# Patient Record
Sex: Male | Born: 1945 | Race: White | Hispanic: No | Smoking: Former smoker
Health system: Southern US, Community
[De-identification: ages and names within clinical notes are randomized; demographics above are authoritative.]

## PROBLEM LIST (undated history)

## (undated) DIAGNOSIS — K649 Unspecified hemorrhoids: Secondary | ICD-10-CM

## (undated) DIAGNOSIS — Z978 Presence of other specified devices: Secondary | ICD-10-CM

## (undated) DIAGNOSIS — E119 Type 2 diabetes mellitus without complications: Secondary | ICD-10-CM

## (undated) DIAGNOSIS — M1990 Unspecified osteoarthritis, unspecified site: Secondary | ICD-10-CM

## (undated) DIAGNOSIS — R059 Cough, unspecified: Secondary | ICD-10-CM

## (undated) DIAGNOSIS — I10 Essential (primary) hypertension: Secondary | ICD-10-CM

## (undated) DIAGNOSIS — M4800 Spinal stenosis, site unspecified: Secondary | ICD-10-CM

## (undated) DIAGNOSIS — N390 Urinary tract infection, site not specified: Secondary | ICD-10-CM

## (undated) DIAGNOSIS — I4891 Unspecified atrial fibrillation: Secondary | ICD-10-CM

## (undated) DIAGNOSIS — R339 Retention of urine, unspecified: Secondary | ICD-10-CM

## (undated) DIAGNOSIS — J449 Chronic obstructive pulmonary disease, unspecified: Secondary | ICD-10-CM

## (undated) DIAGNOSIS — E785 Hyperlipidemia, unspecified: Secondary | ICD-10-CM

## (undated) DIAGNOSIS — J309 Allergic rhinitis, unspecified: Secondary | ICD-10-CM

## (undated) DIAGNOSIS — M19019 Primary osteoarthritis, unspecified shoulder: Secondary | ICD-10-CM

## (undated) DIAGNOSIS — F419 Anxiety disorder, unspecified: Secondary | ICD-10-CM

## (undated) DIAGNOSIS — I480 Paroxysmal atrial fibrillation: Secondary | ICD-10-CM

## (undated) HISTORY — DX: Type 2 diabetes mellitus without complications: E11.9

## (undated) HISTORY — DX: Unspecified osteoarthritis, unspecified site: M19.90

## (undated) HISTORY — DX: Primary osteoarthritis, unspecified shoulder: M19.019

## (undated) HISTORY — PX: BACK SURGERY: SHX140

## (undated) HISTORY — PX: PARTIAL HIP ARTHROPLASTY: SHX733

## (undated) HISTORY — PX: TONSILLECTOMY: SUR1361

## (undated) HISTORY — DX: Cough, unspecified: R05.9

## (undated) HISTORY — DX: Anxiety disorder, unspecified: F41.9

## (undated) HISTORY — PX: HERNIA REPAIR: SHX51

## (undated) HISTORY — DX: Essential (primary) hypertension: I10

## (undated) HISTORY — DX: Hyperlipidemia, unspecified: E78.5

## (undated) HISTORY — DX: Unspecified atrial fibrillation: I48.91

## (undated) HISTORY — DX: Allergic rhinitis, unspecified: J30.9

## (undated) HISTORY — DX: Retention of urine, unspecified: R33.9

## (undated) HISTORY — PX: KNEE SURGERY: SHX244

## (undated) HISTORY — DX: Spinal stenosis, site unspecified: M48.00

## (undated) HISTORY — PX: OTHER SURGICAL HISTORY: SHX169

## (undated) HISTORY — PX: APPENDECTOMY: SHX54

## (undated) HISTORY — DX: Urinary tract infection, site not specified: N39.0

## (undated) HISTORY — DX: Paroxysmal atrial fibrillation: I48.0

## (undated) HISTORY — DX: Presence of other specified devices: Z97.8

## (undated) HISTORY — DX: Unspecified hemorrhoids: K64.9

## (undated) HISTORY — PX: ELBOW SURGERY: SHX618

---

## 2015-04-24 DIAGNOSIS — G8929 Other chronic pain: Secondary | ICD-10-CM | POA: Insufficient documentation

## 2015-04-24 DIAGNOSIS — S66812A Strain of other specified muscles, fascia and tendons at wrist and hand level, left hand, initial encounter: Secondary | ICD-10-CM | POA: Insufficient documentation

## 2015-04-24 DIAGNOSIS — M19032 Primary osteoarthritis, left wrist: Secondary | ICD-10-CM | POA: Insufficient documentation

## 2015-05-31 DIAGNOSIS — G8929 Other chronic pain: Secondary | ICD-10-CM | POA: Insufficient documentation

## 2015-05-31 DIAGNOSIS — I4891 Unspecified atrial fibrillation: Secondary | ICD-10-CM | POA: Insufficient documentation

## 2015-05-31 DIAGNOSIS — J441 Chronic obstructive pulmonary disease with (acute) exacerbation: Secondary | ICD-10-CM | POA: Insufficient documentation

## 2015-05-31 DIAGNOSIS — I10 Essential (primary) hypertension: Secondary | ICD-10-CM | POA: Insufficient documentation

## 2015-05-31 DIAGNOSIS — G894 Chronic pain syndrome: Secondary | ICD-10-CM | POA: Insufficient documentation

## 2015-05-31 DIAGNOSIS — I480 Paroxysmal atrial fibrillation: Secondary | ICD-10-CM | POA: Insufficient documentation

## 2015-05-31 DIAGNOSIS — J449 Chronic obstructive pulmonary disease, unspecified: Secondary | ICD-10-CM | POA: Insufficient documentation

## 2015-06-05 DIAGNOSIS — I340 Nonrheumatic mitral (valve) insufficiency: Secondary | ICD-10-CM | POA: Insufficient documentation

## 2015-06-26 DIAGNOSIS — R269 Unspecified abnormalities of gait and mobility: Secondary | ICD-10-CM | POA: Insufficient documentation

## 2015-07-05 DIAGNOSIS — M7532 Calcific tendinitis of left shoulder: Secondary | ICD-10-CM | POA: Insufficient documentation

## 2015-07-05 DIAGNOSIS — M12812 Other specific arthropathies, not elsewhere classified, left shoulder: Secondary | ICD-10-CM | POA: Insufficient documentation

## 2015-07-05 DIAGNOSIS — M19012 Primary osteoarthritis, left shoulder: Secondary | ICD-10-CM | POA: Insufficient documentation

## 2015-08-03 DIAGNOSIS — G8929 Other chronic pain: Secondary | ICD-10-CM | POA: Insufficient documentation

## 2015-08-03 DIAGNOSIS — M25561 Pain in right knee: Secondary | ICD-10-CM | POA: Insufficient documentation

## 2015-09-28 DIAGNOSIS — M12811 Other specific arthropathies, not elsewhere classified, right shoulder: Secondary | ICD-10-CM | POA: Insufficient documentation

## 2016-01-02 DIAGNOSIS — S66919S Strain of unspecified muscle, fascia and tendon at wrist and hand level, unspecified hand, sequela: Secondary | ICD-10-CM | POA: Insufficient documentation

## 2016-10-25 DIAGNOSIS — Z09 Encounter for follow-up examination after completed treatment for conditions other than malignant neoplasm: Secondary | ICD-10-CM | POA: Insufficient documentation

## 2017-06-19 DIAGNOSIS — M1711 Unilateral primary osteoarthritis, right knee: Secondary | ICD-10-CM | POA: Insufficient documentation

## 2017-08-06 DIAGNOSIS — N401 Enlarged prostate with lower urinary tract symptoms: Secondary | ICD-10-CM | POA: Insufficient documentation

## 2018-07-14 DIAGNOSIS — R339 Retention of urine, unspecified: Secondary | ICD-10-CM | POA: Insufficient documentation

## 2018-10-16 DIAGNOSIS — N3090 Cystitis, unspecified without hematuria: Secondary | ICD-10-CM | POA: Insufficient documentation

## 2019-04-01 ENCOUNTER — Emergency Department
Admission: EM | Admit: 2019-04-01 | Discharge: 2019-04-01 | Disposition: A | Discharge status: Home / Self Care | Payer: No Typology Code available for payment source | Attending: Emergency Medicine | Admitting: Emergency Medicine

## 2019-04-01 ENCOUNTER — Other Ambulatory Visit: Payer: Self-pay

## 2019-04-01 DIAGNOSIS — R339 Retention of urine, unspecified: Secondary | ICD-10-CM | POA: Insufficient documentation

## 2019-04-01 DIAGNOSIS — J449 Chronic obstructive pulmonary disease, unspecified: Secondary | ICD-10-CM | POA: Diagnosis not present

## 2019-04-01 DIAGNOSIS — Z87891 Personal history of nicotine dependence: Secondary | ICD-10-CM | POA: Diagnosis not present

## 2019-04-01 DIAGNOSIS — N3001 Acute cystitis with hematuria: Secondary | ICD-10-CM | POA: Diagnosis not present

## 2019-04-01 HISTORY — DX: Chronic obstructive pulmonary disease, unspecified: J44.9

## 2019-04-01 LAB — URINALYSIS, COMPLETE (UACMP) WITH MICROSCOPIC
Bacteria, UA: NONE SEEN
Bilirubin Urine: NEGATIVE
Glucose, UA: NEGATIVE mg/dL
Ketones, ur: NEGATIVE mg/dL
Nitrite: POSITIVE — AB
Protein, ur: 100 mg/dL — AB
RBC / HPF: >50 RBC/hpf — ABNORMAL HIGH (ref 0–5)
Specific Gravity, Urine: 1.02 (ref 1.005–1.030)
Squamous Epithelial / HPF: NONE SEEN (ref 0–5)
WBC, UA: >50 WBC/hpf — ABNORMAL HIGH (ref 0–5)
pH: 6 (ref 5.0–8.0)

## 2019-04-01 MED ORDER — SULFAMETHOXAZOLE-TRIMETHOPRIM 800-160 MG PO TABS
1.0000 | ORAL_TABLET | Freq: Once | ORAL | Status: AC
  Administered 2019-04-01: 14:00:00 1 via ORAL
  Filled 2019-04-01: qty 1

## 2019-04-01 MED ORDER — SULFAMETHOXAZOLE-TRIMETHOPRIM 800-160 MG PO TABS: 1.0000 | ORAL_TABLET | Freq: Two times a day (BID) | ORAL | 0 refills | Status: DC

## 2019-04-01 NOTE — Discharge Instructions (Signed)
Follow discharge care instruction take medication as directed. °

## 2019-04-01 NOTE — ED Notes (Signed)
Bladder scan with no urine noted in bladder.

## 2019-04-01 NOTE — ED Triage Notes (Signed)
Pt states he has long term foley, urine has been dark lately, seeing some blood in urine. Nurse at [facility] deflated catheter balloon and catheter came out, pt states he is still having some bleeding with urination. Pt is able to urinate.

## 2019-04-01 NOTE — ED Provider Notes (Addendum)
[facility] Emergency Department Provider Note   ____________________________________________   First MD Initiated Contact with Patient 04/01/19 1145     (approximate)  I have reviewed the triage vital signs and the nursing notes.   HISTORY  Chief Complaint Hematuria    HPI Raymond Hays is a 73 y.o. male patient arrived via EMS from [facility] nursing home.  Patient the urine has been dark in the past couple days.  No suspected blood.  Patient was leaking around his Foley and the nurse deflated remove the catheter.  Patient states still having some mild bleeding with urination.  Patient state he was able to void status post catheter removal.  Patient denies pain.      Past Medical History:  Diagnosis Date  . COPD (chronic obstructive pulmonary disease) (HCC)     There are no problems to display for this patient.     Prior to Admission medications   Medication Sig Start Date End Date Taking? Authorizing Provider  sulfamethoxazole-trimethoprim (BACTRIM DS) 800-160 MG tablet Take 1 tablet by mouth 2 (two) times daily. 04/01/19   Joni Reining, PA-C    Allergies Patient has no allergy information on record.  No family history on file.  Social History Social History   Tobacco Use  . Smoking status: Former Smoker  Substance Use Topics  . Alcohol use: Not on file  . Drug use: Not on file    Review of Systems Constitutional: No fever/chills Eyes: No visual changes. ENT: No sore throat. Cardiovascular: Denies chest pain. Respiratory: Denies shortness of breath. Gastrointestinal: No abdominal pain.  No nausea, no vomiting.  No diarrhea.  No constipation. Genitourinary: Hematuria and urinary retention. Musculoskeletal: Negative for back pain. Skin: Negative for rash. Neurological: Negative for headaches, focal weakness or numbness.   ____________________________________________   PHYSICAL EXAM:  VITAL SIGNS: ED Triage Vitals  Enc  Vitals Group     BP 04/01/19 1145 109/68     Pulse Rate 04/01/19 1145 85     Resp 04/01/19 1145 (!) 22     Temp 04/01/19 1145 97.8 F (36.6 C)     Temp Source 04/01/19 1145 Oral     SpO2 04/01/19 1145 100 %     Weight 04/01/19 1146 204 lb (92.5 kg)     Height 04/01/19 1146 5\' 7"  (1.702 m)     Head Circumference --      Peak Flow --      Pain Score 04/01/19 1146 0     Pain Loc --      Pain Edu? --      Excl. in GC? --    Constitutional: Alert and oriented. Well appearing and in no acute distress. Cardiovascular: Normal rate, regular rhythm. Grossly normal heart sounds.  Good peripheral circulation. Respiratory: Normal respiratory effort.  No retractions. Lungs CTAB. Gastrointestinal: Soft and nontender. No distention. No abdominal bruits. No CVA tenderness. Genitourinary: Hematuria. Neurologic:  Normal speech and language. No gross focal neurologic deficits are appreciated. No gait instability. Skin:  Skin is warm, dry and intact. No rash noted. Psychiatric: Mood and affect are normal. Speech and behavior are normal.  ____________________________________________   LABS (all labs ordered are listed, but only abnormal results are displayed)  Labs Reviewed  URINALYSIS, COMPLETE (UACMP) WITH MICROSCOPIC - Abnormal; Notable for the following components:      Result Value   Color, Urine RED (*)    APPearance CLOUDY (*)    Hgb urine dipstick LARGE (*)  Protein, ur 100 (*)    Nitrite POSITIVE (*)    Leukocytes,Ua MODERATE (*)    RBC / HPF >50 (*)    WBC, UA >50 (*)    All other components within normal limits   ____________________________________________  EKG   ____________________________________________  RADIOLOGY  ED MD interpretation:    Official radiology report(s): No results found.  ____________________________________________   PROCEDURES  Procedure(s) performed (including Critical  Care):  Procedures   ____________________________________________   INITIAL IMPRESSION / ASSESSMENT AND PLAN / ED COURSE  As part of my medical decision making, I reviewed the following data within the [facility]      Patient presents with dislodged Foley catheter and hematuria.  Patient normally has catheter changed out every month.  Patient state he believes longer than a month since the last catheter exchange.  Physical exam shows hematuria from the meatus.  Labs are consistent with urinary tract infection.  Patient received new Foley catheter and discharge care instructions.  Take medication as directed.  Advised to follow urology in 5 to 7 days.  Return right ED if condition worsens.    Raymond Hays was evaluated in Emergency Department on 04/01/2019 for the symptoms described in the history of present illness. He was evaluated in the context of the global COVID-19 pandemic, which necessitated consideration that the patient might be at risk for infection with the SARS-CoV-2 virus that causes COVID-19. Institutional protocols and algorithms that pertain to the evaluation of patients at risk for COVID-19 are in a state of rapid change based on information released by regulatory bodies including the CDC and federal and state organizations. These policies and algorithms were followed during the patient's care in the ED.       ____________________________________________   FINAL CLINICAL IMPRESSION(S) / ED DIAGNOSES  Final diagnoses:  Urinary retention  Acute cystitis with hematuria     ED Discharge Orders         Ordered    sulfamethoxazole-trimethoprim (BACTRIM DS) 800-160 MG tablet  2 times daily     04/01/19 1307           Note:  This document was prepared using Dragon voice recognition software and may include unintentional dictation errors.    Sable Feil, PA-C 04/01/19 1314    Sable Feil, PA-C 04/01/19 1315    Duffy Bruce,  MD 04/01/19 2046

## 2019-04-01 NOTE — ED Notes (Signed)
Upon assessment, bright red blood noticed trickling from pt's penis. EDP notified, will wait on foley insertion until further direction from EDP. Pt aware.

## 2019-04-01 NOTE — ED Notes (Signed)
14 french catheter inserted, pt with bleeding from around catheter and blood in urine. Pt states that this has been going on for a while.

## 2019-09-14 ENCOUNTER — Other Ambulatory Visit: Payer: Self-pay

## 2019-09-15 ENCOUNTER — Other Ambulatory Visit: Payer: Self-pay

## 2019-09-15 ENCOUNTER — Ambulatory Visit (INDEPENDENT_AMBULATORY_CARE_PROVIDER_SITE_OTHER): Payer: No Typology Code available for payment source | Admitting: Gastroenterology

## 2019-09-15 ENCOUNTER — Encounter: Payer: Self-pay | Admitting: Gastroenterology

## 2019-09-15 ENCOUNTER — Telehealth: Payer: Self-pay

## 2019-09-15 VITALS — BP 121/82 | HR 102 | Temp 98.1°F | Ht 67.5 in | Wt 225.0 lb

## 2019-09-15 DIAGNOSIS — Z7409 Other reduced mobility: Secondary | ICD-10-CM | POA: Insufficient documentation

## 2019-09-15 DIAGNOSIS — K625 Hemorrhage of anus and rectum: Secondary | ICD-10-CM

## 2019-09-15 DIAGNOSIS — K640 First degree hemorrhoids: Secondary | ICD-10-CM

## 2019-09-15 MED ORDER — HYDROCORTISONE (PERIANAL) 2.5 % EX CREA: 1.0000 "application " | TOPICAL_CREAM | Freq: Two times a day (BID) | CUTANEOUS | 1 refills | Status: DC

## 2019-09-15 NOTE — Telephone Encounter (Signed)
Called patient Facility at [facility] [place] and Gave patient nurse the Prescription for hydrocortisone (ANUSOL-HC) 2.5 % rectal cream  Verbally. Nurse verbalized understanding and repeated the prescription.

## 2019-09-15 NOTE — Progress Notes (Signed)
Arlyss Repress, MD [address]  [address]  [place], [place] [address]  Main: [phone]  Fax: [phone]    Gastroenterology Consultation  Referring Provider:     No ref. provider found Primary Care Physician:  Patient, No Pcp Per Primary Gastroenterologist:  Dr. Arlyss Repress Reason for Consultation:     Rectal bleeding, hemorrhoids        HPI:   Raymond Hays is a 74 y.o. male referred by the [facility], [place] for consultation & management of rectal bleeding, hemorrhoids Patient is a veteran with history of A. fib on Eliquis, metabolic syndrome, COPD, bilateral lower extremity weakness, wheelchair dependent lives at long-term care facility.  Patient reports that he has been experiencing severe burning in his rectum associated with bright red blood per rectum which is scant amount.  Patient also reports that he does not know when he has a bowel movement although he experiences rectal discomfort.  He denies having advised to have a BM.  He is on a stool softener on a regular basis.  He has been using hydrocortisone suppository as needed which provides temporary relief.  He is hoping to find a permanent solution for his hemorrhoid symptoms.  He does have fecal soiling, wears depends.  Due to his sedentary lifestyle, he has developed severe skin breakdown of the bilateral buttocks.  He reports having had a colonoscopy several years ago, report not available  He states he has an appointment to see pulmonologist followed by cardiologist at the [facility]  I do not have any labs available with me  NSAIDs: None  Antiplts/Anticoagulants/Anti thrombotics: Eliquis for history of A. fib  GI Procedures: [facility] at [facility], report not available He reports having had 2 colonoscopies in his life  Past Medical History:  Diagnosis Date  . COPD (chronic obstructive pulmonary disease) (HCC)     History reviewed. No pertinent surgical history.  Current Outpatient  Medications:  .  Albuterol Sulfate (PROAIR RESPICLICK) 108 (90 Base) MCG/ACT AEPB, Inhale into the lungs., Disp: , Rfl:  .  apixaban (ELIQUIS) 5 MG TABS tablet, Take by mouth., Disp: , Rfl:  .  atorvastatin (LIPITOR) 80 MG tablet, Take by mouth., Disp: , Rfl:  .  digoxin (LANOXIN) 0.25 MG tablet, Take by mouth., Disp: , Rfl:  .  finasteride (PROSCAR) 5 MG tablet, Take 5 mg by mouth daily., Disp: , Rfl:  .  Fluticasone-Umeclidin-Vilant (TRELEGY ELLIPTA) 200-62.5-25 MCG/INH AEPB, Inhale into the lungs., Disp: , Rfl:  .  gabapentin (NEURONTIN) 400 MG capsule, Take by mouth., Disp: , Rfl:  .  insulin glargine (LANTUS) 100 UNIT/ML injection, Inject into the skin., Disp: , Rfl:  .  ipratropium (ATROVENT) 0.06 % nasal spray, Place into the nose., Disp: , Rfl:  .  metFORMIN (GLUCOPHAGE-XR) 500 MG 24 hr tablet, Take 500 mg by mouth daily with breakfast., Disp: , Rfl:  .  metoprolol succinate (TOPROL-XL) 100 MG 24 hr tablet, Take by mouth., Disp: , Rfl:  .  simethicone (MYLICON) 80 MG chewable tablet, Chew by mouth., Disp: , Rfl:  .  tamsulosin (FLOMAX) 0.4 MG CAPS capsule, Take 0.4 mg by mouth., Disp: , Rfl:  .  traMADol (ULTRAM) 50 MG tablet, Take by mouth every 6 (six) hours as needed., Disp: , Rfl:  .  traZODone (DESYREL) 100 MG tablet, Take 100 mg by mouth at bedtime., Disp: , Rfl:  .  ascorbic acid (VITAMIN C) 500 MG tablet, Take by mouth. (Patient not taking: Reported  on 09/15/2019), Disp: , Rfl:  .  calcium-vitamin D (OSCAL WITH D) 500-200 MG-UNIT TABS tablet, Take by mouth. (Patient not taking: Reported on 09/15/2019), Disp: , Rfl:  .  cetirizine (ZYRTEC) 10 MG tablet, Take by mouth. (Patient not taking: Reported on 09/15/2019), Disp: , Rfl:  .  diclofenac Sodium (VOLTAREN) 1 % GEL, Place onto the skin. (Patient not taking: Reported on 09/15/2019), Disp: , Rfl:  .  hydrocortisone (ANUSOL-HC) 2.5 % rectal cream, Place 1 application rectally 2 (two) times daily., Disp: 30 g, Rfl: 1 .  hydrocortisone  cream 0.5 %, Apply topically., Disp: , Rfl:  .  methocarbamol (ROBAXIN) 750 MG tablet, Take by mouth., Disp: , Rfl:  .  nystatin (MYCOSTATIN/NYSTOP) powder, , Disp: , Rfl:  .  omeprazole (PRILOSEC) 20 MG capsule, Take by mouth., Disp: , Rfl:  .  pantoprazole (PROTONIX) 40 MG tablet, Take by mouth., Disp: , Rfl:  .  polyethylene glycol powder (GLYCOLAX/MIRALAX) 17 GM/SCOOP powder, Take by mouth., Disp: , Rfl:  .  promethazine (PHENERGAN) 25 MG tablet, Take by mouth., Disp: , Rfl:  .  senna-docusate (SENOKOT-S) 8.6-50 MG tablet, Take by mouth., Disp: , Rfl:  .  sildenafil (VIAGRA) 100 MG tablet, Take by mouth., Disp: , Rfl:  .  sucralfate (CARAFATE) 1 g tablet, Take by mouth., Disp: , Rfl:  .  sulfamethoxazole-trimethoprim (BACTRIM DS) 800-160 MG tablet, Take 1 tablet by mouth 2 (two) times daily., Disp: 20 tablet, Rfl: 0   History reviewed. No pertinent family history.   Social History   Tobacco Use  . Smoking status: Former Research scientist (life sciences)  . Smokeless tobacco: Never Used  Substance Use Topics  . Alcohol use: Not Currently  . Drug use: Never    Allergies as of 09/15/2019  . (No Known Allergies)    Review of Systems:    All systems reviewed and negative except where noted in HPI.   Physical Exam:  BP 121/82 (BP Location: Left Arm, Patient Position: Sitting, Cuff Size: Normal)   Pulse (!) 102   Temp 98.1 F (36.7 C) (Oral)   Ht 5' 7.5" (1.715 m)   Wt 225 lb (102.1 kg) Comment: in a wheel chair the patient states he weighs this  BMI 34.72 kg/m  No LMP for male patient.  General:   Alert,  Well-developed, well-nourished, pleasant and cooperative in NAD Head:  Normocephalic and atraumatic. Eyes:  Sclera clear, no icterus.   Conjunctiva pink. Ears:  Normal auditory acuity. Nose:  No deformity, discharge, or lesions. Mouth:  No deformity or lesions,oropharynx pink & moist. Neck:  Supple; no masses or thyromegaly. Lungs:  Respirations even and unlabored.  Exertional dyspnea,  bilateral crackles Heart: Increased rate and irregular rhythm; no murmurs, clicks, rubs, or gallops. Abdomen:  Normal bowel sounds. Soft, non-tender and non-distended without masses, hepatosplenomegaly or hernias noted.  No guarding or rebound tenderness.   Rectal: Erythematous skin rash on buttocks with breakdown of the skin as well as perianal region. Decreased sphincter tone. Formed stool felt in the anal canal as well as rectal vault, stool mixed with blood Msk: Bilateral upper and lower extremity weakness Pulses: Increased pulse rate Extremities:  No clubbing or edema.  No cyanosis. Neurologic:  Alert and oriented x3;  grossly normal neurologically. Psych:  Alert and cooperative. Normal mood and affect.  Imaging Studies: Not available  Assessment and Plan:   Raymond Hays is a 74 y.o. male veteran with metabolic syndrome, COPD, A. fib on Eliquis is seen in consultation for  chronic history of rectal bleeding, rectal pain/burning, discomfort. His symptoms are likely combination of symptomatic external hemorrhoids and breakdown of the perianal skin  Discussed with him about limited treatment options given his sedentary lifestyle Trial of Anusol cream 2 times daily per rectum Avoid constipation Keep the perianal skin dry Will need to obtain clearance from cardiology for interruption of anticoagulation before and after performing band ligation His anal canal and rectal vault needs to be empty in order to perform hemorrhoid ligation We need to obtain previous colonoscopy report   Follow up in 1 to 2 months   Arlyss Repress, MD

## 2019-09-16 ENCOUNTER — Telehealth: Payer: Self-pay

## 2019-09-16 NOTE — Telephone Encounter (Signed)
[facility] [place] to get [facility] reports [facility] to [place]. Had to leave a message for call back for nurse.

## 2019-09-22 ENCOUNTER — Telehealth: Payer: Self-pay

## 2019-09-22 NOTE — Telephone Encounter (Signed)
Tonya left a voicemail stating that she would like a phone call back to schedule patient's procedure.

## 2019-09-23 ENCOUNTER — Telehealth: Payer: Self-pay

## 2019-09-23 NOTE — Telephone Encounter (Signed)
Per Dr. Allegra Lai she said that we need to get is records from the [place] which we have tried several times. She states that until we get his records we can not do anything. He does have a option if we refer him to a cardiologist in [facility] and then get blood thinner clearance. We can not do banding procedure till we have a blood thinner clearance. Called patient and patient verbalized understanding. He states that he saw his PCP yesterday at the [place] and they referred him to pulmonologist and cardiologist. He states he will see them and then see [place] for his follow up appointment

## 2019-09-23 NOTE — Telephone Encounter (Signed)
[facility] and left a message for call back

## 2019-09-23 NOTE — Telephone Encounter (Signed)
Patient has a follow up appointment with you on 12/15/2019. Patient states he can not wait till then because he has hemorrhoids flare up every 2 weeks where he is unable to get out of bed. He states that he has bleeding a severe rectal pain with it. Patient wants to know if he could do the banding procedure or recommend something for him. He is on Eliquis 5mg  do not know who prescribes this for him can not find it in his chart

## 2019-09-23 NOTE — Telephone Encounter (Signed)
I talked to patient on 09/21/2019 and informed patient that the provider wanted him to just wait till his follow up to do any procedures at this time. Patient verbalized understanding

## 2019-10-13 NOTE — Telephone Encounter (Signed)
[facility] to [facility] to [place]. Left a message for call back

## 2019-11-04 ENCOUNTER — Ambulatory Visit (INDEPENDENT_AMBULATORY_CARE_PROVIDER_SITE_OTHER): Payer: No Typology Code available for payment source | Admitting: Pulmonary Disease

## 2019-11-04 ENCOUNTER — Encounter: Payer: Self-pay | Admitting: Pulmonary Disease

## 2019-11-04 ENCOUNTER — Other Ambulatory Visit: Payer: Self-pay

## 2019-11-04 VITALS — BP 130/76 | HR 101 | Temp 97.7°F | Ht 67.5 in | Wt 234.0 lb

## 2019-11-04 DIAGNOSIS — J449 Chronic obstructive pulmonary disease, unspecified: Secondary | ICD-10-CM | POA: Diagnosis not present

## 2019-11-04 DIAGNOSIS — J984 Other disorders of lung: Secondary | ICD-10-CM

## 2019-11-04 DIAGNOSIS — E669 Obesity, unspecified: Secondary | ICD-10-CM

## 2019-11-04 DIAGNOSIS — R0600 Dyspnea, unspecified: Secondary | ICD-10-CM | POA: Diagnosis not present

## 2019-11-04 DIAGNOSIS — R29898 Other symptoms and signs involving the musculoskeletal system: Secondary | ICD-10-CM

## 2019-11-04 DIAGNOSIS — R0609 Other forms of dyspnea: Secondary | ICD-10-CM

## 2019-11-04 DIAGNOSIS — I340 Nonrheumatic mitral (valve) insufficiency: Secondary | ICD-10-CM

## 2019-11-04 MED ORDER — SPACER/AERO-HOLDING CHAMBERS DEVI: 0 refills | Status: DC

## 2019-11-04 MED ORDER — BREZTRI AEROSPHERE 160-9-4.8 MCG/ACT IN AERO: 2.0000 | INHALATION_SPRAY | Freq: Two times a day (BID) | RESPIRATORY_TRACT | 0 refills | Status: AC

## 2019-11-04 NOTE — Progress Notes (Signed)
 Subjective:    Patient ID: Raymond Hays, male    DOB: 07/15/1945, 73 y.o.   MRN: [id number]  HPI Patient is a 73-year-old remote former smoker who presents for evaluation of dyspnea.  He is referred [facility].  The patient had COVID-19 in April 2020 and since then has noted increasing issues with dyspnea.  He presents as a second opinion with regards to pulmonary issues for dyspnea.  [facility] [place] [facility].  He has noted cough that at times is "violent".  Sputum is thick sometimes hard to produce.  No significant color to it.  No hemoptysis.  Intermittent nonspecific chest pain, none recently.  The patient had pulmonary function testing performed in April 2021 showing combined restrictive and obstructive physiology diffusion capacity was moderately reduced.  It appears restrictive physiology due to obesity as his ERV is 14%.  His inspiratory and expiratory mouth pressures during the test were consistent with muscle weakness.  He is currently not using inhalers, apparently has tried Trelegy without help.  He notes that really "everything" makes his symptoms worse and "nothing" helps.  He appears significantly deconditioned he does have spinal stenosis which adds to his debility.   [facility] that shows moderate to severe mitral regurgitation by the direction of the mitral regurgitant jet it was consistent with posterior leaflet pathology.  He informs me that he is to see cardiology next.  Patient has been using oxygen with exertion however per prior records it does not appear that he has met criteria.  He cannot perform ambulatory oximetry today as he did not bring his walker and he is in a wheelchair.  Review of Systems A 10 point review of systems was performed and it is as noted above otherwise negative.  Past Medical History:  Diagnosis Date  . Allergic rhinitis    . Anxiety   . Arthritis   . Chronic indwelling Foley catheter   . COPD (chronic obstructive pulmonary disease) (HCC)   . Cough   . Diabetes (HCC)   . Hemorrhoid   . Hyperlipidemia   . PAF (paroxysmal atrial fibrillation) (HCC)   . Spinal stenosis   . UTI (urinary tract infection)    Past Surgical History:  Procedure Laterality Date  . APPENDECTOMY    . BACK SURGERY    . ELBOW SURGERY    . HERNIA REPAIR    . KNEE SURGERY    . neck    . TONSILLECTOMY     Family History  Family history unknown: Yes   Social History   Tobacco Use  . Smoking status: Former Smoker    Packs/day: 0.50    Years: 10.00    Pack years: 5.00    Types: Cigarettes    Quit date: 2006    Years since quitting: 15.6  . Smokeless tobacco: Never Used  Substance Use Topics  . Alcohol use: Not Currently   Service-connected veteran  No Known Allergies  Current Meds  Medication Sig  . Albuterol Sulfate (PROAIR RESPICLICK) 108 (90 Base) MCG/ACT AEPB Inhale into the lungs.  . apixaban (ELIQUIS) 5 MG TABS tablet Take by mouth.  . ascorbic acid (VITAMIN C) 500 MG tablet Take by mouth.   . atorvastatin (LIPITOR) 80 MG tablet Take by mouth.  . calcium-vitamin D (OSCAL WITH D) 500-200 MG-UNIT TABS tablet Take by mouth.   . cetirizine (  ZYRTEC) 10 MG tablet Take by mouth.   . diclofenac Sodium (VOLTAREN) 1 % GEL Place onto the skin.   . digoxin (LANOXIN) 0.25 MG tablet Take by mouth.  . finasteride (PROSCAR) 5 MG tablet Take 5 mg by mouth daily.  . gabapentin (NEURONTIN) 400 MG capsule Take by mouth.  . hydrocortisone (ANUSOL-HC) 2.5 % rectal cream Place 1 application rectally 2 (two) times daily.  . hydrocortisone cream 0.5 % Apply topically.  . insulin glargine (LANTUS) 100 UNIT/ML injection Inject into the skin.  . ipratropium (ATROVENT) 0.06 % nasal spray Place into the nose.  . metFORMIN (GLUCOPHAGE-XR) 500 MG 24 hr tablet Take 500 mg by mouth daily with breakfast.  . methocarbamol (ROBAXIN) 750 MG  tablet Take by mouth.  . metoprolol succinate (TOPROL-XL) 100 MG 24 hr tablet Take by mouth.  . nystatin (MYCOSTATIN/NYSTOP) powder   . omeprazole (PRILOSEC) 20 MG capsule Take by mouth.  . pantoprazole (PROTONIX) 40 MG tablet Take by mouth.  . polyethylene glycol powder (GLYCOLAX/MIRALAX) 17 GM/SCOOP powder Take by mouth.  . promethazine (PHENERGAN) 25 MG tablet Take by mouth.  . senna-docusate (SENOKOT-S) 8.6-50 MG tablet Take by mouth.  . sildenafil (VIAGRA) 100 MG tablet Take by mouth.  . simethicone (MYLICON) 80 MG chewable tablet Chew by mouth.  . sucralfate (CARAFATE) 1 g tablet Take by mouth.  . sulfamethoxazole-trimethoprim (BACTRIM DS) 800-160 MG tablet Take 1 tablet by mouth 2 (two) times daily.  . tamsulosin (FLOMAX) 0.4 MG CAPS capsule Take 0.4 mg by mouth.  . traMADol (ULTRAM) 50 MG tablet Take by mouth every 6 (six) hours as needed.  . traZODone (DESYREL) 100 MG tablet Take 100 mg by mouth at bedtime.   Immunization: There is evidence of Moderna mRNA vaccine on 04/06/2019 and 05/04/2019   Other immunizations [facility] records.  Objective:   Physical Exam BP 130/76 (BP Location: Left Arm, Cuff Size: Normal)   Pulse (!) 101   Temp 97.7 F (36.5 C) (Temporal)   Ht 5' 7.5" (1.715 m)   Wt 234 lb (106.1 kg) Comment: per patient  SpO2 97%   BMI 36.11 kg/m  GENERAL: Obese gentleman, presents in wheelchair.  Obviously debilitated.  Cantankerous.  No use of accessories. HEAD: Normocephalic, atraumatic.  EYES: Pupils equal, round, reactive to light.  No scleral icterus.  MOUTH: Nose/mouth/throat not examined due to masking requirements for COVID 19. NECK: Supple. No thyromegaly. Trachea midline. No JVD.  No adenopathy. PULMONARY: Symmetrical air entry, coarse breath sounds with no other adventitious sounds. CARDIOVASCULAR: S1 and S2.  Tachycardic with regular rhythm.  There is a grade 3/6 mitral regurgitation murmur present.  No gallop. ABDOMEN: Protuberant, soft, nondistended. GU:  Chronic Foley in place. MUSCULOSKELETAL: No joint deformity, decreased muscle mass lower extremities.  No clubbing, there is +2 edema of the lower extremities.  NEUROLOGIC: Lower extremity weakness due to spinal stenosis.  No other focal sign.  Fluent speech.  Awake, alert. SKIN: Intact,warm,dry.  Limited exam no rashes. PSYCH: Cantankerous and short tempered, behavior normal.   Available [facility] records reviewed.  In particular recent 2D echo reviewed.    Assessment & Plan:     ICD-10-CM   1. DOE (dyspnea on exertion)  R06.00    Multifactorial Main component of debility and deconditioning Concern with regards to moderate to severe mitral regurgitation Minor element of COPD   2. Mitral valve insufficiency, unspecified etiology  I34.0    Patient needs to have this addressed Loud murmur evident Echocardiogram shows significant   mitral abnormality  3. COPD with chronic bronchitis and emphysema ([place])  J44.9    Element of obstructive lung disease Trial of Breztri 2 inhalations twice a day Restrictive physiology likely due to obesity  4. Restrictive lung disease secondary to obesity  J98.4    E66.9    Restrictive physiology likely due to obesity This issue adds complexity to his management  5. Severe muscle deconditioning  R29.898    Severe debility and muscle deconditioning This adds to his sensation of dyspnea This issue adds complexity to his management   Meds ordered this encounter  Medications  . Budeson-Glycopyrrol-Formoterol (BREZTRI AEROSPHERE) 160-9-4.8 MCG/ACT AERO    Sig: Inhale 2 puffs into the lungs in the morning and at bedtime for 1 day.    Dispense:  4.8 g    Refill:  0    Order Specific Question:   Lot Number?    Answer:   [id number]    Order Specific Question:   Expiration Date?    Answer:   03/01/2021    Order Specific Question:   Manufacturer?    Answer:   [employer] [[id number]]    Order Specific Question:   Quantity    Answer:   2  . Spacer/Aero-Holding [employer] DEVI     Sig: Use as directed    Dispense:  1 each    Refill:  0   Discussion: Patient dyspnea appears to be multifactorial, I suspect the patient may require cardiac catheterization to assess the severity of his mitral regurgitation which I think is the main component at present.  Another large component of his dyspnea is due to debility and deconditioning.  Obesity also adds to the problem and creates a restrictive physiology on his pulmonary function testing.  He does have an element of obstructive lung disease this is hard to assess severity due to the concomitant restrictive component.  We will give him a trial of Breztri 2 inhalations twice a day (he did not feel any improvement with Trelegy).  He was provided a spacer to help with the dosing of Breztri.  He is to let us know how this works for him.  He is currently using oxygen however I do not see the adequate studies to support use of this.  Ambulatory oximetry could not be performed today in part because the patient showed 1 hour late to his appointment and in part because the patient came and wheelchair without his assistive devices to be able to support him during walk.  We will see the patient in 6 to 8 weeks time he is to contact us prior to that time should any new difficulties arise.  Total visit time 65 minutes.   Renold Don, MD [facility]   *This note was dictated using voice recognition software/Dragon.  Despite best efforts to proofread, errors can occur which can change the meaning.  Any change was purely unintentional.

## 2019-11-04 NOTE — Patient Instructions (Signed)
I think your shortness of breath is due to your heart.  You have a very leaky valve which is likely the culprit.  Keep your appointment with a cardiologist.  I suspect they are going to have to do something called a cardiac catheterization to determine what is going on with your heart.  Your lung function shows that you do not take deep breaths this is likely due to muscle weakness and deconditioning.  Your content of oxygen in the blood was okay.  You also tell me that when you are doing physical therapy they never note that there is a problem with your oxygen.  This indicates that the likely issues with your shortness of breath are due to your heart and deconditioning.  Because of the increased congestion you are having we are going to give you a trial of an inhaler called Breztri to see if this helps.  This is 2 puffs twice a day.  We have provided spacer to help you get the medication into your lungs.  Let us know if this medication helps so that we can call it into your pharmacy.   We will see you in follow-up in 6 to 8 weeks time.  All sooner should any new problems arise.

## 2019-11-11 ENCOUNTER — Encounter: Payer: Self-pay | Admitting: Pulmonary Disease

## 2019-11-11 ENCOUNTER — Telehealth: Payer: Self-pay | Admitting: Pulmonary Disease

## 2019-11-11 NOTE — Telephone Encounter (Signed)
Pam with [facility] is requesting last OV note to be faxed to [phone] or [phone]. Note is not completed.   Dr. Jayme Cloud, please let me know once note is completed so it can be faxed. Thanks

## 2019-11-11 NOTE — Telephone Encounter (Signed)
Completed.

## 2019-11-12 NOTE — Telephone Encounter (Signed)
Ov note has been faxed to provided fax number.  Received fax confirmation.  Nothing further is needed.

## 2019-11-14 ENCOUNTER — Other Ambulatory Visit
Admission: RE | Admit: 2019-11-14 | Discharge: 2019-11-14 | Disposition: A | Discharge status: Home / Self Care | Payer: No Typology Code available for payment source | Source: Ambulatory Visit | Attending: Family Medicine | Admitting: Family Medicine

## 2019-11-14 DIAGNOSIS — R0602 Shortness of breath: Secondary | ICD-10-CM | POA: Diagnosis present

## 2019-11-14 LAB — CBC WITH DIFFERENTIAL/PLATELET
Abs Immature Granulocytes: 0.05 10*3/uL (ref 0.00–0.07)
Basophils Absolute: 0.1 10*3/uL (ref 0.0–0.1)
Basophils Relative: 1 %
Eosinophils Absolute: 0.4 10*3/uL (ref 0.0–0.5)
Eosinophils Relative: 5 %
HCT: 42.7 % (ref 39.0–52.0)
Hemoglobin: 14.2 g/dL (ref 13.0–17.0)
Immature Granulocytes: 1 %
Lymphocytes Relative: 21 %
Lymphs Abs: 1.7 10*3/uL (ref 0.7–4.0)
MCH: 29.6 pg (ref 26.0–34.0)
MCHC: 33.3 g/dL (ref 30.0–36.0)
MCV: 89 fL (ref 80.0–100.0)
Monocytes Absolute: 0.5 10*3/uL (ref 0.1–1.0)
Monocytes Relative: 7 %
Neutro Abs: 5.4 10*3/uL (ref 1.7–7.7)
Neutrophils Relative %: 65 %
Platelets: 239 10*3/uL (ref 150–400)
RBC: 4.8 MIL/uL (ref 4.22–5.81)
RDW: 14.8 % (ref 11.5–15.5)
WBC: 8.2 10*3/uL (ref 4.0–10.5)
nRBC: 0 % (ref 0.0–0.2)

## 2019-11-14 LAB — BASIC METABOLIC PANEL
Anion gap: 10 (ref 5–15)
BUN: 27 mg/dL — ABNORMAL HIGH (ref 8–23)
CO2: 27 mmol/L (ref 22–32)
Calcium: 9.2 mg/dL (ref 8.9–10.3)
Chloride: 103 mmol/L (ref 98–111)
Creatinine, Ser: 0.95 mg/dL (ref 0.61–1.24)
GFR calc Af Amer: >60 mL/min (ref >60)
GFR calc non Af Amer: >60 mL/min (ref >60)
Glucose, Bld: 108 mg/dL — ABNORMAL HIGH (ref 70–99)
Potassium: 4.2 mmol/L (ref 3.5–5.1)
Sodium: 140 mmol/L (ref 135–145)

## 2019-11-24 ENCOUNTER — Encounter: Payer: Self-pay | Admitting: Neurology

## 2019-12-02 NOTE — Progress Notes (Signed)
Assessment/Plan:    1.  Parkinsonism  -doesn't meet full MDS/UK brain bank criteria but part of that is because he is disabled from other means and cannot adequately eval gait and even RAMs in the LUE due to prior disability.  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  Pt agrees  -pt to start carbidopa/levodopa 25/100 and work to 1 tablet at 7am/11am/4pm.  R/B/SE were discussed.  The opportunity to ask questions was given and they were answered to the best of my ability.  The patient expressed understanding and willingness to follow the outlined treatment protocols.   Subjective:   Raymond Hays was seen today in the movement disorders clinic for neurologic consultation at the request of Administration, Veterans.  The consultation is for the evaluation of L hand rest tremor.  Pt unaccompanied to the visit.  Lives at [facility] in [place], [place]   Specific Symptoms:  Tremor: Yes.  , started L hand.  None in L leg.  More recently R hand but mostly on the L.  Mostly with rest (watching TV).  He is R hand dominant.   Family hx of similar:  No., mother had MS Voice: no changes Sleep: cough disrupts sleep.  Has post covid cough  Vivid Dreams:  Yes.    Acting out dreams:  Yes.  , some sleep talking Wet Pillows: No. Postural symptoms:  Uses WC 90% of the time and did even before he got covid.  Has had chronic L foot drop since back surgery years ago and then "right leg got weak."  States that WC bound x few years from back  Falls?  Yes.   - is why he is in SNF - was falling at home - in PT now Bradykinesia symptoms: shuffling gait and slow movements Loss of smell:  No. Loss of taste:  No. Urinary Incontinence: indwelling foley x 1+ year Difficulty Swallowing:  No. Handwriting, micrographia: No. Trouble with ADL's:  Yes.  , CNA dresses/bathes patient  Trouble buttoning clothing: Yes.   Memory changes:  Yes.  , some  word finding trouble and trouble with short term memory; he has good long term memory N/V:  No. Lightheaded:  Yes.   when first sits up in the AM Syncope: No. Diplopia:  No. Dyskinesia:  No. Prior exposure to reglan/antipsychotics: No.  Neuroimaging of the brain has not previously been performed that he can recall.    ALLERGIES:  No Known Allergies  CURRENT MEDICATIONS:  Current Outpatient Medications  Medication Instructions  . acetaminophen (TYLENOL) 650 mg, Oral, Every 4 hours PRN  . Albuterol Sulfate (PROAIR RESPICLICK) 108 (90 Base) MCG/ACT AEPB Inhalation  . apixaban (ELIQUIS) 5 mg, Oral, 2 times daily  . ascorbic acid (VITAMIN C) 500 MG tablet Oral  . benzonatate (TESSALON) 100 MG capsule Oral, 2 times daily PRN  . bisacodyl (DULCOLAX) 10 mg, Rectal, As needed  . calcium carbonate (TUMS - DOSED IN MG ELEMENTAL CALCIUM) 500 MG chewable tablet 2 tablets, Oral, Every 6 hours PRN  . carboxymethylcellulose (REFRESH PLUS) 0.5 % SOLN 1 drop, As needed  . cetirizine (ZYRTEC) 10 mg, Oral, Daily  . diphenhydrAMINE-zinc acetate (BENADRYL ITCH STOPPING) cream Topical, Every 4 hours  . Docusate Sodium (STOOL SOFTENER LAXATIVE PO) 1 tablet, Oral, 2 times daily  . Emollient (CETAPHIL) cream 1 application, Topical, As needed  . Emollient (EUCERIN DAILY PROTECTION/SPF30) LOTN Apply externally, As needed  .  escitalopram (LEXAPRO) 5 mg, Oral, Daily  . finasteride (PROSCAR) 5 mg, Oral, Daily  . fluticasone (FLONASE) 50 MCG/ACT nasal spray 1 spray, Each Nare, Daily  . Fluticasone-Umeclidin-Vilant (TRELEGY ELLIPTA) 100-62.5-25 MCG/INH AEPB 1 puff, Inhalation, Daily  . gabapentin (NEURONTIN) 800 mg, Oral, 2 times daily  . guaiFENesin (MUCINEX) 600 mg, Oral, Every 12 hours  . hydrocortisone (ANUSOL-HC) 2.5 % rectal cream 1 application, Rectal, 2 times daily  . hydrocortisone cream 0.5 % Topical  . ibuprofen (ADVIL) 200 mg, Oral, 2 times daily PRN  . ipratropium (ATROVENT) 0.06 % nasal spray  Nasal  . lactase (LACTAID) 3,000 Units, Oral, 3 times daily with meals  . Lidocaine 3 % CREA Apply externally, Daily PRN  . lisinopril (ZESTRIL) 2.5 mg, Oral, Daily  . loperamide (IMODIUM) 4 mg, Oral, As needed  . metFORMIN (GLUCOPHAGE-XR) 500 mg, Oral, Daily  . methocarbamol (ROBAXIN) 1,000 mg, Oral, 2 times daily  . metoprolol succinate (TOPROL-XL) 50 mg, Oral, Daily  . montelukast (SINGULAIR) 10 mg, Oral, Daily at bedtime  . nystatin (MYCOSTATIN/NYSTOP) powder No dose, route, or frequency recorded.  . psyllium (METAMUCIL) 58.6 % powder 1 packet, Oral, Daily  . senna-docusate (SENOKOT-S) 8.6-50 MG tablet Oral  . Skin Protectants, Misc. (MINERIN CREME EX) Apply externally, Apply to feet twice bid   . tamsulosin (FLOMAX) 0.4 mg, Oral  . torsemide (DEMADEX) 10 mg, Oral, Daily  . traMADol (ULTRAM) 100 mg, Oral, Every morning  . traZODone (DESYREL) 100 mg, Oral, Daily at bedtime  . triamcinolone cream (KENALOG) 0.1 % 1 application, Topical, 2 times daily    Objective:   VITALS:   Vitals:   12/07/19 1339  BP: 111/66  Pulse: 91  SpO2: 96%  Height: 5' 7.5" (1.715 m)    GEN:  The patient appears stated age and is in NAD. HEENT:  Normocephalic, atraumatic.  The mucous membranes are moist. The superficial temporal arteries are without ropiness or tenderness. CV:  RRR Lungs:  CTAB.  Excessive DOE.  Coughing during much of hx (states like this since got covid 1.5 years ago) Neck/HEME:  There are no carotid bruits bilaterally.  Neurological examination:  Orientation: The patient is alert and oriented x3.  Cranial nerves: There is good facial symmetry. Extraocular muscles are intact. The visual fields are full to confrontational testing. The speech is fluent and clear. Soft palate rises symmetrically and there is no tongue deviation. Hearing is intact to conversational tone. Sensation: Sensation is intact to light and pinprick throughout (facial, trunk, extremities). Vibration is absent  at the bilateral big toe and ankle. There is no extinction with double simultaneous stimulation. There is no sensory dermatomal level identified. Motor: Pt unable to abduct the shoulders beyond 75 degrees due to pain (rotator cuff issues).  Has 5/5 strength in the UE.  Fingers of the L hand (digits 3-5) chronically flexed and don't open.  LE strength at least 4/5.  In AFO for L foot drop and cannot dorsiflex L ankle. Deep tendon reflexes: Deep tendon reflexes are 0-1/4 at the bilateral biceps, triceps, brachioradialis, patella and achilles. Plantar responses are downgoing bilaterally.  Movement examination: Tone: There is nl tone in the bilateral upper extremities.  The tone in the lower extremities is nl.  Abnormal movements: there is LUE rest tremor Coordination:  There is ? decremation with RAM's, but unable to assess well in the LUE due to wrist fusion and fingers don't open  Gait and Station: The patient requires assist out of the WC.  Given  a walker.  Has L Foot drop (in AFO).  Slow to walk and DOE very much limits it.  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 I have reviewed and interpreted the following labs independently   Chemistry      Component Value Date/Time   NA 140 11/14/2019 1100   K 4.2 11/14/2019 1100   CL 103 11/14/2019 1100   CO2 27 11/14/2019 1100   BUN 27 (H) 11/14/2019 1100   CREATININE 0.95 11/14/2019 1100      Component Value Date/Time   CALCIUM 9.2 11/14/2019 1100      No results found for: TSH Lab Results  Component Value Date   WBC 8.2 11/14/2019   HGB 14.2 11/14/2019   HCT 42.7 11/14/2019   MCV 89.0 11/14/2019   PLT 239 11/14/2019     Total time spent on today's visit was 60 minutes, including both face-to-face time and nonface-to-face time.  Time included that spent on review of records (prior notes available to me/labs/imaging if pertinent), discussing treatment and goals, answering patient's questions and coordinating care.  [facility]

## 2019-12-07 ENCOUNTER — Ambulatory Visit (INDEPENDENT_AMBULATORY_CARE_PROVIDER_SITE_OTHER): Payer: No Typology Code available for payment source | Admitting: Neurology

## 2019-12-07 ENCOUNTER — Encounter: Payer: Self-pay | Admitting: Neurology

## 2019-12-07 ENCOUNTER — Other Ambulatory Visit: Payer: Self-pay

## 2019-12-07 VITALS — BP 111/66 | HR 91 | Ht 67.5 in

## 2019-12-07 DIAGNOSIS — M21372 Foot drop, left foot: Secondary | ICD-10-CM

## 2019-12-07 DIAGNOSIS — G20 Parkinson's disease: Secondary | ICD-10-CM | POA: Diagnosis not present

## 2019-12-07 MED ORDER — CARBIDOPA-LEVODOPA 25-100 MG PO TABS: 1.0000 | ORAL_TABLET | Freq: Three times a day (TID) | ORAL | 1 refills | Status: DC

## 2019-12-07 NOTE — Patient Instructions (Signed)
Start Carbidopa Levodopa as follows: Take 1/2 tablet three times daily, at least 30 minutes before meals (approximately 7am/11am/4pm), for one week Then take 1/2 tablet in the morning, 1/2 tablet in the afternoon, 1 tablet in the evening, at least 30 minutes before meals, for one week Then take 1/2 tablet in the morning, 1 tablet in the afternoon, 1 tablet in the evening, at least 30 minutes before meals, for one week Then take 1 tablet three times daily at 7am/11am/4pm, at least 30 minutes before meals   As a reminder, carbidopa/levodopa can be taken at the same time as a carbohydrate, but we like to have you take your pill either 30 minutes before a protein source or 1 hour after as protein can interfere with carbidopa/levodopa absorption.  

## 2019-12-15 ENCOUNTER — Encounter: Payer: Self-pay | Admitting: Intensive Care

## 2019-12-15 ENCOUNTER — Ambulatory Visit: Payer: No Typology Code available for payment source | Admitting: Gastroenterology

## 2019-12-15 ENCOUNTER — Other Ambulatory Visit: Payer: Self-pay

## 2019-12-15 DIAGNOSIS — Z96643 Presence of artificial hip joint, bilateral: Secondary | ICD-10-CM | POA: Diagnosis not present

## 2019-12-15 DIAGNOSIS — Z79899 Other long term (current) drug therapy: Secondary | ICD-10-CM | POA: Insufficient documentation

## 2019-12-15 DIAGNOSIS — I10 Essential (primary) hypertension: Secondary | ICD-10-CM | POA: Diagnosis not present

## 2019-12-15 DIAGNOSIS — N179 Acute kidney failure, unspecified: Secondary | ICD-10-CM | POA: Diagnosis not present

## 2019-12-15 DIAGNOSIS — Z7951 Long term (current) use of inhaled steroids: Secondary | ICD-10-CM | POA: Diagnosis not present

## 2019-12-15 DIAGNOSIS — Z87891 Personal history of nicotine dependence: Secondary | ICD-10-CM | POA: Diagnosis not present

## 2019-12-15 DIAGNOSIS — Z7984 Long term (current) use of oral hypoglycemic drugs: Secondary | ICD-10-CM | POA: Diagnosis not present

## 2019-12-15 DIAGNOSIS — R944 Abnormal results of kidney function studies: Secondary | ICD-10-CM | POA: Diagnosis present

## 2019-12-15 DIAGNOSIS — Z8616 Personal history of COVID-19: Secondary | ICD-10-CM | POA: Diagnosis not present

## 2019-12-15 DIAGNOSIS — J449 Chronic obstructive pulmonary disease, unspecified: Secondary | ICD-10-CM | POA: Insufficient documentation

## 2019-12-15 DIAGNOSIS — T83511A Infection and inflammatory reaction due to indwelling urethral catheter, initial encounter: Secondary | ICD-10-CM | POA: Diagnosis not present

## 2019-12-15 DIAGNOSIS — Z7901 Long term (current) use of anticoagulants: Secondary | ICD-10-CM | POA: Diagnosis not present

## 2019-12-15 DIAGNOSIS — E119 Type 2 diabetes mellitus without complications: Secondary | ICD-10-CM | POA: Insufficient documentation

## 2019-12-15 LAB — COMPREHENSIVE METABOLIC PANEL
ALT: 7 U/L (ref 0–44)
AST: 17 U/L (ref 15–41)
Albumin: 3.8 g/dL (ref 3.5–5.0)
Alkaline Phosphatase: 94 U/L (ref 38–126)
Anion gap: 9 (ref 5–15)
BUN: 38 mg/dL — ABNORMAL HIGH (ref 8–23)
CO2: 25 mmol/L (ref 22–32)
Calcium: 9.2 mg/dL (ref 8.9–10.3)
Chloride: 101 mmol/L (ref 98–111)
Creatinine, Ser: 1.43 mg/dL — ABNORMAL HIGH (ref 0.61–1.24)
GFR calc Af Amer: 55 mL/min — ABNORMAL LOW (ref >60)
GFR calc non Af Amer: 48 mL/min — ABNORMAL LOW (ref >60)
Glucose, Bld: 133 mg/dL — ABNORMAL HIGH (ref 70–99)
Potassium: 5.3 mmol/L — ABNORMAL HIGH (ref 3.5–5.1)
Sodium: 135 mmol/L (ref 135–145)
Total Bilirubin: 0.7 mg/dL (ref 0.3–1.2)
Total Protein: 7.7 g/dL (ref 6.5–8.1)

## 2019-12-15 LAB — CBC WITH DIFFERENTIAL/PLATELET
Abs Immature Granulocytes: 0.05 10*3/uL (ref 0.00–0.07)
Basophils Absolute: 0.1 10*3/uL (ref 0.0–0.1)
Basophils Relative: 1 %
Eosinophils Absolute: 0.8 10*3/uL — ABNORMAL HIGH (ref 0.0–0.5)
Eosinophils Relative: 7 %
HCT: 41 % (ref 39.0–52.0)
Hemoglobin: 13.4 g/dL (ref 13.0–17.0)
Immature Granulocytes: 0 %
Lymphocytes Relative: 12 %
Lymphs Abs: 1.3 10*3/uL (ref 0.7–4.0)
MCH: 29.4 pg (ref 26.0–34.0)
MCHC: 32.7 g/dL (ref 30.0–36.0)
MCV: 89.9 fL (ref 80.0–100.0)
Monocytes Absolute: 0.9 10*3/uL (ref 0.1–1.0)
Monocytes Relative: 8 %
Neutro Abs: 8 10*3/uL — ABNORMAL HIGH (ref 1.7–7.7)
Neutrophils Relative %: 72 %
Platelets: 257 10*3/uL (ref 150–400)
RBC: 4.56 MIL/uL (ref 4.22–5.81)
RDW: 15.3 % (ref 11.5–15.5)
WBC: 11.1 10*3/uL — ABNORMAL HIGH (ref 4.0–10.5)
nRBC: 0 % (ref 0.0–0.2)

## 2019-12-15 NOTE — ED Triage Notes (Signed)
Patient arrived from [facility] where he resides. Patient reports he is here for abnormal kidney function labs. Reports he was started on antibiotics for UTI yesterday. Has foley catheter in place. C/o pain with hemorrhoids.

## 2019-12-15 NOTE — ED Notes (Signed)
Pt stated "they was suppose to take me to the [facility] hospital. I am 100% disabled and I am not going to pay for none of this. They are not suppose to take me no where but the [place]. They just dropped me off here. You make sure you let them know this."

## 2019-12-15 NOTE — ED Notes (Signed)
[facility]bow sent to the lab.  

## 2019-12-16 ENCOUNTER — Emergency Department
Admission: EM | Admit: 2019-12-16 | Discharge: 2019-12-16 | Disposition: A | Discharge status: Home / Self Care | Payer: No Typology Code available for payment source | Attending: Emergency Medicine | Admitting: Emergency Medicine

## 2019-12-16 ENCOUNTER — Emergency Department: Payer: No Typology Code available for payment source

## 2019-12-16 DIAGNOSIS — T83511A Infection and inflammatory reaction due to indwelling urethral catheter, initial encounter: Secondary | ICD-10-CM

## 2019-12-16 DIAGNOSIS — N179 Acute kidney failure, unspecified: Secondary | ICD-10-CM

## 2019-12-16 LAB — URINALYSIS, COMPLETE (UACMP) WITH MICROSCOPIC
Bilirubin Urine: NEGATIVE
Glucose, UA: NEGATIVE mg/dL
Ketones, ur: NEGATIVE mg/dL
Nitrite: POSITIVE — AB
Protein, ur: NEGATIVE mg/dL
Specific Gravity, Urine: 1.011 (ref 1.005–1.030)
Squamous Epithelial / HPF: NONE SEEN (ref 0–5)
pH: 5 (ref 5.0–8.0)

## 2019-12-16 LAB — LACTIC ACID, PLASMA: Lactic Acid, Venous: 1.7 mmol/L (ref 0.5–1.9)

## 2019-12-16 LAB — TROPONIN I (HIGH SENSITIVITY)
Troponin I (High Sensitivity): 7 ng/L (ref <18)
Troponin I (High Sensitivity): 7 ng/L (ref <18)

## 2019-12-16 MED ORDER — SODIUM CHLORIDE 0.9 % IV BOLUS
500.0000 mL | Freq: Once | INTRAVENOUS | Status: AC
  Administered 2019-12-16: 500 mL via INTRAVENOUS

## 2019-12-16 NOTE — ED Provider Notes (Signed)
[facility] Emergency Department Provider Note   ____________________________________________   First MD Initiated Contact with Patient 12/16/19 0059     (approximate)  I have reviewed the triage vital signs and the nursing notes.   HISTORY  Chief Complaint Abnormal Lab    HPI Raymond Hays is a 74 y.o. male sent from [facility] for abnormal kidney function labs.  Patient has an indwelling Foley catheter.  Had some labs done by his PCP at the [facility] yesterday and was told to come for abnormal kidney function test.  Patient reports he was started on antibiotics for UTI. Only complaint is hemorrhoidal pain which he experiences often.  Denies fever, chest pain, shortness of breath, abdominal pain, nausea, vomiting or diarrhea.  Patient has been vaccinated against COVID-19.  Just had his weekly Covid swab yesterday; results pending.  Has oxygen as needed for both COPD and lung scarring from COVID-19 infection last year.      Past Medical History:  Diagnosis Date  . Allergic rhinitis   . Anxiety   . Arthritis   . Chronic indwelling Foley catheter   . COPD (chronic obstructive pulmonary disease) (HCC)   . Cough   . Diabetes (HCC)   . Essential (primary) hypertension   . Hemorrhoid   . Hyperlipidemia   . PAF (paroxysmal atrial fibrillation) (HCC)   . Primary osteoarthritis, unspecified shoulder   . Retention of urine, unspecified   . Spinal stenosis   . UTI (urinary tract infection)     Patient Active Problem List   Diagnosis Date Noted  . Impaired functional mobility, balance, gait, and endurance 09/15/2019  . Catheter cystitis (HCC) 10/16/2018  . COVID-19 virus detected 07/03/2018  . Benign prostatic hyperplasia with lower urinary tract symptoms 08/06/2017  . Osteoarthritis of right knee 06/19/2017  . S/P orthopedic surgery, follow-up exam 10/25/2016  . Tendon rupture of wrist, sequela 01/02/2016  . Chronic pain of right knee  08/03/2015  . Calcific tendinitis of left shoulder 07/05/2015  . 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/05/2015  . Gait difficulty 06/26/2015  . Severe mitral regurgitation 06/05/2015  . Atrial fibrillation (HCC) 05/31/2015  . Chronic pain 05/31/2015  . COPD (chronic obstructive pulmonary disease) (HCC) 05/31/2015  . Essential hypertension 05/31/2015  . Chronic pain of left wrist 04/24/2015  . Arthritis of left wrist 04/24/2015  . Rupture of extensor tendon of left hand 04/24/2015    Past Surgical History:  Procedure Laterality Date  . APPENDECTOMY    . BACK SURGERY    . ELBOW SURGERY    . HERNIA REPAIR    . KNEE SURGERY    . neck    . PARTIAL HIP ARTHROPLASTY Bilateral   . TONSILLECTOMY      Prior to Admission medications   Medication Sig Start Date End Date Taking? Authorizing Provider  acetaminophen (TYLENOL) 325 MG tablet Take 650 mg by mouth every 4 (four) hours as needed.    [provider]  Albuterol Sulfate (PROAIR RESPICLICK) 108 (90 Base) MCG/ACT AEPB Inhale into the lungs.    [provider]  apixaban (ELIQUIS) 5 MG TABS tablet Take 5 mg by mouth 2 (two) times daily.  02/06/17   [provider]  ascorbic acid (VITAMIN C) 500 MG tablet Take by mouth.     [provider]  benzonatate (TESSALON) 100 MG capsule Take by mouth 2 (two) times daily as needed for cough.    [provider]  bisacodyl (DULCOLAX)  10 MG suppository Place 10 mg rectally as needed for moderate constipation.    [provider]  calcium carbonate (TUMS - DOSED IN MG ELEMENTAL CALCIUM) 500 MG chewable tablet Chew 2 tablets by mouth every 6 (six) hours as needed for indigestion or heartburn.    [provider]  carbidopa-levodopa (SINEMET IR) 25-100 MG tablet Take 1 tablet by mouth 3 (three) times daily. Start Carbidopa Levodopa as follows: Take 1/2 tablet three times daily, at least 30 minutes before meals (approximately 7am/11am/4pm), for one  week Then take 1/2 tablet in the morning, 1/2 tablet in the afternoon, 1 tablet in the evening, at least 30 minutes before meals, for one week Then take 1/2 tablet in the morning, 1 tablet in the afternoon, 1 tablet in the evening, at least 30 minutes before meals, for one week Then take 1 tablet three times daily at 7am/11am/4pm, at least 30 minutes before meals 12/07/19   Tat, Octaviano Batty, DO  carboxymethylcellulose (REFRESH PLUS) 0.5 % SOLN 1 drop as needed.    [provider]  cetirizine (ZYRTEC) 10 MG tablet Take 10 mg by mouth daily.     [provider]  diphenhydrAMINE-zinc acetate (BENADRYL ITCH STOPPING) cream Apply topically every 4 (four) hours.    [provider]  Docusate Sodium (STOOL SOFTENER LAXATIVE PO) Take 1 tablet by mouth in the morning and at bedtime.    [provider]  Emollient (CETAPHIL) cream Apply 1 application topically as needed.    [provider]  Emollient (EUCERIN DAILY PROTECTION/SPF30) LOTN Apply topically as needed.    [provider]  escitalopram (LEXAPRO) 5 MG tablet Take 5 mg by mouth daily.    [provider]  finasteride (PROSCAR) 5 MG tablet Take 5 mg by mouth daily.    [provider]  fluticasone (FLONASE) 50 MCG/ACT nasal spray Place 1 spray into both nostrils daily.    [provider]  Fluticasone-Umeclidin-Vilant (TRELEGY ELLIPTA) 100-62.5-25 MCG/INH AEPB Inhale 1 puff into the lungs daily.    [provider]  gabapentin (NEURONTIN) 800 MG tablet Take 800 mg by mouth 2 (two) times daily.     [provider]  guaiFENesin (MUCINEX) 600 MG 12 hr tablet Take 600 mg by mouth every 12 (twelve) hours.    [provider]  hydrocortisone (ANUSOL-HC) 2.5 % rectal cream Place 1 application rectally 2 (two) times daily. 09/15/19   Toney Reil, MD  hydrocortisone cream 0.5 % Apply topically.    [provider]  ibuprofen (ADVIL) 200 MG tablet  Take 200 mg by mouth 2 (two) times daily as needed (jaw pain).    [provider]  ipratropium (ATROVENT) 0.06 % nasal spray Place into the nose.    [provider]  lactase (LACTAID) 3000 units tablet Take 3,000 Units by mouth 3 (three) times daily with meals.    [provider]  Lidocaine 3 % CREA Apply topically daily as needed.    [provider]  lisinopril (ZESTRIL) 2.5 MG tablet Take 2.5 mg by mouth daily.    [provider]  loperamide (IMODIUM) 2 MG capsule Take 4 mg by mouth as needed for diarrhea or loose stools.    [provider]  metFORMIN (GLUCOPHAGE-XR) 500 MG 24 hr tablet Take 500 mg by mouth daily.     [provider]  methocarbamol (ROBAXIN) 500 MG tablet Take 1,000 mg by mouth in the morning and at bedtime.     [provider]  metoprolol succinate (TOPROL-XL) 50 MG 24 hr tablet Take 50 mg by mouth daily.     [provider]  montelukast (SINGULAIR) 10 MG tablet Take 10 mg by mouth at bedtime.    [provider]  nystatin (MYCOSTATIN/NYSTOP) powder  10/21/18   [provider]  psyllium (METAMUCIL) 58.6 % powder Take 1 packet by mouth daily.    [provider]  senna-docusate (SENOKOT-S) 8.6-50 MG tablet Take by mouth. 10/21/18   [provider]  Skin Protectants, Misc. (MINERIN CREME EX) Apply topically. Apply to feet twice bid    [provider]  tamsulosin (FLOMAX) 0.4 MG CAPS capsule Take 0.4 mg by mouth.    [provider]  torsemide (DEMADEX) 10 MG tablet Take 10 mg by mouth daily.    [provider]  traMADol (ULTRAM) 50 MG tablet Take 100 mg by mouth in the morning.     [provider]  traZODone (DESYREL) 100 MG tablet Take 100 mg by mouth at bedtime.    [provider]  triamcinolone cream (KENALOG) 0.1 % Apply 1 application topically 2 (two) times daily.    [provider]    Allergies Patient has  no known allergies.  Family History  Problem Relation Age of Onset  . Healthy Son   . Healthy Son     Social History Social History   Tobacco Use  . Smoking status: Former Smoker    Packs/day: 0.50    Years: 10.00    Pack years: 5.00    Types: Cigarettes    Quit date: 2006    Years since quitting: 15.7  . Smokeless tobacco: Never Used  Vaping Use  . Vaping Use: Never used  Substance Use Topics  . Alcohol use: Not Currently  . Drug use: Not Currently    Types: "Crack" cocaine    Comment: quit "crack" 15 years ago--11/04/2019    Review of Systems  Constitutional: No fever/chills Eyes: No visual changes. ENT: No sore throat. Cardiovascular: Denies chest pain. Respiratory: Denies shortness of breath. Gastrointestinal: No abdominal pain.  No nausea, no vomiting.  No diarrhea.  No constipation. Genitourinary: Negative for dysuria. Musculoskeletal: Negative for back pain. Skin: Negative for rash. Neurological: Negative for headaches, focal weakness or numbness.   ____________________________________________   PHYSICAL EXAM:  VITAL SIGNS: ED Triage Vitals  Enc Vitals Group     BP 12/15/19 1524 (!) 113/98     Pulse Rate 12/15/19 1524 80     Resp 12/15/19 1524 (!) 24     Temp 12/15/19 1524 99.4 F (37.4 C)     Temp Source 12/15/19 1524 Oral     SpO2 12/15/19 1524 97 %     Weight 12/15/19 1524 235 lb (106.6 kg)     Height 12/15/19 1524 5\' 7"  (1.702 m)     Head Circumference --      Peak Flow --      Pain Score 12/15/19 1551 4     Pain Loc --      Pain Edu? --      Excl. in GC? --     Constitutional: Alert and oriented.  Elderly appearing and in no acute distress. Eyes: Conjunctivae are normal. PERRL. EOMI. Head: Atraumatic. Nose: No congestion/rhinnorhea. Mouth/Throat: Mucous membranes are mildly dry.   Neck: No stridor.   Cardiovascular: Normal rate, irregular rhythm. Grossly normal heart sounds.  Good peripheral circulation. Respiratory: Normal  respiratory effort.  No retractions. Lungs with scattered  rhonchi.  Loose and rattly cough noted. Gastrointestinal: Soft and nontender. No distention. No abdominal bruits. No CVA tenderness. Genitourinary: Indwelling Foley catheter in place. Musculoskeletal: No lower extremity tenderness nor edema.  No joint effusions. Neurologic:  Normal speech and language. No gross focal neurologic deficits are appreciated.  Skin:  Skin is warm, dry and intact. No rash noted. Psychiatric: Mood and affect are normal. Speech and behavior are normal.  ____________________________________________   LABS (all labs ordered are listed, but only abnormal results are displayed)  Labs Reviewed  CBC WITH DIFFERENTIAL/PLATELET - Abnormal; Notable for the following components:      Result Value   WBC 11.1 (*)    Neutro Abs 8.0 (*)    Eosinophils Absolute 0.8 (*)    All other components within normal limits  COMPREHENSIVE METABOLIC PANEL - Abnormal; Notable for the following components:   Potassium 5.3 (*)    Glucose, Bld 133 (*)    BUN 38 (*)    Creatinine, Ser 1.43 (*)    GFR calc non Af Amer 48 (*)    GFR calc Af Amer 55 (*)    All other components within normal limits  URINALYSIS, COMPLETE (UACMP) WITH MICROSCOPIC - Abnormal; Notable for the following components:   Color, Urine YELLOW (*)    APPearance HAZY (*)    Hgb urine dipstick MODERATE (*)    Nitrite POSITIVE (*)    Leukocytes,Ua MODERATE (*)    Bacteria, UA MANY (*)    All other components within normal limits  CULTURE, BLOOD (ROUTINE X 2)  CULTURE, BLOOD (ROUTINE X 2)  URINE CULTURE  LACTIC ACID, PLASMA  TROPONIN I (HIGH SENSITIVITY)  TROPONIN I (HIGH SENSITIVITY)   ____________________________________________  EKG  ED ECG REPORT I, Angalena Cousineau J, the attending physician, personally viewed and interpreted this ECG.   Date: 12/16/2019  EKG Time: 0244  Rate: 91  Rhythm: atrial fibrillation, rate 91  Axis: Normal   Intervals:nonspecific intraventricular conduction delay  ST&T Change: Nonspecific  ____________________________________________  RADIOLOGY  ED MD interpretation: No acute cardiopulmonary process  Official radiology report(s): DG Chest Port 1 View  Result Date: 12/16/2019 CLINICAL DATA:  Cough EXAM: PORTABLE CHEST 1 VIEW COMPARISON:  None. FINDINGS: The heart size and mediastinal contours are within normal limits. Both lungs are clear. The visualized skeletal structures are unremarkable. Aortic atherosclerosis. Partially visualized hardware at the cervical spine. IMPRESSION: No active disease. Electronically Signed   By: Jasmine PangKim  Fujinaga M.D.   On: 12/16/2019 02:31    ____________________________________________   PROCEDURES  Procedure(s) performed (including Critical Care):  .1-3 Lead EKG Interpretation Performed by: Irean HongSung, Tashae Inda J, MD Authorized by: Irean HongSung, Crystle Carelli J, MD     Interpretation: normal     ECG rate:  95   ECG rate assessment: normal     Rhythm: sinus rhythm     Ectopy: none     Conduction: normal   Comments:     Patient placed on cardiac monitor to evaluate for arrhythmias     ____________________________________________   INITIAL IMPRESSION / ASSESSMENT AND PLAN / ED COURSE  As part of my medical decision making, I reviewed the following data within the electronic MEDICAL RECORD NUMBER Nursing notes reviewed and incorporated, Labs reviewed, EKG interpreted, Old chart reviewed, Radiograph reviewed and Notes from prior ED visits     74 year old male with indwelling Foley catheter currently on antibiotics for UTI sent to the ED for abnormal kidney function.  Differential diagnosis includes but is not limited to AKI,  dehydration, infectious, metabolic etiologies, etc.  Laboratory results demonstrate increased creatinine from 1 month ago.  Potassium is 5.3 without EKG changes.  Loose rattly cough noted.  Will obtain chest x-ray.  Check blood cultures, lactic acid, troponin.   Will reassess.   Clinical Course as of Dec 16 639  Thu Dec 16, 2019  [id number] Patient resting in no acute distress.  Updated him of negative lactic acid, troponin and chest x-ray results.  Urine culture is pending.  Patient confirms he is being treated with an antibiotic currently for a UTI.  Will follow up closely with his PCP.  Strict return precautions given.  Patient verbalizes understanding and agrees with plan of care.   [JS]    Clinical Course User Index [JS] Irean Hong, MD     ____________________________________________   FINAL CLINICAL IMPRESSION(S) / ED DIAGNOSES  Final diagnoses:  AKI (acute kidney injury) (HCC)  Urinary tract infection associated with indwelling urethral catheter, initial encounter [facility])     ED Discharge Orders    None      *Please note:  Raymond Hays was evaluated in Emergency Department on 12/16/2019 for the symptoms described in the history of present illness. He was evaluated in the context of the global COVID-19 pandemic, which necessitated consideration that the patient might be at risk for infection with the SARS-CoV-2 virus that causes COVID-19. Institutional protocols and algorithms that pertain to the evaluation of patients at risk for COVID-19 are in a state of rapid change based on information released by regulatory bodies including the CDC and federal and state organizations. These policies and algorithms were followed during the patient's care in the ED.  Some ED evaluations and interventions may be delayed as a result of limited staffing during and the pandemic.*   Note:  This document was prepared using Dragon voice recognition software and may include unintentional dictation errors.   Irean Hong, MD 12/16/19 [phone]

## 2019-12-16 NOTE — ED Notes (Signed)
[facility]  CALLED  FOR  TRANSPORT 

## 2019-12-16 NOTE — ED Notes (Signed)
Report called over to [place] at [facility] about the pt and his pending dc. No questions were posed to this RN and no concerns were mentioned/

## 2019-12-16 NOTE — Discharge Instructions (Addendum)
1.  Continue to take all medications as directed by your doctor. 2.  Follow-up with your doctor closely for recheck of your kidney function. 3.  Return to the ER for worsening symptoms, persistent vomiting, difficulty breathing or other concerns.

## 2019-12-16 NOTE — ED Notes (Signed)
EMS  CNL PER  YESSICA  RN

## 2019-12-18 LAB — URINE CULTURE: Culture: >=100000 — AB

## 2019-12-19 NOTE — Progress Notes (Signed)
ED Antimicrobial Stewardship Positive Culture Follow Up   [name] is an 74 y.o. male who presented to [facility] on 12/16/2019 with a chief complaint of abnormal lab from PCP. Patient has an indwelling catheter and was started on Macrobid for UTI by PCP.   Spoke to nurse at [facility] who provided information on patient's Macrobid treatment. Notified her that the patient's antibiotics will be changed and faxed a copy of the culture report and antibiotic order.  Chief Complaint  Patient presents with  . Abnormal Lab    Recent Results (from the past 720 hour(s))  Culture, blood (routine x 2)     Status: None (Preliminary result)   Collection Time: 12/16/19  3:05 AM   Specimen: BLOOD  Result Value Ref Range Status   Specimen Description BLOOD LEFT AC  Final   Special Requests   Final    BOTTLES DRAWN AEROBIC AND ANAEROBIC Blood Culture results may not be optimal due to an excessive volume of blood received in culture bottles   Culture   Final    NO GROWTH 3 DAYS Performed at [facility], [address], [place], [place] [address]    Report Status PENDING  Incomplete  Culture, blood (routine x 2)     Status: None (Preliminary result)   Collection Time: 12/16/19  3:05 AM   Specimen: BLOOD  Result Value Ref Range Status   Specimen Description BLOOD RIGHT AC  Final   Special Requests   Final    BOTTLES DRAWN AEROBIC AND ANAEROBIC Blood Culture results may not be optimal due to an excessive volume of blood received in culture bottles   Culture   Final    NO GROWTH 3 DAYS Performed at [facility], [address] [place], [place] [address]    Report Status PENDING  Incomplete  Urine culture     Status: Abnormal   Collection Time: 12/16/19  3:05 AM   Specimen: Urine, Random  Result Value Ref Range Status   Specimen Description   Final    URINE, RANDOM Performed at [facility], [address], [place], [place] [address]    Special Requests    Final    NONE Performed at [facility], [address] [place], [place] [address]    Culture >=100,000 COLONIES/mL SERRATIA MARCESCENS (A)  Final   Report Status 12/18/2019 FINAL  Final   Organism ID, Bacteria SERRATIA MARCESCENS (A)  Final      Susceptibility   Serratia marcescens - MIC*    CEFAZOLIN >=64 RESISTANT Resistant     CEFTRIAXONE 0.5 SENSITIVE Sensitive     CIPROFLOXACIN <=0.25 SENSITIVE Sensitive     GENTAMICIN <=1 SENSITIVE Sensitive     NITROFURANTOIN 128 RESISTANT Resistant     TRIMETH/SULFA <=20 SENSITIVE Sensitive     * >=100,000 COLONIES/mL SERRATIA MARCESCENS    [x]  Treated with Macrobid 100mg  BID starting 9/15, organism resistant to prescribed antimicrobial [x]  Patient discharged originally without antimicrobial agent and treatment is now indicated  New antibiotic prescription: Ciprofloxacin 500mg  BID x7 days  ED Provider: , PharmD Pharmacy Resident  12/19/2019 9:38 AM

## 2019-12-21 LAB — CULTURE, BLOOD (ROUTINE X 2)
Culture: NO GROWTH
Culture: NO GROWTH

## 2019-12-27 ENCOUNTER — Other Ambulatory Visit: Payer: Self-pay

## 2019-12-27 ENCOUNTER — Encounter: Payer: Self-pay | Admitting: Primary Care

## 2019-12-27 ENCOUNTER — Telehealth: Payer: Self-pay

## 2019-12-27 ENCOUNTER — Ambulatory Visit (INDEPENDENT_AMBULATORY_CARE_PROVIDER_SITE_OTHER): Payer: No Typology Code available for payment source | Admitting: Primary Care

## 2019-12-27 ENCOUNTER — Other Ambulatory Visit
Admission: RE | Admit: 2019-12-27 | Discharge: 2019-12-27 | Disposition: A | Discharge status: Home / Self Care | Payer: No Typology Code available for payment source | Source: Ambulatory Visit | Attending: Primary Care | Admitting: Primary Care

## 2019-12-27 VITALS — BP 118/72 | HR 95 | Temp 97.3°F | Ht 67.0 in | Wt 235.0 lb

## 2019-12-27 DIAGNOSIS — J449 Chronic obstructive pulmonary disease, unspecified: Secondary | ICD-10-CM

## 2019-12-27 DIAGNOSIS — R0602 Shortness of breath: Secondary | ICD-10-CM | POA: Insufficient documentation

## 2019-12-27 DIAGNOSIS — J42 Unspecified chronic bronchitis: Secondary | ICD-10-CM | POA: Diagnosis not present

## 2019-12-27 DIAGNOSIS — J9611 Chronic respiratory failure with hypoxia: Secondary | ICD-10-CM | POA: Diagnosis not present

## 2019-12-27 LAB — FIBRIN DERIVATIVES D-DIMER (ARMC ONLY): Fibrin derivatives D-dimer (ARMC): 702.35 ng/mL (FEU) — ABNORMAL HIGH (ref 0.00–499.00)

## 2019-12-27 LAB — CBC WITH DIFFERENTIAL/PLATELET
Abs Immature Granulocytes: 0.06 10*3/uL (ref 0.00–0.07)
Basophils Absolute: 0.1 10*3/uL (ref 0.0–0.1)
Basophils Relative: 1 %
Eosinophils Absolute: 0.4 10*3/uL (ref 0.0–0.5)
Eosinophils Relative: 5 %
HCT: 40 % (ref 39.0–52.0)
Hemoglobin: 13 g/dL (ref 13.0–17.0)
Immature Granulocytes: 1 %
Lymphocytes Relative: 17 %
Lymphs Abs: 1.5 10*3/uL (ref 0.7–4.0)
MCH: 29.3 pg (ref 26.0–34.0)
MCHC: 32.5 g/dL (ref 30.0–36.0)
MCV: 90.1 fL (ref 80.0–100.0)
Monocytes Absolute: 0.6 10*3/uL (ref 0.1–1.0)
Monocytes Relative: 7 %
Neutro Abs: 6.2 10*3/uL (ref 1.7–7.7)
Neutrophils Relative %: 69 %
Platelets: 240 10*3/uL (ref 150–400)
RBC: 4.44 MIL/uL (ref 4.22–5.81)
RDW: 15.9 % — ABNORMAL HIGH (ref 11.5–15.5)
WBC: 8.9 10*3/uL (ref 4.0–10.5)
nRBC: 0 % (ref 0.0–0.2)

## 2019-12-27 LAB — BRAIN NATRIURETIC PEPTIDE: B Natriuretic Peptide: 102.6 pg/mL — ABNORMAL HIGH (ref 0.0–100.0)

## 2019-12-27 NOTE — Telephone Encounter (Signed)
Medical release request has been faxed to [facility] for cardiology notes.

## 2019-12-27 NOTE — Patient Instructions (Addendum)
CXR in September showed clear lungs Recommend following up with [facility] cardiology in [place]- may need consideration for cardiac catheterization   Recommendations: - Continue Breztri two puffs twice a day (please add spacer to use with inhaler) - Continue mucinex twice daily, Singulair 10mg  at bedtime and nasal sprays as prescribed  - Continue Incentive spirometer 10x/hour  - Add flutter valve THREE times a day  - Continue oxygen on exertion to maintain O2 >88%   Orders: - Renew physical therapy with facility or refer to pulmonary rehab re: chronic respiratory failure   Follow-up: - 2-3 months with Dr. 

## 2019-12-27 NOTE — Progress Notes (Signed)
_0  ID: Raymond Hays, male    DOB: July 22, 1945, 74 y.o.   MRN: [id number]  Chief Complaint  Patient presents with  . Follow-up    sob with mild exertion, prod cough with clear to tan mucus and wheezing.     Referring provider: [facility]  HPI: 74 year old male, former smoker quit in 2006 (5-pack-year history).  Past medical history significant for COPD, A. fib, hypertension, severe mitral regurgitation, COVID-19 virus, chronic pain.  Patient of Dr. Patsey Berthold, for initial consult on 11/04/2019.  Shortness of breath felt to be related to mitral regurgitation.  Patient was advised to keep appointment with cardiologist.  Given a sample of Breztri 2 puffs twice daily for underlying congestion/smoking hx.  Previous [facility] pulmonary encounter: 11/04/19- Dr. Patsey Berthold, consult Patient is a 74 year old remote former smoker who presents for evaluation of dyspnea.  He is referred by Dr. Marlowe Shores of the [facility].  The patient had COVID-19 in April 2020 and since then has noted increasing issues with dyspnea.  He presents as a second opinion with regards to pulmonary issues for dyspnea.  He has been previously evaluated at the [facility] in [place] by pulmonary.  He has noted cough that at times is "violent".  Sputum is thick sometimes hard to produce.  No significant color to it.  No hemoptysis.  Intermittent nonspecific chest pain, none recently.  The patient had pulmonary function testing performed in April 2021 showing combined restrictive and obstructive physiology diffusion capacity was moderately reduced.  It appears restrictive physiology due to obesity as his ERV is 14%.  His inspiratory and expiratory mouth pressures during the test were consistent with muscle weakness.  He is currently not using inhalers, apparently has tried Trelegy without help.  He notes that really "everything" makes his symptoms worse and "nothing" helps.  He appears significantly deconditioned he does have spinal  stenosis which adds to his debility.   Other studies performed at the [facility] showed an echocardiogram that shows moderate to severe mitral regurgitation by the direction of the mitral regurgitant jet it was consistent with posterior leaflet pathology.  He informs me that he is to see cardiology next.  Patient has been using oxygen with exertion however per prior records it does not appear that he has met criteria.  He cannot perform ambulatory oximetry today as he did not bring his walker and he is in a wheelchair.  12/27/2019-interim history Patient presents today for 6-week follow-up. Patient is currently on Breztri 2 puffs twice daily and prefers this to Trelegy. Patient reports less cough, still has significant chest congested with clear to tan sputum.  He had a negative chest x-ray in the emergency room recently.  He uses oxygen on exertion only. His oxygen level today was 100% on room air, patient is unable to walk more than 50 feet due to fatigue. He was receiving physical therapy at living facility but recently ended and needs renewed.  He is in a wheelchair today.  He is taking Sinemet for hand tremor/weakness.  He does not meet the full criteria for Parkinson's but is following with neurology.Patient was referred to cardiology with [facility], requesting notes.    No Known Allergies   There is no immunization history on file for this patient.  Past Medical History:  Diagnosis Date  . Allergic rhinitis   . Anxiety   . Arthritis   . Chronic indwelling Foley catheter   . COPD (chronic obstructive pulmonary disease) ([place])   .  Cough   . Diabetes ([place])   . Essential (primary) hypertension   . Hemorrhoid   . Hyperlipidemia   . PAF (paroxysmal atrial fibrillation) ([place])   . Primary osteoarthritis, unspecified shoulder   . Retention of urine, unspecified   . Spinal stenosis   . UTI (urinary tract infection)     Tobacco History: Social History   Tobacco Use  Smoking Status  Former Smoker  . Packs/day: 0.50  . Years: 10.00  . Pack years: 5.00  . Types: Cigarettes  . Quit date: 2006  . Years since quitting: 15.7  Smokeless Tobacco Never Used   Counseling given: Not Answered   Outpatient Medications Prior to Visit  Medication Sig Dispense Refill  . acetaminophen (TYLENOL) 325 MG tablet Take 650 mg by mouth every 4 (four) hours as needed.    . Albuterol Sulfate (PROAIR RESPICLICK) 016 (90 Base) MCG/ACT AEPB Inhale into the lungs.    Marland Kitchen apixaban (ELIQUIS) 5 MG TABS tablet Take 5 mg by mouth 2 (two) times daily.     Marland Kitchen ascorbic acid (VITAMIN C) 500 MG tablet Take by mouth.     . benzonatate (TESSALON) 100 MG capsule Take by mouth 2 (two) times daily as needed for cough.    . bisacodyl (DULCOLAX) 10 MG suppository Place 10 mg rectally as needed for moderate constipation.    . Budeson-Glycopyrrol-Formoterol (BREZTRI AEROSPHERE) 160-9-4.8 MCG/ACT AERO Inhale 2 puffs into the lungs in the morning and at bedtime.    . calcium carbonate (TUMS - DOSED IN MG ELEMENTAL CALCIUM) 500 MG chewable tablet Chew 2 tablets by mouth every 6 (six) hours as needed for indigestion or heartburn.    . carbidopa-levodopa (SINEMET IR) 25-100 MG tablet Take 1 tablet by mouth 3 (three) times daily. Start Carbidopa Levodopa as follows: Take 1/2 tablet three times daily, at least 30 minutes before meals (approximately 7am/11am/4pm), for one week Then take 1/2 tablet in the morning, 1/2 tablet in the afternoon, 1 tablet in the evening, at least 30 minutes before meals, for one week Then take 1/2 tablet in the morning, 1 tablet in the afternoon, 1 tablet in the evening, at least 30 minutes before meals, for one week Then take 1 tablet three times daily at 7am/11am/4pm, at least 30 minutes before meals 270 tablet 1  . carboxymethylcellulose (REFRESH PLUS) 0.5 % SOLN 1 drop as needed.    . cetirizine (ZYRTEC) 10 MG tablet Take 10 mg by mouth daily.     . diphenhydrAMINE-zinc acetate (BENADRYL ITCH  STOPPING) cream Apply topically every 4 (four) hours.    Mariane Baumgarten Sodium (STOOL SOFTENER LAXATIVE PO) Take 1 tablet by mouth in the morning and at bedtime.    . Emollient (CETAPHIL) cream Apply 1 application topically as needed.    . Emollient (EUCERIN DAILY PROTECTION/SPF30) LOTN Apply topically as needed.    Marland Kitchen escitalopram (LEXAPRO) 5 MG tablet Take 5 mg by mouth daily.    . finasteride (PROSCAR) 5 MG tablet Take 5 mg by mouth daily.    . fluticasone (FLONASE) 50 MCG/ACT nasal spray Place 1 spray into both nostrils daily.    Marland Kitchen gabapentin (NEURONTIN) 800 MG tablet Take 800 mg by mouth 2 (two) times daily.     Marland Kitchen guaiFENesin (MUCINEX) 600 MG 12 hr tablet Take 600 mg by mouth every 12 (twelve) hours.    . hydrocortisone (ANUSOL-HC) 2.5 % rectal cream Place 1 application rectally 2 (two) times daily. 30 g 1  . hydrocortisone cream 0.5 %  Apply topically.    Marland Kitchen ibuprofen (ADVIL) 200 MG tablet Take 200 mg by mouth 2 (two) times daily as needed (jaw pain).    Marland Kitchen ipratropium (ATROVENT) 0.06 % nasal spray Place into the nose.    . lactase (LACTAID) 3000 units tablet Take 3,000 Units by mouth 3 (three) times daily with meals.    . Lidocaine 3 % CREA Apply topically daily as needed.    Marland Kitchen lisinopril (ZESTRIL) 2.5 MG tablet Take 2.5 mg by mouth daily.    Marland Kitchen loperamide (IMODIUM) 2 MG capsule Take 4 mg by mouth as needed for diarrhea or loose stools.    . metFORMIN (GLUCOPHAGE-XR) 500 MG 24 hr tablet Take 500 mg by mouth daily.     . methocarbamol (ROBAXIN) 500 MG tablet Take 1,000 mg by mouth in the morning and at bedtime.     . metoprolol succinate (TOPROL-XL) 50 MG 24 hr tablet Take 50 mg by mouth daily.     . montelukast (SINGULAIR) 10 MG tablet Take 10 mg by mouth at bedtime.    Marland Kitchen nystatin (MYCOSTATIN/NYSTOP) powder     . psyllium (METAMUCIL) 58.6 % powder Take 1 packet by mouth daily.    Marland Kitchen senna-docusate (SENOKOT-S) 8.6-50 MG tablet Take by mouth.    . Skin Protectants, Misc. (MINERIN CREME EX) Apply  topically. Apply to feet twice bid    . tamsulosin (FLOMAX) 0.4 MG CAPS capsule Take 0.4 mg by mouth.    . torsemide (DEMADEX) 10 MG tablet Take 10 mg by mouth daily.    . traMADol (ULTRAM) 50 MG tablet Take 100 mg by mouth in the morning.     . traZODone (DESYREL) 100 MG tablet Take 100 mg by mouth at bedtime.    . triamcinolone cream (KENALOG) 0.1 % Apply 1 application topically 2 (two) times daily.    . Fluticasone-Umeclidin-Vilant (TRELEGY ELLIPTA) 100-62.5-25 MCG/INH AEPB Inhale 1 puff into the lungs daily.     No facility-administered medications prior to visit.    Review of Systems  Review of Systems  Constitutional: Positive for fatigue.       Exertional fatigue  Respiratory: Positive for cough and shortness of breath.   Cardiovascular: Positive for leg swelling.   Physical Exam  BP 118/72 (BP Location: Left Arm, Cuff Size: Normal)   Pulse 95   Temp (!) 97.3 F (36.3 C) (Temporal)   Ht _0  (1.702 m)   Wt 235 lb (106.6 kg) Comment: weight is per patient  SpO2 100%   BMI 36.81 kg/m  Physical Exam Constitutional:      General: He is not in acute distress.    Appearance: He is obese.     Comments: Chronically ill appearing  HENT:     Mouth/Throat:     Comments: Deferred due to masking Cardiovascular:     Rate and Rhythm: Tachycardia present.     Comments: +3 RLE/ +1-2LLE Pulmonary:     Comments: Diminished right base Musculoskeletal:     Comments: In WC.  Left hand weakness/tremor.  Neurological:     General: No focal deficit present.     Mental Status: He is alert and oriented to person, place, and time. Mental status is at baseline.  Psychiatric:        Mood and Affect: Mood normal.        Behavior: Behavior normal.        Thought Content: Thought content normal.        Judgment: Judgment normal.  Lab Results:  CBC    Component Value Date/Time   WBC 8.9 12/27/2019 1605   RBC 4.44 12/27/2019 1605   HGB 13.0 12/27/2019 1605   HCT 40.0  12/27/2019 1605   PLT 240 12/27/2019 1605   MCV 90.1 12/27/2019 1605   MCH 29.3 12/27/2019 1605   MCHC 32.5 12/27/2019 1605   RDW 15.9 (H) 12/27/2019 1605   LYMPHSABS 1.5 12/27/2019 1605   MONOABS 0.6 12/27/2019 1605   EOSABS 0.4 12/27/2019 1605   BASOSABS 0.1 12/27/2019 1605    BMET    Component Value Date/Time   NA 135 12/15/2019 1525   K 5.3 (H) 12/15/2019 1525   CL 101 12/15/2019 1525   CO2 25 12/15/2019 1525   GLUCOSE 133 (H) 12/15/2019 1525   BUN 38 (H) 12/15/2019 1525   CREATININE 1.43 (H) 12/15/2019 1525   CALCIUM 9.2 12/15/2019 1525   GFRNONAA 48 (L) 12/15/2019 1525   GFRAA 55 (L) 12/15/2019 1525    BNP    Component Value Date/Time   BNP 102.6 (H) 12/27/2019 1605    ProBNP No results found for: PROBNP  Imaging: CT ANGIO CHEST PE W OR WO CONTRAST  Result Date: 12/28/2019 CLINICAL DATA:  Short of breath with exertion. Cough and wheezing. Rule out pulmonary embolism. EXAM: CT ANGIOGRAPHY CHEST WITH CONTRAST TECHNIQUE: Multidetector CT imaging of the chest was performed using the standard protocol during bolus administration of intravenous contrast. Multiplanar CT image reconstructions and MIPs were obtained to evaluate the vascular anatomy. CONTRAST:  67m OMNIPAQUE IOHEXOL 350 MG/ML SOLN COMPARISON:  Chest x-ray 12/16/2019 FINDINGS: Cardiovascular: Negative for pulmonary embolism. Mild atherosclerotic calcification aortic arch. Aortic arch not significantly opacified. Moderate to extensive coronary calcification with three-vessel coronary disease. Heart size mildly enlarged. No pericardial effusion. Mediastinum/Nodes: Negative for mass or adenopathy. Lungs/Pleura: Mild dependent atelectasis in the right lung base. Left lung is clear. No infiltrate effusion or mass. Upper Abdomen: 2 cm right renal hypodensity likely a cyst. Possible gallstone. Musculoskeletal: Multilevel degenerative change throughout the thoracic spine with dextroscoliosis. Abnormal right fifth rib which  appears enlarged with thickening of the cortex diffusely. No cortical irregularity or bone destruction. Similar but less pronounced changes in the left eighth rib and left eleventh rib. Review of the MIP images confirms the above findings. IMPRESSION: 1. Negative for pulmonary embolism 2. Three-vessel coronary artery calcification with mild cardiac enlargement. 3. Lungs are clear with mild right lower lobe atelectasis 4. Possible gallstone 5. Bilateral rib changes with sclerotic cortical thickening. Differential diagnosis includes Paget's disease and sclerotic metastatic disease. Consider bone scan to identify other lesions. Electronically Signed   By: CFranchot GalloM.D.   On: 12/28/2019 15:27   UKoreaVenous Img Lower Bilateral (DVT)  Result Date: 12/29/2019 CLINICAL DATA:  Shortness of breath and elevated D-dimer. Right lower extremity pain and edema. EXAM: BILATERAL LOWER EXTREMITY VENOUS DOPPLER ULTRASOUND TECHNIQUE: Gray-scale sonography with graded compression, as well as color Doppler and duplex ultrasound were performed to evaluate the lower extremity deep venous systems from the level of the common femoral vein and including the common femoral, femoral, profunda femoral, popliteal and calf veins including the posterior tibial, peroneal and gastrocnemius veins when visible. The superficial great saphenous vein was also interrogated. Spectral Doppler was utilized to evaluate flow at rest and with distal augmentation maneuvers in the common femoral, femoral and popliteal veins. COMPARISON:  None. FINDINGS: RIGHT LOWER EXTREMITY Common Femoral Vein: No evidence of thrombus. Normal compressibility, respiratory phasicity and response to augmentation. Saphenofemoral  Junction: No evidence of thrombus. Normal compressibility and flow on color Doppler imaging. Profunda Femoral Vein: No evidence of thrombus. Normal compressibility and flow on color Doppler imaging. Femoral Vein: No evidence of thrombus. Normal  compressibility, respiratory phasicity and response to augmentation. Popliteal Vein: No evidence of thrombus. Normal compressibility, respiratory phasicity and response to augmentation. Calf Veins: No evidence of thrombus. Normal compressibility and flow on color Doppler imaging. Superficial Great Saphenous Vein: No evidence of thrombus. Normal compressibility. Venous Reflux:  None. Other Findings: No evidence of superficial thrombophlebitis or abnormal fluid collection. LEFT LOWER EXTREMITY Common Femoral Vein: No evidence of thrombus. Normal compressibility, respiratory phasicity and response to augmentation. Saphenofemoral Junction: No evidence of thrombus. Normal compressibility and flow on color Doppler imaging. Profunda Femoral Vein: No evidence of thrombus. Normal compressibility and flow on color Doppler imaging. Femoral Vein: No evidence of thrombus. Normal compressibility, respiratory phasicity and response to augmentation. Popliteal Vein: No evidence of thrombus. Normal compressibility, respiratory phasicity and response to augmentation. Calf Veins: No evidence of thrombus. Normal compressibility and flow on color Doppler imaging. Superficial Great Saphenous Vein: No evidence of thrombus. Normal compressibility. Venous Reflux:  None. Other Findings: No evidence of superficial thrombophlebitis or abnormal fluid collection. IMPRESSION: No evidence of deep venous thrombosis in either lower extremity. Electronically Signed   By: Aletta Edouard M.D.   On: 12/29/2019 16:02   DG Chest Port 1 View  Result Date: 12/16/2019 CLINICAL DATA:  Cough EXAM: PORTABLE CHEST 1 VIEW COMPARISON:  None. FINDINGS: The heart size and mediastinal contours are within normal limits. Both lungs are clear. The visualized skeletal structures are unremarkable. Aortic atherosclerosis. Partially visualized hardware at the cervical spine. IMPRESSION: No active disease. Electronically Signed   By: Donavan Foil M.D.   On: 12/16/2019  02:31     Assessment & Plan:   COPD (chronic obstructive pulmonary disease) ([place]) - PFTs in April 2021 showed mixture of restrictive and obstructive physiology. 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 Dyspnea felt to be d.t underlying cardiac conditions. Cough has improved some, still has congestion with clear-tan sputum. CXR in September showed clear lungs. Recommend following-up with [facility] in [place]. He may need consideration for cardiac catheterization.   Plan: - Continue Breztri two puffs twice a day (please add spacer to use with inhaler) - Continue mucinex twice daily, Singulair 84m at bedtime and nasal sprays as prescribed  - Continue Incentive spirometer 10x/hour  - Add flutter valve THREE times a day   Follow-up: - 2-3 months with Dr. GPatsey Berthold  Chronic respiratory [place] with [place] ([place] - Stable; Continue oxygen on exertion to maintain O2 >88%  - Orders: Renew physical therapy with facility or refer to pulmonary rehab re: chronic respiratory failure    EMartyn Ehrich NP 01/10/2020

## 2019-12-28 ENCOUNTER — Ambulatory Visit
Admission: RE | Admit: 2019-12-28 | Discharge: 2019-12-28 | Disposition: A | Discharge status: Home / Self Care | Payer: No Typology Code available for payment source | Source: Ambulatory Visit | Attending: Primary Care | Admitting: Primary Care

## 2019-12-28 ENCOUNTER — Telehealth: Payer: Self-pay | Admitting: Primary Care

## 2019-12-28 ENCOUNTER — Other Ambulatory Visit: Payer: Self-pay

## 2019-12-28 DIAGNOSIS — R7989 Other specified abnormal findings of blood chemistry: Secondary | ICD-10-CM | POA: Insufficient documentation

## 2019-12-28 MED ORDER — IOHEXOL 350 MG/ML SOLN
75.0000 mL | Freq: Once | INTRAVENOUS | Status: AC | PRN
  Administered 2019-12-28: 75 mL via INTRAVENOUS

## 2019-12-28 NOTE — Progress Notes (Signed)
D-dimer was elevated. Needs CTA r/o PE and bilateral lower extremity dopplers.

## 2019-12-28 NOTE — Progress Notes (Signed)
Please let patient know CTA was negative for pulmonary embolism. Lungs were clear except for some atelectasis right lung. Encourage deep breathing with IS if he has one. He did have some thickening changes of his lower ribs, he needs to follow-up with PCP regarding this new finding. May need additional testing.

## 2019-12-28 NOTE — Telephone Encounter (Signed)
Records have been received and faxed to [facility] office for [facility] to review.  Routing to [employer] as an Financial planner.

## 2019-12-28 NOTE — Telephone Encounter (Signed)
Thank you :)

## 2019-12-29 ENCOUNTER — Ambulatory Visit (HOSPITAL_COMMUNITY): Payer: No Typology Code available for payment source

## 2019-12-29 ENCOUNTER — Other Ambulatory Visit: Payer: Self-pay

## 2019-12-29 ENCOUNTER — Telehealth: Payer: Self-pay | Admitting: Primary Care

## 2019-12-29 ENCOUNTER — Ambulatory Visit
Admission: RE | Admit: 2019-12-29 | Discharge: 2019-12-29 | Disposition: A | Discharge status: Home / Self Care | Payer: No Typology Code available for payment source | Source: Ambulatory Visit | Attending: Primary Care | Admitting: Primary Care

## 2019-12-29 DIAGNOSIS — R7989 Other specified abnormal findings of blood chemistry: Secondary | ICD-10-CM

## 2019-12-29 LAB — IGE: IgE (Immunoglobulin E), Serum: 57 IU/mL (ref 6–495)

## 2019-12-29 NOTE — Telephone Encounter (Signed)
Spoke with Lupita Leash with [facility] ultrasound.  Patient had bilateral Venous Lower extremity dopplers, it was negative for DVT.  Nothing further needed.  Beth, just an [place].  IMPRESSION: No evidence of deep venous thrombosis in either lower extremity.

## 2019-12-29 NOTE — Progress Notes (Signed)
Please let patient know dopplers were negative for DVT

## 2019-12-29 NOTE — Telephone Encounter (Signed)
error 

## 2020-01-05 ENCOUNTER — Telehealth: Payer: Self-pay | Admitting: Primary Care

## 2020-01-05 NOTE — Telephone Encounter (Signed)
Medical Records from [facility] have been received via interoffice from [place] and placed in [employer].

## 2020-01-10 ENCOUNTER — Encounter: Payer: Self-pay | Admitting: Primary Care

## 2020-01-10 DIAGNOSIS — J9611 Chronic respiratory failure with hypoxia: Secondary | ICD-10-CM | POA: Insufficient documentation

## 2020-01-10 NOTE — Assessment & Plan Note (Addendum)
-   PFTs in April 2021 showed mixture of restrictive and obstructive physiology. 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 Dyspnea felt to be d.t underlying cardiac conditions. Cough has improved some, still has congestion with clear-tan sputum. CXR in September showed clear lungs. [facility] [place] He may need consideration for cardiac catheterization.   Plan: - Continue Breztri two puffs twice a day (please add spacer to use with inhaler) - Continue mucinex twice daily, Singulair 10mg  at bedtime and nasal sprays as prescribed  - Continue Incentive spirometer 10x/hour  - Add flutter valve THREE times a day   Follow-up: - 2-3 months with Dr. 

## 2020-01-10 NOTE — Assessment & Plan Note (Signed)
-   Stable; Continue oxygen on exertion to maintain O2 >88%  - Orders: Renew physical therapy with facility or refer to pulmonary rehab re: chronic respiratory failure

## 2020-01-14 NOTE — Progress Notes (Signed)
Agree with the details of the visit as noted by Elizabeth Walsh, NP.  C. Laura Taygen Acklin, MD [facility] 

## 2020-02-29 ENCOUNTER — Ambulatory Visit: Payer: No Typology Code available for payment source | Admitting: Pulmonary Disease

## 2020-03-02 ENCOUNTER — Other Ambulatory Visit: Payer: Self-pay

## 2020-03-02 ENCOUNTER — Encounter: Payer: Self-pay | Admitting: Gastroenterology

## 2020-03-02 ENCOUNTER — Telehealth: Payer: Self-pay

## 2020-03-02 ENCOUNTER — Ambulatory Visit (INDEPENDENT_AMBULATORY_CARE_PROVIDER_SITE_OTHER): Payer: No Typology Code available for payment source | Admitting: Gastroenterology

## 2020-03-02 VITALS — BP 148/81 | HR 96 | Temp 98.1°F

## 2020-03-02 DIAGNOSIS — K640 First degree hemorrhoids: Secondary | ICD-10-CM

## 2020-03-02 DIAGNOSIS — K625 Hemorrhage of anus and rectum: Secondary | ICD-10-CM | POA: Diagnosis not present

## 2020-03-02 NOTE — Progress Notes (Signed)
Arlyss Repress, MD [address]  [address]  [place], [place] [address]  Main: [phone]  Fax: [phone]    Gastroenterology Consultation  Referring Provider:     [facility] Care Physician:  [facility] Primary Gastroenterologist:  Dr. Arlyss Repress Reason for Consultation:     Rectal bleeding, hemorrhoids        HPI:   Raymond Hays is a 74 y.o. male referred by the [facility], [place] for consultation & management of rectal bleeding, hemorrhoids Patient is a veteran with history of A. fib on Eliquis, metabolic syndrome, COPD, bilateral lower extremity weakness, wheelchair dependent lives at long-term care facility.  Patient reports that he has been experiencing severe burning in his rectum associated with bright red blood per rectum which is scant amount.  Patient also reports that he does not know when he has a bowel movement although he experiences rectal discomfort.  He denies having advised to have a BM.  He is on a stool softener on a regular basis.  He has been using hydrocortisone suppository as needed which provides temporary relief.  He is hoping to find a permanent solution for his hemorrhoid symptoms.  He does have fecal soiling, wears depends.  Due to his sedentary lifestyle, he has developed severe skin breakdown of the bilateral buttocks.  He reports having had a colonoscopy several years ago, report not available  He states he has an appointment to see pulmonologist followed by cardiologist at the [facility]  I do not have any labs available with me  Follow-up visit 03/02/20 Patient reports that about few weeks ago, he had a flareup of hemorrhoids resulted in severe burning pain as well as rectal bleeding.  Symptoms relieved with suppository and barrier creams.  Today, he denies any hemorrhoidal symptoms.  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  He is undergoing cardiac work-up at the [facility] by his  cardiologist, reports that he has cardiac catheterization planned for January  NSAIDs: None  Antiplts/Anticoagulants/Anti thrombotics: Eliquis for history of A. fib  GI Procedures: Colonoscopy several years ago at intermediate, report not available He reports having had 2 colonoscopies in his life  Past Medical History:  Diagnosis Date  . Allergic rhinitis   . Anxiety   . Arthritis   . Chronic indwelling Foley catheter   . COPD (chronic obstructive pulmonary disease) (HCC)   . Cough   . Diabetes (HCC)   . Essential (primary) hypertension   . Hemorrhoid   . Hyperlipidemia   . PAF (paroxysmal atrial fibrillation) (HCC)   . Primary osteoarthritis, unspecified shoulder   . Retention of urine, unspecified   . Spinal stenosis   . UTI (urinary tract infection)     Past Surgical History:  Procedure Laterality Date  . APPENDECTOMY    . BACK SURGERY    . ELBOW SURGERY    . HERNIA REPAIR    . KNEE SURGERY    . neck    . PARTIAL HIP ARTHROPLASTY Bilateral   . TONSILLECTOMY      Current Outpatient Medications:  .  acetaminophen (TYLENOL) 325 MG tablet, Take 650 mg by mouth every 4 (four) hours as needed., Disp: , Rfl:  .  Albuterol Sulfate (PROAIR RESPICLICK) 108 (90 Base) MCG/ACT AEPB, Inhale into the lungs., Disp: , Rfl:  .  ANTI-DANDRUFF 1 % SHAM, Apply topically., Disp: , Rfl:  .  apixaban (ELIQUIS) 5 MG TABS tablet,  Take 5 mg by mouth 2 (two) times daily. , Disp: , Rfl:  .  ascorbic acid (VITAMIN C) 500 MG tablet, Take by mouth. , Disp: , Rfl:  .  benzonatate (TESSALON) 100 MG capsule, Take by mouth 2 (two) times daily as needed for cough., Disp: , Rfl:  .  bisacodyl (DULCOLAX) 10 MG suppository, Place 10 mg rectally as needed for moderate constipation., Disp: , Rfl:  .  Budeson-Glycopyrrol-Formoterol (BREZTRI AEROSPHERE) 160-9-4.8 MCG/ACT AERO, Inhale 2 puffs into the lungs in the morning and at bedtime., Disp: , Rfl:  .  calcium carbonate (TUMS - DOSED IN MG ELEMENTAL  CALCIUM) 500 MG chewable tablet, Chew 2 tablets by mouth every 6 (six) hours as needed for indigestion or heartburn., Disp: , Rfl:  .  carbidopa-levodopa (SINEMET IR) 25-100 MG tablet, Take 1 tablet by mouth 3 (three) times daily. Start Carbidopa Levodopa as follows: Take 1/2 tablet three times daily, at least 30 minutes before meals (approximately 7am/11am/4pm), for one week Then take 1/2 tablet in the morning, 1/2 tablet in the afternoon, 1 tablet in the evening, at least 30 minutes before meals, for one week Then take 1/2 tablet in the morning, 1 tablet in the afternoon, 1 tablet in the evening, at least 30 minutes before meals, for one week Then take 1 tablet three times daily at 7am/11am/4pm, at least 30 minutes before meals, Disp: 270 tablet, Rfl: 1 .  carboxymethylcellulose (REFRESH PLUS) 0.5 % SOLN, 1 drop as needed., Disp: , Rfl:  .  cetirizine (ZYRTEC) 10 MG tablet, Take 10 mg by mouth daily. , Disp: , Rfl:  .  clobetasol cream (TEMOVATE) 0.05 %, Apply topically., Disp: , Rfl:  .  diphenhydrAMINE-zinc acetate (BENADRYL ITCH STOPPING) cream, Apply topically every 4 (four) hours., Disp: , Rfl:  .  Docusate Sodium (STOOL SOFTENER LAXATIVE PO), Take 1 tablet by mouth in the morning and at bedtime., Disp: , Rfl:  .  Emollient (CETAPHIL) cream, Apply 1 application topically as needed., Disp: , Rfl:  .  Emollient (EUCERIN DAILY PROTECTION/SPF30) LOTN, Apply topically as needed., Disp: , Rfl:  .  escitalopram (LEXAPRO) 5 MG tablet, Take 5 mg by mouth daily., Disp: , Rfl:  .  finasteride (PROSCAR) 5 MG tablet, Take 5 mg by mouth daily., Disp: , Rfl:  .  fluticasone (FLONASE) 50 MCG/ACT nasal spray, Place 1 spray into both nostrils daily., Disp: , Rfl:  .  furosemide (LASIX) 20 MG tablet, Take by mouth., Disp: , Rfl:  .  gabapentin (NEURONTIN) 800 MG tablet, Take 800 mg by mouth 2 (two) times daily. , Disp: , Rfl:  .  guaiFENesin (MUCINEX) 600 MG 12 hr tablet, Take 600 mg by mouth every 12 (twelve)  hours., Disp: , Rfl:  .  hydrocortisone (ANUSOL-HC) 2.5 % rectal cream, Place 1 application rectally 2 (two) times daily., Disp: 30 g, Rfl: 1 .  hydrocortisone cream 0.5 %, Apply topically., Disp: , Rfl:  .  hydrOXYzine (ATARAX/VISTARIL) 25 MG tablet, Take by mouth., Disp: , Rfl:  .  ibuprofen (ADVIL) 200 MG tablet, Take 200 mg by mouth 2 (two) times daily as needed (jaw pain)., Disp: , Rfl:  .  ipratropium (ATROVENT) 0.03 % nasal spray, Place into both nostrils., Disp: , Rfl:  .  ipratropium (ATROVENT) 0.06 % nasal spray, Place into the nose., Disp: , Rfl:  .  lactase (LACTAID) 3000 units tablet, Take 3,000 Units by mouth 3 (three) times daily with meals., Disp: , Rfl:  .  Lidocaine  3 % CREA, Apply topically daily as needed., Disp: , Rfl:  .  lisinopril (ZESTRIL) 2.5 MG tablet, Take 2.5 mg by mouth daily., Disp: , Rfl:  .  loperamide (IMODIUM) 2 MG capsule, Take 4 mg by mouth as needed for diarrhea or loose stools., Disp: , Rfl:  .  metFORMIN (GLUCOPHAGE-XR) 500 MG 24 hr tablet, Take 500 mg by mouth daily. , Disp: , Rfl:  .  methocarbamol (ROBAXIN) 500 MG tablet, Take 1,000 mg by mouth in the morning and at bedtime. , Disp: , Rfl:  .  metoprolol succinate (TOPROL-XL) 50 MG 24 hr tablet, Take 50 mg by mouth daily. , Disp: , Rfl:  .  montelukast (SINGULAIR) 10 MG tablet, Take 10 mg by mouth at bedtime., Disp: , Rfl:  .  nystatin (MYCOSTATIN/NYSTOP) powder, , Disp: , Rfl:  .  psyllium (METAMUCIL) 58.6 % powder, Take 1 packet by mouth daily., Disp: , Rfl:  .  SARNA lotion, Apply topically., Disp: , Rfl:  .  senna-docusate (SENOKOT-S) 8.6-50 MG tablet, Take by mouth., Disp: , Rfl:  .  Skin Protectants, Misc. (MINERIN CREME EX), Apply topically. Apply to feet twice bid, Disp: , Rfl:  .  tamsulosin (FLOMAX) 0.4 MG CAPS capsule, Take 0.4 mg by mouth., Disp: , Rfl:  .  torsemide (DEMADEX) 10 MG tablet, Take 10 mg by mouth daily., Disp: , Rfl:  .  traMADol (ULTRAM) 50 MG tablet, Take 100 mg by mouth in  the morning. , Disp: , Rfl:  .  traZODone (DESYREL) 100 MG tablet, Take 100 mg by mouth at bedtime., Disp: , Rfl:  .  TRELEGY ELLIPTA 100-62.5-25 MCG/INH AEPB, Inhale 1 puff into the lungs daily., Disp: , Rfl:  .  triamcinolone cream (KENALOG) 0.1 %, Apply 1 application topically 2 (two) times daily., Disp: , Rfl:    Family History  Problem Relation Age of Onset  . Healthy Son   . Healthy Son      Social History   Tobacco Use  . Smoking status: Former Smoker    Packs/day: 0.50    Years: 10.00    Pack years: 5.00    Types: Cigarettes    Quit date: 2006    Years since quitting: 15.9  . Smokeless tobacco: Never Used  Vaping Use  . Vaping Use: Never used  Substance Use Topics  . Alcohol use: Not Currently  . Drug use: Not Currently    Types: "Crack" cocaine    Comment: quit "crack" 15 years ago--11/04/2019    Allergies as of 03/02/2020  . (No Known Allergies)    Review of Systems:    All systems reviewed and negative except where noted in HPI.   Physical Exam:  BP (!) 148/81 (BP Location: Left Arm, Patient Position: Sitting, Cuff Size: Normal)   Pulse 96   Temp 98.1 F (36.7 C) (Oral)  No LMP for male patient.  General:   Alert,  Well-developed, well-nourished, pleasant and cooperative in NAD Head:  Normocephalic and atraumatic. Eyes:  Sclera clear, no icterus.   Conjunctiva pink. Ears:  Normal auditory acuity. Nose:  No deformity, discharge, or lesions. Mouth:  No deformity or lesions,oropharynx pink & moist. Neck:  Supple; no masses or thyromegaly. Lungs:  Respirations even and unlabored.  Exertional dyspnea, bilateral crackles Heart: Increased rate and irregular rhythm; no murmurs, clicks, rubs, or gallops. Abdomen:  Normal bowel sounds. Soft, non-tender and non-distended without masses, hepatosplenomegaly or hernias noted.  No guarding or rebound tenderness.   Rectal:  Erythematous skin rash on buttocks with breakdown of the skin as well as perianal region.  Decreased sphincter tone. Formed stool felt in the anal canal as well as rectal vault, stool mixed with blood Msk: Bilateral upper and lower extremity weakness Extremities:  No clubbing or edema.  No cyanosis. Neurologic:  Alert and oriented x3;  grossly normal neurologically. Psych:  Alert and cooperative. Normal mood and affect.  Imaging Studies: Not available  Assessment and Plan:   Clyda GreenerSteven Hays is a 74 y.o. male veteran with metabolic syndrome, COPD, A. fib on Eliquis is seen in consultation for chronic history of rectal bleeding, rectal pain/burning, discomfort. His symptoms are likely combination of symptomatic external hemorrhoids and breakdown of the perianal skin  Discussed with him about limited treatment options given his sedentary lifestyle Trial of Anusol cream or suppository 2 times daily for 1 week per rectum as needed during flareups Avoid constipation Keep the perianal skin dry I do not recommend hemorrhoid ligation nor hemorrhoid surgery other than medical management at this time   Follow up as needed   Arlyss Repressohini R Demone Lyles, MD

## 2020-03-02 NOTE — Telephone Encounter (Signed)
They did not accept the form the first time because I put 2000 to current. I mark through 2000 initial and put today's date like you are supposed to when you are correcting a document. Fixed the records and said from 04/01/2013 to current

## 2020-03-02 NOTE — Telephone Encounter (Signed)
[facility] referral calling back stating the release form they received has today's date on it and not a range, so she can only send records from today (which they don't have). Please resend release form with date range and which records are needed, or they will not send anything.

## 2020-03-02 NOTE — Telephone Encounter (Signed)
Called [facility] medical released department to find out why they have not sent medical records. They state they have not received a medical released form. Informed them that I had faxed it to them back in June and July. Refax it to them and asked again for fax number which is [phone]. Faxed the form to them

## 2020-03-30 ENCOUNTER — Emergency Department
Admission: EM | Admit: 2020-03-30 | Discharge: 2020-03-30 | Disposition: A | Discharge status: Home / Self Care | Payer: No Typology Code available for payment source | Attending: Emergency Medicine | Admitting: Emergency Medicine

## 2020-03-30 ENCOUNTER — Other Ambulatory Visit: Payer: Self-pay

## 2020-03-30 ENCOUNTER — Encounter: Payer: Self-pay | Admitting: Emergency Medicine

## 2020-03-30 DIAGNOSIS — E119 Type 2 diabetes mellitus without complications: Secondary | ICD-10-CM | POA: Insufficient documentation

## 2020-03-30 DIAGNOSIS — Z466 Encounter for fitting and adjustment of urinary device: Secondary | ICD-10-CM | POA: Diagnosis not present

## 2020-03-30 DIAGNOSIS — Z7984 Long term (current) use of oral hypoglycemic drugs: Secondary | ICD-10-CM | POA: Insufficient documentation

## 2020-03-30 DIAGNOSIS — Z8616 Personal history of COVID-19: Secondary | ICD-10-CM | POA: Diagnosis not present

## 2020-03-30 DIAGNOSIS — J449 Chronic obstructive pulmonary disease, unspecified: Secondary | ICD-10-CM | POA: Insufficient documentation

## 2020-03-30 DIAGNOSIS — I4891 Unspecified atrial fibrillation: Secondary | ICD-10-CM | POA: Insufficient documentation

## 2020-03-30 DIAGNOSIS — Z96643 Presence of artificial hip joint, bilateral: Secondary | ICD-10-CM | POA: Insufficient documentation

## 2020-03-30 DIAGNOSIS — Z7951 Long term (current) use of inhaled steroids: Secondary | ICD-10-CM | POA: Insufficient documentation

## 2020-03-30 DIAGNOSIS — Z79899 Other long term (current) drug therapy: Secondary | ICD-10-CM | POA: Insufficient documentation

## 2020-03-30 DIAGNOSIS — Z87891 Personal history of nicotine dependence: Secondary | ICD-10-CM | POA: Diagnosis not present

## 2020-03-30 DIAGNOSIS — N401 Enlarged prostate with lower urinary tract symptoms: Secondary | ICD-10-CM | POA: Diagnosis not present

## 2020-03-30 DIAGNOSIS — I10 Essential (primary) hypertension: Secondary | ICD-10-CM | POA: Diagnosis not present

## 2020-03-30 DIAGNOSIS — Z7901 Long term (current) use of anticoagulants: Secondary | ICD-10-CM | POA: Insufficient documentation

## 2020-03-30 DIAGNOSIS — R39198 Other difficulties with micturition: Secondary | ICD-10-CM | POA: Diagnosis present

## 2020-03-30 NOTE — ED Provider Notes (Signed)
[facility] Emergency Department Provider Note  ____________________________________________  Time seen: Approximately 4:47 PM  I have reviewed the triage vital signs and the nursing notes.   HISTORY  Chief Complaint Foley Replacement    HPI Raymond Hays is a 74 y.o. male who presents the emergency department complaining of inability to urinate.  Patient states that he has to have an indwelling Foley due to BPH.  Patient states that they were attempting to change his Foley today when they could not reinsert a second Foley.  Patient states there was no issues with his original 1 but they were "changing it out."  Patient states that when they went to reinsert they could not advance the catheter.  Patient states that he has suprapubic pressure as he has been unable to urinate since they removed his catheter.  He had had no symptoms prior to the attempt to change out his catheter.  Patient states that they were unable to perform bladder scan at the facility due to their ultrasound being down.         Past Medical History:  Diagnosis Date  . Allergic rhinitis   . Anxiety   . Arthritis   . Chronic indwelling Foley catheter   . COPD (chronic obstructive pulmonary disease) (HCC)   . Cough   . Diabetes (HCC)   . Essential (primary) hypertension   . Hemorrhoid   . Hyperlipidemia   . PAF (paroxysmal atrial fibrillation) (HCC)   . Primary osteoarthritis, unspecified shoulder   . Retention of urine, unspecified   . Spinal stenosis   . UTI (urinary tract infection)     Patient Active Problem List   Diagnosis Date Noted  . Chronic respiratory failure with hypoxia (HCC) 01/10/2020  . Impaired functional mobility, balance, gait, and endurance 09/15/2019  . Catheter cystitis (HCC) 10/16/2018  . COVID-19 virus detected 07/03/2018  . Benign prostatic hyperplasia with lower urinary tract symptoms 08/06/2017  . Osteoarthritis of right knee 06/19/2017  . S/P orthopedic  surgery, follow-up exam 10/25/2016  . Tendon rupture of wrist, sequela 01/02/2016  . Chronic pain of right knee 08/03/2015  . Calcific tendinitis of left shoulder 07/05/2015  . 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/05/2015  . Gait difficulty 06/26/2015  . Severe mitral regurgitation 06/05/2015  . Atrial fibrillation (HCC) 05/31/2015  . Chronic pain 05/31/2015  . COPD (chronic obstructive pulmonary disease) (HCC) 05/31/2015  . Essential hypertension 05/31/2015  . Chronic pain of left wrist 04/24/2015  . Arthritis of left wrist 04/24/2015  . Rupture of extensor tendon of left hand 04/24/2015    Past Surgical History:  Procedure Laterality Date  . APPENDECTOMY    . BACK SURGERY    . ELBOW SURGERY    . HERNIA REPAIR    . KNEE SURGERY    . neck    . PARTIAL HIP ARTHROPLASTY Bilateral   . TONSILLECTOMY      Prior to Admission medications   Medication Sig Start Date End Date Taking? Authorizing Provider  acetaminophen (TYLENOL) 325 MG tablet Take 650 mg by mouth every 4 (four) hours as needed.    [provider]  Albuterol Sulfate (PROAIR RESPICLICK) 108 (90 Base) MCG/ACT AEPB Inhale into the lungs.    [provider]  ANTI-DANDRUFF 1 % SHAM Apply topically. 02/18/20   [provider]  apixaban (ELIQUIS) 5 MG TABS tablet Take 5 mg by mouth 2 (two) times daily.  02/06/17   [provider]  ascorbic acid (VITAMIN C) 500  MG tablet Take by mouth.     [provider]  benzonatate (TESSALON) 100 MG capsule Take by mouth 2 (two) times daily as needed for cough.    [provider]  bisacodyl (DULCOLAX) 10 MG suppository Place 10 mg rectally as needed for moderate constipation.    [provider]  Budeson-Glycopyrrol-Formoterol (BREZTRI AEROSPHERE) 160-9-4.8 MCG/ACT AERO Inhale 2 puffs into the lungs in the morning and at bedtime.    [provider]  calcium carbonate (TUMS - DOSED IN MG ELEMENTAL CALCIUM) 500 MG chewable  tablet Chew 2 tablets by mouth every 6 (six) hours as needed for indigestion or heartburn.    [provider]  carbidopa-levodopa (SINEMET IR) 25-100 MG tablet Take 1 tablet by mouth 3 (three) times daily. Start Carbidopa Levodopa as follows: Take 1/2 tablet three times daily, at least 30 minutes before meals (approximately 7am/11am/4pm), for one week Then take 1/2 tablet in the morning, 1/2 tablet in the afternoon, 1 tablet in the evening, at least 30 minutes before meals, for one week Then take 1/2 tablet in the morning, 1 tablet in the afternoon, 1 tablet in the evening, at least 30 minutes before meals, for one week Then take 1 tablet three times daily at 7am/11am/4pm, at least 30 minutes before meals 12/07/19   Tat, Octaviano Batty, DO  carboxymethylcellulose (REFRESH PLUS) 0.5 % SOLN 1 drop as needed.    [provider]  cetirizine (ZYRTEC) 10 MG tablet Take 10 mg by mouth daily.     [provider]  clobetasol cream (TEMOVATE) 0.05 % Apply topically. 01/12/20   [provider]  diphenhydrAMINE-zinc acetate (BENADRYL ITCH STOPPING) cream Apply topically every 4 (four) hours.    [provider]  Docusate Sodium (STOOL SOFTENER LAXATIVE PO) Take 1 tablet by mouth in the morning and at bedtime.    [provider]  Emollient (CETAPHIL) cream Apply 1 application topically as needed.    [provider]  Emollient (EUCERIN DAILY PROTECTION/SPF30) LOTN Apply topically as needed.    [provider]  escitalopram (LEXAPRO) 5 MG tablet Take 5 mg by mouth daily.    [provider]  finasteride (PROSCAR) 5 MG tablet Take 5 mg by mouth daily.    [provider]  fluticasone (FLONASE) 50 MCG/ACT nasal spray Place 1 spray into both nostrils daily.    [provider]  furosemide (LASIX) 20 MG tablet Take by mouth. 01/27/20   [provider]  gabapentin (NEURONTIN) 800 MG tablet Take 800 mg by mouth 2 (two) times  daily.     [provider]  guaiFENesin (MUCINEX) 600 MG 12 hr tablet Take 600 mg by mouth every 12 (twelve) hours.    [provider]  hydrocortisone (ANUSOL-HC) 2.5 % rectal cream Place 1 application rectally 2 (two) times daily. 09/15/19   Toney Reil, MD  hydrocortisone cream 0.5 % Apply topically.    [provider]  hydrOXYzine (ATARAX/VISTARIL) 25 MG tablet Take by mouth. 02/17/20   [provider]  ibuprofen (ADVIL) 200 MG tablet Take 200 mg by mouth 2 (two) times daily as needed (jaw pain).    [provider]  ipratropium (ATROVENT) 0.03 % nasal spray Place into both nostrils. 02/18/20   [provider]  ipratropium (ATROVENT) 0.06 % nasal spray Place into the nose.    [provider]  lactase (LACTAID) 3000 units tablet Take 3,000 Units by mouth 3 (three) times daily with meals.  [provider]  Lidocaine 3 % CREA Apply topically daily as needed.    [provider]  lisinopril (ZESTRIL) 2.5 MG tablet Take 2.5 mg by mouth daily.    [provider]  loperamide (IMODIUM) 2 MG capsule Take 4 mg by mouth as needed for diarrhea or loose stools.    [provider]  metFORMIN (GLUCOPHAGE-XR) 500 MG 24 hr tablet Take 500 mg by mouth daily.     [provider]  methocarbamol (ROBAXIN) 500 MG tablet Take 1,000 mg by mouth in the morning and at bedtime.     [provider]  metoprolol succinate (TOPROL-XL) 50 MG 24 hr tablet Take 50 mg by mouth daily.     [provider]  montelukast (SINGULAIR) 10 MG tablet Take 10 mg by mouth at bedtime.    [provider]  nystatin (MYCOSTATIN/NYSTOP) powder  10/21/18   [provider]  psyllium (METAMUCIL) 58.6 % powder Take 1 packet by mouth daily.    [provider]  SARNA lotion Apply topically. 02/25/20   [provider]  senna-docusate (SENOKOT-S) 8.6-50 MG tablet Take by mouth. 10/21/18    [provider]  Skin Protectants, Misc. (MINERIN CREME EX) Apply topically. Apply to feet twice bid    [provider]  tamsulosin (FLOMAX) 0.4 MG CAPS capsule Take 0.4 mg by mouth.    [provider]  torsemide (DEMADEX) 10 MG tablet Take 10 mg by mouth daily.    [provider]  traMADol (ULTRAM) 50 MG tablet Take 100 mg by mouth in the morning.     [provider]  traZODone (DESYREL) 100 MG tablet Take 100 mg by mouth at bedtime.    [provider]  TRELEGY ELLIPTA 100-62.5-25 MCG/INH AEPB Inhale 1 puff into the lungs daily. 02/17/20   [provider]  triamcinolone cream (KENALOG) 0.1 % Apply 1 application topically 2 (two) times daily.    [provider]    Allergies Patient has no known allergies.  Family History  Problem Relation Age of Onset  . Healthy Son   . Healthy Son     Social History Social History   Tobacco Use  . Smoking status: Former Smoker    Packs/day: 0.50    Years: 10.00    Pack years: 5.00    Types: Cigarettes    Quit date: 2006    Years since quitting: 16.0  . Smokeless tobacco: Never Used  Vaping Use  . Vaping Use: Never used  Substance Use Topics  . Alcohol use: Not Currently  . Drug use: Not Currently    Types: "Crack" cocaine    Comment: quit "crack" 15 years ago--11/04/2019     Review of Systems  Constitutional: No fever/chills Eyes: No visual changes. No discharge ENT: No upper respiratory complaints. Cardiovascular: no chest pain. Respiratory: no cough. No SOB. Gastrointestinal: No abdominal pain.  No nausea, no vomiting.  No diarrhea.  No constipation. Genitourinary: Negative for dysuria. No hematuria.  Need for Foley replacement Musculoskeletal: Negative for musculoskeletal pain. Skin: Negative for rash, abrasions, lacerations, ecchymosis. Neurological: Negative for headaches, focal weakness or numbness.  10 System ROS otherwise  negative.  ____________________________________________   PHYSICAL EXAM:  VITAL SIGNS: ED Triage Vitals  Enc Vitals Group     BP 03/30/20 1535 111/79     Pulse Rate 03/30/20 1535 92     Resp 03/30/20 1535 18     Temp 03/30/20 1534 99 F (37.2 C)  Temp Source 03/30/20 1534 Oral     SpO2 03/30/20 1535 97 %     Weight 03/30/20 1534 230 lb (104.3 kg)     Height 03/30/20 1534 5\' 7"  (1.702 m)     Head Circumference --      Peak Flow --      Pain Score 03/30/20 1534 0     Pain Loc --      Pain Edu? --      Excl. in GC? --      Constitutional: Alert and oriented. Well appearing and in no acute distress. Eyes: Conjunctivae are normal. PERRL. EOMI. Head: Atraumatic. ENT:      Ears:       Nose: No congestion/rhinnorhea.      Mouth/Throat: Mucous membranes are moist.  Neck: No stridor.    Cardiovascular: Normal rate, regular rhythm. Normal S1 and S2.  Good peripheral circulation. Respiratory: Normal respiratory effort without tachypnea or retractions. Lungs CTAB. Good air entry to the bases with no decreased or absent breath sounds. Gastrointestinal: Bowel sounds 4 quadrants. Soft and nontender to palpation. No guarding or rigidity. No palpable masses. No distention. No CVA tenderness. Musculoskeletal: Full range of motion to all extremities. No gross deformities appreciated. Neurologic:  Normal speech and language. No gross focal neurologic deficits are appreciated.  Skin:  Skin is warm, dry and intact. No rash noted. Psychiatric: Mood and affect are normal. Speech and behavior are normal. Patient exhibits appropriate insight and judgement.   ____________________________________________   LABS (all labs ordered are listed, but only abnormal results are displayed)  Labs Reviewed - No data to display ____________________________________________  EKG   ____________________________________________  RADIOLOGY   No results  found.  ____________________________________________    PROCEDURES  Procedure(s) performed:    Procedures    Medications - No data to display   ____________________________________________   INITIAL IMPRESSION / ASSESSMENT AND PLAN / ED COURSE  Pertinent labs & imaging results that were available during my care of the patient were reviewed by me and considered in my medical decision making (see chart for details).  Review of the [place] CSRS was performed in accordance of the [facility] prior to dispensing any controlled drugs.           Patient's diagnosis is consistent with encounter for Foley catheter placement, BPH with urinary obstruction.  Patient presented to emergency department for Foley catheter placement.  Patient states that he has a permanent indwelling Foley cath, the long-term care facility in which she resides was replacing it today and after they had removed his first he could not reinsert a Foley.  We were able to place a Foley without difficulty and he only had 168 mL of urine prior to reinsertion of Foley.  Patient was asymptomatic other than needing a Foley replaced.  At this time no indication for further work-up.  I suspect the patient had spasm of the prostate after removal of his initial catheter as we were able to easily replace his Foley catheter here in the emergency department.  [facility].. Patient is given ED precautions to return to the ED for any worsening or new symptoms.     ____________________________________________  FINAL CLINICAL IMPRESSION(S) / ED DIAGNOSES  Final diagnoses:  Encounter for Foley catheter replacement  Benign prostatic hyperplasia with urinary obstruction      NEW MEDICATIONS STARTED DURING THIS VISIT:  ED Discharge Orders    None  This chart was dictated using voice recognition software/Dragon. Despite best efforts to proofread, errors can occur which can change the  meaning. Any change was purely unintentional.    Racheal Patches, PA-C 03/30/20 1730    Arnaldo Natal, MD 03/30/20 2322

## 2020-03-30 NOTE — ED Triage Notes (Signed)
Pt arrived to ED from [facility] with c/o needing his foley replaced.  Pt NAD at this time.

## 2020-06-06 ENCOUNTER — Ambulatory Visit: Payer: No Typology Code available for payment source | Admitting: Neurology

## 2020-06-27 DIAGNOSIS — Z9889 Other specified postprocedural states: Secondary | ICD-10-CM | POA: Insufficient documentation

## 2020-10-16 ENCOUNTER — Emergency Department: Payer: No Typology Code available for payment source

## 2020-10-16 ENCOUNTER — Encounter: Payer: Self-pay | Admitting: *Deleted

## 2020-10-16 ENCOUNTER — Other Ambulatory Visit: Payer: Self-pay

## 2020-10-16 ENCOUNTER — Inpatient Hospital Stay
Admission: EM | Admit: 2020-10-16 | Discharge: 2020-11-01 | DRG: 872 | Disposition: A | Discharge status: Skilled Nursing Facility | Payer: No Typology Code available for payment source | Source: Skilled Nursing Facility | Attending: Hospitalist | Admitting: Hospitalist

## 2020-10-16 DIAGNOSIS — Z9359 Other cystostomy status: Secondary | ICD-10-CM

## 2020-10-16 DIAGNOSIS — Z9049 Acquired absence of other specified parts of digestive tract: Secondary | ICD-10-CM

## 2020-10-16 DIAGNOSIS — N401 Enlarged prostate with lower urinary tract symptoms: Secondary | ICD-10-CM | POA: Diagnosis present

## 2020-10-16 DIAGNOSIS — A408 Other streptococcal sepsis: Principal | ICD-10-CM | POA: Diagnosis present

## 2020-10-16 DIAGNOSIS — I130 Hypertensive heart and chronic kidney disease with heart failure and stage 1 through stage 4 chronic kidney disease, or unspecified chronic kidney disease: Secondary | ICD-10-CM | POA: Diagnosis present

## 2020-10-16 DIAGNOSIS — J309 Allergic rhinitis, unspecified: Secondary | ICD-10-CM | POA: Diagnosis present

## 2020-10-16 DIAGNOSIS — G20 Parkinson's disease: Secondary | ICD-10-CM | POA: Diagnosis present

## 2020-10-16 DIAGNOSIS — A419 Sepsis, unspecified organism: Secondary | ICD-10-CM | POA: Diagnosis not present

## 2020-10-16 DIAGNOSIS — M4622 Osteomyelitis of vertebra, cervical region: Secondary | ICD-10-CM | POA: Diagnosis not present

## 2020-10-16 DIAGNOSIS — K567 Ileus, unspecified: Secondary | ICD-10-CM | POA: Diagnosis not present

## 2020-10-16 DIAGNOSIS — M542 Cervicalgia: Secondary | ICD-10-CM | POA: Diagnosis not present

## 2020-10-16 DIAGNOSIS — J22 Unspecified acute lower respiratory infection: Secondary | ICD-10-CM

## 2020-10-16 DIAGNOSIS — G20A1 Parkinson's disease without dyskinesia, without mention of fluctuations: Secondary | ICD-10-CM

## 2020-10-16 DIAGNOSIS — K269 Duodenal ulcer, unspecified as acute or chronic, without hemorrhage or perforation: Secondary | ICD-10-CM | POA: Diagnosis present

## 2020-10-16 DIAGNOSIS — Z20822 Contact with and (suspected) exposure to covid-19: Secondary | ICD-10-CM | POA: Diagnosis present

## 2020-10-16 DIAGNOSIS — N4889 Other specified disorders of penis: Secondary | ICD-10-CM | POA: Diagnosis not present

## 2020-10-16 DIAGNOSIS — M25521 Pain in right elbow: Secondary | ICD-10-CM | POA: Diagnosis present

## 2020-10-16 DIAGNOSIS — D689 Coagulation defect, unspecified: Secondary | ICD-10-CM | POA: Diagnosis present

## 2020-10-16 DIAGNOSIS — R059 Cough, unspecified: Secondary | ICD-10-CM

## 2020-10-16 DIAGNOSIS — M8580 Other specified disorders of bone density and structure, unspecified site: Secondary | ICD-10-CM | POA: Diagnosis present

## 2020-10-16 DIAGNOSIS — N182 Chronic kidney disease, stage 2 (mild): Secondary | ICD-10-CM | POA: Diagnosis present

## 2020-10-16 DIAGNOSIS — E1142 Type 2 diabetes mellitus with diabetic polyneuropathy: Secondary | ICD-10-CM | POA: Diagnosis present

## 2020-10-16 DIAGNOSIS — K8020 Calculus of gallbladder without cholecystitis without obstruction: Secondary | ICD-10-CM | POA: Diagnosis present

## 2020-10-16 DIAGNOSIS — R112 Nausea with vomiting, unspecified: Secondary | ICD-10-CM

## 2020-10-16 DIAGNOSIS — A409 Streptococcal sepsis, unspecified: Secondary | ICD-10-CM | POA: Diagnosis not present

## 2020-10-16 DIAGNOSIS — E1165 Type 2 diabetes mellitus with hyperglycemia: Secondary | ICD-10-CM | POA: Diagnosis present

## 2020-10-16 DIAGNOSIS — K921 Melena: Secondary | ICD-10-CM | POA: Diagnosis not present

## 2020-10-16 DIAGNOSIS — Z79899 Other long term (current) drug therapy: Secondary | ICD-10-CM

## 2020-10-16 DIAGNOSIS — N179 Acute kidney failure, unspecified: Secondary | ICD-10-CM | POA: Diagnosis present

## 2020-10-16 DIAGNOSIS — R531 Weakness: Secondary | ICD-10-CM | POA: Diagnosis present

## 2020-10-16 DIAGNOSIS — R6520 Severe sepsis without septic shock: Secondary | ICD-10-CM | POA: Diagnosis present

## 2020-10-16 DIAGNOSIS — Z6838 Body mass index (BMI) 38.0-38.9, adult: Secondary | ICD-10-CM

## 2020-10-16 DIAGNOSIS — J209 Acute bronchitis, unspecified: Secondary | ICD-10-CM | POA: Diagnosis present

## 2020-10-16 DIAGNOSIS — Z8616 Personal history of COVID-19: Secondary | ICD-10-CM

## 2020-10-16 DIAGNOSIS — M25561 Pain in right knee: Secondary | ICD-10-CM | POA: Diagnosis present

## 2020-10-16 DIAGNOSIS — E669 Obesity, unspecified: Secondary | ICD-10-CM | POA: Diagnosis present

## 2020-10-16 DIAGNOSIS — E1140 Type 2 diabetes mellitus with diabetic neuropathy, unspecified: Secondary | ICD-10-CM

## 2020-10-16 DIAGNOSIS — J449 Chronic obstructive pulmonary disease, unspecified: Secondary | ICD-10-CM | POA: Diagnosis present

## 2020-10-16 DIAGNOSIS — Z7984 Long term (current) use of oral hypoglycemic drugs: Secondary | ICD-10-CM

## 2020-10-16 DIAGNOSIS — I480 Paroxysmal atrial fibrillation: Secondary | ICD-10-CM | POA: Diagnosis present

## 2020-10-16 DIAGNOSIS — I059 Rheumatic mitral valve disease, unspecified: Secondary | ICD-10-CM | POA: Diagnosis not present

## 2020-10-16 DIAGNOSIS — E785 Hyperlipidemia, unspecified: Secondary | ICD-10-CM | POA: Diagnosis present

## 2020-10-16 DIAGNOSIS — I361 Nonrheumatic tricuspid (valve) insufficiency: Secondary | ICD-10-CM | POA: Diagnosis not present

## 2020-10-16 DIAGNOSIS — Z7901 Long term (current) use of anticoagulants: Secondary | ICD-10-CM

## 2020-10-16 DIAGNOSIS — B955 Unspecified streptococcus as the cause of diseases classified elsewhere: Secondary | ICD-10-CM | POA: Diagnosis not present

## 2020-10-16 DIAGNOSIS — G8929 Other chronic pain: Secondary | ICD-10-CM | POA: Diagnosis present

## 2020-10-16 DIAGNOSIS — M4802 Spinal stenosis, cervical region: Secondary | ICD-10-CM | POA: Diagnosis present

## 2020-10-16 DIAGNOSIS — N189 Chronic kidney disease, unspecified: Secondary | ICD-10-CM | POA: Diagnosis not present

## 2020-10-16 DIAGNOSIS — E1143 Type 2 diabetes mellitus with diabetic autonomic (poly)neuropathy: Secondary | ICD-10-CM

## 2020-10-16 DIAGNOSIS — J441 Chronic obstructive pulmonary disease with (acute) exacerbation: Secondary | ICD-10-CM | POA: Diagnosis not present

## 2020-10-16 DIAGNOSIS — I340 Nonrheumatic mitral (valve) insufficiency: Secondary | ICD-10-CM | POA: Diagnosis not present

## 2020-10-16 DIAGNOSIS — Z96643 Presence of artificial hip joint, bilateral: Secondary | ICD-10-CM | POA: Diagnosis present

## 2020-10-16 DIAGNOSIS — R7881 Bacteremia: Secondary | ICD-10-CM | POA: Diagnosis not present

## 2020-10-16 DIAGNOSIS — D649 Anemia, unspecified: Secondary | ICD-10-CM | POA: Diagnosis present

## 2020-10-16 DIAGNOSIS — Z87891 Personal history of nicotine dependence: Secondary | ICD-10-CM

## 2020-10-16 DIAGNOSIS — I509 Heart failure, unspecified: Secondary | ICD-10-CM | POA: Diagnosis present

## 2020-10-16 DIAGNOSIS — E1122 Type 2 diabetes mellitus with diabetic chronic kidney disease: Secondary | ICD-10-CM | POA: Diagnosis present

## 2020-10-16 DIAGNOSIS — T380X5A Adverse effect of glucocorticoids and synthetic analogues, initial encounter: Secondary | ICD-10-CM | POA: Diagnosis not present

## 2020-10-16 DIAGNOSIS — F419 Anxiety disorder, unspecified: Secondary | ICD-10-CM | POA: Diagnosis present

## 2020-10-16 DIAGNOSIS — I10 Essential (primary) hypertension: Secondary | ICD-10-CM | POA: Diagnosis not present

## 2020-10-16 DIAGNOSIS — T83038A Leakage of other indwelling urethral catheter, initial encounter: Secondary | ICD-10-CM | POA: Diagnosis not present

## 2020-10-16 DIAGNOSIS — R269 Unspecified abnormalities of gait and mobility: Secondary | ICD-10-CM | POA: Diagnosis present

## 2020-10-16 LAB — CBC WITH DIFFERENTIAL/PLATELET
Abs Immature Granulocytes: 0.25 10*3/uL — ABNORMAL HIGH (ref 0.00–0.07)
Basophils Absolute: 0.1 10*3/uL (ref 0.0–0.1)
Basophils Relative: 0 %
Eosinophils Absolute: 0.1 10*3/uL (ref 0.0–0.5)
Eosinophils Relative: 0 %
HCT: 35 % — ABNORMAL LOW (ref 39.0–52.0)
Hemoglobin: 11.4 g/dL — ABNORMAL LOW (ref 13.0–17.0)
Immature Granulocytes: 1 %
Lymphocytes Relative: 3 %
Lymphs Abs: 0.6 10*3/uL — ABNORMAL LOW (ref 0.7–4.0)
MCH: 28.3 pg (ref 26.0–34.0)
MCHC: 32.6 g/dL (ref 30.0–36.0)
MCV: 86.8 fL (ref 80.0–100.0)
Monocytes Absolute: 0.8 10*3/uL (ref 0.1–1.0)
Monocytes Relative: 4 %
Neutro Abs: 16.3 10*3/uL — ABNORMAL HIGH (ref 1.7–7.7)
Neutrophils Relative %: 92 %
Platelets: 196 10*3/uL (ref 150–400)
RBC: 4.03 MIL/uL — ABNORMAL LOW (ref 4.22–5.81)
RDW: 15.9 % — ABNORMAL HIGH (ref 11.5–15.5)
WBC: 18 10*3/uL — ABNORMAL HIGH (ref 4.0–10.5)
nRBC: 0 % (ref 0.0–0.2)

## 2020-10-16 LAB — URINALYSIS, COMPLETE (UACMP) WITH MICROSCOPIC
Bacteria, UA: NONE SEEN
Bilirubin Urine: NEGATIVE
Glucose, UA: NEGATIVE mg/dL
Ketones, ur: NEGATIVE mg/dL
Leukocytes,Ua: NEGATIVE
Nitrite: NEGATIVE
Protein, ur: NEGATIVE mg/dL
Specific Gravity, Urine: 1.018 (ref 1.005–1.030)
Squamous Epithelial / HPF: NONE SEEN (ref 0–5)
pH: 5 (ref 5.0–8.0)

## 2020-10-16 LAB — COMPREHENSIVE METABOLIC PANEL
ALT: 6 U/L (ref 0–44)
AST: 14 U/L — ABNORMAL LOW (ref 15–41)
Albumin: 3.2 g/dL — ABNORMAL LOW (ref 3.5–5.0)
Alkaline Phosphatase: 72 U/L (ref 38–126)
Anion gap: 11 (ref 5–15)
BUN: 38 mg/dL — ABNORMAL HIGH (ref 8–23)
CO2: 24 mmol/L (ref 22–32)
Calcium: 8.8 mg/dL — ABNORMAL LOW (ref 8.9–10.3)
Chloride: 102 mmol/L (ref 98–111)
Creatinine, Ser: 1.61 mg/dL — ABNORMAL HIGH (ref 0.61–1.24)
GFR, Estimated: 44 mL/min — ABNORMAL LOW (ref >60)
Glucose, Bld: 153 mg/dL — ABNORMAL HIGH (ref 70–99)
Potassium: 4.7 mmol/L (ref 3.5–5.1)
Sodium: 137 mmol/L (ref 135–145)
Total Bilirubin: 1.2 mg/dL (ref 0.3–1.2)
Total Protein: 7.1 g/dL (ref 6.5–8.1)

## 2020-10-16 LAB — RESP PANEL BY RT-PCR (FLU A&B, COVID) ARPGX2
Influenza A by PCR: NEGATIVE
Influenza B by PCR: NEGATIVE
SARS Coronavirus 2 by RT PCR: NEGATIVE

## 2020-10-16 LAB — PROCALCITONIN: Procalcitonin: 5.78 ng/mL

## 2020-10-16 LAB — MAGNESIUM: Magnesium: 1.9 mg/dL (ref 1.7–2.4)

## 2020-10-16 LAB — LACTIC ACID, PLASMA
Lactic Acid, Venous: 1.4 mmol/L (ref 0.5–1.9)
Lactic Acid, Venous: 1.6 mmol/L (ref 0.5–1.9)

## 2020-10-16 MED ORDER — BISACODYL 10 MG RE SUPP
10.0000 mg | RECTAL | Status: DC | PRN
  Administered 2020-10-24: 10 mg via RECTAL
  Filled 2020-10-16 (×2): qty 1

## 2020-10-16 MED ORDER — SENNOSIDES-DOCUSATE SODIUM 8.6-50 MG PO TABS: 1.0000 | ORAL_TABLET | Freq: Every evening | ORAL | Status: DC | PRN

## 2020-10-16 MED ORDER — ACETAMINOPHEN 325 MG PO TABS
650.0000 mg | ORAL_TABLET | Freq: Four times a day (QID) | ORAL | Status: DC | PRN
  Administered 2020-10-20 – 2020-10-26 (×8): 650 mg via ORAL
  Filled 2020-10-16 (×8): qty 2

## 2020-10-16 MED ORDER — METOPROLOL SUCCINATE ER 50 MG PO TB24
50.0000 mg | ORAL_TABLET | Freq: Every day | ORAL | Status: DC
  Administered 2020-10-17 – 2020-11-01 (×14): 50 mg via ORAL
  Filled 2020-10-16 (×15): qty 1

## 2020-10-16 MED ORDER — ACETAMINOPHEN 500 MG PO TABS
1000.0000 mg | ORAL_TABLET | Freq: Once | ORAL | Status: AC
  Administered 2020-10-16: 1000 mg via ORAL
  Filled 2020-10-16: qty 2

## 2020-10-16 MED ORDER — IPRATROPIUM BROMIDE 0.03 % NA SOLN
1.0000 | Freq: Two times a day (BID) | NASAL | Status: DC
  Administered 2020-10-19 – 2020-11-01 (×22): 1 via NASAL
  Filled 2020-10-16 (×2): qty 30

## 2020-10-16 MED ORDER — CARBIDOPA-LEVODOPA 25-100 MG PO TABS: 1.0000 | ORAL_TABLET | Freq: Three times a day (TID) | ORAL | Status: DC

## 2020-10-16 MED ORDER — ENOXAPARIN SODIUM 40 MG/0.4ML IJ SOSY: 40.0000 mg | PREFILLED_SYRINGE | INTRAMUSCULAR | Status: DC

## 2020-10-16 MED ORDER — GABAPENTIN 400 MG PO CAPS
800.0000 mg | ORAL_CAPSULE | Freq: Two times a day (BID) | ORAL | Status: DC
  Administered 2020-10-16 – 2020-11-01 (×31): 800 mg via ORAL
  Filled 2020-10-16 (×33): qty 2

## 2020-10-16 MED ORDER — METHOCARBAMOL 500 MG PO TABS
1000.0000 mg | ORAL_TABLET | Freq: Three times a day (TID) | ORAL | Status: DC | PRN
  Administered 2020-10-16 – 2020-10-25 (×10): 1000 mg via ORAL
  Filled 2020-10-16 (×12): qty 2

## 2020-10-16 MED ORDER — GABAPENTIN 800 MG PO TABS
800.0000 mg | ORAL_TABLET | Freq: Two times a day (BID) | ORAL | Status: DC
  Filled 2020-10-16: qty 1

## 2020-10-16 MED ORDER — CALCIUM CARBONATE ANTACID 500 MG PO CHEW: 2.0000 | CHEWABLE_TABLET | Freq: Four times a day (QID) | ORAL | Status: DC | PRN

## 2020-10-16 MED ORDER — SODIUM CHLORIDE 0.9 % IV BOLUS
1000.0000 mL | Freq: Once | INTRAVENOUS | Status: AC
  Administered 2020-10-16: 1000 mL via INTRAVENOUS

## 2020-10-16 MED ORDER — SODIUM CHLORIDE 0.9 % IV SOLN
500.0000 mg | INTRAVENOUS | Status: DC
  Filled 2020-10-16: qty 500

## 2020-10-16 MED ORDER — ONDANSETRON HCL 4 MG PO TABS
4.0000 mg | ORAL_TABLET | Freq: Four times a day (QID) | ORAL | Status: DC | PRN
  Administered 2020-10-21: 4 mg via ORAL
  Filled 2020-10-16: qty 1

## 2020-10-16 MED ORDER — ONDANSETRON HCL 4 MG/2ML IJ SOLN
4.0000 mg | Freq: Four times a day (QID) | INTRAMUSCULAR | Status: DC | PRN
  Filled 2020-10-16: qty 2

## 2020-10-16 MED ORDER — MAGNESIUM HYDROXIDE 400 MG/5ML PO SUSP: 30.0000 mL | Freq: Every day | ORAL | Status: DC | PRN

## 2020-10-16 MED ORDER — GUAIFENESIN ER 600 MG PO TB12
600.0000 mg | ORAL_TABLET | Freq: Two times a day (BID) | ORAL | Status: DC
  Administered 2020-10-16 – 2020-11-01 (×31): 600 mg via ORAL
  Filled 2020-10-16 (×31): qty 1

## 2020-10-16 MED ORDER — TRAZODONE HCL 100 MG PO TABS
100.0000 mg | ORAL_TABLET | Freq: Every day | ORAL | Status: DC
  Administered 2020-10-16 – 2020-10-31 (×16): 100 mg via ORAL
  Filled 2020-10-16 (×17): qty 1

## 2020-10-16 MED ORDER — TAMSULOSIN HCL 0.4 MG PO CAPS
0.4000 mg | ORAL_CAPSULE | Freq: Every day | ORAL | Status: DC
  Administered 2020-10-17 – 2020-11-01 (×15): 0.4 mg via ORAL
  Filled 2020-10-16 (×14): qty 1

## 2020-10-16 MED ORDER — CARBIDOPA-LEVODOPA 25-100 MG PO TABS
1.0000 | ORAL_TABLET | Freq: Three times a day (TID) | ORAL | Status: DC
  Administered 2020-10-17 – 2020-11-01 (×43): 1 via ORAL
  Filled 2020-10-16 (×43): qty 1

## 2020-10-16 MED ORDER — FLUTICASONE PROPIONATE 50 MCG/ACT NA SUSP
1.0000 | Freq: Every day | NASAL | Status: DC
  Administered 2020-10-17 – 2020-11-01 (×13): 1 via NASAL
  Filled 2020-10-16: qty 16

## 2020-10-16 MED ORDER — SODIUM CHLORIDE 0.9 % IV SOLN
500.0000 mg | Freq: Once | INTRAVENOUS | Status: AC
  Administered 2020-10-16: 500 mg via INTRAVENOUS
  Filled 2020-10-16: qty 500

## 2020-10-16 MED ORDER — ACETAMINOPHEN 650 MG RE SUPP: 650.0000 mg | Freq: Four times a day (QID) | RECTAL | Status: DC | PRN

## 2020-10-16 MED ORDER — APIXABAN 5 MG PO TABS
5.0000 mg | ORAL_TABLET | Freq: Two times a day (BID) | ORAL | Status: DC
  Administered 2020-10-16 – 2020-10-25 (×18): 5 mg via ORAL
  Filled 2020-10-16 (×19): qty 1

## 2020-10-16 MED ORDER — HYDROXYZINE HCL 25 MG PO TABS
25.0000 mg | ORAL_TABLET | Freq: Three times a day (TID) | ORAL | Status: DC | PRN
  Filled 2020-10-16 (×2): qty 1

## 2020-10-16 MED ORDER — TRAZODONE HCL 50 MG PO TABS: 25.0000 mg | ORAL_TABLET | Freq: Every evening | ORAL | Status: DC | PRN

## 2020-10-16 MED ORDER — ASCORBIC ACID 500 MG PO TABS
500.0000 mg | ORAL_TABLET | Freq: Every day | ORAL | Status: DC
  Administered 2020-10-17 – 2020-11-01 (×15): 500 mg via ORAL
  Filled 2020-10-16 (×15): qty 1

## 2020-10-16 MED ORDER — IPRATROPIUM-ALBUTEROL 0.5-2.5 (3) MG/3ML IN SOLN
3.0000 mL | Freq: Four times a day (QID) | RESPIRATORY_TRACT | Status: DC
  Administered 2020-10-17: 3 mL via RESPIRATORY_TRACT
  Filled 2020-10-16: qty 3

## 2020-10-16 MED ORDER — LACTASE 3000 UNITS PO TABS
3000.0000 [IU] | ORAL_TABLET | Freq: Three times a day (TID) | ORAL | Status: DC
  Administered 2020-10-17 – 2020-11-01 (×44): 3000 [IU] via ORAL
  Filled 2020-10-16 (×48): qty 1

## 2020-10-16 MED ORDER — SODIUM CHLORIDE 0.9 % IV SOLN
2.0000 g | INTRAVENOUS | Status: DC
  Administered 2020-10-17 – 2020-10-24 (×8): 2 g via INTRAVENOUS
  Filled 2020-10-16 (×6): qty 2
  Filled 2020-10-16 (×2): qty 20
  Filled 2020-10-16: qty 2

## 2020-10-16 MED ORDER — MONTELUKAST SODIUM 10 MG PO TABS
10.0000 mg | ORAL_TABLET | Freq: Every day | ORAL | Status: DC
  Administered 2020-10-16 – 2020-10-31 (×16): 10 mg via ORAL
  Filled 2020-10-16 (×17): qty 1

## 2020-10-16 MED ORDER — LORATADINE 10 MG PO TABS
10.0000 mg | ORAL_TABLET | Freq: Every day | ORAL | Status: DC
  Administered 2020-10-17 – 2020-11-01 (×15): 10 mg via ORAL
  Filled 2020-10-16 (×15): qty 1

## 2020-10-16 MED ORDER — SODIUM CHLORIDE 0.9 % IV SOLN: INTRAVENOUS | Status: DC

## 2020-10-16 MED ORDER — MORPHINE SULFATE (PF) 2 MG/ML IV SOLN
2.0000 mg | INTRAVENOUS | Status: DC | PRN
  Administered 2020-10-17 – 2020-10-22 (×2): 2 mg via INTRAVENOUS
  Filled 2020-10-16 (×3): qty 1

## 2020-10-16 MED ORDER — FINASTERIDE 5 MG PO TABS
5.0000 mg | ORAL_TABLET | Freq: Every day | ORAL | Status: DC
  Administered 2020-10-17 – 2020-11-01 (×15): 5 mg via ORAL
  Filled 2020-10-16 (×16): qty 1

## 2020-10-16 MED ORDER — LOPERAMIDE HCL 2 MG PO CAPS
4.0000 mg | ORAL_CAPSULE | ORAL | Status: DC | PRN
  Administered 2020-10-18: 4 mg via ORAL
  Filled 2020-10-16: qty 2

## 2020-10-16 MED ORDER — ESCITALOPRAM OXALATE 10 MG PO TABS
5.0000 mg | ORAL_TABLET | Freq: Every day | ORAL | Status: DC
  Administered 2020-10-17 – 2020-11-01 (×15): 5 mg via ORAL
  Filled 2020-10-16 (×16): qty 0.5

## 2020-10-16 MED ORDER — HYDROCOD POLST-CPM POLST ER 10-8 MG/5ML PO SUER
5.0000 mL | Freq: Two times a day (BID) | ORAL | Status: DC | PRN
  Administered 2020-10-17 – 2020-10-21 (×4): 5 mL via ORAL
  Filled 2020-10-16 (×5): qty 5

## 2020-10-16 MED ORDER — SODIUM CHLORIDE 0.9 % IV SOLN
1.0000 g | Freq: Once | INTRAVENOUS | Status: AC
  Administered 2020-10-16: 1 g via INTRAVENOUS
  Filled 2020-10-16: qty 10

## 2020-10-16 NOTE — ED Notes (Signed)
Pt reports weakness for 4 days from [facility].  Pt has a suprapubic cath that had been in place for a few days.  Pt also has sob with movement.  Vomited x 1 yesterday.  No chest pain.  Pt alert  Iv started, labs sent.

## 2020-10-16 NOTE — H&P (Signed)
[place]   PATIENT NAME: Raymond Hays    MR#:  [id number]  DATE OF BIRTH:  04-08-1945  DATE OF ADMISSION:  10/16/2020  PRIMARY CARE PHYSICIAN: [facility]   Patient is coming from: SNF  REQUESTING/REFERRING PHYSICIAN: Delton Prairie, MD  CHIEF COMPLAINT:   Chief Complaint  Patient presents with  . Weakness    HISTORY OF PRESENT ILLNESS:  Raymond Hays is a 75 y.o. Caucasian male with medical history significant for type 2 diabetes mellitus, dyslipidemia, hypertension, COPD, and anxiety and allergic rhinitis, who presented to the ER with acute onset of generalized weakness over the last 4 days at her SNF.  He has been having diminished appetite and decreased urine output.  He complains of increased cough with sputum production which he has been swallowing.  He was noted to have wheezing.  He admitted to dyspnea on exertion.  No dyspnea at rest or chest pain or palpitations.  No fever or chills.  No nausea or vomiting today but he had vomiting yesterday and denies abdominal pain or diarrhea.  He denied any headache or dizziness or blurred vision.  No dysuria, oliguria or hematuria or flank pain.  He has been having neck pain as well as right knee pain that gives him difficulty sitting up.  ED Course: When he came to the ER his respiratory rate was 21 and later 22 and BP was 93/61.  Later heart rate was in the 40s.  Pulse oximetry was 87% on room air and later 94 to 95%.  Labs revealed a BUN of 38 and creatinine 1.61 up from 1.43 on 12/15/2019 and 0.95 on 11/14/2019.  Lactic acid was 1.4 and procalcitonin 5.78.  CBC showed leukocytosis of 18 with neutrophilia and mild anemia.  Influenza antigens and COVID-19 PCR came back negative.  UA showed no significant abnormalities. EKG as reviewed by me : Showed sinus rhythm with rate of 84 with PACs, short PR interval and nonspecific IVCD with T wave version inferolaterally. Imaging: Chest x-ray showed low lung volumes and mild cardiomegaly  with slight central congestion.  Noncontrast head CT scan showed no acute intracranial normalities as below. Chest CT without contrast as described below showed cholelithiasis and no pneumonia.  The patient was given IV Rocephin and Zithromax, a gram of p.o. Tylenol and 1 L bolus of IV normal saline.  He will be admitted to a medical monitored bed for further evaluation and management. PAST MEDICAL HISTORY:   Past Medical History:  Diagnosis Date  . Allergic rhinitis   . Anxiety   . Arthritis   . Chronic indwelling Foley catheter   . COPD (chronic obstructive pulmonary disease) (HCC)   . Cough   . Diabetes (HCC)   . Essential (primary) hypertension   . Hemorrhoid   . Hyperlipidemia   . PAF (paroxysmal atrial fibrillation) (HCC)   . Primary osteoarthritis, unspecified shoulder   . Retention of urine, unspecified   . Spinal stenosis   . UTI (urinary tract infection)     PAST SURGICAL HISTORY:   Past Surgical History:  Procedure Laterality Date  . APPENDECTOMY    . BACK SURGERY    . ELBOW SURGERY    . HERNIA REPAIR    . KNEE SURGERY    . neck    . PARTIAL HIP ARTHROPLASTY Bilateral   . TONSILLECTOMY      SOCIAL HISTORY:   Social History   Tobacco Use  . Smoking status: Former  Packs/day: 0.50    Years: 10.00    Pack years: 5.00    Types: Cigarettes    Quit date: 2006    Years since quitting: 16.5  . Smokeless tobacco: Never  Substance Use Topics  . Alcohol use: Not Currently    FAMILY HISTORY:   Family History  Problem Relation Age of Onset  . Healthy Son   . Healthy Son     DRUG ALLERGIES:  No Known Allergies  REVIEW OF SYSTEMS:   ROS As per history of present illness. All pertinent systems were reviewed above. Constitutional, HEENT, cardiovascular, respiratory, GI, GU, musculoskeletal, neuro, psychiatric, endocrine, integumentary and hematologic systems were reviewed and are otherwise negative/unremarkable except for positive findings mentioned  above in the HPI.   MEDICATIONS AT HOME:   Prior to Admission medications   Medication Sig Start Date End Date Taking? Authorizing Provider  acetaminophen (TYLENOL) 325 MG tablet Take 650 mg by mouth every 4 (four) hours as needed.    [provider]  Albuterol Sulfate (PROAIR RESPICLICK) 108 (90 Base) MCG/ACT AEPB Inhale into the lungs.    [provider]  ANTI-DANDRUFF 1 % SHAM Apply topically. 02/18/20   [provider]  apixaban (ELIQUIS) 5 MG TABS tablet Take 5 mg by mouth 2 (two) times daily.  02/06/17   [provider]  ascorbic acid (VITAMIN C) 500 MG tablet Take by mouth.     [provider]  benzonatate (TESSALON) 100 MG capsule Take by mouth 2 (two) times daily as needed for cough.    [provider]  bisacodyl (DULCOLAX) 10 MG suppository Place 10 mg rectally as needed for moderate constipation.    [provider]  Budeson-Glycopyrrol-Formoterol (BREZTRI AEROSPHERE) 160-9-4.8 MCG/ACT AERO Inhale 2 puffs into the lungs in the morning and at bedtime.    [provider]  calcium carbonate (TUMS - DOSED IN MG ELEMENTAL CALCIUM) 500 MG chewable tablet Chew 2 tablets by mouth every 6 (six) hours as needed for indigestion or heartburn.    [provider]  carbidopa-levodopa (SINEMET IR) 25-100 MG tablet Take 1 tablet by mouth 3 (three) times daily. Start Carbidopa Levodopa as follows: Take 1/2 tablet three times daily, at least 30 minutes before meals (approximately 7am/11am/4pm), for one week Then take 1/2 tablet in the morning, 1/2 tablet in the afternoon, 1 tablet in the evening, at least 30 minutes before meals, for one week Then take 1/2 tablet in the morning, 1 tablet in the afternoon, 1 tablet in the evening, at least 30 minutes before meals, for one week Then take 1 tablet three times daily at 7am/11am/4pm, at least 30 minutes before meals 12/07/19   Tat, Octaviano Battyebecca S, DO  carboxymethylcellulose (REFRESH  PLUS) 0.5 % SOLN 1 drop as needed.    [provider]  cetirizine (ZYRTEC) 10 MG tablet Take 10 mg by mouth daily.     [provider]  clobetasol cream (TEMOVATE) 0.05 % Apply topically. 01/12/20   [provider]  diphenhydrAMINE-zinc acetate (BENADRYL ITCH STOPPING) cream Apply topically every 4 (four) hours.    [provider]  Docusate Sodium (STOOL SOFTENER LAXATIVE PO) Take 1 tablet by mouth in the morning and at bedtime.    [provider]  Emollient (CETAPHIL) cream Apply 1 application topically as needed.    [provider]  Emollient (EUCERIN DAILY PROTECTION/SPF30) LOTN Apply topically as needed.    [provider]  escitalopram (LEXAPRO) 5 MG tablet Take 5 mg by  mouth daily.    [provider]  finasteride (PROSCAR) 5 MG tablet Take 5 mg by mouth daily.    [provider]  fluticasone (FLONASE) 50 MCG/ACT nasal spray Place 1 spray into both nostrils daily.    [provider]  furosemide (LASIX) 20 MG tablet Take by mouth. 01/27/20   [provider]  gabapentin (NEURONTIN) 800 MG tablet Take 800 mg by mouth 2 (two) times daily.     [provider]  guaiFENesin (MUCINEX) 600 MG 12 hr tablet Take 600 mg by mouth every 12 (twelve) hours.    [provider]  hydrocortisone (ANUSOL-HC) 2.5 % rectal cream Place 1 application rectally 2 (two) times daily. 09/15/19   Toney Reil, MD  hydrocortisone cream 0.5 % Apply topically.    [provider]  hydrOXYzine (ATARAX/VISTARIL) 25 MG tablet Take by mouth. 02/17/20   [provider]  ibuprofen (ADVIL) 200 MG tablet Take 200 mg by mouth 2 (two) times daily as needed (jaw pain).    [provider]  ipratropium (ATROVENT) 0.03 % nasal spray Place into both nostrils. 02/18/20   [provider]  ipratropium (ATROVENT) 0.06 % nasal spray Place into the nose.    [provider]  lactase  (LACTAID) 3000 units tablet Take 3,000 Units by mouth 3 (three) times daily with meals.    [provider]  Lidocaine 3 % CREA Apply topically daily as needed.    [provider]  lisinopril (ZESTRIL) 2.5 MG tablet Take 2.5 mg by mouth daily.    [provider]  loperamide (IMODIUM) 2 MG capsule Take 4 mg by mouth as needed for diarrhea or loose stools.    [provider]  metFORMIN (GLUCOPHAGE-XR) 500 MG 24 hr tablet Take 500 mg by mouth daily.     [provider]  methocarbamol (ROBAXIN) 500 MG tablet Take 1,000 mg by mouth in the morning and at bedtime.     [provider]  metoprolol succinate (TOPROL-XL) 50 MG 24 hr tablet Take 50 mg by mouth daily.     [provider]  montelukast (SINGULAIR) 10 MG tablet Take 10 mg by mouth at bedtime.    [provider]  nystatin (MYCOSTATIN/NYSTOP) powder  10/21/18   [provider]  psyllium (METAMUCIL) 58.6 % powder Take 1 packet by mouth daily.    [provider]  SARNA lotion Apply topically. 02/25/20   [provider]  senna-docusate (SENOKOT-S) 8.6-50 MG tablet Take by mouth. 10/21/18   [provider]  Skin Protectants, Misc. (MINERIN CREME EX) Apply topically. Apply to feet twice bid    [provider]  tamsulosin (FLOMAX) 0.4 MG CAPS capsule Take 0.4 mg by mouth.    [provider]  torsemide (DEMADEX) 10 MG tablet Take 10 mg by mouth daily.    [provider]  traMADol (ULTRAM) 50 MG tablet Take 100 mg by mouth in the morning.     [provider]  traZODone (DESYREL) 100 MG tablet Take 100 mg by mouth at bedtime.    [provider]  TRELEGY ELLIPTA 100-62.5-25 MCG/INH AEPB Inhale 1 puff into the lungs daily. 02/17/20   [provider]  triamcinolone cream (KENALOG) 0.1 % Apply 1 application topically 2 (two) times daily.    [provider]      VITAL SIGNS:  Blood pressure  (!) 105/54, pulse 73, temperature 98 F (36.7 C), temperature source Oral, resp. rate 19, weight  103 kg, SpO2 95 %.  PHYSICAL EXAMINATION:  Physical Exam  GENERAL:  75 y.o.-year-old Caucasian male patient lying in the bed with mild respiratory distress with conversational dyspnea EYES: Pupils equal, round, reactive to light and accommodation. No scleral icterus. Extraocular muscles intact.  HEENT: Head atraumatic, normocephalic. Oropharynx and nasopharynx clear.  NECK:  Supple, no jugular venous distention. No thyroid enlargement, no tenderness.  LUNGS: Diffuse expiratory wheezes with diminished expiratory airflow and heart vesicular breathing with mildly coarse breath sounds. CARDIOVASCULAR: Regular rate and rhythm, S1, S2 normal. No murmurs, rubs, or gallops.  ABDOMEN: Soft, nondistended, nontender. Bowel sounds present. No organomegaly or mass.  EXTREMITIES: 1-2+ pedal and ankle pitting edema with trace leg edema with no cyanosis, or clubbing.  NEUROLOGIC: Cranial nerves II through XII are intact. Muscle strength 5/5 in all extremities. Sensation intact. Gait not checked.  PSYCHIATRIC: The patient is alert and oriented x 3.  Normal affect and good eye contact. SKIN: No obvious rash, lesion, or ulcer.   LABORATORY PANEL:   CBC Recent Labs  Lab 10/16/20 1536  WBC 18.0*  HGB 11.4*  HCT 35.0*  PLT 196   ------------------------------------------------------------------------------------------------------------------  Chemistries  Recent Labs  Lab 10/16/20 1536  NA 137  K 4.7  CL 102  CO2 24  GLUCOSE 153*  BUN 38*  CREATININE 1.61*  CALCIUM 8.8*  MG 1.9  AST 14*  ALT 6  ALKPHOS 72  BILITOT 1.2   ------------------------------------------------------------------------------------------------------------------  Cardiac Enzymes No results for input(s): TROPONINI in the last 168  hours. ------------------------------------------------------------------------------------------------------------------  RADIOLOGY:  CT Head Wo Contrast  Result Date: 10/16/2020 CLINICAL DATA:  Weakness for 4 days. Altered mental status. Disoriented EXAM: CT HEAD WITHOUT CONTRAST TECHNIQUE: Contiguous axial images were obtained from the base of the skull through the vertex without intravenous contrast. COMPARISON:  None. FINDINGS: Brain: Age related atrophy. No intracranial hemorrhage, mass effect, or midline shift. No hydrocephalus. The basilar cisterns are patent. Moderate periventricular and deep white matter hypodensity consistent with chronic small vessel ischemia. No evidence of territorial infarct or acute ischemia. No extra-axial or intracranial fluid collection. Vascular: Atherosclerosis of skullbase vasculature without hyperdense vessel or abnormal calcification. Skull: No fracture or focal lesion. Sinuses/Orbits: Paranasal sinuses and mastoid air cells are clear. The visualized orbits are unremarkable. Other: There is prominent degenerative pannus at C1-C2 which causes mild mass effect and narrowing of the cranial cervical junction, only partially included in the field of view. IMPRESSION: 1. No acute intracranial abnormality. 2. Age related atrophy and chronic small vessel ischemia. 3. Prominent degenerative pannus at C1-C2 which causes mild mass effect and narrowing of the cranial cervical junction, only partially included in the field of view. Electronically Signed   By: Narda Rutherford M.D.   On: 10/16/2020 18:22   CT Chest Wo Contrast  Result Date: 10/16/2020 CLINICAL DATA:  Cough and leukocytosis. EXAM: CT CHEST WITHOUT CONTRAST TECHNIQUE: Multidetector CT imaging of the chest was performed following the standard protocol without IV contrast. COMPARISON:  Radiograph earlier today.  Chest CTA 12/28/2019 FINDINGS: Cardiovascular: Aortic atherosclerosis and tortuosity. No aortic aneurysm.  No periaortic stranding. Mild dilatation of the central pulmonary artery at 3.6 cm. Cardiomegaly with advanced coronary artery calcifications. Presumed planted metallic densities in the left ventricle. No significant pericardial effusion. Small amount of fluid in the superior pericardial recess. Mediastinum/Nodes: No enlarged mediastinal lymph nodes. Limited assessment for hilar adenopathy in this unenhanced exam. Tiny hiatal hernia. No suspicious thyroid nodule. Lungs/Pleura: No consolidation to suggest pneumonia. Dependent atelectasis  in the right greater than left lower lobe. No septal thickening or pulmonary edema. No pleural fluid. No mass or suspicious nodule. Trachea and central bronchi are patent. Upper Abdomen: Low-density lesion in the right lobe of the liver is similar to prior exam, incompletely characterized on this unenhanced exam. Gallstone noted without pericholecystic fat stranding. No acute findings in the upper abdomen. Musculoskeletal: Diffuse thoracic spondylosis and degenerative disc disease. Advanced degenerative change of both shoulders. Similar sclerotic appearance of the posterior right fifth rib, stable in appearance similar but less pronounced appearance of the left eighth and eleventh ribs. No new osseous findings. No chest wall soft tissue abnormality. IMPRESSION: 1. No evidence of pneumonia. Dependent atelectasis in the right greater than left lower lobe. 2. Cardiomegaly with advanced coronary artery calcifications. 3. Mild dilatation of the central pulmonary artery, can be seen with pulmonary arterial hypertension. 4. Cholelithiasis incidentally noted in the upper abdomen. 5. Sclerotic cortical thickening of bilateral ribs is unchanged from September of 2021 exam. Findings may be related to Paget's disease, prior injury, or sclerotic metastasis. Overall stability over 10 months. Aortic Atherosclerosis (ICD10-I70.0). Electronically Signed   By: Narda Rutherford M.D.   On: 10/16/2020  18:29   DG Chest Portable 1 View  Result Date: 10/16/2020 CLINICAL DATA:  Progressive weakness EXAM: PORTABLE CHEST 1 VIEW COMPARISON:  12/16/2019 FINDINGS: Low lung volumes with central bronchovascular crowding. Mild cardiomegaly with probable slight central congestion. Aortic atherosclerosis. No pneumothorax. IMPRESSION: Low lung volumes.  Mild cardiomegaly with slight central congestion Electronically Signed   By: Jasmine Pang M.D.   On: 10/16/2020 16:10      IMPRESSION AND PLAN:  Active Problems:   Sepsis (HCC) 1.  Sepsis like secondary to lower respiratory infection/acute bronchitis and possible clinical pneumonia.  Sepsis is manifested with tachypnea and leukocytosis.  The patient may need severe sepsis criteria given associated hypotension and AKI.  Chest CT showed no evidence of infiltrates though. - The patient was admitted to a medical monitored bed. - We will continue to bite therapy with IV Rocephin and Zithromax. - Will follow blood and sputum culture. - We will place the patient on as needed and scheduled DuoNebs. - We will add mucolytic therapy. -O2 protocol will be followed.  2.  Paroxysmal atrial fibrillation.  The patient is currently in normal sinus rhythm. - We will continue Eliquis.  3.  Mild COPD exacerbation - We will place the patient on scheduled and as needed DuoNebs and continue Singulair. - We will hold off steroid therapy given current leukocytosis.  4.  Acute kidney injury. - The patient will be hydrated with IV normal saline. - We will follow BMPs. - We will hold off diuretics and ACE inhibitor therapy as well as metformin.  5.  Right knee pain. - He will be placed on as needed Tylenol and IV morphine sulfate for pain.  6.  Type 2 diabetes mellitus with peripheral neuropathy. - We will place the patient on supplement coverage with NovoLog. - We will continue Neurontin.  Have not set.  Gait difficulty apparently thought to be related to  parkinsonism. - We will continue his Sinemet.  7.  Essential hypertension. - We will continue Toprol-XL and hold off lisinopril.  DVT prophylaxis: Eliquis will be resumed.  Code Status: full code. Family Communication:  The plan of care was discussed in details with the patient (and family). I answered all questions. The patient agreed to proceed with the above mentioned plan. Further management will depend upon  hospital course. Disposition Plan: Back to previous home environment Consults called: none. All the records are reviewed and case discussed with ED provider.  Status is: Inpatient  Not inpatient appropriate, will call UM team and downgrade to OBS.   Dispo: The patient is from: Home              Anticipated d/c is to: Home              Patient currently is not medically stable to d/c.   Difficult to place patient No  TOTAL TIME TAKING CARE OF THIS PATIENT: 55 minutes.    Hannah Beat M.D on 10/16/2020 at 8:06 PM  [facility]   From 7 PM-7 AM, contact night-coverage [email or web address]  CC: Primary care physician; [facility]

## 2020-10-16 NOTE — ED Notes (Signed)
Pt sleeping. 

## 2020-10-16 NOTE — ED Notes (Signed)
Patient catheter bag and tubing replaced. Preexisting suprapubic catheter remains in place.

## 2020-10-16 NOTE — ED Triage Notes (Signed)
Pt brought in via ems from [facility].  Pt has weakness for 4 days.  Pt also reports exertional dyspnea.  No chest pain.  Hx afib.  Pt on 2 liters oxygen.  Foley in place  pt alert  md at bedside.

## 2020-10-16 NOTE — ED Provider Notes (Signed)
[facility] Emergency Department Provider Note ____________________________________________   Event Date/Time   First MD Initiated Contact with Patient 10/16/20 1532     (approximate)  I have reviewed the triage vital signs and the nursing notes.  HISTORY  Chief Complaint Weakness   HPI Raymond Hays is a 75 y.o. malewho presents to the ED for evaluation of generalized weakness.   Chart review indicates hx pertinent A. 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  Patient presents to the ED today from his SNF via EMS for evaluation of generalized weakness for the past 4 days.  He reports he is feeling weak and tired and generalized fashion.  He reports poor p.o. intake and associated decreased output.  He reports increased sputum/phlegm production with his coughing, but denies feeling short of breath.  Denies chest pain, subjective fevers or chills.  He denies abdominal pain, emesis or diarrhea.   Past Medical History:  Diagnosis Date   Allergic rhinitis    Anxiety    Arthritis    Chronic indwelling Foley catheter    COPD (chronic obstructive pulmonary disease) (HCC)    Cough    Diabetes (HCC)    Essential (primary) hypertension    Hemorrhoid    Hyperlipidemia    PAF (paroxysmal atrial fibrillation) (HCC)    Primary osteoarthritis, unspecified shoulder    Retention of urine, unspecified    Spinal stenosis    UTI (urinary tract infection)     Patient Active Problem List   Diagnosis Date Noted   Chronic respiratory failure with hypoxia (HCC) 01/10/2020   Impaired functional mobility, balance, gait, and endurance 09/15/2019   Catheter cystitis (HCC) 10/16/2018   COVID-19 virus detected 07/03/2018   Benign prostatic hyperplasia with lower urinary tract symptoms 08/06/2017   Osteoarthritis of right knee 06/19/2017    S/P orthopedic surgery, follow-up exam 10/25/2016   Tendon rupture of wrist, sequela 01/02/2016   Chronic pain of right knee 08/03/2015   Calcific tendinitis of left shoulder 07/05/2015   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/05/2015   Gait difficulty 06/26/2015   Severe mitral regurgitation 06/05/2015   Atrial fibrillation (HCC) 05/31/2015   Chronic pain 05/31/2015   COPD (chronic obstructive pulmonary disease) (HCC) 05/31/2015   Essential hypertension 05/31/2015   Chronic pain of left wrist 04/24/2015   Arthritis of left wrist 04/24/2015   Rupture of extensor tendon of left hand 04/24/2015    Past Surgical History:  Procedure Laterality Date   APPENDECTOMY     BACK SURGERY     ELBOW SURGERY     HERNIA REPAIR     KNEE SURGERY     neck     PARTIAL HIP ARTHROPLASTY Bilateral    TONSILLECTOMY      Prior to Admission medications   Medication Sig Start Date End Date Taking? Authorizing Provider  acetaminophen (TYLENOL) 325 MG tablet Take 650 mg by mouth every 4 (four) hours as needed.    [provider]  Albuterol Sulfate (PROAIR RESPICLICK) 108 (90 Base) MCG/ACT AEPB Inhale into the lungs.    [provider]  ANTI-DANDRUFF 1 % SHAM Apply topically. 02/18/20   [provider]  apixaban (ELIQUIS) 5 MG TABS tablet Take 5 mg by mouth 2 (two) times daily.  02/06/17   [provider]  ascorbic acid (VITAMIN C) 500 MG tablet Take by mouth.  [provider]  benzonatate (TESSALON) 100 MG capsule Take by mouth 2 (two) times daily as needed for cough.    [provider]  bisacodyl (DULCOLAX) 10 MG suppository Place 10 mg rectally as needed for moderate constipation.    [provider]  Budeson-Glycopyrrol-Formoterol (BREZTRI AEROSPHERE) 160-9-4.8 MCG/ACT AERO Inhale 2 puffs into the lungs in the morning and at bedtime.    [provider]  calcium carbonate (TUMS - DOSED IN MG ELEMENTAL CALCIUM) 500 MG chewable tablet  Chew 2 tablets by mouth every 6 (six) hours as needed for indigestion or heartburn.    [provider]  carbidopa-levodopa (SINEMET IR) 25-100 MG tablet Take 1 tablet by mouth 3 (three) times daily. Start Carbidopa Levodopa as follows: Take 1/2 tablet three times daily, at least 30 minutes before meals (approximately 7am/11am/4pm), for one week Then take 1/2 tablet in the morning, 1/2 tablet in the afternoon, 1 tablet in the evening, at least 30 minutes before meals, for one week Then take 1/2 tablet in the morning, 1 tablet in the afternoon, 1 tablet in the evening, at least 30 minutes before meals, for one week Then take 1 tablet three times daily at 7am/11am/4pm, at least 30 minutes before meals 12/07/19   Tat, Octaviano Battyebecca S, DO  carboxymethylcellulose (REFRESH PLUS) 0.5 % SOLN 1 drop as needed.    [provider]  cetirizine (ZYRTEC) 10 MG tablet Take 10 mg by mouth daily.     [provider]  clobetasol cream (TEMOVATE) 0.05 % Apply topically. 01/12/20   [provider]  diphenhydrAMINE-zinc acetate (BENADRYL ITCH STOPPING) cream Apply topically every 4 (four) hours.    [provider]  Docusate Sodium (STOOL SOFTENER LAXATIVE PO) Take 1 tablet by mouth in the morning and at bedtime.    [provider]  Emollient (CETAPHIL) cream Apply 1 application topically as needed.    [provider]  Emollient (EUCERIN DAILY PROTECTION/SPF30) LOTN Apply topically as needed.    [provider]  escitalopram (LEXAPRO) 5 MG tablet Take 5 mg by mouth daily.    [provider]  finasteride (PROSCAR) 5 MG tablet Take 5 mg by mouth daily.    [provider]  fluticasone (FLONASE) 50 MCG/ACT nasal spray Place 1 spray into both nostrils daily.    [provider]  furosemide (LASIX) 20 MG tablet Take by mouth. 01/27/20   [provider]  gabapentin (NEURONTIN) 800 MG tablet Take 800 mg by mouth 2 (two) times daily.      [provider]  guaiFENesin (MUCINEX) 600 MG 12 hr tablet Take 600 mg by mouth every 12 (twelve) hours.    [provider]  hydrocortisone (ANUSOL-HC) 2.5 % rectal cream Place 1 application rectally 2 (two) times daily. 09/15/19   Toney ReilVanga, Rohini Reddy, MD  hydrocortisone cream 0.5 % Apply topically.    [provider]  hydrOXYzine (ATARAX/VISTARIL) 25 MG tablet Take by mouth. 02/17/20   [provider]  ibuprofen (ADVIL) 200 MG tablet Take 200 mg by mouth 2 (two) times daily as needed (jaw pain).    [provider]  ipratropium (ATROVENT) 0.03 % nasal spray Place into both nostrils. 02/18/20   [provider]  ipratropium (ATROVENT) 0.06 % nasal spray Place into the nose.    [provider]  lactase (LACTAID) 3000 units tablet Take 3,000 Units by mouth 3 (three) times daily with meals.    [provider]  Lidocaine 3 % CREA Apply  topically daily as needed.    [provider]  lisinopril (ZESTRIL) 2.5 MG tablet Take 2.5 mg by mouth daily.    [provider]  loperamide (IMODIUM) 2 MG capsule Take 4 mg by mouth as needed for diarrhea or loose stools.    [provider]  metFORMIN (GLUCOPHAGE-XR) 500 MG 24 hr tablet Take 500 mg by mouth daily.     [provider]  methocarbamol (ROBAXIN) 500 MG tablet Take 1,000 mg by mouth in the morning and at bedtime.     [provider]  metoprolol succinate (TOPROL-XL) 50 MG 24 hr tablet Take 50 mg by mouth daily.     [provider]  montelukast (SINGULAIR) 10 MG tablet Take 10 mg by mouth at bedtime.    [provider]  nystatin (MYCOSTATIN/NYSTOP) powder  10/21/18   [provider]  psyllium (METAMUCIL) 58.6 % powder Take 1 packet by mouth daily.    [provider]  SARNA lotion Apply topically. 02/25/20   [provider]  senna-docusate (SENOKOT-S) 8.6-50 MG tablet Take by mouth. 10/21/18    [provider]  Skin Protectants, Misc. (MINERIN CREME EX) Apply topically. Apply to feet twice bid    [provider]  tamsulosin (FLOMAX) 0.4 MG CAPS capsule Take 0.4 mg by mouth.    [provider]  torsemide (DEMADEX) 10 MG tablet Take 10 mg by mouth daily.    [provider]  traMADol (ULTRAM) 50 MG tablet Take 100 mg by mouth in the morning.     [provider]  traZODone (DESYREL) 100 MG tablet Take 100 mg by mouth at bedtime.    [provider]  TRELEGY ELLIPTA 100-62.5-25 MCG/INH AEPB Inhale 1 puff into the lungs daily. 02/17/20   [provider]  triamcinolone cream (KENALOG) 0.1 % Apply 1 application topically 2 (two) times daily.    [provider]    Allergies Patient has no known allergies.  Family History  Problem Relation Age of Onset   Healthy Son    Healthy Son     Social History Social History   Tobacco Use   Smoking status: Former    Packs/day: 0.50    Years: 10.00    Pack years: 5.00    Types: Cigarettes    Quit date: 2006    Years since quitting: 16.5   Smokeless tobacco: Never  Vaping Use   Vaping Use: Never used  Substance Use Topics   Alcohol use: Not Currently   Drug use: Not Currently    Types: "Crack" cocaine    Comment: quit "crack" 15 years ago--11/04/2019    Review of Systems  Constitutional: No fever/chills Eyes: No visual changes. ENT: No sore throat. Cardiovascular: Denies chest pain. Respiratory: Denies shortness of breath.  Positive for cough and increased sputum production. Gastrointestinal: No abdominal pain.  No nausea, no vomiting.  No diarrhea.  No constipation. Genitourinary: Negative for dysuria. Musculoskeletal: Negative for back pain. Skin: Negative for rash. Neurological: Negative for headaches, focal weakness or numbness.  ____________________________________________   PHYSICAL EXAM:  VITAL SIGNS: Vitals:   10/16/20 1855 10/16/20 1856  BP:     Pulse:  73  Resp: 18 19  Temp:    SpO2:  95%     Constitutional: Alert and oriented.  Appears uncomfortable but in no acute distress.  Conversational. Eyes: Conjunctivae are normal. PERRL. EOMI. Head: Atraumatic. Nose: No congestion/rhinnorhea. Mouth/Throat: Mucous membranes are moist.  Oropharynx non-erythematous. Neck: No stridor.  No cervical spine tenderness to palpation. Cardiovascular: Normal rate, regular rhythm. Grossly normal heart sounds.  Good peripheral circulation. Respiratory: Some mild tachypnea to the low 20s.  No retractions.  Coarse bibasilar breath sounds without wheezing noted. Gastrointestinal: Soft , nondistended, nontender to palpation. No CVA tenderness.. Indwelling suprapubic catheter with clean insertion site without erythema, purulence, induration or tenderness. Musculoskeletal: No lower extremity tenderness .  No joint effusions. No signs of acute trauma. Neurologic:  Normal speech and language. No gross focal neurologic deficits are appreciated.  Skin:  Skin is warm, dry and intact. No rash noted. Psychiatric: Mood and affect are normal. Speech and behavior are normal.  ____________________________________________   LABS (all labs ordered are listed, but only abnormal results are displayed)  Labs Reviewed  COMPREHENSIVE METABOLIC PANEL - Abnormal; Notable for the following components:      Result Value   Glucose, Bld 153 (*)    BUN 38 (*)    Creatinine, Ser 1.61 (*)    Calcium 8.8 (*)    Albumin 3.2 (*)    AST 14 (*)    GFR, Estimated 44 (*)    All other components within normal limits  CBC WITH DIFFERENTIAL/PLATELET - Abnormal; Notable for the following components:   WBC 18.0 (*)    RBC 4.03 (*)    Hemoglobin 11.4 (*)    HCT 35.0 (*)    RDW 15.9 (*)    Neutro Abs 16.3 (*)    Lymphs Abs 0.6 (*)    Abs Immature Granulocytes 0.25 (*)    All other components within normal limits  URINALYSIS, COMPLETE (UACMP) WITH MICROSCOPIC - Abnormal;  Notable for the following components:   Color, Urine YELLOW (*)    APPearance HAZY (*)    Hgb urine dipstick MODERATE (*)    All other components within normal limits  RESP PANEL BY RT-PCR (FLU A&B, COVID) ARPGX2  CULTURE, BLOOD (ROUTINE X 2)  CULTURE, BLOOD (ROUTINE X 2)  MAGNESIUM  LACTIC ACID, PLASMA  PROCALCITONIN  LACTIC ACID, PLASMA  PROCALCITONIN   ____________________________________________  12 Lead EKG  Atrial fibrillation, rate 84 bpm.  Normal axis and intervals.  No STEMI. ____________________________________________  RADIOLOGY  ED MD interpretation: 1 view CXR with cardiomegaly and poor aeration without obvious infiltration.  Official radiology report(s): CT Head Wo Contrast  Result Date: 10/16/2020 CLINICAL DATA:  Weakness for 4 days. Altered mental status. Disoriented EXAM: CT HEAD WITHOUT CONTRAST TECHNIQUE: Contiguous axial images were obtained from the base of the skull through the vertex without intravenous contrast. COMPARISON:  None. FINDINGS: Brain: Age related atrophy. No intracranial hemorrhage, mass effect, or midline shift. No hydrocephalus. The basilar cisterns are patent. Moderate periventricular and deep white matter hypodensity consistent with chronic small vessel ischemia. No evidence of territorial infarct or acute ischemia. No extra-axial or intracranial fluid collection. Vascular: Atherosclerosis of skullbase vasculature without hyperdense vessel or abnormal calcification. Skull: No fracture or focal lesion. Sinuses/Orbits: Paranasal sinuses and mastoid air cells are clear. The visualized orbits are unremarkable. Other: There is prominent degenerative pannus at C1-C2 which causes mild mass effect and narrowing of the cranial cervical junction, only partially included in the field of view. IMPRESSION: 1. No acute intracranial abnormality. 2. Age related atrophy and chronic small vessel ischemia. 3. Prominent degenerative pannus at C1-C2 which causes mild  mass effect and narrowing of the cranial cervical junction, only partially included in the field of view. Electronically Signed   By: Narda Rutherford M.D.   On: 10/16/2020  18:22   CT Chest Wo Contrast  Result Date: 10/16/2020 CLINICAL DATA:  Cough and leukocytosis. EXAM: CT CHEST WITHOUT CONTRAST TECHNIQUE: Multidetector CT imaging of the chest was performed following the standard protocol without IV contrast. COMPARISON:  Radiograph earlier today.  Chest CTA 12/28/2019 FINDINGS: Cardiovascular: Aortic atherosclerosis and tortuosity. No aortic aneurysm. No periaortic stranding. Mild dilatation of the central pulmonary artery at 3.6 cm. Cardiomegaly with advanced coronary artery calcifications. Presumed planted metallic densities in the left ventricle. No significant pericardial effusion. Small amount of fluid in the superior pericardial recess. Mediastinum/Nodes: No enlarged mediastinal lymph nodes. Limited assessment for hilar adenopathy in this unenhanced exam. Tiny hiatal hernia. No suspicious thyroid nodule. Lungs/Pleura: No consolidation to suggest pneumonia. Dependent atelectasis in the right greater than left lower lobe. No septal thickening or pulmonary edema. No pleural fluid. No mass or suspicious nodule. Trachea and central bronchi are patent. Upper Abdomen: Low-density lesion in the right lobe of the liver is similar to prior exam, incompletely characterized on this unenhanced exam. Gallstone noted without pericholecystic fat stranding. No acute findings in the upper abdomen. Musculoskeletal: Diffuse thoracic spondylosis and degenerative disc disease. Advanced degenerative change of both shoulders. Similar sclerotic appearance of the posterior right fifth rib, stable in appearance similar but less pronounced appearance of the left eighth and eleventh ribs. No new osseous findings. No chest wall soft tissue abnormality. IMPRESSION: 1. No evidence of pneumonia. Dependent atelectasis in the right  greater than left lower lobe. 2. Cardiomegaly with advanced coronary artery calcifications. 3. Mild dilatation of the central pulmonary artery, can be seen with pulmonary arterial hypertension. 4. Cholelithiasis incidentally noted in the upper abdomen. 5. Sclerotic cortical thickening of bilateral ribs is unchanged from September of 2021 exam. Findings may be related to Paget's disease, prior injury, or sclerotic metastasis. Overall stability over 10 months. Aortic Atherosclerosis (ICD10-I70.0). Electronically Signed   By: Narda Rutherford M.D.   On: 10/16/2020 18:29   DG Chest Portable 1 View  Result Date: 10/16/2020 CLINICAL DATA:  Progressive weakness EXAM: PORTABLE CHEST 1 VIEW COMPARISON:  12/16/2019 FINDINGS: Low lung volumes with central bronchovascular crowding. Mild cardiomegaly with probable slight central congestion. Aortic atherosclerosis. No pneumothorax. IMPRESSION: Low lung volumes.  Mild cardiomegaly with slight central congestion Electronically Signed   By: Jasmine Pang M.D.   On: 10/16/2020 16:10    ____________________________________________   PROCEDURES and INTERVENTIONS  Procedure(s) performed (including Critical Care):  .1-3 Lead EKG Interpretation  Date/Time: 10/16/2020 5:04 PM Performed by: Delton Prairie, MD Authorized by: Delton Prairie, MD     Interpretation: normal     ECG rate:  80   ECG rate assessment: normal     Rhythm: sinus rhythm     Ectopy: none     Conduction: normal    Medications  cefTRIAXone (ROCEPHIN) 1 g in sodium chloride 0.9 % 100 mL IVPB (has no administration in time range)  azithromycin (ZITHROMAX) 500 mg in sodium chloride 0.9 % 250 mL IVPB (has no administration in time range)  sodium chloride 0.9 % bolus 1,000 mL (0 mLs Intravenous Stopped 10/16/20 1716)  acetaminophen (TYLENOL) tablet 1,000 mg (1,000 mg Oral Given 10/16/20 1555)    ____________________________________________   MDM / ED COURSE   75 year old male presents to the ED  from his local SNF for evaluation of a few days of generalized weakness and cough, pending sepsis criteria due to possibly bronchitis versus CAP, requiring medical admission.  Tachypneic on arrival, otherwise normal vitals.  Blood work with leukocytosis,  in conjunction with his tachypnea, does meet sepsis criteria.  He has no lactic acidosis or signs of severe sepsis.  CKD around baseline.  Urine without infectious features.  X-ray demonstrates no features, and so follow-up CT obtained and similarly does not show pulmonary infiltration.  His procalcitonin is quite elevated and his overall clinical picture is concerning for acute infectious etiology of his symptoms, due to his respiratory symptoms, we provided Rocephin and azithromycin for CAP coverage and we will discussed the case with hospitalist for admission.   Clinical Course as of 10/16/20 Sanjuana Kava Oct 16, 2020  1720 Reassessed.  Clinically similar [DS]  1928 Reassessed. [DS]  1934 I discuss with Dr. Arville Care, who agrees to admit [DS]    Clinical Course User Index [DS] Delton Prairie, MD    ____________________________________________   FINAL CLINICAL IMPRESSION(S) / ED DIAGNOSES  Final diagnoses:  Generalized weakness     ED Discharge Orders     None        Christalyn Goertz Katrinka Blazing   Note:  This document was prepared using Dragon voice recognition software and may include unintentional dictation errors.    Delton Prairie, MD 10/16/20 [phone]

## 2020-10-17 ENCOUNTER — Inpatient Hospital Stay: Payer: No Typology Code available for payment source

## 2020-10-17 DIAGNOSIS — N179 Acute kidney failure, unspecified: Secondary | ICD-10-CM | POA: Diagnosis not present

## 2020-10-17 DIAGNOSIS — R6520 Severe sepsis without septic shock: Secondary | ICD-10-CM

## 2020-10-17 DIAGNOSIS — A419 Sepsis, unspecified organism: Secondary | ICD-10-CM | POA: Diagnosis not present

## 2020-10-17 LAB — BLOOD CULTURE ID PANEL (REFLEXED) - BCID2

## 2020-10-17 LAB — COMPREHENSIVE METABOLIC PANEL
ALT: 10 U/L (ref 0–44)
AST: 9 U/L — ABNORMAL LOW (ref 15–41)
Albumin: 2.7 g/dL — ABNORMAL LOW (ref 3.5–5.0)
Alkaline Phosphatase: 57 U/L (ref 38–126)
Anion gap: 10 (ref 5–15)
BUN: 36 mg/dL — ABNORMAL HIGH (ref 8–23)
CO2: 23 mmol/L (ref 22–32)
Calcium: 8.2 mg/dL — ABNORMAL LOW (ref 8.9–10.3)
Chloride: 104 mmol/L (ref 98–111)
Creatinine, Ser: 1.23 mg/dL (ref 0.61–1.24)
GFR, Estimated: >60 mL/min (ref >60)
Glucose, Bld: 174 mg/dL — ABNORMAL HIGH (ref 70–99)
Potassium: 4.1 mmol/L (ref 3.5–5.1)
Sodium: 137 mmol/L (ref 135–145)
Total Bilirubin: 0.8 mg/dL (ref 0.3–1.2)
Total Protein: 6.3 g/dL — ABNORMAL LOW (ref 6.5–8.1)

## 2020-10-17 LAB — PROCALCITONIN: Procalcitonin: 4.16 ng/mL

## 2020-10-17 LAB — PROTIME-INR
INR: 2.3 — ABNORMAL HIGH (ref 0.8–1.2)
Prothrombin Time: 25.4 seconds — ABNORMAL HIGH (ref 11.4–15.2)

## 2020-10-17 LAB — CORTISOL-AM, BLOOD: Cortisol - AM: 28.8 ug/dL — ABNORMAL HIGH (ref 6.7–22.6)

## 2020-10-17 MED ORDER — METHYLPREDNISOLONE SODIUM SUCC 40 MG IJ SOLR
20.0000 mg | Freq: Two times a day (BID) | INTRAMUSCULAR | Status: DC
  Administered 2020-10-17 – 2020-10-18 (×3): 20 mg via INTRAVENOUS
  Filled 2020-10-17 (×3): qty 1

## 2020-10-17 MED ORDER — CHLORHEXIDINE GLUCONATE CLOTH 2 % EX PADS
6.0000 | MEDICATED_PAD | Freq: Every day | CUTANEOUS | Status: DC
  Administered 2020-10-17 – 2020-11-01 (×14): 6 via TOPICAL

## 2020-10-17 MED ORDER — KETOROLAC TROMETHAMINE 15 MG/ML IJ SOLN
15.0000 mg | Freq: Four times a day (QID) | INTRAMUSCULAR | Status: DC
  Administered 2020-10-17 – 2020-10-21 (×16): 15 mg via INTRAVENOUS
  Filled 2020-10-17 (×17): qty 1

## 2020-10-17 MED ORDER — TRAMADOL HCL 50 MG PO TABS
100.0000 mg | ORAL_TABLET | Freq: Three times a day (TID) | ORAL | Status: DC | PRN
Start: 2020-10-17 — End: 2020-11-01
  Administered 2020-10-17 – 2020-10-30 (×16): 100 mg via ORAL
  Filled 2020-10-17 (×16): qty 2

## 2020-10-17 MED ORDER — ENSURE ENLIVE PO LIQD
237.0000 mL | Freq: Two times a day (BID) | ORAL | Status: DC
  Administered 2020-10-18 – 2020-10-30 (×20): 237 mL via ORAL

## 2020-10-17 MED ORDER — IPRATROPIUM-ALBUTEROL 0.5-2.5 (3) MG/3ML IN SOLN
3.0000 mL | Freq: Three times a day (TID) | RESPIRATORY_TRACT | Status: DC
  Administered 2020-10-17 – 2020-10-30 (×37): 3 mL via RESPIRATORY_TRACT
  Filled 2020-10-17 (×37): qty 3

## 2020-10-17 NOTE — Progress Notes (Signed)
Initial Nutrition Assessment  DOCUMENTATION CODES:  Obesity unspecified  INTERVENTION:  Liberalize diet to regular for poor intake and advanced age Ensure Enlive po BID, each supplement provides 350 kcal and 20 grams of protein  NUTRITION DIAGNOSIS:  Inadequate oral intake related to poor appetite as evidenced by per patient/family report.  GOAL:  Patient will meet greater than or equal to 90% of their needs  MONITOR:  PO intake, Weight trends, Supplement acceptance  REASON FOR ASSESSMENT:  Consult Assessment of nutrition requirement/status  ASSESSMENT:  Pt presented [facility] ED from [facility] for weakness worsening x 4 days. PMH relevant for A. Fib, COPD, HTN, HLD, urinary retention with chronic suprapubic catheter, and DM.  Pt resting in bed at the time of visit. Lunch tray noted at bedside untouched. Inquired about appetite pt reports it's "not good." States it has been this way for several days. Thinks that weight has been stable, has not noticed a difference in the way his clothes fit him. Unsure of usual weight but states that he was weighed on admission.   Will recommend diet liberalization as pt reports poor intake and lunch tray was visualized poorly consumed. Will add nutrition supplements to augment intake.  Average Meal Intake: 7/18-7/19: 70% intake x 1 recorded meal  Nutritionally Relevant Medications: Scheduled Meds:  ascorbic acid  500 mg Oral Daily   escitalopram  5 mg Oral Daily   lactase  3,000 Units Oral TID WC   methylPREDNISolone (SOLU-MEDROL) injection  20 mg Intravenous Q12H   Continuous Infusions:  sodium chloride 100 mL/hr at 10/17/20 0716   cefTRIAXone (ROCEPHIN)  IV     PRN Meds: bisacodyl, calcium carbonate, loperamide, magnesium hydroxide, ondansetron, senna-docusate  Labs Reviewed: BUN 36 SBG ranges from 153-174 mg/dL over the last 24 hours  NUTRITION - FOCUSED PHYSICAL EXAM: Flowsheet Row Most Recent Value  Orbital Region No depletion   Upper Arm Region No depletion  Thoracic and Lumbar Region No depletion  Buccal Region No depletion  Temple Region No depletion  Clavicle Bone Region No depletion  Clavicle and Acromion Bone Region No depletion  Scapular Bone Region No depletion  Dorsal Hand No depletion  Patellar Region No depletion  Anterior Thigh Region No depletion  Posterior Calf Region No depletion  Edema (RD Assessment) Mild  Hair Reviewed  Eyes Reviewed  Mouth Reviewed  Skin Reviewed  Nails Reviewed   Diet Order:   Diet Order             Diet Heart Room service appropriate? Yes; Fluid consistency: Thin  Diet effective now                   EDUCATION NEEDS:  No education needs have been identified at this time  Skin:  Skin Assessment: Reviewed RN Assessment  Last BM:  7/18 - type 5  Height:  Ht Readings from Last 1 Encounters:  03/30/20 5\' 7"  (1.702 m)    Weight:  Wt Readings from Last 1 Encounters:  10/16/20 103 kg    Ideal Body Weight:  67.3 kg  BMI:  Body mass index is 35.55 kg/m.  Estimated Nutritional Needs:  Kcal:  1800-2000 kcal/d Protein:  90-100g/d Fluid:  1.8-2 L/d  10/18/20, RD, LDN Clinical Dietitian Pager on Amion

## 2020-10-17 NOTE — Evaluation (Signed)
Occupational Therapy Evaluation Patient Details Name: Raymond Hays MRN: [id number] DOB: Apr 13, 1945 Today's Date: 10/17/2020    History of Present Illness 75 y.o. male with medical history significant for type 2 diabetes mellitus, dyslipidemia, hypertension, COPD, and anxiety and allergic rhinitis, who presented to the ER with acute onset of generalized weakness over the last 4 days at her SNF.  He was having diminished appetite and decreased urine output. Pt admitted for sepsis, secondary to community-acquired pneumonia. Pt also c/o of neck pain, of unknown etilogy, as well as right knee pain.   Clinical Impression   Pt seen for OT/PT co-evaluation this date d/t complexity of the patient's impairments and to address functional/ADL transfers. Upon arrival to room, pt was sitting upright in bed, reporting 8/10 pain at neck, however agreeable to evaluation. Pt alert and oriented to self, place, and situation. Prior to admission, pt was residing at [facility], performing feeding and grooming tasks independently and performing transfers and functional mobility of short household distances (no more than 50 ft) with RW and MIN A/SUPERVISION. Pt reports requiring MAX A for toileting, bathing, and dressing at baseline.   Pt currently presents with severe pain (at neck, b/l shoulders, R elbow, and L knee), decreased balance, decreased strength, and decreased activity tolerance, and requires TOTAL A for LB dressing, SET-UP ASSIST for feeding in high folwer's position, and MAX-TOTAL Ax2 for supine<>sit transfers. Pt presents with poor static sitting balance at EOB, requiring bilateral UE support and intermittent MOD-MAX A, and is currently unable to engage in UB ADLs while sitting EOB. Pt would benefit from additional skilled OT services to maximize return to PLOF and minimize risk of future falls, injury, caregiver burden, and readmission. Upon discharge, recommend SNF.      Follow Up Recommendations  SNF     Equipment Recommendations  Other (comment) (defer to next venue of care)       Precautions / Restrictions Precautions Precautions: Fall Restrictions Weight Bearing Restrictions: No      Mobility Bed Mobility Overal bed mobility: Needs Assistance Bed Mobility: Supine to Sit;Sit to Supine     Supine to sit: Max assist;Total assist;+2 for physical assistance;HOB elevated Sit to supine: Max assist;Total assist;+2 for physical assistance   General bed mobility comments: Pt requiring MAX-TOTAL Ax2 to perform all bed mobility d/t pain at neck, R knee, and shoulders    Transfers                 General transfer comment: unable/unsafe to attempt this date    Balance Overall balance assessment: Needs assistance Sitting-balance support: Bilateral upper extremity supported;Feet supported Sitting balance-Leahy Scale: Poor Sitting balance - Comments: Intermittent MOD-MAX A to maintain static sitting balance at EOB                                   ADL either performed or assessed with clinical judgement   ADL Overall ADL's : Needs assistance/impaired Eating/Feeding: Set up;Supervision/ safety;Bed level Eating/Feeding Details (indicate cue type and reason): While in high fowler's position in bed, pt able to bring cup to mouth with increased time/effort, with pt attributing increased difficutly to neck, shoulder, and elbow pain. Pt reporting increased difficulty bringing food to mouth with utensils, recently spilling his food multiple times while feeding self with spoon.                 Lower Body Dressing: Total assistance;Bed level  Lower Body Dressing Details (indicate cue type and reason): to don/doff socks               General ADL Comments: Pt with poor static sitting balance at EOB, requiring bilateral UE support and ultimately unable to engage in UB ADLs while sitting EOB.      Pertinent Vitals/Pain Pain Assessment: 0-10 Pain Score: 8  Pain  Location: neck Pain Descriptors / Indicators: Aching;Discomfort Pain Intervention(s): Limited activity within patient's tolerance;Monitored during session;Premedicated before session;Repositioned        Extremity/Trunk Assessment Upper Extremity Assessment Upper Extremity Assessment: RUE deficits/detail;LUE deficits/detail RUE Deficits / Details: Pt complaining of significant pain during attempts to perform shoulder flexion and elbow flexion. RUE: Unable to fully assess due to pain RUE Sensation: WNL LUE Deficits / Details: Pt complaining of significant pain during attempts to perform shoulder flexion. Pt with baseline limited ROM of wrist d/t prior surgeries. Contraction of digit flexors, with pt stating that he typically only uses LUE as functional assist during ADLs LUE: Unable to fully assess due to pain LUE Coordination: decreased fine motor   Lower Extremity Assessment Lower Extremity Assessment: Generalized weakness;Defer to PT evaluation          Cognition Arousal/Alertness: Awake/alert Behavior During Therapy: WFL for tasks assessed/performed Overall Cognitive Status: No family/caregiver present to determine baseline cognitive functioning                                 General Comments: Alert and oriented to self, place, and situation. Pt agreeable to mobility following encouragement. Pt complaining of significant pain along multiple joints, however when asked about how long he has been experiencing pain, pt referring to surgeries that occurred years ago and this Thereasa Parkin was unable to obtain clear reason for recent increase in pain      Exercises Other Exercises Other Exercises: Education on role of OT, d/c recommendations, and POC, with pt verbalizing understanding        Home Living Family/patient expects to be discharged to:: Skilled nursing facility                                 Additional Comments: Pt resides at [facility]       Prior Functioning/Environment Level of Independence: Needs assistance  Gait / Transfers Assistance Needed: Pt reports at baseline being able to complete transfers to/from bed/WC (occasionally propelling WC himself) and ambulate limited distances (~50 ft) with RW and MIN A/SUPERVISION ADL's / Homemaking Assistance Needed: Pt reports being independent with feeding and grooming tasks. Pt reports requiring MAX A for toileting, bathing, and dressing.            OT Problem List: Decreased strength;Decreased range of motion;Decreased activity tolerance;Impaired balance (sitting and/or standing);Decreased coordination;Impaired UE functional use;Pain      OT Treatment/Interventions: Self-care/ADL training;Therapeutic exercise;Energy conservation;DME and/or AE instruction;Therapeutic activities;Patient/family education;Balance training    OT Goals(Current goals can be found in the care plan section) Acute Rehab OT Goals Patient Stated Goal: to return to PLOF OT Goal Formulation: With patient Time For Goal Achievement: 10/31/20 Potential to Achieve Goals: Fair ADL Goals Pt Will Perform Eating: with modified independence;sitting Pt Will Perform Grooming: with set-up;with supervision;sitting Additional ADL Goal #1: In preparation for bed-level toileting, pt will roll R&L side with MOD AX1  OT Frequency: Min 1X/week    AM-PAC OT "  6 Clicks" Daily Activity     Outcome Measure Help from another person eating meals?: A Little Help from another person taking care of personal grooming?: A Lot Help from another person toileting, which includes using toliet, bedpan, or urinal?: Total Help from another person bathing (including washing, rinsing, drying)?: A Lot Help from another person to put on and taking off regular upper body clothing?: A Lot Help from another person to put on and taking off regular lower body clothing?: Total 6 Click Score: 11   End of Session Equipment Utilized During Treatment:  Oxygen Nurse Communication: Mobility status  Activity Tolerance: Patient limited by pain Patient left: in bed;with call bell/phone within reach;with bed alarm set;Other (comment) (with respiratory therapist present)  OT Visit Diagnosis: Muscle weakness (generalized) (M62.81);Pain Pain - Right/Left: Left (R & L) Pain - part of body: Shoulder;Arm;Knee (neck)                Time: 4097-3532 OT Time Calculation (min): 41 min Charges:  OT General Charges $OT Visit: 1 Visit OT Evaluation $OT Eval Moderate Complexity: 1 Mod OT Treatments $Therapeutic Activity: 8-22 mins  Matthew Folks, OTR/L [facility] [phone]

## 2020-10-17 NOTE — TOC Progression Note (Signed)
Transition of Care [facility]) - Progression Note    Patient Details  Name: Raymond Hays MRN: [id number] Date of Birth: 1945/09/26  [facility] CM/SW Contact  Barrie Dunker, RN Phone Number: 10/17/2020, 8:59 AM  Clinical Narrative:    Went in to speak with the patient, He was leaving the room with Transport to go for test.  He comes form [facility], I called Tiffany at [facility] to confirm if STR or LTC, Awaiting a call back.  Will meet with the patient at a later time to determine needs        Expected Discharge Plan and Services                                                 Social Determinants of Health (SDOH) Interventions    Readmission Risk Interventions No flowsheet data found.

## 2020-10-17 NOTE — Progress Notes (Signed)
PT Cancellation Note  Patient Details Name: Raymond Hays MRN: [id number] DOB: January 08, 1946   Cancelled Treatment:    Reason Eval/Treat Not Completed: Pain limiting ability to participate;Fatigue/lethargy limiting ability to participate (Chart reviewed, RN consulted. Pt in severe neck pain at present, fairly anxious. Assisted with orange juice, then asks to be made flat 2/2 neck pain. RN aware.)  9:53 AM, 10/17/20 Rosamaria Lints, PT, DPT Physical Therapist - [facility]  [phone] (ASCOM)    Magan Winnett C 10/17/2020, 9:53 AM

## 2020-10-17 NOTE — Progress Notes (Signed)
PROGRESS NOTE    Raymond Hays  BVQ:[id number] DOB: Jul 17, 1945 DOA: 10/16/2020 PCP: [facility]   Brief Narrative:  75 y.o. Caucasian male with medical history significant for type 2 diabetes mellitus, dyslipidemia, hypertension, COPD, and anxiety and allergic rhinitis, who presented to the ER with acute onset of generalized weakness over the last 4 days at her SNF.  He has been having diminished appetite and decreased urine output.  He complains of increased cough with sputum production which he has been swallowing.  He was noted to have wheezing.  He admitted to dyspnea on exertion.  No dyspnea at rest or chest pain or palpitations.  No fever or chills.  No nausea or vomiting today but he had vomiting yesterday and denies abdominal pain or diarrhea.  He denied any headache or dizziness or blurred vision.  No dysuria, oliguria or hematuria or flank.  Pain.  He has been having neck pain as well as right knee pain that gives him difficulty sitting up.  Evidence of community-acquired pneumonia.  Elevated procalcitonin.  Started on Treatment with good result.  Hemodynamically stable on room air.  Downtrending procalcitonin.  His main complaint is severe neck pain.   Assessment & Plan:   Active Problems:   Sepsis ([place])  Community-acquired pneumonia/bronchitis Severe sepsis secondary to above Patient with clinical evidence of pneumonia Sepsis criteria met with tachypnea, leukocytosis, acute kidney injury Chest CT without any acute infiltrates Possible early evolving pneumonia Plan: Continue Rocephin and azithromycin Follow blood and urine cultures Bronchodilator Mucolytic's Monitor fever curve and vitals  Severe neck pain Right knee pain Unclear etiology Plain film shows severe osteopenia Suspect this to be chronic No evidence of acute fracture Plan: Multimodal pain control Therapy evaluations as able  Paroxysmal atrial fibrillation Coagulopathy Currently in sinus  rhythm Elevated INR possibly due to Eliquis No evidence of bleeding Plan: Continue Eliquis  Acute exacerbation of COPD Scheduled and as needed bronchodilators No steroids at this time  Acute kidney injury Suspect prerenal azotemia Improved with IV hydration Holding home diuretics and ACE inhibitor  Type 2 diabetes mellitus with hyperglycemia and peripheral neuropathy Continue home Neurontin NovoLog sliding scale  Parkinson's Continue home Sinemet  Essential hypertension PTA Toprol-XL Hold home lisinopril   DVT prophylaxis: Eliquis Code Status: Full Family Communication: None today Disposition Plan: Status is: Inpatient  Remains inpatient appropriate because:Inpatient level of care appropriate due to severity of illness  Dispo: The patient is from: SNF              Anticipated d/c is to: SNF              Patient currently is not medically stable to d/c.   Difficult to place patient No Suspected community-acquired pneumonia.  On IV antibiotics.  Also with severe neck and right right knee pain.  Greater than 48 hours prior to disposition.      Level of care: Med-Surg  Consultants:  None  Procedures:  None  Antimicrobials: Ceftriaxone Azithromycin   Subjective: Patient seen and examined.  Complains of severe neck pain  Objective: Vitals:   10/17/20 0435 10/17/20 0639 10/17/20 0758 10/17/20 1210  BP: 124/60  (!) 143/69 114/76  Pulse: (!) 104  60 99  Resp: 20  18 20   Temp: 98.4 F (36.9 C)  98.3 F (36.8 C) 98.8 F (37.1 C)  TempSrc:  Oral    SpO2: 95%  93% 94%  Weight:        Intake/Output Summary (Last 24 hours)  at 10/17/2020 1214 Last data filed at 10/17/2020 1028 Gross per 24 hour  Intake 2415.79 ml  Output 650 ml  Net 1765.79 ml   Filed Weights   10/16/20 1534  Weight: 103 kg    Examination:  General exam: Very uncomfortable secondary to pain Respiratory system: Bilateral crackles.  Mild end expiratory wheeze.  Normal work of  breathing.  Room air Cardiovascular system: S1-S2, regular rate rhythm, no murmurs, no pedal edema  gastrointestinal system: Obese, nontender, nondistended, normal bowel sounds Central nervous system: Alert and oriented. No focal neurological deficits. Extremities: Decreased power right lower extremity Skin: No rashes, lesions or ulcers Psychiatry: Judgement and insight appear impaired. Mood & affect agitated.     Data Reviewed: I have personally reviewed following labs and imaging studies  CBC: Recent Labs  Lab 10/16/20 1536  WBC 18.0*  NEUTROABS 16.3*  HGB 11.4*  HCT 35.0*  MCV 86.8  PLT 324   Basic Metabolic Panel: Recent Labs  Lab 10/16/20 1536 10/17/20 0612  NA 137 137  K 4.7 4.1  CL 102 104  CO2 24 23  GLUCOSE 153* 174*  BUN 38* 36*  CREATININE 1.61* 1.23  CALCIUM 8.8* 8.2*  MG 1.9  --    GFR: CrCl cannot be calculated (Unknown ideal weight.). Liver Function Tests: Recent Labs  Lab 10/16/20 1536 10/17/20 0612  AST 14* 9*  ALT 6 10  ALKPHOS 72 57  BILITOT 1.2 0.8  PROT 7.1 6.3*  ALBUMIN 3.2* 2.7*   No results for input(s): LIPASE, AMYLASE in the last 168 hours. No results for input(s): AMMONIA in the last 168 hours. Coagulation Profile: Recent Labs  Lab 10/17/20 0612  INR 2.3*   Cardiac Enzymes: No results for input(s): CKTOTAL, CKMB, CKMBINDEX, TROPONINI in the last 168 hours. BNP (last 3 results) No results for input(s): PROBNP in the last 8760 hours. HbA1C: No results for input(s): HGBA1C in the last 72 hours. CBG: No results for input(s): GLUCAP in the last 168 hours. Lipid Profile: No results for input(s): CHOL, HDL, LDLCALC, TRIG, CHOLHDL, LDLDIRECT in the last 72 hours. Thyroid Function Tests: No results for input(s): TSH, T4TOTAL, FREET4, T3FREE, THYROIDAB in the last 72 hours. Anemia Panel: No results for input(s): VITAMINB12, FOLATE, FERRITIN, TIBC, IRON, RETICCTPCT in the last 72 hours. Sepsis Labs: Recent Labs  Lab  10/16/20 1536 10/16/20 1711 10/16/20 1926 10/17/20 0612  PROCALCITON 5.78  --   --  4.16  LATICACIDVEN  --  1.4 1.6  --     Recent Results (from the past 240 hour(s))  Resp Panel by RT-PCR (Flu A&B, Covid) Nasopharyngeal Swab     Status: None   Collection Time: 10/16/20  3:36 PM   Specimen: Nasopharyngeal Swab; Nasopharyngeal(NP) swabs in vial transport medium  Result Value Ref Range Status   SARS Coronavirus 2 by RT PCR NEGATIVE NEGATIVE Final    Comment: (NOTE) SARS-CoV-2 target nucleic acids are NOT DETECTED.  The SARS-CoV-2 RNA is generally detectable in upper respiratory specimens during the acute phase of infection. The lowest concentration of SARS-CoV-2 viral copies this assay can detect is 138 copies/mL. A negative result does not preclude SARS-Cov-2 infection and should not be used as the sole basis for treatment or other patient management decisions. A negative result may occur with  improper specimen collection/handling, submission of specimen other than nasopharyngeal swab, presence of viral mutation(s) within the areas targeted by this assay, and inadequate number of viral copies(<138 copies/mL). A negative result must be combined with  clinical observations, patient history, and epidemiological information. The expected result is Negative.  Fact Sheet for Patients:  EntrepreneurPulse.com.au  Fact Sheet for Healthcare Providers:  IncredibleEmployment.be  This test is no t yet approved or cleared by the [place] FDA and  has been authorized for detection and/or diagnosis of SARS-CoV-2 by FDA under an Emergency Use Authorization (EUA). This EUA will remain  in effect (meaning this test can be used) for the duration of the COVID-19 declaration under Section 564(b)(1) of the Act, 21 U.S.C.section 360bbb-3(b)(1), unless the authorization is terminated  or revoked sooner.       Influenza A by PCR NEGATIVE NEGATIVE Final    Influenza B by PCR NEGATIVE NEGATIVE Final    Comment: (NOTE) The Xpert Xpress SARS-CoV-2/FLU/RSV plus assay is intended as an aid in the diagnosis of influenza from Nasopharyngeal swab specimens and should not be used as a sole basis for treatment. Nasal washings and aspirates are unacceptable for Xpert Xpress SARS-CoV-2/FLU/RSV testing.  Fact Sheet for Patients: EntrepreneurPulse.com.au  Fact Sheet for Healthcare Providers: IncredibleEmployment.be  This test is not yet approved or cleared by the [place] FDA and has been authorized for detection and/or diagnosis of SARS-CoV-2 by FDA under an Emergency Use Authorization (EUA). This EUA will remain in effect (meaning this test can be used) for the duration of the COVID-19 declaration under Section 564(b)(1) of the Act, 21 U.S.C. section 360bbb-3(b)(1), unless the authorization is terminated or revoked.  Performed at [facility], [address] [place], [place] [address]   Blood culture (routine x 2)     Status: None (Preliminary result)   Collection Time: 10/16/20  7:26 PM   Specimen: BLOOD LEFT FOREARM  Result Value Ref Range Status   Specimen Description BLOOD LEFT FOREARM  Final   Special Requests   Final    BOTTLES DRAWN AEROBIC AND ANAEROBIC Blood Culture adequate volume   Culture  Setup Time   Final    GRAM POSITIVE COCCI IN BOTH AEROBIC AND ANAEROBIC BOTTLES Organism ID to follow CRITICAL RESULT CALLED TO, READ BACK BY AND VERIFIED WITH: KRISTEN MERRIL, PHARMD AT 0830 ON 10/17/20 BY GM Performed at [facility], [address] [place], [place] [address]    Culture GRAM POSITIVE COCCI  Final   Report Status PENDING  Incomplete  Blood Culture ID Panel (Reflexed)     Status: Abnormal   Collection Time: 10/16/20  7:26 PM  Result Value Ref Range Status   Enterococcus faecalis NOT DETECTED NOT DETECTED Final   Enterococcus Faecium NOT DETECTED NOT DETECTED  Final   Listeria monocytogenes NOT DETECTED NOT DETECTED Final   Staphylococcus species NOT DETECTED NOT DETECTED Final   Staphylococcus aureus (BCID) NOT DETECTED NOT DETECTED Final   Staphylococcus epidermidis NOT DETECTED NOT DETECTED Final   Staphylococcus lugdunensis NOT DETECTED NOT DETECTED Final   Streptococcus species DETECTED (A) NOT DETECTED Final    Comment: Not Enterococcus species, Streptococcus agalactiae, Streptococcus pyogenes, or Streptococcus pneumoniae. CRITICAL RESULT CALLED TO, READ BACK BY AND VERIFIED WITH: KRISTEN MERRIL, PHARMD AT 0830 ON 10/17/20 BY GM    Streptococcus agalactiae NOT DETECTED NOT DETECTED Final   Streptococcus pneumoniae NOT DETECTED NOT DETECTED Final   Streptococcus pyogenes NOT DETECTED NOT DETECTED Final   A.calcoaceticus-baumannii NOT DETECTED NOT DETECTED Final   Bacteroides fragilis NOT DETECTED NOT DETECTED Final   Enterobacterales NOT DETECTED NOT DETECTED Final   Enterobacter cloacae complex NOT DETECTED NOT DETECTED Final   Escherichia coli NOT DETECTED  NOT DETECTED Final   Klebsiella aerogenes NOT DETECTED NOT DETECTED Final   Klebsiella oxytoca NOT DETECTED NOT DETECTED Final   Klebsiella pneumoniae NOT DETECTED NOT DETECTED Final   Proteus species NOT DETECTED NOT DETECTED Final   Salmonella species NOT DETECTED NOT DETECTED Final   Serratia marcescens NOT DETECTED NOT DETECTED Final   Haemophilus influenzae NOT DETECTED NOT DETECTED Final   Neisseria meningitidis NOT DETECTED NOT DETECTED Final   Pseudomonas aeruginosa NOT DETECTED NOT DETECTED Final   Stenotrophomonas maltophilia NOT DETECTED NOT DETECTED Final   Candida albicans NOT DETECTED NOT DETECTED Final   Candida auris NOT DETECTED NOT DETECTED Final   Candida glabrata NOT DETECTED NOT DETECTED Final   Candida krusei NOT DETECTED NOT DETECTED Final   Candida parapsilosis NOT DETECTED NOT DETECTED Final   Candida tropicalis NOT DETECTED NOT DETECTED Final    Cryptococcus neoformans/gattii NOT DETECTED NOT DETECTED Final    Comment: Performed at [facility], [address] [place], [place] [address]  Blood culture (routine x 2)     Status: None (Preliminary result)   Collection Time: 10/16/20 11:23 PM   Specimen: BLOOD RIGHT FOREARM  Result Value Ref Range Status   Specimen Description BLOOD RIGHT FOREARM  Final   Special Requests   Final    BOTTLES DRAWN AEROBIC AND ANAEROBIC Blood Culture adequate volume   Culture   Final    NO GROWTH < 12 HOURS Performed at [facility], [place], [place] [address]    Report Status PENDING  Incomplete         Radiology Studies: DG Cervical Spine 2 or 3 views  Result Date: 10/17/2020 CLINICAL DATA:  Neck pain. EXAM: CERVICAL SPINE - 2-3 VIEW COMPARISON:  No prior. FINDINGS: Very limited exam due to positioning and motion artifact. Postsurgical changes cervical spine consistent prior anterior posterior fusion and mid cervical spine corpectomy. Hardware intact. Anatomic alignment. Severe osteopenia and degenerative change cervical spine. No acute bony abnormality identified. Probable carotid vascular calcification. IMPRESSION: 1. Very limited exam due to positioning and motion artifact. Postsurgical changes cervical spine. Hardware intact. Severe osteopenia and degenerative change cervical spine. 2.  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 Electronically Signed   By: Marcello Moores  Register   On: 10/17/2020 11:33   CT Head Wo Contrast  Result Date: 10/16/2020 CLINICAL DATA:  Weakness for 4 days. Altered mental status. Disoriented EXAM: CT HEAD WITHOUT CONTRAST TECHNIQUE: Contiguous axial images were obtained from the base of the skull through the vertex without intravenous contrast. COMPARISON:  None. FINDINGS: Brain: Age related atrophy. No intracranial hemorrhage, mass effect, or midline shift. No hydrocephalus. The basilar cisterns are patent. Moderate periventricular and deep white  matter hypodensity consistent with chronic small vessel ischemia. No evidence of territorial infarct or acute ischemia. No extra-axial or intracranial fluid collection. Vascular: Atherosclerosis of skullbase vasculature without hyperdense vessel or abnormal calcification. Skull: No fracture or focal lesion. Sinuses/Orbits: Paranasal sinuses and mastoid air cells are clear. The visualized orbits are unremarkable. Other: There is prominent degenerative pannus at C1-C2 which causes mild mass effect and narrowing of the cranial cervical junction, only partially included in the field of view. IMPRESSION: 1. No acute intracranial abnormality. 2. Age related atrophy and chronic small vessel ischemia. 3. Prominent degenerative pannus at C1-C2 which causes mild mass effect and narrowing of the cranial cervical junction, only partially included in the field of view. Electronically Signed   By: Keith Rake M.D.   On: 10/16/2020 18:22  CT Chest Wo Contrast  Result Date: 10/16/2020 CLINICAL DATA:  Cough and leukocytosis. EXAM: CT CHEST WITHOUT CONTRAST TECHNIQUE: Multidetector CT imaging of the chest was performed following the standard protocol without IV contrast. COMPARISON:  Radiograph earlier today.  Chest CTA 12/28/2019 FINDINGS: Cardiovascular: Aortic atherosclerosis and tortuosity. No aortic aneurysm. No periaortic stranding. Mild dilatation of the central pulmonary artery at 3.6 cm. Cardiomegaly with advanced coronary artery calcifications. Presumed planted metallic densities in the left ventricle. No significant pericardial effusion. Small amount of fluid in the superior pericardial recess. Mediastinum/Nodes: No enlarged mediastinal lymph nodes. Limited assessment for hilar adenopathy in this unenhanced exam. Tiny hiatal hernia. No suspicious thyroid nodule. Lungs/Pleura: No consolidation to suggest pneumonia. Dependent atelectasis in the right greater than left lower lobe. No septal thickening or pulmonary  edema. No pleural fluid. No mass or suspicious nodule. Trachea and central bronchi are patent. Upper Abdomen: Low-density lesion in the right lobe of the liver is similar to prior exam, incompletely characterized on this unenhanced exam. Gallstone noted without pericholecystic fat stranding. No acute findings in the upper abdomen. Musculoskeletal: Diffuse thoracic spondylosis and degenerative disc disease. Advanced degenerative change of both shoulders. Similar sclerotic appearance of the posterior right fifth rib, stable in appearance similar but less pronounced appearance of the left eighth and eleventh ribs. No new osseous findings. No chest wall soft tissue abnormality. IMPRESSION: 1. No evidence of pneumonia. Dependent atelectasis in the right greater than left lower lobe. 2. Cardiomegaly with advanced coronary artery calcifications. 3. Mild dilatation of the central pulmonary artery, can be seen with pulmonary arterial hypertension. 4. Cholelithiasis incidentally noted in the upper abdomen. 5. Sclerotic cortical thickening of bilateral ribs is unchanged from September of 2021 exam. Findings may be related to Paget's disease, prior injury, or sclerotic metastasis. Overall stability over 10 months. Aortic Atherosclerosis (ICD10-I70.0). Electronically Signed   By: Keith Rake M.D.   On: 10/16/2020 18:29   DG Chest Portable 1 View  Result Date: 10/16/2020 CLINICAL DATA:  Progressive weakness EXAM: PORTABLE CHEST 1 VIEW COMPARISON:  12/16/2019 FINDINGS: Low lung volumes with central bronchovascular crowding. Mild cardiomegaly with probable slight central congestion. Aortic atherosclerosis. No pneumothorax. IMPRESSION: Low lung volumes.  Mild cardiomegaly with slight central congestion Electronically Signed   By: Donavan Foil M.D.   On: 10/16/2020 16:10        Scheduled Meds:  apixaban  5 mg Oral BID   ascorbic acid  500 mg Oral Daily   carbidopa-levodopa  1 tablet Oral TID   Chlorhexidine  Gluconate Cloth  6 each Topical Daily   escitalopram  5 mg Oral Daily   finasteride  5 mg Oral Daily   fluticasone  1 spray Each Nare Daily   gabapentin  800 mg Oral BID   guaiFENesin  600 mg Oral Q12H   ipratropium  1 spray Each Nare BID   ipratropium-albuterol  3 mL Nebulization TID   ketorolac  15 mg Intravenous Q6H   lactase  3,000 Units Oral TID WC   loratadine  10 mg Oral Daily   methylPREDNISolone (SOLU-MEDROL) injection  20 mg Intravenous Q12H   metoprolol succinate  50 mg Oral Daily   montelukast  10 mg Oral QHS   tamsulosin  0.4 mg Oral Daily   traZODone  100 mg Oral QHS   Continuous Infusions:  sodium chloride 100 mL/hr at 10/17/20 0716   cefTRIAXone (ROCEPHIN)  IV       LOS: 1 day    Time spent: 47  minutes    Sidney Ace, MD [facility] Pager [phone]  If 7PM-7AM, please contact night-coverage 10/17/2020, 12:14 PM

## 2020-10-17 NOTE — Progress Notes (Signed)
PHARMACY - PHYSICIAN COMMUNICATION CRITICAL VALUE ALERT - BLOOD CULTURE IDENTIFICATION (BCID)  Raymond Hays is an 75 y.o. male who presented to [facility] on 10/16/2020 with a chief complaint of weakness and exertional dyspnea  Assessment:  Blood culture from 7/18 with GPC in 1 of 2 sets (both bottles of the one set), BCID = Streptococcus species (not pneumoniae, GBS or GAS)  Name of physician (or Provider) Contacted: Dr Georgeann Oppenheim  Current antibiotics: Ceftriaxone/azithromycin  Changes to prescribed antibiotics recommended:  Recommendations accepted by provider - continue ceftriaxone, stop azithromycin   Results for orders placed or performed during the hospital encounter of 10/16/20  Blood Culture ID Panel (Reflexed) (Collected: 10/16/2020  7:26 PM)  Result Value Ref Range   Enterococcus faecalis NOT DETECTED NOT DETECTED   Enterococcus Faecium NOT DETECTED NOT DETECTED   Listeria monocytogenes NOT DETECTED NOT DETECTED   Staphylococcus species NOT DETECTED NOT DETECTED   Staphylococcus aureus (BCID) NOT DETECTED NOT DETECTED   Staphylococcus epidermidis NOT DETECTED NOT DETECTED   Staphylococcus lugdunensis NOT DETECTED NOT DETECTED   Streptococcus species DETECTED (A) NOT DETECTED   Streptococcus agalactiae NOT DETECTED NOT DETECTED   Streptococcus pneumoniae NOT DETECTED NOT DETECTED   Streptococcus pyogenes NOT DETECTED NOT DETECTED   A.calcoaceticus-baumannii NOT DETECTED NOT DETECTED   Bacteroides fragilis NOT DETECTED NOT DETECTED   Enterobacterales NOT DETECTED NOT DETECTED   Enterobacter cloacae complex NOT DETECTED NOT DETECTED   Escherichia coli NOT DETECTED NOT DETECTED   Klebsiella aerogenes NOT DETECTED NOT DETECTED   Klebsiella oxytoca NOT DETECTED NOT DETECTED   Klebsiella pneumoniae NOT DETECTED NOT DETECTED   Proteus species NOT DETECTED NOT DETECTED   Salmonella species NOT DETECTED NOT DETECTED   Serratia marcescens NOT DETECTED NOT DETECTED   Haemophilus  influenzae NOT DETECTED NOT DETECTED   Neisseria meningitidis NOT DETECTED NOT DETECTED   Pseudomonas aeruginosa NOT DETECTED NOT DETECTED   Stenotrophomonas maltophilia NOT DETECTED NOT DETECTED   Candida albicans NOT DETECTED NOT DETECTED   Candida auris NOT DETECTED NOT DETECTED   Candida glabrata NOT DETECTED NOT DETECTED   Candida krusei NOT DETECTED NOT DETECTED   Candida parapsilosis NOT DETECTED NOT DETECTED   Candida tropicalis NOT DETECTED NOT DETECTED   Cryptococcus neoformans/gattii NOT DETECTED NOT DETECTED    Juliette Alcide, PharmD, BCPS.   Work Cell: [phone] 10/17/2020 9:08 AM

## 2020-10-17 NOTE — Evaluation (Signed)
Physical Therapy Evaluation Patient Details Name: Raymond Hays MRN: [id number] DOB: 1945/09/08 Today's Date: 10/17/2020   History of Present Illness  Raymond Hays is a 74yoM who comes to [facility] on 7/18 from [facility] with 4 days weakness, DOE. PMH: DM2, HTN, COPD, GAD, PND, PAF. Pt admitted c ARI, sepsis, AKI, dehydration. At baseline pt is able to perform supervised transfers bed to/from [facility], able to perform intermittent limited self propulsion in WC. Pt reportedly could AMB up to 53ft with therapy team using a RW.  Clinical Impression  Pt admitted with above diagnosis. Pt currently with functional limitations due to the deficits listed below (see "PT Problem List"). Upon entry, pt in bed, awake and already working with OT for evaluation. The pt is alert, pleasant, interactive, but clearly still having pain issues, albeit not as severe as earlier this AM, anxiety much better controlled. Pt able to provide some basic info regarding prior level of function, both in tolerance and independence, but regarding his current severe neck pain and leg pain, he is not able to provide any meaningful insight regarding current exacerbation other that to reference a remote neck surgery. Pt requires totalA +2 for bed mobility and despite tolerating EOB somewhat well for ~15 minutes, is never really able to sit >10 seconds without staff supporting his trunk, continuous posterior drift and lean. Pt much too weak in legs today to safely attempt transfer, use of BUE also provocative of severe neck pain. At EOB, breathing sounds pathological, but Thereasa Parkin will defer detailed description to RN/RT who are in the room during much of evaluation. Patient's performance this date reveals decreased ability, independence, and tolerance in performing all basic mobility required for performance of activities of daily living. Pt requires additional DME, close physical assistance, and cues for safe participate in mobility. Pt will benefit from  skilled PT intervention to increase independence and safety with basic mobility in preparation for discharge to the venue listed below.       Follow Up Recommendations Supervision for mobility/OOB;Home health PT;Supervision - Intermittent (Return to facility with any services available. Does not need high frequency services.)    Equipment Recommendations  None recommended by PT    Recommendations for Other Services       Precautions / Restrictions Precautions Precautions: Fall Restrictions Weight Bearing Restrictions: No      Mobility  Bed Mobility Overal bed mobility: Needs Assistance Bed Mobility: Supine to Sit;Sit to Supine     Supine to sit: Max assist;Total assist;+2 for physical assistance;HOB elevated Sit to supine: Max assist;Total assist;+2 for physical assistance   General bed mobility comments: Pt requiring MAX-TOTAL Ax2 to perform all bed mobility d/t pain at neck, R knee, and shoulders    Transfers                 General transfer comment: unable/unsafe to attempt this date  Ambulation/Gait                Stairs            Wheelchair Mobility    Modified Rankin (Stroke Patients Only)       Balance Overall balance assessment: Needs assistance Sitting-balance support: Bilateral upper extremity supported;Feet supported Sitting balance-Leahy Scale: Poor Sitting balance - Comments: Intermittent MOD-MAX A to maintain static sitting balance at EOB  Pertinent Vitals/Pain Pain Assessment: 0-10 Pain Score: 8  Pain Location: neck Pain Descriptors / Indicators: Aching;Discomfort Pain Intervention(s): Limited activity within patient's tolerance;Monitored during session;Premedicated before session;Repositioned    Home Living Family/patient expects to be discharged to:: Skilled nursing facility                 Additional Comments: Pt resides at [facility]    Prior Function  Level of Independence: Needs assistance   Gait / Transfers Assistance Needed: Pt reports at baseline being able to complete transfers to/from bed/WC (occasionally propelling WC himself) and ambulate limited distances (~50 ft) with RW and MIN A/SUPERVISION  ADL's / Homemaking Assistance Needed: Pt reports being independent with feeding and grooming tasks. Pt reports requiring MAX A for toileting, bathing, and dressing.        Hand Dominance        Extremity/Trunk Assessment   Upper Extremity Assessment Upper Extremity Assessment: RUE deficits/detail;LUE deficits/detail RUE Deficits / Details: Pt complaining of significant pain during attempts to perform shoulder flexion and elbow flexion. RUE: Unable to fully assess due to pain RUE Sensation: WNL LUE Deficits / Details: Pt complaining of significant pain during attempts to perform shoulder flexion. Pt with baseline limited ROM of wrist d/t prior surgeries. Contraction of digit flexors, with pt stating that he typically only uses LUE as functional assist during ADLs LUE: Unable to fully assess due to pain LUE Coordination: decreased fine motor    Lower Extremity Assessment Lower Extremity Assessment: Generalized weakness;Defer to PT evaluation       Communication      Cognition Arousal/Alertness: Awake/alert Behavior During Therapy: [facility] for tasks assessed/performed Overall Cognitive Status: No family/caregiver present to determine baseline cognitive functioning                                 General Comments: Alert and oriented to self, place, and situation. Pt agreeable to mobility following encouragement. Pt complaining of significant pain along multiple joints, however when asked about how long he has been experiencing pain, pt referring to surgeries that occurred years ago and this Thereasa Parkin was unable to obtain clear reason for recent increase in pain      General Comments      Exercises Other  Exercises Other Exercises: LAQ 1x10 bilat at EOB (modA of trunk to perform) Other Exercises: Rt ankle DF 1x10x3secH (modA of trunk to perform)   Assessment/Plan    PT Assessment Patient needs continued PT services  PT Problem List Decreased strength;Decreased range of motion;Decreased activity tolerance;Decreased balance;Decreased mobility;Decreased coordination;Decreased safety awareness;Decreased knowledge of use of DME;Decreased knowledge of precautions;Cardiopulmonary status limiting activity       PT Treatment Interventions Balance training;Functional mobility training;Therapeutic activities;Therapeutic exercise;Patient/family education;Neuromuscular re-education    PT Goals (Current goals can be found in the Care Plan section)  Acute Rehab PT Goals Patient Stated Goal: be able to perform WC transfers again, have less pain PT Goal Formulation: With patient Time For Goal Achievement: 10/31/20 Potential to Achieve Goals: Poor    Frequency Min 2X/week   Barriers to discharge        Co-evaluation   Reason for Co-Treatment: Complexity of the patient's impairments (multi-system involvement);For patient/therapist safety PT goals addressed during session: Mobility/safety with mobility;Balance OT goals addressed during session: ADL's and self-care       AM-PAC PT "6 Clicks" Mobility  Outcome Measure Help needed turning from your back to your  side while in a flat bed without using bedrails?: Total Help needed moving from lying on your back to sitting on the side of a flat bed without using bedrails?: Total Help needed moving to and from a bed to a chair (including a wheelchair)?: Total Help needed standing up from a chair using your arms (e.g., wheelchair or bedside chair)?: Total Help needed to walk in hospital room?: Total Help needed climbing 3-5 steps with a railing? : Total 6 Click Score: 6    End of Session Equipment Utilized During Treatment: Gait belt;Oxygen Activity  Tolerance: Patient tolerated treatment well;Patient limited by fatigue;Patient limited by pain Patient left: in bed;with call bell/phone within reach;with nursing/sitter in room Nurse Communication: Mobility status PT Visit Diagnosis: Difficulty in walking, not elsewhere classified (R26.2);Other abnormalities of gait and mobility (R26.89);Muscle weakness (generalized) (M62.81);History of falling (Z91.81)    Time: 8338-2505 PT Time Calculation (min) (ACUTE ONLY): 27 min   Charges:   PT Evaluation $PT Eval High Complexity: 1 High PT Treatments $Therapeutic Exercise: 8-22 mins      4:47 PM, 10/17/20 Rosamaria Lints, PT, DPT Physical Therapist - [facility]  [phone] (ASCOM)    Krystal Delduca C 10/17/2020, 4:40 PM

## 2020-10-17 NOTE — Plan of Care (Signed)

## 2020-10-18 ENCOUNTER — Encounter: Payer: Self-pay | Admitting: Family Medicine

## 2020-10-18 ENCOUNTER — Inpatient Hospital Stay (HOSPITAL_COMMUNITY)
Admit: 2020-10-18 | Discharge: 2020-10-18 | Disposition: A | Discharge status: Home / Self Care | Payer: No Typology Code available for payment source | Attending: Internal Medicine | Admitting: Internal Medicine

## 2020-10-18 ENCOUNTER — Inpatient Hospital Stay: Payer: No Typology Code available for payment source

## 2020-10-18 DIAGNOSIS — A409 Streptococcal sepsis, unspecified: Secondary | ICD-10-CM

## 2020-10-18 DIAGNOSIS — I059 Rheumatic mitral valve disease, unspecified: Secondary | ICD-10-CM

## 2020-10-18 DIAGNOSIS — R7881 Bacteremia: Secondary | ICD-10-CM | POA: Diagnosis not present

## 2020-10-18 DIAGNOSIS — B955 Unspecified streptococcus as the cause of diseases classified elsewhere: Secondary | ICD-10-CM

## 2020-10-18 DIAGNOSIS — M542 Cervicalgia: Secondary | ICD-10-CM | POA: Diagnosis not present

## 2020-10-18 DIAGNOSIS — M25561 Pain in right knee: Secondary | ICD-10-CM

## 2020-10-18 DIAGNOSIS — G20 Parkinson's disease: Secondary | ICD-10-CM | POA: Diagnosis not present

## 2020-10-18 DIAGNOSIS — E1143 Type 2 diabetes mellitus with diabetic autonomic (poly)neuropathy: Secondary | ICD-10-CM

## 2020-10-18 DIAGNOSIS — G20A1 Parkinson's disease without dyskinesia, without mention of fluctuations: Secondary | ICD-10-CM

## 2020-10-18 DIAGNOSIS — E1140 Type 2 diabetes mellitus with diabetic neuropathy, unspecified: Secondary | ICD-10-CM

## 2020-10-18 DIAGNOSIS — N179 Acute kidney failure, unspecified: Secondary | ICD-10-CM

## 2020-10-18 DIAGNOSIS — I10 Essential (primary) hypertension: Secondary | ICD-10-CM

## 2020-10-18 DIAGNOSIS — N189 Chronic kidney disease, unspecified: Secondary | ICD-10-CM

## 2020-10-18 DIAGNOSIS — E1142 Type 2 diabetes mellitus with diabetic polyneuropathy: Secondary | ICD-10-CM

## 2020-10-18 LAB — ECHOCARDIOGRAM COMPLETE
AR max vel: 2.41 cm2
AV Area VTI: 2.76 cm2
AV Area mean vel: 2.48 cm2
AV Mean grad: 2 mmHg
AV Peak grad: 4.1 mmHg
Ao pk vel: 1.01 m/s
Area-P 1/2: 2.88 cm2
MV VTI: 1.35 cm2
S' Lateral: 2.33 cm
Weight: 3632 oz

## 2020-10-18 LAB — GLUCOSE, CAPILLARY: Glucose-Capillary: 199 mg/dL — ABNORMAL HIGH (ref 70–99)

## 2020-10-18 LAB — PROCALCITONIN: Procalcitonin: 2.92 ng/mL

## 2020-10-18 MED ORDER — IOHEXOL 300 MG/ML  SOLN
75.0000 mL | Freq: Once | INTRAMUSCULAR | Status: AC | PRN
  Administered 2020-10-18: 75 mL via INTRAVENOUS

## 2020-10-18 MED ORDER — METHYLPREDNISOLONE SODIUM SUCC 40 MG IJ SOLR
20.0000 mg | Freq: Every day | INTRAMUSCULAR | Status: DC
  Administered 2020-10-19: 20 mg via INTRAVENOUS
  Filled 2020-10-18: qty 1

## 2020-10-18 NOTE — Consult Note (Signed)
ORTHOPAEDIC CONSULTATION  REQUESTING PHYSICIAN: Alford Highland, MD  Chief Complaint:   Right elbow and right knee pain (rule out septic arthritis)  History of Present Illness: Raymond Hays is a 75 y.o. male With a past medical history significant for for type 2 diabetes mellitus, dyslipidemia, hypertension, and COPD who presented to the ER on 10/16/2020 with acute onset of generalized weakness at his [facility].  He was admitted due to concern for sepsis and presumed pneumonia.  He was started on IV antibiotics. He also reported significant right knee pain and right elbow pain at the time.  Blood cultures grew out strep species.  Primary team had concern for a septic joint, prompting the consult.  The patient states that he has had chronic right elbow and right knee pain.  He has a known meniscus tear in the right knee as that pain began approximately 2 months ago.  He states he did have an increase in pain at the time of admission with spasm-like sensations about the knee.  He states that since admission, both the right elbow and right knee pain have improved significantly.  He denies any notable swelling, warmth, or erythema about the knee or elbow.   Past Medical History:  Diagnosis Date   Allergic rhinitis    Anxiety    Arthritis    Chronic indwelling Foley catheter    COPD (chronic obstructive pulmonary disease) (HCC)    Cough    Diabetes (HCC)    Essential (primary) hypertension    Hemorrhoid    Hyperlipidemia    PAF (paroxysmal atrial fibrillation) (HCC)    Primary osteoarthritis, unspecified shoulder    Retention of urine, unspecified    Spinal stenosis    UTI (urinary tract infection)    Past Surgical History:  Procedure Laterality Date   APPENDECTOMY     BACK SURGERY     ELBOW SURGERY     HERNIA REPAIR     KNEE SURGERY     neck     PARTIAL HIP ARTHROPLASTY Bilateral    TONSILLECTOMY     Social History    Socioeconomic History   Marital status: Divorced    Spouse name: Not on file   Number of children: Not on file   Years of education: Not on file   Highest education level: Not on file  Occupational History   Occupation: retired    Comment: retired Hotel manager x 4 years; Scientist, physiological; Arboriculturist as Academic librarian  Tobacco Use   Smoking status: Former    Packs/day: 0.50    Years: 10.00    Pack years: 5.00    Types: Cigarettes    Quit date: 2006    Years since quitting: 16.5   Smokeless tobacco: Never  Vaping Use   Vaping Use: Never used  Substance and Sexual Activity   Alcohol use: Not Currently   Drug use: Not Currently    Types: "Crack" cocaine    Comment: quit "crack" 15 years ago--11/04/2019   Sexual activity: Not on file  Other Topics Concern   Not on file  Social History Narrative   Not on file   Social Determinants of Health   Financial Resource Strain: Not on file  Food Insecurity: Not on file  Transportation Needs: Not on file  Physical Activity: Not on file  Stress: Not on file  Social Connections: Not on file   Family History  Problem Relation Age of Onset   Healthy Son    Healthy Son  No Known Allergies Prior to Admission medications   Medication Sig Start Date End Date Taking? Authorizing Provider  acetaminophen (TYLENOL) 325 MG tablet Take 650 mg by mouth every 4 (four) hours as needed.   Yes [provider]  apixaban (ELIQUIS) 5 MG TABS tablet Take 5 mg by mouth 2 (two) times daily.  02/06/17  Yes [provider]  atorvastatin (LIPITOR) 20 MG tablet Take 20 mg by mouth daily.   Yes [provider]  cetirizine (ZYRTEC) 10 MG tablet Take 10 mg by mouth daily.    Yes [provider]  diclofenac Sodium (VOLTAREN) 1 % GEL Apply 4 g topically 4 (four) times daily.   Yes [provider]  escitalopram (LEXAPRO) 5 MG tablet Take 5 mg by mouth daily.   Yes [provider]  finasteride (PROSCAR) 5 MG tablet Take 5  mg by mouth daily.   Yes [provider]  gabapentin (NEURONTIN) 800 MG tablet Take 800 mg by mouth 2 (two) times daily.    Yes [provider]  guaiFENesin (MUCINEX) 600 MG 12 hr tablet Take 600 mg by mouth every 12 (twelve) hours.   Yes [provider]  ipratropium (ATROVENT) 0.03 % nasal spray Place 2 sprays into both nostrils 2 (two) times daily. 02/18/20  Yes [provider]  lactase (LACTAID) 3000 units tablet Take 3,000 Units by mouth 3 (three) times daily with meals.   Yes [provider]  methocarbamol (ROBAXIN) 500 MG tablet Take 1,000 mg by mouth in the morning and at bedtime.    Yes [provider]  metoprolol succinate (TOPROL-XL) 50 MG 24 hr tablet Take 50 mg by mouth daily.    Yes [provider]  montelukast (SINGULAIR) 10 MG tablet Take 10 mg by mouth at bedtime.   Yes [provider]  nitrofurantoin, macrocrystal-monohydrate, (MACROBID) 100 MG capsule Take 100 mg by mouth at bedtime.   Yes [provider]  polyethylene glycol (MIRALAX / GLYCOLAX) 17 g packet Take 17 g by mouth daily.   Yes [provider]  predniSONE (DELTASONE) 20 MG tablet Take 20 mg by mouth daily with breakfast.   Yes [provider]  simethicone (MYLICON) 125 MG chewable tablet Chew 125 mg by mouth 3 (three) times daily.   Yes [provider]  Skin Protectants, Misc. (MINERIN CREME EX) Apply topically. Apply to feet twice bid   Yes [provider]  tamsulosin (FLOMAX) 0.4 MG CAPS capsule Take 0.4 mg by mouth 2 (two) times daily.   Yes [provider]  torsemide (DEMADEX) 10 MG tablet Take 10 mg by mouth daily.   Yes [provider]  traMADol (ULTRAM) 50 MG tablet Take 100 mg by mouth in the morning.    Yes [provider]  traZODone (DESYREL) 100 MG tablet Take 100 mg by mouth at bedtime.   Yes [provider]  TRELEGY ELLIPTA 100-62.5-25 MCG/INH AEPB Inhale 1  puff into the lungs daily. 02/17/20  Yes [provider]  triamcinolone cream (KENALOG) 0.1 % Apply 1 application topically 2 (two) times daily.   Yes [provider]  Albuterol Sulfate (PROAIR RESPICLICK) 108 (90 Base) MCG/ACT AEPB Inhale 2 puffs into the lungs every 4 (four) hours as needed (COPD).    [provider]  ANTI-DANDRUFF 1 % SHAM Apply topically. 02/18/20   [provider]  ascorbic acid (VITAMIN C) 500 MG tablet Take by mouth.  Patient not taking: No sig reported  [provider]  benzonatate (TESSALON) 100 MG capsule Take by mouth 2 (two) times daily as needed for cough. Patient not taking: No sig reported    [provider]  bisacodyl (DULCOLAX) 10 MG suppository Place 10 mg rectally as needed for moderate constipation.    [provider]  Budeson-Glycopyrrol-Formoterol (BREZTRI AEROSPHERE) 160-9-4.8 MCG/ACT AERO Inhale 2 puffs into the lungs in the morning and at bedtime. Patient not taking: No sig reported    [provider]  calcium carbonate (TUMS - DOSED IN MG ELEMENTAL CALCIUM) 500 MG chewable tablet Chew 2 tablets by mouth every 6 (six) hours as needed for indigestion or heartburn.    [provider]  carbidopa-levodopa (SINEMET IR) 25-100 MG tablet Take 1 tablet by mouth 3 (three) times daily before meals. At 0630, 1130, 1630    [provider]  carboxymethylcellulose (REFRESH PLUS) 0.5 % SOLN 1 drop as needed.    [provider]  clobetasol cream (TEMOVATE) 0.05 % Apply topically. 01/12/20   [provider]  diphenhydrAMINE-zinc acetate (BENADRYL ITCH STOPPING) cream Apply topically every 4 (four) hours.    [provider]  Docusate Sodium (STOOL SOFTENER LAXATIVE PO) Take 1 tablet by mouth in the morning and at bedtime.    [provider]  Emollient (CETAPHIL) cream Apply 1 application topically as needed.    [provider]  Emollient  (EUCERIN DAILY PROTECTION/SPF30) LOTN Apply topically as needed.    [provider]  fluticasone (FLONASE) 50 MCG/ACT nasal spray Place 1 spray into both nostrils daily.    [provider]  furosemide (LASIX) 20 MG tablet Take by mouth. Patient not taking: No sig reported 01/27/20   [provider]  hydrocortisone (ANUSOL-HC) 2.5 % rectal cream Place 1 application rectally 2 (two) times daily. 09/15/19   Toney Reil, MD  hydrocortisone cream 0.5 % Apply topically.    [provider]  hydrOXYzine (ATARAX/VISTARIL) 25 MG tablet Take 25 mg by mouth 2 (two) times daily as needed for itching. 02/17/20   [provider]  ibuprofen (ADVIL) 200 MG tablet Take 200 mg by mouth 2 (two) times daily as needed (jaw pain).    [provider]  ipratropium (ATROVENT) 0.06 % nasal spray Place into the nose. Patient not taking: No sig reported    [provider]  ipratropium-albuterol (DUONEB) 0.5-2.5 (3) MG/3ML SOLN Inhale 3 mLs into the lungs daily. And every 6 hours as needed for shortness of breath or wheezing    [provider]  Lidocaine 3 % CREA Apply topically daily as needed.    [provider]  lisinopril (ZESTRIL) 2.5 MG tablet Take 2.5 mg by mouth daily. Patient not taking: No sig reported    [provider]  loperamide (IMODIUM) 2 MG capsule Take 4 mg by mouth as needed for diarrhea or loose stools.    [provider]  metFORMIN (GLUCOPHAGE-XR) 500 MG 24 hr tablet Take 500 mg by mouth daily.     [provider]  nystatin (MYCOSTATIN/NYSTOP) powder  10/21/18   [provider]  psyllium (METAMUCIL) 58.6 % powder Take 1 packet by mouth daily.    [provider]  SARNA lotion Apply topically. 02/25/20   [provider]  senna-docusate (SENOKOT-S) 8.6-50 MG tablet Take by mouth. 10/21/18   [provider]   Recent Labs    10/16/20 1536 10/17/20 0612  WBC  18.0*  --   HGB 11.4*  --  HCT 35.0*  --   PLT 196  --   K 4.7 4.1  CL 102 104  CO2 24 23  BUN 38* 36*  CREATININE 1.61* 1.23  GLUCOSE 153* 174*  CALCIUM 8.8* 8.2*  INR  --  2.3*   DG Cervical Spine 2 or 3 views  Result Date: 10/17/2020 CLINICAL DATA:  Neck pain. EXAM: CERVICAL SPINE - 2-3 VIEW COMPARISON:  No prior. FINDINGS: Very limited exam due to positioning and motion artifact. Postsurgical changes cervical spine consistent prior anterior posterior fusion and mid cervical spine corpectomy. Hardware intact. Anatomic alignment. Severe osteopenia and degenerative change cervical spine. No acute bony abnormality identified. Probable carotid vascular calcification. IMPRESSION: 1. Very limited exam due to positioning and motion artifact. Postsurgical changes cervical spine. Hardware intact. Severe osteopenia and degenerative change cervical spine. 2.  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 Electronically Signed   By: Maisie Fus  Register   On: 10/17/2020 11:33   CT Head Wo Contrast  Result Date: 10/16/2020 CLINICAL DATA:  Weakness for 4 days. Altered mental status. Disoriented EXAM: CT HEAD WITHOUT CONTRAST TECHNIQUE: Contiguous axial images were obtained from the base of the skull through the vertex without intravenous contrast. COMPARISON:  None. FINDINGS: Brain: Age related atrophy. No intracranial hemorrhage, mass effect, or midline shift. No hydrocephalus. The basilar cisterns are patent. Moderate periventricular and deep white matter hypodensity consistent with chronic small vessel ischemia. No evidence of territorial infarct or acute ischemia. No extra-axial or intracranial fluid collection. Vascular: Atherosclerosis of skullbase vasculature without hyperdense vessel or abnormal calcification. Skull: No fracture or focal lesion. Sinuses/Orbits: Paranasal sinuses and mastoid air cells are clear. The visualized orbits are unremarkable. Other: There is prominent degenerative pannus at C1-C2 which  causes mild mass effect and narrowing of the cranial cervical junction, only partially included in the field of view. IMPRESSION: 1. No acute intracranial abnormality. 2. Age related atrophy and chronic small vessel ischemia. 3. Prominent degenerative pannus at C1-C2 which causes mild mass effect and narrowing of the cranial cervical junction, only partially included in the field of view. Electronically Signed   By: Narda Rutherford M.D.   On: 10/16/2020 18:22   CT Chest Wo Contrast  Result Date: 10/16/2020 CLINICAL DATA:  Cough and leukocytosis. EXAM: CT CHEST WITHOUT CONTRAST TECHNIQUE: Multidetector CT imaging of the chest was performed following the standard protocol without IV contrast. COMPARISON:  Radiograph earlier today.  Chest CTA 12/28/2019 FINDINGS: Cardiovascular: Aortic atherosclerosis and tortuosity. No aortic aneurysm. No periaortic stranding. Mild dilatation of the central pulmonary artery at 3.6 cm. Cardiomegaly with advanced coronary artery calcifications. Presumed planted metallic densities in the left ventricle. No significant pericardial effusion. Small amount of fluid in the superior pericardial recess. Mediastinum/Nodes: No enlarged mediastinal lymph nodes. Limited assessment for hilar adenopathy in this unenhanced exam. Tiny hiatal hernia. No suspicious thyroid nodule. Lungs/Pleura: No consolidation to suggest pneumonia. Dependent atelectasis in the right greater than left lower lobe. No septal thickening or pulmonary edema. No pleural fluid. No mass or suspicious nodule. Trachea and central bronchi are patent. Upper Abdomen: Low-density lesion in the right lobe of the liver is similar to prior exam, incompletely characterized on this unenhanced exam. Gallstone noted without pericholecystic fat stranding. No acute findings in the upper abdomen. Musculoskeletal: Diffuse thoracic spondylosis and degenerative disc disease. Advanced degenerative change of both shoulders. Similar sclerotic  appearance of the posterior right fifth rib, stable in appearance similar but less pronounced appearance of the left eighth and eleventh ribs. No new osseous findings.  No chest wall soft tissue abnormality. IMPRESSION: 1. No evidence of pneumonia. Dependent atelectasis in the right greater than left lower lobe. 2. Cardiomegaly with advanced coronary artery calcifications. 3. Mild dilatation of the central pulmonary artery, can be seen with pulmonary arterial hypertension. 4. Cholelithiasis incidentally noted in the upper abdomen. 5. Sclerotic cortical thickening of bilateral ribs is unchanged from September of 2021 exam. Findings may be related to Paget's disease, prior injury, or sclerotic metastasis. Overall stability over 10 months. Aortic Atherosclerosis (ICD10-I70.0). Electronically Signed   By: Narda RutherfordMelanie  Sanford M.D.   On: 10/16/2020 18:29   ECHOCARDIOGRAM COMPLETE  Result Date: 10/18/2020    ECHOCARDIOGRAM REPORT   Patient Name:   Clyda GreenerSTEVEN Hornbaker Date of Exam: 10/18/2020 Medical Rec #:  [id number]  Height:       67.0 in Accession #:    [id number] Weight:       227.0 lb Date of Birth:  05/20/1945  BSA:          2.134 m Patient Age:    74 years   BP:           111/83 mmHg Patient Gender: M          HR:           81 bpm. Exam Location:  [facility] Procedure: 2D Echo, Cardiac Doppler and Color Doppler Indications:     Bacteremia R78.81  History:         Patient has no prior history of Echocardiogram examinations.                  COPD; Risk Factors:Diabetes.                   Mitral Valve: valve is present in the mitral position.  Sonographer:     Cristela BlueJerry Hege RDCS (AE) Referring Phys:  [id number] Alford HighlandICHARD WIETING Diagnosing Phys: Debbe OdeaBrian Agbor-Etang MD  Sonographer Comments: Suboptimal apical window. IMPRESSIONS  1. Left ventricular ejection fraction, by estimation, is 55%. The left ventricle has low normal function. The left ventricle has no regional wall motion abnormalities. Left ventricular diastolic parameters are consistent  with Grade II diastolic dysfunction (pseudonormalization).  2. Right ventricular systolic function is normal. The right ventricular size is normal.  3. Left atrial size was severely dilated.  4. Right atrial size was mildly dilated.  5. 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 there is no evidence for vegetation.. The mitral valve has been repaired/replaced. Mild mitral valve regurgitation. The mean mitral valve gradient is 4.0 mmHg. There is a present in the mitral position.  Echo findings are consistent with normal structure and function of the mitral valve prosthesis.  6. The aortic valve is grossly normal. Aortic valve regurgitation is not visualized. Conclusion(s)/Recommendation(s): No evidence of valvular vegetations on this transthoracic echocardiogram. Would recommend a transesophageal echocardiogram to exclude infective endocarditis if clinically indicated. FINDINGS  Left Ventricle: Left ventricular ejection fraction, by estimation, is 55%. The left ventricle has low normal function. The left ventricle has no regional wall motion abnormalities. The left ventricular internal cavity size was normal in size. There is no left ventricular hypertrophy. Left ventricular diastolic parameters are consistent with Grade II diastolic dysfunction (pseudonormalization). Right Ventricle: The right ventricular size is normal. No increase in right ventricular wall thickness. Right ventricular systolic function is normal. Left Atrium: Left atrial size was severely dilated. Right Atrium: Right atrial size was mildly dilated. Pericardium: There is no evidence of pericardial effusion. Mitral Valve: Two Mitra-clips present in  the mitral position. there is no evidence for vegetation. The mitral valve has been repaired/replaced. Mild mitral valve regurgitation. There is a present in the mitral position. Echo findings are consistent with normal structure and function of the mitral valve prosthesis. MV peak gradient, 10.1  mmHg. The mean mitral valve gradient is 4.0 mmHg. Tricuspid Valve: The tricuspid valve is normal in structure. Tricuspid valve regurgitation is trivial. Aortic Valve: The aortic valve is grossly normal. Aortic valve regurgitation is not visualized. Aortic valve mean gradient measures 2.0 mmHg. Aortic valve peak gradient measures 4.1 mmHg. Aortic valve area, by VTI measures 2.76 cm. Pulmonic Valve: The pulmonic valve was not well visualized. Pulmonic valve regurgitation is not visualized. Aorta: The aortic root is normal in size and structure. IAS/Shunts: No atrial level shunt detected by color flow Doppler.  LEFT VENTRICLE PLAX 2D LVIDd:         4.42 cm  Diastology LVIDs:         2.33 cm  LV e' medial:    4.79 cm/s LV PW:         1.01 cm  LV E/e' medial:  29.0 LV IVS:        0.89 cm  LV e' lateral:   7.62 cm/s LVOT diam:     2.10 cm  LV E/e' lateral: 18.2 LV SV:         48 LV SV Index:   23 LVOT Area:     3.46 cm  RIGHT VENTRICLE RV Basal diam:  3.80 cm RV S prime:     11.00 cm/s TAPSE (M-mode): 4.0 cm LEFT ATRIUM            Index       RIGHT ATRIUM           Index LA diam:      5.10 cm  2.39 cm/m  RA Area:     26.10 cm LA Vol (A2C): 117.0 ml 54.82 ml/m RA Volume:   79.30 ml  37.16 ml/m LA Vol (A4C): 105.0 ml 49.20 ml/m  AORTIC VALVE                   PULMONIC VALVE AV Area (Vmax):    2.41 cm    PV Vmax:        0.47 m/s AV Area (Vmean):   2.48 cm    PV Peak grad:   0.9 mmHg AV Area (VTI):     2.76 cm    RVOT Peak grad: 2 mmHg AV Vmax:           101.30 cm/s AV Vmean:          63.850 cm/s AV VTI:            0.176 m AV Peak Grad:      4.1 mmHg AV Mean Grad:      2.0 mmHg LVOT Vmax:         70.50 cm/s LVOT Vmean:        45.800 cm/s LVOT VTI:          0.140 m LVOT/AV VTI ratio: 0.80  AORTA Ao Root diam: 3.50 cm MITRAL VALVE                TRICUSPID VALVE MV Area (PHT): 2.88 cm     TR Peak grad:   29.8 mmHg MV Area VTI:   1.35 cm     TR Vmax:        273.00 cm/s MV Peak grad:  10.1 mmHg MV Mean grad:  4.0 mmHg      SHUNTS MV Vmax:       1.59 m/s     Systemic VTI:  0.14 m MV Vmean:      94.6 cm/s    Systemic Diam: 2.10 cm MV Decel Time: 263 msec MV E velocity: 139.00 cm/s MV A velocity: 118.00 cm/s MV E/A ratio:  1.18 Debbe Odea MD Electronically signed by Debbe Odea MD Signature Date/Time: 10/18/2020/2:01:47 PM    Final      Positive ROS: All other systems have been reviewed and were otherwise negative with the exception of those mentioned in the HPI and as above.  Physical Exam: BP 111/83 (BP Location: Right Arm)   Pulse 81   Temp 97.8 F (36.6 C)   Resp 19   Wt 103 kg   SpO2 100%   BMI 35.55 kg/m  General:  Alert, no acute distress Psychiatric:  Patient is competent for consent with normal mood and affect   Cardiovascular:  No pedal edema, regular rate and rhythm Respiratory:  No wheezing, non-labored breathing GI:  Abdomen is soft and non-tender Skin:  No lesions in the area of chief complaint, no erythema Neurologic:  Sensation intact distally, CN grossly intact Lymphatic:  No axillary or cervical lymphadenopathy  Orthopedic Exam:  RLE: 5/5 DF/PF/EHL SILT s/s/t/sp/dp distr Foot wwp RoM Right knee: 0-90 degrees with no significant increase in pain. There is no significant effusion.  There is no significant warmth to the knee.  There is no erythema about the knee. The patient has mild-moderate medial joint line tenderness   RUE: +ain/pin/u motor SILT r/u/m/ax +rad pulse RoM elbow: 0-120 degrees without any significant pain.  Patient able to supinate and pronate without significant pain.  There is no erythema, effusion, or warmth about the elbow.  There is no significant tenderness to palpation about the elbow.   X-rays:  No radiographs of the right knee or right elbow were obtained.  Assessment/Plan: 75 year old male with bacteremia and right elbow and right knee pain.  Patient states that since admission both right elbow and right knee pain have significantly  improved.  On my exam, there were no significant clinical findings suggestive of septic arthritis.  1.  Recommend medical management with NSAIDs and Tylenol if not medically contraindicated.  2.  PT/OT as able.  3.  The patient can follow-up with as an outpatient at [facility] clinic if elbow and knee pain persist.  4.  We will follow peripherally.  Please page with any further questions.    Signa Kell   10/18/2020 4:32 PM

## 2020-10-18 NOTE — Consult Note (Addendum)
NAME: Raymond Hays  DOB: 1945-12-06  MRN: [id number]  Date/Time: 10/18/2020 12:07 PM  REQUESTING PROVIDER: bacteremia Subjective:  REASON FOR CONSULT: Dr.Wieting ? Raymond Hays is a 75 y.o. male with a history of Afib, HTN, Mitral regurgitation for which he had a transcatheter mitral valve repair with implantation of mitral clips on 06/26/20, DM,  COPD, poor mobility Resident of [facility] brought in by EMS on 10/16/20 with weakness 4 days, worsening dyspnea, vomiting once, poor appetite. Pt has cough,  he has underlying COPD, no chest pain, diarrhea, pain abdomen He has a Supra pubic catheter in place He was having neck pain- He has had cervical surgery and has hardware in place Vitals 119/96, temp 98.3, HR 94 Wbc 18, HB 11.4, plt 196 and cr 1.61 Blood cultures sent. CXR no infiltrate. Negative for Covid He was admitted with sepsis due to resp infection and started on IV ceftriaxone and Azithromycin. Blood culture came back positive for streptococcus species and I am asked to see the patient for same  Pt saw his cardiologist on 10/10/20 and Echo  10/10/20 Echo There is mild to moderate mitral valve thickening. There is mild mitral annular calcification. The mean gradient across the mitral valve is 4.0 mmHg. The heart rate for the mean mitral valve gradient is 81 BPM. There is mild to moderate mitral regurgitation. The mitral regurgitant jet is eccentrically directed. The regurgitant jet is anteriorly-directed. Status post mitraClip procedure.  PMH Afib HTN CVA DM COPD BPH  PSH  Appendectomy Laminectomy l4-L5 Laser vaporization of prostate eith thulium 08/21/17 Neck surgery 2016 Partial wrist fusion 05/2015 Elbow surgery Knee surgery Partial hip arthroplasty b/l Hernia repair Tonsillectomy     Social History   Occupational History   Occupation: retired    Comment: retired Hotel manager x 4 years; Scientist, physiological; Arboriculturist as Academic librarian  Tobacco Use   Smoking status: Former     Packs/day: 0.50    Years: 10.00    Pack years: 5.00    Types: Cigarettes    Quit date: 2006    Years since quitting: 16.5   Smokeless tobacco: Never  Vaping Use   Vaping Use: Never used  Substance and Sexual Activity   Alcohol use: Not Currently   Drug use: Not Currently    Types: "Crack" cocaine    Comment: quit "crack" 15 years ago--11/04/2019   Sexual activity: Not on file  Other Topics Concern   Not on file  Social History Narrative   Not on file   FH Father CAD Sister Hip dysplasia    No Known Allergies I? Current Facility-Administered Medications  Medication Dose Route Frequency Provider Last Rate Last Admin   0.9 %  sodium chloride infusion   Intravenous Continuous Mansy, Jan A, MD 100 mL/hr at 10/17/20 0716 New Bag at 10/17/20 0716   acetaminophen (TYLENOL) tablet 650 mg  650 mg Oral Q6H PRN Mansy, Jan A, MD       Or   acetaminophen (TYLENOL) suppository 650 mg  650 mg Rectal Q6H PRN Mansy, Jan A, MD       apixaban Everlene Balls) tablet 5 mg  5 mg Oral BID Mansy, Jan A, MD   5 mg at 10/18/20 0845   ascorbic acid (VITAMIN C) tablet 500 mg  500 mg Oral Daily Mansy, Jan A, MD   500 mg at 10/18/20 0845   bisacodyl (DULCOLAX) suppository 10 mg  10 mg Rectal PRN Mansy, Vernetta Honey, MD       calcium  carbonate (TUMS - dosed in mg elemental calcium) chewable tablet 400 mg of elemental calcium  2 tablet Oral Q6H PRN Mansy, Jan A, MD       carbidopa-levodopa (SINEMET IR) 25-100 MG per tablet immediate release 1 tablet  1 tablet Oral TID Mansy, Jan A, MD   1 tablet at 10/18/20 0559   cefTRIAXone (ROCEPHIN) 2 g in sodium chloride 0.9 % 100 mL IVPB  2 g Intravenous Q24H Mansy, Jan A, MD 200 mL/hr at 10/17/20 2100 2 g at 10/17/20 2100   Chlorhexidine Gluconate Cloth 2 % PADS 6 each  6 each Topical Daily Mansy, Jan A, MD   6 each at 10/17/20 0900   chlorpheniramine-HYDROcodone (TUSSIONEX) 10-8 MG/5ML suspension 5 mL  5 mL Oral Q12H PRN Mansy, Jan A, MD   5 mL at 10/17/20 0953   escitalopram  (LEXAPRO) tablet 5 mg  5 mg Oral Daily Mansy, Jan A, MD   5 mg at 10/18/20 0844   feeding supplement (ENSURE ENLIVE / ENSURE PLUS) liquid 237 mL  237 mL Oral BID BM Sreenath, Sudheer B, MD       finasteride (PROSCAR) tablet 5 mg  5 mg Oral Daily Mansy, Jan A, MD   5 mg at 10/18/20 0845   fluticasone (FLONASE) 50 MCG/ACT nasal spray 1 spray  1 spray Each Nare Daily Mansy, Jan A, MD   1 spray at 10/17/20 1400   gabapentin (NEURONTIN) capsule 800 mg  800 mg Oral BID Mansy, Jan A, MD   800 mg at 10/18/20 0845   guaiFENesin (MUCINEX) 12 hr tablet 600 mg  600 mg Oral Q12H Mansy, Jan A, MD   600 mg at 10/18/20 0846   hydrOXYzine (ATARAX/VISTARIL) tablet 25 mg  25 mg Oral TID PRN Mansy, Jan A, MD       ipratropium (ATROVENT) 0.03 % nasal spray 1 spray  1 spray Each Nare BID Mansy, Jan A, MD       ipratropium-albuterol (DUONEB) 0.5-2.5 (3) MG/3ML nebulizer solution 3 mL  3 mL Nebulization TID Georgeann Oppenheim, Sudheer B, MD   3 mL at 10/18/20 0719   ketorolac (TORADOL) 15 MG/ML injection 15 mg  15 mg Intravenous Q6H Sreenath, Sudheer B, MD   15 mg at 10/18/20 0559   lactase (LACTAID) tablet 3,000 Units  3,000 Units Oral TID WC Mansy, Jan A, MD   3,000 Units at 10/18/20 0846   loperamide (IMODIUM) capsule 4 mg  4 mg Oral PRN Mansy, Jan A, MD   4 mg at 10/18/20 0845   loratadine (CLARITIN) tablet 10 mg  10 mg Oral Daily Mansy, Jan A, MD   10 mg at 10/18/20 0846   magnesium hydroxide (MILK OF MAGNESIA) suspension 30 mL  30 mL Oral Daily PRN Mansy, Jan A, MD       methocarbamol (ROBAXIN) tablet 1,000 mg  1,000 mg Oral Q8H PRN Mansy, Jan A, MD   1,000 mg at 10/18/20 0845   methylPREDNISolone sodium succinate (SOLU-MEDROL) 40 mg/mL injection 20 mg  20 mg Intravenous Q12H Sreenath, Sudheer B, MD   20 mg at 10/18/20 0844   metoprolol succinate (TOPROL-XL) 24 hr tablet 50 mg  50 mg Oral Daily Mansy, Jan A, MD   50 mg at 10/18/20 0845   montelukast (SINGULAIR) tablet 10 mg  10 mg Oral QHS Mansy, Jan A, MD   10 mg at 10/17/20  2200   morphine 2 MG/ML injection 2 mg  2 mg Intravenous Q2H PRN Mansy, Jan A,  MD   2 mg at 10/17/20 0814   ondansetron (ZOFRAN) tablet 4 mg  4 mg Oral Q6H PRN Mansy, Jan A, MD       Or   ondansetron [facility]) injection 4 mg  4 mg Intravenous Q6H PRN Mansy, Jan A, MD       senna-docusate (Senokot-S) tablet 1 tablet  1 tablet Oral QHS PRN Mansy, Jan A, MD       tamsulosin [facility]) capsule 0.4 mg  0.4 mg Oral Daily Mansy, Jan A, MD   0.4 mg at 10/17/20 2440   traMADol (ULTRAM) tablet 100 mg  100 mg Oral TID PRN Lolita Patella B, MD   100 mg at 10/18/20 0846   traZODone (DESYREL) tablet 100 mg  100 mg Oral QHS Mansy, Jan A, MD   100 mg at 10/17/20 2200     Abtx:  Anti-infectives (From admission, onward)    Start     Dose/Rate Route Frequency Ordered Stop   10/17/20 2000  cefTRIAXone (ROCEPHIN) 2 g in sodium chloride 0.9 % 100 mL IVPB        2 g 200 mL/hr over 30 Minutes Intravenous Every 24 hours 10/16/20 2006     10/17/20 2000  azithromycin (ZITHROMAX) 500 mg in sodium chloride 0.9 % 250 mL IVPB  Status:  Discontinued        500 mg 250 mL/hr over 60 Minutes Intravenous Every 24 hours 10/16/20 2006 10/17/20 0908   10/16/20 1930  cefTRIAXone (ROCEPHIN) 1 g in sodium chloride 0.9 % 100 mL IVPB        1 g 200 mL/hr over 30 Minutes Intravenous  Once 10/16/20 1926 10/16/20 2130   10/16/20 1930  azithromycin (ZITHROMAX) 500 mg in sodium chloride 0.9 % 250 mL IVPB        500 mg 250 mL/hr over 60 Minutes Intravenous  Once 10/16/20 1926 10/16/20 2202       REVIEW OF SYSTEMS:  Const: negative fever, negative chills, negative weight loss Eyes: negative diplopia or visual changes, negative eye pain ENT: negative coryza, negative sore throat Resp:  cough, no hemoptysis, ++ dyspnea on minimal exertion Cards: negative for chest pain, palpitations, has lower extremity edema GU: negative for frequency, dysuria and hematuria GI: Negative for abdominal pain, diarrhea, bleeding, constipation Skin:  pruritus, excoriations over hands Heme: no bruising MS: generalized weakness Poor mobility- used to walk with walker but not anymore Neurolo:negative for headaches, dizziness, vertigo, some memory problems  Psych: negative for feelings of anxiety, depression  Endocrine: diabetes Allergy/Immunology- negative for any medication or food allergies ?  Objective:  VITALS:  BP 111/83 (BP Location: Right Arm)   Pulse 81   Temp 97.8 F (36.6 C)   Resp 19   Wt 103 kg   SpO2 100%   BMI 35.55 kg/m  PHYSICAL EXAM:  General: Alert, cooperative, no distress, increased BMI Head: Normocephalic, without obvious abnormality, atraumatic. Eyes: Conjunctivae clear, anicteric sclerae. Pupils are equal ENT Nares normal. No drainage or sinus tenderness. Lips, mucosa, and tongue normal. No Thrush Poor dentition Neck:  symmetrical, no adenopathy, thyroid: non tender no carotid bruit and no JVD. Back: No CVA tenderness. Lungs: b/l air entry Heart: irregular, systolic murmur Abdomen: Soft, non-tender,not distended. Bowel sounds normal. No masses, SPC Extremities: edema feet Skin: excoriations over hands Lymph: Cervical, supraclavicular normal. Neurologic: Grossly non-focal Pertinent Labs Lab Results CBC    Component Value Date/Time   WBC 18.0 (H) 10/16/2020 1536   RBC 4.03 (L) 10/16/2020 1536  HGB 11.4 (L) 10/16/2020 1536   HCT 35.0 (L) 10/16/2020 1536   PLT 196 10/16/2020 1536   MCV 86.8 10/16/2020 1536   MCH 28.3 10/16/2020 1536   MCHC 32.6 10/16/2020 1536   RDW 15.9 (H) 10/16/2020 1536   LYMPHSABS 0.6 (L) 10/16/2020 1536   MONOABS 0.8 10/16/2020 1536   EOSABS 0.1 10/16/2020 1536   BASOSABS 0.1 10/16/2020 1536    CMP Latest Ref Rng & Units 10/17/2020 10/16/2020 12/15/2019  Glucose 70 - 99 mg/dL 161(W174(H) 960(A153(H) 540(J133(H)  BUN 8 - 23 mg/dL 81(X36(H) 91(Y38(H) 78(G38(H)  Creatinine 0.61 - 1.24 mg/dL 9.561.23 2.13(Y1.61(H) 8.65(H1.43(H)  Sodium 135 - 145 mmol/L 137 137 135  Potassium 3.5 - 5.1 mmol/L 4.1 4.7 5.3(H)   Chloride 98 - 111 mmol/L 104 102 101  CO2 22 - 32 mmol/L 23 24 25   Calcium 8.9 - 10.3 mg/dL 8.2(L) 8.8(L) 9.2  Total Protein 6.5 - 8.1 g/dL 6.3(L) 7.1 7.7  Total Bilirubin 0.3 - 1.2 mg/dL 0.8 1.2 0.7  Alkaline Phos 38 - 126 U/L 57 72 94  AST 15 - 41 U/L 9(L) 14(L) 17  ALT 0 - 44 U/L 10 6 7       Microbiology: Recent Results (from the past 240 hour(s))  Resp Panel by RT-PCR (Flu A&B, Covid) Nasopharyngeal Swab     Status: None   Collection Time: 10/16/20  3:36 PM   Specimen: Nasopharyngeal Swab; Nasopharyngeal(NP) swabs in vial transport medium  Result Value Ref Range Status   SARS Coronavirus 2 by RT PCR NEGATIVE NEGATIVE Final    Comment: (NOTE) SARS-CoV-2 target nucleic acids are NOT DETECTED.  The SARS-CoV-2 RNA is generally detectable in upper respiratory specimens during the acute phase of infection. The lowest concentration of SARS-CoV-2 viral copies this assay can detect is 138 copies/mL. A negative result does not preclude SARS-Cov-2 infection and should not be used as the sole basis for treatment or other patient management decisions. A negative result may occur with  improper specimen collection/handling, submission of specimen other than nasopharyngeal swab, presence of viral mutation(s) within the areas targeted by this assay, and inadequate number of viral copies(<138 copies/mL). A negative result must be combined with clinical observations, patient history, and epidemiological information. The expected result is Negative.  Fact Sheet for Patients:  BloggerCourse.comhttps://www.fda.gov/media/152166/download  Fact Sheet for Healthcare Providers:  SeriousBroker.ithttps://www.fda.gov/media/152162/download  This test is no t yet approved or cleared by the [place] FDA and  has been authorized for detection and/or diagnosis of SARS-CoV-2 by FDA under an Emergency Use Authorization (EUA). This EUA will remain  in effect (meaning this test can be used) for the duration of the COVID-19  declaration under Section 564(b)(1) of the Act, 21 U.S.C.section 360bbb-3(b)(1), unless the authorization is terminated  or revoked sooner.       Influenza A by PCR NEGATIVE NEGATIVE Final   Influenza B by PCR NEGATIVE NEGATIVE Final    Comment: (NOTE) The Xpert Xpress SARS-CoV-2/FLU/RSV plus assay is intended as an aid in the diagnosis of influenza from Nasopharyngeal swab specimens and should not be used as a sole basis for treatment. Nasal washings and aspirates are unacceptable for Xpert Xpress SARS-CoV-2/FLU/RSV testing.  Fact Sheet for Patients: BloggerCourse.comhttps://www.fda.gov/media/152166/download  Fact Sheet for Healthcare Providers: SeriousBroker.ithttps://www.fda.gov/media/152162/download  This test is not yet approved or cleared by the [place] FDA and has been authorized for detection and/or diagnosis of SARS-CoV-2 by FDA under an Emergency Use Authorization (EUA). This EUA will remain in effect (meaning this test can be  used) for the duration of the COVID-19 declaration under Section 564(b)(1) of the Act, 21 U.S.C. section 360bbb-3(b)(1), unless the authorization is terminated or revoked.  Performed at [facility], [address] [place], [place] [address]   Blood culture (routine x 2)     Status: None (Preliminary result)   Collection Time: 10/16/20  7:26 PM   Specimen: BLOOD LEFT FOREARM  Result Value Ref Range Status   Specimen Description   Final    BLOOD LEFT FOREARM Performed at [facility], [address] [place], [place] [address]    Special Requests   Final    BOTTLES DRAWN AEROBIC AND ANAEROBIC Blood Culture adequate volume Performed at [facility], [address], [place], [place] [address]    Culture  Setup Time   Final    GRAM POSITIVE COCCI IN BOTH AEROBIC AND ANAEROBIC BOTTLES CRITICAL RESULT CALLED TO, READ BACK BY AND VERIFIED WITH: KRISTEN MERRIL, PHARMD AT 0830 ON 10/17/20 BY GM Performed at [facility], [address] [place], [place] [address]    Culture GRAM POSITIVE COCCI  Final   Report Status PENDING  Incomplete  Blood Culture ID Panel (Reflexed)     Status: Abnormal   Collection Time: 10/16/20  7:26 PM  Result Value Ref Range Status   Enterococcus faecalis NOT DETECTED NOT DETECTED Final   Enterococcus Faecium NOT DETECTED NOT DETECTED Final   Listeria monocytogenes NOT DETECTED NOT DETECTED Final   Staphylococcus species NOT DETECTED NOT DETECTED Final   Staphylococcus aureus (BCID) NOT DETECTED NOT DETECTED Final   Staphylococcus epidermidis NOT DETECTED NOT DETECTED Final   Staphylococcus lugdunensis NOT DETECTED NOT DETECTED Final   Streptococcus species DETECTED (A) NOT DETECTED Final    Comment: Not Enterococcus species, Streptococcus agalactiae, Streptococcus pyogenes, or Streptococcus pneumoniae. CRITICAL RESULT CALLED TO, READ BACK BY AND VERIFIED WITH: KRISTEN MERRIL, PHARMD AT 0830 ON 10/17/20 BY GM    Streptococcus agalactiae NOT DETECTED NOT DETECTED Final   Streptococcus pneumoniae NOT DETECTED NOT DETECTED Final   Streptococcus pyogenes NOT DETECTED NOT DETECTED Final   A.calcoaceticus-baumannii NOT DETECTED NOT DETECTED Final   Bacteroides fragilis NOT DETECTED NOT DETECTED Final   Enterobacterales NOT DETECTED NOT DETECTED Final   Enterobacter cloacae complex NOT DETECTED NOT DETECTED Final   Escherichia coli NOT DETECTED NOT DETECTED Final   Klebsiella aerogenes NOT DETECTED NOT DETECTED Final   Klebsiella oxytoca NOT DETECTED NOT DETECTED Final   Klebsiella pneumoniae NOT DETECTED NOT DETECTED Final   Proteus species NOT DETECTED NOT DETECTED Final   Salmonella species NOT DETECTED NOT DETECTED Final   Serratia marcescens NOT DETECTED NOT DETECTED Final   Haemophilus influenzae NOT DETECTED NOT DETECTED Final   Neisseria meningitidis NOT DETECTED NOT DETECTED Final   Pseudomonas aeruginosa NOT DETECTED NOT DETECTED Final   Stenotrophomonas maltophilia NOT DETECTED NOT  DETECTED Final   Candida albicans NOT DETECTED NOT DETECTED Final   Candida auris NOT DETECTED NOT DETECTED Final   Candida glabrata NOT DETECTED NOT DETECTED Final   Candida krusei NOT DETECTED NOT DETECTED Final   Candida parapsilosis NOT DETECTED NOT DETECTED Final   Candida tropicalis NOT DETECTED NOT DETECTED Final   Cryptococcus neoformans/gattii NOT DETECTED NOT DETECTED Final    Comment: Performed at [facility], [address] [place], [place] [address]  Blood culture (routine x 2)     Status: None (Preliminary result)   Collection Time: 10/16/20 11:23 PM   Specimen: BLOOD  RIGHT FOREARM  Result Value Ref Range Status   Specimen Description BLOOD RIGHT FOREARM  Final   Special Requests   Final    BOTTLES DRAWN AEROBIC AND ANAEROBIC Blood Culture adequate volume   Culture   Final    NO GROWTH 2 DAYS Performed at [facility], [address] [place], [place] [address]    Report Status PENDING  Incomplete    IMAGING RESULTS:  I have personally reviewed the films ?Low lung volumes.  Mild cardiomegaly with slight central congestion  Impression/Recommendation ?75 yr male with multiple comorbidities  ?Streptococcus bacteremia :  pt has mitral valve repair with clips , hence endocarditis will have to be ruled out Also has cervical spine hardware and c/o increasing pain Need to rule out infection- Ct scan done today Continue ceftriaxone He needs TEE Will repeat blood cultures  Rt shoulder pain- has underlying rotator cuff injury- will keep an eye for septic arthritis  Mitral regurgitation/CHF needing mitral valve repair with clips  Afib- on eliquis, metoprolol- controlled  BPH - has SPC - on finasteride and tamsulosin  Decreased mobility due to  DJD/spine surgery and cardiac condition  AKI on ckd- improving  DM   COPD- on inhalers- also on solumedrol-  ? On parkinson meds _ Complicated medical diagnosis with high level  MDM __________________________________________________ Discussed with patient, requesting provider Note:  This document was prepared using Dragon voice recognition software and may include unintentional dictation errors.

## 2020-10-18 NOTE — Progress Notes (Signed)
Patient ID: Raymond Hays Prioleau, male   DOB: 03/23/1946, 75 y.o.   MRN: [id number] [facility] PROGRESS NOTE  Raymond Hays Cavallero NWG:[id number] DOB: 09/22/1945 DOA: 10/16/2020 PCP: [facility]  HPI/Subjective: Patient came in with knee pain and neck pain and right elbow pain.  It seems to be getting better since coming into the hospital.  Also had a lot of itching prior to coming into the hospital which has disappeared.  Blood cultures positive for strep species.  Objective: Vitals:   10/18/20 0535 10/18/20 0800  BP: 120/84 111/83  Pulse: 79 81  Resp: 18 19  Temp: (!) 97.4 F (36.3 C) 97.8 F (36.6 C)  SpO2: 99% 100%    Intake/Output Summary (Last 24 hours) at 10/18/2020 1428 Last data filed at 10/18/2020 0539 Gross per 24 hour  Intake --  Output 500 ml  Net -500 ml   Filed Weights   10/16/20 1534  Weight: 103 kg    ROS: Review of Systems  Respiratory:  Negative for shortness of breath.   Cardiovascular:  Negative for chest pain.  Gastrointestinal:  Negative for abdominal pain, nausea and vomiting.  Musculoskeletal:  Positive for joint pain and neck pain.  Exam: Physical Exam HENT:     Head: Normocephalic.     Mouth/Throat:     Pharynx: No oropharyngeal exudate.  Eyes:     General: Lids are normal.     Conjunctiva/sclera: Conjunctivae normal.     Pupils: Pupils are equal, round, and reactive to light.  Cardiovascular:     Rate and Rhythm: Normal rate and regular rhythm.     Heart sounds: Normal heart sounds, S1 normal and S2 normal.  Pulmonary:     Breath sounds: Examination of the right-lower field reveals decreased breath sounds. Examination of the left-lower field reveals decreased breath sounds. Decreased breath sounds present. No wheezing, rhonchi or rales.  Abdominal:     Palpations: Abdomen is soft.     Tenderness: There is no abdominal tenderness.  Musculoskeletal:     Cervical back: Decreased range of motion.     Right knee: Swelling present. Decreased range  of motion.     Right ankle: Swelling present.     Left ankle: Swelling present.  Skin:    General: Skin is warm.     Comments: Healed scars on where he scratched previously but no active ulcerations.  Neurological:     Mental Status: He is alert and oriented to person, place, and time.     Data Reviewed: Basic Metabolic Panel: Recent Labs  Lab 10/16/20 1536 10/17/20 0612  NA 137 137  K 4.7 4.1  CL 102 104  CO2 24 23  GLUCOSE 153* 174*  BUN 38* 36*  CREATININE 1.61* 1.23  CALCIUM 8.8* 8.2*  MG 1.9  --    Liver Function Tests: Recent Labs  Lab 10/16/20 1536 10/17/20 0612  AST 14* 9*  ALT 6 10  ALKPHOS 72 57  BILITOT 1.2 0.8  PROT 7.1 6.3*  ALBUMIN 3.2* 2.7*   CBC: Recent Labs  Lab 10/16/20 1536  WBC 18.0*  NEUTROABS 16.3*  HGB 11.4*  HCT 35.0*  MCV 86.8  PLT 196    BNP (last 3 results) Recent Labs    12/27/19 1605  BNP 102.6*     Recent Results (from the past 240 hour(s))  Resp Panel by RT-PCR (Flu A&B, Covid) Nasopharyngeal Swab     Status: None   Collection Time: 10/16/20  3:36 PM   Specimen: Nasopharyngeal  Swab; Nasopharyngeal(NP) swabs in vial transport medium  Result Value Ref Range Status   SARS Coronavirus 2 by RT PCR NEGATIVE NEGATIVE Final    Comment: (NOTE) SARS-CoV-2 target nucleic acids are NOT DETECTED.  The SARS-CoV-2 RNA is generally detectable in upper respiratory specimens during the acute phase of infection. The lowest concentration of SARS-CoV-2 viral copies this assay can detect is 138 copies/mL. A negative result does not preclude SARS-Cov-2 infection and should not be used as the sole basis for treatment or other patient management decisions. A negative result may occur with  improper specimen collection/handling, submission of specimen other than nasopharyngeal swab, presence of viral mutation(s) within the areas targeted by this assay, and inadequate number of viral copies(<138 copies/mL). A negative result must be  combined with clinical observations, patient history, and epidemiological information. The expected result is Negative.  Fact Sheet for Patients:  BloggerCourse.com  Fact Sheet for Healthcare Providers:  SeriousBroker.it  This test is no t yet approved or cleared by the [place] FDA and  has been authorized for detection and/or diagnosis of SARS-CoV-2 by FDA under an Emergency Use Authorization (EUA). This EUA will remain  in effect (meaning this test can be used) for the duration of the COVID-19 declaration under Section 564(b)(1) of the Act, 21 U.S.C.section 360bbb-3(b)(1), unless the authorization is terminated  or revoked sooner.       Influenza A by PCR NEGATIVE NEGATIVE Final   Influenza B by PCR NEGATIVE NEGATIVE Final    Comment: (NOTE) The Xpert Xpress SARS-CoV-2/FLU/RSV plus assay is intended as an aid in the diagnosis of influenza from Nasopharyngeal swab specimens and should not be used as a sole basis for treatment. Nasal washings and aspirates are unacceptable for Xpert Xpress SARS-CoV-2/FLU/RSV testing.  Fact Sheet for Patients: BloggerCourse.com  Fact Sheet for Healthcare Providers: SeriousBroker.it  This test is not yet approved or cleared by the [place] FDA and has been authorized for detection and/or diagnosis of SARS-CoV-2 by FDA under an Emergency Use Authorization (EUA). This EUA will remain in effect (meaning this test can be used) for the duration of the COVID-19 declaration under Section 564(b)(1) of the Act, 21 U.S.C. section 360bbb-3(b)(1), unless the authorization is terminated or revoked.  Performed at [facility], [address] [place], [place] [address]   Blood culture (routine x 2)     Status: None (Preliminary result)   Collection Time: 10/16/20  7:26 PM   Specimen: BLOOD LEFT FOREARM  Result Value Ref Range Status    Specimen Description   Final    BLOOD LEFT FOREARM Performed at [facility], [address] [place], [place] [address]    Special Requests   Final    BOTTLES DRAWN AEROBIC AND ANAEROBIC Blood Culture adequate volume Performed at [facility], [address], [place], [place] [address]    Culture  Setup Time   Final    GRAM POSITIVE COCCI IN BOTH AEROBIC AND ANAEROBIC BOTTLES CRITICAL RESULT CALLED TO, READ BACK BY AND VERIFIED WITH: KRISTEN MERRIL, PHARMD AT 0830 ON 10/17/20 BY GM Performed at [facility], [address], [place], [place] [address]    Culture [facility] [address] POSITIVE COCCI  Final   Report Status PENDING  Incomplete  Blood Culture ID Panel (Reflexed)     Status: Abnormal   Collection Time: 10/16/20  7:26 PM  Result Value Ref Range Status   Enterococcus faecalis NOT DETECTED NOT DETECTED Final   Enterococcus Faecium NOT DETECTED  NOT DETECTED Final   Listeria monocytogenes NOT DETECTED NOT DETECTED Final   Staphylococcus species NOT DETECTED NOT DETECTED Final   Staphylococcus aureus (BCID) NOT DETECTED NOT DETECTED Final   Staphylococcus epidermidis NOT DETECTED NOT DETECTED Final   Staphylococcus lugdunensis NOT DETECTED NOT DETECTED Final   Streptococcus species DETECTED (A) NOT DETECTED Final    Comment: Not Enterococcus species, Streptococcus agalactiae, Streptococcus pyogenes, or Streptococcus pneumoniae. CRITICAL RESULT CALLED TO, READ BACK BY AND VERIFIED WITH: KRISTEN MERRIL, PHARMD AT 0830 ON 10/17/20 BY GM    Streptococcus agalactiae NOT DETECTED NOT DETECTED Final   Streptococcus pneumoniae NOT DETECTED NOT DETECTED Final   Streptococcus pyogenes NOT DETECTED NOT DETECTED Final   A.calcoaceticus-baumannii NOT DETECTED NOT DETECTED Final   Bacteroides fragilis NOT DETECTED NOT DETECTED Final   Enterobacterales NOT DETECTED NOT DETECTED Final   Enterobacter cloacae complex NOT DETECTED NOT DETECTED Final   Escherichia coli NOT DETECTED  NOT DETECTED Final   Klebsiella aerogenes NOT DETECTED NOT DETECTED Final   Klebsiella oxytoca NOT DETECTED NOT DETECTED Final   Klebsiella pneumoniae NOT DETECTED NOT DETECTED Final   Proteus species NOT DETECTED NOT DETECTED Final   Salmonella species NOT DETECTED NOT DETECTED Final   Serratia marcescens NOT DETECTED NOT DETECTED Final   Haemophilus influenzae NOT DETECTED NOT DETECTED Final   Neisseria meningitidis NOT DETECTED NOT DETECTED Final   Pseudomonas aeruginosa NOT DETECTED NOT DETECTED Final   Stenotrophomonas maltophilia NOT DETECTED NOT DETECTED Final   Candida albicans NOT DETECTED NOT DETECTED Final   Candida auris NOT DETECTED NOT DETECTED Final   Candida glabrata NOT DETECTED NOT DETECTED Final   Candida krusei NOT DETECTED NOT DETECTED Final   Candida parapsilosis NOT DETECTED NOT DETECTED Final   Candida tropicalis NOT DETECTED NOT DETECTED Final   Cryptococcus neoformans/gattii NOT DETECTED NOT DETECTED Final    Comment: Performed at [facility], [address] [place], [place] [address]  Blood culture (routine x 2)     Status: None (Preliminary result)   Collection Time: 10/16/20 11:23 PM   Specimen: BLOOD RIGHT FOREARM  Result Value Ref Range Status   Specimen Description BLOOD RIGHT FOREARM  Final   Special Requests   Final    BOTTLES DRAWN AEROBIC AND ANAEROBIC Blood Culture adequate volume   Culture   Final    NO GROWTH 2 DAYS Performed at [facility], [address] [place], [place] [address]    Report Status PENDING  Incomplete     Studies: DG Cervical Spine 2 or 3 views  Result Date: 10/17/2020 CLINICAL DATA:  Neck pain. EXAM: CERVICAL SPINE - 2-3 VIEW COMPARISON:  No prior. FINDINGS: Very limited exam due to positioning and motion artifact. Postsurgical changes cervical spine consistent prior anterior posterior fusion and mid cervical spine corpectomy. Hardware intact. Anatomic alignment. Severe osteopenia and degenerative  change cervical spine. No acute bony abnormality identified. Probable carotid vascular calcification. IMPRESSION: 1. Very limited exam due to positioning and motion artifact. Postsurgical changes cervical spine. Hardware intact. Severe osteopenia and degenerative change cervical spine. 2.  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 Electronically Signed   By: Maisie Fus  Register   On: 10/17/2020 11:33   CT Head Wo Contrast  Result Date: 10/16/2020 CLINICAL DATA:  Weakness for 4 days. Altered mental status. Disoriented EXAM: CT HEAD WITHOUT CONTRAST TECHNIQUE: Contiguous axial images were obtained from the base of the skull through the vertex without intravenous contrast. COMPARISON:  None. FINDINGS: Brain: Age related atrophy. No intracranial  hemorrhage, mass effect, or midline shift. No hydrocephalus. The basilar cisterns are patent. Moderate periventricular and deep white matter hypodensity consistent with chronic small vessel ischemia. No evidence of territorial infarct or acute ischemia. No extra-axial or intracranial fluid collection. Vascular: Atherosclerosis of skullbase vasculature without hyperdense vessel or abnormal calcification. Skull: No fracture or focal lesion. Sinuses/Orbits: Paranasal sinuses and mastoid air cells are clear. The visualized orbits are unremarkable. Other: There is prominent degenerative pannus at C1-C2 which causes mild mass effect and narrowing of the cranial cervical junction, only partially included in the field of view. IMPRESSION: 1. No acute intracranial abnormality. 2. Age related atrophy and chronic small vessel ischemia. 3. Prominent degenerative pannus at C1-C2 which causes mild mass effect and narrowing of the cranial cervical junction, only partially included in the field of view. Electronically Signed   By: Narda Rutherford M.D.   On: 10/16/2020 18:22   CT Chest Wo Contrast  Result Date: 10/16/2020 CLINICAL DATA:  Cough and leukocytosis. EXAM: CT CHEST WITHOUT CONTRAST  TECHNIQUE: Multidetector CT imaging of the chest was performed following the standard protocol without IV contrast. COMPARISON:  Radiograph earlier today.  Chest CTA 12/28/2019 FINDINGS: Cardiovascular: Aortic atherosclerosis and tortuosity. No aortic aneurysm. No periaortic stranding. Mild dilatation of the central pulmonary artery at 3.6 cm. Cardiomegaly with advanced coronary artery calcifications. Presumed planted metallic densities in the left ventricle. No significant pericardial effusion. Small amount of fluid in the superior pericardial recess. Mediastinum/Nodes: No enlarged mediastinal lymph nodes. Limited assessment for hilar adenopathy in this unenhanced exam. Tiny hiatal hernia. No suspicious thyroid nodule. Lungs/Pleura: No consolidation to suggest pneumonia. Dependent atelectasis in the right greater than left lower lobe. No septal thickening or pulmonary edema. No pleural fluid. No mass or suspicious nodule. Trachea and central bronchi are patent. Upper Abdomen: Low-density lesion in the right lobe of the liver is similar to prior exam, incompletely characterized on this unenhanced exam. Gallstone noted without pericholecystic fat stranding. No acute findings in the upper abdomen. Musculoskeletal: Diffuse thoracic spondylosis and degenerative disc disease. Advanced degenerative change of both shoulders. Similar sclerotic appearance of the posterior right fifth rib, stable in appearance similar but less pronounced appearance of the left eighth and eleventh ribs. No new osseous findings. No chest wall soft tissue abnormality. IMPRESSION: 1. No evidence of pneumonia. Dependent atelectasis in the right greater than left lower lobe. 2. Cardiomegaly with advanced coronary artery calcifications. 3. Mild dilatation of the central pulmonary artery, can be seen with pulmonary arterial hypertension. 4. Cholelithiasis incidentally noted in the upper abdomen. 5. Sclerotic cortical thickening of bilateral ribs is  unchanged from September of 2021 exam. Findings may be related to Paget's disease, prior injury, or sclerotic metastasis. Overall stability over 10 months. Aortic Atherosclerosis (ICD10-I70.0). Electronically Signed   By: Narda Rutherford M.D.   On: 10/16/2020 18:29   DG Chest Portable 1 View  Result Date: 10/16/2020 CLINICAL DATA:  Progressive weakness EXAM: PORTABLE CHEST 1 VIEW COMPARISON:  12/16/2019 FINDINGS: Low lung volumes with central bronchovascular crowding. Mild cardiomegaly with probable slight central congestion. Aortic atherosclerosis. No pneumothorax. IMPRESSION: Low lung volumes.  Mild cardiomegaly with slight central congestion Electronically Signed   By: Jasmine Pang M.D.   On: 10/16/2020 16:10   ECHOCARDIOGRAM COMPLETE  Result Date: 10/18/2020    ECHOCARDIOGRAM REPORT   Patient Name:   KAIAN FAHS Date of Exam: 10/18/2020 Medical Rec #:  [id number]  Height:       67.0 in Accession #:    [id number]  Weight:       227.0 lb Date of Birth:  [birth year]  BSA:          2.134 m Patient Age:    74 years   BP:           111/83 mmHg Patient Gender: M          HR:           81 bpm. Exam Location:  [facility] Procedure: 2D Echo, Cardiac Doppler and Color Doppler Indications:     Bacteremia R78.81  History:         Patient has no prior history of Echocardiogram examinations.                  COPD; Risk Factors:Diabetes.                   Mitral Valve: valve is present in the mitral position.  Sonographer:     [name] RDCS (AE) Referring Phys:  [id number] Alford Highland Diagnosing Phys: Debbe Odea MD  Sonographer Comments: Suboptimal apical window. IMPRESSIONS  1. Left ventricular ejection fraction, by estimation, is 55%. The left ventricle has low normal function. The left ventricle has no regional wall motion abnormalities. Left ventricular diastolic parameters are consistent with Grade II diastolic dysfunction (pseudonormalization).  2. Right ventricular systolic function is normal. The right  ventricular size is normal.  3. Left atrial size was severely dilated.  4. Right atrial size was mildly dilated.  5. 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 there is no evidence for vegetation.. The mitral valve has been repaired/replaced. Mild mitral valve regurgitation. The mean mitral valve gradient is 4.0 mmHg. There is a present in the mitral position.  Echo findings are consistent with normal structure and function of the mitral valve prosthesis.  6. The aortic valve is grossly normal. Aortic valve regurgitation is not visualized. Conclusion(s)/Recommendation(s): No evidence of valvular vegetations on this transthoracic echocardiogram. Would recommend a transesophageal echocardiogram to exclude infective endocarditis if clinically indicated. FINDINGS  Left Ventricle: Left ventricular ejection fraction, by estimation, is 55%. The left ventricle has low normal function. The left ventricle has no regional wall motion abnormalities. The left ventricular internal cavity size was normal in size. There is no left ventricular hypertrophy. Left ventricular diastolic parameters are consistent with Grade II diastolic dysfunction (pseudonormalization). Right Ventricle: The right ventricular size is normal. No increase in right ventricular wall thickness. Right ventricular systolic function is normal. Left Atrium: Left atrial size was severely dilated. Right Atrium: Right atrial size was mildly dilated. Pericardium: There is no evidence of pericardial effusion. 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 there is no evidence for vegetation. The mitral valve has been repaired/replaced. Mild mitral valve regurgitation. There is a present in the mitral position. Echo findings are consistent with normal structure and function of the mitral valve prosthesis. MV peak gradient, 10.1 mmHg. The mean mitral valve gradient is 4.0 mmHg. Tricuspid Valve: The tricuspid valve is normal in structure. Tricuspid  valve regurgitation is trivial. Aortic Valve: The aortic valve is grossly normal. Aortic valve regurgitation is not visualized. Aortic valve mean gradient measures 2.0 mmHg. Aortic valve peak gradient measures 4.1 mmHg. Aortic valve area, by VTI measures 2.76 cm. Pulmonic Valve: The pulmonic valve was not well visualized. Pulmonic valve regurgitation is not visualized. Aorta: The aortic root is normal in size and structure. IAS/Shunts: No atrial level shunt detected by color flow Doppler.  LEFT VENTRICLE PLAX 2D LVIDd:  4.42 cm  Diastology LVIDs:         2.33 cm  LV e' medial:    4.79 cm/s LV PW:         1.01 cm  LV E/e' medial:  29.0 LV IVS:        0.89 cm  LV e' lateral:   7.62 cm/s LVOT diam:     2.10 cm  LV E/e' lateral: 18.2 LV SV:         48 LV SV Index:   23 LVOT Area:     3.46 cm  RIGHT VENTRICLE RV Basal diam:  3.80 cm RV S prime:     11.00 cm/s TAPSE (M-mode): 4.0 cm LEFT ATRIUM            Index       RIGHT ATRIUM           Index LA diam:      5.10 cm  2.39 cm/m  RA Area:     26.10 cm LA Vol (A2C): 117.0 ml 54.82 ml/m RA Volume:   79.30 ml  37.16 ml/m LA Vol (A4C): 105.0 ml 49.20 ml/m  AORTIC VALVE                   PULMONIC VALVE AV Area (Vmax):    2.41 cm    PV Vmax:        0.47 m/s AV Area (Vmean):   2.48 cm    PV Peak grad:   0.9 mmHg AV Area (VTI):     2.76 cm    RVOT Peak grad: 2 mmHg AV Vmax:           101.30 cm/s AV Vmean:          63.850 cm/s AV VTI:            0.176 m AV Peak Grad:      4.1 mmHg AV Mean Grad:      2.0 mmHg LVOT Vmax:         70.50 cm/s LVOT Vmean:        45.800 cm/s LVOT VTI:          0.140 m LVOT/AV VTI ratio: 0.80  AORTA Ao Root diam: 3.50 cm MITRAL VALVE                TRICUSPID VALVE MV Area (PHT): 2.88 cm     TR Peak grad:   29.8 mmHg MV Area VTI:   1.35 cm     TR Vmax:        273.00 cm/s MV Peak grad:  10.1 mmHg MV Mean grad:  4.0 mmHg     SHUNTS MV Vmax:       1.59 m/s     Systemic VTI:  0.14 m MV Vmean:      94.6 cm/s    Systemic Diam: 2.10 cm MV Decel  Time: 263 msec MV E velocity: 139.00 cm/s MV A velocity: 118.00 cm/s MV E/A ratio:  1.18 Debbe Odea MD Electronically signed by Debbe Odea MD Signature Date/Time: 10/18/2020/2:01:47 PM    Final     Scheduled Meds:  apixaban  5 mg Oral BID   ascorbic acid  500 mg Oral Daily   carbidopa-levodopa  1 tablet Oral TID   Chlorhexidine Gluconate Cloth  6 each Topical Daily   escitalopram  5 mg Oral Daily   feeding supplement  237 mL Oral BID BM   finasteride  5 mg Oral Daily  fluticasone  1 spray Each Nare Daily   gabapentin  800 mg Oral BID   guaiFENesin  600 mg Oral Q12H   ipratropium  1 spray Each Nare BID   ipratropium-albuterol  3 mL Nebulization TID   ketorolac  15 mg Intravenous Q6H   lactase  3,000 Units Oral TID WC   loratadine  10 mg Oral Daily   methylPREDNISolone (SOLU-MEDROL) injection  20 mg Intravenous Q12H   metoprolol succinate  50 mg Oral Daily   montelukast  10 mg Oral QHS   tamsulosin  0.4 mg Oral Daily   traZODone  100 mg Oral QHS   Continuous Infusions:  cefTRIAXone (ROCEPHIN)  IV 2 g (10/17/20 2100)    Assessment/Plan:  Clinical sepsis, present on admission.  Blood cultures growing strep species.  Currently on Rocephin.  Initially thought to have early pneumonia.  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  Unclear if this is septic related or not.  We will get orthopedic evaluation for right knee and right elbow pain.  With prior hardware in his neck unclear if I can get an MRI or not.  We will get a CT of the cervical spine.  Taper steroids that were started. Paroxysmal atrial fibrillation.  Currently in normal sinus rhythm.  On Eliquis for anticoagulation.  Metoprolol for rate control. COPD exacerbation on nebulizer treatments. Acute kidney injury on chronic kidney disease stage II.  Holding diuretics and ACE inhibitor.  Discontinue IV fluids.  Creatinine  1.61 on presentation and down to 1.23. Type 2 diabetes mellitus with peripheral neuropathy.  On Neurontin.  Continue NovoLog sliding scale Parkinson's disease on Sinemet Essential hypertension on Toprol-XL Weakness.  Physical therapy evaluation      Code Status:     Code Status Orders  (From admission, onward)           Start     Ordered   10/16/20 1956  Full code  Continuous        10/16/20 2006           Code Status History     This patient has a current code status but no historical code status.      Family Communication: Tried to reach the patient's sister on the phone Disposition Plan: Status is: Inpatient  Dispo: The patient is from: Rehab              Anticipated d/c is to: Rehab              Patient currently on IV antibiotics for positive blood culture she denies any   Difficult to place patient.  No  Consultants: Infectious disease Orthopedic surgery  Antibiotics: Rocephin  Time spent: 29 minutes  Mry [employer]

## 2020-10-18 NOTE — Progress Notes (Signed)
Occupational Therapy Treatment Patient Details Name: Raymond Hays MRN: [id number] DOB: 05/22/1945 Today's Date: 10/18/2020    History of present illness Raymond Hays is a 74yoM who comes to [facility] on 7/18 from [facility] with 4 days weakness, DOE. PMH: DM2, HTN, COPD, GAD, PND, PAF. Pt admitted c ARI, sepsis, AKI, dehydration. At baseline pt is able to perform supervised transfers bed to/from [facility], able to perform intermittent limited self propulsion in WC. Pt reportedly could AMB up to 56ft with therapy team using a RW.   OT comments  Pt seen for OT treatment on this date. Upon arrival to room, pt awake in bed, verbalizing desire to attempt OOB mobility this date d/t significantly less pain than yesterday. Pt continues to present with pain, decreased balance, decreased strength, and decreased activity tolerance, and requires MAX A+2 for bed mobility, MAX A for seated LB dressing, SET-UP assist to drink from cup while sitting EOB, and MAX A+2 to perform sit<>stand transfers with RW. Pt is steadily progressing towards goals, and was able to progress to sitting EOB unassisted and participating in seated ADLs at EOB this date. Pt continues to benefit from skilled OT services to maximize return to PLOF and minimize risk of future falls, injury, caregiver burden, and readmission. Will continue to follow POC. Discharge recommendation remains appropriate.     Follow Up Recommendations  SNF    Equipment Recommendations  Other (comment) (defer to next venue of care)       Precautions / Restrictions Precautions Precautions: Fall Restrictions Weight Bearing Restrictions: No       Mobility Bed Mobility Overal bed mobility: Needs Assistance Bed Mobility: Supine to Sit;Sit to Supine     Supine to sit: Max assist;HOB elevated Sit to supine: Max assist;+2 for physical assistance        Transfers Overall transfer level: Needs assistance Equipment used: Rolling walker (2 wheeled) Transfers: Sit  to/from Stand Sit to Stand: Max assist;+2 physical assistance         General transfer comment: Pt required verbal/tactile cues for positioning of b/l LE and HHA to place LUE on RW d/t contractures.    Balance Overall balance assessment: Needs assistance Sitting-balance support: Feet supported;Single extremity supported Sitting balance-Leahy Scale: Fair Sitting balance - Comments: Able to maintain static sitting balance at EOB this date, requiring no physical assist   Standing balance support: Bilateral upper extremity supported Standing balance-Leahy Scale: Zero Standing balance comment: MAX A+2 to maintain standing balance with b/l UE support on RW                           ADL either performed or assessed with clinical judgement   ADL Overall ADL's : Needs assistance/impaired Eating/Feeding: Set up;Supervision/ safety;Sitting Eating/Feeding Details (indicate cue type and reason): While sitting EOB, pt able to bring cup to mouth and drink via straw with RUE             Upper Body Dressing : Moderate assistance;Sitting Upper Body Dressing Details (indicate cue type and reason): while sitting EOB Lower Body Dressing: Maximal assistance;Sitting/lateral leans Lower Body Dressing Details (indicate cue type and reason): to don/doff socks                    Cognition Arousal/Alertness: Awake/alert Behavior During Therapy: WFL for tasks assessed/performed Overall Cognitive Status: History of cognitive impairments - at baseline  General Comments: Alert and oriented to self, place, and situation. Pt very motivated to re-attempt mobility this date, reporting significantly less pain this date.              General Comments SpO2>92% throughout    Pertinent Vitals/ Pain       Pain Assessment: Faces Faces Pain Scale: Hurts little more Pain Location: L knee following mobility Pain Descriptors / Indicators:  Aching;Discomfort Pain Intervention(s): Limited activity within patient's tolerance;Monitored during session;Repositioned;Premedicated before session   Frequency  Min 1X/week        Progress Toward Goals  OT Goals(current goals can now be found in the care plan section)  Progress towards OT goals: Progressing toward goals  Acute Rehab OT Goals Patient Stated Goal: be able to perform WC transfers again, have less pain OT Goal Formulation: With patient Time For Goal Achievement: 10/31/20 Potential to Achieve Goals: Fair  Plan Discharge plan remains appropriate;Frequency remains appropriate       AM-PAC OT "6 Clicks" Daily Activity     Outcome Measure   Help from another person eating meals?: A Little Help from another person taking care of personal grooming?: A Lot Help from another person toileting, which includes using toliet, bedpan, or urinal?: Total Help from another person bathing (including washing, rinsing, drying)?: A Lot Help from another person to put on and taking off regular upper body clothing?: A Lot Help from another person to put on and taking off regular lower body clothing?: A Lot 6 Click Score: 12    End of Session Equipment Utilized During Treatment: Oxygen;Gait belt;Rolling walker  OT Visit Diagnosis: Muscle weakness (generalized) (M62.81);Pain Pain - part of body: Shoulder;Arm;Knee (neck)   Activity Tolerance Patient tolerated treatment well   Patient Left in bed;with call bell/phone within reach;with bed alarm set   Nurse Communication Mobility status        Time: 1440-1511 OT Time Calculation (min): 31 min  Charges: OT General Charges $OT Visit: 1 Visit OT Treatments $Self Care/Home Management : 8-22 mins $Therapeutic Activity: 8-22 mins  Matthew Folks, OTR/L [facility] [phone]

## 2020-10-18 NOTE — Progress Notes (Signed)
Pt had small amount of fresh looking red blood noted in his brief after having a med. size soft bowl moment . This nurse notified on call MD. Valente David. No New orders received. Will pass on in report to on coming nurse.

## 2020-10-18 NOTE — Progress Notes (Signed)
*  PRELIMINARY RESULTS* Echocardiogram 2D Echocardiogram has been performed.  Raymond Hays 10/18/2020, 1:45 PM

## 2020-10-18 NOTE — Progress Notes (Signed)
    [facility] has been requested to perform a transesophageal echocardiogram on Raymond Hays for bacteremia.  After careful review of history and examination, the risks and benefits of transesophageal echocardiogram have been explained including risks of esophageal damage, perforation (1:10,000 risk), bleeding, pharyngeal hematoma as well as other potential complications associated with conscious sedation including aspiration, arrhythmia, respiratory failure and death. Alternatives to treatment were discussed, questions were answered. Patient is willing to proceed.  He is scheduled for 7/21 @ 9 AM w/ Dr. Mariah Milling.  Nicolasa Ducking, NP  10/18/2020 4:08 PM

## 2020-10-18 NOTE — TOC Progression Note (Signed)
Transition of Care [facility]) - Progression Note    Patient Details  Name: Raymond Hays MRN: [id number] Date of Birth: 01-10-1946  Transition of Care [facility] [place]) CM/SW Contact  Su Hilt, RN Phone Number: 10/18/2020, 2:06 PM  Clinical Narrative:    Met with the patient in the room, He stated that his plan is to return to [facility] as a long term resident, he is on Oxygen and will remain, I contacted [facility] to alert them that he is in a non [place] facility, [phone]         Expected Discharge Plan and Services                                                 Social Determinants of Health (SDOH) Interventions    Readmission Risk Interventions No flowsheet data found.

## 2020-10-19 ENCOUNTER — Inpatient Hospital Stay: Payer: No Typology Code available for payment source | Admitting: Anesthesiology

## 2020-10-19 ENCOUNTER — Encounter
Admission: EM | Disposition: A | Discharge status: Skilled Nursing Facility | Payer: Self-pay | Source: Skilled Nursing Facility | Attending: Internal Medicine

## 2020-10-19 ENCOUNTER — Encounter: Payer: Self-pay | Admitting: Cardiovascular Disease

## 2020-10-19 ENCOUNTER — Inpatient Hospital Stay (HOSPITAL_COMMUNITY)
Admit: 2020-10-19 | Discharge: 2020-10-19 | Disposition: A | Discharge status: Home / Self Care | Payer: No Typology Code available for payment source | Attending: Nurse Practitioner | Admitting: Nurse Practitioner

## 2020-10-19 DIAGNOSIS — I480 Paroxysmal atrial fibrillation: Secondary | ICD-10-CM

## 2020-10-19 DIAGNOSIS — J441 Chronic obstructive pulmonary disease with (acute) exacerbation: Secondary | ICD-10-CM

## 2020-10-19 DIAGNOSIS — M25561 Pain in right knee: Secondary | ICD-10-CM | POA: Diagnosis not present

## 2020-10-19 DIAGNOSIS — R7881 Bacteremia: Secondary | ICD-10-CM | POA: Diagnosis not present

## 2020-10-19 DIAGNOSIS — A408 Other streptococcal sepsis: Secondary | ICD-10-CM | POA: Diagnosis not present

## 2020-10-19 DIAGNOSIS — I361 Nonrheumatic tricuspid (valve) insufficiency: Secondary | ICD-10-CM | POA: Diagnosis not present

## 2020-10-19 DIAGNOSIS — I340 Nonrheumatic mitral (valve) insufficiency: Secondary | ICD-10-CM

## 2020-10-19 DIAGNOSIS — B955 Unspecified streptococcus as the cause of diseases classified elsewhere: Secondary | ICD-10-CM | POA: Diagnosis not present

## 2020-10-19 DIAGNOSIS — M542 Cervicalgia: Secondary | ICD-10-CM | POA: Diagnosis not present

## 2020-10-19 DIAGNOSIS — E1140 Type 2 diabetes mellitus with diabetic neuropathy, unspecified: Secondary | ICD-10-CM

## 2020-10-19 HISTORY — PX: TEE WITHOUT CARDIOVERSION: SHX5443

## 2020-10-19 LAB — BASIC METABOLIC PANEL
Anion gap: 7 (ref 5–15)
BUN: 54 mg/dL — ABNORMAL HIGH (ref 8–23)
CO2: 24 mmol/L (ref 22–32)
Calcium: 8.7 mg/dL — ABNORMAL LOW (ref 8.9–10.3)
Chloride: 104 mmol/L (ref 98–111)
Creatinine, Ser: 1.19 mg/dL (ref 0.61–1.24)
GFR, Estimated: >60 mL/min (ref >60)
Glucose, Bld: 208 mg/dL — ABNORMAL HIGH (ref 70–99)
Potassium: 5.1 mmol/L (ref 3.5–5.1)
Sodium: 135 mmol/L (ref 135–145)

## 2020-10-19 LAB — CBC
HCT: 30.9 % — ABNORMAL LOW (ref 39.0–52.0)
Hemoglobin: 10 g/dL — ABNORMAL LOW (ref 13.0–17.0)
MCH: 28.6 pg (ref 26.0–34.0)
MCHC: 32.4 g/dL (ref 30.0–36.0)
MCV: 88.3 fL (ref 80.0–100.0)
Platelets: 217 10*3/uL (ref 150–400)
RBC: 3.5 MIL/uL — ABNORMAL LOW (ref 4.22–5.81)
RDW: 15.5 % (ref 11.5–15.5)
WBC: 13 10*3/uL — ABNORMAL HIGH (ref 4.0–10.5)
nRBC: 0 % (ref 0.0–0.2)

## 2020-10-19 LAB — C-REACTIVE PROTEIN: CRP: 21 mg/dL — ABNORMAL HIGH (ref <1.0)

## 2020-10-19 LAB — CULTURE, BLOOD (ROUTINE X 2): Special Requests: ADEQUATE

## 2020-10-19 LAB — SEDIMENTATION RATE: Sed Rate: 65 mm/hr — ABNORMAL HIGH (ref 0–20)

## 2020-10-19 SURGERY — ECHOCARDIOGRAM, TRANSESOPHAGEAL
Anesthesia: General

## 2020-10-19 MED ORDER — MIDAZOLAM HCL 5 MG/5ML IJ SOLN
INTRAMUSCULAR | Status: AC
  Filled 2020-10-19: qty 5

## 2020-10-19 MED ORDER — BUDESONIDE 0.5 MG/2ML IN SUSP
0.5000 mg | Freq: Two times a day (BID) | RESPIRATORY_TRACT | Status: DC
  Administered 2020-10-19 – 2020-10-31 (×22): 0.5 mg via RESPIRATORY_TRACT
  Filled 2020-10-19 (×23): qty 2

## 2020-10-19 MED ORDER — BUTAMBEN-TETRACAINE-BENZOCAINE 2-2-14 % EX AERO
INHALATION_SPRAY | CUTANEOUS | Status: AC
  Filled 2020-10-19: qty 5

## 2020-10-19 MED ORDER — FENTANYL CITRATE (PF) 100 MCG/2ML IJ SOLN
INTRAMUSCULAR | Status: AC
  Filled 2020-10-19: qty 2

## 2020-10-19 MED ORDER — IPRATROPIUM-ALBUTEROL 0.5-2.5 (3) MG/3ML IN SOLN
RESPIRATORY_TRACT | Status: AC
  Filled 2020-10-19: qty 3

## 2020-10-19 MED ORDER — PROPOFOL 10 MG/ML IV BOLUS
INTRAVENOUS | Status: DC | PRN
  Administered 2020-10-19: 30 mg via INTRAVENOUS
  Administered 2020-10-19: 60 mg via INTRAVENOUS
  Administered 2020-10-19 (×2): 30 mg via INTRAVENOUS

## 2020-10-19 MED ORDER — METHYLPREDNISOLONE SODIUM SUCC 40 MG IJ SOLR
40.0000 mg | Freq: Two times a day (BID) | INTRAMUSCULAR | Status: DC
  Administered 2020-10-20: 40 mg via INTRAVENOUS
  Filled 2020-10-19 (×2): qty 1

## 2020-10-19 MED ORDER — SODIUM ZIRCONIUM CYCLOSILICATE 10 G PO PACK
10.0000 g | PACK | Freq: Once | ORAL | Status: AC
  Administered 2020-10-19: 10 g via ORAL
  Filled 2020-10-19: qty 1

## 2020-10-19 MED ORDER — LIDOCAINE VISCOUS HCL 2 % MT SOLN
OROMUCOSAL | Status: AC
  Filled 2020-10-19: qty 15

## 2020-10-19 MED ORDER — SODIUM CHLORIDE 0.9 % IV SOLN: INTRAVENOUS | Status: DC

## 2020-10-19 MED ORDER — FUROSEMIDE 20 MG PO TABS
20.0000 mg | ORAL_TABLET | Freq: Every day | ORAL | Status: DC
  Administered 2020-10-19 – 2020-10-21 (×3): 20 mg via ORAL
  Filled 2020-10-19 (×3): qty 1

## 2020-10-19 NOTE — Anesthesia Procedure Notes (Signed)
Date/Time: 10/19/2020 9:12 AM Performed by: Joanette Gula, Cheyla Duchemin, CRNA Pre-anesthesia Checklist: Emergency Drugs available, Suction available, Patient identified, Patient being monitored and Timeout performed Oxygen Delivery Method: Simple face mask Induction Type: IV induction

## 2020-10-19 NOTE — Transfer of Care (Signed)
Immediate Anesthesia Transfer of Care Note  Patient: Raymond Hays  Procedure(s) Performed: TRANSESOPHAGEAL ECHOCARDIOGRAM (TEE)  Patient Location: Cath Lab  Anesthesia Type:General  Level of Consciousness: awake, alert  and oriented  Airway & Oxygen Therapy: Patient Spontanous Breathing and Patient connected to face mask oxygen  Post-op Assessment: Report given to RN and Post -op Vital signs reviewed and stable  Post vital signs: Reviewed and stable  Last Vitals:  Vitals Value Taken Time  BP 118/69 10/19/20 0933  Temp    Pulse 79 10/19/20 0934  Resp 16 10/19/20 0934  SpO2 100 % 10/19/20 0934    Last Pain:  Vitals:   10/19/20 0747  TempSrc: Oral  PainSc: 0-No pain         Complications: No notable events documented.

## 2020-10-19 NOTE — Anesthesia Postprocedure Evaluation (Signed)
Anesthesia Post Note  Patient: Raymond Hays  Procedure(s) Performed: TRANSESOPHAGEAL ECHOCARDIOGRAM (TEE)  Patient location during evaluation: Phase II Anesthesia Type: General Level of consciousness: awake and alert, awake and oriented Pain management: pain level controlled Vital Signs Assessment: post-procedure vital signs reviewed and stable Respiratory status: spontaneous breathing, nonlabored ventilation and respiratory function stable Cardiovascular status: blood pressure returned to baseline and stable Postop Assessment: no apparent nausea or vomiting Anesthetic complications: no   No notable events documented.   Last Vitals:  Vitals:   10/19/20 1015 10/19/20 1030  BP: 127/73 119/79  Pulse: 85 96  Resp: 20 16  Temp:    SpO2: 97% 97%    Last Pain:  Vitals:   10/19/20 1030  TempSrc:   PainSc: 0-No pain                 Manfred Arch

## 2020-10-19 NOTE — Progress Notes (Signed)
Patient ID: Raymond Hays, male   DOB: June 08, 1945, 75 y.o.   MRN: [id number] [facility] PROGRESS NOTE  Raymond Hays WNU:[id number] DOB: September 23, 1945 DOA: 10/16/2020 PCP: [facility]  HPI/Subjective: Patient having some cough and some wheeze.  Joint pain is less than when he came in.  Itching has disappeared.  Admitted with clinical sepsis.  Objective: Vitals:   10/19/20 1030 10/19/20 1100  BP: 119/79 118/84  Pulse: 96 81  Resp: 16 16  Temp:    SpO2: 97% 99%    Intake/Output Summary (Last 24 hours) at 10/19/2020 1356 Last data filed at 10/19/2020 0932 Gross per 24 hour  Intake 940.17 ml  Output 1200 ml  Net -259.83 ml   Filed Weights   10/16/20 1534  Weight: 103 kg    ROS: Review of Systems  Respiratory:  Positive for cough, shortness of breath and wheezing.   Cardiovascular:  Negative for chest pain.  Gastrointestinal:  Negative for abdominal pain, nausea and vomiting.  Exam: Physical Exam HENT:     Head: Normocephalic.     Mouth/Throat:     Pharynx: No oropharyngeal exudate.  Eyes:     General: Lids are normal.     Conjunctiva/sclera: Conjunctivae normal.  Cardiovascular:     Rate and Rhythm: Normal rate and regular rhythm.     Heart sounds: Normal heart sounds, S1 normal and S2 normal.  Pulmonary:     Breath sounds: Examination of the right-middle field reveals wheezing. Examination of the left-middle field reveals wheezing. Examination of the right-lower field reveals decreased breath sounds and rhonchi. Examination of the left-lower field reveals decreased breath sounds and rhonchi. Decreased breath sounds, wheezing and rhonchi present. No rales.  Abdominal:     Palpations: Abdomen is soft.     Tenderness: There is no abdominal tenderness.  Musculoskeletal:     Right lower leg: No swelling.     Left lower leg: No swelling.  Skin:    General: Skin is warm.     Findings: No rash.  Neurological:     Mental Status: He is alert and oriented to person,  place, and time.     Data Reviewed: Basic Metabolic Panel: Recent Labs  Lab 10/16/20 1536 10/17/20 0612 10/19/20 0112  NA 137 137 135  K 4.7 4.1 5.1  CL 102 104 104  CO2 24 23 24   GLUCOSE 153* 174* 208*  BUN 38* 36* 54*  CREATININE 1.61* 1.23 1.19  CALCIUM 8.8* 8.2* 8.7*  MG 1.9  --   --    Liver Function Tests: Recent Labs  Lab 10/16/20 1536 10/17/20 0612  AST 14* 9*  ALT 6 10  ALKPHOS 72 57  BILITOT 1.2 0.8  PROT 7.1 6.3*  ALBUMIN 3.2* 2.7*   CBC: Recent Labs  Lab 10/16/20 1536 10/19/20 0112  WBC 18.0* 13.0*  NEUTROABS 16.3*  --   HGB 11.4* 10.0*  HCT 35.0* 30.9*  MCV 86.8 88.3  PLT 196 217   BNP (last 3 results) Recent Labs    12/27/19 1605  BNP 102.6*     CBG: Recent Labs  Lab 10/18/20 1738  GLUCAP 199*    Recent Results (from the past 240 hour(s))  Resp Panel by RT-PCR (Flu A&B, Covid) Nasopharyngeal Swab     Status: None   Collection Time: 10/16/20  3:36 PM   Specimen: Nasopharyngeal Swab; Nasopharyngeal(NP) swabs in vial transport medium  Result Value Ref Range Status   SARS Coronavirus 2 by RT PCR NEGATIVE NEGATIVE Final  Comment: (NOTE) SARS-CoV-2 target nucleic acids are NOT DETECTED.  The SARS-CoV-2 RNA is generally detectable in upper respiratory specimens during the acute phase of infection. The lowest concentration of SARS-CoV-2 viral copies this assay can detect is 138 copies/mL. A negative result does not preclude SARS-Cov-2 infection and should not be used as the sole basis for treatment or other patient management decisions. A negative result may occur with  improper specimen collection/handling, submission of specimen other than nasopharyngeal swab, presence of viral mutation(s) within the areas targeted by this assay, and inadequate number of viral copies(<138 copies/mL). A negative result must be combined with clinical observations, patient history, and epidemiological information. The expected result is  Negative.  Fact Sheet for Patients:  BloggerCourse.com  Fact Sheet for Healthcare Providers:  SeriousBroker.it  This test is no t yet approved or cleared by the [place] FDA and  has been authorized for detection and/or diagnosis of SARS-CoV-2 by FDA under an Emergency Use Authorization (EUA). This EUA will remain  in effect (meaning this test can be used) for the duration of the COVID-19 declaration under Section 564(b)(1) of the Act, 21 U.S.C.section 360bbb-3(b)(1), unless the authorization is terminated  or revoked sooner.       Influenza A by PCR NEGATIVE NEGATIVE Final   Influenza B by PCR NEGATIVE NEGATIVE Final    Comment: (NOTE) The Xpert Xpress SARS-CoV-2/FLU/RSV plus assay is intended as an aid in the diagnosis of influenza from Nasopharyngeal swab specimens and should not be used as a sole basis for treatment. Nasal washings and aspirates are unacceptable for Xpert Xpress SARS-CoV-2/FLU/RSV testing.  Fact Sheet for Patients: BloggerCourse.com  Fact Sheet for Healthcare Providers: SeriousBroker.it  This test is not yet approved or cleared by the [place] FDA and has been authorized for detection and/or diagnosis of SARS-CoV-2 by FDA under an Emergency Use Authorization (EUA). This EUA will remain in effect (meaning this test can be used) for the duration of the COVID-19 declaration under Section 564(b)(1) of the Act, 21 U.S.C. section 360bbb-3(b)(1), unless the authorization is terminated or revoked.  Performed at [facility], [address] [place], [place] [address]   Blood culture (routine x 2)     Status: Abnormal   Collection Time: 10/16/20  7:26 PM   Specimen: BLOOD LEFT FOREARM  Result Value Ref Range Status   Specimen Description   Final    BLOOD LEFT FOREARM Performed at [facility], [address] [place], [place]  [address]    Special Requests   Final    BOTTLES DRAWN AEROBIC AND ANAEROBIC Blood Culture adequate volume Performed at [facility], [address], [place], [place] [address]    Culture  Setup Time   Final    GRAM POSITIVE COCCI IN BOTH AEROBIC AND ANAEROBIC BOTTLES CRITICAL RESULT CALLED TO, READ BACK BY AND VERIFIED WITH: KRISTEN MERRIL, PHARMD AT 0830 ON 10/17/20 BY GM Performed at [facility], [address] [place], [place] [address]    Culture STREPTOCOCCUS GROUP C (A)  Final   Report Status 10/19/2020 FINAL  Final   Organism ID, Bacteria STREPTOCOCCUS GROUP C  Final      Susceptibility   Streptococcus group c - MIC*    CLINDAMYCIN <=0.25 SENSITIVE Sensitive     AMPICILLIN <=0.25 SENSITIVE Sensitive     ERYTHROMYCIN <=0.12 SENSITIVE Sensitive     VANCOMYCIN 0.5 SENSITIVE Sensitive     CEFTRIAXONE <=0.12      LEVOFLOXACIN 1 SENSITIVE  Sensitive     PENICILLIN Value in next row Sensitive      SENSITIVE0.06    * STREPTOCOCCUS GROUP C  Blood Culture ID Panel (Reflexed)     Status: Abnormal   Collection Time: 10/16/20  7:26 PM  Result Value Ref Range Status   Enterococcus faecalis NOT DETECTED NOT DETECTED Final   Enterococcus Faecium NOT DETECTED NOT DETECTED Final   Listeria monocytogenes NOT DETECTED NOT DETECTED Final   Staphylococcus species NOT DETECTED NOT DETECTED Final   Staphylococcus aureus (BCID) NOT DETECTED NOT DETECTED Final   Staphylococcus epidermidis NOT DETECTED NOT DETECTED Final   Staphylococcus lugdunensis NOT DETECTED NOT DETECTED Final   Streptococcus species DETECTED (A) NOT DETECTED Final    Comment: Not Enterococcus species, Streptococcus agalactiae, Streptococcus pyogenes, or Streptococcus pneumoniae. CRITICAL RESULT CALLED TO, READ BACK BY AND VERIFIED WITH: KRISTEN MERRIL, PHARMD AT 0830 ON 10/17/20 BY GM    Streptococcus agalactiae NOT DETECTED NOT DETECTED Final   Streptococcus pneumoniae NOT DETECTED NOT DETECTED Final    Streptococcus pyogenes NOT DETECTED NOT DETECTED Final   A.calcoaceticus-baumannii NOT DETECTED NOT DETECTED Final   Bacteroides fragilis NOT DETECTED NOT DETECTED Final   Enterobacterales NOT DETECTED NOT DETECTED Final   Enterobacter cloacae complex NOT DETECTED NOT DETECTED Final   Escherichia coli NOT DETECTED NOT DETECTED Final   Klebsiella aerogenes NOT DETECTED NOT DETECTED Final   Klebsiella oxytoca NOT DETECTED NOT DETECTED Final   Klebsiella pneumoniae NOT DETECTED NOT DETECTED Final   Proteus species NOT DETECTED NOT DETECTED Final   Salmonella species NOT DETECTED NOT DETECTED Final   Serratia marcescens NOT DETECTED NOT DETECTED Final   Haemophilus influenzae NOT DETECTED NOT DETECTED Final   Neisseria meningitidis NOT DETECTED NOT DETECTED Final   Pseudomonas aeruginosa NOT DETECTED NOT DETECTED Final   Stenotrophomonas maltophilia NOT DETECTED NOT DETECTED Final   Candida albicans NOT DETECTED NOT DETECTED Final   Candida auris NOT DETECTED NOT DETECTED Final   Candida glabrata NOT DETECTED NOT DETECTED Final   Candida krusei NOT DETECTED NOT DETECTED Final   Candida parapsilosis NOT DETECTED NOT DETECTED Final   Candida tropicalis NOT DETECTED NOT DETECTED Final   Cryptococcus neoformans/gattii NOT DETECTED NOT DETECTED Final    Comment: Performed at [facility], [address] [place], [place] [address]  Blood culture (routine x 2)     Status: None (Preliminary result)   Collection Time: 10/16/20 11:23 PM   Specimen: BLOOD RIGHT FOREARM  Result Value Ref Range Status   Specimen Description BLOOD RIGHT FOREARM  Final   Special Requests   Final    BOTTLES DRAWN AEROBIC AND ANAEROBIC Blood Culture adequate volume   Culture   Final    NO GROWTH 3 DAYS Performed at [facility], [address] [place], [place] [address]    Report Status PENDING  Incomplete  CULTURE, BLOOD (ROUTINE X 2) w Reflex to ID Panel     Status: None (Preliminary result)    Collection Time: 10/19/20  1:11 AM   Specimen: BLOOD RIGHT FOREARM  Result Value Ref Range Status   Specimen Description BLOOD RIGHT FOREARM  Final   Special Requests   Final    BOTTLES DRAWN AEROBIC AND ANAEROBIC Blood Culture results may not be optimal due to an excessive volume of blood received in culture bottles   Culture   Final    NO GROWTH < 12 HOURS Performed at [facility], [address], [place], [place] [address]  Report Status PENDING  Incomplete  CULTURE, BLOOD (ROUTINE X 2) w Reflex to ID Panel     Status: None (Preliminary result)   Collection Time: 10/19/20  1:17 AM   Specimen: BLOOD RIGHT HAND  Result Value Ref Range Status   Specimen Description BLOOD RIGHT HAND  Final   Special Requests   Final    BOTTLES DRAWN AEROBIC AND ANAEROBIC Blood Culture adequate volume   Culture   Final    NO GROWTH < 12 HOURS Performed at [facility], [address] [place], [place] [address]    Report Status PENDING  Incomplete     Studies: CT CERVICAL SPINE W CONTRAST  Result Date: 10/19/2020 CLINICAL DATA:  Acute neck pain.  Infection suspected. EXAM: CT CERVICAL SPINE WITH CONTRAST TECHNIQUE: Multidetector CT imaging of the cervical spine was performed during intravenous contrast administration. Multiplanar CT image reconstructions were also generated. CONTRAST:  75mL OMNIPAQUE IOHEXOL 300 MG/ML  SOLN COMPARISON:  Cervical spine radiographs 10/17/2020. Report from 08/07/2018 cervical spine CT from [facility]. FINDINGS: Alignment: Cervical spine straightening.  No significant listhesis. Skull base and vertebrae: Extensive postsurgical changes from C4-5 corpectomy, C3-C6 anterior fusion, and C3-C7 posterior fusion with associated laminectomies. There is evidence of solid anterior and posterior osseous fusion at the operative levels. No lucency is seen about the screws to suggest loosening or infection. Within limitations of artifact from the hardware and image  noise, no acute fracture or destructive osseous process is identified. There is asymmetrically severe right lateral and median C1-2 arthropathy with mild basilar impression which was also described in the prior outside CT report. Soft tissues and spinal canal: No evidence of significant prevertebral soft tissue swelling or fluid within limitations of streak artifact. Poor assessment of the spinal canal due to artifact. Disc levels: Calcified pannus at C1-2 result in mild spinal stenosis. There is advanced neural foraminal stenosis on the left at C2-3, on the right at C3-4, and bilaterally at C4-5 and C5-6 due to uncovertebral and facet spurring. At C7-T1, there is moderate disc space narrowing with uncovertebral and facet spurring resulting in moderate bilateral neural foraminal stenosis. At T1-2, there is severe disc space narrowing with endplate and facet spurring resulting in moderate to severe bilateral neural foraminal stenosis. Upper chest: Clear lung apices. Other: Prominent motion artifact through the pharynx, larynx, and trachea. IMPRESSION: 1. No acute osseous abnormality identified in the cervical spine. 2. Extensive postsurgical changes from C3-C6 anterior and C3-C7 posterior fusion. 3. 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 Electronically Signed   By: Sebastian Ache M.D.   On: 10/19/2020 08:28   ECHOCARDIOGRAM COMPLETE  Result Date: 10/18/2020    ECHOCARDIOGRAM REPORT   Patient Name:   Raymond Hays Date of Exam: 10/18/2020 Medical Rec #:  [id number]  Height:       67.0 in Accession #:    [id number] Weight:       227.0 lb Date of Birth:  08-04-1945  BSA:          2.134 m Patient Age:    74 years   BP:           111/83 mmHg Patient Gender: M          HR:           81 bpm. Exam Location:  [facility] Procedure: 2D Echo, Cardiac Doppler and Color Doppler Indications:     Bacteremia R78.81  History:  Patient has no prior  history of Echocardiogram examinations.                  COPD; Risk Factors:Diabetes.                   Mitral Valve: valve is present in the mitral position.  Sonographer:     Cristela BlueJerry Hege RDCS (AE) Referring Phys:  [id number] Alford HighlandICHARD Andie Mortimer Diagnosing Phys: Debbe OdeaBrian Agbor-Etang MD  Sonographer Comments: Suboptimal apical window. IMPRESSIONS  1. Left ventricular ejection fraction, by estimation, is 55%. The left ventricle has low normal function. The left ventricle has no regional wall motion abnormalities. Left ventricular diastolic parameters are consistent with Grade II diastolic dysfunction (pseudonormalization).  2. Right ventricular systolic function is normal. The right ventricular size is normal.  3. Left atrial size was severely dilated.  4. Right atrial size was mildly dilated.  5. 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 there is no evidence for vegetation.. The mitral valve has been repaired/replaced. Mild mitral valve regurgitation. The mean mitral valve gradient is 4.0 mmHg. There is a present in the mitral position.  Echo findings are consistent with normal structure and function of the mitral valve prosthesis.  6. The aortic valve is grossly normal. Aortic valve regurgitation is not visualized. Conclusion(s)/Recommendation(s): No evidence of valvular vegetations on this transthoracic echocardiogram. Would recommend a transesophageal echocardiogram to exclude infective endocarditis if clinically indicated. FINDINGS  Left Ventricle: Left ventricular ejection fraction, by estimation, is 55%. The left ventricle has low normal function. The left ventricle has no regional wall motion abnormalities. The left ventricular internal cavity size was normal in size. There is no left ventricular hypertrophy. Left ventricular diastolic parameters are consistent with Grade II diastolic dysfunction (pseudonormalization). Right Ventricle: The right ventricular size is normal. No increase in right ventricular wall  thickness. Right ventricular systolic function is normal. Left Atrium: Left atrial size was severely dilated. Right Atrium: Right atrial size was mildly dilated. Pericardium: There is no evidence of pericardial effusion. 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 there is no evidence for vegetation. The mitral valve has been repaired/replaced. Mild mitral valve regurgitation. There is a present in the mitral position. Echo findings are consistent with normal structure and function of the mitral valve prosthesis. MV peak gradient, 10.1 mmHg. The mean mitral valve gradient is 4.0 mmHg. Tricuspid Valve: The tricuspid valve is normal in structure. Tricuspid valve regurgitation is trivial. Aortic Valve: The aortic valve is grossly normal. Aortic valve regurgitation is not visualized. Aortic valve mean gradient measures 2.0 mmHg. Aortic valve peak gradient measures 4.1 mmHg. Aortic valve area, by VTI measures 2.76 cm. Pulmonic Valve: The pulmonic valve was not well visualized. Pulmonic valve regurgitation is not visualized. Aorta: The aortic root is normal in size and structure. IAS/Shunts: No atrial level shunt detected by color flow Doppler.  LEFT VENTRICLE PLAX 2D LVIDd:         4.42 cm  Diastology LVIDs:         2.33 cm  LV e' medial:    4.79 cm/s LV PW:         1.01 cm  LV E/e' medial:  29.0 LV IVS:        0.89 cm  LV e' lateral:   7.62 cm/s LVOT diam:     2.10 cm  LV E/e' lateral: 18.2 LV SV:         48 LV SV Index:   23 LVOT Area:  3.46 cm  RIGHT VENTRICLE RV Basal diam:  3.80 cm RV S prime:     11.00 cm/s TAPSE (M-mode): 4.0 cm LEFT ATRIUM            Index       RIGHT ATRIUM           Index LA diam:      5.10 cm  2.39 cm/m  RA Area:     26.10 cm LA Vol (A2C): 117.0 ml 54.82 ml/m RA Volume:   79.30 ml  37.16 ml/m LA Vol (A4C): 105.0 ml 49.20 ml/m  AORTIC VALVE                   PULMONIC VALVE AV Area (Vmax):    2.41 cm    PV Vmax:        0.47 m/s AV Area (Vmean):   2.48 cm    PV Peak  grad:   0.9 mmHg AV Area (VTI):     2.76 cm    RVOT Peak grad: 2 mmHg AV Vmax:           101.30 cm/s AV Vmean:          63.850 cm/s AV VTI:            0.176 m AV Peak Grad:      4.1 mmHg AV Mean Grad:      2.0 mmHg LVOT Vmax:         70.50 cm/s LVOT Vmean:        45.800 cm/s LVOT VTI:          0.140 m LVOT/AV VTI ratio: 0.80  AORTA Ao Root diam: 3.50 cm MITRAL VALVE                TRICUSPID VALVE MV Area (PHT): 2.88 cm     TR Peak grad:   29.8 mmHg MV Area VTI:   1.35 cm     TR Vmax:        273.00 cm/s MV Peak grad:  10.1 mmHg MV Mean grad:  4.0 mmHg     SHUNTS MV Vmax:       1.59 m/s     Systemic VTI:  0.14 m MV Vmean:      94.6 cm/s    Systemic Diam: 2.10 cm MV Decel Time: 263 msec MV E velocity: 139.00 cm/s MV A velocity: 118.00 cm/s MV E/A ratio:  1.18 Debbe Odea MD Electronically signed by Debbe Odea MD Signature Date/Time: 10/18/2020/2:01:47 PM    Final     Scheduled Meds:  apixaban  5 mg Oral BID   ascorbic acid  500 mg Oral Daily   budesonide (PULMICORT) nebulizer solution  0.5 mg Nebulization BID   butamben-tetracaine-benzocaine       carbidopa-levodopa  1 tablet Oral TID   Chlorhexidine Gluconate Cloth  6 each Topical Daily   escitalopram  5 mg Oral Daily   feeding supplement  237 mL Oral BID BM   finasteride  5 mg Oral Daily   fluticasone  1 spray Each Nare Daily   furosemide  20 mg Oral Daily   gabapentin  800 mg Oral BID   guaiFENesin  600 mg Oral Q12H   ipratropium  1 spray Each Nare BID   ipratropium-albuterol  3 mL Nebulization TID   ketorolac  15 mg Intravenous Q6H   lactase  3,000 Units Oral TID WC   lidocaine       loratadine  10 mg  Oral Daily   methylPREDNISolone (SOLU-MEDROL) injection  20 mg Intravenous Daily   metoprolol succinate  50 mg Oral Daily   montelukast  10 mg Oral QHS   tamsulosin  0.4 mg Oral Daily   traZODone  100 mg Oral QHS   Continuous Infusions:  cefTRIAXone (ROCEPHIN)  IV Stopped (10/18/20 2229)    Assessment/Plan:  Clinical  sepsis, present on admission.  Blood cultures growing group C streptococcus.  Currently on Rocephin.  Initially thought to have early pneumonia.  TEE negative for endocarditis.  Orthopedic surgery did not think the right knee and right elbow was septic. COPD exacerbation.  Increase steroids back to 40 mg twice daily.  Add budesonide nebulizers.  Continue DuoNeb nebulizers.  Repeat chest x-ray tomorrow morning Right knee pain, right elbow pain and neck pain.  Orthopedic surgery did not believe that the knee and elbow with septic.  CT scan of the cervical spine negative for osteomyelitis. Paroxysmal atrial fibrillation.  Currently in normal sinus rhythm.  On Eliquis for anticoagulation.  Metoprolol for rate control. Acute kidney injury on chronic kidney disease stage II.  Creatinine 1.61 on presentation and down to 1.19. 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  Continue NovoLog sliding scale Parkinson's disease on Sinemet Essential hypertension on Toprol-XL Weakness.  Physical therapy evaluation        Code Status:     Code Status Orders  (From admission, onward)           Start     Ordered   10/16/20 1956  Full code  Continuous        10/16/20 2006           Code Status History     This patient has a current code status but no historical code status.      Disposition Plan: Status is: Inpatient  Dispo: The patient is from: Rehab              Anticipated d/c is to: Rehab              Patient currently being treated for sepsis with group C streptococcus with IV antibiotics.  Repeat blood cultures.   Difficult to place patient.  No.  Consultants: Infectious disease  Procedures: TEE  Antibiotics: Rocephin  Time spent: 27 minutes, case discussed with cardiology and infectious disease  Heloise Gordan [employer]  [facility]

## 2020-10-19 NOTE — Anesthesia Preprocedure Evaluation (Signed)
Anesthesia Evaluation  Patient identified by MRN, date of birth, ID band Patient awake    Reviewed: Allergy & Precautions, NPO status , Patient's Chart, lab work & pertinent test results, reviewed documented beta blocker date and time   Airway Mallampati: III  TM Distance: >3 FB Neck ROM: Full    Dental  (+) Missing, Loose, Poor Dentition, Dental Advisory Given   Pulmonary COPD, former smoker,    Pulmonary exam normal        Cardiovascular hypertension, Pt. on home beta blockers and Pt. on medications Normal cardiovascular exam+ dysrhythmias Atrial Fibrillation + Valvular Problems/Murmurs MR      Neuro/Psych Anxiety negative neurological ROS  negative psych ROS   GI/Hepatic negative GI ROS, Neg liver ROS,   Endo/Other  negative endocrine ROSdiabetes, Well Controlled  Renal/GU Renal disease  negative genitourinary   Musculoskeletal  (+) Arthritis , Osteoarthritis,    Abdominal   Peds negative pediatric ROS (+)  Hematology negative hematology ROS (+)   Anesthesia Other Findings Allergic rhinitis    Anxiety    Arthritis    Chronic indwelling Foley catheter COPD (chronic obstructive pulmonary disease) (HCC)    Cough    Diabetes (HCC)    Essential (primary) hypertension  Hemorrhoid    Hyperlipidemia    PAF (paroxysmal atrial fibrillation) (HCC) Primary osteoarthritis, unspecified shoulder    Retention of urine, unspecified  Spinal stenosis    UTI (urinary tract infection)  1. Left ventricular ejection fraction, by estimation, is 55%. The left  ventricle has low normal function. The left ventricle has no regional wall  motion abnormalities. Left ventricular diastolic parameters are consistent  with Grade II diastolic  dysfunction (pseudonormalization).  2. Right ventricular systolic function is normal. The right ventricular  size is normal.  3. Left atrial size was severely dilated.  4. Right atrial size  was mildly dilated.  5. 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 there is no evidence  for vegetation.. The mitral valve has been repaired/replaced. Mild mitral  valve regurgitation. The mean mitral valve gradient is 4.0 mmHg. There is  a present in the mitral position.  Echo findings are consistent with normal structure and function of the  mitral valve prosthesis.  6. The aortic valve is grossly normal. Aortic valve regurgitation is not  visualized.       Reproductive/Obstetrics negative OB ROS                           Anesthesia Physical Anesthesia Plan  ASA: 3  Anesthesia Plan: General   Post-op Pain Management:    Induction: Intravenous  PONV Risk Score and Plan: 2 and Propofol infusion and TIVA  Airway Management Planned: Simple Face Mask, Natural Airway and Nasal Cannula  Additional Equipment:   Intra-op Plan:   Post-operative Plan:   Informed Consent: I have reviewed the patients History and Physical, chart, labs and discussed the procedure including the risks, benefits and alternatives for the proposed anesthesia with the patient or authorized representative who has indicated his/her understanding and acceptance.       Plan Discussed with: CRNA, Anesthesiologist and Surgeon  Anesthesia Plan Comments:         Anesthesia Quick Evaluation

## 2020-10-19 NOTE — Progress Notes (Signed)
PT Cancellation Note  Patient Details Name: Raymond Hays MRN: [id number] DOB: [birth year]   Cancelled Treatment:    Reason Eval/Treat Not Completed: Medical issues which prohibited therapy;Patient not medically ready (Pt underwent general anesthesia this date for TEE. WIll need new PT order or continuation prior to continuation of services. WIll defer services at this time, resume once medically cleared.)  1:56 PM, 10/19/20 Rosamaria Lints, PT, DPT Physical Therapist - [facility]  [phone] (ASCOM)    [name] C 10/19/2020, 1:56 PM

## 2020-10-19 NOTE — Progress Notes (Signed)
OT Cancellation Note  Patient Details Name: Raymond Hays MRN: [id number] DOB: 10-Nov-1945   Cancelled Treatment:    Reason Eval/Treat Not Completed: Medical issues which prohibited therapy. Pt underwent TEE this AM, under general anesthesia. WIll need new OT order prior to continuation of services. Will re-evaluation and resume rehab services after new order received and as pt is medically ready.  Latina Craver, PhD, MS, OTR/L 10/19/20, 2:22 PM

## 2020-10-19 NOTE — Progress Notes (Signed)
ID Patient says neck pain is better Right elbow and right knee pain is resolved No fever Has some cough and some wheezing. No itching since he is hospitalized. Had TEE negative for endocarditis  On examination  stable with no discomfort BP (!) 132/92 (BP Location: Right Arm)   Pulse 74   Temp 98 F (36.7 C) (Oral)   Resp 18   Wt 103 kg   SpO2 99%   BMI 35.55 kg/m   Chest bilateral air entry Few rhonchi. Crepitations in the bases Heart sound S1-S2 Systolic murmur Abdominal examination benign Mild ankle edema CNS nonfocal Skin excoriation over his arms and body.  Labs  CBC Latest Ref Rng & Units 10/19/2020 10/16/2020 12/27/2019  WBC 4.0 - 10.5 K/uL 13.0(H) 18.0(H) 8.9  Hemoglobin 13.0 - 17.0 g/dL 10.0(L) 11.4(L) 13.0  Hematocrit 39.0 - 52.0 % 30.9(L) 35.0(L) 40.0  Platelets 150 - 400 K/uL 217 196 240     CMP Latest Ref Rng & Units 10/19/2020 10/17/2020 10/16/2020  Glucose 70 - 99 mg/dL 341(P) 379(K) 240(X)  BUN 8 - 23 mg/dL 73(Z) 32(D) 92(E)  Creatinine 0.61 - 1.24 mg/dL 2.68 3.41 9.62(I)  Sodium 135 - 145 mmol/L 135 137 137  Potassium 3.5 - 5.1 mmol/L 5.1 4.1 4.7  Chloride 98 - 111 mmol/L 104 104 102  CO2 22 - 32 mmol/L 24 23 24   Calcium 8.9 - 10.3 mg/dL ) 2.9(N) 9.8(X)  Total Protein 6.5 - 8.1 g/dL - 6.3(L) 7.1  Total Bilirubin 0.3 - 1.2 mg/dL - 0.8 1.2  Alkaline Phos 38 - 126 U/L - 57 72  AST 15 - 41 U/L - 9(L) 14(L)  ALT 0 - 44 U/L - 10 6     Micro 10/16/2020 blood culture Streptococcus group C in 1 of 2 sets 10/19/2020 blood cultures repeated   Impression/recommendation  75 year old male with multiple comorbidities  Streptococcus group C bacteremia.  Since patient had mitral valve repair with clips endocarditis needed to be ruled out and TEE was done today which showed no vegetations.  Patient also has cervical spine hardware and because he was complaining of increasing pain underwent a CT of the cervical spine with no evidence of infection. Patient  is currently on ceftriaxone.  Because of his CHF status and needing Lasix we will not change to penicillin or ampicillin because of the volume overload from those antibiotics.  I am erring towards the side of treating him with a 4-week course of antibiotics because of multiple risk factors for deep infection  New right elbow and right knee pain is much improved Seen by Dr. 73 orthopedic surgeon and septic arthritis not suspected currently  Mitral regurgitation/CHF.  Needing mitral valve repair with clips Also on diuretic  Paroxysmal A. fib on Eliquis metoprolol well-controlled  BPH has not suprapubic catheter.  On finasteride and tamsulosin.  Decreased mobility due to degenerative joint disease, spine surgery and cardiac condition  AKI on CKD resolved  Diabetes mellitus  COPD on inhalers and Solu-Medrol which was increased to 40 mg every 12 today.  Discussed the management with the care team.

## 2020-10-19 NOTE — Progress Notes (Signed)
*  PRELIMINARY RESULTS* Echocardiogram Echocardiogram Transesophageal has been performed.  [name] 10/19/2020, 10:00 AM

## 2020-10-20 ENCOUNTER — Inpatient Hospital Stay: Payer: No Typology Code available for payment source

## 2020-10-20 DIAGNOSIS — I480 Paroxysmal atrial fibrillation: Secondary | ICD-10-CM | POA: Diagnosis not present

## 2020-10-20 DIAGNOSIS — N179 Acute kidney failure, unspecified: Secondary | ICD-10-CM | POA: Diagnosis not present

## 2020-10-20 DIAGNOSIS — B955 Unspecified streptococcus as the cause of diseases classified elsewhere: Secondary | ICD-10-CM | POA: Diagnosis not present

## 2020-10-20 DIAGNOSIS — J441 Chronic obstructive pulmonary disease with (acute) exacerbation: Secondary | ICD-10-CM | POA: Diagnosis not present

## 2020-10-20 DIAGNOSIS — A408 Other streptococcal sepsis: Secondary | ICD-10-CM | POA: Diagnosis not present

## 2020-10-20 DIAGNOSIS — R7881 Bacteremia: Secondary | ICD-10-CM | POA: Diagnosis not present

## 2020-10-20 LAB — GLUCOSE, CAPILLARY
Glucose-Capillary: 158 mg/dL — ABNORMAL HIGH (ref 70–99)
Glucose-Capillary: 150 mg/dL — ABNORMAL HIGH (ref 70–99)

## 2020-10-20 MED ORDER — BENZONATATE 100 MG PO CAPS
100.0000 mg | ORAL_CAPSULE | Freq: Three times a day (TID) | ORAL | Status: DC
  Administered 2020-10-20 – 2020-11-01 (×35): 100 mg via ORAL
  Filled 2020-10-20 (×35): qty 1

## 2020-10-20 MED ORDER — INSULIN ASPART 100 UNIT/ML IJ SOLN
0.0000 [IU] | Freq: Every day | INTRAMUSCULAR | Status: DC
  Administered 2020-10-22: 2 [IU] via SUBCUTANEOUS
  Administered 2020-10-22: 3 [IU] via SUBCUTANEOUS
  Administered 2020-10-23: 2 [IU] via SUBCUTANEOUS
  Administered 2020-10-28 – 2020-10-30 (×3): 3 [IU] via SUBCUTANEOUS
  Filled 2020-10-20 (×5): qty 1

## 2020-10-20 MED ORDER — INSULIN ASPART 100 UNIT/ML IJ SOLN
0.0000 [IU] | Freq: Three times a day (TID) | INTRAMUSCULAR | Status: DC
  Administered 2020-10-20 – 2020-10-21 (×3): 2 [IU] via SUBCUTANEOUS
  Administered 2020-10-21: 3 [IU] via SUBCUTANEOUS
  Administered 2020-10-22: 5 [IU] via SUBCUTANEOUS
  Administered 2020-10-22: 2 [IU] via SUBCUTANEOUS
  Administered 2020-10-22: 5 [IU] via SUBCUTANEOUS
  Administered 2020-10-23: 3 [IU] via SUBCUTANEOUS
  Administered 2020-10-23: 7 [IU] via SUBCUTANEOUS
  Administered 2020-10-24: 2 [IU] via SUBCUTANEOUS
  Administered 2020-10-24: 3 [IU] via SUBCUTANEOUS
  Administered 2020-10-25: 1 [IU] via SUBCUTANEOUS
  Administered 2020-10-25 – 2020-10-26 (×5): 2 [IU] via SUBCUTANEOUS
  Administered 2020-10-27: 3 [IU] via SUBCUTANEOUS
  Administered 2020-10-28: 2 [IU] via SUBCUTANEOUS
  Administered 2020-10-28: 3 [IU] via SUBCUTANEOUS
  Filled 2020-10-20 (×20): qty 1

## 2020-10-20 NOTE — NC FL2 (Signed)
[place] MEDICAID FL2 LEVEL OF CARE SCREENING TOOL     IDENTIFICATION  Patient Name: Raymond Hays Birthdate: 08/26/1945 Sex: male Admission Date (Current Location): 10/16/2020  [place] and [place] Number:  Chiropodist and Address:  [facility], [address] [place], [place] [address]      Provider Number: [id number]  Attending Physician Name and Address:  Alford Highland, MD  Relative Name and Phone Number:  [name], [phone]    Current Level of Care: Hospital Recommended Level of Care: Skilled Nursing Facility Prior Approval Number:    Date Approved/Denied:   PASRR Number: [id number]  Discharge Plan: SNF    Current Diagnoses: Patient Active Problem List   Diagnosis Date Noted   Neck pain    Acute kidney injury superimposed on CKD [facility])    Type 2 diabetes mellitus with diabetic neuropathy, without long-term current use of insulin (HCC)    Parkinson disease (HCC)    Sepsis (HCC) 10/16/2020   Chronic respiratory failure with hypoxia (HCC) 01/10/2020   Impaired functional mobility, balance, gait, and endurance 09/15/2019   Catheter cystitis (HCC) 10/16/2018   COVID-19 virus detected 07/03/2018   Benign prostatic hyperplasia with lower urinary tract symptoms 08/06/2017   Osteoarthritis of right knee 06/19/2017   S/P orthopedic surgery, follow-up exam 10/25/2016   Tendon rupture of wrist, sequela 01/02/2016   Acute pain of right knee 08/03/2015   Calcific tendinitis of left shoulder 07/05/2015   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/05/2015   Gait difficulty 06/26/2015   Severe mitral regurgitation 06/05/2015   AF (paroxysmal atrial fibrillation) (HCC) 05/31/2015   Chronic pain 05/31/2015   COPD with acute exacerbation (HCC) 05/31/2015   Essential hypertension 05/31/2015   Chronic pain of left wrist 04/24/2015   Arthritis of left wrist 04/24/2015   Rupture of extensor tendon of left hand 04/24/2015     Orientation RESPIRATION BLADDER Height & Weight     Self, Time, Situation, Place  Normal Continent Weight: 103 kg Height:     BEHAVIORAL SYMPTOMS/MOOD NEUROLOGICAL BOWEL NUTRITION STATUS      Continent Diet (regular)  AMBULATORY STATUS COMMUNICATION OF NEEDS Skin   Extensive Assist Verbally Normal                       Personal Care Assistance Level of Assistance  Bathing, Dressing Bathing Assistance: Limited assistance   Dressing Assistance: Limited assistance     Functional Limitations Info             SPECIAL CARE FACTORS FREQUENCY  PT (By licensed PT)     PT Frequency: 5 times per week              Contractures Contractures Info: Not present    Additional Factors Info  Code Status, Allergies Code Status Info: Full code Allergies Info: NKDA           Current Medications (10/20/2020):  This is the current hospital active medication list Current Facility-Administered Medications  Medication Dose Route Frequency Provider Last Rate Last Admin   acetaminophen (TYLENOL) tablet 650 mg  650 mg Oral Q6H PRN Mansy, Jan A, MD       Or   acetaminophen (TYLENOL) suppository 650 mg  650 mg Rectal Q6H PRN Mansy, Jan A, MD       apixaban Everlene Balls) tablet 5 mg  5 mg Oral BID Mansy, Jan A, MD   5 mg at 10/19/20 2055   ascorbic acid (VITAMIN C)  tablet 500 mg  500 mg Oral Daily Mansy, Jan A, MD   500 mg at 10/19/20 1227   bisacodyl (DULCOLAX) suppository 10 mg  10 mg Rectal PRN Mansy, Jan A, MD       budesonide (PULMICORT) nebulizer solution 0.5 mg  0.5 mg Nebulization BID Alford Highland, MD   0.5 mg at 10/20/20 1610   calcium carbonate (TUMS - dosed in mg elemental calcium) chewable tablet 400 mg of elemental calcium  2 tablet Oral Q6H PRN Mansy, Jan A, MD       carbidopa-levodopa (SINEMET IR) 25-100 MG per tablet immediate release 1 tablet  1 tablet Oral TID Mansy, Jan A, MD   1 tablet at 10/20/20 0600   cefTRIAXone (ROCEPHIN) 2 g in sodium chloride 0.9 % 100 mL  IVPB  2 g Intravenous Q24H Mansy, Jan A, MD 200 mL/hr at 10/19/20 2106 2 g at 10/19/20 2106   Chlorhexidine Gluconate Cloth 2 % PADS 6 each  6 each Topical Daily Mansy, Jan A, MD   6 each at 10/19/20 1208   chlorpheniramine-HYDROcodone (TUSSIONEX) 10-8 MG/5ML suspension 5 mL  5 mL Oral Q12H PRN Mansy, Jan A, MD   5 mL at 10/20/20 0600   escitalopram (LEXAPRO) tablet 5 mg  5 mg Oral Daily Mansy, Jan A, MD   5 mg at 10/19/20 1226   feeding supplement (ENSURE ENLIVE / ENSURE PLUS) liquid 237 mL  237 mL Oral BID BM Sreenath, Sudheer B, MD   237 mL at 10/19/20 1228   finasteride (PROSCAR) tablet 5 mg  5 mg Oral Daily Mansy, Jan A, MD   5 mg at 10/19/20 1227   fluticasone (FLONASE) 50 MCG/ACT nasal spray 1 spray  1 spray Each Nare Daily Mansy, Jan A, MD   1 spray at 10/19/20 1224   furosemide (LASIX) tablet 20 mg  20 mg Oral Daily Wieting, Richard, MD   20 mg at 10/19/20 1227   gabapentin (NEURONTIN) capsule 800 mg  800 mg Oral BID Mansy, Jan A, MD   800 mg at 10/19/20 2055   guaiFENesin (MUCINEX) 12 hr tablet 600 mg  600 mg Oral Q12H Mansy, Jan A, MD   600 mg at 10/19/20 2055   hydrOXYzine (ATARAX/VISTARIL) tablet 25 mg  25 mg Oral TID PRN Mansy, Jan A, MD       ipratropium (ATROVENT) 0.03 % nasal spray 1 spray  1 spray Each Nare BID Mansy, Jan A, MD   1 spray at 10/19/20 2107   ipratropium-albuterol (DUONEB) 0.5-2.5 (3) MG/3ML nebulizer solution 3 mL  3 mL Nebulization TID Lolita Patella B, MD   3 mL at 10/20/20 0733   ketorolac (TORADOL) 15 MG/ML injection 15 mg  15 mg Intravenous Q6H Sreenath, Sudheer B, MD   15 mg at 10/20/20 0600   lactase (LACTAID) tablet 3,000 Units  3,000 Units Oral TID WC Mansy, Jan A, MD   3,000 Units at 10/19/20 1713   loperamide (IMODIUM) capsule 4 mg  4 mg Oral PRN Mansy, Jan A, MD   4 mg at 10/18/20 0845   loratadine (CLARITIN) tablet 10 mg  10 mg Oral Daily Mansy, Jan A, MD   10 mg at 10/19/20 1226   magnesium hydroxide (MILK OF MAGNESIA) suspension 30 mL  30 mL Oral  Daily PRN Mansy, Jan A, MD       methocarbamol (ROBAXIN) tablet 1,000 mg  1,000 mg Oral Q8H PRN Mansy, Jan A, MD   1,000 mg at 10/18/20 [phone]  methylPREDNISolone sodium succinate (SOLU-MEDROL) 40 mg/mL injection 40 mg  40 mg Intravenous Q12H Wieting, Richard, MD       metoprolol succinate (TOPROL-XL) 24 hr tablet 50 mg  50 mg Oral Daily Mansy, Jan A, MD   50 mg at 10/19/20 1226   montelukast (SINGULAIR) tablet 10 mg  10 mg Oral QHS Mansy, Jan A, MD   10 mg at 10/19/20 2055   morphine 2 MG/ML injection 2 mg  2 mg Intravenous Q2H PRN Mansy, Jan A, MD   2 mg at 10/17/20 0814   ondansetron (ZOFRAN) tablet 4 mg  4 mg Oral Q6H PRN Mansy, Jan A, MD       Or   ondansetron [facility]) injection 4 mg  4 mg Intravenous Q6H PRN Mansy, Jan A, MD       senna-docusate (Senokot-S) tablet 1 tablet  1 tablet Oral QHS PRN Mansy, Jan A, MD       tamsulosin [facility]) capsule 0.4 mg  0.4 mg Oral Daily Mansy, Jan A, MD   0.4 mg at 10/19/20 1227   traMADol (ULTRAM) tablet 100 mg  100 mg Oral TID PRN Lolita Patella B, MD   100 mg at 10/18/20 0846   traZODone (DESYREL) tablet 100 mg  100 mg Oral QHS Mansy, Jan A, MD   100 mg at 10/19/20 2055     Discharge Medications: Please see discharge summary for a list of discharge medications.  Relevant Imaging Results:  Relevant Lab Results:   Additional Information [id number] SS#  Barrie Dunker, RN

## 2020-10-20 NOTE — TOC Progression Note (Signed)
Transition of Care [facility]) - Progression Note    Patient Details  Name: Raymond Hays MRN: [id number] Date of Birth: 25-Jul-1945  Transition of Care [facility]) CM/SW Contact  Barrie Dunker, RN Phone Number: 10/20/2020, 2:54 PM  Clinical Narrative:    The patient's son requested a call, I went to the patient to ask permission to call his son. He asked for what., I explained his son was wanting to look at other options for him to go to a different facility other than [facility],  The patient stated that he lives at [facility] and that is where he is going back to and that his son needs to mind his own business.  He stated that he would handle his son and that I did not need to call him.  He plans to return to [facility] He asked that I let his nurse know that he needs something for a cough and he feels that  he is urinating around the Foley Cath everytime he coughs.  I notified his Nurse of this.        Expected Discharge Plan and Services                                                 Social Determinants of Health (SDOH) Interventions    Readmission Risk Interventions Readmission Risk Prevention Plan 10/20/2020  Transportation Screening Complete  PCP or Specialist Appt within 3-5 Days Complete  HRI or Home Care Consult Complete  Palliative Care Screening Not Applicable  Medication Review (RN Care Manager) Referral to Pharmacy

## 2020-10-20 NOTE — Progress Notes (Signed)
Date of Admission:  10/16/2020    ID: Raymond Hays is a 75 y.o. male wActive Problems:   AF (paroxysmal atrial fibrillation) (HCC)   Acute pain of right knee   COPD with acute exacerbation (HCC)   Sepsis (HCC)   Neck pain   Acute kidney injury superimposed on CKD (HCC)   Type 2 diabetes mellitus with diabetic neuropathy, without long-term current use of insulin (HCC)   Parkinson disease (HCC)    Subjective: Patient says neck pain is better but still present No fever Right elbow and right knee pain much better Still has some cough   Medications:   apixaban  5 mg Oral BID   ascorbic acid  500 mg Oral Daily   benzonatate  100 mg Oral TID   budesonide (PULMICORT) nebulizer solution  0.5 mg Nebulization BID   carbidopa-levodopa  1 tablet Oral TID   Chlorhexidine Gluconate Cloth  6 each Topical Daily   escitalopram  5 mg Oral Daily   feeding supplement  237 mL Oral BID BM   finasteride  5 mg Oral Daily   fluticasone  1 spray Each Nare Daily   furosemide  20 mg Oral Daily   gabapentin  800 mg Oral BID   guaiFENesin  600 mg Oral Q12H   insulin aspart  0-5 Units Subcutaneous QHS   insulin aspart  0-9 Units Subcutaneous TID WC   ipratropium  1 spray Each Nare BID   ipratropium-albuterol  3 mL Nebulization TID   ketorolac  15 mg Intravenous Q6H   lactase  3,000 Units Oral TID WC   loratadine  10 mg Oral Daily   methylPREDNISolone (SOLU-MEDROL) injection  40 mg Intravenous Q12H   metoprolol succinate  50 mg Oral Daily   montelukast  10 mg Oral QHS   tamsulosin  0.4 mg Oral Daily   traZODone  100 mg Oral QHS    Objective: Vital signs in last 24 hours: Temp:  [97.6 F (36.4 C)-98.6 F (37 C)] 98.6 F (37 C) (07/22 1558) Pulse Rate:  [65-78] 65 (07/22 1558) Resp:  [16-18] 16 (07/22 1558) BP: (126-160)/(63-92) 126/80 (07/22 1558) SpO2:  [93 %-99 %] 93 % (07/22 1558)  PHYSICAL EXAM:  General: Alert, cooperative, no distress, appears stated age.  Head: Normocephalic,  without obvious abnormality, atraumatic. Eyes: Conjunctivae clear, anicteric sclerae. Pupils are equal ENT Nares normal. No drainage or sinus tenderness. Lips, mucosa, and tongue normal. No Thrush Neck: Supple, symmetrical, no adenopathy, thyroid: non tender no carotid bruit and no JVD. Back: No CVA tenderness. Lungs: Bilateral air entry. Rhonchi present Heart: Irregular.  Rate controlled Abdomen: Soft, suprapubic catheter  Extremities: Edema arms and legs Skin: Areas of excoriations over the hands Lymph: Cervical, supraclavicular normal. Neurologic: Grossly non-focal  Lab Results Recent Labs    10/19/20 0112  WBC 13.0*  HGB 10.0*  HCT 30.9*  NA 135  K 5.1  CL 104  CO2 24  BUN 54*  CREATININE 1.19   Liver Panel No results for input(s): PROT, ALBUMIN, AST, ALT, ALKPHOS, BILITOT, BILIDIR, IBILI in the last 72 hours. Sedimentation Rate Recent Labs    10/19/20 0112  ESRSEDRATE 65*   C-Reactive Protein Recent Labs    10/19/20 0112  CRP 21.0*    Microbiology:  Studies/Results: DG Chest Port 1 View  Result Date: 10/20/2020 CLINICAL DATA:  Cough. EXAM: PORTABLE CHEST 1 VIEW COMPARISON:  October 16, 2020. FINDINGS: The heart size and mediastinal contours are within normal limits. Both lungs are  clear. The visualized skeletal structures are unremarkable. IMPRESSION: No active disease. Electronically Signed   By: Lupita Raider M.D.   On: 10/20/2020 09:56   ECHO TEE  Result Date: 10/19/2020    TRANSESOPHOGEAL ECHO REPORT   Patient Name:   Raymond Hays Date of Exam: 10/19/2020 Medical Rec #:  [id number]  Height:       67.0 in Accession #:    [id number] Weight:       227.0 lb Date of Birth:  [birth year]  BSA:          2.134 m Patient Age:    74 years   BP:           105/73 mmHg Patient Gender: M          HR:           73 bpm. Exam Location:  [facility] Procedure: Transesophageal Echo, Cardiac Doppler and Color Doppler Indications:     Bacteremia R78.81  History:         Patient has prior  history of Echocardiogram examinations, most                  recent 10/18/2020. COPD; Risk Factors:Diabetes and Hypertension.                   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  Sonographer:     [name] RDCS (AE) Referring Phys:  [id number] CHRISTOPHER RONALD BERGE Diagnosing Phys: Julien Nordmann MD PROCEDURE: The transesophogeal probe was passed without difficulty through the esophogus of the patient. Imaged were obtained with the patient in a left lateral decubitus position. Local oropharyngeal anesthetic was provided with Benzocaine spray and Cetacaine. Sedation performed by different physician. The patient's vital signs; including heart rate, blood pressure, and oxygen saturation; remained stable throughout the procedure. The patient developed no complications during the procedure. IMPRESSIONS  1. No valve vegetation noted.  2. Left ventricular ejection fraction, by estimation, is 60 to 65%. The left ventricle has normal function. The left ventricle has no regional wall motion abnormalities.  3. Right ventricular systolic function is normal. The right ventricular size is moderately enlarged.  4. Left atrial size was severely dilated. No left atrial/left atrial appendage thrombus was detected.  5. The mitral valve is normal in structure. Mild to moderate mitral valve regurgitation. No evidence of mitral stenosis. 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 Echo findings are consistent with normal structure and function of the mitral valve prosthesis.  6. Tricuspid valve regurgitation is moderate.  7. Evidence of atrial level shunting detected by color flow Doppler. There is a small to moderately sized atrial septal defect. Conclusion(s)/Recommendation(s): Normal biventricular function without evidence of hemodynamically significant valvular heart disease. FINDINGS  Left Ventricle: Left ventricular ejection fraction, by estimation, is 60 to 65%. The left  ventricle has normal function. The left ventricle has no regional wall motion abnormalities. The left ventricular internal cavity size was normal in size. There is  no left ventricular hypertrophy. Right Ventricle: The right ventricular size is moderately enlarged. No increase in right ventricular wall thickness. Right ventricular systolic function is normal. Left Atrium: Left atrial size was severely dilated. No left atrial/left atrial appendage thrombus was detected. Right Atrium: Right atrial size was normal in size. Pericardium: There is no evidence of pericardial effusion. Mitral Valve: The mitral valve is normal  in structure. Mild to moderate mitral valve regurgitation. 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 Echo findings are consistent with normal structure and function of the mitral valve prosthesis. No evidence of mitral valve stenosis. Tricuspid Valve: The tricuspid valve is normal in structure. Tricuspid valve regurgitation is moderate . No evidence of tricuspid stenosis. Aortic Valve: The aortic valve is normal in structure. Aortic valve regurgitation is not visualized. Mild aortic valve sclerosis is present, with no evidence of aortic valve stenosis. Pulmonic Valve: The pulmonic valve was normal in structure. Pulmonic valve regurgitation is not visualized. No evidence of pulmonic stenosis. Aorta: The aortic root is normal in size and structure. Venous: The inferior vena cava is normal in size with greater than 50% respiratory variability, suggesting right atrial pressure of 3 mmHg. IAS/Shunts: The interatrial septum is aneurysmal. Evidence of atrial level shunting detected by color flow Doppler. There is a moderately sized atrial septal defect. Julien Nordmann MD Electronically signed by Julien Nordmann MD Signature Date/Time: 10/19/2020/3:02:49 PM    Final (Updated)      Assessment/Plan:  Streptococcus group C bacteremia.  Endocarditis ruled out by TEE. Patient also has cervical spine  hardware and is complaining of pain.  He underwent a CT of the cervical spine with no evidence of infection.  Patient is currently on ceftriaxone.  I am erring towards the side of treating him with 4 weeks course of antibiotics because of multiple risk factors for deep infection including cervical spine and mitral valve clips.  New right elbow and right knee pain much better.  Seen by orthopedic surgeon and no evidence of septic arthritis  Mitral regurgitation/CHF.  He needed mitral valve repair with clips.  Also on diuretic.  Paroxysmal A. fib on Eliquis. Metoprolol  BPH has a suprapubic catheter.  On finasteride and tamsulosin  Decreased mobility due to degenerative joint disease, spine surgery and cardiac condition  AKI on CKD resolved  Diabetes mellitus  COPD on inhalers and Solu-Medrol  ID will follow him peripherally this weekend call if needed.  OPAT note entered.

## 2020-10-20 NOTE — Progress Notes (Signed)
Occupational Therapy Treatment Patient Details Name: Raymond Hays MRN: [id number] DOB: 15-Sep-1945 Today's Date: 10/20/2020    History of present illness Raymond Hays is a 74yoM who comes to [facility] on 7/18 from [facility] with 4 days weakness, DOE. PMH: DM2, HTN, COPD, GAD, PND, PAF. Pt admitted c ARI, sepsis, AKI, dehydration. At baseline pt is able to perform supervised transfers bed to/from [facility], able to perform intermittent limited self propulsion in WC. Pt reportedly could AMB up to 46ft with therapy team using a RW.   OT comments  Raymond Hays received transesophageal echocardiogram with general anesthesia yesterday; however, pt's condition remains essentially unchanged from prior to procedure, and previously established goals of care remain appropriate. Pt states this afternoon that he is having no pain at present -- a rare occasion for him. He therefore declines OOB mobility, fearing it could cause a pain flair-up. He is able to participate minimally in rolling in bed, repositioning for bedding change. Requires additional repositioning + SUPV for self-feeding. Pt reports he is overall quite satisfied with the level and quality of care he receives at [facility]. Recommend DC to this facility.    Follow Up Recommendations  SNF    Equipment Recommendations       Recommendations for Other Services      Precautions / Restrictions Precautions Precautions: Fall Restrictions Weight Bearing Restrictions: No       Mobility Bed Mobility Overal bed mobility: Needs Assistance Bed Mobility: Rolling Rolling: Max assist         General bed mobility comments: Pt declines supine<sit and OOB activity. Able to reposition in bed with Max A    Transfers Overall transfer level: Needs assistance               General transfer comment: Pt declined    Balance Overall balance assessment: Needs assistance     Sitting balance - Comments: not tested       Standing balance comment: not  tested                           ADL either performed or assessed with clinical judgement   ADL Overall ADL's : Needs assistance/impaired Eating/Feeding: Supervision/ safety;Set up;Bed level                                           Vision Patient Visual Report: No change from baseline     Perception     Praxis      Cognition Arousal/Alertness: Awake/alert Behavior During Therapy: WFL for tasks assessed/performed Overall Cognitive Status: History of cognitive impairments - at baseline                                 General Comments: Alert, oriented to self, place, situation.        Exercises Total Joint Exercises Ankle Circles/Pumps: AROM;5 reps;Both;Supine Heel Slides: AROM;Both;5 reps;Supine   Shoulder Instructions       General Comments      Pertinent Vitals/ Pain       Pain Score: 0-No pain  Home Living  Prior Functioning/Environment              Frequency  Min 1X/week        Progress Toward Goals  OT Goals(current goals can now be found in the care plan section)  Progress towards OT goals: Progressing toward goals  Acute Rehab OT Goals Patient Stated Goal: be able to perform WC transfers again, have less pain OT Goal Formulation: With patient Time For Goal Achievement: 10/31/20 Potential to Achieve Goals: Good  Plan Discharge plan remains appropriate;Frequency remains appropriate    Co-evaluation                 AM-PAC OT "6 Clicks" Daily Activity     Outcome Measure   Help from another person eating meals?: A Little Help from another person taking care of personal grooming?: A Lot Help from another person toileting, which includes using toliet, bedpan, or urinal?: Total Help from another person bathing (including washing, rinsing, drying)?: A Lot Help from another person to put on and taking off regular upper body clothing?:  A Lot Help from another person to put on and taking off regular lower body clothing?: A Lot 6 Click Score: 12    End of Session    OT Visit Diagnosis: Muscle weakness (generalized) (M62.81);Pain;Unsteadiness on feet (R26.81)   Activity Tolerance Patient tolerated treatment well   Patient Left in bed;with call bell/phone within reach;with bed alarm set;with nursing/sitter in room   Nurse Communication Other (comment) (leaking catheter, bedding change)        Time: 3614-4315 OT Time Calculation (min): 13 min  Charges: OT General Charges $OT Visit: 1 Visit OT Treatments $Self Care/Home Management : 8-22 mins  Latina Craver, PhD, MS, OTR/L 10/20/20, 4:19 PM

## 2020-10-20 NOTE — Progress Notes (Signed)
Physical Therapy Treatment Patient Details Name: Raymond Hays MRN: [id number] DOB: [birth year] Today's Date: 10/20/2020    History of Present Illness Raymond Hays is a 74yoM who comes to [facility] on 7/18 from [facility] with 4 days weakness, DOE. PMH: DM2, HTN, COPD, GAD, PND, PAF. Pt admitted c ARI, sepsis, AKI, dehydration. At baseline pt is able to perform supervised transfers bed to/from [facility], able to perform intermittent limited self propulsion in WC. Pt reportedly could AMB up to 33ft with therapy team using a RW.    PT Comments    Patient eager for participation with session, but globally limited by SOB with minimal functional activities.  Sats >90% on RA, but does require frequent rest periods and consistent cuing for pursed lip breathing throughout session. Tolerating unsupported sitting with less physical assist this date, but requires return to supine for respiratory recovery after 7-8 minutes of seated, upright activities.  Extensive physical assist required for all components of bed mobility; unable to tolerate attempts at standing or OOB this date.  Of note, per chart, patient status post TEE previous date; continued orders received.  Plan of care and goals established at initial evaluation remain appropriate for patient moving forward.    Follow Up Recommendations  Supervision for mobility/OOB;Home health PT;Supervision - Intermittent     Equipment Recommendations  None recommended by PT    Recommendations for Other Services       Precautions / Restrictions Precautions Precautions: Fall Restrictions Weight Bearing Restrictions: No    Mobility  Bed Mobility Overal bed mobility: Needs Assistance Bed Mobility: Rolling Rolling: Max assist;+2 for physical assistance   Supine to sit: Max assist;+2 for physical assistance Sit to supine: Max assist;+2 for physical assistance   General bed mobility comments: generally stiff throughout trunk/extremities; very limited active  ROM/movement bilat shoulders resulting in significant difficulty reaching/initiating truncal rotation    Transfers Overall transfer level: Needs assistance               General transfer comment: unable to tolerate due to R knee pain, fatigue/SOB with activity  Ambulation/Gait             General Gait Details: unable to tolerate due to R knee pain, fatigue/SOB with activity   Stairs             Wheelchair Mobility    Modified Rankin (Stroke Patients Only)       Balance Overall balance assessment: Needs assistance Sitting-balance support: No upper extremity supported;Feet supported Sitting balance-Leahy Scale: Fair Sitting balance - Comments: close sup; limited ability to recover balance with movement outside immediate BOS       Standing balance comment: not tested                            Cognition Arousal/Alertness: Awake/alert Behavior During Therapy: WFL for tasks assessed/performed Overall Cognitive Status: Within Functional Limits for tasks assessed                                 General Comments: Alert, oriented to self, place, situation.      Exercises Total Joint Exercises Ankle Circles/Pumps: AROM;5 reps;Both;Supine Heel Slides: AROM;Both;5 reps;Supine Other Exercises Other Exercises: Unsupported sitting edge of bed, close sup/min assist x7-8 minutes to participate with grooming and upper body bathing activities.  Dep assist to reach top/back of head (due to shoulder ROM limitations); max  assist to complete upper body bathing routine (mod/max assist for chest/abdomen/UEs, dep assist for back).  Significant SOB with minimal exertional activity requiring intermittent rest periods, cuing for pursed lip breathing throughout Other Exercises: Lower body bathing and peri-care completed in supine, dep assist from therapist.  Rolling bilat, max assist.    General Comments        Pertinent Vitals/Pain Pain Assessment:  Faces Pain Score: 0-No pain Faces Pain Scale: Hurts little more Pain Location: R knee, neck Pain Descriptors / Indicators: Aching;Discomfort;Grimacing Pain Intervention(s): Limited activity within patient's tolerance;Monitored during session;Repositioned    Home Living                      Prior Function            PT Goals (current goals can now be found in the care plan section) Acute Rehab PT Goals Patient Stated Goal: be able to perform WC transfers again, have less pain PT Goal Formulation: With patient Time For Goal Achievement: 10/31/20 Potential to Achieve Goals: Fair Progress towards PT goals: Progressing toward goals    Frequency    Min 2X/week      PT Plan Current plan remains appropriate    Co-evaluation              AM-PAC PT "6 Clicks" Mobility   Outcome Measure  Help needed turning from your back to your side while in a flat bed without using bedrails?: A Lot Help needed moving from lying on your back to sitting on the side of a flat bed without using bedrails?: A Lot Help needed moving to and from a bed to a chair (including a wheelchair)?: Total Help needed standing up from a chair using your arms (e.g., wheelchair or bedside chair)?: Total Help needed to walk in hospital room?: Total Help needed climbing 3-5 steps with a railing? : Total 6 Click Score: 8    End of Session   Activity Tolerance: Patient limited by fatigue Patient left: in bed;with call bell/phone within reach;with bed alarm set Nurse Communication: Mobility status PT Visit Diagnosis: Difficulty in walking, not elsewhere classified (R26.2);Other abnormalities of gait and mobility (R26.89);Muscle weakness (generalized) (M62.81);History of falling (Z91.81)     Time: 2956-2130 PT Time Calculation (min) (ACUTE ONLY): 27 min  Charges:  $Therapeutic Activity: 23-37 mins                    Raymond Hays H. Manson Passey, PT, DPT, NCS 10/20/20, 7:42 PM [phone]

## 2020-10-20 NOTE — TOC Progression Note (Signed)
Transition of Care [facility]) - Progression Note    Patient Details  Name: Yafet Cline MRN: [id number] Date of Birth: 1945-06-06  [facility] CM/SW Contact  Barrie Dunker, RN Phone Number: 10/20/2020, 8:45 AM  Clinical Narrative:   DC plan remains for the patient to return to [facility] room [address] whee he is a long term resident, TOC will continue to monitor if he will be getting IV ABX for the next 4 weeks and will alert the facility once it is established         Expected Discharge Plan and Services                                                 Social Determinants of Health (SDOH) Interventions    Readmission Risk Interventions No flowsheet data found.

## 2020-10-20 NOTE — Progress Notes (Signed)
Patient ID: Raymond Hays, male   DOB: [birth year], 75 y.o.   MRN: [id number] [facility] PROGRESS NOTE  Kiara Keep MVE:[id number] DOB: Aug 05, 1945 DOA: 10/16/2020 PCP: [facility]  HPI/Subjective: Patient feeling better than when he came in.  His knee pain and elbow pain is less.  Still having some coughing.  States he has no problem swallowing.  Admitted with group C Streptococcus sepsis.  Objective: Vitals:   10/20/20 0749 10/20/20 1133  BP: (!) 149/88 127/63  Pulse: 71 78  Resp: 18 18  Temp: 97.7 F (36.5 C) 97.6 F (36.4 C)  SpO2: 98% 99%    Intake/Output Summary (Last 24 hours) at 10/20/2020 1444 Last data filed at 10/20/2020 1407 Gross per 24 hour  Intake 720 ml  Output 1150 ml  Net -430 ml   Filed Weights   10/16/20 1534  Weight: 103 kg    ROS: Review of Systems  Respiratory:  Positive for cough and shortness of breath.   Cardiovascular:  Negative for chest pain.  Gastrointestinal:  Negative for abdominal pain, nausea and vomiting.  Exam: Physical Exam HENT:     Head: Normocephalic.     Mouth/Throat:     Pharynx: No oropharyngeal exudate.  Eyes:     General: Lids are normal.     Conjunctiva/sclera: Conjunctivae normal.     Pupils: Pupils are equal, round, and reactive to light.  Cardiovascular:     Rate and Rhythm: Normal rate and regular rhythm.     Heart sounds: Normal heart sounds, S1 normal and S2 normal.  Pulmonary:     Breath sounds: Transmitted upper airway sounds present. Examination of the right-lower field reveals decreased breath sounds. Examination of the left-lower field reveals decreased breath sounds. Decreased breath sounds present. No wheezing, rhonchi or rales.  Abdominal:     Palpations: Abdomen is soft.     Tenderness: There is no abdominal tenderness.  Musculoskeletal:     Right lower leg: Swelling present.     Left lower leg: Swelling present.  Skin:    General: Skin is warm.     Comments: No areas of rash but does have  areas of secondary excoriations.  Neurological:     Mental Status: He is alert and oriented to person, place, and time.     Data Reviewed: Basic Metabolic Panel: Recent Labs  Lab 10/16/20 1536 10/17/20 0612 10/19/20 0112  NA 137 137 135  K 4.7 4.1 5.1  CL 102 104 104  CO2 24 23 24   GLUCOSE 153* 174* 208*  BUN 38* 36* 54*  CREATININE 1.61* 1.23 1.19  CALCIUM 8.8* 8.2* 8.7*  MG 1.9  --   --    Liver Function Tests: Recent Labs  Lab 10/16/20 1536 10/17/20 0612  AST 14* 9*  ALT 6 10  ALKPHOS 72 57  BILITOT 1.2 0.8  PROT 7.1 6.3*  ALBUMIN 3.2* 2.7*   CBC: Recent Labs  Lab 10/16/20 1536 10/19/20 0112  WBC 18.0* 13.0*  NEUTROABS 16.3*  --   HGB 11.4* 10.0*  HCT 35.0* 30.9*  MCV 86.8 88.3  PLT 196 217   BNP (last 3 results) Recent Labs    12/27/19 1605  BNP 102.6*     CBG: Recent Labs  Lab 10/18/20 1738  GLUCAP 199*    Recent Results (from the past 240 hour(s))  Resp Panel by RT-PCR (Flu A&B, Covid) Nasopharyngeal Swab     Status: None   Collection Time: 10/16/20  3:36 PM   Specimen:  Nasopharyngeal Swab; Nasopharyngeal(NP) swabs in vial transport medium  Result Value Ref Range Status   SARS Coronavirus 2 by RT PCR NEGATIVE NEGATIVE Final    Comment: (NOTE) SARS-CoV-2 target nucleic acids are NOT DETECTED.  The SARS-CoV-2 RNA is generally detectable in upper respiratory specimens during the acute phase of infection. The lowest concentration of SARS-CoV-2 viral copies this assay can detect is 138 copies/mL. A negative result does not preclude SARS-Cov-2 infection and should not be used as the sole basis for treatment or other patient management decisions. A negative result may occur with  improper specimen collection/handling, submission of specimen other than nasopharyngeal swab, presence of viral mutation(s) within the areas targeted by this assay, and inadequate number of viral copies(<138 copies/mL). A negative result must be combined  with clinical observations, patient history, and epidemiological information. The expected result is Negative.  Fact Sheet for Patients:  BloggerCourse.comhttps://www.fda.gov/media/152166/download  Fact Sheet for Healthcare Providers:  SeriousBroker.ithttps://www.fda.gov/media/152162/download  This test is no t yet approved or cleared by the [place] FDA and  has been authorized for detection and/or diagnosis of SARS-CoV-2 by FDA under an Emergency Use Authorization (EUA). This EUA will remain  in effect (meaning this test can be used) for the duration of the COVID-19 declaration under Section 564(b)(1) of the Act, 21 U.S.C.section 360bbb-3(b)(1), unless the authorization is terminated  or revoked sooner.       Influenza A by PCR NEGATIVE NEGATIVE Final   Influenza B by PCR NEGATIVE NEGATIVE Final    Comment: (NOTE) The Xpert Xpress SARS-CoV-2/FLU/RSV plus assay is intended as an aid in the diagnosis of influenza from Nasopharyngeal swab specimens and should not be used as a sole basis for treatment. Nasal washings and aspirates are unacceptable for Xpert Xpress SARS-CoV-2/FLU/RSV testing.  Fact Sheet for Patients: BloggerCourse.comhttps://www.fda.gov/media/152166/download  Fact Sheet for Healthcare Providers: SeriousBroker.ithttps://www.fda.gov/media/152162/download  This test is not yet approved or cleared by the [place] FDA and has been authorized for detection and/or diagnosis of SARS-CoV-2 by FDA under an Emergency Use Authorization (EUA). This EUA will remain in effect (meaning this test can be used) for the duration of the COVID-19 declaration under Section 564(b)(1) of the Act, 21 U.S.C. section 360bbb-3(b)(1), unless the authorization is terminated or revoked.  Performed at [facility], [address] [place], [place] [address]   Blood culture (routine x 2)     Status: Abnormal   Collection Time: 10/16/20  7:26 PM   Specimen: BLOOD LEFT FOREARM  Result Value Ref Range Status   Specimen Description    Final    BLOOD LEFT FOREARM Performed at [facility], [address] [place], [place] [address]    Special Requests   Final    BOTTLES DRAWN AEROBIC AND ANAEROBIC Blood Culture adequate volume Performed at [facility], [address] [place], [place] [address]    Culture  Setup Time   Final    GRAM POSITIVE COCCI IN BOTH AEROBIC AND ANAEROBIC BOTTLES CRITICAL RESULT CALLED TO, READ BACK BY AND VERIFIED WITH: KRISTEN MERRIL, PHARMD AT 0830 ON 10/17/20 BY GM Performed at [facility], [address] [place], [place] [address]    Culture STREPTOCOCCUS GROUP C (A)  Final   Report Status 10/19/2020 FINAL  Final   Organism ID, Bacteria STREPTOCOCCUS GROUP C  Final      Susceptibility   Streptococcus group c - MIC*    CLINDAMYCIN <=0.25 SENSITIVE Sensitive     AMPICILLIN <=0.25 SENSITIVE Sensitive  ERYTHROMYCIN <=0.12 SENSITIVE Sensitive     VANCOMYCIN 0.5 SENSITIVE Sensitive     CEFTRIAXONE <=0.12      LEVOFLOXACIN 1 SENSITIVE Sensitive     PENICILLIN Value in next row Sensitive      SENSITIVE0.06    * STREPTOCOCCUS GROUP C  Blood Culture ID Panel (Reflexed)     Status: Abnormal   Collection Time: 10/16/20  7:26 PM  Result Value Ref Range Status   Enterococcus faecalis NOT DETECTED NOT DETECTED Final   Enterococcus Faecium NOT DETECTED NOT DETECTED Final   Listeria monocytogenes NOT DETECTED NOT DETECTED Final   Staphylococcus species NOT DETECTED NOT DETECTED Final   Staphylococcus aureus (BCID) NOT DETECTED NOT DETECTED Final   Staphylococcus epidermidis NOT DETECTED NOT DETECTED Final   Staphylococcus lugdunensis NOT DETECTED NOT DETECTED Final   Streptococcus species DETECTED (A) NOT DETECTED Final    Comment: Not Enterococcus species, Streptococcus agalactiae, Streptococcus pyogenes, or Streptococcus pneumoniae. CRITICAL RESULT CALLED TO, READ BACK BY AND VERIFIED WITH: KRISTEN MERRIL, PHARMD AT 0830 ON 10/17/20 BY GM    Streptococcus  agalactiae NOT DETECTED NOT DETECTED Final   Streptococcus pneumoniae NOT DETECTED NOT DETECTED Final   Streptococcus pyogenes NOT DETECTED NOT DETECTED Final   A.calcoaceticus-baumannii NOT DETECTED NOT DETECTED Final   Bacteroides fragilis NOT DETECTED NOT DETECTED Final   Enterobacterales NOT DETECTED NOT DETECTED Final   Enterobacter cloacae complex NOT DETECTED NOT DETECTED Final   Escherichia coli NOT DETECTED NOT DETECTED Final   Klebsiella aerogenes NOT DETECTED NOT DETECTED Final   Klebsiella oxytoca NOT DETECTED NOT DETECTED Final   Klebsiella pneumoniae NOT DETECTED NOT DETECTED Final   Proteus species NOT DETECTED NOT DETECTED Final   Salmonella species NOT DETECTED NOT DETECTED Final   Serratia marcescens NOT DETECTED NOT DETECTED Final   Haemophilus influenzae NOT DETECTED NOT DETECTED Final   Neisseria meningitidis NOT DETECTED NOT DETECTED Final   Pseudomonas aeruginosa NOT DETECTED NOT DETECTED Final   Stenotrophomonas maltophilia NOT DETECTED NOT DETECTED Final   Candida albicans NOT DETECTED NOT DETECTED Final   Candida auris NOT DETECTED NOT DETECTED Final   Candida glabrata NOT DETECTED NOT DETECTED Final   Candida krusei NOT DETECTED NOT DETECTED Final   Candida parapsilosis NOT DETECTED NOT DETECTED Final   Candida tropicalis NOT DETECTED NOT DETECTED Final   Cryptococcus neoformans/gattii NOT DETECTED NOT DETECTED Final    Comment: Performed at [facility], [address] [place], [place] [address]  Blood culture (routine x 2)     Status: None (Preliminary result)   Collection Time: 10/16/20 11:23 PM   Specimen: BLOOD RIGHT FOREARM  Result Value Ref Range Status   Specimen Description BLOOD RIGHT FOREARM  Final   Special Requests   Final    BOTTLES DRAWN AEROBIC AND ANAEROBIC Blood Culture adequate volume   Culture   Final    NO GROWTH 4 DAYS Performed at [facility], [address] [place], [place] [address]    Report Status  PENDING  Incomplete  CULTURE, BLOOD (ROUTINE X 2) w Reflex to ID Panel     Status: None (Preliminary result)   Collection Time: 10/19/20  1:11 AM   Specimen: BLOOD RIGHT FOREARM  Result Value Ref Range Status   Specimen Description BLOOD RIGHT FOREARM  Final   Special Requests   Final    BOTTLES DRAWN AEROBIC AND ANAEROBIC Blood Culture results may not be optimal due to an excessive volume of blood received in culture bottles  Culture   Final    NO GROWTH 1 DAY Performed at [facility], [address] [place], [place] [address]    Report Status PENDING  Incomplete  CULTURE, BLOOD (ROUTINE X 2) w Reflex to ID Panel     Status: None (Preliminary result)   Collection Time: 10/19/20  1:17 AM   Specimen: BLOOD RIGHT HAND  Result Value Ref Range Status   Specimen Description BLOOD RIGHT HAND  Final   Special Requests   Final    BOTTLES DRAWN AEROBIC AND ANAEROBIC Blood Culture adequate volume   Culture   Final    NO GROWTH 1 DAY Performed at [facility], [address] [place], [place] [address]    Report Status PENDING  Incomplete     Studies: CT CERVICAL SPINE W CONTRAST  Result Date: 10/19/2020 CLINICAL DATA:  Acute neck pain.  Infection suspected. EXAM: CT CERVICAL SPINE WITH CONTRAST TECHNIQUE: Multidetector CT imaging of the cervical spine was performed during intravenous contrast administration. Multiplanar CT image reconstructions were also generated. CONTRAST:  60mL OMNIPAQUE IOHEXOL 300 MG/ML  SOLN COMPARISON:  Cervical spine radiographs 10/17/2020. Report from 08/07/2018 cervical spine CT from [facility] FINDINGS: Alignment: Cervical spine straightening.  No significant listhesis. Skull base and vertebrae: Extensive postsurgical changes from C4-5 corpectomy, C3-C6 anterior fusion, and C3-C7 posterior fusion with associated laminectomies. There is evidence of solid anterior and posterior osseous fusion at the operative levels. No lucency is seen about the  screws to suggest loosening or infection. Within limitations of artifact from the hardware and image noise, no acute fracture or destructive osseous process is identified. There is asymmetrically severe right lateral and median C1-2 arthropathy with mild basilar impression which was also described in the prior outside CT report. Soft tissues and spinal canal: No evidence of significant prevertebral soft tissue swelling or fluid within limitations of streak artifact. Poor assessment of the spinal canal due to artifact. Disc levels: Calcified pannus at C1-2 result in mild spinal stenosis. There is advanced neural foraminal stenosis on the left at C2-3, on the right at C3-4, and bilaterally at C4-5 and C5-6 due to uncovertebral and facet spurring. At C7-T1, there is moderate disc space narrowing with uncovertebral and facet spurring resulting in moderate bilateral neural foraminal stenosis. At T1-2, there is severe disc space narrowing with endplate and facet spurring resulting in moderate to severe bilateral neural foraminal stenosis. Upper chest: Clear lung apices. Other: Prominent motion artifact through the pharynx, larynx, and trachea. IMPRESSION: 1. No acute osseous abnormality identified in the cervical spine. 2. Extensive postsurgical changes from C3-C6 anterior and C3-C7 posterior fusion. 3. 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 Electronically Signed   By: Sebastian Ache M.D.   On: 10/19/2020 08:28   DG Chest Port 1 View  Result Date: 10/20/2020 CLINICAL DATA:  Cough. EXAM: PORTABLE CHEST 1 VIEW COMPARISON:  October 16, 2020. FINDINGS: The heart size and mediastinal contours are within normal limits. Both lungs are clear. The visualized skeletal structures are unremarkable. IMPRESSION: No active disease. Electronically Signed   By: Lupita Raider M.D.   On: 10/20/2020 09:56   ECHO TEE  Result Date: 10/19/2020     TRANSESOPHOGEAL ECHO REPORT   Patient Name:   CLEDIS SOHN Date of Exam: 10/19/2020 Medical Rec #:  [id number]  Height:       67.0 in Accession #:    [id number] Weight:  227.0 lb Date of Birth:  06-Oct-1945  BSA:          2.134 m Patient Age:    74 years   BP:           105/73 mmHg Patient Gender: M          HR:           73 bpm. Exam Location:  [facility] Procedure: Transesophageal Echo, Cardiac Doppler and Color Doppler Indications:     Bacteremia R78.81  History:         Patient has prior history of Echocardiogram examinations, most                  recent 10/18/2020. COPD; Risk Factors:Diabetes and Hypertension.                   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  Sonographer:     [name] RDCS (AE) Referring Phys:  [id number] CHRISTOPHER RONALD BERGE Diagnosing Phys: Julien Nordmann MD PROCEDURE: The transesophogeal probe was passed without difficulty through the esophogus of the patient. Imaged were obtained with the patient in a left lateral decubitus position. Local oropharyngeal anesthetic was provided with Benzocaine spray and Cetacaine. Sedation performed by different physician. The patient's vital signs; including heart rate, blood pressure, and oxygen saturation; remained stable throughout the procedure. The patient developed no complications during the procedure. IMPRESSIONS  1. No valve vegetation noted.  2. Left ventricular ejection fraction, by estimation, is 60 to 65%. The left ventricle has normal function. The left ventricle has no regional wall motion abnormalities.  3. Right ventricular systolic function is normal. The right ventricular size is moderately enlarged.  4. Left atrial size was severely dilated. No left atrial/left atrial appendage thrombus was detected.  5. The mitral valve is normal in structure. Mild to moderate mitral valve regurgitation. No evidence of mitral stenosis. 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 Echo findings are  consistent with normal structure and function of the mitral valve prosthesis.  6. Tricuspid valve regurgitation is moderate.  7. Evidence of atrial level shunting detected by color flow Doppler. There is a small to moderately sized atrial septal defect. Conclusion(s)/Recommendation(s): Normal biventricular function without evidence of hemodynamically significant valvular heart disease. FINDINGS  Left Ventricle: Left ventricular ejection fraction, by estimation, is 60 to 65%. The left ventricle has normal function. The left ventricle has no regional wall motion abnormalities. The left ventricular internal cavity size was normal in size. There is  no left ventricular hypertrophy. Right Ventricle: The right ventricular size is moderately enlarged. No increase in right ventricular wall thickness. Right ventricular systolic function is normal. Left Atrium: Left atrial size was severely dilated. No left atrial/left atrial appendage thrombus was detected. Right Atrium: Right atrial size was normal in size. Pericardium: There is no evidence of pericardial effusion. Mitral Valve: The mitral valve is normal in structure. Mild to moderate mitral valve regurgitation. 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 Echo findings are consistent with normal structure and function of the mitral valve prosthesis. No evidence of mitral valve stenosis. Tricuspid Valve: The tricuspid valve is normal in structure. Tricuspid valve regurgitation is moderate . No evidence of tricuspid stenosis. Aortic Valve: The aortic valve is normal in structure. Aortic valve regurgitation is not visualized. Mild aortic valve sclerosis is present, with no evidence  of aortic valve stenosis. Pulmonic Valve: The pulmonic valve was normal in structure. Pulmonic valve regurgitation is not visualized. No evidence of pulmonic stenosis. Aorta: The aortic root is normal in size and structure. Venous: The inferior vena cava is normal in size with greater  than 50% respiratory variability, suggesting right atrial pressure of 3 mmHg. IAS/Shunts: The interatrial septum is aneurysmal. Evidence of atrial level shunting detected by color flow Doppler. There is a moderately sized atrial septal defect. Julien Nordmann MD Electronically signed by Julien Nordmann MD Signature Date/Time: 10/19/2020/3:02:49 PM    Final (Updated)     Scheduled Meds:  apixaban  5 mg Oral BID   ascorbic acid  500 mg Oral Daily   budesonide (PULMICORT) nebulizer solution  0.5 mg Nebulization BID   carbidopa-levodopa  1 tablet Oral TID   Chlorhexidine Gluconate Cloth  6 each Topical Daily   escitalopram  5 mg Oral Daily   feeding supplement  237 mL Oral BID BM   finasteride  5 mg Oral Daily   fluticasone  1 spray Each Nare Daily   furosemide  20 mg Oral Daily   gabapentin  800 mg Oral BID   guaiFENesin  600 mg Oral Q12H   insulin aspart  0-5 Units Subcutaneous QHS   insulin aspart  0-9 Units Subcutaneous TID WC   ipratropium  1 spray Each Nare BID   ipratropium-albuterol  3 mL Nebulization TID   ketorolac  15 mg Intravenous Q6H   lactase  3,000 Units Oral TID WC   loratadine  10 mg Oral Daily   methylPREDNISolone (SOLU-MEDROL) injection  40 mg Intravenous Q12H   metoprolol succinate  50 mg Oral Daily   montelukast  10 mg Oral QHS   tamsulosin  0.4 mg Oral Daily   traZODone  100 mg Oral QHS   Continuous Infusions:  cefTRIAXone (ROCEPHIN)  IV 2 g (10/19/20 2106)    Assessment/Plan:  Clinical sepsis, present on admission.  Group C streptococcus growing out of blood cultures.  Repeat blood cultures negative.  Will need 4 weeks of IV antibiotics.  Will need PICC line closer to the time of discharge.  Currently on Rocephin.  Orthopedics did not think the right knee and elbow was septic.  TEE negative for endocarditis. COPD exacerbation.  Continue IV Solu-Medrol and budesonide nebulizers and DuoNeb nebulizers.  Repeat chest x-ray negative.  Upper airway congestion. Right  knee pain, right elbow pain and neck pain.  Joint pains have improved with IV antibiotics.  CT scan of the cervical spine negative for osteomyelitis. Paroxysmal atrial fibrillation.  Currently in normal sinus rhythm.  On Eliquis for anticoagulation and metoprolol for rate control. Acute kidney injury on chronic kidney disease stage II.  Creatinine 1.61 on presentation down to 1.19 yesterday.  Repeat labs tomorrow. 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  Sliding scale insulin restarted secondary to sugars being elevated with increase in steroids. Parkinson's disease on Sinemet Essential hypertension on Toprol-XL Weakness.        Code Status:     Code Status Orders  (From admission, onward)           Start     Ordered   10/16/20 1956  Full code  Continuous        10/16/20 2006           Code Status History     This patient has a current code status but no historical code status.      Family  Communication: Patient's son called and requested a phone call. Disposition Plan: Status is: Inpatient  Dispo: The patient is from: Rehab              Anticipated d/c is to: Rehab              Patient currently still having some upper airway congestion but better than yesterday.  On antibiotics for group C Streptococcus sepsis   Difficult to place patient.  No.  Consultants: Infectious disease  Antibiotics: Rocephin  Time spent: 27 minutes  [employer]

## 2020-10-20 NOTE — Treatment Plan (Addendum)
Diagnosis: Group C streptococcus bacteremia Baseline Creatinine 0.87    No Known Allergies  OPAT Orders Discharge antibiotics: ceftriaxone 2 grams IV q 12 hours until 12/03/20  [facility] Care Per Protocol:including placement of biopatch  Labs weekly on Monday while on IV antibiotics:  CBC with differential  CMP  ESR CRP    _X_ Please pull PIC at completion of IV antibiotics _  Fax weekly labs to [phone]  Clinic Follow Up Appt: 8/25//22 at 11am with Dr.Velvet Moomaw   Call [phone] with any questions

## 2020-10-21 ENCOUNTER — Inpatient Hospital Stay: Payer: No Typology Code available for payment source

## 2020-10-21 DIAGNOSIS — J441 Chronic obstructive pulmonary disease with (acute) exacerbation: Secondary | ICD-10-CM | POA: Diagnosis not present

## 2020-10-21 DIAGNOSIS — K567 Ileus, unspecified: Secondary | ICD-10-CM | POA: Diagnosis not present

## 2020-10-21 DIAGNOSIS — M542 Cervicalgia: Secondary | ICD-10-CM | POA: Diagnosis not present

## 2020-10-21 DIAGNOSIS — A408 Other streptococcal sepsis: Secondary | ICD-10-CM | POA: Diagnosis not present

## 2020-10-21 LAB — BASIC METABOLIC PANEL
Anion gap: 7 (ref 5–15)
BUN: 46 mg/dL — ABNORMAL HIGH (ref 8–23)
CO2: 23 mmol/L (ref 22–32)
Calcium: 8.6 mg/dL — ABNORMAL LOW (ref 8.9–10.3)
Chloride: 106 mmol/L (ref 98–111)
Creatinine, Ser: 0.96 mg/dL (ref 0.61–1.24)
GFR, Estimated: >60 mL/min (ref >60)
Glucose, Bld: 228 mg/dL — ABNORMAL HIGH (ref 70–99)
Potassium: 4.9 mmol/L (ref 3.5–5.1)
Sodium: 136 mmol/L (ref 135–145)

## 2020-10-21 LAB — CULTURE, BLOOD (ROUTINE X 2)
Culture: NO GROWTH
Special Requests: ADEQUATE

## 2020-10-21 LAB — CBC
HCT: 35.2 % — ABNORMAL LOW (ref 39.0–52.0)
Hemoglobin: 11.4 g/dL — ABNORMAL LOW (ref 13.0–17.0)
MCH: 27.8 pg (ref 26.0–34.0)
MCHC: 32.4 g/dL (ref 30.0–36.0)
MCV: 85.9 fL (ref 80.0–100.0)
Platelets: 311 10*3/uL (ref 150–400)
RBC: 4.1 MIL/uL — ABNORMAL LOW (ref 4.22–5.81)
RDW: 15.3 % (ref 11.5–15.5)
WBC: 9.9 10*3/uL (ref 4.0–10.5)
nRBC: 0 % (ref 0.0–0.2)

## 2020-10-21 LAB — GLUCOSE, CAPILLARY
Glucose-Capillary: 203 mg/dL — ABNORMAL HIGH (ref 70–99)
Glucose-Capillary: 159 mg/dL — ABNORMAL HIGH (ref 70–99)
Glucose-Capillary: 155 mg/dL — ABNORMAL HIGH (ref 70–99)
Glucose-Capillary: 107 mg/dL — ABNORMAL HIGH (ref 70–99)

## 2020-10-21 MED ORDER — SODIUM CHLORIDE 0.9 % IV SOLN: INTRAVENOUS | Status: DC

## 2020-10-21 MED ORDER — SODIUM CHLORIDE 0.9 % IV SOLN
6.2500 mg | Freq: Once | INTRAVENOUS | Status: AC | PRN
  Administered 2020-10-21: 6.25 mg via INTRAVENOUS
  Filled 2020-10-21: qty 0.25

## 2020-10-21 MED ORDER — METOCLOPRAMIDE HCL 5 MG/ML IJ SOLN
10.0000 mg | Freq: Once | INTRAMUSCULAR | Status: AC
  Administered 2020-10-21: 10 mg via INTRAVENOUS
  Filled 2020-10-21: qty 2

## 2020-10-21 MED ORDER — METHYLPREDNISOLONE SODIUM SUCC 40 MG IJ SOLR
40.0000 mg | Freq: Every day | INTRAMUSCULAR | Status: DC
  Administered 2020-10-22: 40 mg via INTRAVENOUS
  Filled 2020-10-21: qty 1

## 2020-10-21 NOTE — Progress Notes (Signed)
Patient ID: Raymond Hays, male   DOB: January 16, 1946, 75 y.o.   MRN: [id number] [facility] PROGRESS NOTE  [place] Whedbee SWN:[id number] DOB: 18-Mar-1946 DOA: 10/16/2020 PCP: [facility]  HPI/Subjective: Patient had nausea and vomiting this morning and found to have an ileus on abdominal x-ray.  No abdominal pain.  No bowel movement in a couple days.  States his breathing is a little bit better still coughing and leaking some urine when vomiting.  Objective: Vitals:   10/21/20 0431 10/21/20 0723  BP: (!) 162/91 (!) 155/87  Pulse: 73 83  Resp: 19 17  Temp: 99 F (37.2 C) 97.8 F (36.6 C)  SpO2: 96% 96%    Intake/Output Summary (Last 24 hours) at 10/21/2020 1610 Last data filed at 10/21/2020 1353 Gross per 24 hour  Intake 150 ml  Output 1600 ml  Net -1450 ml   Filed Weights   10/16/20 1534  Weight: 103 kg    ROS: Review of Systems  Respiratory:  Positive for cough and shortness of breath.   Cardiovascular:  Negative for chest pain.  Gastrointestinal:  Positive for constipation, nausea and vomiting. Negative for abdominal pain.  Exam: Physical Exam HENT:     Head: Normocephalic.     Mouth/Throat:     Pharynx: No oropharyngeal exudate.  Eyes:     General: Lids are normal.     Conjunctiva/sclera: Conjunctivae normal.  Cardiovascular:     Rate and Rhythm: Normal rate and regular rhythm.     Heart sounds: Normal heart sounds, S1 normal and S2 normal.  Pulmonary:     Breath sounds: Transmitted upper airway sounds present. Examination of the right-lower field reveals decreased breath sounds. Examination of the left-lower field reveals decreased breath sounds. Decreased breath sounds present. No wheezing, rhonchi or rales.  Abdominal:     General: Bowel sounds are decreased. There is distension.     Palpations: Abdomen is soft.     Tenderness: There is no abdominal tenderness.  Musculoskeletal:     Right lower leg: Swelling present.     Left lower leg: Swelling present.   Skin:    General: Skin is warm.     Findings: No rash.  Neurological:     Mental Status: He is alert and oriented to person, place, and time.     Data Reviewed: Basic Metabolic Panel: Recent Labs  Lab 10/16/20 1536 10/17/20 0612 10/19/20 0112 10/21/20 0545  NA 137 137 135 136  K 4.7 4.1 5.1 4.9  CL 102 104 104 106  CO2 24 23 24 23   GLUCOSE 153* 174* 208* 228*  BUN 38* 36* 54* 46*  CREATININE 1.61* 1.23 1.19 0.96  CALCIUM 8.8* 8.2* 8.7* 8.6*  MG 1.9  --   --   --    Liver Function Tests: Recent Labs  Lab 10/16/20 1536 10/17/20 0612  AST 14* 9*  ALT 6 10  ALKPHOS 72 57  BILITOT 1.2 0.8  PROT 7.1 6.3*  ALBUMIN 3.2* 2.7*   CBC: Recent Labs  Lab 10/16/20 1536 10/19/20 0112 10/21/20 0545  WBC 18.0* 13.0* 9.9  NEUTROABS 16.3*  --   --   HGB 11.4* 10.0* 11.4*  HCT 35.0* 30.9* 35.2*  MCV 86.8 88.3 85.9  PLT 196 217 311    BNP (last 3 results) Recent Labs    12/27/19 1605  BNP 102.6*    CBG: Recent Labs  Lab 10/18/20 1738 10/20/20 1724 10/20/20 2119 10/21/20 0725 10/21/20 1136  GLUCAP 199* 158* 150* 203*  159*    Recent Results (from the past 240 hour(s))  Resp Panel by RT-PCR (Flu A&B, Covid) Nasopharyngeal Swab     Status: None   Collection Time: 10/16/20  3:36 PM   Specimen: Nasopharyngeal Swab; Nasopharyngeal(NP) swabs in vial transport medium  Result Value Ref Range Status   SARS Coronavirus 2 by RT PCR NEGATIVE NEGATIVE Final    Comment: (NOTE) SARS-CoV-2 target nucleic acids are NOT DETECTED.  The SARS-CoV-2 RNA is generally detectable in upper respiratory specimens during the acute phase of infection. The lowest concentration of SARS-CoV-2 viral copies this assay can detect is 138 copies/mL. A negative result does not preclude SARS-Cov-2 infection and should not be used as the sole basis for treatment or other patient management decisions. A negative result may occur with  improper specimen collection/handling, submission of specimen  other than nasopharyngeal swab, presence of viral mutation(s) within the areas targeted by this assay, and inadequate number of viral copies(<138 copies/mL). A negative result must be combined with clinical observations, patient history, and epidemiological information. The expected result is Negative.  Fact Sheet for Patients:  BloggerCourse.com  Fact Sheet for Healthcare Providers:  SeriousBroker.it  This test is no t yet approved or cleared by the [place] FDA and  has been authorized for detection and/or diagnosis of SARS-CoV-2 by FDA under an Emergency Use Authorization (EUA). This EUA will remain  in effect (meaning this test can be used) for the duration of the COVID-19 declaration under Section 564(b)(1) of the Act, 21 U.S.C.section 360bbb-3(b)(1), unless the authorization is terminated  or revoked sooner.       Influenza A by PCR NEGATIVE NEGATIVE Final   Influenza B by PCR NEGATIVE NEGATIVE Final    Comment: (NOTE) The Xpert Xpress SARS-CoV-2/FLU/RSV plus assay is intended as an aid in the diagnosis of influenza from Nasopharyngeal swab specimens and should not be used as a sole basis for treatment. Nasal washings and aspirates are unacceptable for Xpert Xpress SARS-CoV-2/FLU/RSV testing.  Fact Sheet for Patients: BloggerCourse.com  Fact Sheet for Healthcare Providers: SeriousBroker.it  This test is not yet approved or cleared by the [place] FDA and has been authorized for detection and/or diagnosis of SARS-CoV-2 by FDA under an Emergency Use Authorization (EUA). This EUA will remain in effect (meaning this test can be used) for the duration of the COVID-19 declaration under Section 564(b)(1) of the Act, 21 U.S.C. section 360bbb-3(b)(1), unless the authorization is terminated or revoked.  Performed at [facility], [address]  [place], [place] [address]   Blood culture (routine x 2)     Status: Abnormal   Collection Time: 10/16/20  7:26 PM   Specimen: BLOOD LEFT FOREARM  Result Value Ref Range Status   Specimen Description   Final    BLOOD LEFT FOREARM Performed at [facility], [address], [place], [place] [address]    Special Requests   Final    BOTTLES DRAWN AEROBIC AND ANAEROBIC Blood Culture adequate volume Performed at [facility], [address], [place], [place] [address]    Culture  Setup Time   Final    GRAM POSITIVE COCCI IN BOTH AEROBIC AND ANAEROBIC BOTTLES CRITICAL RESULT CALLED TO, READ BACK BY AND VERIFIED WITH: KRISTEN MERRIL, PHARMD AT 0830 ON 10/17/20 BY GM Performed at [facility], [address], [place], [place] [address]    Culture STREPTOCOCCUS GROUP C (A)  Final   Report Status 10/19/2020 FINAL  Final  Organism ID, Bacteria STREPTOCOCCUS GROUP C  Final      Susceptibility   Streptococcus group c - MIC*    CLINDAMYCIN <=0.25 SENSITIVE Sensitive     AMPICILLIN <=0.25 SENSITIVE Sensitive     ERYTHROMYCIN <=0.12 SENSITIVE Sensitive     VANCOMYCIN 0.5 SENSITIVE Sensitive     CEFTRIAXONE <=0.12      LEVOFLOXACIN 1 SENSITIVE Sensitive     PENICILLIN Value in next row Sensitive      SENSITIVE0.06    * STREPTOCOCCUS GROUP C  Blood Culture ID Panel (Reflexed)     Status: Abnormal   Collection Time: 10/16/20  7:26 PM  Result Value Ref Range Status   Enterococcus faecalis NOT DETECTED NOT DETECTED Final   Enterococcus Faecium NOT DETECTED NOT DETECTED Final   Listeria monocytogenes NOT DETECTED NOT DETECTED Final   Staphylococcus species NOT DETECTED NOT DETECTED Final   Staphylococcus aureus (BCID) NOT DETECTED NOT DETECTED Final   Staphylococcus epidermidis NOT DETECTED NOT DETECTED Final   Staphylococcus lugdunensis NOT DETECTED NOT DETECTED Final   Streptococcus species DETECTED (A) NOT DETECTED Final    Comment: Not Enterococcus species,  Streptococcus agalactiae, Streptococcus pyogenes, or Streptococcus pneumoniae. CRITICAL RESULT CALLED TO, READ BACK BY AND VERIFIED WITH: KRISTEN MERRIL, PHARMD AT 0830 ON 10/17/20 BY GM    Streptococcus agalactiae NOT DETECTED NOT DETECTED Final   Streptococcus pneumoniae NOT DETECTED NOT DETECTED Final   Streptococcus pyogenes NOT DETECTED NOT DETECTED Final   A.calcoaceticus-baumannii NOT DETECTED NOT DETECTED Final   Bacteroides fragilis NOT DETECTED NOT DETECTED Final   Enterobacterales NOT DETECTED NOT DETECTED Final   Enterobacter cloacae complex NOT DETECTED NOT DETECTED Final   Escherichia coli NOT DETECTED NOT DETECTED Final   Klebsiella aerogenes NOT DETECTED NOT DETECTED Final   Klebsiella oxytoca NOT DETECTED NOT DETECTED Final   Klebsiella pneumoniae NOT DETECTED NOT DETECTED Final   Proteus species NOT DETECTED NOT DETECTED Final   Salmonella species NOT DETECTED NOT DETECTED Final   Serratia marcescens NOT DETECTED NOT DETECTED Final   Haemophilus influenzae NOT DETECTED NOT DETECTED Final   Neisseria meningitidis NOT DETECTED NOT DETECTED Final   Pseudomonas aeruginosa NOT DETECTED NOT DETECTED Final   Stenotrophomonas maltophilia NOT DETECTED NOT DETECTED Final   Candida albicans NOT DETECTED NOT DETECTED Final   Candida auris NOT DETECTED NOT DETECTED Final   Candida glabrata NOT DETECTED NOT DETECTED Final   Candida krusei NOT DETECTED NOT DETECTED Final   Candida parapsilosis NOT DETECTED NOT DETECTED Final   Candida tropicalis NOT DETECTED NOT DETECTED Final   Cryptococcus neoformans/gattii NOT DETECTED NOT DETECTED Final    Comment: Performed at [facility], [address] [place], [place] [address]  Blood culture (routine x 2)     Status: None   Collection Time: 10/16/20 11:23 PM   Specimen: BLOOD RIGHT FOREARM  Result Value Ref Range Status   Specimen Description BLOOD RIGHT FOREARM  Final   Special Requests   Final    BOTTLES DRAWN AEROBIC AND  ANAEROBIC Blood Culture adequate volume   Culture   Final    NO GROWTH 5 DAYS Performed at [facility], [address] [place], [place] [address]    Report Status 10/21/2020 FINAL  Final  CULTURE, BLOOD (ROUTINE X 2) w Reflex to ID Panel     Status: None (Preliminary result)   Collection Time: 10/19/20  1:11 AM   Specimen: BLOOD RIGHT FOREARM  Result Value Ref Range Status   Specimen  Description BLOOD RIGHT FOREARM  Final   Special Requests   Final    BOTTLES DRAWN AEROBIC AND ANAEROBIC Blood Culture results may not be optimal due to an excessive volume of blood received in culture bottles   Culture   Final    NO GROWTH 2 DAYS Performed at [facility], [address] [place], [place] [address]    Report Status PENDING  Incomplete  CULTURE, BLOOD (ROUTINE X 2) w Reflex to ID Panel     Status: None (Preliminary result)   Collection Time: 10/19/20  1:17 AM   Specimen: BLOOD RIGHT HAND  Result Value Ref Range Status   Specimen Description BLOOD RIGHT HAND  Final   Special Requests   Final    BOTTLES DRAWN AEROBIC AND ANAEROBIC Blood Culture adequate volume   Culture   Final    NO GROWTH 2 DAYS Performed at [facility], [address] [place], [place] [address]    Report Status PENDING  Incomplete     Studies: DG Abd 1 View  Result Date: 10/21/2020 CLINICAL DATA:  Intractable vomiting with nausea. EXAM: ABDOMEN - 1 VIEW COMPARISON:  None. FINDINGS: Supine KUB shows fairly marked gaseous distention of the stomach. There is diffuse gaseous distension of small bowel with air and stool scattered along the length of the colon. Bones are diffusely demineralized with degenerative changes in the lumbar spine. Patient is status post bilateral hip replacement. IMPRESSION: Marked gaseous distention of the stomach with diffuse gaseous distention of small bowel. Imaging features are compatible with ileus. While bowel gas pattern does not appear frankly  obstructive, follow-up imaging may be warranted. Electronically Signed   By: Kennith Center M.D.   On: 10/21/2020 05:47   DG Chest Port 1 View  Result Date: 10/20/2020 CLINICAL DATA:  Cough. EXAM: PORTABLE CHEST 1 VIEW COMPARISON:  October 16, 2020. FINDINGS: The heart size and mediastinal contours are within normal limits. Both lungs are clear. The visualized skeletal structures are unremarkable. IMPRESSION: No active disease. Electronically Signed   By: Lupita Raider M.D.   On: 10/20/2020 09:56    Scheduled Meds:  apixaban  5 mg Oral BID   ascorbic acid  500 mg Oral Daily   benzonatate  100 mg Oral TID   budesonide (PULMICORT) nebulizer solution  0.5 mg Nebulization BID   carbidopa-levodopa  1 tablet Oral TID   Chlorhexidine Gluconate Cloth  6 each Topical Daily   escitalopram  5 mg Oral Daily   feeding supplement  237 mL Oral BID BM   finasteride  5 mg Oral Daily   fluticasone  1 spray Each Nare Daily   furosemide  20 mg Oral Daily   gabapentin  800 mg Oral BID   guaiFENesin  600 mg Oral Q12H   insulin aspart  0-5 Units Subcutaneous QHS   insulin aspart  0-9 Units Subcutaneous TID WC   ipratropium  1 spray Each Nare BID   ipratropium-albuterol  3 mL Nebulization TID   ketorolac  15 mg Intravenous Q6H   lactase  3,000 Units Oral TID WC   loratadine  10 mg Oral Daily   [START ON 10/22/2020] methylPREDNISolone (SOLU-MEDROL) injection  40 mg Intravenous Daily   metoprolol succinate  50 mg Oral Daily   montelukast  10 mg Oral QHS   tamsulosin  0.4 mg Oral Daily   traZODone  100 mg Oral QHS   Continuous Infusions:  cefTRIAXone (ROCEPHIN)  IV 2 g (10/20/20 2200)  Assessment/Plan:  Ileus.  Repeat abdominal x-ray tomorrow morning.  Change diet to clear liquids.  Patient declined NG tube.   Clinical sepsis, present on admission.  Group C Streptococcus growing out of blood cultures.  Will need PICC line questions at the time of discharge will need 4 weeks of IV antibiotics.  Currently  on Rocephin. COPD exacerbation.  Change Solu-Medrol to daily dosing repeat chest x-ray negative.  Patient has upper airway congestion. Right knee pain and right elbow pain and neck pain.  Improving with antibiotics during the hospital course.  No signs of septic joints. Paroxysmal atrial fibrillation in normal sinus rhythm on Eliquis for anticoagulation metoprolol for rate control. Acute kidney injury on presentation with creatinine of 1.66 and now creatinine improved at 0.96. 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  Sliding scale insulin Parkinson disease on Sinemet Essential hypertension on Toprol-XL Weakness.  Physical therapy evaluation   Code Status:     Code Status Orders  (From admission, onward)           Start     Ordered   10/16/20 1956  Full code  Continuous        10/16/20 2006           Code Status History     This patient has a current code status but no historical code status.      Family Communication: Tried to call patient's son but his mailbox was full Disposition Plan: Status is: Inpatient  Dispo: The patient is from: Home              Anticipated d/c is to: Rehab once ileus improved              Patient currently had nausea vomiting this morning   Difficult to place patient.  No.  Consultants: Infectious disease  Antibiotics: Rocephin  Time spent: 27 minutes  Alej[employer]

## 2020-10-21 NOTE — Progress Notes (Signed)
The patient is still having brown emesis after Phenergan IV infusion Notified Dr. Para March and awaiting a new order

## 2020-10-21 NOTE — TOC Progression Note (Signed)
Transition of Care [facility]) - Progression Note    Patient Details  Name: Raymond Hays MRN: [id number] Date of Birth: February 07, 1946  [facility] of [facility]) CM/SW Contact  Bing Quarry, RN Phone Number: 10/21/2020, 1:46 PM  Clinical Narrative:  Diagnostics today to rule out ileus/bowel obstruction per provider notes. RN reported suprapubic catheter, while collecting drainage in bag, is still having backflow/leakage around stoma. PICC line remains pending. DC likely now Monday per progression. Gabriel Cirri RN CM          Expected Discharge Plan and Services                                                 Social Determinants of Health (SDOH) Interventions    Readmission Risk Interventions Readmission Risk Prevention Plan 10/20/2020  Transportation Screening Complete  PCP or Specialist Appt within 3-5 Days Complete  HRI or Home Care Consult Complete  Palliative Care Screening Not Applicable  Medication Review (RN Care Manager) Referral to Pharmacy

## 2020-10-21 NOTE — Progress Notes (Signed)
The abd. Imaging is available and notified Dr. Para March about it.

## 2020-10-21 NOTE — Progress Notes (Signed)
New order for Reglan and abd. X-ray received. Will implement orders and continue to monitor.

## 2020-10-21 NOTE — Progress Notes (Signed)
PHARMACY CONSULT NOTE FOR:  OUTPATIENT  PARENTERAL ANTIBIOTIC THERAPY (OPAT)  Indication: Group C streptococcus bacteremia Regimen: Ceftriaxone 2gm IV q24h End date: 11/13/2020  IV antibiotic discharge orders are pended. To discharging provider:  please sign these orders via discharge navigator,  Select New Orders & click on the button choice - Manage This Unsigned Work.     Thank you for allowing pharmacy to be a part of this patient's care.  Juliette Alcide, PharmD, BCPS.   Work Cell: [phone] 10/21/2020 3:48 PM

## 2020-10-22 ENCOUNTER — Inpatient Hospital Stay: Payer: No Typology Code available for payment source

## 2020-10-22 DIAGNOSIS — K567 Ileus, unspecified: Secondary | ICD-10-CM | POA: Diagnosis not present

## 2020-10-22 DIAGNOSIS — J441 Chronic obstructive pulmonary disease with (acute) exacerbation: Secondary | ICD-10-CM | POA: Diagnosis not present

## 2020-10-22 DIAGNOSIS — A408 Other streptococcal sepsis: Secondary | ICD-10-CM | POA: Diagnosis not present

## 2020-10-22 DIAGNOSIS — N179 Acute kidney failure, unspecified: Secondary | ICD-10-CM | POA: Diagnosis not present

## 2020-10-22 LAB — BASIC METABOLIC PANEL
Anion gap: 8 (ref 5–15)
BUN: 35 mg/dL — ABNORMAL HIGH (ref 8–23)
CO2: 25 mmol/L (ref 22–32)
Calcium: 8.4 mg/dL — ABNORMAL LOW (ref 8.9–10.3)
Chloride: 105 mmol/L (ref 98–111)
Creatinine, Ser: 0.85 mg/dL (ref 0.61–1.24)
GFR, Estimated: >60 mL/min (ref >60)
Glucose, Bld: 131 mg/dL — ABNORMAL HIGH (ref 70–99)
Potassium: 4.4 mmol/L (ref 3.5–5.1)
Sodium: 138 mmol/L (ref 135–145)

## 2020-10-22 LAB — GLUCOSE, CAPILLARY
Glucose-Capillary: 155 mg/dL — ABNORMAL HIGH (ref 70–99)
Glucose-Capillary: 275 mg/dL — ABNORMAL HIGH (ref 70–99)
Glucose-Capillary: 290 mg/dL — ABNORMAL HIGH (ref 70–99)
Glucose-Capillary: 229 mg/dL — ABNORMAL HIGH (ref 70–99)

## 2020-10-22 MED ORDER — POLYETHYLENE GLYCOL 3350 17 G PO PACK
17.0000 g | PACK | Freq: Two times a day (BID) | ORAL | Status: DC
  Administered 2020-10-22 – 2020-10-24 (×5): 17 g via ORAL
  Filled 2020-10-22 (×6): qty 1

## 2020-10-22 MED ORDER — PREDNISONE 20 MG PO TABS
30.0000 mg | ORAL_TABLET | Freq: Every day | ORAL | Status: DC
  Administered 2020-10-23: 30 mg via ORAL
  Filled 2020-10-22: qty 1

## 2020-10-22 MED ORDER — SENNOSIDES-DOCUSATE SODIUM 8.6-50 MG PO TABS
2.0000 | ORAL_TABLET | Freq: Every day | ORAL | Status: DC
  Administered 2020-10-22 – 2020-10-24 (×3): 2 via ORAL
  Filled 2020-10-22 (×3): qty 2

## 2020-10-22 MED ORDER — FUROSEMIDE 20 MG PO TABS
20.0000 mg | ORAL_TABLET | Freq: Every day | ORAL | Status: DC
  Administered 2020-10-22 – 2020-10-28 (×6): 20 mg via ORAL
  Filled 2020-10-22 (×6): qty 1

## 2020-10-22 NOTE — Progress Notes (Signed)
Patient ID: Raymond Hays, male   DOB: 04/01/1946, 75 y.o.   MRN: [id number] [facility] PROGRESS NOTE  Raymond Hays WUJ:[id number] DOB: 08/25/1945 DOA: 10/16/2020 PCP: [facility]  HPI/Subjective: Patient seen this morning.  Had not had a bowel movement yet.  Still having some leaking of urine through his penis and urostomy tube when coughing or vomiting.  No further vomiting.  No abdominal pain.  States his cough is better.  Admitted and found to have group C streptococcus sepsis.  Objective: Vitals:   10/22/20 0812 10/22/20 1609  BP: 139/75 102/81  Pulse: 84 73  Resp: 17 19  Temp: 98.1 F (36.7 C) 98.9 F (37.2 C)  SpO2: 97% 97%    Intake/Output Summary (Last 24 hours) at 10/22/2020 1733 Last data filed at 10/22/2020 1401 Gross per 24 hour  Intake 1254.8 ml  Output 1100 ml  Net 154.8 ml   Filed Weights   10/16/20 1534  Weight: 103 kg    ROS: Review of Systems  Respiratory:  Positive for cough. Negative for shortness of breath.   Cardiovascular:  Negative for chest pain.  Gastrointestinal:  Positive for constipation. Negative for abdominal pain, nausea and vomiting.  Exam: Physical Exam HENT:     Head: Normocephalic.     Mouth/Throat:     Pharynx: No oropharyngeal exudate.  Eyes:     General: Lids are normal.     Conjunctiva/sclera: Conjunctivae normal.  Cardiovascular:     Rate and Rhythm: Normal rate and regular rhythm.     Heart sounds: Normal heart sounds, S1 normal and S2 normal.  Pulmonary:     Breath sounds: No decreased breath sounds, wheezing, rhonchi or rales.  Abdominal:     Palpations: Abdomen is soft.     Tenderness: There is no abdominal tenderness.  Musculoskeletal:     Right lower leg: Swelling present.     Left lower leg: Swelling present.  Skin:    General: Skin is warm.     Findings: No rash.  Neurological:     Mental Status: He is alert and oriented to person, place, and time.     Data Reviewed: Basic Metabolic Panel: Recent  Labs  Lab 10/16/20 1536 10/17/20 0612 10/19/20 0112 10/21/20 0545 10/22/20 0527  NA 137 137 135 136 138  K 4.7 4.1 5.1 4.9 4.4  CL 102 104 104 106 105  CO2 GLUCOSE 153* 174* 208* 228* 131*  BUN 38* 36* 54* 46* 35*  CREATININE 1.61* 1.23 1.19 0.96 0.85  CALCIUM 8.8* 8.2* 8.7* 8.6* 8.4*  MG 1.9  --   --   --   --    Liver Function Tests: Recent Labs  Lab 10/16/20 1536 10/17/20 0612  AST 14* 9*  ALT 6 10  ALKPHOS 72 57  BILITOT 1.2 0.8  PROT 7.1 6.3*  ALBUMIN 3.2* 2.7*   CBC: Recent Labs  Lab 10/16/20 1536 10/19/20 0112 10/21/20 0545  WBC 18.0* 13.0* 9.9  NEUTROABS 16.3*  --   --   HGB 11.4* 10.0* 11.4*  HCT 35.0* 30.9* 35.2*  MCV 86.8 88.3 85.9  PLT 196 217 311    BNP (last 3 results) Recent Labs    12/27/19 1605  BNP 102.6*     CBG: Recent Labs  Lab 10/21/20 1627 10/21/20 2118 10/22/20 0813 10/22/20 1202 10/22/20 1730  GLUCAP 155* 107* 155* 275* 290*    Recent Results (from the past 240 hour(s))  Resp Panel by  RT-PCR (Flu A&B, Covid) Nasopharyngeal Swab     Status: None   Collection Time: 10/16/20  3:36 PM   Specimen: Nasopharyngeal Swab; Nasopharyngeal(NP) swabs in vial transport medium  Result Value Ref Range Status   SARS Coronavirus 2 by RT PCR NEGATIVE NEGATIVE Final    Comment: (NOTE) SARS-CoV-2 target nucleic acids are NOT DETECTED.  The SARS-CoV-2 RNA is generally detectable in upper respiratory specimens during the acute phase of infection. The lowest concentration of SARS-CoV-2 viral copies this assay can detect is 138 copies/mL. A negative result does not preclude SARS-Cov-2 infection and should not be used as the sole basis for treatment or other patient management decisions. A negative result may occur with  improper specimen collection/handling, submission of specimen other than nasopharyngeal swab, presence of viral mutation(s) within the areas targeted by this assay, and inadequate number of  viral copies(<138 copies/mL). A negative result must be combined with clinical observations, patient history, and epidemiological information. The expected result is Negative.  Fact Sheet for Patients:  BloggerCourse.com  Fact Sheet for Healthcare Providers:  SeriousBroker.it  This test is no t yet approved or cleared by the [place] FDA and  has been authorized for detection and/or diagnosis of SARS-CoV-2 by FDA under an Emergency Use Authorization (EUA). This EUA will remain  in effect (meaning this test can be used) for the duration of the COVID-19 declaration under Section 564(b)(1) of the Act, 21 U.S.C.section 360bbb-3(b)(1), unless the authorization is terminated  or revoked sooner.       Influenza A by PCR NEGATIVE NEGATIVE Final   Influenza B by PCR NEGATIVE NEGATIVE Final    Comment: (NOTE) The Xpert Xpress SARS-CoV-2/FLU/RSV plus assay is intended as an aid in the diagnosis of influenza from Nasopharyngeal swab specimens and should not be used as a sole basis for treatment. Nasal washings and aspirates are unacceptable for Xpert Xpress SARS-CoV-2/FLU/RSV testing.  Fact Sheet for Patients: BloggerCourse.com  Fact Sheet for Healthcare Providers: SeriousBroker.it  This test is not yet approved or cleared by the [place] FDA and has been authorized for detection and/or diagnosis of SARS-CoV-2 by FDA under an Emergency Use Authorization (EUA). This EUA will remain in effect (meaning this test can be used) for the duration of the COVID-19 declaration under Section 564(b)(1) of the Act, 21 U.S.C. section 360bbb-3(b)(1), unless the authorization is terminated or revoked.  Performed at [facility], [address] [place], [place] [address]   Blood culture (routine x 2)     Status: Abnormal   Collection Time: 10/16/20  7:26 PM   Specimen: BLOOD LEFT  FOREARM  Result Value Ref Range Status   Specimen Description   Final    BLOOD LEFT FOREARM Performed at [facility], [address], [place], [place] [address]    Special Requests   Final    BOTTLES DRAWN AEROBIC AND ANAEROBIC Blood Culture adequate volume Performed at [facility], [address], [place], [place] [address]    Culture  Setup Time   Final    GRAM POSITIVE COCCI IN BOTH AEROBIC AND ANAEROBIC BOTTLES CRITICAL RESULT CALLED TO, READ BACK BY AND VERIFIED WITH: KRISTEN MERRIL, PHARMD AT 0830 ON 10/17/20 BY GM Performed at [facility], [address] [place], [place] [address]    Culture STREPTOCOCCUS GROUP C (A)  Final   Report Status 10/19/2020 FINAL  Final   Organism ID, Bacteria STREPTOCOCCUS GROUP C  Final      Susceptibility  Streptococcus group c - MIC*    CLINDAMYCIN <=0.25 SENSITIVE Sensitive     AMPICILLIN <=0.25 SENSITIVE Sensitive     ERYTHROMYCIN <=0.12 SENSITIVE Sensitive     VANCOMYCIN 0.5 SENSITIVE Sensitive     CEFTRIAXONE <=0.12      LEVOFLOXACIN 1 SENSITIVE Sensitive     PENICILLIN Value in next row Sensitive      SENSITIVE0.06    * STREPTOCOCCUS GROUP C  Blood Culture ID Panel (Reflexed)     Status: Abnormal   Collection Time: 10/16/20  7:26 PM  Result Value Ref Range Status   Enterococcus faecalis NOT DETECTED NOT DETECTED Final   Enterococcus Faecium NOT DETECTED NOT DETECTED Final   Listeria monocytogenes NOT DETECTED NOT DETECTED Final   Staphylococcus species NOT DETECTED NOT DETECTED Final   Staphylococcus aureus (BCID) NOT DETECTED NOT DETECTED Final   Staphylococcus epidermidis NOT DETECTED NOT DETECTED Final   Staphylococcus lugdunensis NOT DETECTED NOT DETECTED Final   Streptococcus species DETECTED (A) NOT DETECTED Final    Comment: Not Enterococcus species, Streptococcus agalactiae, Streptococcus pyogenes, or Streptococcus pneumoniae. CRITICAL RESULT CALLED TO, READ BACK BY AND VERIFIED WITH: KRISTEN  MERRIL, PHARMD AT 0830 ON 10/17/20 BY GM    Streptococcus agalactiae NOT DETECTED NOT DETECTED Final   Streptococcus pneumoniae NOT DETECTED NOT DETECTED Final   Streptococcus pyogenes NOT DETECTED NOT DETECTED Final   A.calcoaceticus-baumannii NOT DETECTED NOT DETECTED Final   Bacteroides fragilis NOT DETECTED NOT DETECTED Final   Enterobacterales NOT DETECTED NOT DETECTED Final   Enterobacter cloacae complex NOT DETECTED NOT DETECTED Final   Escherichia coli NOT DETECTED NOT DETECTED Final   Klebsiella aerogenes NOT DETECTED NOT DETECTED Final   Klebsiella oxytoca NOT DETECTED NOT DETECTED Final   Klebsiella pneumoniae NOT DETECTED NOT DETECTED Final   Proteus species NOT DETECTED NOT DETECTED Final   Salmonella species NOT DETECTED NOT DETECTED Final   Serratia marcescens NOT DETECTED NOT DETECTED Final   Haemophilus influenzae NOT DETECTED NOT DETECTED Final   Neisseria meningitidis NOT DETECTED NOT DETECTED Final   Pseudomonas aeruginosa NOT DETECTED NOT DETECTED Final   Stenotrophomonas maltophilia NOT DETECTED NOT DETECTED Final   Candida albicans NOT DETECTED NOT DETECTED Final   Candida auris NOT DETECTED NOT DETECTED Final   Candida glabrata NOT DETECTED NOT DETECTED Final   Candida krusei NOT DETECTED NOT DETECTED Final   Candida parapsilosis NOT DETECTED NOT DETECTED Final   Candida tropicalis NOT DETECTED NOT DETECTED Final   Cryptococcus neoformans/gattii NOT DETECTED NOT DETECTED Final    Comment: Performed at [facility], [address] [place], [place] [address]  Blood culture (routine x 2)     Status: None   Collection Time: 10/16/20 11:23 PM   Specimen: BLOOD RIGHT FOREARM  Result Value Ref Range Status   Specimen Description BLOOD RIGHT FOREARM  Final   Special Requests   Final    BOTTLES DRAWN AEROBIC AND ANAEROBIC Blood Culture adequate volume   Culture   Final    NO GROWTH 5 DAYS Performed at [facility], [address]  [place], [place] [address]    Report Status 10/21/2020 FINAL  Final  CULTURE, BLOOD (ROUTINE X 2) w Reflex to ID Panel     Status: None (Preliminary result)   Collection Time: 10/19/20  1:11 AM   Specimen: BLOOD RIGHT FOREARM  Result Value Ref Range Status   Specimen Description BLOOD RIGHT FOREARM  Final   Special Requests   Final  BOTTLES DRAWN AEROBIC AND ANAEROBIC Blood Culture results may not be optimal due to an excessive volume of blood received in culture bottles   Culture   Final    NO GROWTH 2 DAYS Performed at [facility], [address] [place], [place] [address]    Report Status PENDING  Incomplete  CULTURE, BLOOD (ROUTINE X 2) w Reflex to ID Panel     Status: None (Preliminary result)   Collection Time: 10/19/20  1:17 AM   Specimen: BLOOD RIGHT HAND  Result Value Ref Range Status   Specimen Description BLOOD RIGHT HAND  Final   Special Requests   Final    BOTTLES DRAWN AEROBIC AND ANAEROBIC Blood Culture adequate volume   Culture   Final    NO GROWTH 2 DAYS Performed at [facility], [address], [place], [place] [address]    Report Status PENDING  Incomplete     Studies: DG Abd 1 View  Result Date: 10/21/2020 CLINICAL DATA:  Intractable vomiting with nausea. EXAM: ABDOMEN - 1 VIEW COMPARISON:  None. FINDINGS: Supine KUB shows fairly marked gaseous distention of the stomach. There is diffuse gaseous distension of small bowel with air and stool scattered along the length of the colon. Bones are diffusely demineralized with degenerative changes in the lumbar spine. Patient is status post bilateral hip replacement. IMPRESSION: Marked gaseous distention of the stomach with diffuse gaseous distention of small bowel. Imaging features are compatible with ileus. While bowel gas pattern does not appear frankly obstructive, follow-up imaging may be warranted. Electronically Signed   By: Kennith Center M.D.   On: 10/21/2020 05:47   DG Abd 2 Views  Result  Date: 10/22/2020 CLINICAL DATA:  Ileus, abdominal pain and distention, inpatient EXAM: ABDOMEN - 2 VIEW COMPARISON:  10/21/2020 abdominal radiograph FINDINGS: A few mildly dilated small bowel loops in the central abdomen, mildly improved. Moderate diffuse colonic stool and gas. No evidence of pneumatosis or pneumoperitoneum. No radiopaque nephrolithiasis. Marked lumbar spondylosis. Clear lung bases. Partially visualized bilateral total hip arthroplasty. IMPRESSION: A few mildly dilated small bowel loops in the central abdomen, mildly improved. Moderate diffuse colonic stool and gas, similar. Findings are compatible with improving mild adynamic ileus as reported. Electronically Signed   By: Delbert Phenix M.D.   On: 10/22/2020 09:29    Scheduled Meds:  apixaban  5 mg Oral BID   ascorbic acid  500 mg Oral Daily   benzonatate  100 mg Oral TID   budesonide (PULMICORT) nebulizer solution  0.5 mg Nebulization BID   carbidopa-levodopa  1 tablet Oral TID   Chlorhexidine Gluconate Cloth  6 each Topical Daily   escitalopram  5 mg Oral Daily   feeding supplement  237 mL Oral BID BM   finasteride  5 mg Oral Daily   fluticasone  1 spray Each Nare Daily   furosemide  20 mg Oral Daily   gabapentin  800 mg Oral BID   guaiFENesin  600 mg Oral Q12H   insulin aspart  0-5 Units Subcutaneous QHS   insulin aspart  0-9 Units Subcutaneous TID WC   ipratropium  1 spray Each Nare BID   ipratropium-albuterol  3 mL Nebulization TID   lactase  3,000 Units Oral TID WC   loratadine  10 mg Oral Daily   methylPREDNISolone (SOLU-MEDROL) injection  40 mg Intravenous Daily   metoprolol succinate  50 mg Oral Daily   montelukast  10 mg Oral QHS   polyethylene glycol  17 g  Oral BID   senna-docusate  2 tablet Oral QHS   tamsulosin  0.4 mg Oral Daily   traZODone  100 mg Oral QHS   Continuous Infusions:  cefTRIAXone (ROCEPHIN)  IV Stopped (10/21/20 2233)    Assessment/Plan:  Ileus.  Advance diet to full liquid diet with  patient not having any further vomiting.  He declined NG tube yesterday.  Patient has not had a bowel movement yet.  Start MiraLAX twice daily and senna at night. Clinical sepsis, present on admission.  Group C streptococcus growing out of blood cultures.  Will need PICC line prior to discharge and 4 weeks of IV antibiotics.  Currently on Rocephin. COPD exacerbation.  Change Solu-Medrol over to prednisone for tomorrow.  Patient does have some upper airway congestion that seems better with nebulizer treatments. Leakage of urine with suprapubic catheter and penis with coughing or vomiting.  Nursing staff unable to find somebody that can change out the suprapubic catheter.  Messaged urology Dr. Lonna Cobb to change out suprapubic catheter. Right knee pain, right elbow pain and neck pain.  No signs of septic joints. Paroxysmal atrial fibrillation on Eliquis.  I coagulation metoprolol for rate control. Acute kidney injury on presentation with creatinine of 1.66 on presentation and improved down to 0.85. 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  Start tapering steroids.       Code Status:     Code Status Orders  (From admission, onward)           Start     Ordered   10/16/20 1956  Full code  Continuous        10/16/20 2006           Code Status History     This patient has a current code status but no historical code status.      Family Communication: Spoke with sister on the phone Disposition Plan: Status is: Inpatient  Dispo: The patient is from: Rehab              Anticipated d/c is to: Rehab              Patient currently still with ileus.   Difficult to place patient.  No.  Consultants: Infectious disease Urology  Antibiotics: -Rocephin  Time spent: 26 minutes  [employer]

## 2020-10-23 ENCOUNTER — Inpatient Hospital Stay: Payer: No Typology Code available for payment source

## 2020-10-23 DIAGNOSIS — T83038A Leakage of other indwelling urethral catheter, initial encounter: Secondary | ICD-10-CM

## 2020-10-23 DIAGNOSIS — R7881 Bacteremia: Secondary | ICD-10-CM | POA: Diagnosis not present

## 2020-10-23 DIAGNOSIS — K567 Ileus, unspecified: Secondary | ICD-10-CM | POA: Diagnosis not present

## 2020-10-23 DIAGNOSIS — M542 Cervicalgia: Secondary | ICD-10-CM | POA: Diagnosis not present

## 2020-10-23 DIAGNOSIS — B955 Unspecified streptococcus as the cause of diseases classified elsewhere: Secondary | ICD-10-CM | POA: Diagnosis not present

## 2020-10-23 DIAGNOSIS — N4889 Other specified disorders of penis: Secondary | ICD-10-CM

## 2020-10-23 DIAGNOSIS — A408 Other streptococcal sepsis: Secondary | ICD-10-CM | POA: Diagnosis not present

## 2020-10-23 DIAGNOSIS — M25561 Pain in right knee: Secondary | ICD-10-CM | POA: Diagnosis not present

## 2020-10-23 LAB — BASIC METABOLIC PANEL
Anion gap: 6 (ref 5–15)
BUN: 26 mg/dL — ABNORMAL HIGH (ref 8–23)
CO2: 28 mmol/L (ref 22–32)
Calcium: 8.5 mg/dL — ABNORMAL LOW (ref 8.9–10.3)
Chloride: 103 mmol/L (ref 98–111)
Creatinine, Ser: 0.75 mg/dL (ref 0.61–1.24)
GFR, Estimated: >60 mL/min (ref >60)
Glucose, Bld: 140 mg/dL — ABNORMAL HIGH (ref 70–99)
Potassium: 4.5 mmol/L (ref 3.5–5.1)
Sodium: 137 mmol/L (ref 135–145)

## 2020-10-23 LAB — GLUCOSE, CAPILLARY
Glucose-Capillary: 114 mg/dL — ABNORMAL HIGH (ref 70–99)
Glucose-Capillary: 233 mg/dL — ABNORMAL HIGH (ref 70–99)
Glucose-Capillary: 318 mg/dL — ABNORMAL HIGH (ref 70–99)
Glucose-Capillary: 232 mg/dL — ABNORMAL HIGH (ref 70–99)

## 2020-10-23 LAB — PROTEIN ELECTROPHORESIS, SERUM
A/G Ratio: 0.8 (ref 0.7–1.7)
Albumin ELP: 2.4 g/dL — ABNORMAL LOW (ref 2.9–4.4)
Alpha-1-Globulin: 0.4 g/dL (ref 0.0–0.4)
Alpha-2-Globulin: 0.8 g/dL (ref 0.4–1.0)
Beta Globulin: 0.8 g/dL (ref 0.7–1.3)
Gamma Globulin: 1.1 g/dL (ref 0.4–1.8)
Globulin, Total: 3.2 g/dL (ref 2.2–3.9)
Total Protein ELP: 5.6 g/dL — ABNORMAL LOW (ref 6.0–8.5)

## 2020-10-23 LAB — HEMOGLOBIN A1C
Hgb A1c MFr Bld: 6.6 % — ABNORMAL HIGH (ref 4.8–5.6)
Mean Plasma Glucose: 143 mg/dL

## 2020-10-23 MED ORDER — IOHEXOL 9 MG/ML PO SOLN
500.0000 mL | ORAL | Status: AC
Start: 2020-10-23 — End: 2020-10-23

## 2020-10-23 NOTE — Progress Notes (Signed)
Date of Admission:  10/16/2020     ID: Raymond Hays is a 75 y.o. male  Active Problems:   AF (paroxysmal atrial fibrillation) (HCC)   Acute pain of right knee   COPD with acute exacerbation (HCC)   Sepsis (HCC)   Neck pain   AKI (acute kidney injury) (HCC)   Type 2 diabetes mellitus with diabetic neuropathy, without long-term current use of insulin (HCC)   Parkinson disease (HCC)   Ileus (HCC)    Subjective: Pt is constipated C/o distended abdomen Has cough Has nausea No pain abdomen Went for CT abdomen this evening   Medications:   apixaban  5 mg Oral BID   ascorbic acid  500 mg Oral Daily   benzonatate  100 mg Oral TID   budesonide (PULMICORT) nebulizer solution  0.5 mg Nebulization BID   carbidopa-levodopa  1 tablet Oral TID   Chlorhexidine Gluconate Cloth  6 each Topical Daily   escitalopram  5 mg Oral Daily   feeding supplement  237 mL Oral BID BM   finasteride  5 mg Oral Daily   fluticasone  1 spray Each Nare Daily   furosemide  20 mg Oral Daily   gabapentin  800 mg Oral BID   guaiFENesin  600 mg Oral Q12H   insulin aspart  0-5 Units Subcutaneous QHS   insulin aspart  0-9 Units Subcutaneous TID WC   ipratropium  1 spray Each Nare BID   ipratropium-albuterol  3 mL Nebulization TID   lactase  3,000 Units Oral TID WC   loratadine  10 mg Oral Daily   metoprolol succinate  50 mg Oral Daily   montelukast  10 mg Oral QHS   polyethylene glycol  17 g Oral BID   senna-docusate  2 tablet Oral QHS   tamsulosin  0.4 mg Oral Daily   traZODone  100 mg Oral QHS    Objective: Vital signs in last 24 hours: Temp:  [97.6 F (36.4 C)-98.9 F (37.2 C)] 98.1 F (36.7 C) (07/25 1625) Pulse Rate:  [72-89] 72 (07/25 1625) Resp:  [14-18] 15 (07/25 1625) BP: (130-156)/(79-100) 130/81 (07/25 1625) SpO2:  [94 %-99 %] 97 % (07/25 1625)  PHYSICAL EXAM:  General: Alert, cooperative, some distress at rest,  Head: Normocephalic, without obvious abnormality, atraumatic. Eyes:  Conjunctivae clear, anicteric sclerae. Pupils are equal ENT Nares normal. No drainage or sinus tenderness. Lips, mucosa, and tongue normal. No Thrush Neck: Supple, symmetrical, no adenopathy, thyroid: non tender no carotid bruit and no JVD. Back: No CVA tenderness. Lungs: b/l air entry. Heart: s1s2p. Abdomen:distended. Supra pubic cath  Extremities: edema legs Skin: No rashes or lesions. Or bruising Lymph: Cervical, supraclavicular normal. Neurologic: Grossly non-focal  Lab Results Recent Labs    10/21/20 0545 10/22/20 0527 10/23/20 0642  WBC 9.9  --   --   HGB 11.4*  --   --   HCT 35.2*  --   --   NA 136 138 137  K 4.9 4.4 4.5  CL 106 105 103  CO2 23 25 28   BUN 46* 35* 26*  CREATININE 0.96 0.85 0.75    Microbiology: 10/16/20 BC streptococcus group C 10/19/20 BC- NG  Studies/Results: DG Abd 2 Views  Result Date: 10/23/2020 CLINICAL DATA:  Ileus. EXAM: ABDOMEN - 2 VIEW COMPARISON:  October 22, 2020. FINDINGS: No colonic dilatation is noted. Moderate amount of stool seen throughout the colon. Stable mildly dilated small bowel loops are noted which most consistent with ileus. There is  no evidence of free air. No radio-opaque calculi or other significant radiographic abnormality is seen. IMPRESSION: Stable mildly dilated small bowel loops are noted most consistent with ileus. Moderate stool burden. Electronically Signed   By: Lupita Raider M.D.   On: 10/23/2020 08:06   DG Abd 2 Views  Result Date: 10/22/2020 CLINICAL DATA:  Ileus, abdominal pain and distention, inpatient EXAM: ABDOMEN - 2 VIEW COMPARISON:  10/21/2020 abdominal radiograph FINDINGS: A few mildly dilated small bowel loops in the central abdomen, mildly improved. Moderate diffuse colonic stool and gas. No evidence of pneumatosis or pneumoperitoneum. No radiopaque nephrolithiasis. Marked lumbar spondylosis. Clear lung bases. Partially visualized bilateral total hip arthroplasty. IMPRESSION: A few mildly dilated small  bowel loops in the central abdomen, mildly improved. Moderate diffuse colonic stool and gas, similar. Findings are compatible with improving mild adynamic ileus as reported. Electronically Signed   By: Delbert Phenix M.D.   On: 10/22/2020 09:29     Assessment/Plan:   Streptococcus group c bacteremia- TEE neg Cervical spine hardware and has more than baseline neck pain- CT did not show any lossening or infection Will give 4 weeks of ceftriaxone until 11/13/20 because of increased risk for deep infection because of hardware and also mitral clips  Constipation with small bowel ileus with severe distension CT scan done and result pending  MR /CHF  Paroxysmal Afib on eliquis  BPH- ahs suprapubic cath  AKI on CKD- reoslved  Dm  COPD -off steroids   Discussed the management with patient and his nurse

## 2020-10-23 NOTE — Progress Notes (Addendum)
Physical Therapy Treatment Patient Details Name: Raymond Hays MRN: [id number] DOB: March 05, 1946 Today's Date: 10/23/2020    History of Present Illness Raymond Hays is a 74yoM who comes to [facility] on 7/18 from [facility] with 4 days weakness, DOE. PMH: DM2, HTN, COPD, GAD, PND, PAF. Pt admitted c ARI, sepsis, AKI, dehydration. At baseline pt is able to perform supervised transfers bed to/from [facility], able to perform intermittent limited self propulsion in WC. Pt reportedly could AMB up to 92ft with therapy team using a RW.    PT Comments    Pt ready for session.  To EOB with mod a x 2 and puts in good effort to assist.  Steady in static sitting.  He is able to stand x 3 with extended rest periods each attempt.  Elevated surface and mod/max a x 2 to stand.  On one attempt he is abe to take 2 small shuffle steps towards [facility] but it is very labor intensive for him.  C/o significant neck and right knee pain which does affect ability.    Pt would be appropriate for hoyer or sit to stand lift for transfers if OOB with nursing is needed.  He is unable to safely step to allow for transfer to chair at this time.  Pt does voice frustration in general weakness and difficulty with mobility along with pain.   Follow Up Recommendations  Supervision for mobility/OOB;Home health PT;Supervision - Intermittent     Equipment Recommendations  None recommended by PT    Recommendations for Other Services       Precautions / Restrictions Precautions Precautions: Fall Restrictions Weight Bearing Restrictions: No    Mobility  Bed Mobility Overal bed mobility: Needs Assistance Bed Mobility: Supine to Sit;Sit to Supine Rolling: +2 for physical assistance;Mod assist   Supine to sit: Max assist;+2 for physical assistance          Transfers Overall transfer level: Needs assistance Equipment used: Rolling walker (2 wheeled) Transfers: Sit to/from Stand Sit to Stand: Mod assist;Max assist;+2 physical  assistance;From elevated surface         General transfer comment: stood x 3 with significant assist  Ambulation/Gait Ambulation/Gait assistance: Mod assist;Max assist;+2 physical assistance Gait Distance (Feet): 2 Feet Assistive device: Rolling walker (2 wheeled) Gait Pattern/deviations: Step-to pattern Gait velocity: decreased   General Gait Details: able to shuffle feet 2 steps to right with difficulty   Stairs             Wheelchair Mobility    Modified Rankin (Stroke Patients Only)       Balance Overall balance assessment: Needs assistance Sitting-balance support: No upper extremity supported;Feet supported Sitting balance-Leahy Scale: Fair     Standing balance support: Bilateral upper extremity supported Standing balance-Leahy Scale: Poor Standing balance comment: significant assist to remain upright but does try the best he can                            Cognition Arousal/Alertness: Awake/alert Behavior During Therapy: WFL for tasks assessed/performed Overall Cognitive Status: Within Functional Limits for tasks assessed                                        Exercises      General Comments        Pertinent Vitals/Pain Pain Assessment: 0-10 Pain Score: 10-Worst pain ever  Pain Location: R knee, neck Pain Descriptors / Indicators: Aching;Discomfort;Grimacing Pain Intervention(s): Limited activity within patient's tolerance;Monitored during session;Repositioned    Home Living                      Prior Function            PT Goals (current goals can now be found in the care plan section) Progress towards PT goals: Progressing toward goals    Frequency    Min 2X/week      PT Plan Current plan remains appropriate    Co-evaluation              AM-PAC PT "6 Clicks" Mobility   Outcome Measure  Help needed turning from your back to your side while in a flat bed without using bedrails?: A  Lot Help needed moving from lying on your back to sitting on the side of a flat bed without using bedrails?: A Lot Help needed moving to and from a bed to a chair (including a wheelchair)?: Total Help needed standing up from a chair using your arms (e.g., wheelchair or bedside chair)?: A Lot Help needed to walk in hospital room?: Total Help needed climbing 3-5 steps with a railing? : Total 6 Click Score: 9    End of Session   Activity Tolerance: Patient limited by fatigue Patient left: in bed;with call bell/phone within reach;with bed alarm set Nurse Communication: Mobility status PT Visit Diagnosis: Difficulty in walking, not elsewhere classified (R26.2);Other abnormalities of gait and mobility (R26.89);Muscle weakness (generalized) (M62.81);History of falling (Z91.81)     Time: 7062-3762 PT Time Calculation (min) (ACUTE ONLY): 24 min  Charges:  $Therapeutic Activity: 23-37 mins                    Danielle Dess, PTA 10/23/20, 11:37 AM , 11:34 AM

## 2020-10-23 NOTE — Consult Note (Signed)
Urology Consult  I have been asked to see the patient by Dr. Renae Gloss, for evaluation and management of leaking SPT.  Chief Complaint: Urine leaking from penis, around SPT  History of Present Illness: Raymond Hays is a 75 y.o. 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  Urology has been consulted to assess his suprapubic catheter, as patient reports urinary leakage from his penis and from around his SP tube.  Today, patient reports new onset of urinary leakage since his hospitalization.  He states this is not typical for him.  He is a patient of the [place] in [place], where he typically undergoes monthly SP tube exchanges.  He is unsure when his SP tube was originally placed, but thinks it may have been in May of this year.  He thinks his most recent SP tube change was about 2 weeks ago, but he is unsure of this.  He states his catheter has never been occluded, but also asks if his SP tube needs to be irrigated.  Past Medical History:  Diagnosis Date   Allergic rhinitis    Anxiety    Arthritis    Chronic indwelling Foley catheter    COPD (chronic obstructive pulmonary disease) (HCC)    Cough    Diabetes (HCC)    Essential (primary) hypertension    Hemorrhoid    Hyperlipidemia    PAF (paroxysmal atrial fibrillation) (HCC)    Primary osteoarthritis, unspecified shoulder    Retention of urine, unspecified    Spinal stenosis    UTI (urinary tract infection)     Past Surgical History:  Procedure Laterality Date   APPENDECTOMY     BACK SURGERY     ELBOW SURGERY     HERNIA REPAIR     KNEE SURGERY     neck     PARTIAL HIP ARTHROPLASTY Bilateral    TEE WITHOUT CARDIOVERSION N/A 10/19/2020   Procedure: TRANSESOPHAGEAL ECHOCARDIOGRAM (TEE);  Surgeon: Antonieta Iba, MD;  Location: [facility] ORS;  Service: Cardiovascular;  Laterality: N/A;   TONSILLECTOMY       Home Medications:  Current Meds  Medication Sig   acetaminophen (TYLENOL) 325 MG tablet Take 650 mg by mouth every 4 (four) hours as needed.   ANTI-DANDRUFF 1 % SHAM Apply topically.   apixaban (ELIQUIS) 5 MG TABS tablet Take 5 mg by mouth 2 (two) times daily.    atorvastatin (LIPITOR) 20 MG tablet Take 20 mg by mouth daily.   cetirizine (ZYRTEC) 10 MG tablet Take 10 mg by mouth daily.    diclofenac Sodium (VOLTAREN) 1 % GEL Apply 4 g topically 4 (four) times daily.   escitalopram (LEXAPRO) 5 MG tablet Take 5 mg by mouth daily.   finasteride (PROSCAR) 5 MG tablet Take 5 mg by mouth daily.   gabapentin (NEURONTIN) 800 MG tablet Take 800 mg by mouth 2 (two) times daily.    guaiFENesin (MUCINEX) 600 MG 12 hr tablet Take 600 mg by mouth every 12 (twelve) hours.   ipratropium (ATROVENT) 0.03 % nasal spray Place 2 sprays into both nostrils 2 (two) times daily.   lactase (LACTAID) 3000 units tablet Take 3,000 Units by mouth 3 (three) times daily with meals.   methocarbamol (ROBAXIN) 500 MG tablet Take 1,000 mg by mouth in the morning and at bedtime.    metoprolol succinate (TOPROL-XL) 50 MG 24 hr tablet Take 50 mg by mouth daily.  montelukast (SINGULAIR) 10 MG tablet Take 10 mg by mouth at bedtime.   nitrofurantoin, macrocrystal-monohydrate, (MACROBID) 100 MG capsule Take 100 mg by mouth at bedtime.   polyethylene glycol (MIRALAX / GLYCOLAX) 17 g packet Take 17 g by mouth daily.   predniSONE (DELTASONE) 20 MG tablet Take 20 mg by mouth daily with breakfast.   simethicone (MYLICON) 125 MG chewable tablet Chew 125 mg by mouth 3 (three) times daily.   Skin Protectants, Misc. (MINERIN CREME EX) Apply topically. Apply to feet twice bid   tamsulosin (FLOMAX) 0.4 MG CAPS capsule Take 0.4 mg by mouth 2 (two) times daily.   torsemide (DEMADEX) 10 MG tablet Take 10 mg by mouth daily.   traMADol (ULTRAM) 50 MG tablet Take 100 mg by mouth in the morning.    traZODone (DESYREL) 100 MG tablet Take 100  mg by mouth at bedtime.   TRELEGY ELLIPTA 100-62.5-25 MCG/INH AEPB Inhale 1 puff into the lungs daily.   triamcinolone cream (KENALOG) 0.1 % Apply 1 application topically 2 (two) times daily.   [DISCONTINUED] carbidopa-levodopa (SINEMET IR) 25-100 MG tablet Take 1 tablet by mouth 3 (three) times daily. Start Carbidopa Levodopa as follows: Take 1/2 tablet three times daily, at least 30 minutes before meals (approximately 7am/11am/4pm), for one week Then take 1/2 tablet in the morning, 1/2 tablet in the afternoon, 1 tablet in the evening, at least 30 minutes before meals, for one week Then take 1/2 tablet in the morning, 1 tablet in the afternoon, 1 tablet in the evening, at least 30 minutes before meals, for one week Then take 1 tablet three times daily at 7am/11am/4pm, at least 30 minutes before meals (Patient taking differently: Take 1 tablet by mouth 3 (three) times daily before meals.)    Allergies: No Known Allergies  Family History  Problem Relation Age of Onset   Healthy Son    Healthy Son     Social History:  reports that he quit smoking about 16 years ago. His smoking use included cigarettes. He has a 5.00 pack-year smoking history. He has never used smokeless tobacco. He reports previous alcohol use. He reports previous drug use. Drug: "Crack" cocaine.  ROS: A complete review of systems was performed.  All systems are negative except for pertinent findings as noted.  Physical Exam:  Vital signs in last 24 hours: Temp:  [97.6 F (36.4 C)-98.9 F (37.2 C)] 98.3 F (36.8 C) (07/25 0805) Pulse Rate:  [73-89] 79 (07/25 0805) Resp:  [14-19] 14 (07/25 0805) BP: (102-156)/(79-100) 137/79 (07/25 0805) SpO2:  [94 %-99 %] 94 % (07/25 0805) Constitutional:  Alert and oriented, no acute distress HEENT:  AT, moist mucus membranes Cardiovascular: No clubbing, cyanosis, or edema Respiratory: Normal respiratory effort GU: 18Fr latex suprapubic catheter in place draining clear, yellow  urine.  There is a damp towel overlying his SP tube site where urine has leaked from around the tube.  Tract is not erythematous and no purulent drainage noted. Skin: No rashes, bruises or suspicious lesions Lymph: No cervical or inguinal adenopathy Neurologic: Grossly intact, no focal deficits, moving all 4 extremities Psychiatric: Normal mood and affect  Laboratory Data:  Recent Labs    10/21/20 0545  WBC 9.9  HGB 11.4*  HCT 35.2*   Recent Labs    10/21/20 0545 10/22/20 0527 10/23/20 0642  NA 136 138 137  K 4.9 4.4 4.5  CL 106 105 103  CO2 GLUCOSE 228* 131* 140*  BUN 46* 35*  26*  CREATININE 0.96 0.85 0.75  CALCIUM 8.6* 8.4* 8.5*   Urinalysis    Component Value Date/Time   COLORURINE YELLOW (A) 10/16/2020 1536   APPEARANCEUR HAZY (A) 10/16/2020 1536   LABSPEC 1.018 10/16/2020 1536   PHURINE 5.0 10/16/2020 1536   GLUCOSEU NEGATIVE 10/16/2020 1536   HGBUR MODERATE (A) 10/16/2020 1536   BILIRUBINUR NEGATIVE 10/16/2020 1536   KETONESUR NEGATIVE 10/16/2020 1536   PROTEINUR NEGATIVE 10/16/2020 1536   NITRITE NEGATIVE 10/16/2020 1536   LEUKOCYTESUR NEGATIVE 10/16/2020 1536   Results for orders placed or performed during the hospital encounter of 10/16/20  Resp Panel by RT-PCR (Flu A&B, Covid) Nasopharyngeal Swab     Status: None   Collection Time: 10/16/20  3:36 PM   Specimen: Nasopharyngeal Swab; Nasopharyngeal(NP) swabs in vial transport medium  Result Value Ref Range Status   SARS Coronavirus 2 by RT PCR NEGATIVE NEGATIVE Final    Comment: (NOTE) SARS-CoV-2 target nucleic acids are NOT DETECTED.  The SARS-CoV-2 RNA is generally detectable in upper respiratory specimens during the acute phase of infection. The lowest concentration of SARS-CoV-2 viral copies this assay can detect is 138 copies/mL. A negative result does not preclude SARS-Cov-2 infection and should not be used as the sole basis for treatment or other patient management decisions. A  negative result may occur with  improper specimen collection/handling, submission of specimen other than nasopharyngeal swab, presence of viral mutation(s) within the areas targeted by this assay, and inadequate number of viral copies(<138 copies/mL). A negative result must be combined with clinical observations, patient history, and epidemiological information. The expected result is Negative.  Fact Sheet for Patients:  BloggerCourse.com  Fact Sheet for Healthcare Providers:  SeriousBroker.it  This test is no t yet approved or cleared by the [place] FDA and  has been authorized for detection and/or diagnosis of SARS-CoV-2 by FDA under an Emergency Use Authorization (EUA). This EUA will remain  in effect (meaning this test can be used) for the duration of the COVID-19 declaration under Section 564(b)(1) of the Act, 21 U.S.C.section 360bbb-3(b)(1), unless the authorization is terminated  or revoked sooner.       Influenza A by PCR NEGATIVE NEGATIVE Final   Influenza B by PCR NEGATIVE NEGATIVE Final    Comment: (NOTE) The Xpert Xpress SARS-CoV-2/FLU/RSV plus assay is intended as an aid in the diagnosis of influenza from Nasopharyngeal swab specimens and should not be used as a sole basis for treatment. Nasal washings and aspirates are unacceptable for Xpert Xpress SARS-CoV-2/FLU/RSV testing.  Fact Sheet for Patients: BloggerCourse.com  Fact Sheet for Healthcare Providers: SeriousBroker.it  This test is not yet approved or cleared by the [place] FDA and has been authorized for detection and/or diagnosis of SARS-CoV-2 by FDA under an Emergency Use Authorization (EUA). This EUA will remain in effect (meaning this test can be used) for the duration of the COVID-19 declaration under Section 564(b)(1) of the Act, 21 U.S.C. section 360bbb-3(b)(1), unless the authorization  is terminated or revoked.  Performed at [facility], [address] [place], [place] [address]   Blood culture (routine x 2)     Status: Abnormal   Collection Time: 10/16/20  7:26 PM   Specimen: BLOOD LEFT FOREARM  Result Value Ref Range Status   Specimen Description   Final    BLOOD LEFT FOREARM Performed at [facility], [address] [place], [place] [address]    Special Requests   Final    BOTTLES DRAWN AEROBIC  AND ANAEROBIC Blood Culture adequate volume Performed at [facility], [address] [place], [place] [address]    Culture  Setup Time   Final    GRAM POSITIVE COCCI IN BOTH AEROBIC AND ANAEROBIC BOTTLES CRITICAL RESULT CALLED TO, READ BACK BY AND VERIFIED WITH: KRISTEN MERRIL, PHARMD AT 0830 ON 10/17/20 BY GM Performed at [facility], [address], [place], [place] [address]    Culture STREPTOCOCCUS GROUP C (A)  Final   Report Status 10/19/2020 FINAL  Final   Organism ID, Bacteria STREPTOCOCCUS GROUP C  Final      Susceptibility   Streptococcus group c - MIC*    CLINDAMYCIN <=0.25 SENSITIVE Sensitive     AMPICILLIN <=0.25 SENSITIVE Sensitive     ERYTHROMYCIN <=0.12 SENSITIVE Sensitive     VANCOMYCIN 0.5 SENSITIVE Sensitive     CEFTRIAXONE <=0.12      LEVOFLOXACIN 1 SENSITIVE Sensitive     PENICILLIN Value in next row Sensitive      SENSITIVE0.06    * STREPTOCOCCUS GROUP C  Blood Culture ID Panel (Reflexed)     Status: Abnormal   Collection Time: 10/16/20  7:26 PM  Result Value Ref Range Status   Enterococcus faecalis NOT DETECTED NOT DETECTED Final   Enterococcus Faecium NOT DETECTED NOT DETECTED Final   Listeria monocytogenes NOT DETECTED NOT DETECTED Final   Staphylococcus species NOT DETECTED NOT DETECTED Final   Staphylococcus aureus (BCID) NOT DETECTED NOT DETECTED Final   Staphylococcus epidermidis NOT DETECTED NOT DETECTED Final   Staphylococcus lugdunensis NOT DETECTED NOT DETECTED Final   Streptococcus  species DETECTED (A) NOT DETECTED Final    Comment: Not Enterococcus species, Streptococcus agalactiae, Streptococcus pyogenes, or Streptococcus pneumoniae. CRITICAL RESULT CALLED TO, READ BACK BY AND VERIFIED WITH: KRISTEN MERRIL, PHARMD AT 0830 ON 10/17/20 BY GM    Streptococcus agalactiae NOT DETECTED NOT DETECTED Final   Streptococcus pneumoniae NOT DETECTED NOT DETECTED Final   Streptococcus pyogenes NOT DETECTED NOT DETECTED Final   A.calcoaceticus-baumannii NOT DETECTED NOT DETECTED Final   Bacteroides fragilis NOT DETECTED NOT DETECTED Final   Enterobacterales NOT DETECTED NOT DETECTED Final   Enterobacter cloacae complex NOT DETECTED NOT DETECTED Final   Escherichia coli NOT DETECTED NOT DETECTED Final   Klebsiella aerogenes NOT DETECTED NOT DETECTED Final   Klebsiella oxytoca NOT DETECTED NOT DETECTED Final   Klebsiella pneumoniae NOT DETECTED NOT DETECTED Final   Proteus species NOT DETECTED NOT DETECTED Final   Salmonella species NOT DETECTED NOT DETECTED Final   Serratia marcescens NOT DETECTED NOT DETECTED Final   Haemophilus influenzae NOT DETECTED NOT DETECTED Final   Neisseria meningitidis NOT DETECTED NOT DETECTED Final   Pseudomonas aeruginosa NOT DETECTED NOT DETECTED Final   Stenotrophomonas maltophilia NOT DETECTED NOT DETECTED Final   Candida albicans NOT DETECTED NOT DETECTED Final   Candida auris NOT DETECTED NOT DETECTED Final   Candida glabrata NOT DETECTED NOT DETECTED Final   Candida krusei NOT DETECTED NOT DETECTED Final   Candida parapsilosis NOT DETECTED NOT DETECTED Final   Candida tropicalis NOT DETECTED NOT DETECTED Final   Cryptococcus neoformans/gattii NOT DETECTED NOT DETECTED Final    Comment: Performed at [facility], [address] [place], [place] [address]  Blood culture (routine x 2)     Status: None   Collection Time: 10/16/20 11:23 PM   Specimen: BLOOD RIGHT FOREARM  Result Value Ref Range Status   Specimen Description BLOOD  RIGHT FOREARM  Final   Special  Requests   Final    BOTTLES DRAWN AEROBIC AND ANAEROBIC Blood Culture adequate volume   Culture   Final    NO GROWTH 5 DAYS Performed at [facility], [address] [place], [place] [address]    Report Status 10/21/2020 FINAL  Final  CULTURE, BLOOD (ROUTINE X 2) w Reflex to ID Panel     Status: None (Preliminary result)   Collection Time: 10/19/20  1:11 AM   Specimen: BLOOD RIGHT FOREARM  Result Value Ref Range Status   Specimen Description BLOOD RIGHT FOREARM  Final   Special Requests   Final    BOTTLES DRAWN AEROBIC AND ANAEROBIC Blood Culture results may not be optimal due to an excessive volume of blood received in culture bottles   Culture   Final    NO GROWTH 2 DAYS Performed at [facility], [address] [place], [place] [address]    Report Status PENDING  Incomplete  CULTURE, BLOOD (ROUTINE X 2) w Reflex to ID Panel     Status: None (Preliminary result)   Collection Time: 10/19/20  1:17 AM   Specimen: BLOOD RIGHT HAND  Result Value Ref Range Status   Specimen Description BLOOD RIGHT HAND  Final   Special Requests   Final    BOTTLES DRAWN AEROBIC AND ANAEROBIC Blood Culture adequate volume   Culture   Final    NO GROWTH 2 DAYS Performed at [facility], [address] [place], [place] [address]    Report Status PENDING  Incomplete   Assessment & Plan:  75 year old comorbid male with chronic suprapubic catheter and new urinary leakage from the penis and around his suprapubic catheter over the past week.  Patient is not a reliable historian, however based on his questioning, I suspect he may have had a problem with catheter occlusion in the past.  Based on physical exam, I elected to forego irrigating his catheter and proceed with SP tube exchange at the bedside today.  Patient was in agreement with this plan.  See procedure note below; notably, there was rapid efflux of urine from the suprapubic tract upon  removal of his old catheter consistent with catheter occlusion.  New SP tube is draining well today, suspect his urinary leakage will resolve at this time.  [facility].  Urology to sign off at this time.  Suprapubic Cath Change  Patient is present today for a suprapubic catheter change due to urinary retention.  9ml of water was drained from the balloon, a 18FR foley cath was removed from the tract without difficulty.  Site was cleaned and prepped in a sterile fashion with betadine.  A 18FR foley cath was replaced into the tract no complications were noted. Urine return was noted, 10 ml of sterile water was inflated into the balloon and a night bag was attached for drainage.  Patient tolerated well.  Performed by: Carman ChingSamantha Geovanna Simko, PA-C   Thank you for involving me in this patient's care, please page with any further questions or concerns.  Carman ChingSamantha Shandie Bertz, PA-C 10/23/2020 9:07 AM

## 2020-10-23 NOTE — Progress Notes (Signed)
Patient ID: Raymond Hays, male   DOB: 03/02/1946, 75 y.o.   MRN: [id number] [facility] PROGRESS NOTE  Raymond Hays XLK:[id number] DOB: 06/11/1945 DOA: 10/16/2020 PCP: [facility]  HPI/Subjective: Patient not have any abdominal pain or vomiting.  Did have some nausea.  Had smears for bowel movements.  Developed ileus here in the hospital.  Admitted for sepsis and found to have Streptococcus C sepsis.  Patient still coughing.  Breathing better.  Suprapubic catheter changed today.  Patient still with with some neck pain.  Objective: Vitals:   10/23/20 1359 10/23/20 1625  BP:  130/81  Pulse:  72  Resp:  15  Temp:  98.1 F (36.7 C)  SpO2: 94% 97%    Intake/Output Summary (Last 24 hours) at 10/23/2020 1708 Last data filed at 10/22/2020 2104 Gross per 24 hour  Intake --  Output 1300 ml  Net -1300 ml   Filed Weights   10/16/20 1534  Weight: 103 kg    ROS: Review of Systems  Respiratory:  Positive for cough. Negative for shortness of breath.   Cardiovascular:  Negative for chest pain.  Gastrointestinal:  Positive for nausea. Negative for abdominal pain and vomiting.  Exam: Physical Exam HENT:     Head: Normocephalic.     Mouth/Throat:     Pharynx: No oropharyngeal exudate.  Eyes:     General: Lids are normal.     Conjunctiva/sclera: Conjunctivae normal.  Cardiovascular:     Rate and Rhythm: Normal rate and regular rhythm.     Heart sounds: Normal heart sounds, S1 normal and S2 normal.  Pulmonary:     Breath sounds: Examination of the right-lower field reveals decreased breath sounds. Examination of the left-lower field reveals decreased breath sounds. Decreased breath sounds present. No wheezing, rhonchi or rales.  Abdominal:     General: Bowel sounds are decreased. There is distension.     Palpations: Abdomen is soft.  Musculoskeletal:     Right lower leg: Swelling present.     Left lower leg: Swelling present.  Skin:    General: Skin is warm.     Findings: No  rash.  Neurological:     Mental Status: He is alert and oriented to person, place, and time.     Data Reviewed: Basic Metabolic Panel: Recent Labs  Lab 10/17/20 0612 10/19/20 0112 10/21/20 0545 10/22/20 0527 10/23/20 0642  NA 137 135 136 138 137  K 4.1 5.1 4.9 4.4 4.5  CL 104 104 106 105 103  CO2 23 24 23 25 28   GLUCOSE 174* 208* 228* 131* 140*  BUN 36* 54* 46* 35* 26*  CREATININE 1.23 1.19 0.96 0.85 0.75  CALCIUM 8.2* 8.7* 8.6* 8.4* 8.5*   Liver Function Tests: Recent Labs  Lab 10/17/20 0612  AST 9*  ALT 10  ALKPHOS 57  BILITOT 0.8  PROT 6.3*  ALBUMIN 2.7*    CBC: Recent Labs  Lab 10/19/20 0112 10/21/20 0545  WBC 13.0* 9.9  HGB 10.0* 11.4*  HCT 30.9* 35.2*  MCV 88.3 85.9  PLT 217 311    BNP (last 3 results) Recent Labs    12/27/19 1605  BNP 102.6*     CBG: Recent Labs  Lab 10/22/20 1730 10/22/20 2101 10/23/20 0743 10/23/20 1104 10/23/20 1656  GLUCAP 290* 229* 114* 233* 318*    Recent Results (from the past 240 hour(s))  Resp Panel by RT-PCR (Flu A&B, Covid) Nasopharyngeal Swab     Status: None   Collection Time: 10/16/20  3:36 PM   Specimen: Nasopharyngeal Swab; Nasopharyngeal(NP) swabs in vial transport medium  Result Value Ref Range Status   SARS Coronavirus 2 by RT PCR NEGATIVE NEGATIVE Final    Comment: (NOTE) SARS-CoV-2 target nucleic acids are NOT DETECTED.  The SARS-CoV-2 RNA is generally detectable in upper respiratory specimens during the acute phase of infection. The lowest concentration of SARS-CoV-2 viral copies this assay can detect is 138 copies/mL. A negative result does not preclude SARS-Cov-2 infection and should not be used as the sole basis for treatment or other patient management decisions. A negative result may occur with  improper specimen collection/handling, submission of specimen other than nasopharyngeal swab, presence of viral mutation(s) within the areas targeted by this assay, and inadequate number of  viral copies(<138 copies/mL). A negative result must be combined with clinical observations, patient history, and epidemiological information. The expected result is Negative.  Fact Sheet for Patients:  BloggerCourse.com  Fact Sheet for Healthcare Providers:  SeriousBroker.it  This test is no t yet approved or cleared by the [place] FDA and  has been authorized for detection and/or diagnosis of SARS-CoV-2 by FDA under an Emergency Use Authorization (EUA). This EUA will remain  in effect (meaning this test can be used) for the duration of the COVID-19 declaration under Section 564(b)(1) of the Act, 21 U.S.C.section 360bbb-3(b)(1), unless the authorization is terminated  or revoked sooner.       Influenza A by PCR NEGATIVE NEGATIVE Final   Influenza B by PCR NEGATIVE NEGATIVE Final    Comment: (NOTE) The Xpert Xpress SARS-CoV-2/FLU/RSV plus assay is intended as an aid in the diagnosis of influenza from Nasopharyngeal swab specimens and should not be used as a sole basis for treatment. Nasal washings and aspirates are unacceptable for Xpert Xpress SARS-CoV-2/FLU/RSV testing.  Fact Sheet for Patients: BloggerCourse.com  Fact Sheet for Healthcare Providers: SeriousBroker.it  This test is not yet approved or cleared by the [place] FDA and has been authorized for detection and/or diagnosis of SARS-CoV-2 by FDA under an Emergency Use Authorization (EUA). This EUA will remain in effect (meaning this test can be used) for the duration of the COVID-19 declaration under Section 564(b)(1) of the Act, 21 U.S.C. section 360bbb-3(b)(1), unless the authorization is terminated or revoked.  Performed at [facility], [address] [place], [place] [address]   Blood culture (routine x 2)     Status: Abnormal   Collection Time: 10/16/20  7:26 PM   Specimen: BLOOD LEFT  FOREARM  Result Value Ref Range Status   Specimen Description   Final    BLOOD LEFT FOREARM Performed at [facility], [address] [place], [place] [address]    Special Requests   Final    BOTTLES DRAWN AEROBIC AND ANAEROBIC Blood Culture adequate volume Performed at [facility], [address] [place], [place] [address]    Culture  Setup Time   Final    GRAM POSITIVE COCCI IN BOTH AEROBIC AND ANAEROBIC BOTTLES CRITICAL RESULT CALLED TO, READ BACK BY AND VERIFIED WITH: KRISTEN MERRIL, PHARMD AT 0830 ON 10/17/20 BY GM Performed at [facility], [address] [place], [place] [address]    Culture STREPTOCOCCUS GROUP C (A)  Final   Report Status 10/19/2020 FINAL  Final   Organism ID, Bacteria STREPTOCOCCUS GROUP C  Final      Susceptibility   Streptococcus group c - MIC*    CLINDAMYCIN <=0.25 SENSITIVE Sensitive     AMPICILLIN <=  0.25 SENSITIVE Sensitive     ERYTHROMYCIN <=0.12 SENSITIVE Sensitive     VANCOMYCIN 0.5 SENSITIVE Sensitive     CEFTRIAXONE <=0.12      LEVOFLOXACIN 1 SENSITIVE Sensitive     PENICILLIN Value in next row Sensitive      SENSITIVE0.06    * STREPTOCOCCUS GROUP C  Blood Culture ID Panel (Reflexed)     Status: Abnormal   Collection Time: 10/16/20  7:26 PM  Result Value Ref Range Status   Enterococcus faecalis NOT DETECTED NOT DETECTED Final   Enterococcus Faecium NOT DETECTED NOT DETECTED Final   Listeria monocytogenes NOT DETECTED NOT DETECTED Final   Staphylococcus species NOT DETECTED NOT DETECTED Final   Staphylococcus aureus (BCID) NOT DETECTED NOT DETECTED Final   Staphylococcus epidermidis NOT DETECTED NOT DETECTED Final   Staphylococcus lugdunensis NOT DETECTED NOT DETECTED Final   Streptococcus species DETECTED (A) NOT DETECTED Final    Comment: Not Enterococcus species, Streptococcus agalactiae, Streptococcus pyogenes, or Streptococcus pneumoniae. CRITICAL RESULT CALLED TO, READ BACK BY AND VERIFIED WITH: KRISTEN  MERRIL, PHARMD AT 0830 ON 10/17/20 BY GM    Streptococcus agalactiae NOT DETECTED NOT DETECTED Final   Streptococcus pneumoniae NOT DETECTED NOT DETECTED Final   Streptococcus pyogenes NOT DETECTED NOT DETECTED Final   A.calcoaceticus-baumannii NOT DETECTED NOT DETECTED Final   Bacteroides fragilis NOT DETECTED NOT DETECTED Final   Enterobacterales NOT DETECTED NOT DETECTED Final   Enterobacter cloacae complex NOT DETECTED NOT DETECTED Final   Escherichia coli NOT DETECTED NOT DETECTED Final   Klebsiella aerogenes NOT DETECTED NOT DETECTED Final   Klebsiella oxytoca NOT DETECTED NOT DETECTED Final   Klebsiella pneumoniae NOT DETECTED NOT DETECTED Final   Proteus species NOT DETECTED NOT DETECTED Final   Salmonella species NOT DETECTED NOT DETECTED Final   Serratia marcescens NOT DETECTED NOT DETECTED Final   Haemophilus influenzae NOT DETECTED NOT DETECTED Final   Neisseria meningitidis NOT DETECTED NOT DETECTED Final   Pseudomonas aeruginosa NOT DETECTED NOT DETECTED Final   Stenotrophomonas maltophilia NOT DETECTED NOT DETECTED Final   Candida albicans NOT DETECTED NOT DETECTED Final   Candida auris NOT DETECTED NOT DETECTED Final   Candida glabrata NOT DETECTED NOT DETECTED Final   Candida krusei NOT DETECTED NOT DETECTED Final   Candida parapsilosis NOT DETECTED NOT DETECTED Final   Candida tropicalis NOT DETECTED NOT DETECTED Final   Cryptococcus neoformans/gattii NOT DETECTED NOT DETECTED Final    Comment: Performed at [facility], [address] [place], [place] [address]  Blood culture (routine x 2)     Status: None   Collection Time: 10/16/20 11:23 PM   Specimen: BLOOD RIGHT FOREARM  Result Value Ref Range Status   Specimen Description BLOOD RIGHT FOREARM  Final   Special Requests   Final    BOTTLES DRAWN AEROBIC AND ANAEROBIC Blood Culture adequate volume   Culture   Final    NO GROWTH 5 DAYS Performed at [facility], [address]  [place], [place] [address]    Report Status 10/21/2020 FINAL  Final  CULTURE, BLOOD (ROUTINE X 2) w Reflex to ID Panel     Status: None (Preliminary result)   Collection Time: 10/19/20  1:11 AM   Specimen: BLOOD RIGHT FOREARM  Result Value Ref Range Status   Specimen Description BLOOD RIGHT FOREARM  Final   Special Requests   Final    BOTTLES DRAWN AEROBIC AND ANAEROBIC Blood Culture results may not be optimal due to an excessive volume  of blood received in culture bottles   Culture   Final    NO GROWTH 4 DAYS Performed at [facility], [address] [place], [place] [address]    Report Status PENDING  Incomplete  CULTURE, BLOOD (ROUTINE X 2) w Reflex to ID Panel     Status: None (Preliminary result)   Collection Time: 10/19/20  1:17 AM   Specimen: BLOOD RIGHT HAND  Result Value Ref Range Status   Specimen Description BLOOD RIGHT HAND  Final   Special Requests   Final    BOTTLES DRAWN AEROBIC AND ANAEROBIC Blood Culture adequate volume   Culture   Final    NO GROWTH 4 DAYS Performed at [facility], [address] [place], [place] [address]    Report Status PENDING  Incomplete     Studies: DG Abd 2 Views  Result Date: 10/23/2020 CLINICAL DATA:  Ileus. EXAM: ABDOMEN - 2 VIEW COMPARISON:  October 22, 2020. FINDINGS: No colonic dilatation is noted. Moderate amount of stool seen throughout the colon. Stable mildly dilated small bowel loops are noted which most consistent with ileus. There is no evidence of free air. No radio-opaque calculi or other significant radiographic abnormality is seen. IMPRESSION: Stable mildly dilated small bowel loops are noted most consistent with ileus. Moderate stool burden. Electronically Signed   By: Lupita Raider M.D.   On: 10/23/2020 08:06   DG Abd 2 Views  Result Date: 10/22/2020 CLINICAL DATA:  Ileus, abdominal pain and distention, inpatient EXAM: ABDOMEN - 2 VIEW COMPARISON:  10/21/2020 abdominal radiograph FINDINGS: A few mildly  dilated small bowel loops in the central abdomen, mildly improved. Moderate diffuse colonic stool and gas. No evidence of pneumatosis or pneumoperitoneum. No radiopaque nephrolithiasis. Marked lumbar spondylosis. Clear lung bases. Partially visualized bilateral total hip arthroplasty. IMPRESSION: A few mildly dilated small bowel loops in the central abdomen, mildly improved. Moderate diffuse colonic stool and gas, similar. Findings are compatible with improving mild adynamic ileus as reported. Electronically Signed   By: Delbert Phenix M.D.   On: 10/22/2020 09:29    Scheduled Meds:  apixaban  5 mg Oral BID   ascorbic acid  500 mg Oral Daily   benzonatate  100 mg Oral TID   budesonide (PULMICORT) nebulizer solution  0.5 mg Nebulization BID   carbidopa-levodopa  1 tablet Oral TID   Chlorhexidine Gluconate Cloth  6 each Topical Daily   escitalopram  5 mg Oral Daily   feeding supplement  237 mL Oral BID BM   finasteride  5 mg Oral Daily   fluticasone  1 spray Each Nare Daily   furosemide  20 mg Oral Daily   gabapentin  800 mg Oral BID   guaiFENesin  600 mg Oral Q12H   insulin aspart  0-5 Units Subcutaneous QHS   insulin aspart  0-9 Units Subcutaneous TID WC   iohexol  500 mL Oral Q1H   ipratropium  1 spray Each Nare BID   ipratropium-albuterol  3 mL Nebulization TID   lactase  3,000 Units Oral TID WC   loratadine  10 mg Oral Daily   metoprolol succinate  50 mg Oral Daily   montelukast  10 mg Oral QHS   polyethylene glycol  17 g Oral BID   senna-docusate  2 tablet Oral QHS   tamsulosin  0.4 mg Oral Daily   traZODone  100 mg Oral QHS   Continuous Infusions:  cefTRIAXone (ROCEPHIN)  IV 2 g (10/22/20 2138)  Assessment/Plan:  Ileus.  Patient still has not had bowel movements yet.  Only small smears.  We will get a CT scan of the abdomen pelvis to rule out obstruction.  Continue MiraLAX. Clinical sepsis present on admission with group C streptococcus growing out of blood cultures.  Prior  to discharge will need PICC line and 4 weeks of IV antibiotics.  Currently on Rocephin. COPD exacerbation.  Since respiratory status better will get rid of steroids and continue nebulizer treatments. Leakage of urine with suprapubic catheter and penis with coughing or vomiting.  Appreciate urology changing out suprapubic catheter. Neck pain.  Previous CT scan did not show any signs of infection. Right knee pain and right elbow pain.  No signs of septic joints Paroxysmal atrial fibrillation on Eliquis.  Continue metoprolol for rate control. Acute kidney injury on presentation with a creatinine of 1.66.  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  We will discontinue steroids tomorrow.  On sliding scale insulin.  Hemoglobin A1c pending Weakness.  Appreciate physical therapy evaluate        Code Status:     Code Status Orders  (From admission, onward)           Start     Ordered   10/16/20 1956  Full code  Continuous        10/16/20 2006           Code Status History     This patient has a current code status but no historical code status.      Family Communication: Spoke with sister on the phone Disposition Plan: Status is: Inpatient  Dispo: The patient is from: Rehab              Anticipated d/c is to: Rehab              Patient currently still with ileus and not had a bowel movement.   Difficult to place patient.  No.  Consultants: Infectious disease Urology for suprapubic change  Antibiotics: Rocephin  Time spent: 27 minutes  Leandro Ber[employer]

## 2020-10-24 ENCOUNTER — Inpatient Hospital Stay: Payer: Self-pay

## 2020-10-24 DIAGNOSIS — J441 Chronic obstructive pulmonary disease with (acute) exacerbation: Secondary | ICD-10-CM | POA: Diagnosis not present

## 2020-10-24 DIAGNOSIS — A408 Other streptococcal sepsis: Secondary | ICD-10-CM | POA: Diagnosis not present

## 2020-10-24 DIAGNOSIS — K567 Ileus, unspecified: Secondary | ICD-10-CM | POA: Diagnosis not present

## 2020-10-24 DIAGNOSIS — M542 Cervicalgia: Secondary | ICD-10-CM | POA: Diagnosis not present

## 2020-10-24 LAB — GLUCOSE, CAPILLARY
Glucose-Capillary: 115 mg/dL — ABNORMAL HIGH (ref 70–99)
Glucose-Capillary: 195 mg/dL — ABNORMAL HIGH (ref 70–99)
Glucose-Capillary: 236 mg/dL — ABNORMAL HIGH (ref 70–99)
Glucose-Capillary: 153 mg/dL — ABNORMAL HIGH (ref 70–99)

## 2020-10-24 LAB — CULTURE, BLOOD (ROUTINE X 2)
Culture: NO GROWTH
Culture: NO GROWTH
Special Requests: ADEQUATE

## 2020-10-24 MED ORDER — SODIUM CHLORIDE 0.9% FLUSH
10.0000 mL | Freq: Two times a day (BID) | INTRAVENOUS | Status: DC
Start: 2020-10-24 — End: 2020-11-01
  Administered 2020-10-24 – 2020-10-31 (×12): 10 mL

## 2020-10-24 MED ORDER — DICLOFENAC SODIUM 1 % EX GEL
4.0000 g | Freq: Four times a day (QID) | CUTANEOUS | Status: DC
  Administered 2020-10-24 – 2020-11-01 (×28): 4 g via TOPICAL
  Filled 2020-10-24: qty 100

## 2020-10-24 MED ORDER — SODIUM CHLORIDE 0.9% FLUSH: 10.0000 mL | INTRAVENOUS | Status: DC | PRN

## 2020-10-24 MED ORDER — LACTULOSE 10 GM/15ML PO SOLN
30.0000 g | Freq: Every day | ORAL | Status: DC
  Filled 2020-10-24 (×3): qty 60

## 2020-10-24 NOTE — Progress Notes (Signed)
Nutrition Follow-up  DOCUMENTATION CODES:  Obesity unspecified  INTERVENTION:  Liberalize to carb modified to allow for more choices Ensure Enlive po BID, each supplement provides 350 kcal and 20 grams of protein Request new measured weight  NUTRITION DIAGNOSIS:  Inadequate oral intake related to poor appetite as evidenced by per patient/family report.  GOAL:  Patient will meet greater than or equal to 90% of their needs  MONITOR:  PO intake, Weight trends, Supplement acceptance  REASON FOR ASSESSMENT:  Consult Assessment of nutrition requirement/status  ASSESSMENT:  Pt presented to ED from [facility] for weakness worsening x 4 days. PMH relevant for A. Fib, COPD, HTN, HLD, urinary retention with chronic suprapubic catheter, and DM.  Pt complaining of right knee and elbow pain. Orthopedics evaluated on 7/20 and determined presentation was not consistent with septic joint and TEE negative for endocarditis.   Since last assessment, urology exchanged suprapubic catheter due to leaking. Was on a liquid diet for several days due to nausea and vomiting, MD advanced back to solid foods this AM.  Poor intake noted over the last week, ok to liberalize diet to carb modified per MD to allow for more choices.  CM working on placement,. Will return to [facility] at discharge where he is a long term resident.    Average Meal Intake: 7/18-7/19: 70% intake x 1 recorded meal 7/20-7/26: 31% intake x 8 recorded meals (0-100%)  Nutritionally Relevant Medications: Scheduled Meds:  ascorbic acid  500 mg Oral Daily   feeding supplement  237 mL Oral BID BM   furosemide  20 mg Oral Daily   insulin aspart  0-5 Units Subcutaneous QHS   insulin aspart  0-9 Units Subcutaneous TID WC   lactase  3,000 Units Oral TID WC   polyethylene glycol  17 g Oral BID   senna-docusate  2 tablet Oral QHS   Continuous Infusions:  cefTRIAXone (ROCEPHIN)  IV 2 g (10/23/20 2030)   PRN Meds: bisacodyl,  calcium carbonate, magnesium hydroxide, ondansetron   Labs Reviewed: BUN 26 (7/25) SBG ranges from 114-318 mg/dL over the last 24 hours  NUTRITION - FOCUSED PHYSICAL EXAM: Flowsheet Row Most Recent Value  Orbital Region No depletion  Upper Arm Region No depletion  Thoracic and Lumbar Region No depletion  Buccal Region No depletion  Temple Region No depletion  Clavicle Bone Region No depletion  Clavicle and Acromion Bone Region No depletion  Scapular Bone Region No depletion  Dorsal Hand No depletion  Patellar Region No depletion  Anterior Thigh Region No depletion  Posterior Calf Region No depletion  Edema (RD Assessment) Mild  Hair Reviewed  Eyes Reviewed  Mouth Reviewed  Skin Reviewed  Nails Reviewed   Diet Order:   Diet Order             Diet Carb Modified Fluid consistency: Thin; Room service appropriate? Yes  Diet effective now                   EDUCATION NEEDS:  No education needs have been identified at this time  Skin:  Skin Assessment: Reviewed RN Assessment  Last BM:  7/25 - type 6  Height:  Ht Readings from Last 1 Encounters:  03/30/20 5\' 7"  (1.702 m)    Weight:  Wt Readings from Last 1 Encounters:  10/16/20 103 kg    Ideal Body Weight:  67.3 kg  BMI:  Body mass index is 35.55 kg/m.  Estimated Nutritional Needs:  Kcal:  1800-2000  kcal/d Protein:  90-100g/d Fluid:  1.8-2 L/d  Greig Castilla, RD, LDN Clinical Dietitian Pager on Amion

## 2020-10-24 NOTE — Progress Notes (Signed)
Peripherally Inserted Central Catheter Placement  The IV Nurse has discussed with the patient and/or persons authorized to consent for the patient, the purpose of this procedure and the potential benefits and risks involved with this procedure.  The benefits include less needle sticks, lab draws from the catheter, and the patient may be discharged home with the catheter. Risks include, but not limited to, infection, bleeding, blood clot (thrombus formation), and puncture of an artery; nerve damage and irregular heartbeat and possibility to perform a PICC exchange if needed/ordered by physician.  Alternatives to this procedure were also discussed.  Bard Power PICC patient education guide, fact sheet on infection prevention and patient information card has been provided to patient /or left at bedside.    PICC Placement Documentation  PICC Single Lumen 10/24/20 Right Basilic 39 cm 0 cm (Active)  Indication for Insertion or Continuance of Line Prolonged intravenous therapies 10/24/20 1220  Exposed Catheter (cm) 0 cm 10/24/20 1220  Site Assessment Clean;Dry;Intact 10/24/20 1220  Line Status Flushed;Blood return noted;Saline locked 10/24/20 1220  Dressing Type Transparent 10/24/20 1220  Dressing Status Clean;Dry;Intact 10/24/20 1220  Antimicrobial disc in place? Yes 10/24/20 1220  Dressing Intervention New dressing;Other (Comment) 10/24/20 1220  Dressing Change Due 10/31/20 10/24/20 1220       Reginia Forts Albarece 10/24/2020, 12:21 PM

## 2020-10-24 NOTE — Progress Notes (Signed)
Patient ID: Clyda GreenerSteven Micek, male   DOB: 10/12/1945, 75 y.o.   MRN: [id number] [facility] PROGRESS NOTE  Clyda GreenerSteven Howdeshell YQM:[id number] DOB: 03/21/1946 DOA: 10/16/2020 PCP: [facility]  HPI/Subjective: Patient complains of some right knee and neck pain.  He states that he has been on Naprosyn gel before.  Initially admitted with sepsis.  Patient developed ileus during the hospital course.  Finally had a bowel movement this morning.   Objective: Vitals:   10/24/20 1404 10/24/20 1634  BP:  112/65  Pulse:  80  Resp:  16  Temp:  98.2 F (36.8 C)  SpO2: 93% 94%    Intake/Output Summary (Last 24 hours) at 10/24/2020 1700 Last data filed at 10/24/2020 0500 Gross per 24 hour  Intake --  Output 800 ml  Net -800 ml   Filed Weights   10/16/20 1534  Weight: 103 kg    ROS: Review of Systems  Respiratory:  Negative for shortness of breath.   Cardiovascular:  Negative for chest pain.  Gastrointestinal:  Negative for abdominal pain, nausea and vomiting.  Musculoskeletal:  Positive for joint pain and neck pain.  Exam: Physical Exam HENT:     Head: Normocephalic.     Mouth/Throat:     Pharynx: No oropharyngeal exudate.  Eyes:     General: Lids are normal.     Conjunctiva/sclera: Conjunctivae normal.  Cardiovascular:     Rate and Rhythm: Normal rate and regular rhythm.     Heart sounds: Normal heart sounds, S1 normal and S2 normal.  Pulmonary:     Breath sounds: Examination of the right-lower field reveals decreased breath sounds. Examination of the left-lower field reveals decreased breath sounds. Decreased breath sounds present. No wheezing, rhonchi or rales.  Abdominal:     General: There is distension.     Palpations: Abdomen is soft.     Tenderness: There is no abdominal tenderness.  Musculoskeletal:     Right lower leg: Swelling present.     Left lower leg: Swelling present.  Skin:    General: Skin is warm.     Findings: No rash.  Neurological:     Mental Status: He is  alert and oriented to person, place, and time.     Data Reviewed: Basic Metabolic Panel: Recent Labs  Lab 10/19/20 0112 10/21/20 0545 10/22/20 0527 10/23/20 0642  NA 135 136 138 137  K 5.1 4.9 4.4 4.5  CL 104 106 105 103  CO2 24 23 25 28   GLUCOSE 208* 228* 131* 140*  BUN 54* 46* 35* 26*  CREATININE 1.19 0.96 0.85 0.75  CALCIUM 8.7* 8.6* 8.4* 8.5*    CBC: Recent Labs  Lab 10/19/20 0112 10/21/20 0545  WBC 13.0* 9.9  HGB 10.0* 11.4*  HCT 30.9* 35.2*  MCV 88.3 85.9  PLT 217 311   BNP (last 3 results) Recent Labs    12/27/19 1605  BNP 102.6*     CBG: Recent Labs  Lab 10/23/20 1656 10/23/20 2141 10/24/20 0740 10/24/20 1242 10/24/20 1642  GLUCAP 318* 232* 115* 195* 236*    Recent Results (from the past 240 hour(s))  Resp Panel by RT-PCR (Flu A&B, Covid) Nasopharyngeal Swab     Status: None   Collection Time: 10/16/20  3:36 PM   Specimen: Nasopharyngeal Swab; Nasopharyngeal(NP) swabs in vial transport medium  Result Value Ref Range Status   SARS Coronavirus 2 by RT PCR NEGATIVE NEGATIVE Final    Comment: (NOTE) SARS-CoV-2 target nucleic acids are NOT DETECTED.  The  SARS-CoV-2 RNA is generally detectable in upper respiratory specimens during the acute phase of infection. The lowest concentration of SARS-CoV-2 viral copies this assay can detect is 138 copies/mL. A negative result does not preclude SARS-Cov-2 infection and should not be used as the sole basis for treatment or other patient management decisions. A negative result may occur with  improper specimen collection/handling, submission of specimen other than nasopharyngeal swab, presence of viral mutation(s) within the areas targeted by this assay, and inadequate number of viral copies(<138 copies/mL). A negative result must be combined with clinical observations, patient history, and epidemiological information. The expected result is Negative.  Fact Sheet for Patients:   BloggerCourse.com  Fact Sheet for Healthcare Providers:  SeriousBroker.it  This test is no t yet approved or cleared by the [place] FDA and  has been authorized for detection and/or diagnosis of SARS-CoV-2 by FDA under an Emergency Use Authorization (EUA). This EUA will remain  in effect (meaning this test can be used) for the duration of the COVID-19 declaration under Section 564(b)(1) of the Act, 21 U.S.C.section 360bbb-3(b)(1), unless the authorization is terminated  or revoked sooner.       Influenza A by PCR NEGATIVE NEGATIVE Final   Influenza B by PCR NEGATIVE NEGATIVE Final    Comment: (NOTE) The Xpert Xpress SARS-CoV-2/FLU/RSV plus assay is intended as an aid in the diagnosis of influenza from Nasopharyngeal swab specimens and should not be used as a sole basis for treatment. Nasal washings and aspirates are unacceptable for Xpert Xpress SARS-CoV-2/FLU/RSV testing.  Fact Sheet for Patients: BloggerCourse.com  Fact Sheet for Healthcare Providers: SeriousBroker.it  This test is not yet approved or cleared by the [place] FDA and has been authorized for detection and/or diagnosis of SARS-CoV-2 by FDA under an Emergency Use Authorization (EUA). This EUA will remain in effect (meaning this test can be used) for the duration of the COVID-19 declaration under Section 564(b)(1) of the Act, 21 U.S.C. section 360bbb-3(b)(1), unless the authorization is terminated or revoked.  Performed at [facility], [address] [place], [place] [address]   Blood culture (routine x 2)     Status: Abnormal   Collection Time: 10/16/20  7:26 PM   Specimen: BLOOD LEFT FOREARM  Result Value Ref Range Status   Specimen Description   Final    BLOOD LEFT FOREARM Performed at [facility], [address], [place], [place] [address]    Special Requests   Final     BOTTLES DRAWN AEROBIC AND ANAEROBIC Blood Culture adequate volume Performed at [facility], [address], [place], [place] [address]    Culture  Setup Time   Final    GRAM POSITIVE COCCI IN BOTH AEROBIC AND ANAEROBIC BOTTLES CRITICAL RESULT CALLED TO, READ BACK BY AND VERIFIED WITH: KRISTEN MERRIL, PHARMD AT 0830 ON 10/17/20 BY GM Performed at [facility], [address], [place], [place] [address]    Culture STREPTOCOCCUS GROUP C (A)  Final   Report Status 10/19/2020 FINAL  Final   Organism ID, Bacteria STREPTOCOCCUS GROUP C  Final      Susceptibility   Streptococcus group c - MIC*    CLINDAMYCIN <=0.25 SENSITIVE Sensitive     AMPICILLIN <=0.25 SENSITIVE Sensitive     ERYTHROMYCIN <=0.12 SENSITIVE Sensitive     VANCOMYCIN 0.5 SENSITIVE Sensitive     CEFTRIAXONE <=0.12      LEVOFLOXACIN 1 SENSITIVE Sensitive     PENICILLIN Value in next row Sensitive  SENSITIVE0.06    * STREPTOCOCCUS GROUP C  Blood Culture ID Panel (Reflexed)     Status: Abnormal   Collection Time: 10/16/20  7:26 PM  Result Value Ref Range Status   Enterococcus faecalis NOT DETECTED NOT DETECTED Final   Enterococcus Faecium NOT DETECTED NOT DETECTED Final   Listeria monocytogenes NOT DETECTED NOT DETECTED Final   Staphylococcus species NOT DETECTED NOT DETECTED Final   Staphylococcus aureus (BCID) NOT DETECTED NOT DETECTED Final   Staphylococcus epidermidis NOT DETECTED NOT DETECTED Final   Staphylococcus lugdunensis NOT DETECTED NOT DETECTED Final   Streptococcus species DETECTED (A) NOT DETECTED Final    Comment: Not Enterococcus species, Streptococcus agalactiae, Streptococcus pyogenes, or Streptococcus pneumoniae. CRITICAL RESULT CALLED TO, READ BACK BY AND VERIFIED WITH: KRISTEN MERRIL, PHARMD AT 0830 ON 10/17/20 BY GM    Streptococcus agalactiae NOT DETECTED NOT DETECTED Final   Streptococcus pneumoniae NOT DETECTED NOT DETECTED Final   Streptococcus pyogenes NOT DETECTED NOT  DETECTED Final   A.calcoaceticus-baumannii NOT DETECTED NOT DETECTED Final   Bacteroides fragilis NOT DETECTED NOT DETECTED Final   Enterobacterales NOT DETECTED NOT DETECTED Final   Enterobacter cloacae complex NOT DETECTED NOT DETECTED Final   Escherichia coli NOT DETECTED NOT DETECTED Final   Klebsiella aerogenes NOT DETECTED NOT DETECTED Final   Klebsiella oxytoca NOT DETECTED NOT DETECTED Final   Klebsiella pneumoniae NOT DETECTED NOT DETECTED Final   Proteus species NOT DETECTED NOT DETECTED Final   Salmonella species NOT DETECTED NOT DETECTED Final   Serratia marcescens NOT DETECTED NOT DETECTED Final   Haemophilus influenzae NOT DETECTED NOT DETECTED Final   Neisseria meningitidis NOT DETECTED NOT DETECTED Final   Pseudomonas aeruginosa NOT DETECTED NOT DETECTED Final   Stenotrophomonas maltophilia NOT DETECTED NOT DETECTED Final   Candida albicans NOT DETECTED NOT DETECTED Final   Candida auris NOT DETECTED NOT DETECTED Final   Candida glabrata NOT DETECTED NOT DETECTED Final   Candida krusei NOT DETECTED NOT DETECTED Final   Candida parapsilosis NOT DETECTED NOT DETECTED Final   Candida tropicalis NOT DETECTED NOT DETECTED Final   Cryptococcus neoformans/gattii NOT DETECTED NOT DETECTED Final    Comment: Performed at [facility], [address] [place], [place] [address]  Blood culture (routine x 2)     Status: None   Collection Time: 10/16/20 11:23 PM   Specimen: BLOOD RIGHT FOREARM  Result Value Ref Range Status   Specimen Description BLOOD RIGHT FOREARM  Final   Special Requests   Final    BOTTLES DRAWN AEROBIC AND ANAEROBIC Blood Culture adequate volume   Culture   Final    NO GROWTH 5 DAYS Performed at [facility], [address] [place], [place] [address]    Report Status 10/21/2020 FINAL  Final  CULTURE, BLOOD (ROUTINE X 2) w Reflex to ID Panel     Status: None   Collection Time: 10/19/20  1:11 AM   Specimen: BLOOD RIGHT FOREARM  Result  Value Ref Range Status   Specimen Description BLOOD RIGHT FOREARM  Final   Special Requests   Final    BOTTLES DRAWN AEROBIC AND ANAEROBIC Blood Culture results may not be optimal due to an excessive volume of blood received in culture bottles   Culture   Final    NO GROWTH 5 DAYS Performed at [facility], [address] [place], [place] [address]    Report Status 10/24/2020 FINAL  Final  CULTURE, BLOOD (ROUTINE X 2) w Reflex to ID Panel  Status: None   Collection Time: 10/19/20  1:17 AM   Specimen: BLOOD RIGHT HAND  Result Value Ref Range Status   Specimen Description BLOOD RIGHT HAND  Final   Special Requests   Final    BOTTLES DRAWN AEROBIC AND ANAEROBIC Blood Culture adequate volume   Culture   Final    NO GROWTH 5 DAYS Performed at [facility], [address] [place], [place] [address]    Report Status 10/24/2020 FINAL  Final     Studies: CT ABDOMEN PELVIS WO CONTRAST  Result Date: 10/23/2020 CLINICAL DATA:  75 year old male with concern for bowel obstruction. EXAM: CT ABDOMEN AND PELVIS WITHOUT CONTRAST TECHNIQUE: Multidetector CT imaging of the abdomen and pelvis was performed following the standard protocol without IV contrast. COMPARISON:  Abdominal radiograph dated 10/23/2020. FINDINGS: Evaluation of this exam is limited in the absence of intravenous contrast. Lower chest: Trace bilateral pleural effusions with minimal bibasilar dependent atelectasis. There is coronary vascular calcification. No intra-abdominal free air or free fluid. Hepatobiliary: There is a 2 cm right liver hypodense lesion which is suboptimally characterized on this CT but demonstrates fluid attenuation most consistent with a cyst. No intrahepatic biliary ductal dilatation. Small gallstone. No pericholecystic fluid or evidence of acute cholecystitis by CT. Pancreas: Unremarkable. No pancreatic ductal dilatation or surrounding inflammatory changes. Spleen: Normal in size without focal  abnormality. Adrenals/Urinary Tract: The adrenal glands unremarkable. Mild bilateral renal parenchyma atrophy. There is a punctate nonobstructing left renal inferior pole calculus. No hydronephrosis. There is no hydronephrosis or nephrolithiasis on the right. A 16 mm exophytic hypodense lesion from the posterior interpolar left kidney demonstrates fluid attenuation most consistent with cysts. Several additional smaller hypodense lesions are not characterized on this noncontrast CT. The visualized ureters appear unremarkable. The urinary bladder is decompressed around a suprapubic catheter. Small amount of air within the bladder likely introduced via the catheter. Stomach/Bowel: There is sigmoid diverticulosis without active inflammatory changes. There is moderate amount of stool throughout the colon. There is no bowel obstruction or active inflammation. Appendectomy. Vascular/Lymphatic: Mild aortoiliac atherosclerotic disease. The IVC is unremarkable. No portal venous gas. There is no adenopathy. Reproductive: The prostate gland is poorly visualized but grossly unremarkable. Other: Mild diffuse subcutaneous edema of the pelvis. No fluid collection. Musculoskeletal: Total bilateral hip arthroplasties. There is no acute fracture or dislocation. There is advanced osteopenia. Extensive degenerative changes of the spine. IMPRESSION: 1. No acute intra-abdominal or pelvic pathology. No bowel obstruction. 2. Sigmoid diverticulosis. 3. Cholelithiasis. 4. Aortic Atherosclerosis (ICD10-I70.0). Electronically Signed   By: Elgie Collard M.D.   On: 10/23/2020 21:52   DG Abd 2 Views  Result Date: 10/23/2020 CLINICAL DATA:  Ileus. EXAM: ABDOMEN - 2 VIEW COMPARISON:  October 22, 2020. FINDINGS: No colonic dilatation is noted. Moderate amount of stool seen throughout the colon. Stable mildly dilated small bowel loops are noted which most consistent with ileus. There is no evidence of free air. No radio-opaque calculi or other  significant radiographic abnormality is seen. IMPRESSION: Stable mildly dilated small bowel loops are noted most consistent with ileus. Moderate stool burden. Electronically Signed   By: Lupita Raider M.D.   On: 10/23/2020 08:06   [place] EKG SITE RITE  Result Date: 10/24/2020 If Site Rite image not attached, placement could not be confirmed due to current cardiac rhythm.   Scheduled Meds:  apixaban  5 mg Oral BID   ascorbic acid  500 mg Oral Daily   benzonatate  100 mg Oral TID  budesonide (PULMICORT) nebulizer solution  0.5 mg Nebulization BID   carbidopa-levodopa  1 tablet Oral TID   Chlorhexidine Gluconate Cloth  6 each Topical Daily   diclofenac Sodium  4 g Topical QID   escitalopram  5 mg Oral Daily   feeding supplement  237 mL Oral BID BM   finasteride  5 mg Oral Daily   fluticasone  1 spray Each Nare Daily   furosemide  20 mg Oral Daily   gabapentin  800 mg Oral BID   guaiFENesin  600 mg Oral Q12H   insulin aspart  0-5 Units Subcutaneous QHS   insulin aspart  0-9 Units Subcutaneous TID WC   ipratropium  1 spray Each Nare BID   ipratropium-albuterol  3 mL Nebulization TID   lactase  3,000 Units Oral TID WC   lactulose  30 g Oral Daily   loratadine  10 mg Oral Daily   metoprolol succinate  50 mg Oral Daily   montelukast  10 mg Oral QHS   polyethylene glycol  17 g Oral BID   senna-docusate  2 tablet Oral QHS   sodium chloride flush  10-40 mL Intracatheter Q12H   tamsulosin  0.4 mg Oral Daily   traZODone  100 mg Oral QHS   Continuous Infusions:  cefTRIAXone (ROCEPHIN)  IV 2 g (10/23/20 2030)   Brief history.  Patient admitted 10/16/2020 with weakness and sepsis.  Blood cultures growing group C Streptococcus.  Infectious disease specialist has on IV Rocephin.  Patient had neck, right knee and right elbow pain.  Orthopedic surgery did not believe the right knee or elbow was septic.  Neck imaging did not show any osteomyelitis but does have old hardware there.  The patient  developed ileus during the hospital course.  Patient finally had a bowel movement on 10/24/2020 and diet advanced to solid food.  PICC line placed.  Will need 4 weeks of IV antibiotics total.  Assessment/Plan:  Ileus.  Patient finally had a bowel movement today.  Continue MiraLAX twice daily and senna Colace at night.  CT scan negative for obstruction.  Advance to solid food today.  If doing well with regards to tolerating food may be able to get out of the hospital tomorrow to rehab. Clinical sepsis, present on admission with group C streptococcus growing out of the blood cultures.  PICC line placed today.  Will need 4 weeks total of IV antibiotics.  Currently on Rocephin.  Seen by infectious disease. COPD exacerbation.  Since respiratory status improved, I got rid of steroids.  Continue nebulizer treatments. Leakage of the urine with suprapubic catheter through the penis.  I had urology changed out his suprapubic catheter on 10/23/2020.  Continue changing monthly. Neck pain.  CT scan did not show any signs of infection.  Diclofenac gel ordered. Right knee pain and right elbow pain.  No signs of septic joints as per orthopedic surgery.  Diclofenac gel ordered for right knee. Paroxysmal atrial fibrillation on Eliquis for anticoagulation.  Continue metoprolol for rate control. Acute kidney injury on presentation with a creatinine of 1.66.  Creatinine 0.75 as of yesterday. 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  Hemoglobin A1c 6.6.  Also diabetic neuropathy on gabapentin. Weakness.  Appreciate physical therapy evaluation.  Will need physical therapy back at his facility.     Code Status:     Code Status Orders  (From admission, onward)  Start     Ordered   10/16/20 1956  Full code  Continuous        10/16/20 2006           Code Status History     This patient has a current code  status but no historical code status.      Family Communication: Spoke with sister Eunice Blase on the phone Disposition Plan: Status is: Inpatient  Dispo: The patient is from: Rehab              Anticipated d/c is to: Rehab potentially tomorrow if tolerates solid food today              Patient currently finally had a bowel movement so I think ileus is resolving.  Advance to solid food today.   Difficult to place patient.  No.  Consultants: Infectious disease  Procedures: Suprapubic catheter changed out by urology on 10/23/2020  Antibiotics: IV Rocephin through 11/13/2020 as per ID  Time spent: 27 minutes  [employer]

## 2020-10-24 NOTE — Plan of Care (Signed)
Resting quietly with eyes closed. Bed in lowest position. Wheels locked. CT completed. No BM on this shift. Suppository administered. Patient with urinary leakage from his penis during the night.    Problem: Education: Goal: Knowledge of General Education information will improve Description: Including pain rating scale, medication(s)/side effects and non-pharmacologic comfort measures Outcome: Progressing   Problem: Health Behavior/Discharge Planning: Goal: Ability to manage health-related needs will improve Outcome: Progressing   Problem: Clinical Measurements: Goal: Ability to maintain clinical measurements within normal limits will improve Outcome: Progressing Goal: Will remain free from infection Outcome: Progressing Goal: Diagnostic test results will improve Outcome: Progressing Goal: Respiratory complications will improve Outcome: Progressing Goal: Cardiovascular complication will be avoided Outcome: Progressing   Problem: Activity: Goal: Risk for activity intolerance will decrease Outcome: Progressing   Problem: Nutrition: Goal: Adequate nutrition will be maintained Outcome: Progressing   Problem: Coping: Goal: Level of anxiety will decrease Outcome: Progressing   Problem: Elimination: Goal: Will not experience complications related to bowel motility Outcome: Progressing Goal: Will not experience complications related to urinary retention Outcome: Progressing   Problem: Pain Managment: Goal: General experience of comfort will improve Outcome: Progressing   Problem: Safety: Goal: Ability to remain free from injury will improve Outcome: Progressing   Problem: Skin Integrity: Goal: Risk for impaired skin integrity will decrease Outcome: Progressing

## 2020-10-25 DIAGNOSIS — B955 Unspecified streptococcus as the cause of diseases classified elsewhere: Secondary | ICD-10-CM | POA: Diagnosis not present

## 2020-10-25 DIAGNOSIS — R7881 Bacteremia: Secondary | ICD-10-CM | POA: Diagnosis not present

## 2020-10-25 LAB — GLUCOSE, CAPILLARY
Glucose-Capillary: 148 mg/dL — ABNORMAL HIGH (ref 70–99)
Glucose-Capillary: 185 mg/dL — ABNORMAL HIGH (ref 70–99)
Glucose-Capillary: 184 mg/dL — ABNORMAL HIGH (ref 70–99)
Glucose-Capillary: 174 mg/dL — ABNORMAL HIGH (ref 70–99)

## 2020-10-25 LAB — CBC
HCT: 30.5 % — ABNORMAL LOW (ref 39.0–52.0)
Hemoglobin: 9.9 g/dL — ABNORMAL LOW (ref 13.0–17.0)
MCH: 28.3 pg (ref 26.0–34.0)
MCHC: 32.5 g/dL (ref 30.0–36.0)
MCV: 87.1 fL (ref 80.0–100.0)
Platelets: 393 10*3/uL (ref 150–400)
RBC: 3.5 MIL/uL — ABNORMAL LOW (ref 4.22–5.81)
RDW: 15.9 % — ABNORMAL HIGH (ref 11.5–15.5)
WBC: 12.3 10*3/uL — ABNORMAL HIGH (ref 4.0–10.5)
nRBC: 0 % (ref 0.0–0.2)

## 2020-10-25 LAB — SARS CORONAVIRUS 2 (TAT 6-24 HRS): SARS Coronavirus 2: NEGATIVE

## 2020-10-25 LAB — OCCULT BLOOD X 1 CARD TO LAB, STOOL: Fecal Occult Bld: POSITIVE — AB

## 2020-10-25 MED ORDER — METHOCARBAMOL 500 MG PO TABS
500.0000 mg | ORAL_TABLET | Freq: Three times a day (TID) | ORAL | Status: DC
  Administered 2020-10-25 – 2020-11-01 (×20): 500 mg via ORAL
  Filled 2020-10-25 (×20): qty 1

## 2020-10-25 MED ORDER — LIDOCAINE 5 % EX PTCH
1.0000 | MEDICATED_PATCH | CUTANEOUS | Status: DC
  Administered 2020-10-25 – 2020-11-01 (×9): 1 via TRANSDERMAL
  Filled 2020-10-25 (×8): qty 1

## 2020-10-25 MED ORDER — CEFAZOLIN SODIUM-DEXTROSE 2-4 GM/100ML-% IV SOLN
2.0000 g | Freq: Three times a day (TID) | INTRAVENOUS | Status: DC
  Administered 2020-10-25 – 2020-10-30 (×14): 2 g via INTRAVENOUS
  Filled 2020-10-25 (×14): qty 100

## 2020-10-25 NOTE — Progress Notes (Signed)
PT Cancellation Note  Patient Details Name: Raymond Hays MRN: [id number] DOB: 08-16-1945   Cancelled Treatment:    Reason Eval/Treat Not Completed: Fatigue/lethargy limiting ability to participate   Pt offered session this am.  Stated he was just finished with OT session and would like to rest.  "Maybe later."  Will try to accommodate requests with another therapist later in the day.  Danielle Dess 10/25/2020, 11:47 AM

## 2020-10-25 NOTE — Progress Notes (Signed)
Occupational Therapy Treatment Patient Details Name: Raymond Hays MRN: [id number] DOB: 01/03/1946 Today's Date: 10/25/2020    History of present illness Raymond Hays is a 74yoM who comes to [facility] on 7/18 from [facility] with 4 days weakness, DOE. PMH: DM2, HTN, COPD, GAD, PND, PAF. Pt admitted c ARI, sepsis, AKI, dehydration. At baseline pt is able to perform supervised transfers bed to/from [facility], able to perform intermittent limited self propulsion in WC. Pt reportedly could AMB up to 36ft with therapy team using a RW.   OT comments  Pt seen for OT treatment on this date. Upon arrival to room, pt supine in bed reporting 5/10 pain at rest and 10/10 pain with bed mobility. Pt declining bed mobility/OOB mobility this date d/t pain following recent bed mobility for bathing with NT, however agreeable to bed-level ADLs and therapy exercises. Pt was able to perform oral care with MIN A and wash/dry face with SET-UP assist. Pt engaged in bed-level UE/LE exercises consisting of shoulder abduction, elbow flexion/extension, ankle pumps, heel raises, hip abduction/adduction, and straight leg raises. Pt continues to benefit from skilled OT services to maximize return to PLOF and minimize risk of future falls, injury, caregiver burden, and readmission. Will continue to follow POC. Discharge recommendation remains appropriate.     Follow Up Recommendations  SNF    Equipment Recommendations  Other (comment) (defer to next venue of care)       Precautions / Restrictions Precautions Precautions: Fall Restrictions Weight Bearing Restrictions: No       Mobility Bed Mobility               General bed mobility comments: Pt declining all mobility d/t pain    Transfers                 General transfer comment: Pt declining all mobility d/t pain    Balance                                           ADL either performed or assessed with clinical judgement   ADL Overall  ADL's : Needs assistance/impaired     Grooming: Oral care;Minimal assistance;Wash/dry face;Set up;Bed level Grooming Details (indicate cue type and reason): While in semi-fowler's position (pt unable to tolerate HOB elevated further d/t neck pain), pt required MIN A to apply toothpaste to toothbrush. Pt able to bring toothbrush to mouth to brush and bring washcloth to dry face                                     Cognition Arousal/Alertness: Awake/alert Behavior During Therapy: [facility] for tasks assessed/performed Overall Cognitive Status: Within Functional Limits for tasks assessed                                          Exercises General Exercises - Upper Extremity Shoulder ABduction: AROM;Both;10 reps;Supine Elbow Flexion: AROM;Strengthening;10 reps;Supine General Exercises - Lower Extremity Ankle Circles/Pumps: AROM;Both;10 reps;Supine Hip ABduction/ADduction: AAROM;Both;10 reps;Supine Straight Leg Raises: Strengthening;Both;5 reps Heel Raises: AAROM;5 reps;Supine   Shoulder Instructions       General Comments      Pertinent Vitals/ Pain       Pain Assessment: 0-10  Pain Score: 10-Worst pain ever Pain Location: R knee, neck Pain Descriptors / Indicators: Aching;Discomfort;Grimacing Pain Intervention(s): Limited activity within patient's tolerance;Monitored during session;Repositioned   Frequency  Min 1X/week        Progress Toward Goals  OT Goals(current goals can now be found in the care plan section)  Progress towards OT goals: Progressing toward goals  Acute Rehab OT Goals Patient Stated Goal: be able to perform WC transfers again, have less pain OT Goal Formulation: With patient Time For Goal Achievement: 10/31/20 Potential to Achieve Goals: Good  Plan Discharge plan remains appropriate;Frequency remains appropriate       AM-PAC OT "6 Clicks" Daily Activity     Outcome Measure   Help from another person eating meals?: A  Little Help from another person taking care of personal grooming?: A Little Help from another person toileting, which includes using toliet, bedpan, or urinal?: Total Help from another person bathing (including washing, rinsing, drying)?: A Lot Help from another person to put on and taking off regular upper body clothing?: A Lot Help from another person to put on and taking off regular lower body clothing?: A Lot 6 Click Score: 13    End of Session Equipment Utilized During Treatment: Oxygen;Gait belt;Rolling walker  OT Visit Diagnosis: Muscle weakness (generalized) (M62.81);Pain;Unsteadiness on feet (R26.81) Pain - Right/Left: Left Pain - part of body: Shoulder;Arm;Knee (neck)   Activity Tolerance Patient limited by pain   Patient Left in bed;with call bell/phone within reach;with bed alarm set;with nursing/sitter in room   Nurse Communication Mobility status        Time: 9242-6834 OT Time Calculation (min): 23 min  Charges: OT General Charges $OT Visit: 1 Visit OT Treatments $Self Care/Home Management : 8-22 mins $Therapeutic Activity: 8-22 mins  Matthew Folks, OTR/L [facility] [phone]

## 2020-10-25 NOTE — Progress Notes (Signed)
Date of Admission:  10/16/2020     ID: Raymond Hays is a 75 y.o. male  Active Problems:   AF (paroxysmal atrial fibrillation) (HCC)   Acute pain of right knee   COPD with acute exacerbation (HCC)   Sepsis (HCC)   Neck pain   AKI (acute kidney injury) (HCC)   Type 2 diabetes mellitus with diabetic neuropathy, without long-term current use of insulin (HCC)   Parkinson disease (HCC)   Ileus (HCC)    Subjective: C/o neck pain and rt knee pain Says he cannot move the neck well He had Ct cervical spine on 7/20  Had a good bowel movt today semi formed No nausea or vomiting  Medications:   apixaban  5 mg Oral BID   ascorbic acid  500 mg Oral Daily   benzonatate  100 mg Oral TID   budesonide (PULMICORT) nebulizer solution  0.5 mg Nebulization BID   carbidopa-levodopa  1 tablet Oral TID   Chlorhexidine Gluconate Cloth  6 each Topical Daily   diclofenac Sodium  4 g Topical QID   escitalopram  5 mg Oral Daily   feeding supplement  237 mL Oral BID BM   finasteride  5 mg Oral Daily   fluticasone  1 spray Each Nare Daily   furosemide  20 mg Oral Daily   gabapentin  800 mg Oral BID   guaiFENesin  600 mg Oral Q12H   insulin aspart  0-5 Units Subcutaneous QHS   insulin aspart  0-9 Units Subcutaneous TID WC   ipratropium  1 spray Each Nare BID   ipratropium-albuterol  3 mL Nebulization TID   lactase  3,000 Units Oral TID WC   lactulose  30 g Oral Daily   loratadine  10 mg Oral Daily   metoprolol succinate  50 mg Oral Daily   montelukast  10 mg Oral QHS   polyethylene glycol  17 g Oral BID   senna-docusate  2 tablet Oral QHS   sodium chloride flush  10-40 mL Intracatheter Q12H   tamsulosin  0.4 mg Oral Daily   traZODone  100 mg Oral QHS    Objective: Vital signs in last 24 hours: Temp:  [97.8 F (36.6 C)-98.2 F (36.8 C)] 97.9 F (36.6 C) (07/27 0847) Pulse Rate:  [54-99] 99 (07/27 0847) Resp:  [16-20] 16 (07/27 0847) BP: (104-140)/(55-92) 104/74 (07/27 0847) SpO2:  [93  %-98 %] 93 % (07/27 0847)  PHYSICAL EXAM:  General: Alert, cooperative, some discomfort,  Lungs: b/l air entry- few rhonchi Heart: irregular ,rate controlled Abdomen: Soft, some distension Supra pubic catheter Extremities: edema arms Skin: bruises arms Lymph: Cervical, supraclavicular normal. Neurologic: Grossly non-focal  Lab Results Recent Labs    10/23/20 0642  NA 137  K 4.5  CL 103  CO2 28  BUN 26*  CREATININE 0.75   Liver Panel No results for input(s): PROT, ALBUMIN, AST, ALT, ALKPHOS, BILITOT, BILIDIR, IBILI in the last 72 hours. Sedimentation Rate No results for input(s): ESRSEDRATE in the last 72 hours. C-Reactive Protein No results for input(s): CRP in the last 72 hours.  Microbiology:  Studies/Results: CT ABDOMEN PELVIS WO CONTRAST  Result Date: 10/23/2020 CLINICAL DATA:  75 year old male with concern for bowel obstruction. EXAM: CT ABDOMEN AND PELVIS WITHOUT CONTRAST TECHNIQUE: Multidetector CT imaging of the abdomen and pelvis was performed following the standard protocol without IV contrast. COMPARISON:  Abdominal radiograph dated 10/23/2020. FINDINGS: Evaluation of this exam is limited in the absence of intravenous contrast. Lower chest:  Trace bilateral pleural effusions with minimal bibasilar dependent atelectasis. There is coronary vascular calcification. No intra-abdominal free air or free fluid. Hepatobiliary: There is a 2 cm right liver hypodense lesion which is suboptimally characterized on this CT but demonstrates fluid attenuation most consistent with a cyst. No intrahepatic biliary ductal dilatation. Small gallstone. No pericholecystic fluid or evidence of acute cholecystitis by CT. Pancreas: Unremarkable. No pancreatic ductal dilatation or surrounding inflammatory changes. Spleen: Normal in size without focal abnormality. Adrenals/Urinary Tract: The adrenal glands unremarkable. Mild bilateral renal parenchyma atrophy. There is a punctate nonobstructing left  renal inferior pole calculus. No hydronephrosis. There is no hydronephrosis or nephrolithiasis on the right. A 16 mm exophytic hypodense lesion from the posterior interpolar left kidney demonstrates fluid attenuation most consistent with cysts. Several additional smaller hypodense lesions are not characterized on this noncontrast CT. The visualized ureters appear unremarkable. The urinary bladder is decompressed around a suprapubic catheter. Small amount of air within the bladder likely introduced via the catheter. Stomach/Bowel: There is sigmoid diverticulosis without active inflammatory changes. There is moderate amount of stool throughout the colon. There is no bowel obstruction or active inflammation. Appendectomy. Vascular/Lymphatic: Mild aortoiliac atherosclerotic disease. The IVC is unremarkable. No portal venous gas. There is no adenopathy. Reproductive: The prostate gland is poorly visualized but grossly unremarkable. Other: Mild diffuse subcutaneous edema of the pelvis. No fluid collection. Musculoskeletal: Total bilateral hip arthroplasties. There is no acute fracture or dislocation. There is advanced osteopenia. Extensive degenerative changes of the spine. IMPRESSION: 1. No acute intra-abdominal or pelvic pathology. No bowel obstruction. 2. Sigmoid diverticulosis. 3. Cholelithiasis. 4. Aortic Atherosclerosis (ICD10-I70.0). Electronically Signed   By: Elgie Collard M.D.   On: 10/23/2020 21:52   [place] EKG SITE RITE  Result Date: 10/24/2020 If Site Rite image not attached, placement could not be confirmed due to current cardiac rhythm.  CT neck  No acute osseous abnormality identified in the cervical spine.2. Extensive postsurgical changes from C3-C6 anterior and C3-C7 posterior fusion. 3. Advanced disc and facet degeneration in the cervical and upper thoracic spine with advanced multilevel neural foraminal stenosis  Assessment/Plan: Streptococcus Group C bacteremia Unclear source TEE  neg Cervical hardware- First CT done on 7/20 no obvious infection He continues to have pain- May have to repeat imaging On ceftriaxone- can change to cefazolin when in hospital and on discharge switch back to ceftriaxone for once a day convenience until 11/13/20. 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

## 2020-10-25 NOTE — Progress Notes (Signed)
PROGRESS NOTE    Raymond Hays  JGG:[id number] DOB: [birth year] DOA: 10/16/2020 PCP: [facility]   Brief Narrative: Taken from prior notes. Raymond Hays is a 75 y.o. Caucasian male with medical history significant for type 2 diabetes mellitus, dyslipidemia, hypertension, COPD, and anxiety and allergic rhinitis, who presented to the ER with acute onset of generalized weakness over the last 4 days at her SNF. Admitted with sepsis secondary to group C streptococcus bacteremia.  Started on Rocephin and will need 4 weeks of antibiotics till 11/13/2020.  TTE is without any concern of endocarditis.  Repeat blood cultures on 05/22/2020 remains negative. Patient had neck, right knee and right elbow pain.  Orthopedic surgery did not believe the right knee or elbow was septic.  CT cervical spine did not show any osteomyelitis but does have old hardware there.  The patient developed ileus during the hospital course.  Patient finally had a bowel movement on 10/24/2020 and diet advanced to solid food.  PICC line placed.    Later some nursing concern about hematochezia-FOBT positive.  Subjective: Patient continued to complain about right knee and neck pain.  Having multiple bowel movements.  No abdominal pain. Later had some nursing concern about hematochezia.  Assessment & Plan:   Active Problems:   AF (paroxysmal atrial fibrillation) (HCC)   Acute pain of right knee   COPD with acute exacerbation (HCC)   Sepsis (HCC)   Neck pain   AKI (acute kidney injury) (HCC)   Type 2 diabetes mellitus with diabetic neuropathy, without long-term current use of insulin (HCC)   Parkinson disease (HCC)   Ileus (HCC)  Sepsis secondary to group C streptococcal bacteremia.  Remained afebrile.  Continue to have neck pain.  Initial CT cervical spine done on 10/19/2020 was negative for any infection.  Patient do have hardware.  Repeat blood cultures remain negative. -ID was consulted-appreciate their recommendations. -Will  need IV antibiotics till 11/13/2020.  Neck pain.  Initial cervical spine imaging was negative for any osteomyelitis.  Patient do have hardware. -Schedule Robaxin -Try lidocaine patch -If pain persist or get worse we will reimage his neck.  Ileus.  Resolved having multiple bowel movements. -Back of from bowel regimen. -Continue to monitor  Concern of hematochezia.  FOBT positive. -Monitor hemoglobin-if dropping we will consult GI.  COPD exacerbation.  Resolved.  Completed a course of steroid. -Continue with breathing treatment and supportive care  Chronic suprapubic catheter.  Changed by urology on 10/23/2020.  Concern of urinary leakage through penis. -Continue changing monthly. -Outpatient urology follow-up  Right knee pain and right elbow pain.  No signs of septic joints as per orthopedic surgery.  Diclofenac gel ordered for right knee.  AKI.  Resolved  Parkinson's disease. -Continue Sinemet.  Paroxysmal atrial fibrillation. -Continue with Eliquis and metoprolol  Type 2 diabetes mellitus with neuropathy.  CBG improved after stopping steroid.  A1c of 6.6. -Continue with SSI -Continue with gabapentin  Essential hypertension.  Blood pressure within goal. -Continue with metoprolol  Generalized weakness. -PT is recommending SNF placement-should be able to go tomorrow. -COVID test ordered  Objective: Vitals:   10/25/20 0022 10/25/20 0351 10/25/20 0847 10/25/20 1541  BP: (!) 114/55 118/67 104/74 (!) 130/54  Pulse: 71 (!) 54 99 86  Resp: 20 20 16 20   Temp: 97.8 F (36.6 C) 98.2 F (36.8 C) 97.9 F (36.6 C) 98.4 F (36.9 C)  TempSrc:      SpO2: 94% 97% 93% (!) 82%  Weight:  Intake/Output Summary (Last 24 hours) at 10/25/2020 1638 Last data filed at 10/25/2020 1158 Gross per 24 hour  Intake 300 ml  Output 2450 ml  Net -2150 ml   Filed Weights   10/16/20 1534  Weight: 103 kg    Examination:  General exam: Appears calm and comfortable  Respiratory  system: Clear to auscultation. Respiratory effort normal. Cardiovascular system: S1 & S2 heard, RRR.  Gastrointestinal system: Soft, nontender, nondistended, bowel sounds positive. Central nervous system: Alert and oriented. No focal neurological deficits. Extremities: 1+ LE edema, no cyanosis, pulses intact and symmetrical. Psychiatry: Judgement and insight appear normal.   DVT prophylaxis: Eliquis Code Status: Full Family Communication: Discussed with patient. Disposition Plan:  Status is: Inpatient  Remains inpatient appropriate because:Inpatient level of care appropriate due to severity of illness  Dispo: The patient is from: SNF              Anticipated d/c is to: SNF              Patient currently is medically stable to d/c.   Difficult to place patient No               Level of care: Med-Surg  All the records are reviewed and case discussed with Care Management/Social Worker. Management plans discussed with the patient, nursing and they are in agreement.  Consultants:  Infectious disease  Procedures:  Antimicrobials:  Ceftriaxone  Data Reviewed: I have personally reviewed following labs and imaging studies  CBC: Recent Labs  Lab 10/19/20 0112 10/21/20 0545  WBC 13.0* 9.9  HGB 10.0* 11.4*  HCT 30.9* 35.2*  MCV 88.3 85.9  PLT 217 311   Basic Metabolic Panel: Recent Labs  Lab 10/19/20 0112 10/21/20 0545 10/22/20 0527 10/23/20 0642  NA 135 136 138 137  K 5.1 4.9 4.4 4.5  CL 104 106 105 103  CO2 GLUCOSE 208* 228* 131* 140*  BUN 54* 46* 35* 26*  CREATININE 1.19 0.96 0.85 0.75  CALCIUM 8.7* 8.6* 8.4* 8.5*   GFR: CrCl cannot be calculated (Unknown ideal weight.). Liver Function Tests: No results for input(s): AST, ALT, ALKPHOS, BILITOT, PROT, ALBUMIN in the last 168 hours. No results for input(s): LIPASE, AMYLASE in the last 168 hours. No results for input(s): AMMONIA in the last 168 hours. Coagulation Profile: No results for input(s):  INR, PROTIME in the last 168 hours. Cardiac Enzymes: No results for input(s): CKTOTAL, CKMB, CKMBINDEX, TROPONINI in the last 168 hours. BNP (last 3 results) No results for input(s): PROBNP in the last 8760 hours. HbA1C: Recent Labs    10/23/20 0642  HGBA1C 6.6*   CBG: Recent Labs  Lab 10/24/20 1242 10/24/20 1642 10/24/20 2113 10/25/20 0840 10/25/20 1214  GLUCAP 195* 236* 153* 148* 185*   Lipid Profile: No results for input(s): CHOL, HDL, LDLCALC, TRIG, CHOLHDL, LDLDIRECT in the last 72 hours. Thyroid Function Tests: No results for input(s): TSH, T4TOTAL, FREET4, T3FREE, THYROIDAB in the last 72 hours. Anemia Panel: No results for input(s): VITAMINB12, FOLATE, FERRITIN, TIBC, IRON, RETICCTPCT in the last 72 hours. Sepsis Labs: No results for input(s): PROCALCITON, LATICACIDVEN in the last 168 hours.  Recent Results (from the past 240 hour(s))  Resp Panel by RT-PCR (Flu A&B, Covid) Nasopharyngeal Swab     Status: None   Collection Time: 10/16/20  3:36 PM   Specimen: Nasopharyngeal Swab; Nasopharyngeal(NP) swabs in vial transport medium  Result Value Ref Range Status   SARS Coronavirus 2 by RT  PCR NEGATIVE NEGATIVE Final    Comment: (NOTE) SARS-CoV-2 target nucleic acids are NOT DETECTED.  The SARS-CoV-2 RNA is generally detectable in upper respiratory specimens during the acute phase of infection. The lowest concentration of SARS-CoV-2 viral copies this assay can detect is 138 copies/mL. A negative result does not preclude SARS-Cov-2 infection and should not be used as the sole basis for treatment or other patient management decisions. A negative result may occur with  improper specimen collection/handling, submission of specimen other than nasopharyngeal swab, presence of viral mutation(s) within the areas targeted by this assay, and inadequate number of viral copies(<138 copies/mL). A negative result must be combined with clinical observations, patient history, and  epidemiological information. The expected result is Negative.  Fact Sheet for Patients:  BloggerCourse.com  Fact Sheet for Healthcare Providers:  SeriousBroker.it  This test is no t yet approved or cleared by the [place] FDA and  has been authorized for detection and/or diagnosis of SARS-CoV-2 by FDA under an Emergency Use Authorization (EUA). This EUA will remain  in effect (meaning this test can be used) for the duration of the COVID-19 declaration under Section 564(b)(1) of the Act, 21 U.S.C.section 360bbb-3(b)(1), unless the authorization is terminated  or revoked sooner.       Influenza A by PCR NEGATIVE NEGATIVE Final   Influenza B by PCR NEGATIVE NEGATIVE Final    Comment: (NOTE) The Xpert Xpress SARS-CoV-2/FLU/RSV plus assay is intended as an aid in the diagnosis of influenza from Nasopharyngeal swab specimens and should not be used as a sole basis for treatment. Nasal washings and aspirates are unacceptable for Xpert Xpress SARS-CoV-2/FLU/RSV testing.  Fact Sheet for Patients: BloggerCourse.com  Fact Sheet for Healthcare Providers: SeriousBroker.it  This test is not yet approved or cleared by the [place] FDA and has been authorized for detection and/or diagnosis of SARS-CoV-2 by FDA under an Emergency Use Authorization (EUA). This EUA will remain in effect (meaning this test can be used) for the duration of the COVID-19 declaration under Section 564(b)(1) of the Act, 21 U.S.C. section 360bbb-3(b)(1), unless the authorization is terminated or revoked.  Performed at [facility], [address] [place], [place] [address]   Blood culture (routine x 2)     Status: Abnormal   Collection Time: 10/16/20  7:26 PM   Specimen: BLOOD LEFT FOREARM  Result Value Ref Range Status   Specimen Description   Final    BLOOD LEFT FOREARM Performed at  [facility], [address] [place], [place] [address]    Special Requests   Final    BOTTLES DRAWN AEROBIC AND ANAEROBIC Blood Culture adequate volume Performed at [facility], [address], [place], [place] [address]    Culture  Setup Time   Final    GRAM POSITIVE COCCI IN BOTH AEROBIC AND ANAEROBIC BOTTLES CRITICAL RESULT CALLED TO, READ BACK BY AND VERIFIED WITH: KRISTEN MERRIL, PHARMD AT 0830 ON 10/17/20 BY GM Performed at [facility], [address] [place], [place] [address]    Culture STREPTOCOCCUS GROUP C (A)  Final   Report Status 10/19/2020 FINAL  Final   Organism ID, Bacteria STREPTOCOCCUS GROUP C  Final      Susceptibility   Streptococcus group c - MIC*    CLINDAMYCIN <=0.25 SENSITIVE Sensitive     AMPICILLIN <=0.25 SENSITIVE Sensitive     ERYTHROMYCIN <=0.12 SENSITIVE Sensitive     VANCOMYCIN 0.5 SENSITIVE Sensitive     CEFTRIAXONE <=0.12  LEVOFLOXACIN 1 SENSITIVE Sensitive     PENICILLIN Value in next row Sensitive      SENSITIVE0.06    * STREPTOCOCCUS GROUP C  Blood Culture ID Panel (Reflexed)     Status: Abnormal   Collection Time: 10/16/20  7:26 PM  Result Value Ref Range Status   Enterococcus faecalis NOT DETECTED NOT DETECTED Final   Enterococcus Faecium NOT DETECTED NOT DETECTED Final   Listeria monocytogenes NOT DETECTED NOT DETECTED Final   Staphylococcus species NOT DETECTED NOT DETECTED Final   Staphylococcus aureus (BCID) NOT DETECTED NOT DETECTED Final   Staphylococcus epidermidis NOT DETECTED NOT DETECTED Final   Staphylococcus lugdunensis NOT DETECTED NOT DETECTED Final   Streptococcus species DETECTED (A) NOT DETECTED Final    Comment: Not Enterococcus species, Streptococcus agalactiae, Streptococcus pyogenes, or Streptococcus pneumoniae. CRITICAL RESULT CALLED TO, READ BACK BY AND VERIFIED WITH: KRISTEN MERRIL, PHARMD AT 0830 ON 10/17/20 BY GM    Streptococcus agalactiae NOT DETECTED NOT DETECTED Final    Streptococcus pneumoniae NOT DETECTED NOT DETECTED Final   Streptococcus pyogenes NOT DETECTED NOT DETECTED Final   A.calcoaceticus-baumannii NOT DETECTED NOT DETECTED Final   Bacteroides fragilis NOT DETECTED NOT DETECTED Final   Enterobacterales NOT DETECTED NOT DETECTED Final   Enterobacter cloacae complex NOT DETECTED NOT DETECTED Final   Escherichia coli NOT DETECTED NOT DETECTED Final   Klebsiella aerogenes NOT DETECTED NOT DETECTED Final   Klebsiella oxytoca NOT DETECTED NOT DETECTED Final   Klebsiella pneumoniae NOT DETECTED NOT DETECTED Final   Proteus species NOT DETECTED NOT DETECTED Final   Salmonella species NOT DETECTED NOT DETECTED Final   Serratia marcescens NOT DETECTED NOT DETECTED Final   Haemophilus influenzae NOT DETECTED NOT DETECTED Final   Neisseria meningitidis NOT DETECTED NOT DETECTED Final   Pseudomonas aeruginosa NOT DETECTED NOT DETECTED Final   Stenotrophomonas maltophilia NOT DETECTED NOT DETECTED Final   Candida albicans NOT DETECTED NOT DETECTED Final   Candida auris NOT DETECTED NOT DETECTED Final   Candida glabrata NOT DETECTED NOT DETECTED Final   Candida krusei NOT DETECTED NOT DETECTED Final   Candida parapsilosis NOT DETECTED NOT DETECTED Final   Candida tropicalis NOT DETECTED NOT DETECTED Final   Cryptococcus neoformans/gattii NOT DETECTED NOT DETECTED Final    Comment: Performed at [facility], [address] [place], [place] [address]  Blood culture (routine x 2)     Status: None   Collection Time: 10/16/20 11:23 PM   Specimen: BLOOD RIGHT FOREARM  Result Value Ref Range Status   Specimen Description BLOOD RIGHT FOREARM  Final   Special Requests   Final    BOTTLES DRAWN AEROBIC AND ANAEROBIC Blood Culture adequate volume   Culture   Final    NO GROWTH 5 DAYS Performed at [facility], [address] [place], [place] [address]    Report Status 10/21/2020 FINAL  Final  CULTURE, BLOOD (ROUTINE X 2) w Reflex to ID  Panel     Status: None   Collection Time: 10/19/20  1:11 AM   Specimen: BLOOD RIGHT FOREARM  Result Value Ref Range Status   Specimen Description BLOOD RIGHT FOREARM  Final   Special Requests   Final    BOTTLES DRAWN AEROBIC AND ANAEROBIC Blood Culture results may not be optimal due to an excessive volume of blood received in culture bottles   Culture   Final    NO GROWTH 5 DAYS Performed at [facility], [address] [place], [place] [address]  Report Status 10/24/2020 FINAL  Final  CULTURE, BLOOD (ROUTINE X 2) w Reflex to ID Panel     Status: None   Collection Time: 10/19/20  1:17 AM   Specimen: BLOOD RIGHT HAND  Result Value Ref Range Status   Specimen Description BLOOD RIGHT HAND  Final   Special Requests   Final    BOTTLES DRAWN AEROBIC AND ANAEROBIC Blood Culture adequate volume   Culture   Final    NO GROWTH 5 DAYS Performed at [facility], [address] [place], [place] [address]    Report Status 10/24/2020 FINAL  Final     Radiology Studies: CT ABDOMEN PELVIS WO CONTRAST  Result Date: 10/23/2020 CLINICAL DATA:  75 year old male with concern for bowel obstruction. EXAM: CT ABDOMEN AND PELVIS WITHOUT CONTRAST TECHNIQUE: Multidetector CT imaging of the abdomen and pelvis was performed following the standard protocol without IV contrast. COMPARISON:  Abdominal radiograph dated 10/23/2020. FINDINGS: Evaluation of this exam is limited in the absence of intravenous contrast. Lower chest: Trace bilateral pleural effusions with minimal bibasilar dependent atelectasis. There is coronary vascular calcification. No intra-abdominal free air or free fluid. Hepatobiliary: There is a 2 cm right liver hypodense lesion which is suboptimally characterized on this CT but demonstrates fluid attenuation most consistent with a cyst. No intrahepatic biliary ductal dilatation. Small gallstone. No pericholecystic fluid or evidence of acute cholecystitis by CT. Pancreas:  Unremarkable. No pancreatic ductal dilatation or surrounding inflammatory changes. Spleen: Normal in size without focal abnormality. Adrenals/Urinary Tract: The adrenal glands unremarkable. Mild bilateral renal parenchyma atrophy. There is a punctate nonobstructing left renal inferior pole calculus. No hydronephrosis. There is no hydronephrosis or nephrolithiasis on the right. A 16 mm exophytic hypodense lesion from the posterior interpolar left kidney demonstrates fluid attenuation most consistent with cysts. Several additional smaller hypodense lesions are not characterized on this noncontrast CT. The visualized ureters appear unremarkable. The urinary bladder is decompressed around a suprapubic catheter. Small amount of air within the bladder likely introduced via the catheter. Stomach/Bowel: There is sigmoid diverticulosis without active inflammatory changes. There is moderate amount of stool throughout the colon. There is no bowel obstruction or active inflammation. Appendectomy. Vascular/Lymphatic: Mild aortoiliac atherosclerotic disease. The IVC is unremarkable. No portal venous gas. There is no adenopathy. Reproductive: The prostate gland is poorly visualized but grossly unremarkable. Other: Mild diffuse subcutaneous edema of the pelvis. No fluid collection. Musculoskeletal: Total bilateral hip arthroplasties. There is no acute fracture or dislocation. There is advanced osteopenia. Extensive degenerative changes of the spine. IMPRESSION: 1. No acute intra-abdominal or pelvic pathology. No bowel obstruction. 2. Sigmoid diverticulosis. 3. Cholelithiasis. 4. Aortic Atherosclerosis (ICD10-I70.0). Electronically Signed   By: Elgie Collard M.D.   On: 10/23/2020 21:52   [place] EKG SITE RITE  Result Date: 10/24/2020 If Site Rite image not attached, placement could not be confirmed due to current cardiac rhythm.   Scheduled Meds:  apixaban  5 mg Oral BID   ascorbic acid  500 mg Oral Daily   benzonatate  100  mg Oral TID   budesonide (PULMICORT) nebulizer solution  0.5 mg Nebulization BID   carbidopa-levodopa  1 tablet Oral TID   Chlorhexidine Gluconate Cloth  6 each Topical Daily   diclofenac Sodium  4 g Topical QID   escitalopram  5 mg Oral Daily   feeding supplement  237 mL Oral BID BM   finasteride  5 mg Oral Daily   fluticasone  1 spray Each Nare Daily   furosemide  20 mg Oral Daily   gabapentin  800 mg Oral BID   guaiFENesin  600 mg Oral Q12H   insulin aspart  0-5 Units Subcutaneous QHS   insulin aspart  0-9 Units Subcutaneous TID WC   ipratropium  1 spray Each Nare BID   ipratropium-albuterol  3 mL Nebulization TID   lactase  3,000 Units Oral TID WC   lactulose  30 g Oral Daily   lidocaine  1 patch Transdermal Q24H   loratadine  10 mg Oral Daily   methocarbamol  500 mg Oral TID   metoprolol succinate  50 mg Oral Daily   montelukast  10 mg Oral QHS   sodium chloride flush  10-40 mL Intracatheter Q12H   tamsulosin  0.4 mg Oral Daily   traZODone  100 mg Oral QHS   Continuous Infusions:   ceFAZolin (ANCEF) IV       LOS: 9 days   Time spent: 40 minutes. More than 50% of the time was spent in counseling/coordination of care  Arnetha Courser, MD [facility]  If 7PM-7AM, please contact night-coverage [email or web address]  10/25/2020, 4:38 PM   This record has been created using Conservation officer, historic buildings. Errors have been sought and corrected,but may not always be located. Such creation errors do not reflect on the standard of care.

## 2020-10-26 ENCOUNTER — Encounter: Payer: Self-pay | Admitting: Family Medicine

## 2020-10-26 ENCOUNTER — Inpatient Hospital Stay: Payer: No Typology Code available for payment source

## 2020-10-26 DIAGNOSIS — B955 Unspecified streptococcus as the cause of diseases classified elsewhere: Secondary | ICD-10-CM | POA: Diagnosis not present

## 2020-10-26 DIAGNOSIS — K921 Melena: Secondary | ICD-10-CM

## 2020-10-26 DIAGNOSIS — R7881 Bacteremia: Secondary | ICD-10-CM | POA: Diagnosis not present

## 2020-10-26 LAB — BASIC METABOLIC PANEL
Anion gap: 7 (ref 5–15)
BUN: 24 mg/dL — ABNORMAL HIGH (ref 8–23)
CO2: 30 mmol/L (ref 22–32)
Calcium: 8.3 mg/dL — ABNORMAL LOW (ref 8.9–10.3)
Chloride: 97 mmol/L — ABNORMAL LOW (ref 98–111)
Creatinine, Ser: 0.86 mg/dL (ref 0.61–1.24)
GFR, Estimated: >60 mL/min (ref >60)
Glucose, Bld: 146 mg/dL — ABNORMAL HIGH (ref 70–99)
Potassium: 4.4 mmol/L (ref 3.5–5.1)
Sodium: 134 mmol/L — ABNORMAL LOW (ref 135–145)

## 2020-10-26 LAB — CBC
HCT: 27.6 % — ABNORMAL LOW (ref 39.0–52.0)
Hemoglobin: 9.2 g/dL — ABNORMAL LOW (ref 13.0–17.0)
MCH: 29.1 pg (ref 26.0–34.0)
MCHC: 33.3 g/dL (ref 30.0–36.0)
MCV: 87.3 fL (ref 80.0–100.0)
Platelets: 367 10*3/uL (ref 150–400)
RBC: 3.16 MIL/uL — ABNORMAL LOW (ref 4.22–5.81)
RDW: 15.8 % — ABNORMAL HIGH (ref 11.5–15.5)
WBC: 13 10*3/uL — ABNORMAL HIGH (ref 4.0–10.5)
nRBC: 0 % (ref 0.0–0.2)

## 2020-10-26 LAB — GLUCOSE, CAPILLARY
Glucose-Capillary: 192 mg/dL — ABNORMAL HIGH (ref 70–99)
Glucose-Capillary: 184 mg/dL — ABNORMAL HIGH (ref 70–99)
Glucose-Capillary: 152 mg/dL — ABNORMAL HIGH (ref 70–99)
Glucose-Capillary: 132 mg/dL — ABNORMAL HIGH (ref 70–99)

## 2020-10-26 LAB — PROTIME-INR
INR: 1.4 — ABNORMAL HIGH (ref 0.8–1.2)
Prothrombin Time: 17.4 seconds — ABNORMAL HIGH (ref 11.4–15.2)

## 2020-10-26 MED ORDER — GADOBUTROL 1 MMOL/ML IV SOLN
10.0000 mL | Freq: Once | INTRAVENOUS | Status: AC | PRN
  Administered 2020-10-26: 10 mL via INTRAVENOUS

## 2020-10-26 MED ORDER — IOHEXOL 350 MG/ML SOLN
75.0000 mL | Freq: Once | INTRAVENOUS | Status: AC | PRN
  Administered 2020-10-26: 75 mL via INTRAVENOUS

## 2020-10-26 MED ORDER — LACTATED RINGERS IV SOLN: INTRAVENOUS | Status: AC

## 2020-10-26 MED ORDER — SODIUM CHLORIDE 0.9 % IV SOLN: INTRAVENOUS | Status: DC

## 2020-10-26 MED ORDER — PEG 3350-KCL-NA BICARB-NACL 420 G PO SOLR
4000.0000 mL | Freq: Once | ORAL | Status: AC
  Administered 2020-10-26: 4000 mL via ORAL
  Filled 2020-10-26: qty 4000

## 2020-10-26 NOTE — Consult Note (Signed)
Raymond Hays , MD [address], [address], [place], [place], [address] [address], [place], [place], [address] Phone: [phone]  Fax: [phone]  Consultation  Referring Provider:     Dr Nelson Chimes Primary Care Physician:  [facility] Primary Gastroenterologist:  Dr. Allegra Lai         Reason for Consultation:     melena   Date of Admission:  10/16/2020 Date of Consultation:  10/26/2020         HPI:   Raymond Hays is a 75 y.o. male Was admitted on 10/16/2020 with weakness and was found to have group the Streptococcus bacteremia on antibiotics.  History of COPD, diabetes, hyperlipidemia.  He has a history of paroxysmal AF and on Eliquis.  Last dose taken was last night.  I have been called to see him as he had a red bowel movement in the middle of the night.  Hemoglobin was around 10 g previously but now has dropped between 1 to 2 g.  10/23/2020: CT abdomen and pelvis without contrast shows no acute abnormalities.He was seen by Dr. Allegra Lai in December 2021 for rectal bleeding.  Was thought to have hemorrhoids at that time.  It was felt that he had rectal bleeding from external hemorrhoids.  Patient does not have a good eye sight and is not sure of the color of his bowel movements , the nurse mentioned that he had a fewlarge bowel movements yesterday and the last one was maroon in color. Denies any abdominal pain . No bowel movement today  He is recovering from an ileus.    Past Medical History:  Diagnosis Date  . Allergic rhinitis   . Anxiety   . Arthritis   . Chronic indwelling Foley catheter   . COPD (chronic obstructive pulmonary disease) (HCC)   . Cough   . Diabetes (HCC)   . Essential (primary) hypertension   . Hemorrhoid   . Hyperlipidemia   . PAF (paroxysmal atrial fibrillation) (HCC)   . Primary osteoarthritis, unspecified shoulder   . Retention of urine, unspecified   . Spinal stenosis   . UTI (urinary tract infection)     Past Surgical History:   Procedure Laterality Date  . APPENDECTOMY    . BACK SURGERY    . ELBOW SURGERY    . HERNIA REPAIR    . KNEE SURGERY    . neck    . PARTIAL HIP ARTHROPLASTY Bilateral   . TEE WITHOUT CARDIOVERSION N/A 10/19/2020   Procedure: TRANSESOPHAGEAL ECHOCARDIOGRAM (TEE);  Surgeon: Antonieta Iba, MD;  Location: [facility] ORS;  Service: Cardiovascular;  Laterality: N/A;  . TONSILLECTOMY      Prior to Admission medications   Medication Sig Start Date End Date Taking? Authorizing Provider  acetaminophen (TYLENOL) 325 MG tablet Take 650 mg by mouth every 4 (four) hours as needed.   Yes [provider]  ANTI-DANDRUFF 1 % SHAM Apply topically. 02/18/20  Yes [provider]  apixaban (ELIQUIS) 5 MG TABS tablet Take 5 mg by mouth 2 (two) times daily.  02/06/17  Yes [provider]  atorvastatin (LIPITOR) 20 MG tablet Take 20 mg by mouth daily.   Yes [provider]  cetirizine (ZYRTEC) 10 MG tablet Take 10 mg by mouth daily.    Yes [provider]  diclofenac Sodium (VOLTAREN) 1 % GEL Apply 4 g topically 4 (four) times daily.   Yes [provider]  escitalopram (LEXAPRO) 5 MG tablet Take 5 mg  by mouth daily.   Yes [provider]  finasteride (PROSCAR) 5 MG tablet Take 5 mg by mouth daily.   Yes [provider]  gabapentin (NEURONTIN) 800 MG tablet Take 800 mg by mouth 2 (two) times daily.    Yes [provider]  guaiFENesin (MUCINEX) 600 MG 12 hr tablet Take 600 mg by mouth every 12 (twelve) hours.   Yes [provider]  ipratropium (ATROVENT) 0.03 % nasal spray Place 2 sprays into both nostrils 2 (two) times daily. 02/18/20  Yes [provider]  lactase (LACTAID) 3000 units tablet Take 3,000 Units by mouth 3 (three) times daily with meals.   Yes [provider]  methocarbamol (ROBAXIN) 500 MG tablet Take 1,000 mg by mouth in the morning and at bedtime.    Yes [provider]  metoprolol  succinate (TOPROL-XL) 50 MG 24 hr tablet Take 50 mg by mouth daily.    Yes [provider]  montelukast (SINGULAIR) 10 MG tablet Take 10 mg by mouth at bedtime.   Yes [provider]  nitrofurantoin, macrocrystal-monohydrate, (MACROBID) 100 MG capsule Take 100 mg by mouth at bedtime.   Yes [provider]  polyethylene glycol (MIRALAX / GLYCOLAX) 17 g packet Take 17 g by mouth daily.   Yes [provider]  predniSONE (DELTASONE) 20 MG tablet Take 20 mg by mouth daily with breakfast.   Yes [provider]  simethicone (MYLICON) 125 MG chewable tablet Chew 125 mg by mouth 3 (three) times daily.   Yes [provider]  Skin Protectants, Misc. (MINERIN CREME EX) Apply topically. Apply to feet twice bid   Yes [provider]  tamsulosin (FLOMAX) 0.4 MG CAPS capsule Take 0.4 mg by mouth 2 (two) times daily.   Yes [provider]  torsemide (DEMADEX) 10 MG tablet Take 10 mg by mouth daily.   Yes [provider]  traMADol (ULTRAM) 50 MG tablet Take 100 mg by mouth in the morning.    Yes [provider]  traZODone (DESYREL) 100 MG tablet Take 100 mg by mouth at bedtime.   Yes [provider]  TRELEGY ELLIPTA 100-62.5-25 MCG/INH AEPB Inhale 1 puff into the lungs daily. 02/17/20  Yes [provider]  triamcinolone cream (KENALOG) 0.1 % Apply 1 application topically 2 (two) times daily.   Yes [provider]  Albuterol Sulfate (PROAIR RESPICLICK) 108 (90 Base) MCG/ACT AEPB Inhale 2 puffs into the lungs every 4 (four) hours as needed (COPD).    [provider]  ascorbic acid (VITAMIN C) 500 MG tablet Take by mouth.  Patient not taking: No sig reported    [provider]  benzonatate (TESSALON) 100 MG capsule Take by mouth 2 (two) times daily as needed for cough. Patient not taking: No sig reported    [provider]  bisacodyl (DULCOLAX) 10 MG suppository Place 10 mg  rectally as needed for moderate constipation.    [provider]  Budeson-Glycopyrrol-Formoterol (BREZTRI AEROSPHERE) 160-9-4.8 MCG/ACT AERO Inhale 2 puffs into the lungs in the morning and at bedtime. Patient not taking: No sig reported    [provider]  calcium carbonate (TUMS - DOSED IN MG ELEMENTAL CALCIUM) 500 MG chewable tablet Chew 2 tablets by mouth every 6 (six) hours as needed for indigestion or heartburn.    [provider]  carbidopa-levodopa (SINEMET IR) 25-100 MG tablet Take 1 tablet by mouth 3 (three) times daily before meals. At 0630, 1130, 1630  [provider]  carboxymethylcellulose (REFRESH PLUS) 0.5 % SOLN 1 drop as needed.    [provider]  clobetasol cream (TEMOVATE) 0.05 % Apply topically. 01/12/20   [provider]  diphenhydrAMINE-zinc acetate (BENADRYL ITCH STOPPING) cream Apply topically every 4 (four) hours.    [provider]  Docusate Sodium (STOOL SOFTENER LAXATIVE PO) Take 1 tablet by mouth in the morning and at bedtime.    [provider]  Emollient (CETAPHIL) cream Apply 1 application topically as needed.    [provider]  Emollient (EUCERIN DAILY PROTECTION/SPF30) LOTN Apply topically as needed.    [provider]  fluticasone (FLONASE) 50 MCG/ACT nasal spray Place 1 spray into both nostrils daily.    [provider]  furosemide (LASIX) 20 MG tablet Take by mouth. Patient not taking: No sig reported 01/27/20   [provider]  hydrocortisone (ANUSOL-HC) 2.5 % rectal cream Place 1 application rectally 2 (two) times daily. 09/15/19   Toney Reil, MD  hydrocortisone cream 0.5 % Apply topically.    [provider]  hydrOXYzine (ATARAX/VISTARIL) 25 MG tablet Take 25 mg by mouth 2 (two) times daily as needed for itching. 02/17/20   [provider]  ibuprofen (ADVIL) 200 MG tablet Take 200 mg by mouth 2 (two) times daily as needed  (jaw pain).    [provider]  ipratropium (ATROVENT) 0.06 % nasal spray Place into the nose. Patient not taking: No sig reported    [provider]  ipratropium-albuterol (DUONEB) 0.5-2.5 (3) MG/3ML SOLN Inhale 3 mLs into the lungs daily. And every 6 hours as needed for shortness of breath or wheezing    [provider]  Lidocaine 3 % CREA Apply topically daily as needed.    [provider]  lisinopril (ZESTRIL) 2.5 MG tablet Take 2.5 mg by mouth daily. Patient not taking: No sig reported    [provider]  loperamide (IMODIUM) 2 MG capsule Take 4 mg by mouth as needed for diarrhea or loose stools.    [provider]  metFORMIN (GLUCOPHAGE-XR) 500 MG 24 hr tablet Take 500 mg by mouth daily.     [provider]  nystatin (MYCOSTATIN/NYSTOP) powder  10/21/18   [provider]  psyllium (METAMUCIL) 58.6 % powder Take 1 packet by mouth daily.    [provider]  SARNA lotion Apply topically. 02/25/20   [provider]  senna-docusate (SENOKOT-S) 8.6-50 MG tablet Take by mouth. 10/21/18   [provider]    Family History  Problem Relation Age of Onset  . Healthy Son   . Healthy Son      Social History   Tobacco Use  . Smoking status: Former    Packs/day: 0.50    Years: 10.00    Pack years: 5.00    Types: Cigarettes    Quit date: 2006    Years since quitting: 16.5  . Smokeless tobacco: Never  Vaping Use  . Vaping Use: Never used  Substance Use Topics  . Alcohol use: Not Currently  . Drug use: Not Currently    Types: "Crack" cocaine    Comment: quit "crack" 15 years ago--11/04/2019    Allergies as of 10/16/2020  . (No Known Allergies)    Review of Systems:    All systems reviewed and negative except where noted in HPI.   Physical Exam:  Vital signs in last 24 hours: Temp:  [98.3 F (36.8 C)-98.5 F (36.9 C)] 98.3 F (36.8  C) (07/28 0859) Pulse Rate:  [86-108] 108 (07/28  0859) Resp:  [16-20] 18 (07/28 0859) BP: (114-130)/(54-76) 114/70 (07/28 0859) SpO2:  [82 %-96 %] 95 % (07/28 0859) Last BM Date: 10/25/20 General:   Pleasant, cooperative in NAD Head:  Normocephalic and atraumatic. Eyes:   No icterus.   Conjunctiva pink. PERRLA. Ears:  Normal auditory acuity. Neck:  Supple; no masses or thyroidomegaly Lungs: Respirations even and unlabored. Lungs clear to auscultation bilaterally.   No wheezes, crackles, or rhonchi.  Heart:  Regular rate and rhythm;  Without murmur, clicks, rubs or gallops Abdomen:  Soft, mild distension , non tender. Normal bowel sounds. No appreciable masses or hepatomegaly.  No rebound or guarding.  Neurologic:  Alert and oriented x3;  grossly normal neurologically. Psych:  Alert and cooperative. Normal affect.  LAB RESULTS: Recent Labs    10/25/20 1703 10/26/20 0450  WBC 12.3* 13.0*  HGB 9.9* 9.2*  HCT 30.5* 27.6*  PLT 393 367   BMET Recent Labs    10/26/20 0450  NA 134*  K 4.4  CL 97*  CO2 30  GLUCOSE 146*  BUN 24*  CREATININE 0.86  CALCIUM 8.3*   LFT No results for input(s): PROT, ALBUMIN, AST, ALT, ALKPHOS, BILITOT, BILIDIR, IBILI in the last 72 hours. PT/INR Recent Labs    10/26/20 0900  LABPROT 17.4*  INR 1.4*    STUDIES: No results found.    Impression / Plan:   Raymond Hays is a 75 y.o. y/o male who has been admitted with sepsis, bacteremia on antibiotics.  I have been asked to comment with him today because he had a bloody bowel movement earlier this morning.  1 to 2 g drop in hemoglobin.  He had a history of possible external hemorrhoids.No recent colonoscopy. Recovering from an ileus  Plan 1.  Eliquis has been held, monitor CBC and transfuse if needed 2.  We will plan for EGD and flexible sigmoidoscopy tomorrow 3.  If has active bleeding in the interim consider tagged RBC scan 4. If procedures tomorrow are normal , will need a colonoscopy when abdominal distension has completeley resolved (  recovering from ileus)   I have discussed alternative options, risks & benefits,  which include, but are not limited to, bleeding, infection, perforation,respiratory complication & drug reaction.  The patient agrees with this plan & written consent will be obtained.    Thank you for involving me in the care of this patient.      LOS: 10 days   Raymond MoodKiran Luman Holway, MD  10/26/2020, 11:23 AM

## 2020-10-26 NOTE — TOC Progression Note (Signed)
Transition of Care [facility]) - Progression Note    Patient Details  Name: Raymond Hays MRN: [id number] Date of Birth: December 26, 1945  [facility]) CM/SW Contact  Barrie Dunker, RN Phone Number: 10/26/2020, 9:27 AM  Clinical Narrative:    Patient is to go to [facility] at DC, [facility] DM will continue to monitor for additional needs        Expected Discharge Plan and Services                                                 Social Determinants of Health (SDOH) Interventions    Readmission Risk Interventions Readmission Risk Prevention Plan 10/20/2020  Transportation Screening Complete  PCP or Specialist Appt within 3-5 Days Complete  HRI or Home Care Consult Complete  Palliative Care Screening Not Applicable  Medication Review (RN Care Manager) Referral to Pharmacy

## 2020-10-26 NOTE — Progress Notes (Addendum)
Had medium loose maroon color stool. MD aware of  Heme positive stool on 7/27. PM dose of eliquis was not given. Will monitor. CBC ths am.

## 2020-10-26 NOTE — Progress Notes (Signed)
Physical Therapy Treatment Patient Details Name: Raymond Hays MRN: [id number] DOB: 1946-03-14 Today's Date: 10/26/2020    History of Present Illness Raymond Hays is a 74yoM who comes to [facility] on 7/18 from [facility] with 4 days weakness, DOE. PMH: DM2, HTN, COPD, GAD, PND, PAF. Pt admitted c ARI, sepsis, AKI, dehydration. At baseline pt is able to perform supervised transfers bed to/from [facility], able to perform intermittent limited self propulsion in WC. Pt reportedly could AMB up to 82ft with therapy team using a RW.    PT Comments    Pt was supine in bed with HOB elevated 22 degrees upon arriving. RN in room issuing medications. Pt endorses severe pain 9/10 in neck/R knee. Max encouragement to participate and sit up EOB so RN could apply pain patch. Max assist to roll R to short sit with +2 assistance for safety. Pt yells out in pain throughout session with any movements. Sat EOB and performed several exercises prior to insisting he return to bed. Poor tolerance throughout session. Will need extensive PT to return to PLOF. Recommend pt return to his LTC facility with PT.    Follow Up Recommendations  SNF;Other (comment) (PT at his LTC facility)     Equipment Recommendations  None recommended by PT       Precautions / Restrictions Precautions Precautions: Fall Restrictions Weight Bearing Restrictions: No    Mobility  Bed Mobility Overal bed mobility: Needs Assistance Bed Mobility: Supine to Sit;Sit to Supine Rolling: +2 for physical assistance;+2 for safety/equipment;Max assist;Total assist   Supine to sit: Max assist;+2 for physical assistance Sit to supine: Max assist;+2 for physical assistance   General bed mobility comments: Max assist +2 to roll R to short sit. extremely pain limited. Was able to tolerate sitting EOB x ~ 10 minutes while taking medication and performing ther ex EOB.    Transfers    General transfer comment: pt was unwilling to attempt transfer 2/2 to pain.    Balance Overall balance assessment: Needs assistance Sitting-balance support: Bilateral upper extremity supported;Feet supported Sitting balance-Leahy Scale: Poor Sitting balance - Comments: posterior push throughout sitting       Standing balance comment: pt unwilling      Cognition Arousal/Alertness: Awake/alert Behavior During Therapy: WFL for tasks assessed/performed Overall Cognitive Status: Within Functional Limits for tasks assessed    General Comments: Pt A and O x 4, gruff, pain limited      Exercises General Exercises - Lower Extremity Ankle Circles/Pumps: AROM;Both;10 reps;Supine Quad Sets: AROM;5 reps;Both Long Arc Quad: AROM;15 reps;Both Hip Flexion/Marching: Seated;10 reps;AAROM        Pertinent Vitals/Pain Pain Assessment: 0-10 Pain Score: 9  Faces Pain Scale: Hurts whole lot Pain Location: R knee, neck Pain Descriptors / Indicators: Aching;Discomfort;Grimacing Pain Intervention(s): Limited activity within patient's tolerance;Monitored during session;Premedicated before session;Repositioned     PT Goals (current goals can now be found in the care plan section) Acute Rehab PT Goals Patient Stated Goal: decrease pain Progress towards PT goals: Not progressing toward goals - comment (pain limiting)    Frequency    Min 2X/week      PT Plan Discharge plan needs to be updated;Other (comment) (Recommend DC back to [facility] for LTC)    Co-evaluation     PT goals addressed during session: Mobility/safety with mobility;Balance;Proper use of DME;Strengthening/ROM        AM-PAC PT "6 Clicks" Mobility   Outcome Measure  Help needed turning from your back to your side  while in a flat bed without using bedrails?: A Lot Help needed moving from lying on your back to sitting on the side of a flat bed without using bedrails?: A Lot Help needed moving to and from a bed to a chair (including a wheelchair)?: Total Help needed standing up from a chair  using your arms (e.g., wheelchair or bedside chair)?: Total Help needed to walk in hospital room?: Total Help needed climbing 3-5 steps with a railing? : Total 6 Click Score: 8    End of Session   Activity Tolerance: Patient limited by pain Patient left: in bed;with call bell/phone within reach;with bed alarm set Nurse Communication: Mobility status PT Visit Diagnosis: Difficulty in walking, not elsewhere classified (R26.2);Other abnormalities of gait and mobility (R26.89);Muscle weakness (generalized) (M62.81);History of falling (Z91.81)     Time: 3846-6599 PT Time Calculation (min) (ACUTE ONLY): 10 min  Charges:  $Therapeutic Activity: 8-22 mins                     Jetta Lout PTA 10/26/20, 9:28 AM

## 2020-10-26 NOTE — Progress Notes (Addendum)
ID Pt c/o severe neck pain and inability to move the neck Also has rt kne epain Hardware in neck and 2 CT scans , one done today does not show any infection or movt of hardware- there is stenosis with Degeneration Also had maroon stools following constipation/ileus and laxatives Seen by GI- eliquis on hold Plan for scope if needed Patient Vitals for the past 24 hrs:  BP Temp Pulse Resp SpO2  10/26/20 1605 (!) 121/105 98 F (36.7 C) 94 20 98 %  10/26/20 1226 118/69 98.5 F (36.9 C) (!) 101 20 97 %  10/26/20 0859 114/70 98.3 F (36.8 C) (!) 108 18 95 %  10/26/20 0546 (!) 129/56 98.5 F (36.9 C) 97 16 96 %  10/25/20 2016 114/76 98.4 F (36.9 C) 86 18 95 %  10/25/20 1939 -- -- -- -- 94 %    In distress Awake and alert Chest b/l air entry Hs irregular  will controlled Rt knee no warmth No obvious swelling Abd soft  Labs CBC Latest Ref Rng & Units 10/26/2020 10/25/2020 10/21/2020  WBC 4.0 - 10.5 K/uL 13.0(H) 12.3(H) 9.9  Hemoglobin 13.0 - 17.0 g/dL 0.6(C) 3.7(S) 11.4(L)  Hematocrit 39.0 - 52.0 % 27.6(L) 30.5(L) 35.2(L)  Platelets 150 - 400 K/uL 367 393 311    CMP Latest Ref Rng & Units 10/26/2020 10/23/2020 10/22/2020  Glucose 70 - 99 mg/dL 283(T) 517(O) 160(V)  BUN 8 - 23 mg/dL 37(T) 06(Y) 69(S)  Creatinine 0.61 - 1.24 mg/dL 8.54 6.27 0.35  Sodium 135 - 145 mmol/L 134(L) 137 138  Potassium 3.5 - 5.1 mmol/L 4.4 4.5 4.4  Chloride 98 - 111 mmol/L 97(L) 103 105  CO2 22 - 32 mmol/L 30 28 25   Calcium 8.9 - 10.3 mg/dL 8.3(L) 8.5(L) 8.4(L)  Total Protein 6.5 - 8.1 g/dL - - -  Total Bilirubin 0.3 - 1.2 mg/dL - - -  Alkaline Phos 38 - 126 U/L - - -  AST 15 - 41 U/L - - -  ALT 0 - 44 U/L - - -    Micro Group c streptococcus bacteremia Anchor speech focus  Impression/recommendation Group C streptococcus bacteremia. TEE negative Has cervical hardware 2 CTs done shows no obvious infection He continues to have neck pain On Currently Cephalad intent to treat for 4 weeks.  May need  more OPAT note already entered  Constipation with ileus cleared up but had some maroon stools GI bleed question Eliquis on hold Anemia: Hemoglobin drop.  GI on board.  Eliquis on hold May get flex sig   Paroxysmal A. fib  Mitral regurgitation/congestive heart failure Mitral valve clip  Diabetes mellitus management as per primary team  AKI on CKD  Much improved  COPD Discussed with hospitalist  may get neurosurgery involved as he has hardware and persistent neck pain  I will be away 729 until 10/30/2020.  [facility] covering by phone.  Call if needed.

## 2020-10-26 NOTE — Progress Notes (Signed)
PROGRESS NOTE    Raymond Hays  ZOX:[id number] DOB: 06/03/1945 DOA: 10/16/2020 PCP: [facility]   Brief Narrative: Taken from prior notes. Raymond Hays is a 75 y.o. Caucasian male with medical history significant for type 2 diabetes mellitus, dyslipidemia, hypertension, COPD, and anxiety and allergic rhinitis, who presented to the ER with acute onset of generalized weakness over the last 4 days at her SNF. Admitted with sepsis secondary to group C streptococcus bacteremia.  Started on Rocephin and will need 4 weeks of antibiotics till 11/13/2020.  TTE is without any concern of endocarditis.  Repeat blood cultures on 05/22/2020 remains negative. Patient had neck, right knee and right elbow pain.  Orthopedic surgery did not believe the right knee or elbow was septic.  CT cervical spine did not show any osteomyelitis but does have old hardware there.  The patient developed ileus during the hospital course.  Patient finally had a bowel movement on 10/24/2020 and diet advanced to solid food.  PICC line placed.    Patient had large maroon-colored bowel movement overnight, drop in hemoglobin about 2 g. GI was consulted and Eliquis was stopped-going for EGD and flexible sigmoidoscopy tomorrow.  Repeat cervical spine imaging due to worsening neck pain was without any significant changes or signs of infection.  Right knee CT was also without any significant abnormality.  Subjective: Patient continued to complain about worsening neck and right knee pain.  Stating that now it hurts with swallowing also.  Had a large maroon-colored bowel movement overnight.  No bowel movement since morning.  Denies any shortness of breath.  Assessment & Plan:   Active Problems:   AF (paroxysmal atrial fibrillation) (HCC)   Acute pain of right knee   COPD with acute exacerbation (HCC)   Sepsis (HCC)   Neck pain   AKI (acute kidney injury) (HCC)   Type 2 diabetes mellitus with diabetic neuropathy, without long-term  current use of insulin (HCC)   Parkinson disease (HCC)   Ileus (HCC)  Sepsis secondary to group C streptococcal bacteremia.  Remained afebrile.  Continue to have neck pain.  Initial CT cervical spine done on 10/19/2020 was negative for any infection.  Patient do have hardware.  Repeat blood cultures remain negative. -ID was consulted-appreciate their recommendations. -Will need IV antibiotics till 11/13/2020, ID switched him to cefazolin and plan is to restart ceftriaxone on discharge for convenient dosing.  Neck pain.  Initial cervical spine imaging was negative for any osteomyelitis.  Patient do have hardware.  Repeat CT cervical spine and right knee was again without any significant new abnormality or signs of infection. -Schedule Robaxin -Continue lidocaine patch  Ileus.  Resolved having multiple bowel movements. -Back of from bowel regimen. -Continue to monitor  Concern of melena.  FOBT positive.  Hemoglobin dropped to 9.2, it was 11.4 few days ago. -GI was consulted-will be going for EGD and flexible sigmoidoscopy tomorrow. -Holding Eliquis -Monitor hemoglobin -Transfuse if below 7  COPD exacerbation.  Resolved.  Completed a course of steroid. -Continue with breathing treatment and supportive care  Chronic suprapubic catheter.  Changed by urology on 10/23/2020.  Concern of urinary leakage through penis. -Continue changing monthly. -Outpatient urology follow-up  Right knee pain and right elbow pain.  No signs of septic joints as per orthopedic surgery.  -Right knee CT scan was without any significant new abnormality or infection. - Diclofenac gel ordered for right knee.  AKI.  Resolved  Parkinson's disease. -Continue Sinemet.  Paroxysmal atrial fibrillation. -Continue with metoprolol -  Holding Eliquis for concern of GI bleed  Type 2 diabetes mellitus with neuropathy.  CBG improved after stopping steroid.  A1c of 6.6. -Continue with SSI -Continue with  gabapentin  Essential hypertension.  Blood pressure within goal. -Continue with metoprolol  Generalized weakness. -PT is recommending SNF placement-can go tomorrow if no significant abnormality on EGD. -COVID test ordered-negative  Objective: Vitals:   10/26/20 0546 10/26/20 0859 10/26/20 1226 10/26/20 1605  BP: (!) 129/56 114/70 118/69 (!) 121/105  Pulse: 97 (!) 108 (!) 101 94  Resp: 16 18 20 20   Temp: 98.5 F (36.9 C) 98.3 F (36.8 C) 98.5 F (36.9 C) 98 F (36.7 C)  TempSrc:      SpO2: 96% 95% 97% 98%  Weight:        Intake/Output Summary (Last 24 hours) at 10/26/2020 1634 Last data filed at 10/26/2020 0655 Gross per 24 hour  Intake 100 ml  Output 1500 ml  Net -1400 ml    Filed Weights   10/16/20 1534  Weight: 103 kg    Examination:  General.  Chronically ill-appearing gentleman, in no acute distress. Pulmonary.  Lungs clear bilaterally, normal respiratory effort. CV.  Regular rate and rhythm, no JVD, rub or murmur. Abdomen.  Soft, nontender, nondistended, BS positive. CNS.  Alert and oriented .  No focal neurologic deficit. Extremities.  1+ LE edema, no cyanosis, pulses intact and symmetrical. Psychiatry.  Judgment and insight appears normal.   DVT prophylaxis: SCDs, holding Eliquis due to GI bleed Code Status: Full Family Communication: Discussed with patient. Disposition Plan:  Status is: Inpatient  Remains inpatient appropriate because:Inpatient level of care appropriate due to severity of illness  Dispo: The patient is from: SNF              Anticipated d/c is to: SNF              Patient currently is medically stable to d/c.   Difficult to place patient No               Level of care: Med-Surg  All the records are reviewed and case discussed with Care Management/Social Worker. Management plans discussed with the patient, nursing and they are in agreement.  Consultants:  Infectious disease  Procedures:  Antimicrobials:  Cefazolin  Data  Reviewed: I have personally reviewed following labs and imaging studies  CBC: Recent Labs  Lab 10/21/20 0545 10/25/20 1703 10/26/20 0450  WBC 9.9 12.3* 13.0*  HGB 11.4* 9.9* 9.2*  HCT 35.2* 30.5* 27.6*  MCV 85.9 87.1 87.3  PLT 311 393 367    Basic Metabolic Panel: Recent Labs  Lab 10/21/20 0545 10/22/20 0527 10/23/20 0642 10/26/20 0450  NA 136 138 137 134*  K 4.9 4.4 4.5 4.4  CL 106 105 103 97*  CO2 23 25 28 30   GLUCOSE 228* 131* 140* 146*  BUN 46* 35* 26* 24*  CREATININE 0.96 0.85 0.75 0.86  CALCIUM 8.6* 8.4* 8.5* 8.3*    GFR: CrCl cannot be calculated (Unknown ideal weight.). Liver Function Tests: No results for input(s): AST, ALT, ALKPHOS, BILITOT, PROT, ALBUMIN in the last 168 hours. No results for input(s): LIPASE, AMYLASE in the last 168 hours. No results for input(s): AMMONIA in the last 168 hours. Coagulation Profile: Recent Labs  Lab 10/26/20 0900  INR 1.4*   Cardiac Enzymes: No results for input(s): CKTOTAL, CKMB, CKMBINDEX, TROPONINI in the last 168 hours. BNP (last 3 results) No results for input(s): PROBNP in the last 8760 hours.  HbA1C: No results for input(s): HGBA1C in the last 72 hours.  CBG: Recent Labs  Lab 10/25/20 1214 10/25/20 1702 10/25/20 2201 10/26/20 0901 10/26/20 1247  GLUCAP 185* 184* 174* 192* 184*    Lipid Profile: No results for input(s): CHOL, HDL, LDLCALC, TRIG, CHOLHDL, LDLDIRECT in the last 72 hours. Thyroid Function Tests: No results for input(s): TSH, T4TOTAL, FREET4, T3FREE, THYROIDAB in the last 72 hours. Anemia Panel: No results for input(s): VITAMINB12, FOLATE, FERRITIN, TIBC, IRON, RETICCTPCT in the last 72 hours. Sepsis Labs: No results for input(s): PROCALCITON, LATICACIDVEN in the last 168 hours.  Recent Results (from the past 240 hour(s))  Blood culture (routine x 2)     Status: Abnormal   Collection Time: 10/16/20  7:26 PM   Specimen: BLOOD LEFT FOREARM  Result Value Ref Range Status   Specimen  Description   Final    BLOOD LEFT FOREARM Performed at [facility], [address] [place], [place] [address]    Special Requests   Final    BOTTLES DRAWN AEROBIC AND ANAEROBIC Blood Culture adequate volume Performed at [facility], [address] [place], [place] [address]    Culture  Setup Time   Final    GRAM POSITIVE COCCI IN BOTH AEROBIC AND ANAEROBIC BOTTLES CRITICAL RESULT CALLED TO, READ BACK BY AND VERIFIED WITH: KRISTEN MERRIL, PHARMD AT 0830 ON 10/17/20 BY GM Performed at [facility], [address], [place], [place] [address]    Culture STREPTOCOCCUS GROUP C (A)  Final   Report Status 10/19/2020 FINAL  Final   Organism ID, Bacteria STREPTOCOCCUS GROUP C  Final      Susceptibility   Streptococcus group c - MIC*    CLINDAMYCIN <=0.25 SENSITIVE Sensitive     AMPICILLIN <=0.25 SENSITIVE Sensitive     ERYTHROMYCIN <=0.12 SENSITIVE Sensitive     VANCOMYCIN 0.5 SENSITIVE Sensitive     CEFTRIAXONE <=0.12      LEVOFLOXACIN 1 SENSITIVE Sensitive     PENICILLIN Value in next row Sensitive      SENSITIVE0.06    * STREPTOCOCCUS GROUP C  Blood Culture ID Panel (Reflexed)     Status: Abnormal   Collection Time: 10/16/20  7:26 PM  Result Value Ref Range Status   Enterococcus faecalis NOT DETECTED NOT DETECTED Final   Enterococcus Faecium NOT DETECTED NOT DETECTED Final   Listeria monocytogenes NOT DETECTED NOT DETECTED Final   Staphylococcus species NOT DETECTED NOT DETECTED Final   Staphylococcus aureus (BCID) NOT DETECTED NOT DETECTED Final   Staphylococcus epidermidis NOT DETECTED NOT DETECTED Final   Staphylococcus lugdunensis NOT DETECTED NOT DETECTED Final   Streptococcus species DETECTED (A) NOT DETECTED Final    Comment: Not Enterococcus species, Streptococcus agalactiae, Streptococcus pyogenes, or Streptococcus pneumoniae. CRITICAL RESULT CALLED TO, READ BACK BY AND VERIFIED WITH: KRISTEN MERRIL, PHARMD AT 0830 ON 10/17/20 BY GM     Streptococcus agalactiae NOT DETECTED NOT DETECTED Final   Streptococcus pneumoniae NOT DETECTED NOT DETECTED Final   Streptococcus pyogenes NOT DETECTED NOT DETECTED Final   A.calcoaceticus-baumannii NOT DETECTED NOT DETECTED Final   Bacteroides fragilis NOT DETECTED NOT DETECTED Final   Enterobacterales NOT DETECTED NOT DETECTED Final   Enterobacter cloacae complex NOT DETECTED NOT DETECTED Final   Escherichia coli NOT DETECTED NOT DETECTED Final   Klebsiella aerogenes NOT DETECTED NOT DETECTED Final   Klebsiella oxytoca NOT DETECTED NOT DETECTED Final   Klebsiella pneumoniae NOT DETECTED NOT DETECTED Final   Proteus species NOT  DETECTED NOT DETECTED Final   Salmonella species NOT DETECTED NOT DETECTED Final   Serratia marcescens NOT DETECTED NOT DETECTED Final   Haemophilus influenzae NOT DETECTED NOT DETECTED Final   Neisseria meningitidis NOT DETECTED NOT DETECTED Final   Pseudomonas aeruginosa NOT DETECTED NOT DETECTED Final   Stenotrophomonas maltophilia NOT DETECTED NOT DETECTED Final   Candida albicans NOT DETECTED NOT DETECTED Final   Candida auris NOT DETECTED NOT DETECTED Final   Candida glabrata NOT DETECTED NOT DETECTED Final   Candida krusei NOT DETECTED NOT DETECTED Final   Candida parapsilosis NOT DETECTED NOT DETECTED Final   Candida tropicalis NOT DETECTED NOT DETECTED Final   Cryptococcus neoformans/gattii NOT DETECTED NOT DETECTED Final    Comment: Performed at [facility], [address] [place], [place] [address]  Blood culture (routine x 2)     Status: None   Collection Time: 10/16/20 11:23 PM   Specimen: BLOOD RIGHT FOREARM  Result Value Ref Range Status   Specimen Description BLOOD RIGHT FOREARM  Final   Special Requests   Final    BOTTLES DRAWN AEROBIC AND ANAEROBIC Blood Culture adequate volume   Culture   Final    NO GROWTH 5 DAYS Performed at [facility], [address] [place], [place] [address]    Report Status 10/21/2020  FINAL  Final  CULTURE, BLOOD (ROUTINE X 2) w Reflex to ID Panel     Status: None   Collection Time: 10/19/20  1:11 AM   Specimen: BLOOD RIGHT FOREARM  Result Value Ref Range Status   Specimen Description BLOOD RIGHT FOREARM  Final   Special Requests   Final    BOTTLES DRAWN AEROBIC AND ANAEROBIC Blood Culture results may not be optimal due to an excessive volume of blood received in culture bottles   Culture   Final    NO GROWTH 5 DAYS Performed at [facility], [address] [place], [place] [address]    Report Status 10/24/2020 FINAL  Final  CULTURE, BLOOD (ROUTINE X 2) w Reflex to ID Panel     Status: None   Collection Time: 10/19/20  1:17 AM   Specimen: BLOOD RIGHT HAND  Result Value Ref Range Status   Specimen Description BLOOD RIGHT HAND  Final   Special Requests   Final    BOTTLES DRAWN AEROBIC AND ANAEROBIC Blood Culture adequate volume   Culture   Final    NO GROWTH 5 DAYS Performed at [facility], [address] [place], [place] [address]    Report Status 10/24/2020 FINAL  Final  SARS CORONAVIRUS 2 (TAT 6-24 HRS) Nasopharyngeal Nasopharyngeal Swab     Status: None   Collection Time: 10/25/20 11:00 AM   Specimen: Nasopharyngeal Swab  Result Value Ref Range Status   SARS Coronavirus 2 NEGATIVE NEGATIVE Final    Comment: (NOTE) SARS-CoV-2 target nucleic acids are NOT DETECTED.  The SARS-CoV-2 RNA is generally detectable in upper and lower respiratory specimens during the acute phase of infection. Negative results do not preclude SARS-CoV-2 infection, do not rule out co-infections with other pathogens, and should not be used as the sole basis for treatment or other patient management decisions. Negative results must be combined with clinical observations, patient history, and epidemiological information. The expected result is Negative.  Fact Sheet for Patients: HairSlick.no  Fact Sheet for Healthcare  Providers: quierodirigir.com  This test is not yet approved or cleared by the [place] FDA and  has been authorized for detection  and/or diagnosis of SARS-CoV-2 by FDA under an Emergency Use Authorization (EUA). This EUA will remain  in effect (meaning this test can be used) for the duration of the COVID-19 declaration under Se ction 564(b)(1) of the Act, 21 U.S.C. section 360bbb-3(b)(1), unless the authorization is terminated or revoked sooner.  Performed at [facility], [address] [place], [place] [address]       Radiology Studies: CT CERVICAL SPINE W CONTRAST  Result Date: 10/26/2020 CLINICAL DATA:  Neck pain, acute, infection suspected. Patient with Gp C strep bactremia, neck hardware with worsening pain. EXAM: CT CERVICAL SPINE WITH CONTRAST TECHNIQUE: Multidetector CT imaging of the cervical spine was performed during intravenous contrast administration. Multiplanar CT image reconstructions were also generated. CONTRAST:  75mL OMNIPAQUE IOHEXOL 350 MG/ML SOLN COMPARISON:  10/18/2020 FINDINGS: Alignment: Unchanged cervical spine straightening. No significant listhesis. Skull base and vertebrae: Postsurgical changes from C4-5 corpectomy, C3-C6 anterior fusion, and C3-C7 posterior fusion and laminectomies as previously detailed. Remote, solid interbody fusion at C6-7. Advanced C1-2 arthropathy with unchanged mild basilar impression. No interval osseous destruction to suggest osteomyelitis. No acute fracture. Soft tissues and spinal canal: Limited assessment of the spinal canal due to streak artifact. No gross prevertebral fluid or significant prevertebral soft tissue swelling. Disc levels: Unchanged appearance of postoperative and degenerative changes in the cervical and included upper thoracic spine with multilevel neural foraminal stenosis as detailed on the recent prior CT. Upper chest: Clear lung apices. Other: Extensive motion artifact through the  pharynx and larynx. IMPRESSION: No acute osseous abnormality identified in the cervical spine. Limited assessment of the spinal canal due to artifact. MRI would provide much better assessment for epidural abscess or early changes of discitis-osteomyelitis. Electronically Signed   By: Sebastian AcheAllen  Grady M.D.   On: 10/26/2020 14:06   CT KNEE RIGHT W CONTRAST  Result Date: 10/26/2020 CLINICAL DATA:  Right knee pain and spasms. Diabetic patient with streptococcal bacteremia and sepsis. EXAM: CT OF THE RIGHT KNEE WITH CONTRAST TECHNIQUE: Multidetector CT imaging was performed following the standard protocol during bolus administration of intravenous contrast. CONTRAST:  75 mL OMNIPAQUE IOHEXOL 350 MG/ML SOLN COMPARISON:  None. FINDINGS: Bones/Joint/Cartilage The exam is degraded by patient motion despite repeat imaging. No fracture or focal bone lesion is seen. No joint effusion is identified. Chondrocalcinosis of the menisci is noted. Ligaments Suboptimally assessed by CT. Muscles and Tendons Grossly intact. Soft tissues There is some subcutaneous edema about the knee and visualized lower leg, worse on the lower leg. No focal fluid collection. No soft tissue gas. No radiopaque foreign body. IMPRESSION: Motion degraded exam. No acute bony or joint abnormality is identified. Subcutaneous edema about the knee and lower leg could be due to dependent change or cellulitis. No focal fluid collection is seen. Electronically Signed   By: Drusilla Kannerhomas  Dalessio M.D.   On: 10/26/2020 15:37    Scheduled Meds:  ascorbic acid  500 mg Oral Daily   benzonatate  100 mg Oral TID   budesonide (PULMICORT) nebulizer solution  0.5 mg Nebulization BID   carbidopa-levodopa  1 tablet Oral TID   Chlorhexidine Gluconate Cloth  6 each Topical Daily   diclofenac Sodium  4 g Topical QID   escitalopram  5 mg Oral Daily   feeding supplement  237 mL Oral BID BM   finasteride  5 mg Oral Daily   fluticasone  1 spray Each Nare Daily   furosemide  20  mg Oral Daily   gabapentin  800 mg  Oral BID   guaiFENesin  600 mg Oral Q12H   insulin aspart  0-5 Units Subcutaneous QHS   insulin aspart  0-9 Units Subcutaneous TID WC   ipratropium  1 spray Each Nare BID   ipratropium-albuterol  3 mL Nebulization TID   lactase  3,000 Units Oral TID WC   lactulose  30 g Oral Daily   lidocaine  1 patch Transdermal Q24H   loratadine  10 mg Oral Daily   methocarbamol  500 mg Oral TID   metoprolol succinate  50 mg Oral Daily   montelukast  10 mg Oral QHS   sodium chloride flush  10-40 mL Intracatheter Q12H   tamsulosin  0.4 mg Oral Daily   traZODone  100 mg Oral QHS   Continuous Infusions:  sodium chloride      ceFAZolin (ANCEF) IV 2 g (10/26/20 1515)   lactated ringers 100 mL/hr at 10/26/20 1019     LOS: 10 days   Time spent: 35 minutes. More than 50% of the time was spent in counseling/coordination of care  Arnetha Courser, MD [facility]  If 7PM-7AM, please contact night-coverage [email or web address]  10/26/2020, 4:34 PM   This record has been created using Conservation officer, historic buildings. Errors have been sought and corrected,but may not always be located. Such creation errors do not reflect on the standard of care.

## 2020-10-26 NOTE — Consult Note (Signed)
Referring Physician:  No referring provider defined for this encounter.  Primary Physician:  [facility]  Chief Complaint:  neck pain  History of Present Illness: 10/26/2020 Tranquilino Fischler is a 75 y.o. male who presents with the chief complaint of neck pain.  He was admitted for sepsis and has been on antibiotics.  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    He has significant neck pain in the mornings.  This pain is somewhat worse than he has been dealing with.  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  Review of Systems:  A 10 point review of systems is negative, except for the pertinent positives and negatives detailed in the HPI.  Past Medical History: Past Medical History:  Diagnosis Date   Allergic rhinitis    Anxiety    Arthritis    Chronic indwelling Foley catheter    COPD (chronic obstructive pulmonary disease) (HCC)    Cough    Diabetes (HCC)    Essential (primary) hypertension    Hemorrhoid    Hyperlipidemia    PAF (paroxysmal atrial fibrillation) (HCC)    Primary osteoarthritis, unspecified shoulder    Retention of urine, unspecified    Spinal stenosis    UTI (urinary tract infection)     Past Surgical History: Past Surgical History:  Procedure Laterality Date   APPENDECTOMY     BACK SURGERY     ELBOW SURGERY     HERNIA REPAIR     KNEE SURGERY     neck     PARTIAL HIP ARTHROPLASTY Bilateral    TEE WITHOUT CARDIOVERSION N/A 10/19/2020   Procedure: TRANSESOPHAGEAL ECHOCARDIOGRAM (TEE);  Surgeon: Antonieta Iba, MD;  Location: [facility] ORS;  Service: Cardiovascular;  Laterality: N/A;   TONSILLECTOMY      Allergies: Allergies as of 10/16/2020   (No Known Allergies)    Medications:  Current Facility-Administered Medications:    0.9 %  sodium chloride infusion, , Intravenous, Continuous, Wyline Mood, MD   acetaminophen (TYLENOL) tablet 650 mg, 650 mg, Oral, Q6H PRN,  650 mg at 10/25/20 0854 **OR** acetaminophen (TYLENOL) suppository 650 mg, 650 mg, Rectal, Q6H PRN, Mansy, Jan A, MD   ascorbic acid (VITAMIN C) tablet 500 mg, 500 mg, Oral, Daily, Mansy, Jan A, MD, 500 mg at 10/26/20 9417   benzonatate (TESSALON) capsule 100 mg, 100 mg, Oral, TID, Wieting, Richard, MD, 100 mg at 10/26/20 4081   bisacodyl (DULCOLAX) suppository 10 mg, 10 mg, Rectal, PRN, Mansy, Jan A, MD, 10 mg at 10/24/20 4481   budesonide (PULMICORT) nebulizer solution 0.5 mg, 0.5 mg, Nebulization, BID, Wieting, Richard, MD, 0.5 mg at 10/26/20 8563   calcium carbonate (TUMS - dosed in mg elemental calcium) chewable tablet 400 mg of elemental calcium, 2 tablet, Oral, Q6H PRN, Mansy, Jan A, MD   carbidopa-levodopa (SINEMET IR) 25-100 MG per tablet immediate release 1 tablet, 1 tablet, Oral, TID, Mansy, Jan A, MD, 1 tablet at 10/26/20 1246   ceFAZolin (ANCEF) IVPB 2g/100 mL premix, 2 g, Intravenous, Q8H, Ravishankar, Jayashree, MD, Last Rate: 200 mL/hr at 10/26/20 1515, 2 g at 10/26/20 1515   Chlorhexidine Gluconate Cloth 2 % PADS 6 each, 6 each, Topical, Daily, Mansy, Jan A, MD, 6 each at 10/26/20 1145   chlorpheniramine-HYDROcodone (TUSSIONEX) 10-8 MG/5ML suspension 5 mL, 5 mL, Oral, Q12H PRN, Mansy, Jan A, MD, 5 mL at 10/21/20 0058   diclofenac Sodium (VOLTAREN) 1 % topical gel 4  g, 4 g, Topical, QID, Wieting, Richard, MD, 4 g at 10/26/20 1400   escitalopram (LEXAPRO) tablet 5 mg, 5 mg, Oral, Daily, Mansy, Jan A, MD, 5 mg at 10/26/20 0910   feeding supplement (ENSURE ENLIVE / ENSURE PLUS) liquid 237 mL, 237 mL, Oral, BID BM, Sreenath, Sudheer B, MD, 237 mL at 10/26/20 1400   finasteride (PROSCAR) tablet 5 mg, 5 mg, Oral, Daily, Mansy, Jan A, MD, 5 mg at 10/26/20 0909   fluticasone (FLONASE) 50 MCG/ACT nasal spray 1 spray, 1 spray, Each Nare, Daily, Mansy, Jan A, MD, 1 spray at 10/26/20 0910   furosemide (LASIX) tablet 20 mg, 20 mg, Oral, Daily, Wieting, Richard, MD, 20 mg at 10/26/20 5621    gabapentin (NEURONTIN) capsule 800 mg, 800 mg, Oral, BID, Mansy, Jan A, MD, 800 mg at 10/26/20 3086   guaiFENesin (MUCINEX) 12 hr tablet 600 mg, 600 mg, Oral, Q12H, Mansy, Jan A, MD, 600 mg at 10/26/20 5784   hydrOXYzine (ATARAX/VISTARIL) tablet 25 mg, 25 mg, Oral, TID PRN, Mansy, Jan A, MD   insulin aspart (novoLOG) injection 0-5 Units, 0-5 Units, Subcutaneous, QHS, Wieting, Richard, MD, 2 Units at 10/23/20 2221   insulin aspart (novoLOG) injection 0-9 Units, 0-9 Units, Subcutaneous, TID WC, Wieting, Richard, MD, 2 Units at 10/26/20 1259   ipratropium (ATROVENT) 0.03 % nasal spray 1 spray, 1 spray, Each Nare, BID, Mansy, Jan A, MD, 1 spray at 10/26/20 0910   ipratropium-albuterol (DUONEB) 0.5-2.5 (3) MG/3ML nebulizer solution 3 mL, 3 mL, Nebulization, TID, Sreenath, Sudheer B, MD, 3 mL at 10/26/20 1337   lactase (LACTAID) tablet 3,000 Units, 3,000 Units, Oral, TID WC, Mansy, Jan A, MD, 3,000 Units at 10/26/20 1259   lactated ringers infusion, , Intravenous, Continuous, Amin, Tilman Neat, MD, Last Rate: 100 mL/hr at 10/26/20 1019, New Bag at 10/26/20 1019   lactulose (CHRONULAC) 10 GM/15ML solution 30 g, 30 g, Oral, Daily, Wieting, Richard, MD   lidocaine (LIDODERM) 5 % 1 patch, 1 patch, Transdermal, Q24H, Arnetha Courser, MD, 1 patch at 10/26/20 0913   loratadine (CLARITIN) tablet 10 mg, 10 mg, Oral, Daily, Mansy, Jan A, MD, 10 mg at 10/26/20 6962   magnesium hydroxide (MILK OF MAGNESIA) suspension 30 mL, 30 mL, Oral, Daily PRN, Mansy, Jan A, MD   methocarbamol (ROBAXIN) tablet 500 mg, 500 mg, Oral, TID, Arnetha Courser, MD, 500 mg at 10/26/20 0909   metoprolol succinate (TOPROL-XL) 24 hr tablet 50 mg, 50 mg, Oral, Daily, Mansy, Jan A, MD, 50 mg at 10/26/20 0908   montelukast (SINGULAIR) tablet 10 mg, 10 mg, Oral, QHS, Mansy, Jan A, MD, 10 mg at 10/25/20 2123   ondansetron (ZOFRAN) tablet 4 mg, 4 mg, Oral, Q6H PRN, 4 mg at 10/21/20 0058 **OR** ondansetron (ZOFRAN) injection 4 mg, 4 mg, Intravenous, Q6H PRN,  Mansy, Jan A, MD   sodium chloride flush (NS) 0.9 % injection 10-40 mL, 10-40 mL, Intracatheter, Q12H, Wieting, Richard, MD, 10 mL at 10/26/20 0915   sodium chloride flush (NS) 0.9 % injection 10-40 mL, 10-40 mL, Intracatheter, PRN, Renae Gloss, Richard, MD   tamsulosin [facility]) capsule 0.4 mg, 0.4 mg, Oral, Daily, Mansy, Jan A, MD, 0.4 mg at 10/26/20 0908   traMADol (ULTRAM) tablet 100 mg, 100 mg, Oral, TID PRN, Georgeann Oppenheim, Sudheer B, MD, 100 mg at 10/26/20 1052   traZODone (DESYREL) tablet 100 mg, 100 mg, Oral, QHS, Mansy, Jan A, MD, 100 mg at 10/25/20 2123   Social History: Social History   Tobacco Use   Smoking status:  Former    Packs/day: 0.50    Years: 10.00    Pack years: 5.00    Types: Cigarettes    Quit date: 2006    Years since quitting: 16.5   Smokeless tobacco: Never  Vaping Use   Vaping Use: Never used  Substance Use Topics   Alcohol use: Not Currently   Drug use: Not Currently    Types: "Crack" cocaine    Comment: quit "crack" 15 years ago--11/04/2019    Family Medical History: Family History  Problem Relation Age of Onset   Healthy Son    Healthy Son     Physical Examination: Vitals:   10/26/20 1226 10/26/20 1605  BP: 118/69 (!) 121/105  Pulse: (!) 101 94  Resp: 20 20  Temp: 98.5 F (36.9 C) 98 F (36.7 C)  SpO2: 97% 98%     General: Patient is well developed, well nourished, calm, collected, and in no apparent distress.  Psychiatric: Patient is non-anxious.  Head:  Pupils equal, round, and reactive to light.  ENT:  Oral mucosa appears well hydrated.  Neck:   Supple.  Limited range of motion.  Respiratory: Patient is breathing without any difficulty but cannot say more than 1 sentence without breathing.  Extremities: No edema.  Vascular: Palpable pulses in dorsal pedal vessels.  Skin:   On exposed skin, there are no abnormal skin lesions.  NEUROLOGICAL:  General: In no acute distress.   Awake, alert, oriented to person, place, and time.   Pupils equal round and reactive to light.  Facial tone is symmetric.  Tongue protrusion is midline.     Strength: Side Biceps Triceps Deltoid Interossei Grip Wrist Ext. Wrist Flex.  R 4+ 4+ 4 4- 4 4+ 5  L 5 5 4+ 2 4- - -   Side Iliopsoas Quads Hamstring PF DF EHL  R 3 5 5 5 5 5   L 3 5 5 5 5 5     Bilateral upper and lower extremity sensation is intact to light touch. Reflexes are 3+ and symmetric at the biceps, triceps, brachioradialis, patella and achilles. Hoffman's is absent.  Clonus is present (2 beats)  Gait is untested.  Imaging: CT Cerv Spine 10/26/2020 IMPRESSION: No acute osseous abnormality identified in the cervical spine. Limited assessment of the spinal canal due to artifact. MRI would provide much better assessment for epidural abscess or early changes of discitis-osteomyelitis.     Electronically Signed   By: M.D.   On: 10/26/2020 14:06  I have personally reviewed the images and agree with the above interpretation.  Labs: CBC Latest Ref Rng & Units 10/26/2020 10/25/2020 10/21/2020  WBC 4.0 - 10.5 K/uL 13.0(H) 12.3(H) 9.9  Hemoglobin 13.0 - 17.0 g/dL 10/27/2020) 10/23/2020) 11.4(L)  Hematocrit 39.0 - 52.0 % 27.6(L) 30.5(L) 35.2(L)  Platelets 150 - 400 K/uL 367 393 311       Assessment and Plan: Mr. Mcclafferty is a pleasant 75 y.o. male with severe neck pain and history of cervical myelopathy.  Though his CT is reassuring, I would recommend MRI C spine with and without contrast.    He may need some adjustments to his medications.   I would consider NSAIDs if appropriate with his other medical conditions, and consider diazepam for PRN muscle relaxation.  We will follow after his MRI scan.  Crystalynn Mcinerney K. Caryn Section MD, MPHS Dept. of Neurosurgery

## 2020-10-27 ENCOUNTER — Encounter
Admission: EM | Disposition: A | Discharge status: Skilled Nursing Facility | Payer: Self-pay | Source: Skilled Nursing Facility | Attending: Internal Medicine

## 2020-10-27 ENCOUNTER — Inpatient Hospital Stay: Payer: No Typology Code available for payment source | Admitting: Anesthesiology

## 2020-10-27 ENCOUNTER — Encounter: Payer: Self-pay | Admitting: Family Medicine

## 2020-10-27 DIAGNOSIS — R7881 Bacteremia: Secondary | ICD-10-CM | POA: Diagnosis not present

## 2020-10-27 DIAGNOSIS — B955 Unspecified streptococcus as the cause of diseases classified elsewhere: Secondary | ICD-10-CM | POA: Diagnosis not present

## 2020-10-27 HISTORY — PX: ESOPHAGOGASTRODUODENOSCOPY (EGD) WITH PROPOFOL: SHX5813

## 2020-10-27 HISTORY — PX: COLONOSCOPY: SHX5424

## 2020-10-27 LAB — BASIC METABOLIC PANEL
Anion gap: 4 — ABNORMAL LOW (ref 5–15)
BUN: 18 mg/dL (ref 8–23)
CO2: 32 mmol/L (ref 22–32)
Calcium: 8.2 mg/dL — ABNORMAL LOW (ref 8.9–10.3)
Chloride: 99 mmol/L (ref 98–111)
Creatinine, Ser: 0.71 mg/dL (ref 0.61–1.24)
GFR, Estimated: >60 mL/min (ref >60)
Glucose, Bld: 128 mg/dL — ABNORMAL HIGH (ref 70–99)
Potassium: 4.1 mmol/L (ref 3.5–5.1)
Sodium: 135 mmol/L (ref 135–145)

## 2020-10-27 LAB — RETICULOCYTES
Immature Retic Fract: 27.4 % — ABNORMAL HIGH (ref 2.3–15.9)
RBC.: 3.22 MIL/uL — ABNORMAL LOW (ref 4.22–5.81)
Retic Count, Absolute: 128.8 10*3/uL (ref 19.0–186.0)
Retic Ct Pct: 4 % — ABNORMAL HIGH (ref 0.4–3.1)

## 2020-10-27 LAB — KOH PREP: Special Requests: NORMAL

## 2020-10-27 LAB — CBC
HCT: 25.6 % — ABNORMAL LOW (ref 39.0–52.0)
Hemoglobin: 8.5 g/dL — ABNORMAL LOW (ref 13.0–17.0)
MCH: 29.1 pg (ref 26.0–34.0)
MCHC: 33.2 g/dL (ref 30.0–36.0)
MCV: 87.7 fL (ref 80.0–100.0)
Platelets: 336 10*3/uL (ref 150–400)
RBC: 2.92 MIL/uL — ABNORMAL LOW (ref 4.22–5.81)
RDW: 15.7 % — ABNORMAL HIGH (ref 11.5–15.5)
WBC: 10.3 10*3/uL (ref 4.0–10.5)
nRBC: 0 % (ref 0.0–0.2)

## 2020-10-27 LAB — GLUCOSE, CAPILLARY
Glucose-Capillary: 115 mg/dL — ABNORMAL HIGH (ref 70–99)
Glucose-Capillary: 109 mg/dL — ABNORMAL HIGH (ref 70–99)
Glucose-Capillary: 201 mg/dL — ABNORMAL HIGH (ref 70–99)
Glucose-Capillary: 134 mg/dL — ABNORMAL HIGH (ref 70–99)

## 2020-10-27 LAB — IRON AND TIBC
Iron: 28 ug/dL — ABNORMAL LOW (ref 45–182)
Saturation Ratios: 12 % — ABNORMAL LOW (ref 17.9–39.5)
TIBC: 244 ug/dL — ABNORMAL LOW (ref 250–450)
UIBC: 216 ug/dL

## 2020-10-27 LAB — VITAMIN B12: Vitamin B-12: 355 pg/mL (ref 180–914)

## 2020-10-27 LAB — FERRITIN: Ferritin: 227 ng/mL (ref 24–336)

## 2020-10-27 LAB — FOLATE: Folate: 8.4 ng/mL (ref >5.9)

## 2020-10-27 SURGERY — ESOPHAGOGASTRODUODENOSCOPY (EGD) WITH PROPOFOL
Anesthesia: Monitor Anesthesia Care

## 2020-10-27 MED ORDER — PANTOPRAZOLE SODIUM 40 MG PO TBEC
40.0000 mg | DELAYED_RELEASE_TABLET | Freq: Two times a day (BID) | ORAL | Status: DC
  Administered 2020-10-27 – 2020-11-01 (×10): 40 mg via ORAL
  Filled 2020-10-27 (×10): qty 1

## 2020-10-27 MED ORDER — PROPOFOL 10 MG/ML IV BOLUS
INTRAVENOUS | Status: DC | PRN
  Administered 2020-10-27 (×3): 40 mg via INTRAVENOUS
  Administered 2020-10-27: 100 mg via INTRAVENOUS

## 2020-10-27 MED ORDER — HYDROMORPHONE HCL 1 MG/ML IJ SOLN
1.0000 mg | Freq: Four times a day (QID) | INTRAMUSCULAR | Status: DC | PRN
  Administered 2020-10-27 – 2020-10-28 (×3): 1 mg via INTRAVENOUS
  Filled 2020-10-27 (×3): qty 1

## 2020-10-27 MED ORDER — FERROUS SULFATE 325 (65 FE) MG PO TABS
325.0000 mg | ORAL_TABLET | Freq: Two times a day (BID) | ORAL | Status: DC
  Administered 2020-10-27 – 2020-10-30 (×6): 325 mg via ORAL
  Filled 2020-10-27 (×6): qty 1

## 2020-10-27 MED ORDER — LIDOCAINE HCL (CARDIAC) PF 100 MG/5ML IV SOSY
PREFILLED_SYRINGE | INTRAVENOUS | Status: DC | PRN
  Administered 2020-10-27: 100 mg via INTRAVENOUS

## 2020-10-27 NOTE — Anesthesia Postprocedure Evaluation (Signed)
Anesthesia Post Note  Patient: Raymond Hays  Procedure(s) Performed: ESOPHAGOGASTRODUODENOSCOPY (EGD) WITH PROPOFOL COLONOSCOPY  Anesthesia Type: MAC Level of consciousness: awake and alert Pain management: pain level controlled Vital Signs Assessment: post-procedure vital signs reviewed and stable Respiratory status: spontaneous breathing Cardiovascular status: blood pressure returned to baseline Postop Assessment: no headache Anesthetic complications: yes   No notable events documented.   Last Vitals:  Vitals:   10/27/20 0732 10/27/20 1235  BP: 127/76 124/75  Pulse: 74   Resp: 17   Temp: 36.7 C 36.7 C  SpO2: (!) 81%     Last Pain:  Vitals:   10/27/20 1255  TempSrc:   PainSc: 10-Worst pain ever                 Milinda Pointer

## 2020-10-27 NOTE — Op Note (Signed)
[facility] Gastroenterology Patient Name: Raymond Hays Procedure Date: 10/27/2020 12:06 PM MRN: [id number] Account #: [id number] Date of Birth: 1945-07-06 Admit Type: Inpatient Age: 75 Room: [facility] ENDO ROOM 4 Gender: Male Note Status: Finalized Procedure:             Upper GI endoscopy Indications:           Melena Providers:             Wyline Mood MD, MD Referring MD:          [facility], MD (Referring MD) Medicines:             Monitored Anesthesia Care Complications:         No immediate complications. Procedure:             Pre-Anesthesia Assessment:                        - Prior to the procedure, a History and Physical was                         performed, and patient medications, allergies and                         sensitivities were reviewed. The patient's tolerance                         of previous anesthesia was reviewed.                        - The risks and benefits of the procedure and the                         sedation options and risks were discussed with the                         patient. All questions were answered and informed                         consent was obtained.                        - ASA Grade Assessment: III - A patient with severe                         systemic disease.                        After obtaining informed consent, the endoscope was                         passed under direct vision. Throughout the procedure,                         the patient's blood pressure, pulse, and oxygen                         saturations were monitored continuously. The                         Endosonoscope was introduced through the mouth, and  advanced to the third part of duodenum. The upper GI                         endoscopy was accomplished with ease. The patient                         tolerated the procedure well. Findings:      Patchy, white plaques were found in the entire esophagus. Brushings for        KOH prep were obtained in the entire esophagus.      The stomach was normal.      Three non-bleeding superficial duodenal ulcers with a clean ulcer base       (Forrest Class III) were found in the second portion of the duodenum.       The largest lesion was 10 mm in largest dimension.      The cardia and gastric fundus were normal on retroflexion. Impression:            - Esophageal plaques were found, suspicious for                         candidiasis. Brushings performed.                        - Normal stomach.                        - Non-bleeding duodenal ulcers with a clean ulcer base                         (Forrest Class III). Recommendation:        - Perform a colonoscopy today. Procedure Code(s):     --- Professional ---                        [phone], Esophagogastroduodenoscopy, flexible,                         transoral; diagnostic, including collection of                         specimen(s) by brushing or washing, when performed                         (separate procedure) Diagnosis Code(s):     --- Professional ---                        K22.9, Disease of esophagus, unspecified                        K26.9, Duodenal ulcer, unspecified as acute or                         chronic, without hemorrhage or perforation                        K92.1, Melena (includes Hematochezia) CPT copyright 2019 [employer]. All rights reserved. The codes documented in this report are preliminary and upon coder review may  be revised to meet current compliance requirements. Wyline Mood, MD Wyline Mood MD, MD 10/27/2020 12:17:16 PM  This report has been signed electronically. Number of Addenda: 0 Note Initiated On: 10/27/2020 12:06 PM Estimated Blood Loss:  Estimated blood loss: none.      [facility]

## 2020-10-27 NOTE — Progress Notes (Signed)
PROGRESS NOTE    Raymond GreenerSteven Hays  ZOX:[id number] DOB: 12/13/1945 DOA: 10/16/2020 PCP: [facility]   Brief Narrative: Taken from prior notes. Raymond Hays is a 75 y.o. Caucasian male with medical history significant for type 2 diabetes mellitus, dyslipidemia, hypertension, COPD, and anxiety and allergic rhinitis, who presented to the ER with acute onset of generalized weakness over the last 4 days at her SNF. Admitted with sepsis secondary to group C streptococcus bacteremia.  Started on Rocephin and will need 4 weeks of antibiotics till 11/13/2020.  TTE is without any concern of endocarditis.  Repeat blood cultures on 05/22/2020 remains negative. Patient had neck, right knee and right elbow pain.  Orthopedic surgery did not believe the right knee or elbow was septic.  CT cervical spine did not show any osteomyelitis but does have old hardware there.  The patient developed ileus during the hospital course.  Patient finally had a bowel movement on 10/24/2020 and diet advanced to solid food.  PICC line placed.    Patient had large maroon-colored bowel movement overnight, drop in hemoglobin about 2 g. GI was consulted and Eliquis was stopped-going for EGD and flexible sigmoidoscopy tomorrow.  Repeat cervical spine imaging due to worsening neck pain was without any significant changes or signs of infection.  Right knee CT was also without any significant abnormality.  Neurosurgery was consulted due to worsening neck pain and an MRI was done with and without contrast which shows some concern of edema/possible early infection at the base of skull and involving C1-C3, no intervention needed per neurosurgery at this time, might need little prolonged duration of antibiotics-we will defer that decision to infectious disease.  Subjective: Patient was seen after EGD and sigmoidoscopy.  Continues to complain about worsening neck pain. Tramadol is not helping he wants something stronger.  Assessment & Plan:    Active Problems:   AF (paroxysmal atrial fibrillation) (HCC)   Acute pain of right knee   COPD with acute exacerbation (HCC)   Sepsis (HCC)   Neck pain   AKI (acute kidney injury) (HCC)   Type 2 diabetes mellitus with diabetic neuropathy, without long-term current use of insulin (HCC)   Parkinson disease (HCC)   Ileus (HCC)  Sepsis secondary to group C streptococcal bacteremia.  Remained afebrile.  Continue to have neck pain.  Initial CT cervical spine done on 10/19/2020 was negative for any infection.  Patient do have hardware.  Repeat blood cultures remain negative. -ID was consulted-appreciate their recommendations. -Will need IV antibiotics till 11/13/2020, ID switched him to cefazolin and plan is to restart ceftriaxone on discharge for convenient dosing.  Might need little more prolonged outpatient antibiotics, will appreciate ID input regarding MRI findings.  No surgical intervention needed per neurosurgery.  Neck pain.  Initial cervical spine imaging was negative for any osteomyelitis.  Patient do have hardware.  Repeat CT cervical spine and right knee was again without any significant new abnormality or signs of infection.  Neurosurgery was also consulted and MRI was done with concern of some edema and possible signs of early infection involving C1-C3 and the base of skull. No need for any surgical intervention at this time. -We will add Dilaudid along with tramadol to see if that will help with his pain. -Continue Robaxin -Continue lidocaine patch  Ileus.  Resolved having multiple bowel movements. -Back of from bowel regimen. -Continue to monitor  Concern of melena.  FOBT positive.  Hemoglobin dropped to 8.5 this morning,  -GI was consulted-patient underwent  EGD and sigmoidoscopy today, found to have a nonbleeding duodenal ulcer, GI is recommending holding Eliquis for another 5 days and starting him on twice daily PPI for 8 weeks. -Holding Eliquis-for another 5 days as recommended  by GI.  Will be resumed on 11/02/2020 -Protonix twice daily -Monitor hemoglobin -Transfuse if below 7  COPD exacerbation.  Resolved.  Completed a course of steroid. -Continue with breathing treatment and supportive care  Chronic suprapubic catheter.  Changed by urology on 10/23/2020.  Concern of urinary leakage through penis. -Continue changing monthly. -Outpatient urology follow-up  Right knee pain and right elbow pain.  No signs of septic joints as per orthopedic surgery.  -Right knee CT scan was without any significant new abnormality or infection. - Diclofenac gel ordered for right knee.  AKI.  Resolved  Parkinson's disease. -Continue Sinemet.  Paroxysmal atrial fibrillation. -Continue with metoprolol -Holding Eliquis for concern of GI bleed  Type 2 diabetes mellitus with neuropathy.  CBG improved after stopping steroid.  A1c of 6.6. -Continue with SSI -Continue with gabapentin  Essential hypertension.  Blood pressure within goal. -Continue with metoprolol  Generalized weakness. -PT is recommending SNF placement-can go tomorrow if no significant abnormality on EGD. -COVID test ordered-negative  Objective: Vitals:   10/27/20 0047 10/27/20 0457 10/27/20 0718 10/27/20 0732  BP: (!) 120/55 (!) 144/63  127/76  Pulse: 73 79  74  Resp:  19  17  Temp:  98 F (36.7 C)  98.1 F (36.7 C)  TempSrc:      SpO2:  97% 95% (!) 81%  Weight:        Intake/Output Summary (Last 24 hours) at 10/27/2020 0820 Last data filed at 10/26/2020 1902 Gross per 24 hour  Intake 487.15 ml  Output 750 ml  Net -262.85 ml    Filed Weights   10/16/20 1534  Weight: 103 kg    Examination:  General.  Lethargic appearing gentleman, in no acute distress. Pulmonary.  Lungs clear bilaterally, normal respiratory effort. CV.  Regular rate and rhythm, no JVD, rub or murmur. Abdomen.  Soft, nontender, nondistended, BS positive. CNS.  Alert and oriented .  No focal neurologic deficit. Extremities.   No edema, no cyanosis, pulses intact and symmetrical. Psychiatry.  Judgment and insight appears normal.   DVT prophylaxis: SCDs, holding Eliquis due to GI bleed Code Status: Full Family Communication:  Son was updated on phone. Disposition Plan:  Status is: Inpatient  Remains inpatient appropriate because:Inpatient level of care appropriate due to severity of illness  Dispo: The patient is from: SNF              Anticipated d/c is to: SNF              Patient currently is medically stable to d/c.   Difficult to place patient No               Level of care: Med-Surg  All the records are reviewed and case discussed with Care Management/Social Worker. Management plans discussed with the patient, nursing and they are in agreement.  Consultants:  Infectious disease Neurosurgery  Procedures:  Antimicrobials:  Cefazolin  Data Reviewed: I have personally reviewed following labs and imaging studies  CBC: Recent Labs  Lab 10/21/20 0545 10/25/20 1703 10/26/20 0450 10/27/20 0604  WBC 9.9 12.3* 13.0* 10.3  HGB 11.4* 9.9* 9.2* 8.5*  HCT 35.2* 30.5* 27.6* 25.6*  MCV 85.9 87.1 87.3 87.7  PLT 311 393 367 336    Basic Metabolic Panel:  Recent Labs  Lab 10/21/20 0545 10/22/20 0527 10/23/20 0642 10/26/20 0450 10/27/20 0604  NA 136 138 137 134* 135  K 4.9 4.4 4.5 4.4 4.1  CL 106 105 103 97* 99  CO2 23 25 28 30  32  GLUCOSE 228* 131* 140* 146* 128*  BUN 46* 35* 26* 24* 18  CREATININE 0.96 0.85 0.75 0.86 0.71  CALCIUM 8.6* 8.4* 8.5* 8.3* 8.2*    GFR: CrCl cannot be calculated (Unknown ideal weight.). Liver Function Tests: No results for input(s): AST, ALT, ALKPHOS, BILITOT, PROT, ALBUMIN in the last 168 hours. No results for input(s): LIPASE, AMYLASE in the last 168 hours. No results for input(s): AMMONIA in the last 168 hours. Coagulation Profile: Recent Labs  Lab 10/26/20 0900  INR 1.4*    Cardiac Enzymes: No results for input(s): CKTOTAL, CKMB, CKMBINDEX,  TROPONINI in the last 168 hours. BNP (last 3 results) No results for input(s): PROBNP in the last 8760 hours. HbA1C: No results for input(s): HGBA1C in the last 72 hours.  CBG: Recent Labs  Lab 10/26/20 0901 10/26/20 1247 10/26/20 1831 10/26/20 2320 10/27/20 0729  GLUCAP 192* 184* 152* 132* 115*    Lipid Profile: No results for input(s): CHOL, HDL, LDLCALC, TRIG, CHOLHDL, LDLDIRECT in the last 72 hours. Thyroid Function Tests: No results for input(s): TSH, T4TOTAL, FREET4, T3FREE, THYROIDAB in the last 72 hours. Anemia Panel: No results for input(s): VITAMINB12, FOLATE, FERRITIN, TIBC, IRON, RETICCTPCT in the last 72 hours. Sepsis Labs: No results for input(s): PROCALCITON, LATICACIDVEN in the last 168 hours.  Recent Results (from the past 240 hour(s))  CULTURE, BLOOD (ROUTINE X 2) w Reflex to ID Panel     Status: None   Collection Time: 10/19/20  1:11 AM   Specimen: BLOOD RIGHT FOREARM  Result Value Ref Range Status   Specimen Description BLOOD RIGHT FOREARM  Final   Special Requests   Final    BOTTLES DRAWN AEROBIC AND ANAEROBIC Blood Culture results may not be optimal due to an excessive volume of blood received in culture bottles   Culture   Final    NO GROWTH 5 DAYS Performed at [facility], [address] [place], [place]    Report Status 10/24/2020 FINAL  Final  CULTURE, BLOOD (ROUTINE X 2) w Reflex to ID Panel     Status: None   Collection Time: 10/19/20  1:17 AM   Specimen: BLOOD RIGHT HAND  Result Value Ref Range Status   Specimen Description BLOOD RIGHT HAND  Final   Special Requests   Final    BOTTLES DRAWN AEROBIC AND ANAEROBIC Blood Culture adequate volume   Culture   Final    NO GROWTH 5 DAYS Performed at [facility], [address], [place], [place]    Report Status 10/24/2020 FINAL  Final  SARS CORONAVIRUS 2 (TAT 6-24 HRS) Nasopharyngeal Nasopharyngeal Swab     Status: None   Collection Time: 10/25/20  11:00 AM   Specimen: Nasopharyngeal Swab  Result Value Ref Range Status   SARS Coronavirus 2 NEGATIVE NEGATIVE Final    Comment: (NOTE) SARS-CoV-2 target nucleic acids are NOT DETECTED.  The SARS-CoV-2 RNA is generally detectable in upper and lower respiratory specimens during the acute phase of infection. Negative results do not preclude SARS-CoV-2 infection, do not rule out co-infections with other pathogens, and should not be used as the sole basis for treatment or other patient management decisions. Negative results must be combined with clinical observations, patient history,  and epidemiological information. The expected result is Negative.  Fact Sheet for Patients: HairSlick.no  Fact Sheet for Healthcare Providers: quierodirigir.com  This test is not yet approved or cleared by the [place] FDA and  has been authorized for detection and/or diagnosis of SARS-CoV-2 by FDA under an Emergency Use Authorization (EUA). This EUA will remain  in effect (meaning this test can be used) for the duration of the COVID-19 declaration under Se ction 564(b)(1) of the Act, 21 U.S.C. section 360bbb-3(b)(1), unless the authorization is terminated or revoked sooner.  Performed at [facility], [address] [place], [place] [address]       Radiology Studies: CT CERVICAL SPINE W CONTRAST  Result Date: 10/26/2020 CLINICAL DATA:  Neck pain, acute, infection suspected. Patient with Gp C strep bactremia, neck hardware with worsening pain. EXAM: CT CERVICAL SPINE WITH CONTRAST TECHNIQUE: Multidetector CT imaging of the cervical spine was performed during intravenous contrast administration. Multiplanar CT image reconstructions were also generated. CONTRAST:  54mL OMNIPAQUE IOHEXOL 350 MG/ML SOLN COMPARISON:  10/18/2020 FINDINGS: Alignment: Unchanged cervical spine straightening. No significant listhesis. Skull base and vertebrae:  Postsurgical changes from C4-5 corpectomy, C3-C6 anterior fusion, and C3-C7 posterior fusion and laminectomies as previously detailed. Remote, solid interbody fusion at C6-7. Advanced C1-2 arthropathy with unchanged mild basilar impression. No interval osseous destruction to suggest osteomyelitis. No acute fracture. Soft tissues and spinal canal: Limited assessment of the spinal canal due to streak artifact. No gross prevertebral fluid or significant prevertebral soft tissue swelling. Disc levels: Unchanged appearance of postoperative and degenerative changes in the cervical and included upper thoracic spine with multilevel neural foraminal stenosis as detailed on the recent prior CT. Upper chest: Clear lung apices. Other: Extensive motion artifact through the pharynx and larynx. IMPRESSION: No acute osseous abnormality identified in the cervical spine. Limited assessment of the spinal canal due to artifact. MRI would provide much better assessment for epidural abscess or early changes of discitis-osteomyelitis. Electronically Signed   By: Sebastian Ache M.D.   On: 10/26/2020 14:06   CT KNEE RIGHT W CONTRAST  Result Date: 10/26/2020 CLINICAL DATA:  Right knee pain and spasms. Diabetic patient with streptococcal bacteremia and sepsis. EXAM: CT OF THE RIGHT KNEE WITH CONTRAST TECHNIQUE: Multidetector CT imaging was performed following the standard protocol during bolus administration of intravenous contrast. CONTRAST:  75 mL OMNIPAQUE IOHEXOL 350 MG/ML SOLN COMPARISON:  None. FINDINGS: Bones/Joint/Cartilage The exam is degraded by patient motion despite repeat imaging. No fracture or focal bone lesion is seen. No joint effusion is identified. Chondrocalcinosis of the menisci is noted. Ligaments Suboptimally assessed by CT. Muscles and Tendons Grossly intact. Soft tissues There is some subcutaneous edema about the knee and visualized lower leg, worse on the lower leg. No focal fluid collection. No soft tissue gas. No  radiopaque foreign body. IMPRESSION: Motion degraded exam. No acute bony or joint abnormality is identified. Subcutaneous edema about the knee and lower leg could be due to dependent change or cellulitis. No focal fluid collection is seen. Electronically Signed   By: Drusilla Kanner M.D.   On: 10/26/2020 15:37   MR CERVICAL SPINE W WO CONTRAST  Result Date: 10/27/2020 CLINICAL DATA:  Initial evaluation for neck pain in the setting of sepsis and bacteremia, myelopathy. History of prior fusion. EXAM: MRI CERVICAL SPINE WITHOUT AND WITH CONTRAST TECHNIQUE: Multiplanar and multiecho pulse sequences of the cervical spine, to include the craniocervical junction and cervicothoracic junction, were obtained without and with intravenous contrast. CONTRAST:  10mL GADAVIST GADOBUTROL 1 MMOL/ML IV SOLN COMPARISON:  Prior CT from earlier the same day. FINDINGS: Alignment: Straightening of the normal cervical lordosis, stable. Trace chronic anterolisthesis of C7 on T1. Vertebrae: Susceptibility artifact from prior C4-5 corpectomy with C3-C6 anterior fusion, and C3-C7 posterior fusion. Hardware better evaluated on prior CT. Vertebral body height maintained without acute or chronic fracture. Underlying bone marrow signal intensity within normal limits. No worrisome osseous lesions. 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 Associated mild reactive marrow edema involving the right lateral masses of C1 and C2 as well as the dens and anterior arch of C1. There is prominent soft tissue thickening with prevertebral edema and enhancement extending from the inferior aspect of the clivus to the C2-3 level (series 16, image 9) finding is nonspecific, but raises the possibility for skull base infection given the history of sepsis and bacteremia. Possible tiny 5 mm collection at this level (series 16, image 7). No other evidence for osteomyelitis discitis or septic arthritis within  the cervical spine. Cord: Normal signal and morphology. No epidural abscess or other collection. Posterior Fossa, vertebral arteries, paraspinal tissues: Visualized brain and posterior fossa within normal limits. Prominent degenerative thickening at the dens/tectorial membrane with associated moderate stenosis at the cervicomedullary junction. Prevertebral edema and enhancement extending from the clivus through the C2-3 level as above. Additional mild edema and enhancement involving the posterior suboccipital soft tissues at the level of the posterior arch of C1 as well. There is a superimposed curvilinear rim enhancing collection within this region, closely approximating the posterior arch of C1 (series 16, images 5-17). This is also seen on prior CT (series 5, image 18 of that exam). Collection measures up to approximately 2.8 cm in size on prior CT, difficult to measure on this exam given location and morphology. Finding is indeterminate, but could reflect abscess. Remainder of the visualized soft tissues demonstrate no other acute finding. Normal flow voids seen within the vertebral arteries bilaterally. Disc levels: C1-2: Advanced osteoarthritic changes about the C1-2 articulation with degenerative thickening about the dens and tectorial membrane. Superimposed ligamentum flavum hypertrophy. Resultant moderate spinal stenosis without frank cord impingement. C2-C3: Mild disc bulge with uncovertebral hypertrophy. Moderate left with mild right facet degeneration. Resultant moderate left with mild right C3 foraminal stenosis. C3-C4: Prior fusion with posterior decompression. No residual spinal stenosis. Residual mild to moderate left greater than right foraminal narrowing related to uncovertebral disease. C4-C5: Prior fusion with posterior decompression. No residual spinal stenosis. Residual mild to moderate bilateral foraminal narrowing related to residual uncovertebral and facet disease. C5-C6: Prior fusion with  posterior decompression. No residual spinal stenosis. Residual moderate bilateral foraminal narrowing related to uncovertebral hypertrophy. C6-C7: Prior fusion with posterior decompression. Left paracentral endplate osseous ridging indents the left ventral thecal sac. No significant spinal stenosis. Residual moderate bilateral foraminal narrowing. C7-T1: Trace anterolisthesis. Disc bulge with uncovertebral hypertrophy with advanced facet degeneration. No significant spinal stenosis. Moderate right worse than left C8 foraminal stenosis. Visualized upper thoracic spine demonstrates mild noncompressive disc bulging without significant stenosis. IMPRESSION: 1. Prominent soft tissue thickening with edema and enhancement involving the prevertebral soft tissues, extending from the clivus through the C2-3 level. Finding is nonspecific, but raises the possibility for acute skull base infection given the history of sepsis and bacteremia. Question tiny superimposed 5 mm collection as above. Correlation with symptomatology and laboratory values recommended. 2. Mild edema and enhancement involving the posterior suboccipital soft tissues with superimposed collection tracking along  the posterior arch of C1, nonspecific, but could also reflect acute infection. This collection is more easily seen on prior CT (series 5, image 18 of that examination). 3. No other evidence for acute infection within the cervical spine. 4. 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 5. Prior C4-5 corpectomy with C3-C6 anterior fusion, with C3-C7 posterior fusion and decompression. No residual spinal stenosis at these levels. Electronically Signed   By: Rise Mu M.D.   On: 10/27/2020 03:49    Scheduled Meds:  ascorbic acid  500 mg Oral Daily   benzonatate  100 mg Oral TID   budesonide (PULMICORT) nebulizer solution  0.5 mg Nebulization BID    carbidopa-levodopa  1 tablet Oral TID   Chlorhexidine Gluconate Cloth  6 each Topical Daily   diclofenac Sodium  4 g Topical QID   escitalopram  5 mg Oral Daily   feeding supplement  237 mL Oral BID BM   finasteride  5 mg Oral Daily   fluticasone  1 spray Each Nare Daily   furosemide  20 mg Oral Daily   gabapentin  800 mg Oral BID   guaiFENesin  600 mg Oral Q12H   insulin aspart  0-5 Units Subcutaneous QHS   insulin aspart  0-9 Units Subcutaneous TID WC   ipratropium  1 spray Each Nare BID   ipratropium-albuterol  3 mL Nebulization TID   lactase  3,000 Units Oral TID WC   lactulose  30 g Oral Daily   lidocaine  1 patch Transdermal Q24H   loratadine  10 mg Oral Daily   methocarbamol  500 mg Oral TID   metoprolol succinate  50 mg Oral Daily   montelukast  10 mg Oral QHS   sodium chloride flush  10-40 mL Intracatheter Q12H   tamsulosin  0.4 mg Oral Daily   traZODone  100 mg Oral QHS   Continuous Infusions:  sodium chloride      ceFAZolin (ANCEF) IV 2 g (10/27/20 0655)     LOS: 11 days   Time spent: 35 minutes. More than 50% of the time was spent in counseling/coordination of care  Arnetha Courser, MD [facility]  If 7PM-7AM, please contact night-coverage [email or web address]  10/27/2020, 8:20 AM   This record has been created using Conservation officer, historic buildings. Errors have been sought and corrected,but may not always be located. Such creation errors do not reflect on the standard of care.

## 2020-10-27 NOTE — H&P (Signed)
Wyline Mood, MD [address], [place], [place], [address] [address], [place], [place], [address] Phone: [phone]  Fax: [phone]  Primary Care Physician:  [facility]   Pre-Procedure History & Physical: HPI:  Raymond Hays is a 75 y.o. male is here for an endoscopy and colonoscopy    Past Medical History:  Diagnosis Date   Allergic rhinitis    Anxiety    Arthritis    Chronic indwelling Foley catheter    COPD (chronic obstructive pulmonary disease) (HCC)    Cough    Diabetes (HCC)    Essential (primary) hypertension    Hemorrhoid    Hyperlipidemia    PAF (paroxysmal atrial fibrillation) (HCC)    Primary osteoarthritis, unspecified shoulder    Retention of urine, unspecified    Spinal stenosis    UTI (urinary tract infection)     Past Surgical History:  Procedure Laterality Date   APPENDECTOMY     BACK SURGERY     ELBOW SURGERY     HERNIA REPAIR     KNEE SURGERY     neck     PARTIAL HIP ARTHROPLASTY Bilateral    TEE WITHOUT CARDIOVERSION N/A 10/19/2020   Procedure: TRANSESOPHAGEAL ECHOCARDIOGRAM (TEE);  Surgeon: Antonieta Iba, MD;  Location: [facility] ORS;  Service: Cardiovascular;  Laterality: N/A;   TONSILLECTOMY      Prior to Admission medications   Medication Sig Start Date End Date Taking? Authorizing Provider  acetaminophen (TYLENOL) 325 MG tablet Take 650 mg by mouth every 4 (four) hours as needed.   Yes [provider]  ANTI-DANDRUFF 1 % SHAM Apply topically. 02/18/20  Yes [provider]  apixaban (ELIQUIS) 5 MG TABS tablet Take 5 mg by mouth 2 (two) times daily.  02/06/17  Yes [provider]  atorvastatin (LIPITOR) 20 MG tablet Take 20 mg by mouth daily.   Yes [provider]  cetirizine (ZYRTEC) 10 MG tablet Take 10 mg by mouth daily.    Yes [provider]  diclofenac Sodium (VOLTAREN) 1 % GEL Apply 4 g topically 4 (four) times daily.   Yes [provider]   escitalopram (LEXAPRO) 5 MG tablet Take 5 mg by mouth daily.   Yes [provider]  finasteride (PROSCAR) 5 MG tablet Take 5 mg by mouth daily.   Yes [provider]  gabapentin (NEURONTIN) 800 MG tablet Take 800 mg by mouth 2 (two) times daily.    Yes [provider]  guaiFENesin (MUCINEX) 600 MG 12 hr tablet Take 600 mg by mouth every 12 (twelve) hours.   Yes [provider]  ipratropium (ATROVENT) 0.03 % nasal spray Place 2 sprays into both nostrils 2 (two) times daily. 02/18/20  Yes [provider]  lactase (LACTAID) 3000 units tablet Take 3,000 Units by mouth 3 (three) times daily with meals.   Yes [provider]  methocarbamol (ROBAXIN) 500 MG tablet Take 1,000 mg by mouth in the morning and at bedtime.    Yes [provider]  metoprolol succinate (TOPROL-XL) 50 MG 24 hr tablet Take 50 mg by mouth daily.    Yes [provider]  montelukast (SINGULAIR) 10 MG tablet Take 10 mg by mouth at bedtime.   Yes [provider]  nitrofurantoin, macrocrystal-monohydrate, (MACROBID) 100 MG capsule Take 100 mg by mouth at bedtime.   Yes [provider]  polyethylene glycol (MIRALAX / GLYCOLAX) 17 g packet Take 17 g by mouth  daily.   Yes [provider]  predniSONE (DELTASONE) 20 MG tablet Take 20 mg by mouth daily with breakfast.   Yes [provider]  simethicone (MYLICON) 125 MG chewable tablet Chew 125 mg by mouth 3 (three) times daily.   Yes [provider]  Skin Protectants, Misc. (MINERIN CREME EX) Apply topically. Apply to feet twice bid   Yes [provider]  tamsulosin (FLOMAX) 0.4 MG CAPS capsule Take 0.4 mg by mouth 2 (two) times daily.   Yes [provider]  torsemide (DEMADEX) 10 MG tablet Take 10 mg by mouth daily.   Yes [provider]  traMADol (ULTRAM) 50 MG tablet Take 100 mg by mouth in the morning.    Yes [provider]  traZODone  (DESYREL) 100 MG tablet Take 100 mg by mouth at bedtime.   Yes [provider]  TRELEGY ELLIPTA 100-62.5-25 MCG/INH AEPB Inhale 1 puff into the lungs daily. 02/17/20  Yes [provider]  triamcinolone cream (KENALOG) 0.1 % Apply 1 application topically 2 (two) times daily.   Yes [provider]  Albuterol Sulfate (PROAIR RESPICLICK) 108 (90 Base) MCG/ACT AEPB Inhale 2 puffs into the lungs every 4 (four) hours as needed (COPD).    [provider]  ascorbic acid (VITAMIN C) 500 MG tablet Take by mouth.  Patient not taking: No sig reported    [provider]  benzonatate (TESSALON) 100 MG capsule Take by mouth 2 (two) times daily as needed for cough. Patient not taking: No sig reported    [provider]  bisacodyl (DULCOLAX) 10 MG suppository Place 10 mg rectally as needed for moderate constipation.    [provider]  Budeson-Glycopyrrol-Formoterol (BREZTRI AEROSPHERE) 160-9-4.8 MCG/ACT AERO Inhale 2 puffs into the lungs in the morning and at bedtime. Patient not taking: No sig reported    [provider]  calcium carbonate (TUMS - DOSED IN MG ELEMENTAL CALCIUM) 500 MG chewable tablet Chew 2 tablets by mouth every 6 (six) hours as needed for indigestion or heartburn.    [provider]  carbidopa-levodopa (SINEMET IR) 25-100 MG tablet Take 1 tablet by mouth 3 (three) times daily before meals. At 0630, 1130, 1630    [provider]  carboxymethylcellulose (REFRESH PLUS) 0.5 % SOLN 1 drop as needed.    [provider]  clobetasol cream (TEMOVATE) 0.05 % Apply topically. 01/12/20   [provider]  diphenhydrAMINE-zinc acetate (BENADRYL ITCH STOPPING) cream Apply topically every 4 (four) hours.    [provider]  Docusate Sodium (STOOL SOFTENER LAXATIVE PO) Take 1 tablet by mouth in the morning and at bedtime.    [provider]  Emollient (CETAPHIL) cream Apply 1 application  topically as needed.    [provider]  Emollient (EUCERIN DAILY PROTECTION/SPF30) LOTN Apply topically as needed.    [provider]  fluticasone (FLONASE) 50 MCG/ACT nasal spray Place 1 spray into both nostrils daily.    [provider]  furosemide (LASIX) 20 MG tablet Take by mouth. Patient not taking: No sig reported 01/27/20   [provider]  hydrocortisone (ANUSOL-HC) 2.5 % rectal cream Place 1 application rectally 2 (two) times daily. 09/15/19   Toney ReilVanga, Rohini Reddy, MD  hydrocortisone cream 0.5 % Apply topically.    [provider]  hydrOXYzine (ATARAX/VISTARIL) 25 MG tablet Take 25 mg by mouth 2 (two) times daily as needed for itching. 02/17/20   [provider]  ibuprofen (ADVIL) 200 MG tablet  Take 200 mg by mouth 2 (two) times daily as needed (jaw pain).    [provider]  ipratropium (ATROVENT) 0.06 % nasal spray Place into the nose. Patient not taking: No sig reported    [provider]  ipratropium-albuterol (DUONEB) 0.5-2.5 (3) MG/3ML SOLN Inhale 3 mLs into the lungs daily. And every 6 hours as needed for shortness of breath or wheezing    [provider]  Lidocaine 3 % CREA Apply topically daily as needed.    [provider]  lisinopril (ZESTRIL) 2.5 MG tablet Take 2.5 mg by mouth daily. Patient not taking: No sig reported    [provider]  loperamide (IMODIUM) 2 MG capsule Take 4 mg by mouth as needed for diarrhea or loose stools.    [provider]  metFORMIN (GLUCOPHAGE-XR) 500 MG 24 hr tablet Take 500 mg by mouth daily.     [provider]  nystatin (MYCOSTATIN/NYSTOP) powder  10/21/18   [provider]  psyllium (METAMUCIL) 58.6 % powder Take 1 packet by mouth daily.    [provider]  SARNA lotion Apply topically. 02/25/20   [provider]  senna-docusate (SENOKOT-S) 8.6-50 MG tablet Take by mouth. 10/21/18   [provider]    Allergies as of 10/16/2020   (No Known Allergies)    Family History  Problem Relation Age of Onset   Healthy Son    Healthy Son     Social History   Socioeconomic History   Marital status: Divorced    Spouse name: Not on file   Number of children: Not on file   Years of education: Not on file   Highest education level: Not on file  Occupational History   Occupation: retired    Comment: retired Hotel manager x 4 years; Scientist, physiological; Arboriculturist as Academic librarian  Tobacco Use   Smoking status: Former    Packs/day: 0.50    Years: 10.00    Pack years: 5.00    Types: Cigarettes    Quit date: 2006    Years since quitting: 16.5   Smokeless tobacco: Never  Vaping Use   Vaping Use: Never used  Substance and Sexual Activity   Alcohol use: Not Currently   Drug use: Not Currently    Types: "Crack" cocaine    Comment: quit "crack" 15 years ago--11/04/2019   Sexual activity: Not on file  Other Topics Concern   Not on file  Social History Narrative   Not on file   Social Determinants of Health   Financial Resource Strain: Not on file  Food Insecurity: Not on file  Transportation Needs: Not on file  Physical Activity: Not on file  Stress: Not on file  Social Connections: Not on file  Intimate Partner Violence: Not on file    Review of Systems: See HPI, otherwise negative ROS  Physical Exam: BP 127/76 (BP Location: Left Arm)   Pulse 74   Temp 98.1 F (36.7 C)   Resp 17   Wt 103 kg   SpO2 (!) 81%   BMI 35.55 kg/m  General:   Alert,  pleasant and cooperative in NAD Head:  Normocephalic and atraumatic. Neck:  Supple; no masses or thyromegaly. Lungs:  Clear throughout to auscultation, normal respiratory effort.    Heart:  +S1, +S2, Regular rate and rhythm, No edema. Abdomen:  Soft, nontender and nondistended. Normal bowel sounds, without guarding, and without rebound.   Neurologic:  Alert and  oriented x4;  grossly normal  neurologically.  Impression/Plan: Raymond Hays  is here for an endoscopy and colonoscopy  to be performed for  evaluation of GI bleed. Initial plan was for a sigmoidoscopy but since he received a completely bowel prep will proceed with colonoscopy    Risks, benefits, limitations, and alternatives regarding endoscopy have been reviewed with the patient.  Questions have been answered.  All parties agreeable.   Wyline Mood, MD  10/27/2020, 11:57 AM

## 2020-10-27 NOTE — Anesthesia Preprocedure Evaluation (Addendum)
Anesthesia Evaluation  Patient identified by MRN, date of birth, ID band Patient awake    Airway Mallampati: II  TM Distance: >3 FB     Dental no notable dental hx.    Pulmonary COPD, former smoker,    Pulmonary exam normal - rhonchi (-) wheezing      Cardiovascular hypertension, Normal cardiovascular exam Rhythm:Regular Rate:Normal - Systolic murmurs and - Diastolic murmurs 0/93/81 TTE: IMPRESSIONS    1. No valve vegetation noted.  2. Left ventricular ejection fraction, by estimation, is 60 to 65%. The  left ventricle has normal function. The left ventricle has no regional  wall motion abnormalities.  3. Right ventricular systolic function is normal. The right ventricular  size is moderately enlarged.  4. Left atrial size was severely dilated. No left atrial/left atrial  appendage thrombus was detected.  5. The mitral valve is normal in structure. Mild to moderate mitral valve  regurgitation. No evidence of mitral stenosis. 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 Echo findings are consistent with normal  structure and function of the mitral  valve prosthesis.  6. Tricuspid valve regurgitation is moderate.  7. Evidence of atrial level shunting detected by color flow Doppler.  There is a small to moderately sized atrial septal defect.    Neuro/Psych Anxiety 10/26/20 Cervical MRI:  IMPRESSION: 1. Prominent soft tissue thickening with edema and enhancementinvolving the prevertebral soft tissues, extending from the clivusthrough the C2-3 level. Finding is nonspecific, but raises thepossibility for acute skull base infection given the history ofsepsis and bacteremia. Question tiny superimposed 5 mm collection as above. Correlatin with symptomatology and laboratory valuesrecommended. 2. Mild edema and enhancement involving the posterior suboccipitalsoft tissues with superimposed collection tracking along  theposterior arch of C1, nonspecific, but could also reflect acuteinfection. This collection is more easily seen on prior CT (series5, image 18 of that examination). 3. No other evidence for acute infection within the cervical spine. 4. Extensive osteoarthritic changes about the C1-2 articulation withassociated moderate spinal stenosis at the cervicomedullaryjunction. 5. Prior C4-5 corpectomy with C3-C6 anterior fusion, with C3-C7posterior fusion and decompression. No residual spinal stenosis atthese levels.    GI/Hepatic 10/23/20 Ct Abd/Pelvis: IMPRESSION: 1. No acute intra-abdominal or pelvic pathology. No bowel obstruction. 2. Sigmoid diverticulosis. 3. Cholelithiasis. 4. Aortic Atherosclerosis (ICD10-I70.0).   Endo/Other  diabetes  Renal/GU Renal disease     Musculoskeletal   Abdominal   Peds  Hematology + blood cultures this admission   Anesthesia Other Findings   Reproductive/Obstetrics                            Anesthesia Physical Anesthesia Plan  ASA: 4  Anesthesia Plan: MAC   Post-op Pain Management:    Induction: Intravenous  PONV Risk Score and Plan: 0 and TIVA and Propofol infusion  Airway Management Planned: Mask  Additional Equipment:   Intra-op Plan:   Post-operative Plan:   Informed Consent: I have reviewed the patients History and Physical, chart, labs and discussed the procedure including the risks, benefits and alternatives for the proposed anesthesia with the patient or authorized representative who has indicated his/her understanding and acceptance.     Dental advisory given  Plan Discussed with: CRNA and Anesthesiologist  Anesthesia Plan Comments:         Anesthesia Quick Evaluation

## 2020-10-27 NOTE — Transfer of Care (Signed)
Immediate Anesthesia Transfer of Care Note  Patient: Raymond Hays  Procedure(s) Performed: ESOPHAGOGASTRODUODENOSCOPY (EGD) WITH PROPOFOL COLONOSCOPY  Patient Location: Endoscopy Unit  Anesthesia Type:MAC  Level of Consciousness: drowsy  Airway & Oxygen Therapy: Patient Spontanous Breathing and Patient connected to nasal cannula oxygen  Post-op Assessment: Report given to RN and Post -op Vital signs reviewed and stable  Post vital signs: Reviewed and stable  Last Vitals:  Vitals Value Taken Time  BP 124/75 10/27/20 1235  Temp 36.7 C 10/27/20 1235  Pulse 103 10/27/20 1236  Resp 8 10/27/20 1236  SpO2 98 % 10/27/20 1236  Vitals shown include unvalidated device data.  Last Pain:  Vitals:   10/27/20 1235  TempSrc: Temporal  PainSc: Asleep      Patients Stated Pain Goal: 0 (91/63/84 6659)  Complications: No notable events documented.

## 2020-10-27 NOTE — Op Note (Signed)
[facility] Patient Name: Raymond Hays Procedure Date: 10/27/2020 12:05 PM MRN: [id number] Account #: [id number] Date of Birth: Nov 11, 1945 Admit Type: Inpatient Age: 75 Room: [facility] ENDO ROOM 4 Gender: Male Note Status: Finalized Procedure:             Colonoscopy Indications:           Melena Providers:             Wyline Mood MD, MD Referring MD:          [facility], MD (Referring MD) Medicines:             Monitored Anesthesia Care Complications:         No immediate complications. Procedure:             Pre-Anesthesia Assessment:                        - Prior to the procedure, a History and Physical was                         performed, and patient medications, allergies and                         sensitivities were reviewed. The patient's tolerance                         of previous anesthesia was reviewed.                        - The risks and benefits of the procedure and the                         sedation options and risks were discussed with the                         patient. All questions were answered and informed                         consent was obtained.                        - ASA Grade Assessment: III - A patient with severe                         systemic disease.                        After obtaining informed consent, the colonoscope was                         passed under direct vision. Throughout the procedure,                         the patient's blood pressure, pulse, and oxygen                         saturations were monitored continuously. The                         Colonoscope was introduced through the anus and  advanced to the the cecum, identified by the                         appendiceal orifice. The colonoscopy was performed                         without difficulty. The patient tolerated the                         procedure well. The quality of the bowel preparation                          was inadequate. Findings:      The perianal and digital rectal examinations were normal.      extensive amounts of semi-liquid stool was found in the entire colon Was       brown in color , no blood seen Impression:            - Preparation of the colon was inadequate.                        - Stool in the entire examined colon.                        - No specimens collected. Recommendation:        - Return patient to hospital ward for ongoing care.                        - Advance diet as tolerated.                        - Continue present medications.                        - 1. H pylori stool antigen                        2. PPI prilosec 40 mg BID for duodenal ulcer for 8                         weeks                        3. Colonoscopy showed no blood in the colon , brown                         stool, prep inadequate to detect polyps but shows no                         active bleeding                        GI will sign out Procedure Code(s):     --- Professional ---                        [phone], Colonoscopy, flexible; diagnostic, including                         collection of specimen(s) by brushing or washing, when  performed (separate procedure) Diagnosis Code(s):     --- Professional ---                        K92.1, Melena (includes Hematochezia) CPT copyright 2019 [employer]. All rights reserved. The codes documented in this report are preliminary and upon coder review may  be revised to meet current compliance requirements. Wyline Mood, MD Wyline Mood MD, MD 10/27/2020 12:34:05 PM This report has been signed electronically. Number of Addenda: 0 Note Initiated On: 10/27/2020 12:05 PM Scope Withdrawal Time: 0 hours 5 minutes 7 seconds  Total Procedure Duration: 0 hours 12 minutes 26 seconds  Estimated Blood Loss:  Estimated blood loss: none.      [facility]

## 2020-10-28 DIAGNOSIS — R7881 Bacteremia: Secondary | ICD-10-CM | POA: Diagnosis not present

## 2020-10-28 DIAGNOSIS — B955 Unspecified streptococcus as the cause of diseases classified elsewhere: Secondary | ICD-10-CM | POA: Diagnosis not present

## 2020-10-28 LAB — CBC
HCT: 25.6 % — ABNORMAL LOW (ref 39.0–52.0)
Hemoglobin: 8.3 g/dL — ABNORMAL LOW (ref 13.0–17.0)
MCH: 28.7 pg (ref 26.0–34.0)
MCHC: 32.4 g/dL (ref 30.0–36.0)
MCV: 88.6 fL (ref 80.0–100.0)
Platelets: 335 10*3/uL (ref 150–400)
RBC: 2.89 MIL/uL — ABNORMAL LOW (ref 4.22–5.81)
RDW: 15.6 % — ABNORMAL HIGH (ref 11.5–15.5)
WBC: 8.8 10*3/uL (ref 4.0–10.5)
nRBC: 0 % (ref 0.0–0.2)

## 2020-10-28 LAB — GLUCOSE, CAPILLARY
Glucose-Capillary: 111 mg/dL — ABNORMAL HIGH (ref 70–99)
Glucose-Capillary: 183 mg/dL — ABNORMAL HIGH (ref 70–99)
Glucose-Capillary: 228 mg/dL — ABNORMAL HIGH (ref 70–99)
Glucose-Capillary: 279 mg/dL — ABNORMAL HIGH (ref 70–99)

## 2020-10-28 LAB — MRSA NEXT GEN BY PCR, NASAL: MRSA by PCR Next Gen: DETECTED — AB

## 2020-10-28 MED ORDER — FUROSEMIDE 40 MG PO TABS
40.0000 mg | ORAL_TABLET | Freq: Every day | ORAL | Status: DC
  Administered 2020-10-29 – 2020-11-01 (×4): 40 mg via ORAL
  Filled 2020-10-28 (×4): qty 1

## 2020-10-28 MED ORDER — MUPIROCIN 2 % EX OINT
1.0000 "application " | TOPICAL_OINTMENT | Freq: Two times a day (BID) | CUTANEOUS | Status: DC
  Administered 2020-10-29 – 2020-11-01 (×7): 1 via NASAL
  Filled 2020-10-28: qty 22

## 2020-10-28 MED ORDER — HYDROMORPHONE HCL 1 MG/ML IJ SOLN
1.0000 mg | INTRAMUSCULAR | Status: DC | PRN
Start: 2020-10-28 — End: 2020-11-01
  Administered 2020-10-28 – 2020-11-01 (×10): 1 mg via INTRAVENOUS
  Filled 2020-10-28 (×10): qty 1

## 2020-10-28 MED ORDER — PREDNISONE 50 MG PO TABS
50.0000 mg | ORAL_TABLET | Freq: Every day | ORAL | Status: DC
  Administered 2020-10-29 – 2020-11-01 (×4): 50 mg via ORAL
  Filled 2020-10-28 (×4): qty 1

## 2020-10-28 MED ORDER — METHYLPREDNISOLONE SODIUM SUCC 125 MG IJ SOLR
60.0000 mg | Freq: Once | INTRAMUSCULAR | Status: AC
  Administered 2020-10-28: 60 mg via INTRAVENOUS
  Filled 2020-10-28: qty 2

## 2020-10-28 MED ORDER — CHLORHEXIDINE GLUCONATE CLOTH 2 % EX PADS: 6.0000 | MEDICATED_PAD | Freq: Every morning | CUTANEOUS | Status: DC

## 2020-10-28 NOTE — Progress Notes (Signed)
PROGRESS NOTE    Raymond Hays  MWN:[id number] DOB: 1946/02/05 DOA: 10/16/2020 PCP: [facility]   Brief Narrative: Taken from prior notes. Raymond Hays is a 75 y.o. Caucasian male with medical history significant for type 2 diabetes mellitus, dyslipidemia, hypertension, COPD, and anxiety and allergic rhinitis, who presented to the ER with acute onset of generalized weakness over the last 4 days at her SNF. Admitted with sepsis secondary to group C streptococcus bacteremia.  Started on Rocephin and will need 4 weeks of antibiotics till 11/13/2020.  TTE is without any concern of endocarditis.  Repeat blood cultures on 05/22/2020 remains negative. Patient had neck, right knee and right elbow pain.  Orthopedic surgery did not believe the right knee or elbow was septic.  CT cervical spine did not show any osteomyelitis but does have old hardware there.  The patient developed ileus during the hospital course.  Patient finally had a bowel movement on 10/24/2020 and diet advanced to solid food.  PICC line placed.    Patient had large maroon-colored bowel movement overnight, drop in hemoglobin about 2 g. GI was consulted and Eliquis was stopped-going for EGD and flexible sigmoidoscopy tomorrow.  Repeat cervical spine imaging due to worsening neck pain was without any significant changes or signs of infection.  Right knee CT was also without any significant abnormality.  Neurosurgery was consulted due to worsening neck pain and an MRI was done with and without contrast which shows some concern of edema/possible early infection at the base of skull and involving C1-C3, no intervention needed per neurosurgery at this time, might need little prolonged duration of antibiotics-we will defer that decision to infectious disease.  Subjective: Patient continues to experience neck pain, stating that Dilaudid does help for couple of hours.  Discussed about starting some steroid for few days and see if that will help  with his swelling at the base of the skull and improves the pain.  Patient agrees.  Assessment & Plan:   Active Problems:   AF (paroxysmal atrial fibrillation) (HCC)   Acute pain of right knee   COPD with acute exacerbation (HCC)   Sepsis (HCC)   Neck pain   AKI (acute kidney injury) (HCC)   Type 2 diabetes mellitus with diabetic neuropathy, without long-term current use of insulin (HCC)   Parkinson disease (HCC)   Ileus (HCC)  Sepsis secondary to group C streptococcal bacteremia.  Remained afebrile.  Continue to have neck pain.  Initial CT cervical spine done on 10/19/2020 was negative for any infection.  Patient do have hardware.  Repeat blood cultures remain negative. -ID was consulted-appreciate their recommendations. -Will need IV antibiotics till 11/13/2020, ID switched him to cefazolin and plan is to restart ceftriaxone on discharge for convenient dosing.  Might need little more prolonged outpatient antibiotics, will appreciate ID input regarding MRI findings.  No surgical intervention needed per neurosurgery.  Neck pain.  Initial cervical spine imaging was negative for any osteomyelitis.  Patient do have hardware.  Repeat CT cervical spine and right knee was again without any significant new abnormality or signs of infection.  Neurosurgery was also consulted and MRI was done with concern of some edema and possible signs of early infection involving C1-C3 and the base of skull. No need for any surgical intervention at this time. -Continue Dilaudid along with tramadol to see if that will help with his pain. -Add steroid for few days to see if that will help. -Continue Robaxin -Continue lidocaine patch  Ileus.  Resolved having multiple bowel movements. -Back of from bowel regimen. -Continue to monitor  Concern of melena.  FOBT positive.  Hemoglobin dropped to 8.3 this morning,  -GI was consulted-patient underwent EGD and sigmoidoscopy today, found to have a nonbleeding duodenal  ulcer, GI is recommending holding Eliquis for another 5 days and starting him on twice daily PPI for 8 weeks. -Holding Eliquis-for another 5 days as recommended by GI.  Will be resumed on 11/02/2020 -Protonix twice daily -Monitor hemoglobin -Transfuse if below 7  COPD exacerbation.  Resolved.  Completed a course of steroid. -Continue with breathing treatment and supportive care  Chronic suprapubic catheter.  Changed by urology on 10/23/2020.  Concern of urinary leakage through penis. -Continue changing monthly. -Outpatient urology follow-up  Right knee pain and right elbow pain.  No signs of septic joints as per orthopedic surgery.  -Right knee CT scan was without any significant new abnormality or infection. - Diclofenac gel ordered for right knee.  AKI.  Resolved  Parkinson's disease. -Continue Sinemet.  Paroxysmal atrial fibrillation. -Continue with metoprolol -Holding Eliquis for concern of GI bleed  Type 2 diabetes mellitus with neuropathy.  CBG improved after stopping steroid.  A1c of 6.6. -Continue with SSI -Continue with gabapentin  Essential hypertension.  Blood pressure within goal. -Continue with metoprolol  Generalized weakness. -PT is recommending SNF placement-pending bed availability and insurance authorization. -COVID test ordered-negative  Objective: Vitals:   10/28/20 0612 10/28/20 0707 10/28/20 0920 10/28/20 1234  BP: (!) 146/64  (!) 109/101 98/81  Pulse: 99  (!) 109 100  Resp: 19  18   Temp: 99.5 F (37.5 C)  98.1 F (36.7 C)   TempSrc:      SpO2: 96% 92% 100% 98%  Weight:        Intake/Output Summary (Last 24 hours) at 10/28/2020 1327 Last data filed at 10/28/2020 1026 Gross per 24 hour  Intake 240 ml  Output 2050 ml  Net -1810 ml    Filed Weights   10/16/20 1534  Weight: 103 kg    Examination:  General.  Chronically ill-appearing, obese gentleman, in no acute distress. Pulmonary.  Lungs clear bilaterally, normal respiratory  effort. CV.  Regular rate and rhythm, no JVD, rub or murmur. Abdomen.  Soft, nontender, nondistended, BS positive. CNS.  Alert and oriented x3.  No focal neurologic deficit. Extremities.  2+ LE edema, no cyanosis, pulses intact and symmetrical. Psychiatry.  Judgment and insight appears normal.  DVT prophylaxis: SCDs, holding Eliquis due to GI bleed. Code Status: Full Family Communication:  Son was updated on phone. Disposition Plan:  Status is: Inpatient  Remains inpatient appropriate because:Inpatient level of care appropriate due to severity of illness  Dispo: The patient is from: SNF              Anticipated d/c is to: SNF              Patient currently is medically stable to d/c.   Difficult to place patient No               Level of care: Med-Surg  All the records are reviewed and case discussed with Care Management/Social Worker. Management plans discussed with the patient, nursing and they are in agreement.  Consultants:  Infectious disease Neurosurgery  Procedures:  Antimicrobials:  Cefazolin  Data Reviewed: I have personally reviewed following labs and imaging studies  CBC: Recent Labs  Lab 10/25/20 1703 10/26/20 0450 10/27/20 0604 10/28/20 0500  WBC 12.3* 13.0* 10.3 8.8  HGB 9.9* 9.2* 8.5* 8.3*  HCT 30.5* 27.6* 25.6* 25.6*  MCV 87.1 87.3 87.7 88.6  PLT 393 367 336 335    Basic Metabolic Panel: Recent Labs  Lab 10/22/20 0527 10/23/20 0642 10/26/20 0450 10/27/20 0604  NA 138 137 134* 135  K 4.4 4.5 4.4 4.1  CL 105 103 97* 99  CO2 25 28 30  32  GLUCOSE 131* 140* 146* 128*  BUN 35* 26* 24* 18  CREATININE 0.85 0.75 0.86 0.71  CALCIUM 8.4* 8.5* 8.3* 8.2*    GFR: CrCl cannot be calculated (Unknown ideal weight.). Liver Function Tests: No results for input(s): AST, ALT, ALKPHOS, BILITOT, PROT, ALBUMIN in the last 168 hours. No results for input(s): LIPASE, AMYLASE in the last 168 hours. No results for input(s): AMMONIA in the last 168  hours. Coagulation Profile: Recent Labs  Lab 10/26/20 0900  INR 1.4*    Cardiac Enzymes: No results for input(s): CKTOTAL, CKMB, CKMBINDEX, TROPONINI in the last 168 hours. BNP (last 3 results) No results for input(s): PROBNP in the last 8760 hours. HbA1C: No results for input(s): HGBA1C in the last 72 hours.  CBG: Recent Labs  Lab 10/27/20 1138 10/27/20 1718 10/27/20 2128 10/28/20 0822 10/28/20 1131  GLUCAP 109* 201* 134* 111* 183*    Lipid Profile: No results for input(s): CHOL, HDL, LDLCALC, TRIG, CHOLHDL, LDLDIRECT in the last 72 hours. Thyroid Function Tests: No results for input(s): TSH, T4TOTAL, FREET4, T3FREE, THYROIDAB in the last 72 hours. Anemia Panel: Recent Labs    10/27/20 0908  VITAMINB12 355  FOLATE 8.4  FERRITIN 227  TIBC 244*  IRON 28*  RETICCTPCT 4.0*   Sepsis Labs: No results for input(s): PROCALCITON, LATICACIDVEN in the last 168 hours.  Recent Results (from the past 240 hour(s))  CULTURE, BLOOD (ROUTINE X 2) w Reflex to ID Panel     Status: None   Collection Time: 10/19/20  1:11 AM   Specimen: BLOOD RIGHT FOREARM  Result Value Ref Range Status   Specimen Description BLOOD RIGHT FOREARM  Final   Special Requests   Final    BOTTLES DRAWN AEROBIC AND ANAEROBIC Blood Culture results may not be optimal due to an excessive volume of blood received in culture bottles   Culture   Final    NO GROWTH 5 DAYS Performed at [facility], [address] [place], [place]    Report Status 10/24/2020 FINAL  Final  CULTURE, BLOOD (ROUTINE X 2) w Reflex to ID Panel     Status: None   Collection Time: 10/19/20  1:17 AM   Specimen: BLOOD RIGHT HAND  Result Value Ref Range Status   Specimen Description BLOOD RIGHT HAND  Final   Special Requests   Final    BOTTLES DRAWN AEROBIC AND ANAEROBIC Blood Culture adequate volume   Culture   Final    NO GROWTH 5 DAYS Performed at [facility], [address] [place], [place]  [place]    Report Status 10/24/2020 FINAL  Final  SARS CORONAVIRUS 2 (TAT 6-24 HRS) Nasopharyngeal Nasopharyngeal Swab     Status: None   Collection Time: 10/25/20 11:00 AM   Specimen: Nasopharyngeal Swab  Result Value Ref Range Status   SARS Coronavirus 2 NEGATIVE NEGATIVE Final    Comment: (NOTE) SARS-CoV-2 target nucleic acids are NOT DETECTED.  The SARS-CoV-2 RNA is generally detectable in upper and lower respiratory specimens during the acute phase of infection. Negative results do not preclude SARS-CoV-2 infection, do not rule  out co-infections with other pathogens, and should not be used as the sole basis for treatment or other patient management decisions. Negative results must be combined with clinical observations, patient history, and epidemiological information. The expected result is Negative.  Fact Sheet for Patients: HairSlick.no  Fact Sheet for Healthcare Providers: quierodirigir.com  This test is not yet approved or cleared by the [place] FDA and  has been authorized for detection and/or diagnosis of SARS-CoV-2 by FDA under an Emergency Use Authorization (EUA). This EUA will remain  in effect (meaning this test can be used) for the duration of the COVID-19 declaration under Se ction 564(b)(1) of the Act, 21 U.S.C. section 360bbb-3(b)(1), unless the authorization is terminated or revoked sooner.  Performed at [facility], [address], [place], [place] [address]   KOH prep     Status: None   Collection Time: 10/27/20 12:12 PM   Specimen: Esophagus  Result Value Ref Range Status   Specimen Description ESOPHAGUS  Final   Special Requests Normal  Final   KOH Prep   Final    YEAST WITH PSEUDOHYPHAE Performed at [facility], [address], [place], [place] [address]    Report Status 10/27/2020 FINAL  Final      Radiology Studies: MR CERVICAL SPINE W WO CONTRAST  Result Date:  10/27/2020 CLINICAL DATA:  Initial evaluation for neck pain in the setting of sepsis and bacteremia, myelopathy. History of prior fusion. EXAM: MRI CERVICAL SPINE WITHOUT AND WITH CONTRAST TECHNIQUE: Multiplanar and multiecho pulse sequences of the cervical spine, to include the craniocervical junction and cervicothoracic junction, were obtained without and with intravenous contrast. CONTRAST:  10mL GADAVIST GADOBUTROL 1 MMOL/ML IV SOLN COMPARISON:  Prior CT from earlier the same day. FINDINGS: Alignment: Straightening of the normal cervical lordosis, stable. Trace chronic anterolisthesis of C7 on T1. Vertebrae: Susceptibility artifact from prior C4-5 corpectomy with C3-C6 anterior fusion, and C3-C7 posterior fusion. Hardware better evaluated on prior CT. Vertebral body height maintained without acute or chronic fracture. Underlying bone marrow signal intensity within normal limits. No worrisome osseous lesions. 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 Associated mild reactive marrow edema involving the right lateral masses of C1 and C2 as well as the dens and anterior arch of C1. There is prominent soft tissue thickening with prevertebral edema and enhancement extending from the inferior aspect of the clivus to the C2-3 level (series 16, image 9) finding is nonspecific, but raises the possibility for skull base infection given the history of sepsis and bacteremia. Possible tiny 5 mm collection at this level (series 16, image 7). No other evidence for osteomyelitis discitis or septic arthritis within the cervical spine. Cord: Normal signal and morphology. No epidural abscess or other collection. Posterior Fossa, vertebral arteries, paraspinal tissues: Visualized brain and posterior fossa within normal limits. Prominent degenerative thickening at the dens/tectorial membrane with associated moderate stenosis at the cervicomedullary junction. Prevertebral edema  and enhancement extending from the clivus through the C2-3 level as above. Additional mild edema and enhancement involving the posterior suboccipital soft tissues at the level of the posterior arch of C1 as well. There is a superimposed curvilinear rim enhancing collection within this region, closely approximating the posterior arch of C1 (series 16, images 5-17). This is also seen on prior CT (series 5, image 18 of that exam). Collection measures up to approximately 2.8 cm in size on prior CT, difficult to measure on this exam given location  and morphology. Finding is indeterminate, but could reflect abscess. Remainder of the visualized soft tissues demonstrate no other acute finding. Normal flow voids seen within the vertebral arteries bilaterally. Disc levels: C1-2: Advanced osteoarthritic changes about the C1-2 articulation with degenerative thickening about the dens and tectorial membrane. Superimposed ligamentum flavum hypertrophy. Resultant moderate spinal stenosis without frank cord impingement. C2-C3: Mild disc bulge with uncovertebral hypertrophy. Moderate left with mild right facet degeneration. Resultant moderate left with mild right C3 foraminal stenosis. C3-C4: Prior fusion with posterior decompression. No residual spinal stenosis. Residual mild to moderate left greater than right foraminal narrowing related to uncovertebral disease. C4-C5: Prior fusion with posterior decompression. No residual spinal stenosis. Residual mild to moderate bilateral foraminal narrowing related to residual uncovertebral and facet disease. C5-C6: Prior fusion with posterior decompression. No residual spinal stenosis. Residual moderate bilateral foraminal narrowing related to uncovertebral hypertrophy. C6-C7: Prior fusion with posterior decompression. Left paracentral endplate osseous ridging indents the left ventral thecal sac. No significant spinal stenosis. Residual moderate bilateral foraminal narrowing. C7-T1: Trace  anterolisthesis. Disc bulge with uncovertebral hypertrophy with advanced facet degeneration. No significant spinal stenosis. Moderate right worse than left C8 foraminal stenosis. Visualized upper thoracic spine demonstrates mild noncompressive disc bulging without significant stenosis. IMPRESSION: 1. Prominent soft tissue thickening with edema and enhancement involving the prevertebral soft tissues, extending from the clivus through the C2-3 level. Finding is nonspecific, but raises the possibility for acute skull base infection given the history of sepsis and bacteremia. Question tiny superimposed 5 mm collection as above. Correlation with symptomatology and laboratory values recommended. 2. Mild edema and enhancement involving the posterior suboccipital soft tissues with superimposed collection tracking along the posterior arch of C1, nonspecific, but could also reflect acute infection. This collection is more easily seen on prior CT (series 5, image 18 of that examination). 3. No other evidence for acute infection within the cervical spine. 4. 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 5. Prior C4-5 corpectomy with C3-C6 anterior fusion, with C3-C7 posterior fusion and decompression. No residual spinal stenosis at these levels. Electronically Signed   By: Rise MuBenjamin  McClintock M.D.   On: 10/27/2020 03:49    Scheduled Meds:  ascorbic acid  500 mg Oral Daily   benzonatate  100 mg Oral TID   budesonide (PULMICORT) nebulizer solution  0.5 mg Nebulization BID   carbidopa-levodopa  1 tablet Oral TID   Chlorhexidine Gluconate Cloth  6 each Topical Daily   diclofenac Sodium  4 g Topical QID   escitalopram  5 mg Oral Daily   feeding supplement  237 mL Oral BID BM   ferrous sulfate  325 mg Oral BID WC   finasteride  5 mg Oral Daily   fluticasone  1 spray Each Nare Daily   furosemide  20 mg Oral Daily   gabapentin  800  mg Oral BID   guaiFENesin  600 mg Oral Q12H   insulin aspart  0-5 Units Subcutaneous QHS   insulin aspart  0-9 Units Subcutaneous TID WC   ipratropium  1 spray Each Nare BID   ipratropium-albuterol  3 mL Nebulization TID   lactase  3,000 Units Oral TID WC   lactulose  30 g Oral Daily   lidocaine  1 patch Transdermal Q24H   loratadine  10 mg Oral Daily   methocarbamol  500 mg Oral TID   metoprolol succinate  50 mg Oral Daily   montelukast  10 mg Oral QHS  pantoprazole  40 mg Oral BID AC   [START ON 10/29/2020] predniSONE  50 mg Oral Q breakfast   sodium chloride flush  10-40 mL Intracatheter Q12H   tamsulosin  0.4 mg Oral Daily   traZODone  100 mg Oral QHS   Continuous Infusions:  sodium chloride 10 mL/hr at 10/27/20 1531    ceFAZolin (ANCEF) IV 2 g (10/28/20 1022)     LOS: 12 days   Time spent: 35 minutes. More than 50% of the time was spent in counseling/coordination of care  Arnetha Courser, MD [facility]  If 7PM-7AM, please contact night-coverage [email or web address]  10/28/2020, 1:27 PM   This record has been created using Conservation officer, historic buildings. Errors have been sought and corrected,but may not always be located. Such creation errors do not reflect on the standard of care.

## 2020-10-29 DIAGNOSIS — B955 Unspecified streptococcus as the cause of diseases classified elsewhere: Secondary | ICD-10-CM | POA: Diagnosis not present

## 2020-10-29 DIAGNOSIS — R7881 Bacteremia: Secondary | ICD-10-CM | POA: Diagnosis not present

## 2020-10-29 LAB — BASIC METABOLIC PANEL
Anion gap: 5 (ref 5–15)
BUN: 18 mg/dL (ref 8–23)
CO2: 30 mmol/L (ref 22–32)
Calcium: 8.7 mg/dL — ABNORMAL LOW (ref 8.9–10.3)
Chloride: 100 mmol/L (ref 98–111)
Creatinine, Ser: 0.82 mg/dL (ref 0.61–1.24)
GFR, Estimated: >60 mL/min (ref >60)
Glucose, Bld: 192 mg/dL — ABNORMAL HIGH (ref 70–99)
Potassium: 4.4 mmol/L (ref 3.5–5.1)
Sodium: 135 mmol/L (ref 135–145)

## 2020-10-29 LAB — GLUCOSE, CAPILLARY
Glucose-Capillary: 189 mg/dL — ABNORMAL HIGH (ref 70–99)
Glucose-Capillary: 196 mg/dL — ABNORMAL HIGH (ref 70–99)
Glucose-Capillary: 280 mg/dL — ABNORMAL HIGH (ref 70–99)
Glucose-Capillary: 298 mg/dL — ABNORMAL HIGH (ref 70–99)

## 2020-10-29 MED ORDER — INSULIN ASPART 100 UNIT/ML IJ SOLN
0.0000 [IU] | Freq: Three times a day (TID) | INTRAMUSCULAR | Status: DC
  Administered 2020-10-29 (×2): 4 [IU] via SUBCUTANEOUS
  Administered 2020-10-29 – 2020-10-30 (×2): 11 [IU] via SUBCUTANEOUS
  Administered 2020-10-30: 3 [IU] via SUBCUTANEOUS
  Administered 2020-10-30 – 2020-10-31 (×2): 11 [IU] via SUBCUTANEOUS
  Administered 2020-10-31: 15 [IU] via SUBCUTANEOUS
  Administered 2020-11-01: 11 [IU] via SUBCUTANEOUS
  Filled 2020-10-29 (×9): qty 1

## 2020-10-29 NOTE — Progress Notes (Addendum)
Spoke with Dr. Nelson Chimes at bedside and made MD aware that patient is urinating from his penis but that suprapubic catheter is also draining urine. Patient voiced concerns to MD as well. No new orders given at this time except to place ted hose to BLE. Secure chat sent to Dr. Lonna Cobb and Dr. Nelson Chimes added to chat as well.

## 2020-10-29 NOTE — Progress Notes (Signed)
PROGRESS NOTE    Raymond Hays  TMA:[id number] DOB: 09/13/1945 DOA: 10/16/2020 PCP: [facility]   Brief Narrative: Taken from prior notes. Raymond Hays is a 75 y.o. Caucasian male with medical history significant for type 2 diabetes mellitus, dyslipidemia, hypertension, COPD, and anxiety and allergic rhinitis, who presented to the ER with acute onset of generalized weakness over the last 4 days at her SNF. Admitted with sepsis secondary to group C streptococcus bacteremia.  Started on Rocephin and will need 4 weeks of antibiotics till 11/13/2020.  TTE is without any concern of endocarditis.  Repeat blood cultures on 05/22/2020 remains negative. Patient had neck, right knee and right elbow pain.  Orthopedic surgery did not believe the right knee or elbow was septic.  CT cervical spine did not show any osteomyelitis but does have old hardware there.  The patient developed ileus during the hospital course.  Patient finally had a bowel movement on 10/24/2020 and diet advanced to solid food.  PICC line placed.    Patient had large maroon-colored bowel movement overnight, drop in hemoglobin about 2 g. GI was consulted and Eliquis was stopped-going for EGD and flexible sigmoidoscopy tomorrow.  Repeat cervical spine imaging due to worsening neck pain was without any significant changes or signs of infection.  Right knee CT was also without any significant abnormality.  Neurosurgery was consulted due to worsening neck pain and an MRI was done with and without contrast which shows some concern of edema/possible early infection at the base of skull and involving C1-C3, no intervention needed per neurosurgery at this time, might need little prolonged duration of antibiotics-we will defer that decision to infectious disease.  Subjective: Neck pain seems improving with current pain management and steroid.  Patient seems little happier as he was able to move neck from side to side.  Continue to have pain with  more movement. He was concerned about urine leakage from penis.  Suprapubic catheter with no leakage and draining well.  Assessment & Plan:   Active Problems:   AF (paroxysmal atrial fibrillation) (HCC)   Acute pain of right knee   COPD with acute exacerbation (HCC)   Sepsis (HCC)   Neck pain   AKI (acute kidney injury) (HCC)   Type 2 diabetes mellitus with diabetic neuropathy, without long-term current use of insulin (HCC)   Parkinson disease (HCC)   Ileus (HCC)  Sepsis secondary to group C streptococcal bacteremia.  Remained afebrile.  Continue to have neck pain.  Initial CT cervical spine done on 10/19/2020 was negative for any infection.  Patient do have hardware.  Repeat blood cultures remain negative. -ID was consulted-appreciate their recommendations. -Will need IV antibiotics till 11/13/2020, ID switched him to cefazolin and plan is to restart ceftriaxone on discharge for convenient dosing.  Might need little more prolonged outpatient antibiotics, will appreciate ID input regarding MRI findings.  No surgical intervention needed per neurosurgery.  Neck pain.  Initial cervical spine imaging was negative for any osteomyelitis.  Patient do have hardware.  Repeat CT cervical spine and right knee was again without any significant new abnormality or signs of infection.  Neurosurgery was also consulted and MRI was done with concern of some edema and possible signs of early infection involving C1-C3 and the base of skull. No need for any surgical intervention at this time. -Continue Dilaudid along with tramadol. -Continue steroid for few more days. -Continue Robaxin -Continue lidocaine patch -Continue with PT  Ileus.  Resolved having multiple bowel movements. -Back  of from bowel regimen. -Continue to monitor  Concern of melena.  FOBT positive.  -GI was consulted-patient underwent EGD and sigmoidoscopy , found to have a nonbleeding duodenal ulcer, GI is recommending holding Eliquis for  another 5 days and starting him on twice daily PPI for 8 weeks. -Holding Eliquis-for another 5 days as recommended by GI.  Will be resumed on 11/02/2020 -Protonix twice daily -Monitor hemoglobin -Transfuse if below 7  COPD exacerbation.  Resolved.  Completed a course of steroid. -Continue with breathing treatment and supportive care  Chronic suprapubic catheter.  Changed by urology on 10/23/2020.  Concern of urinary leakage through penis. -Another message sent to urology. -Continue changing monthly. -Outpatient urology follow-up  Right knee pain and right elbow pain.  No signs of septic joints as per orthopedic surgery.  -Right knee CT scan was without any significant new abnormality or infection. - Diclofenac gel ordered for right knee.  AKI.  Resolved  Parkinson's disease. -Continue Sinemet.  Paroxysmal atrial fibrillation. -Continue with metoprolol -Holding Eliquis for concern of GI bleed  Type 2 diabetes mellitus with neuropathy.  A1c of 6.6.  CBG trending up as patient was started on steroid. -Switch him to resistance/steroid SSI. -Continue with gabapentin  Essential hypertension.  Blood pressure within goal. -Continue with metoprolol  Generalized weakness. -PT is recommending SNF placement-pending bed availability and insurance authorization.  Objective: Vitals:   10/28/20 1418 10/28/20 1627 10/28/20 2032 10/29/20 0521  BP:  107/69 130/74 135/88  Pulse:  89 84 89  Resp:  16 17 19   Temp:  97.9 F (36.6 C) 98.2 F (36.8 C) 97.9 F (36.6 C)  TempSrc:  Oral    SpO2: 94% 96% 98% 96%  Weight:        Intake/Output Summary (Last 24 hours) at 10/29/2020 0741 Last data filed at 10/29/2020 0540 Gross per 24 hour  Intake 1419.04 ml  Output 2400 ml  Net -980.96 ml    Filed Weights   10/16/20 1534  Weight: 103 kg    Examination:  General.  Well-developed gentleman, in no acute distress. Pulmonary.  Lungs clear bilaterally, normal respiratory effort. CV.   Regular rate and rhythm, no JVD, rub or murmur. Abdomen.  Soft, nontender, nondistended, BS positive. CNS.  Alert and oriented x3.  No focal neurologic deficit. Extremities.  No edema, no cyanosis, pulses intact and symmetrical. Psychiatry.  Judgment and insight appears normal.   DVT prophylaxis: SCDs, holding Eliquis due to GI bleed. Code Status: Full Family Communication: Discussed with patient. Disposition Plan:  Status is: Inpatient  Remains inpatient appropriate because:Inpatient level of care appropriate due to severity of illness  Dispo: The patient is from: SNF              Anticipated d/c is to: SNF              Patient currently is medically stable to d/c.   Difficult to place patient No               Level of care: Med-Surg  All the records are reviewed and case discussed with Care Management/Social Worker. Management plans discussed with the patient, nursing and they are in agreement.  Consultants:  Infectious disease Neurosurgery  Procedures:  Antimicrobials:  Cefazolin  Data Reviewed: I have personally reviewed following labs and imaging studies  CBC: Recent Labs  Lab 10/25/20 1703 10/26/20 0450 10/27/20 0604 10/28/20 0500  WBC 12.3* 13.0* 10.3 8.8  HGB 9.9* 9.2* 8.5* 8.3*  HCT 30.5* 27.6*  25.6* 25.6*  MCV 87.1 87.3 87.7 88.6  PLT 393 367 336 335    Basic Metabolic Panel: Recent Labs  Lab 10/23/20 0642 10/26/20 0450 10/27/20 0604 10/29/20 0557  NA 137 134* 135 135  K 4.5 4.4 4.1 4.4  CL 103 97* 99 100  CO2 28 30 32 30  GLUCOSE 140* 146* 128* 192*  BUN 26* 24* 18 18  CREATININE 0.75 0.86 0.71 0.82  CALCIUM 8.5* 8.3* 8.2* 8.7*    GFR: CrCl cannot be calculated (Unknown ideal weight.). Liver Function Tests: No results for input(s): AST, ALT, ALKPHOS, BILITOT, PROT, ALBUMIN in the last 168 hours. No results for input(s): LIPASE, AMYLASE in the last 168 hours. No results for input(s): AMMONIA in the last 168 hours. Coagulation  Profile: Recent Labs  Lab 10/26/20 0900  INR 1.4*    Cardiac Enzymes: No results for input(s): CKTOTAL, CKMB, CKMBINDEX, TROPONINI in the last 168 hours. BNP (last 3 results) No results for input(s): PROBNP in the last 8760 hours. HbA1C: No results for input(s): HGBA1C in the last 72 hours.  CBG: Recent Labs  Lab 10/27/20 2128 10/28/20 0822 10/28/20 1131 10/28/20 1627 10/28/20 2057  GLUCAP 134* 111* 183* 228* 279*    Lipid Profile: No results for input(s): CHOL, HDL, LDLCALC, TRIG, CHOLHDL, LDLDIRECT in the last 72 hours. Thyroid Function Tests: No results for input(s): TSH, T4TOTAL, FREET4, T3FREE, THYROIDAB in the last 72 hours. Anemia Panel: Recent Labs    10/27/20 0908  VITAMINB12 355  FOLATE 8.4  FERRITIN 227  TIBC 244*  IRON 28*  RETICCTPCT 4.0*    Sepsis Labs: No results for input(s): PROCALCITON, LATICACIDVEN in the last 168 hours.  Recent Results (from the past 240 hour(s))  SARS CORONAVIRUS 2 (TAT 6-24 HRS) Nasopharyngeal Nasopharyngeal Swab     Status: None   Collection Time: 10/25/20 11:00 AM   Specimen: Nasopharyngeal Swab  Result Value Ref Range Status   SARS Coronavirus 2 NEGATIVE NEGATIVE Final    Comment: (NOTE) SARS-CoV-2 target nucleic acids are NOT DETECTED.  The SARS-CoV-2 RNA is generally detectable in upper and lower respiratory specimens during the acute phase of infection. Negative results do not preclude SARS-CoV-2 infection, do not rule out co-infections with other pathogens, and should not be used as the sole basis for treatment or other patient management decisions. Negative results must be combined with clinical observations, patient history, and epidemiological information. The expected result is Negative.  Fact Sheet for Patients: HairSlick.nohttps://www.fda.gov/media/138098/download  Fact Sheet for Healthcare Providers: quierodirigir.comhttps://www.fda.gov/media/138095/download  This test is not yet approved or cleared by the [place] FDA  and  has been authorized for detection and/or diagnosis of SARS-CoV-2 by FDA under an Emergency Use Authorization (EUA). This EUA will remain  in effect (meaning this test can be used) for the duration of the COVID-19 declaration under Se ction 564(b)(1) of the Act, 21 U.S.C. section 360bbb-3(b)(1), unless the authorization is terminated or revoked sooner.  Performed at [facility], [address] [place], [place] [address]   KOH prep     Status: None   Collection Time: 10/27/20 12:12 PM   Specimen: Esophagus  Result Value Ref Range Status   Specimen Description ESOPHAGUS  Final   Special Requests Normal  Final   KOH Prep   Final    YEAST WITH PSEUDOHYPHAE Performed at [facility], [address] [place], [place] [address]    Report Status 10/27/2020 FINAL  Final  MRSA Next Gen by PCR, Nasal  Status: Abnormal   Collection Time: 10/28/20  8:46 PM   Specimen: Nasal Mucosa; Nasal Swab  Result Value Ref Range Status   MRSA by PCR Next Gen DETECTED (A) NOT DETECTED Final    Comment: RESULT CALLED TO, READ BACK BY AND VERIFIED WITH: MARCELL TURNER @2215  10/28/2020 LFD (NOTE) The GeneXpert MRSA Assay (FDA approved for NASAL specimens only), is one component of a comprehensive MRSA colonization surveillance program. It is not intended to diagnose MRSA infection nor to guide or monitor treatment for MRSA infections. Test performance is not FDA approved in patients less than 85 years old. Performed at [facility], [address], [place], [place]       Radiology Studies: No results found.  Scheduled Meds:  ascorbic acid  500 mg Oral Daily   benzonatate  100 mg Oral TID   budesonide (PULMICORT) nebulizer solution  0.5 mg Nebulization BID   carbidopa-levodopa  1 tablet Oral TID   Chlorhexidine Gluconate Cloth  6 each Topical Daily   diclofenac Sodium  4 g Topical QID   escitalopram  5 mg Oral Daily   feeding supplement  237 mL Oral  BID BM   ferrous sulfate  325 mg Oral BID WC   finasteride  5 mg Oral Daily   fluticasone  1 spray Each Nare Daily   furosemide  40 mg Oral Daily   gabapentin  800 mg Oral BID   guaiFENesin  600 mg Oral Q12H   insulin aspart  0-20 Units Subcutaneous TID WC   insulin aspart  0-5 Units Subcutaneous QHS   ipratropium  1 spray Each Nare BID   ipratropium-albuterol  3 mL Nebulization TID   lactase  3,000 Units Oral TID WC   lactulose  30 g Oral Daily   lidocaine  1 patch Transdermal Q24H   loratadine  10 mg Oral Daily   methocarbamol  500 mg Oral TID   metoprolol succinate  50 mg Oral Daily   montelukast  10 mg Oral QHS   mupirocin ointment  1 application Nasal BID   pantoprazole  40 mg Oral BID AC   predniSONE  50 mg Oral Q breakfast   sodium chloride flush  10-40 mL Intracatheter Q12H   tamsulosin  0.4 mg Oral Daily   traZODone  100 mg Oral QHS   Continuous Infusions:  sodium chloride 10 mL/hr at 10/28/20 1715    ceFAZolin (ANCEF) IV 2 g (10/29/20 0600)     LOS: 13 days   Time spent: 35 minutes. More than 50% of the time was spent in counseling/coordination of care  10/31/20, MD [facility]  If 7PM-7AM, please contact night-coverage [email or web address]  10/29/2020, 7:41 AM   This record has been created using 10/31/2020. Errors have been sought and corrected,but may not always be located. Such creation errors do not reflect on the standard of care.

## 2020-10-30 DIAGNOSIS — R7881 Bacteremia: Secondary | ICD-10-CM | POA: Diagnosis not present

## 2020-10-30 DIAGNOSIS — B955 Unspecified streptococcus as the cause of diseases classified elsewhere: Secondary | ICD-10-CM | POA: Diagnosis not present

## 2020-10-30 LAB — CBC
HCT: 24.9 % — ABNORMAL LOW (ref 39.0–52.0)
Hemoglobin: 8 g/dL — ABNORMAL LOW (ref 13.0–17.0)
MCH: 28.7 pg (ref 26.0–34.0)
MCHC: 32.1 g/dL (ref 30.0–36.0)
MCV: 89.2 fL (ref 80.0–100.0)
Platelets: 403 10*3/uL — ABNORMAL HIGH (ref 150–400)
RBC: 2.79 MIL/uL — ABNORMAL LOW (ref 4.22–5.81)
RDW: 15.1 % (ref 11.5–15.5)
WBC: 10.4 10*3/uL (ref 4.0–10.5)
nRBC: 0 % (ref 0.0–0.2)

## 2020-10-30 LAB — BASIC METABOLIC PANEL
Anion gap: 6 (ref 5–15)
BUN: 25 mg/dL — ABNORMAL HIGH (ref 8–23)
CO2: 29 mmol/L (ref 22–32)
Calcium: 8.6 mg/dL — ABNORMAL LOW (ref 8.9–10.3)
Chloride: 100 mmol/L (ref 98–111)
Creatinine, Ser: 0.86 mg/dL (ref 0.61–1.24)
GFR, Estimated: >60 mL/min (ref >60)
Glucose, Bld: 166 mg/dL — ABNORMAL HIGH (ref 70–99)
Potassium: 4.2 mmol/L (ref 3.5–5.1)
Sodium: 135 mmol/L (ref 135–145)

## 2020-10-30 LAB — GLUCOSE, CAPILLARY
Glucose-Capillary: 130 mg/dL — ABNORMAL HIGH (ref 70–99)
Glucose-Capillary: 280 mg/dL — ABNORMAL HIGH (ref 70–99)
Glucose-Capillary: 290 mg/dL — ABNORMAL HIGH (ref 70–99)
Glucose-Capillary: 261 mg/dL — ABNORMAL HIGH (ref 70–99)

## 2020-10-30 MED ORDER — SODIUM CHLORIDE 0.9 % IV SOLN
2.0000 g | Freq: Two times a day (BID) | INTRAVENOUS | Status: DC
  Administered 2020-10-30 – 2020-11-01 (×5): 2 g via INTRAVENOUS
  Filled 2020-10-30 (×4): qty 20
  Filled 2020-10-30 (×2): qty 2

## 2020-10-30 MED ORDER — IPRATROPIUM-ALBUTEROL 0.5-2.5 (3) MG/3ML IN SOLN
3.0000 mL | Freq: Two times a day (BID) | RESPIRATORY_TRACT | Status: DC
  Administered 2020-10-30 – 2020-10-31 (×2): 3 mL via RESPIRATORY_TRACT
  Filled 2020-10-30 (×2): qty 3

## 2020-10-30 MED ORDER — GLUCERNA SHAKE PO LIQD
237.0000 mL | Freq: Two times a day (BID) | ORAL | Status: DC
  Administered 2020-10-31 – 2020-11-01 (×4): 237 mL via ORAL

## 2020-10-30 MED ORDER — INSULIN ASPART 100 UNIT/ML IJ SOLN
4.0000 [IU] | Freq: Three times a day (TID) | INTRAMUSCULAR | Status: DC
  Administered 2020-10-31 – 2020-11-01 (×4): 4 [IU] via SUBCUTANEOUS
  Filled 2020-10-30 (×4): qty 1

## 2020-10-30 MED ORDER — FERROUS SULFATE 325 (65 FE) MG PO TABS
325.0000 mg | ORAL_TABLET | Freq: Three times a day (TID) | ORAL | Status: DC
  Administered 2020-10-30 – 2020-11-01 (×7): 325 mg via ORAL
  Filled 2020-10-30 (×7): qty 1

## 2020-10-30 NOTE — Evaluation (Addendum)
Physical Therapy Evaluation Patient Details Name: Raymond Hays MRN: [id number] DOB: 1945/12/21 Today's Date: 10/30/2020   History of Present Illness  Raymond Hays is a 74yoM who comes [facility] on 7/18 from [facility] with 4 days weakness, DOE. PMH: DM2, HTN, COPD, GAD, PND, PAF. Pt admitted c ARI, sepsis, AKI, dehydration. At baseline pt is able to perform supervised transfers bed to/from [facility], able to perform intermittent limited self propulsion in WC. Pt reportedly could AMB up to 60ft with therapy team using a RW.  Clinical Impression  Pt in bed upon entry, agreeable to session, reports fatigue after working with OT. Pt reports his neck pain to be better controlled than last session. Pt assisted to EOB, Max+2 Assist, improved ability to put forth effort. Pt immediately able to sit EOB without any delay in balance. Pt stays at EOB >15 minutes participating in intermittent exercise. Pt unable to rise to standing despite multiple attempts with heavy assist.remains generally weak and rigid in BLE. Pt will continue to benefit from skilled intervention to maximize safety, tolerance, and independence in basic mobility for ADL.     Follow Up Recommendations SNF;Supervision for mobility/OOB    Equipment Recommendations  None recommended by PT    Recommendations for Other Services       Precautions / Restrictions Precautions Precautions: Fall Restrictions Weight Bearing Restrictions: No      Mobility  Bed Mobility Overal bed mobility: Needs Assistance Bed Mobility: Supine to Sit;Sit to Supine     Supine to sit: +2 for safety/equipment;+2 for physical assistance;Max assist Sit to supine: Max assist;+2 for physical assistance;+2 for safety/equipment        Transfers Overall transfer level: Needs assistance Equipment used: None Transfers: Sit to/from Stand Sit to Stand: Total assist;+2 physical assistance         General transfer comment: multiple attempts from elevated  surface  Ambulation/Gait Ambulation/Gait assistance:  (unable)              Stairs            Wheelchair Mobility    Modified Rankin (Stroke Patients Only)       Balance                                             Pertinent Vitals/Pain Pain Assessment: Faces Faces Pain Scale: Hurts a little bit Pain Location: Neck Pain Intervention(s): Limited activity within patient's tolerance;Monitored during session;Premedicated before session;Repositioned    Home Living                        Prior Function                 Hand Dominance        Extremity/Trunk Assessment                Communication      Cognition Arousal/Alertness: Awake/alert Behavior During Therapy: WFL for tasks assessed/performed Overall Cognitive Status: Within Functional Limits for tasks assessed                                 General Comments: Pt A and O x 4, gruff, pain limited      General Comments      Exercises Other Exercises Other Exercises: Sitting EOB  x15 minutes Other Exercises: Seated marching, reciprocal, 2x20 (modA for RLE) Other Exercises: Attempted STS transfers x3   Assessment/Plan    PT Assessment Patient needs continued PT services  PT Problem List Decreased strength;Decreased range of motion;Decreased activity tolerance;Decreased balance;Decreased mobility;Decreased coordination;Decreased safety awareness;Decreased knowledge of use of DME;Decreased knowledge of precautions;Cardiopulmonary status limiting activity       PT Treatment Interventions Balance training;Functional mobility training;Therapeutic activities;Therapeutic exercise;Patient/family education;Neuromuscular re-education    PT Goals (Current goals can be found in the Care Plan section)  Acute Rehab PT Goals Patient Stated Goal: to have less pain, less penile leakage PT Goal Formulation: With patient Time For Goal Achievement:  10/31/20 Potential to Achieve Goals: Fair    Frequency Min 2X/week   Barriers to discharge        Co-evaluation               AM-PAC PT "6 Clicks" Mobility  Outcome Measure Help needed turning from your back to your side while in a flat bed without using bedrails?: A Lot Help needed moving from lying on your back to sitting on the side of a flat bed without using bedrails?: Total Help needed moving to and from a bed to a chair (including a wheelchair)?: Total Help needed standing up from a chair using your arms (e.g., wheelchair or bedside chair)?: Total Help needed to walk in hospital room?: Total Help needed climbing 3-5 steps with a railing? : Total 6 Click Score: 7    End of Session Equipment Utilized During Treatment: Gait belt Activity Tolerance: Patient tolerated treatment well;Patient limited by fatigue Patient left: in bed;with call bell/phone within reach;with bed alarm set;with SCD's reapplied Nurse Communication: Mobility status PT Visit Diagnosis: Difficulty in walking, not elsewhere classified (R26.2);Other abnormalities of gait and mobility (R26.89);Muscle weakness (generalized) (M62.81);History of falling (Z91.81)    Time: 5956-3875 PT Time Calculation (min) (ACUTE ONLY): 30 min   Charges:   PT Evaluation $PT Re-evaluation: 1 Re-eval PT Treatments $Therapeutic Exercise: 23-37 mins       5:48 PM, 10/30/20 Rosamaria Lints, PT, DPT Physical Therapist - [facility]  [phone] (ASCOM)    Briza Bark C 10/30/2020, 5:43 PM

## 2020-10-30 NOTE — Progress Notes (Signed)
ID PROGRESS NOTE  Patient being treated for strep G bacteremia but now found to have neck pain with imaging showing signal concern for soft tissue edema and enhancement involving prevertebral area from clivus to c2-3? Concern for early skull base infection  - recommendation to change iv abtx to ceftriaxone 2gm IV Q12hr. - Dr Rivka Safer to see patient tomorrow  Aram Beecham B. Drue Second MD MPH [facility] [phone]

## 2020-10-30 NOTE — Evaluation (Signed)
Occupational Therapy Re-evaluation Patient Details Name: Raymond Hays MRN: [id number] DOB: 10/12/1945 Today's Date: 10/30/2020    History of Present Illness Raymond Hays is a 21yoM who comes to [facility] on 7/18 from [facility] with 4 days weakness, DOE. PMH: DM2, HTN, COPD, GAD, PND, PAF. Pt admitted c ARI, sepsis, AKI, dehydration. At baseline pt is able to perform supervised transfers bed to/from [facility], able to perform intermittent limited self propulsion in WC. Pt reportedly could AMB up to 62f with therapy team using a RW.   Clinical Impression   Pt seen for OT re-evaluation on this date. Upon arrival to room, pt asleep however easily awoken and agreeable to session. Pt reporting decreased pain compared to last week and motivated to participate in ADLs while seated EOB. Pt currently requires MIN-MOD A for bed mobility, SET-UP/MIN GUARD for seated grooming tasks at EOB, MIN A for seated UB dressing, and MAX A for seated UB bathing. Pt is making good progress toward goals; 1/3 has been met and pt is making good progress towards remaining 2 goals however progress continues to be impacted by pain. Pt continues to benefit from skilled OT services to maximize return to PLOF and minimize risk of future falls, injury, caregiver burden, and readmission. Will continue to follow POC. Discharge recommendation remains appropriate.      Follow Up Recommendations  SNF    Equipment Recommendations  Other (comment) (TBD)       Precautions / Restrictions Precautions Precautions: Fall Restrictions Weight Bearing Restrictions: No      Mobility Bed Mobility Overal bed mobility: Needs Assistance Bed Mobility: Supine to Sit;Sit to Supine     Supine to sit: HOB elevated;Min assist Sit to supine: Mod assist   General bed mobility comments: MIN A for trunk elevation during supine>sit. MOD A for LE management during sit>supine    Transfers                      Balance Overall balance assessment:  Needs assistance Sitting-balance support: No upper extremity supported;Feet supported Sitting balance-Leahy Scale: Poor Sitting balance - Comments: Intermittent MIN A d/t posterior lean during seated ADLs at EOB                                   ADL either performed or assessed with clinical judgement   ADL Overall ADL's : Needs assistance/impaired     Grooming: Wash/dry face;Oral care;Set up;Sitting;Min guard Grooming Details (indicate cue type and reason): Able to complete while sitting EOB following set-up assist. MIN GUARD for dynamic sitting balance Upper Body Bathing: Maximal assistance;Sitting Upper Body Bathing Details (indicate cue type and reason): Pt with difficulty performing horizontal adduction, requiring MAX A to wash UB     Upper Body Dressing : Minimal assistance;Sitting Upper Body Dressing Details (indicate cue type and reason): MIN A to don hospital gown over shoulders; pt able doff overhead with supervision while sitting EOB                         Pertinent Vitals/Pain Pain Assessment: Faces Faces Pain Scale: Hurts little more Pain Location: Neck Pain Descriptors / Indicators: Aching;Discomfort;Grimacing Pain Intervention(s): Limited activity within patient's tolerance;Monitored during session;Premedicated before session;Repositioned;RN gave pain meds during session              Cognition Arousal/Alertness: Awake/alert Behavior During Therapy: [facility]  Overall Cognitive Status: Within Functional Limits for tasks assessed                                            OT Problem List: Decreased strength;Decreased range of motion;Decreased activity tolerance;Impaired balance (sitting and/or standing);Decreased coordination;Impaired UE functional use;Pain      OT Treatment/Interventions: Self-care/ADL training;Therapeutic exercise;Energy conservation;DME and/or AE instruction;Therapeutic  activities;Patient/family education;Balance training    OT Goals(Current goals can be found in the care plan section) Acute Rehab OT Goals Patient Stated Goal: to have less pain OT Goal Formulation: With patient Time For Goal Achievement: 11/13/20 Potential to Achieve Goals: Fair ADL Goals Additional ADL Goal #1: In preparation for seated UB ADLs, pt will complete bed mobility MIN Ax1  OT Frequency: Min 1X/week    AM-PAC OT "6 Clicks" Daily Activity     Outcome Measure Help from another person eating meals?: A Little Help from another person taking care of personal grooming?: A Little Help from another person toileting, which includes using toliet, bedpan, or urinal?: Total Help from another person bathing (including washing, rinsing, drying)?: A Lot Help from another person to put on and taking off regular upper body clothing?: A Lot Help from another person to put on and taking off regular lower body clothing?: A Lot 6 Click Score: 13   End of Session Nurse Communication: Mobility status  Activity Tolerance: Patient tolerated treatment well Patient left: in bed;with call bell/phone within reach;with bed alarm set;with nursing/sitter in room  OT Visit Diagnosis: Muscle weakness (generalized) (M62.81);Pain;Unsteadiness on feet (R26.81) Pain - Right/Left: Left Pain - part of body: Shoulder;Arm;Knee (neck)                Time: 8097-0449 OT Time Calculation (min): 35 min Charges:  OT General Charges $OT Visit: 1 Visit OT Evaluation $OT Re-eval: 1 Re-eval OT Treatments $Self Care/Home Management : 23-37 mins  Fredirick Maudlin, OTR/L [place]

## 2020-10-30 NOTE — Progress Notes (Signed)
PROGRESS NOTE    Cardarius Hays  ZWC:[id number] DOB: 01/04/1946 DOA: 10/16/2020 PCP: [facility]   Brief Narrative: Taken from prior notes. Raymond Hays is a 75 y.o. Caucasian male with medical history significant for type 2 diabetes mellitus, dyslipidemia, hypertension, COPD, and anxiety and allergic rhinitis, who presented to the ER with acute onset of generalized weakness over the last 4 days at her SNF. Admitted with sepsis secondary to group C streptococcus bacteremia.  Started on Rocephin and will need 4 weeks of antibiotics till 11/13/2020.  TTE is without any concern of endocarditis.  Repeat blood cultures on 05/22/2020 remains negative. Patient had neck, right knee and right elbow pain.  Orthopedic surgery did not believe the right knee or elbow was septic.  CT cervical spine did not show any osteomyelitis but does have old hardware there.  The patient developed ileus during the hospital course.  Patient finally had a bowel movement on 10/24/2020 and diet advanced to solid food.  PICC line placed.    Patient had large maroon-colored bowel movement overnight, drop in hemoglobin about 2 g. GI was consulted and Eliquis was stopped-going for EGD and flexible sigmoidoscopy tomorrow.  Repeat cervical spine imaging due to worsening neck pain was without any significant changes or signs of infection.  Right knee CT was also without any significant abnormality.  Neurosurgery was consulted due to worsening neck pain and an MRI was done with and without contrast which shows some concern of edema/possible early infection at the base of skull and involving C1-C3, no intervention needed per neurosurgery at this time, might need little prolonged duration of antibiotics-we will defer that decision to infectious disease.  Subjective: Having some neck stiffness but pain since improving.  He was more concerned about his urinary leakage and wants to see his urologist.  Assessment & Plan:   Active  Problems:   AF (paroxysmal atrial fibrillation) (HCC)   Acute pain of right knee   COPD with acute exacerbation (HCC)   Sepsis (HCC)   Neck pain   AKI (acute kidney injury) (HCC)   Type 2 diabetes mellitus with diabetic neuropathy, without long-term current use of insulin (HCC)   Parkinson disease (HCC)   Ileus (HCC)  Sepsis secondary to group C streptococcal bacteremia.  Remained afebrile.  Continue to have neck pain.  Initial CT cervical spine done on 10/19/2020 was negative for any infection.  Patient do have hardware.  Repeat blood cultures remain negative. -ID was consulted-appreciate their recommendations. -Will need IV antibiotics till 11/13/2020, ID switched him back to ceftriaxone . Might need little more prolonged outpatient antibiotics, will appreciate ID input regarding MRI findings.  No surgical intervention needed per neurosurgery.  Neck pain.  Improving.  Initial cervical spine imaging was negative for any osteomyelitis.  Patient do have hardware.  Repeat CT cervical spine and right knee was again without any significant new abnormality or signs of infection.  Neurosurgery was also consulted and MRI was done with concern of some edema and possible signs of early infection involving C1-C3 and the base of skull. No need for any surgical intervention at this time. -Continue Dilaudid along with tramadol. -Continue steroid for 2-3 more days. -Continue Robaxin -Continue lidocaine patch -Continue with PT  Ileus.  Resolved having multiple bowel movements. -Back of from bowel regimen. -Continue to monitor  Concern of melena.  FOBT positive.  -GI was consulted-patient underwent EGD and sigmoidoscopy , found to have a nonbleeding duodenal ulcer, GI is recommending holding Eliquis for  another 5 days and starting him on twice daily PPI for 8 weeks. -Holding Eliquis-for another 5 days as recommended by GI.  Will be resumed on 11/02/2020 -Protonix twice daily -Monitor  hemoglobin -Transfuse if below 7  COPD exacerbation.  Resolved.  Completed a course of steroid. -Continue with breathing treatment and supportive care  Chronic suprapubic catheter.  Changed by urology on 10/23/2020.  Concern of urinary leakage through penis. -Another message sent to urology. -Continue changing monthly. -Outpatient urology follow-up  Right knee pain and right elbow pain.  No signs of septic joints as per orthopedic surgery.  -Right knee CT scan was without any significant new abnormality or infection. - Diclofenac gel ordered for right knee.  AKI.  Resolved  Parkinson's disease. -Continue Sinemet.  Paroxysmal atrial fibrillation. -Continue with metoprolol -Holding Eliquis for concern of GI bleed  Type 2 diabetes mellitus with neuropathy.  A1c of 6.6.  CBG trending up as patient was started on steroid. -Switch him to resistance/steroid SSI. -Continue with gabapentin  Essential hypertension.  Blood pressure within goal. -Continue with metoprolol  Generalized weakness. -PT is recommending SNF placement-pending bed availability and insurance authorization.  Objective: Vitals:   10/30/20 0817 10/30/20 1343 10/30/20 1352 10/30/20 1544  BP:    112/70  Pulse:    78  Resp:      Temp:    97.6 F (36.4 C)  TempSrc:      SpO2: 99%  97% 97%  Weight:  112.1 kg    Height:  5\' 7"  (1.702 m)      Intake/Output Summary (Last 24 hours) at 10/30/2020 1643 Last data filed at 10/30/2020 1023 Gross per 24 hour  Intake 894.29 ml  Output 700 ml  Net 194.29 ml    Filed Weights   10/16/20 1534 10/30/20 1343  Weight: 103 kg 112.1 kg    Examination:  General.  Well-developed elderly man, in no acute distress. Pulmonary.  Lungs clear bilaterally, normal respiratory effort. CV.  Regular rate and rhythm, no JVD, rub or murmur. Abdomen.  Soft, nontender, nondistended, BS positive. CNS.  Alert and oriented x3.  No focal neurologic deficit. Extremities.  1+ LE edema, no  cyanosis, pulses intact and symmetrical. Psychiatry.  Judgment and insight appears normal.   DVT prophylaxis: SCDs, holding Eliquis due to GI bleed. Code Status: Full Family Communication: Discussed with patient. Disposition Plan:  Status is: Inpatient  Remains inpatient appropriate because:Inpatient level of care appropriate due to severity of illness  Dispo: The patient is from: SNF              Anticipated d/c is to: SNF              Patient currently is medically stable to d/c.   Difficult to place patient No               Level of care: Med-Surg  All the records are reviewed and case discussed with Care Management/Social Worker. Management plans discussed with the patient, nursing and they are in agreement.  Consultants:  Infectious disease Neurosurgery Urology  Procedures:  Antimicrobials:  Ceftriaxone  Data Reviewed: I have personally reviewed following labs and imaging studies  CBC: Recent Labs  Lab 10/25/20 1703 10/26/20 0450 10/27/20 0604 10/28/20 0500 10/30/20 0449  WBC 12.3* 13.0* 10.3 8.8 10.4  HGB 9.9* 9.2* 8.5* 8.3* 8.0*  HCT 30.5* 27.6* 25.6* 25.6* 24.9*  MCV 87.1 87.3 87.7 88.6 89.2  PLT 393 367 336 335 403*    Basic  Metabolic Panel: Recent Labs  Lab 10/26/20 0450 10/27/20 0604 10/29/20 0557 10/30/20 0449  NA 134* 135 135 135  K 4.4 4.1 4.4 4.2  CL 97* 99 100 100  CO2 30 32 30 29  GLUCOSE 146* 128* 192* 166*  BUN 24* 18 18 25*  CREATININE 0.86 0.71 0.82 0.86  CALCIUM 8.3* 8.2* 8.7* 8.6*    GFR: Estimated Creatinine Clearance: 87.3 mL/min (by C-G formula based on SCr of 0.86 mg/dL). Liver Function Tests: No results for input(s): AST, ALT, ALKPHOS, BILITOT, PROT, ALBUMIN in the last 168 hours. No results for input(s): LIPASE, AMYLASE in the last 168 hours. No results for input(s): AMMONIA in the last 168 hours. Coagulation Profile: Recent Labs  Lab 10/26/20 0900  INR 1.4*    Cardiac Enzymes: No results for input(s): CKTOTAL,  CKMB, CKMBINDEX, TROPONINI in the last 168 hours. BNP (last 3 results) No results for input(s): PROBNP in the last 8760 hours. HbA1C: No results for input(s): HGBA1C in the last 72 hours.  CBG: Recent Labs  Lab 10/29/20 1628 10/29/20 2115 10/30/20 0734 10/30/20 1126 10/30/20 1616  GLUCAP 280* 298* 130* 280* 290*    Lipid Profile: No results for input(s): CHOL, HDL, LDLCALC, TRIG, CHOLHDL, LDLDIRECT in the last 72 hours. Thyroid Function Tests: No results for input(s): TSH, T4TOTAL, FREET4, T3FREE, THYROIDAB in the last 72 hours. Anemia Panel: No results for input(s): VITAMINB12, FOLATE, FERRITIN, TIBC, IRON, RETICCTPCT in the last 72 hours.  Sepsis Labs: No results for input(s): PROCALCITON, LATICACIDVEN in the last 168 hours.  Recent Results (from the past 240 hour(s))  SARS CORONAVIRUS 2 (TAT 6-24 HRS) Nasopharyngeal Nasopharyngeal Swab     Status: None   Collection Time: 10/25/20 11:00 AM   Specimen: Nasopharyngeal Swab  Result Value Ref Range Status   SARS Coronavirus 2 NEGATIVE NEGATIVE Final    Comment: (NOTE) SARS-CoV-2 target nucleic acids are NOT DETECTED.  The SARS-CoV-2 RNA is generally detectable in upper and lower respiratory specimens during the acute phase of infection. Negative results do not preclude SARS-CoV-2 infection, do not rule out co-infections with other pathogens, and should not be used as the sole basis for treatment or other patient management decisions. Negative results must be combined with clinical observations, patient history, and epidemiological information. The expected result is Negative.  Fact Sheet for Patients: HairSlick.nohttps://www.fda.gov/media/138098/download  Fact Sheet for Healthcare Providers: quierodirigir.comhttps://www.fda.gov/media/138095/download  This test is not yet approved or cleared by the [place] FDA and  has been authorized for detection and/or diagnosis of SARS-CoV-2 by FDA under an Emergency Use Authorization (EUA). This EUA  will remain  in effect (meaning this test can be used) for the duration of the COVID-19 declaration under Se ction 564(b)(1) of the Act, 21 U.S.C. section 360bbb-3(b)(1), unless the authorization is terminated or revoked sooner.  Performed at [facility], [address] [place], [place] [address]   KOH prep     Status: None   Collection Time: 10/27/20 12:12 PM   Specimen: Esophagus  Result Value Ref Range Status   Specimen Description ESOPHAGUS  Final   Special Requests Normal  Final   KOH Prep   Final    YEAST WITH PSEUDOHYPHAE Performed at [facility], [address] [place], [place] [address]    Report Status 10/27/2020 FINAL  Final  MRSA Next Gen by PCR, Nasal     Status: Abnormal   Collection Time: 10/28/20  8:46 PM   Specimen: Nasal Mucosa; Nasal Swab  Result  Value Ref Range Status   MRSA by PCR Next Gen DETECTED (A) NOT DETECTED Final    Comment: RESULT CALLED TO, READ BACK BY AND VERIFIED WITH: MARCELL TURNER @2215  10/28/2020 LFD (NOTE) The GeneXpert MRSA Assay (FDA approved for NASAL specimens only), is one component of a comprehensive MRSA colonization surveillance program. It is not intended to diagnose MRSA infection nor to guide or monitor treatment for MRSA infections. Test performance is not FDA approved in patients less than 8 years old. Performed at [facility], [address], [place], [place]       Radiology Studies: No results found.  Scheduled Meds:  ascorbic acid  500 mg Oral Daily   benzonatate  100 mg Oral TID   budesonide (PULMICORT) nebulizer solution  0.5 mg Nebulization BID   carbidopa-levodopa  1 tablet Oral TID   Chlorhexidine Gluconate Cloth  6 each Topical Daily   diclofenac Sodium  4 g Topical QID   escitalopram  5 mg Oral Daily   feeding supplement (GLUCERNA SHAKE)  237 mL Oral BID BM   ferrous sulfate  325 mg Oral TID WC   finasteride  5 mg Oral Daily   fluticasone  1 spray Each Nare  Daily   furosemide  40 mg Oral Daily   gabapentin  800 mg Oral BID   guaiFENesin  600 mg Oral Q12H   insulin aspart  0-20 Units Subcutaneous TID WC   insulin aspart  0-5 Units Subcutaneous QHS   [START ON 10/31/2020] insulin aspart  4 Units Subcutaneous TID WC   ipratropium  1 spray Each Nare BID   ipratropium-albuterol  3 mL Nebulization BID   lactase  3,000 Units Oral TID WC   lactulose  30 g Oral Daily   lidocaine  1 patch Transdermal Q24H   loratadine  10 mg Oral Daily   methocarbamol  500 mg Oral TID   metoprolol succinate  50 mg Oral Daily   montelukast  10 mg Oral QHS   mupirocin ointment  1 application Nasal BID   pantoprazole  40 mg Oral BID AC   predniSONE  50 mg Oral Q breakfast   sodium chloride flush  10-40 mL Intracatheter Q12H   tamsulosin  0.4 mg Oral Daily   traZODone  100 mg Oral QHS   Continuous Infusions:  sodium chloride 10 mL/hr at 10/28/20 1715   cefTRIAXone (ROCEPHIN)  IV 2 g (10/30/20 1220)     LOS: 14 days   Time spent: 35 minutes. More than 50% of the time was spent in counseling/coordination of care  12/30/20, MD [facility]  If 7PM-7AM, please contact night-coverage [email or web address]  10/30/2020, 4:43 PM   This record has been created using 12/30/2020. Errors have been sought and corrected,but may not always be located. Such creation errors do not reflect on the standard of care.

## 2020-10-30 NOTE — Progress Notes (Signed)
Inpatient Diabetes Program Recommendations  AACE/ADA: New Consensus Statement on Inpatient Glycemic Control (2015)  Target Ranges:  Prepandial:   less than 140 mg/dL      Peak postprandial:   less than 180 mg/dL (1-2 hours)      Critically ill patients:  140 - 180 mg/dL   Results for DAMAREA, MERKEL (MRN [id number]) as of 10/30/2020 12:28  Ref. Range 10/29/2020 08:13 10/29/2020 11:04 10/29/2020 16:28 10/29/2020 21:15  Glucose-Capillary Latest Ref Range: 70 - 99 mg/dL 753 (H) 005 (H) 110 (H) 298 (H)  Results for PINCHAS, REITHER (MRN [id number]) as of 10/30/2020 12:28  Ref. Range 10/30/2020 07:34 10/30/2020 11:26  Glucose-Capillary Latest Ref Range: 70 - 99 mg/dL 014 (H) 103 (H)     Home DM Meds: Metformin 500 mg Daily  Current Orders: Novolog 0-20 units TID AC + HS     MD- Note patient getting Prednisone 50 mg Daily  Afternoon CBGs elevated and pt eating 100% of meals per documentation  Please consider starting Novolog Meal Coverage:   Novolog 4 units TID with meals to start  Hold if pt eats <50% of meal, Hold if pt NPO    --Will follow patient during hospitalization--  Ambrose Finland RN, MSN, CDE Diabetes Coordinator Inpatient Glycemic Control Team Team Pager: [phone] (8a-5p)

## 2020-10-30 NOTE — Progress Notes (Signed)
Nutrition Follow-up  DOCUMENTATION CODES:  Obesity unspecified  INTERVENTION:  Continue current diet as ordered, encourage PO intake Glucerna Shake po BID, each supplement provides 220 kcal and 10 grams of protein  NUTRITION DIAGNOSIS:  Inadequate oral intake related to poor appetite as evidenced by per patient/family report.  GOAL:  Patient will meet greater than or equal to 90% of their needs  MONITOR:  PO intake, Weight trends, Supplement acceptance  REASON FOR ASSESSMENT:  Consult Assessment of nutrition requirement/status  ASSESSMENT:  Pt presented to ED from [facility] for weakness worsening x 4 days. PMH relevant for A. Fib, COPD, HTN, HLD, urinary retention with chronic suprapubic catheter, and DM.  Will return to [facility] at discharge where he is a long term resident.   Pt complained of right knee and elbow pain in ED. Orthopedics evaluated on 7/20 and determined presentation was not consistent with septic joint and TEE negative for endocarditis. Neurosurgery ordered MRI 7/28 with concern of some edema and possible signs of early infection involving C1-C3 and the base of skull but did not recommend surgical intervention at this time.   Pt continues to have complaints that catheter is leaking. Urology aware  Pt had a maroon colored stool 7/28 after having heme positive stool 7/27. GI consulted and pt taken for EGD/colonoscopy 7/29. Colonoscopy had no findings (poor prep) but EGD showed possible candidiasis and several non-bleeding ulcers in the duodenum   Pt resting in bed at the time of visit. States that he is feeling much better today and appetite has improved from last week. Reports he didn't feel well several days last week and was in pain and wasn't able to eat much. Very complimentary of his breakfast. Glucose noted to be high over the last 24 hours, will adjust supplements to glucerna as pt does like them and appetite is improving.    Average Meal  Intake: 7/18-7/19: 70% intake x 1 recorded meal 7/20-7/26: 31% intake x 8 recorded meals (0-100%) 7/27-8/1: 57% intake x 7 recorded meals (0-100%)  Nutritionally Relevant Medications: Scheduled Meds:  ascorbic acid  500 mg Oral Daily   feeding supplement  237 mL Oral BID BM   ferrous sulfate  325 mg Oral TID WC   furosemide  40 mg Oral Daily   insulin aspart  0-20 Units Subcutaneous TID WC   insulin aspart  0-5 Units Subcutaneous QHS   lactase  3,000 Units Oral TID WC   lactulose  30 g Oral Daily   pantoprazole  40 mg Oral BID AC   predniSONE  50 mg Oral Q breakfast   Continuous Infusions:   ceFAZolin (ANCEF) IV 2 g (10/30/20 0504)   PRN Meds: bisacodyl, calcium carbonate, magnesium hydroxide, ondansetron  Labs Reviewed: BUN 25 SBG ranges from 130-298 mg/dL over the last 24 hours  NUTRITION - FOCUSED PHYSICAL EXAM: Flowsheet Row Most Recent Value  Orbital Region No depletion  Upper Arm Region No depletion  Thoracic and Lumbar Region No depletion  Buccal Region No depletion  Temple Region No depletion  Clavicle Bone Region No depletion  Clavicle and Acromion Bone Region No depletion  Scapular Bone Region No depletion  Dorsal Hand No depletion  Patellar Region No depletion  Anterior Thigh Region No depletion  Posterior Calf Region No depletion  Edema (RD Assessment) Mild  Hair Reviewed  Eyes Reviewed  Mouth Reviewed  Skin Reviewed  Nails Reviewed   Diet Order:   Diet Order  Diet heart healthy/carb modified Room service appropriate? Yes; Fluid consistency: Thin  Diet effective now                   EDUCATION NEEDS:  No education needs have been identified at this time  Skin:  Skin Assessment: Reviewed RN Assessment  Last BM:  7/31 - type 6  Height:  Ht Readings from Last 1 Encounters:  10/30/20 5\' 7"  (1.702 m)    Weight:  Wt Readings from Last 1 Encounters:  10/30/20 112.1 kg    Ideal Body Weight:  67.3 kg  BMI:  Body mass index  is 38.71 kg/m.  Estimated Nutritional Needs:  Kcal:  1800-2000 kcal/d Protein:  90-100g/d Fluid:  1.8-2 L/d  12/30/20, RD, LDN Clinical Dietitian Pager on Amion

## 2020-10-31 DIAGNOSIS — R7881 Bacteremia: Secondary | ICD-10-CM | POA: Diagnosis not present

## 2020-10-31 DIAGNOSIS — B955 Unspecified streptococcus as the cause of diseases classified elsewhere: Secondary | ICD-10-CM | POA: Diagnosis not present

## 2020-10-31 DIAGNOSIS — M4622 Osteomyelitis of vertebra, cervical region: Secondary | ICD-10-CM | POA: Diagnosis not present

## 2020-10-31 LAB — BASIC METABOLIC PANEL
Anion gap: 7 (ref 5–15)
BUN: 26 mg/dL — ABNORMAL HIGH (ref 8–23)
CO2: 30 mmol/L (ref 22–32)
Calcium: 8.8 mg/dL — ABNORMAL LOW (ref 8.9–10.3)
Chloride: 99 mmol/L (ref 98–111)
Creatinine, Ser: 0.87 mg/dL (ref 0.61–1.24)
GFR, Estimated: >60 mL/min (ref >60)
Glucose, Bld: 114 mg/dL — ABNORMAL HIGH (ref 70–99)
Potassium: 4 mmol/L (ref 3.5–5.1)
Sodium: 136 mmol/L (ref 135–145)

## 2020-10-31 LAB — CBC
HCT: 25.3 % — ABNORMAL LOW (ref 39.0–52.0)
Hemoglobin: 8.1 g/dL — ABNORMAL LOW (ref 13.0–17.0)
MCH: 28.3 pg (ref 26.0–34.0)
MCHC: 32 g/dL (ref 30.0–36.0)
MCV: 88.5 fL (ref 80.0–100.0)
Platelets: 388 10*3/uL (ref 150–400)
RBC: 2.86 MIL/uL — ABNORMAL LOW (ref 4.22–5.81)
RDW: 15.3 % (ref 11.5–15.5)
WBC: 8.5 10*3/uL (ref 4.0–10.5)
nRBC: 0 % (ref 0.0–0.2)

## 2020-10-31 LAB — GLUCOSE, CAPILLARY
Glucose-Capillary: 107 mg/dL — ABNORMAL HIGH (ref 70–99)
Glucose-Capillary: 316 mg/dL — ABNORMAL HIGH (ref 70–99)
Glucose-Capillary: 299 mg/dL — ABNORMAL HIGH (ref 70–99)
Glucose-Capillary: 178 mg/dL — ABNORMAL HIGH (ref 70–99)

## 2020-10-31 MED ORDER — FLUTICASONE FUROATE-VILANTEROL 100-25 MCG/INH IN AEPB
1.0000 | INHALATION_SPRAY | Freq: Every day | RESPIRATORY_TRACT | Status: DC
  Administered 2020-10-31 – 2020-11-01 (×2): 1 via RESPIRATORY_TRACT
  Filled 2020-10-31: qty 28

## 2020-10-31 MED ORDER — UMECLIDINIUM BROMIDE 62.5 MCG/INH IN AEPB
1.0000 | INHALATION_SPRAY | Freq: Every day | RESPIRATORY_TRACT | Status: DC
  Administered 2020-10-31 – 2020-11-01 (×2): 1 via RESPIRATORY_TRACT
  Filled 2020-10-31: qty 7

## 2020-10-31 MED ORDER — IPRATROPIUM-ALBUTEROL 0.5-2.5 (3) MG/3ML IN SOLN: 3.0000 mL | RESPIRATORY_TRACT | Status: DC | PRN

## 2020-10-31 NOTE — Progress Notes (Addendum)
Date of Admission:  10/16/2020     ID: Raymond Hays is a 75 y.o. male  Active Problems:   AF (paroxysmal atrial fibrillation) (HCC)   Acute pain of right knee   COPD with acute exacerbation (HCC)   Sepsis ([place])   Neck pain   AKI (acute kidney injury) ([place])   Type 2 diabetes mellitus with diabetic neuropathy, without long-term current use of insulin ([place])   Parkinson disease ([place])   Ileus ([place])    Subjective: Has pain in his neck but slightly better than before. No abdominal pain  Medications:   ascorbic acid  500 mg Oral Daily   benzonatate  100 mg Oral TID   carbidopa-levodopa  1 tablet Oral TID   Chlorhexidine Gluconate Cloth  6 each Topical Daily   diclofenac Sodium  4 g Topical QID   escitalopram  5 mg Oral Daily   feeding supplement (GLUCERNA SHAKE)  237 mL Oral BID BM   ferrous sulfate  325 mg Oral TID WC   finasteride  5 mg Oral Daily   fluticasone  1 spray Each Nare Daily   fluticasone furoate-vilanterol  1 puff Inhalation Daily   furosemide  40 mg Oral Daily   gabapentin  800 mg Oral BID   guaiFENesin  600 mg Oral Q12H   insulin aspart  0-20 Units Subcutaneous TID WC   insulin aspart  0-5 Units Subcutaneous QHS   insulin aspart  4 Units Subcutaneous TID WC   ipratropium  1 spray Each Nare BID   lactase  3,000 Units Oral TID WC   lactulose  30 g Oral Daily   lidocaine  1 patch Transdermal Q24H   loratadine  10 mg Oral Daily   methocarbamol  500 mg Oral TID   metoprolol succinate  50 mg Oral Daily   montelukast  10 mg Oral QHS   mupirocin ointment  1 application Nasal BID   pantoprazole  40 mg Oral BID AC   predniSONE  50 mg Oral Q breakfast   sodium chloride flush  10-40 mL Intracatheter Q12H   tamsulosin  0.4 mg Oral Daily   traZODone  100 mg Oral QHS   umeclidinium bromide  1 puff Inhalation Daily    Objective: Vital signs in last 24 hours: Temp:  [97.7 F (36.5 C)-98.1 F (36.7 C)] 98 F (36.7 C) (08/02 1540) Pulse Rate:  [66-76] 74 (08/02  1540) Resp:  [14-19] 17 (08/02 1540) BP: (119-133)/(67-76) 125/67 (08/02 1540) SpO2:  [95 %-99 %] 98 % (08/02 1540)  PHYSICAL EXAM:  General: Alert, cooperative, some distress, appears stated age.  Head: Normocephalic, without obvious abnormality, atraumatic. Eyes: Conjunctivae clear, anicteric sclerae. Pupils are equal ENT Nares normal. No drainage or sinus tenderness. Lips, mucosa, and tongue normal. No Thrush Neck: Tenderness cervical spine no carotid bruit and no JVD. Valve Lungs: Bilateral air entry  Heart sounds irregular but controlled Abdomen: Soft, non-tender,not distended. Bowel sounds normal. No masses Extremities: atraumatic, no cyanosis. No edema. No clubbing Skin: No rashes or lesions. Or bruising Lymph: Cervical, supraclavicular normal. Neurologic: Grossly non-focal  Lab Results Recent Labs    10/30/20 0449 10/31/20 0600  WBC 10.4 8.5  HGB 8.0* 8.1*  HCT 24.9* 25.3*  NA 135 136  K 4.2 4.0  CL 100 99  CO2 29 30  BUN 25* 26*  CREATININE 0.86 0.87     Microbiology: Group C Streptococcus bacteremia Studies/Results: MRI Prominent soft tissue thickening with edema and enhancement involving the prevertebral  soft tissues, extending from the clivus through the C2-3 level. Finding is nonspecific, but raises the possibility for acute skull base infection given the history of sepsis and bacteremia. Question tiny superimposed 5 mm collection as above.   Assessment/Plan: Group C streptococcus bacteremia TEE negative Cervical hardware.  Prominent soft tissue thickening with edema and enhancement involving the prevertebral soft tissue extending from the clivus through the C2-3 level.  This finding raises the suspicion for a skull base infection given the history of sepsis and bacteremia.  Hence we will treat him with 6 weeks of IV ceftriaxone 2 g every 12.  Mitral valve regurgitation and congestive heart failure.  Has mitral valve clips  Paroxysmal A.  fib  Diabetes mellitus management as per primary team  AKI on CKD resolved  COPD  Constipation with ileus has resolved.  Discussed the management with the patient and the care team. OPAT orders Diagnosis: Group C streptococcus bacteremia Baseline Creatinine 0.87       No Known Allergies   OPAT Orders Discharge antibiotics: ceftriaxone 2 grams IV q 12 hours until 12/03/20   [facility] Care Per Protocol:including placement of biopatch   Labs weekly on Monday while on IV antibiotics:  CBC with differential   CMP   ESR CRP       _X_ Please pull PIC at completion of IV antibiotics _   Fax weekly labs to [phone]   Clinic Follow Up Appt: 8/25//22 at 11am with Dr.Sharaya Boruff     Call [phone] with any questions

## 2020-10-31 NOTE — Progress Notes (Signed)
PROGRESS NOTE    Raymond Hays  UTM:[id number] DOB: 11-Aug-1945 DOA: 10/16/2020 PCP: [facility]   Brief Narrative: Taken from prior notes. Raymond Hays is a 75 y.o. Caucasian male with medical history significant for type 2 diabetes mellitus, dyslipidemia, hypertension, COPD, and anxiety and allergic rhinitis, who presented to the ER with acute onset of generalized weakness over the last 4 days at her SNF. Admitted with sepsis secondary to group C streptococcus bacteremia.  Started on Rocephin and will need 4 weeks of antibiotics till 11/13/2020.  TTE is without any concern of endocarditis.  Repeat blood cultures on 05/22/2020 remains negative. Patient had neck, right knee and right elbow pain.  Orthopedic surgery did not believe the right knee or elbow was septic.  CT cervical spine did not show any osteomyelitis but does have old hardware there.  The patient developed ileus during the hospital course.  Patient finally had a bowel movement on 10/24/2020 and diet advanced to solid food.  PICC line placed.    Patient had large maroon-colored bowel movement overnight, drop in hemoglobin about 2 g. GI was consulted and Eliquis was stopped-going for EGD and flexible sigmoidoscopy tomorrow.  Repeat cervical spine imaging due to worsening neck pain was without any significant changes or signs of infection.  Right knee CT was also without any significant abnormality.  Neurosurgery was consulted due to worsening neck pain and an MRI was done with and without contrast which shows some concern of edema/possible early infection at the base of skull and involving C1-C3, no intervention needed per neurosurgery at this time, might need little prolonged duration of antibiotics-we will defer that decision to infectious disease.  Neck pain improving but patient is refusing to go back to his facility before fixing his urinary leakage, stating that I did not agree to that procedure for suprapubic catheter just to know  that I will continue to leak from penis and swelling my clothes.  Talked with urology again and requested to revisit him as they are thinking that he just need medical management and it might be due to bladder spasms.  Subjective: Having some neck stiffness but continue to improve.  He was more frustrated about his urinary leakage and refusing to go back to his facility without fixing it.  Assessment & Plan:   Active Problems:   AF (paroxysmal atrial fibrillation) (HCC)   Acute pain of right knee   COPD with acute exacerbation (HCC)   Sepsis (HCC)   Neck pain   AKI (acute kidney injury) (HCC)   Type 2 diabetes mellitus with diabetic neuropathy, without long-term current use of insulin (HCC)   Parkinson disease (HCC)   Ileus (HCC)  Sepsis secondary to group C streptococcal bacteremia.  Remained afebrile.  Continue to have neck pain.  Initial CT cervical spine done on 10/19/2020 was negative for any infection.  Patient do have hardware.  Repeat blood cultures remain negative. -ID was consulted-appreciate their recommendations. -Will need IV antibiotics till 11/13/2020, ID switched him back to ceftriaxone . Might need little more prolonged outpatient antibiotics, will appreciate ID input regarding MRI findings.  No surgical intervention needed per neurosurgery. -Dr. Rudene Anda to see patient today as she was on leave and will put final recommendations.  Neck pain.  Improving.  Initial cervical spine imaging was negative for any osteomyelitis.  Patient do have hardware.  Repeat CT cervical spine and right knee was again without any significant new abnormality or signs of infection.  Neurosurgery was also  consulted and MRI was done with concern of some edema and possible signs of early infection involving C1-C3 and the base of skull. No need for any surgical intervention at this time. -Continue Dilaudid along with tramadol. -Continue steroid for 1-2 developed melena and and Eliquis is on hold  until more days. -Continue Robaxin -Continue lidocaine patch -Continue with PT  Ileus.  Resolved having multiple bowel movements. -Back of from bowel regimen. -Continue to monitor  Concern of melena.  FOBT positive.  -GI was consulted-patient underwent EGD and sigmoidoscopy , found to have a nonbleeding duodenal ulcer, GI is recommending holding Eliquis for another 5 days and starting him on twice daily PPI for 8 weeks. -Holding Eliquis-for another 5 days as recommended by GI.  Will be resumed on 11/02/2020 -Protonix twice daily -Monitor hemoglobin -Transfuse if below 7  COPD exacerbation.  Resolved.  Completed a course of steroid. -Continue with breathing treatment and supportive care  Chronic suprapubic catheter.  Changed by urology on 10/23/2020.  Concern of urinary leakage through penis.  Talked with urology again and they will see him later today.  They rarely think that he is having some bladder spasms which can be managed medically. -Continue changing monthly. -Outpatient urology follow-up  Right knee pain and right elbow pain.  No signs of septic joints as per orthopedic surgery.  -Right knee CT scan was without any significant new abnormality or infection. - Diclofenac gel ordered for right knee.  AKI.  Resolved  Parkinson's disease. -Continue Sinemet.  Paroxysmal atrial fibrillation. -Continue with metoprolol -Holding Eliquis for concern of GI bleed-can be resumed on 11/02/2020.  Type 2 diabetes mellitus with neuropathy.  A1c of 6.6.  CBG trending up as patient was started on steroid. -Switch him to resistance/steroid SSI. -Continue with gabapentin  Essential hypertension.  Blood pressure within goal. -Continue with metoprolol  Generalized weakness. -PT is recommending SNF placement-patient will go back to his facility.  Objective: Vitals:   10/31/20 0547 10/31/20 0744 10/31/20 0821 10/31/20 1154  BP: 133/76 122/70  120/76  Pulse: 74 66 76 75  Resp: 19 14 16 17    Temp: 98.1 F (36.7 C) 97.8 F (36.6 C)  98.1 F (36.7 C)  TempSrc:    Oral  SpO2: 97% 97% 96% 95%  Weight:      Height:        Intake/Output Summary (Last 24 hours) at 10/31/2020 1437 Last data filed at 10/31/2020 1423 Gross per 24 hour  Intake 614.2 ml  Output 1950 ml  Net -1335.8 ml    Filed Weights   10/16/20 1534 10/30/20 1343  Weight: 103 kg 112.1 kg    Examination:  General.  Well-developed, little agitated gentleman,in no acute distress. Pulmonary.  Lungs clear bilaterally, normal respiratory effort. CV.  Regular rate and rhythm, no JVD, rub or murmur. Abdomen.  Soft, nontender, nondistended, BS positive. CNS.  Alert and oriented x3.  No focal neurologic deficit. Extremities.  No edema, no cyanosis, pulses intact and symmetrical. Psychiatry.  Judgment and insight appears normal.   DVT prophylaxis: SCDs, holding Eliquis due to GI bleed. Code Status: Full Family Communication: Discussed with patient. Disposition Plan:  Status is: Inpatient  Remains inpatient appropriate because:Inpatient level of care appropriate due to severity of illness  Dispo: The patient is from: SNF              Anticipated d/c is to: SNF              Patient currently is  medically stable to d/c.   Difficult to place patient No               Level of care: Med-Surg  All the records are reviewed and case discussed with Care Management/Social Worker. Management plans discussed with the patient, nursing and they are in agreement.  Consultants:  Infectious disease Neurosurgery Urology  Procedures:  Antimicrobials:  Ceftriaxone  Data Reviewed: I have personally reviewed following labs and imaging studies  CBC: Recent Labs  Lab 10/26/20 0450 10/27/20 0604 10/28/20 0500 10/30/20 0449 10/31/20 0600  WBC 13.0* 10.3 8.8 10.4 8.5  HGB 9.2* 8.5* 8.3* 8.0* 8.1*  HCT 27.6* 25.6* 25.6* 24.9* 25.3*  MCV 87.3 87.7 88.6 89.2 88.5  PLT 367 336 335 403* 388    Basic Metabolic  Panel: Recent Labs  Lab 10/26/20 0450 10/27/20 0604 10/29/20 0557 10/30/20 0449 10/31/20 0600  NA 134* 135 135 135 136  K 4.4 4.1 4.4 4.2 4.0  CL 97* 99 100 100 99  CO2 30 32 30 29 30   GLUCOSE 146* 128* 192* 166* 114*  BUN 24* 18 18 25* 26*  CREATININE 0.86 0.71 0.82 0.86 0.87  CALCIUM 8.3* 8.2* 8.7* 8.6* 8.8*    GFR: Estimated Creatinine Clearance: 86.3 mL/min (by C-G formula based on SCr of 0.87 mg/dL). Liver Function Tests: No results for input(s): AST, ALT, ALKPHOS, BILITOT, PROT, ALBUMIN in the last 168 hours. No results for input(s): LIPASE, AMYLASE in the last 168 hours. No results for input(s): AMMONIA in the last 168 hours. Coagulation Profile: Recent Labs  Lab 10/26/20 0900  INR 1.4*    Cardiac Enzymes: No results for input(s): CKTOTAL, CKMB, CKMBINDEX, TROPONINI in the last 168 hours. BNP (last 3 results) No results for input(s): PROBNP in the last 8760 hours. HbA1C: No results for input(s): HGBA1C in the last 72 hours.  CBG: Recent Labs  Lab 10/30/20 1126 10/30/20 1616 10/30/20 2043 10/31/20 0754 10/31/20 1138  GLUCAP 280* 290* 261* 107* 316*    Lipid Profile: No results for input(s): CHOL, HDL, LDLCALC, TRIG, CHOLHDL, LDLDIRECT in the last 72 hours. Thyroid Function Tests: No results for input(s): TSH, T4TOTAL, FREET4, T3FREE, THYROIDAB in the last 72 hours. Anemia Panel: No results for input(s): VITAMINB12, FOLATE, FERRITIN, TIBC, IRON, RETICCTPCT in the last 72 hours.  Sepsis Labs: No results for input(s): PROCALCITON, LATICACIDVEN in the last 168 hours.  Recent Results (from the past 240 hour(s))  SARS CORONAVIRUS 2 (TAT 6-24 HRS) Nasopharyngeal Nasopharyngeal Swab     Status: None   Collection Time: 10/25/20 11:00 AM   Specimen: Nasopharyngeal Swab  Result Value Ref Range Status   SARS Coronavirus 2 NEGATIVE NEGATIVE Final    Comment: (NOTE) SARS-CoV-2 target nucleic acids are NOT DETECTED.  The SARS-CoV-2 RNA is generally  detectable in upper and lower respiratory specimens during the acute phase of infection. Negative results do not preclude SARS-CoV-2 infection, do not rule out co-infections with other pathogens, and should not be used as the sole basis for treatment or other patient management decisions. Negative results must be combined with clinical observations, patient history, and epidemiological information. The expected result is Negative.  Fact Sheet for Patients: HairSlick.nohttps://www.fda.gov/media/138098/download  Fact Sheet for Healthcare Providers: quierodirigir.comhttps://www.fda.gov/media/138095/download  This test is not yet approved or cleared by the [place] FDA and  has been authorized for detection and/or diagnosis of SARS-CoV-2 by FDA under an Emergency Use Authorization (EUA). This EUA will remain  in effect (meaning this test can be used) for  the duration of the COVID-19 declaration under Se ction 564(b)(1) of the Act, 21 U.S.C. section 360bbb-3(b)(1), unless the authorization is terminated or revoked sooner.  Performed at [facility], [address], [place], [place] [address]   KOH prep     Status: None   Collection Time: 10/27/20 12:12 PM   Specimen: Esophagus  Result Value Ref Range Status   Specimen Description ESOPHAGUS  Final   Special Requests Normal  Final   KOH Prep   Final    YEAST WITH PSEUDOHYPHAE Performed at [facility], [address], [place], [place] [address]    Report Status 10/27/2020 FINAL  Final  MRSA Next Gen by PCR, Nasal     Status: Abnormal   Collection Time: 10/28/20  8:46 PM   Specimen: Nasal Mucosa; Nasal Swab  Result Value Ref Range Status   MRSA by PCR Next Gen DETECTED (A) NOT DETECTED Final    Comment: RESULT CALLED TO, READ BACK BY AND VERIFIED WITH: MARCELL TURNER @2215  10/28/2020 LFD (NOTE) The GeneXpert MRSA Assay (FDA approved for NASAL specimens only), is one component of a comprehensive MRSA colonization surveillance program.  It is not intended to diagnose MRSA infection nor to guide or monitor treatment for MRSA infections. Test performance is not FDA approved in patients less than 9 years old. Performed at [facility], [address] [place], [place]       Radiology Studies: No results found.  Scheduled Meds:  ascorbic acid  500 mg Oral Daily   benzonatate  100 mg Oral TID   carbidopa-levodopa  1 tablet Oral TID   Chlorhexidine Gluconate Cloth  6 each Topical Daily   diclofenac Sodium  4 g Topical QID   escitalopram  5 mg Oral Daily   feeding supplement (GLUCERNA SHAKE)  237 mL Oral BID BM   ferrous sulfate  325 mg Oral TID WC   finasteride  5 mg Oral Daily   fluticasone  1 spray Each Nare Daily   fluticasone furoate-vilanterol  1 puff Inhalation Daily   furosemide  40 mg Oral Daily   gabapentin  800 mg Oral BID   guaiFENesin  600 mg Oral Q12H   insulin aspart  0-20 Units Subcutaneous TID WC   insulin aspart  0-5 Units Subcutaneous QHS   insulin aspart  4 Units Subcutaneous TID WC   ipratropium  1 spray Each Nare BID   lactase  3,000 Units Oral TID WC   lactulose  30 g Oral Daily   lidocaine  1 patch Transdermal Q24H   loratadine  10 mg Oral Daily   methocarbamol  500 mg Oral TID   metoprolol succinate  50 mg Oral Daily   montelukast  10 mg Oral QHS   mupirocin ointment  1 application Nasal BID   pantoprazole  40 mg Oral BID AC   predniSONE  50 mg Oral Q breakfast   sodium chloride flush  10-40 mL Intracatheter Q12H   tamsulosin  0.4 mg Oral Daily   traZODone  100 mg Oral QHS   umeclidinium bromide  1 puff Inhalation Daily   Continuous Infusions:  sodium chloride 10 mL/hr at 10/28/20 1715   cefTRIAXone (ROCEPHIN)  IV 2 g (10/31/20 0926)     LOS: 15 days   Time spent: 33 minutes. More than 50% of the time was spent in counseling/coordination of care  12/31/20, MD [facility]  If 7PM-7AM, please contact night-coverage [email or web address]  10/31/2020,  2:37 PM   This record has been created using Conservation officer, historic buildings. Errors have been sought and corrected,but may not always be located. Such creation errors do not reflect on the standard of care.

## 2020-10-31 NOTE — Progress Notes (Signed)
Urology was asked to reassess the patient given reports of ongoing urinary leakage from his penis.  The leakage is intermittent, worse at night, and is not associated with decreased drainage from his suprapubic catheter.  Nursing was able to irrigate the suprapubic catheter at the bedside today without difficulty and did not notice urine leaking from the penis or around the suprapubic tube during this procedure.  On rounding today, his catheter was noted to be draining clear, yellow urine without difficulty  Findings above are consistent with bladder spasms, and management options are limited to pharmacotherapy.  I recommend starting him on oxybutynin XL 10 mg daily moving forward to manage his bladder spasms.  Patient may follow-up outpatient at the [facility] as previously noted.  I shared our findings and recommendations with the patient at the bedside today.  All questions answered.  Patient was in agreement with this plan.  Carman Ching, PA-C 10/31/20 6:08 PM

## 2020-11-01 DIAGNOSIS — A408 Other streptococcal sepsis: Secondary | ICD-10-CM | POA: Diagnosis not present

## 2020-11-01 DIAGNOSIS — R6520 Severe sepsis without septic shock: Secondary | ICD-10-CM | POA: Diagnosis not present

## 2020-11-01 DIAGNOSIS — N179 Acute kidney failure, unspecified: Secondary | ICD-10-CM | POA: Diagnosis not present

## 2020-11-01 LAB — RESP PANEL BY RT-PCR (FLU A&B, COVID) ARPGX2
Influenza A by PCR: NEGATIVE
Influenza B by PCR: NEGATIVE
SARS Coronavirus 2 by RT PCR: NEGATIVE

## 2020-11-01 LAB — CBC
HCT: 26.9 % — ABNORMAL LOW (ref 39.0–52.0)
Hemoglobin: 8.5 g/dL — ABNORMAL LOW (ref 13.0–17.0)
MCH: 27.4 pg (ref 26.0–34.0)
MCHC: 31.6 g/dL (ref 30.0–36.0)
MCV: 86.8 fL (ref 80.0–100.0)
Platelets: 388 10*3/uL (ref 150–400)
RBC: 3.1 MIL/uL — ABNORMAL LOW (ref 4.22–5.81)
RDW: 15.2 % (ref 11.5–15.5)
WBC: 8.5 10*3/uL (ref 4.0–10.5)
nRBC: 0 % (ref 0.0–0.2)

## 2020-11-01 LAB — BASIC METABOLIC PANEL
Anion gap: 9 (ref 5–15)
BUN: 30 mg/dL — ABNORMAL HIGH (ref 8–23)
CO2: 30 mmol/L (ref 22–32)
Calcium: 8.8 mg/dL — ABNORMAL LOW (ref 8.9–10.3)
Chloride: 96 mmol/L — ABNORMAL LOW (ref 98–111)
Creatinine, Ser: 0.97 mg/dL (ref 0.61–1.24)
GFR, Estimated: >60 mL/min (ref >60)
Glucose, Bld: 120 mg/dL — ABNORMAL HIGH (ref 70–99)
Potassium: 4 mmol/L (ref 3.5–5.1)
Sodium: 135 mmol/L (ref 135–145)

## 2020-11-01 LAB — GLUCOSE, CAPILLARY
Glucose-Capillary: 113 mg/dL — ABNORMAL HIGH (ref 70–99)
Glucose-Capillary: 287 mg/dL — ABNORMAL HIGH (ref 70–99)

## 2020-11-01 MED ORDER — FLUCONAZOLE 200 MG PO TABS: 400.0000 mg | ORAL_TABLET | Freq: Every day | ORAL | 0 refills | Status: AC

## 2020-11-01 MED ORDER — PANTOPRAZOLE SODIUM 40 MG PO TBEC: 40.0000 mg | DELAYED_RELEASE_TABLET | Freq: Two times a day (BID) | ORAL | 2 refills | Status: DC

## 2020-11-01 MED ORDER — FERROUS SULFATE 325 (65 FE) MG PO TABS: 325.0000 mg | ORAL_TABLET | Freq: Three times a day (TID) | ORAL | 3 refills | Status: DC

## 2020-11-01 MED ORDER — FLUCONAZOLE 100 MG PO TABS
400.0000 mg | ORAL_TABLET | Freq: Every day | ORAL | Status: DC
  Filled 2020-11-01: qty 4

## 2020-11-01 MED ORDER — TRAMADOL HCL 50 MG PO TABS: 100.0000 mg | ORAL_TABLET | Freq: Three times a day (TID) | ORAL | 0 refills | Status: AC | PRN

## 2020-11-01 MED ORDER — OXYBUTYNIN CHLORIDE ER 5 MG PO TB24: 10.0000 mg | ORAL_TABLET | Freq: Every day | ORAL | Status: DC

## 2020-11-01 MED ORDER — GLUCERNA SHAKE PO LIQD: 237.0000 mL | Freq: Two times a day (BID) | ORAL | 0 refills | Status: DC

## 2020-11-01 MED ORDER — CEFTRIAXONE IV (FOR PTA / DISCHARGE USE ONLY): 2.0000 g | Freq: Two times a day (BID) | INTRAVENOUS | 0 refills | Status: AC

## 2020-11-01 MED ORDER — OXYBUTYNIN CHLORIDE ER 10 MG PO TB24: 10.0000 mg | ORAL_TABLET | Freq: Every day | ORAL | Status: DC

## 2020-11-01 NOTE — Progress Notes (Signed)
PHARMACY CONSULT NOTE FOR:  OUTPATIENT  PARENTERAL ANTIBIOTIC THERAPY (OPAT)  Indication: Group C streptococcus bacteremia with possible skull base infection (Skull base infection new finding on MRI 7/29 thus dose and duration changed for antibiotic) Regimen: Ceftriaxone 2gm IV q12h End date: 12/03/2020  IV antibiotic discharge orders are pended. To discharging provider:  please sign these orders via discharge navigator,  Select New Orders & click on the button choice - Manage This Unsigned Work.     Thank you for allowing pharmacy to be a part of this patient's care.  Juliette Alcide, PharmD, BCPS.   Work Cell: [phone] 11/01/2020 9:51 AM

## 2020-11-01 NOTE — TOC Progression Note (Signed)
Transition of Care [facility]) - Progression Note    Patient Details  Name: Raymond Hays MRN: [id number] Date of Birth: 09/27/1945  Transition of [facility]) CM/SW Contact  Barrie Dunker, RN Phone Number: 11/01/2020, 1:14 PM  Clinical Narrative:     Rhina Brackett DC summary to [facility] [place] to transport, The patient resides at [facility], He is alert and oriented and will notify family        Expected Discharge Plan and Services           Expected Discharge Date: 11/01/20                                     Social Determinants of Health (SDOH) Interventions    Readmission Risk Interventions Readmission Risk Prevention Plan 10/20/2020  Transportation Screening Complete  PCP or Specialist Appt within 3-5 Days Complete  HRI or Home Care Consult Complete  Palliative Care Screening Not Applicable  Medication Review (RN Care Manager) Referral to Pharmacy

## 2020-11-01 NOTE — Discharge Summary (Addendum)
Physician Discharge Summary   Raymond Hays  male DOB: 08-31-1945  IRC:[id number]  PCP: [facility]  Admit date: 10/16/2020 Discharge date: 11/01/2020  Admitted From: SNF Disposition:  SNF CODE STATUS: Full code  Discharge Instructions     Discharge instructions   Complete by: As directed    Discharge antibiotics: ceftriaxone 2 grams IV q 12 hours until 12/03/20   [facility] Care Per Protocol:including placement of biopatch   Labs weekly on Monday while on IV antibiotics: CBC with differential CMP ESR CRP  Fax weekly labs to [phone]  Please pull PIC at completion of IV antibiotics  Clinic Follow Up Appt: 8/25//22 at 11am with Dr.Ravishankar   Call [phone] with any questions - -   Home infusion instructions   Complete by: As directed    Instructions: Flushing of vascular access device: 0.9% NaCl pre/post medication administration and prn patency; Heparin 100 u/ml, 38m for implanted ports and Heparin 10u/ml, 561mfor all other central venous catheters.        Hospital Course:  For full details, please see H&P, progress notes, consult notes and ancillary notes.  Briefly,  Raymond Mickelsons a [age].o. Caucasian male with medical history significant for type 2 diabetes mellitus, hypertension, COPD, and anxiety, who presented [facility] ER [facility] with sepsis secondary to group C streptococcus bacteremia.  Started on Rocephin.  TTE is without any concern of endocarditis.  Repeat blood cultures on 10/19/2020 negative. Patient had neck, right knee and right elbow pain.  Orthopedic surgery did not believe the right knee or elbow was septic.  CT cervical spine did not show any osteomyelitis but does have old hardware there.  The patient developed ileus during the hospital course.  Patient finally had a bowel movement on 10/24/2020 and diet advanced to solid food.  PICC line placed.     Neurosurgery was  consulted due to worsening neck pain and an MRI was done with and without contrast which shows some concern of edema/possible early infection at the base of skull and involving C1-C3, no intervention needed per neurosurgery at this time, need prolonged duration of antibiotics.  Sepsis secondary to group C streptococcal bacteremia.   Per ID, will need ceftriaxone 2 grams IV q 12 hours until 12/03/20.   Neck pain.  Improving. Neurosurgery was also consulted and MRI was done with concern of some edema and possible signs of early infection involving C1-C3 and the base of skull. No need for any surgical intervention at this time.   --Will treat with prolong course of abx as above.   --Pt received increased dose of prednisone 50 mg daily, and is discharged on home prednisone. --tramadol 100 mg q8h PRN for pain. --Continue home Robaxin --cont diclofenac gel  Ileus.  Resolved having multiple bowel movements. --cont home bowel regimen.  D/c home Imodium.   Concern of melena.   -GI was consulted-patient underwent EGD and sigmoidoscopy, found to have a nonbleeding duodenal ulcer. --Eliquis held for 5 days, and is resumed at discharge. --twice daily PPI for 8 weeks.  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  Started on fluconazole.  Will finish 7-day course, per ID rec.    COPD exacerbation.  Resolved.  Completed a course of steroid. --Continue with home daily bronchodilators.   Chronic suprapubic catheter.   Changed by urology on 10/23/2020.  Concern of urinary leakage through penis.  Urology consulted and believed pt  is having some bladder spasms which can be managed medically.  Oxybutynin XL 10 mg daily started.   -Continue cath changing monthly. -Outpatient urology follow-up   Right knee pain and right elbow pain.  No signs of septic joints as per orthopedic surgery. -Right knee CT scan was without any significant new abnormality or infection. - Diclofenac gel ordered for right knee.    AKI.  Resolved Cr 1.61 on presentation, 0.97 prior to discharge.  Parkinson's disease. -Continue Sinemet.  Paroxysmal atrial fibrillation. -Continue with metoprolol --Eliquis held for 5 days, can be resumed on 11/02/2020.  Type 2 diabetes mellitus with neuropathy.   A1c of 6.6.  CBG elevated as patient received steroids. --discharged back on home metformin.  --Continue with gabapentin   Essential hypertension.   Blood pressure within goal. -Continue with metoprolol  Generalized weakness. -PT is recommending SNF placement-patient will go back to his facility.   Discharge Diagnoses:  Active Problems:   AF (paroxysmal atrial fibrillation) (HCC)   Acute pain of right knee   COPD with acute exacerbation (HCC)   Sepsis (HCC)   Neck pain   AKI (acute kidney injury) ([place])   Type 2 diabetes mellitus with diabetic neuropathy, without long-term current use of insulin ([place])   Parkinson disease ([place])   Ileus ([place])   30 Day Unplanned Readmission Risk Score    Flowsheet Row ED to Hosp-Admission (Current) from 10/16/2020 in [place] (1A)  30 Day Unplanned Readmission Risk Score (%) 28.63 Filed at 11/01/2020 0801       This score is the patient's risk of an unplanned readmission within 30 days of being discharged (0 -100%). The score is based on dignosis, age, lab data, medications, orders, and past utilization.   Low:  0-14.9   Medium: 15-21.9   High: 22-29.9   Extreme: 30 and above         Discharge Instructions:  Allergies as of 11/01/2020   No Known Allergies      Medication List     STOP taking these medications    ascorbic acid 500 MG tablet Commonly known as: VITAMIN C   benzonatate 100 MG capsule Commonly known as: TESSALON   Breztri Aerosphere 160-9-4.8 MCG/ACT Aero Generic drug: Budeson-Glycopyrrol-Formoterol   furosemide 20 MG tablet Commonly known as: LASIX   guaiFENesin 600 MG 12 hr tablet Commonly known as:  MUCINEX   hydrocortisone 2.5 % rectal cream Commonly known as: ANUSOL-HC   hydrocortisone cream 0.5 %   ibuprofen 200 MG tablet Commonly known as: ADVIL   lisinopril 2.5 MG tablet Commonly known as: ZESTRIL   loperamide 2 MG capsule Commonly known as: IMODIUM   nitrofurantoin (macrocrystal-monohydrate) 100 MG capsule Commonly known as: MACROBID       TAKE these medications    acetaminophen 325 MG tablet Commonly known as: TYLENOL Take 650 mg by mouth every 4 (four) hours as needed.   Albuterol Sulfate 108 (90 Base) MCG/ACT Aepb Commonly known as: PROAIR RESPICLICK Inhale 2 puffs into the lungs every 4 (four) hours as needed (COPD).   Anti-Dandruff 1 % Lotn Generic drug: selenium sulfide Apply topically.   apixaban 5 MG Tabs tablet Commonly known as: ELIQUIS Take 5 mg by mouth 2 (two) times daily.   atorvastatin 20 MG tablet Commonly known as: LIPITOR Take 20 mg by mouth daily.   Benadryl Itch Stopping cream Generic drug: diphenhydrAMINE-zinc acetate Apply topically every 4 (four) hours.   bisacodyl 10 MG suppository Commonly known as: DULCOLAX  Place 10 mg rectally as needed for moderate constipation.   calcium carbonate 500 MG chewable tablet Commonly known as: TUMS - dosed in mg elemental calcium Chew 2 tablets by mouth every 6 (six) hours as needed for indigestion or heartburn.   carbidopa-levodopa 25-100 MG tablet Commonly known as: SINEMET IR Take 1 tablet by mouth 3 (three) times daily before meals. At 0630, 1130, 1630   carboxymethylcellulose 0.5 % Soln Commonly known as: REFRESH PLUS 1 drop as needed.   cefTRIAXone  IVPB Commonly known as: ROCEPHIN Inject 2 g into the vein every 12 (twelve) hours. Indication:  Group C streptococcus bacteremia with possible skull base infection Last Day of Therapy:  12/03/2020 Labs - Once weekly:  CBC/D, CMP, ESR and CRP Removed PICC upon completion of antibiotics Fax weekly labs to Dr Delaine Lame [phone] Method of administration may be changed at the discretion of home infusion pharmacist based upon assessment of the patient and/or caregiver's ability to self-administer the medication ordered.   cetaphil cream Apply 1 application topically as needed.   Eucerin Daily Protection/SPF30 Lotn Apply topically as needed.   cetirizine 10 MG tablet Commonly known as: ZYRTEC Take 10 mg by mouth daily.   clobetasol cream 0.05 % Commonly known as: TEMOVATE Apply topically.   diclofenac Sodium 1 % Gel Commonly known as: VOLTAREN Apply 4 g topically 4 (four) times daily.   escitalopram 5 MG tablet Commonly known as: LEXAPRO Take 5 mg by mouth daily.   feeding supplement (GLUCERNA SHAKE) Liqd Take 237 mLs by mouth 2 (two) times daily between meals.   ferrous sulfate 325 (65 FE) MG tablet Take 1 tablet (325 mg total) by mouth 3 (three) times daily with meals.   finasteride 5 MG tablet Commonly known as: PROSCAR Take 5 mg by mouth daily.   fluconazole 200 MG tablet Commonly known as: DIFLUCAN Take 2 tablets (400 mg total) by mouth daily for 6 days. Start taking on: November 02, 2020   fluticasone 50 MCG/ACT nasal spray Commonly known as: FLONASE Place 1 spray into both nostrils daily.   gabapentin 800 MG tablet Commonly known as: NEURONTIN Take 800 mg by mouth 2 (two) times daily.   hydrOXYzine 25 MG tablet Commonly known as: ATARAX/VISTARIL Take 25 mg by mouth 2 (two) times daily as needed for itching.   ipratropium 0.03 % nasal spray Commonly known as: ATROVENT Place 2 sprays into both nostrils 2 (two) times daily. What changed: Another medication with the same name was removed. Continue taking this medication, and follow the directions you see here.   ipratropium-albuterol 0.5-2.5 (3) MG/3ML Soln Commonly known as: DUONEB Inhale 3 mLs into the lungs daily. And every 6 hours as needed for shortness of breath or wheezing   lactase 3000 units tablet Commonly known  as: LACTAID Take 3,000 Units by mouth 3 (three) times daily with meals.   Lidocaine 3 % Crea Apply topically daily as needed.   metFORMIN 500 MG 24 hr tablet Commonly known as: GLUCOPHAGE-XR Take 500 mg by mouth daily.   methocarbamol 500 MG tablet Commonly known as: ROBAXIN Take 1,000 mg by mouth in the morning and at bedtime.   metoprolol succinate 50 MG 24 hr tablet Commonly known as: TOPROL-XL Take 50 mg by mouth daily.   MINERIN CREME EX Apply topically. Apply to feet twice bid   montelukast 10 MG tablet Commonly known as: SINGULAIR Take 10 mg by mouth at bedtime.   nystatin powder Commonly known as: MYCOSTATIN/NYSTOP  oxybutynin 10 MG 24 hr tablet Commonly known as: DITROPAN-XL Take 1 tablet (10 mg total) by mouth daily.   pantoprazole 40 MG tablet Commonly known as: PROTONIX Take 1 tablet (40 mg total) by mouth 2 (two) times daily before a meal. For 8 weeks.   polyethylene glycol 17 g packet Commonly known as: MIRALAX / GLYCOLAX Take 17 g by mouth daily.   predniSONE 20 MG tablet Commonly known as: DELTASONE Take 20 mg by mouth daily with breakfast.   psyllium 58.6 % powder Commonly known as: METAMUCIL Take 1 packet by mouth daily.   Sarna lotion Generic drug: camphor-menthol Apply topically.   senna-docusate 8.6-50 MG tablet Commonly known as: Senokot-S Take by mouth.   simethicone 125 MG chewable tablet Commonly known as: MYLICON Chew 952 mg by mouth 3 (three) times daily.   STOOL SOFTENER LAXATIVE PO Take 1 tablet by mouth in the morning and at bedtime.   tamsulosin 0.4 MG Caps capsule Commonly known as: FLOMAX Take 0.4 mg by mouth 2 (two) times daily.   torsemide 10 MG tablet Commonly known as: DEMADEX Take 10 mg by mouth daily.   traMADol 50 MG tablet Commonly known as: ULTRAM Take 2 tablets (100 mg total) by mouth every 8 (eight) hours as needed for up to 5 days. What changed:  when to take this reasons to take this    traZODone 100 MG tablet Commonly known as: DESYREL Take 100 mg by mouth at bedtime.   Trelegy Ellipta 100-62.5-25 MCG/INH Aepb Generic drug: Fluticasone-Umeclidin-Vilant Inhale 1 puff into the lungs daily.   triamcinolone cream 0.1 % Commonly known as: KENALOG Apply 1 application topically 2 (two) times daily.               Home Infusion Instuctions  (From admission, onward)           Start     Ordered   11/01/20 0000  Home infusion instructions       Question:  Instructions  Answer:  Flushing of vascular access device: 0.9% NaCl pre/post medication administration and prn patency; Heparin 100 u/ml, 4m for implanted ports and Heparin 10u/ml, 576mfor all other central venous catheters.   11/01/20 1119             Contact information for follow-up providers     RaTsosie BillingMD Follow up on 11/23/2020.   Specialty: Infectious Diseases Why: 11 am. Contact information: [address] [phone]            Contact information for after-discharge care     [facility]   Service: Skilled Nursing Contact information: [phone]                   No Known Allergies   The results of significant diagnostics from this hospitalization (including imaging, microbiology, ancillary and laboratory) are listed below for reference.   Consultations:   Procedures/Studies: CT ABDOMEN PELVIS WO CONTRAST  Result Date: 10/23/2020 CLINICAL DATA:  [age]ear old male with concern for bowel obstruction. EXAM: CT ABDOMEN AND PELVIS WITHOUT CONTRAST TECHNIQUE: Multidetector CT imaging of the abdomen and pelvis was performed following the standard protocol without IV contrast. COMPARISON:  Abdominal radiograph dated 10/23/2020. FINDINGS: Evaluation of this exam is limited in the absence of intravenous contrast. Lower  chest: Trace bilateral pleural effusions with minimal bibasilar dependent atelectasis. There  is coronary vascular calcification. No intra-abdominal free air or free fluid. Hepatobiliary: There is a 2 cm right liver hypodense lesion which is suboptimally characterized on this CT but demonstrates fluid attenuation most consistent with a cyst. No intrahepatic biliary ductal dilatation. Small gallstone. No pericholecystic fluid or evidence of acute cholecystitis by CT. Pancreas: Unremarkable. No pancreatic ductal dilatation or surrounding inflammatory changes. Spleen: Normal in size without focal abnormality. Adrenals/Urinary Tract: The adrenal glands unremarkable. Mild bilateral renal parenchyma atrophy. There is a punctate nonobstructing left renal inferior pole calculus. No hydronephrosis. There is no hydronephrosis or nephrolithiasis on the right. A 16 mm exophytic hypodense lesion from the posterior interpolar left kidney demonstrates fluid attenuation most consistent with cysts. Several additional smaller hypodense lesions are not characterized on this noncontrast CT. The visualized ureters appear unremarkable. The urinary bladder is decompressed around a suprapubic catheter. Small amount of air within the bladder likely introduced via the catheter. Stomach/Bowel: There is sigmoid diverticulosis without active inflammatory changes. There is moderate amount of stool throughout the colon. There is no bowel obstruction or active inflammation. Appendectomy. Vascular/Lymphatic: Mild aortoiliac atherosclerotic disease. The IVC is unremarkable. No portal venous gas. There is no adenopathy. Reproductive: The prostate gland is poorly visualized but grossly unremarkable. Other: Mild diffuse subcutaneous edema of the pelvis. No fluid collection. Musculoskeletal: Total bilateral hip arthroplasties. There is no acute fracture or dislocation. There is advanced osteopenia. Extensive degenerative changes of the spine. IMPRESSION:  1. No acute intra-abdominal or pelvic pathology. No bowel obstruction. 2. Sigmoid diverticulosis. 3. Cholelithiasis. 4. Aortic Atherosclerosis (ICD10-I70.0). Electronically Signed   By: Anner Crete M.D.   On: 10/23/2020 21:52   DG Cervical Spine 2 or 3 views  Result Date: 10/17/2020 CLINICAL DATA:  Neck pain. EXAM: CERVICAL SPINE - 2-3 VIEW COMPARISON:  No prior. FINDINGS: Very limited exam due to positioning and motion artifact. Postsurgical changes cervical spine consistent prior anterior posterior fusion and mid cervical spine corpectomy. Hardware intact. Anatomic alignment. Severe osteopenia and degenerative change cervical spine. No acute bony abnormality identified. Probable carotid vascular calcification. IMPRESSION: 1. Very limited exam due to positioning and motion artifact. Postsurgical changes cervical spine. Hardware intact. Severe osteopenia and degenerative change cervical spine. 2.  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 Electronically Signed   By: Marcello Moores  Register   On: 10/17/2020 11:33   DG Abd 1 View  Result Date: 10/21/2020 CLINICAL DATA:  Intractable vomiting with nausea. EXAM: ABDOMEN - 1 VIEW COMPARISON:  None. FINDINGS: Supine KUB shows fairly marked gaseous distention of the stomach. There is diffuse gaseous distension of small bowel with air and stool scattered along the length of the colon. Bones are diffusely demineralized with degenerative changes in the lumbar spine. Patient is status post bilateral hip replacement. IMPRESSION: Marked gaseous distention of the stomach with diffuse gaseous distention of small bowel. Imaging features are compatible with ileus. While bowel gas pattern does not appear frankly obstructive, follow-up imaging may be warranted. Electronically Signed   By: Misty Stanley M.D.   On: 10/21/2020 05:47   CT Head Wo Contrast  Result Date: 10/16/2020 CLINICAL DATA:  Weakness for 4 days. Altered mental status. Disoriented EXAM: CT HEAD WITHOUT CONTRAST  TECHNIQUE: Contiguous axial images were obtained from the base of the skull through the vertex without intravenous contrast. COMPARISON:  None. FINDINGS: Brain: Age related atrophy. No intracranial hemorrhage, mass effect, or midline shift. No hydrocephalus. The basilar cisterns are patent. Moderate periventricular and deep white matter hypodensity consistent with chronic small vessel ischemia. No  evidence of territorial infarct or acute ischemia. No extra-axial or intracranial fluid collection. Vascular: Atherosclerosis of skullbase vasculature without hyperdense vessel or abnormal calcification. Skull: No fracture or focal lesion. Sinuses/Orbits: Paranasal sinuses and mastoid air cells are clear. The visualized orbits are unremarkable. Other: There is prominent degenerative pannus at C1-C2 which causes mild mass effect and narrowing of the cranial cervical junction, only partially included in the field of view. IMPRESSION: 1. No acute intracranial abnormality. 2. Age related atrophy and chronic small vessel ischemia. 3. Prominent degenerative pannus at C1-C2 which causes mild mass effect and narrowing of the cranial cervical junction, only partially included in the field of view. Electronically Signed   By: Keith Rake M.D.   On: 10/16/2020 18:22   CT Chest Wo Contrast  Result Date: 10/16/2020 CLINICAL DATA:  Cough and leukocytosis. EXAM: CT CHEST WITHOUT CONTRAST TECHNIQUE: Multidetector CT imaging of the chest was performed following the standard protocol without IV contrast. COMPARISON:  Radiograph earlier today.  Chest CTA 12/28/2019 FINDINGS: Cardiovascular: Aortic atherosclerosis and tortuosity. No aortic aneurysm. No periaortic stranding. Mild dilatation of the central pulmonary artery at 3.6 cm. Cardiomegaly with advanced coronary artery calcifications. Presumed planted metallic densities in the left ventricle. No significant pericardial effusion. Small amount of fluid in the superior pericardial  recess. Mediastinum/Nodes: No enlarged mediastinal lymph nodes. Limited assessment for hilar adenopathy in this unenhanced exam. Tiny hiatal hernia. No suspicious thyroid nodule. Lungs/Pleura: No consolidation to suggest pneumonia. Dependent atelectasis in the right greater than left lower lobe. No septal thickening or pulmonary edema. No pleural fluid. No mass or suspicious nodule. Trachea and central bronchi are patent. Upper Abdomen: Low-density lesion in the right lobe of the liver is similar to prior exam, incompletely characterized on this unenhanced exam. Gallstone noted without pericholecystic fat stranding. No acute findings in the upper abdomen. Musculoskeletal: Diffuse thoracic spondylosis and degenerative disc disease. Advanced degenerative change of both shoulders. Similar sclerotic appearance of the posterior right fifth rib, stable in appearance similar but less pronounced appearance of the left eighth and eleventh ribs. No new osseous findings. No chest wall soft tissue abnormality. IMPRESSION: 1. No evidence of pneumonia. Dependent atelectasis in the right greater than left lower lobe. 2. Cardiomegaly with advanced coronary artery calcifications. 3. Mild dilatation of the central pulmonary artery, can be seen with pulmonary arterial hypertension. 4. Cholelithiasis incidentally noted in the upper abdomen. 5. Sclerotic cortical thickening of bilateral ribs is unchanged from September of 2021 exam. Findings may be related to Paget's disease, prior injury, or sclerotic metastasis. Overall stability over 10 months. Aortic Atherosclerosis (ICD10-I70.0). Electronically Signed   By: Keith Rake M.D.   On: 10/16/2020 18:29   CT CERVICAL SPINE W CONTRAST  Result Date: 10/26/2020 CLINICAL DATA:  Neck pain, acute, infection suspected. Patient with Gp C strep bactremia, neck hardware with worsening pain. EXAM: CT CERVICAL SPINE WITH CONTRAST TECHNIQUE: Multidetector CT imaging of the cervical spine was  performed during intravenous contrast administration. Multiplanar CT image reconstructions were also generated. CONTRAST:  63m OMNIPAQUE IOHEXOL 350 MG/ML SOLN COMPARISON:  10/18/2020 FINDINGS: Alignment: Unchanged cervical spine straightening. No significant listhesis. Skull base and vertebrae: Postsurgical changes from C4-5 corpectomy, C3-C6 anterior fusion, and C3-C7 posterior fusion and laminectomies as previously detailed. Remote, solid interbody fusion at C6-7. Advanced C1-2 arthropathy with unchanged mild basilar impression. No interval osseous destruction to suggest osteomyelitis. No acute fracture. Soft tissues and spinal canal: Limited assessment of the spinal canal due to streak artifact. No gross prevertebral fluid  or significant prevertebral soft tissue swelling. Disc levels: Unchanged appearance of postoperative and degenerative changes in the cervical and included upper thoracic spine with multilevel neural foraminal stenosis as detailed on the recent prior CT. Upper chest: Clear lung apices. Other: Extensive motion artifact through the pharynx and larynx. IMPRESSION: No acute osseous abnormality identified in the cervical spine. Limited assessment of the spinal canal due to artifact. MRI would provide much better assessment for epidural abscess or early changes of discitis-osteomyelitis. Electronically Signed   By: Logan Bores M.D.   On: 10/26/2020 14:06   CT CERVICAL SPINE W CONTRAST  Result Date: 10/19/2020 CLINICAL DATA:  Acute neck pain.  Infection suspected. EXAM: CT CERVICAL SPINE WITH CONTRAST TECHNIQUE: Multidetector CT imaging of the cervical spine was performed during intravenous contrast administration. Multiplanar CT image reconstructions were also generated. CONTRAST:  87m OMNIPAQUE IOHEXOL 300 MG/ML  SOLN COMPARISON:  Cervical spine radiographs 10/17/2020. [facility] FINDINGS: Alignment: Cervical spine straightening.  No significant  listhesis. Skull base and vertebrae: Extensive postsurgical changes from C4-5 corpectomy, C3-C6 anterior fusion, and C3-C7 posterior fusion with associated laminectomies. There is evidence of solid anterior and posterior osseous fusion at the operative levels. No lucency is seen about the screws to suggest loosening or infection. Within limitations of artifact from the hardware and image noise, no acute fracture or destructive osseous process is identified. There is asymmetrically severe right lateral and median C1-2 arthropathy with mild basilar impression which was also described in the prior outside CT report. Soft tissues and spinal canal: No evidence of significant prevertebral soft tissue swelling or fluid within limitations of streak artifact. Poor assessment of the spinal canal due to artifact. Disc levels: Calcified pannus at C1-2 result in mild spinal stenosis. There is advanced neural foraminal stenosis on the left at C2-3, on the right at C3-4, and bilaterally at C4-5 and C5-6 due to uncovertebral and facet spurring. At C7-T1, there is moderate disc space narrowing with uncovertebral and facet spurring resulting in moderate bilateral neural foraminal stenosis. At T1-2, there is severe disc space narrowing with endplate and facet spurring resulting in moderate to severe bilateral neural foraminal stenosis. Upper chest: Clear lung apices. Other: Prominent motion artifact through the pharynx, larynx, and trachea. IMPRESSION: 1. No acute osseous abnormality identified in the cervical spine. 2. Extensive postsurgical changes from C3-C6 anterior and C3-C7 posterior fusion. 3. 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 Electronically Signed   By: ALogan BoresM.D.   On: 10/19/2020 08:28   CT KNEE RIGHT W CONTRAST  Result Date: 10/26/2020 CLINICAL DATA:  Right knee pain and spasms. Diabetic patient with streptococcal  bacteremia and sepsis. EXAM: CT OF THE RIGHT KNEE WITH CONTRAST TECHNIQUE: Multidetector CT imaging was performed following the standard protocol during bolus administration of intravenous contrast. CONTRAST:  75 mL OMNIPAQUE IOHEXOL 350 MG/ML SOLN COMPARISON:  None. FINDINGS: Bones/Joint/Cartilage The exam is degraded by patient motion despite repeat imaging. No fracture or focal bone lesion is seen. No joint effusion is identified. Chondrocalcinosis of the menisci is noted. Ligaments Suboptimally assessed by CT. Muscles and Tendons Grossly intact. Soft tissues There is some subcutaneous edema about the knee and visualized lower leg, worse on the lower leg. No focal fluid collection. No soft tissue gas. No radiopaque foreign body. IMPRESSION: Motion degraded exam. No acute bony or joint abnormality is identified. Subcutaneous edema about the knee and lower  leg could be due to dependent change or cellulitis. No focal fluid collection is seen. Electronically Signed   By: Inge Rise M.D.   On: 10/26/2020 15:37   MR CERVICAL SPINE W WO CONTRAST  Result Date: 10/27/2020 CLINICAL DATA:  Initial evaluation for neck pain in the setting of sepsis and bacteremia, myelopathy. History of prior fusion. EXAM: MRI CERVICAL SPINE WITHOUT AND WITH CONTRAST TECHNIQUE: Multiplanar and multiecho pulse sequences of the cervical spine, to include the craniocervical junction and cervicothoracic junction, were obtained without and with intravenous contrast. CONTRAST:  81m GADAVIST GADOBUTROL 1 MMOL/ML IV SOLN COMPARISON:  Prior CT from earlier the same day. FINDINGS: Alignment: Straightening of the normal cervical lordosis, stable. Trace chronic anterolisthesis of C7 on T1. Vertebrae: Susceptibility artifact from prior C4-5 corpectomy with C3-C6 anterior fusion, and C3-C7 posterior fusion. Hardware better evaluated on prior CT. Vertebral body height maintained without acute or chronic fracture. Underlying bone marrow signal  intensity within normal limits. No worrisome osseous lesions. 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 Associated mild reactive marrow edema involving the right lateral masses of C1 and C2 as well as the dens and anterior arch of C1. There is prominent soft tissue thickening with prevertebral edema and enhancement extending from the inferior aspect of the clivus to the C2-3 level (series 16, image 9) finding is nonspecific, but raises the possibility for skull base infection given the history of sepsis and bacteremia. Possible tiny 5 mm collection at this level (series 16, image 7). No other evidence for osteomyelitis discitis or septic arthritis within the cervical spine. Cord: Normal signal and morphology. No epidural abscess or other collection. Posterior Fossa, vertebral arteries, paraspinal tissues: Visualized brain and posterior fossa within normal limits. Prominent degenerative thickening at the dens/tectorial membrane with associated moderate stenosis at the cervicomedullary junction. Prevertebral edema and enhancement extending from the clivus through the C2-3 level as above. Additional mild edema and enhancement involving the posterior suboccipital soft tissues at the level of the posterior arch of C1 as well. There is a superimposed curvilinear rim enhancing collection within this region, closely approximating the posterior arch of C1 (series 16, images 5-17). This is also seen on prior CT (series 5, image 18 of that exam). Collection measures up to approximately 2.8 cm in size on prior CT, difficult to measure on this exam given location and morphology. Finding is indeterminate, but could reflect abscess. Remainder of the visualized soft tissues demonstrate no other acute finding. Normal flow voids seen within the vertebral arteries bilaterally. Disc levels: C1-2: Advanced osteoarthritic changes about the C1-2 articulation with degenerative  thickening about the dens and tectorial membrane. Superimposed ligamentum flavum hypertrophy. Resultant moderate spinal stenosis without frank cord impingement. C2-C3: Mild disc bulge with uncovertebral hypertrophy. Moderate left with mild right facet degeneration. Resultant moderate left with mild right C3 foraminal stenosis. C3-C4: Prior fusion with posterior decompression. No residual spinal stenosis. Residual mild to moderate left greater than right foraminal narrowing related to uncovertebral disease. C4-C5: Prior fusion with posterior decompression. No residual spinal stenosis. Residual mild to moderate bilateral foraminal narrowing related to residual uncovertebral and facet disease. C5-C6: Prior fusion with posterior decompression. No residual spinal stenosis. Residual moderate bilateral foraminal narrowing related to uncovertebral hypertrophy. C6-C7: Prior fusion with posterior decompression. Left paracentral endplate osseous ridging indents the left ventral thecal sac. No significant spinal stenosis. Residual moderate bilateral foraminal narrowing. C7-T1: Trace anterolisthesis. Disc bulge with uncovertebral hypertrophy with advanced facet degeneration. No  significant spinal stenosis. Moderate right worse than left C8 foraminal stenosis. Visualized upper thoracic spine demonstrates mild noncompressive disc bulging without significant stenosis. IMPRESSION: 1. Prominent soft tissue thickening with edema and enhancement involving the prevertebral soft tissues, extending from the clivus through the C2-3 level. Finding is nonspecific, but raises the possibility for acute skull base infection given the history of sepsis and bacteremia. Question tiny superimposed 5 mm collection as above. Correlation with symptomatology and laboratory values recommended. 2. Mild edema and enhancement involving the posterior suboccipital soft tissues with superimposed collection tracking along the posterior arch of C1, nonspecific,  but could also reflect acute infection. This collection is more easily seen on prior CT (series 5, image 18 of that examination). 3. No other evidence for acute infection within the cervical spine. 4. 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 5. Prior C4-5 corpectomy with C3-C6 anterior fusion, with C3-C7 posterior fusion and decompression. No residual spinal stenosis at these levels. Electronically Signed   By: Jeannine Boga M.D.   On: 10/27/2020 03:49   DG Chest Port 1 View  Result Date: 10/20/2020 CLINICAL DATA:  Cough. EXAM: PORTABLE CHEST 1 VIEW COMPARISON:  October 16, 2020. FINDINGS: The heart size and mediastinal contours are within normal limits. Both lungs are clear. The visualized skeletal structures are unremarkable. IMPRESSION: No active disease. Electronically Signed   By: Marijo Conception M.D.   On: 10/20/2020 09:56   DG Chest Portable 1 View  Result Date: 10/16/2020 CLINICAL DATA:  Progressive weakness EXAM: PORTABLE CHEST 1 VIEW COMPARISON:  12/16/2019 FINDINGS: Low lung volumes with central bronchovascular crowding. Mild cardiomegaly with probable slight central congestion. Aortic atherosclerosis. No pneumothorax. IMPRESSION: Low lung volumes.  Mild cardiomegaly with slight central congestion Electronically Signed   By: Donavan Foil M.D.   On: 10/16/2020 16:10   DG Abd 2 Views  Result Date: 10/23/2020 CLINICAL DATA:  Ileus. EXAM: ABDOMEN - 2 VIEW COMPARISON:  October 22, 2020. FINDINGS: No colonic dilatation is noted. Moderate amount of stool seen throughout the colon. Stable mildly dilated small bowel loops are noted which most consistent with ileus. There is no evidence of free air. No radio-opaque calculi or other significant radiographic abnormality is seen. IMPRESSION: Stable mildly dilated small bowel loops are noted most consistent with ileus. Moderate stool burden. Electronically  Signed   By: Marijo Conception M.D.   On: 10/23/2020 08:06   DG Abd 2 Views  Result Date: 10/22/2020 CLINICAL DATA:  Ileus, abdominal pain and distention, inpatient EXAM: ABDOMEN - 2 VIEW COMPARISON:  10/21/2020 abdominal radiograph FINDINGS: A few mildly dilated small bowel loops in the central abdomen, mildly improved. Moderate diffuse colonic stool and gas. No evidence of pneumatosis or pneumoperitoneum. No radiopaque nephrolithiasis. Marked lumbar spondylosis. Clear lung bases. Partially visualized bilateral total hip arthroplasty. IMPRESSION: A few mildly dilated small bowel loops in the central abdomen, mildly improved. Moderate diffuse colonic stool and gas, similar. Findings are compatible with improving mild adynamic ileus as reported. Electronically Signed   By: Ilona Sorrel M.D.   On: 10/22/2020 09:29   ECHOCARDIOGRAM COMPLETE  Result Date: 10/18/2020    ECHOCARDIOGRAM REPORT   Patient Name:   URIE LOUGHNER Date of Exam: 10/18/2020 Medical Rec #:  [id number]  Height:       67.0 in Accession #:    [id number] Weight:       227.0 lb Date of Birth:  [birth year]  BSA:  2.134 m Patient Age:    87 years   BP:           111/83 mmHg Patient Gender: M          HR:           81 bpm. Exam Location:  [facility] Procedure: 2D Echo, Cardiac Doppler and Color Doppler Indications:     Bacteremia R78.81  History:         Patient has no prior history of Echocardiogram examinations.                  COPD; Risk Factors:Diabetes.                   Mitral Valve: valve is present in the mitral position.  Sonographer:     Sherrie Sport RDCS (AE) Referring Phys:  [id number] Loletha Grayer Diagnosing Phys: Kate Sable MD  Sonographer Comments: Suboptimal apical window. IMPRESSIONS  1. Left ventricular ejection fraction, by estimation, is 55%. The left ventricle has low normal function. The left ventricle has no regional wall motion abnormalities. Left ventricular diastolic parameters are consistent with Grade II diastolic  dysfunction (pseudonormalization).  2. Right ventricular systolic function is normal. The right ventricular size is normal.  3. Left atrial size was severely dilated.  4. Right atrial size was mildly dilated.  5. 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 there is no evidence for vegetation.. The mitral valve has been repaired/replaced. Mild mitral valve regurgitation. The mean mitral valve gradient is 4.0 mmHg. There is a present in the mitral position.  Echo findings are consistent with normal structure and function of the mitral valve prosthesis.  6. The aortic valve is grossly normal. Aortic valve regurgitation is not visualized. Conclusion(s)/Recommendation(s): No evidence of valvular vegetations on this transthoracic echocardiogram. Would recommend a transesophageal echocardiogram to exclude infective endocarditis if clinically indicated. FINDINGS  Left Ventricle: Left ventricular ejection fraction, by estimation, is 55%. The left ventricle has low normal function. The left ventricle has no regional wall motion abnormalities. The left ventricular internal cavity size was normal in size. There is no left ventricular hypertrophy. Left ventricular diastolic parameters are consistent with Grade II diastolic dysfunction (pseudonormalization). Right Ventricle: The right ventricular size is normal. No increase in right ventricular wall thickness. Right ventricular systolic function is normal. Left Atrium: Left atrial size was severely dilated. Right Atrium: Right atrial size was mildly dilated. Pericardium: There is no evidence of pericardial effusion. 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 there is no evidence for vegetation. The mitral valve has been repaired/replaced. Mild mitral valve regurgitation. There is a present in the mitral position. Echo findings are consistent with normal structure and function of the mitral valve prosthesis. MV peak gradient, 10.1 mmHg. The mean mitral valve  gradient is 4.0 mmHg. Tricuspid Valve: The tricuspid valve is normal in structure. Tricuspid valve regurgitation is trivial. Aortic Valve: The aortic valve is grossly normal. Aortic valve regurgitation is not visualized. Aortic valve mean gradient measures 2.0 mmHg. Aortic valve peak gradient measures 4.1 mmHg. Aortic valve area, by VTI measures 2.76 cm. Pulmonic Valve: The pulmonic valve was not well visualized. Pulmonic valve regurgitation is not visualized. Aorta: The aortic root is normal in size and structure. IAS/Shunts: No atrial level shunt detected by color flow Doppler.  LEFT VENTRICLE PLAX 2D LVIDd:         4.42 cm  Diastology LVIDs:         2.33 cm  LV  e' medial:    4.79 cm/s LV PW:         1.01 cm  LV E/e' medial:  29.0 LV IVS:        0.89 cm  LV e' lateral:   7.62 cm/s LVOT diam:     2.10 cm  LV E/e' lateral: 18.2 LV SV:         48 LV SV Index:   23 LVOT Area:     3.46 cm  RIGHT VENTRICLE RV Basal diam:  3.80 cm RV S prime:     11.00 cm/s TAPSE (M-mode): 4.0 cm LEFT ATRIUM            Index       RIGHT ATRIUM           Index LA diam:      5.10 cm  2.39 cm/m  RA Area:     26.10 cm LA Vol (A2C): 117.0 ml 54.82 ml/m RA Volume:   79.30 ml  37.16 ml/m LA Vol (A4C): 105.0 ml 49.20 ml/m  AORTIC VALVE                   PULMONIC VALVE AV Area (Vmax):    2.41 cm    PV Vmax:        0.47 m/s AV Area (Vmean):   2.48 cm    PV Peak grad:   0.9 mmHg AV Area (VTI):     2.76 cm    RVOT Peak grad: 2 mmHg AV Vmax:           101.30 cm/s AV Vmean:          63.850 cm/s AV VTI:            0.176 m AV Peak Grad:      4.1 mmHg AV Mean Grad:      2.0 mmHg LVOT Vmax:         70.50 cm/s LVOT Vmean:        45.800 cm/s LVOT VTI:          0.140 m LVOT/AV VTI ratio: 0.80  AORTA Ao Root diam: 3.50 cm MITRAL VALVE                TRICUSPID VALVE MV Area (PHT): 2.88 cm     TR Peak grad:   29.8 mmHg MV Area VTI:   1.35 cm     TR Vmax:        273.00 cm/s MV Peak grad:  10.1 mmHg MV Mean grad:  4.0 mmHg     SHUNTS MV Vmax:        1.59 m/s     Systemic VTI:  0.14 m MV Vmean:      94.6 cm/s    Systemic Diam: 2.10 cm MV Decel Time: 263 msec MV E velocity: 139.00 cm/s MV A velocity: 118.00 cm/s MV E/A ratio:  1.18 Kate Sable MD Electronically signed by Kate Sable MD Signature Date/Time: 10/18/2020/2:01:47 PM    Final    ECHO TEE  Result Date: 10/19/2020    TRANSESOPHOGEAL ECHO REPORT   Patient Name:   Idolina Primer Date of Exam: 10/19/2020 Medical Rec #:  [id number]  Height:       67.0 in Accession #:    [id number] Weight:       227.0 lb Date of Birth:  Jun 22, 1945  BSA:          2.134 m Patient Age:    6 years  BP:           105/73 mmHg Patient Gender: M          HR:           73 bpm. Exam Location:  [facility] Procedure: Transesophageal Echo, Cardiac Doppler and Color Doppler Indications:     Bacteremia R78.81  History:         Patient has prior history of Echocardiogram examinations, most                  recent 10/18/2020. COPD; Risk Factors:Diabetes and Hypertension.                   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  Sonographer:     Sherrie Sport RDCS (AE) Referring Phys:  [id number] [place] Diagnosing Phys: Ida Rogue MD PROCEDURE: The transesophogeal probe was passed without difficulty through the esophogus of the patient. Imaged were obtained with the patient in a left lateral decubitus position. Local oropharyngeal anesthetic was provided with Benzocaine spray and Cetacaine. Sedation performed by different physician. The patient's vital signs; including heart rate, blood pressure, and oxygen saturation; remained stable throughout the procedure. The patient developed no complications during the procedure. IMPRESSIONS  1. No valve vegetation noted.  2. Left ventricular ejection fraction, by estimation, is 60 to 65%. The left ventricle has normal function. The left ventricle has no regional wall motion abnormalities.  3. Right ventricular systolic function is normal. The  right ventricular size is moderately enlarged.  4. Left atrial size was severely dilated. No left atrial/left atrial appendage thrombus was detected.  5. The mitral valve is normal in structure. Mild to moderate mitral valve regurgitation. No evidence of mitral stenosis. 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 Echo findings are consistent with normal structure and function of the mitral valve prosthesis.  6. Tricuspid valve regurgitation is moderate.  7. Evidence of atrial level shunting detected by color flow Doppler. There is a small to moderately sized atrial septal defect. Conclusion(s)/Recommendation(s): Normal biventricular function without evidence of hemodynamically significant valvular heart disease. FINDINGS  Left Ventricle: Left ventricular ejection fraction, by estimation, is 60 to 65%. The left ventricle has normal function. The left ventricle has no regional wall motion abnormalities. The left ventricular internal cavity size was normal in size. There is  no left ventricular hypertrophy. Right Ventricle: The right ventricular size is moderately enlarged. No increase in right ventricular wall thickness. Right ventricular systolic function is normal. Left Atrium: Left atrial size was severely dilated. No left atrial/left atrial appendage thrombus was detected. Right Atrium: Right atrial size was normal in size. Pericardium: There is no evidence of pericardial effusion. Mitral Valve: The mitral valve is normal in structure. Mild to moderate mitral valve regurgitation. 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 Echo findings are consistent with normal structure and function of the mitral valve prosthesis. No evidence of mitral valve stenosis. Tricuspid Valve: The tricuspid valve is normal in structure. Tricuspid valve regurgitation is moderate . No evidence of tricuspid stenosis. Aortic Valve: The aortic valve is normal in structure. Aortic valve regurgitation is not visualized.  Mild aortic valve sclerosis is present, with no evidence of aortic valve stenosis. Pulmonic Valve: The pulmonic valve was normal in structure. Pulmonic valve regurgitation is not visualized. No evidence of pulmonic stenosis. Aorta: The aortic root  is normal in size and structure. Venous: The inferior vena cava is normal in size with greater than 50% respiratory variability, suggesting right atrial pressure of 3 mmHg. IAS/Shunts: The interatrial septum is aneurysmal. Evidence of atrial level shunting detected by color flow Doppler. There is a moderately sized atrial septal defect. Ida Rogue MD Electronically signed by Ida Rogue MD Signature Date/Time: 10/19/2020/3:02:49 PM    Final (Updated)    [place] EKG SITE RITE  Result Date: 10/24/2020 If Site Rite image not attached, placement could not be confirmed due to current cardiac rhythm.     Labs: BNP (last 3 results) Recent Labs    12/27/19 1605  BNP 094.7*   Basic Metabolic Panel: Recent Labs  Lab 10/27/20 0604 10/29/20 0557 10/30/20 0449 10/31/20 0600 11/01/20 0545  NA 135 135 135 136 135  K 4.1 4.4 4.2 4.0 4.0  CL 99 100 100 99 96*  CO2 32 _0 GLUCOSE 128* 192* 166* 114* 120*  BUN 18 18 25* 26* 30*  CREATININE 0.71 0.82 0.86 0.87 0.97  CALCIUM 8.2* 8.7* 8.6* 8.8* 8.8*   Liver Function Tests: No results for input(s): AST, ALT, ALKPHOS, BILITOT, PROT, ALBUMIN in the last 168 hours. No results for input(s): LIPASE, AMYLASE in the last 168 hours. No results for input(s): AMMONIA in the last 168 hours. CBC: Recent Labs  Lab 10/27/20 0604 10/28/20 0500 10/30/20 0449 10/31/20 0600 11/01/20 0545  WBC 10.3 8.8 10.4 8.5 8.5  HGB 8.5* 8.3* 8.0* 8.1* 8.5*  HCT 25.6* 25.6* 24.9* 25.3* 26.9*  MCV 87.7 88.6 89.2 88.5 86.8  PLT 336 335 403* 388 388   Cardiac Enzymes: No results for input(s): CKTOTAL, CKMB, CKMBINDEX, TROPONINI in the last 168 hours. BNP: Invalid input(s): POCBNP CBG: Recent Labs  Lab  10/31/20 1138 10/31/20 1538 10/31/20 1947 11/01/20 0738 11/01/20 1147  GLUCAP 316* 299* 178* 113* 287*   D-Dimer No results for input(s): DDIMER in the last 72 hours. Hgb A1c No results for input(s): HGBA1C in the last 72 hours. Lipid Profile No results for input(s): CHOL, HDL, LDLCALC, TRIG, CHOLHDL, LDLDIRECT in the last 72 hours. Thyroid function studies No results for input(s): TSH, T4TOTAL, T3FREE, THYROIDAB in the last 72 hours.  Invalid input(s): FREET3 Anemia work up No results for input(s): VITAMINB12, FOLATE, FERRITIN, TIBC, IRON, RETICCTPCT in the last 72 hours. Urinalysis    Component Value Date/Time   COLORURINE YELLOW (A) 10/16/2020 1536   APPEARANCEUR HAZY (A) 10/16/2020 1536   LABSPEC 1.018 10/16/2020 1536   PHURINE 5.0 10/16/2020 1536   GLUCOSEU NEGATIVE 10/16/2020 1536   HGBUR MODERATE (A) 10/16/2020 1536   BILIRUBINUR NEGATIVE 10/16/2020 1536   KETONESUR NEGATIVE 10/16/2020 1536   PROTEINUR NEGATIVE 10/16/2020 1536   NITRITE NEGATIVE 10/16/2020 1536   LEUKOCYTESUR NEGATIVE 10/16/2020 1536   Sepsis Labs Invalid input(s): PROCALCITONIN,  WBC,  LACTICIDVEN Microbiology Recent Results (from the past 240 hour(s))  SARS CORONAVIRUS 2 (TAT 6-24 HRS) Nasopharyngeal Nasopharyngeal Swab     Status: None   Collection Time: 10/25/20 11:00 AM   Specimen: Nasopharyngeal Swab  Result Value Ref Range Status   SARS Coronavirus 2 NEGATIVE NEGATIVE Final    Comment: (NOTE) SARS-CoV-2 target nucleic acids are NOT DETECTED.  The SARS-CoV-2 RNA is generally detectable in upper and lower respiratory specimens during the acute phase of infection. Negative results do not preclude SARS-CoV-2 infection, do not rule out co-infections with other pathogens, and should not be used as the sole basis for treatment  or other patient management decisions. Negative results must be combined with clinical observations, patient history, and epidemiological information. The  expected result is Negative.  Fact Sheet for Patients: SugarRoll.be  Fact Sheet for Healthcare Providers: https://www.woods-mathews.com/  This test is not yet approved or cleared by the [place] FDA and  has been authorized for detection and/or diagnosis of SARS-CoV-2 by FDA under an Emergency Use Authorization (EUA). This EUA will remain  in effect (meaning this test can be used) for the duration of the COVID-19 declaration under Se ction 564(b)(1) of the Act, 21 U.S.C. section 360bbb-3(b)(1), unless the authorization is terminated or revoked sooner.  Performed at [facility], [place] [address] [place], [place] [address]   KOH prep     Status: None   Collection Time: 10/27/20 12:12 PM   Specimen: Esophagus  Result Value Ref Range Status   Specimen Description ESOPHAGUS  Final   Special Requests Normal  Final   KOH Prep   Final    YEAST WITH PSEUDOHYPHAE Performed at [facility], [address] [place], [place] [address]    Report Status 10/27/2020 FINAL  Final  MRSA Next Gen by PCR, Nasal     Status: Abnormal   Collection Time: 10/28/20  8:46 PM   Specimen: Nasal Mucosa; Nasal Swab  Result Value Ref Range Status   MRSA by PCR Next Gen DETECTED (A) NOT DETECTED Final    Comment: RESULT CALLED TO, READ BACK BY AND VERIFIED WITH: MARCELL TURNER _0  10/28/2020 LFD (NOTE) The GeneXpert MRSA Assay (FDA approved for NASAL specimens only), is one component of a comprehensive MRSA colonization surveillance program. It is not intended to diagnose MRSA infection nor to guide or monitor treatment for MRSA infections. Test performance is not FDA approved in patients less than 39 years old. Performed at [facility], [address] [place], [place] [address]   Resp Panel by RT-PCR (Flu A&B, Covid) Nasopharyngeal Swab     Status: None   Collection Time: 11/01/20 10:44 AM   Specimen: Nasopharyngeal Swab;  Nasopharyngeal(NP) swabs in vial transport medium  Result Value Ref Range Status   SARS Coronavirus 2 by RT PCR NEGATIVE NEGATIVE Final    Comment: (NOTE) SARS-CoV-2 target nucleic acids are NOT DETECTED.  The SARS-CoV-2 RNA is generally detectable in upper respiratory specimens during the acute phase of infection. The lowest concentration of SARS-CoV-2 viral copies this assay can detect is 138 copies/mL. A negative result does not preclude SARS-Cov-2 infection and should not be used as the sole basis for treatment or other patient management decisions. A negative result may occur with  improper specimen collection/handling, submission of specimen other than nasopharyngeal swab, presence of viral mutation(s) within the areas targeted by this assay, and inadequate number of viral copies(<138 copies/mL). A negative result must be combined with clinical observations, patient history, and epidemiological information. The expected result is Negative.  Fact Sheet for Patients:  EntrepreneurPulse.com.au  Fact Sheet for Healthcare Providers:  IncredibleEmployment.be  This test is no t yet approved or cleared by the [place] FDA and  has been authorized for detection and/or diagnosis of SARS-CoV-2 by FDA under an Emergency Use Authorization (EUA). This EUA will remain  in effect (meaning this test can be used) for the duration of the COVID-19 declaration under Section 564(b)(1) of the Act, 21 U.S.C.section 360bbb-3(b)(1), unless the authorization is terminated  or revoked sooner.       Influenza A by PCR NEGATIVE NEGATIVE Final  Influenza B by PCR NEGATIVE NEGATIVE Final    Comment: (NOTE) The Xpert Xpress SARS-CoV-2/FLU/RSV plus assay is intended as an aid in the diagnosis of influenza from Nasopharyngeal swab specimens and should not be used as a sole basis for treatment. Nasal washings and aspirates are unacceptable for Xpert Xpress  SARS-CoV-2/FLU/RSV testing.  Fact Sheet for Patients: EntrepreneurPulse.com.au  Fact Sheet for Healthcare Providers: IncredibleEmployment.be  This test is not yet approved or cleared by the [place] FDA and has been authorized for detection and/or diagnosis of SARS-CoV-2 by FDA under an Emergency Use Authorization (EUA). This EUA will remain in effect (meaning this test can be used) for the duration of the COVID-19 declaration under Section 564(b)(1) of the Act, 21 U.S.C. section 360bbb-3(b)(1), unless the authorization is terminated or revoked.  Performed at [facility], [address] [place], [place] [address]      Total time spend on discharging this patient, including the last patient exam, discussing the hospital stay, instructions for ongoing care as it relates to all pertinent caregivers, as well as preparing the medical discharge records, prescriptions, and/or referrals as applicable, is 45 minutes.    Enzo Bi, MD  [facility] 11/01/2020, 2:33 PM

## 2020-11-09 ENCOUNTER — Inpatient Hospital Stay: Payer: No Typology Code available for payment source | Admitting: Infectious Diseases

## 2020-11-23 ENCOUNTER — Ambulatory Visit: Payer: No Typology Code available for payment source | Attending: Infectious Diseases | Admitting: Infectious Diseases

## 2020-11-23 DIAGNOSIS — B955 Unspecified streptococcus as the cause of diseases classified elsewhere: Secondary | ICD-10-CM | POA: Diagnosis not present

## 2020-11-23 DIAGNOSIS — M4620 Osteomyelitis of vertebra, site unspecified: Secondary | ICD-10-CM

## 2020-11-23 DIAGNOSIS — R7881 Bacteremia: Secondary | ICD-10-CM | POA: Diagnosis not present

## 2020-11-23 NOTE — Progress Notes (Signed)
The purpose of this virtual visit is to provide medical care while limiting exposure to the novel coronavirus (COVID19) for both patient and office staff.   Consent was obtained for phone visit:  Yes.   Answered questions that patient had about telehealth interaction:  Yes.   I discussed the limitations, risks, security and privacy concerns of performing an evaluation and management service by telephone. I also discussed with the patient that there may be a patient responsible charge related to this service. The patient expressed understanding and agreed to proceed.   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 Finding was nonspecific, but raises the possibility for acute skull base infection given the history of sepsis and bacteremia. Question tiny superimposed 5 mm collection as above. Correlation with symptomatology and laboratory values recommended. 2. 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 On Iv ceftriaxone 2  grams Q 12 for 6 weeks- will need pO antibiotic after that Will get lab results from yesterday F/u in person in a week Total time spent 15 min

## 2020-11-30 ENCOUNTER — Other Ambulatory Visit: Payer: Self-pay

## 2020-11-30 ENCOUNTER — Ambulatory Visit: Payer: No Typology Code available for payment source | Attending: Infectious Diseases | Admitting: Infectious Diseases

## 2020-11-30 VITALS — BP 103/81 | HR 116 | Temp 97.9°F | Resp 16 | Ht 67.0 in | Wt 231.0 lb

## 2020-11-30 DIAGNOSIS — R7881 Bacteremia: Secondary | ICD-10-CM | POA: Insufficient documentation

## 2020-11-30 DIAGNOSIS — Z7901 Long term (current) use of anticoagulants: Secondary | ICD-10-CM | POA: Insufficient documentation

## 2020-11-30 DIAGNOSIS — Z7984 Long term (current) use of oral hypoglycemic drugs: Secondary | ICD-10-CM | POA: Insufficient documentation

## 2020-11-30 DIAGNOSIS — N319 Neuromuscular dysfunction of bladder, unspecified: Secondary | ICD-10-CM | POA: Insufficient documentation

## 2020-11-30 DIAGNOSIS — M4622 Osteomyelitis of vertebra, cervical region: Secondary | ICD-10-CM | POA: Insufficient documentation

## 2020-11-30 DIAGNOSIS — I480 Paroxysmal atrial fibrillation: Secondary | ICD-10-CM | POA: Insufficient documentation

## 2020-11-30 DIAGNOSIS — Z7951 Long term (current) use of inhaled steroids: Secondary | ICD-10-CM | POA: Insufficient documentation

## 2020-11-30 DIAGNOSIS — Z7952 Long term (current) use of systemic steroids: Secondary | ICD-10-CM | POA: Insufficient documentation

## 2020-11-30 DIAGNOSIS — I10 Essential (primary) hypertension: Secondary | ICD-10-CM | POA: Insufficient documentation

## 2020-11-30 DIAGNOSIS — E785 Hyperlipidemia, unspecified: Secondary | ICD-10-CM | POA: Insufficient documentation

## 2020-11-30 DIAGNOSIS — E119 Type 2 diabetes mellitus without complications: Secondary | ICD-10-CM | POA: Insufficient documentation

## 2020-11-30 DIAGNOSIS — Z79899 Other long term (current) drug therapy: Secondary | ICD-10-CM | POA: Insufficient documentation

## 2020-11-30 DIAGNOSIS — Z981 Arthrodesis status: Secondary | ICD-10-CM | POA: Insufficient documentation

## 2020-11-30 DIAGNOSIS — B954 Other streptococcus as the cause of diseases classified elsewhere: Secondary | ICD-10-CM | POA: Insufficient documentation

## 2020-11-30 DIAGNOSIS — J449 Chronic obstructive pulmonary disease, unspecified: Secondary | ICD-10-CM | POA: Insufficient documentation

## 2020-11-30 NOTE — Patient Instructions (Signed)
You are here to follow up on the cervical spine vertebral osteomyelitis and bacteremia- you are finishing Iv ceftriaxone on 12/03/20- the PICC line can be removed on 12/04/20- Ypu should start taking amoxicillin 1 gram  every 8 hours for 6 weeks. You need to get weekly labs while you take antibiotic- CBC/CMP/ESR on Mondays. And fax the results to [facility] [phone]. If you have any questions please call [phone]

## 2020-11-30 NOTE — Progress Notes (Signed)
NAME: Raymond Hays  DOB: 06-21-1945  MRN: [id number]  Date/Time: 11/30/2020 11:53 AM  Subjective:   Raymond Hays is a 75 y.o. male with h/o Afib, HTN, Mitral regurgitation for which he had a transcatheter mitral valve repair with implantation of mitral clips on 06/26/20, DM,  COPD, cervical spine fusion surgery,  poor mobility, Supra pubic catheter,Res[facility] of [facility] is here for follow up after recent hospitalization at [facility] between 10/16/20-11/01/20 for sepsis with Group C streptococcus bacteremia . He was started on ceftriaxone, hospital stay complicated by severe constipation, ileus which resolved with medical treatment.  He had TTE with no vegetation- He then had pain neck which was worse than his baseline with restricted mobility- he already had hardware in his cervical vertebrae.  Neurosurgery was consulted and MRI cervical spine was done which showed Prominent soft tissue thickening with edema and enhancement involving the prevertebral soft tissues, extending from the clivus through the C2-3 level. Finding was nonspecific, but raises the possibility for acute skull base infection given the history of sepsis and bacteremia. 2. Mild edema and enhancement involving the posterior suboccipital soft tissues with superimposed collection tracking along the posterior arch of C1, nonspecific, but could also reflect acute infection. This collection is more easily seen on prior CT (series 5, image 18 of that examination). 3. No other evidence for acute infection within the cervical spine. 4. 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 5. Prior C4-5 corpectomy with C3-C6 anterior fusion, with C3-C7 posterior fusion and decompression. No residual spinal stenosis at these levels.  The ceftriaxone dose was increased from grams Qd to Q12 to treat for skull based osteo- No surgical intervention was deemed necessary. He  was also c/o rt shoulder and rt knee pain which were assessed by ortho and thought not to be septic arthritis. [facility] 6 weeks of IV antibiotic on 12/03/20 He says he is feeling better than before, but still has pain and restricted neck movt He has long standing fecal incontinence He has neurogenic bladder and has SPC He has no fever  He has just started with PT as they had to get approval from [place]   Past Medical History:  Diagnosis Date   Allergic rhinitis    Anxiety    Arthritis    Chronic indwelling Foley catheter    COPD (chronic obstructive pulmonary disease) ([place])    Cough    Diabetes ([place])    Essential (primary) hypertension    Hemorrhoid    Hyperlipidemia    PAF (paroxysmal atrial fibrillation) ([facility])    Primary osteoarthritis, unspecified shoulder    Retention of urine, unspecified    Spinal stenosis    UTI (urinary tract infection)     Past Surgical History:  Procedure Laterality Date   APPENDECTOMY     BACK SURGERY     COLONOSCOPY N/A 10/27/2020   Procedure: COLONOSCOPY;  Surgeon: Jonathon Bellows, MD;  Location[facility] ENDOSCOPY;  Service: Gastroenterology;  Laterality: N/A;   ELBOW SURGERY     ESOPHAGOGASTRODUODENOSCOPY (EGD) WITH PROPOFOL N/A 10/27/2020   Procedure: ESOPHAGOGASTRODUODENOSCOPY (EGD) WITH PROPOFOL;  Surgeon: Jonathon Bellows, MD;  Location: [facility] ENDOSCOPY;  Service: Gastroenterology;  Laterality: N/A;   HERNIA REPAIR     KNEE SURGERY     neck     PARTIAL HIP ARTHROPLASTY Bilateral    TEE WITHOUT CARDIOVERSION N/A 10/19/2020   Procedure: TRANSESOPHAGEAL ECHOCARDIOGRAM (TEE);  Surgeon: Minna Merritts,  MD;  Location: [facility] ORS;  Service: Cardiovascular;  Laterality: N/A;   TONSILLECTOMY      Social History   Socioeconomic History   Marital status: Divorced    Spouse name: Not on file   Number of children: Not on file   Years of education: Not on file   Highest education level: Not on file  Occupational History    Occupation: retired    Comment: retired Nature conservation officer x 4 years; Education officer, museum; Energy manager as Archivist  Tobacco Use   Smoking status: Former    Packs/day: 0.50    Years: 10.00    Pack years: 5.00    Types: Cigarettes    Quit date: 2006    Years since quitting: 16.6   Smokeless tobacco: Never  Vaping Use   Vaping Use: Never used  Substance and Sexual Activity   Alcohol use: Not Currently   Drug use: Not Currently    Types: "Crack" cocaine    Comment: quit "crack" 15 years ago--11/04/2019   Sexual activity: Not on file  Other Topics Concern   Not on file  Social History Narrative   Not on file   Social Determinants of Health   Financial Resource Strain: Not on file  Food Insecurity: Not on file  Transportation Needs: Not on file  Physical Activity: Not on file  Stress: Not on file  Social Connections: Not on file  Intimate Partner Violence: Not on file    Family History  Problem Relation Age of Onset   Healthy Son    Healthy Son    No Known Allergies I? Current Outpatient Medications  Medication Sig Dispense Refill   acetaminophen (TYLENOL) 325 MG tablet Take 650 mg by mouth every 4 (four) hours as needed.     Albuterol Sulfate (PROAIR RESPICLICK) 509 (90 Base) MCG/ACT AEPB Inhale 2 puffs into the lungs every 4 (four) hours as needed (COPD).     ANTI-DANDRUFF 1 % SHAM Apply topically.     apixaban (ELIQUIS) 5 MG TABS tablet Take 5 mg by mouth 2 (two) times daily.      atorvastatin (LIPITOR) 20 MG tablet Take 20 mg by mouth daily.     BIOFREEZE 4 % GEL Apply topically.     bisacodyl (DULCOLAX) 10 MG suppository Place 10 mg rectally as needed for moderate constipation.     calcium carbonate (TUMS - DOSED IN MG ELEMENTAL CALCIUM) 500 MG chewable tablet Chew 2 tablets by mouth every 6 (six) hours as needed for indigestion or heartburn.     carbidopa-levodopa (SINEMET IR) 25-100 MG tablet Take 1 tablet by mouth 3 (three) times daily before meals. At 0630, 1130, 1630      carboxymethylcellulose (REFRESH PLUS) 0.5 % SOLN 1 drop as needed.     cefTRIAXone (ROCEPHIN) IVPB Inject 2 g into the vein every 12 (twelve) hours. Indication:  Group C streptococcus bacteremia with possible skull base infection Last Day of Therapy:  12/03/2020 Labs - Once weekly:  CBC/D, CMP, ESR and CRP Removed PICC upon completion of antibiotics Fax weekly labs to Dr Delaine Lame [phone] Method of administration may be changed at the discretion of home infusion pharmacist based upon assessment of the patient and/or caregiver's ability to self-administer the medication ordered. 64 Units 0   cetirizine (ZYRTEC) 10 MG tablet Take 10 mg by mouth daily.      clobetasol cream (TEMOVATE) 0.05 % Apply topically.     cyclobenzaprine (FLEXERIL) 5 MG tablet Take by mouth.  diclofenac Sodium (VOLTAREN) 1 % GEL Apply 4 g topically 4 (four) times daily.     diphenhydrAMINE-zinc acetate (BENADRYL ITCH STOPPING) cream Apply topically every 4 (four) hours.     Docusate Sodium (STOOL SOFTENER LAXATIVE PO) Take 1 tablet by mouth in the morning and at bedtime.     Emollient (CETAPHIL) cream Apply 1 application topically as needed.     Emollient (EUCERIN DAILY PROTECTION/SPF30) LOTN Apply topically as needed.     escitalopram (LEXAPRO) 5 MG tablet Take 5 mg by mouth daily.     feeding supplement, GLUCERNA SHAKE, (GLUCERNA SHAKE) LIQD Take 237 mLs by mouth 2 (two) times daily between meals.  0   ferrous sulfate 325 (65 FE) MG tablet Take 1 tablet (325 mg total) by mouth 3 (three) times daily with meals.  3   finasteride (PROSCAR) 5 MG tablet Take 5 mg by mouth daily.     fluticasone (FLONASE) 50 MCG/ACT nasal spray Place 1 spray into both nostrils daily.     gabapentin (NEURONTIN) 800 MG tablet Take 800 mg by mouth 2 (two) times daily.      heparin flush 10 UNIT/ML SOLN injection Inject into the vein.     hydrOXYzine (ATARAX/VISTARIL) 25 MG tablet Take 25 mg by mouth 2 (two) times daily as needed for  itching.     ipratropium (ATROVENT) 0.03 % nasal spray Place 2 sprays into both nostrils 2 (two) times daily.     ipratropium-albuterol (DUONEB) 0.5-2.5 (3) MG/3ML SOLN Inhale 3 mLs into the lungs daily. And every 6 hours as needed for shortness of breath or wheezing     lactase (LACTAID) 3000 units tablet Take 3,000 Units by mouth 3 (three) times daily with meals.     Lidocaine 3 % CREA Apply topically daily as needed.     metFORMIN (GLUCOPHAGE-XR) 500 MG 24 hr tablet Take 500 mg by mouth daily.      methocarbamol (ROBAXIN) 500 MG tablet Take 1,000 mg by mouth in the morning and at bedtime.      metoprolol succinate (TOPROL-XL) 50 MG 24 hr tablet Take 50 mg by mouth daily.      montelukast (SINGULAIR) 10 MG tablet Take 10 mg by mouth at bedtime.     nystatin (MYCOSTATIN/NYSTOP) powder      oxybutynin (DITROPAN-XL) 10 MG 24 hr tablet Take 1 tablet (10 mg total) by mouth daily.     pantoprazole (PROTONIX) 40 MG tablet Take 1 tablet (40 mg total) by mouth 2 (two) times daily before a meal. For 8 weeks.  2   polyethylene glycol (MIRALAX / GLYCOLAX) 17 g packet Take 17 g by mouth daily.     predniSONE (DELTASONE) 20 MG tablet Take 20 mg by mouth daily with breakfast.     psyllium (METAMUCIL) 58.6 % powder Take 1 packet by mouth daily.     SARNA lotion Apply topically.     senna-docusate (SENOKOT-S) 8.6-50 MG tablet Take by mouth.     simethicone (MYLICON) 628 MG chewable tablet Chew 125 mg by mouth 3 (three) times daily.     Skin Protectants, Misc. (MINERIN CREME EX) Apply topically. Apply to feet twice bid     tamsulosin (FLOMAX) 0.4 MG CAPS capsule Take 0.4 mg by mouth 2 (two) times daily.     torsemide (DEMADEX) 10 MG tablet Take 10 mg by mouth daily.     traMADol (ULTRAM) 50 MG tablet Take 50 mg by mouth every 4 (four) hours as needed.  traZODone (DESYREL) 100 MG tablet Take 100 mg by mouth at bedtime.     TRELEGY ELLIPTA 100-62.5-25 MCG/INH AEPB Inhale 1 puff into the lungs daily.      triamcinolone cream (KENALOG) 0.1 % Apply 1 application topically 2 (two) times daily.     No current facility-administered medications for this visit.     Abtx:  Anti-infectives (From admission, onward)    None       REVIEW OF SYSTEMS:  Const: negative fever, negative chills, negative weight loss Eyes: negative diplopia or visual changes, negative eye pain ENT: negative coryza, negative sore throat Resp:has  baseline dry cough Has oxygen but use sit only during PT Cards: negative for chest pain, palpitations, lower extremity edema GU: as above GI: constipation cleared Skin: negative for rash and pruritus Heme: negative for easy bruising and gum/nose bleeding MS: neck pain, restricted shoulder movt- unable to lift arms above head Wheel chair bound Neurolo:weakness arms/ legs Psych: negative for feelings of anxiety, depression  Endocrine: negative for thyroid, diabetes Allergy/Immunology- negative for any medication or food allergies ? Objective:  VITALS:  BP 103/81   Pulse (!) 116   Temp 97.9 F (36.6 C) (Oral)   Resp 16   Ht _0  (1.702 m)   Wt 231 lb (104.8 kg)   SpO2 94%   BMI 36.18 kg/m  PHYSICAL EXAM:  General: Alert, cooperative, no distress,cheerful- in wheel chair Head: Normocephalic, without obvious abnormality, atraumatic. Eyes: Conjunctivae clear, anicteric sclerae. Pupils are equal ENT not examined because of mask Neck: movt to the sides restricted. Lungs: b/l air entry Heart: irregular well controlled. Abdomen: Soft, SPC Extremities: Rt PICC  Skin: No rashes or lesions. Or bruising Lymph: Cervical, supraclavicular normal. Neurologic: weakness all extremities- could be from pain- unable to assess Pertinent Labs Lab Results 11/27/20 Cr 0.93, WBC 6.8, HB 8.9, PLT 215 CRP 0.57 ESR 17 ( was 65 on 10/19/20)   ? 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

## 2020-12-04 ENCOUNTER — Emergency Department
Admission: EM | Admit: 2020-12-04 | Discharge: 2020-12-04 | Disposition: A | Discharge status: Home / Self Care | Payer: No Typology Code available for payment source | Attending: Emergency Medicine | Admitting: Emergency Medicine

## 2020-12-04 ENCOUNTER — Other Ambulatory Visit: Payer: Self-pay

## 2020-12-04 ENCOUNTER — Encounter: Payer: Self-pay | Admitting: Emergency Medicine

## 2020-12-04 ENCOUNTER — Emergency Department: Payer: No Typology Code available for payment source

## 2020-12-04 DIAGNOSIS — Z79899 Other long term (current) drug therapy: Secondary | ICD-10-CM | POA: Diagnosis not present

## 2020-12-04 DIAGNOSIS — Z7901 Long term (current) use of anticoagulants: Secondary | ICD-10-CM | POA: Insufficient documentation

## 2020-12-04 DIAGNOSIS — E1140 Type 2 diabetes mellitus with diabetic neuropathy, unspecified: Secondary | ICD-10-CM | POA: Insufficient documentation

## 2020-12-04 DIAGNOSIS — Z8616 Personal history of COVID-19: Secondary | ICD-10-CM | POA: Insufficient documentation

## 2020-12-04 DIAGNOSIS — Z96643 Presence of artificial hip joint, bilateral: Secondary | ICD-10-CM | POA: Insufficient documentation

## 2020-12-04 DIAGNOSIS — Z87891 Personal history of nicotine dependence: Secondary | ICD-10-CM | POA: Diagnosis not present

## 2020-12-04 DIAGNOSIS — I480 Paroxysmal atrial fibrillation: Secondary | ICD-10-CM | POA: Insufficient documentation

## 2020-12-04 DIAGNOSIS — Z7984 Long term (current) use of oral hypoglycemic drugs: Secondary | ICD-10-CM | POA: Insufficient documentation

## 2020-12-04 DIAGNOSIS — Z7951 Long term (current) use of inhaled steroids: Secondary | ICD-10-CM | POA: Insufficient documentation

## 2020-12-04 DIAGNOSIS — M7989 Other specified soft tissue disorders: Secondary | ICD-10-CM | POA: Diagnosis not present

## 2020-12-04 DIAGNOSIS — I10 Essential (primary) hypertension: Secondary | ICD-10-CM | POA: Diagnosis not present

## 2020-12-04 DIAGNOSIS — J441 Chronic obstructive pulmonary disease with (acute) exacerbation: Secondary | ICD-10-CM | POA: Diagnosis not present

## 2020-12-04 LAB — CBC WITH DIFFERENTIAL/PLATELET
Abs Immature Granulocytes: 0.12 10*3/uL — ABNORMAL HIGH (ref 0.00–0.07)
Basophils Absolute: 0.1 10*3/uL (ref 0.0–0.1)
Basophils Relative: 1 %
Eosinophils Absolute: 0.1 10*3/uL (ref 0.0–0.5)
Eosinophils Relative: 2 %
HCT: 37.6 % — ABNORMAL LOW (ref 39.0–52.0)
Hemoglobin: 12.6 g/dL — ABNORMAL LOW (ref 13.0–17.0)
Immature Granulocytes: 2 %
Lymphocytes Relative: 18 %
Lymphs Abs: 1.4 10*3/uL (ref 0.7–4.0)
MCH: 30.1 pg (ref 26.0–34.0)
MCHC: 33.5 g/dL (ref 30.0–36.0)
MCV: 89.7 fL (ref 80.0–100.0)
Monocytes Absolute: 0.6 10*3/uL (ref 0.1–1.0)
Monocytes Relative: 8 %
Neutro Abs: 5.6 10*3/uL (ref 1.7–7.7)
Neutrophils Relative %: 69 %
Platelets: 159 10*3/uL (ref 150–400)
RBC: 4.19 MIL/uL — ABNORMAL LOW (ref 4.22–5.81)
RDW: 18.5 % — ABNORMAL HIGH (ref 11.5–15.5)
WBC: 7.9 10*3/uL (ref 4.0–10.5)
nRBC: 0 % (ref 0.0–0.2)

## 2020-12-04 LAB — COMPREHENSIVE METABOLIC PANEL
ALT: 10 U/L (ref 0–44)
AST: 14 U/L — ABNORMAL LOW (ref 15–41)
Albumin: 3.2 g/dL — ABNORMAL LOW (ref 3.5–5.0)
Alkaline Phosphatase: 76 U/L (ref 38–126)
Anion gap: 7 (ref 5–15)
BUN: 26 mg/dL — ABNORMAL HIGH (ref 8–23)
CO2: 29 mmol/L (ref 22–32)
Calcium: 8.9 mg/dL (ref 8.9–10.3)
Chloride: 104 mmol/L (ref 98–111)
Creatinine, Ser: 0.95 mg/dL (ref 0.61–1.24)
GFR, Estimated: >60 mL/min (ref >60)
Glucose, Bld: 129 mg/dL — ABNORMAL HIGH (ref 70–99)
Potassium: 3.4 mmol/L — ABNORMAL LOW (ref 3.5–5.1)
Sodium: 140 mmol/L (ref 135–145)
Total Bilirubin: 0.7 mg/dL (ref 0.3–1.2)
Total Protein: 6 g/dL — ABNORMAL LOW (ref 6.5–8.1)

## 2020-12-04 LAB — PROTIME-INR
INR: 1.3 — ABNORMAL HIGH (ref 0.8–1.2)
Prothrombin Time: 16 seconds — ABNORMAL HIGH (ref 11.4–15.2)

## 2020-12-04 LAB — APTT: aPTT: 31 seconds (ref 24–36)

## 2020-12-04 MED ORDER — POTASSIUM CHLORIDE 20 MEQ PO PACK
40.0000 meq | PACK | Freq: Two times a day (BID) | ORAL | Status: DC
  Administered 2020-12-04: 40 meq via ORAL
  Filled 2020-12-04: qty 2

## 2020-12-04 MED ORDER — FUROSEMIDE 10 MG/ML IJ SOLN
40.0000 mg | Freq: Once | INTRAMUSCULAR | Status: AC
  Administered 2020-12-04: 40 mg via INTRAVENOUS
  Filled 2020-12-04: qty 4

## 2020-12-04 NOTE — ED Notes (Signed)
This RN attempted to give report, unsuccessful at this time.

## 2020-12-04 NOTE — ED Notes (Signed)
Ultrasound at bedside

## 2020-12-04 NOTE — ED Provider Notes (Signed)
[facility]  ____________________________________________   Event Date/Time   First MD Initiated Contact with Patient 12/04/20 [phone]     (approximate)  I have reviewed the triage vital signs and the nursing notes.   HISTORY  Chief Complaint Leg Swelling    HPI Raymond Hays is a 75 y.o. male with past medical history of hypertension, hyperlipidemia, paroxysmal atrial fibrillation on Eliquis who presents with right lower extremity swelling.  Patient notes that this is been going on for several weeks.  He denies any associated prior injury or any pain in the leg.  He denies any new shortness of breath or chest pain.  Says that he often has dyspnea which is normal for him.  He is on diuretics heart failure.  He denies any abdominal pain, nausea vomiting or chest pain.  No fevers or chills.  Patient pointed out the leg swelling to the physician at his facility today who advised that he come to the ER.         Past Medical History:  Diagnosis Date   Allergic rhinitis    Anxiety    Arthritis    Chronic indwelling Foley catheter    COPD (chronic obstructive pulmonary disease) (HCC)    Cough    Diabetes (HCC)    Essential (primary) hypertension    Hemorrhoid    Hyperlipidemia    PAF (paroxysmal atrial fibrillation) (HCC)    Primary osteoarthritis, unspecified shoulder    Retention of urine, unspecified    Spinal stenosis    UTI (urinary tract infection)     Patient Active Problem List   Diagnosis Date Noted   Ileus (HCC)    Neck pain    AKI (acute kidney injury) (HCC)    Type 2 diabetes mellitus with diabetic neuropathy, without long-term current use of insulin (HCC)    Parkinson disease (HCC)    Sepsis (HCC) 10/16/2020   Chronic respiratory failure with hypoxia (HCC) 01/10/2020   Impaired functional mobility, balance, gait, and endurance 09/15/2019   Catheter cystitis (HCC) 10/16/2018   COVID-19 virus detected 07/03/2018   Benign prostatic  hyperplasia with lower urinary tract symptoms 08/06/2017   Osteoarthritis of right knee 06/19/2017   S/P orthopedic surgery, follow-up exam 10/25/2016   Tendon rupture of wrist, sequela 01/02/2016   Acute pain of right knee 08/03/2015   Calcific tendinitis of left shoulder 07/05/2015   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/05/2015   Gait difficulty 06/26/2015   Severe mitral regurgitation 06/05/2015   AF (paroxysmal atrial fibrillation) (HCC) 05/31/2015   Chronic pain 05/31/2015   COPD with acute exacerbation (HCC) 05/31/2015   Essential hypertension 05/31/2015   Chronic pain of left wrist 04/24/2015   Arthritis of left wrist 04/24/2015   Rupture of extensor tendon of left hand 04/24/2015    Past Surgical History:  Procedure Laterality Date   APPENDECTOMY     BACK SURGERY     COLONOSCOPY N/A 10/27/2020   Procedure: COLONOSCOPY;  Surgeon: Wyline Mood, MD;  Location: [facility] ENDOSCOPY;  Service: Gastroenterology;  Laterality: N/A;   ELBOW SURGERY     ESOPHAGOGASTRODUODENOSCOPY (EGD) WITH PROPOFOL N/A 10/27/2020   Procedure: ESOPHAGOGASTRODUODENOSCOPY (EGD) WITH PROPOFOL;  Surgeon: Wyline Mood, MD;  Location: [facility] ENDOSCOPY;  Service: Gastroenterology;  Laterality: N/A;   HERNIA REPAIR     KNEE SURGERY     neck     PARTIAL HIP ARTHROPLASTY Bilateral    TEE WITHOUT CARDIOVERSION N/A 10/19/2020   Procedure: TRANSESOPHAGEAL ECHOCARDIOGRAM (TEE);  Surgeon: Mariah Milling,  Tollie Pizza, MD;  Location: [facility] ORS;  Service: Cardiovascular;  Laterality: N/A;   TONSILLECTOMY      Prior to Admission medications   Medication Sig Start Date End Date Taking? Authorizing Provider  acetaminophen (TYLENOL) 325 MG tablet Take 650 mg by mouth every 4 (four) hours as needed.    [provider]  Albuterol Sulfate (PROAIR RESPICLICK) 108 (90 Base) MCG/ACT AEPB Inhale 2 puffs into the lungs every 4 (four) hours as needed (COPD).    [provider]  ANTI-DANDRUFF 1 % SHAM Apply topically. 02/18/20    [provider]  apixaban (ELIQUIS) 5 MG TABS tablet Take 5 mg by mouth 2 (two) times daily.  02/06/17   [provider]  atorvastatin (LIPITOR) 20 MG tablet Take 20 mg by mouth daily.    [provider]  BIOFREEZE 4 % GEL Apply topically. 11/17/20   [provider]  bisacodyl (DULCOLAX) 10 MG suppository Place 10 mg rectally as needed for moderate constipation.    [provider]  calcium carbonate (TUMS - DOSED IN MG ELEMENTAL CALCIUM) 500 MG chewable tablet Chew 2 tablets by mouth every 6 (six) hours as needed for indigestion or heartburn.    [provider]  carbidopa-levodopa (SINEMET IR) 25-100 MG tablet Take 1 tablet by mouth 3 (three) times daily before meals. At 0630, 1130, 1630    [provider]  carboxymethylcellulose (REFRESH PLUS) 0.5 % SOLN 1 drop as needed.    [provider]  cetirizine (ZYRTEC) 10 MG tablet Take 10 mg by mouth daily.     [provider]  clobetasol cream (TEMOVATE) 0.05 % Apply topically. 01/12/20   [provider]  cyclobenzaprine (FLEXERIL) 5 MG tablet Take by mouth. 11/06/20   [provider]  diclofenac Sodium (VOLTAREN) 1 % GEL Apply 4 g topically 4 (four) times daily.    [provider]  diphenhydrAMINE-zinc acetate (BENADRYL ITCH STOPPING) cream Apply topically every 4 (four) hours.    [provider]  Docusate Sodium (STOOL SOFTENER LAXATIVE PO) Take 1 tablet by mouth in the morning and at bedtime.    [provider]  Emollient (CETAPHIL) cream Apply 1 application topically as needed.    [provider]  Emollient (EUCERIN DAILY PROTECTION/SPF30) LOTN Apply topically as needed.    [provider]  escitalopram (LEXAPRO) 5 MG tablet Take 5 mg by mouth daily.    [provider]  feeding supplement, GLUCERNA SHAKE, (GLUCERNA SHAKE) LIQD Take 237 mLs by mouth 2 (two) times daily between meals. 11/01/20   [name],  MD  ferrous sulfate 325 (65 FE) MG tablet Take 1 tablet (325 mg total) by mouth 3 (three) times daily with meals. 11/01/20   [name], MD  finasteride (PROSCAR) 5 MG tablet Take 5 mg by mouth daily.    [provider]  fluticasone (FLONASE) 50 MCG/ACT nasal spray Place 1 spray into both nostrils daily.    [provider]  gabapentin (NEURONTIN) 800 MG tablet Take 800 mg by mouth 2 (two) times daily.     [provider]  heparin flush 10 UNIT/ML SOLN injection Inject into the vein. 11/14/20   [provider]  hydrOXYzine (ATARAX/VISTARIL) 25 MG tablet Take 25 mg by mouth 2 (two) times daily as needed for itching. 02/17/20   [provider]  ipratropium (ATROVENT) 0.03 % nasal spray Place 2 sprays into both nostrils 2 (two) times daily. 02/18/20   [provider]  ipratropium-albuterol (  DUONEB) 0.5-2.5 (3) MG/3ML SOLN Inhale 3 mLs into the lungs daily. And every 6 hours as needed for shortness of breath or wheezing    [provider]  lactase (LACTAID) 3000 units tablet Take 3,000 Units by mouth 3 (three) times daily with meals.    [provider]  Lidocaine 3 % CREA Apply topically daily as needed.    [provider]  metFORMIN (GLUCOPHAGE-XR) 500 MG 24 hr tablet Take 500 mg by mouth daily.     [provider]  methocarbamol (ROBAXIN) 500 MG tablet Take 1,000 mg by mouth in the morning and at bedtime.     [provider]  metoprolol succinate (TOPROL-XL) 50 MG 24 hr tablet Take 50 mg by mouth daily.     [provider]  montelukast (SINGULAIR) 10 MG tablet Take 10 mg by mouth at bedtime.    [provider]  nystatin (MYCOSTATIN/NYSTOP) powder  10/21/18   [provider]  oxybutynin (DITROPAN-XL) 10 MG 24 hr tablet Take 1 tablet (10 mg total) by mouth daily. 11/01/20   [name], MD  pantoprazole (PROTONIX) 40 MG tablet Take 1 tablet (40 mg total) by mouth 2 (two) times daily before  a meal. For 8 weeks. 11/01/20 01/03/21  [name], MD  polyethylene glycol (MIRALAX / GLYCOLAX) 17 g packet Take 17 g by mouth daily.    [provider]  predniSONE (DELTASONE) 20 MG tablet Take 20 mg by mouth daily with breakfast.    [provider]  psyllium (METAMUCIL) 58.6 % powder Take 1 packet by mouth daily.    [provider]  SARNA lotion Apply topically. 02/25/20   [provider]  senna-docusate (SENOKOT-S) 8.6-50 MG tablet Take by mouth. 10/21/18   [provider]  simethicone (MYLICON) 125 MG chewable tablet Chew 125 mg by mouth 3 (three) times daily.    [provider]  Skin Protectants, Misc. (MINERIN CREME EX) Apply topically. Apply to feet twice bid    [provider]  tamsulosin (FLOMAX) 0.4 MG CAPS capsule Take 0.4 mg by mouth 2 (two) times daily.    [provider]  torsemide (DEMADEX) 10 MG tablet Take 10 mg by mouth daily.    [provider]  traMADol (ULTRAM) 50 MG tablet Take 50 mg by mouth every 4 (four) hours as needed. 11/22/20   [provider]  traZODone (DESYREL) 100 MG tablet Take 100 mg by mouth at bedtime.    [provider]  TRELEGY ELLIPTA 100-62.5-25 MCG/INH AEPB Inhale 1 puff into the lungs daily. 02/17/20   [provider]  triamcinolone cream (KENALOG) 0.1 % Apply 1 application topically 2 (two) times daily.    [provider]    Allergies Patient has no known allergies.  Family History  Problem Relation Age of Onset   Healthy Son    Healthy Son     Social History Social History   Tobacco Use   Smoking status: Former    Packs/day: 0.50    Years: 10.00    Pack years: 5.00    Types: Cigarettes    Quit date: 2006    Years since quitting: 16.6   Smokeless tobacco: Never  Vaping Use   Vaping Use: Never used  Substance Use Topics   Alcohol use: Not Currently   Drug use: Not Currently    Types: "Crack" cocaine    Comment: quit  "crack" 15 years ago--11/04/2019    Review of Systems  Review of Systems  Constitutional:  Negative for chills and fever.  Respiratory:  Negative for shortness of breath.   Cardiovascular:  Positive for leg swelling. Negative for chest pain.  Gastrointestinal:  Negative for abdominal pain, nausea and vomiting.  All other systems reviewed and are negative.  Physical Exam Updated Vital Signs BP 117/73 (BP Location: Left Arm)   Pulse 98   Temp 97.8 F (36.6 C) (Oral)   Resp 20   Ht 5\' 7"  (1.702 m)   Wt 104.3 kg   SpO2 100%   BMI 36.02 kg/m   Physical Exam Vitals and nursing note reviewed.  Constitutional:      General: He is not in acute distress.    Appearance: Normal appearance.  HENT:     Head: Normocephalic and atraumatic.     Nose: Nose normal.     Mouth/Throat:     Mouth: Mucous membranes are moist.  Eyes:     General: No scleral icterus.    Conjunctiva/sclera: Conjunctivae normal.  Cardiovascular:     Rate and Rhythm: Normal rate and regular rhythm.  Pulmonary:     Effort: Pulmonary effort is normal. No respiratory distress.     Breath sounds: Normal breath sounds. No wheezing.  Abdominal:     General: There is no distension.     Palpations: Abdomen is soft.     Tenderness: There is no abdominal tenderness. There is no guarding.  Musculoskeletal:        General: No deformity or signs of injury.     Cervical back: Normal range of motion.     Comments: Right lower extremity with 2+ pitting edema from the foot to the thigh Normal range of motion of the knee dopplerable DP pulse, leg is warm and perfused  Left lower extremity with 1+ pitting edema, atrophied compared to right  Skin:    Coloration: Skin is not jaundiced or pale.  Neurological:     General: No focal deficit present.     Mental Status: He is alert and oriented to person, place, and time. Mental status is at baseline.  Psychiatric:        Mood and Affect: Mood normal.        Behavior: Behavior  normal.     LABS (all labs ordered are listed, but only abnormal results are displayed)  Labs Reviewed  COMPREHENSIVE METABOLIC PANEL - Abnormal; Notable for the following components:      Result Value   Potassium 3.4 (*)    Glucose, Bld 129 (*)    BUN 26 (*)    Total Protein 6.0 (*)    Albumin 3.2 (*)    AST 14 (*)    All other components within normal limits  CBC WITH DIFFERENTIAL/PLATELET - Abnormal; Notable for the following components:   RBC 4.19 (*)    Hemoglobin 12.6 (*)    HCT 37.6 (*)    RDW 18.5 (*)    Abs Immature Granulocytes 0.12 (*)    All other components within normal limits  PROTIME-INR - Abnormal; Notable for the following components:   Prothrombin Time 16.0 (*)    INR 1.3 (*)    All other components within normal limits  APTT   ____________________________________________  EKG  Atrial fibrillation, normal axis, normal rate, T wave flattening throughout ____________________________________________  RADIOLOGY Ky BarbanI, Jazman Reuter Rose Mcugh, personally viewed and evaluated these images (plain radiographs) as part of my medical decision making, as well as reviewing the written report by the radiologist.  ED MD interpretation: I reviewed the chest x-ray which shows some vascular congestion and poorly aerated lungs    ____________________________________________   PROCEDURES  Procedure(s) performed (including Critical Care):  Procedures   ____________________________________________   INITIAL IMPRESSION / ASSESSMENT AND PLAN / ED COURSE    Patient is a 75 year old male who presents with right lower extremity swelling from his facility to rule out DVT.  No associated traumatic injury and no infectious symptoms.  Patient is already anticoagulated on Eliquis.  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  He does have some pitting edema in the left leg as well.  Vital signs within normal limits he is satting 100% on room air and is without any significant dyspnea from his baseline.  DVT study was performed which is negative for DVT.  Chest x-ray shows some mild pulmonary vascular congestion but is poorly aerated.  His labs are at baseline, no AKI.  We will give a dose of IV Lasix.  Otherwise he is stable to be discharged back to the facility with compression stockings and elevation of the leg.  Clinical Course as of 12/04/20 1134  Mon Dec 04, 2020  1025 IMPRESSION: No evidence of significant deep venous thrombosis in either lower extremity.   [KM]  1109 IMPRESSION: No evidence of significant deep venous thrombosis in either lower extremity.     [KM]  1109 IMPRESSION: 1. Low volume chest with atelectasis and/or vascular congestion. 2. Right-sided central line with tip at the subclavian level.   [KM]    Clinical Course User Index [KM] Georga Hacking, MD     ____________________________________________   FINAL CLINICAL IMPRESSION(S) / ED DIAGNOSES  Final diagnoses:  Leg swelling     ED Discharge Orders     None        Note:  This document was prepared using Dragon voice recognition software and may include unintentional dictation errors.    Georga Hacking, MD 12/04/20 1134

## 2020-12-04 NOTE — ED Triage Notes (Addendum)
Pt via EMS from [facility]. Pt c/o swelling in the RLE that started couple weeks ago. Pt c/o intermittent R knee pain that started a couple weeks ago. Denies SOB and pain at this time. Pedal pulse present in RLE. Pt states he takes Eliquis Pt is A&Ox4 and NAD  Dr. Sidney Ace at bedside.

## 2020-12-04 NOTE — ED Notes (Signed)
Attempted to pull back on PICC line, unsuccessful

## 2020-12-04 NOTE — ED Notes (Signed)
[facility]  CALLED  FOR  TRANSPORT  TO  [facility] 

## 2020-12-04 NOTE — ED Notes (Signed)
Pt presents to the ED via EMS, from [facility]. Pt was sent over for an [place], pt has had some RLE edema for the past couple weeks. Pt states he does not have a lot of feelings in his legs but it does occasionally spasms. Pt's leg is warm to the touch and pedal pulse presents. Pt does take Eliquis.  Pt was recently in the hospital and is at home IV antibiotics. VSS. Pt is A&OX4 and NAD.

## 2020-12-04 NOTE — Discharge Instructions (Addendum)
An ultrasound of the right leg was performed which was negative for a DVT.  Labs were reassuring.  Patient was given a dose of IV Lasix to help with diuresis.  I recommend compression stockings and elevating the leg.

## 2020-12-28 ENCOUNTER — Ambulatory Visit: Payer: No Typology Code available for payment source | Admitting: Infectious Diseases

## 2021-01-09 ENCOUNTER — Inpatient Hospital Stay
Admission: EM | Admit: 2021-01-09 | Discharge: 2021-01-22 | DRG: 190 | Disposition: A | Discharge status: Skilled Nursing Facility | Payer: No Typology Code available for payment source | Attending: Internal Medicine | Admitting: Internal Medicine

## 2021-01-09 ENCOUNTER — Ambulatory Visit: Payer: No Typology Code available for payment source | Admitting: Infectious Diseases

## 2021-01-09 ENCOUNTER — Emergency Department: Payer: No Typology Code available for payment source

## 2021-01-09 ENCOUNTER — Other Ambulatory Visit: Payer: Self-pay

## 2021-01-09 ENCOUNTER — Encounter: Payer: Self-pay | Admitting: Emergency Medicine

## 2021-01-09 DIAGNOSIS — E872 Acidosis, unspecified: Secondary | ICD-10-CM | POA: Diagnosis present

## 2021-01-09 DIAGNOSIS — I248 Other forms of acute ischemic heart disease: Secondary | ICD-10-CM | POA: Diagnosis present

## 2021-01-09 DIAGNOSIS — Z7901 Long term (current) use of anticoagulants: Secondary | ICD-10-CM

## 2021-01-09 DIAGNOSIS — G20 Parkinson's disease: Secondary | ICD-10-CM | POA: Diagnosis present

## 2021-01-09 DIAGNOSIS — E1142 Type 2 diabetes mellitus with diabetic polyneuropathy: Secondary | ICD-10-CM | POA: Diagnosis present

## 2021-01-09 DIAGNOSIS — I447 Left bundle-branch block, unspecified: Secondary | ICD-10-CM | POA: Diagnosis present

## 2021-01-09 DIAGNOSIS — I5033 Acute on chronic diastolic (congestive) heart failure: Secondary | ICD-10-CM | POA: Diagnosis present

## 2021-01-09 DIAGNOSIS — I071 Rheumatic tricuspid insufficiency: Secondary | ICD-10-CM | POA: Diagnosis present

## 2021-01-09 DIAGNOSIS — Y738 Miscellaneous gastroenterology and urology devices associated with adverse incidents, not elsewhere classified: Secondary | ICD-10-CM | POA: Diagnosis not present

## 2021-01-09 DIAGNOSIS — N400 Enlarged prostate without lower urinary tract symptoms: Secondary | ICD-10-CM | POA: Diagnosis present

## 2021-01-09 DIAGNOSIS — K567 Ileus, unspecified: Secondary | ICD-10-CM | POA: Diagnosis present

## 2021-01-09 DIAGNOSIS — I700 Atherosclerosis of aorta: Secondary | ICD-10-CM | POA: Diagnosis present

## 2021-01-09 DIAGNOSIS — I110 Hypertensive heart disease with heart failure: Secondary | ICD-10-CM | POA: Diagnosis present

## 2021-01-09 DIAGNOSIS — I480 Paroxysmal atrial fibrillation: Secondary | ICD-10-CM | POA: Diagnosis present

## 2021-01-09 DIAGNOSIS — I4891 Unspecified atrial fibrillation: Secondary | ICD-10-CM

## 2021-01-09 DIAGNOSIS — E876 Hypokalemia: Secondary | ICD-10-CM | POA: Diagnosis present

## 2021-01-09 DIAGNOSIS — Q211 Atrial septal defect, unspecified: Secondary | ICD-10-CM | POA: Diagnosis not present

## 2021-01-09 DIAGNOSIS — J9690 Respiratory failure, unspecified, unspecified whether with hypoxia or hypercapnia: Secondary | ICD-10-CM | POA: Diagnosis present

## 2021-01-09 DIAGNOSIS — J441 Chronic obstructive pulmonary disease with (acute) exacerbation: Secondary | ICD-10-CM | POA: Diagnosis present

## 2021-01-09 DIAGNOSIS — Z20822 Contact with and (suspected) exposure to covid-19: Secondary | ICD-10-CM | POA: Diagnosis present

## 2021-01-09 DIAGNOSIS — Z6831 Body mass index (BMI) 31.0-31.9, adult: Secondary | ICD-10-CM

## 2021-01-09 DIAGNOSIS — I160 Hypertensive urgency: Secondary | ICD-10-CM | POA: Diagnosis present

## 2021-01-09 DIAGNOSIS — R109 Unspecified abdominal pain: Secondary | ICD-10-CM

## 2021-01-09 DIAGNOSIS — F419 Anxiety disorder, unspecified: Secondary | ICD-10-CM | POA: Diagnosis present

## 2021-01-09 DIAGNOSIS — N200 Calculus of kidney: Secondary | ICD-10-CM | POA: Diagnosis present

## 2021-01-09 DIAGNOSIS — I5031 Acute diastolic (congestive) heart failure: Secondary | ICD-10-CM | POA: Diagnosis not present

## 2021-01-09 DIAGNOSIS — D649 Anemia, unspecified: Secondary | ICD-10-CM | POA: Diagnosis present

## 2021-01-09 DIAGNOSIS — N179 Acute kidney failure, unspecified: Secondary | ICD-10-CM | POA: Diagnosis present

## 2021-01-09 DIAGNOSIS — Z96643 Presence of artificial hip joint, bilateral: Secondary | ICD-10-CM | POA: Diagnosis present

## 2021-01-09 DIAGNOSIS — J9600 Acute respiratory failure, unspecified whether with hypoxia or hypercapnia: Secondary | ICD-10-CM | POA: Diagnosis present

## 2021-01-09 DIAGNOSIS — Z7984 Long term (current) use of oral hypoglycemic drugs: Secondary | ICD-10-CM

## 2021-01-09 DIAGNOSIS — E785 Hyperlipidemia, unspecified: Secondary | ICD-10-CM | POA: Diagnosis present

## 2021-01-09 DIAGNOSIS — R059 Cough, unspecified: Secondary | ICD-10-CM

## 2021-01-09 DIAGNOSIS — Z79899 Other long term (current) drug therapy: Secondary | ICD-10-CM

## 2021-01-09 DIAGNOSIS — R609 Edema, unspecified: Secondary | ICD-10-CM

## 2021-01-09 DIAGNOSIS — E669 Obesity, unspecified: Secondary | ICD-10-CM | POA: Diagnosis present

## 2021-01-09 DIAGNOSIS — K219 Gastro-esophageal reflux disease without esophagitis: Secondary | ICD-10-CM | POA: Diagnosis present

## 2021-01-09 DIAGNOSIS — Z23 Encounter for immunization: Secondary | ICD-10-CM | POA: Diagnosis not present

## 2021-01-09 DIAGNOSIS — R21 Rash and other nonspecific skin eruption: Secondary | ICD-10-CM | POA: Diagnosis present

## 2021-01-09 DIAGNOSIS — J9601 Acute respiratory failure with hypoxia: Secondary | ICD-10-CM | POA: Diagnosis present

## 2021-01-09 DIAGNOSIS — T83030A Leakage of cystostomy catheter, initial encounter: Secondary | ICD-10-CM | POA: Diagnosis not present

## 2021-01-09 DIAGNOSIS — Z87891 Personal history of nicotine dependence: Secondary | ICD-10-CM

## 2021-01-09 DIAGNOSIS — Z87440 Personal history of urinary (tract) infections: Secondary | ICD-10-CM

## 2021-01-09 LAB — D-DIMER, QUANTITATIVE: D-Dimer, Quant: 3.65 ug/mL-FEU — ABNORMAL HIGH (ref 0.00–0.50)

## 2021-01-09 LAB — COMPREHENSIVE METABOLIC PANEL
ALT: 8 U/L (ref 0–44)
AST: 12 U/L — ABNORMAL LOW (ref 15–41)
Albumin: 3.2 g/dL — ABNORMAL LOW (ref 3.5–5.0)
Alkaline Phosphatase: 67 U/L (ref 38–126)
Anion gap: 10 (ref 5–15)
BUN: 28 mg/dL — ABNORMAL HIGH (ref 8–23)
CO2: 30 mmol/L (ref 22–32)
Calcium: 8.8 mg/dL — ABNORMAL LOW (ref 8.9–10.3)
Chloride: 97 mmol/L — ABNORMAL LOW (ref 98–111)
Creatinine, Ser: 1.35 mg/dL — ABNORMAL HIGH (ref 0.61–1.24)
GFR, Estimated: 54 mL/min — ABNORMAL LOW (ref >60)
Glucose, Bld: 157 mg/dL — ABNORMAL HIGH (ref 70–99)
Potassium: 3.9 mmol/L (ref 3.5–5.1)
Sodium: 137 mmol/L (ref 135–145)
Total Bilirubin: 1 mg/dL (ref 0.3–1.2)
Total Protein: 6.6 g/dL (ref 6.5–8.1)

## 2021-01-09 LAB — CBC WITH DIFFERENTIAL/PLATELET
Abs Immature Granulocytes: 0.08 10*3/uL — ABNORMAL HIGH (ref 0.00–0.07)
Basophils Absolute: 0.1 10*3/uL (ref 0.0–0.1)
Basophils Relative: 1 %
Eosinophils Absolute: 0.1 10*3/uL (ref 0.0–0.5)
Eosinophils Relative: 1 %
HCT: 36.6 % — ABNORMAL LOW (ref 39.0–52.0)
Hemoglobin: 12.4 g/dL — ABNORMAL LOW (ref 13.0–17.0)
Immature Granulocytes: 1 %
Lymphocytes Relative: 16 %
Lymphs Abs: 1.3 10*3/uL (ref 0.7–4.0)
MCH: 31.5 pg (ref 26.0–34.0)
MCHC: 33.9 g/dL (ref 30.0–36.0)
MCV: 92.9 fL (ref 80.0–100.0)
Monocytes Absolute: 0.7 10*3/uL (ref 0.1–1.0)
Monocytes Relative: 9 %
Neutro Abs: 6 10*3/uL (ref 1.7–7.7)
Neutrophils Relative %: 72 %
Platelets: 235 10*3/uL (ref 150–400)
RBC: 3.94 MIL/uL — ABNORMAL LOW (ref 4.22–5.81)
RDW: 19.7 % — ABNORMAL HIGH (ref 11.5–15.5)
WBC: 8.2 10*3/uL (ref 4.0–10.5)
nRBC: 0.4 % — ABNORMAL HIGH (ref 0.0–0.2)

## 2021-01-09 LAB — LACTIC ACID, PLASMA
Lactic Acid, Venous: 2.7 mmol/L (ref 0.5–1.9)
Lactic Acid, Venous: 2.4 mmol/L (ref 0.5–1.9)

## 2021-01-09 LAB — PROCALCITONIN: Procalcitonin: 0.22 ng/mL

## 2021-01-09 LAB — TROPONIN I (HIGH SENSITIVITY)
Troponin I (High Sensitivity): 22 ng/L — ABNORMAL HIGH (ref <18)
Troponin I (High Sensitivity): 18 ng/L — ABNORMAL HIGH (ref <18)

## 2021-01-09 LAB — BRAIN NATRIURETIC PEPTIDE: B Natriuretic Peptide: 263.7 pg/mL — ABNORMAL HIGH (ref 0.0–100.0)

## 2021-01-09 LAB — MAGNESIUM: Magnesium: 1.9 mg/dL (ref 1.7–2.4)

## 2021-01-09 MED ORDER — GABAPENTIN 800 MG PO TABS
800.0000 mg | ORAL_TABLET | Freq: Two times a day (BID) | ORAL | Status: DC
  Filled 2021-01-09: qty 1

## 2021-01-09 MED ORDER — MAGNESIUM HYDROXIDE 400 MG/5ML PO SUSP: 30.0000 mL | Freq: Every day | ORAL | Status: DC | PRN

## 2021-01-09 MED ORDER — SENNOSIDES-DOCUSATE SODIUM 8.6-50 MG PO TABS
1.0000 | ORAL_TABLET | Freq: Every day | ORAL | Status: DC
  Administered 2021-01-15 – 2021-01-21 (×7): 1 via ORAL
  Filled 2021-01-09 (×10): qty 1

## 2021-01-09 MED ORDER — IPRATROPIUM-ALBUTEROL 0.5-2.5 (3) MG/3ML IN SOLN
3.0000 mL | Freq: Once | RESPIRATORY_TRACT | Status: AC
  Administered 2021-01-09: 3 mL via RESPIRATORY_TRACT
  Filled 2021-01-09: qty 3

## 2021-01-09 MED ORDER — METHYLPREDNISOLONE SODIUM SUCC 125 MG IJ SOLR
80.0000 mg | INTRAMUSCULAR | Status: AC
  Administered 2021-01-10: 80 mg via INTRAVENOUS
  Filled 2021-01-09: qty 2

## 2021-01-09 MED ORDER — ONDANSETRON HCL 4 MG PO TABS: 4.0000 mg | ORAL_TABLET | Freq: Four times a day (QID) | ORAL | Status: DC | PRN

## 2021-01-09 MED ORDER — GUAIFENESIN ER 600 MG PO TB12
600.0000 mg | ORAL_TABLET | Freq: Two times a day (BID) | ORAL | Status: DC
  Administered 2021-01-11 – 2021-01-22 (×23): 600 mg via ORAL
  Filled 2021-01-09 (×24): qty 1

## 2021-01-09 MED ORDER — MUSCLE RUB 10-15 % EX CREA
1.0000 "application " | TOPICAL_CREAM | Freq: Three times a day (TID) | CUTANEOUS | Status: DC
  Administered 2021-01-14: 1 via TOPICAL
  Filled 2021-01-09 (×3): qty 85

## 2021-01-09 MED ORDER — SODIUM CHLORIDE 0.9 % IV BOLUS
500.0000 mL | Freq: Once | INTRAVENOUS | Status: AC
  Administered 2021-01-09: 500 mL via INTRAVENOUS

## 2021-01-09 MED ORDER — TORSEMIDE 20 MG PO TABS
10.0000 mg | ORAL_TABLET | Freq: Every day | ORAL | Status: DC
  Filled 2021-01-09: qty 1

## 2021-01-09 MED ORDER — ONDANSETRON HCL 4 MG/2ML IJ SOLN
4.0000 mg | Freq: Four times a day (QID) | INTRAMUSCULAR | Status: DC | PRN
  Administered 2021-01-10 – 2021-01-14 (×5): 4 mg via INTRAVENOUS
  Filled 2021-01-09 (×5): qty 2

## 2021-01-09 MED ORDER — APIXABAN 5 MG PO TABS
5.0000 mg | ORAL_TABLET | Freq: Two times a day (BID) | ORAL | Status: DC
  Administered 2021-01-11 – 2021-01-22 (×23): 5 mg via ORAL
  Filled 2021-01-09 (×24): qty 1

## 2021-01-09 MED ORDER — ACETAMINOPHEN 325 MG PO TABS: 650.0000 mg | ORAL_TABLET | Freq: Four times a day (QID) | ORAL | Status: DC | PRN

## 2021-01-09 MED ORDER — ACETAMINOPHEN 650 MG RE SUPP
650.0000 mg | Freq: Four times a day (QID) | RECTAL | Status: DC | PRN
  Filled 2021-01-09: qty 1

## 2021-01-09 MED ORDER — SODIUM CHLORIDE 0.9 % IV SOLN
2.0000 g | Freq: Once | INTRAVENOUS | Status: AC
  Administered 2021-01-09: 2 g via INTRAVENOUS
  Filled 2021-01-09: qty 20

## 2021-01-09 MED ORDER — PREDNISONE 20 MG PO TABS: 20.0000 mg | ORAL_TABLET | Freq: Every day | ORAL | Status: DC

## 2021-01-09 MED ORDER — FERROUS SULFATE 325 (65 FE) MG PO TABS
325.0000 mg | ORAL_TABLET | Freq: Three times a day (TID) | ORAL | Status: DC
  Administered 2021-01-10 – 2021-01-22 (×34): 325 mg via ORAL
  Filled 2021-01-09 (×34): qty 1

## 2021-01-09 MED ORDER — ESCITALOPRAM OXALATE 10 MG PO TABS
5.0000 mg | ORAL_TABLET | Freq: Every day | ORAL | Status: DC
  Administered 2021-01-11 – 2021-01-22 (×12): 5 mg via ORAL
  Filled 2021-01-09: qty 1
  Filled 2021-01-09 (×5): qty 0.5
  Filled 2021-01-09: qty 1
  Filled 2021-01-09 (×7): qty 0.5

## 2021-01-09 MED ORDER — CEFTRIAXONE SODIUM 1 G IJ SOLR: 1.0000 g | INTRAMUSCULAR | Status: DC

## 2021-01-09 MED ORDER — IPRATROPIUM BROMIDE 0.03 % NA SOLN
2.0000 | Freq: Two times a day (BID) | NASAL | Status: DC
  Administered 2021-01-11 – 2021-01-18 (×13): 2 via NASAL
  Filled 2021-01-09 (×3): qty 30

## 2021-01-09 MED ORDER — BISACODYL 10 MG RE SUPP
10.0000 mg | RECTAL | Status: DC | PRN
  Filled 2021-01-09: qty 1

## 2021-01-09 MED ORDER — ATORVASTATIN CALCIUM 20 MG PO TABS
20.0000 mg | ORAL_TABLET | Freq: Every day | ORAL | Status: DC
  Administered 2021-01-11 – 2021-01-22 (×12): 20 mg via ORAL
  Filled 2021-01-09 (×13): qty 1

## 2021-01-09 MED ORDER — METOPROLOL SUCCINATE ER 50 MG PO TB24
50.0000 mg | ORAL_TABLET | Freq: Every day | ORAL | Status: DC
  Administered 2021-01-11: 50 mg via ORAL
  Filled 2021-01-09 (×2): qty 1

## 2021-01-09 MED ORDER — PREDNISONE 20 MG PO TABS
40.0000 mg | ORAL_TABLET | Freq: Every day | ORAL | Status: AC
Start: 2021-01-11 — End: 2021-01-15
  Administered 2021-01-12 – 2021-01-14 (×3): 40 mg via ORAL
  Filled 2021-01-09 (×4): qty 2

## 2021-01-09 MED ORDER — PANTOPRAZOLE SODIUM 40 MG PO TBEC
40.0000 mg | DELAYED_RELEASE_TABLET | Freq: Two times a day (BID) | ORAL | Status: DC
  Administered 2021-01-10 – 2021-01-22 (×24): 40 mg via ORAL
  Filled 2021-01-09 (×24): qty 1

## 2021-01-09 MED ORDER — METHYLPREDNISOLONE SODIUM SUCC 125 MG IJ SOLR
125.0000 mg | Freq: Once | INTRAMUSCULAR | Status: AC
  Administered 2021-01-09: 125 mg via INTRAVENOUS
  Filled 2021-01-09: qty 2

## 2021-01-09 MED ORDER — GABAPENTIN 400 MG PO CAPS
800.0000 mg | ORAL_CAPSULE | Freq: Two times a day (BID) | ORAL | Status: DC
  Administered 2021-01-11 – 2021-01-22 (×23): 800 mg via ORAL
  Filled 2021-01-09 (×24): qty 2

## 2021-01-09 MED ORDER — METHOCARBAMOL 500 MG PO TABS
1000.0000 mg | ORAL_TABLET | Freq: Two times a day (BID) | ORAL | Status: DC
  Administered 2021-01-11 – 2021-01-22 (×23): 1000 mg via ORAL
  Filled 2021-01-09 (×27): qty 2

## 2021-01-09 MED ORDER — HYDROXYZINE HCL 25 MG PO TABS: 25.0000 mg | ORAL_TABLET | Freq: Two times a day (BID) | ORAL | Status: DC | PRN

## 2021-01-09 MED ORDER — OXYBUTYNIN CHLORIDE ER 10 MG PO TB24
10.0000 mg | ORAL_TABLET | Freq: Every day | ORAL | Status: DC
  Administered 2021-01-11 – 2021-01-22 (×11): 10 mg via ORAL
  Filled 2021-01-09 (×13): qty 1

## 2021-01-09 MED ORDER — CALCIUM CARBONATE ANTACID 500 MG PO CHEW: 2.0000 | CHEWABLE_TABLET | Freq: Four times a day (QID) | ORAL | Status: DC | PRN

## 2021-01-09 MED ORDER — FINASTERIDE 5 MG PO TABS
5.0000 mg | ORAL_TABLET | Freq: Every day | ORAL | Status: DC
  Administered 2021-01-11 – 2021-01-22 (×12): 5 mg via ORAL
  Filled 2021-01-09 (×13): qty 1

## 2021-01-09 MED ORDER — CARBIDOPA-LEVODOPA 25-100 MG PO TABS
1.0000 | ORAL_TABLET | Freq: Three times a day (TID) | ORAL | Status: DC
  Administered 2021-01-10 – 2021-01-22 (×34): 1 via ORAL
  Filled 2021-01-09 (×39): qty 1

## 2021-01-09 MED ORDER — FLUTICASONE PROPIONATE 50 MCG/ACT NA SUSP
1.0000 | Freq: Every day | NASAL | Status: DC
  Administered 2021-01-11 – 2021-01-18 (×8): 1 via NASAL
  Filled 2021-01-09 (×2): qty 16

## 2021-01-09 MED ORDER — DILTIAZEM HCL 25 MG/5ML IV SOLN
10.0000 mg | Freq: Once | INTRAVENOUS | Status: AC
  Administered 2021-01-09: 10 mg via INTRAVENOUS
  Filled 2021-01-09: qty 5

## 2021-01-09 MED ORDER — SODIUM CHLORIDE 0.9 % IV SOLN
500.0000 mg | Freq: Once | INTRAVENOUS | Status: AC
  Administered 2021-01-09: 500 mg via INTRAVENOUS
  Filled 2021-01-09: qty 500

## 2021-01-09 MED ORDER — POLYETHYLENE GLYCOL 3350 17 G PO PACK
17.0000 g | PACK | Freq: Every day | ORAL | Status: DC
  Administered 2021-01-11 – 2021-01-22 (×9): 17 g via ORAL
  Filled 2021-01-09 (×12): qty 1

## 2021-01-09 MED ORDER — SIMETHICONE 80 MG PO CHEW
80.0000 mg | CHEWABLE_TABLET | Freq: Four times a day (QID) | ORAL | Status: DC | PRN
  Administered 2021-01-14: 80 mg via ORAL
  Filled 2021-01-09 (×3): qty 1

## 2021-01-09 MED ORDER — FLUTICASONE-UMECLIDIN-VILANT 100-62.5-25 MCG/INH IN AEPB: 1.0000 | INHALATION_SPRAY | Freq: Every day | RESPIRATORY_TRACT | Status: DC

## 2021-01-09 MED ORDER — LACTASE 3000 UNITS PO TABS
3000.0000 [IU] | ORAL_TABLET | Freq: Three times a day (TID) | ORAL | Status: DC
  Administered 2021-01-10 – 2021-01-22 (×31): 3000 [IU] via ORAL
  Filled 2021-01-09 (×40): qty 1

## 2021-01-09 MED ORDER — FUROSEMIDE 10 MG/ML IJ SOLN
20.0000 mg | Freq: Two times a day (BID) | INTRAMUSCULAR | Status: DC
Start: 2021-01-09 — End: 2021-01-11
  Administered 2021-01-09 – 2021-01-10 (×3): 20 mg via INTRAVENOUS
  Filled 2021-01-09 (×3): qty 4

## 2021-01-09 MED ORDER — TRAZODONE HCL 100 MG PO TABS
100.0000 mg | ORAL_TABLET | Freq: Every day | ORAL | Status: DC
  Administered 2021-01-11 – 2021-01-21 (×11): 100 mg via ORAL
  Filled 2021-01-09 (×12): qty 1

## 2021-01-09 MED ORDER — IPRATROPIUM-ALBUTEROL 0.5-2.5 (3) MG/3ML IN SOLN
3.0000 mL | RESPIRATORY_TRACT | Status: DC | PRN
  Administered 2021-01-10 – 2021-01-12 (×3): 3 mL via RESPIRATORY_TRACT
  Filled 2021-01-09 (×3): qty 3

## 2021-01-09 MED ORDER — TRAMADOL HCL 50 MG PO TABS: 100.0000 mg | ORAL_TABLET | Freq: Three times a day (TID) | ORAL | Status: DC | PRN

## 2021-01-09 MED ORDER — GLUCERNA SHAKE PO LIQD
237.0000 mL | Freq: Two times a day (BID) | ORAL | Status: DC
  Administered 2021-01-11 – 2021-01-21 (×12): 237 mL via ORAL

## 2021-01-09 MED ORDER — HYDROCOD POLST-CPM POLST ER 10-8 MG/5ML PO SUER
5.0000 mL | Freq: Two times a day (BID) | ORAL | Status: DC | PRN
  Administered 2021-01-11 – 2021-01-21 (×7): 5 mL via ORAL
  Filled 2021-01-09 (×9): qty 5

## 2021-01-09 MED ORDER — ENOXAPARIN SODIUM 40 MG/0.4ML IJ SOSY: 40.0000 mg | PREFILLED_SYRINGE | INTRAMUSCULAR | Status: DC

## 2021-01-09 MED ORDER — TRAZODONE HCL 50 MG PO TABS: 25.0000 mg | ORAL_TABLET | Freq: Every evening | ORAL | Status: DC | PRN

## 2021-01-09 MED ORDER — IOHEXOL 350 MG/ML SOLN
75.0000 mL | Freq: Once | INTRAVENOUS | Status: AC | PRN
  Administered 2021-01-09: 75 mL via INTRAVENOUS

## 2021-01-09 MED ORDER — PSYLLIUM 95 % PO PACK
1.0000 | PACK | Freq: Every day | ORAL | Status: DC
  Administered 2021-01-11 – 2021-01-22 (×7): 1 via ORAL
  Filled 2021-01-09 (×13): qty 1

## 2021-01-09 NOTE — ED Provider Notes (Signed)
Of COPD and possible CHF.  Chest x-ray is clear.  Patient has elevated troponin, BNP, and does appear hypervolemic concerning for possible CHF although I reviewed his echo from fairly recently which shows normal ejection fraction.  Suspect possible diastolic component.  Otherwise, EKG is nonischemic with no signs of  [facility] Emergency Department Provider Note  ____________________________________________   Event Date/Time   First MD Initiated Contact with Patient 01/09/21 1544     (approximate)  I have reviewed the triage vital signs and the nursing notes.   HISTORY  Chief Complaint Respiratory Distress    HPI Raymond Hays is a 75 y.o. male with past medical history of COPD, paroxysmal A. fib, diastolic heart failure, here with shortness of breath.  The patient states that for the last several days, he said progressive worsening shortness of breath and wheezing.  He has felt like he has had chest congestion but has been unable to produce any sputum.  He has had associated dyspnea, now present at rest.  On EMS arrival to his nursing facility, he was satting in the mid to upper 80s on room air.  He is in obvious respiratory distress.  Placed on BiPAP which did seem to decrease his blood pressure.  He has been in RVR.  He reports that he feels weak and shortness of breath.  Denies any chest pain.  He does not feel any palpitations, but often does not feel them when he is in atrial fibrillation.'s been compliant with his medications.  Denies any known fevers.  No hemoptysis.    Past Medical History:  Diagnosis Date   Allergic rhinitis    Anxiety    Arthritis    Chronic indwelling Foley catheter    COPD (chronic obstructive pulmonary disease) (HCC)    Cough    Diabetes (HCC)    Essential (primary) hypertension    Hemorrhoid    Hyperlipidemia    PAF (paroxysmal atrial fibrillation) (HCC)    Primary osteoarthritis, unspecified shoulder    Retention of urine,  unspecified    Spinal stenosis    UTI (urinary tract infection)     Patient Active Problem List   Diagnosis Date Noted   Acute respiratory failure (HCC) 01/09/2021   Ileus (HCC)    Neck pain    AKI (acute kidney injury) (HCC)    Type 2 diabetes mellitus with diabetic neuropathy, without long-term current use of insulin (HCC)    Parkinson disease (HCC)    Sepsis (HCC) 10/16/2020   Chronic respiratory failure with hypoxia (HCC) 01/10/2020   Impaired functional mobility, balance, gait, and endurance 09/15/2019   Catheter cystitis (HCC) 10/16/2018   COVID-19 virus detected 07/03/2018   Benign prostatic hyperplasia with lower urinary tract symptoms 08/06/2017   Osteoarthritis of right knee 06/19/2017   S/P orthopedic surgery, follow-up exam 10/25/2016   Tendon rupture of wrist, sequela 01/02/2016   Acute pain of right knee 08/03/2015   Calcific tendinitis of left shoulder 07/05/2015   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/05/2015   Gait difficulty 06/26/2015   Severe mitral regurgitation 06/05/2015   AF (paroxysmal atrial fibrillation) (HCC) 05/31/2015   Chronic pain 05/31/2015   COPD with acute exacerbation (HCC) 05/31/2015   Essential hypertension 05/31/2015   Chronic pain of left wrist 04/24/2015   Arthritis of left wrist 04/24/2015   Rupture of extensor tendon of left hand 04/24/2015    Past Surgical History:  Procedure Laterality Date   APPENDECTOMY     BACK SURGERY  COLONOSCOPY N/A 10/27/2020   Procedure: COLONOSCOPY;  Surgeon: Wyline Mood, MD;  Location: [facility] ENDOSCOPY;  Service: Gastroenterology;  Laterality: N/A;   ELBOW SURGERY     ESOPHAGOGASTRODUODENOSCOPY (EGD) WITH PROPOFOL N/A 10/27/2020   Procedure: ESOPHAGOGASTRODUODENOSCOPY (EGD) WITH PROPOFOL;  Surgeon: Wyline Mood, MD;  Location: [facility] ENDOSCOPY;  Service: Gastroenterology;  Laterality: N/A;   HERNIA REPAIR     KNEE SURGERY     neck     PARTIAL HIP ARTHROPLASTY Bilateral    TEE WITHOUT CARDIOVERSION N/A  10/19/2020   Procedure: TRANSESOPHAGEAL ECHOCARDIOGRAM (TEE);  Surgeon: Antonieta Iba, MD;  Location: [facility] ORS;  Service: Cardiovascular;  Laterality: N/A;   TONSILLECTOMY      Prior to Admission medications   Medication Sig Start Date End Date Taking? Authorizing Provider  apixaban (ELIQUIS) 5 MG TABS tablet Take 5 mg by mouth 2 (two) times daily.  02/06/17  Yes [provider]  atorvastatin (LIPITOR) 20 MG tablet Take 20 mg by mouth daily.   Yes [provider]  BIOFREEZE 4 % GEL Apply 1 application topically 3 (three) times daily. 11/17/20  Yes [provider]  carbidopa-levodopa (SINEMET IR) 25-100 MG tablet Take 1 tablet by mouth 3 (three) times daily before meals. At 0630, 1130, 1630   Yes [provider]  Docusate Sodium (STOOL SOFTENER LAXATIVE PO) Take 2 tablets by mouth at bedtime.   Yes [provider]  escitalopram (LEXAPRO) 5 MG tablet Take 5 mg by mouth daily.   Yes [provider]  ferrous sulfate 325 (65 FE) MG tablet Take 1 tablet (325 mg total) by mouth 3 (three) times daily with meals. 11/01/20  Yes [name], MD  finasteride (PROSCAR) 5 MG tablet Take 5 mg by mouth daily.   Yes [provider]  gabapentin (NEURONTIN) 800 MG tablet Take 800 mg by mouth 2 (two) times daily.    Yes [provider]  ipratropium (ATROVENT) 0.03 % nasal spray Place 2 sprays into both nostrils 2 (two) times daily. 02/18/20  Yes [provider]  ipratropium-albuterol (DUONEB) 0.5-2.5 (3) MG/3ML SOLN Inhale 3 mLs into the lungs daily. And every 6 hours as needed for shortness of breath or wheezing   Yes [provider]  lactase (LACTAID) 3000 units tablet Take 3,000 Units by mouth 3 (three) times daily with meals.   Yes [provider]  methocarbamol (ROBAXIN) 500 MG tablet Take 1,000 mg by mouth in the morning and at bedtime.    Yes [provider]  metoprolol succinate (TOPROL-XL) 50 MG 24 hr  tablet Take 50 mg by mouth daily.    Yes [provider]  oxybutynin (DITROPAN-XL) 10 MG 24 hr tablet Take 1 tablet (10 mg total) by mouth daily. 11/01/20  Yes [name], MD  polyethylene glycol (MIRALAX / GLYCOLAX) 17 g packet Take 17 g by mouth daily.   Yes [provider]  psyllium (METAMUCIL) 58.6 % powder Take 1 packet by mouth daily.   Yes [provider]  simethicone (MYLICON) 125 MG chewable tablet Chew 125 mg by mouth 3 (three) times daily.   Yes [provider]  Skin Protectants, Misc. (MINERIN CREME EX) Apply topically. Apply to feet twice bid   Yes [provider]  torsemide (DEMADEX) 10 MG tablet Take 10 mg by mouth daily.   Yes [provider]  traMADol (ULTRAM) 50 MG tablet Take 100 mg by mouth every 8 (eight) hours as needed for moderate pain. 11/22/20  Yes [provider]  traZODone (DESYREL) 100 MG tablet Take 100 mg by mouth at bedtime.   Yes [provider]  TRELEGY ELLIPTA 100-62.5-25 MCG/INH AEPB Inhale 1 puff into the lungs daily. 02/17/20  Yes [provider]  triamcinolone cream (KENALOG) 0.1 % Apply 1 application topically 2 (two) times daily.   Yes [provider]  acetaminophen (TYLENOL) 325 MG tablet Take 650 mg by mouth every 4 (four) hours as needed.    [provider]  Albuterol Sulfate (PROAIR RESPICLICK) 108 (90 Base) MCG/ACT AEPB Inhale 2 puffs into the lungs every 4 (four) hours as needed (COPD).    [provider]  ANTI-DANDRUFF 1 % SHAM Apply topically. 02/18/20   [provider]  bisacodyl (DULCOLAX) 10 MG suppository Place 10 mg rectally as needed for moderate constipation.    [provider]  calcium carbonate (TUMS - DOSED IN MG ELEMENTAL CALCIUM) 500 MG chewable tablet Chew 2 tablets by mouth every 6 (six) hours as needed for indigestion or heartburn.    [provider]  carboxymethylcellulose (REFRESH PLUS) 0.5 % SOLN 1 drop as  needed. Patient not taking: Reported on 01/09/2021    [provider]  cetirizine (ZYRTEC) 10 MG tablet Take 10 mg by mouth daily.  Patient not taking: Reported on 01/09/2021    [provider]  clobetasol cream (TEMOVATE) 0.05 % Apply topically. Patient not taking: Reported on 01/09/2021 01/12/20   [provider]  cyclobenzaprine (FLEXERIL) 5 MG tablet Take by mouth. Patient not taking: No sig reported 11/06/20   [provider]  diclofenac Sodium (VOLTAREN) 1 % GEL Apply 4 g topically 4 (four) times daily. Patient not taking: Reported on 01/09/2021    [provider]  diphenhydrAMINE-zinc acetate (BENADRYL ITCH STOPPING) cream Apply topically every 4 (four) hours. Patient not taking: Reported on 01/09/2021    [provider]  Emollient (CETAPHIL) cream Apply 1 application topically as needed.    [provider]  Emollient (EUCERIN DAILY PROTECTION/SPF30) LOTN Apply topically as needed. Patient not taking: Reported on 01/09/2021    [provider]  feeding supplement, GLUCERNA SHAKE, (GLUCERNA SHAKE) LIQD Take 237 mLs by mouth 2 (two) times daily between meals. 11/01/20   [name], MD  fluticasone (FLONASE) 50 MCG/ACT nasal spray Place 1 spray into both nostrils daily.    [provider]  heparin flush 10 UNIT/ML SOLN injection Inject into the vein. Patient not taking: No sig reported 11/14/20   [provider]  hydrOXYzine (ATARAX/VISTARIL) 25 MG tablet Take 25 mg by mouth 2 (two) times daily as needed for itching. 02/17/20   [provider]  Lidocaine 3 % CREA Apply topically daily as needed. Patient not taking: Reported on 01/09/2021    [provider]  metFORMIN (GLUCOPHAGE-XR) 500 MG 24 hr tablet Take 500 mg by mouth daily.     [provider]  montelukast (SINGULAIR) 10 MG tablet Take 10 mg by mouth at bedtime. Patient not taking: Reported on 01/09/2021    [provider]  nystatin (MYCOSTATIN/NYSTOP) powder  10/21/18   [provider]  pantoprazole (PROTONIX) 40 MG tablet Take 1 tablet (40 mg total) by mouth 2 (two) times daily before a meal. For 8 weeks. 11/01/20 01/03/21  [name], MD  predniSONE (DELTASONE) 20 MG tablet Take 20 mg by mouth daily with breakfast. Patient not taking: Reported on 01/09/2021    [provider]  SARNA lotion Apply topically. 02/25/20   [provider]  senna-docusate (SENOKOT-S) 8.6-50 MG tablet Take by mouth. Patient not taking: Reported on 01/09/2021 10/21/18   [provider]  sulfamethoxazole-trimethoprim (BACTRIM DS) 800-160 MG tablet Take 1 tablet by mouth 2 (two) times daily. Patient not taking: No sig reported 01/08/21   [provider]  tamsulosin (FLOMAX) 0.4 MG CAPS capsule Take 0.4 mg by mouth 2 (two) times daily. Patient not taking: Reported on 01/09/2021    [provider]    Allergies Patient has no known allergies.  Family History  Problem Relation Age of Onset   Healthy Son    Healthy Son     Social History Social History   Tobacco Use   Smoking status: Former    Packs/day: 0.50    Years: 10.00    Pack years: 5.00    Types: Cigarettes    Quit date: 2006    Years since quitting: 16.7   Smokeless tobacco: Never  Vaping Use   Vaping Use: Never used  Substance Use Topics   Alcohol use: Not Currently   Drug use: Not Currently    Types: "Crack" cocaine    Comment: quit "crack" 15 years ago--11/04/2019    Review of Systems  Review of Systems  Constitutional:  Positive for fatigue. Negative for chills and fever.  HENT:  Negative for sore throat.   Respiratory:  Positive for cough and shortness of breath.   Cardiovascular:  Positive for leg swelling. Negative for chest pain.  Gastrointestinal:  Negative for abdominal pain.  Genitourinary:  Negative for flank pain.  Musculoskeletal:  Negative for neck pain.  Skin:  Negative for  rash and wound.  Allergic/Immunologic: Negative for immunocompromised state.  Neurological:  Positive for weakness. Negative for numbness.  Hematological:  Does not bruise/bleed easily.  All other systems reviewed and are negative.   ____________________________________________  PHYSICAL EXAM:      VITAL SIGNS: ED Triage Vitals  Enc Vitals Group     BP 01/09/21 1553 92/68     Pulse Rate 01/09/21 1553 (!) 132     Resp 01/09/21 1553 (!) 23     Temp --      Temp src --      SpO2 01/09/21 1551 98 %     Weight 01/09/21 1554 233 lb 3.2 oz (105.8 kg)     Height 01/09/21 1554 6' (1.829 m)     Head Circumference --      Peak Flow --      Pain Score 01/09/21 1554 0     Pain Loc --      Pain Edu? --      Excl. in GC? --      Physical Exam Vitals and nursing note reviewed.  Constitutional:      General: He is not in acute distress.    Appearance: He is well-developed.  HENT:     Head: Normocephalic and atraumatic.  Eyes:     Conjunctiva/sclera: Conjunctivae normal.  Cardiovascular:     Rate and Rhythm: Tachycardia present. Rhythm irregular.     Heart sounds: Normal heart sounds. No murmur heard.   No friction rub.  Pulmonary:     Effort: Tachypnea and accessory muscle usage present. No respiratory distress.     Breath sounds: Decreased air movement present. Examination of the right-middle field reveals wheezing. Examination of the left-middle field reveals wheezing. Examination of the right-lower field reveals wheezing, rhonchi and rales. Examination of the left-lower field reveals wheezing, rhonchi and rales. Wheezing, rhonchi and  rales present.  Abdominal:     General: There is no distension.     Palpations: Abdomen is soft.     Tenderness: There is no abdominal tenderness.  Musculoskeletal:     Cervical back: Neck supple.     Right lower leg: Edema (3+) present.     Left lower leg: Edema (2+) present.  Skin:    General: Skin is warm.     Capillary Refill: Capillary  refill takes less than 2 seconds.  Neurological:     Mental Status: He is alert and oriented to person, place, and time.     Motor: No abnormal muscle tone.      ____________________________________________   LABS (all labs ordered are listed, but only abnormal results are displayed)  Labs Reviewed  BLOOD GAS, VENOUS - Abnormal; Notable for the following components:      Result Value   Bicarbonate 30.2 (*)    Acid-Base Excess 4.0 (*)    All other components within normal limits  CBC WITH DIFFERENTIAL/PLATELET - Abnormal; Notable for the following components:   RBC 3.94 (*)    Hemoglobin 12.4 (*)    HCT 36.6 (*)    RDW 19.7 (*)    nRBC 0.4 (*)    Abs Immature Granulocytes 0.08 (*)    All other components within normal limits  COMPREHENSIVE METABOLIC PANEL - Abnormal; Notable for the following components:   Chloride 97 (*)    Glucose, Bld 157 (*)    BUN 28 (*)    Creatinine, Ser 1.35 (*)    Calcium 8.8 (*)    Albumin 3.2 (*)    AST 12 (*)    GFR, Estimated 54 (*)    All other components within normal limits  BRAIN NATRIURETIC PEPTIDE - Abnormal; Notable for the following components:   B Natriuretic Peptide 263.7 (*)    All other components within normal limits  D-DIMER, QUANTITATIVE - Abnormal; Notable for the following components:   D-Dimer, Quant 3.65 (*)    All other components within normal limits  LACTIC ACID, PLASMA - Abnormal; Notable for the following components:   Lactic Acid, Venous 2.7 (*)    All other components within normal limits  LACTIC ACID, PLASMA - Abnormal; Notable for the following components:   Lactic Acid, Venous 2.4 (*)    All other components within normal limits  TROPONIN I (HIGH SENSITIVITY) - Abnormal; Notable for the following components:   Troponin I (High Sensitivity) 22 (*)    All other components within normal limits  TROPONIN I (HIGH SENSITIVITY) - Abnormal; Notable for the following components:   Troponin I (High Sensitivity) 18 (*)     All other components within normal limits  CULTURE, BLOOD (SINGLE)  CULTURE, BLOOD (SINGLE)  MAGNESIUM  PROCALCITONIN  BASIC METABOLIC PANEL  CBC    ____________________________________________  EKG: Atrial fibrillation, rapid ventricular response.  Ventricular rate 134.  QRS 110, QTc 495.  No acute ST elevations or depressions.  Nonspecific ST changes, likely rate related. ________________________________________  RADIOLOGY All imaging, including plain films, CT scans, and ultrasounds, independently reviewed by me, and interpretations confirmed via formal radiology reads.  ED MD interpretation:   Chest x-ray: Low lung volumes, otherwise no acute cardiopulmonary process  Official radiology report(s): CT Angio Chest PE W and/or Wo Contrast  Result Date: 01/09/2021 CLINICAL DATA:  Wheezing and congestion. Atrial fibrillation. Hypoxia. Hypotension. COPD. EXAM: CT ANGIOGRAPHY CHEST WITH CONTRAST TECHNIQUE: Multidetector CT imaging of the chest was performed using the  standard protocol during bolus administration of intravenous contrast. Multiplanar CT image reconstructions and MIPs were obtained to evaluate the vascular anatomy. CONTRAST:  61mL OMNIPAQUE IOHEXOL 350 MG/ML SOLN COMPARISON:  Multiple exams, including CT chest 10/16/2020 FINDINGS: Cardiovascular: Marginal irregularities simulating chronic pulmonary embolus in the left upper lobe pulmonary artery on images 74 through 100 of series 6 correlate with substantial motion artifact and accordingly are likely artifactual in nature. No definite filling defect characteristic for acute pulmonary embolus is identified in the pulmonary arterial tree. Moderate cardiomegaly. Coronary, aortic arch, and branch vessel atherosclerotic vascular disease. Mitral valve clips noted. Mediastinum/Nodes: Prominence of adipose tissues in the upper mediastinum. Suspected fluid in the superior pericardial recess. 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 Dependent atelectasis in both lower lobes. Mild dependent atelectasis in the lingula. Trace left pleural effusion. Upper Abdomen: Stable hypodense 2.0 by 1.6 cm lesion in the right hepatic lobe on image 71 series 5. Dependent densities in the gallbladder suggesting cholelithiasis. Musculoskeletal: Prominent bilateral rotator cuff muscular atrophy. Substantial bilateral degenerative glenohumeral arthropathy with calcifications in the joint favoring chondrocalcinosis. Substantial bilateral sternoclavicular joint arthropathy. Scattered mild intramedullary sclerosis along some ribs, probably incidental. Old deformity of the left medial eleventh rib from an old healed fracture. Thoracic spondylosis. Lower cervical plate and screw fixator. Prominent multilevel thoracic degenerative disc disease multilevel impingement due to spurring. Review of the MIP images confirms the above findings. IMPRESSION: 1. No filling defect is identified in the pulmonary arterial tree to suggest pulmonary embolus. Reduced sensitivity T2 the degree of motion artifact. 2. 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 3. Mild atelectasis in both lower lobes and in the lingula. 4. Trace left pleural effusion. 5. Cardiomegaly with coronary and aortic atherosclerosis. Mitral valve clips noted. Aortic Atherosclerosis (ICD10-I70.0). 6. Substantial bilateral glenohumeral arthropathy. Prominent bilateral rotator cuff muscular atrophy. 7. Multilevel thoracic spine impingement due to spurring. Electronically Signed   By: Gaylyn Rong M.D.   On: 01/09/2021 18:38   DG Chest Portable 1 View  Result Date: 01/09/2021 CLINICAL DATA:  Shortness of breath, wheezing, congestion EXAM: PORTABLE CHEST 1 VIEW COMPARISON:  Chest radiograph 12/04/2020 FINDINGS: The heart appears mildly enlarged, though this is likely exaggerated by AP technique and low  lung volumes. The mediastinal contours are stable. There is calcified atherosclerotic plaque of the aortic arch. Lung volumes are low, with unchanged asymmetric elevation of the right hemidiaphragm. Linear opacities in the lateral left base likely reflect atelectasis or scar. There is no other focal consolidation. There is no pulmonary edema. There is no definite pneumothorax, though the apices are suboptimally evaluated. There is no acute osseous abnormality. IMPRESSION: Low lung volumes. Otherwise, no radiographic evidence of acute cardiopulmonary process. Electronically Signed   By: Lesia Hausen M.D.   On: 01/09/2021 16:24    ____________________________________________  PROCEDURES   Procedure(s) performed (including Critical Care):  .Critical Care Performed by: Shaune Pollack, MD Authorized by: Shaune Pollack, MD   Critical care provider statement:    Critical care time (minutes):  55   Critical care was necessary to treat or prevent imminent or life-threatening deterioration of the following conditions:  Cardiac failure, circulatory failure and respiratory failure   Critical care was time spent personally by me on the following activities:  Development of treatment plan with patient or surrogate, blood draw for specimens, evaluation of patient's response to treatment, examination of patient, interpretation of cardiac output measurements, ordering and performing treatments and interventions, ordering and  review of laboratory studies, ordering and review of radiographic studies, pulse oximetry, re-evaluation of patient's condition and review of old charts   I assumed direction of critical care for this patient from another provider in my specialty: no     Care discussed with: admitting provider   .1-3 Lead EKG Interpretation Performed by: Shaune Pollack, MD Authorized by: Shaune Pollack, MD     Interpretation: abnormal     ECG rate:  100-150   ECG rate assessment: tachycardic      Rhythm: atrial fibrillation     Ectopy: PVCs     Conduction: normal   Comments:     Indication: AFib RVR  ____________________________________________  INITIAL IMPRESSION / MDM / ASSESSMENT AND PLAN / ED COURSE  As part of my medical decision making, I reviewed the following data within the electronic MEDICAL RECORD NUMBER Nursing notes reviewed and incorporated, Old chart reviewed, Notes from prior ED visits, and [place] Controlled Substance Database       *Kaydence Baba was evaluated in Emergency Department on 01/09/2021 for the symptoms described in the history of present illness. He was evaluated in the context of the global COVID-19 pandemic, which necessitated consideration that the patient might be at risk for infection with the SARS-CoV-2 virus that causes COVID-19. Institutional protocols and algorithms that pertain to the evaluation of patients at risk for COVID-19 are in a state of rapid change based on information released by regulatory bodies including the CDC and federal and state organizations. These policies and algorithms were followed during the patient's care in the ED.  Some ED evaluations and interventions may be delayed as a result of limited staffing during the pandemic.*     Medical Decision Making: 75 year old male here with acute hypoxic respiratory distress.  I suspect multifactorial hypoxic respiratory failure due to ACS and troponin is likely related to demand.  Otherwise, given his unilateral leg swelling, CT angio obtained and reviewed, and shows no evidence of PE.  No evidence of significant pneumonia.  Pro-Cal is less than 0.25.  Lab work shows possible mild dehydration and this would fit with his mild hypotension for which she was given very cautious fluids.  He was started on empiric steroids as well as antibiotics for his COPD.  He was given a dose of diltiazem with improvement in his heart rate.  This will need to be cautiously given in the setting of his borderline  hypotension.  His QT is prolonged so will hold on amiodarone.  Admit to stepdown.  ____________________________________________  FINAL CLINICAL IMPRESSION(S) / ED DIAGNOSES  Final diagnoses:  COPD exacerbation (HCC)  Acute respiratory failure with hypoxia (HCC)  Atrial fibrillation with rapid ventricular response (HCC)     MEDICATIONS GIVEN DURING THIS VISIT:  Medications  sodium chloride 0.9 % bolus 500 mL (has no administration in time range)  traMADol (ULTRAM) tablet 100 mg (has no administration in time range)  atorvastatin (LIPITOR) tablet 20 mg (has no administration in time range)  metoprolol succinate (TOPROL-XL) 24 hr tablet 50 mg (has no administration in time range)  torsemide (DEMADEX) tablet 10 mg (has no administration in time range)  escitalopram (LEXAPRO) tablet 5 mg (has no administration in time range)  hydrOXYzine (ATARAX/VISTARIL) tablet 25 mg (has no administration in time range)  traZODone (DESYREL) tablet 100 mg (has no administration in time range)  bisacodyl (DULCOLAX) suppository 10 mg (has no administration in time range)  calcium carbonate (TUMS - dosed in mg elemental calcium) chewable tablet 400 mg  of elemental calcium (has no administration in time range)  senna-docusate (Senokot-S) tablet 1 tablet (has no administration in time range)  lactase (LACTAID) tablet 3,000 Units (has no administration in time range)  pantoprazole (PROTONIX) EC tablet 40 mg (has no administration in time range)  polyethylene glycol (MIRALAX / GLYCOLAX) packet 17 g (has no administration in time range)  psyllium (HYDROCIL/METAMUCIL) 1 packet (has no administration in time range)  simethicone (MYLICON) chewable tablet 80 mg (has no administration in time range)  finasteride (PROSCAR) tablet 5 mg (has no administration in time range)  oxybutynin (DITROPAN-XL) 24 hr tablet 10 mg (has no administration in time range)  apixaban (ELIQUIS) tablet 5 mg (has no administration in time  range)  ferrous sulfate tablet 325 mg (has no administration in time range)  carbidopa-levodopa (SINEMET IR) 25-100 MG per tablet immediate release 1 tablet (has no administration in time range)  gabapentin (NEURONTIN) tablet 800 mg (has no administration in time range)  methocarbamol (ROBAXIN) tablet 1,000 mg (has no administration in time range)  feeding supplement (GLUCERNA SHAKE) (GLUCERNA SHAKE) liquid 237 mL (has no administration in time range)  fluticasone (FLONASE) 50 MCG/ACT nasal spray 1 spray (has no administration in time range)  ipratropium (ATROVENT) 0.03 % nasal spray 2 spray (has no administration in time range)  Fluticasone-Umeclidin-Vilant 100-62.5-25 MCG/INH AEPB 1 puff (has no administration in time range)  Menthol (Topical Analgesic) 4 % GEL 1 application (has no administration in time range)  cefTRIAXone (ROCEPHIN) 1 g in sodium chloride 0.9 % 100 mL IVPB (has no administration in time range)  methylPREDNISolone sodium succinate (SOLU-MEDROL) 125 mg/2 mL injection 80 mg (has no administration in time range)    Followed by  predniSONE (DELTASONE) tablet 40 mg (has no administration in time range)  furosemide (LASIX) injection 20 mg (has no administration in time range)  acetaminophen (TYLENOL) tablet 650 mg (has no administration in time range)    Or  acetaminophen (TYLENOL) suppository 650 mg (has no administration in time range)  magnesium hydroxide (MILK OF MAGNESIA) suspension 30 mL (has no administration in time range)  ondansetron (ZOFRAN) tablet 4 mg (has no administration in time range)    Or  ondansetron (ZOFRAN) injection 4 mg (has no administration in time range)  ipratropium-albuterol (DUONEB) 0.5-2.5 (3) MG/3ML nebulizer solution 3 mL (has no administration in time range)  guaiFENesin (MUCINEX) 12 hr tablet 600 mg (has no administration in time range)  chlorpheniramine-HYDROcodone (TUSSIONEX) 10-8 MG/5ML suspension 5 mL (has no administration in time range)   ipratropium-albuterol (DUONEB) 0.5-2.5 (3) MG/3ML nebulizer solution 3 mL (3 mLs Nebulization Given 01/09/21 1602)  ipratropium-albuterol (DUONEB) 0.5-2.5 (3) MG/3ML nebulizer solution 3 mL (3 mLs Nebulization Given 01/09/21 1602)  methylPREDNISolone sodium succinate (SOLU-MEDROL) 125 mg/2 mL injection 125 mg (125 mg Intravenous Given 01/09/21 1603)  sodium chloride 0.9 % bolus 500 mL (0 mLs Intravenous Stopped 01/09/21 1710)  diltiazem (CARDIZEM) injection 10 mg (10 mg Intravenous Given 01/09/21 1636)  iohexol (OMNIPAQUE) 350 MG/ML injection 75 mL (75 mLs Intravenous Contrast Given 01/09/21 1742)  sodium chloride 0.9 % bolus 500 mL (0 mLs Intravenous Stopped 01/09/21 2001)  cefTRIAXone (ROCEPHIN) 2 g in sodium chloride 0.9 % 100 mL IVPB (0 g Intravenous Stopped 01/09/21 1804)  azithromycin (ZITHROMAX) 500 mg in sodium chloride 0.9 % 250 mL IVPB (0 mg Intravenous Stopped 01/09/21 2002)     ED Discharge Orders     None        Note:  This document was prepared  using Conservation officer, historic buildings and may include unintentional dictation errors.   Shaune Pollack, MD 01/09/21 2024

## 2021-01-09 NOTE — ED Notes (Signed)
Report received from Heather, RN.

## 2021-01-09 NOTE — ED Notes (Signed)
Lab called critical lactic of 2.7. EDP notified.

## 2021-01-09 NOTE — ED Triage Notes (Signed)
Pt arrives via [facility] from [facility].  Pt has audible wheezing and congestion with breathing, pt reports unproductive cough.  Hx Afib and COPD. EMS reports O2 88 on RA, HR 130, Bp dropping.

## 2021-01-09 NOTE — ED Notes (Signed)
RT at bedside administering neb TX

## 2021-01-09 NOTE — ED Notes (Signed)
Pt taken to CT at this time by Respiratory Therapist Herbert Seta and RN Diannia Ruder.

## 2021-01-09 NOTE — H&P (Addendum)
[place]   PATIENT NAME: Raymond Hays    MR#:  [id number]  DATE OF BIRTH:  15-Jan-1946  DATE OF ADMISSION:  01/09/2021  PRIMARY CARE PHYSICIAN: [facility]   Patient is coming from: Home  REQUESTING/REFERRING PHYSICIAN: Shaune Pollack, MD  CHIEF COMPLAINT:   Chief Complaint  Patient presents with  . Respiratory Distress    HISTORY OF PRESENT ILLNESS:  Raymond Hays is a 75 y.o. Caucasian male with medical history significant for multiple medical problems that are mentioned below, including COPD, type 2 diabetes mellitus, paroxysmal atrial fibrillation, dyslipidemia, osteoarthritis and hypertension, who presented to the emergency room with acute onset of respiratory distress.  The patient has been having worsening dyspnea over the last several days with associated wheezing, dry cough and chest congestion with inability to expectorate sputum.  He admits to dyspnea on exertion and currently at rest.  His pulse oximetry was in the mid 80s upon EMS arrival and he was in significant respiratory distress he was placed on BiPAP.  He was noted to be in atrial fibrillation with RVR.  No chest pain or palpitations.  He denied any fever or chills.  He had nausea and vomiting once yesterday.  No diarrhea.  No bilious vomitus or hematemesis.  He has been having significant lower extremity edema more on the right than the left.  No dysuria, oliguria, urinary frequency or urgency or flank pain.  No fever or chills.  ED Course: When the patient came to the ER, blood pressure was 92/68 with a heart rate of 132 and respiratory rate of 23 and pulse oximetry was 98-100% on BiPAP. After IV Cardizem heart rate has been down to 106 and later 117 then 109.  Labs revealed VBG with pH 7.38 and HCO3 of 30.2.  CMP revealed a blood glucose of 157, BUN of 28 and creatinine 1.35 magnesium 1.9 potassium 3.9, albumin of 3.2 total bili of 6.6.  BNP was 263.7 and high-sensitivity troponin I 22 and later 18.   Lactic acid was 2.7 and later 2.4 procalcitonin was 0.22 and CBC showed anemia close to baseline.  Blood cultures were drawn.  D-dimer was 3.65 EKG as reviewed by me : EKG showed atrial fibrillation with rapid ventricular sponsor 134 with incomplete left bundle branch block and borderline prolonged QT interval with QTC of 495 MS. Imaging: Portable chest ray showed low lung volumes with no acute cardiopulmonary disease.  Chest CTA showed the following: 1. No filling defect is identified in the pulmonary arterial tree to suggest pulmonary embolus. Reduced sensitivity T2 the degree of motion artifact. 2. 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 3. Mild atelectasis in both lower lobes and in the lingula. 4. Trace left pleural effusion. 5. Cardiomegaly with coronary and aortic atherosclerosis. Mitral valve clips noted. Aortic Atherosclerosis (ICD10-I70.0). 6. Substantial bilateral glenohumeral arthropathy. Prominent bilateral rotator cuff muscular atrophy. 7. Multilevel thoracic spine impingement due to spurring.  The patient was given IV Rocephin and Zithromax, 10 g of IV diltiazem, 2 DuoNeb's, 125 mg IV Solu-Medrol and IV normal saline bolus 500 mL twice.  He was admitted to a stepdown unit bed for further evaluation and management.  PAST MEDICAL HISTORY:   Past Medical History:  Diagnosis Date  . Allergic rhinitis   . Anxiety   . Arthritis   . Chronic indwelling Foley catheter   . COPD (chronic obstructive pulmonary disease) (HCC)   . Cough   . Diabetes (HCC)   .  Essential (primary) hypertension   . Hemorrhoid   . Hyperlipidemia   . PAF (paroxysmal atrial fibrillation) (HCC)   . Primary osteoarthritis, unspecified shoulder   . Retention of urine, unspecified   . Spinal stenosis   . UTI (urinary tract infection)     PAST SURGICAL HISTORY:   Past Surgical History:  Procedure Laterality Date  . APPENDECTOMY    . BACK SURGERY    .  COLONOSCOPY N/A 10/27/2020   Procedure: COLONOSCOPY;  Surgeon: Wyline Mood, MD;  Location: [facility] ENDOSCOPY;  Service: Gastroenterology;  Laterality: N/A;  . ELBOW SURGERY    . ESOPHAGOGASTRODUODENOSCOPY (EGD) WITH PROPOFOL N/A 10/27/2020   Procedure: ESOPHAGOGASTRODUODENOSCOPY (EGD) WITH PROPOFOL;  Surgeon: Wyline Mood, MD;  Location: [facility] ENDOSCOPY;  Service: Gastroenterology;  Laterality: N/A;  . HERNIA REPAIR    . KNEE SURGERY    . neck    . PARTIAL HIP ARTHROPLASTY Bilateral   . TEE WITHOUT CARDIOVERSION N/A 10/19/2020   Procedure: TRANSESOPHAGEAL ECHOCARDIOGRAM (TEE);  Surgeon: Antonieta Iba, MD;  Location: [facility] ORS;  Service: Cardiovascular;  Laterality: N/A;  . TONSILLECTOMY      SOCIAL HISTORY:   Social History   Tobacco Use  . Smoking status: Former    Packs/day: 0.50    Years: 10.00    Pack years: 5.00    Types: Cigarettes    Quit date: 2006    Years since quitting: 16.7  . Smokeless tobacco: Never  Substance Use Topics  . Alcohol use: Not Currently    FAMILY HISTORY:   Family History  Problem Relation Age of Onset  . Healthy Son   . Healthy Son     DRUG ALLERGIES:  No Known Allergies  REVIEW OF SYSTEMS:   ROS As per history of present illness. All pertinent systems were reviewed above. Constitutional, HEENT, cardiovascular, respiratory, GI, GU, musculoskeletal, neuro, psychiatric, endocrine, integumentary and hematologic systems were reviewed and are otherwise negative/unremarkable except for positive findings mentioned above in the HPI.   MEDICATIONS AT HOME:   Prior to Admission medications   Medication Sig Start Date End Date Taking? Authorizing Provider  apixaban (ELIQUIS) 5 MG TABS tablet Take 5 mg by mouth 2 (two) times daily.  02/06/17  Yes [provider]  atorvastatin (LIPITOR) 20 MG tablet Take 20 mg by mouth daily.   Yes [provider]  BIOFREEZE 4 % GEL Apply 1 application topically 3 (three) times daily. 11/17/20  Yes  [provider]  carbidopa-levodopa (SINEMET IR) 25-100 MG tablet Take 1 tablet by mouth 3 (three) times daily before meals. At 0630, 1130, 1630   Yes [provider]  Docusate Sodium (STOOL SOFTENER LAXATIVE PO) Take 2 tablets by mouth at bedtime.   Yes [provider]  escitalopram (LEXAPRO) 5 MG tablet Take 5 mg by mouth daily.   Yes [provider]  ferrous sulfate 325 (65 FE) MG tablet Take 1 tablet (325 mg total) by mouth 3 (three) times daily with meals. 11/01/20  Yes [name], MD  finasteride (PROSCAR) 5 MG tablet Take 5 mg by mouth daily.   Yes [provider]  gabapentin (NEURONTIN) 800 MG tablet Take 800 mg by mouth 2 (two) times daily.    Yes [provider]  ipratropium (ATROVENT) 0.03 % nasal spray Place 2 sprays into both nostrils 2 (two) times daily. 02/18/20  Yes [provider]  ipratropium-albuterol (DUONEB) 0.5-2.5 (3) MG/3ML SOLN Inhale 3 mLs into the lungs daily. And every 6 hours as needed  for shortness of breath or wheezing   Yes [provider]  lactase (LACTAID) 3000 units tablet Take 3,000 Units by mouth 3 (three) times daily with meals.   Yes [provider]  methocarbamol (ROBAXIN) 500 MG tablet Take 1,000 mg by mouth in the morning and at bedtime.    Yes [provider]  metoprolol succinate (TOPROL-XL) 50 MG 24 hr tablet Take 50 mg by mouth daily.    Yes [provider]  oxybutynin (DITROPAN-XL) 10 MG 24 hr tablet Take 1 tablet (10 mg total) by mouth daily. 11/01/20  Yes [name], MD  polyethylene glycol (MIRALAX / GLYCOLAX) 17 g packet Take 17 g by mouth daily.   Yes [provider]  psyllium (METAMUCIL) 58.6 % powder Take 1 packet by mouth daily.   Yes [provider]  simethicone (MYLICON) 125 MG chewable tablet Chew 125 mg by mouth 3 (three) times daily.   Yes [provider]  Skin Protectants, Misc. (MINERIN CREME EX) Apply topically. Apply to  feet twice bid   Yes [provider]  torsemide (DEMADEX) 10 MG tablet Take 10 mg by mouth daily.   Yes [provider]  traMADol (ULTRAM) 50 MG tablet Take 100 mg by mouth every 8 (eight) hours as needed for moderate pain. 11/22/20  Yes [provider]  traZODone (DESYREL) 100 MG tablet Take 100 mg by mouth at bedtime.   Yes [provider]  TRELEGY ELLIPTA 100-62.5-25 MCG/INH AEPB Inhale 1 puff into the lungs daily. 02/17/20  Yes [provider]  triamcinolone cream (KENALOG) 0.1 % Apply 1 application topically 2 (two) times daily.   Yes [provider]  acetaminophen (TYLENOL) 325 MG tablet Take 650 mg by mouth every 4 (four) hours as needed.    [provider]  Albuterol Sulfate (PROAIR RESPICLICK) 108 (90 Base) MCG/ACT AEPB Inhale 2 puffs into the lungs every 4 (four) hours as needed (COPD).    [provider]  ANTI-DANDRUFF 1 % SHAM Apply topically. 02/18/20   [provider]  bisacodyl (DULCOLAX) 10 MG suppository Place 10 mg rectally as needed for moderate constipation.    [provider]  calcium carbonate (TUMS - DOSED IN MG ELEMENTAL CALCIUM) 500 MG chewable tablet Chew 2 tablets by mouth every 6 (six) hours as needed for indigestion or heartburn.    [provider]  carboxymethylcellulose (REFRESH PLUS) 0.5 % SOLN 1 drop as needed. Patient not taking: Reported on 01/09/2021    [provider]  cetirizine (ZYRTEC) 10 MG tablet Take 10 mg by mouth daily.  Patient not taking: Reported on 01/09/2021    [provider]  clobetasol cream (TEMOVATE) 0.05 % Apply topically. Patient not taking: Reported on 01/09/2021 01/12/20   [provider]  cyclobenzaprine (FLEXERIL) 5 MG tablet Take by mouth. Patient not taking: No sig reported 11/06/20   [provider]  diclofenac Sodium (VOLTAREN) 1 % GEL Apply 4 g topically 4 (four) times daily. Patient not taking:  Reported on 01/09/2021    [provider]  diphenhydrAMINE-zinc acetate (BENADRYL ITCH STOPPING) cream Apply topically every 4 (four) hours. Patient not taking: Reported on 01/09/2021    [provider]  Emollient (CETAPHIL) cream Apply 1 application topically as needed.    [provider]  Emollient (EUCERIN DAILY PROTECTION/SPF30) LOTN Apply topically as needed. Patient not taking: Reported on 01/09/2021    [provider]  feeding supplement, GLUCERNA SHAKE, (GLUCERNA SHAKE) LIQD Take 237 mLs  by mouth 2 (two) times daily between meals. 11/01/20   [name], MD  fluticasone (FLONASE) 50 MCG/ACT nasal spray Place 1 spray into both nostrils daily.    [provider]  heparin flush 10 UNIT/ML SOLN injection Inject into the vein. Patient not taking: No sig reported 11/14/20   [provider]  hydrOXYzine (ATARAX/VISTARIL) 25 MG tablet Take 25 mg by mouth 2 (two) times daily as needed for itching. 02/17/20   [provider]  Lidocaine 3 % CREA Apply topically daily as needed. Patient not taking: Reported on 01/09/2021    [provider]  metFORMIN (GLUCOPHAGE-XR) 500 MG 24 hr tablet Take 500 mg by mouth daily.     [provider]  montelukast (SINGULAIR) 10 MG tablet Take 10 mg by mouth at bedtime. Patient not taking: Reported on 01/09/2021    [provider]  nystatin (MYCOSTATIN/NYSTOP) powder  10/21/18   [provider]  pantoprazole (PROTONIX) 40 MG tablet Take 1 tablet (40 mg total) by mouth 2 (two) times daily before a meal. For 8 weeks. 11/01/20 01/03/21  [name], MD  predniSONE (DELTASONE) 20 MG tablet Take 20 mg by mouth daily with breakfast. Patient not taking: Reported on 01/09/2021    [provider]  SARNA lotion Apply topically. 02/25/20   [provider]  senna-docusate (SENOKOT-S) 8.6-50 MG tablet Take by mouth. Patient not taking: Reported on 01/09/2021 10/21/18    [provider]  sulfamethoxazole-trimethoprim (BACTRIM DS) 800-160 MG tablet Take 1 tablet by mouth 2 (two) times daily. Patient not taking: No sig reported 01/08/21   [provider]  tamsulosin (FLOMAX) 0.4 MG CAPS capsule Take 0.4 mg by mouth 2 (two) times daily. Patient not taking: Reported on 01/09/2021    [provider]      VITAL SIGNS:  Blood pressure 109/78, pulse (!) 110, temperature 97.6 F (36.4 C), temperature source Axillary, resp. rate 16, height 6' (1.829 m), weight 105.8 kg, SpO2 100 %.  PHYSICAL EXAMINATION:  Physical Exam  GENERAL: Acutely ill 75 y.o.-year-old Caucasian male patient lying in the bed in respiratory distress on BiPAP. EYES: Pupils equal, round, reactive to light and accommodation. No scleral icterus. Extraocular muscles intact.  HEENT: Head atraumatic, normocephalic. Oropharynx and nasopharynx clear.  NECK:  Supple, no jugular venous distention. No thyroid enlargement, no tenderness.  LUNGS: Diminished bibasilar breath sounds with diffuse expiratory wheezes and rhonchi and diminished expiratory airflow as well as bibasal rales. CARDIOVASCULAR: Regular rate and rhythm, S1, S2 normal. No murmurs, rubs, or gallops.  ABDOMEN: Soft, nondistended, nontender. Bowel sounds present. No organomegaly or mass.  EXTREMITIES: 3-4+ soft pitting bilateral lower extremity edema more on the right than the left with no cyanosis, or clubbing.  NEUROLOGIC: Cranial nerves II through XII are intact. Muscle strength 5/5 in all extremities. Sensation intact. Gait not checked.  PSYCHIATRIC: The patient is alert and oriented x 3.  Normal affect and good eye contact. SKIN: No obvious rash, lesion, or ulcer.   LABORATORY PANEL:   CBC Recent Labs  Lab 01/09/21 1547  WBC 8.2  HGB 12.4*  HCT 36.6*  PLT 235   ------------------------------------------------------------------------------------------------------------------  Chemistries  Recent  Labs  Lab 01/09/21 1547  NA 137  K 3.9  CL 97*  CO2 30  GLUCOSE 157*  BUN 28*  CREATININE 1.35*  CALCIUM 8.8*  MG 1.9  AST 12*  ALT 8  ALKPHOS 67  BILITOT 1.0   ------------------------------------------------------------------------------------------------------------------  Cardiac Enzymes No results for input(s):  TROPONINI in the last 168 hours. ------------------------------------------------------------------------------------------------------------------  RADIOLOGY:  CT Angio Chest PE W and/or Wo Contrast  Result Date: 01/09/2021 CLINICAL DATA:  Wheezing and congestion. Atrial fibrillation. Hypoxia. Hypotension. COPD. EXAM: CT ANGIOGRAPHY CHEST WITH CONTRAST TECHNIQUE: Multidetector CT imaging of the chest was performed using the standard protocol during bolus administration of intravenous contrast. Multiplanar CT image reconstructions and MIPs were obtained to evaluate the vascular anatomy. CONTRAST:  30mL OMNIPAQUE IOHEXOL 350 MG/ML SOLN COMPARISON:  Multiple exams, including CT chest 10/16/2020 FINDINGS: Cardiovascular: Marginal irregularities simulating chronic pulmonary embolus in the left upper lobe pulmonary artery on images 74 through 100 of series 6 correlate with substantial motion artifact and accordingly are likely artifactual in nature. No definite filling defect characteristic for acute pulmonary embolus is identified in the pulmonary arterial tree. Moderate cardiomegaly. Coronary, aortic arch, and branch vessel atherosclerotic vascular disease. Mitral valve clips noted. Mediastinum/Nodes: Prominence of adipose tissues in the upper mediastinum. Suspected fluid in the superior pericardial recess. 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 Dependent atelectasis in both lower lobes. Mild dependent atelectasis in the lingula. Trace left pleural effusion. Upper Abdomen: Stable hypodense 2.0 by 1.6 cm lesion  in the right hepatic lobe on image 71 series 5. Dependent densities in the gallbladder suggesting cholelithiasis. Musculoskeletal: Prominent bilateral rotator cuff muscular atrophy. Substantial bilateral degenerative glenohumeral arthropathy with calcifications in the joint favoring chondrocalcinosis. Substantial bilateral sternoclavicular joint arthropathy. Scattered mild intramedullary sclerosis along some ribs, probably incidental. Old deformity of the left medial eleventh rib from an old healed fracture. Thoracic spondylosis. Lower cervical plate and screw fixator. Prominent multilevel thoracic degenerative disc disease multilevel impingement due to spurring. Review of the MIP images confirms the above findings. IMPRESSION: 1. No filling defect is identified in the pulmonary arterial tree to suggest pulmonary embolus. Reduced sensitivity T2 the degree of motion artifact. 2. 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 3. Mild atelectasis in both lower lobes and in the lingula. 4. Trace left pleural effusion. 5. Cardiomegaly with coronary and aortic atherosclerosis. Mitral valve clips noted. Aortic Atherosclerosis (ICD10-I70.0). 6. Substantial bilateral glenohumeral arthropathy. Prominent bilateral rotator cuff muscular atrophy. 7. Multilevel thoracic spine impingement due to spurring. Electronically Signed   By: Gaylyn Rong M.D.   On: 01/09/2021 18:38   DG Chest Portable 1 View  Result Date: 01/09/2021 CLINICAL DATA:  Shortness of breath, wheezing, congestion EXAM: PORTABLE CHEST 1 VIEW COMPARISON:  Chest radiograph 12/04/2020 FINDINGS: The heart appears mildly enlarged, though this is likely exaggerated by AP technique and low lung volumes. The mediastinal contours are stable. There is calcified atherosclerotic plaque of the aortic arch. Lung volumes are low, with unchanged asymmetric elevation of the right hemidiaphragm. Linear opacities in the lateral left  base likely reflect atelectasis or scar. There is no other focal consolidation. There is no pulmonary edema. There is no definite pneumothorax, though the apices are suboptimally evaluated. There is no acute osseous abnormality. IMPRESSION: Low lung volumes. Otherwise, no radiographic evidence of acute cardiopulmonary process. Electronically Signed   By: Lesia Hausen M.D.   On: 01/09/2021 16:24      IMPRESSION AND PLAN:  Active Problems:   Acute respiratory failure (HCC)  1.  Acute hypoxic respiratory failure requiring BiPAP, due to COPD acute exacerbation and possibly acute likely diastolic CHF. - The patient will be admitted to a stepdown unit bed. - We will continue steroid therapy with IV Solu-Medrol. - We will continue bronchodilator therapy with DuoNebs 4  times daily and every 4 hours as needed. - Mucolytic therapy will be provided. - We will gently diurese with IV Lasix given borderline hypotension. - We will place him on IV Rocephin and Zithromax specially given elevated lactic acid level. - We will follow blood cultures. - We will hold off Trelegy for now. - I do not believe that the patient is septic with no leukocytosis or fever.  His initial tachycardia and tachypnea are likely secondary to his COPD exacerbation and may be new onset CHF given elevated BNP. - The patient's last 2D echo was on 10/19/2020 revealing an EF of 60 to 65% with moderately enlarged right ventricular size, moderate tricuspid regurgitation and with a small to moderately sized atrial septal defect and severe left atrial dilatation.  2.  Atrial fibrillation with rapid ventricular response. - The patient's heart rate has been fairly controlled after IV Cardizem bolus and being placed on BiPAP. - We will continue Eliquis.  3.  Mild acute kidney injury. - This likely prerenal secondary to acute CHF. - We will follow BMP with diuresis.  4.  Parkinson's disease. - We will continue Sinemet IR.  5.   Dyslipidemia. - Continue statin therapy.  6.  Paroxysmal atrial fibrillation. - We will continue Eliquis.  7.  Type 2 diabetes mellitus with peripheral neuropathy. - We will place the patient on supplemental coverage with NovoLog. - We will hold off metformin. - We will continue Neurontin.  8.  GERD. - Continue PPI therapy.  9.  BPH. - We will continue Flomax.  10.  Bilateral lower extremity edema more on the right than the left with elevated D-dimer. - We will obtain bilateral lower extremity venous Doppler.  DVT prophylaxis: Eliquis. Code Status: full code.  Family Communication:  The plan of care was discussed in details with the patient (and family). I answered all questions. The patient agreed to proceed with the above mentioned plan. Further management will depend upon hospital course. Disposition Plan: Back to previous home environment Consults called: Cardiology. All the records are reviewed and case discussed with ED provider.  Status is: Inpatient  Remains inpatient appropriate because:Hemodynamically unstable, Ongoing diagnostic testing needed not appropriate for outpatient work up, Unsafe d/c plan, IV treatments appropriate due to intensity of illness or inability to take PO, and Inpatient level of care appropriate due to severity of illness  Dispo: The patient is from: Home              Anticipated d/c is to: Home              Patient currently is not medically stable to d/c.   Difficult to place patient No   Authorized and performed by: Valente David, MD Total critical care time: Approximately  60     minutes. Due to a high probability of clinically significant, life-threatening deterioration, the patient required my highest level of preparedness to intervene emergently and I personally spent this critical care time directly and personally managing the patient.  This critical care time included obtaining a history, examining the patient, pulse oximetry, ordering and  review of studies, arranging urgent treatment with development of management plan, evaluation of patient's response to treatment, frequent reassessment, and discussions with other providers. This critical care time was performed to assess and manage the high probability of imminent, life-threatening deterioration that could result in multiorgan failure.  It was exclusive of separately billable procedures and treating other patients and teaching time.  Please see MDM section and  the rest of the note for further information on patient assessment and treatment.   Hannah Beat M.D on 01/09/2021 at 8:04 PM  [facility]   From 7 PM-7 AM, contact night-coverage [email or web address]  CC: Primary care physician; [facility]

## 2021-01-09 NOTE — ED Notes (Signed)
PO medications held due to ASPIRATION RISK

## 2021-01-09 NOTE — ED Notes (Signed)
Bipap mask removed for a few minutes to provide pt with a few sips of water. Sats maintained at 97-100% during this time. Wiped face with a warm washcloth and applied mouth moisturizer. Pt has no needs at this time. VS are stable.

## 2021-01-10 ENCOUNTER — Encounter: Payer: Self-pay | Admitting: Family Medicine

## 2021-01-10 ENCOUNTER — Inpatient Hospital Stay: Payer: No Typology Code available for payment source

## 2021-01-10 DIAGNOSIS — J9601 Acute respiratory failure with hypoxia: Secondary | ICD-10-CM | POA: Diagnosis not present

## 2021-01-10 LAB — CBC WITH DIFFERENTIAL/PLATELET
Abs Immature Granulocytes: 0.09 10*3/uL — ABNORMAL HIGH (ref 0.00–0.07)
Basophils Absolute: 0 10*3/uL (ref 0.0–0.1)
Basophils Relative: 0 %
Eosinophils Absolute: 0 10*3/uL (ref 0.0–0.5)
Eosinophils Relative: 0 %
HCT: 37.3 % — ABNORMAL LOW (ref 39.0–52.0)
Hemoglobin: 12.7 g/dL — ABNORMAL LOW (ref 13.0–17.0)
Immature Granulocytes: 1 %
Lymphocytes Relative: 5 %
Lymphs Abs: 0.4 10*3/uL — ABNORMAL LOW (ref 0.7–4.0)
MCH: 31.7 pg (ref 26.0–34.0)
MCHC: 34 g/dL (ref 30.0–36.0)
MCV: 93 fL (ref 80.0–100.0)
Monocytes Absolute: 0.3 10*3/uL (ref 0.1–1.0)
Monocytes Relative: 3 %
Neutro Abs: 8.1 10*3/uL — ABNORMAL HIGH (ref 1.7–7.7)
Neutrophils Relative %: 91 %
Platelets: 273 10*3/uL (ref 150–400)
RBC: 4.01 MIL/uL — ABNORMAL LOW (ref 4.22–5.81)
RDW: 19.9 % — ABNORMAL HIGH (ref 11.5–15.5)
WBC: 8.9 10*3/uL (ref 4.0–10.5)
nRBC: 0.4 % — ABNORMAL HIGH (ref 0.0–0.2)

## 2021-01-10 LAB — BASIC METABOLIC PANEL
Anion gap: 9 (ref 5–15)
BUN: 26 mg/dL — ABNORMAL HIGH (ref 8–23)
CO2: 29 mmol/L (ref 22–32)
Calcium: 8.7 mg/dL — ABNORMAL LOW (ref 8.9–10.3)
Chloride: 99 mmol/L (ref 98–111)
Creatinine, Ser: 1.26 mg/dL — ABNORMAL HIGH (ref 0.61–1.24)
GFR, Estimated: 59 mL/min — ABNORMAL LOW (ref >60)
Glucose, Bld: 181 mg/dL — ABNORMAL HIGH (ref 70–99)
Potassium: 4.4 mmol/L (ref 3.5–5.1)
Sodium: 137 mmol/L (ref 135–145)

## 2021-01-10 LAB — COMPREHENSIVE METABOLIC PANEL
ALT: 14 U/L (ref 0–44)
AST: 18 U/L (ref 15–41)
Albumin: 3 g/dL — ABNORMAL LOW (ref 3.5–5.0)
Alkaline Phosphatase: 95 U/L (ref 38–126)
Anion gap: 13 (ref 5–15)
BUN: 29 mg/dL — ABNORMAL HIGH (ref 8–23)
CO2: 29 mmol/L (ref 22–32)
Calcium: 8.8 mg/dL — ABNORMAL LOW (ref 8.9–10.3)
Chloride: 94 mmol/L — ABNORMAL LOW (ref 98–111)
Creatinine, Ser: 1.31 mg/dL — ABNORMAL HIGH (ref 0.61–1.24)
GFR, Estimated: 56 mL/min — ABNORMAL LOW (ref >60)
Glucose, Bld: 183 mg/dL — ABNORMAL HIGH (ref 70–99)
Potassium: 3.8 mmol/L (ref 3.5–5.1)
Sodium: 136 mmol/L (ref 135–145)
Total Bilirubin: 0.7 mg/dL (ref 0.3–1.2)
Total Protein: 6.4 g/dL — ABNORMAL LOW (ref 6.5–8.1)

## 2021-01-10 LAB — RESP PANEL BY RT-PCR (FLU A&B, COVID) ARPGX2
Influenza A by PCR: NEGATIVE
Influenza B by PCR: NEGATIVE
SARS Coronavirus 2 by RT PCR: NEGATIVE

## 2021-01-10 LAB — CBC
HCT: 36.9 % — ABNORMAL LOW (ref 39.0–52.0)
Hemoglobin: 12.1 g/dL — ABNORMAL LOW (ref 13.0–17.0)
MCH: 30.7 pg (ref 26.0–34.0)
MCHC: 32.8 g/dL (ref 30.0–36.0)
MCV: 93.7 fL (ref 80.0–100.0)
Platelets: 223 10*3/uL (ref 150–400)
RBC: 3.94 MIL/uL — ABNORMAL LOW (ref 4.22–5.81)
RDW: 19.8 % — ABNORMAL HIGH (ref 11.5–15.5)
WBC: 5.6 10*3/uL (ref 4.0–10.5)
nRBC: 0.4 % — ABNORMAL HIGH (ref 0.0–0.2)

## 2021-01-10 LAB — CBG MONITORING, ED
Glucose-Capillary: 188 mg/dL — ABNORMAL HIGH (ref 70–99)
Glucose-Capillary: 158 mg/dL — ABNORMAL HIGH (ref 70–99)
Glucose-Capillary: 182 mg/dL — ABNORMAL HIGH (ref 70–99)
Glucose-Capillary: 198 mg/dL — ABNORMAL HIGH (ref 70–99)
Glucose-Capillary: 185 mg/dL — ABNORMAL HIGH (ref 70–99)

## 2021-01-10 LAB — HEMOGLOBIN A1C
Hgb A1c MFr Bld: 6.7 % — ABNORMAL HIGH (ref 4.8–5.6)
Mean Plasma Glucose: 146 mg/dL

## 2021-01-10 LAB — LACTIC ACID, PLASMA: Lactic Acid, Venous: 3 mmol/L (ref 0.5–1.9)

## 2021-01-10 MED ORDER — DILTIAZEM HCL 25 MG/5ML IV SOLN: 15.0000 mg | Freq: Once | INTRAVENOUS | Status: AC

## 2021-01-10 MED ORDER — INSULIN ASPART 100 UNIT/ML IJ SOLN
0.0000 [IU] | Freq: Three times a day (TID) | INTRAMUSCULAR | Status: DC
  Administered 2021-01-10 (×5): 3 [IU] via SUBCUTANEOUS
  Administered 2021-01-11: 2 [IU] via SUBCUTANEOUS
  Administered 2021-01-11 – 2021-01-12 (×6): 3 [IU] via SUBCUTANEOUS
  Administered 2021-01-12: 2 [IU] via SUBCUTANEOUS
  Administered 2021-01-13: 3 [IU] via SUBCUTANEOUS
  Administered 2021-01-13 – 2021-01-14 (×4): 2 [IU] via SUBCUTANEOUS
  Administered 2021-01-14 (×2): 5 [IU] via SUBCUTANEOUS
  Administered 2021-01-15: 3 [IU] via SUBCUTANEOUS
  Administered 2021-01-15: 5 [IU] via SUBCUTANEOUS
  Administered 2021-01-15 (×2): 3 [IU] via SUBCUTANEOUS
  Administered 2021-01-16: 5 [IU] via SUBCUTANEOUS
  Administered 2021-01-16 (×2): 8 [IU] via SUBCUTANEOUS
  Administered 2021-01-16: 3 [IU] via SUBCUTANEOUS
  Administered 2021-01-17: 8 [IU] via SUBCUTANEOUS
  Administered 2021-01-17 (×2): 3 [IU] via SUBCUTANEOUS
  Administered 2021-01-17: 8 [IU] via SUBCUTANEOUS
  Administered 2021-01-18: 5 [IU] via SUBCUTANEOUS
  Administered 2021-01-18: 3 [IU] via SUBCUTANEOUS
  Administered 2021-01-18: 8 [IU] via SUBCUTANEOUS
  Administered 2021-01-18 – 2021-01-19 (×3): 5 [IU] via SUBCUTANEOUS
  Administered 2021-01-19: 8 [IU] via SUBCUTANEOUS
  Administered 2021-01-19 – 2021-01-20 (×2): 5 [IU] via SUBCUTANEOUS
  Administered 2021-01-20: 11 [IU] via SUBCUTANEOUS
  Administered 2021-01-20: 5 [IU] via SUBCUTANEOUS
  Administered 2021-01-20 – 2021-01-21 (×2): 8 [IU] via SUBCUTANEOUS
  Administered 2021-01-21: 3 [IU] via SUBCUTANEOUS
  Administered 2021-01-21 (×2): 8 [IU] via SUBCUTANEOUS
  Administered 2021-01-22: 2 [IU] via SUBCUTANEOUS
  Administered 2021-01-22: 3 [IU] via SUBCUTANEOUS
  Filled 2021-01-10 (×49): qty 1

## 2021-01-10 MED ORDER — SODIUM CHLORIDE 0.9 % IV SOLN
2.0000 g | INTRAVENOUS | Status: AC
  Administered 2021-01-10 – 2021-01-13 (×4): 2 g via INTRAVENOUS
  Filled 2021-01-10: qty 20
  Filled 2021-01-10: qty 2
  Filled 2021-01-10 (×2): qty 20

## 2021-01-10 MED ORDER — AMIODARONE HCL IN DEXTROSE 360-4.14 MG/200ML-% IV SOLN
60.0000 mg/h | INTRAVENOUS | Status: AC
  Administered 2021-01-10 (×2): 60 mg/h via INTRAVENOUS
  Filled 2021-01-10 (×2): qty 200

## 2021-01-10 MED ORDER — DILTIAZEM HCL 25 MG/5ML IV SOLN
INTRAVENOUS | Status: AC
  Administered 2021-01-10: 15 mg via INTRAVENOUS
  Filled 2021-01-10: qty 5

## 2021-01-10 MED ORDER — SODIUM CHLORIDE 0.9 % IV SOLN
500.0000 mg | INTRAVENOUS | Status: DC
  Administered 2021-01-10 – 2021-01-11 (×2): 500 mg via INTRAVENOUS
  Filled 2021-01-10 (×3): qty 500

## 2021-01-10 MED ORDER — UMECLIDINIUM BROMIDE 62.5 MCG/INH IN AEPB
1.0000 | INHALATION_SPRAY | Freq: Every day | RESPIRATORY_TRACT | Status: DC
  Administered 2021-01-11 – 2021-01-17 (×7): 1 via RESPIRATORY_TRACT
  Filled 2021-01-10 (×2): qty 7

## 2021-01-10 MED ORDER — LORAZEPAM 2 MG/ML IJ SOLN
1.0000 mg | Freq: Once | INTRAMUSCULAR | Status: AC
  Administered 2021-01-10: 1 mg via INTRAVENOUS
  Filled 2021-01-10: qty 1

## 2021-01-10 MED ORDER — SODIUM CHLORIDE 0.9 % IV SOLN
12.5000 mg | Freq: Four times a day (QID) | INTRAVENOUS | Status: DC | PRN
  Administered 2021-01-10 – 2021-01-13 (×2): 12.5 mg via INTRAVENOUS
  Filled 2021-01-10 (×2): qty 0.5

## 2021-01-10 MED ORDER — AMIODARONE HCL IN DEXTROSE 360-4.14 MG/200ML-% IV SOLN
30.0000 mg/h | INTRAVENOUS | Status: DC
  Administered 2021-01-10 – 2021-01-13 (×6): 30 mg/h via INTRAVENOUS
  Filled 2021-01-10 (×5): qty 200

## 2021-01-10 MED ORDER — BISACODYL 10 MG RE SUPP
10.0000 mg | Freq: Once | RECTAL | Status: AC
  Administered 2021-01-11: 10 mg via RECTAL
  Filled 2021-01-10: qty 1

## 2021-01-10 MED ORDER — DILTIAZEM HCL-DEXTROSE 125-5 MG/125ML-% IV SOLN (PREMIX)
5.0000 mg/h | INTRAVENOUS | Status: DC
Start: 2021-01-10 — End: 2021-01-10
  Administered 2021-01-10: 5 mg/h via INTRAVENOUS
  Filled 2021-01-10: qty 125

## 2021-01-10 MED ORDER — DIPHENHYDRAMINE HCL 50 MG/ML IJ SOLN
25.0000 mg | Freq: Four times a day (QID) | INTRAMUSCULAR | Status: DC | PRN
  Administered 2021-01-10: 25 mg via INTRAVENOUS
  Filled 2021-01-10: qty 1

## 2021-01-10 MED ORDER — FLUTICASONE FUROATE-VILANTEROL 100-25 MCG/INH IN AEPB
1.0000 | INHALATION_SPRAY | Freq: Every day | RESPIRATORY_TRACT | Status: DC
  Administered 2021-01-11 – 2021-01-17 (×7): 1 via RESPIRATORY_TRACT
  Filled 2021-01-10 (×2): qty 28

## 2021-01-10 MED ORDER — AMIODARONE LOAD VIA INFUSION
150.0000 mg | Freq: Once | INTRAVENOUS | Status: AC
  Administered 2021-01-10: 150 mg via INTRAVENOUS
  Filled 2021-01-10: qty 83.34

## 2021-01-10 NOTE — Consult Note (Signed)
Raymond Hays is a 75 y.o. male  [id number]  Primary Cardiologist: Adrian Blackwater, MD Reason for Consultation: uncontrolled atrial fibrillation  HPI: Raymond Hays is a 75 y.o. Caucasian male with medical history significant for multiple medical problems that are mentioned below, including COPD, type 2 diabetes mellitus, paroxysmal atrial fibrillation, dyslipidemia, osteoarthritis and hypertension, who presented to the emergency room with acute onset of respiratory distress.  The patient has been having worsening dyspnea over the last several days with associated wheezing, dry cough and chest congestion with inability to expectorate sputum.  He admits to dyspnea on exertion and currently at rest.  His pulse oximetry was in the mid 80s upon EMS arrival and he was in significant respiratory distress he was placed on BiPAP.  He was noted to be in atrial fibrillation with RVR.  No chest pain or palpitations.  He denied any fever or chills.  He had nausea and vomiting once yesterday.  No diarrhea.  No bilious vomitus or hematemesis.  He has been having significant lower extremity edema more on the right than the left.  No dysuria, oliguria, urinary frequency or urgency or flank pain.  No fever or chills.   Review of Systems: Patient denies current chest pain. Shortness of breath unchanged.    Past Medical History:  Diagnosis Date   Allergic rhinitis    Anxiety    Arthritis    Chronic indwelling Foley catheter    COPD (chronic obstructive pulmonary disease) (HCC)    Cough    Diabetes (HCC)    Essential (primary) hypertension    Hemorrhoid    Hyperlipidemia    PAF (paroxysmal atrial fibrillation) (HCC)    Primary osteoarthritis, unspecified shoulder    Retention of urine, unspecified    Spinal stenosis    UTI (urinary tract infection)     (Not in a hospital admission)     apixaban  5 mg Oral BID   atorvastatin  20 mg Oral Daily   carbidopa-levodopa  1 tablet Oral TID AC   escitalopram  5  mg Oral Daily   feeding supplement (GLUCERNA SHAKE)  237 mL Oral BID BM   ferrous sulfate  325 mg Oral TID WC   finasteride  5 mg Oral Daily   fluticasone  1 spray Each Nare Daily   fluticasone furoate-vilanterol  1 puff Inhalation Daily   And   umeclidinium bromide  1 puff Inhalation Daily   furosemide  20 mg Intravenous Q12H   gabapentin  800 mg Oral BID   guaiFENesin  600 mg Oral BID   insulin aspart  0-15 Units Subcutaneous TID AC & HS   ipratropium  2 spray Each Nare BID   lactase  3,000 Units Oral TID WC   methocarbamol  1,000 mg Oral BID   metoprolol succinate  50 mg Oral Daily   Muscle Rub  1 application Topical TID   oxybutynin  10 mg Oral Daily   pantoprazole  40 mg Oral BID AC   polyethylene glycol  17 g Oral Daily   [START ON 01/11/2021] predniSONE  40 mg Oral Q breakfast   psyllium  1 packet Oral Daily   senna-docusate  1 tablet Oral QHS   torsemide  10 mg Oral Daily   traZODone  100 mg Oral QHS    Infusions:  amiodarone 60 mg/hr (01/10/21 1247)   Followed by   amiodarone     azithromycin     cefTRIAXone (ROCEPHIN)  IV  promethazine (PHENERGAN) injection (IM or IVPB) Stopped (01/10/21 1154)    No Known Allergies  Social History   Socioeconomic History   Marital status: Divorced    Spouse name: Not on file   Number of children: Not on file   Years of education: Not on file   Highest education level: Not on file  Occupational History   Occupation: retired    Comment: retired Hotel manager x 4 years; Scientist, physiological; Arboriculturist as Academic librarian  Tobacco Use   Smoking status: Former    Packs/day: 0.50    Years: 10.00    Pack years: 5.00    Types: Cigarettes    Quit date: 2006    Years since quitting: 16.7   Smokeless tobacco: Never  Vaping Use   Vaping Use: Never used  Substance and Sexual Activity   Alcohol use: Not Currently   Drug use: Not Currently    Types: "Crack" cocaine    Comment: quit "crack" 15 years ago--11/04/2019   Sexual activity: Not on  file  Other Topics Concern   Not on file  Social History Narrative   Not on file   Social Determinants of Health   Financial Resource Strain: Not on file  Food Insecurity: Not on file  Transportation Needs: Not on file  Physical Activity: Not on file  Stress: Not on file  Social Connections: Not on file  Intimate Partner Violence: Not on file    Family History  Problem Relation Age of Onset   Healthy Son    Healthy Son     PHYSICAL EXAM: Vitals:   01/10/21 1230 01/10/21 1245  BP: (!) 123/110   Pulse: (!) 147 (!) 146  Resp: 18 18  Temp:    SpO2: 94% 96%     Intake/Output Summary (Last 24 hours) at 01/10/2021 1312 Last data filed at 01/10/2021 1242 Gross per 24 hour  Intake 1810.51 ml  Output 1675 ml  Net 135.51 ml    General:  No acute respiratory distress, on 2L O2 HEENT: normal Neck: supple. no JVD. Carotids 2+ bilat; no bruits. No lymphadenopathy or thryomegaly appreciated. Cor: PMI nondisplaced. irregular rate & rhythm. No rubs, gallops or murmurs. Lungs: diminished breath sounds, expiratory wheezing, rhonchi, rales. Abdomen: soft, nontender, nondistended. No hepatosplenomegaly. No bruits or masses. Good bowel sounds. Extremities: no cyanosis, clubbing, rash, edema Neuro: alert & oriented x 3, cranial nerves grossly intact. moves all 4 extremities w/o difficulty. Affect pleasant.  ECG: atrial fibrillation, HR 134 bpm, incomplete LBBB  Results for orders placed or performed during the hospital encounter of 01/09/21 (from the past 24 hour(s))  Blood gas, venous     Status: Abnormal (Preliminary result)   Collection Time: 01/09/21  3:47 PM  Result Value Ref Range   pH, Ven 7.38 7.250 - 7.430   pCO2, Ven 51 44.0 - 60.0 mmHg   pO2, Ven PENDING 32.0 - 45.0 mmHg   Bicarbonate 30.2 (H) 20.0 - 28.0 mmol/L   Acid-Base Excess 4.0 (H) 0.0 - 2.0 mmol/L   O2 Saturation 55.8 %   Patient temperature 37.0    Collection site VEIN    Sample type VEIN   CBC with  Differential     Status: Abnormal   Collection Time: 01/09/21  3:47 PM  Result Value Ref Range   WBC 8.2 4.0 - 10.5 K/uL   RBC 3.94 (L) 4.22 - 5.81 MIL/uL   Hemoglobin 12.4 (L) 13.0 - 17.0 g/dL   HCT 84.6 (L) 96.2 - 95.2 %  MCV 92.9 80.0 - 100.0 fL   MCH 31.5 26.0 - 34.0 pg   MCHC 33.9 30.0 - 36.0 g/dL   RDW 47.8 (H) 29.5 - 62.1 %   Platelets 235 150 - 400 K/uL   nRBC 0.4 (H) 0.0 - 0.2 %   Neutrophils Relative % 72 %   Neutro Abs 6.0 1.7 - 7.7 K/uL   Lymphocytes Relative 16 %   Lymphs Abs 1.3 0.7 - 4.0 K/uL   Monocytes Relative 9 %   Monocytes Absolute 0.7 0.1 - 1.0 K/uL   Eosinophils Relative 1 %   Eosinophils Absolute 0.1 0.0 - 0.5 K/uL   Basophils Relative 1 %   Basophils Absolute 0.1 0.0 - 0.1 K/uL   Immature Granulocytes 1 %   Abs Immature Granulocytes 0.08 (H) 0.00 - 0.07 K/uL  Comprehensive metabolic panel     Status: Abnormal   Collection Time: 01/09/21  3:47 PM  Result Value Ref Range   Sodium 137 135 - 145 mmol/L   Potassium 3.9 3.5 - 5.1 mmol/L   Chloride 97 (L) 98 - 111 mmol/L   CO2 30 22 - 32 mmol/L   Glucose, Bld 157 (H) 70 - 99 mg/dL   BUN 28 (H) 8 - 23 mg/dL   Creatinine, Ser 3.08 (H) 0.61 - 1.24 mg/dL   Calcium 8.8 (L) 8.9 - 10.3 mg/dL   Total Protein 6.6 6.5 - 8.1 g/dL   Albumin 3.2 (L) 3.5 - 5.0 g/dL   AST 12 (L) 15 - 41 U/L   ALT 8 0 - 44 U/L   Alkaline Phosphatase 67 38 - 126 U/L   Total Bilirubin 1.0 0.3 - 1.2 mg/dL   GFR, Estimated 54 (L) >60 mL/min   Anion gap 10 5 - 15  Brain natriuretic peptide     Status: Abnormal   Collection Time: 01/09/21  3:47 PM  Result Value Ref Range   B Natriuretic Peptide 263.7 (H) 0.0 - 100.0 pg/mL  Troponin I (High Sensitivity)     Status: Abnormal   Collection Time: 01/09/21  3:47 PM  Result Value Ref Range   Troponin I (High Sensitivity) 22 (H) <18 ng/L  Magnesium     Status: None   Collection Time: 01/09/21  3:47 PM  Result Value Ref Range   Magnesium 1.9 1.7 - 2.4 mg/dL  Procalcitonin - Baseline      Status: None   Collection Time: 01/09/21  3:47 PM  Result Value Ref Range   Procalcitonin 0.22 ng/mL  Blood culture (single)     Status: None (Preliminary result)   Collection Time: 01/09/21  3:47 PM   Specimen: BLOOD  Result Value Ref Range   Specimen Description BLOOD LEFT UPPER ARM    Special Requests      BOTTLES DRAWN AEROBIC AND ANAEROBIC Blood Culture adequate volume   Culture      NO GROWTH < 24 HOURS Performed at [facility], [address] [place], [place] [address]    Report Status PENDING   D-dimer, quantitative     Status: Abnormal   Collection Time: 01/09/21  3:47 PM  Result Value Ref Range   D-Dimer, Quant 3.65 (H) 0.00 - 0.50 ug/mL-FEU  Lactic acid, plasma     Status: Abnormal   Collection Time: 01/09/21  3:48 PM  Result Value Ref Range   Lactic Acid, Venous 2.7 (HH) 0.5 - 1.9 mmol/L  Blood culture (single)     Status: None (Preliminary result)   Collection Time:  01/09/21  3:48 PM   Specimen: BLOOD  Result Value Ref Range   Specimen Description BLOOD RIGHT ANTECUBITAL    Special Requests      BOTTLES DRAWN AEROBIC AND ANAEROBIC Blood Culture adequate volume   Culture      NO GROWTH < 24 HOURS Performed at [facility], [address] [place], [place] [address]    Report Status PENDING   Lactic acid, plasma     Status: Abnormal   Collection Time: 01/09/21  5:38 PM  Result Value Ref Range   Lactic Acid, Venous 2.4 (HH) 0.5 - 1.9 mmol/L  Troponin I (High Sensitivity)     Status: Abnormal   Collection Time: 01/09/21  5:38 PM  Result Value Ref Range   Troponin I (High Sensitivity) 18 (H) <18 ng/L  Resp Panel by RT-PCR (Flu A&B, Covid) Nasopharyngeal Swab     Status: None   Collection Time: 01/10/21  2:06 AM   Specimen: Nasopharyngeal Swab; Nasopharyngeal(NP) swabs in vial transport medium  Result Value Ref Range   SARS Coronavirus 2 by RT PCR NEGATIVE NEGATIVE   Influenza A by PCR NEGATIVE NEGATIVE   Influenza B by PCR NEGATIVE  NEGATIVE  CBG monitoring, ED     Status: Abnormal   Collection Time: 01/10/21  2:07 AM  Result Value Ref Range   Glucose-Capillary 188 (H) 70 - 99 mg/dL  Basic metabolic panel     Status: Abnormal   Collection Time: 01/10/21  6:40 AM  Result Value Ref Range   Sodium 137 135 - 145 mmol/L   Potassium 4.4 3.5 - 5.1 mmol/L   Chloride 99 98 - 111 mmol/L   CO2 29 22 - 32 mmol/L   Glucose, Bld 181 (H) 70 - 99 mg/dL   BUN 26 (H) 8 - 23 mg/dL   Creatinine, Ser 6.71 (H) 0.61 - 1.24 mg/dL   Calcium 8.7 (L) 8.9 - 10.3 mg/dL   GFR, Estimated 59 (L) >60 mL/min   Anion gap 9 5 - 15  CBC     Status: Abnormal   Collection Time: 01/10/21  6:40 AM  Result Value Ref Range   WBC 5.6 4.0 - 10.5 K/uL   RBC 3.94 (L) 4.22 - 5.81 MIL/uL   Hemoglobin 12.1 (L) 13.0 - 17.0 g/dL   HCT 24.5 (L) 80.9 - 98.3 %   MCV 93.7 80.0 - 100.0 fL   MCH 30.7 26.0 - 34.0 pg   MCHC 32.8 30.0 - 36.0 g/dL   RDW 38.2 (H) 50.5 - 39.7 %   Platelets 223 150 - 400 K/uL   nRBC 0.4 (H) 0.0 - 0.2 %  CBG monitoring, ED     Status: Abnormal   Collection Time: 01/10/21  8:01 AM  Result Value Ref Range   Glucose-Capillary 158 (H) 70 - 99 mg/dL  CBG monitoring, ED     Status: Abnormal   Collection Time: 01/10/21 12:21 PM  Result Value Ref Range   Glucose-Capillary 182 (H) 70 - 99 mg/dL   DG Abdomen 1 View  Result Date: 01/10/2021 CLINICAL DATA:  NG tube placement EXAM: ABDOMEN - 1 VIEW COMPARISON:  KUB obtained earlier the same day FINDINGS: The enteric catheter tip projects at the level of the GE junction. The side hole projects over the distal esophagus. Gaseous distention of the stomach and bowel in the imaged upper abdomen is not significantly changed. IMPRESSION: The enteric catheter tip projects at the level of the GE junction, and the side hole projects  over the distal esophagus. Recommend advancement by approximately 7 cm. These results will be called to the ordering clinician or representative by the Radiologist Assistant,  and communication documented in the PACS or [employer]. Electronically Signed   By: Lesia Hausen M.D.   On: 01/10/2021 12:23   DG Abd 1 View  Result Date: 01/10/2021 CLINICAL DATA:  Abdominal pain EXAM: ABDOMEN - 1 VIEW COMPARISON:  None. FINDINGS: There is marked gaseous distension of the stomach. There is milder distension of the small bowel and colon. No acute osseous abnormality. Bilateral hip arthroplasties. IMPRESSION: Marked gaseous distension of the stomach and milder distension of the small bowel and colon. Patient may benefit from a nasogastric tube. Electronically Signed   By: Caprice Renshaw M.D.   On: 01/10/2021 10:32   CT Angio Chest PE W and/or Wo Contrast  Result Date: 01/09/2021 CLINICAL DATA:  Wheezing and congestion. Atrial fibrillation. Hypoxia. Hypotension. COPD. EXAM: CT ANGIOGRAPHY CHEST WITH CONTRAST TECHNIQUE: Multidetector CT imaging of the chest was performed using the standard protocol during bolus administration of intravenous contrast. Multiplanar CT image reconstructions and MIPs were obtained to evaluate the vascular anatomy. CONTRAST:  74mL OMNIPAQUE IOHEXOL 350 MG/ML SOLN COMPARISON:  Multiple exams, including CT chest 10/16/2020 FINDINGS: Cardiovascular: Marginal irregularities simulating chronic pulmonary embolus in the left upper lobe pulmonary artery on images 74 through 100 of series 6 correlate with substantial motion artifact and accordingly are likely artifactual in nature. No definite filling defect characteristic for acute pulmonary embolus is identified in the pulmonary arterial tree. Moderate cardiomegaly. Coronary, aortic arch, and branch vessel atherosclerotic vascular disease. Mitral valve clips noted. Mediastinum/Nodes: Prominence of adipose tissues in the upper mediastinum. Suspected fluid in the superior pericardial recess. 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 Dependent  atelectasis in both lower lobes. Mild dependent atelectasis in the lingula. Trace left pleural effusion. Upper Abdomen: Stable hypodense 2.0 by 1.6 cm lesion in the right hepatic lobe on image 71 series 5. Dependent densities in the gallbladder suggesting cholelithiasis. Musculoskeletal: Prominent bilateral rotator cuff muscular atrophy. Substantial bilateral degenerative glenohumeral arthropathy with calcifications in the joint favoring chondrocalcinosis. Substantial bilateral sternoclavicular joint arthropathy. Scattered mild intramedullary sclerosis along some ribs, probably incidental. Old deformity of the left medial eleventh rib from an old healed fracture. Thoracic spondylosis. Lower cervical plate and screw fixator. Prominent multilevel thoracic degenerative disc disease multilevel impingement due to spurring. Review of the MIP images confirms the above findings. IMPRESSION: 1. No filling defect is identified in the pulmonary arterial tree to suggest pulmonary embolus. Reduced sensitivity T2 the degree of motion artifact. 2. 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 3. Mild atelectasis in both lower lobes and in the lingula. 4. Trace left pleural effusion. 5. Cardiomegaly with coronary and aortic atherosclerosis. Mitral valve clips noted. Aortic Atherosclerosis (ICD10-I70.0). 6. Substantial bilateral glenohumeral arthropathy. Prominent bilateral rotator cuff muscular atrophy. 7. Multilevel thoracic spine impingement due to spurring. Electronically Signed   By: Gaylyn Rong M.D.   On: 01/09/2021 18:38   US Venous Img Lower Bilateral (DVT)  Result Date: 01/10/2021 CLINICAL DATA:  Lower extremity edema EXAM: BILATERAL LOWER EXTREMITY VENOUS DOPPLER ULTRASOUND TECHNIQUE: Gray-scale sonography with compression, as well as color and duplex ultrasound, were performed to evaluate the deep venous system(s) from the level of the common femoral vein through the  popliteal and proximal calf veins. COMPARISON:  None. FINDINGS: VENOUS Normal compressibility of the common femoral, superficial femoral, and popliteal veins, as well  as the visualized calf veins. Visualized portions of profunda femoral vein and great saphenous vein unremarkable. No filling defects to suggest DVT on grayscale or color Doppler imaging. Doppler waveforms show normal direction of venous flow, normal respiratory plasticity and response to augmentation. Limited views of the contralateral common femoral vein are unremarkable. OTHER None. Limitations: none IMPRESSION: Negative. Electronically Signed   By: Deatra Robinson M.D.   On: 01/10/2021 02:12   DG Chest Portable 1 View  Result Date: 01/09/2021 CLINICAL DATA:  Shortness of breath, wheezing, congestion EXAM: PORTABLE CHEST 1 VIEW COMPARISON:  Chest radiograph 12/04/2020 FINDINGS: The heart appears mildly enlarged, though this is likely exaggerated by AP technique and low lung volumes. The mediastinal contours are stable. There is calcified atherosclerotic plaque of the aortic arch. Lung volumes are low, with unchanged asymmetric elevation of the right hemidiaphragm. Linear opacities in the lateral left base likely reflect atelectasis or scar. There is no other focal consolidation. There is no pulmonary edema. There is no definite pneumothorax, though the apices are suboptimally evaluated. There is no acute osseous abnormality. IMPRESSION: Low lung volumes. Otherwise, no radiographic evidence of acute cardiopulmonary process. Electronically Signed   By: Lesia Hausen M.D.   On: 01/09/2021 16:24     ASSESSMENT AND PLAN: Patient denies chest pain. Shortness of breath unchanged. Will start patient on IV amiodarone per protocol. Will continue to follow.   Museum/gallery conservator FNP-C

## 2021-01-10 NOTE — ED Notes (Signed)
Hospitalist notified of HR 110-120, pruritic rash on R inner arm, and downgrade from bipap to nasal cannula 2L.

## 2021-01-10 NOTE — ED Notes (Signed)
Spoke with Dr Myriam Forehand on phone, discussed pt vomiting and HR. See new orders. HR now 132. Attempting to take BP, not reading well. Per Dr Myriam Forehand, still give IV zofran now.

## 2021-01-10 NOTE — ED Notes (Signed)
Pt to xray now for abdominal xray.

## 2021-01-10 NOTE — ED Notes (Signed)
Dr Myriam Forehand evaluated pt again at bedside and explained plan of care.

## 2021-01-10 NOTE — ED Notes (Signed)
Changed BP cuff to large size and attempting BP reading on R arm now.

## 2021-01-10 NOTE — ED Notes (Signed)
Pt was taken off bipap bt RT and placed on 2L oxygen per [place]. Pt is satting 98% on 2L. Attending provider notified.

## 2021-01-10 NOTE — Consult Note (Signed)
SURGICAL CONSULTATION NOTE   HISTORY OF PRESENT ILLNESS (HPI):  75 y.o. male presented to [facility] ED for evaluation of respiratory distress. Patient reports not feeling good.  He denies any abdominal pain.  He does endorses having progressive shortness of breath for the last few days.  He was admitted with COPD exacerbation.  Today the patient started having recurrent nausea and vomiting.  NGT was placed.  Despite having NGT in place patient continued having nausea and vomiting episodes.  He has been getting treatment for his COPD exacerbation.  And aminal x-ray taken for NGT placement that shows gastric distention.  CT scan negative for any GI abnormality.  Patient evaluated the images.  Surgery is consulted by Dr. Myriam Forehand in this context for evaluation and management of intractable nausea and vomiting.  PAST MEDICAL HISTORY (PMH):  Past Medical History:  Diagnosis Date   Allergic rhinitis    Anxiety    Arthritis    Chronic indwelling Foley catheter    COPD (chronic obstructive pulmonary disease) (HCC)    Cough    Diabetes (HCC)    Essential (primary) hypertension    Hemorrhoid    Hyperlipidemia    PAF (paroxysmal atrial fibrillation) (HCC)    Primary osteoarthritis, unspecified shoulder    Retention of urine, unspecified    Spinal stenosis    UTI (urinary tract infection)      PAST SURGICAL HISTORY (PSH):  Past Surgical History:  Procedure Laterality Date   APPENDECTOMY     BACK SURGERY     COLONOSCOPY N/A 10/27/2020   Procedure: COLONOSCOPY;  Surgeon: Wyline Mood, MD;  Location: [facility] ENDOSCOPY;  Service: Gastroenterology;  Laterality: N/A;   ELBOW SURGERY     ESOPHAGOGASTRODUODENOSCOPY (EGD) WITH PROPOFOL N/A 10/27/2020   Procedure: ESOPHAGOGASTRODUODENOSCOPY (EGD) WITH PROPOFOL;  Surgeon: Wyline Mood, MD;  Location: [facility] ENDOSCOPY;  Service: Gastroenterology;  Laterality: N/A;   HERNIA REPAIR     KNEE SURGERY     neck     PARTIAL HIP ARTHROPLASTY Bilateral    TEE WITHOUT  CARDIOVERSION N/A 10/19/2020   Procedure: TRANSESOPHAGEAL ECHOCARDIOGRAM (TEE);  Surgeon: Antonieta Iba, MD;  Location: [facility] ORS;  Service: Cardiovascular;  Laterality: N/A;   TONSILLECTOMY       MEDICATIONS:  Prior to Admission medications   Medication Sig Start Date End Date Taking? Authorizing Provider  apixaban (ELIQUIS) 5 MG TABS tablet Take 5 mg by mouth 2 (two) times daily.  02/06/17  Yes [provider]  atorvastatin (LIPITOR) 20 MG tablet Take 20 mg by mouth daily.   Yes [provider]  BIOFREEZE 4 % GEL Apply 1 application topically 3 (three) times daily. 11/17/20  Yes [provider]  carbidopa-levodopa (SINEMET IR) 25-100 MG tablet Take 1 tablet by mouth 3 (three) times daily before meals. At 0630, 1130, 1630   Yes [provider]  Docusate Sodium (STOOL SOFTENER LAXATIVE PO) Take 2 tablets by mouth at bedtime.   Yes [provider]  escitalopram (LEXAPRO) 5 MG tablet Take 5 mg by mouth daily.   Yes [provider]  ferrous sulfate 325 (65 FE) MG tablet Take 1 tablet (325 mg total) by mouth 3 (three) times daily with meals. 11/01/20  Yes [name], MD  finasteride (PROSCAR) 5 MG tablet Take 5 mg by mouth daily.   Yes [provider]  gabapentin (NEURONTIN) 800 MG tablet Take 800 mg by mouth 2 (two) times daily.    Yes [provider]  ipratropium (ATROVENT) 0.03 %  nasal spray Place 2 sprays into both nostrils 2 (two) times daily. 02/18/20  Yes [provider]  ipratropium-albuterol (DUONEB) 0.5-2.5 (3) MG/3ML SOLN Inhale 3 mLs into the lungs daily. And every 6 hours as needed for shortness of breath or wheezing   Yes [provider]  lactase (LACTAID) 3000 units tablet Take 3,000 Units by mouth 3 (three) times daily with meals.   Yes [provider]  methocarbamol (ROBAXIN) 500 MG tablet Take 1,000 mg by mouth in the morning and at bedtime.    Yes [provider]  metoprolol  succinate (TOPROL-XL) 50 MG 24 hr tablet Take 50 mg by mouth daily.    Yes [provider]  oxybutynin (DITROPAN-XL) 10 MG 24 hr tablet Take 1 tablet (10 mg total) by mouth daily. 11/01/20  Yes [name], MD  polyethylene glycol (MIRALAX / GLYCOLAX) 17 g packet Take 17 g by mouth daily.   Yes [provider]  psyllium (METAMUCIL) 58.6 % powder Take 1 packet by mouth daily.   Yes [provider]  simethicone (MYLICON) 125 MG chewable tablet Chew 125 mg by mouth 3 (three) times daily.   Yes [provider]  Skin Protectants, Misc. (MINERIN CREME EX) Apply topically. Apply to feet twice bid   Yes [provider]  torsemide (DEMADEX) 10 MG tablet Take 10 mg by mouth daily.   Yes [provider]  traMADol (ULTRAM) 50 MG tablet Take 100 mg by mouth every 8 (eight) hours as needed for moderate pain. 11/22/20  Yes [provider]  traZODone (DESYREL) 100 MG tablet Take 100 mg by mouth at bedtime.   Yes [provider]  TRELEGY ELLIPTA 100-62.5-25 MCG/INH AEPB Inhale 1 puff into the lungs daily. 02/17/20  Yes [provider]  triamcinolone cream (KENALOG) 0.1 % Apply 1 application topically 2 (two) times daily.   Yes [provider]  acetaminophen (TYLENOL) 325 MG tablet Take 650 mg by mouth every 4 (four) hours as needed.    [provider]  Albuterol Sulfate (PROAIR RESPICLICK) 108 (90 Base) MCG/ACT AEPB Inhale 2 puffs into the lungs every 4 (four) hours as needed (COPD).    [provider]  ANTI-DANDRUFF 1 % SHAM Apply topically. 02/18/20   [provider]  bisacodyl (DULCOLAX) 10 MG suppository Place 10 mg rectally as needed for moderate constipation.    [provider]  calcium carbonate (TUMS - DOSED IN MG ELEMENTAL CALCIUM) 500 MG chewable tablet Chew 2 tablets by mouth every 6 (six) hours as needed for indigestion or heartburn.    [provider]  carboxymethylcellulose  (REFRESH PLUS) 0.5 % SOLN 1 drop as needed. Patient not taking: Reported on 01/09/2021    [provider]  cetirizine (ZYRTEC) 10 MG tablet Take 10 mg by mouth daily.  Patient not taking: Reported on 01/09/2021    [provider]  clobetasol cream (TEMOVATE) 0.05 % Apply topically. Patient not taking: Reported on 01/09/2021 01/12/20   [provider]  cyclobenzaprine (FLEXERIL) 5 MG tablet Take by mouth. Patient not taking: No sig reported 11/06/20   [provider]  diclofenac Sodium (VOLTAREN) 1 % GEL Apply 4 g topically 4 (four) times daily. Patient not taking: Reported on 01/09/2021    [provider]  diphenhydrAMINE-zinc acetate (BENADRYL ITCH STOPPING) cream Apply topically every 4 (four) hours. Patient not taking: Reported on 01/09/2021    [provider]  Emollient (CETAPHIL) cream Apply 1 application topically as needed.  [provider]  Emollient (EUCERIN DAILY PROTECTION/SPF30) LOTN Apply topically as needed. Patient not taking: Reported on 01/09/2021    [provider]  feeding supplement, GLUCERNA SHAKE, (GLUCERNA SHAKE) LIQD Take 237 mLs by mouth 2 (two) times daily between meals. 11/01/20   [name], MD  fluticasone (FLONASE) 50 MCG/ACT nasal spray Place 1 spray into both nostrils daily.    [provider]  heparin flush 10 UNIT/ML SOLN injection Inject into the vein. Patient not taking: No sig reported 11/14/20   [provider]  hydrOXYzine (ATARAX/VISTARIL) 25 MG tablet Take 25 mg by mouth 2 (two) times daily as needed for itching. 02/17/20   [provider]  Lidocaine 3 % CREA Apply topically daily as needed. Patient not taking: Reported on 01/09/2021    [provider]  metFORMIN (GLUCOPHAGE-XR) 500 MG 24 hr tablet Take 500 mg by mouth daily.     [provider]  montelukast (SINGULAIR) 10 MG tablet Take 10 mg by mouth at bedtime. Patient not taking:  Reported on 01/09/2021    [provider]  nystatin (MYCOSTATIN/NYSTOP) powder  10/21/18   [provider]  pantoprazole (PROTONIX) 40 MG tablet Take 1 tablet (40 mg total) by mouth 2 (two) times daily before a meal. For 8 weeks. 11/01/20 01/03/21  [name], MD  predniSONE (DELTASONE) 20 MG tablet Take 20 mg by mouth daily with breakfast. Patient not taking: Reported on 01/09/2021    [provider]  SARNA lotion Apply topically. 02/25/20   [provider]  senna-docusate (SENOKOT-S) 8.6-50 MG tablet Take by mouth. Patient not taking: Reported on 01/09/2021 10/21/18   [provider]  sulfamethoxazole-trimethoprim (BACTRIM DS) 800-160 MG tablet Take 1 tablet by mouth 2 (two) times daily. Patient not taking: No sig reported 01/08/21   [provider]  tamsulosin (FLOMAX) 0.4 MG CAPS capsule Take 0.4 mg by mouth 2 (two) times daily. Patient not taking: Reported on 01/09/2021    [provider]     ALLERGIES:  No Known Allergies   SOCIAL HISTORY:  Social History   Socioeconomic History   Marital status: Divorced    Spouse name: Not on file   Number of children: Not on file   Years of education: Not on file   Highest education level: Not on file  Occupational History   Occupation: retired    Comment: retired Hotel manager x 4 years; Scientist, physiological; Arboriculturist as Academic librarian  Tobacco Use   Smoking status: Former    Packs/day: 0.50    Years: 10.00    Pack years: 5.00    Types: Cigarettes    Quit date: 2006    Years since quitting: 16.7   Smokeless tobacco: Never  Vaping Use   Vaping Use: Never used  Substance and Sexual Activity   Alcohol use: Not Currently   Drug use: Not Currently    Types: "Crack" cocaine    Comment: quit "crack" 15 years ago--11/04/2019   Sexual activity: Not on file  Other Topics Concern   Not on file  Social History Narrative   Not on file   Social Determinants of Health   Financial Resource Strain:  Not on file  Food Insecurity: Not on file  Transportation Needs: Not on file  Physical Activity: Not on file  Stress: Not on file  Social Connections: Not on file  Intimate Partner Violence: Not on file      FAMILY HISTORY:  Family History  Problem Relation Age of  Onset   Healthy Son    Healthy Son      REVIEW OF SYSTEMS:  Constitutional: denies weight loss, fever, chills, or sweats  Eyes: denies any other vision changes, history of eye injury  ENT: denies sore throat, hearing problems  Respiratory: Positive shortness of breath, wheezing  Cardiovascular: denies chest pain, palpitations  Gastrointestinal: Denies abdominal pain, positive nausea and vomiting Genitourinary: denies burning with urination or urinary frequency Musculoskeletal: denies any other joint pains or cramps  Skin: denies any other rashes or skin discolorations  Neurological: denies any other headache, dizziness, weakness  Psychiatric: denies any other depression, anxiety   All other review of systems were negative   VITAL SIGNS:  Temp:  [97.6 F (36.4 C)] 97.6 F (36.4 C) (10/11 1649) Pulse Rate:  [47-177] 114 (10/12 1515) Resp:  [13-28] 25 (10/12 1515) BP: (80-158)/(60-141) 149/125 (10/12 1430) SpO2:  [86 %-100 %] 97 % (10/12 1515)     Height: 6' (182.9 cm) Weight: 105.8 kg BMI (Calculated): 31.62   INTAKE/OUTPUT:  This shift: Total I/O In: 60.2 [I.V.:10.2; IV Piggyback:50] Out: 875 [Emesis/NG output:875]  Last 2 shifts: @IOLAST2SHIFTS @   PHYSICAL EXAM:  Constitutional:  -- Normal body habitus  -- Awake, alert, and oriented x3  Gastrointestinal:  -- Abdomen soft, nontender, mild distended.  No guarding or rebound.   Labs:  CBC Latest Ref Rng & Units 01/10/2021 01/10/2021 01/09/2021  WBC 4.0 - 10.5 K/uL 8.9 5.6 8.2  Hemoglobin 13.0 - 17.0 g/dL 12.7(L) 12.1(L) 12.4(L)  Hematocrit 39.0 - 52.0 % 37.3(L) 36.9(L) 36.6(L)  Platelets 150 - 400 K/uL 273 223 235   CMP Latest Ref Rng & Units  01/10/2021 01/10/2021 01/09/2021  Glucose 70 - 99 mg/dL 623(J) 628(B) 151(V)  BUN 8 - 23 mg/dL 61(Y) 07(P) 71(G)  Creatinine 0.61 - 1.24 mg/dL 6.26(R) 4.85(I) 6.27(O)  Sodium 135 - 145 mmol/L 136 137 137  Potassium 3.5 - 5.1 mmol/L 3.8 4.4 3.9  Chloride 98 - 111 mmol/L 94(L) 99 97(L)  CO2 22 - 32 mmol/L 29 29 30   Calcium 8.9 - 10.3 mg/dL 3.5(K) 0.9(F) 8.1(W)  Total Protein 6.5 - 8.1 g/dL 6.4(L) - 6.6  Total Bilirubin 0.3 - 1.2 mg/dL 0.7 - 1.0  Alkaline Phos 38 - 126 U/L 95 - 67  AST 15 - 41 U/L 18 - 12(L)  ALT 0 - 44 U/L 14 - 8    Imaging studies:  EXAM: CT ABDOMEN AND PELVIS WITHOUT CONTRAST   TECHNIQUE: Multidetector CT imaging of the abdomen and pelvis was performed following the standard protocol without IV contrast.   COMPARISON:  CT abdomen/pelvis 10/23/2020   FINDINGS: Lower chest: There is a small left pleural effusion. There is dependent subsegmental atelectasis in both lung bases. Mitral annular calcifications, aortic valve calcifications, and coronary artery calcifications are seen. There is no pericardial effusion.   Hepatobiliary: A hypodense lesion in the right hepatic lobe near the dome is unchanged, likely a cyst. There are no other focal lesions, within the confines of noncontrast technique. There is cholelithiasis without evidence of acute cholecystitis. There is no biliary ductal dilatation.   Pancreas: Unremarkable.   Spleen: Unremarkable.   Adrenals/Urinary Tract: The adrenals are unremarkable.   Bilateral renal parenchymal atrophy is again seen. A 2.2 cm hypodense lesion adjacent to the posterior left renal cortex is unchanged, likely an exophytic cyst. Additional subcentimeter hypodense lesions in the kidneys are too small to characterize but likely reflect additional small cysts. There is a punctate nonobstructing left lower  pole stone, unchanged. There are no other calculi. There is no hydronephrosis or hydroureter. The bladder  is decompressed by a suprapubic catheter and not well assessed.   Stomach/Bowel: There is an enteric catheter in place with the tip in the stomach. The stomach is otherwise unremarkable. There is no evidence of bowel obstruction. There is a moderate stool burden in the rectum, similar to the prior study. There is no finding to suggest stercoral colitis. There is colonic diverticulosis without evidence of acute diverticulitis.   Vascular/Lymphatic: There is scattered calcified atherosclerotic plaque in the nonaneurysmal abdominal aorta. There is no portal venous gas. There is no abdominal or pelvic lymphadenopathy.   Reproductive: The prostate and seminal vesicles are not well assessed.   Other: There is no ascites or free air.   Musculoskeletal: Bilateral hip arthroplasty hardware is noted. The hardware is grossly within expected limits to the level imaged. There is advanced multilevel degenerative change of the lumbar spine with fusion of the L4 and L5 vertebral bodies, vacuum disc phenomenon at L2-L3, L3-L4, and L5-S1, and flowing anterior osteophytes in the lower thoracic spine. There are postsurgical changes reflecting decompressive laminectomies at L3 through L5. The findings are overall unchanged. There is no acute osseous abnormality or aggressive osseous lesion.   IMPRESSION: 1. No acute findings in the abdomen or pelvis. 2. Cholelithiasis without evidence of acute cholecystitis. 3. Diverticulosis without evidence of acute diverticulitis. 4. Moderate stool burden in the rectum without evidence of stercoral colitis. 5. Unchanged punctate nonobstructing left lower pole renal stone.   Aortic Atherosclerosis (ICD10-I70.0).     Electronically Signed   By: Lesia Hausen M.D.   On: 01/10/2021 16:09  Assessment/Plan:  75 y.o. male with nausea and vomiting, complicated by pertinent comorbidities including acute exacerbation of COPD, type 2 diabetes, atrial fibrillation,  dyslipidemia, hypertension.  Patient with acute nausea and vomiting.  Physical exam without acute abdomen.  CT scan abdominal pelvis shows no gastrointestinal pathology.  Nausea and vomiting most likely medical from the COPD exacerbation.  Other possible causes could have been increased intra-abdominal pressure during BiPAP.  I agree to continue medical management of his COPD exacerbation.  Keep NGT in place until he will feel more comfortable.  While he does not feel nauseous NGT can be removed and give p.o. trial.  Continue keeping electrolyte within normal limits.  Consider enema for large stool burden in the rectum. I will continue to follow.   Gae Gallop, MD

## 2021-01-10 NOTE — ED Notes (Signed)
Surgeon at bedside.  

## 2021-01-10 NOTE — ED Notes (Signed)
Ultrasound at the bedside

## 2021-01-10 NOTE — ED Notes (Signed)
Pt still vomiting. HR reached 180. HR now 171. Diltiazem was recently titrated to 10 mg/hr. Hospitalist paged.

## 2021-01-10 NOTE — ED Notes (Signed)
Pt now dry heaving constantly again. NG canister emptied of bile. HR now 129 but was in 170s while vomiting a few minutes ago. Attending provider informed pt still vomiting and dry heaving.  Humidification added to oxygen delivery system. Turned back to 5L, reading 89%. Will monitor.

## 2021-01-10 NOTE — ED Notes (Signed)
Pharmacy was called and said they were sending the ordered promethazine.

## 2021-01-10 NOTE — ED Notes (Signed)
Spoke with Dr Myriam Forehand. See new orders.

## 2021-01-10 NOTE — ED Notes (Signed)
RT was at bedside evaluating pt. Cardizem was given while hospitalist in room. HR now 110, a fib rhythm on monitor. Pt appears less dyspneic now.

## 2021-01-10 NOTE — ED Notes (Signed)
Pt actively vomiting. HR now in 160s-170s. ED secretary is paging hospitalist.

## 2021-01-10 NOTE — ED Notes (Signed)
Spoke with Dr Myriam Forehand and informed of lactic acid of 3.0.

## 2021-01-10 NOTE — ED Notes (Signed)
Spoke with pt sister on phone and gave update.  Pt now sleeping.   Attempting to get BP reading.

## 2021-01-10 NOTE — ED Notes (Signed)
Spoke with Dr Myriam Forehand on phone. Informed pt has working NG tube with almost in suction container  but pt still actively vomiting and HR currently in 150s and did reach almost 180 while vomiting. See new orders.

## 2021-01-10 NOTE — Progress Notes (Addendum)
Progress Note    Raymond Hays  ZOX:[id number] DOB: 1945-09-01  DOA: 01/09/2021 PCP: [facility]      Brief Narrative:    Medical records reviewed and are as summarized below:  Raymond Hays is a 75 y.o. male with past medical history significant for COPD, type II DM, paroxysmal atrial fibrillation, dyslipidemia, osteoarthritis, hypertension, obesity, who presented to the hospital because of worsening shortness of breath.  His oxygen saturation was in the mid 80s when EMS picked him up.  He was placed on BiPAP and was transported to the emergency room for further evaluation.  He was admitted to the hospital for acute hypoxemic respiratory failure and COPD exacerbation.    Assessment/Plan:   Active Problems:   AF (paroxysmal atrial fibrillation) (HCC)   COPD with acute exacerbation (HCC)   Acute respiratory failure (HCC)    Body mass index is 31.63 kg/m.  (Obesity)    COPD exacerbation: Continue IV steroids, bronchodilators and empiric IV antibiotics  Acute hypoxemic respiratory failure: He is requiring 5 L/min oxygen via nasal cannula.  Taper off oxygen as able.  Probable acute diastolic CHF: Treat with IV Lasix.  2D echo in July 2022 showed normal EF, severely dilated left atrium, grade 2 diastolic dysfunction.  Lactic acidosis: This is probably from hypoxia.  Atrial fibrillation with RVR: Heart rate was elevated into the 170s.  Patient was given IV Cardizem bolus followed by IV Cardizem infusion.  However, heart rate remained significantly elevated.  IV Cardizem infusion was switched to IV amiodarone bolus followed by maintenance amiodarone infusion.  Continue metoprolol XL consulted cardiologist for further evaluation.    Mildly elevated troponins: This is likely from demand ischemia.  Hypertensive urgency: Continue metoprolol  Persistent nausea and vomiting, suspect ileus:Abdominal x-ray done on 01/10/2021 showed marked gaseous distention of the stomach and  moderate distention of the small bowel and colon.  Placement of NG tube produced over 500 cc of bilious fluid.  CT abdomen pelvis was ordered for further evaluation because of dry heaves, recurrent vomiting.  General surgeon has also been consulted to assist with management.  AKI: Hold IV Lasix because of vomiting.  Monitor BMP.  Parkinson's disease: Continue Sinemet  Bilateral lower extremity edema (R>L): Probably due to CHF. No evidence of DVT on venous duplex.  Rash on the face and right armpit: He said the facial rash started about 2 days prior to admission and he noticed a rash in the right armpit in the ED today.  Benadryl as needed for itching.  Other comorbidities include type II DM, BPH, dyslipidemia    CRITICAL CARE Performed by: Raymond Hays   Total critical care time: 45 minutes  Critical care time was exclusive of separately billable procedures and treating other patients.  Critical care was necessary to treat or prevent imminent or life-threatening deterioration.  Critical care was time spent personally by me on the following activities: development of treatment plan with patient and/or surrogate as well as nursing, discussions with consultants, evaluation of patient's response to treatment, examination of patient, obtaining history from patient or surrogate, ordering and performing treatments and interventions, ordering and review of laboratory studies, ordering and review of radiographic studies, pulse oximetry and re-evaluation of patient's condition.   Diet Order             Diet Carb Modified Fluid consistency: Thin; Room service appropriate? Yes  Diet effective now  Consultants: General surgeon Cardiologist  Procedures: None    Medications:    apixaban  5 mg Oral BID   atorvastatin  20 mg Oral Daily   carbidopa-levodopa  1 tablet Oral TID AC   escitalopram  5 mg Oral Daily   feeding supplement (GLUCERNA SHAKE)  237 mL  Oral BID BM   ferrous sulfate  325 mg Oral TID WC   finasteride  5 mg Oral Daily   fluticasone  1 spray Each Nare Daily   fluticasone furoate-vilanterol  1 puff Inhalation Daily   And   umeclidinium bromide  1 puff Inhalation Daily   furosemide  20 mg Intravenous Q12H   gabapentin  800 mg Oral BID   guaiFENesin  600 mg Oral BID   insulin aspart  0-15 Units Subcutaneous TID AC & HS   ipratropium  2 spray Each Nare BID   lactase  3,000 Units Oral TID WC   methocarbamol  1,000 mg Oral BID   metoprolol succinate  50 mg Oral Daily   Muscle Rub  1 application Topical TID   oxybutynin  10 mg Oral Daily   pantoprazole  40 mg Oral BID AC   polyethylene glycol  17 g Oral Daily   [START ON 01/11/2021] predniSONE  40 mg Oral Q breakfast   psyllium  1 packet Oral Daily   senna-docusate  1 tablet Oral QHS   traZODone  100 mg Oral QHS   Continuous Infusions:  amiodarone 60 mg/hr (01/10/21 1611)   Followed by   amiodarone     azithromycin     cefTRIAXone (ROCEPHIN)  IV     promethazine (PHENERGAN) injection (IM or IVPB) Stopped (01/10/21 1154)     Anti-infectives (From admission, onward)    Start     Dose/Rate Route Frequency Ordered Stop   01/10/21 1800  azithromycin (ZITHROMAX) 500 mg in sodium chloride 0.9 % 250 mL IVPB        500 mg 250 mL/hr over 60 Minutes Intravenous Every 24 hours 01/10/21 0024     01/10/21 1700  cefTRIAXone (ROCEPHIN) 1 g in sodium chloride 0.9 % 100 mL IVPB  Status:  Discontinued        1 g 200 mL/hr over 30 Minutes Intravenous Every 24 hours 01/09/21 2003 01/10/21 0025   01/10/21 1700  cefTRIAXone (ROCEPHIN) 2 g in sodium chloride 0.9 % 100 mL IVPB        2 g 200 mL/hr over 30 Minutes Intravenous Every 24 hours 01/10/21 0025 01/14/21 1659   01/09/21 1730  cefTRIAXone (ROCEPHIN) 2 g in sodium chloride 0.9 % 100 mL IVPB        2 g 200 mL/hr over 30 Minutes Intravenous  Once 01/09/21 1720 01/09/21 1804   01/09/21 1730  azithromycin (ZITHROMAX) 500 mg in  sodium chloride 0.9 % 250 mL IVPB        500 mg 250 mL/hr over 60 Minutes Intravenous  Once 01/09/21 1720 01/09/21 2002              Family Communication/Anticipated D/C date and plan/Code Status   DVT prophylaxis:  apixaban (ELIQUIS) tablet 5 mg     Code Status: Full Code  Family Communication: None Disposition Plan:    Status is: Inpatient  Remains inpatient appropriate because:IV treatments appropriate due to intensity of illness or inability to take PO and Inpatient level of care appropriate due to severity of illness  Dispo: The patient is from: Home  Anticipated d/c is to: Home              Patient currently is not medically stable to d/c.   Difficult to place patient No           Subjective:   C/o nausea, vomiting, constipation and shortness of breath.  No chest pain, dizziness or palpitations.  Objective:    Vitals:   01/10/21 1445 01/10/21 1500 01/10/21 1515 01/10/21 1600  BP:      Pulse: (!) 124 (!) 125 (!) 114 (!) 127  Resp: (!) 21 (!) 22 (!) 25 (!) 25  Temp:      TempSrc:      SpO2: 96% 100% 97% 90%  Weight:      Height:       No data found.   Intake/Output Summary (Last 24 hours) at 01/10/2021 1614 Last data filed at 01/10/2021 1609 Gross per 24 hour  Intake 2010.51 ml  Output 2175 ml  Net -164.49 ml   Filed Weights   01/09/21 1554  Weight: 105.8 kg    Exam:  GEN: NAD SKIN: Rash on face, right armpit EYES: EOMI ENT: MMM CV: Irregular rate and rhythm, tachycardic PULM: Decreased air entry bilaterally.  Bilateral expiratory wheezing.  No rales heard. ABD: soft, obese, NT, +BS CNS: AAO x 3, non focal EXT: Significant right lower extremity swelling from the thigh to the foot, left foot swelling, cold to touch        Data Reviewed:   I have personally reviewed following labs and imaging studies:  Labs: Labs show the following:   Basic Metabolic Panel: Recent Labs  Lab 01/09/21 1547  01/10/21 0640 01/10/21 1416  NA 137 137 136  K 3.9 4.4 3.8  CL 97* 99 94*  CO2 30 29 29   GLUCOSE 157* 181* 183*  BUN 28* 26* 29*  CREATININE 1.35* 1.26* 1.31*  CALCIUM 8.8* 8.7* 8.8*  MG 1.9  --   --    GFR Estimated Creatinine Clearance: 60.3 mL/min (A) (by C-G formula based on SCr of 1.31 mg/dL (H)). Liver Function Tests: Recent Labs  Lab 01/09/21 1547 01/10/21 1416  AST 12* 18  ALT 8 14  ALKPHOS 67 95  BILITOT 1.0 0.7  PROT 6.6 6.4*  ALBUMIN 3.2* 3.0*   No results for input(s): LIPASE, AMYLASE in the last 168 hours. No results for input(s): AMMONIA in the last 168 hours. Coagulation profile No results for input(s): INR, PROTIME in the last 168 hours.  CBC: Recent Labs  Lab 01/09/21 1547 01/10/21 0640 01/10/21 1416  WBC 8.2 5.6 8.9  NEUTROABS 6.0  --  8.1*  HGB 12.4* 12.1* 12.7*  HCT 36.6* 36.9* 37.3*  MCV 92.9 93.7 93.0  PLT 235 223 273   Cardiac Enzymes: No results for input(s): CKTOTAL, CKMB, CKMBINDEX, TROPONINI in the last 168 hours. BNP (last 3 results) No results for input(s): PROBNP in the last 8760 hours. CBG: Recent Labs  Lab 01/10/21 0207 01/10/21 0801 01/10/21 1221  GLUCAP 188* 158* 182*   D-Dimer: Recent Labs    01/09/21 1547  DDIMER 3.65*   Hgb A1c: No results for input(s): HGBA1C in the last 72 hours. Lipid Profile: No results for input(s): CHOL, HDL, LDLCALC, TRIG, CHOLHDL, LDLDIRECT in the last 72 hours. Thyroid function studies: No results for input(s): TSH, T4TOTAL, T3FREE, THYROIDAB in the last 72 hours.  Invalid input(s): FREET3 Anemia work up: No results for input(s): VITAMINB12, FOLATE, FERRITIN, TIBC, IRON, RETICCTPCT in the last 72  hours. Sepsis Labs: Recent Labs  Lab 01/09/21 1547 01/09/21 1548 01/09/21 1738 01/10/21 0640 01/10/21 1416 01/10/21 1417  PROCALCITON 0.22  --   --   --   --   --   WBC 8.2  --   --  5.6 8.9  --   LATICACIDVEN  --  2.7* 2.4*  --   --  3.0*    Microbiology Recent Results (from  the past 240 hour(s))  Blood culture (single)     Status: None (Preliminary result)   Collection Time: 01/09/21  3:47 PM   Specimen: BLOOD  Result Value Ref Range Status   Specimen Description BLOOD LEFT UPPER ARM  Final   Special Requests   Final    BOTTLES DRAWN AEROBIC AND ANAEROBIC Blood Culture adequate volume   Culture   Final    NO GROWTH < 24 HOURS Performed at [facility], [address] [place], [place] [address]    Report Status PENDING  Incomplete  Blood culture (single)     Status: None (Preliminary result)   Collection Time: 01/09/21  3:48 PM   Specimen: BLOOD  Result Value Ref Range Status   Specimen Description BLOOD RIGHT ANTECUBITAL  Final   Special Requests   Final    BOTTLES DRAWN AEROBIC AND ANAEROBIC Blood Culture adequate volume   Culture   Final    NO GROWTH < 24 HOURS Performed at [facility], [address], [place], [place] [address]    Report Status PENDING  Incomplete  Resp Panel by RT-PCR (Flu A&B, Covid) Nasopharyngeal Swab     Status: None   Collection Time: 01/10/21  2:06 AM   Specimen: Nasopharyngeal Swab; Nasopharyngeal(NP) swabs in vial transport medium  Result Value Ref Range Status   SARS Coronavirus 2 by RT PCR NEGATIVE NEGATIVE Final    Comment: (NOTE) SARS-CoV-2 target nucleic acids are NOT DETECTED.  The SARS-CoV-2 RNA is generally detectable in upper respiratory specimens during the acute phase of infection. The lowest concentration of SARS-CoV-2 viral copies this assay can detect is 138 copies/mL. A negative result does not preclude SARS-Cov-2 infection and should not be used as the sole basis for treatment or other patient management decisions. A negative result may occur with  improper specimen collection/handling, submission of specimen other than nasopharyngeal swab, presence of viral mutation(s) within the areas targeted by this assay, and inadequate number of viral copies(<138 copies/mL). A negative  result must be combined with clinical observations, patient history, and epidemiological information. The expected result is Negative.  Fact Sheet for Patients:  BloggerCourse.com  Fact Sheet for Healthcare Providers:  SeriousBroker.it  This test is no t yet approved or cleared by the [place] FDA and  has been authorized for detection and/or diagnosis of SARS-CoV-2 by FDA under an Emergency Use Authorization (EUA). This EUA will remain  in effect (meaning this test can be used) for the duration of the COVID-19 declaration under Section 564(b)(1) of the Act, 21 U.S.C.section 360bbb-3(b)(1), unless the authorization is terminated  or revoked sooner.       Influenza A by PCR NEGATIVE NEGATIVE Final   Influenza B by PCR NEGATIVE NEGATIVE Final    Comment: (NOTE) The Xpert Xpress SARS-CoV-2/FLU/RSV plus assay is intended as an aid in the diagnosis of influenza from Nasopharyngeal swab specimens and should not be used as a sole basis for treatment. Nasal washings and aspirates are unacceptable for Xpert Xpress SARS-CoV-2/FLU/RSV testing.  Fact Sheet for Patients: BloggerCourse.com  Fact  Sheet for Healthcare Providers: SeriousBroker.it  This test is not yet approved or cleared by the [place] and has been authorized for detection and/or diagnosis of SARS-CoV-2 by FDA under an Emergency Use Authorization (EUA). This EUA will remain in effect (meaning this test can be used) for the duration of the COVID-19 declaration under Section 564(b)(1) of the Act, 21 U.S.C. section 360bbb-3(b)(1), unless the authorization is terminated or revoked.  Performed at [facility], [address] [place], [place] [address]     Procedures and diagnostic studies:  CT ABDOMEN PELVIS WO CONTRAST  Result Date: 01/10/2021 CLINICAL DATA:  Nausea, vomiting, upper abdominal  pain EXAM: CT ABDOMEN AND PELVIS WITHOUT CONTRAST TECHNIQUE: Multidetector CT imaging of the abdomen and pelvis was performed following the standard protocol without IV contrast. COMPARISON:  CT abdomen/pelvis 10/23/2020 FINDINGS: Lower chest: There is a small left pleural effusion. There is dependent subsegmental atelectasis in both lung bases. Mitral annular calcifications, aortic valve calcifications, and coronary artery calcifications are seen. There is no pericardial effusion. Hepatobiliary: A hypodense lesion in the right hepatic lobe near the dome is unchanged, likely a cyst. There are no other focal lesions, within the confines of noncontrast technique. There is cholelithiasis without evidence of acute cholecystitis. There is no biliary ductal dilatation. Pancreas: Unremarkable. Spleen: Unremarkable. Adrenals/Urinary Tract: The adrenals are unremarkable. Bilateral renal parenchymal atrophy is again seen. A 2.2 cm hypodense lesion adjacent to the posterior left renal cortex is unchanged, likely an exophytic cyst. Additional subcentimeter hypodense lesions in the kidneys are too small to characterize but likely reflect additional small cysts. There is a punctate nonobstructing left lower pole stone, unchanged. There are no other calculi. There is no hydronephrosis or hydroureter. The bladder is decompressed by a suprapubic catheter and not well assessed. Stomach/Bowel: There is an enteric catheter in place with the tip in the stomach. The stomach is otherwise unremarkable. There is no evidence of bowel obstruction. There is a moderate stool burden in the rectum, similar to the prior study. There is no finding to suggest stercoral colitis. There is colonic diverticulosis without evidence of acute diverticulitis. Vascular/Lymphatic: There is scattered calcified atherosclerotic plaque in the nonaneurysmal abdominal aorta. There is no portal venous gas. There is no abdominal or pelvic lymphadenopathy.  Reproductive: The prostate and seminal vesicles are not well assessed. Other: There is no ascites or free air. Musculoskeletal: Bilateral hip arthroplasty hardware is noted. The hardware is grossly within expected limits to the level imaged. There is advanced multilevel degenerative change of the lumbar spine with fusion of the L4 and L5 vertebral bodies, vacuum disc phenomenon at L2-L3, L3-L4, and L5-S1, and flowing anterior osteophytes in the lower thoracic spine. There are postsurgical changes reflecting decompressive laminectomies at L3 through L5. The findings are overall unchanged. There is no acute osseous abnormality or aggressive osseous lesion. IMPRESSION: 1. No acute findings in the abdomen or pelvis. 2. Cholelithiasis without evidence of acute cholecystitis. 3. Diverticulosis without evidence of acute diverticulitis. 4. Moderate stool burden in the rectum without evidence of stercoral colitis. 5. Unchanged punctate nonobstructing left lower pole renal stone. Aortic Atherosclerosis (ICD10-I70.0). Electronically Signed   By: Raymond Hays M.D.   On: 01/10/2021 16:09   DG Abdomen 1 View  Result Date: 01/10/2021 CLINICAL DATA:  NG tube placement EXAM: ABDOMEN - 1 VIEW COMPARISON:  KUB obtained earlier the same day FINDINGS: The enteric catheter tip projects at the level of the GE junction. The side hole projects over the distal  esophagus. Gaseous distention of the stomach and bowel in the imaged upper abdomen is not significantly changed. IMPRESSION: The enteric catheter tip projects at the level of the GE junction, and the side hole projects over the distal esophagus. Recommend advancement by approximately 7 cm. These results will be called to the ordering clinician or representative by the Radiologist Assistant, and communication documented in the PACS or [employer]. Electronically Signed   By: Raymond Hays M.D.   On: 01/10/2021 12:23   DG Abd 1 View  Result Date: 01/10/2021 CLINICAL DATA:   Abdominal pain EXAM: ABDOMEN - 1 VIEW COMPARISON:  None. FINDINGS: There is marked gaseous distension of the stomach. There is milder distension of the small bowel and colon. No acute osseous abnormality. Bilateral hip arthroplasties. IMPRESSION: Marked gaseous distension of the stomach and milder distension of the small bowel and colon. Patient may benefit from a nasogastric tube. Electronically Signed   By: Raymond Hays M.D.   On: 01/10/2021 10:32   CT Angio Chest PE W and/or Wo Contrast  Result Date: 01/09/2021 CLINICAL DATA:  Wheezing and congestion. Atrial fibrillation. Hypoxia. Hypotension. COPD. EXAM: CT ANGIOGRAPHY CHEST WITH CONTRAST TECHNIQUE: Multidetector CT imaging of the chest was performed using the standard protocol during bolus administration of intravenous contrast. Multiplanar CT image reconstructions and MIPs were obtained to evaluate the vascular anatomy. CONTRAST:  69mL OMNIPAQUE IOHEXOL 350 MG/ML SOLN COMPARISON:  Multiple exams, including CT chest 10/16/2020 FINDINGS: Cardiovascular: Marginal irregularities simulating chronic pulmonary embolus in the left upper lobe pulmonary artery on images 74 through 100 of series 6 correlate with substantial motion artifact and accordingly are likely artifactual in nature. No definite filling defect characteristic for acute pulmonary embolus is identified in the pulmonary arterial tree. Moderate cardiomegaly. Coronary, aortic arch, and branch vessel atherosclerotic vascular disease. Mitral valve clips noted. Mediastinum/Nodes: Prominence of adipose tissues in the upper mediastinum. Suspected fluid in the superior pericardial recess. 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 Dependent atelectasis in both lower lobes. Mild dependent atelectasis in the lingula. Trace left pleural effusion. Upper Abdomen: Stable hypodense 2.0 by 1.6 cm lesion in the right hepatic lobe on image 71 series  5. Dependent densities in the gallbladder suggesting cholelithiasis. Musculoskeletal: Prominent bilateral rotator cuff muscular atrophy. Substantial bilateral degenerative glenohumeral arthropathy with calcifications in the joint favoring chondrocalcinosis. Substantial bilateral sternoclavicular joint arthropathy. Scattered mild intramedullary sclerosis along some ribs, probably incidental. Old deformity of the left medial eleventh rib from an old healed fracture. Thoracic spondylosis. Lower cervical plate and screw fixator. Prominent multilevel thoracic degenerative disc disease multilevel impingement due to spurring. Review of the MIP images confirms the above findings. IMPRESSION: 1. No filling defect is identified in the pulmonary arterial tree to suggest pulmonary embolus. Reduced sensitivity T2 the degree of motion artifact. 2. 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 3. Mild atelectasis in both lower lobes and in the lingula. 4. Trace left pleural effusion. 5. Cardiomegaly with coronary and aortic atherosclerosis. Mitral valve clips noted. Aortic Atherosclerosis (ICD10-I70.0). 6. Substantial bilateral glenohumeral arthropathy. Prominent bilateral rotator cuff muscular atrophy. 7. Multilevel thoracic spine impingement due to spurring. Electronically Signed   By: Raymond Hays M.D.   On: 01/09/2021 18:38   US Venous Img Lower Bilateral (DVT)  Result Date: 01/10/2021 CLINICAL DATA:  Lower extremity edema EXAM: BILATERAL LOWER EXTREMITY VENOUS DOPPLER ULTRASOUND TECHNIQUE: Gray-scale sonography with compression, as well as color and duplex ultrasound, were performed to evaluate the deep venous  system(s) from the level of the common femoral vein through the popliteal and proximal calf veins. COMPARISON:  None. FINDINGS: VENOUS Normal compressibility of the common femoral, superficial femoral, and popliteal veins, as well as the visualized calf veins. Visualized  portions of profunda femoral vein and great saphenous vein unremarkable. No filling defects to suggest DVT on grayscale or color Doppler imaging. Doppler waveforms show normal direction of venous flow, normal respiratory plasticity and response to augmentation. Limited views of the contralateral common femoral vein are unremarkable. OTHER None. Limitations: none IMPRESSION: Negative. Electronically Signed   By: Raymond Hays M.D.   On: 01/10/2021 02:12   DG Chest Portable 1 View  Result Date: 01/09/2021 CLINICAL DATA:  Shortness of breath, wheezing, congestion EXAM: PORTABLE CHEST 1 VIEW COMPARISON:  Chest radiograph 12/04/2020 FINDINGS: The heart appears mildly enlarged, though this is likely exaggerated by AP technique and low lung volumes. The mediastinal contours are stable. There is calcified atherosclerotic plaque of the aortic arch. Lung volumes are low, with unchanged asymmetric elevation of the right hemidiaphragm. Linear opacities in the lateral left base likely reflect atelectasis or scar. There is no other focal consolidation. There is no pulmonary edema. There is no definite pneumothorax, though the apices are suboptimally evaluated. There is no acute osseous abnormality. IMPRESSION: Low lung volumes. Otherwise, no radiographic evidence of acute cardiopulmonary process. Electronically Signed   By: Raymond Hays M.D.   On: 01/09/2021 16:24               LOS: 1 day   Lochlin Eppinger  [facility]   Pager on [email or web address]. If 7PM-7AM, please contact night-coverage at [email or web address]     01/10/2021, 4:14 PM

## 2021-01-10 NOTE — ED Notes (Signed)
Pt was taken to CT. Not in room at this time.

## 2021-01-10 NOTE — ED Notes (Signed)
CBG- 182 

## 2021-01-10 NOTE — ED Notes (Signed)
Pt now complaining of abdominal pain in upper abdomen. Pt states LBM was yesterday.

## 2021-01-10 NOTE — ED Notes (Signed)
Tried to call sister Lanora Manis at [phone] to give update. No answer. Provided phone number to pt to call sister on his phone, which he tried. No answer.  Pt appears much more at ease at this time than before. Pt is not vomiting at this time. Pt resting in bed.

## 2021-01-10 NOTE — ED Notes (Signed)
Pt coughing. HR is 165. Dr Myriam Forehand is on way to see pt.   Pt placed on bedpan.

## 2021-01-10 NOTE — ED Notes (Signed)
This nurse was not called by lab re: lactic of 3.0. Seeing now and having ED secretary page attending provider to inform of this result.

## 2021-01-10 NOTE — ED Notes (Signed)
Pt coughing and dry heaving. HR raised to 150-170, ST on monitor. Denies chest pain. Paged Dr Myriam Forehand who is coming to see pt at bedside. SPO2 remains 99% on 2L.

## 2021-01-10 NOTE — ED Notes (Signed)
Pt coughing and vomiting. HR 156.

## 2021-01-10 NOTE — ED Notes (Signed)
Dr Ayiku at bedside 

## 2021-01-10 NOTE — ED Notes (Signed)
Amiodarone infusion and bolus restarted on second pump channel after first channel failed.   Per radiologist, advance NG tube by 7cm.   NG tube adanced to 56cm marking at R nare.

## 2021-01-10 NOTE — ED Notes (Signed)
ED tech will obtain overdue 1700 CBG. Ceftriaxone is still infusing then will give azithromycin. Pt sleeping.

## 2021-01-10 NOTE — ED Notes (Signed)
HR is in 120s again. Was 131 a few minutes ago, currently 126. Messaged hospitalist to inform HR is high again.

## 2021-01-11 ENCOUNTER — Ambulatory Visit: Payer: No Typology Code available for payment source | Admitting: Infectious Diseases

## 2021-01-11 DIAGNOSIS — I480 Paroxysmal atrial fibrillation: Secondary | ICD-10-CM | POA: Diagnosis not present

## 2021-01-11 DIAGNOSIS — J441 Chronic obstructive pulmonary disease with (acute) exacerbation: Principal | ICD-10-CM

## 2021-01-11 DIAGNOSIS — J9601 Acute respiratory failure with hypoxia: Secondary | ICD-10-CM | POA: Diagnosis not present

## 2021-01-11 LAB — LACTIC ACID, PLASMA: Lactic Acid, Venous: 2.2 mmol/L (ref 0.5–1.9)

## 2021-01-11 LAB — CBC WITH DIFFERENTIAL/PLATELET
Abs Immature Granulocytes: 0.05 10*3/uL (ref 0.00–0.07)
Basophils Absolute: 0 10*3/uL (ref 0.0–0.1)
Basophils Relative: 0 %
Eosinophils Absolute: 0 10*3/uL (ref 0.0–0.5)
Eosinophils Relative: 0 %
HCT: 35.5 % — ABNORMAL LOW (ref 39.0–52.0)
Hemoglobin: 11.9 g/dL — ABNORMAL LOW (ref 13.0–17.0)
Immature Granulocytes: 1 %
Lymphocytes Relative: 9 %
Lymphs Abs: 0.7 10*3/uL (ref 0.7–4.0)
MCH: 31.4 pg (ref 26.0–34.0)
MCHC: 33.5 g/dL (ref 30.0–36.0)
MCV: 93.7 fL (ref 80.0–100.0)
Monocytes Absolute: 0.6 10*3/uL (ref 0.1–1.0)
Monocytes Relative: 7 %
Neutro Abs: 7 10*3/uL (ref 1.7–7.7)
Neutrophils Relative %: 83 %
Platelets: 273 10*3/uL (ref 150–400)
RBC: 3.79 MIL/uL — ABNORMAL LOW (ref 4.22–5.81)
RDW: 20.1 % — ABNORMAL HIGH (ref 11.5–15.5)
WBC: 8.4 10*3/uL (ref 4.0–10.5)
nRBC: 0.5 % — ABNORMAL HIGH (ref 0.0–0.2)

## 2021-01-11 LAB — CBG MONITORING, ED
Glucose-Capillary: 169 mg/dL — ABNORMAL HIGH (ref 70–99)
Glucose-Capillary: 173 mg/dL — ABNORMAL HIGH (ref 70–99)
Glucose-Capillary: 177 mg/dL — ABNORMAL HIGH (ref 70–99)

## 2021-01-11 LAB — BASIC METABOLIC PANEL
Anion gap: 11 (ref 5–15)
BUN: 27 mg/dL — ABNORMAL HIGH (ref 8–23)
CO2: 30 mmol/L (ref 22–32)
Calcium: 8.9 mg/dL (ref 8.9–10.3)
Chloride: 100 mmol/L (ref 98–111)
Creatinine, Ser: 1.28 mg/dL — ABNORMAL HIGH (ref 0.61–1.24)
GFR, Estimated: 58 mL/min — ABNORMAL LOW (ref >60)
Glucose, Bld: 172 mg/dL — ABNORMAL HIGH (ref 70–99)
Potassium: 4 mmol/L (ref 3.5–5.1)
Sodium: 141 mmol/L (ref 135–145)

## 2021-01-11 LAB — GLUCOSE, CAPILLARY: Glucose-Capillary: 148 mg/dL — ABNORMAL HIGH (ref 70–99)

## 2021-01-11 LAB — TSH: TSH: 1.627 u[IU]/mL (ref 0.350–4.500)

## 2021-01-11 MED ORDER — METOPROLOL TARTRATE 50 MG PO TABS
100.0000 mg | ORAL_TABLET | Freq: Two times a day (BID) | ORAL | Status: DC
  Administered 2021-01-11 – 2021-01-22 (×23): 100 mg via ORAL
  Filled 2021-01-11 (×23): qty 2

## 2021-01-11 MED ORDER — FUROSEMIDE 10 MG/ML IJ SOLN
20.0000 mg | Freq: Every day | INTRAMUSCULAR | Status: DC
  Administered 2021-01-11 – 2021-01-17 (×7): 20 mg via INTRAVENOUS
  Filled 2021-01-11 (×2): qty 4
  Filled 2021-01-11: qty 2
  Filled 2021-01-11: qty 4
  Filled 2021-01-11: qty 2
  Filled 2021-01-11: qty 4
  Filled 2021-01-11: qty 2

## 2021-01-11 NOTE — Progress Notes (Signed)
Progress Note    Zac Torti  ZOX:[id number] DOB: 1946/03/13  DOA: 01/09/2021 PCP: [facility]      Brief Narrative:    Medical records reviewed and are as summarized below:  Nickey Kloepfer is a 75 y.o. male with past medical history significant for COPD, type II DM, paroxysmal atrial fibrillation, dyslipidemia, osteoarthritis, hypertension, obesity, who presented to the hospital because of worsening shortness of breath.  His oxygen saturation was in the mid 80s when EMS picked him up.  He was placed on BiPAP and was transported to the emergency room for further evaluation.  He was admitted to the hospital for acute hypoxemic respiratory failure and COPD exacerbation.    Assessment/Plan:   Active Problems:   AF (paroxysmal atrial fibrillation) (HCC)   COPD with acute exacerbation (HCC)   Acute respiratory failure (HCC)    Body mass index is 31.63 kg/m.  (Obesity)    COPD exacerbation: Continue IV steroids, bronchodilators and IV antibiotics.  Acute hypoxemic respiratory failure: He is still on 5 L/min oxygen via nasal cannula.  Taper off oxygen as able.  Probable acute diastolic CHF: Restart IV Lasix.  2D echo in July 2022 showed normal EF, severely dilated left atrium, grade 2 diastolic dysfunction.  Lactic acidosis: Improved.  This was probably from hypoxia.  Atrial fibrillation with RVR: Heart rate is still uncontrolled but overall heart rate is better than yesterday.  Continue IV amiodarone infusion and oral metoprolol.  Follow-up with cardiologist.  Mildly elevated troponins: This is likely from demand ischemia.  Hypertension: Continue metoprolol  Persistent nausea and vomiting, ileus: Improved.  NG tube was removed by the surgeon this morning.  Start full liquid diet.  Appreciate input from general surgeon.  AKI: Creatinine stable.  Monitor creatinine closely.  Parkinson's disease: Continue Sinemet  Bilateral lower extremity edema (R>L): Probably  due to CHF. No evidence of DVT on venous duplex.  Rash on the face and right armpit: He said the facial rash started about 2 days prior to admission and he noticed a rash in the right armpit in the ED today.  Benadryl as needed for itching.  Other comorbidities include type II DM, BPH, dyslipidemia     Diet Order             Diet full liquid Room service appropriate? Yes; Fluid consistency: Thin  Diet effective now                      Consultants: General surgeon Cardiologist  Procedures: None    Medications:    apixaban  5 mg Oral BID   atorvastatin  20 mg Oral Daily   carbidopa-levodopa  1 tablet Oral TID AC   escitalopram  5 mg Oral Daily   feeding supplement (GLUCERNA SHAKE)  237 mL Oral BID BM   ferrous sulfate  325 mg Oral TID WC   finasteride  5 mg Oral Daily   fluticasone  1 spray Each Nare Daily   fluticasone furoate-vilanterol  1 puff Inhalation Daily   And   umeclidinium bromide  1 puff Inhalation Daily   gabapentin  800 mg Oral BID   guaiFENesin  600 mg Oral BID   insulin aspart  0-15 Units Subcutaneous TID AC & HS   ipratropium  2 spray Each Nare BID   lactase  3,000 Units Oral TID WC   methocarbamol  1,000 mg Oral BID   metoprolol succinate  50 mg Oral Daily  Muscle Rub  1 application Topical TID   oxybutynin  10 mg Oral Daily   pantoprazole  40 mg Oral BID AC   polyethylene glycol  17 g Oral Daily   predniSONE  40 mg Oral Q breakfast   psyllium  1 packet Oral Daily   senna-docusate  1 tablet Oral QHS   traZODone  100 mg Oral QHS   Continuous Infusions:  amiodarone 30 mg/hr (01/11/21 0302)   azithromycin Stopped (01/10/21 1922)   cefTRIAXone (ROCEPHIN)  IV Stopped (01/10/21 1823)   promethazine (PHENERGAN) injection (IM or IVPB) Stopped (01/10/21 1154)     Anti-infectives (From admission, onward)    Start     Dose/Rate Route Frequency Ordered Stop   01/10/21 1800  azithromycin (ZITHROMAX) 500 mg in sodium chloride 0.9 % 250 mL  IVPB        500 mg 250 mL/hr over 60 Minutes Intravenous Every 24 hours 01/10/21 0024     01/10/21 1700  cefTRIAXone (ROCEPHIN) 1 g in sodium chloride 0.9 % 100 mL IVPB  Status:  Discontinued        1 g 200 mL/hr over 30 Minutes Intravenous Every 24 hours 01/09/21 2003 01/10/21 0025   01/10/21 1700  cefTRIAXone (ROCEPHIN) 2 g in sodium chloride 0.9 % 100 mL IVPB        2 g 200 mL/hr over 30 Minutes Intravenous Every 24 hours 01/10/21 0025 01/14/21 1659   01/09/21 1730  cefTRIAXone (ROCEPHIN) 2 g in sodium chloride 0.9 % 100 mL IVPB        2 g 200 mL/hr over 30 Minutes Intravenous  Once 01/09/21 1720 01/09/21 1804   01/09/21 1730  azithromycin (ZITHROMAX) 500 mg in sodium chloride 0.9 % 250 mL IVPB        500 mg 250 mL/hr over 60 Minutes Intravenous  Once 01/09/21 1720 01/09/21 2002              Family Communication/Anticipated D/C date and plan/Code Status   DVT prophylaxis:  apixaban (ELIQUIS) tablet 5 mg     Code Status: Full Code  Family Communication: None Disposition Plan:    Status is: Inpatient  Remains inpatient appropriate because:IV treatments appropriate due to intensity of illness or inability to take PO and Inpatient level of care appropriate due to severity of illness  Dispo: The patient is from: Home              Anticipated d/c is to: Home              Patient currently is not medically stable to d/c.   Difficult to place patient No           Subjective:   Interval events noted.  No chest pain, abdominal pain, nausea or vomiting.  Objective:    Vitals:   01/11/21 0913 01/11/21 0930 01/11/21 1030 01/11/21 1130  BP: (!) 128/93 (!) 128/103 122/86 (!) 136/94  Pulse: (!) 132 (!) 125 (!) 136 (!) 124  Resp:  15 (!) 23 15  Temp:      TempSrc:      SpO2:  100%    Weight:      Height:       No data found.   Intake/Output Summary (Last 24 hours) at 01/11/2021 1140 Last data filed at 01/11/2021 0624 Gross per 24 hour  Intake 335.8 ml   Output 1875 ml  Net -1539.2 ml   Filed Weights   01/09/21 1554  Weight: 105.8 kg  Exam:  GEN: NAD SKIN: Warm and dry.  Rash on the face and right axilla EYES: No pallor or icterus ENT: MMM CV: Irregular rate and rhythm, tachycardic PULM: Occasional wheezing.  No rales heard. ABD: soft, ND, NT, +BS CNS: AAO x 3, non focal EXT: Significant swelling of right lower extremity and left foot swelling.  (Patient said he has had swelling for several months)       Data Reviewed:   I have personally reviewed following labs and imaging studies:  Labs: Labs show the following:   Basic Metabolic Panel: Recent Labs  Lab 01/09/21 1547 01/10/21 0640 01/10/21 1416 01/11/21 0707  NA 137 137 136 141  K 3.9 4.4 3.8 4.0  CL 97* 99 94* 100  CO2 30 29 29 30   GLUCOSE 157* 181* 183* 172*  BUN 28* 26* 29* 27*  CREATININE 1.35* 1.26* 1.31* 1.28*  CALCIUM 8.8* 8.7* 8.8* 8.9  MG 1.9  --   --   --    GFR Estimated Creatinine Clearance: 61.7 mL/min (A) (by C-G formula based on SCr of 1.28 mg/dL (H)). Liver Function Tests: Recent Labs  Lab 01/09/21 1547 01/10/21 1416  AST 12* 18  ALT 8 14  ALKPHOS 67 95  BILITOT 1.0 0.7  PROT 6.6 6.4*  ALBUMIN 3.2* 3.0*   No results for input(s): LIPASE, AMYLASE in the last 168 hours. No results for input(s): AMMONIA in the last 168 hours. Coagulation profile No results for input(s): INR, PROTIME in the last 168 hours.  CBC: Recent Labs  Lab 01/09/21 1547 01/10/21 0640 01/10/21 1416 01/11/21 0707  WBC 8.2 5.6 8.9 8.4  NEUTROABS 6.0  --  8.1* 7.0  HGB 12.4* 12.1* 12.7* 11.9*  HCT 36.6* 36.9* 37.3* 35.5*  MCV 92.9 93.7 93.0 93.7  PLT 235 223 273 273   Cardiac Enzymes: No results for input(s): CKTOTAL, CKMB, CKMBINDEX, TROPONINI in the last 168 hours. BNP (last 3 results) No results for input(s): PROBNP in the last 8760 hours. CBG: Recent Labs  Lab 01/10/21 1221 01/10/21 1810 01/10/21 2059 01/11/21 0743 01/11/21 1128   GLUCAP 182* 198* 185* 169* 173*   D-Dimer: Recent Labs    01/09/21 1547  DDIMER 3.65*   Hgb A1c: Recent Labs    01/10/21 0640  HGBA1C 6.7*   Lipid Profile: No results for input(s): CHOL, HDL, LDLCALC, TRIG, CHOLHDL, LDLDIRECT in the last 72 hours. Thyroid function studies: Recent Labs    01/11/21 0707  TSH 1.627   Anemia work up: No results for input(s): VITAMINB12, FOLATE, FERRITIN, TIBC, IRON, RETICCTPCT in the last 72 hours. Sepsis Labs: Recent Labs  Lab 01/09/21 1547 01/09/21 1548 01/09/21 1738 01/10/21 0640 01/10/21 1416 01/10/21 1417 01/11/21 0707  PROCALCITON 0.22  --   --   --   --   --   --   WBC 8.2  --   --  5.6 8.9  --  8.4  LATICACIDVEN  --  2.7* 2.4*  --   --  3.0* 2.2*    Microbiology Recent Results (from the past 240 hour(s))  Blood culture (single)     Status: None (Preliminary result)   Collection Time: 01/09/21  3:47 PM   Specimen: BLOOD  Result Value Ref Range Status   Specimen Description BLOOD LEFT UPPER ARM  Final   Special Requests   Final    BOTTLES DRAWN AEROBIC AND ANAEROBIC Blood Culture adequate volume   Culture   Final    NO GROWTH 2 DAYS Performed  at [facility], [address] [place], [place] [address]    Report Status PENDING  Incomplete  Blood culture (single)     Status: None (Preliminary result)   Collection Time: 01/09/21  3:48 PM   Specimen: BLOOD  Result Value Ref Range Status   Specimen Description BLOOD RIGHT ANTECUBITAL  Final   Special Requests   Final    BOTTLES DRAWN AEROBIC AND ANAEROBIC Blood Culture adequate volume   Culture   Final    NO GROWTH 2 DAYS Performed at [facility], [address] [place], [place] [address]    Report Status PENDING  Incomplete  Resp Panel by RT-PCR (Flu A&B, Covid) Nasopharyngeal Swab     Status: None   Collection Time: 01/10/21  2:06 AM   Specimen: Nasopharyngeal Swab; Nasopharyngeal(NP) swabs in vial transport medium  Result Value Ref Range  Status   SARS Coronavirus 2 by RT PCR NEGATIVE NEGATIVE Final    Comment: (NOTE) SARS-CoV-2 target nucleic acids are NOT DETECTED.  The SARS-CoV-2 RNA is generally detectable in upper respiratory specimens during the acute phase of infection. The lowest concentration of SARS-CoV-2 viral copies this assay can detect is 138 copies/mL. A negative result does not preclude SARS-Cov-2 infection and should not be used as the sole basis for treatment or other patient management decisions. A negative result may occur with  improper specimen collection/handling, submission of specimen other than nasopharyngeal swab, presence of viral mutation(s) within the areas targeted by this assay, and inadequate number of viral copies(<138 copies/mL). A negative result must be combined with clinical observations, patient history, and epidemiological information. The expected result is Negative.  Fact Sheet for Patients:  BloggerCourse.com  Fact Sheet for Healthcare Providers:  SeriousBroker.it  This test is no t yet approved or cleared by the [place] FDA and  has been authorized for detection and/or diagnosis of SARS-CoV-2 by FDA under an Emergency Use Authorization (EUA). This EUA will remain  in effect (meaning this test can be used) for the duration of the COVID-19 declaration under Section 564(b)(1) of the Act, 21 U.S.C.section 360bbb-3(b)(1), unless the authorization is terminated  or revoked sooner.       Influenza A by PCR NEGATIVE NEGATIVE Final   Influenza B by PCR NEGATIVE NEGATIVE Final    Comment: (NOTE) The Xpert Xpress SARS-CoV-2/FLU/RSV plus assay is intended as an aid in the diagnosis of influenza from Nasopharyngeal swab specimens and should not be used as a sole basis for treatment. Nasal washings and aspirates are unacceptable for Xpert Xpress SARS-CoV-2/FLU/RSV testing.  Fact Sheet for  Patients: BloggerCourse.com  Fact Sheet for Healthcare Providers: SeriousBroker.it  This test is not yet approved or cleared by the [place] FDA and has been authorized for detection and/or diagnosis of SARS-CoV-2 by FDA under an Emergency Use Authorization (EUA). This EUA will remain in effect (meaning this test can be used) for the duration of the COVID-19 declaration under Section 564(b)(1) of the Act, 21 U.S.C. section 360bbb-3(b)(1), unless the authorization is terminated or revoked.  Performed at [facility], [address] [place], [place] [address]     Procedures and diagnostic studies:  CT ABDOMEN PELVIS WO CONTRAST  Result Date: 01/10/2021 CLINICAL DATA:  Nausea, vomiting, upper abdominal pain EXAM: CT ABDOMEN AND PELVIS WITHOUT CONTRAST TECHNIQUE: Multidetector CT imaging of the abdomen and pelvis was performed following the standard protocol without IV contrast. COMPARISON:  CT abdomen/pelvis 10/23/2020 FINDINGS: Lower chest: There is a small left pleural  effusion. There is dependent subsegmental atelectasis in both lung bases. Mitral annular calcifications, aortic valve calcifications, and coronary artery calcifications are seen. There is no pericardial effusion. Hepatobiliary: A hypodense lesion in the right hepatic lobe near the dome is unchanged, likely a cyst. There are no other focal lesions, within the confines of noncontrast technique. There is cholelithiasis without evidence of acute cholecystitis. There is no biliary ductal dilatation. Pancreas: Unremarkable. Spleen: Unremarkable. Adrenals/Urinary Tract: The adrenals are unremarkable. Bilateral renal parenchymal atrophy is again seen. A 2.2 cm hypodense lesion adjacent to the posterior left renal cortex is unchanged, likely an exophytic cyst. Additional subcentimeter hypodense lesions in the kidneys are too small to characterize but likely reflect  additional small cysts. There is a punctate nonobstructing left lower pole stone, unchanged. There are no other calculi. There is no hydronephrosis or hydroureter. The bladder is decompressed by a suprapubic catheter and not well assessed. Stomach/Bowel: There is an enteric catheter in place with the tip in the stomach. The stomach is otherwise unremarkable. There is no evidence of bowel obstruction. There is a moderate stool burden in the rectum, similar to the prior study. There is no finding to suggest stercoral colitis. There is colonic diverticulosis without evidence of acute diverticulitis. Vascular/Lymphatic: There is scattered calcified atherosclerotic plaque in the nonaneurysmal abdominal aorta. There is no portal venous gas. There is no abdominal or pelvic lymphadenopathy. Reproductive: The prostate and seminal vesicles are not well assessed. Other: There is no ascites or free air. Musculoskeletal: Bilateral hip arthroplasty hardware is noted. The hardware is grossly within expected limits to the level imaged. There is advanced multilevel degenerative change of the lumbar spine with fusion of the L4 and L5 vertebral bodies, vacuum disc phenomenon at L2-L3, L3-L4, and L5-S1, and flowing anterior osteophytes in the lower thoracic spine. There are postsurgical changes reflecting decompressive laminectomies at L3 through L5. The findings are overall unchanged. There is no acute osseous abnormality or aggressive osseous lesion. IMPRESSION: 1. No acute findings in the abdomen or pelvis. 2. Cholelithiasis without evidence of acute cholecystitis. 3. Diverticulosis without evidence of acute diverticulitis. 4. Moderate stool burden in the rectum without evidence of stercoral colitis. 5. Unchanged punctate nonobstructing left lower pole renal stone. Aortic Atherosclerosis (ICD10-I70.0). Electronically Signed   By: Lesia Hausen M.D.   On: 01/10/2021 16:09   DG Abdomen 1 View  Result Date: 01/10/2021 CLINICAL  DATA:  NG tube placement EXAM: ABDOMEN - 1 VIEW COMPARISON:  KUB obtained earlier the same day FINDINGS: The enteric catheter tip projects at the level of the GE junction. The side hole projects over the distal esophagus. Gaseous distention of the stomach and bowel in the imaged upper abdomen is not significantly changed. IMPRESSION: The enteric catheter tip projects at the level of the GE junction, and the side hole projects over the distal esophagus. Recommend advancement by approximately 7 cm. These results will be called to the ordering clinician or representative by the Radiologist Assistant, and communication documented in the PACS or [employer]. Electronically Signed   By: Lesia Hausen M.D.   On: 01/10/2021 12:23   DG Abd 1 View  Result Date: 01/10/2021 CLINICAL DATA:  Abdominal pain EXAM: ABDOMEN - 1 VIEW COMPARISON:  None. FINDINGS: There is marked gaseous distension of the stomach. There is milder distension of the small bowel and colon. No acute osseous abnormality. Bilateral hip arthroplasties. IMPRESSION: Marked gaseous distension of the stomach and milder distension of the small bowel and colon. Patient may benefit  from a nasogastric tube. Electronically Signed   By: Caprice Renshaw M.D.   On: 01/10/2021 10:32   CT Angio Chest PE W and/or Wo Contrast  Result Date: 01/09/2021 CLINICAL DATA:  Wheezing and congestion. Atrial fibrillation. Hypoxia. Hypotension. COPD. EXAM: CT ANGIOGRAPHY CHEST WITH CONTRAST TECHNIQUE: Multidetector CT imaging of the chest was performed using the standard protocol during bolus administration of intravenous contrast. Multiplanar CT image reconstructions and MIPs were obtained to evaluate the vascular anatomy. CONTRAST:  34mL OMNIPAQUE IOHEXOL 350 MG/ML SOLN COMPARISON:  Multiple exams, including CT chest 10/16/2020 FINDINGS: Cardiovascular: Marginal irregularities simulating chronic pulmonary embolus in the left upper lobe pulmonary artery on images 74 through  100 of series 6 correlate with substantial motion artifact and accordingly are likely artifactual in nature. No definite filling defect characteristic for acute pulmonary embolus is identified in the pulmonary arterial tree. Moderate cardiomegaly. Coronary, aortic arch, and branch vessel atherosclerotic vascular disease. Mitral valve clips noted. Mediastinum/Nodes: Prominence of adipose tissues in the upper mediastinum. Suspected fluid in the superior pericardial recess. 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 Dependent atelectasis in both lower lobes. Mild dependent atelectasis in the lingula. Trace left pleural effusion. Upper Abdomen: Stable hypodense 2.0 by 1.6 cm lesion in the right hepatic lobe on image 71 series 5. Dependent densities in the gallbladder suggesting cholelithiasis. Musculoskeletal: Prominent bilateral rotator cuff muscular atrophy. Substantial bilateral degenerative glenohumeral arthropathy with calcifications in the joint favoring chondrocalcinosis. Substantial bilateral sternoclavicular joint arthropathy. Scattered mild intramedullary sclerosis along some ribs, probably incidental. Old deformity of the left medial eleventh rib from an old healed fracture. Thoracic spondylosis. Lower cervical plate and screw fixator. Prominent multilevel thoracic degenerative disc disease multilevel impingement due to spurring. Review of the MIP images confirms the above findings. IMPRESSION: 1. No filling defect is identified in the pulmonary arterial tree to suggest pulmonary embolus. Reduced sensitivity T2 the degree of motion artifact. 2. 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 3. Mild atelectasis in both lower lobes and in the lingula. 4. Trace left pleural effusion. 5. Cardiomegaly with coronary and aortic atherosclerosis. Mitral valve clips noted. Aortic Atherosclerosis (ICD10-I70.0). 6.  Substantial bilateral glenohumeral arthropathy. Prominent bilateral rotator cuff muscular atrophy. 7. Multilevel thoracic spine impingement due to spurring. Electronically Signed   By: Gaylyn Rong M.D.   On: 01/09/2021 18:38   US Venous Img Lower Bilateral (DVT)  Result Date: 01/10/2021 CLINICAL DATA:  Lower extremity edema EXAM: BILATERAL LOWER EXTREMITY VENOUS DOPPLER ULTRASOUND TECHNIQUE: Gray-scale sonography with compression, as well as color and duplex ultrasound, were performed to evaluate the deep venous system(s) from the level of the common femoral vein through the popliteal and proximal calf veins. COMPARISON:  None. FINDINGS: VENOUS Normal compressibility of the common femoral, superficial femoral, and popliteal veins, as well as the visualized calf veins. Visualized portions of profunda femoral vein and great saphenous vein unremarkable. No filling defects to suggest DVT on grayscale or color Doppler imaging. Doppler waveforms show normal direction of venous flow, normal respiratory plasticity and response to augmentation. Limited views of the contralateral common femoral vein are unremarkable. OTHER None. Limitations: none IMPRESSION: Negative. Electronically Signed   By: Deatra Robinson M.D.   On: 01/10/2021 02:12   DG Chest Portable 1 View  Result Date: 01/09/2021 CLINICAL DATA:  Shortness of breath, wheezing, congestion EXAM: PORTABLE CHEST 1 VIEW COMPARISON:  Chest radiograph 12/04/2020 FINDINGS: The heart appears mildly enlarged, though this is likely exaggerated by AP technique and  low lung volumes. The mediastinal contours are stable. There is calcified atherosclerotic plaque of the aortic arch. Lung volumes are low, with unchanged asymmetric elevation of the right hemidiaphragm. Linear opacities in the lateral left base likely reflect atelectasis or scar. There is no other focal consolidation. There is no pulmonary edema. There is no definite pneumothorax, though the apices are  suboptimally evaluated. There is no acute osseous abnormality. IMPRESSION: Low lung volumes. Otherwise, no radiographic evidence of acute cardiopulmonary process. Electronically Signed   By: Lesia Hausen M.D.   On: 01/09/2021 16:24               LOS: 2 days   Shenia Alan  [facility]   Pager on [email or web address]. If 7PM-7AM, please contact night-coverage at [email or web address]     01/11/2021, 11:40 AM

## 2021-01-11 NOTE — ED Notes (Signed)
Patients glasses cleaned

## 2021-01-11 NOTE — Progress Notes (Signed)
SUBJECTIVE: Raymond Hays is a 75 y.o. Caucasian male with medical history significant for multiple medical problems that are mentioned below, including COPD, type 2 diabetes mellitus, paroxysmal atrial fibrillation, dyslipidemia, osteoarthritis and hypertension, who presented to the emergency room with acute onset of respiratory distress.   Patient laying in comfortably in bed. Denies chest pain, dizziness. Shortness of breath improved.    Vitals:   01/11/21 0400 01/11/21 0500 01/11/21 0600 01/11/21 0630  BP: 95/82 110/81 124/90 116/80  Pulse: (!) 126 (!) 109 (!) 131 (!) 121  Resp: 18 19 15 14   Temp:      TempSrc:      SpO2: 100% 100% 97% 94%  Weight:      Height:        Intake/Output Summary (Last 24 hours) at 01/11/2021 0840 Last data filed at 01/11/2021 01/13/2021 Gross per 24 hour  Intake 335.8 ml  Output 1875 ml  Net -1539.2 ml    LABS: Basic Metabolic Panel: Recent Labs    01/09/21 1547 01/10/21 0640 01/10/21 1416 01/11/21 0707  NA 137   < > 136 141  K 3.9   < > 3.8 4.0  CL 97*   < > 94* 100  CO2 30   < > 29 30  GLUCOSE 157*   < > 183* 172*  BUN 28*   < > 29* 27*  CREATININE 1.35*   < > 1.31* 1.28*  CALCIUM 8.8*   < > 8.8* 8.9  MG 1.9  --   --   --    < > = values in this interval not displayed.   Liver Function Tests: Recent Labs    01/09/21 1547 01/10/21 1416  AST 12* 18  ALT 8 14  ALKPHOS 67 95  BILITOT 1.0 0.7  PROT 6.6 6.4*  ALBUMIN 3.2* 3.0*   No results for input(s): LIPASE, AMYLASE in the last 72 hours. CBC: Recent Labs    01/10/21 1416 01/11/21 0707  WBC 8.9 8.4  NEUTROABS 8.1* 7.0  HGB 12.7* 11.9*  HCT 37.3* 35.5*  MCV 93.0 93.7  PLT 273 273   Cardiac Enzymes: No results for input(s): CKTOTAL, CKMB, CKMBINDEX, TROPONINI in the last 72 hours. BNP: Invalid input(s): POCBNP D-Dimer: Recent Labs    01/09/21 1547  DDIMER 3.65*   Hemoglobin A1C: Recent Labs    01/10/21 0640  HGBA1C 6.7*   Fasting Lipid Panel: No results for  input(s): CHOL, HDL, LDLCALC, TRIG, CHOLHDL, LDLDIRECT in the last 72 hours. Thyroid Function Tests: Recent Labs    01/11/21 0707  TSH 1.627   Anemia Panel: No results for input(s): VITAMINB12, FOLATE, FERRITIN, TIBC, IRON, RETICCTPCT in the last 72 hours.   PHYSICAL EXAM General: Well developed, well nourished, in no acute distress HEENT:  Normocephalic and atramatic Neck:  No JVD.  Lungs: Clear bilaterally to auscultation and percussion. Heart: HRRR . Normal S1 and S2 without gallops or murmurs.  Abdomen: Bowel sounds are positive, abdomen soft and non-tender  Msk:  Back normal, normal gait. Normal strength and tone for age. Extremities: No clubbing, cyanosis or edema.   Neuro: Alert and oriented X 3. Psych:  Good affect, responds appropriately  TELEMETRY: atrial fibrillation, HR 114 bpm  ASSESSMENT AND PLAN: Patient feeling better overall. HR remains elevated. Change metoprolol to tartrate 100 mg twice daily. Will continue to observe.  Active Problems:   AF (paroxysmal atrial fibrillation) (HCC)   COPD with acute exacerbation (HCC)   Acute respiratory failure (HCC)  Fallynn Gravett, FNP-C 01/11/2021 8:40 AM

## 2021-01-11 NOTE — Progress Notes (Signed)
Patient ID: Raymond Hays, male   DOB: 1945/06/14, 75 y.o.   MRN: [id number]     SURGICAL PROGRESS NOTE   Hospital Day(s): 2.   Interval History: Patient seen and examined, no acute events or new complaints overnight. Patient reports feeling a little bit better regarding nausea.  He also feel a little bit better regarding his shortness of breath  Vital signs in last 24 hours: [min-max] current  Temp:  [98.4 F (36.9 C)] 98.4 F (36.9 C) (10/12 1833) Pulse Rate:  [105-177] 132 (10/13 0913) Resp:  [14-28] 14 (10/13 0630) BP: (92-149)/(60-125) 128/93 (10/13 0913) SpO2:  [86 %-100 %] 94 % (10/13 0630)     Height: 6' (182.9 cm) Weight: 105.8 kg BMI (Calculated): 31.62   Physical Exam:  Constitutional: alert, cooperative and no distress  Respiratory: breathing non-labored at rest  Cardiovascular: regular rate and sinus rhythm  Gastrointestinal: soft, non-tender, and non-distended  Labs:  CBC Latest Ref Rng & Units 01/11/2021 01/10/2021 01/10/2021  WBC 4.0 - 10.5 K/uL 8.4 8.9 5.6  Hemoglobin 13.0 - 17.0 g/dL 11.9(L) 12.7(L) 12.1(L)  Hematocrit 39.0 - 52.0 % 35.5(L) 37.3(L) 36.9(L)  Platelets 150 - 400 K/uL 273 273 223   CMP Latest Ref Rng & Units 01/11/2021 01/10/2021 01/10/2021  Glucose 70 - 99 mg/dL 751(Z) 001(V) 494(W)  BUN 8 - 23 mg/dL 96(P) 59(F) 63(W)  Creatinine 0.61 - 1.24 mg/dL 4.66(Z) 9.93(T) 7.01(X)  Sodium 135 - 145 mmol/L 141 136 137  Potassium 3.5 - 5.1 mmol/L 4.0 3.8 4.4  Chloride 98 - 111 mmol/L 100 94(L) 99  CO2 22 - 32 mmol/L 30 29 29   Calcium 8.9 - 10.3 mg/dL 8.9 ) 7.9(T)  Total Protein 6.5 - 8.1 g/dL - 6.4(L) -  Total Bilirubin 0.3 - 1.2 mg/dL - 0.7 -  Alkaline Phos 38 - 126 U/L - 95 -  AST 15 - 41 U/L - 18 -  ALT 0 - 44 U/L - 14 -    Imaging studies: No new pertinent imaging studies   Assessment/Plan:  75 y.o. male with nausea and vomiting, complicated by pertinent comorbidities including acute exacerbation of COPD, type 2 diabetes, atrial fibrillation,  dyslipidemia, hypertension.  The nausea and vomiting has improved.  Patient had nausea this morning.  He is complaining of throat pain.  I remove the NGT.  If there is no medical contraindication to start diet I will suggest to start full liquid diet and assess for toleration.  May advance as tolerated.  No surgical diagnosis identified at this time.  73, MD

## 2021-01-11 NOTE — ED Notes (Signed)
Patient pulled up and situated in bed 

## 2021-01-12 ENCOUNTER — Inpatient Hospital Stay: Payer: No Typology Code available for payment source

## 2021-01-12 DIAGNOSIS — J441 Chronic obstructive pulmonary disease with (acute) exacerbation: Secondary | ICD-10-CM | POA: Diagnosis not present

## 2021-01-12 DIAGNOSIS — J9601 Acute respiratory failure with hypoxia: Secondary | ICD-10-CM | POA: Diagnosis not present

## 2021-01-12 DIAGNOSIS — I480 Paroxysmal atrial fibrillation: Secondary | ICD-10-CM | POA: Diagnosis not present

## 2021-01-12 LAB — GLUCOSE, CAPILLARY
Glucose-Capillary: 134 mg/dL — ABNORMAL HIGH (ref 70–99)
Glucose-Capillary: 155 mg/dL — ABNORMAL HIGH (ref 70–99)
Glucose-Capillary: 188 mg/dL — ABNORMAL HIGH (ref 70–99)
Glucose-Capillary: 182 mg/dL — ABNORMAL HIGH (ref 70–99)

## 2021-01-12 LAB — BASIC METABOLIC PANEL
Anion gap: 7 (ref 5–15)
BUN: 25 mg/dL — ABNORMAL HIGH (ref 8–23)
CO2: 30 mmol/L (ref 22–32)
Calcium: 8.4 mg/dL — ABNORMAL LOW (ref 8.9–10.3)
Chloride: 101 mmol/L (ref 98–111)
Creatinine, Ser: 1.16 mg/dL (ref 0.61–1.24)
GFR, Estimated: >60 mL/min (ref >60)
Glucose, Bld: 107 mg/dL — ABNORMAL HIGH (ref 70–99)
Potassium: 3.1 mmol/L — ABNORMAL LOW (ref 3.5–5.1)
Sodium: 138 mmol/L (ref 135–145)

## 2021-01-12 LAB — MRSA NEXT GEN BY PCR, NASAL: MRSA by PCR Next Gen: DETECTED — AB

## 2021-01-12 LAB — MAGNESIUM: Magnesium: 2 mg/dL (ref 1.7–2.4)

## 2021-01-12 MED ORDER — POTASSIUM CHLORIDE CRYS ER 20 MEQ PO TBCR
40.0000 meq | EXTENDED_RELEASE_TABLET | Freq: Every day | ORAL | Status: AC
  Administered 2021-01-12 – 2021-01-14 (×3): 40 meq via ORAL
  Filled 2021-01-12: qty 2
  Filled 2021-01-12: qty 4
  Filled 2021-01-12: qty 2

## 2021-01-12 MED ORDER — ALBUTEROL SULFATE (2.5 MG/3ML) 0.083% IN NEBU: 2.5000 mg | INHALATION_SOLUTION | RESPIRATORY_TRACT | Status: DC | PRN

## 2021-01-12 MED ORDER — ACETYLCYSTEINE 20 % IN SOLN
4.0000 mL | Freq: Two times a day (BID) | RESPIRATORY_TRACT | Status: DC
  Administered 2021-01-12 (×2): 4 mL via ORAL
  Filled 2021-01-12 (×7): qty 4

## 2021-01-12 MED ORDER — IPRATROPIUM-ALBUTEROL 0.5-2.5 (3) MG/3ML IN SOLN
3.0000 mL | RESPIRATORY_TRACT | Status: DC
  Administered 2021-01-12 – 2021-01-15 (×8): 3 mL via RESPIRATORY_TRACT
  Filled 2021-01-12 (×9): qty 3

## 2021-01-12 MED ORDER — AZITHROMYCIN 250 MG PO TABS
500.0000 mg | ORAL_TABLET | Freq: Every day | ORAL | Status: AC
  Administered 2021-01-12 – 2021-01-13 (×2): 500 mg via ORAL
  Filled 2021-01-12 (×2): qty 2

## 2021-01-12 NOTE — TOC Initial Note (Addendum)
Transition of Care [facility]) - Initial/Assessment Note    Patient Details  Name: Raymond Hays MRN: [id number] Date of Birth: [birth year]  [facility] CM/SW Contact:    Liliana Cline, LCSW Phone Number: 01/12/2021, 1:09 PM  Clinical Narrative:         Spoke to patient who confirms he is a long term care resident at [facility]. Plan to return there at discharge. Patient states he uses wheelchair and oxygen as needed at baseline.           Expected Discharge Plan: Skilled Nursing Facility Barriers to Discharge: Continued Medical Work up   Patient Goals and CMS Choice Patient states their goals for this hospitalization and ongoing recovery are:: return to [email or web address] Compare Post Acute Care list provided to:: Patient Choice offered to / list presented to : Patient  Expected Discharge Plan and Services Expected Discharge Plan: Skilled Nursing Facility                                              Prior Living Arrangements/Services   Lives with:: Facility Resident Patient language and need for interpreter reviewed:: Yes Do you feel safe going back to the place where you live?: Yes      Need for Family Participation in Patient Care: Yes (Comment) Care giver support system in place?: Yes (comment) Current home services: DME Criminal Activity/Legal Involvement Pertinent to Current Situation/Hospitalization: No - Comment as needed  Activities of Daily Living Home Assistive Devices/Equipment: Other (Comment) (from [facility]) ADL Screening (condition at time of admission) Patient's cognitive ability adequate to safely complete daily activities?: Yes Is the patient deaf or have difficulty hearing?: No Does the patient have difficulty seeing, even when wearing glasses/contacts?: No Does the patient have difficulty concentrating, remembering, or making decisions?: No Patient able to express need for assistance with ADLs?: Yes Does the  patient have difficulty dressing or bathing?: Yes Independently performs ADLs?: No Communication: Independent Dressing (OT): Needs assistance Is this a change from baseline?: Pre-admission baseline Grooming: Needs assistance Is this a change from baseline?: Pre-admission baseline Feeding: Needs assistance Is this a change from baseline?: Pre-admission baseline Bathing: Dependent Is this a change from baseline?: Pre-admission baseline Toileting: Dependent Is this a change from baseline?: Pre-admission baseline In/Out Bed: Needs assistance Is this a change from baseline?: Pre-admission baseline Walks in Home: Dependent Is this a change from baseline?: Pre-admission baseline Does the patient have difficulty walking or climbing stairs?: Yes Weakness of Legs: Both Weakness of Arms/Hands: Both  Permission Sought/Granted Permission sought to share information with : Oceanographer granted to share information with : Yes, Verbal Permission Granted     Permission granted to share info w AGENCY: [facility]        Emotional Assessment       Orientation: : Oriented to Self, Oriented to Place, Oriented to  Time, Oriented to Situation Alcohol / Substance Use: Not Applicable Psych Involvement: No (comment)  Admission diagnosis:  Acute respiratory failure (HCC) [J96.00] Edema [R60.9] COPD exacerbation (HCC) [J44.1] Atrial fibrillation with rapid ventricular response (HCC) [I48.91] Acute respiratory failure with hypoxia (HCC) [J96.01] Abdominal pain [R10.9] Patient Active Problem List   Diagnosis Date Noted   Acute respiratory failure (HCC) 01/09/2021   Ileus (HCC)    Neck pain    AKI (  acute kidney injury) (HCC)    Type 2 diabetes mellitus with diabetic neuropathy, without long-term current use of insulin (HCC)    Parkinson disease (HCC)    Sepsis (HCC) 10/16/2020   Chronic respiratory failure with hypoxia (HCC) 01/10/2020   Impaired functional  mobility, balance, gait, and endurance 09/15/2019   Catheter cystitis (HCC) 10/16/2018   COVID-19 virus detected 07/03/2018   Benign prostatic hyperplasia with lower urinary tract symptoms 08/06/2017   Osteoarthritis of right knee 06/19/2017   S/P orthopedic surgery, follow-up exam 10/25/2016   Tendon rupture of wrist, sequela 01/02/2016   Acute pain of right knee 08/03/2015   Calcific tendinitis of left shoulder 07/05/2015   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/05/2015   Gait difficulty 06/26/2015   Severe mitral regurgitation 06/05/2015   AF (paroxysmal atrial fibrillation) (HCC) 05/31/2015   Chronic pain 05/31/2015   COPD with acute exacerbation (HCC) 05/31/2015   Essential hypertension 05/31/2015   Chronic pain of left wrist 04/24/2015   Arthritis of left wrist 04/24/2015   Rupture of extensor tendon of left hand 04/24/2015   PCP:  [facility]:   [facility] - [place], [place] - [address] [place] [address] Phone: [phone] Fax: [phone]     Social Determinants of Health (SDOH) Interventions    Readmission Risk Interventions Readmission Risk Prevention Plan 01/12/2021 10/20/2020  Transportation Screening Complete Complete  PCP or Specialist Appt within 3-5 Days - Complete  HRI or Home Care Consult - Complete  Palliative Care Screening - Not Applicable  Medication Review (RN Care Manager) Complete Referral to Pharmacy  PCP or Specialist appointment within 3-5 days of discharge Complete -  HRI or Home Care Consult Complete -  SW Recovery Care/Counseling Consult Complete -  Palliative Care Screening Not Applicable -  Skilled Nursing Facility Complete -

## 2021-01-12 NOTE — Progress Notes (Addendum)
SUBJECTIVE: Raymond Hays is a 75 y.o. Caucasian male with medical history significant for multiple medical problems that are mentioned below, including COPD, type 2 diabetes mellitus, paroxysmal atrial fibrillation, dyslipidemia, osteoarthritis and hypertension, who presented to the emergency room with acute onset of respiratory distress.   Patient laying in bed. Denies chest pain. Shortness of breath unchanged. Patient complaining of ongoing cough, started prior to hospitalization.   Vitals:   01/11/21 2025 01/11/21 2318 01/12/21 0527 01/12/21 0754  BP: 103/80 (!) 97/58 99/68 93/70   Pulse: 79 91 65 82  Resp: 18 16 16 18   Temp: 98.6 F (37 C) 98.1 F (36.7 C) 98.5 F (36.9 C) 97.8 F (36.6 C)  TempSrc:  Oral Oral   SpO2: 97% 100% 100% 100%  Weight:   105.2 kg   Height:        Intake/Output Summary (Last 24 hours) at 01/12/2021 0831 Last data filed at 01/11/2021 1627 Gross per 24 hour  Intake --  Output 900 ml  Net -900 ml    LABS: Basic Metabolic Panel: Recent Labs    01/09/21 1547 01/10/21 0640 01/11/21 0707 01/12/21 0440  NA 137   < > 141 138  K 3.9   < > 4.0 3.1*  CL 97*   < > 100 101  CO2 30   < > 30 30  GLUCOSE 157*   < > 172* 107*  BUN 28*   < > 27* 25*  CREATININE 1.35*   < > 1.28* 1.16  CALCIUM 8.8*   < > 8.9 8.4*  MG 1.9  --   --  2.0   < > = values in this interval not displayed.   Liver Function Tests: Recent Labs    01/09/21 1547 01/10/21 1416  AST 12* 18  ALT 8 14  ALKPHOS 67 95  BILITOT 1.0 0.7  PROT 6.6 6.4*  ALBUMIN 3.2* 3.0*   No results for input(s): LIPASE, AMYLASE in the last 72 hours. CBC: Recent Labs    01/10/21 1416 01/11/21 0707  WBC 8.9 8.4  NEUTROABS 8.1* 7.0  HGB 12.7* 11.9*  HCT 37.3* 35.5*  MCV 93.0 93.7  PLT 273 273   Cardiac Enzymes: No results for input(s): CKTOTAL, CKMB, CKMBINDEX, TROPONINI in the last 72 hours. BNP: Invalid input(s): POCBNP D-Dimer: Recent Labs    01/09/21 1547  DDIMER 3.65*    Hemoglobin A1C: Recent Labs    01/10/21 0640  HGBA1C 6.7*   Fasting Lipid Panel: No results for input(s): CHOL, HDL, LDLCALC, TRIG, CHOLHDL, LDLDIRECT in the last 72 hours. Thyroid Function Tests: Recent Labs    01/11/21 0707  TSH 1.627   Anemia Panel: No results for input(s): VITAMINB12, FOLATE, FERRITIN, TIBC, IRON, RETICCTPCT in the last 72 hours.   PHYSICAL EXAM General: Well developed, well nourished, in no acute distress HEENT:  Normocephalic and atramatic Neck:  No JVD.  Lungs: Clear bilaterally to auscultation and percussion. Heart: HRRR . Normal S1 and S2 without gallops or murmurs.  Abdomen: Bowel sounds are positive, abdomen soft and non-tender  Msk:  Back normal, normal gait. Normal strength and tone for age. Extremities: No clubbing, cyanosis or edema.   Neuro: Alert and oriented X 3. Psych:  Good affect, responds appropriately  TELEMETRY: atrial fibrillation, HR 86 bpm  ASSESSMENT AND PLAN: Patient heart rate improved, still in atrial fibrillation. Continue amiodarone and metoprolol at current dose. Will continue to observe. Patient heart rate stable. May be be discharged from a cardiac standpoint. Follow up  in office Tuesday 01/16/21 at 9:30 am.  Active Problems:   AF (paroxysmal atrial fibrillation) (HCC)   COPD with acute exacerbation (HCC)   Acute respiratory failure (HCC)    Hazeline Charnley, FNP-C 01/12/2021 8:31 AM

## 2021-01-12 NOTE — Progress Notes (Signed)
Patient ID: Raymond Hays, male   DOB: Nov 25, 1945, 75 y.o.   MRN: [id number]     SURGICAL PROGRESS NOTE   Hospital Day(s): 3.   Interval History: Patient seen and examined, no acute events or new complaints overnight. Patient reports no nausea or vomiting.  No abdominal pain.  Complaining about coughing.  Vital signs in last 24 hours: [min-max] current  Temp:  [97.8 F (36.6 C)-98.8 F (37.1 C)] 97.8 F (36.6 C) (10/14 0754) Pulse Rate:  [65-145] 83 (10/14 0855) Resp:  [10-23] 20 (10/14 0910) BP: (93-151)/(58-102) 93/70 (10/14 0754) SpO2:  [90 %-100 %] 99 % (10/14 0855) Weight:  [105.2 kg] 105.2 kg (10/14 0527)     Height: 6' (182.9 cm) Weight: 105.2 kg BMI (Calculated): 31.44   Physical Exam:  Constitutional: alert, cooperative and no distress  Respiratory: breathing non-labored at rest  Cardiovascular: regular rate and sinus rhythm  Gastrointestinal: soft, non-tender, and non-distended  Labs:  CBC Latest Ref Rng & Units 01/11/2021 01/10/2021 01/10/2021  WBC 4.0 - 10.5 K/uL 8.4 8.9 5.6  Hemoglobin 13.0 - 17.0 g/dL 11.9(L) 12.7(L) 12.1(L)  Hematocrit 39.0 - 52.0 % 35.5(L) 37.3(L) 36.9(L)  Platelets 150 - 400 K/uL 273 273 223   CMP Latest Ref Rng & Units 01/12/2021 01/11/2021 01/10/2021  Glucose 70 - 99 mg/dL 294(T) 654(Y) 503(T)  BUN 8 - 23 mg/dL 46(F) 68(L) 27(N)  Creatinine 0.61 - 1.24 mg/dL 1.70 0.17(C) 9.44(H)  Sodium 135 - 145 mmol/L 138 141 136  Potassium 3.5 - 5.1 mmol/L 3.1(L) 4.0 3.8  Chloride 98 - 111 mmol/L 101 100 94(L)  CO2 22 - 32 mmol/L 30 30 29   Calcium 8.9 - 10.3 mg/dL ) 8.9 6.7(R)  Total Protein 6.5 - 8.1 g/dL - - 6.4(L)  Total Bilirubin 0.3 - 1.2 mg/dL - - 0.7  Alkaline Phos 38 - 126 U/L - - 95  AST 15 - 41 U/L - - 18  ALT 0 - 44 U/L - - 14    Imaging studies: No new pertinent imaging studies   Assessment/Plan:  75 y.o. male with nausea and vomiting, complicated by pertinent comorbidities including acute exacerbation of COPD, type 2 diabetes,  atrial fibrillation, dyslipidemia, hypertension.  Patient tolerated liquid diet yesterday.  He was advanced to solid diet.  I agree with advancing diet.  At this point patient without any nausea or vomiting.  No surgical diagnosis or any indication for any procedures or further work-up.  Contact surgery if there is any change in his clinical status.  Continue medical management as per primary team.  73, MD

## 2021-01-12 NOTE — [place] FL2 (Signed)
[place] MEDICAID FL2 LEVEL OF CARE SCREENING TOOL     IDENTIFICATION  Patient Name: Raymond Hays Birthdate: Jul 08, 1945 Sex: male Admission Date (Current Location): 01/09/2021  [facility] and [place] Number:  Chiropodist and Address:  [facility], [address] [place], [place] [address]      Provider Number: [id number]  Attending Physician Name and Address:  Lurene Shadow, MD  Relative Name and Phone Number:  Osborn Coho (Sister)   [phone] [facility])    Current Level of Care: Hospital Recommended Level of Care: Skilled Nursing Facility Prior Approval Number:    Date Approved/Denied:   PASRR Number:    Discharge Plan:      Current Diagnoses: Patient Active Problem List   Diagnosis Date Noted   Acute respiratory failure (HCC) 01/09/2021   Ileus (HCC)    Neck pain    AKI (acute kidney injury) (HCC)    Type 2 diabetes mellitus with diabetic neuropathy, without long-term current use of insulin (HCC)    Parkinson disease (HCC)    Sepsis (HCC) 10/16/2020   Chronic respiratory failure with hypoxia (HCC) 01/10/2020   Impaired functional mobility, balance, gait, and endurance 09/15/2019   Catheter cystitis (HCC) 10/16/2018   COVID-19 virus detected 07/03/2018   Benign prostatic hyperplasia with lower urinary tract symptoms 08/06/2017   Osteoarthritis of right knee 06/19/2017   S/P orthopedic surgery, follow-up exam 10/25/2016   Tendon rupture of wrist, sequela 01/02/2016   Acute pain of right knee 08/03/2015   Calcific tendinitis of left shoulder 07/05/2015   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/05/2015   Gait difficulty 06/26/2015   Severe mitral regurgitation 06/05/2015   AF (paroxysmal atrial fibrillation) (HCC) 05/31/2015   Chronic pain 05/31/2015   COPD with acute exacerbation (HCC) 05/31/2015   Essential hypertension 05/31/2015   Chronic pain of left wrist 04/24/2015   Arthritis of left wrist 04/24/2015   Rupture of  extensor tendon of left hand 04/24/2015    Orientation RESPIRATION BLADDER Height & Weight     Self, Situation, Place, Time  O2 Incontinent, Indwelling catheter Weight: 231 lb 14.4 oz (105.2 kg) Height:  6' (182.9 cm)  BEHAVIORAL SYMPTOMS/MOOD NEUROLOGICAL BOWEL NUTRITION STATUS        Diet (carb modified diet)  AMBULATORY STATUS COMMUNICATION OF NEEDS Skin   Extensive Assist Verbally Other (Comment) (excoriated, dry)                       Personal Care Assistance Level of Assistance  Bathing, Feeding, Dressing Bathing Assistance: Maximum assistance Feeding assistance: Limited assistance Dressing Assistance: Maximum assistance     Functional Limitations Info             SPECIAL CARE FACTORS FREQUENCY  PT (By licensed PT), OT (By licensed OT)     PT Frequency: 5 times per week OT Frequency: 5 times per week            Contractures      Additional Factors Info  Code Status, Allergies Code Status Info: full Allergies Info: nka           Current Medications (01/12/2021):  This is the current hospital active medication list Current Facility-Administered Medications  Medication Dose Route Frequency Provider Last Rate Last Admin   acetaminophen (TYLENOL) tablet 650 mg  650 mg Oral Q6H PRN Lurene Shadow, MD       Or   acetaminophen (TYLENOL) suppository 650 mg  650 mg Rectal Q6H PRN Lurene Shadow,  MD       acetylcysteine (MUCOMYST) 20 % nebulizer / oral solution 4 mL  4 mL Oral BID Lurene Shadow, MD   4 mL at 01/12/21 1127   albuterol (PROVENTIL) (2.5 MG/3ML) 0.083% nebulizer solution 2.5 mg  2.5 mg Nebulization Q4H PRN Lurene Shadow, MD       amiodarone (NEXTERONE PREMIX) 360-4.14 MG/200ML-% (1.8 mg/mL) IV infusion  30 mg/hr Intravenous Continuous Lurene Shadow, MD 16.67 mL/hr at 01/12/21 1602 30 mg/hr at 01/12/21 1602   apixaban (ELIQUIS) tablet 5 mg  5 mg Oral BID Lurene Shadow, MD   5 mg at 01/12/21 6203   atorvastatin (LIPITOR) tablet 20 mg  20 mg  Oral Daily Lurene Shadow, MD   20 mg at 01/12/21 0826   azithromycin (ZITHROMAX) tablet 500 mg  500 mg Oral Daily Ronnald Ramp, RPH   500 mg at 01/12/21 1653   calcium carbonate (TUMS - dosed in mg elemental calcium) chewable tablet 400 mg of elemental calcium  2 tablet Oral Q6H PRN Lurene Shadow, MD       carbidopa-levodopa (SINEMET IR) 25-100 MG per tablet immediate release 1 tablet  1 tablet Oral TID Lorenza Chick, MD   1 tablet at 01/12/21 1554   cefTRIAXone (ROCEPHIN) 2 g in sodium chloride 0.9 % 100 mL IVPB  2 g Intravenous Q24H Lurene Shadow, MD 200 mL/hr at 01/12/21 1600 2 g at 01/12/21 1600   chlorpheniramine-HYDROcodone (TUSSIONEX) 10-8 MG/5ML suspension 5 mL  5 mL Oral Q12H PRN Lurene Shadow, MD   5 mL at 01/12/21 0813   diphenhydrAMINE (BENADRYL) injection 25 mg  25 mg Intravenous Q6H PRN Lurene Shadow, MD   25 mg at 01/10/21 0932   escitalopram (LEXAPRO) tablet 5 mg  5 mg Oral Daily Lurene Shadow, MD   5 mg at 01/12/21 0830   feeding supplement (GLUCERNA SHAKE) (GLUCERNA SHAKE) liquid 237 mL  237 mL Oral BID BM Lurene Shadow, MD   237 mL at 01/12/21 1554   ferrous sulfate tablet 325 mg  325 mg Oral TID WC Lurene Shadow, MD   325 mg at 01/12/21 1554   finasteride (PROSCAR) tablet 5 mg  5 mg Oral Daily Lurene Shadow, MD   5 mg at 01/12/21 0826   fluticasone (FLONASE) 50 MCG/ACT nasal spray 1 spray  1 spray Each Nare Daily Lurene Shadow, MD   1 spray at 01/12/21 0828   fluticasone furoate-vilanterol (BREO ELLIPTA) 100-25 MCG/INH 1 puff  1 puff Inhalation Daily Lurene Shadow, MD   1 puff at 01/12/21 0838   And   umeclidinium bromide (INCRUSE ELLIPTA) 62.5 MCG/INH 1 puff  1 puff Inhalation Daily Lurene Shadow, MD   1 puff at 01/12/21 0827   furosemide (LASIX) injection 20 mg  20 mg Intravenous Daily Lurene Shadow, MD   20 mg at 01/12/21 5597   gabapentin (NEURONTIN) capsule 800 mg  800 mg Oral BID Lurene Shadow, MD   800 mg at 01/12/21 0826   guaiFENesin (MUCINEX) 12 hr  tablet 600 mg  600 mg Oral BID Lurene Shadow, MD   600 mg at 01/12/21 0827   hydrOXYzine (ATARAX/VISTARIL) tablet 25 mg  25 mg Oral BID PRN Lurene Shadow, MD       insulin aspart (novoLOG) injection 0-15 Units  0-15 Units Subcutaneous TID AC & HS Lurene Shadow, MD   3 Units at 01/12/21 1653   ipratropium (ATROVENT) 0.03 % nasal spray 2 spray  2 spray Each Nare BID Lurene Shadow, MD  2 spray at 01/12/21 0829   ipratropium-albuterol (DUONEB) 0.5-2.5 (3) MG/3ML nebulizer solution 3 mL  3 mL Nebulization Q4H Lurene Shadow, MD   3 mL at 01/12/21 1555   lactase (LACTAID) tablet 3,000 Units  3,000 Units Oral TID WC Lurene Shadow, MD   3,000 Units at 01/12/21 1553   magnesium hydroxide (MILK OF MAGNESIA) suspension 30 mL  30 mL Oral Daily PRN Lurene Shadow, MD       methocarbamol (ROBAXIN) tablet 1,000 mg  1,000 mg Oral BID Lurene Shadow, MD   1,000 mg at 01/12/21 0830   metoprolol tartrate (LOPRESSOR) tablet 100 mg  100 mg Oral BID Scoggins, Amber, NP   100 mg at 01/12/21 9417   Muscle Rub CREA 1 application  1 application Topical TID Lurene Shadow, MD   1 application at 01/12/21 0839   ondansetron (ZOFRAN) tablet 4 mg  4 mg Oral Q6H PRN Lurene Shadow, MD       Or   ondansetron [facility]) injection 4 mg  4 mg Intravenous Q6H PRN Lurene Shadow, MD   4 mg at 01/11/21 0226   oxybutynin (DITROPAN-XL) 24 hr tablet 10 mg  10 mg Oral Daily Lurene Shadow, MD   10 mg at 01/12/21 0830   pantoprazole (PROTONIX) EC tablet 40 mg  40 mg Oral BID AC Lurene Shadow, MD   40 mg at 01/12/21 1554   polyethylene glycol (MIRALAX / GLYCOLAX) packet 17 g  17 g Oral Daily Lurene Shadow, MD   17 g at 01/12/21 0828   potassium chloride SA (KLOR-CON) CR tablet 40 mEq  40 mEq Oral Daily Lurene Shadow, MD   40 mEq at 01/12/21 0826   predniSONE (DELTASONE) tablet 40 mg  40 mg Oral Q breakfast Lurene Shadow, MD   40 mg at 01/12/21 4081   promethazine (PHENERGAN) 12.5 mg in sodium chloride 0.9 % 50 mL IVPB  12.5 mg  Intravenous Q6H PRN Lurene Shadow, MD   Stopped at 01/10/21 1154   psyllium (HYDROCIL/METAMUCIL) 1 packet  1 packet Oral Daily Lurene Shadow, MD   1 packet at 01/12/21 0828   senna-docusate (Senokot-S) tablet 1 tablet  1 tablet Oral QHS Lurene Shadow, MD       simethicone (MYLICON) chewable tablet 80 mg  80 mg Oral QID PRN Lurene Shadow, MD       traMADol Janean Sark) tablet 100 mg  100 mg Oral Q8H PRN Lurene Shadow, MD       traZODone (DESYREL) tablet 100 mg  100 mg Oral QHS Lurene Shadow, MD   100 mg at 01/11/21 2125     Discharge Medications: Please see discharge summary for a list of discharge medications.  Relevant Imaging Results:  Relevant Lab Results:   Additional Information SS #: [id number]  Azelia Reiger E Rodney Wigger, LCSW

## 2021-01-12 NOTE — Evaluation (Signed)
Physical Therapy Evaluation Patient Details Name: Raymond Hays MRN: [id number] DOB: [birth year] Today's Date: 01/12/2021  History of Present Illness  Raymond Hays is a 75 y.o. male with past medical history significant for COPD, type II DM, paroxysmal atrial fibrillation, dyslipidemia, osteoarthritis, hypertension, obesity, who presented to the hospital because of worsening shortness of breath.  His oxygen saturation was in the mid 80s when EMS picked him up.  He was placed on BiPAP and was transported to the emergency room for further evaluation.     He was admitted to the hospital for acute hypoxemic respiratory failure and COPD exacerbation.  Clinical Impression  The pt presents in good spirits. He reports that prior to admission he was independent with bed mobility and UE ADLs only. At this time the pt presents with need for Mod A for bed mobility. He is educated on the importance of upright positioning and also demonstrated improved oxygen saturation in the upright sitting position. At this time PT will follow for improving independence with bed mobility. Would recommend transfers OOB to chair via hoyer as this is his baseline.        Recommendations for follow up therapy are one component of a multi-disciplinary discharge planning process, led by the attending physician.  Recommendations may be updated based on patient status, additional functional criteria and insurance authorization.  Follow Up Recommendations SNF    Equipment Recommendations       Recommendations for Other Services       Precautions / Restrictions Precautions Precautions: Fall Restrictions Weight Bearing Restrictions: No      Mobility  Bed Mobility Overal bed mobility: Needs Assistance Bed Mobility: Supine to Sit     Supine to sit: Mod assist;HOB elevated     General bed mobility comments: Use of bed features and draw sheet in order to mobilize to EOB.    Transfers                 General  transfer comment: Dependent with transfers at baseline, not attempted. Trialed lateral scooting, dependent.  Ambulation/Gait             General Gait Details: w/c mobility at baseline  Stairs            Wheelchair Mobility    Modified Rankin (Stroke Patients Only)       Balance Overall balance assessment: Needs assistance Sitting-balance support: No upper extremity supported Sitting balance-Leahy Scale: Fair   Postural control: Posterior lean     Standing balance comment: Not attempted as pt is dependent at baseline.                             Pertinent Vitals/Pain Pain Assessment: No/denies pain    Home Living Family/patient expects to be discharged to:: Skilled nursing facility                 Additional Comments: Pt resides at [facility]    Prior Function Level of Independence: Needs assistance   Gait / Transfers Assistance Needed: Pt reported requiring assistance with transfer with motorized sit<>stand machine or hoyer lift. He states that he can propel w/c.  ADL's / Homemaking Assistance Needed: Pt reports being independent with feeding, UE dressing, UE grooming. Max A for all LE ADLs.  Comments: Pt reports Mod I with bed mobility prior to admission with use of a hospital bed.     Hand Dominance   Dominant Hand: Right  Extremity/Trunk Assessment   Upper Extremity Assessment Upper Extremity Assessment: Generalized weakness    Lower Extremity Assessment Lower Extremity Assessment: Generalized weakness;RLE deficits/detail;LLE deficits/detail RLE Deficits / Details: Reports foot impairment LLE Deficits / Details: Reports muscles wasting (observed) d/t nerve impairment       Communication   Communication: No difficulties  Cognition Arousal/Alertness: Awake/alert Behavior During Therapy: WFL for tasks assessed/performed Overall Cognitive Status: Within Functional Limits for tasks assessed                                         General Comments      Exercises Other Exercises Other Exercises: Static sitting at EOB x8 min. Pt educated and completed incentice spirometer at the EOB with bilateral foot flat contact.   Assessment/Plan    PT Assessment Patient needs continued PT services  PT Problem List Decreased mobility;Decreased coordination;Decreased strength;Decreased balance       PT Treatment Interventions      PT Goals (Current goals can be found in the Care Plan section)  Acute Rehab PT Goals Patient Stated Goal: improved independence with bed mobility. PT Goal Formulation: With patient Time For Goal Achievement: 01/26/21 Potential to Achieve Goals: Good    Frequency Min 2X/week   Barriers to discharge        Co-evaluation               AM-PAC PT "6 Clicks" Mobility  Outcome Measure Help needed turning from your back to your side while in a flat bed without using bedrails?: A Lot Help needed moving from lying on your back to sitting on the side of a flat bed without using bedrails?: A Lot Help needed moving to and from a bed to a chair (including a wheelchair)?: Total Help needed standing up from a chair using your arms (e.g., wheelchair or bedside chair)?: Total Help needed to walk in hospital room?: Total Help needed climbing 3-5 steps with a railing? : Total 6 Click Score: 8    End of Session Equipment Utilized During Treatment: Oxygen Activity Tolerance: Patient tolerated treatment well Patient left: in bed;with call bell/phone within reach Nurse Communication: Mobility status PT Visit Diagnosis: Muscle weakness (generalized) (M62.81)    Time: 1223-1300 PT Time Calculation (min) (ACUTE ONLY): 37 min   Charges:   PT Evaluation $PT Eval Moderate Complexity: 1 Mod PT Treatments $Therapeutic Activity: 23-37 mins        1:51 PM, 01/12/21 Alanzo Lamb A. Mordecai Maes PT, DPT Physical Therapist - [facility]   Daisey Caloca A Kayden Amend 01/12/2021, 1:48 PM

## 2021-01-12 NOTE — Progress Notes (Addendum)
Progress Note    Raymond Hays  HER:[id number] DOB: Jun 07, 1945  DOA: 01/09/2021 PCP: [facility]      Brief Narrative:    Medical records reviewed and are as summarized below:  Raymond Hays is a 75 y.o. male with past medical history significant for COPD, type II DM, paroxysmal atrial fibrillation, dyslipidemia, osteoarthritis, hypertension, obesity, who presented to the hospital because of worsening shortness of breath.  His oxygen saturation was in the mid 80s when EMS picked him up.  He was placed on BiPAP and was transported to the emergency room for further evaluation.  He was admitted to the hospital for acute hypoxemic respiratory failure and COPD exacerbation.    Assessment/Plan:   Active Problems:   AF (paroxysmal atrial fibrillation) (HCC)   COPD with acute exacerbation (HCC)   Acute respiratory failure (HCC)    Body mass index is 31.45 kg/m.  (Obesity)    COPD exacerbation, severe cough: Continue prednisone, bronchodilators and empiric IV antibiotics.  Repeat chest x-ray today.  Continue antitussives and use of incentive spirometer.  Start chest physiotherapy  Acute hypoxemic respiratory failure: He is down from 5 L/min to 2 L/min oxygen via nasal cannula.  Taper off oxygen as able.  Probable acute diastolic CHF: Continue IV Lasix as able.  2D echo in July 2022 showed normal EF, severely dilated left atrium, grade 2 diastolic dysfunction.  Lactic acidosis: Improved.  This was probably from hypoxia.  Atrial fibrillation with RVR: Heart rate has improved.  He remains on IV amiodarone drip.  Continue oral metoprolol.  Follow-up with cardiologist.  Mildly elevated troponins: This is likely from demand ischemia.  Hypertension: Continue metoprolol  Persistent nausea and vomiting, ileus: Improved.  Advance diet from full liquid to diabetic diet.  AKI: Creatinine is trending down. Monitor BMP.  Parkinson's disease: Continue Sinemet  Bilateral lower  extremity edema (R>L): Probably due to CHF. No evidence of DVT on venous duplex.  Rash on the face and right armpit: Benadryl as needed for itching.  Other comorbidities include type II DM, BPH, dyslipidemia, remote use of crack cocaine and tobacco.     Diet Order             Diet Carb Modified Fluid consistency: Thin; Room service appropriate? Yes  Diet effective now                      Consultants: General surgeon Cardiologist  Procedures: None    Medications:    acetylcysteine  4 mL Oral BID   apixaban  5 mg Oral BID   atorvastatin  20 mg Oral Daily   azithromycin  500 mg Oral Daily   carbidopa-levodopa  1 tablet Oral TID AC   escitalopram  5 mg Oral Daily   feeding supplement (GLUCERNA SHAKE)  237 mL Oral BID BM   ferrous sulfate  325 mg Oral TID WC   finasteride  5 mg Oral Daily   fluticasone  1 spray Each Nare Daily   fluticasone furoate-vilanterol  1 puff Inhalation Daily   And   umeclidinium bromide  1 puff Inhalation Daily   furosemide  20 mg Intravenous Daily   gabapentin  800 mg Oral BID   guaiFENesin  600 mg Oral BID   insulin aspart  0-15 Units Subcutaneous TID AC & HS   ipratropium  2 spray Each Nare BID   ipratropium-albuterol  3 mL Nebulization Q4H   lactase  3,000 Units Oral TID  WC   methocarbamol  1,000 mg Oral BID   metoprolol tartrate  100 mg Oral BID   Muscle Rub  1 application Topical TID   oxybutynin  10 mg Oral Daily   pantoprazole  40 mg Oral BID AC   polyethylene glycol  17 g Oral Daily   potassium chloride  40 mEq Oral Daily   predniSONE  40 mg Oral Q breakfast   psyllium  1 packet Oral Daily   senna-docusate  1 tablet Oral QHS   traZODone  100 mg Oral QHS   Continuous Infusions:  amiodarone Stopped (01/12/21 1004)   cefTRIAXone (ROCEPHIN)  IV Stopped (01/11/21 1840)   promethazine (PHENERGAN) injection (IM or IVPB) Stopped (01/10/21 1154)     Anti-infectives (From admission, onward)    Start     Dose/Rate Route  Frequency Ordered Stop   01/12/21 1800  azithromycin (ZITHROMAX) tablet 500 mg        500 mg Oral Daily 01/12/21 1005 01/14/21 1759   01/10/21 1800  azithromycin (ZITHROMAX) 500 mg in sodium chloride 0.9 % 250 mL IVPB  Status:  Discontinued        500 mg 250 mL/hr over 60 Minutes Intravenous Every 24 hours 01/10/21 0024 01/12/21 1004   01/10/21 1700  cefTRIAXone (ROCEPHIN) 1 g in sodium chloride 0.9 % 100 mL IVPB  Status:  Discontinued        1 g 200 mL/hr over 30 Minutes Intravenous Every 24 hours 01/09/21 2003 01/10/21 0025   01/10/21 1700  cefTRIAXone (ROCEPHIN) 2 g in sodium chloride 0.9 % 100 mL IVPB        2 g 200 mL/hr over 30 Minutes Intravenous Every 24 hours 01/10/21 0025 01/14/21 1659   01/09/21 1730  cefTRIAXone (ROCEPHIN) 2 g in sodium chloride 0.9 % 100 mL IVPB        2 g 200 mL/hr over 30 Minutes Intravenous  Once 01/09/21 1720 01/09/21 1804   01/09/21 1730  azithromycin (ZITHROMAX) 500 mg in sodium chloride 0.9 % 250 mL IVPB        500 mg 250 mL/hr over 60 Minutes Intravenous  Once 01/09/21 1720 01/09/21 2002              Family Communication/Anticipated D/C date and plan/Code Status   DVT prophylaxis:  apixaban (ELIQUIS) tablet 5 mg     Code Status: Full Code  Family Communication: None Disposition Plan:    Status is: Inpatient  Remains inpatient appropriate because:IV treatments appropriate due to intensity of illness or inability to take PO and Inpatient level of care appropriate due to severity of illness  Dispo: The patient is from: Home              Anticipated d/c is to: Home              Patient currently is not medically stable to d/c.   Difficult to place patient No           Subjective:   Interval events noted.  He complains of frequent cough but is unable to produce much sputum.  Breathing is better.  No nausea, vomiting or abdominal pain.  Objective:    Vitals:   01/12/21 0855 01/12/21 0910 01/12/21 1128 01/12/21 1141   BP:    93/82  Pulse: 83  73 66  Resp: (!) 22 20 20 17   Temp:    (!) 97.4 F (36.3 C)  TempSrc:      SpO2: 99%  98%  98%  Weight:      Height:       No data found.   Intake/Output Summary (Last 24 hours) at 01/12/2021 1346 Last data filed at 01/12/2021 1010 Gross per 24 hour  Intake 648.62 ml  Output 1175 ml  Net -526.38 ml   Filed Weights   01/09/21 1554 01/12/21 0527  Weight: 105.8 kg 105.2 kg    Exam:  GEN: NAD SKIN: Warm and dry EYES: No pallor or icterus ENT: MMM CV no pallor or icterus: RRR PULM: Rhonchorous breath sounds, bilateral wheezing, chest sounds congested ABD: soft, ND, NT, +BS CNS: AAO x 3, non focal EXT: Right lower extremity edema, left foot edema.  No erythema or tenderness        Data Reviewed:   I have personally reviewed following labs and imaging studies:  Labs: Labs show the following:   Basic Metabolic Panel: Recent Labs  Lab 01/09/21 1547 01/10/21 0640 01/10/21 1416 01/11/21 0707 01/12/21 0440  NA 137 137 136 141 138  K 3.9 4.4 3.8 4.0 3.1*  CL 97* 99 94* 100 101  CO2 30 29 29 30 30   GLUCOSE 157* 181* 183* 172* 107*  BUN 28* 26* 29* 27* 25*  CREATININE 1.35* 1.26* 1.31* 1.28* 1.16  CALCIUM 8.8* 8.7* 8.8* 8.9 8.4*  MG 1.9  --   --   --  2.0   GFR Estimated Creatinine Clearance: 67.9 mL/min (by C-G formula based on SCr of 1.16 mg/dL). Liver Function Tests: Recent Labs  Lab 01/09/21 1547 01/10/21 1416  AST 12* 18  ALT 8 14  ALKPHOS 67 95  BILITOT 1.0 0.7  PROT 6.6 6.4*  ALBUMIN 3.2* 3.0*   No results for input(s): LIPASE, AMYLASE in the last 168 hours. No results for input(s): AMMONIA in the last 168 hours. Coagulation profile No results for input(s): INR, PROTIME in the last 168 hours.  CBC: Recent Labs  Lab 01/09/21 1547 01/10/21 0640 01/10/21 1416 01/11/21 0707  WBC 8.2 5.6 8.9 8.4  NEUTROABS 6.0  --  8.1* 7.0  HGB 12.4* 12.1* 12.7* 11.9*  HCT 36.6* 36.9* 37.3* 35.5*  MCV 92.9 93.7 93.0 93.7   PLT 235 223 273 273   Cardiac Enzymes: No results for input(s): CKTOTAL, CKMB, CKMBINDEX, TROPONINI in the last 168 hours. BNP (last 3 results) No results for input(s): PROBNP in the last 8760 hours. CBG: Recent Labs  Lab 01/11/21 1128 01/11/21 1630 01/11/21 2058 01/12/21 0758 01/12/21 1146  GLUCAP 173* 177* 148* 134* 155*   D-Dimer: Recent Labs    01/09/21 1547  DDIMER 3.65*   Hgb A1c: Recent Labs    01/10/21 0640  HGBA1C 6.7*   Lipid Profile: No results for input(s): CHOL, HDL, LDLCALC, TRIG, CHOLHDL, LDLDIRECT in the last 72 hours. Thyroid function studies: Recent Labs    01/11/21 0707  TSH 1.627   Anemia work up: No results for input(s): VITAMINB12, FOLATE, FERRITIN, TIBC, IRON, RETICCTPCT in the last 72 hours. Sepsis Labs: Recent Labs  Lab 01/09/21 1547 01/09/21 1548 01/09/21 1738 01/10/21 0640 01/10/21 1416 01/10/21 1417 01/11/21 0707  PROCALCITON 0.22  --   --   --   --   --   --   WBC 8.2  --   --  5.6 8.9  --  8.4  LATICACIDVEN  --  2.7* 2.4*  --   --  3.0* 2.2*    Microbiology Recent Results (from the past 240 hour(s))  Blood culture (single)  Status: None (Preliminary result)   Collection Time: 01/09/21  3:47 PM   Specimen: BLOOD  Result Value Ref Range Status   Specimen Description BLOOD LEFT UPPER ARM  Final   Special Requests   Final    BOTTLES DRAWN AEROBIC AND ANAEROBIC Blood Culture adequate volume   Culture   Final    NO GROWTH 3 DAYS Performed at [facility], [address], [place], [place] [address]    Report Status PENDING  Incomplete  Blood culture (single)     Status: None (Preliminary result)   Collection Time: 01/09/21  3:48 PM   Specimen: BLOOD  Result Value Ref Range Status   Specimen Description BLOOD RIGHT ANTECUBITAL  Final   Special Requests   Final    BOTTLES DRAWN AEROBIC AND ANAEROBIC Blood Culture adequate volume   Culture   Final    NO GROWTH 3 DAYS Performed at [facility], [address], [place], [place] [address]    Report Status PENDING  Incomplete  Resp Panel by RT-PCR (Flu A&B, Covid) Nasopharyngeal Swab     Status: None   Collection Time: 01/10/21  2:06 AM   Specimen: Nasopharyngeal Swab; Nasopharyngeal(NP) swabs in vial transport medium  Result Value Ref Range Status   SARS Coronavirus 2 by RT PCR NEGATIVE NEGATIVE Final    Comment: (NOTE) SARS-CoV-2 target nucleic acids are NOT DETECTED.  The SARS-CoV-2 RNA is generally detectable in upper respiratory specimens during the acute phase of infection. The lowest concentration of SARS-CoV-2 viral copies this assay can detect is 138 copies/mL. A negative result does not preclude SARS-Cov-2 infection and should not be used as the sole basis for treatment or other patient management decisions. A negative result may occur with  improper specimen collection/handling, submission of specimen other than nasopharyngeal swab, presence of viral mutation(s) within the areas targeted by this assay, and inadequate number of viral copies(<138 copies/mL). A negative result must be combined with clinical observations, patient history, and epidemiological information. The expected result is Negative.  Fact Sheet for Patients:  BloggerCourse.com  Fact Sheet for Healthcare Providers:  SeriousBroker.it  This test is no t yet approved or cleared by the [place] FDA and  has been authorized for detection and/or diagnosis of SARS-CoV-2 by FDA under an Emergency Use Authorization (EUA). This EUA will remain  in effect (meaning this test can be used) for the duration of the COVID-19 declaration under Section 564(b)(1) of the Act, 21 U.S.C.section 360bbb-3(b)(1), unless the authorization is terminated  or revoked sooner.       Influenza A by PCR NEGATIVE NEGATIVE Final   Influenza B by PCR NEGATIVE NEGATIVE Final    Comment: (NOTE) The Xpert Xpress SARS-CoV-2/FLU/RSV  plus assay is intended as an aid in the diagnosis of influenza from Nasopharyngeal swab specimens and should not be used as a sole basis for treatment. Nasal washings and aspirates are unacceptable for Xpert Xpress SARS-CoV-2/FLU/RSV testing.  Fact Sheet for Patients: BloggerCourse.com  Fact Sheet for Healthcare Providers: SeriousBroker.it  This test is not yet approved or cleared by the [place] FDA and has been authorized for detection and/or diagnosis of SARS-CoV-2 by FDA under an Emergency Use Authorization (EUA). This EUA will remain in effect (meaning this test can be used) for the duration of the COVID-19 declaration under Section 564(b)(1) of the Act, 21 U.S.C. section 360bbb-3(b)(1), unless the authorization is terminated or revoked.  Performed at [facility], [address] [place], [place] [address]  MRSA Next Gen by PCR, Nasal     Status: Abnormal   Collection Time: 01/12/21 12:00 PM   Specimen: Nasal Mucosa; Nasal Swab  Result Value Ref Range Status   MRSA by PCR Next Gen DETECTED (A) NOT DETECTED Final    Comment: RESULT CALLED TO, READ BACK BY AND VERIFIED WITH: JAMIE EANES 01/12/21 1326 SLM (NOTE) The GeneXpert MRSA Assay (FDA approved for NASAL specimens only), is one component of a comprehensive MRSA colonization surveillance program. It is not intended to diagnose MRSA infection nor to guide or monitor treatment for MRSA infections. Test performance is not FDA approved in patients less than 13 years old. Performed at [facility], [address] [place], [place] [address]     Procedures and diagnostic studies:  CT ABDOMEN PELVIS WO CONTRAST  Result Date: 01/10/2021 CLINICAL DATA:  Nausea, vomiting, upper abdominal pain EXAM: CT ABDOMEN AND PELVIS WITHOUT CONTRAST TECHNIQUE: Multidetector CT imaging of the abdomen and pelvis was performed following the standard protocol  without IV contrast. COMPARISON:  CT abdomen/pelvis 10/23/2020 FINDINGS: Lower chest: There is a small left pleural effusion. There is dependent subsegmental atelectasis in both lung bases. Mitral annular calcifications, aortic valve calcifications, and coronary artery calcifications are seen. There is no pericardial effusion. Hepatobiliary: A hypodense lesion in the right hepatic lobe near the dome is unchanged, likely a cyst. There are no other focal lesions, within the confines of noncontrast technique. There is cholelithiasis without evidence of acute cholecystitis. There is no biliary ductal dilatation. Pancreas: Unremarkable. Spleen: Unremarkable. Adrenals/Urinary Tract: The adrenals are unremarkable. Bilateral renal parenchymal atrophy is again seen. A 2.2 cm hypodense lesion adjacent to the posterior left renal cortex is unchanged, likely an exophytic cyst. Additional subcentimeter hypodense lesions in the kidneys are too small to characterize but likely reflect additional small cysts. There is a punctate nonobstructing left lower pole stone, unchanged. There are no other calculi. There is no hydronephrosis or hydroureter. The bladder is decompressed by a suprapubic catheter and not well assessed. Stomach/Bowel: There is an enteric catheter in place with the tip in the stomach. The stomach is otherwise unremarkable. There is no evidence of bowel obstruction. There is a moderate stool burden in the rectum, similar to the prior study. There is no finding to suggest stercoral colitis. There is colonic diverticulosis without evidence of acute diverticulitis. Vascular/Lymphatic: There is scattered calcified atherosclerotic plaque in the nonaneurysmal abdominal aorta. There is no portal venous gas. There is no abdominal or pelvic lymphadenopathy. Reproductive: The prostate and seminal vesicles are not well assessed. Other: There is no ascites or free air. Musculoskeletal: Bilateral hip arthroplasty hardware is  noted. The hardware is grossly within expected limits to the level imaged. There is advanced multilevel degenerative change of the lumbar spine with fusion of the L4 and L5 vertebral bodies, vacuum disc phenomenon at L2-L3, L3-L4, and L5-S1, and flowing anterior osteophytes in the lower thoracic spine. There are postsurgical changes reflecting decompressive laminectomies at L3 through L5. The findings are overall unchanged. There is no acute osseous abnormality or aggressive osseous lesion. IMPRESSION: 1. No acute findings in the abdomen or pelvis. 2. Cholelithiasis without evidence of acute cholecystitis. 3. Diverticulosis without evidence of acute diverticulitis. 4. Moderate stool burden in the rectum without evidence of stercoral colitis. 5. Unchanged punctate nonobstructing left lower pole renal stone. Aortic Atherosclerosis (ICD10-I70.0). Electronically Signed   By: Lesia Hausen M.D.   On: 01/10/2021 16:09  LOS: 3 days   Ayad Nieman  [facility]   Pager on [email or web address]. If 7PM-7AM, please contact night-coverage at [email or web address]     01/12/2021, 1:46 PM

## 2021-01-13 DIAGNOSIS — J441 Chronic obstructive pulmonary disease with (acute) exacerbation: Secondary | ICD-10-CM | POA: Diagnosis not present

## 2021-01-13 DIAGNOSIS — I480 Paroxysmal atrial fibrillation: Secondary | ICD-10-CM | POA: Diagnosis not present

## 2021-01-13 DIAGNOSIS — J9601 Acute respiratory failure with hypoxia: Secondary | ICD-10-CM | POA: Diagnosis not present

## 2021-01-13 LAB — GLUCOSE, CAPILLARY
Glucose-Capillary: 149 mg/dL — ABNORMAL HIGH (ref 70–99)
Glucose-Capillary: 143 mg/dL — ABNORMAL HIGH (ref 70–99)
Glucose-Capillary: 163 mg/dL — ABNORMAL HIGH (ref 70–99)
Glucose-Capillary: 145 mg/dL — ABNORMAL HIGH (ref 70–99)

## 2021-01-13 LAB — BASIC METABOLIC PANEL
Anion gap: 10 (ref 5–15)
BUN: 23 mg/dL (ref 8–23)
CO2: 31 mmol/L (ref 22–32)
Calcium: 8.8 mg/dL — ABNORMAL LOW (ref 8.9–10.3)
Chloride: 97 mmol/L — ABNORMAL LOW (ref 98–111)
Creatinine, Ser: 1.2 mg/dL (ref 0.61–1.24)
GFR, Estimated: >60 mL/min (ref >60)
Glucose, Bld: 143 mg/dL — ABNORMAL HIGH (ref 70–99)
Potassium: 3.4 mmol/L — ABNORMAL LOW (ref 3.5–5.1)
Sodium: 138 mmol/L (ref 135–145)

## 2021-01-13 LAB — MAGNESIUM: Magnesium: 2.1 mg/dL (ref 1.7–2.4)

## 2021-01-13 MED ORDER — AMIODARONE HCL 200 MG PO TABS
400.0000 mg | ORAL_TABLET | Freq: Every day | ORAL | Status: DC
  Administered 2021-01-13 – 2021-01-22 (×10): 400 mg via ORAL
  Filled 2021-01-13 (×10): qty 2

## 2021-01-13 NOTE — Progress Notes (Addendum)
Progress Note    Raymond Hays  VOZ:[id number] DOB: June 09, 1945  DOA: 01/09/2021 PCP: [facility]      Brief Narrative:    Medical records reviewed and are as summarized below:  Raymond Hays is a 75 y.o. male with past medical history significant for COPD, type II DM, paroxysmal atrial fibrillation, dyslipidemia, osteoarthritis, hypertension, obesity, who presented to the hospital because of worsening shortness of breath.  His oxygen saturation was in the mid 80s when EMS picked him up.  He was placed on BiPAP and was transported to the emergency room for further evaluation.  He was admitted to the hospital for acute hypoxemic respiratory failure and COPD exacerbation.    Assessment/Plan:   Active Problems:   AF (paroxysmal atrial fibrillation) (HCC)   COPD with acute exacerbation (HCC)   Acute respiratory failure (HCC)    Body mass index is 31.17 kg/m.  (Obesity)    COPD exacerbation, severe cough: Repeat chest x-ray on 01/12/2021 did not show any acute abnormality.  Continue prednisone, bronchodilators and empiric IV antibiotics.   Continue antitussives, incentive spirometer and chest physiotherapy.    Acute hypoxemic respiratory failure: He is on 2 L/min oxygen by nasal collar.  Taper off oxygen as able.  Probable acute diastolic CHF: Continue IV Lasix.  2D echo in July 2022 showed normal EF, severely dilated left atrium, grade 2 diastolic dysfunction.  Lactic acidosis: Improved.  This was probably from hypoxia.  Atrial fibrillation with RVR: Heart rate has improved.  Case discussed with Dr. Welton Flakes, cardiologist.  He recommended switching IV amiodarone infusion to oral amiodarone 400 mg daily.  Continue metoprolol.  Hypokalemia: Replace potassium and monitor levels  Mildly elevated troponins: This is likely from demand ischemia.  Hypertension: Continue metoprolol  Persistent nausea and vomiting, ileus: Nausea and vomiting recurred.  Patient made n.p.o. for  now.  AKI: Creatinine is stable  Parkinson's disease: Continue Sinemet  Bilateral lower extremity edema (R>L): Probably due to CHF. No evidence of DVT on venous duplex.  Rash on the face and right armpit: Bilateral as needed for itching.  Other comorbidities include type II DM, BPH, dyslipidemia, remote use of crack cocaine and tobacco.     Diet Order             Diet NPO time specified Except for: Sips with Meds  Diet effective now                      Consultants: General surgeon Cardiologist  Procedures: None    Medications:    acetylcysteine  4 mL Oral BID   apixaban  5 mg Oral BID   atorvastatin  20 mg Oral Daily   carbidopa-levodopa  1 tablet Oral TID AC   escitalopram  5 mg Oral Daily   feeding supplement (GLUCERNA SHAKE)  237 mL Oral BID BM   ferrous sulfate  325 mg Oral TID WC   finasteride  5 mg Oral Daily   fluticasone  1 spray Each Nare Daily   fluticasone furoate-vilanterol  1 puff Inhalation Daily   And   umeclidinium bromide  1 puff Inhalation Daily   furosemide  20 mg Intravenous Daily   gabapentin  800 mg Oral BID   guaiFENesin  600 mg Oral BID   insulin aspart  0-15 Units Subcutaneous TID AC & HS   ipratropium  2 spray Each Nare BID   ipratropium-albuterol  3 mL Nebulization Q4H   lactase  3,000  Units Oral TID WC   methocarbamol  1,000 mg Oral BID   metoprolol tartrate  100 mg Oral BID   Muscle Rub  1 application Topical TID   oxybutynin  10 mg Oral Daily   pantoprazole  40 mg Oral BID AC   polyethylene glycol  17 g Oral Daily   potassium chloride  40 mEq Oral Daily   predniSONE  40 mg Oral Q breakfast   psyllium  1 packet Oral Daily   senna-docusate  1 tablet Oral QHS   traZODone  100 mg Oral QHS   Continuous Infusions:  amiodarone 30 mg/hr (01/13/21 1606)   cefTRIAXone (ROCEPHIN)  IV 2 g (01/13/21 1609)   promethazine (PHENERGAN) injection (IM or IVPB) 200 mL/hr at 01/13/21 0600     Anti-infectives (From admission,  onward)    Start     Dose/Rate Route Frequency Ordered Stop   01/12/21 1800  azithromycin (ZITHROMAX) tablet 500 mg        500 mg Oral Daily 01/12/21 1005 01/13/21 1607   01/10/21 1800  azithromycin (ZITHROMAX) 500 mg in sodium chloride 0.9 % 250 mL IVPB  Status:  Discontinued        500 mg 250 mL/hr over 60 Minutes Intravenous Every 24 hours 01/10/21 0024 01/12/21 1004   01/10/21 1700  cefTRIAXone (ROCEPHIN) 1 g in sodium chloride 0.9 % 100 mL IVPB  Status:  Discontinued        1 g 200 mL/hr over 30 Minutes Intravenous Every 24 hours 01/09/21 2003 01/10/21 0025   01/10/21 1700  cefTRIAXone (ROCEPHIN) 2 g in sodium chloride 0.9 % 100 mL IVPB        2 g 200 mL/hr over 30 Minutes Intravenous Every 24 hours 01/10/21 0025 01/14/21 1659   01/09/21 1730  cefTRIAXone (ROCEPHIN) 2 g in sodium chloride 0.9 % 100 mL IVPB        2 g 200 mL/hr over 30 Minutes Intravenous  Once 01/09/21 1720 01/09/21 1804   01/09/21 1730  azithromycin (ZITHROMAX) 500 mg in sodium chloride 0.9 % 250 mL IVPB        500 mg 250 mL/hr over 60 Minutes Intravenous  Once 01/09/21 1720 01/09/21 2002              Family Communication/Anticipated D/C date and plan/Code Status   DVT prophylaxis:  apixaban (ELIQUIS) tablet 5 mg     Code Status: Full Code  Family Communication: None Disposition Plan:    Status is: Inpatient  Remains inpatient appropriate because:IV treatments appropriate due to intensity of illness or inability to take PO and Inpatient level of care appropriate due to severity of illness  Dispo: The patient is from: Home              Anticipated d/c is to: Home              Patient currently is not medically stable to d/c.   Difficult to place patient No           Subjective:   Interval events noted.  He complains of nausea and vomiting.  He said he vomited about 3 times last night and twice this morning.  Objective:    Vitals:   01/13/21 0500 01/13/21 0600 01/13/21 0734  01/13/21 1112  BP:   (!) 139/96 129/88  Pulse:   94 97  Resp: (!) 21 20 18 18   Temp:   98 F (36.7 C) 98 F (36.7 C)  TempSrc:  SpO2:   99% 98%  Weight:      Height:       No data found.   Intake/Output Summary (Last 24 hours) at 01/13/2021 1610 Last data filed at 01/13/2021 1500 Gross per 24 hour  Intake 774.17 ml  Output 400 ml  Net 374.17 ml   Filed Weights   01/09/21 1554 01/12/21 0527 01/13/21 0440  Weight: 105.8 kg 105.2 kg 104.2 kg    Exam:  GEN: NAD SKIN: Warm and dry EYES: No pallor or icterus ENT: MMM CV: RRR PULM: Right basilar rales.  No wheezing heard. ABD: soft, distended/obese, NT, +BS CNS: AAO x 3, non focal EXT: Right lower extremity edema, left foot edema.  No tenderness or redness noted            Data Reviewed:   I have personally reviewed following labs and imaging studies:  Labs: Labs show the following:   Basic Metabolic Panel: Recent Labs  Lab 01/09/21 1547 01/10/21 0640 01/10/21 1416 01/11/21 0707 01/12/21 0440 01/13/21 0858  NA 137 137 136 141 138 138  K 3.9 4.4 3.8 4.0 3.1* 3.4*  CL 97* 99 94* 100 101 97*  CO2 30 29 29 30 30 31   GLUCOSE 157* 181* 183* 172* 107* 143*  BUN 28* 26* 29* 27* 25* 23  CREATININE 1.35* 1.26* 1.31* 1.28* 1.16 1.20  CALCIUM 8.8* 8.7* 8.8* 8.9 8.4* 8.8*  MG 1.9  --   --   --  2.0 2.1   GFR Estimated Creatinine Clearance: 65.3 mL/min (by C-G formula based on SCr of 1.2 mg/dL). Liver Function Tests: Recent Labs  Lab 01/09/21 1547 01/10/21 1416  AST 12* 18  ALT 8 14  ALKPHOS 67 95  BILITOT 1.0 0.7  PROT 6.6 6.4*  ALBUMIN 3.2* 3.0*   No results for input(s): LIPASE, AMYLASE in the last 168 hours. No results for input(s): AMMONIA in the last 168 hours. Coagulation profile No results for input(s): INR, PROTIME in the last 168 hours.  CBC: Recent Labs  Lab 01/09/21 1547 01/10/21 0640 01/10/21 1416 01/11/21 0707  WBC 8.2 5.6 8.9 8.4  NEUTROABS 6.0  --  8.1* 7.0  HGB  12.4* 12.1* 12.7* 11.9*  HCT 36.6* 36.9* 37.3* 35.5*  MCV 92.9 93.7 93.0 93.7  PLT 235 223 273 273   Cardiac Enzymes: No results for input(s): CKTOTAL, CKMB, CKMBINDEX, TROPONINI in the last 168 hours. BNP (last 3 results) No results for input(s): PROBNP in the last 8760 hours. CBG: Recent Labs  Lab 01/12/21 1146 01/12/21 1620 01/12/21 2030 01/13/21 0732 01/13/21 1111  GLUCAP 155* 188* 182* 149* 143*   D-Dimer: No results for input(s): DDIMER in the last 72 hours.  Hgb A1c: No results for input(s): HGBA1C in the last 72 hours.  Lipid Profile: No results for input(s): CHOL, HDL, LDLCALC, TRIG, CHOLHDL, LDLDIRECT in the last 72 hours. Thyroid function studies: Recent Labs    01/11/21 0707  TSH 1.627   Anemia work up: No results for input(s): VITAMINB12, FOLATE, FERRITIN, TIBC, IRON, RETICCTPCT in the last 72 hours. Sepsis Labs: Recent Labs  Lab 01/09/21 1547 01/09/21 1548 01/09/21 1738 01/10/21 0640 01/10/21 1416 01/10/21 1417 01/11/21 0707  PROCALCITON 0.22  --   --   --   --   --   --   WBC 8.2  --   --  5.6 8.9  --  8.4  LATICACIDVEN  --  2.7* 2.4*  --   --  3.0* 2.2*  Microbiology Recent Results (from the past 240 hour(s))  Blood culture (single)     Status: None (Preliminary result)   Collection Time: 01/09/21  3:47 PM   Specimen: BLOOD  Result Value Ref Range Status   Specimen Description BLOOD LEFT UPPER ARM  Final   Special Requests   Final    BOTTLES DRAWN AEROBIC AND ANAEROBIC Blood Culture adequate volume   Culture   Final    NO GROWTH 4 DAYS Performed at [facility], [address], [place], [place] [address]    Report Status PENDING  Incomplete  Blood culture (single)     Status: None (Preliminary result)   Collection Time: 01/09/21  3:48 PM   Specimen: BLOOD  Result Value Ref Range Status   Specimen Description BLOOD RIGHT ANTECUBITAL  Final   Special Requests   Final    BOTTLES DRAWN AEROBIC AND ANAEROBIC Blood Culture  adequate volume   Culture   Final    NO GROWTH 4 DAYS Performed at [facility], [address], [place], [place] [address]    Report Status PENDING  Incomplete  Resp Panel by RT-PCR (Flu A&B, Covid) Nasopharyngeal Swab     Status: None   Collection Time: 01/10/21  2:06 AM   Specimen: Nasopharyngeal Swab; Nasopharyngeal(NP) swabs in vial transport medium  Result Value Ref Range Status   SARS Coronavirus 2 by RT PCR NEGATIVE NEGATIVE Final    Comment: (NOTE) SARS-CoV-2 target nucleic acids are NOT DETECTED.  The SARS-CoV-2 RNA is generally detectable in upper respiratory specimens during the acute phase of infection. The lowest concentration of SARS-CoV-2 viral copies this assay can detect is 138 copies/mL. A negative result does not preclude SARS-Cov-2 infection and should not be used as the sole basis for treatment or other patient management decisions. A negative result may occur with  improper specimen collection/handling, submission of specimen other than nasopharyngeal swab, presence of viral mutation(s) within the areas targeted by this assay, and inadequate number of viral copies(<138 copies/mL). A negative result must be combined with clinical observations, patient history, and epidemiological information. The expected result is Negative.  Fact Sheet for Patients:  BloggerCourse.com  Fact Sheet for Healthcare Providers:  SeriousBroker.it  This test is no t yet approved or cleared by the [place] FDA and  has been authorized for detection and/or diagnosis of SARS-CoV-2 by FDA under an Emergency Use Authorization (EUA). This EUA will remain  in effect (meaning this test can be used) for the duration of the COVID-19 declaration under Section 564(b)(1) of the Act, 21 U.S.C.section 360bbb-3(b)(1), unless the authorization is terminated  or revoked sooner.       Influenza A by PCR NEGATIVE NEGATIVE Final    Influenza B by PCR NEGATIVE NEGATIVE Final    Comment: (NOTE) The Xpert Xpress SARS-CoV-2/FLU/RSV plus assay is intended as an aid in the diagnosis of influenza from Nasopharyngeal swab specimens and should not be used as a sole basis for treatment. Nasal washings and aspirates are unacceptable for Xpert Xpress SARS-CoV-2/FLU/RSV testing.  Fact Sheet for Patients: BloggerCourse.com  Fact Sheet for Healthcare Providers: SeriousBroker.it  This test is not yet approved or cleared by the [place] FDA and has been authorized for detection and/or diagnosis of SARS-CoV-2 by FDA under an Emergency Use Authorization (EUA). This EUA will remain in effect (meaning this test can be used) for the duration of the COVID-19 declaration under Section 564(b)(1) of the Act, 21 U.S.C. section 360bbb-3(b)(1), unless the authorization is  terminated or revoked.  Performed at [facility], [address] [place], [place] [address]   MRSA Next Gen by PCR, Nasal     Status: Abnormal   Collection Time: 01/12/21 12:00 PM   Specimen: Nasal Mucosa; Nasal Swab  Result Value Ref Range Status   MRSA by PCR Next Gen DETECTED (A) NOT DETECTED Final    Comment: RESULT CALLED TO, READ BACK BY AND VERIFIED WITH: JAMIE EANES 01/12/21 1326 SLM (NOTE) The GeneXpert MRSA Assay (FDA approved for NASAL specimens only), is one component of a comprehensive MRSA colonization surveillance program. It is not intended to diagnose MRSA infection nor to guide or monitor treatment for MRSA infections. Test performance is not FDA approved in patients less than 57 years old. Performed at [facility], [address] [place], [place] [address]     Procedures and diagnostic studies:  DG Chest [facility] 1 View  Result Date: 01/12/2021 CLINICAL DATA:  Cough.  COPD.  Diabetes. EXAM: PORTABLE CHEST 1 VIEW COMPARISON:  01/09/2021 FINDINGS: Cervical spine  fixation. Apical lordotic positioning. Patient rotated left. Cardiomegaly accentuated by AP portable technique. Atherosclerosis in the transverse aorta. No pleural effusion or pneumothorax. Low lung volumes with resultant pulmonary interstitial prominence. No overt congestive failure. Numerous leads and wires project over the chest. No lobar consolidation. IMPRESSION: Cardiomegaly and low lung volumes.  No acute findings. Aortic Atherosclerosis (ICD10-I70.0). Electronically Signed   By: Jeronimo Greaves M.D.   On: 01/12/2021 16:31               LOS: 4 days   Chrystian Cupples  [facility]   Pager on [email or web address]. If 7PM-7AM, please contact night-coverage at [email or web address]     01/13/2021, 4:10 PM

## 2021-01-14 DIAGNOSIS — J9601 Acute respiratory failure with hypoxia: Secondary | ICD-10-CM | POA: Diagnosis not present

## 2021-01-14 DIAGNOSIS — I480 Paroxysmal atrial fibrillation: Secondary | ICD-10-CM | POA: Diagnosis not present

## 2021-01-14 DIAGNOSIS — J441 Chronic obstructive pulmonary disease with (acute) exacerbation: Secondary | ICD-10-CM | POA: Diagnosis not present

## 2021-01-14 LAB — MAGNESIUM: Magnesium: 2.1 mg/dL (ref 1.7–2.4)

## 2021-01-14 LAB — GLUCOSE, CAPILLARY
Glucose-Capillary: 107 mg/dL — ABNORMAL HIGH (ref 70–99)
Glucose-Capillary: 127 mg/dL — ABNORMAL HIGH (ref 70–99)
Glucose-Capillary: 201 mg/dL — ABNORMAL HIGH (ref 70–99)
Glucose-Capillary: 223 mg/dL — ABNORMAL HIGH (ref 70–99)

## 2021-01-14 LAB — CULTURE, BLOOD (SINGLE)
Culture: NO GROWTH
Culture: NO GROWTH
Special Requests: ADEQUATE
Special Requests: ADEQUATE

## 2021-01-14 LAB — BASIC METABOLIC PANEL
Anion gap: 8 (ref 5–15)
BUN: 24 mg/dL — ABNORMAL HIGH (ref 8–23)
CO2: 33 mmol/L — ABNORMAL HIGH (ref 22–32)
Calcium: 8.9 mg/dL (ref 8.9–10.3)
Chloride: 97 mmol/L — ABNORMAL LOW (ref 98–111)
Creatinine, Ser: 1.03 mg/dL (ref 0.61–1.24)
GFR, Estimated: >60 mL/min (ref >60)
Glucose, Bld: 127 mg/dL — ABNORMAL HIGH (ref 70–99)
Potassium: 4.7 mmol/L (ref 3.5–5.1)
Sodium: 138 mmol/L (ref 135–145)

## 2021-01-14 MED ORDER — BISACODYL 10 MG RE SUPP: 10.0000 mg | Freq: Once | RECTAL | Status: DC

## 2021-01-14 NOTE — Progress Notes (Signed)
Progress Note    Raymond Hays  HKV:[id number] DOB: [birth year]  DOA: 01/09/2021 PCP: [facility]      Brief Narrative:    Medical records reviewed and are as summarized below:  Raymond Hays is a 75 y.o. male with past medical history significant for COPD, type II DM, paroxysmal atrial fibrillation, dyslipidemia, osteoarthritis, hypertension, obesity, who presented to the hospital because of worsening shortness of breath.  His oxygen saturation was in the mid 80s when EMS picked him up.  He was placed on BiPAP and was transported to the emergency room for further evaluation.  He was admitted to the hospital for acute hypoxemic respiratory failure and COPD exacerbation.    Assessment/Plan:   Active Problems:   AF (paroxysmal atrial fibrillation) (HCC)   COPD with acute exacerbation (HCC)   Acute respiratory failure (HCC)    Body mass index is 31.64 kg/m.  (Obesity)    COPD exacerbation, severe cough: Repeat chest x-ray on 01/12/2021 did not show any acute abnormality.  Continue bronchodilators.  Completed prednisone and antibiotics.   Continue antitussives, incentive spirometer and chest physiotherapy.    Acute hypoxemic respiratory failure: He is on 2 L/min oxygen via nasal cannula.  Taper off oxygen as able.  Probable acute diastolic CHF: Continue IV Lasix.  2D echo in July 2022 showed normal EF, severely dilated left atrium, grade 2 diastolic dysfunction.  Lactic acidosis: Improved.  This was probably from hypoxia.  Atrial fibrillation with RVR: Heart rate has improved.  Continue oral amiodarone and metoprolol  Hypokalemia: Improved  Mildly elevated troponins: This is likely from demand ischemia.  Hypertension: Continue metoprolol  Persistent nausea and vomiting, ileus: Improved.  Start diabetic diet.    AKI: Creatinine is stable  Parkinson's disease: Continue Sinemet  Bilateral lower extremity edema (R>L): Probably due to CHF. No evidence of DVT on  venous duplex.  Rash on the face and right armpit: Bilateral as needed for itching.  Other comorbidities include type II DM, BPH, dyslipidemia, remote use of crack cocaine and tobacco.     Diet Order             Diet Carb Modified Fluid consistency: Thin; Room service appropriate? Yes  Diet effective now                      Consultants: General surgeon Cardiologist  Procedures: None    Medications:    acetylcysteine  4 mL Oral BID   amiodarone  400 mg Oral Daily   apixaban  5 mg Oral BID   atorvastatin  20 mg Oral Daily   carbidopa-levodopa  1 tablet Oral TID AC   escitalopram  5 mg Oral Daily   feeding supplement (GLUCERNA SHAKE)  237 mL Oral BID BM   ferrous sulfate  325 mg Oral TID WC   finasteride  5 mg Oral Daily   fluticasone  1 spray Each Nare Daily   fluticasone furoate-vilanterol  1 puff Inhalation Daily   And   umeclidinium bromide  1 puff Inhalation Daily   furosemide  20 mg Intravenous Daily   gabapentin  800 mg Oral BID   guaiFENesin  600 mg Oral BID   insulin aspart  0-15 Units Subcutaneous TID AC & HS   ipratropium  2 spray Each Nare BID   ipratropium-albuterol  3 mL Nebulization Q4H   lactase  3,000 Units Oral TID WC   methocarbamol  1,000 mg Oral BID   metoprolol  tartrate  100 mg Oral BID   Muscle Rub  1 application Topical TID   oxybutynin  10 mg Oral Daily   pantoprazole  40 mg Oral BID AC   polyethylene glycol  17 g Oral Daily   predniSONE  40 mg Oral Q breakfast   psyllium  1 packet Oral Daily   senna-docusate  1 tablet Oral QHS   traZODone  100 mg Oral QHS   Continuous Infusions:  promethazine (PHENERGAN) injection (IM or IVPB) 200 mL/hr at 01/13/21 0600     Anti-infectives (From admission, onward)    Start     Dose/Rate Route Frequency Ordered Stop   01/12/21 1800  azithromycin (ZITHROMAX) tablet 500 mg        500 mg Oral Daily 01/12/21 1005 01/13/21 1607   01/10/21 1800  azithromycin (ZITHROMAX) 500 mg in sodium  chloride 0.9 % 250 mL IVPB  Status:  Discontinued        500 mg 250 mL/hr over 60 Minutes Intravenous Every 24 hours 01/10/21 0024 01/12/21 1004   01/10/21 1700  cefTRIAXone (ROCEPHIN) 1 g in sodium chloride 0.9 % 100 mL IVPB  Status:  Discontinued        1 g 200 mL/hr over 30 Minutes Intravenous Every 24 hours 01/09/21 2003 01/10/21 0025   01/10/21 1700  cefTRIAXone (ROCEPHIN) 2 g in sodium chloride 0.9 % 100 mL IVPB        2 g 200 mL/hr over 30 Minutes Intravenous Every 24 hours 01/10/21 0025 01/13/21 1639   01/09/21 1730  cefTRIAXone (ROCEPHIN) 2 g in sodium chloride 0.9 % 100 mL IVPB        2 g 200 mL/hr over 30 Minutes Intravenous  Once 01/09/21 1720 01/09/21 1804   01/09/21 1730  azithromycin (ZITHROMAX) 500 mg in sodium chloride 0.9 % 250 mL IVPB        500 mg 250 mL/hr over 60 Minutes Intravenous  Once 01/09/21 1720 01/09/21 2002              Family Communication/Anticipated D/C date and plan/Code Status   DVT prophylaxis:  apixaban (ELIQUIS) tablet 5 mg     Code Status: Full Code  Family Communication: None Disposition Plan:    Status is: Inpatient  Remains inpatient appropriate because:IV treatments appropriate due to intensity of illness or inability to take PO and Inpatient level of care appropriate due to severity of illness  Dispo: The patient is from: Home              Anticipated d/c is to: Home              Patient currently is not medically stable to d/c.   Difficult to place patient No           Subjective:   Interval events noted.  No abdominal pain, nausea or vomiting.  No shortness of breath or chest pain.  Cough is better.  He insists on having solid food.  He has normal Bowel with the last 2 to 3 days but is refusing to have any laxatives.  Objective:    Vitals:   01/13/21 2348 01/14/21 0327 01/14/21 0731 01/14/21 1139  BP: 100/78 110/75 112/71 103/65  Pulse: 74 72 75 66  Resp: 17 19 19 18   Temp: 97.6 F (36.4 C) 98.6 F (37  C) 97.6 F (36.4 C) 97.7 F (36.5 C)  TempSrc:      SpO2: 100% 100% 100% 100%  Weight:  105.8 kg  Height:       No data found.   Intake/Output Summary (Last 24 hours) at 01/14/2021 1206 Last data filed at 01/14/2021 0549 Gross per 24 hour  Intake 176.2 ml  Output 1825 ml  Net -1648.8 ml   Filed Weights   01/12/21 0527 01/13/21 0440 01/14/21 0327  Weight: 105.2 kg 104.2 kg 105.8 kg    Exam:   GEN: NAD SKIN: Warm and dry.  Rash on the face is probably chronic. EYES: EOMI ENT: MMM CV: RRR PULM: CTA B ABD: soft, distended/obese, NT, +BS CNS: AAO x 3, non focal EXT: Right lower extremity edema, left foot edema.  No tenderness or erythema             Data Reviewed:   I have personally reviewed following labs and imaging studies:  Labs: Labs show the following:   Basic Metabolic Panel: Recent Labs  Lab 01/09/21 1547 01/10/21 0640 01/10/21 1416 01/11/21 0707 01/12/21 0440 01/13/21 0858 01/14/21 0547  NA 137   < > 136 141 138 138 138  K 3.9   < > 3.8 4.0 3.1* 3.4* 4.7  CL 97*   < > 94* 100 101 97* 97*  CO2 30   < > 29 30 30 31  33*  GLUCOSE 157*   < > 183* 172* 107* 143* 127*  BUN 28*   < > 29* 27* 25* 23 24*  CREATININE 1.35*   < > 1.31* 1.28* 1.16 1.20 1.03  CALCIUM 8.8*   < > 8.8* 8.9 8.4* 8.8* 8.9  MG 1.9  --   --   --  2.0 2.1 2.1   < > = values in this interval not displayed.   GFR Estimated Creatinine Clearance: 76.7 mL/min (by C-G formula based on SCr of 1.03 mg/dL). Liver Function Tests: Recent Labs  Lab 01/09/21 1547 01/10/21 1416  AST 12* 18  ALT 8 14  ALKPHOS 67 95  BILITOT 1.0 0.7  PROT 6.6 6.4*  ALBUMIN 3.2* 3.0*   No results for input(s): LIPASE, AMYLASE in the last 168 hours. No results for input(s): AMMONIA in the last 168 hours. Coagulation profile No results for input(s): INR, PROTIME in the last 168 hours.  CBC: Recent Labs  Lab 01/09/21 1547 01/10/21 0640 01/10/21 1416 01/11/21 0707  WBC 8.2 5.6 8.9 8.4   NEUTROABS 6.0  --  8.1* 7.0  HGB 12.4* 12.1* 12.7* 11.9*  HCT 36.6* 36.9* 37.3* 35.5*  MCV 92.9 93.7 93.0 93.7  PLT 235 223 273 273   Cardiac Enzymes: No results for input(s): CKTOTAL, CKMB, CKMBINDEX, TROPONINI in the last 168 hours. BNP (last 3 results) No results for input(s): PROBNP in the last 8760 hours. CBG: Recent Labs  Lab 01/13/21 1111 01/13/21 1616 01/13/21 1958 01/14/21 0731 01/14/21 1135  GLUCAP 143* 163* 145* 107* 127*   D-Dimer: No results for input(s): DDIMER in the last 72 hours.  Hgb A1c: No results for input(s): HGBA1C in the last 72 hours.  Lipid Profile: No results for input(s): CHOL, HDL, LDLCALC, TRIG, CHOLHDL, LDLDIRECT in the last 72 hours. Thyroid function studies: No results for input(s): TSH, T4TOTAL, T3FREE, THYROIDAB in the last 72 hours.  Invalid input(s): FREET3  Anemia work up: No results for input(s): VITAMINB12, FOLATE, FERRITIN, TIBC, IRON, RETICCTPCT in the last 72 hours. Sepsis Labs: Recent Labs  Lab 01/09/21 1547 01/09/21 1548 01/09/21 1738 01/10/21 0640 01/10/21 1416 01/10/21 1417 01/11/21 0707  PROCALCITON 0.22  --   --   --   --   --   --  WBC 8.2  --   --  5.6 8.9  --  8.4  LATICACIDVEN  --  2.7* 2.4*  --   --  3.0* 2.2*    Microbiology Recent Results (from the past 240 hour(s))  Blood culture (single)     Status: None   Collection Time: 01/09/21  3:47 PM   Specimen: BLOOD  Result Value Ref Range Status   Specimen Description BLOOD LEFT UPPER ARM  Final   Special Requests   Final    BOTTLES DRAWN AEROBIC AND ANAEROBIC Blood Culture adequate volume   Culture   Final    NO GROWTH 5 DAYS Performed at [facility], [address], [place], [place] [address]    Report Status 01/14/2021 FINAL  Final  Blood culture (single)     Status: None   Collection Time: 01/09/21  3:48 PM   Specimen: BLOOD  Result Value Ref Range Status   Specimen Description BLOOD RIGHT ANTECUBITAL  Final   Special Requests    Final    BOTTLES DRAWN AEROBIC AND ANAEROBIC Blood Culture adequate volume   Culture   Final    NO GROWTH 5 DAYS Performed at [facility], [address] [place], [place] [address]    Report Status 01/14/2021 FINAL  Final  Resp Panel by RT-PCR (Flu A&B, Covid) Nasopharyngeal Swab     Status: None   Collection Time: 01/10/21  2:06 AM   Specimen: Nasopharyngeal Swab; Nasopharyngeal(NP) swabs in vial transport medium  Result Value Ref Range Status   SARS Coronavirus 2 by RT PCR NEGATIVE NEGATIVE Final    Comment: (NOTE) SARS-CoV-2 target nucleic acids are NOT DETECTED.  The SARS-CoV-2 RNA is generally detectable in upper respiratory specimens during the acute phase of infection. The lowest concentration of SARS-CoV-2 viral copies this assay can detect is 138 copies/mL. A negative result does not preclude SARS-Cov-2 infection and should not be used as the sole basis for treatment or other patient management decisions. A negative result may occur with  improper specimen collection/handling, submission of specimen other than nasopharyngeal swab, presence of viral mutation(s) within the areas targeted by this assay, and inadequate number of viral copies(<138 copies/mL). A negative result must be combined with clinical observations, patient history, and epidemiological information. The expected result is Negative.  Fact Sheet for Patients:  BloggerCourse.com  Fact Sheet for Healthcare Providers:  SeriousBroker.it  This test is no t yet approved or cleared by the [place] FDA and  has been authorized for detection and/or diagnosis of SARS-CoV-2 by FDA under an Emergency Use Authorization (EUA). This EUA will remain  in effect (meaning this test can be used) for the duration of the COVID-19 declaration under Section 564(b)(1) of the Act, 21 U.S.C.section 360bbb-3(b)(1), unless the authorization is terminated  or revoked  sooner.       Influenza A by PCR NEGATIVE NEGATIVE Final   Influenza B by PCR NEGATIVE NEGATIVE Final    Comment: (NOTE) The Xpert Xpress SARS-CoV-2/FLU/RSV plus assay is intended as an aid in the diagnosis of influenza from Nasopharyngeal swab specimens and should not be used as a sole basis for treatment. Nasal washings and aspirates are unacceptable for Xpert Xpress SARS-CoV-2/FLU/RSV testing.  Fact Sheet for Patients: BloggerCourse.com  Fact Sheet for Healthcare Providers: SeriousBroker.it  This test is not yet approved or cleared by the [place] FDA and has been authorized for detection and/or diagnosis of SARS-CoV-2 by FDA under an Emergency Use Authorization (EUA). This EUA will  remain in effect (meaning this test can be used) for the duration of the COVID-19 declaration under Section 564(b)(1) of the Act, 21 U.S.C. section 360bbb-3(b)(1), unless the authorization is terminated or revoked.  Performed at [facility], [address] [place], [place] [address]   MRSA Next Gen by PCR, Nasal     Status: Abnormal   Collection Time: 01/12/21 12:00 PM   Specimen: Nasal Mucosa; Nasal Swab  Result Value Ref Range Status   MRSA by PCR Next Gen DETECTED (A) NOT DETECTED Final    Comment: RESULT CALLED TO, READ BACK BY AND VERIFIED WITH: JAMIE EANES 01/12/21 1326 SLM (NOTE) The GeneXpert MRSA Assay (FDA approved for NASAL specimens only), is one component of a comprehensive MRSA colonization surveillance program. It is not intended to diagnose MRSA infection nor to guide or monitor treatment for MRSA infections. Test performance is not FDA approved in patients less than 6 years old. Performed at [facility], [address], [place], [place] [address]     Procedures and diagnostic studies:  DG Chest [facility] 1 View  Result Date: 01/12/2021 CLINICAL DATA:  Cough.  COPD.  Diabetes. EXAM: PORTABLE  CHEST 1 VIEW COMPARISON:  01/09/2021 FINDINGS: Cervical spine fixation. Apical lordotic positioning. Patient rotated left. Cardiomegaly accentuated by AP portable technique. Atherosclerosis in the transverse aorta. No pleural effusion or pneumothorax. Low lung volumes with resultant pulmonary interstitial prominence. No overt congestive failure. Numerous leads and wires project over the chest. No lobar consolidation. IMPRESSION: Cardiomegaly and low lung volumes.  No acute findings. Aortic Atherosclerosis (ICD10-I70.0). Electronically Signed   By: Jeronimo Greaves M.D.   On: 01/12/2021 16:31               LOS: 5 days   Shay Bartoli  [facility]   Pager on [email or web address]. If 7PM-7AM, please contact night-coverage at [email or web address]     01/14/2021, 12:06 PM

## 2021-01-14 NOTE — Plan of Care (Signed)

## 2021-01-15 DIAGNOSIS — J9601 Acute respiratory failure with hypoxia: Secondary | ICD-10-CM | POA: Diagnosis not present

## 2021-01-15 DIAGNOSIS — J441 Chronic obstructive pulmonary disease with (acute) exacerbation: Secondary | ICD-10-CM | POA: Diagnosis not present

## 2021-01-15 DIAGNOSIS — I480 Paroxysmal atrial fibrillation: Secondary | ICD-10-CM | POA: Diagnosis not present

## 2021-01-15 LAB — LACTIC ACID, PLASMA: Lactic Acid, Venous: 2.6 mmol/L (ref 0.5–1.9)

## 2021-01-15 LAB — BASIC METABOLIC PANEL
Anion gap: 5 (ref 5–15)
BUN: 25 mg/dL — ABNORMAL HIGH (ref 8–23)
CO2: 32 mmol/L (ref 22–32)
Calcium: 8.9 mg/dL (ref 8.9–10.3)
Chloride: 100 mmol/L (ref 98–111)
Creatinine, Ser: 1.02 mg/dL (ref 0.61–1.24)
GFR, Estimated: >60 mL/min (ref >60)
Glucose, Bld: 186 mg/dL — ABNORMAL HIGH (ref 70–99)
Potassium: 4.6 mmol/L (ref 3.5–5.1)
Sodium: 137 mmol/L (ref 135–145)

## 2021-01-15 LAB — GLUCOSE, CAPILLARY
Glucose-Capillary: 179 mg/dL — ABNORMAL HIGH (ref 70–99)
Glucose-Capillary: 185 mg/dL — ABNORMAL HIGH (ref 70–99)
Glucose-Capillary: 151 mg/dL — ABNORMAL HIGH (ref 70–99)
Glucose-Capillary: 238 mg/dL — ABNORMAL HIGH (ref 70–99)

## 2021-01-15 LAB — CBC
HCT: 37.6 % — ABNORMAL LOW (ref 39.0–52.0)
Hemoglobin: 12.2 g/dL — ABNORMAL LOW (ref 13.0–17.0)
MCH: 30.4 pg (ref 26.0–34.0)
MCHC: 32.4 g/dL (ref 30.0–36.0)
MCV: 93.8 fL (ref 80.0–100.0)
Platelets: 328 10*3/uL (ref 150–400)
RBC: 4.01 MIL/uL — ABNORMAL LOW (ref 4.22–5.81)
RDW: 19.1 % — ABNORMAL HIGH (ref 11.5–15.5)
WBC: 8.9 10*3/uL (ref 4.0–10.5)
nRBC: 0.6 % — ABNORMAL HIGH (ref 0.0–0.2)

## 2021-01-15 LAB — MAGNESIUM: Magnesium: 2.2 mg/dL (ref 1.7–2.4)

## 2021-01-15 MED ORDER — PREDNISONE 20 MG PO TABS
40.0000 mg | ORAL_TABLET | Freq: Every day | ORAL | Status: AC
  Administered 2021-01-15 – 2021-01-16 (×2): 40 mg via ORAL
  Filled 2021-01-15 (×2): qty 2

## 2021-01-15 MED ORDER — IPRATROPIUM-ALBUTEROL 0.5-2.5 (3) MG/3ML IN SOLN
3.0000 mL | Freq: Three times a day (TID) | RESPIRATORY_TRACT | Status: DC
  Administered 2021-01-15 – 2021-01-17 (×6): 3 mL via RESPIRATORY_TRACT
  Filled 2021-01-15 (×6): qty 3

## 2021-01-15 NOTE — Progress Notes (Signed)
SUBJECTIVE: Raymond Hays is a 75 y.o. Caucasian male with medical history significant for multiple medical problems that are mentioned below, including COPD, type 2 diabetes mellitus, paroxysmal atrial fibrillation, dyslipidemia, osteoarthritis and hypertension, who presented to the emergency room with acute onset of respiratory distress.    Patient sitting in bed eating breakfast. Denies chest pain. Shortness of breath unchanged. Patient complaining of ongoing cough and wheezing, started prior to hospitalization.   Vitals:   01/14/21 1958 01/14/21 2019 01/14/21 2315 01/15/21 0442  BP: (!) 150/118  109/75 108/70  Pulse: 89  90 71  Resp: 18  14 16   Temp: 99.5 F (37.5 C)  98.3 F (36.8 C) 98.1 F (36.7 C)  TempSrc: Oral  Oral Oral  SpO2: 94% 96% 95% 96%  Weight:      Height:        Intake/Output Summary (Last 24 hours) at 01/15/2021 0852 Last data filed at 01/14/2021 2217 Gross per 24 hour  Intake 120 ml  Output 1550 ml  Net -1430 ml    LABS: Basic Metabolic Panel: Recent Labs    01/14/21 0547 01/15/21 0418  NA 138 137  K 4.7 4.6  CL 97* 100  CO2 33* 32  GLUCOSE 127* 186*  BUN 24* 25*  CREATININE 1.03 1.02  CALCIUM 8.9 8.9  MG 2.1 2.2   Liver Function Tests: No results for input(s): AST, ALT, ALKPHOS, BILITOT, PROT, ALBUMIN in the last 72 hours. No results for input(s): LIPASE, AMYLASE in the last 72 hours. CBC: Recent Labs    01/15/21 0418  WBC 8.9  HGB 12.2*  HCT 37.6*  MCV 93.8  PLT 328   Cardiac Enzymes: No results for input(s): CKTOTAL, CKMB, CKMBINDEX, TROPONINI in the last 72 hours. BNP: Invalid input(s): POCBNP D-Dimer: No results for input(s): DDIMER in the last 72 hours. Hemoglobin A1C: No results for input(s): HGBA1C in the last 72 hours. Fasting Lipid Panel: No results for input(s): CHOL, HDL, LDLCALC, TRIG, CHOLHDL, LDLDIRECT in the last 72 hours. Thyroid Function Tests: No results for input(s): TSH, T4TOTAL, T3FREE, THYROIDAB in the last  72 hours.  Invalid input(s): FREET3 Anemia Panel: No results for input(s): VITAMINB12, FOLATE, FERRITIN, TIBC, IRON, RETICCTPCT in the last 72 hours.   PHYSICAL EXAM General: Well developed, well nourished, in no acute distress HEENT:  Normocephalic and atramatic Neck:  No JVD.  Lungs: Clear bilaterally to auscultation and percussion. Heart: HRRR . Normal S1 and S2 without gallops or murmurs.  Abdomen: Bowel sounds are positive, abdomen soft and non-tender  Msk:  Back normal, normal gait. Normal strength and tone for age. Extremities: No clubbing, cyanosis or edema.   Neuro: Alert and oriented X 3. Psych:  Good affect, responds appropriately  TELEMETRY: atrial fibrillation, HR 71 bpm  ASSESSMENT AND PLAN: Patient heart rate improved, still in atrial fibrillation. Continue amiodarone and metoprolol at current dose. Will continue to observe. Patient heart rate stable. May be be discharged from a cardiac standpoint. Follow up in office Thursday 01/18/21 at 9:30 am.  Active Problems:   AF (paroxysmal atrial fibrillation) (HCC)   COPD with acute exacerbation (HCC)   Acute respiratory failure (HCC)    Kedric Bumgarner, FNP-C 01/15/2021 8:52 AM

## 2021-01-15 NOTE — Progress Notes (Signed)
Critical lab value 2.6 lactic acid. Sent message to hospitalist.

## 2021-01-15 NOTE — Progress Notes (Signed)
Patient ID: Raymond Hays, male   DOB: January 09, 1946, 75 y.o.   MRN: [id number]  CTSP for leakage around the SP Tube which is managed at the [place].   His dressing is dry and the urine in the tube is yellow clear.  He reports the leakage has abated.    AP[facility]

## 2021-01-15 NOTE — Progress Notes (Signed)
Progress Note    Raymond Hays  JQG:[id number] DOB: 1945/08/01  DOA: 01/09/2021 PCP: [facility]      Brief Narrative:    Medical records reviewed and are as summarized below:  Raymond Hays is a 75 y.o. male with past medical history significant for COPD, type II DM, paroxysmal atrial fibrillation, dyslipidemia, osteoarthritis, hypertension, obesity, who presented to the hospital because of worsening shortness of breath.  His oxygen saturation was in the mid 80s when EMS picked him up.  He was placed on BiPAP and was transported to the emergency room for further evaluation.  He was admitted to the hospital for acute hypoxemic respiratory failure and COPD exacerbation.    Assessment/Plan:   Active Problems:   AF (paroxysmal atrial fibrillation) (HCC)   COPD with acute exacerbation (HCC)   Acute respiratory failure (HCC)    Body mass index is 31.64 kg/m.  (Obesity)    COPD exacerbation, severe cough: Repeat chest x-ray on 01/12/2021 did not show any acute abnormality. Completed antibiotics.  Restart prednisone.  Continue bronchodilators. Continue antitussives, incentive spirometer and chest physiotherapy.    Acute hypoxemic respiratory failure: Resolved.  He is tolerating room air.  Probable acute diastolic CHF: Continue IV Lasix.  2D echo in July 2022 showed normal EF, severely dilated left atrium, grade 2 diastolic dysfunction.  Lactic acidosis: Improved.  This was probably from hypoxia.  Atrial fibrillation with RVR: Heart rate has improved.  Continue oral amiodarone and metoprolol  Hypokalemia: Improved  Mildly elevated troponins: This is likely from demand ischemia.  Hypertension: Continue metoprolol  Persistent nausea and vomiting, ileus: Improved.  He is tolerating his diet.  AKI: Creatinine is stable  Parkinson's disease: Continue Sinemet  Bilateral lower extremity edema (R>L): Probably due to CHF. No evidence of DVT on venous  duplex.  Suprapubic catheter leaking: He requested to see the urologist.  Dr. Annabell Howells has been consulted.  Rash on the face and right armpit: Bilateral as needed for itching.  Other comorbidities include type II DM, BPH, dyslipidemia, remote use of crack cocaine and tobacco.     Diet Order             Diet Carb Modified Fluid consistency: Thin; Room service appropriate? Yes  Diet effective now                      Consultants: General surgeon Cardiologist  Procedures: None    Medications:    amiodarone  400 mg Oral Daily   apixaban  5 mg Oral BID   atorvastatin  20 mg Oral Daily   carbidopa-levodopa  1 tablet Oral TID AC   escitalopram  5 mg Oral Daily   feeding supplement (GLUCERNA SHAKE)  237 mL Oral BID BM   ferrous sulfate  325 mg Oral TID WC   finasteride  5 mg Oral Daily   fluticasone  1 spray Each Nare Daily   fluticasone furoate-vilanterol  1 puff Inhalation Daily   And   umeclidinium bromide  1 puff Inhalation Daily   furosemide  20 mg Intravenous Daily   gabapentin  800 mg Oral BID   guaiFENesin  600 mg Oral BID   insulin aspart  0-15 Units Subcutaneous TID AC & HS   ipratropium  2 spray Each Nare BID   ipratropium-albuterol  3 mL Nebulization TID   lactase  3,000 Units Oral TID WC   methocarbamol  1,000 mg Oral BID   metoprolol tartrate  100 mg Oral BID   Muscle Rub  1 application Topical TID   oxybutynin  10 mg Oral Daily   pantoprazole  40 mg Oral BID AC   polyethylene glycol  17 g Oral Daily   psyllium  1 packet Oral Daily   senna-docusate  1 tablet Oral QHS   traZODone  100 mg Oral QHS   Continuous Infusions:  promethazine (PHENERGAN) injection (IM or IVPB) 200 mL/hr at 01/13/21 0600     Anti-infectives (From admission, onward)    Start     Dose/Rate Route Frequency Ordered Stop   01/12/21 1800  azithromycin (ZITHROMAX) tablet 500 mg        500 mg Oral Daily 01/12/21 1005 01/13/21 1607   01/10/21 1800  azithromycin (ZITHROMAX)  500 mg in sodium chloride 0.9 % 250 mL IVPB  Status:  Discontinued        500 mg 250 mL/hr over 60 Minutes Intravenous Every 24 hours 01/10/21 0024 01/12/21 1004   01/10/21 1700  cefTRIAXone (ROCEPHIN) 1 g in sodium chloride 0.9 % 100 mL IVPB  Status:  Discontinued        1 g 200 mL/hr over 30 Minutes Intravenous Every 24 hours 01/09/21 2003 01/10/21 0025   01/10/21 1700  cefTRIAXone (ROCEPHIN) 2 g in sodium chloride 0.9 % 100 mL IVPB        2 g 200 mL/hr over 30 Minutes Intravenous Every 24 hours 01/10/21 0025 01/13/21 1639   01/09/21 1730  cefTRIAXone (ROCEPHIN) 2 g in sodium chloride 0.9 % 100 mL IVPB        2 g 200 mL/hr over 30 Minutes Intravenous  Once 01/09/21 1720 01/09/21 1804   01/09/21 1730  azithromycin (ZITHROMAX) 500 mg in sodium chloride 0.9 % 250 mL IVPB        500 mg 250 mL/hr over 60 Minutes Intravenous  Once 01/09/21 1720 01/09/21 2002              Family Communication/Anticipated D/C date and plan/Code Status   DVT prophylaxis:  apixaban (ELIQUIS) tablet 5 mg     Code Status: Full Code  Family Communication: None Disposition Plan:    Status is: Inpatient  Remains inpatient appropriate because:IV treatments appropriate due to intensity of illness or inability to take PO and Inpatient level of care appropriate due to severity of illness  Dispo: The patient is from: Home              Anticipated d/c is to: Home              Patient currently is not medically stable to d/c.   Difficult to place patient No           Subjective:   He complains of cough, congestion and wheezing.  He also said his suprapubic catheter is leaking and requested to see a urologist.  No abdominal pain, nausea or vomiting.  Objective:    Vitals:   01/14/21 2019 01/14/21 2315 01/15/21 0442 01/15/21 0914  BP:  109/75 108/70 (!) 120/91  Pulse:  90 71 79  Resp:  14 16   Temp:  98.3 F (36.8 C) 98.1 F (36.7 C) 98.2 F (36.8 C)  TempSrc:  Oral Oral   SpO2: 96%  95% 96%   Weight:      Height:       No data found.   Intake/Output Summary (Last 24 hours) at 01/15/2021 1539 Last data filed at 01/15/2021 1306 Gross per 24 hour  Intake --  Output 2850 ml  Net -2850 ml   Filed Weights   01/12/21 0527 01/13/21 0440 01/14/21 0327  Weight: 105.2 kg 104.2 kg 105.8 kg    Exam:  GEN: NAD SKIN: Warm and dry EYES: No pallor or icterus ENT: MMM CV: RRR PULM: CTA B ABD: soft, ND, NT, +BS CNS: AAO x 3, non focal EXT: Right lower extremity edema, left foot edema. GU: Suprapubic catheter draining amber urine              Data Reviewed:   I have personally reviewed following labs and imaging studies:  Labs: Labs show the following:   Basic Metabolic Panel: Recent Labs  Lab 01/09/21 1547 01/10/21 0640 01/11/21 0707 01/12/21 0440 01/13/21 0858 01/14/21 0547 01/15/21 0418  NA 137   < > 141 138 138 138 137  K 3.9   < > 4.0 3.1* 3.4* 4.7 4.6  CL 97*   < > 100 101 97* 97* 100  CO2 30   < > 30 30 31  33* 32  GLUCOSE 157*   < > 172* 107* 143* 127* 186*  BUN 28*   < > 27* 25* 23 24* 25*  CREATININE 1.35*   < > 1.28* 1.16 1.20 1.03 1.02  CALCIUM 8.8*   < > 8.9 8.4* 8.8* 8.9 8.9  MG 1.9  --   --  2.0 2.1 2.1 2.2   < > = values in this interval not displayed.   GFR Estimated Creatinine Clearance: 77.5 mL/min (by C-G formula based on SCr of 1.02 mg/dL). Liver Function Tests: Recent Labs  Lab 01/09/21 1547 01/10/21 1416  AST 12* 18  ALT 8 14  ALKPHOS 67 95  BILITOT 1.0 0.7  PROT 6.6 6.4*  ALBUMIN 3.2* 3.0*   No results for input(s): LIPASE, AMYLASE in the last 168 hours. No results for input(s): AMMONIA in the last 168 hours. Coagulation profile No results for input(s): INR, PROTIME in the last 168 hours.  CBC: Recent Labs  Lab 01/09/21 1547 01/10/21 0640 01/10/21 1416 01/11/21 0707 01/15/21 0418  WBC 8.2 5.6 8.9 8.4 8.9  NEUTROABS 6.0  --  8.1* 7.0  --   HGB 12.4* 12.1* 12.7* 11.9* 12.2*  HCT 36.6* 36.9*  37.3* 35.5* 37.6*  MCV 92.9 93.7 93.0 93.7 93.8  PLT 235 223 273 273 328   Cardiac Enzymes: No results for input(s): CKTOTAL, CKMB, CKMBINDEX, TROPONINI in the last 168 hours. BNP (last 3 results) No results for input(s): PROBNP in the last 8760 hours. CBG: Recent Labs  Lab 01/14/21 1135 01/14/21 1618 01/14/21 2029 01/15/21 0906 01/15/21 1159  GLUCAP 127* 201* 223* 179* 185*   D-Dimer: No results for input(s): DDIMER in the last 72 hours.  Hgb A1c: No results for input(s): HGBA1C in the last 72 hours.  Lipid Profile: No results for input(s): CHOL, HDL, LDLCALC, TRIG, CHOLHDL, LDLDIRECT in the last 72 hours. Thyroid function studies: No results for input(s): TSH, T4TOTAL, T3FREE, THYROIDAB in the last 72 hours.  Invalid input(s): FREET3  Anemia work up: No results for input(s): VITAMINB12, FOLATE, FERRITIN, TIBC, IRON, RETICCTPCT in the last 72 hours. Sepsis Labs: Recent Labs  Lab 01/09/21 1547 01/09/21 1548 01/09/21 1738 01/10/21 0640 01/10/21 1416 01/10/21 1417 01/11/21 0707 01/15/21 0418  PROCALCITON 0.22  --   --   --   --   --   --   --   WBC 8.2  --   --  5.6 8.9  --  8.4 8.9  LATICACIDVEN  --    < > 2.4*  --   --  3.0* 2.2* 2.6*   < > = values in this interval not displayed.    Microbiology Recent Results (from the past 240 hour(s))  Blood culture (single)     Status: None   Collection Time: 01/09/21  3:47 PM   Specimen: BLOOD  Result Value Ref Range Status   Specimen Description BLOOD LEFT UPPER ARM  Final   Special Requests   Final    BOTTLES DRAWN AEROBIC AND ANAEROBIC Blood Culture adequate volume   Culture   Final    NO GROWTH 5 DAYS Performed at [facility], [address], [place], [place] [address]    Report Status 01/14/2021 FINAL  Final  Blood culture (single)     Status: None   Collection Time: 01/09/21  3:48 PM   Specimen: BLOOD  Result Value Ref Range Status   Specimen Description BLOOD RIGHT ANTECUBITAL  Final    Special Requests   Final    BOTTLES DRAWN AEROBIC AND ANAEROBIC Blood Culture adequate volume   Culture   Final    NO GROWTH 5 DAYS Performed at [facility], [address] [place], [place] [address]    Report Status 01/14/2021 FINAL  Final  Resp Panel by RT-PCR (Flu A&B, Covid) Nasopharyngeal Swab     Status: None   Collection Time: 01/10/21  2:06 AM   Specimen: Nasopharyngeal Swab; Nasopharyngeal(NP) swabs in vial transport medium  Result Value Ref Range Status   SARS Coronavirus 2 by RT PCR NEGATIVE NEGATIVE Final    Comment: (NOTE) SARS-CoV-2 target nucleic acids are NOT DETECTED.  The SARS-CoV-2 RNA is generally detectable in upper respiratory specimens during the acute phase of infection. The lowest concentration of SARS-CoV-2 viral copies this assay can detect is 138 copies/mL. A negative result does not preclude SARS-Cov-2 infection and should not be used as the sole basis for treatment or other patient management decisions. A negative result may occur with  improper specimen collection/handling, submission of specimen other than nasopharyngeal swab, presence of viral mutation(s) within the areas targeted by this assay, and inadequate number of viral copies(<138 copies/mL). A negative result must be combined with clinical observations, patient history, and epidemiological information. The expected result is Negative.  Fact Sheet for Patients:  BloggerCourse.com  Fact Sheet for Healthcare Providers:  SeriousBroker.it  This test is no t yet approved or cleared by the [place] FDA and  has been authorized for detection and/or diagnosis of SARS-CoV-2 by FDA under an Emergency Use Authorization (EUA). This EUA will remain  in effect (meaning this test can be used) for the duration of the COVID-19 declaration under Section 564(b)(1) of the Act, 21 U.S.C.section 360bbb-3(b)(1), unless the authorization is  terminated  or revoked sooner.       Influenza A by PCR NEGATIVE NEGATIVE Final   Influenza B by PCR NEGATIVE NEGATIVE Final    Comment: (NOTE) The Xpert Xpress SARS-CoV-2/FLU/RSV plus assay is intended as an aid in the diagnosis of influenza from Nasopharyngeal swab specimens and should not be used as a sole basis for treatment. Nasal washings and aspirates are unacceptable for Xpert Xpress SARS-CoV-2/FLU/RSV testing.  Fact Sheet for Patients: BloggerCourse.com  Fact Sheet for Healthcare Providers: SeriousBroker.it  This test is not yet approved or cleared by the [place] FDA and has been authorized for detection and/or diagnosis of SARS-CoV-2 by FDA under an Emergency Use Authorization (EUA).  This EUA will remain in effect (meaning this test can be used) for the duration of the COVID-19 declaration under Section 564(b)(1) of the Act, 21 U.S.C. section 360bbb-3(b)(1), unless the authorization is terminated or revoked.  Performed at [facility], [address] [place], [place] [address]   MRSA Next Gen by PCR, Nasal     Status: Abnormal   Collection Time: 01/12/21 12:00 PM   Specimen: Nasal Mucosa; Nasal Swab  Result Value Ref Range Status   MRSA by PCR Next Gen DETECTED (A) NOT DETECTED Final    Comment: RESULT CALLED TO, READ BACK BY AND VERIFIED WITH: JAMIE EANES 01/12/21 1326 SLM (NOTE) The GeneXpert MRSA Assay (FDA approved for NASAL specimens only), is one component of a comprehensive MRSA colonization surveillance program. It is not intended to diagnose MRSA infection nor to guide or monitor treatment for MRSA infections. Test performance is not FDA approved in patients less than 62 years old. Performed at [facility], [address] [place], [place] [address]     Procedures and diagnostic studies:  No results found.             LOS: 6 days   Porchea Charrier  [facility]   Pager on [email or web address]. If 7PM-7AM, please contact night-coverage at [email or web address]     01/15/2021, 3:39 PM

## 2021-01-15 NOTE — Progress Notes (Signed)
Physical Therapy Treatment Patient Details Name: Raymond Hays MRN: [id number] DOB: September 21, 1945 Today's Date: 01/15/2021   History of Present Illness Raymond Hays is a 75 y.o. male with past medical history significant for COPD, type II DM, paroxysmal atrial fibrillation, dyslipidemia, osteoarthritis, hypertension, obesity, who presented to the hospital because of worsening shortness of breath.  His oxygen saturation was in the mid 80s when EMS picked him up.  He was placed on BiPAP and was transported to the emergency room for further evaluation.     He was admitted to the hospital for acute hypoxemic respiratory failure and COPD exacerbation.    PT Comments    Pt received supine with HOB elevated in bed. Agreeable to PT services. Pt on RA with SPO2 > 95%. With Hob elevated pt still relying on modA for RLE management and torso to reach EOB. Requires mod-MaxA on chuck pad to scoot to EoB for LE's to reach floor to maintain static sitting balance. Initially modA at shoulders to maintain static sitting balance  due to posterolateral lean to L. Once feet supported and BUE support, pt able to maintain static sitting balance with supervision. Seated therex performed for improved core activation and LE strength, MaxA for LE's to return back to bed post there.ex. With bed in trendelenburg and BUE support, pt still relied on minA to scoot up in bed. Pt placed in upright position in bed for pulmonary hygiene with fall precautions in place and all needs in reach. Pt will benefit from STR in order to return to mod-I with bed mobility/static sitting at EOB as pt relies on heavy external support at this moment in time.   Recommendations for follow up therapy are one component of a multi-disciplinary discharge planning process, led by the attending physician.  Recommendations may be updated based on patient status, additional functional criteria and insurance authorization.  Follow Up Recommendations  SNF      Equipment Recommendations       Recommendations for Other Services       Precautions / Restrictions Precautions Precautions: Fall Restrictions Weight Bearing Restrictions: No     Mobility  Bed Mobility Overal bed mobility: Needs Assistance Bed Mobility: Supine to Sit;Sit to Supine     Supine to sit: Mod assist;HOB elevated     General bed mobility comments: Use of bed features and draw sheet in order to mobilize to EOB. Patient Response: Cooperative  Transfers Overall transfer level: Needs assistance               General transfer comment: Dependent with transfers at baseline, not attempted. Trialed lateral scooting to L with ability to perform however minimally moving a few inches to [facility].  Ambulation/Gait             General Gait Details: w/c mobility at baseline   Stairs             Wheelchair Mobility    Modified Rankin (Stroke Patients Only)       Balance Overall balance assessment: Needs assistance Sitting-balance support: Bilateral upper extremity supported;Feet supported Sitting balance-Leahy Scale: Fair Sitting balance - Comments: Initially upon sitting EOb, pt required modA to maintain static sitting with L posterolateral leaning with inability to correct. AFter ~1 min and feet support and BUE support, pt able to maintain static sitting. Postural control: Posterior lean;Left lateral lean     Standing balance comment: Not attempted as pt is dependent at baseline.  Cognition Arousal/Alertness: Awake/alert Behavior During Therapy: WFL for tasks assessed/performed Overall Cognitive Status: Within Functional Limits for tasks assessed                                        Exercises General Exercises - Lower Extremity Ankle Circles/Pumps: AROM;Seated;Both;10 reps Long Arc Quad: Strengthening;Seated;Both;5 reps;AROM Hip Flexion/Marching: AROM;AAROM;Right;Left;5 reps (AAROM for  RLE) Other Exercises Other Exercises: Outside BOS reaching across body during static sitting for core activation (x2/UE); bed mobility.    General Comments General comments (skin integrity, edema, etc.): RLE markedly more swollen compared to LLE.      Pertinent Vitals/Pain Pain Assessment: No/denies pain    Home Living                      Prior Function            PT Goals (current goals can now be found in the care plan section) Acute Rehab PT Goals Patient Stated Goal: improved independence with bed mobility. PT Goal Formulation: With patient Time For Goal Achievement: 01/26/21 Potential to Achieve Goals: Good Progress towards PT goals: Not progressing toward goals - comment (still relying on modA to get to sitting EOB. Relies on UE support to maintain static sitting.)    Frequency    Min 2X/week      PT Plan Current plan remains appropriate    Co-evaluation              AM-PAC PT "6 Clicks" Mobility   Outcome Measure  Help needed turning from your back to your side while in a flat bed without using bedrails?: A Lot Help needed moving from lying on your back to sitting on the side of a flat bed without using bedrails?: A Lot Help needed moving to and from a bed to a chair (including a wheelchair)?: Total Help needed standing up from a chair using your arms (e.g., wheelchair or bedside chair)?: Total Help needed to walk in hospital room?: Total Help needed climbing 3-5 steps with a railing? : Total 6 Click Score: 8    End of Session   Activity Tolerance: Patient tolerated treatment well Patient left: in bed;with call bell/phone within reach Nurse Communication: Mobility status PT Visit Diagnosis: Muscle weakness (generalized) (M62.81)     Time: 1010-1036 PT Time Calculation (min) (ACUTE ONLY): 26 min  Charges:  $Therapeutic Activity: 23-37 mins                     Oktober Glazer M. Fairly IV, PT, DPT Physical Therapist- [place]  [facility]  01/15/2021, 11:50 AM

## 2021-01-16 DIAGNOSIS — J9601 Acute respiratory failure with hypoxia: Secondary | ICD-10-CM | POA: Diagnosis not present

## 2021-01-16 DIAGNOSIS — I480 Paroxysmal atrial fibrillation: Secondary | ICD-10-CM | POA: Diagnosis not present

## 2021-01-16 DIAGNOSIS — J441 Chronic obstructive pulmonary disease with (acute) exacerbation: Secondary | ICD-10-CM | POA: Diagnosis not present

## 2021-01-16 LAB — GLUCOSE, CAPILLARY
Glucose-Capillary: 238 mg/dL — ABNORMAL HIGH (ref 70–99)
Glucose-Capillary: 198 mg/dL — ABNORMAL HIGH (ref 70–99)
Glucose-Capillary: 285 mg/dL — ABNORMAL HIGH (ref 70–99)
Glucose-Capillary: 283 mg/dL — ABNORMAL HIGH (ref 70–99)

## 2021-01-16 NOTE — TOC Progression Note (Signed)
Transition of Care [facility]) - Progression Note    Patient Details  Name: Raymond Hays MRN: [id number] Date of Birth: 1945/09/30  Transition [facility] CM/SW Contact  Margarito Liner, LCSW Phone Number: 01/16/2021, 3:09 PM  Clinical Narrative:  Notified [facility] admissions coordinator of rehab recommendation. Patient has [facility] insurance. She said TOC will not need to start anything at the hospital for that. The facility will have to send notes and do evaluation there for therapy approval.   Expected Discharge Plan: Skilled Nursing Facility Barriers to Discharge: Continued Medical Work up  Expected Discharge Plan and Services Expected Discharge Plan: Skilled Nursing Facility                                               Social Determinants of Health (SDOH) Interventions    Readmission Risk Interventions Readmission Risk Prevention Plan 01/12/2021 10/20/2020  Transportation Screening Complete Complete  PCP or Specialist Appt within 3-5 Days - Complete  HRI or Home Care Consult - Complete  Palliative Care Screening - Not Applicable  Medication Review (RN Care Manager) Complete Referral to Pharmacy  PCP or Specialist appointment within 3-5 days of discharge Complete -  HRI or Home Care Consult Complete -  SW Recovery Care/Counseling Consult Complete -  Palliative Care Screening Not Applicable -  Skilled Nursing Facility Complete -

## 2021-01-16 NOTE — Progress Notes (Signed)
Physical Therapy Treatment Patient Details Name: Raymond Hays MRN: [id number] DOB: Jan 30, 1946 Today's Date: 01/16/2021   History of Present Illness Caden Fukushima is a 75 y.o. male with past medical history significant for COPD, type II DM, paroxysmal atrial fibrillation, dyslipidemia, osteoarthritis, hypertension, obesity, who presented to the hospital because of worsening shortness of breath.  His oxygen saturation was in the mid 80s when EMS picked him up.  He was placed on BiPAP and was transported to the emergency room for further evaluation.     He was admitted to the hospital for acute hypoxemic respiratory failure and COPD exacerbation.    PT Comments    Pt received supine in bed agreeable to PT services. Markedly improved ability to begin mobilizing towards EOB with RLE without external support. Also improving ability to rotate at trunk to bed rail to get to EoB. Pt still however requiring modA at trunk/shoulders to scoot pt to EOB at chuck pad and maintain upright. Once feet flat on floor, pt able to maintain static sitting balance only requiring supervision. Pt tolerated LE/core/ and lateral scoot therex exercises today to improve core activation and UE/LE strength to assist in returning pt to independence with bed mobility. Pt does remain need for maxA for LE's to return to bed and with bed in trendelenburg due to poor shoulder mobility with inability to grab bed rails this session due to pt being too low in bed. Otherwise pt would be able to assist. Pt still requiring assistance with bed mobility and will benefit from STR to return to independent with bed mobility.    Recommendations for follow up therapy are one component of a multi-disciplinary discharge planning process, led by the attending physician.  Recommendations may be updated based on patient status, additional functional criteria and insurance authorization.  Follow Up Recommendations  SNF     Equipment Recommendations        Recommendations for Other Services       Precautions / Restrictions Precautions Precautions: Fall Restrictions Weight Bearing Restrictions: No     Mobility  Bed Mobility Overal bed mobility: Needs Assistance Bed Mobility: Supine to Sit;Sit to Supine     Supine to sit: Mod assist;HOB elevated Sit to supine: Max assist   General bed mobility comments: Use of bed features and draw sheet in order to mobilize to EOB. MaxA for LE's to return to bed and scoot up with bed in trendelenburg. Patient Response: Cooperative  Transfers                 General transfer comment: Dependent with transfers at baseline, not attempted. Trialed lateral scooting to L and R with ability to perform however minimally moving a few inches to [facility] and foot of bed.  Ambulation/Gait             General Gait Details: w/c mobility at baseline   Stairs             Wheelchair Mobility    Modified Rankin (Stroke Patients Only)       Balance Overall balance assessment: Needs assistance Sitting-balance support: Bilateral upper extremity supported;Feet supported Sitting balance-Leahy Scale: Fair Sitting balance - Comments: Initially upon sitting EOb, pt required modA to maintain static sitting with L posterolateral leaning with inability to correct. AFter ~1 min and feet support and BUE support, pt able to maintain static sitting. Postural control: Posterior lean;Left lateral lean     Standing balance comment: Not attempted as pt is dependent at baseline.  Cognition Arousal/Alertness: Awake/alert Behavior During Therapy: WFL for tasks assessed/performed Overall Cognitive Status: Within Functional Limits for tasks assessed                                        Exercises General Exercises - Lower Extremity Long Arc Quad: Strengthening;Seated;Both;AROM;10 reps Hip Flexion/Marching: AROM;AAROM;Right;Left;10 reps (AAROM on RLE  d/t weakness) Toe Raises: AROM;Strengthening;Right;Left;10 reps;Seated (Noted B supination of feet as compensation for weak Ant tib against gravity) Other Exercises Other Exercises: Truncal Rotation R/L for oblique and abdominal acitvation to assist in bed mobility (x10/direction)    General Comments General comments (skin integrity, edema, etc.): Improvement in swelling in RLE      Pertinent Vitals/Pain Pain Assessment: No/denies pain    Home Living                      Prior Function            PT Goals (current goals can now be found in the care plan section) Acute Rehab PT Goals Patient Stated Goal: improved independence with bed mobility. PT Goal Formulation: With patient Time For Goal Achievement: 01/26/21 Potential to Achieve Goals: Good Progress towards PT goals: Progressing toward goals    Frequency    Min 2X/week      PT Plan Current plan remains appropriate    Co-evaluation              AM-PAC PT "6 Clicks" Mobility   Outcome Measure  Help needed turning from your back to your side while in a flat bed without using bedrails?: A Lot Help needed moving from lying on your back to sitting on the side of a flat bed without using bedrails?: A Lot   Help needed standing up from a chair using your arms (e.g., wheelchair or bedside chair)?: Total Help needed to walk in hospital room?: Total Help needed climbing 3-5 steps with a railing? : Total 6 Click Score: 7    End of Session   Activity Tolerance: Patient tolerated treatment well Patient left: in bed;with call bell/phone within reach;with bed alarm set Nurse Communication: Mobility status PT Visit Diagnosis: Muscle weakness (generalized) (M62.81)     Time: 1194-1740 PT Time Calculation (min) (ACUTE ONLY): 22 min  Charges:  $Therapeutic Exercise: 8-22 mins                    Delphia Grates. Fairly IV, PT, DPT Physical Therapist- [place]  [facility]  01/16/2021,  11:09 AM

## 2021-01-16 NOTE — Plan of Care (Signed)
  Problem: Education: Goal: Knowledge of General Education information will improve Description: Including pain rating scale, medication(s)/side effects and non-pharmacologic comfort measures Outcome: Progressing   Problem: Clinical Measurements: Goal: Ability to maintain clinical measurements within normal limits will improve Outcome: Progressing   Problem: Clinical Measurements: Goal: Diagnostic test results will improve Outcome: Progressing   Problem: Clinical Measurements: Goal: Respiratory complications will improve Outcome: Progressing   Problem: Clinical Measurements: Goal: Cardiovascular complication will be avoided Outcome: Progressing   Problem: Nutrition: Goal: Adequate nutrition will be maintained Outcome: Progressing   Problem: Coping: Goal: Level of anxiety will decrease Outcome: Progressing   Problem: Elimination: Goal: Will not experience complications related to bowel motility Outcome: Progressing   Problem: Pain Managment: Goal: General experience of comfort will improve Outcome: Progressing   Problem: Safety: Goal: Ability to remain free from injury will improve Outcome: Progressing   Problem: Skin Integrity: Goal: Risk for impaired skin integrity will decrease Outcome: Progressing

## 2021-01-16 NOTE — Progress Notes (Signed)
Progress Note    Raymond Hays  ZWC:[id number] DOB: Sep 27, 1945  DOA: 01/09/2021 PCP: [facility]      Brief Narrative:    Medical records reviewed and are as summarized below:  Raymond Hays is a 75 y.o. male with past medical history significant for COPD, type II DM, paroxysmal atrial fibrillation, dyslipidemia, osteoarthritis, hypertension, obesity, who presented to the hospital because of worsening shortness of breath.  His oxygen saturation was in the mid 80s when EMS picked him up.  He was placed on BiPAP and was transported to the emergency room for further evaluation.  He was admitted to the hospital for acute hypoxemic respiratory failure and COPD exacerbation.  He was also treated with IV Lasix for suspected acute diastolic CHF.  He developed atrial fibrillation with RVR rapid ventricular response requiring treatment with IV Cardizem infusion followed by IV amiodarone.    Assessment/Plan:   Active Problems:   AF (paroxysmal atrial fibrillation) (HCC)   COPD with acute exacerbation (HCC)   Acute respiratory failure (HCC)    Body mass index is 31.64 kg/m.  (Obesity)    COPD exacerbation, severe cough: Repeat chest x-ray on 01/12/2021 did not show any acute abnormality.  Ordered chest vest every 8 hours to help with cough and congestion Completed antibiotics.  Continue prednisone and bronchodilators. Continue antitussives, incentive spirometer.   Probable acute diastolic CHF: Continue IV Lasix.  2D echo in July 2022 showed normal EF, severely dilated left atrium, grade 2 diastolic dysfunction.  Lactic acidosis: Improved.  This was probably from hypoxia.  Atrial fibrillation with RVR: Heart rate has improved.  Continue oral amiodarone and metoprolol  Mildly elevated troponins: This is likely from demand ischemia.  Hypertension: Continue metoprolol  Persistent nausea and vomiting, ileus: Improved.  He is tolerating his diet.  AKI: Creatinine is  stable  Hypokalemia, acute hypoxemic respiratory failure: Resolved  Bilateral lower extremity edema (R>L): Probably due to CHF. No evidence of DVT on venous duplex.  Suprapubic catheter leaking: It appears suprapubic catheter is no longer leaking.  Outpatient follow-up with his urologist at the [facility] was recommended.  Rash on the face and right armpit: Bilateral as needed for itching.  Other comorbidities include Parkinson's disease, type II DM, BPH, dyslipidemia, remote use of crack cocaine and tobacco.  Possible discharge to SNF in 1 to 2 days   Diet Order             Diet Carb Modified Fluid consistency: Thin; Room service appropriate? Yes  Diet effective now                      Consultants: General surgeon Cardiologist  Procedures: None    Medications:    amiodarone  400 mg Oral Daily   apixaban  5 mg Oral BID   atorvastatin  20 mg Oral Daily   carbidopa-levodopa  1 tablet Oral TID AC   escitalopram  5 mg Oral Daily   feeding supplement (GLUCERNA SHAKE)  237 mL Oral BID BM   ferrous sulfate  325 mg Oral TID WC   finasteride  5 mg Oral Daily   fluticasone  1 spray Each Nare Daily   fluticasone furoate-vilanterol  1 puff Inhalation Daily   And   umeclidinium bromide  1 puff Inhalation Daily   furosemide  20 mg Intravenous Daily   gabapentin  800 mg Oral BID   guaiFENesin  600 mg Oral BID   insulin aspart  0-15  Units Subcutaneous TID AC & HS   ipratropium  2 spray Each Nare BID   ipratropium-albuterol  3 mL Nebulization TID   lactase  3,000 Units Oral TID WC   methocarbamol  1,000 mg Oral BID   metoprolol tartrate  100 mg Oral BID   Muscle Rub  1 application Topical TID   oxybutynin  10 mg Oral Daily   pantoprazole  40 mg Oral BID AC   polyethylene glycol  17 g Oral Daily   psyllium  1 packet Oral Daily   senna-docusate  1 tablet Oral QHS   traZODone  100 mg Oral QHS   Continuous Infusions:  promethazine (PHENERGAN) injection (IM or IVPB) 200  mL/hr at 01/13/21 0600     Anti-infectives (From admission, onward)    Start     Dose/Rate Route Frequency Ordered Stop   01/12/21 1800  azithromycin (ZITHROMAX) tablet 500 mg        500 mg Oral Daily 01/12/21 1005 01/13/21 1607   01/10/21 1800  azithromycin (ZITHROMAX) 500 mg in sodium chloride 0.9 % 250 mL IVPB  Status:  Discontinued        500 mg 250 mL/hr over 60 Minutes Intravenous Every 24 hours 01/10/21 0024 01/12/21 1004   01/10/21 1700  cefTRIAXone (ROCEPHIN) 1 g in sodium chloride 0.9 % 100 mL IVPB  Status:  Discontinued        1 g 200 mL/hr over 30 Minutes Intravenous Every 24 hours 01/09/21 2003 01/10/21 0025   01/10/21 1700  cefTRIAXone (ROCEPHIN) 2 g in sodium chloride 0.9 % 100 mL IVPB        2 g 200 mL/hr over 30 Minutes Intravenous Every 24 hours 01/10/21 0025 01/13/21 1639   01/09/21 1730  cefTRIAXone (ROCEPHIN) 2 g in sodium chloride 0.9 % 100 mL IVPB        2 g 200 mL/hr over 30 Minutes Intravenous  Once 01/09/21 1720 01/09/21 1804   01/09/21 1730  azithromycin (ZITHROMAX) 500 mg in sodium chloride 0.9 % 250 mL IVPB        500 mg 250 mL/hr over 60 Minutes Intravenous  Once 01/09/21 1720 01/09/21 2002              Family Communication/Anticipated D/C date and plan/Code Status   DVT prophylaxis:  apixaban (ELIQUIS) tablet 5 mg     Code Status: Full Code  Family Communication: None Disposition Plan:    Status is: Inpatient  Remains inpatient appropriate because:IV treatments appropriate due to intensity of illness or inability to take PO and Inpatient level of care appropriate due to severity of illness  Dispo: The patient is from: Home              Anticipated d/c is to: Home              Patient currently is not medically stable to d/c.   Difficult to place patient No           Subjective:   Interval events noted.  He still complains of cough, congestion and wheezing.  Objective:    Vitals:   01/15/21 2225 01/16/21 0757  01/16/21 0846 01/16/21 1301  BP: 124/82 121/74    Pulse: 60 77    Resp:  16    Temp:  (!) 97.4 F (36.3 C)    TempSrc:  Oral    SpO2: 94% 95% 94% 95%  Weight:      Height:       No  data found.   Intake/Output Summary (Last 24 hours) at 01/16/2021 1452 Last data filed at 01/16/2021 1100 Gross per 24 hour  Intake --  Output 1100 ml  Net -1100 ml   Filed Weights   01/12/21 0527 01/13/21 0440 01/14/21 0327  Weight: 105.2 kg 104.2 kg 105.8 kg    Exam:  GEN: NAD SKIN: Warm and dry EYES: No pallor or icterus ENT: MMM CV: RRR PULM: CTA B ABD: soft, ND, NT, +BS CNS: AAO x 3, non focal EXT: Bilateral leg and pedal edema.  No erythema or tenderness GU: Suprapubic pubic catheter draining amber urine              Data Reviewed:   I have personally reviewed following labs and imaging studies:  Labs: Labs show the following:   Basic Metabolic Panel: Recent Labs  Lab 01/09/21 1547 01/10/21 0640 01/11/21 0707 01/12/21 0440 01/13/21 0858 01/14/21 0547 01/15/21 0418  NA 137   < > 141 138 138 138 137  K 3.9   < > 4.0 3.1* 3.4* 4.7 4.6  CL 97*   < > 100 101 97* 97* 100  CO2 30   < > 30 30 31  33* 32  GLUCOSE 157*   < > 172* 107* 143* 127* 186*  BUN 28*   < > 27* 25* 23 24* 25*  CREATININE 1.35*   < > 1.28* 1.16 1.20 1.03 1.02  CALCIUM 8.8*   < > 8.9 8.4* 8.8* 8.9 8.9  MG 1.9  --   --  2.0 2.1 2.1 2.2   < > = values in this interval not displayed.   GFR Estimated Creatinine Clearance: 77.5 mL/min (by C-G formula based on SCr of 1.02 mg/dL). Liver Function Tests: Recent Labs  Lab 01/09/21 1547 01/10/21 1416  AST 12* 18  ALT 8 14  ALKPHOS 67 95  BILITOT 1.0 0.7  PROT 6.6 6.4*  ALBUMIN 3.2* 3.0*   No results for input(s): LIPASE, AMYLASE in the last 168 hours. No results for input(s): AMMONIA in the last 168 hours. Coagulation profile No results for input(s): INR, PROTIME in the last 168 hours.  CBC: Recent Labs  Lab 01/09/21 1547  01/10/21 0640 01/10/21 1416 01/11/21 0707 01/15/21 0418  WBC 8.2 5.6 8.9 8.4 8.9  NEUTROABS 6.0  --  8.1* 7.0  --   HGB 12.4* 12.1* 12.7* 11.9* 12.2*  HCT 36.6* 36.9* 37.3* 35.5* 37.6*  MCV 92.9 93.7 93.0 93.7 93.8  PLT 235 223 273 273 328   Cardiac Enzymes: No results for input(s): CKTOTAL, CKMB, CKMBINDEX, TROPONINI in the last 168 hours. BNP (last 3 results) No results for input(s): PROBNP in the last 8760 hours. CBG: Recent Labs  Lab 01/15/21 1159 01/15/21 1617 01/15/21 2051 01/16/21 0755 01/16/21 1112  GLUCAP 185* 151* 238* 238* 198*   D-Dimer: No results for input(s): DDIMER in the last 72 hours.  Hgb A1c: No results for input(s): HGBA1C in the last 72 hours.  Lipid Profile: No results for input(s): CHOL, HDL, LDLCALC, TRIG, CHOLHDL, LDLDIRECT in the last 72 hours. Thyroid function studies: No results for input(s): TSH, T4TOTAL, T3FREE, THYROIDAB in the last 72 hours.  Invalid input(s): FREET3  Anemia work up: No results for input(s): VITAMINB12, FOLATE, FERRITIN, TIBC, IRON, RETICCTPCT in the last 72 hours. Sepsis Labs: Recent Labs  Lab 01/09/21 1547 01/09/21 1548 01/09/21 1738 01/10/21 0640 01/10/21 1416 01/10/21 1417 01/11/21 0707 01/15/21 0418  PROCALCITON 0.22  --   --   --   --   --   --   --  WBC 8.2  --   --  5.6 8.9  --  8.4 8.9  LATICACIDVEN  --    < > 2.4*  --   --  3.0* 2.2* 2.6*   < > = values in this interval not displayed.    Microbiology Recent Results (from the past 240 hour(s))  Blood culture (single)     Status: None   Collection Time: 01/09/21  3:47 PM   Specimen: BLOOD  Result Value Ref Range Status   Specimen Description BLOOD LEFT UPPER ARM  Final   Special Requests   Final    BOTTLES DRAWN AEROBIC AND ANAEROBIC Blood Culture adequate volume   Culture   Final    NO GROWTH 5 DAYS Performed at [facility], [address] [place], [place] [address]    Report Status 01/14/2021 FINAL  Final  Blood culture  (single)     Status: None   Collection Time: 01/09/21  3:48 PM   Specimen: BLOOD  Result Value Ref Range Status   Specimen Description BLOOD RIGHT ANTECUBITAL  Final   Special Requests   Final    BOTTLES DRAWN AEROBIC AND ANAEROBIC Blood Culture adequate volume   Culture   Final    NO GROWTH 5 DAYS Performed at [facility], [address] [place], [place] [address]    Report Status 01/14/2021 FINAL  Final  Resp Panel by RT-PCR (Flu A&B, Covid) Nasopharyngeal Swab     Status: None   Collection Time: 01/10/21  2:06 AM   Specimen: Nasopharyngeal Swab; Nasopharyngeal(NP) swabs in vial transport medium  Result Value Ref Range Status   SARS Coronavirus 2 by RT PCR NEGATIVE NEGATIVE Final    Comment: (NOTE) SARS-CoV-2 target nucleic acids are NOT DETECTED.  The SARS-CoV-2 RNA is generally detectable in upper respiratory specimens during the acute phase of infection. The lowest concentration of SARS-CoV-2 viral copies this assay can detect is 138 copies/mL. A negative result does not preclude SARS-Cov-2 infection and should not be used as the sole basis for treatment or other patient management decisions. A negative result may occur with  improper specimen collection/handling, submission of specimen other than nasopharyngeal swab, presence of viral mutation(s) within the areas targeted by this assay, and inadequate number of viral copies(<138 copies/mL). A negative result must be combined with clinical observations, patient history, and epidemiological information. The expected result is Negative.  Fact Sheet for Patients:  BloggerCourse.com  Fact Sheet for Healthcare Providers:  SeriousBroker.it  This test is no t yet approved or cleared by the [place] FDA and  has been authorized for detection and/or diagnosis of SARS-CoV-2 by FDA under an Emergency Use Authorization (EUA). This EUA will remain  in effect (meaning  this test can be used) for the duration of the COVID-19 declaration under Section 564(b)(1) of the Act, 21 U.S.C.section 360bbb-3(b)(1), unless the authorization is terminated  or revoked sooner.       Influenza A by PCR NEGATIVE NEGATIVE Final   Influenza B by PCR NEGATIVE NEGATIVE Final    Comment: (NOTE) The Xpert Xpress SARS-CoV-2/FLU/RSV plus assay is intended as an aid in the diagnosis of influenza from Nasopharyngeal swab specimens and should not be used as a sole basis for treatment. Nasal washings and aspirates are unacceptable for Xpert Xpress SARS-CoV-2/FLU/RSV testing.  Fact Sheet for Patients: BloggerCourse.com  Fact Sheet for Healthcare Providers: SeriousBroker.it  This test is not yet approved or cleared by the [place] and has been authorized for  detection and/or diagnosis of SARS-CoV-2 by FDA under an Emergency Use Authorization (EUA). This EUA will remain in effect (meaning this test can be used) for the duration of the COVID-19 declaration under Section 564(b)(1) of the Act, 21 U.S.C. section 360bbb-3(b)(1), unless the authorization is terminated or revoked.  Performed at [facility], [address] [place], [place] [address]   MRSA Next Gen by PCR, Nasal     Status: Abnormal   Collection Time: 01/12/21 12:00 PM   Specimen: Nasal Mucosa; Nasal Swab  Result Value Ref Range Status   MRSA by PCR Next Gen DETECTED (A) NOT DETECTED Final    Comment: RESULT CALLED TO, READ BACK BY AND VERIFIED WITH: JAMIE EANES 01/12/21 1326 SLM (NOTE) The GeneXpert MRSA Assay (FDA approved for NASAL specimens only), is one component of a comprehensive MRSA colonization surveillance program. It is not intended to diagnose MRSA infection nor to guide or monitor treatment for MRSA infections. Test performance is not FDA approved in patients less than 31 years old. Performed at [facility], [address] [place], [place] [address]     Procedures and diagnostic studies:  No results found.             LOS: 7 days   Florentino Laabs  [facility]   Pager on [email or web address]. If 7PM-7AM, please contact night-coverage at [email or web address]     01/16/2021, 2:52 PM

## 2021-01-17 DIAGNOSIS — J9601 Acute respiratory failure with hypoxia: Secondary | ICD-10-CM | POA: Diagnosis not present

## 2021-01-17 DIAGNOSIS — I480 Paroxysmal atrial fibrillation: Secondary | ICD-10-CM | POA: Diagnosis not present

## 2021-01-17 DIAGNOSIS — J441 Chronic obstructive pulmonary disease with (acute) exacerbation: Secondary | ICD-10-CM | POA: Diagnosis not present

## 2021-01-17 LAB — GLUCOSE, CAPILLARY
Glucose-Capillary: 195 mg/dL — ABNORMAL HIGH (ref 70–99)
Glucose-Capillary: 187 mg/dL — ABNORMAL HIGH (ref 70–99)
Glucose-Capillary: 281 mg/dL — ABNORMAL HIGH (ref 70–99)
Glucose-Capillary: 252 mg/dL — ABNORMAL HIGH (ref 70–99)

## 2021-01-17 LAB — BASIC METABOLIC PANEL
Anion gap: 7 (ref 5–15)
BUN: 28 mg/dL — ABNORMAL HIGH (ref 8–23)
CO2: 27 mmol/L (ref 22–32)
Calcium: 8.7 mg/dL — ABNORMAL LOW (ref 8.9–10.3)
Chloride: 103 mmol/L (ref 98–111)
Creatinine, Ser: 1.03 mg/dL (ref 0.61–1.24)
GFR, Estimated: >60 mL/min (ref >60)
Glucose, Bld: 255 mg/dL — ABNORMAL HIGH (ref 70–99)
Potassium: 4.1 mmol/L (ref 3.5–5.1)
Sodium: 137 mmol/L (ref 135–145)

## 2021-01-17 MED ORDER — INSULIN GLARGINE-YFGN 100 UNIT/ML ~~LOC~~ SOLN
10.0000 [IU] | Freq: Every day | SUBCUTANEOUS | Status: DC
  Administered 2021-01-17 – 2021-01-18 (×2): 10 [IU] via SUBCUTANEOUS
  Filled 2021-01-17 (×3): qty 0.1

## 2021-01-17 MED ORDER — IPRATROPIUM-ALBUTEROL 0.5-2.5 (3) MG/3ML IN SOLN
3.0000 mL | RESPIRATORY_TRACT | Status: DC
  Administered 2021-01-17 – 2021-01-19 (×10): 3 mL via RESPIRATORY_TRACT
  Filled 2021-01-17 (×9): qty 3

## 2021-01-17 MED ORDER — METHYLPREDNISOLONE SODIUM SUCC 40 MG IJ SOLR
40.0000 mg | Freq: Two times a day (BID) | INTRAMUSCULAR | Status: DC
  Administered 2021-01-17 – 2021-01-18 (×3): 40 mg via INTRAVENOUS
  Filled 2021-01-17 (×3): qty 1

## 2021-01-17 MED ORDER — ARFORMOTEROL TARTRATE 15 MCG/2ML IN NEBU
15.0000 ug | INHALATION_SOLUTION | Freq: Two times a day (BID) | RESPIRATORY_TRACT | Status: DC
  Administered 2021-01-17 – 2021-01-22 (×10): 15 ug via RESPIRATORY_TRACT
  Filled 2021-01-17 (×13): qty 2

## 2021-01-17 MED ORDER — BUDESONIDE 0.25 MG/2ML IN SUSP
0.2500 mg | Freq: Two times a day (BID) | RESPIRATORY_TRACT | Status: DC
  Administered 2021-01-17 – 2021-01-22 (×11): 0.25 mg via RESPIRATORY_TRACT
  Filled 2021-01-17 (×10): qty 2

## 2021-01-17 MED ORDER — TORSEMIDE 10 MG PO TABS
10.0000 mg | ORAL_TABLET | Freq: Every day | ORAL | Status: DC
  Administered 2021-01-18 – 2021-01-22 (×5): 10 mg via ORAL
  Filled 2021-01-17 (×6): qty 1

## 2021-01-17 MED ORDER — INSULIN ASPART 100 UNIT/ML IJ SOLN
3.0000 [IU] | Freq: Three times a day (TID) | INTRAMUSCULAR | Status: DC
  Administered 2021-01-17 – 2021-01-22 (×16): 3 [IU] via SUBCUTANEOUS
  Filled 2021-01-17 (×16): qty 1

## 2021-01-17 NOTE — Progress Notes (Signed)
Inpatient Diabetes Program Recommendations  AACE/ADA: New Consensus Statement on Inpatient Glycemic Control (2015)  Target Ranges:  Prepandial:   less than 140 mg/dL      Peak postprandial:   less than 180 mg/dL (1-2 hours)      Critically ill patients:  140 - 180 mg/dL   Lab Results  Component Value Date   GLUCAP 195 (H) 01/17/2021   HGBA1C 6.7 (H) 01/10/2021    Review of Glycemic Control Results for Raymond Hays, Raymond Hays (MRN [id number]) as of 01/17/2021 10:54  Ref. Range 01/16/2021 07:55 01/16/2021 11:12 01/16/2021 16:12 01/16/2021 21:49 01/17/2021 07:40  Glucose-Capillary Latest Ref Range: 70 - 99 mg/dL 536 (H) 144 (H) 315 (H) 283 (H) 195 (H)   Diabetes history: DM 2 Outpatient Diabetes medications: Metformin 500 mg Daily Current orders for Inpatient glycemic control:  Novolog 0-15 units 4x/day  Inpatient Diabetes Program Recommendations:    Note: Starting Solumedrol 40 mg Q12 this am, in anticipation of glucose trends increasing....  -  Consider adding Semglee 10 units -  Add Novolog 3 units tid meal coverage tid if eating >50% of meals  Thanks,  Christena Deem RN, MSN, BC-ADM Inpatient Diabetes Coordinator Team Pager [phone] (8a-5p)

## 2021-01-17 NOTE — Progress Notes (Signed)
PROGRESS NOTE    Raymond Hays  QPR:[id number] DOB: [birth year] DOA: 01/09/2021 PCP: [facility]    Brief Narrative:  75 y.o. male with past medical history significant for COPD, type II DM, paroxysmal atrial fibrillation, dyslipidemia, osteoarthritis, hypertension, obesity, who presented to the hospital because of worsening shortness of breath.  His oxygen saturation was in the mid 80s when EMS picked him up.  He was placed on BiPAP and was transported to the emergency room for further evaluation.   He was admitted to the hospital for acute hypoxemic respiratory failure and COPD exacerbation.  He was also treated with IV Lasix for suspected acute diastolic CHF.  He developed atrial fibrillation with RVR rapid ventricular response requiring treatment with IV Cardizem infusion followed by IV amiodarone.  Heart rate is improved.  Patient remains on metoprolol and p.o. Cardizem.  Main barrier to discharge is respiratory issues.  Patient with diffusely rhonchorous breath sounds and shortness of breath.   Assessment & Plan:   Active Problems:   AF (paroxysmal atrial fibrillation) (HCC)   COPD with acute exacerbation (HCC)   Acute respiratory failure (HCC)  Acute exacerbation COPD No evidence of infiltrate on x-ray Improving with chest PT and nebulizer Plan: DuoNebs every 4 hours scheduled Twice daily Brovana Twice daily Pulmicort Chest PT every 8 hours Empiric IV Solu-Medrol Antitussives I-S use  Acute on chronic diastolic congestive heart failure Mild exacerbation Plan: DC IV Lasix after today's dose Restart p.o. torsemide 10 mg daily from tomorrow Outpatient cardiology follow-up  Atrial fibrillation with rapid ventricular response Heart rate improved Continue oral amiodarone and metoprolol Continue Eliquis for VT prophylaxis Outpatient cardiology follow-up  Elevated troponin ACS ruled out  Hypertension Continue metoprolol  Intractable nausea and  vomiting Resolved  AKI Resolved  Leaking supra pubic catheter Seen by urology Leaking stopped Outpatient urology follow-up    DVT prophylaxis: Apixaban Code Status: Full Family Communication: None today Disposition Plan: Status is: Inpatient  Remains inpatient appropriate because: Not yet at discharge goal.  Still with rhonchorous breath sounds consistent with COPD exacerbation.  Tentative plan to discharge in 24 hours.       Level of care: Med-Surg  Consultants:  None  Procedures:  None  Antimicrobials: None   Subjective: Seen and examined.  Reports improvement in shortness of breath.  Not yet at baseline.  Objective: Vitals:   01/17/21 0928 01/17/21 0934 01/17/21 1000 01/17/21 1124  BP:   121/77   Pulse:      Resp:      Temp:      TempSrc:      SpO2: 95% 95%  95%  Weight:      Height:        Intake/Output Summary (Last 24 hours) at 01/17/2021 1331 Last data filed at 01/17/2021 1056 Gross per 24 hour  Intake 750 ml  Output 1400 ml  Net -650 ml   Filed Weights   01/12/21 0527 01/13/21 0440 01/14/21 0327  Weight: 105.2 kg 104.2 kg 105.8 kg    Examination:  General exam: No acute distress Respiratory system: Diffusely rhonchorous.  End expiratory wheeze.  Normal work of breathing.  Room air Cardiovascular system: S1-S2, regular rate, irregular rhythm, no murmurs, no pedal edema Gastrointestinal system: Soft, NT/ND, normal bowel sounds Central nervous system: Alert and oriented. No focal neurological deficits. Extremities: Symmetric 5 x 5 power. Skin: No rashes, lesions or ulcers Psychiatry: Judgement and insight appear normal. Mood & affect appropriate.     Data Reviewed: I  have personally reviewed following labs and imaging studies  CBC: Recent Labs  Lab 01/10/21 1416 01/11/21 0707 01/15/21 0418  WBC 8.9 8.4 8.9  NEUTROABS 8.1* 7.0  --   HGB 12.7* 11.9* 12.2*  HCT 37.3* 35.5* 37.6*  MCV 93.0 93.7 93.8  PLT 273 273 328   Basic  Metabolic Panel: Recent Labs  Lab 01/12/21 0440 01/13/21 0858 01/14/21 0547 01/15/21 0418 01/17/21 0311  NA 138 138 138 137 137  K 3.1* 3.4* 4.7 4.6 4.1  CL 101 97* 97* 100 103  CO2 30 31 33* 32 27  GLUCOSE 107* 143* 127* 186* 255*  BUN 25* 23 24* 25* 28*  CREATININE 1.16 1.20 1.03 1.02 1.03  CALCIUM 8.4* 8.8* 8.9 8.9 8.7*  MG 2.0 2.1 2.1 2.2  --    GFR: Estimated Creatinine Clearance: 76.7 mL/min (by C-G formula based on SCr of 1.03 mg/dL). Liver Function Tests: Recent Labs  Lab 01/10/21 1416  AST 18  ALT 14  ALKPHOS 95  BILITOT 0.7  PROT 6.4*  ALBUMIN 3.0*   No results for input(s): LIPASE, AMYLASE in the last 168 hours. No results for input(s): AMMONIA in the last 168 hours. Coagulation Profile: No results for input(s): INR, PROTIME in the last 168 hours. Cardiac Enzymes: No results for input(s): CKTOTAL, CKMB, CKMBINDEX, TROPONINI in the last 168 hours. BNP (last 3 results) No results for input(s): PROBNP in the last 8760 hours. HbA1C: No results for input(s): HGBA1C in the last 72 hours. CBG: Recent Labs  Lab 01/16/21 1112 01/16/21 1612 01/16/21 2149 01/17/21 0740 01/17/21 1154  GLUCAP 198* 285* 283* 195* 187*   Lipid Profile: No results for input(s): CHOL, HDL, LDLCALC, TRIG, CHOLHDL, LDLDIRECT in the last 72 hours. Thyroid Function Tests: No results for input(s): TSH, T4TOTAL, FREET4, T3FREE, THYROIDAB in the last 72 hours. Anemia Panel: No results for input(s): VITAMINB12, FOLATE, FERRITIN, TIBC, IRON, RETICCTPCT in the last 72 hours. Sepsis Labs: Recent Labs  Lab 01/10/21 1417 01/11/21 0707 01/15/21 0418  LATICACIDVEN 3.0* 2.2* 2.6*    Recent Results (from the past 240 hour(s))  Blood culture (single)     Status: None   Collection Time: 01/09/21  3:47 PM   Specimen: BLOOD  Result Value Ref Range Status   Specimen Description BLOOD LEFT UPPER ARM  Final   Special Requests   Final    BOTTLES DRAWN AEROBIC AND ANAEROBIC Blood Culture  adequate volume   Culture   Final    NO GROWTH 5 DAYS Performed at [facility], [address], [place], [place] [address]    Report Status 01/14/2021 FINAL  Final  Blood culture (single)     Status: None   Collection Time: 01/09/21  3:48 PM   Specimen: BLOOD  Result Value Ref Range Status   Specimen Description BLOOD RIGHT ANTECUBITAL  Final   Special Requests   Final    BOTTLES DRAWN AEROBIC AND ANAEROBIC Blood Culture adequate volume   Culture   Final    NO GROWTH 5 DAYS Performed at [facility], [address] [place], [place] [address]    Report Status 01/14/2021 FINAL  Final  Resp Panel by RT-PCR (Flu A&B, Covid) Nasopharyngeal Swab     Status: None   Collection Time: 01/10/21  2:06 AM   Specimen: Nasopharyngeal Swab; Nasopharyngeal(NP) swabs in vial transport medium  Result Value Ref Range Status   SARS Coronavirus 2 by RT PCR NEGATIVE NEGATIVE Final    Comment: (NOTE) SARS-CoV-2 target  nucleic acids are NOT DETECTED.  The SARS-CoV-2 RNA is generally detectable in upper respiratory specimens during the acute phase of infection. The lowest concentration of SARS-CoV-2 viral copies this assay can detect is 138 copies/mL. A negative result does not preclude SARS-Cov-2 infection and should not be used as the sole basis for treatment or other patient management decisions. A negative result may occur with  improper specimen collection/handling, submission of specimen other than nasopharyngeal swab, presence of viral mutation(s) within the areas targeted by this assay, and inadequate number of viral copies(<138 copies/mL). A negative result must be combined with clinical observations, patient history, and epidemiological information. The expected result is Negative.  Fact Sheet for Patients:  BloggerCourse.com  Fact Sheet for Healthcare Providers:  SeriousBroker.it  This test is no t yet approved or  cleared by the [place] FDA and  has been authorized for detection and/or diagnosis of SARS-CoV-2 by FDA under an Emergency Use Authorization (EUA). This EUA will remain  in effect (meaning this test can be used) for the duration of the COVID-19 declaration under Section 564(b)(1) of the Act, 21 U.S.C.section 360bbb-3(b)(1), unless the authorization is terminated  or revoked sooner.       Influenza A by PCR NEGATIVE NEGATIVE Final   Influenza B by PCR NEGATIVE NEGATIVE Final    Comment: (NOTE) The Xpert Xpress SARS-CoV-2/FLU/RSV plus assay is intended as an aid in the diagnosis of influenza from Nasopharyngeal swab specimens and should not be used as a sole basis for treatment. Nasal washings and aspirates are unacceptable for Xpert Xpress SARS-CoV-2/FLU/RSV testing.  Fact Sheet for Patients: BloggerCourse.com  Fact Sheet for Healthcare Providers: SeriousBroker.it  This test is not yet approved or cleared by the [place] FDA and has been authorized for detection and/or diagnosis of SARS-CoV-2 by FDA under an Emergency Use Authorization (EUA). This EUA will remain in effect (meaning this test can be used) for the duration of the COVID-19 declaration under Section 564(b)(1) of the Act, 21 U.S.C. section 360bbb-3(b)(1), unless the authorization is terminated or revoked.  Performed at [facility], [address] [place], [place] [address]   MRSA Next Gen by PCR, Nasal     Status: Abnormal   Collection Time: 01/12/21 12:00 PM   Specimen: Nasal Mucosa; Nasal Swab  Result Value Ref Range Status   MRSA by PCR Next Gen DETECTED (A) NOT DETECTED Final    Comment: RESULT CALLED TO, READ BACK BY AND VERIFIED WITH: JAMIE EANES 01/12/21 1326 SLM (NOTE) The GeneXpert MRSA Assay (FDA approved for NASAL specimens only), is one component of a comprehensive MRSA colonization surveillance program. It is not intended to  diagnose MRSA infection nor to guide or monitor treatment for MRSA infections. Test performance is not FDA approved in patients less than 66 years old. Performed at [facility], [address] [place], [place] [address]          Radiology Studies: No results found.      Scheduled Meds:  amiodarone  400 mg Oral Daily   apixaban  5 mg Oral BID   arformoterol  15 mcg Nebulization BID   atorvastatin  20 mg Oral Daily   budesonide (PULMICORT) nebulizer solution  0.25 mg Nebulization BID   carbidopa-levodopa  1 tablet Oral TID AC   escitalopram  5 mg Oral Daily   feeding supplement (GLUCERNA SHAKE)  237 mL Oral BID BM   ferrous sulfate  325 mg Oral TID WC   finasteride  5  mg Oral Daily   fluticasone  1 spray Each Nare Daily   furosemide  20 mg Intravenous Daily   gabapentin  800 mg Oral BID   guaiFENesin  600 mg Oral BID   insulin aspart  0-15 Units Subcutaneous TID AC & HS   insulin aspart  3 Units Subcutaneous TID WC   insulin glargine-yfgn  10 Units Subcutaneous Daily   ipratropium  2 spray Each Nare BID   ipratropium-albuterol  3 mL Nebulization Q4H   lactase  3,000 Units Oral TID WC   methocarbamol  1,000 mg Oral BID   methylPREDNISolone (SOLU-MEDROL) injection  40 mg Intravenous Q12H   metoprolol tartrate  100 mg Oral BID   Muscle Rub  1 application Topical TID   oxybutynin  10 mg Oral Daily   pantoprazole  40 mg Oral BID AC   polyethylene glycol  17 g Oral Daily   psyllium  1 packet Oral Daily   senna-docusate  1 tablet Oral QHS   traZODone  100 mg Oral QHS   Continuous Infusions:  promethazine (PHENERGAN) injection (IM or IVPB) 200 mL/hr at 01/13/21 0600     LOS: 8 days    Time spent: 35 minutes    Tresa Moore, MD [facility]   If 7PM-7AM, please contact night-coverage  01/17/2021, 1:31 PM

## 2021-01-17 NOTE — Progress Notes (Signed)
SUBJECTIVE: Raymond Hays is a 75 y.o. Caucasian male with medical history significant for multiple medical problems that are mentioned below, including COPD, type 2 diabetes mellitus, paroxysmal atrial fibrillation, dyslipidemia, osteoarthritis and hypertension, who presented to the emergency room with acute onset of respiratory distress.    Patient sitting in bed eating breakfast. Denies chest pain. Shortness of breath unchanged. Patient complaining of ongoing cough and wheezing, improved.   Vitals:   01/16/21 2009 01/16/21 2130 01/17/21 0500 01/17/21 0741  BP: 123/87  109/82 (!) 151/126  Pulse: 86  94 86  Resp: (!) 24  18 19   Temp: 97.8 F (36.6 C)  98.5 F (36.9 C) 97.6 F (36.4 C)  TempSrc: Oral  Oral Oral  SpO2: 96% 97% 94% 97%  Weight:      Height:        Intake/Output Summary (Last 24 hours) at 01/17/2021 0825 Last data filed at 01/17/2021 0559 Gross per 24 hour  Intake 270 ml  Output 1400 ml  Net -1130 ml    LABS: Basic Metabolic Panel: Recent Labs    01/15/21 0418 01/17/21 0311  NA 137 137  K 4.6 4.1  CL 100 103  CO2 32 27  GLUCOSE 186* 255*  BUN 25* 28*  CREATININE 1.02 1.03  CALCIUM 8.9 8.7*  MG 2.2  --    Liver Function Tests: No results for input(s): AST, ALT, ALKPHOS, BILITOT, PROT, ALBUMIN in the last 72 hours. No results for input(s): LIPASE, AMYLASE in the last 72 hours. CBC: Recent Labs    01/15/21 0418  WBC 8.9  HGB 12.2*  HCT 37.6*  MCV 93.8  PLT 328   Cardiac Enzymes: No results for input(s): CKTOTAL, CKMB, CKMBINDEX, TROPONINI in the last 72 hours. BNP: Invalid input(s): POCBNP D-Dimer: No results for input(s): DDIMER in the last 72 hours. Hemoglobin A1C: No results for input(s): HGBA1C in the last 72 hours. Fasting Lipid Panel: No results for input(s): CHOL, HDL, LDLCALC, TRIG, CHOLHDL, LDLDIRECT in the last 72 hours. Thyroid Function Tests: No results for input(s): TSH, T4TOTAL, T3FREE, THYROIDAB in the last 72  hours.  Invalid input(s): FREET3 Anemia Panel: No results for input(s): VITAMINB12, FOLATE, FERRITIN, TIBC, IRON, RETICCTPCT in the last 72 hours.   PHYSICAL EXAM General: Well developed, well nourished, in no acute distress HEENT:  Normocephalic and atramatic Neck:  No JVD.  Lungs: Clear bilaterally to auscultation and percussion. Heart: HRRR . Normal S1 and S2 without gallops or murmurs.  Abdomen: Bowel sounds are positive, abdomen soft and non-tender  Msk:  Back normal, normal gait. Normal strength and tone for age. Extremities: No clubbing, cyanosis or edema.   Neuro: Alert and oriented X 3. Psych:  Good affect, responds appropriately  TELEMETRY: atrial fibrillation, HR 88 bpm  ASSESSMENT AND PLAN: Patient heart rate improved, still in atrial fibrillation. Continue amiodarone and metoprolol at current dose. Will continue to observe. Patient heart rate stable. May be be discharged from a cardiac standpoint. Follow up in office Tuesday 01/23/21 at 9:30 am.  Active Problems:   AF (paroxysmal atrial fibrillation) (HCC)   COPD with acute exacerbation (HCC)   Acute respiratory failure (HCC)    Dejan Angert, FNP-C 01/17/2021 8:25 AM

## 2021-01-17 NOTE — Progress Notes (Signed)
Patient refused CPT via Chest Vest therapy at this time. Patient stated "he was still to full from breakfast".

## 2021-01-18 DIAGNOSIS — J441 Chronic obstructive pulmonary disease with (acute) exacerbation: Secondary | ICD-10-CM | POA: Diagnosis not present

## 2021-01-18 LAB — GLUCOSE, CAPILLARY
Glucose-Capillary: 196 mg/dL — ABNORMAL HIGH (ref 70–99)
Glucose-Capillary: 225 mg/dL — ABNORMAL HIGH (ref 70–99)
Glucose-Capillary: 245 mg/dL — ABNORMAL HIGH (ref 70–99)
Glucose-Capillary: 295 mg/dL — ABNORMAL HIGH (ref 70–99)

## 2021-01-18 LAB — RESP PANEL BY RT-PCR (FLU A&B, COVID) ARPGX2
Influenza A by PCR: NEGATIVE
Influenza B by PCR: NEGATIVE
SARS Coronavirus 2 by RT PCR: NEGATIVE

## 2021-01-18 MED ORDER — METHYLPREDNISOLONE SODIUM SUCC 40 MG IJ SOLR
40.0000 mg | Freq: Three times a day (TID) | INTRAMUSCULAR | Status: DC
  Administered 2021-01-18 – 2021-01-19 (×3): 40 mg via INTRAVENOUS
  Filled 2021-01-18 (×3): qty 1

## 2021-01-18 NOTE — Plan of Care (Signed)

## 2021-01-18 NOTE — Progress Notes (Signed)
PROGRESS NOTE    Raymond Hays  ZLD:[id number] DOB: Aug 24, 1945 DOA: 01/09/2021 PCP: [facility]    Brief Narrative:  75 y.o. male with past medical history significant for COPD, type II DM, paroxysmal atrial fibrillation, dyslipidemia, osteoarthritis, hypertension, obesity, who presented to the hospital because of worsening shortness of breath.  His oxygen saturation was in the mid 80s when EMS picked him up.  He was placed on BiPAP and was transported to the emergency room for further evaluation.   He was admitted to the hospital for acute hypoxemic respiratory failure and COPD exacerbation.  He was also treated with IV Lasix for suspected acute diastolic CHF.  He developed atrial fibrillation with RVR rapid ventricular response requiring treatment with IV Cardizem infusion followed by IV amiodarone.  Heart rate is improved.  Patient remains on metoprolol and p.o. Cardizem.  Main barrier to discharge is respiratory issues.  Patient with diffusely rhonchorous breath sounds and shortness of breath.   Assessment & Plan:   Active Problems:   AF (paroxysmal atrial fibrillation) (HCC)   COPD with acute exacerbation (HCC)   Acute respiratory failure (HCC)  Acute exacerbation COPD No evidence of infiltrate on x-ray Improving with chest PT and nebulizer Plan: Continue Qvar bronchodilators Twice daily Brovana/Pulmicort Chest PT, percussion therapy every 8 hours Continue empiric IV steroids As needed antitussives  Acute on chronic diastolic congestive heart failure Mild exacerbation Plan: Volume status stable Continue p.o. torsemide 10 mg daily Outpatient cardiology follow-up  Atrial fibrillation with rapid ventricular response Heart rate improved Continue oral amiodarone and metoprolol Continue Eliquis for VTE prophylaxis Outpatient cardiology follow-up  Elevated troponin ACS ruled out  Hypertension Continue metoprolol  Intractable nausea and  vomiting Resolved  AKI Resolved  Leaking supra pubic catheter Seen by urology Leaking stopped Outpatient urology follow-up    DVT prophylaxis: Apixaban Code Status: Full Family Communication: None today Disposition Plan: Status is: Inpatient  Remains inpatient appropriate because: Still clinical evidence of COPD exacerbation.  Rhonchorous breath sounds, end expiratory wheeze.  Tentative plan discharge 24 hours       Level of care: Med-Surg  Consultants:  None  Procedures:  None  Antimicrobials: None   Subjective: Patient seen and examined.  Reports improvement in shortness of breath.  No pain complaints  Objective: Vitals:   01/17/21 1524 01/17/21 1955 01/18/21 0351 01/18/21 0807  BP:  103/70 115/65 (!) 114/92  Pulse:  88 83 85  Resp:  16 16 20   Temp:  98.1 F (36.7 C) 98.7 F (37.1 C) 97.8 F (36.6 C)  TempSrc:  Oral Oral Oral  SpO2: 95% 98% 98% 98%  Weight:      Height:        Intake/Output Summary (Last 24 hours) at 01/18/2021 1421 Last data filed at 01/18/2021 1130 Gross per 24 hour  Intake 360 ml  Output 1500 ml  Net -1140 ml   Filed Weights   01/12/21 0527 01/13/21 0440 01/14/21 0327  Weight: 105.2 kg 104.2 kg 105.8 kg    Examination:  General exam: No apparent distress Respiratory system: Scattered rhonchi.  Mild expiratory wheeze.  Normal work of breathing.  Room air Cardiovascular system: S1-S2, regular rate, irregular rhythm, no murmurs, no pedal edema Gastrointestinal system: Soft, NT/ND, normal bowel sounds Central nervous system: Alert and oriented. No focal neurological deficits. Extremities: Symmetric 5 x 5 power. Skin: No rashes, lesions or ulcers Psychiatry: Judgement and insight appear normal. Mood & affect appropriate.     Data Reviewed: I have  personally reviewed following labs and imaging studies  CBC: Recent Labs  Lab 01/15/21 0418  WBC 8.9  HGB 12.2*  HCT 37.6*  MCV 93.8  PLT 328   Basic Metabolic  Panel: Recent Labs  Lab 01/12/21 0440 01/13/21 0858 01/14/21 0547 01/15/21 0418 01/17/21 0311  NA 138 138 138 137 137  K 3.1* 3.4* 4.7 4.6 4.1  CL 101 97* 97* 100 103  CO2 30 31 33* 32 27  GLUCOSE 107* 143* 127* 186* 255*  BUN 25* 23 24* 25* 28*  CREATININE 1.16 1.20 1.03 1.02 1.03  CALCIUM 8.4* 8.8* 8.9 8.9 8.7*  MG 2.0 2.1 2.1 2.2  --    GFR: Estimated Creatinine Clearance: 76.7 mL/min (by C-G formula based on SCr of 1.03 mg/dL). Liver Function Tests: No results for input(s): AST, ALT, ALKPHOS, BILITOT, PROT, ALBUMIN in the last 168 hours.  No results for input(s): LIPASE, AMYLASE in the last 168 hours. No results for input(s): AMMONIA in the last 168 hours. Coagulation Profile: No results for input(s): INR, PROTIME in the last 168 hours. Cardiac Enzymes: No results for input(s): CKTOTAL, CKMB, CKMBINDEX, TROPONINI in the last 168 hours. BNP (last 3 results) No results for input(s): PROBNP in the last 8760 hours. HbA1C: No results for input(s): HGBA1C in the last 72 hours. CBG: Recent Labs  Lab 01/17/21 1154 01/17/21 1642 01/17/21 2205 01/18/21 0808 01/18/21 1157  GLUCAP 187* 281* 252* 196* 225*   Lipid Profile: No results for input(s): CHOL, HDL, LDLCALC, TRIG, CHOLHDL, LDLDIRECT in the last 72 hours. Thyroid Function Tests: No results for input(s): TSH, T4TOTAL, FREET4, T3FREE, THYROIDAB in the last 72 hours. Anemia Panel: No results for input(s): VITAMINB12, FOLATE, FERRITIN, TIBC, IRON, RETICCTPCT in the last 72 hours. Sepsis Labs: Recent Labs  Lab 01/15/21 0418  LATICACIDVEN 2.6*    Recent Results (from the past 240 hour(s))  Blood culture (single)     Status: None   Collection Time: 01/09/21  3:47 PM   Specimen: BLOOD  Result Value Ref Range Status   Specimen Description BLOOD LEFT UPPER ARM  Final   Special Requests   Final    BOTTLES DRAWN AEROBIC AND ANAEROBIC Blood Culture adequate volume   Culture   Final    NO GROWTH 5 DAYS Performed at  [facility], [address], [place], [place] [address]    Report Status 01/14/2021 FINAL  Final  Blood culture (single)     Status: None   Collection Time: 01/09/21  3:48 PM   Specimen: BLOOD  Result Value Ref Range Status   Specimen Description BLOOD RIGHT ANTECUBITAL  Final   Special Requests   Final    BOTTLES DRAWN AEROBIC AND ANAEROBIC Blood Culture adequate volume   Culture   Final    NO GROWTH 5 DAYS Performed at [facility], [address] [place], [place] [address]    Report Status 01/14/2021 FINAL  Final  Resp Panel by RT-PCR (Flu A&B, Covid) Nasopharyngeal Swab     Status: None   Collection Time: 01/10/21  2:06 AM   Specimen: Nasopharyngeal Swab; Nasopharyngeal(NP) swabs in vial transport medium  Result Value Ref Range Status   SARS Coronavirus 2 by RT PCR NEGATIVE NEGATIVE Final    Comment: (NOTE) SARS-CoV-2 target nucleic acids are NOT DETECTED.  The SARS-CoV-2 RNA is generally detectable in upper respiratory specimens during the acute phase of infection. The lowest concentration of SARS-CoV-2 viral copies this assay can detect is 138 copies/mL. A negative  result does not preclude SARS-Cov-2 infection and should not be used as the sole basis for treatment or other patient management decisions. A negative result may occur with  improper specimen collection/handling, submission of specimen other than nasopharyngeal swab, presence of viral mutation(s) within the areas targeted by this assay, and inadequate number of viral copies(<138 copies/mL). A negative result must be combined with clinical observations, patient history, and epidemiological information. The expected result is Negative.  Fact Sheet for Patients:  BloggerCourse.com  Fact Sheet for Healthcare Providers:  SeriousBroker.it  This test is no t yet approved or cleared by the [place] FDA and  has been authorized for detection  and/or diagnosis of SARS-CoV-2 by FDA under an Emergency Use Authorization (EUA). This EUA will remain  in effect (meaning this test can be used) for the duration of the COVID-19 declaration under Section 564(b)(1) of the Act, 21 U.S.C.section 360bbb-3(b)(1), unless the authorization is terminated  or revoked sooner.       Influenza A by PCR NEGATIVE NEGATIVE Final   Influenza B by PCR NEGATIVE NEGATIVE Final    Comment: (NOTE) The Xpert Xpress SARS-CoV-2/FLU/RSV plus assay is intended as an aid in the diagnosis of influenza from Nasopharyngeal swab specimens and should not be used as a sole basis for treatment. Nasal washings and aspirates are unacceptable for Xpert Xpress SARS-CoV-2/FLU/RSV testing.  Fact Sheet for Patients: BloggerCourse.com  Fact Sheet for Healthcare Providers: SeriousBroker.it  This test is not yet approved or cleared by the [place] FDA and has been authorized for detection and/or diagnosis of SARS-CoV-2 by FDA under an Emergency Use Authorization (EUA). This EUA will remain in effect (meaning this test can be used) for the duration of the COVID-19 declaration under Section 564(b)(1) of the Act, 21 U.S.C. section 360bbb-3(b)(1), unless the authorization is terminated or revoked.  Performed at [facility], [address] [place], [place] [address]   MRSA Next Gen by PCR, Nasal     Status: Abnormal   Collection Time: 01/12/21 12:00 PM   Specimen: Nasal Mucosa; Nasal Swab  Result Value Ref Range Status   MRSA by PCR Next Gen DETECTED (A) NOT DETECTED Final    Comment: RESULT CALLED TO, READ BACK BY AND VERIFIED WITH: JAMIE EANES 01/12/21 1326 SLM (NOTE) The GeneXpert MRSA Assay (FDA approved for NASAL specimens only), is one component of a comprehensive MRSA colonization surveillance program. It is not intended to diagnose MRSA infection nor to guide or monitor treatment for MRSA  infections. Test performance is not FDA approved in patients less than 72 years old. Performed at [facility], [address], [place], [place] [address]          Radiology Studies: No results found.      Scheduled Meds:  amiodarone  400 mg Oral Daily   apixaban  5 mg Oral BID   arformoterol  15 mcg Nebulization BID   atorvastatin  20 mg Oral Daily   budesonide (PULMICORT) nebulizer solution  0.25 mg Nebulization BID   carbidopa-levodopa  1 tablet Oral TID AC   escitalopram  5 mg Oral Daily   feeding supplement (GLUCERNA SHAKE)  237 mL Oral BID BM   ferrous sulfate  325 mg Oral TID WC   finasteride  5 mg Oral Daily   fluticasone  1 spray Each Nare Daily   gabapentin  800 mg Oral BID   guaiFENesin  600 mg Oral BID   insulin aspart  0-15 Units Subcutaneous TID AC &  HS   insulin aspart  3 Units Subcutaneous TID WC   insulin glargine-yfgn  10 Units Subcutaneous Daily   ipratropium-albuterol  3 mL Nebulization Q4H   lactase  3,000 Units Oral TID WC   methocarbamol  1,000 mg Oral BID   methylPREDNISolone (SOLU-MEDROL) injection  40 mg Intravenous Q8H   metoprolol tartrate  100 mg Oral BID   Muscle Rub  1 application Topical TID   oxybutynin  10 mg Oral Daily   pantoprazole  40 mg Oral BID AC   polyethylene glycol  17 g Oral Daily   psyllium  1 packet Oral Daily   senna-docusate  1 tablet Oral QHS   torsemide  10 mg Oral Daily   traZODone  100 mg Oral QHS   Continuous Infusions:  promethazine (PHENERGAN) injection (IM or IVPB) 200 mL/hr at 01/13/21 0600     LOS: 9 days    Time spent: 25 minutes    Tresa Moore, MD [facility]   If 7PM-7AM, please contact night-coverage  01/18/2021, 2:21 PM

## 2021-01-19 DIAGNOSIS — J441 Chronic obstructive pulmonary disease with (acute) exacerbation: Secondary | ICD-10-CM | POA: Diagnosis not present

## 2021-01-19 LAB — GLUCOSE, CAPILLARY
Glucose-Capillary: 257 mg/dL — ABNORMAL HIGH (ref 70–99)
Glucose-Capillary: 244 mg/dL — ABNORMAL HIGH (ref 70–99)
Glucose-Capillary: 245 mg/dL — ABNORMAL HIGH (ref 70–99)
Glucose-Capillary: 237 mg/dL — ABNORMAL HIGH (ref 70–99)

## 2021-01-19 MED ORDER — ACETYLCYSTEINE 20 % IN SOLN
4.0000 mL | Freq: Two times a day (BID) | RESPIRATORY_TRACT | Status: DC
  Administered 2021-01-19 – 2021-01-22 (×7): 4 mL via RESPIRATORY_TRACT
  Filled 2021-01-19 (×9): qty 4

## 2021-01-19 MED ORDER — INSULIN GLARGINE-YFGN 100 UNIT/ML ~~LOC~~ SOLN
14.0000 [IU] | Freq: Every day | SUBCUTANEOUS | Status: DC
  Administered 2021-01-19: 14 [IU] via SUBCUTANEOUS
  Filled 2021-01-19 (×2): qty 0.14

## 2021-01-19 MED ORDER — METHYLPREDNISOLONE SODIUM SUCC 125 MG IJ SOLR
80.0000 mg | Freq: Every day | INTRAMUSCULAR | Status: DC
  Administered 2021-01-19: 80 mg via INTRAVENOUS
  Filled 2021-01-19: qty 2

## 2021-01-19 MED ORDER — IPRATROPIUM-ALBUTEROL 0.5-2.5 (3) MG/3ML IN SOLN
3.0000 mL | Freq: Four times a day (QID) | RESPIRATORY_TRACT | Status: DC
  Administered 2021-01-20 – 2021-01-22 (×7): 3 mL via RESPIRATORY_TRACT
  Filled 2021-01-19 (×7): qty 3

## 2021-01-19 NOTE — Progress Notes (Signed)
PROGRESS NOTE    Raymond Hays  LTJ:[id number] DOB: 20-Jul-1945 DOA: 01/09/2021 PCP: [facility]    Brief Narrative:   75 y.o. male with past medical history significant for COPD, type II DM, paroxysmal atrial fibrillation, dyslipidemia, osteoarthritis, hypertension, obesity, who presented to the hospital because of worsening shortness of breath.  His oxygen saturation was in the mid 80s when EMS picked him up.  He was placed on BiPAP and was transported to the emergency room for further evaluation.  He was admitted to the hospital for acute hypoxemic respiratory failure and COPD exacerbation.  He was also treated with IV Lasix for suspected acute diastolic CHF.  He developed atrial fibrillation with RVR rapid ventricular response requiring treatment with IV Cardizem infusion followed by IV amiodarone.  Heart rate is improved.  Patient remains on metoprolol and p.o. Cardizem.  Main barrier to discharge is respiratory issues.  Patient with diffusely rhonchorous breath sounds and shortness of breath.   Assessment & Plan:   Active Problems:   AF (paroxysmal atrial fibrillation) (HCC)   COPD with acute exacerbation (HCC)   Acute respiratory failure (HCC)  Acute exacerbation COPD No evidence of infiltrate on x-ray Improving with chest PT and nebulizer Unable to clear phlegm Plan: Continue 4 times daily bronchodilator Twice daily Brovana/Pulmicort Add twice daily and acetylcysteine nebulizer Chest PT, percussion therapy every 8 hours Continue empiric IV steroids, decrease dose to 40 mg every 12 As needed antitussives Encourage incentive spirometry and flutter valve use  Acute on chronic diastolic congestive heart failure Mild exacerbation 5 status optimized with use of IV diuretic Plan: Continue PTA torsemide 10 mg daily Outpatient cardiology follow-up  Atrial fibrillation with rapid ventricular response Heart rate improved Continue oral amiodarone and metoprolol Continue  Eliquis for VTE prophylaxis Outpatient cardiology follow-up  Elevated troponin ACS ruled out  Hypertension Continue metoprolol  Intractable nausea and vomiting Resolved  AKI Resolved  Leaking supra pubic catheter Seen by urology Leaking stopped Outpatient urology follow-up    DVT prophylaxis: Apixaban Code Status: Full Family Communication: None today Disposition Plan: Status is: Inpatient  Remains inpatient appropriate because: Still clinical evidence of COPD exacerbation.  Rhonchorous breath sounds, end expiratory wheeze.  Added nebulized acetylcysteine.  Hopefully discharge 24 hours       Level of care: Med-Surg  Consultants:  None  Procedures:  None  Antimicrobials: None   Subjective: Patient seen and examined.  Still with cough and expiratory wheeze and rhonchorous breath sounds.  Objective: Vitals:   01/18/21 2002 01/18/21 2125 01/19/21 0635 01/19/21 0803  BP:  112/77 131/89 125/86  Pulse:  93 81 81  Resp:  20 20 16   Temp:  97.7 F (36.5 C) 98.4 F (36.9 C) (!) 97.5 F (36.4 C)  TempSrc:  Oral Oral Oral  SpO2: 97% 98% 96% 96%  Weight:      Height:        Intake/Output Summary (Last 24 hours) at 01/19/2021 1042 Last data filed at 01/19/2021 1031 Gross per 24 hour  Intake 600 ml  Output 1450 ml  Net -850 ml   Filed Weights   01/12/21 0527 01/13/21 0440 01/14/21 0327  Weight: 105.2 kg 104.2 kg 105.8 kg    Examination:  General exam: No apparent distress Respiratory system: Bibasilar rhonchi.  No end expiratory wheeze.  Normal work of breathing.  Room air Cardiovascular system: S1-S2, regular rate, irregular rhythm, no murmurs Gastrointestinal system: Soft, NT/ND, normal bowel sounds Central nervous system: Alert and oriented. No focal  neurological deficits. Extremities: Symmetric 5 x 5 power. Skin: No rashes, lesions or ulcers Psychiatry: Judgement and insight appear normal. Mood & affect appropriate.     Data Reviewed: I  have personally reviewed following labs and imaging studies  CBC: Recent Labs  Lab 01/15/21 0418  WBC 8.9  HGB 12.2*  HCT 37.6*  MCV 93.8  PLT 328   Basic Metabolic Panel: Recent Labs  Lab 01/13/21 0858 01/14/21 0547 01/15/21 0418 01/17/21 0311  NA 138 138 137 137  K 3.4* 4.7 4.6 4.1  CL 97* 97* 100 103  CO2 31 33* 32 27  GLUCOSE 143* 127* 186* 255*  BUN 23 24* 25* 28*  CREATININE 1.20 1.03 1.02 1.03  CALCIUM 8.8* 8.9 8.9 8.7*  MG 2.1 2.1 2.2  --    GFR: Estimated Creatinine Clearance: 76.7 mL/min (by C-G formula based on SCr of 1.03 mg/dL). Liver Function Tests: No results for input(s): AST, ALT, ALKPHOS, BILITOT, PROT, ALBUMIN in the last 168 hours.  No results for input(s): LIPASE, AMYLASE in the last 168 hours. No results for input(s): AMMONIA in the last 168 hours. Coagulation Profile: No results for input(s): INR, PROTIME in the last 168 hours. Cardiac Enzymes: No results for input(s): CKTOTAL, CKMB, CKMBINDEX, TROPONINI in the last 168 hours. BNP (last 3 results) No results for input(s): PROBNP in the last 8760 hours. HbA1C: No results for input(s): HGBA1C in the last 72 hours. CBG: Recent Labs  Lab 01/18/21 0808 01/18/21 1157 01/18/21 1650 01/18/21 2116 01/19/21 0800  GLUCAP 196* 225* 245* 295* 257*   Lipid Profile: No results for input(s): CHOL, HDL, LDLCALC, TRIG, CHOLHDL, LDLDIRECT in the last 72 hours. Thyroid Function Tests: No results for input(s): TSH, T4TOTAL, FREET4, T3FREE, THYROIDAB in the last 72 hours. Anemia Panel: No results for input(s): VITAMINB12, FOLATE, FERRITIN, TIBC, IRON, RETICCTPCT in the last 72 hours. Sepsis Labs: Recent Labs  Lab 01/15/21 0418  LATICACIDVEN 2.6*    Recent Results (from the past 240 hour(s))  Blood culture (single)     Status: None   Collection Time: 01/09/21  3:47 PM   Specimen: BLOOD  Result Value Ref Range Status   Specimen Description BLOOD LEFT UPPER ARM  Final   Special Requests   Final     BOTTLES DRAWN AEROBIC AND ANAEROBIC Blood Culture adequate volume   Culture   Final    NO GROWTH 5 DAYS Performed at [facility], [address], [place], [place] [address]    Report Status 01/14/2021 FINAL  Final  Blood culture (single)     Status: None   Collection Time: 01/09/21  3:48 PM   Specimen: BLOOD  Result Value Ref Range Status   Specimen Description BLOOD RIGHT ANTECUBITAL  Final   Special Requests   Final    BOTTLES DRAWN AEROBIC AND ANAEROBIC Blood Culture adequate volume   Culture   Final    NO GROWTH 5 DAYS Performed at [facility], [address] [place], [place] [address]    Report Status 01/14/2021 FINAL  Final  Resp Panel by RT-PCR (Flu A&B, Covid) Nasopharyngeal Swab     Status: None   Collection Time: 01/10/21  2:06 AM   Specimen: Nasopharyngeal Swab; Nasopharyngeal(NP) swabs in vial transport medium  Result Value Ref Range Status   SARS Coronavirus 2 by RT PCR NEGATIVE NEGATIVE Final    Comment: (NOTE) SARS-CoV-2 target nucleic acids are NOT DETECTED.  The SARS-CoV-2 RNA is generally detectable in upper respiratory specimens during  the acute phase of infection. The lowest concentration of SARS-CoV-2 viral copies this assay can detect is 138 copies/mL. A negative result does not preclude SARS-Cov-2 infection and should not be used as the sole basis for treatment or other patient management decisions. A negative result may occur with  improper specimen collection/handling, submission of specimen other than nasopharyngeal swab, presence of viral mutation(s) within the areas targeted by this assay, and inadequate number of viral copies(<138 copies/mL). A negative result must be combined with clinical observations, patient history, and epidemiological information. The expected result is Negative.  Fact Sheet for Patients:  BloggerCourse.com  Fact Sheet for Healthcare Providers:   SeriousBroker.it  This test is no t yet approved or cleared by the [place] FDA and  has been authorized for detection and/or diagnosis of SARS-CoV-2 by FDA under an Emergency Use Authorization (EUA). This EUA will remain  in effect (meaning this test can be used) for the duration of the COVID-19 declaration under Section 564(b)(1) of the Act, 21 U.S.C.section 360bbb-3(b)(1), unless the authorization is terminated  or revoked sooner.       Influenza A by PCR NEGATIVE NEGATIVE Final   Influenza B by PCR NEGATIVE NEGATIVE Final    Comment: (NOTE) The Xpert Xpress SARS-CoV-2/FLU/RSV plus assay is intended as an aid in the diagnosis of influenza from Nasopharyngeal swab specimens and should not be used as a sole basis for treatment. Nasal washings and aspirates are unacceptable for Xpert Xpress SARS-CoV-2/FLU/RSV testing.  Fact Sheet for Patients: BloggerCourse.com  Fact Sheet for Healthcare Providers: SeriousBroker.it  This test is not yet approved or cleared by the [place] FDA and has been authorized for detection and/or diagnosis of SARS-CoV-2 by FDA under an Emergency Use Authorization (EUA). This EUA will remain in effect (meaning this test can be used) for the duration of the COVID-19 declaration under Section 564(b)(1) of the Act, 21 U.S.C. section 360bbb-3(b)(1), unless the authorization is terminated or revoked.  Performed at [facility], [address] [place], [place] [address]   MRSA Next Gen by PCR, Nasal     Status: Abnormal   Collection Time: 01/12/21 12:00 PM   Specimen: Nasal Mucosa; Nasal Swab  Result Value Ref Range Status   MRSA by PCR Next Gen DETECTED (A) NOT DETECTED Final    Comment: RESULT CALLED TO, READ BACK BY AND VERIFIED WITH: JAMIE EANES 01/12/21 1326 SLM (NOTE) The GeneXpert MRSA Assay (FDA approved for NASAL specimens only), is one component of  a comprehensive MRSA colonization surveillance program. It is not intended to diagnose MRSA infection nor to guide or monitor treatment for MRSA infections. Test performance is not FDA approved in patients less than 25 years old. Performed at [facility], [address] [place], [place] [address]   Resp Panel by RT-PCR (Flu A&B, Covid) Nasopharyngeal Swab     Status: None   Collection Time: 01/18/21  3:26 PM   Specimen: Nasopharyngeal Swab; Nasopharyngeal(NP) swabs in vial transport medium  Result Value Ref Range Status   SARS Coronavirus 2 by RT PCR NEGATIVE NEGATIVE Final    Comment: (NOTE) SARS-CoV-2 target nucleic acids are NOT DETECTED.  The SARS-CoV-2 RNA is generally detectable in upper respiratory specimens during the acute phase of infection. The lowest concentration of SARS-CoV-2 viral copies this assay can detect is 138 copies/mL. A negative result does not preclude SARS-Cov-2 infection and should not be used as the sole basis for treatment or other patient management decisions. A negative result may  occur with  improper specimen collection/handling, submission of specimen other than nasopharyngeal swab, presence of viral mutation(s) within the areas targeted by this assay, and inadequate number of viral copies(<138 copies/mL). A negative result must be combined with clinical observations, patient history, and epidemiological information. The expected result is Negative.  Fact Sheet for Patients:  BloggerCourse.com  Fact Sheet for Healthcare Providers:  SeriousBroker.it  This test is no t yet approved or cleared by the [place] FDA and  has been authorized for detection and/or diagnosis of SARS-CoV-2 by FDA under an Emergency Use Authorization (EUA). This EUA will remain  in effect (meaning this test can be used) for the duration of the COVID-19 declaration under Section 564(b)(1) of the Act,  21 U.S.C.section 360bbb-3(b)(1), unless the authorization is terminated  or revoked sooner.       Influenza A by PCR NEGATIVE NEGATIVE Final   Influenza B by PCR NEGATIVE NEGATIVE Final    Comment: (NOTE) The Xpert Xpress SARS-CoV-2/FLU/RSV plus assay is intended as an aid in the diagnosis of influenza from Nasopharyngeal swab specimens and should not be used as a sole basis for treatment. Nasal washings and aspirates are unacceptable for Xpert Xpress SARS-CoV-2/FLU/RSV testing.  Fact Sheet for Patients: BloggerCourse.com  Fact Sheet for Healthcare Providers: SeriousBroker.it  This test is not yet approved or cleared by the [place] FDA and has been authorized for detection and/or diagnosis of SARS-CoV-2 by FDA under an Emergency Use Authorization (EUA). This EUA will remain in effect (meaning this test can be used) for the duration of the COVID-19 declaration under Section 564(b)(1) of the Act, 21 U.S.C. section 360bbb-3(b)(1), unless the authorization is terminated or revoked.  Performed at [facility], [address] [place], [place] [address]          Radiology Studies: No results found.      Scheduled Meds:  acetylcysteine  4 mL Nebulization BID   amiodarone  400 mg Oral Daily   apixaban  5 mg Oral BID   arformoterol  15 mcg Nebulization BID   atorvastatin  20 mg Oral Daily   budesonide (PULMICORT) nebulizer solution  0.25 mg Nebulization BID   carbidopa-levodopa  1 tablet Oral TID AC   escitalopram  5 mg Oral Daily   feeding supplement (GLUCERNA SHAKE)  237 mL Oral BID BM   ferrous sulfate  325 mg Oral TID WC   finasteride  5 mg Oral Daily   fluticasone  1 spray Each Nare Daily   gabapentin  800 mg Oral BID   guaiFENesin  600 mg Oral BID   insulin aspart  0-15 Units Subcutaneous TID AC & HS   insulin aspart  3 Units Subcutaneous TID WC   insulin glargine-yfgn  14 Units Subcutaneous  Daily   ipratropium-albuterol  3 mL Nebulization Q4H   lactase  3,000 Units Oral TID WC   methocarbamol  1,000 mg Oral BID   methylPREDNISolone (SOLU-MEDROL) injection  40 mg Intravenous Q8H   metoprolol tartrate  100 mg Oral BID   Muscle Rub  1 application Topical TID   oxybutynin  10 mg Oral Daily   pantoprazole  40 mg Oral BID AC   polyethylene glycol  17 g Oral Daily   psyllium  1 packet Oral Daily   senna-docusate  1 tablet Oral QHS   torsemide  10 mg Oral Daily   traZODone  100 mg Oral QHS   Continuous Infusions:  promethazine (PHENERGAN) injection (IM or IVPB) 200 mL/hr  at 01/13/21 0600     LOS: 10 days    Time spent: 25 minutes    Tresa Moore, MD [facility]   If 7PM-7AM, please contact night-coverage  01/19/2021, 10:42 AM

## 2021-01-19 NOTE — TOC Progression Note (Signed)
Transition of Care [facility]) - Progression Note    Patient Details  Name: Raymond Hays MRN: [id number] Date of Birth: 09/17/1945  [facility] CM/SW Contact  Liliana Cline, LCSW Phone Number: 01/19/2021, 12:01 PM  Clinical Narrative:   Patient not medically ready for DC today. CSW left VM for Gavin Pound at [facility] to see if they can take patient over the weekend if patient is medically ready.    Expected Discharge Plan: Skilled Nursing Facility Barriers to Discharge: Continued Medical Work up  Expected Discharge Plan and Services Expected Discharge Plan: Skilled Nursing Facility                                               Social Determinants of Health (SDOH) Interventions    Readmission Risk Interventions Readmission Risk Prevention Plan 01/12/2021 10/20/2020  Transportation Screening Complete Complete  PCP or Specialist Appt within 3-5 Days - Complete  HRI or Home Care Consult - Complete  Palliative Care Screening - Not Applicable  Medication Review (RN Care Manager) Complete Referral to Pharmacy  PCP or Specialist appointment within 3-5 days of discharge Complete -  HRI or Home Care Consult Complete -  SW Recovery Care/Counseling Consult Complete -  Palliative Care Screening Not Applicable -  Skilled Nursing Facility Complete -

## 2021-01-20 DIAGNOSIS — J441 Chronic obstructive pulmonary disease with (acute) exacerbation: Secondary | ICD-10-CM | POA: Diagnosis not present

## 2021-01-20 LAB — GLUCOSE, CAPILLARY
Glucose-Capillary: 205 mg/dL — ABNORMAL HIGH (ref 70–99)
Glucose-Capillary: 227 mg/dL — ABNORMAL HIGH (ref 70–99)
Glucose-Capillary: 305 mg/dL — ABNORMAL HIGH (ref 70–99)
Glucose-Capillary: 272 mg/dL — ABNORMAL HIGH (ref 70–99)

## 2021-01-20 MED ORDER — INSULIN GLARGINE-YFGN 100 UNIT/ML ~~LOC~~ SOLN
12.0000 [IU] | Freq: Every day | SUBCUTANEOUS | Status: DC
  Administered 2021-01-20 – 2021-01-22 (×3): 12 [IU] via SUBCUTANEOUS
  Filled 2021-01-20 (×4): qty 0.12

## 2021-01-20 MED ORDER — PREDNISONE 20 MG PO TABS
40.0000 mg | ORAL_TABLET | Freq: Every day | ORAL | Status: DC
  Administered 2021-01-20 – 2021-01-22 (×3): 40 mg via ORAL
  Filled 2021-01-20 (×3): qty 2

## 2021-01-20 NOTE — Progress Notes (Signed)
PROGRESS NOTE    Render Raymond Hays  WUJ:[id number] DOB: 1945-11-13 DOA: 01/09/2021 PCP: [facility]    Brief Narrative:   75 y.o. male with past medical history significant for COPD, type II DM, paroxysmal atrial fibrillation, dyslipidemia, osteoarthritis, hypertension, obesity, who presented to the hospital because of worsening shortness of breath.  His oxygen saturation was in the mid 80s when EMS picked him up.  He was placed on BiPAP and was transported to the emergency room for further evaluation.  He was admitted to the hospital for acute hypoxemic respiratory failure and COPD exacerbation.  He was also treated with IV Lasix for suspected acute diastolic CHF.  He developed atrial fibrillation with RVR rapid ventricular response requiring treatment with IV Cardizem infusion followed by IV amiodarone.  Heart rate is improved.  Patient remains on metoprolol and p.o. Cardizem.  Main barrier to discharge is respiratory issues.  Patient with diffusely rhonchorous breath sounds and shortness of breath.   Assessment & Plan:   Active Problems:   AF (paroxysmal atrial fibrillation) (HCC)   COPD with acute exacerbation (HCC)   Acute respiratory failure (HCC)  Acute exacerbation COPD No evidence of infiltrate on x-ray Improving with chest PT and nebulizer Unable to clear phlegm Mucomyst inhaler added 10/21, good result Plan: Continue 4 times daily DuoNebs Continue twice daily Brovana and Pulmicort Continue twice daily Mucomyst nebulizer Continue every 8 hour chest PT progress in therapy DC IV steroids, start p.o. prednisone 40 mg daily As needed antitussives I-S and flutter use volume  Acute on chronic diastolic congestive heart failure Mild exacerbation Volume patient seen and examined.  Cough and shortness of breath are improving but not yet at baseline.  Status optimized with use of IV diuretic Plan: Continue PTA torsemide 10 mg daily Outpatient cardiology follow-up  Atrial  fibrillation with rapid ventricular response Heart rate improved Continue oral amiodarone and metoprolol Continue Eliquis for VTE prophylaxis Outpatient cardiology follow-up  Elevated troponin ACS ruled out  Hypertension Continue metoprolol  Intractable nausea and vomiting Resolved  AKI Resolved  Leaking supra pubic catheter Seen by urology Leaking stopped Outpatient urology follow-up    DVT prophylaxis: Apixaban Code Status: Full Family Communication: None today Disposition Plan: Status is: Inpatient  Remains inpatient appropriate because: Still clinical evidence of COPD exacerbation.  Rhonchorous breath sounds, end expiratory wheeze.  Added nebulized acetylcysteine.  Hopefully discharge 24 hours       Level of care: Med-Surg  Consultants:  None  Procedures:  None  Antimicrobials: None   Subjective: Patient seen and examined.  Still with cough and expiratory wheeze and rhonchorous breath sounds.  Objective: Vitals:   01/19/21 2159 01/20/21 0534 01/20/21 0744 01/20/21 0814  BP: 123/90 (!) 142/95  118/73  Pulse: 89 79  74  Resp: 18 16  19   Temp: 97.7 F (36.5 C) 97.7 F (36.5 C)  98.7 F (37.1 C)  TempSrc: Oral Oral    SpO2: 97% 99% 96% 100%  Weight:  79.1 kg    Height:        Intake/Output Summary (Last 24 hours) at 01/20/2021 1053 Last data filed at 01/20/2021 0547 Gross per 24 hour  Intake 240 ml  Output 1900 ml  Net -1660 ml   Filed Weights   01/13/21 0440 01/14/21 0327 01/20/21 0534  Weight: 104.2 kg 105.8 kg 79.1 kg    Examination:  General exam: No acute distress Respiratory system: Scattered rhonchi bilaterally.  End expiratory wheeze.  Normal breathing.  Room air Cardiovascular  system: S1-S2, regular rate, irregular rhythm, no murmurs Gastrointestinal system: Soft, NT/ND, normal bowel sounds Central nervous system: Alert and oriented. No focal neurological deficits. Extremities: Symmetric 5 x 5 power. Skin: No rashes,  lesions or ulcers Psychiatry: Judgement and insight appear normal. Mood & affect appropriate.     Data Reviewed: I have personally reviewed following labs and imaging studies  CBC: Recent Labs  Lab 01/15/21 0418  WBC 8.9  HGB 12.2*  HCT 37.6*  MCV 93.8  PLT 328   Basic Metabolic Panel: Recent Labs  Lab 01/14/21 0547 01/15/21 0418 01/17/21 0311  NA 138 137 137  K 4.7 4.6 4.1  CL 97* 100 103  CO2 33* 32 27  GLUCOSE 127* 186* 255*  BUN 24* 25* 28*  CREATININE 1.03 1.02 1.03  CALCIUM 8.9 8.9 8.7*  MG 2.1 2.2  --    GFR: Estimated Creatinine Clearance: 67 mL/min (by C-G formula based on SCr of 1.03 mg/dL). Liver Function Tests: No results for input(s): AST, ALT, ALKPHOS, BILITOT, PROT, ALBUMIN in the last 168 hours.  No results for input(s): LIPASE, AMYLASE in the last 168 hours. No results for input(s): AMMONIA in the last 168 hours. Coagulation Profile: No results for input(s): INR, PROTIME in the last 168 hours. Cardiac Enzymes: No results for input(s): CKTOTAL, CKMB, CKMBINDEX, TROPONINI in the last 168 hours. BNP (last 3 results) No results for input(s): PROBNP in the last 8760 hours. HbA1C: No results for input(s): HGBA1C in the last 72 hours. CBG: Recent Labs  Lab 01/19/21 0800 01/19/21 1143 01/19/21 1620 01/19/21 2201 01/20/21 0817  GLUCAP 257* 244* 245* 237* 205*   Lipid Profile: No results for input(s): CHOL, HDL, LDLCALC, TRIG, CHOLHDL, LDLDIRECT in the last 72 hours. Thyroid Function Tests: No results for input(s): TSH, T4TOTAL, FREET4, T3FREE, THYROIDAB in the last 72 hours. Anemia Panel: No results for input(s): VITAMINB12, FOLATE, FERRITIN, TIBC, IRON, RETICCTPCT in the last 72 hours. Sepsis Labs: Recent Labs  Lab 01/15/21 0418  LATICACIDVEN 2.6*    Recent Results (from the past 240 hour(s))  MRSA Next Gen by PCR, Nasal     Status: Abnormal   Collection Time: 01/12/21 12:00 PM   Specimen: Nasal Mucosa; Nasal Swab  Result Value Ref  Range Status   MRSA by PCR Next Gen DETECTED (A) NOT DETECTED Final    Comment: RESULT CALLED TO, READ BACK BY AND VERIFIED WITH: JAMIE EANES 01/12/21 1326 SLM (NOTE) The GeneXpert MRSA Assay (FDA approved for NASAL specimens only), is one component of a comprehensive MRSA colonization surveillance program. It is not intended to diagnose MRSA infection nor to guide or monitor treatment for MRSA infections. Test performance is not FDA approved in patients less than 20 years old. Performed at [facility], [address] [place], [place] [address]   Resp Panel by RT-PCR (Flu A&B, Covid) Nasopharyngeal Swab     Status: None   Collection Time: 01/18/21  3:26 PM   Specimen: Nasopharyngeal Swab; Nasopharyngeal(NP) swabs in vial transport medium  Result Value Ref Range Status   SARS Coronavirus 2 by RT PCR NEGATIVE NEGATIVE Final    Comment: (NOTE) SARS-CoV-2 target nucleic acids are NOT DETECTED.  The SARS-CoV-2 RNA is generally detectable in upper respiratory specimens during the acute phase of infection. The lowest concentration of SARS-CoV-2 viral copies this assay can detect is 138 copies/mL. A negative result does not preclude SARS-Cov-2 infection and should not be used as the sole basis for treatment or other patient management  decisions. A negative result may occur with  improper specimen collection/handling, submission of specimen other than nasopharyngeal swab, presence of viral mutation(s) within the areas targeted by this assay, and inadequate number of viral copies(<138 copies/mL). A negative result must be combined with clinical observations, patient history, and epidemiological information. The expected result is Negative.  Fact Sheet for Patients:  BloggerCourse.com  Fact Sheet for Healthcare Providers:  SeriousBroker.it  This test is no t yet approved or cleared by the [place] FDA and  has been  authorized for detection and/or diagnosis of SARS-CoV-2 by FDA under an Emergency Use Authorization (EUA). This EUA will remain  in effect (meaning this test can be used) for the duration of the COVID-19 declaration under Section 564(b)(1) of the Act, 21 U.S.C.section 360bbb-3(b)(1), unless the authorization is terminated  or revoked sooner.       Influenza A by PCR NEGATIVE NEGATIVE Final   Influenza B by PCR NEGATIVE NEGATIVE Final    Comment: (NOTE) The Xpert Xpress SARS-CoV-2/FLU/RSV plus assay is intended as an aid in the diagnosis of influenza from Nasopharyngeal swab specimens and should not be used as a sole basis for treatment. Nasal washings and aspirates are unacceptable for Xpert Xpress SARS-CoV-2/FLU/RSV testing.  Fact Sheet for Patients: BloggerCourse.com  Fact Sheet for Healthcare Providers: SeriousBroker.it  This test is not yet approved or cleared by the [place] FDA and has been authorized for detection and/or diagnosis of SARS-CoV-2 by FDA under an Emergency Use Authorization (EUA). This EUA will remain in effect (meaning this test can be used) for the duration of the COVID-19 declaration under Section 564(b)(1) of the Act, 21 U.S.C. section 360bbb-3(b)(1), unless the authorization is terminated or revoked.  Performed at [facility], [address] [place], [place] [address]          Radiology Studies: No results found.      Scheduled Meds:  acetylcysteine  4 mL Nebulization BID   amiodarone  400 mg Oral Daily   apixaban  5 mg Oral BID   arformoterol  15 mcg Nebulization BID   atorvastatin  20 mg Oral Daily   budesonide (PULMICORT) nebulizer solution  0.25 mg Nebulization BID   carbidopa-levodopa  1 tablet Oral TID AC   escitalopram  5 mg Oral Daily   feeding supplement (GLUCERNA SHAKE)  237 mL Oral BID BM   ferrous sulfate  325 mg Oral TID WC   finasteride  5 mg Oral Daily    fluticasone  1 spray Each Nare Daily   gabapentin  800 mg Oral BID   guaiFENesin  600 mg Oral BID   insulin aspart  0-15 Units Subcutaneous TID AC & HS   insulin aspart  3 Units Subcutaneous TID WC   insulin glargine-yfgn  12 Units Subcutaneous Daily   ipratropium-albuterol  3 mL Nebulization Q6H   lactase  3,000 Units Oral TID WC   methocarbamol  1,000 mg Oral BID   metoprolol tartrate  100 mg Oral BID   Muscle Rub  1 application Topical TID   oxybutynin  10 mg Oral Daily   pantoprazole  40 mg Oral BID AC   polyethylene glycol  17 g Oral Daily   predniSONE  40 mg Oral Q breakfast   psyllium  1 packet Oral Daily   senna-docusate  1 tablet Oral QHS   torsemide  10 mg Oral Daily   traZODone  100 mg Oral QHS   Continuous Infusions:  promethazine (PHENERGAN) injection (IM  or IVPB) 200 mL/hr at 01/13/21 0600     LOS: 11 days    Time spent: 25 minutes    Tresa Moore, MD [facility]   If 7PM-7AM, please contact night-coverage  01/20/2021, 10:53 AM

## 2021-01-20 NOTE — TOC Progression Note (Signed)
Transition of Care [facility]) - Progression Note    Patient Details  Name: Raymond Hays MRN: [id number] Date of Birth: 12-26-1945  Transition of Care [facility]) CM/SW Contact  Liliana Cline, LCSW Phone Number: 01/20/2021, 2:03 PM  Clinical Narrative:   Gavin Pound with [facility] cannot take patient back until Monday (if medically cleared).    Expected Discharge Plan: Skilled Nursing Facility Barriers to Discharge: Continued Medical Work up  Expected Discharge Plan and Services Expected Discharge Plan: Skilled Nursing Facility                                               Social Determinants of Health (SDOH) Interventions    Readmission Risk Interventions Readmission Risk Prevention Plan 01/12/2021 10/20/2020  Transportation Screening Complete Complete  PCP or Specialist Appt within 3-5 Days - Complete  HRI or Home Care Consult - Complete  Palliative Care Screening - Not Applicable  Medication Review (RN Care Manager) Complete Referral to Pharmacy  PCP or Specialist appointment within 3-5 days of discharge Complete -  HRI or Home Care Consult Complete -  SW Recovery Care/Counseling Consult Complete -  Palliative Care Screening Not Applicable -  Skilled Nursing Facility Complete -

## 2021-01-21 DIAGNOSIS — J441 Chronic obstructive pulmonary disease with (acute) exacerbation: Secondary | ICD-10-CM | POA: Diagnosis not present

## 2021-01-21 LAB — GLUCOSE, CAPILLARY
Glucose-Capillary: 251 mg/dL — ABNORMAL HIGH (ref 70–99)
Glucose-Capillary: 176 mg/dL — ABNORMAL HIGH (ref 70–99)
Glucose-Capillary: 275 mg/dL — ABNORMAL HIGH (ref 70–99)
Glucose-Capillary: 330 mg/dL — ABNORMAL HIGH (ref 70–99)

## 2021-01-21 NOTE — TOC Progression Note (Signed)
Transition of Care [facility]) - Progression Note    Patient Details  Name: Raymond Hays MRN: [id number] Date of Birth: [birth year]  Transition of Care [facility]) CM/SW Contact  Liliana Cline, LCSW Phone Number: 01/21/2021, 12:17 PM  Clinical Narrative:   Notified Gavin Pound with [facility] that patient should be medically ready for DC back there tomorrow.     Expected Discharge Plan: Skilled Nursing Facility Barriers to Discharge: Continued Medical Work up  Expected Discharge Plan and Services Expected Discharge Plan: Skilled Nursing Facility                                               Social Determinants of Health (SDOH) Interventions    Readmission Risk Interventions Readmission Risk Prevention Plan 01/12/2021 10/20/2020  Transportation Screening Complete Complete  PCP or Specialist Appt within 3-5 Days - Complete  HRI or Home Care Consult - Complete  Palliative Care Screening - Not Applicable  Medication Review (RN Care Manager) Complete Referral to Pharmacy  PCP or Specialist appointment within 3-5 days of discharge Complete -  HRI or Home Care Consult Complete -  SW Recovery Care/Counseling Consult Complete -  Palliative Care Screening Not Applicable -  Skilled Nursing Facility Complete -

## 2021-01-21 NOTE — Progress Notes (Signed)
PROGRESS NOTE    Raymond Hays  ZOX:[id number] DOB: 1945/08/09 DOA: 01/09/2021 PCP: [facility]    Brief Narrative:   75 y.o. male with past medical history significant for COPD, type II DM, paroxysmal atrial fibrillation, dyslipidemia, osteoarthritis, hypertension, obesity, who presented to the hospital because of worsening shortness of breath.  His oxygen saturation was in the mid 80s when EMS picked him up.  He was placed on BiPAP and was transported to the emergency room for further evaluation.  He was admitted to the hospital for acute hypoxemic respiratory failure and COPD exacerbation.  He was also treated with IV Lasix for suspected acute diastolic CHF.  He developed atrial fibrillation with RVR rapid ventricular response requiring treatment with IV Cardizem infusion followed by IV amiodarone.  Heart rate is improved.  Patient remains on metoprolol and p.o. Cardizem.  Main barrier to discharge is respiratory issues.  Patient with diffusely rhonchorous breath sounds and shortness of breath.  Improving with aggressive steroid and neb regimen   Assessment & Plan:   Active Problems:   AF (paroxysmal atrial fibrillation) (HCC)   COPD with acute exacerbation (HCC)   Acute respiratory failure (HCC)  Acute exacerbation COPD No evidence of infiltrate on x-ray Improving with chest PT and nebulizer Unable to clear phlegm Mucomyst inhaler added 10/21, good result Plan: Continue 4 times daily DuoNebs Continue twice daily Brovana and Pulmicort Continue twice daily Mucomyst nebulizer Continue every 8 hour chest PT progress in therapy Continue PO prednisone, 5 day course As needed antitussives I-S and flutter use volume  Acute on chronic diastolic congestive heart failure Mild exacerbation Volume patient seen and examined.  Cough and shortness of breath are improving but not yet at baseline.  Status optimized with use of IV diuretic Plan: Continue PTA torsemide 10 mg  daily Outpatient cardiology follow-up  Atrial fibrillation with rapid ventricular response Heart rate improved Continue oral amiodarone and metoprolol Continue Eliquis for VTE prophylaxis Outpatient cardiology follow-up Will need oral medication prescriptions on dc  Elevated troponin ACS ruled out  Hypertension Continue metoprolol  Intractable nausea and vomiting Resolved  AKI Resolved  Leaking supra pubic catheter Seen by urology Leaking stopped Outpatient urology follow-up    DVT prophylaxis: Apixaban Code Status: Full Family Communication: None today Disposition Plan: Status is: Inpatient  Remains inpatient appropriate because: Still clinical evidence of COPD exacerbation.  Rhonchorous breath sounds, end expiratory wheeze.  Clinically improving.  Anticipate medical readiness for dc 10/24      Level of care: Med-Surg  Consultants:  None  Procedures:  None  Antimicrobials: None   Subjective: Patient seen and examined.  Improved respiratory status  Objective: Vitals:   01/21/21 0549 01/21/21 0557 01/21/21 0704 01/21/21 0922  BP:  (!) 149/94  117/77  Pulse:  79  83  Resp:  16  20  Temp:  98.5 F (36.9 C)  98.8 F (37.1 C)  TempSrc:  Oral    SpO2:  100% 99% 97%  Weight: 80.8 kg     Height:        Intake/Output Summary (Last 24 hours) at 01/21/2021 1005 Last data filed at 01/21/2021 0550 Gross per 24 hour  Intake 600 ml  Output 2500 ml  Net -1900 ml   Filed Weights   01/14/21 0327 01/20/21 0534 01/21/21 0549  Weight: 105.8 kg 79.1 kg 80.8 kg    Examination:  General exam: No apparent distress Respiratory system: Bibasilar rhonchi.  Normal WOB.  Room air Cardiovascular system: S1-S2, regular  rate, irregular rhythm, no murmurs Gastrointestinal system: Soft, NT/ND, normal bowel sounds Central nervous system: Alert and oriented. No focal neurological deficits. Extremities: Symmetric 5 x 5 power. Skin: No rashes, lesions or  ulcers Psychiatry: Judgement and insight appear normal. Mood & affect appropriate.     Data Reviewed: I have personally reviewed following labs and imaging studies  CBC: Recent Labs  Lab 01/15/21 0418  WBC 8.9  HGB 12.2*  HCT 37.6*  MCV 93.8  PLT 328   Basic Metabolic Panel: Recent Labs  Lab 01/15/21 0418 01/17/21 0311  NA 137 137  K 4.6 4.1  CL 100 103  CO2 32 27  GLUCOSE 186* 255*  BUN 25* 28*  CREATININE 1.02 1.03  CALCIUM 8.9 8.7*  MG 2.2  --    GFR: Estimated Creatinine Clearance: 67 mL/min (by C-G formula based on SCr of 1.03 mg/dL). Liver Function Tests: No results for input(s): AST, ALT, ALKPHOS, BILITOT, PROT, ALBUMIN in the last 168 hours.  No results for input(s): LIPASE, AMYLASE in the last 168 hours. No results for input(s): AMMONIA in the last 168 hours. Coagulation Profile: No results for input(s): INR, PROTIME in the last 168 hours. Cardiac Enzymes: No results for input(s): CKTOTAL, CKMB, CKMBINDEX, TROPONINI in the last 168 hours. BNP (last 3 results) No results for input(s): PROBNP in the last 8760 hours. HbA1C: No results for input(s): HGBA1C in the last 72 hours. CBG: Recent Labs  Lab 01/20/21 0817 01/20/21 1135 01/20/21 1715 01/20/21 2147 01/21/21 0846  GLUCAP 205* 227* 305* 272* 251*   Lipid Profile: No results for input(s): CHOL, HDL, LDLCALC, TRIG, CHOLHDL, LDLDIRECT in the last 72 hours. Thyroid Function Tests: No results for input(s): TSH, T4TOTAL, FREET4, T3FREE, THYROIDAB in the last 72 hours. Anemia Panel: No results for input(s): VITAMINB12, FOLATE, FERRITIN, TIBC, IRON, RETICCTPCT in the last 72 hours. Sepsis Labs: Recent Labs  Lab 01/15/21 0418  LATICACIDVEN 2.6*    Recent Results (from the past 240 hour(s))  MRSA Next Gen by PCR, Nasal     Status: Abnormal   Collection Time: 01/12/21 12:00 PM   Specimen: Nasal Mucosa; Nasal Swab  Result Value Ref Range Status   MRSA by PCR Next Gen DETECTED (A) NOT DETECTED  Final    Comment: RESULT CALLED TO, READ BACK BY AND VERIFIED WITH: JAMIE EANES 01/12/21 1326 SLM (NOTE) The GeneXpert MRSA Assay (FDA approved for NASAL specimens only), is one component of a comprehensive MRSA colonization surveillance program. It is not intended to diagnose MRSA infection nor to guide or monitor treatment for MRSA infections. Test performance is not FDA approved in patients less than 7 years old. Performed at [facility], [address] [place], [place] [address]   Resp Panel by RT-PCR (Flu A&B, Covid) Nasopharyngeal Swab     Status: None   Collection Time: 01/18/21  3:26 PM   Specimen: Nasopharyngeal Swab; Nasopharyngeal(NP) swabs in vial transport medium  Result Value Ref Range Status   SARS Coronavirus 2 by RT PCR NEGATIVE NEGATIVE Final    Comment: (NOTE) SARS-CoV-2 target nucleic acids are NOT DETECTED.  The SARS-CoV-2 RNA is generally detectable in upper respiratory specimens during the acute phase of infection. The lowest concentration of SARS-CoV-2 viral copies this assay can detect is 138 copies/mL. A negative result does not preclude SARS-Cov-2 infection and should not be used as the sole basis for treatment or other patient management decisions. A negative result may occur with  improper specimen collection/handling, submission of specimen  other than nasopharyngeal swab, presence of viral mutation(s) within the areas targeted by this assay, and inadequate number of viral copies(<138 copies/mL). A negative result must be combined with clinical observations, patient history, and epidemiological information. The expected result is Negative.  Fact Sheet for Patients:  BloggerCourse.com  Fact Sheet for Healthcare Providers:  SeriousBroker.it  This test is no t yet approved or cleared by the [place] FDA and  has been authorized for detection and/or diagnosis of SARS-CoV-2 by FDA under  an Emergency Use Authorization (EUA). This EUA will remain  in effect (meaning this test can be used) for the duration of the COVID-19 declaration under Section 564(b)(1) of the Act, 21 U.S.C.section 360bbb-3(b)(1), unless the authorization is terminated  or revoked sooner.       Influenza A by PCR NEGATIVE NEGATIVE Final   Influenza B by PCR NEGATIVE NEGATIVE Final    Comment: (NOTE) The Xpert Xpress SARS-CoV-2/FLU/RSV plus assay is intended as an aid in the diagnosis of influenza from Nasopharyngeal swab specimens and should not be used as a sole basis for treatment. Nasal washings and aspirates are unacceptable for Xpert Xpress SARS-CoV-2/FLU/RSV testing.  Fact Sheet for Patients: BloggerCourse.com  Fact Sheet for Healthcare Providers: SeriousBroker.it  This test is not yet approved or cleared by the [place] FDA and has been authorized for detection and/or diagnosis of SARS-CoV-2 by FDA under an Emergency Use Authorization (EUA). This EUA will remain in effect (meaning this test can be used) for the duration of the COVID-19 declaration under Section 564(b)(1) of the Act, 21 U.S.C. section 360bbb-3(b)(1), unless the authorization is terminated or revoked.  Performed at [facility], [address], [place], [place] [address]          Radiology Studies: No results found.      Scheduled Meds:  acetylcysteine  4 mL Nebulization BID   amiodarone  400 mg Oral Daily   apixaban  5 mg Oral BID   arformoterol  15 mcg Nebulization BID   atorvastatin  20 mg Oral Daily   budesonide (PULMICORT) nebulizer solution  0.25 mg Nebulization BID   carbidopa-levodopa  1 tablet Oral TID AC   escitalopram  5 mg Oral Daily   feeding supplement (GLUCERNA SHAKE)  237 mL Oral BID BM   ferrous sulfate  325 mg Oral TID WC   finasteride  5 mg Oral Daily   fluticasone  1 spray Each Nare Daily   gabapentin  800 mg Oral BID    guaiFENesin  600 mg Oral BID   insulin aspart  0-15 Units Subcutaneous TID AC & HS   insulin aspart  3 Units Subcutaneous TID WC   insulin glargine-yfgn  12 Units Subcutaneous Daily   ipratropium-albuterol  3 mL Nebulization Q6H   lactase  3,000 Units Oral TID WC   methocarbamol  1,000 mg Oral BID   metoprolol tartrate  100 mg Oral BID   Muscle Rub  1 application Topical TID   oxybutynin  10 mg Oral Daily   pantoprazole  40 mg Oral BID AC   polyethylene glycol  17 g Oral Daily   predniSONE  40 mg Oral Q breakfast   psyllium  1 packet Oral Daily   senna-docusate  1 tablet Oral QHS   torsemide  10 mg Oral Daily   traZODone  100 mg Oral QHS   Continuous Infusions:  promethazine (PHENERGAN) injection (IM or IVPB) 200 mL/hr at 01/13/21 0600     LOS: 12 days  Time spent: 25 minutes    Tresa Moore, MD [facility]   If 7PM-7AM, please contact night-coverage  01/21/2021, 10:05 AM

## 2021-01-22 DIAGNOSIS — I480 Paroxysmal atrial fibrillation: Secondary | ICD-10-CM | POA: Diagnosis not present

## 2021-01-22 DIAGNOSIS — J441 Chronic obstructive pulmonary disease with (acute) exacerbation: Secondary | ICD-10-CM | POA: Diagnosis not present

## 2021-01-22 LAB — GLUCOSE, CAPILLARY
Glucose-Capillary: 125 mg/dL — ABNORMAL HIGH (ref 70–99)
Glucose-Capillary: 172 mg/dL — ABNORMAL HIGH (ref 70–99)
Glucose-Capillary: 226 mg/dL — ABNORMAL HIGH (ref 70–99)

## 2021-01-22 LAB — CBC
HCT: 41.6 % (ref 39.0–52.0)
Hemoglobin: 13.7 g/dL (ref 13.0–17.0)
MCH: 30.8 pg (ref 26.0–34.0)
MCHC: 32.9 g/dL (ref 30.0–36.0)
MCV: 93.5 fL (ref 80.0–100.0)
Platelets: 272 10*3/uL (ref 150–400)
RBC: 4.45 MIL/uL (ref 4.22–5.81)
RDW: 18.7 % — ABNORMAL HIGH (ref 11.5–15.5)
WBC: 13.5 10*3/uL — ABNORMAL HIGH (ref 4.0–10.5)
nRBC: 0.7 % — ABNORMAL HIGH (ref 0.0–0.2)

## 2021-01-22 LAB — RESP PANEL BY RT-PCR (FLU A&B, COVID) ARPGX2
Influenza A by PCR: NEGATIVE
Influenza B by PCR: NEGATIVE
SARS Coronavirus 2 by RT PCR: NEGATIVE

## 2021-01-22 MED ORDER — COVID-19MRNA BIVAL VACC PFIZER 30 MCG/0.3ML IM SUSP
0.3000 mL | Freq: Once | INTRAMUSCULAR | Status: AC
  Administered 2021-01-22: 0.3 mL via INTRAMUSCULAR
  Filled 2021-01-22: qty 0.3

## 2021-01-22 MED ORDER — METOPROLOL TARTRATE 100 MG PO TABS: 100.0000 mg | ORAL_TABLET | Freq: Two times a day (BID) | ORAL | Status: DC

## 2021-01-22 MED ORDER — AMIODARONE HCL 400 MG PO TABS: 400.0000 mg | ORAL_TABLET | Freq: Every day | ORAL | Status: DC

## 2021-01-22 MED ORDER — TRAMADOL HCL 50 MG PO TABS: 100.0000 mg | ORAL_TABLET | Freq: Three times a day (TID) | ORAL | 0 refills | Status: AC | PRN

## 2021-01-22 MED ORDER — INFLUENZA VAC A&B SA ADJ QUAD 0.5 ML IM PRSY
0.5000 mL | PREFILLED_SYRINGE | INTRAMUSCULAR | Status: AC
  Administered 2021-01-22: 0.5 mL via INTRAMUSCULAR
  Filled 2021-01-22: qty 0.5

## 2021-01-22 MED ORDER — PREDNISONE 20 MG PO TABS: 40.0000 mg | ORAL_TABLET | Freq: Every day | ORAL | 0 refills | Status: AC

## 2021-01-22 MED ORDER — GUAIFENESIN ER 600 MG PO TB12: 600.0000 mg | ORAL_TABLET | Freq: Two times a day (BID) | ORAL | Status: DC

## 2021-01-22 NOTE — Discharge Summary (Signed)
Physician Discharge Summary  Raymond Hays NWG:[id number] DOB: 11-Dec-1945 DOA: 01/09/2021  PCP: [facility]  Admit date: 01/09/2021 Discharge date: 01/22/2021  Admitted From: ALF Disposition:  SNF  Recommendations for Outpatient Follow-up:  Follow up with PCP in 1-2 weeks   Home Health: No Equipment/Devices:None  Discharge Condition:Stable CODE STATUS:Full Diet recommendation: Carb modified  Brief/Interim Summary: 75 y.o. male with past medical history significant for COPD, type II DM, paroxysmal atrial fibrillation, dyslipidemia, osteoarthritis, hypertension, obesity, who presented to the hospital because of worsening shortness of breath.  His oxygen saturation was in the mid 80s when EMS picked him up.  He was placed on BiPAP and was transported to the emergency room for further evaluation.   He was admitted to the hospital for acute hypoxemic respiratory failure and COPD exacerbation.  He was also treated with IV Lasix for suspected acute diastolic CHF.  He developed atrial fibrillation with RVR rapid ventricular response requiring treatment with IV Cardizem infusion followed by IV amiodarone.   Heart rate is improved.  Patient remains on metoprolol and p.o. Cardizem.  Respiratory status optimized as much as possible.  Patient on RA.  Stable for discharge to SNF    Discharge Diagnoses:  Active Problems:   AF (paroxysmal atrial fibrillation) (HCC)   COPD with acute exacerbation (HCC)   Acute respiratory failure (HCC)  Acute exacerbation COPD No evidence of infiltrate on x-ray Improving with chest PT and nebulizer Unable to clear phlegm Mucomyst inhaler added 10/21, good result Plan: Resume PTA inhaler regimen.  5 day course prednisone prescribed.     Acute on chronic diastolic congestive heart failure Mild exacerbation Volume patient seen and examined.  Cough and shortness of breath are improving but not yet at baseline.  Status optimized with use of IV  diuretic Plan: Continue PTA torsemide 10 mg daily Outpatient cardiology follow-up   Atrial fibrillation with rapid ventricular response Heart rate improved Continue oral amiodarone and metoprolol Continue Eliquis for VTE prophylaxis Outpatient cardiology follow-up Oral medications prescribed on DC   Elevated troponin ACS ruled out   Hypertension Continue metoprolol   Intractable nausea and vomiting Resolved   AKI Resolved   Leaking supra pubic catheter Seen by urology Leaking stopped Outpatient urology follow-up [facility] with [facility] for SP tube change in 1 week  Discharge Instructions  Discharge Instructions     Diet - low sodium heart healthy   Complete by: As directed    Increase activity slowly   Complete by: As directed       Allergies as of 01/22/2021   No Known Allergies      Medication List     STOP taking these medications    Benadryl Itch Stopping cream Generic drug: diphenhydrAMINE-zinc acetate   carboxymethylcellulose 0.5 % Soln Commonly known as: REFRESH PLUS   cetirizine 10 MG tablet Commonly known as: ZYRTEC   clobetasol cream 0.05 % Commonly known as: TEMOVATE   cyclobenzaprine 5 MG tablet Commonly known as: FLEXERIL   diclofenac Sodium 1 % Gel Commonly known as: VOLTAREN   heparin flush 10 UNIT/ML Soln injection   Lidocaine 3 % Crea   metoprolol succinate 50 MG 24 hr tablet Commonly known as: TOPROL-XL   montelukast 10 MG tablet Commonly known as: SINGULAIR   nystatin powder Commonly known as: MYCOSTATIN/NYSTOP   senna-docusate 8.6-50 MG tablet Commonly known as: Senokot-S   sulfamethoxazole-trimethoprim 800-160 MG tablet Commonly known as: BACTRIM DS   tamsulosin 0.4 MG Caps capsule  Commonly known as: FLOMAX       TAKE these medications    acetaminophen 325 MG tablet Commonly known as: TYLENOL Take 650 mg by mouth every 4 (four) hours as needed.   Albuterol Sulfate 108 (90  Base) MCG/ACT Aepb Commonly known as: PROAIR RESPICLICK Inhale 2 puffs into the lungs every 4 (four) hours as needed (COPD).   amiodarone 400 MG tablet Commonly known as: PACERONE Take 1 tablet (400 mg total) by mouth daily. Start taking on: January 23, 2021   Anti-Dandruff 1 % Lotn Generic drug: selenium sulfide Apply topically.   apixaban 5 MG Tabs tablet Commonly known as: ELIQUIS Take 5 mg by mouth 2 (two) times daily.   atorvastatin 20 MG tablet Commonly known as: LIPITOR Take 20 mg by mouth daily.   Biofreeze 4 % Gel Generic drug: Menthol (Topical Analgesic) Apply 1 application topically 3 (three) times daily.   bisacodyl 10 MG suppository Commonly known as: DULCOLAX Place 10 mg rectally as needed for moderate constipation.   calcium carbonate 500 MG chewable tablet Commonly known as: TUMS - dosed in mg elemental calcium Chew 2 tablets by mouth every 6 (six) hours as needed for indigestion or heartburn.   carbidopa-levodopa 25-100 MG tablet Commonly known as: SINEMET IR Take 1 tablet by mouth 3 (three) times daily before meals. At 0630, 1130, 1630   cetaphil cream Apply 1 application topically as needed. What changed: Another medication with the same name was removed. Continue taking this medication, and follow the directions you see here.   escitalopram 5 MG tablet Commonly known as: LEXAPRO Take 5 mg by mouth daily.   feeding supplement (GLUCERNA SHAKE) Liqd Take 237 mLs by mouth 2 (two) times daily between meals.   ferrous sulfate 325 (65 FE) MG tablet Take 1 tablet (325 mg total) by mouth 3 (three) times daily with meals.   finasteride 5 MG tablet Commonly known as: PROSCAR Take 5 mg by mouth daily.   fluticasone 50 MCG/ACT nasal spray Commonly known as: FLONASE Place 1 spray into both nostrils daily.   gabapentin 800 MG tablet Commonly known as: NEURONTIN Take 800 mg by mouth 2 (two) times daily.   guaiFENesin 600 MG 12 hr tablet Commonly  known as: MUCINEX Take 1 tablet (600 mg total) by mouth 2 (two) times daily.   hydrOXYzine 25 MG tablet Commonly known as: ATARAX/VISTARIL Take 25 mg by mouth 2 (two) times daily as needed for itching.   ipratropium 0.03 % nasal spray Commonly known as: ATROVENT Place 2 sprays into both nostrils 2 (two) times daily.   ipratropium-albuterol 0.5-2.5 (3) MG/3ML Soln Commonly known as: DUONEB Inhale 3 mLs into the lungs daily. And every 6 hours as needed for shortness of breath or wheezing   lactase 3000 units tablet Commonly known as: LACTAID Take 3,000 Units by mouth 3 (three) times daily with meals.   metFORMIN 500 MG 24 hr tablet Commonly known as: GLUCOPHAGE-XR Take 500 mg by mouth daily.   methocarbamol 500 MG tablet Commonly known as: ROBAXIN Take 1,000 mg by mouth in the morning and at bedtime.   metoprolol tartrate 100 MG tablet Commonly known as: LOPRESSOR Take 1 tablet (100 mg total) by mouth 2 (two) times daily.   MINERIN CREME EX Apply topically. Apply to feet twice bid   oxybutynin 10 MG 24 hr tablet Commonly known as: DITROPAN-XL Take 1 tablet (10 mg total) by mouth daily.   pantoprazole 40 MG tablet Commonly known as: PROTONIX Take  1 tablet (40 mg total) by mouth 2 (two) times daily before a meal. For 8 weeks.   polyethylene glycol 17 g packet Commonly known as: MIRALAX / GLYCOLAX Take 17 g by mouth daily.   predniSONE 20 MG tablet Commonly known as: DELTASONE Take 2 tablets (40 mg total) by mouth daily with breakfast for 5 days. Start taking on: January 23, 2021 What changed: how much to take   psyllium 58.6 % powder Commonly known as: METAMUCIL Take 1 packet by mouth daily.   Sarna lotion Generic drug: camphor-menthol Apply topically.   simethicone 125 MG chewable tablet Commonly known as: MYLICON Chew 125 mg by mouth 3 (three) times daily.   STOOL SOFTENER LAXATIVE PO Take 2 tablets by mouth at bedtime.   torsemide 10 MG  tablet Commonly known as: DEMADEX Take 10 mg by mouth daily.   traMADol 50 MG tablet Commonly known as: ULTRAM Take 2 tablets (100 mg total) by mouth every 8 (eight) hours as needed for up to 3 days for moderate pain. For SNF use only.  Refills to be considered by SNF provider What changed: additional instructions   traZODone 100 MG tablet Commonly known as: DESYREL Take 100 mg by mouth at bedtime.   Trelegy Ellipta 100-62.5-25 MCG/ACT Aepb Generic drug: Fluticasone-Umeclidin-Vilant Inhale 1 puff into the lungs daily.   triamcinolone cream 0.1 % Commonly known as: KENALOG Apply 1 application topically 2 (two) times daily.        No Known Allergies  Consultations: Urology Cardiology   Procedures/Studies: CT ABDOMEN PELVIS WO CONTRAST  Result Date: 01/10/2021 CLINICAL DATA:  Nausea, vomiting, upper abdominal pain EXAM: CT ABDOMEN AND PELVIS WITHOUT CONTRAST TECHNIQUE: Multidetector CT imaging of the abdomen and pelvis was performed following the standard protocol without IV contrast. COMPARISON:  CT abdomen/pelvis 10/23/2020 FINDINGS: Lower chest: There is a small left pleural effusion. There is dependent subsegmental atelectasis in both lung bases. Mitral annular calcifications, aortic valve calcifications, and coronary artery calcifications are seen. There is no pericardial effusion. Hepatobiliary: A hypodense lesion in the right hepatic lobe near the dome is unchanged, likely a cyst. There are no other focal lesions, within the confines of noncontrast technique. There is cholelithiasis without evidence of acute cholecystitis. There is no biliary ductal dilatation. Pancreas: Unremarkable. Spleen: Unremarkable. Adrenals/Urinary Tract: The adrenals are unremarkable. Bilateral renal parenchymal atrophy is again seen. A 2.2 cm hypodense lesion adjacent to the posterior left renal cortex is unchanged, likely an exophytic cyst. Additional subcentimeter hypodense lesions in the kidneys  are too small to characterize but likely reflect additional small cysts. There is a punctate nonobstructing left lower pole stone, unchanged. There are no other calculi. There is no hydronephrosis or hydroureter. The bladder is decompressed by a suprapubic catheter and not well assessed. Stomach/Bowel: There is an enteric catheter in place with the tip in the stomach. The stomach is otherwise unremarkable. There is no evidence of bowel obstruction. There is a moderate stool burden in the rectum, similar to the prior study. There is no finding to suggest stercoral colitis. There is colonic diverticulosis without evidence of acute diverticulitis. Vascular/Lymphatic: There is scattered calcified atherosclerotic plaque in the nonaneurysmal abdominal aorta. There is no portal venous gas. There is no abdominal or pelvic lymphadenopathy. Reproductive: The prostate and seminal vesicles are not well assessed. Other: There is no ascites or free air. Musculoskeletal: Bilateral hip arthroplasty hardware is noted. The hardware is grossly within expected limits to the level imaged. There is advanced multilevel degenerative  change of the lumbar spine with fusion of the L4 and L5 vertebral bodies, vacuum disc phenomenon at L2-L3, L3-L4, and L5-S1, and flowing anterior osteophytes in the lower thoracic spine. There are postsurgical changes reflecting decompressive laminectomies at L3 through L5. The findings are overall unchanged. There is no acute osseous abnormality or aggressive osseous lesion. IMPRESSION: 1. No acute findings in the abdomen or pelvis. 2. Cholelithiasis without evidence of acute cholecystitis. 3. Diverticulosis without evidence of acute diverticulitis. 4. Moderate stool burden in the rectum without evidence of stercoral colitis. 5. Unchanged punctate nonobstructing left lower pole renal stone. Aortic Atherosclerosis (ICD10-I70.0). Electronically Signed   By: Lesia Hausen M.D.   On: 01/10/2021 16:09   DG Abdomen  1 View  Result Date: 01/10/2021 CLINICAL DATA:  NG tube placement EXAM: ABDOMEN - 1 VIEW COMPARISON:  KUB obtained earlier the same day FINDINGS: The enteric catheter tip projects at the level of the GE junction. The side hole projects over the distal esophagus. Gaseous distention of the stomach and bowel in the imaged upper abdomen is not significantly changed. IMPRESSION: The enteric catheter tip projects at the level of the GE junction, and the side hole projects over the distal esophagus. Recommend advancement by approximately 7 cm. These results will be called to the ordering clinician or representative by the Radiologist Assistant, and communication documented in the PACS or [employer]. Electronically Signed   By: Lesia Hausen M.D.   On: 01/10/2021 12:23   DG Abd 1 View  Result Date: 01/10/2021 CLINICAL DATA:  Abdominal pain EXAM: ABDOMEN - 1 VIEW COMPARISON:  None. FINDINGS: There is marked gaseous distension of the stomach. There is milder distension of the small bowel and colon. No acute osseous abnormality. Bilateral hip arthroplasties. IMPRESSION: Marked gaseous distension of the stomach and milder distension of the small bowel and colon. Patient may benefit from a nasogastric tube. Electronically Signed   By: Caprice Renshaw M.D.   On: 01/10/2021 10:32   CT Angio Chest PE W and/or Wo Contrast  Result Date: 01/09/2021 CLINICAL DATA:  Wheezing and congestion. Atrial fibrillation. Hypoxia. Hypotension. COPD. EXAM: CT ANGIOGRAPHY CHEST WITH CONTRAST TECHNIQUE: Multidetector CT imaging of the chest was performed using the standard protocol during bolus administration of intravenous contrast. Multiplanar CT image reconstructions and MIPs were obtained to evaluate the vascular anatomy. CONTRAST:  75mL OMNIPAQUE IOHEXOL 350 MG/ML SOLN COMPARISON:  Multiple exams, including CT chest 10/16/2020 FINDINGS: Cardiovascular: Marginal irregularities simulating chronic pulmonary embolus in the left upper  lobe pulmonary artery on images 74 through 100 of series 6 correlate with substantial motion artifact and accordingly are likely artifactual in nature. No definite filling defect characteristic for acute pulmonary embolus is identified in the pulmonary arterial tree. Moderate cardiomegaly. Coronary, aortic arch, and branch vessel atherosclerotic vascular disease. Mitral valve clips noted. Mediastinum/Nodes: Prominence of adipose tissues in the upper mediastinum. Suspected fluid in the superior pericardial recess. 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 Dependent atelectasis in both lower lobes. Mild dependent atelectasis in the lingula. Trace left pleural effusion. Upper Abdomen: Stable hypodense 2.0 by 1.6 cm lesion in the right hepatic lobe on image 71 series 5. Dependent densities in the gallbladder suggesting cholelithiasis. Musculoskeletal: Prominent bilateral rotator cuff muscular atrophy. Substantial bilateral degenerative glenohumeral arthropathy with calcifications in the joint favoring chondrocalcinosis. Substantial bilateral sternoclavicular joint arthropathy. Scattered mild intramedullary sclerosis along some ribs, probably incidental. Old deformity of the left medial eleventh rib from an old healed fracture. Thoracic  spondylosis. Lower cervical plate and screw fixator. Prominent multilevel thoracic degenerative disc disease multilevel impingement due to spurring. Review of the MIP images confirms the above findings. IMPRESSION: 1. No filling defect is identified in the pulmonary arterial tree to suggest pulmonary embolus. Reduced sensitivity T2 the degree of motion artifact. 2. 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 3. Mild atelectasis in both lower lobes and in the lingula. 4. Trace left pleural effusion. 5. Cardiomegaly with coronary and aortic atherosclerosis. Mitral valve clips noted.  Aortic Atherosclerosis (ICD10-I70.0). 6. Substantial bilateral glenohumeral arthropathy. Prominent bilateral rotator cuff muscular atrophy. 7. Multilevel thoracic spine impingement due to spurring. Electronically Signed   By: Gaylyn Rong M.D.   On: 01/09/2021 18:38   US Venous Img Lower Bilateral (DVT)  Result Date: 01/10/2021 CLINICAL DATA:  Lower extremity edema EXAM: BILATERAL LOWER EXTREMITY VENOUS DOPPLER ULTRASOUND TECHNIQUE: Gray-scale sonography with compression, as well as color and duplex ultrasound, were performed to evaluate the deep venous system(s) from the level of the common femoral vein through the popliteal and proximal calf veins. COMPARISON:  None. FINDINGS: VENOUS Normal compressibility of the common femoral, superficial femoral, and popliteal veins, as well as the visualized calf veins. Visualized portions of profunda femoral vein and great saphenous vein unremarkable. No filling defects to suggest DVT on grayscale or color Doppler imaging. Doppler waveforms show normal direction of venous flow, normal respiratory plasticity and response to augmentation. Limited views of the contralateral common femoral vein are unremarkable. OTHER None. Limitations: none IMPRESSION: Negative. Electronically Signed   By: Deatra Robinson M.D.   On: 01/10/2021 02:12   DG Chest Port 1 View  Result Date: 01/12/2021 CLINICAL DATA:  Cough.  COPD.  Diabetes. EXAM: PORTABLE CHEST 1 VIEW COMPARISON:  01/09/2021 FINDINGS: Cervical spine fixation. Apical lordotic positioning. Patient rotated left. Cardiomegaly accentuated by AP portable technique. Atherosclerosis in the transverse aorta. No pleural effusion or pneumothorax. Low lung volumes with resultant pulmonary interstitial prominence. No overt congestive failure. Numerous leads and wires project over the chest. No lobar consolidation. IMPRESSION: Cardiomegaly and low lung volumes.  No acute findings. Aortic Atherosclerosis (ICD10-I70.0). Electronically  Signed   By: Jeronimo Greaves M.D.   On: 01/12/2021 16:31   DG Chest Portable 1 View  Result Date: 01/09/2021 CLINICAL DATA:  Shortness of breath, wheezing, congestion EXAM: PORTABLE CHEST 1 VIEW COMPARISON:  Chest radiograph 12/04/2020 FINDINGS: The heart appears mildly enlarged, though this is likely exaggerated by AP technique and low lung volumes. The mediastinal contours are stable. There is calcified atherosclerotic plaque of the aortic arch. Lung volumes are low, with unchanged asymmetric elevation of the right hemidiaphragm. Linear opacities in the lateral left base likely reflect atelectasis or scar. There is no other focal consolidation. There is no pulmonary edema. There is no definite pneumothorax, though the apices are suboptimally evaluated. There is no acute osseous abnormality. IMPRESSION: Low lung volumes. Otherwise, no radiographic evidence of acute cardiopulmonary process. Electronically Signed   By: Lesia Hausen M.D.   On: 01/09/2021 16:24      Subjective: Seen and examined on the day of dc.  Stable, in no distress  Discharge Exam: Vitals:   01/22/21 0551 01/22/21 0759  BP: (!) 142/94 (!) 152/139  Pulse: 72 75  Resp: 18 16  Temp: 98.1 F (36.7 C) 98.3 F (36.8 C)  SpO2: 99% 100%   Vitals:   01/21/21 2148 01/22/21 0500 01/22/21 0551 01/22/21 0759  BP:   (!) 142/94 (!) 152/139  Pulse:  72 75  Resp:   18 16  Temp:   98.1 F (36.7 C) 98.3 F (36.8 C)  TempSrc:   Oral   SpO2: 95%  99% 100%  Weight:  104.5 kg    Height:        General: Pt is alert, awake, not in acute distress Cardiovascular: RRR, S1/S2 +, no rubs, no gallops Respiratory: BL rhonchi, normal WOB, room air Abdominal: Soft, NT, ND, bowel sounds + Extremities: no edema, no cyanosis    The results of significant diagnostics from this hospitalization (including imaging, microbiology, ancillary and laboratory) are listed below for reference.     Microbiology: Recent Results (from the past 240  hour(s))  MRSA Next Gen by PCR, Nasal     Status: Abnormal   Collection Time: 01/12/21 12:00 PM   Specimen: Nasal Mucosa; Nasal Swab  Result Value Ref Range Status   MRSA by PCR Next Gen DETECTED (A) NOT DETECTED Final    Comment: RESULT CALLED TO, READ BACK BY AND VERIFIED WITH: JAMIE EANES 01/12/21 1326 SLM (NOTE) The GeneXpert MRSA Assay (FDA approved for NASAL specimens only), is one component of a comprehensive MRSA colonization surveillance program. It is not intended to diagnose MRSA infection nor to guide or monitor treatment for MRSA infections. Test performance is not FDA approved in patients less than 76 years old. Performed at [facility], [address] [place], [place] [address]   Resp Panel by RT-PCR (Flu A&B, Covid) Nasopharyngeal Swab     Status: None   Collection Time: 01/18/21  3:26 PM   Specimen: Nasopharyngeal Swab; Nasopharyngeal(NP) swabs in vial transport medium  Result Value Ref Range Status   SARS Coronavirus 2 by RT PCR NEGATIVE NEGATIVE Final    Comment: (NOTE) SARS-CoV-2 target nucleic acids are NOT DETECTED.  The SARS-CoV-2 RNA is generally detectable in upper respiratory specimens during the acute phase of infection. The lowest concentration of SARS-CoV-2 viral copies this assay can detect is 138 copies/mL. A negative result does not preclude SARS-Cov-2 infection and should not be used as the sole basis for treatment or other patient management decisions. A negative result may occur with  improper specimen collection/handling, submission of specimen other than nasopharyngeal swab, presence of viral mutation(s) within the areas targeted by this assay, and inadequate number of viral copies(<138 copies/mL). A negative result must be combined with clinical observations, patient history, and epidemiological information. The expected result is Negative.  Fact Sheet for Patients:  BloggerCourse.com  Fact Sheet for  Healthcare Providers:  SeriousBroker.it  This test is no t yet approved or cleared by the [place] FDA and  has been authorized for detection and/or diagnosis of SARS-CoV-2 by FDA under an Emergency Use Authorization (EUA). This EUA will remain  in effect (meaning this test can be used) for the duration of the COVID-19 declaration under Section 564(b)(1) of the Act, 21 U.S.C.section 360bbb-3(b)(1), unless the authorization is terminated  or revoked sooner.       Influenza A by PCR NEGATIVE NEGATIVE Final   Influenza B by PCR NEGATIVE NEGATIVE Final    Comment: (NOTE) The Xpert Xpress SARS-CoV-2/FLU/RSV plus assay is intended as an aid in the diagnosis of influenza from Nasopharyngeal swab specimens and should not be used as a sole basis for treatment. Nasal washings and aspirates are unacceptable for Xpert Xpress SARS-CoV-2/FLU/RSV testing.  Fact Sheet for Patients: BloggerCourse.com  Fact Sheet for Healthcare Providers: SeriousBroker.it  This test is not yet approved or cleared by the  [place] [employer] and has been authorized for detection and/or diagnosis of SARS-CoV-2 by FDA under an TEFL teacher (EUA). This EUA will remain in effect (meaning this test can be used) for the duration of the COVID-19 declaration under Section 564(b)(1) of the Act, 21 U.S.C. section 360bbb-3(b)(1), unless the authorization is terminated or revoked.  Performed at [facility], [address] [place], [place] [address]      Labs: BNP (last 3 results) Recent Labs    01/09/21 1547  BNP 263.7*   Basic Metabolic Panel: Recent Labs  Lab 01/17/21 0311  NA 137  K 4.1  CL 103  CO2 27  GLUCOSE 255*  BUN 28*  CREATININE 1.03  CALCIUM 8.7*   Liver Function Tests: No results for input(s): AST, ALT, ALKPHOS, BILITOT, PROT, ALBUMIN in the last 168 hours. No results for input(s):  LIPASE, AMYLASE in the last 168 hours. No results for input(s): AMMONIA in the last 168 hours. CBC: Recent Labs  Lab 01/22/21 0626  WBC 13.5*  HGB 13.7  HCT 41.6  MCV 93.5  PLT 272   Cardiac Enzymes: No results for input(s): CKTOTAL, CKMB, CKMBINDEX, TROPONINI in the last 168 hours. BNP: Invalid input(s): POCBNP CBG: Recent Labs  Lab 01/21/21 0846 01/21/21 1248 01/21/21 1652 01/21/21 2132 01/22/21 0801  GLUCAP 251* 176* 275* 330* 125*   D-Dimer No results for input(s): DDIMER in the last 72 hours. Hgb A1c No results for input(s): HGBA1C in the last 72 hours. Lipid Profile No results for input(s): CHOL, HDL, LDLCALC, TRIG, CHOLHDL, LDLDIRECT in the last 72 hours. Thyroid function studies No results for input(s): TSH, T4TOTAL, T3FREE, THYROIDAB in the last 72 hours.  Invalid input(s): FREET3 Anemia work up No results for input(s): VITAMINB12, FOLATE, FERRITIN, TIBC, IRON, RETICCTPCT in the last 72 hours. Urinalysis    Component Value Date/Time   COLORURINE YELLOW (A) 10/16/2020 1536   APPEARANCEUR HAZY (A) 10/16/2020 1536   LABSPEC 1.018 10/16/2020 1536   PHURINE 5.0 10/16/2020 1536   GLUCOSEU NEGATIVE 10/16/2020 1536   HGBUR MODERATE (A) 10/16/2020 1536   BILIRUBINUR NEGATIVE 10/16/2020 1536   KETONESUR NEGATIVE 10/16/2020 1536   PROTEINUR NEGATIVE 10/16/2020 1536   NITRITE NEGATIVE 10/16/2020 1536   LEUKOCYTESUR NEGATIVE 10/16/2020 1536   Sepsis Labs Invalid input(s): PROCALCITONIN,  WBC,  LACTICIDVEN Microbiology Recent Results (from the past 240 hour(s))  MRSA Next Gen by PCR, Nasal     Status: Abnormal   Collection Time: 01/12/21 12:00 PM   Specimen: Nasal Mucosa; Nasal Swab  Result Value Ref Range Status   MRSA by PCR Next Gen DETECTED (A) NOT DETECTED Final    Comment: RESULT CALLED TO, READ BACK BY AND VERIFIED WITH: JAMIE EANES 01/12/21 1326 SLM (NOTE) The GeneXpert MRSA Assay (FDA approved for NASAL specimens only), is one component of a  comprehensive MRSA colonization surveillance program. It is not intended to diagnose MRSA infection nor to guide or monitor treatment for MRSA infections. Test performance is not FDA approved in patients less than 77 years old. Performed at [facility], [address] [place], [place] [address]   Resp Panel by RT-PCR (Flu A&B, Covid) Nasopharyngeal Swab     Status: None   Collection Time: 01/18/21  3:26 PM   Specimen: Nasopharyngeal Swab; Nasopharyngeal(NP) swabs in vial transport medium  Result Value Ref Range Status   SARS Coronavirus 2 by RT PCR NEGATIVE NEGATIVE Final    Comment: (NOTE) SARS-CoV-2 target nucleic acids are NOT DETECTED.  The SARS-CoV-2 RNA is generally detectable in upper respiratory specimens during the acute phase of infection. The lowest concentration of SARS-CoV-2 viral copies this assay can detect is 138 copies/mL. A negative result does not preclude SARS-Cov-2 infection and should not be used as the sole basis for treatment or other patient management decisions. A negative result may occur with  improper specimen collection/handling, submission of specimen other than nasopharyngeal swab, presence of viral mutation(s) within the areas targeted by this assay, and inadequate number of viral copies(<138 copies/mL). A negative result must be combined with clinical observations, patient history, and epidemiological information. The expected result is Negative.  Fact Sheet for Patients:  BloggerCourse.com  Fact Sheet for Healthcare Providers:  SeriousBroker.it  This test is no t yet approved or cleared by the [place] FDA and  has been authorized for detection and/or diagnosis of SARS-CoV-2 by FDA under an Emergency Use Authorization (EUA). This EUA will remain  in effect (meaning this test can be used) for the duration of the COVID-19 declaration under Section 564(b)(1) of the Act,  21 U.S.C.section 360bbb-3(b)(1), unless the authorization is terminated  or revoked sooner.       Influenza A by PCR NEGATIVE NEGATIVE Final   Influenza B by PCR NEGATIVE NEGATIVE Final    Comment: (NOTE) The Xpert Xpress SARS-CoV-2/FLU/RSV plus assay is intended as an aid in the diagnosis of influenza from Nasopharyngeal swab specimens and should not be used as a sole basis for treatment. Nasal washings and aspirates are unacceptable for Xpert Xpress SARS-CoV-2/FLU/RSV testing.  Fact Sheet for Patients: BloggerCourse.com  Fact Sheet for Healthcare Providers: SeriousBroker.it  This test is not yet approved or cleared by the [place] FDA and has been authorized for detection and/or diagnosis of SARS-CoV-2 by FDA under an Emergency Use Authorization (EUA). This EUA will remain in effect (meaning this test can be used) for the duration of the COVID-19 declaration under Section 564(b)(1) of the Act, 21 U.S.C. section 360bbb-3(b)(1), unless the authorization is terminated or revoked.  Performed at [facility], [address], [place], [place] [address]      Time coordinating discharge: Over 30 minutes  SIGNED:   Tresa Moore, MD  [facility] 01/22/2021, 8:37 AM Pager   If 7PM-7AM, please contact night-coverage

## 2021-01-22 NOTE — TOC Transition Note (Signed)
Transition of Care [facility]) - CM/SW Discharge Note   Patient Details  Name: Raymond Hays MRN: [id number] Date of Birth: 1946-03-24  [facility] CM/SW Contact:  Chapman Fitch, RN Phone Number: 01/22/2021, 1:38 PM   Clinical Narrative:     Patient will DC to:[facility] Anticipated DC date:01/22/21  Family notified:Patient to notify sister Transport by:EMS  Per MD patient ready for DC to . RN, patient, patient's family, and facility notified of DC. Discharge Summary sent to facility. RN given number for report. DC packet on chart. Ambulance transport requested for patient.  TOC signing off.  Bevelyn Ngo [facility] [phone]   Final next level of care: Skilled Nursing Facility Barriers to Discharge: Barriers Resolved   Patient Goals and CMS Choice Patient states their goals for this hospitalization and ongoing recovery are:: return to [facility].gov Compare Post Acute Care list provided to:: Patient Choice offered to / list presented to : Patient  Discharge Placement              Patient chooses bed at: [facility] Patient to be transferred to facility by: EMS      Discharge Plan and Services                                     Social Determinants of Health (SDOH) Interventions     Readmission Risk Interventions Readmission Risk Prevention Plan 01/12/2021 10/20/2020  Transportation Screening Complete Complete  PCP or Specialist Appt within 3-5 Days - Complete  HRI or Home Care Consult - Complete  Palliative Care Screening - Not Applicable  Medication Review (RN Care Manager) Complete Referral to Pharmacy  PCP or Specialist appointment within 3-5 days of discharge Complete -  HRI or Home Care Consult Complete -  SW Recovery Care/Counseling Consult Complete -  Palliative Care Screening Not Applicable -  Skilled Nursing Facility Complete -

## 2021-01-29 ENCOUNTER — Emergency Department: Payer: No Typology Code available for payment source

## 2021-01-29 ENCOUNTER — Other Ambulatory Visit: Payer: Self-pay

## 2021-01-29 ENCOUNTER — Encounter: Payer: Self-pay | Admitting: Emergency Medicine

## 2021-01-29 DIAGNOSIS — I480 Paroxysmal atrial fibrillation: Secondary | ICD-10-CM | POA: Diagnosis present

## 2021-01-29 DIAGNOSIS — R600 Localized edema: Secondary | ICD-10-CM | POA: Diagnosis present

## 2021-01-29 DIAGNOSIS — Z7901 Long term (current) use of anticoagulants: Secondary | ICD-10-CM

## 2021-01-29 DIAGNOSIS — F419 Anxiety disorder, unspecified: Secondary | ICD-10-CM | POA: Diagnosis present

## 2021-01-29 DIAGNOSIS — R140 Abdominal distension (gaseous): Secondary | ICD-10-CM | POA: Diagnosis not present

## 2021-01-29 DIAGNOSIS — Z87891 Personal history of nicotine dependence: Secondary | ICD-10-CM

## 2021-01-29 DIAGNOSIS — Z7984 Long term (current) use of oral hypoglycemic drugs: Secondary | ICD-10-CM

## 2021-01-29 DIAGNOSIS — L89322 Pressure ulcer of left buttock, stage 2: Secondary | ICD-10-CM | POA: Diagnosis present

## 2021-01-29 DIAGNOSIS — G20 Parkinson's disease: Secondary | ICD-10-CM | POA: Diagnosis present

## 2021-01-29 DIAGNOSIS — Z20822 Contact with and (suspected) exposure to covid-19: Secondary | ICD-10-CM | POA: Diagnosis present

## 2021-01-29 DIAGNOSIS — K5641 Fecal impaction: Secondary | ICD-10-CM | POA: Diagnosis not present

## 2021-01-29 DIAGNOSIS — Z79899 Other long term (current) drug therapy: Secondary | ICD-10-CM

## 2021-01-29 DIAGNOSIS — D509 Iron deficiency anemia, unspecified: Secondary | ICD-10-CM | POA: Diagnosis present

## 2021-01-29 DIAGNOSIS — Z96643 Presence of artificial hip joint, bilateral: Secondary | ICD-10-CM | POA: Diagnosis present

## 2021-01-29 DIAGNOSIS — I10 Essential (primary) hypertension: Secondary | ICD-10-CM | POA: Diagnosis present

## 2021-01-29 DIAGNOSIS — Z7951 Long term (current) use of inhaled steroids: Secondary | ICD-10-CM

## 2021-01-29 DIAGNOSIS — J441 Chronic obstructive pulmonary disease with (acute) exacerbation: Secondary | ICD-10-CM | POA: Diagnosis present

## 2021-01-29 DIAGNOSIS — E1140 Type 2 diabetes mellitus with diabetic neuropathy, unspecified: Secondary | ICD-10-CM | POA: Diagnosis present

## 2021-01-29 DIAGNOSIS — J9601 Acute respiratory failure with hypoxia: Secondary | ICD-10-CM | POA: Diagnosis present

## 2021-01-29 DIAGNOSIS — N179 Acute kidney failure, unspecified: Secondary | ICD-10-CM | POA: Diagnosis present

## 2021-01-29 DIAGNOSIS — E785 Hyperlipidemia, unspecified: Secondary | ICD-10-CM | POA: Diagnosis present

## 2021-01-29 DIAGNOSIS — D696 Thrombocytopenia, unspecified: Secondary | ICD-10-CM | POA: Diagnosis present

## 2021-01-29 DIAGNOSIS — G8929 Other chronic pain: Secondary | ICD-10-CM | POA: Diagnosis present

## 2021-01-29 DIAGNOSIS — L89312 Pressure ulcer of right buttock, stage 2: Secondary | ICD-10-CM | POA: Diagnosis present

## 2021-01-29 LAB — CBC WITH DIFFERENTIAL/PLATELET
Abs Immature Granulocytes: 0.38 10*3/uL — ABNORMAL HIGH (ref 0.00–0.07)
Basophils Absolute: 0 10*3/uL (ref 0.0–0.1)
Basophils Relative: 0 %
Eosinophils Absolute: 0 10*3/uL (ref 0.0–0.5)
Eosinophils Relative: 0 %
HCT: 47 % (ref 39.0–52.0)
Hemoglobin: 16.6 g/dL (ref 13.0–17.0)
Immature Granulocytes: 2 %
Lymphocytes Relative: 8 %
Lymphs Abs: 1.3 10*3/uL (ref 0.7–4.0)
MCH: 32.7 pg (ref 26.0–34.0)
MCHC: 35.3 g/dL (ref 30.0–36.0)
MCV: 92.7 fL (ref 80.0–100.0)
Monocytes Absolute: 1 10*3/uL (ref 0.1–1.0)
Monocytes Relative: 6 %
Neutro Abs: 13.1 10*3/uL — ABNORMAL HIGH (ref 1.7–7.7)
Neutrophils Relative %: 84 %
Platelets: 287 10*3/uL (ref 150–400)
RBC: 5.07 MIL/uL (ref 4.22–5.81)
RDW: 18.6 % — ABNORMAL HIGH (ref 11.5–15.5)
WBC: 15.8 10*3/uL — ABNORMAL HIGH (ref 4.0–10.5)
nRBC: 0.3 % — ABNORMAL HIGH (ref 0.0–0.2)

## 2021-01-29 LAB — COMPREHENSIVE METABOLIC PANEL
ALT: 9 U/L (ref 0–44)
AST: 13 U/L — ABNORMAL LOW (ref 15–41)
Albumin: 4 g/dL (ref 3.5–5.0)
Alkaline Phosphatase: 73 U/L (ref 38–126)
Anion gap: 9 (ref 5–15)
BUN: 47 mg/dL — ABNORMAL HIGH (ref 8–23)
CO2: 26 mmol/L (ref 22–32)
Calcium: 9.2 mg/dL (ref 8.9–10.3)
Chloride: 101 mmol/L (ref 98–111)
Creatinine, Ser: 1.45 mg/dL — ABNORMAL HIGH (ref 0.61–1.24)
GFR, Estimated: 50 mL/min — ABNORMAL LOW (ref >60)
Glucose, Bld: 191 mg/dL — ABNORMAL HIGH (ref 70–99)
Potassium: 4 mmol/L (ref 3.5–5.1)
Sodium: 136 mmol/L (ref 135–145)
Total Bilirubin: 1.4 mg/dL — ABNORMAL HIGH (ref 0.3–1.2)
Total Protein: 7.1 g/dL (ref 6.5–8.1)

## 2021-01-29 LAB — TROPONIN I (HIGH SENSITIVITY): Troponin I (High Sensitivity): 15 ng/L (ref <18)

## 2021-01-29 LAB — BRAIN NATRIURETIC PEPTIDE: B Natriuretic Peptide: 232.9 pg/mL — ABNORMAL HIGH (ref 0.0–100.0)

## 2021-01-29 LAB — LIPASE, BLOOD: Lipase: 30 U/L (ref 11–51)

## 2021-01-29 MED ORDER — ONDANSETRON HCL 4 MG/2ML IJ SOLN
4.0000 mg | Freq: Once | INTRAMUSCULAR | Status: AC
  Administered 2021-01-29: 4 mg via INTRAVENOUS
  Filled 2021-01-29: qty 2

## 2021-01-29 MED ORDER — IPRATROPIUM-ALBUTEROL 0.5-2.5 (3) MG/3ML IN SOLN
3.0000 mL | Freq: Once | RESPIRATORY_TRACT | Status: AC
  Administered 2021-01-30: 3 mL via RESPIRATORY_TRACT
  Filled 2021-01-29: qty 3

## 2021-01-29 MED ORDER — IOHEXOL 300 MG/ML  SOLN
100.0000 mL | Freq: Once | INTRAMUSCULAR | Status: AC | PRN
  Administered 2021-01-29: 100 mL via INTRAVENOUS

## 2021-01-29 NOTE — ED Triage Notes (Signed)
Transferred patient to stretcher for comfort and improved positioning.

## 2021-01-29 NOTE — ED Triage Notes (Addendum)
BIB [facility].  Arrives from [facility] with abdominal pain and distention.  Last BM 3-4 days ago.  VS wnl.  Abdominal distention noted.  WOB increased due to distention

## 2021-01-29 NOTE — ED Provider Notes (Signed)
Emergency Medicine Provider Triage Evaluation Note  Raymond Hays , a 75 y.o. male  was evaluated in triage.  Pt complains of worsening abdominal pain and distension along with vomiting. Additionally reports increasing difficulty breathing. Describes symptoms as similar to recent admission for COPD and CHF.  Review of Systems  Positive: Abdominal pain, nausea and vomiting, shortness of breath. Negative: Chest pain, fever, diarrhea.  Physical Exam  There were no vitals taken for this visit. Gen:   Awake, discomfort secondary to abdominal pain. Resp:  Normal effort, expiratory wheezing noted. MSK:   Moves extremities without difficulty. Other:  Distended abdomen with diffuse tenderness to palpation noted.  Medical Decision Making  Medically screening exam initiated at 5:38 PM.  Appropriate orders placed.  Bianca Vester was informed that the remainder of the evaluation will be completed by another provider, this initial triage assessment does not replace that evaluation, and the importance of remaining in the ED until their evaluation is complete.    Chesley Noon, MD 01/29/21 [phone]

## 2021-01-30 ENCOUNTER — Other Ambulatory Visit: Payer: Self-pay

## 2021-01-30 ENCOUNTER — Encounter: Payer: Self-pay | Admitting: Internal Medicine

## 2021-01-30 ENCOUNTER — Inpatient Hospital Stay
Admission: EM | Admit: 2021-01-30 | Discharge: 2021-02-05 | DRG: 388 | Disposition: A | Discharge status: Skilled Nursing Facility | Payer: No Typology Code available for payment source | Source: Skilled Nursing Facility | Attending: Family Medicine | Admitting: Family Medicine

## 2021-01-30 ENCOUNTER — Observation Stay: Payer: No Typology Code available for payment source

## 2021-01-30 DIAGNOSIS — J441 Chronic obstructive pulmonary disease with (acute) exacerbation: Secondary | ICD-10-CM | POA: Diagnosis present

## 2021-01-30 DIAGNOSIS — I480 Paroxysmal atrial fibrillation: Secondary | ICD-10-CM | POA: Diagnosis present

## 2021-01-30 DIAGNOSIS — I10 Essential (primary) hypertension: Secondary | ICD-10-CM | POA: Diagnosis present

## 2021-01-30 DIAGNOSIS — R600 Localized edema: Secondary | ICD-10-CM | POA: Diagnosis present

## 2021-01-30 DIAGNOSIS — K5900 Constipation, unspecified: Secondary | ICD-10-CM | POA: Diagnosis present

## 2021-01-30 DIAGNOSIS — R140 Abdominal distension (gaseous): Secondary | ICD-10-CM

## 2021-01-30 DIAGNOSIS — E1140 Type 2 diabetes mellitus with diabetic neuropathy, unspecified: Secondary | ICD-10-CM | POA: Diagnosis present

## 2021-01-30 DIAGNOSIS — G8929 Other chronic pain: Secondary | ICD-10-CM | POA: Diagnosis present

## 2021-01-30 DIAGNOSIS — G20A1 Parkinson's disease without dyskinesia, without mention of fluctuations: Secondary | ICD-10-CM | POA: Diagnosis present

## 2021-01-30 DIAGNOSIS — R0602 Shortness of breath: Secondary | ICD-10-CM

## 2021-01-30 DIAGNOSIS — G894 Chronic pain syndrome: Secondary | ICD-10-CM | POA: Diagnosis present

## 2021-01-30 DIAGNOSIS — R609 Edema, unspecified: Secondary | ICD-10-CM

## 2021-01-30 DIAGNOSIS — F419 Anxiety disorder, unspecified: Secondary | ICD-10-CM | POA: Diagnosis present

## 2021-01-30 DIAGNOSIS — K567 Ileus, unspecified: Secondary | ICD-10-CM

## 2021-01-30 DIAGNOSIS — E1142 Type 2 diabetes mellitus with diabetic polyneuropathy: Secondary | ICD-10-CM | POA: Diagnosis present

## 2021-01-30 DIAGNOSIS — J9690 Respiratory failure, unspecified, unspecified whether with hypoxia or hypercapnia: Secondary | ICD-10-CM | POA: Diagnosis present

## 2021-01-30 DIAGNOSIS — D509 Iron deficiency anemia, unspecified: Secondary | ICD-10-CM | POA: Diagnosis present

## 2021-01-30 DIAGNOSIS — N179 Acute kidney failure, unspecified: Secondary | ICD-10-CM | POA: Diagnosis present

## 2021-01-30 DIAGNOSIS — G20 Parkinson's disease: Secondary | ICD-10-CM | POA: Diagnosis present

## 2021-01-30 LAB — CREATININE, SERUM
Creatinine, Ser: 1.24 mg/dL (ref 0.61–1.24)
GFR, Estimated: >60 mL/min (ref >60)

## 2021-01-30 LAB — RESP PANEL BY RT-PCR (FLU A&B, COVID) ARPGX2
Influenza A by PCR: NEGATIVE
Influenza B by PCR: NEGATIVE
SARS Coronavirus 2 by RT PCR: NEGATIVE

## 2021-01-30 LAB — URINALYSIS, COMPLETE (UACMP) WITH MICROSCOPIC
Bacteria, UA: NONE SEEN
Bilirubin Urine: NEGATIVE
Glucose, UA: NEGATIVE mg/dL
Ketones, ur: NEGATIVE mg/dL
Nitrite: NEGATIVE
Protein, ur: NEGATIVE mg/dL
Specific Gravity, Urine: 1.033 — ABNORMAL HIGH (ref 1.005–1.030)
Squamous Epithelial / HPF: NONE SEEN (ref 0–5)
pH: 5 (ref 5.0–8.0)

## 2021-01-30 LAB — CBC
HCT: 43.3 % (ref 39.0–52.0)
Hemoglobin: 14.7 g/dL (ref 13.0–17.0)
MCH: 32 pg (ref 26.0–34.0)
MCHC: 33.9 g/dL (ref 30.0–36.0)
MCV: 94.1 fL (ref 80.0–100.0)
Platelets: 198 10*3/uL (ref 150–400)
RBC: 4.6 MIL/uL (ref 4.22–5.81)
RDW: 18.3 % — ABNORMAL HIGH (ref 11.5–15.5)
WBC: 12.3 10*3/uL — ABNORMAL HIGH (ref 4.0–10.5)
nRBC: 0.3 % — ABNORMAL HIGH (ref 0.0–0.2)

## 2021-01-30 LAB — GLUCOSE, CAPILLARY
Glucose-Capillary: 158 mg/dL — ABNORMAL HIGH (ref 70–99)
Glucose-Capillary: 141 mg/dL — ABNORMAL HIGH (ref 70–99)

## 2021-01-30 LAB — TROPONIN I (HIGH SENSITIVITY): Troponin I (High Sensitivity): 19 ng/L — ABNORMAL HIGH (ref <18)

## 2021-01-30 LAB — LACTIC ACID, PLASMA
Lactic Acid, Venous: 1.7 mmol/L (ref 0.5–1.9)
Lactic Acid, Venous: 1.4 mmol/L (ref 0.5–1.9)

## 2021-01-30 MED ORDER — BISACODYL 10 MG RE SUPP
10.0000 mg | RECTAL | Status: DC | PRN
  Filled 2021-01-30: qty 1

## 2021-01-30 MED ORDER — IPRATROPIUM-ALBUTEROL 0.5-2.5 (3) MG/3ML IN SOLN
3.0000 mL | Freq: Every day | RESPIRATORY_TRACT | Status: DC
  Administered 2021-01-30: 3 mL via RESPIRATORY_TRACT
  Filled 2021-01-30: qty 3

## 2021-01-30 MED ORDER — AMIODARONE HCL 200 MG PO TABS
400.0000 mg | ORAL_TABLET | Freq: Every day | ORAL | Status: DC
  Administered 2021-01-31 – 2021-02-05 (×6): 400 mg via ORAL
  Filled 2021-01-30 (×7): qty 2

## 2021-01-30 MED ORDER — SODIUM CHLORIDE 0.9 % IV BOLUS
1000.0000 mL | Freq: Once | INTRAVENOUS | Status: AC
  Administered 2021-01-30: 1000 mL via INTRAVENOUS

## 2021-01-30 MED ORDER — CAMPHOR-MENTHOL 0.5-0.5 % EX LOTN
TOPICAL_LOTION | CUTANEOUS | Status: DC | PRN
  Filled 2021-01-30: qty 222

## 2021-01-30 MED ORDER — BISACODYL 10 MG RE SUPP
10.0000 mg | Freq: Every day | RECTAL | Status: DC
  Administered 2021-01-31 – 2021-02-04 (×3): 10 mg via RECTAL
  Filled 2021-01-30 (×5): qty 1

## 2021-01-30 MED ORDER — TRAZODONE HCL 100 MG PO TABS
100.0000 mg | ORAL_TABLET | Freq: Every day | ORAL | Status: DC
  Administered 2021-01-30 – 2021-02-04 (×6): 100 mg via ORAL
  Filled 2021-01-30 (×6): qty 1

## 2021-01-30 MED ORDER — SENNA 8.6 MG PO TABS
2.0000 | ORAL_TABLET | Freq: Every day | ORAL | Status: DC
Start: 2021-01-30 — End: 2021-02-05
  Administered 2021-01-30 – 2021-02-04 (×6): 17.2 mg via ORAL
  Filled 2021-01-30 (×6): qty 2

## 2021-01-30 MED ORDER — PSYLLIUM 95 % PO PACK
1.0000 | PACK | Freq: Every day | ORAL | Status: DC
  Administered 2021-01-31 – 2021-02-04 (×4): 1 via ORAL
  Filled 2021-01-30 (×7): qty 1

## 2021-01-30 MED ORDER — FINASTERIDE 5 MG PO TABS
5.0000 mg | ORAL_TABLET | Freq: Every day | ORAL | Status: DC
  Administered 2021-01-31 – 2021-02-05 (×6): 5 mg via ORAL
  Filled 2021-01-30 (×7): qty 1

## 2021-01-30 MED ORDER — FLUTICASONE-UMECLIDIN-VILANT 100-62.5-25 MCG/ACT IN AEPB: 1.0000 | INHALATION_SPRAY | Freq: Every day | RESPIRATORY_TRACT | Status: DC

## 2021-01-30 MED ORDER — CARBIDOPA-LEVODOPA 25-100 MG PO TABS
1.0000 | ORAL_TABLET | Freq: Three times a day (TID) | ORAL | Status: DC
  Administered 2021-01-31 – 2021-02-05 (×17): 1 via ORAL
  Filled 2021-01-30 (×19): qty 1

## 2021-01-30 MED ORDER — TRIAMCINOLONE ACETONIDE 0.1 % EX CREA
1.0000 "application " | TOPICAL_CREAM | Freq: Two times a day (BID) | CUTANEOUS | Status: DC
  Administered 2021-01-31 – 2021-02-04 (×4): 1 via TOPICAL
  Filled 2021-01-30 (×2): qty 15

## 2021-01-30 MED ORDER — IOHEXOL 300 MG/ML  SOLN
100.0000 mL | Freq: Once | INTRAMUSCULAR | Status: AC | PRN
  Administered 2021-01-30: 100 mL via INTRAVENOUS
  Filled 2021-01-30: qty 100

## 2021-01-30 MED ORDER — FERROUS SULFATE 325 (65 FE) MG PO TABS
325.0000 mg | ORAL_TABLET | Freq: Three times a day (TID) | ORAL | Status: DC
  Administered 2021-01-30 – 2021-01-31 (×2): 325 mg via ORAL
  Filled 2021-01-30 (×2): qty 1

## 2021-01-30 MED ORDER — ESCITALOPRAM OXALATE 10 MG PO TABS
5.0000 mg | ORAL_TABLET | Freq: Every day | ORAL | Status: DC
  Administered 2021-01-30 – 2021-02-05 (×7): 5 mg via ORAL
  Filled 2021-01-30 (×3): qty 0.5
  Filled 2021-01-30: qty 1
  Filled 2021-01-30 (×3): qty 0.5

## 2021-01-30 MED ORDER — ACETAMINOPHEN 650 MG RE SUPP
650.0000 mg | Freq: Four times a day (QID) | RECTAL | Status: DC | PRN
  Filled 2021-01-30: qty 1

## 2021-01-30 MED ORDER — DEXTROSE IN LACTATED RINGERS 5 % IV SOLN: INTRAVENOUS | Status: DC

## 2021-01-30 MED ORDER — HYDROCODONE-ACETAMINOPHEN 5-325 MG PO TABS
1.0000 | ORAL_TABLET | Freq: Once | ORAL | Status: AC
  Administered 2021-01-30: 1 via ORAL
  Filled 2021-01-30: qty 1

## 2021-01-30 MED ORDER — ALBUTEROL SULFATE (2.5 MG/3ML) 0.083% IN NEBU
2.5000 mg | INHALATION_SOLUTION | RESPIRATORY_TRACT | Status: DC | PRN
  Administered 2021-01-31: 2.5 mg via RESPIRATORY_TRACT
  Filled 2021-01-30: qty 3

## 2021-01-30 MED ORDER — METHOCARBAMOL 500 MG PO TABS
1000.0000 mg | ORAL_TABLET | Freq: Three times a day (TID) | ORAL | Status: DC | PRN
  Filled 2021-01-30: qty 2

## 2021-01-30 MED ORDER — POLYETHYLENE GLYCOL 3350 17 G PO PACK
17.0000 g | PACK | Freq: Every day | ORAL | Status: DC
  Administered 2021-01-31: 17 g via ORAL
  Filled 2021-01-30: qty 1

## 2021-01-30 MED ORDER — ALBUTEROL SULFATE 108 (90 BASE) MCG/ACT IN AEPB: 2.0000 | INHALATION_SPRAY | RESPIRATORY_TRACT | Status: DC | PRN

## 2021-01-30 MED ORDER — DOCUSATE SODIUM 100 MG PO CAPS
200.0000 mg | ORAL_CAPSULE | Freq: Once | ORAL | Status: AC
  Administered 2021-01-30: 200 mg via ORAL
  Filled 2021-01-30: qty 2

## 2021-01-30 MED ORDER — SIMETHICONE 80 MG PO CHEW
80.0000 mg | CHEWABLE_TABLET | Freq: Four times a day (QID) | ORAL | Status: DC | PRN
  Filled 2021-01-30: qty 1

## 2021-01-30 MED ORDER — UMECLIDINIUM BROMIDE 62.5 MCG/ACT IN AEPB
1.0000 | INHALATION_SPRAY | Freq: Every day | RESPIRATORY_TRACT | Status: DC
  Administered 2021-01-31 – 2021-02-04 (×5): 1 via RESPIRATORY_TRACT
  Filled 2021-01-30: qty 7

## 2021-01-30 MED ORDER — HEPARIN SODIUM (PORCINE) 5000 UNIT/ML IJ SOLN: 5000.0000 [IU] | Freq: Three times a day (TID) | INTRAMUSCULAR | Status: DC

## 2021-01-30 MED ORDER — BISACODYL 10 MG RE SUPP
10.0000 mg | Freq: Once | RECTAL | Status: DC
  Filled 2021-01-30: qty 1

## 2021-01-30 MED ORDER — TRAMADOL HCL 50 MG PO TABS: 50.0000 mg | ORAL_TABLET | Freq: Two times a day (BID) | ORAL | Status: DC | PRN

## 2021-01-30 MED ORDER — FLUTICASONE FUROATE-VILANTEROL 100-25 MCG/ACT IN AEPB
1.0000 | INHALATION_SPRAY | Freq: Every day | RESPIRATORY_TRACT | Status: DC
  Administered 2021-01-30 – 2021-02-04 (×6): 1 via RESPIRATORY_TRACT
  Filled 2021-01-30: qty 28

## 2021-01-30 MED ORDER — HYDROXYZINE HCL 25 MG PO TABS: 25.0000 mg | ORAL_TABLET | Freq: Two times a day (BID) | ORAL | Status: DC | PRN

## 2021-01-30 MED ORDER — HYDROCERIN EX CREA
1.0000 "application " | TOPICAL_CREAM | Freq: Two times a day (BID) | CUTANEOUS | Status: DC
  Administered 2021-01-31 – 2021-02-04 (×5): 1 via TOPICAL
  Filled 2021-01-30: qty 113

## 2021-01-30 MED ORDER — GABAPENTIN 400 MG PO CAPS
800.0000 mg | ORAL_CAPSULE | Freq: Two times a day (BID) | ORAL | Status: DC
  Administered 2021-01-30 – 2021-02-05 (×12): 800 mg via ORAL
  Filled 2021-01-30 (×12): qty 2

## 2021-01-30 MED ORDER — PANTOPRAZOLE SODIUM 40 MG PO TBEC
40.0000 mg | DELAYED_RELEASE_TABLET | Freq: Two times a day (BID) | ORAL | Status: DC
  Administered 2021-01-30 – 2021-02-05 (×12): 40 mg via ORAL
  Filled 2021-01-30 (×12): qty 1

## 2021-01-30 MED ORDER — ONDANSETRON HCL 4 MG/2ML IJ SOLN
4.0000 mg | Freq: Once | INTRAMUSCULAR | Status: AC
  Administered 2021-01-30: 4 mg via INTRAVENOUS
  Filled 2021-01-30: qty 2

## 2021-01-30 MED ORDER — NIVEA EX CREA
1.0000 "application " | TOPICAL_CREAM | CUTANEOUS | Status: DC | PRN
  Filled 2021-01-30: qty 120

## 2021-01-30 MED ORDER — ONDANSETRON HCL 4 MG/2ML IJ SOLN: 4.0000 mg | Freq: Four times a day (QID) | INTRAMUSCULAR | Status: DC | PRN

## 2021-01-30 MED ORDER — MORPHINE SULFATE (PF) 4 MG/ML IV SOLN
4.0000 mg | Freq: Once | INTRAVENOUS | Status: AC
  Administered 2021-01-30: 4 mg via INTRAVENOUS
  Filled 2021-01-30: qty 1

## 2021-01-30 MED ORDER — GABAPENTIN 800 MG PO TABS
800.0000 mg | ORAL_TABLET | Freq: Two times a day (BID) | ORAL | Status: DC
  Filled 2021-01-30 (×2): qty 1

## 2021-01-30 MED ORDER — APIXABAN 5 MG PO TABS
5.0000 mg | ORAL_TABLET | Freq: Two times a day (BID) | ORAL | Status: DC
  Administered 2021-01-30 – 2021-02-05 (×13): 5 mg via ORAL
  Filled 2021-01-30 (×13): qty 1

## 2021-01-30 MED ORDER — INSULIN ASPART 100 UNIT/ML IJ SOLN
0.0000 [IU] | INTRAMUSCULAR | Status: DC
  Administered 2021-01-30 – 2021-01-31 (×2): 4 [IU] via SUBCUTANEOUS
  Administered 2021-01-31 (×3): 3 [IU] via SUBCUTANEOUS
  Administered 2021-02-01 (×2): 4 [IU] via SUBCUTANEOUS
  Administered 2021-02-01: 3 [IU] via SUBCUTANEOUS
  Administered 2021-02-01 – 2021-02-03 (×5): 4 [IU] via SUBCUTANEOUS
  Administered 2021-02-03 (×2): 3 [IU] via SUBCUTANEOUS
  Administered 2021-02-04: 4 [IU] via SUBCUTANEOUS
  Administered 2021-02-04 – 2021-02-05 (×4): 3 [IU] via SUBCUTANEOUS
  Filled 2021-01-30 (×19): qty 1

## 2021-01-30 MED ORDER — IPRATROPIUM BROMIDE 0.03 % NA SOLN
2.0000 | Freq: Two times a day (BID) | NASAL | Status: DC
  Administered 2021-01-30 – 2021-02-04 (×10): 2 via NASAL
  Filled 2021-01-30: qty 30

## 2021-01-30 MED ORDER — ONDANSETRON HCL 4 MG PO TABS: 4.0000 mg | ORAL_TABLET | Freq: Four times a day (QID) | ORAL | Status: DC | PRN

## 2021-01-30 MED ORDER — ACETAMINOPHEN 325 MG PO TABS
650.0000 mg | ORAL_TABLET | Freq: Four times a day (QID) | ORAL | Status: DC | PRN
  Filled 2021-01-30: qty 2

## 2021-01-30 MED ORDER — LACTASE 3000 UNITS PO TABS
3000.0000 [IU] | ORAL_TABLET | Freq: Three times a day (TID) | ORAL | Status: DC
  Administered 2021-01-31 – 2021-02-05 (×15): 3000 [IU] via ORAL
  Filled 2021-01-30 (×19): qty 1

## 2021-01-30 MED ORDER — GLUCERNA SHAKE PO LIQD
237.0000 mL | Freq: Two times a day (BID) | ORAL | Status: DC
  Administered 2021-01-31 (×2): 237 mL via ORAL

## 2021-01-30 NOTE — Assessment & Plan Note (Signed)
Upon presentation the patient's baseline creatinine of 1.2 had increased to 1.75. With hydration this has come down to 1.24. Monitor creatinine

## 2021-01-30 NOTE — Assessment & Plan Note (Signed)
Noted. COPD also compicated by the loss of vital capacity due to abdominal distension. Continue Albuterol prn , Trelegy daily, and duonebs scheduled as at home.

## 2021-01-30 NOTE — H&P (Signed)
Raymond Hays is an 75 y.o. male.   Chief Complaint: Abdominal pain, distention, and nausea and vomiting. Also shortness of breath. HPI: The patient is a 75 yr old man who states that his problems began on 01/28/2021. His last BM was 4-5 days ago. He has been unable to keep anything down and feels that he very short of breath.  He has a past medical history significant for Anxiety, COPD, chronic constipation, iron deficiency anemia, DM II, Parkinsons Disease, PAF, HTN, Spinal stenosis, and chronic right lower extremity edema.   In the ED the patient was found to be hypoxic with SaO2 of 88%. His initial lactic acid was 1.7, that has now declined to 1.4. His creatinine has decreased from 1.75 to 1.24. He is saturating 98% on 2L currently.   He has received stool softeners and an enema without improvement.   CT abdomen and pelvis have demonstrated gas and stool throughout the colonic confines. There is also cholelithiasis without signs of cholecystitis.  He presented on 01/29/2021. [facility] were consulted for admission for further evaluation and treatment on 01/30/2021.   Past Medical History:  Diagnosis Date   Allergic rhinitis    Anxiety    Arthritis    Chronic indwelling Foley catheter    COPD (chronic obstructive pulmonary disease) (HCC)    Cough    Diabetes (HCC)    Essential (primary) hypertension    Hemorrhoid    Hyperlipidemia    PAF (paroxysmal atrial fibrillation) (HCC)    Primary osteoarthritis, unspecified shoulder    Retention of urine, unspecified    Spinal stenosis    UTI (urinary tract infection)     Past Surgical History:  Procedure Laterality Date   APPENDECTOMY     BACK SURGERY     COLONOSCOPY N/A 10/27/2020   Procedure: COLONOSCOPY;  Surgeon: Wyline Mood, MD;  Location: [facility] ENDOSCOPY;  Service: Gastroenterology;  Laterality: N/A;   ELBOW SURGERY     ESOPHAGOGASTRODUODENOSCOPY (EGD) WITH PROPOFOL N/A 10/27/2020   Procedure: ESOPHAGOGASTRODUODENOSCOPY  (EGD) WITH PROPOFOL;  Surgeon: Wyline Mood, MD;  Location: [facility] ENDOSCOPY;  Service: Gastroenterology;  Laterality: N/A;   HERNIA REPAIR     KNEE SURGERY     neck     PARTIAL HIP ARTHROPLASTY Bilateral    TEE WITHOUT CARDIOVERSION N/A 10/19/2020   Procedure: TRANSESOPHAGEAL ECHOCARDIOGRAM (TEE);  Surgeon: Antonieta Iba, MD;  Location: [facility] ORS;  Service: Cardiovascular;  Laterality: N/A;   TONSILLECTOMY      Family History  Problem Relation Age of Onset   Healthy Son    Healthy Son    Social History:  reports that he quit smoking about 16 years ago. His smoking use included cigarettes. He has a 5.00 pack-year smoking history. He has never used smokeless tobacco. He reports that he does not currently use alcohol. He reports that he does not currently use drugs after having used the following drugs: "Crack" cocaine. (Not in a hospital admission)   Allergies: No Known Allergies  ROS: 12 systems were reviewed with the patient. All were negative except for those elements included in the HPI above.   General appearance: alert, cooperative, and moderate distress Head: Normocephalic, without obvious abnormality, atraumatic Eyes: conjunctivae/corneas clear. PERRL, EOM's intact. Fundi benign. Throat: lips, mucosa, and tongue normal; teeth and gums normal Neck: no adenopathy, no carotid bruit, no JVD, supple, symmetrical, trachea midline, and thyroid not enlarged, symmetric, no tenderness/mass/nodules Resp:  Diminished breath sounds bilaterally. Increased work of breathing with tachypnea and accessory muscle  use. 2-3 work conversational dyspnea. No wheezes, rales, or rhonchi are auscultated.  Chest wall: no tenderness Cardio: regular rate and rhythm, S1, S2 normal, no murmur, click, rub or gallop GI:  The abdomen is taut, hard, tympanic. Tender to palpation. Hypoactive bowel sounds. Unable to evaluate for organomegaly , hernia, or masses due to distension. Extremities: edema 4+ pitting edema  of the right lower extremity. None in the left lower extremity.  Pulses: 2+ and symmetric Skin: Skin color, texture, turgor normal. No rashes or lesions Lymph nodes: Cervical, supraclavicular, and axillary nodes normal. Neurologic: Alert and oriented X 3, normal strength and tone. Normal symmetric reflexes. Normal coordination and gait Incision/Wound: None noted.  Results for orders placed or performed during the hospital encounter of 01/30/21 (from the past 48 hour(s))  CBC with Differential     Status: Abnormal   Collection Time: 01/29/21  5:58 PM  Result Value Ref Range   WBC 15.8 (H) 4.0 - 10.5 K/uL   RBC 5.07 4.22 - 5.81 MIL/uL   Hemoglobin 16.6 13.0 - 17.0 g/dL   HCT 14.9 70.2 - 63.7 %   MCV 92.7 80.0 - 100.0 fL   MCH 32.7 26.0 - 34.0 pg   MCHC 35.3 30.0 - 36.0 g/dL   RDW 85.8 (H) 85.0 - 27.7 %   Platelets 287 150 - 400 K/uL   nRBC 0.3 (H) 0.0 - 0.2 %   Neutrophils Relative % 84 %   Neutro Abs 13.1 (H) 1.7 - 7.7 K/uL   Lymphocytes Relative 8 %   Lymphs Abs 1.3 0.7 - 4.0 K/uL   Monocytes Relative 6 %   Monocytes Absolute 1.0 0.1 - 1.0 K/uL   Eosinophils Relative 0 %   Eosinophils Absolute 0.0 0.0 - 0.5 K/uL   Basophils Relative 0 %   Basophils Absolute 0.0 0.0 - 0.1 K/uL   Immature Granulocytes 2 %   Abs Immature Granulocytes 0.38 (H) 0.00 - 0.07 K/uL    Comment: Performed at [facility], [address] [place], [place] [address]  Comprehensive metabolic panel     Status: Abnormal   Collection Time: 01/29/21  5:58 PM  Result Value Ref Range   Sodium 136 135 - 145 mmol/L   Potassium 4.0 3.5 - 5.1 mmol/L   Chloride 101 98 - 111 mmol/L   CO2 26 22 - 32 mmol/L   Glucose, Bld 191 (H) 70 - 99 mg/dL    Comment: Glucose reference range applies only to samples taken after fasting for at least 8 hours.   BUN 47 (H) 8 - 23 mg/dL   Creatinine, Ser 8.67 (H) 0.61 - 1.24 mg/dL   Calcium 9.2 8.9 - 67.2 mg/dL   Total Protein 7.1 6.5 - 8.1 g/dL   Albumin 4.0 3.5 - 5.0  g/dL   AST 13 (L) 15 - 41 U/L   ALT 9 0 - 44 U/L   Alkaline Phosphatase 73 38 - 126 U/L   Total Bilirubin 1.4 (H) 0.3 - 1.2 mg/dL   GFR, Estimated 50 (L) >60 mL/min    Comment: (NOTE) Calculated using the CKD-EPI Creatinine Equation (2021)    Anion gap 9 5 - 15    Comment: Performed at [facility], [address] [place], [place] [address]  Lipase, blood     Status: None   Collection Time: 01/29/21  5:58 PM  Result Value Ref Range   Lipase 30 11 - 51 U/L    Comment: Performed at [facility],  [address], [place], [place] [address]  Troponin I (High Sensitivity)     Status: None   Collection Time: 01/29/21  5:58 PM  Result Value Ref Range   Troponin I (High Sensitivity) 15 <18 ng/L    Comment: (NOTE) Elevated high sensitivity troponin I (hsTnI) values and significant  changes across serial measurements may suggest ACS but many other  chronic and acute conditions are known to elevate hsTnI results.  Refer to the "Links" section for chest pain algorithms and additional  guidance. Performed at [facility], [address] [place], [place] [address]   Brain natriuretic peptide     Status: Abnormal   Collection Time: 01/29/21  5:58 PM  Result Value Ref Range   B Natriuretic Peptide 232.9 (H) 0.0 - 100.0 pg/mL    Comment: Performed at [facility], [address] [place], [place] [address]  Lactic acid, plasma     Status: None   Collection Time: 01/29/21 11:25 PM  Result Value Ref Range   Lactic Acid, Venous 1.7 0.5 - 1.9 mmol/L    Comment: Performed at [facility], [address] [place], [place] [address]  Troponin I (High Sensitivity)     Status: Abnormal   Collection Time: 01/29/21 11:25 PM  Result Value Ref Range   Troponin I (High Sensitivity) 19 (H) <18 ng/L    Comment: (NOTE) Elevated high sensitivity troponin I (hsTnI) values and significant  changes across serial measurements may suggest ACS but many other   chronic and acute conditions are known to elevate hsTnI results.  Refer to the "Links" section for chest pain algorithms and additional  guidance. Performed at [facility], [address] [place], [place] [address]   Resp Panel by RT-PCR (Flu A&B, Covid) Nasopharyngeal Swab     Status: None   Collection Time: 01/30/21 12:21 AM   Specimen: Nasopharyngeal Swab; Nasopharyngeal(NP) swabs in vial transport medium  Result Value Ref Range   SARS Coronavirus 2 by RT PCR NEGATIVE NEGATIVE    Comment: (NOTE) SARS-CoV-2 target nucleic acids are NOT DETECTED.  The SARS-CoV-2 RNA is generally detectable in upper respiratory specimens during the acute phase of infection. The lowest concentration of SARS-CoV-2 viral copies this assay can detect is 138 copies/mL. A negative result does not preclude SARS-Cov-2 infection and should not be used as the sole basis for treatment or other patient management decisions. A negative result may occur with  improper specimen collection/handling, submission of specimen other than nasopharyngeal swab, presence of viral mutation(s) within the areas targeted by this assay, and inadequate number of viral copies(<138 copies/mL). A negative result must be combined with clinical observations, patient history, and epidemiological information. The expected result is Negative.  Fact Sheet for Patients:  BloggerCourse.com  Fact Sheet for Healthcare Providers:  SeriousBroker.it  This test is no t yet approved or cleared by the [place] FDA and  has been authorized for detection and/or diagnosis of SARS-CoV-2 by FDA under an Emergency Use Authorization (EUA). This EUA will remain  in effect (meaning this test can be used) for the duration of the COVID-19 declaration under Section 564(b)(1) of the Act, 21 U.S.C.section 360bbb-3(b)(1), unless the authorization is terminated  or revoked sooner.        Influenza A by PCR NEGATIVE NEGATIVE   Influenza B by PCR NEGATIVE NEGATIVE    Comment: (NOTE) The Xpert Xpress SARS-CoV-2/FLU/RSV plus assay is intended as an aid in the diagnosis of influenza from Nasopharyngeal swab  specimens and should not be used as a sole basis for treatment. Nasal washings and aspirates are unacceptable for Xpert Xpress SARS-CoV-2/FLU/RSV testing.  Fact Sheet for Patients: BloggerCourse.com  Fact Sheet for Healthcare Providers: SeriousBroker.it  This test is not yet approved or cleared by the [place] FDA and has been authorized for detection and/or diagnosis of SARS-CoV-2 by FDA under an Emergency Use Authorization (EUA). This EUA will remain in effect (meaning this test can be used) for the duration of the COVID-19 declaration under Section 564(b)(1) of the Act, 21 U.S.C. section 360bbb-3(b)(1), unless the authorization is terminated or revoked.  Performed at [facility], [address] [place], [place] [address]   Urinalysis, Complete w Microscopic     Status: Abnormal   Collection Time: 01/30/21 11:40 AM  Result Value Ref Range   Color, Urine YELLOW (A) YELLOW   APPearance HAZY (A) CLEAR   Specific Gravity, Urine 1.033 (H) 1.005 - 1.030   pH 5.0 5.0 - 8.0   Glucose, UA NEGATIVE NEGATIVE mg/dL   Hgb urine dipstick MODERATE (A) NEGATIVE   Bilirubin Urine NEGATIVE NEGATIVE   Ketones, ur NEGATIVE NEGATIVE mg/dL   Protein, ur NEGATIVE NEGATIVE mg/dL   Nitrite NEGATIVE NEGATIVE   Leukocytes,Ua SMALL (A) NEGATIVE   RBC / HPF 21-50 0 - 5 RBC/hpf   WBC, UA 6-10 0 - 5 WBC/hpf   Bacteria, UA NONE SEEN NONE SEEN   Squamous Epithelial / LPF NONE SEEN 0 - 5   Mucus PRESENT     Comment: Performed at [facility], [address] [place], [place] [address]  CBC     Status: Abnormal   Collection Time: 01/30/21  2:50 PM  Result Value Ref Range   WBC 12.3 (H) 4.0 - 10.5 K/uL   RBC  4.60 4.22 - 5.81 MIL/uL   Hemoglobin 14.7 13.0 - 17.0 g/dL   HCT 41.9 62.2 - 29.7 %   MCV 94.1 80.0 - 100.0 fL   MCH 32.0 26.0 - 34.0 pg   MCHC 33.9 30.0 - 36.0 g/dL   RDW 98.9 (H) 21.1 - 94.1 %   Platelets 198 150 - 400 K/uL   nRBC 0.3 (H) 0.0 - 0.2 %    Comment: Performed at [facility], [address] [place], [place] [address]  Creatinine, serum     Status: None   Collection Time: 01/30/21  2:50 PM  Result Value Ref Range   Creatinine, Ser 1.24 0.61 - 1.24 mg/dL   GFR, Estimated >44 >81 mL/min    Comment: (NOTE) Calculated using the CKD-EPI Creatinine Equation (2021) Performed at [facility], [address] [place], [place] [address]   Lactic acid, plasma     Status: None   Collection Time: 01/30/21  3:32 PM  Result Value Ref Range   Lactic Acid, Venous 1.4 0.5 - 1.9 mmol/L    Comment: Performed at [facility], [address] [place], [place] [address]   [email or web address] @  Blood pressure 103/78, pulse (!) 101, temperature 97.7 F (36.5 C), temperature source Oral, resp. rate 20, height 6' (1.829 m), weight 104.5 kg, SpO2 95 %.    Assessment/Plan Respiratory failure (HCC) Acute and due to abdominal distention. The patient presented with SaO2 of 88% on room air. He is now saturating 98% on 2L by nasal cannula.  Obstipation The abdomen is taut and hard. The patient is unable to take any fluids. CT abdomen and pelvis yesterday demonstrated only stool and gas throughout  the colonic confines. There were also stones in the gallbladder without evidence of cholecystectomy. The patient has been given stool softeners in the ED and has also had an enema. I have ordered a repeat CT abdomen and pelvis. I have also discussed the patient with Dr. Tonna Boehringer who stated that he would take a look at the patient. The patient may require NGT to decompress the abdomen. Constipation is a chronic problem for the patient. At home he takes Bisacodyl supp on an as needed  basis, Miralax 17 g daily, Sennakot 2 Po daily, Psyllium 1 packet daily, and Docusate Na 200 mg daily ( he had stopped taking this).   AF (paroxysmal atrial fibrillation) (HCC) The patient presents with atrial fibrillation with HR of 99. Stable. It is controlled at home on Metoprolol 100 mg bid and amiodarone. He is on eliquis for stroke prophylaxis. Continue amiodarone and metoprolol as possible. Monitor on telemetry.  AKI (acute kidney injury) (HCC) Upon presentation the patient's baseline creatinine of 1.2 had increased to 1.75. With hydration this has come down to 1.24. Monitor creatinine   Anxiety Noted. Continue Hydroxyzine as at home.  Chronic pain Continue tramadol 100 mg bid as at home.   Parkinson disease (HCC) Continue Sinemet as at home.   COPD with acute exacerbation (HCC) Noted. COPD also compicated by the loss of vital capacity due to abdominal distension. Continue Albuterol prn , Trelegy daily, and duonebs scheduled as at home.  Type 2 diabetes mellitus with diabetic neuropathy, without long-term current use of insulin (HCC) Controlled at home with metformin XR 500 mg daily. Last HbA1c was 6.7 on 01/10/2021. Monitor glucoses with FSBS and SSI.   Iron deficiency anemia The patient takes FeSO4 325 mg tid at home. This probably contributes a great deal to his chronic constipation.  Essential hypertension Blood pressures have trended down throughout the day. They are now low normal. At home the patient takes, Metoprolol 100 mg bid for both rate control for atrial fibrillation and HTN. This has been continued. Parameters have been placed on this medication.  Chronic edema of the right lower extremity: 4+ pitting edema. Chronic per patient.   I have seen and examined this patient myself. I have spent 80 minutes in his evaluation and care.  Audie Stayer 01/30/2021, 8:08 PM

## 2021-01-30 NOTE — Assessment & Plan Note (Signed)
Acute and due to abdominal distention. The patient presented with SaO2 of 88% on room air. He is now saturating 98% on 2L by nasal cannula.

## 2021-01-30 NOTE — Assessment & Plan Note (Signed)
Blood pressures have trended down throughout the day. They are now low normal. At home the patient takes, Metoprolol 100 mg bid for both rate control for atrial fibrillation and HTN. This has been continued. Parameters have been placed on this medication.

## 2021-01-30 NOTE — Assessment & Plan Note (Signed)
Continue Sinemet as at home.

## 2021-01-30 NOTE — ED Provider Notes (Signed)
[facility] Emergency Department Provider Note  Time seen: 9:19 AM  I have reviewed the triage vital signs and the nursing notes.   HISTORY  Chief Complaint Abdominal Pain   HPI Raymond Hays is a 75 y.o. male with a past medical history of anxiety, arthritis, COPD, diabetes, hyperlipidemia, paroxysmal atrial fibrillation, left lower extremity paralysis, largely bedbound, presents to the emergency department for abdominal pain distention nausea vomiting.  According to the patient over the past 2 days he has had significant abdominal distention discomfort as well as nausea and vomiting unable to keep down any fluids per patient.  Patient denies any fever.  Describes abdominal pain is more periumbilical, moderate in severity.  No known fever.  No cough or congestion.  Past Medical History:  Diagnosis Date   Allergic rhinitis    Anxiety    Arthritis    Chronic indwelling Foley catheter    COPD (chronic obstructive pulmonary disease) (HCC)    Cough    Diabetes ([place])    Essential (primary) hypertension    Hemorrhoid    Hyperlipidemia    PAF (paroxysmal atrial fibrillation) (HCC)    Primary osteoarthritis, unspecified shoulder    Retention of urine, unspecified    Spinal stenosis    UTI (urinary tract infection)     Patient Active Problem List   Diagnosis Date Noted   Acute respiratory failure ([place]) 01/09/2021   Ileus (HCC)    Neck pain    AKI (acute kidney injury) ([place])    Type 2 diabetes mellitus with diabetic neuropathy, without long-term current use of insulin (HCC)    Parkinson disease ([place])    Sepsis ([place]) 10/16/2020   Chronic respiratory failure with hypoxia ([place]) 01/10/2020   Impaired functional mobility, balance, gait, and endurance 09/15/2019   Catheter cystitis ([place]) 10/16/2018   COVID-19 virus detected 07/03/2018   Benign prostatic hyperplasia with lower urinary tract symptoms 08/06/2017   Osteoarthritis of right knee 06/19/2017   S/P  orthopedic surgery, follow-up exam 10/25/2016   Tendon rupture of wrist, sequela 01/02/2016   Acute pain of right knee 08/03/2015   Calcific tendinitis of left shoulder 07/05/2015   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/05/2015   Gait difficulty 06/26/2015   Severe mitral regurgitation 06/05/2015   AF (paroxysmal atrial fibrillation) ([place]) 05/31/2015   Chronic pain 05/31/2015   COPD with acute exacerbation ([place]) 05/31/2015   Essential hypertension 05/31/2015   Chronic pain of left wrist 04/24/2015   Arthritis of left wrist 04/24/2015   Rupture of extensor tendon of left hand 04/24/2015    Past Surgical History:  Procedure Laterality Date   APPENDECTOMY     BACK SURGERY     COLONOSCOPY N/A 10/27/2020   Procedure: COLONOSCOPY;  Surgeon: Jonathon Bellows, MD;  Location: [facility] ENDOSCOPY;  Service: Gastroenterology;  Laterality: N/A;   ELBOW SURGERY     ESOPHAGOGASTRODUODENOSCOPY (EGD) WITH PROPOFOL N/A 10/27/2020   Procedure: ESOPHAGOGASTRODUODENOSCOPY (EGD) WITH PROPOFOL;  Surgeon: Jonathon Bellows, MD;  Location: [facility] ENDOSCOPY;  Service: Gastroenterology;  Laterality: N/A;   HERNIA REPAIR     KNEE SURGERY     neck     PARTIAL HIP ARTHROPLASTY Bilateral    TEE WITHOUT CARDIOVERSION N/A 10/19/2020   Procedure: TRANSESOPHAGEAL ECHOCARDIOGRAM (TEE);  Surgeon: Minna Merritts, MD;  Location: [facility] ORS;  Service: Cardiovascular;  Laterality: N/A;   TONSILLECTOMY      Prior to Admission medications   Medication Sig Start Date End Date Taking? Authorizing Provider  acetaminophen (TYLENOL) 325  MG tablet Take 650 mg by mouth every 4 (four) hours as needed.    [provider]  Albuterol Sulfate (PROAIR RESPICLICK) 108 (90 Base) MCG/ACT AEPB Inhale 2 puffs into the lungs every 4 (four) hours as needed (COPD).    [provider]  amiodarone (PACERONE) 400 MG tablet Take 1 tablet (400 mg total) by mouth daily. 01/23/21   Tresa Moore, MD  ANTI-DANDRUFF 1 % SHAM Apply topically.  02/18/20   [provider]  apixaban (ELIQUIS) 5 MG TABS tablet Take 5 mg by mouth 2 (two) times daily.  02/06/17   [provider]  atorvastatin (LIPITOR) 20 MG tablet Take 20 mg by mouth daily.    [provider]  BIOFREEZE 4 % GEL Apply 1 application topically 3 (three) times daily. 11/17/20   [provider]  bisacodyl (DULCOLAX) 10 MG suppository Place 10 mg rectally as needed for moderate constipation.    [provider]  calcium carbonate (TUMS - DOSED IN MG ELEMENTAL CALCIUM) 500 MG chewable tablet Chew 2 tablets by mouth every 6 (six) hours as needed for indigestion or heartburn.    [provider]  carbidopa-levodopa (SINEMET IR) 25-100 MG tablet Take 1 tablet by mouth 3 (three) times daily before meals. At 0630, 1130, 1630    [provider]  Docusate Sodium (STOOL SOFTENER LAXATIVE PO) Take 2 tablets by mouth at bedtime.    [provider]  Emollient (CETAPHIL) cream Apply 1 application topically as needed.    [provider]  escitalopram (LEXAPRO) 5 MG tablet Take 5 mg by mouth daily.    [provider]  feeding supplement, GLUCERNA SHAKE, (GLUCERNA SHAKE) LIQD Take 237 mLs by mouth 2 (two) times daily between meals. 11/01/20   [name], MD  ferrous sulfate 325 (65 FE) MG tablet Take 1 tablet (325 mg total) by mouth 3 (three) times daily with meals. 11/01/20   [name], MD  finasteride (PROSCAR) 5 MG tablet Take 5 mg by mouth daily.    [provider]  fluticasone (FLONASE) 50 MCG/ACT nasal spray Place 1 spray into both nostrils daily.    [provider]  gabapentin (NEURONTIN) 800 MG tablet Take 800 mg by mouth 2 (two) times daily.     [provider]  guaiFENesin (MUCINEX) 600 MG 12 hr tablet Take 1 tablet (600 mg total) by mouth 2 (two) times daily. 01/22/21   Tresa Moore, MD  hydrOXYzine (ATARAX/VISTARIL) 25 MG tablet Take 25 mg by mouth 2 (two) times daily as  needed for itching. 02/17/20   [provider]  ipratropium (ATROVENT) 0.03 % nasal spray Place 2 sprays into both nostrils 2 (two) times daily. 02/18/20   [provider]  ipratropium-albuterol (DUONEB) 0.5-2.5 (3) MG/3ML SOLN Inhale 3 mLs into the lungs daily. And every 6 hours as needed for shortness of breath or wheezing    [provider]  lactase (LACTAID) 3000 units tablet Take 3,000 Units by mouth 3 (three) times daily with meals.    [provider]  metFORMIN (GLUCOPHAGE-XR) 500 MG 24 hr tablet Take 500 mg by mouth daily.     [provider]  methocarbamol (ROBAXIN) 500 MG tablet Take 1,000 mg by mouth in the morning and at bedtime.     [provider]  metoprolol tartrate (LOPRESSOR) 100 MG tablet Take 1 tablet (100 mg total) by mouth 2 (two) times daily. 01/22/21   Tresa Moore, MD  oxybutynin (DITROPAN-XL)  10 MG 24 hr tablet Take 1 tablet (10 mg total) by mouth daily. 11/01/20   Enzo Bi, MD  pantoprazole (PROTONIX) 40 MG tablet Take 1 tablet (40 mg total) by mouth 2 (two) times daily before a meal. For 8 weeks. 11/01/20 01/03/21  Enzo Bi, MD  polyethylene glycol (MIRALAX / GLYCOLAX) 17 g packet Take 17 g by mouth daily.    [provider]  psyllium (METAMUCIL) 58.6 % powder Take 1 packet by mouth daily.    [provider]  SARNA lotion Apply topically. 02/25/20   [provider]  simethicone (MYLICON) 0000000 MG chewable tablet Chew 125 mg by mouth 3 (three) times daily.    [provider]  Skin Protectants, Misc. (MINERIN CREME EX) Apply topically. Apply to feet twice bid    [provider]  torsemide (DEMADEX) 10 MG tablet Take 10 mg by mouth daily.    [provider]  traZODone (DESYREL) 100 MG tablet Take 100 mg by mouth at bedtime.    [provider]  TRELEGY ELLIPTA 100-62.5-25 MCG/INH AEPB Inhale 1 puff into the lungs daily. 02/17/20   [provider]   triamcinolone cream (KENALOG) 0.1 % Apply 1 application topically 2 (two) times daily.    [provider]    No Known Allergies  Family History  Problem Relation Age of Onset   Healthy Son    Healthy Son     Social History Social History   Tobacco Use   Smoking status: Former    Packs/day: 0.50    Years: 10.00    Pack years: 5.00    Types: Cigarettes    Quit date: 2006    Years since quitting: 16.8   Smokeless tobacco: Never  Vaping Use   Vaping Use: Never used  Substance Use Topics   Alcohol use: Not Currently   Drug use: Not Currently    Types: "Crack" cocaine    Comment: quit "crack" 15 years ago--11/04/2019    Review of Systems Constitutional: Negative for fever. Cardiovascular: Negative for chest pain. Respiratory: Negative for shortness of breath. Gastrointestinal: Moderate abdominal pain, distention.  Nausea vomiting.  Constipation times several days. Genitourinary: Suprapubic catheter. Musculoskeletal: Negative for musculoskeletal complaint Neurological: Negative for headache All other ROS negative  ____________________________________________   PHYSICAL EXAM:  VITAL SIGNS: ED Triage Vitals  Enc Vitals Group     BP 01/29/21 1829 128/74     Pulse Rate 01/29/21 1829 (!) 105     Resp 01/29/21 1829 20     Temp 01/30/21 0125 98.3 F (36.8 C)     Temp Source 01/30/21 0125 Oral     SpO2 01/29/21 1829 91 %     Weight 01/29/21 1802 230 lb 6.1 oz (104.5 kg)     Height 01/29/21 1802 6' (1.829 m)     Head Circumference --      Peak Flow --      Pain Score 01/29/21 1802 3     Pain Loc --      Pain Edu? --      Excl. in [place]? --    Constitutional: Alert and oriented.  No acute distress. Eyes: Normal exam ENT      Head: Normocephalic and atraumatic.      Mouth/Throat: Mucous membranes are moist. Cardiovascular: Normal rate, regular rhythm around 100 bpm. Respiratory: Normal respiratory effort without tachypnea nor retractions. Breath sounds are  clear Gastrointestinal: Patient has a fairly distended abdomen with tympanic percussion.  Mild diffuse  tenderness more so in the periumbilical area.  Suprapubic catheter present.  Patient does have vomitus on his clothing. Musculoskeletal: Nontender with normal range of motion in all extremities. Neurologic:  Normal speech and language. No gross focal neurologic deficits  Skin:  Skin is warm, dry and intact.  Psychiatric: Mood and affect are normal.  ____________________________________________     RADIOLOGY  Chest x-ray shows cardiomegaly with hazy left-sided density either edema versus pneumonia. CT scan shows cholelithiasis without cholecystitis, constipation without colitis.  ____________________________________________   INITIAL IMPRESSION / ASSESSMENT AND PLAN / ED COURSE  Pertinent labs & imaging results that were available during my care of the patient were reviewed by me and considered in my medical decision making (see chart for details).   Patient presents emergency department for abdominal distention abdominal pain nausea vomiting.  Patient has fairly significant distention on exam.  Lab work shows elevated white blood cell count of 15,000.  CT scan does not appear to show any bowel obstruction, no significant findings on CT.  Chest x-ray is questionable for mild edema versus pneumonia.  No fever, patient has right lower extremity edema that is chronic, abdominal distention highly suspect chest x-ray findings are more indicative of mild edema.  We will treat pain, nausea, IV hydrate and continue to closely monitor.  COVID/flu negative  Given the patient's abdominal distention and CT findings showing small bowel dilation with gas with lots of stool in the large intestine we will dose a soapsuds enema.  No relief after soapsuds enema little to no bowel movement.  Continues to have abdominal pain and nausea despite multiple rounds of nausea medication and a round of pain  medication.  Given the patient's distended abdomen elevated white blood cell count and symptoms more suggestive of an ileus we will admit to the hospital service for further work-up and treatment.  Patient agreeable to plan of care.  Raymond Hays was evaluated in Emergency Department on 01/30/2021 for the symptoms described in the history of present illness. He was evaluated in the context of the global COVID-19 pandemic, which necessitated consideration that the patient might be at risk for infection with the SARS-CoV-2 virus that causes COVID-19. Institutional protocols and algorithms that pertain to the evaluation of patients at risk for COVID-19 are in a state of rapid change based on information released by regulatory bodies including the CDC and federal and state organizations. These policies and algorithms were followed during the patient's care in the ED.  ____________________________________________   FINAL CLINICAL IMPRESSION(S) / ED DIAGNOSES  Abdominal pain Ileus   Harvest Dark, MD 01/30/21 1312

## 2021-01-30 NOTE — Assessment & Plan Note (Addendum)
The abdomen is taut and hard. The patient is unable to take any fluids. CT abdomen and pelvis yesterday demonstrated only stool and gas throughout the colonic confines. There were also stones in the gallbladder without evidence of cholecystectomy. The patient has been given stool softeners in the ED and has also had an enema. I have ordered a repeat CT abdomen and pelvis. I have also discussed the patient with Dr. Tonna Boehringer who stated that he would take a look at the patient. The patient may require NGT to decompress the abdomen. Constipation is a chronic problem for the patient. At home he takes Bisacodyl supp on an as needed basis, Miralax 17 g daily, Sennakot 2 Po daily, Psyllium 1 packet daily, and Docusate Na 200 mg daily ( he had stopped taking this).

## 2021-01-30 NOTE — Assessment & Plan Note (Addendum)
Controlled at home with metformin XR 500 mg daily. Last HbA1c was 6.7 on 01/10/2021. Monitor glucoses with FSBS and SSI.

## 2021-01-30 NOTE — Assessment & Plan Note (Signed)
The patient presents with atrial fibrillation with HR of 99. Stable. It is controlled at home on Metoprolol 100 mg bid and amiodarone. He is on eliquis for stroke prophylaxis. Continue amiodarone and metoprolol as possible. Monitor on telemetry.

## 2021-01-30 NOTE — Consult Note (Addendum)
Subjective:   CC: Obstipation  HPI:  Raymond Hays is a 75 y.o. male who was consulted by [facility] for issue above.  Symptoms were first noted several days ago.  Patient reports initially presented for generalized abdominal pain which has now resolved.  Patient states he has had issues having bowel movements in the past due to his lack of sensation and awareness when he has a bowel movement.  He cannot say whether or not his abdomen is distended currently.  He denies any nausea vomiting at this time.    Past Medical History:  has a past medical history of Allergic rhinitis, Anxiety, Arthritis, Chronic indwelling Foley catheter, COPD (chronic obstructive pulmonary disease) (HCC), Cough, Diabetes (HCC), Essential (primary) hypertension, Hemorrhoid, Hyperlipidemia, PAF (paroxysmal atrial fibrillation) (HCC), Primary osteoarthritis, unspecified shoulder, Retention of urine, unspecified, Spinal stenosis, and UTI (urinary tract infection).  Past Surgical History:  Past Surgical History:  Procedure Laterality Date   APPENDECTOMY     BACK SURGERY     COLONOSCOPY N/A 10/27/2020   Procedure: COLONOSCOPY;  Surgeon: Wyline Mood, MD;  Location: [facility] ENDOSCOPY;  Service: Gastroenterology;  Laterality: N/A;   ELBOW SURGERY     ESOPHAGOGASTRODUODENOSCOPY (EGD) WITH PROPOFOL N/A 10/27/2020   Procedure: ESOPHAGOGASTRODUODENOSCOPY (EGD) WITH PROPOFOL;  Surgeon: Wyline Mood, MD;  Location: [facility] ENDOSCOPY;  Service: Gastroenterology;  Laterality: N/A;   HERNIA REPAIR     KNEE SURGERY     neck     PARTIAL HIP ARTHROPLASTY Bilateral    TEE WITHOUT CARDIOVERSION N/A 10/19/2020   Procedure: TRANSESOPHAGEAL ECHOCARDIOGRAM (TEE);  Surgeon: Antonieta Iba, MD;  Location: [facility] ORS;  Service: Cardiovascular;  Laterality: N/A;   TONSILLECTOMY      Family History: family history includes Healthy in his son and son.  Social History:  reports that he quit smoking about 16 years ago. His smoking use included cigarettes. He  has a 5.00 pack-year smoking history. He has never used smokeless tobacco. He reports that he does not currently use alcohol. He reports that he does not currently use drugs after having used the following drugs: "Crack" cocaine.  Current Medications:  Prior to Admission medications   Medication Sig Start Date End Date Taking? Authorizing Provider  amiodarone (PACERONE) 400 MG tablet Take 1 tablet (400 mg total) by mouth daily. 01/23/21  Yes Sreenath, Sudheer B, MD  apixaban (ELIQUIS) 5 MG TABS tablet Take 5 mg by mouth 2 (two) times daily.  02/06/17  Yes [provider]  BIOFREEZE 4 % GEL Apply 1 application topically 3 (three) times daily. 11/17/20  Yes [provider]  carbidopa-levodopa (SINEMET IR) 25-100 MG tablet Take 1 tablet by mouth 3 (three) times daily before meals. At 0630, 1130, 1630   Yes [provider]  escitalopram (LEXAPRO) 5 MG tablet Take 5 mg by mouth daily.   Yes [provider]  ferrous sulfate 325 (65 FE) MG tablet Take 1 tablet (325 mg total) by mouth 3 (three) times daily with meals. 11/01/20  Yes [name], MD  finasteride (PROSCAR) 5 MG tablet Take 5 mg by mouth daily.   Yes [provider]  gabapentin (NEURONTIN) 800 MG tablet Take 800 mg by mouth 2 (two) times daily.    Yes [provider]  ipratropium (ATROVENT) 0.03 % nasal spray Place 2 sprays into both nostrils 2 (two) times daily. 02/18/20  Yes [provider]  ipratropium-albuterol (DUONEB) 0.5-2.5 (3) MG/3ML SOLN Inhale 3 mLs into the lungs daily. And every 6 hours as needed  for shortness of breath or wheezing   Yes [provider]  lactase (LACTAID) 3000 units tablet Take 3,000 Units by mouth 3 (three) times daily with meals.   Yes [provider]  methocarbamol (ROBAXIN) 500 MG tablet Take 1,000 mg by mouth in the morning and at bedtime.    Yes [provider]  metoprolol tartrate (LOPRESSOR) 100 MG tablet Take 1 tablet (100  mg total) by mouth 2 (two) times daily. 01/22/21  Yes Sreenath, Sudheer B, MD  oxybutynin (DITROPAN-XL) 10 MG 24 hr tablet Take 1 tablet (10 mg total) by mouth daily. 11/01/20  Yes Enzo Bi, MD  pantoprazole (PROTONIX) 40 MG tablet Take 1 tablet (40 mg total) by mouth 2 (two) times daily before a meal. For 8 weeks. 11/01/20 01/30/21 Yes Enzo Bi, MD  polyethylene glycol (MIRALAX / GLYCOLAX) 17 g packet Take 17 g by mouth daily.   Yes [provider]  psyllium (METAMUCIL) 58.6 % powder Take 1 packet by mouth daily.   Yes [provider]  senna (SENOKOT) 8.6 MG TABS tablet Take 2 tablets by mouth at bedtime.   Yes [provider]  simethicone (MYLICON) 0000000 MG chewable tablet Chew 125 mg by mouth 3 (three) times daily.   Yes [provider]  Skin Protectants, Misc. (MINERIN CREME EX) Apply topically. Apply to feet twice bid   Yes [provider]  torsemide (DEMADEX) 10 MG tablet Take 10 mg by mouth daily.   Yes [provider]  TRELEGY ELLIPTA 100-62.5-25 MCG/INH AEPB Inhale 1 puff into the lungs daily. 02/17/20  Yes [provider]  triamcinolone cream (KENALOG) 0.1 % Apply 1 application topically 2 (two) times daily.   Yes [provider]  acetaminophen (TYLENOL) 325 MG tablet Take 650 mg by mouth every 4 (four) hours as needed.    [provider]  Albuterol Sulfate (PROAIR RESPICLICK) 123XX123 (90 Base) MCG/ACT AEPB Inhale 2 puffs into the lungs every 4 (four) hours as needed (COPD).    [provider]  ANTI-DANDRUFF 1 % SHAM Apply topically. 02/18/20   [provider]  atorvastatin (LIPITOR) 20 MG tablet Take 20 mg by mouth daily.    [provider]  bisacodyl (DULCOLAX) 10 MG suppository Place 10 mg rectally as needed for moderate constipation.    [provider]  calcium carbonate (TUMS - DOSED IN MG ELEMENTAL CALCIUM) 500 MG chewable tablet Chew 2 tablets by mouth every 6 (six) hours as  needed for indigestion or heartburn.    [provider]  Docusate Sodium (STOOL SOFTENER LAXATIVE PO) Take 2 tablets by mouth at bedtime. Patient not taking: Reported on 01/30/2021    [provider]  Emollient (CETAPHIL) cream Apply 1 application topically as needed.    [provider]  feeding supplement, GLUCERNA SHAKE, (GLUCERNA SHAKE) LIQD Take 237 mLs by mouth 2 (two) times daily between meals. 11/01/20   Enzo Bi, MD  fluticasone (FLONASE) 50 MCG/ACT nasal spray Place 1 spray into both nostrils daily.    [provider]  guaiFENesin (MUCINEX) 600 MG 12 hr tablet Take 1 tablet (600 mg total) by mouth 2 (two) times daily. 01/22/21   Sidney Ace, MD  hydrOXYzine (ATARAX/VISTARIL) 25 MG tablet Take 25 mg by mouth 2 (two) times daily as needed for itching. 02/17/20   [provider]  metFORMIN (GLUCOPHAGE-XR) 500 MG 24 hr tablet Take 500 mg by mouth daily.     [provider]  SARNA lotion  Apply topically. 02/25/20   [provider]  traZODone (DESYREL) 100 MG tablet Take 100 mg by mouth at bedtime.    [provider]    Allergies:  Allergies as of 01/29/2021   (No Known Allergies)    ROS:  General: Denies weight loss, weight gain, fatigue, fevers, chills, and night sweats. Eyes: Denies blurry vision, double vision, eye pain, itchy eyes, and tearing. Ears: Denies hearing loss, earache, and ringing in ears. Nose: Denies sinus pain, congestion, infections, runny nose, and nosebleeds. Mouth/throat: Denies hoarseness, sore throat, bleeding gums, and difficulty swallowing. Heart: Denies chest pain, palpitations, racing heart, irregular heartbeat, leg pain or swelling, and decreased activity tolerance. Respiratory: Denies breathing difficulty, shortness of breath, wheezing, cough, and sputum. GI: Denies change in appetite, heartburn, nausea, vomiting, constipation, diarrhea, and blood in stool. GU: Denies difficulty  urinating, pain with urinating, urgency, frequency, blood in urine. Musculoskeletal: Denies joint stiffness, pain, swelling, muscle weakness. Skin: Denies rash, itching, mass, tumors, sores, and boils Neurologic: Denies headache, fainting, dizziness, seizures, numbness, and tingling. Psychiatric: Denies depression, anxiety, difficulty sleeping, and memory loss. Endocrine: Denies heat or cold intolerance, and increased thirst or urination. Blood/lymph: Denies easy bruising, easy bruising, and swollen glands     Objective:     BP 104/75 (BP Location: Left Arm)   Pulse 81   Temp 98.6 F (37 C) (Oral)   Resp 18   Ht 6' (1.829 m)   Wt 104.5 kg   SpO2 98%   BMI 31.25 kg/m   Constitutional :  alert, cooperative, appears stated age, and no distress  Lymphatics/Throat:  no asymmetry, masses, or scars  Respiratory:  clear to auscultation bilaterally  Cardiovascular:  regular rate and rhythm  Gastrointestinal: Extremely distended but soft abdomen with 0 tenderness to palpation despite deep palpation in all 4 quadrants.  Tympany noted on exam with no guarding .  Suprapubic Catheter in place  Musculoskeletal: Steady movement  Skin: Cool and moist, visible surgical scars.  Stage II decubitus ulcers noted around the gluteal region with minor skin irritation surrounding it  Psychiatric: Normal affect, non-agitated, not confused  Rectal: DRE noted palpable large stool ball within the rectal vault that is pasty in nature.  Attempted manual disimpaction was unsuccessful due to the large stool burden and inability to get around it.    LABS:  CMP Latest Ref Rng & Units 01/30/2021 01/29/2021 01/17/2021  Glucose 70 - 99 mg/dL - 161(W191(H) 960(A255(H)  BUN 8 - 23 mg/dL - 54(U47(H) 98(J28(H)  Creatinine 0.61 - 1.24 mg/dL 1.911.24 4.78(G1.45(H) 9.561.03  Sodium 135 - 145 mmol/L - 136 137  Potassium 3.5 - 5.1 mmol/L - 4.0 4.1  Chloride 98 - 111 mmol/L - 101 103  CO2 22 - 32 mmol/L - 26 27  Calcium 8.9 - 10.3 mg/dL - 9.2 2.1(H8.7(L)   Total Protein 6.5 - 8.1 g/dL - 7.1 -  Total Bilirubin 0.3 - 1.2 mg/dL - 0.8(M1.4(H) -  Alkaline Phos 38 - 126 U/L - 73 -  AST 15 - 41 U/L - 13(L) -  ALT 0 - 44 U/L - 9 -   CBC Latest Ref Rng & Units 01/30/2021 01/29/2021 01/22/2021  WBC 4.0 - 10.5 K/uL 12.3(H) 15.8(H) 13.5(H)  Hemoglobin 13.0 - 17.0 g/dL 57.814.7 46.916.6 62.913.7  Hematocrit 39.0 - 52.0 % 43.3 47.0 41.6  Platelets 150 - 400 K/uL 198 287 272    RADS: CLINICAL DATA:  Abdominal distension   EXAM: CT ABDOMEN AND PELVIS WITH CONTRAST  TECHNIQUE: Multidetector CT imaging of the abdomen and pelvis was performed using the standard protocol following bolus administration of intravenous contrast.   CONTRAST:  163mL OMNIPAQUE IOHEXOL 300 MG/ML  SOLN   COMPARISON:  Earlier today   FINDINGS: Lower chest: Bibasilar scarring or atelectasis. Heart is upper limits normal in size. Mitral valve and coronary artery calcifications again noted.   Hepatobiliary: Layering stones within the gallbladder, stable. Stable small cyst in the posterior right hepatic lobe. No biliary ductal dilatation.   Pancreas: No focal abnormality or ductal dilatation.   Spleen: No focal abnormality.  Normal size.   Adrenals/Urinary Tract: Bilateral cortical thinning. Small exophytic cyst off the midpole of left kidney, stable. No stones or hydronephrosis. Adrenal glands unremarkable. Suprapubic catheter in the bladder which is decompressed.   Stomach/Bowel: Large stool burden throughout the colon, most pronounced in the rectum. Cannot exclude fecal impaction. No evidence of bowel obstruction.   Vascular/Lymphatic: Aortic atherosclerosis. No evidence of aneurysm or adenopathy.   Reproductive: No visible focal abnormality.   Other: No free fluid or free air.   Musculoskeletal: Bilateral hip replacements. Degenerative changes in the lumbar spine. No acute bony abnormality.   IMPRESSION: Large stool burden in the rectum and throughout the colon.  Cannot exclude fecal impaction.   Cholelithiasis.   Aortic atherosclerosis.   Bibasilar scarring or atelectasis.   No change since earlier today.     Electronically Signed   By: Rolm Baptise M.D.   On: 01/30/2021 20:32 Assessment:   Abdominal distention and obstipation, fecal impaction  Plan:    Despite the degree of abdominal distention and fecal impaction noted on the CT scan as noted above, patient remains completely asymptomatic at the time of exam and states he has no concerns of nausea vomiting or any discomfort.  Manual disimpaction was attempted personally by myself but unsuccessful.  Recommend aggressive soap water suds enemas and suppositories, to loosen stool burden to see if the patient can have a bowel movement to relieve the pressure.  Recommend staying on clear liquid diet until an adequate bowel movement is documented for concerns of continuing distention with solid food intake could potentially lead to perforation.  Also recommend patient be placed on a bowel regimen to ensure he continues to have regular bowel movements to prevent these issues in the future.  Review of previous CT scan images personally indicates this may be a chronic issue for the patient despite his lack of awareness.  Local skin care for stage II pressure ulcers on his gluteal region.  Also [facility] sure [facility] so that [facility] can be approved.  Surgery will continue to follow until patient has recorded bowel movement.  UPDATE: second attempt at manual disimpaction performed by myself noted some pasty stool evacuation, minimal, with some flatus during the procedure.  Recommend continuing aggressive bowel regimen to clear colon.

## 2021-01-30 NOTE — Assessment & Plan Note (Signed)
The patient takes FeSO4 325 mg tid at home. This probably contributes a great deal to his chronic constipation.

## 2021-01-30 NOTE — Assessment & Plan Note (Signed)
Continue tramadol 100 mg bid as at home.

## 2021-01-30 NOTE — Assessment & Plan Note (Signed)
Noted. Continue Hydroxyzine as at home.

## 2021-01-31 ENCOUNTER — Observation Stay: Payer: No Typology Code available for payment source

## 2021-01-31 DIAGNOSIS — Z79899 Other long term (current) drug therapy: Secondary | ICD-10-CM | POA: Diagnosis not present

## 2021-01-31 DIAGNOSIS — Z20822 Contact with and (suspected) exposure to covid-19: Secondary | ICD-10-CM | POA: Diagnosis present

## 2021-01-31 DIAGNOSIS — Z7951 Long term (current) use of inhaled steroids: Secondary | ICD-10-CM | POA: Diagnosis not present

## 2021-01-31 DIAGNOSIS — G20 Parkinson's disease: Secondary | ICD-10-CM | POA: Diagnosis present

## 2021-01-31 DIAGNOSIS — N179 Acute kidney failure, unspecified: Secondary | ICD-10-CM | POA: Diagnosis present

## 2021-01-31 DIAGNOSIS — D509 Iron deficiency anemia, unspecified: Secondary | ICD-10-CM | POA: Diagnosis present

## 2021-01-31 DIAGNOSIS — Z87891 Personal history of nicotine dependence: Secondary | ICD-10-CM | POA: Diagnosis not present

## 2021-01-31 DIAGNOSIS — K5641 Fecal impaction: Secondary | ICD-10-CM | POA: Diagnosis present

## 2021-01-31 DIAGNOSIS — L89312 Pressure ulcer of right buttock, stage 2: Secondary | ICD-10-CM | POA: Diagnosis present

## 2021-01-31 DIAGNOSIS — K5900 Constipation, unspecified: Secondary | ICD-10-CM | POA: Diagnosis not present

## 2021-01-31 DIAGNOSIS — F419 Anxiety disorder, unspecified: Secondary | ICD-10-CM | POA: Diagnosis present

## 2021-01-31 DIAGNOSIS — G8929 Other chronic pain: Secondary | ICD-10-CM | POA: Diagnosis present

## 2021-01-31 DIAGNOSIS — R600 Localized edema: Secondary | ICD-10-CM | POA: Diagnosis present

## 2021-01-31 DIAGNOSIS — D696 Thrombocytopenia, unspecified: Secondary | ICD-10-CM | POA: Diagnosis present

## 2021-01-31 DIAGNOSIS — I10 Essential (primary) hypertension: Secondary | ICD-10-CM | POA: Diagnosis present

## 2021-01-31 DIAGNOSIS — J9601 Acute respiratory failure with hypoxia: Secondary | ICD-10-CM | POA: Diagnosis present

## 2021-01-31 DIAGNOSIS — Z7984 Long term (current) use of oral hypoglycemic drugs: Secondary | ICD-10-CM | POA: Diagnosis not present

## 2021-01-31 DIAGNOSIS — Z96643 Presence of artificial hip joint, bilateral: Secondary | ICD-10-CM | POA: Diagnosis present

## 2021-01-31 DIAGNOSIS — Z7901 Long term (current) use of anticoagulants: Secondary | ICD-10-CM | POA: Diagnosis not present

## 2021-01-31 DIAGNOSIS — J441 Chronic obstructive pulmonary disease with (acute) exacerbation: Secondary | ICD-10-CM | POA: Diagnosis present

## 2021-01-31 DIAGNOSIS — L89322 Pressure ulcer of left buttock, stage 2: Secondary | ICD-10-CM | POA: Diagnosis present

## 2021-01-31 DIAGNOSIS — R140 Abdominal distension (gaseous): Secondary | ICD-10-CM | POA: Diagnosis present

## 2021-01-31 DIAGNOSIS — E1140 Type 2 diabetes mellitus with diabetic neuropathy, unspecified: Secondary | ICD-10-CM | POA: Diagnosis present

## 2021-01-31 DIAGNOSIS — E785 Hyperlipidemia, unspecified: Secondary | ICD-10-CM | POA: Diagnosis present

## 2021-01-31 DIAGNOSIS — I480 Paroxysmal atrial fibrillation: Secondary | ICD-10-CM | POA: Diagnosis present

## 2021-01-31 LAB — COMPREHENSIVE METABOLIC PANEL
ALT: 18 U/L (ref 0–44)
AST: 11 U/L — ABNORMAL LOW (ref 15–41)
Albumin: 3.1 g/dL — ABNORMAL LOW (ref 3.5–5.0)
Alkaline Phosphatase: 56 U/L (ref 38–126)
Anion gap: 8 (ref 5–15)
BUN: 34 mg/dL — ABNORMAL HIGH (ref 8–23)
CO2: 27 mmol/L (ref 22–32)
Calcium: 8.5 mg/dL — ABNORMAL LOW (ref 8.9–10.3)
Chloride: 103 mmol/L (ref 98–111)
Creatinine, Ser: 1 mg/dL (ref 0.61–1.24)
GFR, Estimated: >60 mL/min (ref >60)
Glucose, Bld: 121 mg/dL — ABNORMAL HIGH (ref 70–99)
Potassium: 3.4 mmol/L — ABNORMAL LOW (ref 3.5–5.1)
Sodium: 138 mmol/L (ref 135–145)
Total Bilirubin: 1.1 mg/dL (ref 0.3–1.2)
Total Protein: 5.5 g/dL — ABNORMAL LOW (ref 6.5–8.1)

## 2021-01-31 LAB — CBC
HCT: 40 % (ref 39.0–52.0)
Hemoglobin: 14 g/dL (ref 13.0–17.0)
MCH: 32.7 pg (ref 26.0–34.0)
MCHC: 35 g/dL (ref 30.0–36.0)
MCV: 93.5 fL (ref 80.0–100.0)
Platelets: 174 10*3/uL (ref 150–400)
RBC: 4.28 MIL/uL (ref 4.22–5.81)
RDW: 18.1 % — ABNORMAL HIGH (ref 11.5–15.5)
WBC: 9.2 10*3/uL (ref 4.0–10.5)
nRBC: 0.2 % (ref 0.0–0.2)

## 2021-01-31 LAB — GLUCOSE, CAPILLARY
Glucose-Capillary: 116 mg/dL — ABNORMAL HIGH (ref 70–99)
Glucose-Capillary: 118 mg/dL — ABNORMAL HIGH (ref 70–99)
Glucose-Capillary: 135 mg/dL — ABNORMAL HIGH (ref 70–99)
Glucose-Capillary: 141 mg/dL — ABNORMAL HIGH (ref 70–99)
Glucose-Capillary: 154 mg/dL — ABNORMAL HIGH (ref 70–99)
Glucose-Capillary: 166 mg/dL — ABNORMAL HIGH (ref 70–99)

## 2021-01-31 LAB — MAGNESIUM: Magnesium: 2.5 mg/dL — ABNORMAL HIGH (ref 1.7–2.4)

## 2021-01-31 MED ORDER — FLEET ENEMA 7-19 GM/118ML RE ENEM: 1.0000 | ENEMA | Freq: Every day | RECTAL | Status: DC | PRN

## 2021-01-31 MED ORDER — POLYETHYLENE GLYCOL 3350 17 G PO PACK: 17.0000 g | PACK | Freq: Three times a day (TID) | ORAL | Status: DC

## 2021-01-31 MED ORDER — MINERAL OIL RE ENEM
1.0000 | ENEMA | Freq: Two times a day (BID) | RECTAL | Status: DC
  Administered 2021-01-31 – 2021-02-04 (×4): 1 via RECTAL

## 2021-01-31 MED ORDER — POTASSIUM CHLORIDE 10 MEQ/100ML IV SOLN
10.0000 meq | INTRAVENOUS | Status: AC
  Administered 2021-01-31 – 2021-02-01 (×4): 10 meq via INTRAVENOUS
  Filled 2021-01-31 (×4): qty 100

## 2021-01-31 MED ORDER — SIMETHICONE 80 MG PO CHEW
80.0000 mg | CHEWABLE_TABLET | Freq: Four times a day (QID) | ORAL | Status: DC
  Administered 2021-01-31 – 2021-02-05 (×18): 80 mg via ORAL
  Filled 2021-01-31 (×20): qty 1

## 2021-01-31 NOTE — TOC Initial Note (Addendum)
Transition of Care [facility]) - Initial/Assessment Note    Patient Details  Name: Cammeron Greis MRN: [id number] Date of Birth: [birth year]  [facility]) CM/SW Contact:    Chapman Fitch, RN Phone Number: 01/31/2021, 2:43 PM  Clinical Narrative:                  Patient admitted from [facility] Patient confirms his wishes are to return  [facility] notified [facility] - notification # [id number] If patient changes to Inpatient TOC well need to call notification center back Expected Discharge Plan: Skilled Nursing Facility Barriers to Discharge: Continued Medical Work up   Patient Goals and CMS Choice        Expected Discharge Plan and Services Expected Discharge Plan: Skilled Nursing Facility                                              Prior Living Arrangements/Services     Patient language and need for interpreter reviewed:: Yes Do you feel safe going back to the place where you live?: Yes      Need for Family Participation in Patient Care: Yes (Comment) Care giver support system in place?: Yes (comment)   Criminal Activity/Legal Involvement Pertinent to Current Situation/Hospitalization: No - Comment as needed  Activities of Daily Living Home Assistive Devices/Equipment: Wheelchair ADL Screening (condition at time of admission) Patient's cognitive ability adequate to safely complete daily activities?: Yes Is the patient deaf or have difficulty hearing?: No Does the patient have difficulty seeing, even when wearing glasses/contacts?: No Does the patient have difficulty concentrating, remembering, or making decisions?: No Patient able to express need for assistance with ADLs?: Yes Does the patient have difficulty dressing or bathing?: Yes Independently performs ADLs?: No Communication: Independent Dressing (OT): Needs assistance Is this a change from baseline?: Pre-admission baseline Grooming: Needs assistance Is this a change from  baseline?: Pre-admission baseline Feeding: Needs assistance Is this a change from baseline?: Pre-admission baseline Bathing: Dependent Is this a change from baseline?: Pre-admission baseline Toileting: Dependent Is this a change from baseline?: Pre-admission baseline In/Out Bed: Dependent Is this a change from baseline?: Pre-admission baseline Walks in Home: Dependent Is this a change from baseline?: Pre-admission baseline Does the patient have difficulty walking or climbing stairs?: Yes Weakness of Legs: Both Weakness of Arms/Hands: Both  Permission Sought/Granted                  Emotional Assessment       Orientation: : Oriented to Self, Oriented to Place, Oriented to  Time, Oriented to Situation Alcohol / Substance Use: Not Applicable Psych Involvement: No (comment)  Admission diagnosis:  Abdominal distension [R14.0] Ileus (HCC) [K56.7] Obstipation [K59.00] Patient Active Problem List   Diagnosis Date Noted   Obstipation 01/30/2021   Anxiety 01/30/2021   Iron deficiency anemia    Edema leg    Respiratory failure (HCC) 01/09/2021   Ileus (HCC)    Neck pain    AKI (acute kidney injury) (HCC)    Type 2 diabetes mellitus with diabetic neuropathy, without long-term current use of insulin (HCC)    Parkinson disease (HCC)    Sepsis (HCC) 10/16/2020   Chronic respiratory failure with hypoxia (HCC) 01/10/2020   Impaired functional mobility, balance, gait, and endurance 09/15/2019   Catheter cystitis (HCC) 10/16/2018   COVID-19 virus detected 07/03/2018  Benign prostatic hyperplasia with lower urinary tract symptoms 08/06/2017   Osteoarthritis of right knee 06/19/2017   S/P orthopedic surgery, follow-up exam 10/25/2016   Tendon rupture of wrist, sequela 01/02/2016   Acute pain of right knee 08/03/2015   Calcific tendinitis of left shoulder 07/05/2015   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/05/2015   Gait difficulty 06/26/2015   Severe mitral regurgitation  06/05/2015   AF (paroxysmal atrial fibrillation) (HCC) 05/31/2015   Chronic pain 05/31/2015   COPD with acute exacerbation (HCC) 05/31/2015   Essential hypertension 05/31/2015   Chronic pain of left wrist 04/24/2015   Arthritis of left wrist 04/24/2015   Rupture of extensor tendon of left hand 04/24/2015   PCP:  [facility]   [facility] - [place], [place] - [address] [place] [address] Phone: [phone] Fax: [phone]     Social Determinants of Health (SDOH) Interventions    Readmission Risk Interventions Readmission Risk Prevention Plan 01/12/2021 10/20/2020  Transportation Screening Complete Complete  PCP or Specialist Appt within 3-5 Days - Complete  HRI or Home Care Consult - Complete  Palliative Care Screening - Not Applicable  Medication Review (RN Care Manager) Complete Referral to Pharmacy  PCP or Specialist appointment within 3-5 days of discharge Complete -  HRI or Home Care Consult Complete -  SW Recovery Care/Counseling Consult Complete -  Palliative Care Screening Not Applicable -  Skilled Nursing Facility Complete -

## 2021-01-31 NOTE — Progress Notes (Addendum)
PROGRESS NOTE    Raymond Hays  [id number] DOB: 11/03/1945 DOA: 01/30/2021 PCP: [facility]   Chief Complaint  Patient presents with   Abdominal Pain   Brief Narrative:  75 yo gentleman with hx atrial fibrillation, COPD, chronic constipation, iron def, parkinsons, HTN, and multiple other medical problems presenting with abdominal distension found to have large stool burden in rectum and throughout colon on imaging.  Assessment & Plan:   Principal Problem:   Obstipation Active Problems:   AF (paroxysmal atrial fibrillation) (HCC)   Chronic pain   COPD with acute exacerbation (HCC)   Essential hypertension   AKI (acute kidney injury) ([place])   Type 2 diabetes mellitus with diabetic neuropathy, without long-term current use of insulin (HCC)   Parkinson disease (HCC)   Respiratory failure (HCC)   Anxiety   Iron deficiency anemia   Edema leg  Obstipation  Abdominal Distension CT with large stool burden in rectum and throughout colon Appreciate surgery recommendations - soap suds enemas, suppositories, clear liquid diet until adequate bowel movement, bowel regimen - concern this maybe Klaire Court more chronic issue He initially was passing some stool per RN, but has subsequently developed worsening distension again Repeat KUB with diffuse gaseous distention of bowel in abdomen with large stool burden in rectum - with worsening distension, will make NPO with meds, continue bowel regimen, soap suds  Acute Hypoxic Respiratory Failure Now on RA, suspect degree of SOB related to abdominal distension CXR today without acute disease Continue flutter valve SLP eval - lots of transmitted upper airway sounds  Atrial Fibrillation Continue metoprolol, amiodarone and eliqus  AKI Resolved, follow with IVF  Anxiety Hydroxyzine  Chronic Pain Tramadol  Parkinson's Sinemet  COPD with exacerbation Transmitted upper airway sounds as noted today, not clearly wheezing Continue albuterol,  trelegy, and duonebs Hold off of steroids for now  T2DM Hold metformin Continue SSI  Iron Def Anemia Stop iron  Hypertension Continue metoprolol  Lower Extremity Edema of RLE Chronic, follow  DVT prophylaxis: eliquis Code Status: full  Family Communication: none at bedside - called sister, no answer Disposition:   Status is: Observation  The patient will require care spanning > 2 midnights and should be moved to inpatient because: continued abdominal distension and pain      Consultants:  General surgery  Procedures:  none  Antimicrobials:  Anti-infectives (From admission, onward)    None          Subjective: C/o abdominal distension  Objective: Vitals:   01/31/21 0734 01/31/21 0800 01/31/21 1552 01/31/21 1625  BP: 111/87  127/85   Pulse:   96   Resp:      Temp: 97.8 F (36.6 C)  97.7 F (36.5 C)   TempSrc: Oral     SpO2:  95% 95% 96%  Weight:      Height:        Intake/Output Summary (Last 24 hours) at 01/31/2021 1856 Last data filed at 01/31/2021 1812 Gross per 24 hour  Intake 2772.11 ml  Output 1426 ml  Net 1346.11 ml   Filed Weights   01/29/21 1802  Weight: 104.5 kg    Examination:  General exam: Appears calm and comfortable  Respiratory system: coarse transmitted upper airway sounds Cardiovascular system: RRR Gastrointestinal system: distended, tense, nontender Central nervous system: Alert and oriented. No focal neurological deficits. Extremities: no LEE    Data Reviewed: I have personally reviewed following labs and imaging studies  CBC: Recent Labs  Lab 01/29/21 1758 01/30/21  1450 01/31/21 0503  WBC 15.8* 12.3* 9.2  NEUTROABS 13.1*  --   --   HGB 16.6 14.7 14.0  HCT 47.0 43.3 40.0  MCV 92.7 94.1 93.5  PLT 287 198 174    Basic Metabolic Panel: Recent Labs  Lab 01/29/21 1758 01/30/21 1450 01/31/21 0503  NA 136  --  138  K 4.0  --  3.4*  CL 101  --  103  CO2 26  --  27  GLUCOSE 191*  --  121*  BUN 47*   --  34*  CREATININE 1.45* 1.24 1.00  CALCIUM 9.2  --  8.5*    GFR: Estimated Creatinine Clearance: 77.4 mL/min (by C-G formula based on SCr of 1 mg/dL).  Liver Function Tests: Recent Labs  Lab 01/29/21 1758 01/31/21 0503  AST 13* 11*  ALT 9 18  ALKPHOS 73 56  BILITOT 1.4* 1.1  PROT 7.1 5.5*  ALBUMIN 4.0 3.1*    CBG: Recent Labs  Lab 01/30/21 2320 01/31/21 0413 01/31/21 0733 01/31/21 1125 01/31/21 1747  GLUCAP 141* 116* 118* 166* 135*     Recent Results (from the past 240 hour(s))  Resp Panel by RT-PCR (Flu Jeraldean Wechter&B, Covid) Nasopharyngeal Swab     Status: None   Collection Time: 01/22/21 10:37 AM   Specimen: Nasopharyngeal Swab; Nasopharyngeal(NP) swabs in vial transport medium  Result Value Ref Range Status   SARS Coronavirus 2 by RT PCR NEGATIVE NEGATIVE Final    Comment: (NOTE) SARS-CoV-2 target nucleic acids are NOT DETECTED.  The SARS-CoV-2 RNA is generally detectable in upper respiratory specimens during the acute phase of infection. The lowest concentration of SARS-CoV-2 viral copies this assay can detect is 138 copies/mL. Kiele Heavrin negative result does not preclude SARS-Cov-2 infection and should not be used as the sole basis for treatment or other patient management decisions. Jalila Goodnough negative result may occur with  improper specimen collection/handling, submission of specimen other than nasopharyngeal swab, presence of viral mutation(s) within the areas targeted by this assay, and inadequate number of viral copies(<138 copies/mL). Seona Clemenson negative result must be combined with clinical observations, patient history, and epidemiological information. The expected result is Negative.  Fact Sheet for Patients:  BloggerCourse.com  Fact Sheet for Healthcare Providers:  SeriousBroker.it  This test is no t yet approved or cleared by the [place] FDA and  has been authorized for detection and/or diagnosis of SARS-CoV-2 by FDA  under an Emergency Use Authorization (EUA). This EUA will remain  in effect (meaning this test can be used) for the duration of the COVID-19 declaration under Section 564(b)(1) of the Act, 21 U.S.C.section 360bbb-3(b)(1), unless the authorization is terminated  or revoked sooner.       Influenza Yaiden Yang by PCR NEGATIVE NEGATIVE Final   Influenza B by PCR NEGATIVE NEGATIVE Final    Comment: (NOTE) The Xpert Xpress SARS-CoV-2/FLU/RSV plus assay is intended as an aid in the diagnosis of influenza from Nasopharyngeal swab specimens and should not be used as Dreanna Kyllo sole basis for treatment. Nasal washings and aspirates are unacceptable for Xpert Xpress SARS-CoV-2/FLU/RSV testing.  Fact Sheet for Patients: BloggerCourse.com  Fact Sheet for Healthcare Providers: SeriousBroker.it  This test is not yet approved or cleared by the [place] FDA and has been authorized for detection and/or diagnosis of SARS-CoV-2 by FDA under an Emergency Use Authorization (EUA). This EUA will remain in effect (meaning this test can be used) for the duration of the COVID-19 declaration under Section 564(b)(1) of the Act, 21 U.S.C. section  360bbb-3(b)(1), unless the authorization is terminated or revoked.  Performed at [facility], [address] [place], [place] [address]   Resp Panel by RT-PCR (Flu Bria Portales&B, Covid) Nasopharyngeal Swab     Status: None   Collection Time: 01/30/21 12:21 AM   Specimen: Nasopharyngeal Swab; Nasopharyngeal(NP) swabs in vial transport medium  Result Value Ref Range Status   SARS Coronavirus 2 by RT PCR NEGATIVE NEGATIVE Final    Comment: (NOTE) SARS-CoV-2 target nucleic acids are NOT DETECTED.  The SARS-CoV-2 RNA is generally detectable in upper respiratory specimens during the acute phase of infection. The lowest concentration of SARS-CoV-2 viral copies this assay can detect is 138 copies/mL. Feleshia Zundel negative result does not  preclude SARS-Cov-2 infection and should not be used as the sole basis for treatment or other patient management decisions. Bransyn Adami negative result may occur with  improper specimen collection/handling, submission of specimen other than nasopharyngeal swab, presence of viral mutation(s) within the areas targeted by this assay, and inadequate number of viral copies(<138 copies/mL). Kallee Nam negative result must be combined with clinical observations, patient history, and epidemiological information. The expected result is Negative.  Fact Sheet for Patients:  EntrepreneurPulse.com.au  Fact Sheet for Healthcare Providers:  IncredibleEmployment.be  This test is no t yet approved or cleared by the [place] FDA and  has been authorized for detection and/or diagnosis of SARS-CoV-2 by FDA under an Emergency Use Authorization (EUA). This EUA will remain  in effect (meaning this test can be used) for the duration of the COVID-19 declaration under Section 564(b)(1) of the Act, 21 U.S.C.section 360bbb-3(b)(1), unless the authorization is terminated  or revoked sooner.       Influenza Treylan Mcclintock by PCR NEGATIVE NEGATIVE Final   Influenza B by PCR NEGATIVE NEGATIVE Final    Comment: (NOTE) The Xpert Xpress SARS-CoV-2/FLU/RSV plus assay is intended as an aid in the diagnosis of influenza from Nasopharyngeal swab specimens and should not be used as Isak Sotomayor sole basis for treatment. Nasal washings and aspirates are unacceptable for Xpert Xpress SARS-CoV-2/FLU/RSV testing.  Fact Sheet for Patients: EntrepreneurPulse.com.au  Fact Sheet for Healthcare Providers: IncredibleEmployment.be  This test is not yet approved or cleared by the [place] FDA and has been authorized for detection and/or diagnosis of SARS-CoV-2 by FDA under an Emergency Use Authorization (EUA). This EUA will remain in effect (meaning this test can be used) for the  duration of the COVID-19 declaration under Section 564(b)(1) of the Act, 21 U.S.C. section 360bbb-3(b)(1), unless the authorization is terminated or revoked.  Performed at [facility], [address] [place], [place] [address]          Radiology Studies: DG Abd 1 View  Result Date: 01/31/2021 CLINICAL DATA:  Abdominal distension EXAM: ABDOMEN - 1 VIEW COMPARISON:  CT abdomen and pelvis 01/30/2021 FINDINGS: There is diffuse gaseous distension of bowel in the abdomen, with rectal stool ball present, and no definable transition point. Bilateral hip arthroplasties. IMPRESSION: Diffuse gaseous distention of bowel in the abdomen, with large stool burden in the rectum. Electronically Signed   By: Maurine Simmering M.D.   On: 01/31/2021 17:00   CT ABDOMEN PELVIS W CONTRAST  Result Date: 01/30/2021 CLINICAL DATA:  Abdominal distension EXAM: CT ABDOMEN AND PELVIS WITH CONTRAST TECHNIQUE: Multidetector CT imaging of the abdomen and pelvis was performed using the standard protocol following bolus administration of intravenous contrast. CONTRAST:  146mL OMNIPAQUE IOHEXOL 300 MG/ML  SOLN COMPARISON:  Earlier today FINDINGS: Lower chest: Bibasilar scarring or atelectasis.  Heart is upper limits normal in size. Mitral valve and coronary artery calcifications again noted. Hepatobiliary: Layering stones within the gallbladder, stable. Stable small cyst in the posterior right hepatic lobe. No biliary ductal dilatation. Pancreas: No focal abnormality or ductal dilatation. Spleen: No focal abnormality.  Normal size. Adrenals/Urinary Tract: Bilateral cortical thinning. Small exophytic cyst off the midpole of left kidney, stable. No stones or hydronephrosis. Adrenal glands unremarkable. Suprapubic catheter in the bladder which is decompressed. Stomach/Bowel: Large stool burden throughout the colon, most pronounced in the rectum. Cannot exclude fecal impaction. No evidence of bowel obstruction. Vascular/Lymphatic:  Aortic atherosclerosis. No evidence of aneurysm or adenopathy. Reproductive: No visible focal abnormality. Other: No free fluid or free air. Musculoskeletal: Bilateral hip replacements. Degenerative changes in the lumbar spine. No acute bony abnormality. IMPRESSION: Large stool burden in the rectum and throughout the colon. Cannot exclude fecal impaction. Cholelithiasis. Aortic atherosclerosis. Bibasilar scarring or atelectasis. No change since earlier today. Electronically Signed   By: Rolm Baptise M.D.   On: 01/30/2021 20:32   CT Abdomen Pelvis W Contrast  Result Date: 01/29/2021 CLINICAL DATA:  Bowel obstruction suspected EXAM: CT ABDOMEN AND PELVIS WITH CONTRAST TECHNIQUE: Multidetector CT imaging of the abdomen and pelvis was performed using the standard protocol following bolus administration of intravenous contrast. CONTRAST:  135mL OMNIPAQUE IOHEXOL 300 MG/ML  SOLN COMPARISON:  CT abdomen pelvis mental status Q FINDINGS: Lower chest: No acute abnormality. Mitral annular calcifications. Coronary artery calcifications. Tiny hiatal hernia. Hepatobiliary: There is Jaydon Soroka 1.8 x 1.3 cm fluid density lesion within the right hepatic lobe likely represents Federico Maiorino simple hepatic cyst. No focal liver abnormality. Layering hyperdensity within the gallbladder lumen. Otherwise no gallbladder wall thickening or pericholecystic fluid. No biliary dilatation. Pancreas: No focal lesion. Normal pancreatic contour. No surrounding inflammatory changes. No main pancreatic ductal dilatation. Spleen: Normal in size without focal abnormality. Adrenals/Urinary Tract: No adrenal nodule bilaterally. Bilateral kidneys enhance symmetrically. 2.2 cm fluid density lesion within left kidney likely represents simple renal cysts. Subcentimeter hypodensities are too small to characterize. No hydronephrosis. No hydroureter.  Punctate left nephrolithiasis. Suprapubic catheter with tip and balloon terminate within the urinary bladder lumen. Associated  foci of gas within the lumen of the urinary bladder. Stomach/Bowel: Stomach is within normal limits. No evidence of bowel wall thickening or dilatation. Stool throughout the ascending colon. Stool within the rectum. Few scattered colonic diverticula. The appendix not definitely identified. Vascular/Lymphatic: No abdominal aorta or iliac aneurysm. Mild atherosclerotic plaque of the aorta and its branches. No abdominal, pelvic, or inguinal lymphadenopathy. Reproductive: Prostate is unremarkable. Other: No intraperitoneal free fluid. No intraperitoneal free gas. No organized fluid collection. Musculoskeletal: No abdominal wall hernia or abnormality. No suspicious lytic or blastic osseous lesions. No acute displaced fracture. Multilevel severe degenerative changes of the spine with associated multilevel severe osseous neural foraminal stenosis. Partially visualized bilateral total hip arthroplasties. IMPRESSION: 1. Cholelithiasis no findings of acute cholecystitis. 2. Few scattered colonic diverticula with no acute diverticulitis. 3. Stool within the rectum with no associated stercoral colitis. 4. Nonobstructive punctate left nephrolithiasis 5. Aortic Atherosclerosis (ICD10-I70.0) as well as mitral annular and coronary calcifications. Electronically Signed   By: Iven Finn M.D.   On: 01/29/2021 20:03   DG Chest Port 1 View  Result Date: 01/31/2021 CLINICAL DATA:  Abdominal pain.  COPD. EXAM: PORTABLE CHEST 1 VIEW COMPARISON:  01/29/2021 and prior exams. FINDINGS: Cardiac silhouette is mildly enlarged. No mediastinal or hilar masses. Low lung volumes.  Allowing for this, lungs are clear.  No convincing pleural effusion or pneumothorax. Skeletal structures are grossly intact. IMPRESSION: No acute cardiopulmonary disease and no significant change from the most recent prior study. Electronically Signed   By: Lajean Manes M.D.   On: 01/31/2021 10:40        Scheduled Meds:  amiodarone  400 mg Oral Daily    apixaban  5 mg Oral BID   bisacodyl  10 mg Rectal Once   bisacodyl  10 mg Rectal Daily   carbidopa-levodopa  1 tablet Oral TID AC   escitalopram  5 mg Oral Daily   feeding supplement (GLUCERNA SHAKE)  237 mL Oral BID BM   finasteride  5 mg Oral Daily   fluticasone furoate-vilanterol  1 puff Inhalation Daily   And   umeclidinium bromide  1 puff Inhalation Daily   gabapentin  800 mg Oral BID   hydrocerin  1 application Topical BID   insulin aspart  0-20 Units Subcutaneous Q4H   ipratropium  2 spray Each Nare BID   lactase  3,000 Units Oral TID WC   pantoprazole  40 mg Oral BID AC   polyethylene glycol  17 g Oral TID   psyllium  1 packet Oral Daily   senna  2 tablet Oral QHS   traZODone  100 mg Oral QHS   triamcinolone cream  1 application Topical BID   Continuous Infusions:  dextrose 5% lactated ringers 50 mL/hr at 01/31/21 1610     LOS: 0 days    Time spent: over 30 min    Fayrene Helper, MD [facility]   To contact the attending provider between 7A-7P or the covering provider during after hours 7P-7A, please log into the web site [email or web address] and access using universal [place] password for that web site. If you do not have the password, please call the hospital operator.  01/31/2021, 6:56 PM

## 2021-01-31 NOTE — Progress Notes (Signed)
PT Cancellation Note  Patient Details Name: Raymond Hays MRN: [id number] DOB: 24-Dec-1945   Cancelled Treatment:    Reason Eval/Treat Not Completed: Fatigue/lethargy limiting ability to participate. Attempted to see patient 2x this am. He reports he is very tired. Currently getting bath. Will re-attempt after lunch.    Abiha Lukehart 01/31/2021, 10:22 AM

## 2021-01-31 NOTE — Progress Notes (Signed)
Subjective:  CC: Raymond Hays is a 75 y.o. male  Hospital stay day 0,   dilated colon and retained stool  HPI: Notified of increasing pain and distention again despite recorded BMs.  No nausea yet.    ROS:  General: Denies weight loss, weight gain, fatigue, fevers, chills, and night sweats. Heart: Denies chest pain, palpitations, racing heart, irregular heartbeat, leg pain or swelling, and decreased activity tolerance. Respiratory: Denies breathing difficulty, shortness of breath, wheezing, cough, and sputum. GI: Denies change in appetite, heartburn, nausea, vomiting, constipation, diarrhea, and blood in stool. GU: Denies difficulty urinating, pain with urinating, urgency, frequency, blood in urine.   Objective:   Temp:  [97.7 F (36.5 C)-98.6 F (37 C)] 98.3 F (36.8 C) (11/02 1946) Pulse Rate:  [81-103] 96 (11/02 1946) Resp:  [16-18] 16 (11/02 1946) BP: (104-148)/(75-97) 148/97 (11/02 1946) SpO2:  [95 %-98 %] 95 % (11/02 1946)     Height: 6' (182.9 cm) Weight: 104.5 kg BMI (Calculated): 31.24   Intake/Output this shift:   Intake/Output Summary (Last 24 hours) at 01/31/2021 2021 Last data filed at 01/31/2021 1812 Gross per 24 hour  Intake 2772.11 ml  Output 1426 ml  Net 1346.11 ml    Constitutional :  alert, cooperative, appears stated age, and no distress  Respiratory:  clear to auscultation bilaterally  Cardiovascular:  irregularly irregular rhythm  Gastrointestinal: Increased distention again, with very tense but only minimal TTP on exam. .   Skin: Cool and moist.   Psychiatric: Normal affect, non-agitated, not confused  Rectal: DRE noted more stool but less noted in rectal vault compared to previous exam.  Pt had small amount of stool around his bottom at time of exam.    LABS:  CMP Latest Ref Rng & Units 01/31/2021 01/30/2021 01/29/2021  Glucose 70 - 99 mg/dL 121(H) - 191(H)  BUN 8 - 23 mg/dL 34(H) - 47(H)  Creatinine 0.61 - 1.24 mg/dL 1.00 1.24 1.45(H)  Sodium 135 -  145 mmol/L 138 - 136  Potassium 3.5 - 5.1 mmol/L 3.4(L) - 4.0  Chloride 98 - 111 mmol/L 103 - 101  CO2 22 - 32 mmol/L 27 - 26  Calcium 8.9 - 10.3 mg/dL 8.5(L) - 9.2  Total Protein 6.5 - 8.1 g/dL 5.5(L) - 7.1  Total Bilirubin 0.3 - 1.2 mg/dL 1.1 - 1.4(H)  Alkaline Phos 38 - 126 U/L 56 - 73  AST 15 - 41 U/L 11(L) - 13(L)  ALT 0 - 44 U/L 18 - 9   CBC Latest Ref Rng & Units 01/31/2021 01/30/2021 01/29/2021  WBC 4.0 - 10.5 K/uL 9.2 12.3(H) 15.8(H)  Hemoglobin 13.0 - 17.0 g/dL 14.0 14.7 16.6  Hematocrit 39.0 - 52.0 % 40.0 43.3 47.0  Platelets 150 - 400 K/uL 174 198 287    RADS: CLINICAL DATA:  Abdominal distension   EXAM: ABDOMEN - 1 VIEW   COMPARISON:  CT abdomen and pelvis 01/30/2021   FINDINGS: There is diffuse gaseous distension of bowel in the abdomen, with rectal stool ball present, and no definable transition point. Bilateral hip arthroplasties.   IMPRESSION: Diffuse gaseous distention of bowel in the abdomen, with large stool burden in the rectum.     Electronically Signed   By: Maurine Simmering M.D.   On: 01/31/2021 17:00 Assessment:   Stool impaction.  Increased distention again today with some increased pain reported, but abdominal exam remains with minimal TTP.  Manual disimpaction performed by myself again and some stool output noted.  Recommend switching to  mineral oil enemas and hold on miralax or other laxatives which maybe worsening the distention.  Simethicone around the clock for gaseous distention.  NPO until distention improves.  If condition does not improve over next day or so, may need to consider manual disimpaction in the operating room with more aggressive measures.  Will continue to monitor.

## 2021-01-31 NOTE — Evaluation (Signed)
Physical Therapy Evaluation Patient Details Name: Raymond Hays MRN: [id number] DOB: Apr 06, 1945 Today's Date: 01/31/2021  History of Present Illness  Raymond Hays is an 75 y.o. male comes to the ED with abdominal pain, distention, and nausea and vomiting. Also shortness of breath. He states that his problems began on 01/28/2021. His last BM was 4-5 days ago. He has been unable to keep anything down and feels that he very short of breath. He has a past medical history significant for Anxiety, COPD, chronic constipation, iron deficiency anemia, DM II, Parkinsons Disease, PAF, HTN, Spinal stenosis, and chronic right lower extremity edema.   Clinical Impression  Patient received in bed, he is agreeable to PT assessment. Patient has limited use/rom of B UEs due to prior shoulder injuries. Reports B LE numbness and has R LE swelling. Patient requires max +2 assist for supine>< sit. Poor sitting balance with posterior lean and requires assistance to maintain sitting balance. Increased effort to maintain balance. Patient will continue to benefit from skilled PT while here to improve bed mobility and possibly transfers. He is motivated to improve and be more functional.        Recommendations for follow up therapy are one component of a multi-disciplinary discharge planning process, led by the attending physician.  Recommendations may be updated based on patient status, additional functional criteria and insurance authorization.  Follow Up Recommendations Other (comment) (PT at long term care facility)    Assistance Recommended at Discharge Intermittent Supervision/Assistance  Functional Status Assessment Patient has had a recent decline in their functional status and demonstrates the ability to make significant improvements in function in a reasonable and predictable amount of time.  Equipment Recommendations  None recommended by PT    Recommendations for Other Services       Precautions / Restrictions  Precautions Precautions: Fall Restrictions Weight Bearing Restrictions: No      Mobility  Bed Mobility Overal bed mobility: Needs Assistance Bed Mobility: Supine to Sit;Sit to Supine     Supine to sit: Max assist;+2 for physical assistance Sit to supine: Max assist;+2 for physical assistance   General bed mobility comments: Use of bed features and draw sheet in order to mobilize to EOB. MaxA for LE's to return to bed and scoot up with bed in trendelenburg.    Transfers                   General transfer comment: not attempted    Ambulation/Gait             General Gait Details: w/c mobility at baseline  Stairs            Wheelchair Mobility    Modified Rankin (Stroke Patients Only)       Balance Overall balance assessment: Needs assistance Sitting-balance support: Feet supported Sitting balance-Leahy Scale: Poor Sitting balance - Comments: Patient requires posterior assistance to maintain sitting balance. Postural control: Posterior lean                                   Pertinent Vitals/Pain Pain Assessment: Faces Faces Pain Scale: Hurts little more Pain Location: general discomfort Pain Descriptors / Indicators: Discomfort;Grimacing;Guarding Pain Intervention(s): Monitored during session    Home Living Family/patient expects to be discharged to:: Skilled nursing facility                   Additional Comments: Pt resides at [facility], he is hoyered to wheelchair when oob    Prior Function Prior Level of Function : Needs assist  Cognitive Assist : Mobility (cognitive) Mobility (Cognitive): Intermittent cues   Physical Assist : Mobility (physical) Mobility (physical): Bed mobility   Mobility Comments: requires max +2 for bed mobility, reports numbness in B LEs, and has B mobility shoulder limitations       Hand Dominance   Dominant Hand: Right    Extremity/Trunk Assessment   Upper Extremity  Assessment Upper Extremity Assessment: Generalized weakness;RUE deficits/detail;LUE deficits/detail RUE Deficits / Details: poor rom due to prior injuries RUE: Shoulder pain with ROM RUE Coordination: decreased gross motor LUE Deficits / Details: poor rom due to prior injuries LUE: Shoulder pain with ROM LUE Coordination: decreased gross motor    Lower Extremity Assessment Lower Extremity Assessment: LLE deficits/detail;RLE deficits/detail RLE Deficits / Details: patient reports B foot and leg numbness RLE Sensation: decreased light touch RLE Coordination: decreased gross motor LLE Sensation: decreased light touch LLE Coordination: decreased gross motor       Communication   Communication: No difficulties  Cognition Arousal/Alertness: Awake/alert Behavior During Therapy: WFL for tasks assessed/performed Overall Cognitive Status: Within Functional Limits for tasks assessed                                          General Comments      Exercises     Assessment/Plan    PT Assessment Patient needs continued PT services  PT Problem List Decreased mobility;Decreased strength;Decreased balance;Decreased range of motion;Decreased activity tolerance       PT Treatment Interventions Functional mobility training;Therapeutic exercise;Therapeutic activities;Patient/family education    PT Goals (Current goals can be found in the Care Plan section)  Acute Rehab PT Goals Patient Stated Goal: improved independence with bed mobility/transfers PT Goal Formulation: With patient Time For Goal Achievement: 02/14/21 Potential to Achieve Goals: Fair    Frequency Min 2X/week   Barriers to discharge        Co-evaluation               AM-PAC PT "6 Clicks" Mobility  Outcome Measure Help needed turning from your back to your side while in a flat bed without using bedrails?: A Lot Help needed moving from lying on your back to sitting on the side of a flat bed  without using bedrails?: A Lot Help needed moving to and from a bed to a chair (including a wheelchair)?: Total Help needed standing up from a chair using your arms (e.g., wheelchair or bedside chair)?: Total Help needed to walk in hospital room?: Total Help needed climbing 3-5 steps with a railing? : Total 6 Click Score: 8    End of Session   Activity Tolerance: Patient limited by fatigue Patient left: in bed;with call bell/phone within reach;with bed alarm set Nurse Communication: Mobility status PT Visit Diagnosis: Muscle weakness (generalized) (M62.81);Other abnormalities of gait and mobility (R26.89);Pain Pain - part of body:  (general)    Time: 6270-3500 PT Time Calculation (min) (ACUTE ONLY): 18 min   Charges:   PT Evaluation $PT Eval Moderate Complexity: 1 Mod          Graycee Greeson, PT, GCS 01/31/21,2:33 PM

## 2021-01-31 NOTE — Progress Notes (Signed)
Dr. Lowell Guitar notified that the patient's abdomen is more distended and taut then earlier today. KUB ordered and patient made NPO.

## 2021-02-01 DIAGNOSIS — K5900 Constipation, unspecified: Secondary | ICD-10-CM | POA: Diagnosis not present

## 2021-02-01 LAB — COMPREHENSIVE METABOLIC PANEL
ALT: 5 U/L (ref 0–44)
AST: 10 U/L — ABNORMAL LOW (ref 15–41)
Albumin: 3 g/dL — ABNORMAL LOW (ref 3.5–5.0)
Alkaline Phosphatase: 53 U/L (ref 38–126)
Anion gap: 7 (ref 5–15)
BUN: 23 mg/dL (ref 8–23)
CO2: 27 mmol/L (ref 22–32)
Calcium: 8.5 mg/dL — ABNORMAL LOW (ref 8.9–10.3)
Chloride: 104 mmol/L (ref 98–111)
Creatinine, Ser: 0.82 mg/dL (ref 0.61–1.24)
GFR, Estimated: >60 mL/min (ref >60)
Glucose, Bld: 117 mg/dL — ABNORMAL HIGH (ref 70–99)
Potassium: 3.8 mmol/L (ref 3.5–5.1)
Sodium: 138 mmol/L (ref 135–145)
Total Bilirubin: 1.2 mg/dL (ref 0.3–1.2)
Total Protein: 5.3 g/dL — ABNORMAL LOW (ref 6.5–8.1)

## 2021-02-01 LAB — GLUCOSE, CAPILLARY
Glucose-Capillary: 113 mg/dL — ABNORMAL HIGH (ref 70–99)
Glucose-Capillary: 116 mg/dL — ABNORMAL HIGH (ref 70–99)
Glucose-Capillary: 122 mg/dL — ABNORMAL HIGH (ref 70–99)
Glucose-Capillary: 120 mg/dL — ABNORMAL HIGH (ref 70–99)
Glucose-Capillary: 158 mg/dL — ABNORMAL HIGH (ref 70–99)
Glucose-Capillary: 155 mg/dL — ABNORMAL HIGH (ref 70–99)

## 2021-02-01 LAB — CBC WITH DIFFERENTIAL/PLATELET
Abs Immature Granulocytes: 0.1 10*3/uL — ABNORMAL HIGH (ref 0.00–0.07)
Basophils Absolute: 0 10*3/uL (ref 0.0–0.1)
Basophils Relative: 0 %
Eosinophils Absolute: 0.1 10*3/uL (ref 0.0–0.5)
Eosinophils Relative: 2 %
HCT: 40.1 % (ref 39.0–52.0)
Hemoglobin: 13.2 g/dL (ref 13.0–17.0)
Immature Granulocytes: 1 %
Lymphocytes Relative: 13 %
Lymphs Abs: 1 10*3/uL (ref 0.7–4.0)
MCH: 31.6 pg (ref 26.0–34.0)
MCHC: 32.9 g/dL (ref 30.0–36.0)
MCV: 95.9 fL (ref 80.0–100.0)
Monocytes Absolute: 0.4 10*3/uL (ref 0.1–1.0)
Monocytes Relative: 5 %
Neutro Abs: 6.2 10*3/uL (ref 1.7–7.7)
Neutrophils Relative %: 79 %
Platelets: 162 10*3/uL (ref 150–400)
RBC: 4.18 MIL/uL — ABNORMAL LOW (ref 4.22–5.81)
RDW: 18.3 % — ABNORMAL HIGH (ref 11.5–15.5)
WBC: 7.8 10*3/uL (ref 4.0–10.5)
nRBC: 0 % (ref 0.0–0.2)

## 2021-02-01 LAB — MAGNESIUM: Magnesium: 2.2 mg/dL (ref 1.7–2.4)

## 2021-02-01 LAB — PHOSPHORUS: Phosphorus: 2.8 mg/dL (ref 2.5–4.6)

## 2021-02-01 NOTE — Progress Notes (Signed)
SLP Cancellation Note  Patient Details Name: Raymond Hays MRN: [id number] DOB: 11/26/1945   Cancelled treatment:       Reason Eval/Treat Not Completed: Patient not medically ready;Medical issues which prohibited therapy (chart reviewed; consulted NSG). Per MD note(Surgery), pt has Obstipation, Stool impaction. "Increased distention again today with some increased pain reported.  Simethicone around the clock for gaseous distention.  NPO until distention improves.". NSG endorsed NPO status w/ SLP.  ST services will f/u w/ pt's status tomorrow for BSE if appropriate then. Recommend frequent oral care for hygiene and stimulation of swallowing; possible Single ice chips w/ NSG per MD ok.      Jerilynn Som, MS, CCC-SLP Speech Language Pathologist Rehab Services [phone] [facility] 02/01/2021, 9:45 AM

## 2021-02-01 NOTE — Progress Notes (Signed)
Subjective:  CC: Raymond Hays is a 75 y.o. male  Hospital stay day 1,   stool impaction.  HPI: No acute issues. States he feel better and less distended, reported more Bms today.  ROS:  General: Denies weight loss, weight gain, fatigue, fevers, chills, and night sweats. Heart: Denies chest pain, palpitations, racing heart, irregular heartbeat, leg pain or swelling, and decreased activity tolerance. Respiratory: Denies breathing difficulty, shortness of breath, wheezing, cough, and sputum. GI: Denies change in appetite, heartburn, nausea, vomiting, constipation, diarrhea, and blood in stool. GU: Denies difficulty urinating, pain with urinating, urgency, frequency, blood in urine.   Objective:   Temp:  [97.5 F (36.4 C)-98.3 F (36.8 C)] 97.8 F (36.6 C) (11/03 0757) Pulse Rate:  [96-107] 106 (11/03 0757) Resp:  [16-18] 18 (11/03 0757) BP: (127-148)/(83-97) 137/83 (11/03 0757) SpO2:  [95 %-100 %] 98 % (11/03 0757)     Height: 6' (182.9 cm) Weight: 104.5 kg BMI (Calculated): 31.24   Intake/Output this shift:   Intake/Output Summary (Last 24 hours) at 02/01/2021 1302 Last data filed at 02/01/2021 0700 Gross per 24 hour  Intake 2230.73 ml  Output 700 ml  Net 1530.73 ml    Constitutional :  alert, cooperative, appears stated age, and no distress  Respiratory:  clear to auscultation bilaterally  Cardiovascular:  regular rate and rhythm  Gastrointestinal: Soft, no guarding, continued distention but less compared to previous exam .   Skin: Cool and moist.   Psychiatric: Normal affect, non-agitated, not confused       LABS:  CMP Latest Ref Rng & Units 02/01/2021 01/31/2021 01/30/2021  Glucose 70 - 99 mg/dL 253(G) 644(I) -  BUN 8 - 23 mg/dL 23 34(V) -  Creatinine 0.61 - 1.24 mg/dL 4.25 9.56 3.87  Sodium 135 - 145 mmol/L 138 138 -  Potassium 3.5 - 5.1 mmol/L 3.8 3.4(L) -  Chloride 98 - 111 mmol/L 104 103 -  CO2 22 - 32 mmol/L 27 27 -  Calcium 8.9 - 10.3 mg/dL 5.6(E) 3.3(I) -  Total  Protein 6.5 - 8.1 g/dL 5.3(L) 5.5(L) -  Total Bilirubin 0.3 - 1.2 mg/dL 1.2 1.1 -  Alkaline Phos 38 - 126 U/L 53 56 -  AST 15 - 41 U/L 10(L) 11(L) -  ALT 0 - 44 U/L 5 18 -   CBC Latest Ref Rng & Units 02/01/2021 01/31/2021 01/30/2021  WBC 4.0 - 10.5 K/uL 7.8 9.2 12.3(H)  Hemoglobin 13.0 - 17.0 g/dL 95.1 88.4 16.6  Hematocrit 39.0 - 52.0 % 40.1 40.0 43.3  Platelets 150 - 400 K/uL 162 174 198    RADS: N/a Assessment:   Stool impaction.  Clinical exam better today again with decreased distention and less reported pain with reported BM.  Will resume clears and see how he tolerates.  Continue current bowel regimen for now.

## 2021-02-01 NOTE — Progress Notes (Addendum)
PROGRESS NOTE    Raymond Hays  UDJ:[id number] DOB: Sep 03, 1945 DOA: 01/30/2021 PCP: [facility]   Chief Complaint  Patient presents with   Abdominal Pain   Brief Narrative:  75 yo gentleman with hx atrial fibrillation, COPD, chronic constipation, iron def, parkinsons, HTN, and multiple other medical problems presenting with abdominal distension found to have large stool burden in rectum and throughout colon on imaging.  Assessment & Plan:   Principal Problem:   Obstipation Active Problems:   AF (paroxysmal atrial fibrillation) (HCC)   Chronic pain   COPD with acute exacerbation (HCC)   Essential hypertension   AKI (acute kidney injury) (HCC)   Type 2 diabetes mellitus with diabetic neuropathy, without long-term current use of insulin (HCC)   Parkinson disease (HCC)   Respiratory failure (HCC)   Anxiety   Iron deficiency anemia   Edema leg  Obstipation  Abdominal Distension CT with large stool burden in rectum and throughout colon Appreciate surgery recommendations - appreciate assistance Clears per surgery Will follow   Acute Hypoxic Respiratory Failure Now on RA, suspect degree of SOB related to abdominal distension CXR today without acute disease Continue flutter valve SLP eval - lots of transmitted upper airway sounds Resolved  Atrial Fibrillation Continue metoprolol, amiodarone and eliqus  AKI Resolved, follow with IVF  Anxiety Hydroxyzine  Chronic Pain Tramadol  Parkinson's Sinemet  COPD with exacerbation Transmitted upper airway sounds yesterday -> improved today Continue albuterol, trelegy, and duonebs Hold off of steroids for now Seems improved  T2DM Hold metformin Continue SSI  Iron Def Anemia Stop iron  Hypertension Continue metoprolol  Lower Extremity Edema of RLE Chronic, follow  DVT prophylaxis: eliquis Code Status: full  Family Communication: none at bedside - called sister, apparently at work and unable to answer  phone, will call back tomorrow Disposition:   Status is: Observation  The patient will require care spanning > 2 midnights and should be moved to inpatient because: continued abdominal distension and pain      Consultants:  General surgery  Procedures:  none  Antimicrobials:  Anti-infectives (From admission, onward)    None          Subjective: Sleepy Feels Raymond Hays little better today  Objective: Vitals:   01/31/21 1625 01/31/21 1946 02/01/21 0424 02/01/21 0757  BP:  (!) 148/97 (!) 144/91 137/83  Pulse:  96 (!) 107 (!) 106  Resp:  16 18 18   Temp:  98.3 F (36.8 C) (!) 97.5 F (36.4 C) 97.8 F (36.6 C)  TempSrc:   Oral Oral  SpO2: 96% 95% 100% 98%  Weight:      Height:        Intake/Output Summary (Last 24 hours) at 02/01/2021 1437 Last data filed at 02/01/2021 1415 Gross per 24 hour  Intake 2826 ml  Output 700 ml  Net 2126 ml   Filed Weights   01/29/21 1802  Weight: 104.5 kg    Examination:  General: No acute distress. Cardiovascular: RRR Lungs: unlabored Abdomen: still distended, less discomfort, not quite as tense as yesterday, but still very distended Neurological: Alert and oriented 3. Moves all extremities 4 with equal strength. Cranial nerves II through XII grossly intact. Extremities: chronic RLE edema    Data Reviewed: I have personally reviewed following labs and imaging studies  CBC: Recent Labs  Lab 01/29/21 1758 01/30/21 1450 01/31/21 0503 02/01/21 0439  WBC 15.8* 12.3* 9.2 7.8  NEUTROABS 13.1*  --   --  6.2  HGB 16.6 14.7  14.0 13.2  HCT 47.0 43.3 40.0 40.1  MCV 92.7 94.1 93.5 95.9  PLT 287 198 174 162    Basic Metabolic Panel: Recent Labs  Lab 01/29/21 1758 01/30/21 1450 01/31/21 0503 02/01/21 0439  NA 136  --  138 138  K 4.0  --  3.4* 3.8  CL 101  --  103 104  CO2 26  --  27 27  GLUCOSE 191*  --  121* 117*  BUN 47*  --  34* 23  CREATININE 1.45* 1.24 1.00 0.82  CALCIUM 9.2  --  8.5* 8.5*  MG  --   --  2.5* 2.2   PHOS  --   --   --  2.8    GFR: Estimated Creatinine Clearance: 94.3 mL/min (by C-G formula based on SCr of 0.82 mg/dL).  Liver Function Tests: Recent Labs  Lab 01/29/21 1758 01/31/21 0503 02/01/21 0439  AST 13* 11* 10*  ALT 9 18 5   ALKPHOS 73 56 53  BILITOT 1.4* 1.1 1.2  PROT 7.1 5.5* 5.3*  ALBUMIN 4.0 3.1* 3.0*    CBG: Recent Labs  Lab 01/31/21 2334 02/01/21 0407 02/01/21 0425 02/01/21 0754 02/01/21 1140  GLUCAP 154* 113* 116* 122* 120*     Recent Results (from the past 240 hour(s))  Resp Panel by RT-PCR (Flu Raymond Hays&B, Covid) Nasopharyngeal Swab     Status: None   Collection Time: 01/30/21 12:21 AM   Specimen: Nasopharyngeal Swab; Nasopharyngeal(NP) swabs in vial transport medium  Result Value Ref Range Status   SARS Coronavirus 2 by RT PCR NEGATIVE NEGATIVE Final    Comment: (NOTE) SARS-CoV-2 target nucleic acids are NOT DETECTED.  The SARS-CoV-2 RNA is generally detectable in upper respiratory specimens during the acute phase of infection. The lowest concentration of SARS-CoV-2 viral copies this assay can detect is 138 copies/mL. Raymond Hays negative result does not preclude SARS-Cov-2 infection and should not be used as the sole basis for treatment or other patient management decisions. Raymond Hays negative result may occur with  improper specimen Hays, submission of specimen other than nasopharyngeal swab, presence of viral mutation(s) within the areas targeted by this assay, and inadequate number of viral copies(<138 copies/mL). Raymond Hays negative result must be combined with clinical observations, patient history, and epidemiological information. The expected result is Negative.  Fact Sheet for Patients:  13/01/22  Fact Sheet for Healthcare Providers:  BloggerCourse.com  This test is no t yet approved or cleared by the [employer] FDA and  has been authorized for detection and/or diagnosis of SARS-CoV-2  by FDA under an Emergency Use Authorization (EUA). This EUA will remain  in effect (meaning this test can be used) for the duration of the COVID-19 declaration under Section 564(b)(1) of the Act, 21 U.S.C.section 360bbb-3(b)(1), unless the authorization is terminated  or revoked sooner.       Influenza Raymond Hays by PCR NEGATIVE NEGATIVE Final   Influenza B by PCR NEGATIVE NEGATIVE Final    Comment: (NOTE) The Xpert Xpress SARS-CoV-2/FLU/RSV plus assay is intended as an aid in the diagnosis of influenza from Nasopharyngeal swab specimens and should not be used as Kouper Spinella sole basis for treatment. Nasal washings and aspirates are unacceptable for Xpert Xpress SARS-CoV-2/FLU/RSV testing.  Fact Sheet for Patients: [place]  Fact Sheet for Healthcare Providers: BloggerCourse.com  This test is not yet approved or cleared by the [employer]ious[employer].[place] FDA and has been authorized for detection and/or diagnosis of SARS-CoV-2 by FDA under an Emergency Use Authorization (EUA). This EUA will remain in  effect (meaning this test can be used) for the duration of the COVID-19 declaration under Section 564(b)(1) of the Act, 21 U.S.C. section 360bbb-3(b)(1), unless the authorization is terminated or revoked.  Performed at [facility], [address] [place], [place] [address]          Radiology Studies: DG Abd 1 View  Result Date: 01/31/2021 CLINICAL DATA:  Abdominal distension EXAM: ABDOMEN - 1 VIEW COMPARISON:  CT abdomen and pelvis 01/30/2021 FINDINGS: There is diffuse gaseous distension of bowel in the abdomen, with rectal stool ball present, and no definable transition point. Bilateral hip arthroplasties. IMPRESSION: Diffuse gaseous distention of bowel in the abdomen, with large stool burden in the rectum. Electronically Signed   By: Caprice Renshaw M.D.   On: 01/31/2021 17:00   CT ABDOMEN PELVIS W CONTRAST  Result Date: 01/30/2021 CLINICAL  DATA:  Abdominal distension EXAM: CT ABDOMEN AND PELVIS WITH CONTRAST TECHNIQUE: Multidetector CT imaging of the abdomen and pelvis was performed using the standard protocol following bolus administration of intravenous contrast. CONTRAST:  OMNIPAQUE IOHEXOL 300 MG/ML  SOLN COMPARISON:  Earlier today FINDINGS: Lower chest: Bibasilar scarring or atelectasis. Heart is upper limits normal in size. Mitral valve and coronary artery calcifications again noted. Hepatobiliary: Layering stones within the gallbladder, stable. Stable small cyst in the posterior right hepatic lobe. No biliary ductal dilatation. Pancreas: No focal abnormality or ductal dilatation. Spleen: No focal abnormality.  Normal size. Adrenals/Urinary Tract: Bilateral cortical thinning. Small exophytic cyst off the midpole of left kidney, stable. No stones or hydronephrosis. Adrenal glands unremarkable. Suprapubic catheter in the bladder which is decompressed. Stomach/Bowel: Large stool burden throughout the colon, most pronounced in the rectum. Cannot exclude fecal impaction. No evidence of bowel obstruction. Vascular/Lymphatic: Aortic atherosclerosis. No evidence of aneurysm or adenopathy. Reproductive: No visible focal abnormality. Other: No free fluid or free air. Musculoskeletal: Bilateral hip replacements. Degenerative changes in the lumbar spine. No acute bony abnormality. IMPRESSION: Large stool burden in the rectum and throughout the colon. Cannot exclude fecal impaction. Cholelithiasis. Aortic atherosclerosis. Bibasilar scarring or atelectasis. No change since earlier today. Electronically Signed   By: Charlett Nose M.D.   On: 01/30/2021 20:32   DG Chest Port 1 View  Result Date: 01/31/2021 CLINICAL DATA:  Abdominal pain.  COPD. EXAM: PORTABLE CHEST 1 VIEW COMPARISON:  01/29/2021 and prior exams. FINDINGS: Cardiac silhouette is mildly enlarged. No mediastinal or hilar masses. Low lung volumes.  Allowing for this, lungs are clear. No  convincing pleural effusion or pneumothorax. Skeletal structures are grossly intact. IMPRESSION: No acute cardiopulmonary disease and no significant change from the most recent prior study. Electronically Signed   By: Amie Portland M.D.   On: 01/31/2021 10:40        Scheduled Meds:  amiodarone  400 mg Oral Daily   apixaban  5 mg Oral BID   bisacodyl  10 mg Rectal Once   bisacodyl  10 mg Rectal Daily   carbidopa-levodopa  1 tablet Oral TID AC   escitalopram  5 mg Oral Daily   finasteride  5 mg Oral Daily   fluticasone furoate-vilanterol  1 puff Inhalation Daily   And   umeclidinium bromide  1 puff Inhalation Daily   gabapentin  800 mg Oral BID   hydrocerin  1 application Topical BID   insulin aspart  0-20 Units Subcutaneous Q4H   ipratropium  2 spray Each Nare BID   lactase  3,000 Units Oral TID WC   mineral oil  1 enema Rectal BID   pantoprazole  40 mg Oral BID AC   psyllium  1 packet Oral Daily   senna  2 tablet Oral QHS   simethicone  80 mg Oral QID   traZODone  100 mg Oral QHS   triamcinolone cream  1 application Topical BID   Continuous Infusions:  dextrose 5% lactated ringers 50 mL/hr at 02/01/21 0957     LOS: 1 day    Time spent: over 30 min    Lacretia Nicks, MD [facility]   To contact the attending provider between 7A-7P or the covering provider during after hours 7P-7A, please log into the web site [email or web address] and access using universal [place] password for that web site. If you do not have the password, please call the hospital operator.  02/01/2021, 2:37 PM

## 2021-02-02 ENCOUNTER — Other Ambulatory Visit: Payer: Self-pay

## 2021-02-02 ENCOUNTER — Inpatient Hospital Stay: Payer: No Typology Code available for payment source

## 2021-02-02 DIAGNOSIS — K5900 Constipation, unspecified: Secondary | ICD-10-CM | POA: Diagnosis not present

## 2021-02-02 LAB — CBC WITH DIFFERENTIAL/PLATELET
Abs Immature Granulocytes: 0.06 10*3/uL (ref 0.00–0.07)
Basophils Absolute: 0 10*3/uL (ref 0.0–0.1)
Basophils Relative: 0 %
Eosinophils Absolute: 0.1 10*3/uL (ref 0.0–0.5)
Eosinophils Relative: 2 %
HCT: 36.4 % — ABNORMAL LOW (ref 39.0–52.0)
Hemoglobin: 11.8 g/dL — ABNORMAL LOW (ref 13.0–17.0)
Immature Granulocytes: 1 %
Lymphocytes Relative: 9 %
Lymphs Abs: 0.6 10*3/uL — ABNORMAL LOW (ref 0.7–4.0)
MCH: 31.6 pg (ref 26.0–34.0)
MCHC: 32.4 g/dL (ref 30.0–36.0)
MCV: 97.3 fL (ref 80.0–100.0)
Monocytes Absolute: 0.3 10*3/uL (ref 0.1–1.0)
Monocytes Relative: 4 %
Neutro Abs: 6.2 10*3/uL (ref 1.7–7.7)
Neutrophils Relative %: 84 %
Platelets: 143 10*3/uL — ABNORMAL LOW (ref 150–400)
RBC: 3.74 MIL/uL — ABNORMAL LOW (ref 4.22–5.81)
RDW: 18.5 % — ABNORMAL HIGH (ref 11.5–15.5)
WBC: 7.3 10*3/uL (ref 4.0–10.5)
nRBC: 0 % (ref 0.0–0.2)

## 2021-02-02 LAB — GLUCOSE, CAPILLARY
Glucose-Capillary: 107 mg/dL — ABNORMAL HIGH (ref 70–99)
Glucose-Capillary: 113 mg/dL — ABNORMAL HIGH (ref 70–99)
Glucose-Capillary: 114 mg/dL — ABNORMAL HIGH (ref 70–99)
Glucose-Capillary: 114 mg/dL — ABNORMAL HIGH (ref 70–99)
Glucose-Capillary: 116 mg/dL — ABNORMAL HIGH (ref 70–99)
Glucose-Capillary: 157 mg/dL — ABNORMAL HIGH (ref 70–99)
Glucose-Capillary: 157 mg/dL — ABNORMAL HIGH (ref 70–99)
Glucose-Capillary: 171 mg/dL — ABNORMAL HIGH (ref 70–99)

## 2021-02-02 LAB — COMPREHENSIVE METABOLIC PANEL
ALT: 13 U/L (ref 0–44)
AST: 10 U/L — ABNORMAL LOW (ref 15–41)
Albumin: 2.5 g/dL — ABNORMAL LOW (ref 3.5–5.0)
Alkaline Phosphatase: 47 U/L (ref 38–126)
Anion gap: 7 (ref 5–15)
BUN: 15 mg/dL (ref 8–23)
CO2: 27 mmol/L (ref 22–32)
Calcium: 8.3 mg/dL — ABNORMAL LOW (ref 8.9–10.3)
Chloride: 108 mmol/L (ref 98–111)
Creatinine, Ser: 0.88 mg/dL (ref 0.61–1.24)
GFR, Estimated: >60 mL/min (ref >60)
Glucose, Bld: 111 mg/dL — ABNORMAL HIGH (ref 70–99)
Potassium: 3.5 mmol/L (ref 3.5–5.1)
Sodium: 142 mmol/L (ref 135–145)
Total Bilirubin: 1.1 mg/dL (ref 0.3–1.2)
Total Protein: 4.7 g/dL — ABNORMAL LOW (ref 6.5–8.1)

## 2021-02-02 LAB — BRAIN NATRIURETIC PEPTIDE: B Natriuretic Peptide: 56.3 pg/mL (ref 0.0–100.0)

## 2021-02-02 LAB — PHOSPHORUS: Phosphorus: 2.3 mg/dL — ABNORMAL LOW (ref 2.5–4.6)

## 2021-02-02 LAB — MAGNESIUM: Magnesium: 2 mg/dL (ref 1.7–2.4)

## 2021-02-02 MED ORDER — METOPROLOL TARTRATE 5 MG/5ML IV SOLN
5.0000 mg | INTRAVENOUS | Status: DC | PRN
  Administered 2021-02-02: 5 mg via INTRAVENOUS
  Filled 2021-02-02: qty 5

## 2021-02-02 MED ORDER — METOPROLOL TARTRATE 50 MG PO TABS
50.0000 mg | ORAL_TABLET | Freq: Two times a day (BID) | ORAL | Status: DC
  Administered 2021-02-02: 50 mg via ORAL
  Filled 2021-02-02 (×2): qty 1

## 2021-02-02 NOTE — TOC Progression Note (Signed)
Transition of Care [facility]) - Progression Note    Patient Details  Name: Raymond Hays MRN: [id number] Date of Birth: 1946/03/15  [facility] CM/SW Contact  Chapman Fitch, RN Phone Number: 02/02/2021, 9:17 AM  Clinical Narrative:     [facility]ified [facility] that patient has changed to inpatient   Expected Discharge Plan: Skilled Nursing Facility Barriers to Discharge: Continued Medical Work up  Expected Discharge Plan and Services Expected Discharge Plan: Skilled Nursing Facility                                               Social Determinants of Health (SDOH) Interventions    Readmission Risk Interventions Readmission Risk Prevention Plan 01/12/2021 10/20/2020  Transportation Screening Complete Complete  PCP or Specialist Appt within 3-5 Days - Complete  HRI or Home Care Consult - Complete  Palliative Care Screening - Not Applicable  Medication Review (RN Care Manager) Complete Referral to Pharmacy  PCP or Specialist appointment within 3-5 days of discharge Complete -  HRI or Home Care Consult Complete -  SW Recovery Care/Counseling Consult Complete -  Palliative Care Screening Not Applicable -  Skilled Nursing Facility Complete -

## 2021-02-02 NOTE — Progress Notes (Addendum)
PROGRESS NOTE    Raymond Hays  MIW:[id number] DOB: November 26, 1945 DOA: 01/30/2021 PCP: [facility]   Chief Complaint  Patient presents with   Abdominal Pain   Brief Narrative:  75 yo gentleman with hx atrial fibrillation, COPD, chronic constipation, iron def, parkinsons, HTN, and multiple other medical problems presenting with abdominal distension found to have large stool burden in rectum and throughout colon on imaging.  Assessment & Plan:   Principal Problem:   Obstipation Active Problems:   AF (paroxysmal atrial fibrillation) (HCC)   Chronic pain   COPD with acute exacerbation (HCC)   Essential hypertension   AKI (acute kidney injury) (HCC)   Type 2 diabetes mellitus with diabetic neuropathy, without long-term current use of insulin (HCC)   Parkinson disease (HCC)   Respiratory failure (HCC)   Anxiety   Iron deficiency anemia   Edema leg  Obstipation  Abdominal Distension CT with large stool burden in rectum and throughout colon Appreciate surgery recommendations - appreciate assistance - recommending daily enema outpatient to facilitate BM, fiber, stool softner, as well as prn laxatives Regular diet per surgery Will follow - likely d/c tomorrow  Acute Hypoxic Respiratory Failure Now on RA, suspect degree of SOB related to abdominal distension CXR today without acute disease Continue flutter valve SLP eval - lots of transmitted upper airway sounds Resolved  Atrial Fibrillation Continue metoprolol, amiodarone and eliqus RVR noted, metoprolol not ordered - IV metop, then resume oral metop (start at 50 and uptitrate)  AKI Resolved, follow with IVF  Anxiety Hydroxyzine  Chronic Pain Tramadol  Parkinson's Sinemet  COPD with exacerbation Transmitted upper airway sounds yesterday -> improved today Continue albuterol, trelegy, and duonebs Hold off of steroids for now Seems improved  T2DM Hold metformin Continue SSI  Iron Def Anemia Stop  iron  Hypertension Continue metoprolol  Lower Extremity Edema of RLE Chronic, follow Developing Left lower extremity edema, follow BNP and LE US  DVT prophylaxis: eliquis Code Status: full  Family Communication: discussed with sister today Disposition:   Status is: Observation  The patient will require care spanning > 2 midnights and should be moved to inpatient because: continued abdominal distension and pain      Consultants:  General surgery  Procedures:  none  Antimicrobials:  Anti-infectives (From admission, onward)    None          Subjective: Feels better today  Objective: Vitals:   02/02/21 0746 02/02/21 0830 02/02/21 1205 02/02/21 1550  BP: (!) 84/58 105/71 (!) 148/79 112/73  Pulse: (!) 120 (!) 119 (!) 128 (!) 128  Resp: 18 18 18 18   Temp: 98.6 F (37 C) 98.9 F (37.2 C) 98.9 F (37.2 C) 98.6 F (37 C)  TempSrc:      SpO2: 95% 96% 97% 98%  Weight:      Height:        Intake/Output Summary (Last 24 hours) at 02/02/2021 1710 Last data filed at 02/02/2021 1350 Gross per 24 hour  Intake 1421.87 ml  Output 600 ml  Net 821.87 ml   Filed Weights   01/29/21 1802  Weight: 104.5 kg    Examination:  General: No acute distress. Cardiovascular: RRR Lungs: unlabroed Abdomen: distended, nontender Neurological: Alert and oriented 3. Moves all extremities 4. Cranial nerves II through XII grossly intact. Skin: Warm and dry. No rashes or lesions. Extremities: chronic RLE edema, developing LLE edema     Data Reviewed: I have personally reviewed following labs and imaging studies  CBC: Recent  Labs  Lab 01/29/21 1758 01/30/21 1450 01/31/21 0503 02/01/21 0439 02/02/21 0434  WBC 15.8* 12.3* 9.2 7.8 7.3  NEUTROABS 13.1*  --   --  6.2 6.2  HGB 16.6 14.7 14.0 13.2 11.8*  HCT 47.0 43.3 40.0 40.1 36.4*  MCV 92.7 94.1 93.5 95.9 97.3  PLT 287 198 174 162 143*    Basic Metabolic Panel: Recent Labs  Lab 01/29/21 1758 01/30/21 1450  01/31/21 0503 02/01/21 0439 02/02/21 0434  NA 136  --  138 138 142  K 4.0  --  3.4* 3.8 3.5  CL 101  --  103 104 108  CO2 26  --  27 27 27   GLUCOSE 191*  --  121* 117* 111*  BUN 47*  --  34* 23 15  CREATININE 1.45* 1.24 1.00 0.82 0.88  CALCIUM 9.2  --  8.5* 8.5* 8.3*  MG  --   --  2.5* 2.2 2.0  PHOS  --   --   --  2.8 2.3*    GFR: Estimated Creatinine Clearance: 87.9 mL/min (by C-G formula based on SCr of 0.88 mg/dL).  Liver Function Tests: Recent Labs  Lab 01/29/21 1758 01/31/21 0503 02/01/21 0439 02/02/21 0434  AST 13* 11* 10* 10*  ALT 9 18 5 13   ALKPHOS 73 56 53 47  BILITOT 1.4* 1.1 1.2 1.1  PROT 7.1 5.5* 5.3* 4.7*  ALBUMIN 4.0 3.1* 3.0* 2.5*    CBG: Recent Labs  Lab 02/02/21 0000 02/02/21 0411 02/02/21 0742 02/02/21 1157 02/02/21 1545  GLUCAP 114* 113* 116* 157* 107*     Recent Results (from the past 240 hour(s))  Resp Panel by RT-PCR (Flu Deveron Shamoon&B, Covid) Nasopharyngeal Swab     Status: None   Collection Time: 01/30/21 12:21 AM   Specimen: Nasopharyngeal Swab; Nasopharyngeal(NP) swabs in vial transport medium  Result Value Ref Range Status   SARS Coronavirus 2 by RT PCR NEGATIVE NEGATIVE Final    Comment: (NOTE) SARS-CoV-2 target nucleic acids are NOT DETECTED.  The SARS-CoV-2 RNA is generally detectable in upper respiratory specimens during the acute phase of infection. The lowest concentration of SARS-CoV-2 viral copies this assay can detect is 138 copies/mL. Carmelita Amparo negative result does not preclude SARS-Cov-2 infection and should not be used as the sole basis for treatment or other patient management decisions. Geoffry Bannister negative result may occur with  improper specimen collection/handling, submission of specimen other than nasopharyngeal swab, presence of viral mutation(s) within the areas targeted by this assay, and inadequate number of viral copies(<138 copies/mL). Harlee Eckroth negative result must be combined with clinical observations, patient history, and  epidemiological information. The expected result is Negative.  Fact Sheet for Patients:  13/04/22  Fact Sheet for Healthcare Providers:  13/01/22  This test is no t yet approved or cleared by the [employer] FDA and  has been authorized for detection and/or diagnosis of SARS-CoV-2 by FDA under an Emergency Use Authorization (EUA). This EUA will remain  in effect (meaning this test can be used) for the duration of the COVID-19 declaration under Section 564(b)(1) of the Act, 21 U.S.C.section 360bbb-3(b)(1), unless the authorization is terminated  or revoked sooner.       Influenza Jenissa Tyrell by PCR NEGATIVE NEGATIVE Final   Influenza B by PCR NEGATIVE NEGATIVE Final    Comment: (NOTE) The Xpert Xpress SARS-CoV-2/FLU/RSV plus assay is intended as an aid in the diagnosis of influenza from Nasopharyngeal swab specimens and should not be used as Kaniya Trueheart sole basis for treatment. Nasal washings  and aspirates are unacceptable for Xpert Xpress SARS-CoV-2/FLU/RSV testing.  Fact Sheet for Patients: [employer]  Fact Sheet for Healthcare Providers: SeriousBroker.it  This test is not yet approved or cleared by the [place] FDA and has been authorized for detection and/or diagnosis of SARS-CoV-2 by FDA under an Emergency Use Authorization (EUA). This EUA will remain in effect (meaning this test can be used) for the duration of the COVID-19 declaration under Section 564(b)(1) of the Act, 21 U.S.C. section 360bbb-3(b)(1), unless the authorization is terminated or revoked.  Performed at [facility], [address], [place], [place] [address]          Radiology Studies: No results found.      Scheduled Meds:  amiodarone  400 mg Oral Daily   apixaban  5 mg Oral BID   bisacodyl  10 mg Rectal Once   bisacodyl  10 mg Rectal Daily   carbidopa-levodopa  1  tablet Oral TID AC   escitalopram  5 mg Oral Daily   finasteride  5 mg Oral Daily   fluticasone furoate-vilanterol  1 puff Inhalation Daily   And   umeclidinium bromide  1 puff Inhalation Daily   gabapentin  800 mg Oral BID   hydrocerin  1 application Topical BID   insulin aspart  0-20 Units Subcutaneous Q4H   ipratropium  2 spray Each Nare BID   lactase  3,000 Units Oral TID WC   mineral oil  1 enema Rectal BID   pantoprazole  40 mg Oral BID AC   psyllium  1 packet Oral Daily   senna  2 tablet Oral QHS   simethicone  80 mg Oral QID   traZODone  100 mg Oral QHS   triamcinolone cream  1 application Topical BID   Continuous Infusions:     LOS: 2 days    Time spent: over 30 min    Lacretia Nicks, MD [facility]   To contact the attending provider between 7A-7P or the covering provider during after hours 7P-7A, please log into the web site [email or web address] and access using universal  password for that web site. If you do not have the password, please call the hospital operator.  02/02/2021, 5:10 PM

## 2021-02-02 NOTE — Progress Notes (Signed)
Patient did not receive enema nor suppository today as each time nurse went in and tried to administer patient asked her to come back and had reason as to why he was eating, he needed his food to digest, he wanted to do later. Patient educated on need to have and he grunted with disapproval.

## 2021-02-02 NOTE — TOC Progression Note (Signed)
Transition of Care [facility]) - Progression Note    Patient Details  Name: Raymond Hays MRN: [id number] Date of Birth: Apr 04, 1945  [facility] CM/SW Contact  Chapman Fitch, RN Phone Number: 02/02/2021, 1:34 PM  Clinical Narrative:     Per MD anticipated DC tomorrow.  Message sent to Stanton Kidney at [facility] to determine if patient can return over the weekend  Expected Discharge Plan: Skilled Nursing Facility Barriers to Discharge: Continued Medical Work up  Expected Discharge Plan and Services Expected Discharge Plan: Skilled Nursing Facility                                               Social Determinants of Health (SDOH) Interventions    Readmission Risk Interventions Readmission Risk Prevention Plan 01/12/2021 10/20/2020  Transportation Screening Complete Complete  PCP or Specialist Appt within 3-5 Days - Complete  HRI or Home Care Consult - Complete  Palliative Care Screening - Not Applicable  Medication Review (RN Care Manager) Complete Referral to Pharmacy  PCP or Specialist appointment within 3-5 days of discharge Complete -  HRI or Home Care Consult Complete -  SW Recovery Care/Counseling Consult Complete -  Palliative Care Screening Not Applicable -  Skilled Nursing Facility Complete -

## 2021-02-02 NOTE — Progress Notes (Signed)
Patients sister called with concern saying she keeps missing Dr. Roanna Banning call. She gave two numbers that she can be reached at. Dr. Lowell Guitar notified and given the numbers via secure chat. Sister states she will be home all day so that she does not miss the doctors call.

## 2021-02-02 NOTE — Plan of Care (Signed)

## 2021-02-02 NOTE — Progress Notes (Addendum)
SLP Cancellation Note  Patient Details Name: Raymond Hays MRN: [id number] DOB: 1946-01-17   Cancelled treatment:       Reason Eval/Treat Not Completed: SLP screened, no needs identified, will sign off (chart reviewed; consulted NSG/MD/pt). Pt has been upgraded in his oral diet per Surgery MD s/p Obstipation issues; he has tolerated Clear Liquids, now a Regular (carb mod) diet. Both pt and NSG report no swallowing issues w/ the oral diets. Pt is swallowing Pills w/ NSG w/out issues also. CXR: No acute cardiopulmonary disease.  Consulted MD re: above who agreed w/ ST services signing off at this time. MD to reconsult if any new needs arise while admitted. Recommend general aspiration and Reflux precautions w/ oral diet; oral care.      Jerilynn Som, MS, CCC-SLP Speech Language Pathologist Rehab Services [phone] [facility] 02/02/2021, 4:30 PM

## 2021-02-02 NOTE — Progress Notes (Signed)
   02/02/21 0830  Assess: MEWS Score  Temp 98.9 F (37.2 C)  BP 105/71  Pulse Rate (!) 119  Resp 18  SpO2 96 %  O2 Device Room Air  Assess: MEWS Score  MEWS Temp 0  MEWS Systolic 0  MEWS Pulse 2  MEWS RR 0  MEWS LOC 0  MEWS Score 2  MEWS Score Color Yellow  Assess: if the MEWS score is Yellow or Red  Were vital signs taken at a resting state? Yes  Focused Assessment No change from prior assessment  Does the patient meet 2 or more of the SIRS criteria? No  Does the patient have a confirmed or suspected source of infection? No  MEWS guidelines implemented *See Row Information* No, previously yellow, continue vital signs every 4 hours  Treat  MEWS Interventions Other (Comment) (continue to monitor pt)  Assess: SIRS CRITERIA  SIRS Temperature  0  SIRS Pulse 1  SIRS Respirations  0  SIRS WBC 0  SIRS Score Sum  1  Patient continues to be stable despite abnormal HR, HR has been in the 100's previously looking at vital trends. Patient denies any feeling of malaise. Will continue to monitor.

## 2021-02-02 NOTE — Progress Notes (Signed)
Subjective:  CC: Raymond Hays is a 75 y.o. male  Hospital stay day 2,   stool impaction.  HPI: No acute issues. More BM today, tolerating clears. ROS:  General: Denies weight loss, weight gain, fatigue, fevers, chills, and night sweats. Heart: Denies chest pain, palpitations, racing heart, irregular heartbeat, leg pain or swelling, and decreased activity tolerance. Respiratory: Denies breathing difficulty, shortness of breath, wheezing, cough, and sputum. GI: Denies change in appetite, heartburn, nausea, vomiting, constipation, diarrhea, and blood in stool. GU: Denies difficulty urinating, pain with urinating, urgency, frequency, blood in urine.   Objective:   Temp:  [98.2 F (36.8 C)-98.9 F (37.2 C)] 98.9 F (37.2 C) (11/04 1205) Pulse Rate:  [99-128] 128 (11/04 1205) Resp:  [16-18] 18 (11/04 1205) BP: (84-148)/(58-79) 148/79 (11/04 1205) SpO2:  [95 %-98 %] 97 % (11/04 1205)     Height: 6' (182.9 cm) Weight: 104.5 kg BMI (Calculated): 31.24   Intake/Output this shift:   Intake/Output Summary (Last 24 hours) at 02/02/2021 1519 Last data filed at 02/02/2021 1350 Gross per 24 hour  Intake 1985.94 ml  Output 1200 ml  Net 785.94 ml    Constitutional :  alert, cooperative, appears stated age, and no distress  Respiratory:  clear to auscultation bilaterally  Cardiovascular:  regular rate and rhythm  Gastrointestinal: Soft, on guarding, continued improvement in distention  Skin: Cool and moist.   Psychiatric: Normal affect, non-agitated, not confused       LABS:  CMP Latest Ref Rng & Units 02/02/2021 02/01/2021 01/31/2021  Glucose 70 - 99 mg/dL 537(S) 827(M) 786(L)  BUN 8 - 23 mg/dL 15 23 54(G)  Creatinine 0.61 - 1.24 mg/dL 9.20 1.00 7.12  Sodium 135 - 145 mmol/L 142 138 138  Potassium 3.5 - 5.1 mmol/L 3.5 3.8 3.4(L)  Chloride 98 - 111 mmol/L 108 104 103  CO2 22 - 32 mmol/L 27 27 27   Calcium 8.9 - 10.3 mg/dL 8.3(L) 8.5(L) 8.5(L)  Total Protein 6.5 - 8.1 g/dL 4.7(L) 5.3(L)  5.5(L)  Total Bilirubin 0.3 - 1.2 mg/dL 1.1 1.2 1.1  Alkaline Phos 38 - 126 U/L 47 53 56  AST 15 - 41 U/L 10(L) 10(L) 11(L)  ALT 0 - 44 U/L 13 5 18    CBC Latest Ref Rng & Units 02/02/2021 02/01/2021 01/31/2021  WBC 4.0 - 10.5 K/uL 7.3 7.8 9.2  Hemoglobin 13.0 - 17.0 g/dL 11.8(L) 13.2 14.0  Hematocrit 39.0 - 52.0 % 36.4(L) 40.1 40.0  Platelets 150 - 400 K/uL 143(L) 162 174    RADS: N/a Assessment:   Stool impaction.  Resolving. Advance to diabetic diet.  Recommend continuing enemas on a daily basis as outpt to facilitate BM.  Fiber and stool softner as well.  Prn laxatives if starts to have issues with constipation again.  No surgical intervention needed.  Surgery to sign off.

## 2021-02-02 NOTE — Progress Notes (Signed)
   02/02/21 1205  Assess: MEWS Score  Temp 98.9 F (37.2 C)  BP (!) 148/79  Pulse Rate (!) 128  Resp 18  SpO2 97 %  O2 Device Room Air  Assess: MEWS Score  MEWS Temp 0  MEWS Systolic 0  MEWS Pulse 2  MEWS RR 0  MEWS LOC 0  MEWS Score 2  MEWS Score Color Yellow  Assess: if the MEWS score is Yellow or Red  Were vital signs taken at a resting state? Yes  Focused Assessment No change from prior assessment  Does the patient meet 2 or more of the SIRS criteria? No  Does the patient have a confirmed or suspected source of infection? No  MEWS guidelines implemented *See Row Information* No, previously yellow, continue vital signs every 4 hours  Treat  MEWS Interventions Other (Comment) (continue to monitor patient)  Pain Scale 0-10  Pain Score 0  Assess: SIRS CRITERIA  SIRS Temperature  0  SIRS Pulse 1  SIRS Respirations  0  SIRS WBC 0  SIRS Score Sum  1  Patient remains stable despite elevated BP and HR per nurse tech he began talking, joking and laughing with her when she was taking vital signs. Will continue to monitor patient for any signs of distress and/or change from baseline.

## 2021-02-02 NOTE — Progress Notes (Signed)
   02/02/21 0746  Assess: MEWS Score  Temp 98.6 F (37 C)  BP (!) 84/58  Pulse Rate (!) 120  Resp 18  SpO2 95 %  O2 Device Room Air  Assess: MEWS Score  MEWS Temp 0  MEWS Systolic 1  MEWS Pulse 2  MEWS RR 0  MEWS LOC 0  MEWS Score 3  MEWS Score Color Yellow  Assess: if the MEWS score is Yellow or Red  Were vital signs taken at a resting state? Yes  Focused Assessment No change from prior assessment  Does the patient meet 2 or more of the SIRS criteria? Yes  Does the patient have a confirmed or suspected source of infection? No  MEWS guidelines implemented *See Row Information* No, vital signs rechecked  Treat  MEWS Interventions Other (Comment) (continue monitoring patient)  Pain Scale 0-10  Pain Score 0  Assess: SIRS CRITERIA  SIRS Temperature  0  SIRS Pulse 1  SIRS Respirations  0  SIRS WBC 0  SIRS Score Sum  1  Patient not noted to be in any distress he is alert and oriented and denies any feeling of malaise. Will continue to monitor pt.

## 2021-02-02 NOTE — NC FL2 (Signed)
[place] MEDICAID FL2 LEVEL OF CARE SCREENING TOOL     IDENTIFICATION  Patient Name: Raymond Hays Birthdate: [birth year] Sex: male Admission Date (Current Location): 01/30/2021  [facility] and [place] Number:  Chiropodist and Address:  [facility], [address], [place], [place] [address]      Provider Number: [id number]  Attending Physician Name and Address:  Zigmund Daniel., *  Relative Name and Phone Number:  Osborn Coho (Sister)   [phone] [facility])    Current Level of Care: Hospital Recommended Level of Care: Skilled Nursing Facility Prior Approval Number:    Date Approved/Denied:   PASRR Number: [id number]  Discharge Plan: SNF    Current Diagnoses: Patient Active Problem List   Diagnosis Date Noted   Obstipation 01/30/2021   Anxiety 01/30/2021   Iron deficiency anemia    Edema leg    Respiratory failure (HCC) 01/09/2021   Ileus (HCC)    Neck pain    AKI (acute kidney injury) (HCC)    Type 2 diabetes mellitus with diabetic neuropathy, without long-term current use of insulin (HCC)    Parkinson disease (HCC)    Sepsis (HCC) 10/16/2020   Chronic respiratory failure with hypoxia (HCC) 01/10/2020   Impaired functional mobility, balance, gait, and endurance 09/15/2019   Catheter cystitis (HCC) 10/16/2018   COVID-19 virus detected 07/03/2018   Benign prostatic hyperplasia with lower urinary tract symptoms 08/06/2017   Osteoarthritis of right knee 06/19/2017   S/P orthopedic surgery, follow-up exam 10/25/2016   Tendon rupture of wrist, sequela 01/02/2016   Acute pain of right knee 08/03/2015   Calcific tendinitis of left shoulder 07/05/2015   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/05/2015   Gait difficulty 06/26/2015   Severe mitral regurgitation 06/05/2015   AF (paroxysmal atrial fibrillation) (HCC) 05/31/2015   Chronic pain 05/31/2015   COPD with acute exacerbation (HCC) 05/31/2015   Essential hypertension  05/31/2015   Chronic pain of left wrist 04/24/2015   Arthritis of left wrist 04/24/2015   Rupture of extensor tendon of left hand 04/24/2015    Orientation RESPIRATION BLADDER Height & Weight     Time, Self, Situation, Place  Normal Incontinent (suprapubic cath) Weight: 104.5 kg Height:  6' (182.9 cm)  BEHAVIORAL SYMPTOMS/MOOD NEUROLOGICAL BOWEL NUTRITION STATUS      Incontinent Diet (Carb modified)  AMBULATORY STATUS COMMUNICATION OF NEEDS Skin   Extensive Assist Verbally Skin abrasions, Bruising                       Personal Care Assistance Level of Assistance              Functional Limitations Info             SPECIAL CARE FACTORS FREQUENCY                       Contractures Contractures Info: Not present    Additional Factors Info  Code Status, Allergies Code Status Info: Full Allergies Info: NKDA           Current Medications (02/02/2021):  This is the current hospital active medication list Current Facility-Administered Medications  Medication Dose Route Frequency Provider Last Rate Last Admin   acetaminophen (TYLENOL) tablet 650 mg  650 mg Oral Q6H PRN Swayze, Ava, DO       Or   acetaminophen (TYLENOL) suppository 650 mg  650 mg Rectal Q6H PRN Swayze, Ava, DO       albuterol (  PROVENTIL) (2.5 MG/3ML) 0.083% nebulizer solution 2.5 mg  2.5 mg Nebulization Q4H PRN Rauer, Robyne Peers, RPH   2.5 mg at 01/31/21 1622   amiodarone (PACERONE) tablet 400 mg  400 mg Oral Daily Swayze, Ava, DO   400 mg at 02/02/21 0934   apixaban (ELIQUIS) tablet 5 mg  5 mg Oral BID Swayze, Ava, DO   5 mg at 02/02/21 0934   bisacodyl (DULCOLAX) suppository 10 mg  10 mg Rectal Once Swayze, Ava, DO       bisacodyl (DULCOLAX) suppository 10 mg  10 mg Rectal Daily Sakai, Isami, DO   10 mg at 02/02/21 0934   camphor-menthol (SARNA) lotion   Topical PRN Swayze, Ava, DO       carbidopa-levodopa (SINEMET IR) 25-100 MG per tablet immediate release 1 tablet  1 tablet Oral TID AC  Swayze, Ava, DO   1 tablet at 02/02/21 0934   dextrose 5 % in lactated ringers infusion   Intravenous Continuous Swayze, Ava, DO 50 mL/hr at 02/02/21 0620 New Bag at 02/02/21 0620   escitalopram (LEXAPRO) tablet 5 mg  5 mg Oral Daily Swayze, Ava, DO   5 mg at 02/02/21 0933   finasteride (PROSCAR) tablet 5 mg  5 mg Oral Daily Swayze, Ava, DO   5 mg at 02/02/21 0934   fluticasone furoate-vilanterol (BREO ELLIPTA) 100-25 MCG/ACT 1 puff  1 puff Inhalation Daily Rauer, Robyne Peers, RPH   1 puff at 02/02/21 0936   And   umeclidinium bromide (INCRUSE ELLIPTA) 62.5 MCG/ACT 1 puff  1 puff Inhalation Daily Rauer, Robyne Peers, RPH   1 puff at 02/02/21 0936   gabapentin (NEURONTIN) capsule 800 mg  800 mg Oral BID Swayze, Ava, DO   800 mg at 02/02/21 0932   hydrocerin (EUCERIN) cream 1 application  1 application Topical BID Swayze, Ava, DO   1 application at 02/01/21 0958   hydrOXYzine (ATARAX/VISTARIL) tablet 25 mg  25 mg Oral BID PRN Swayze, Ava, DO       insulin aspart (novoLOG) injection 0-20 Units  0-20 Units Subcutaneous Q4H Swayze, Ava, DO   4 Units at 02/01/21 2129   ipratropium (ATROVENT) 0.03 % nasal spray 2 spray  2 spray Each Nare BID Swayze, Ava, DO   2 spray at 02/02/21 0937   lactase (LACTAID) tablet 3,000 Units  3,000 Units Oral TID WC Swayze, Ava, DO   3,000 Units at 02/02/21 0933   methocarbamol (ROBAXIN) tablet 1,000 mg  1,000 mg Oral Q8H PRN Swayze, Ava, DO       mineral oil enema 1 enema  1 enema Rectal BID Tonna Boehringer, Isami, DO   1 enema at 02/01/21 0801   nivea cream 1 application  1 application Topical PRN Swayze, Ava, DO       ondansetron (ZOFRAN) tablet 4 mg  4 mg Oral Q6H PRN Swayze, Ava, DO       Or   ondansetron (ZOFRAN) injection 4 mg  4 mg Intravenous Q6H PRN Swayze, Ava, DO       pantoprazole (PROTONIX) EC tablet 40 mg  40 mg Oral BID AC Swayze, Ava, DO   40 mg at 02/02/21 0934   psyllium (HYDROCIL/METAMUCIL) 1 packet  1 packet Oral Daily Swayze, Ava, DO   1 packet at 02/02/21 0934    senna (SENOKOT) tablet 17.2 mg  2 tablet Oral QHS Swayze, Ava, DO   17.2 mg at 02/01/21 2129   simethicone (MYLICON) chewable tablet 80 mg  80 mg Oral  QID Tonna Boehringer, Isami, DO   80 mg at 02/02/21 0934   sodium phosphate (FLEET) 7-19 GM/118ML enema 1 enema  1 enema Rectal Daily PRN Mansy, Jan A, MD       traMADol Janean Sark) tablet 50 mg  50 mg Oral Q12H PRN Swayze, Ava, DO       traZODone (DESYREL) tablet 100 mg  100 mg Oral QHS Swayze, Ava, DO   100 mg at 02/01/21 2129   triamcinolone cream (KENALOG) 0.1 % cream 1 application  1 application Topical BID Swayze, Ava, DO   1 application at 01/31/21 7373     Discharge Medications: Please see discharge summary for a list of discharge medications.  Relevant Imaging Results:  Relevant Lab Results:   Additional Information SS #: [id number]  Chapman Fitch, RN

## 2021-02-03 DIAGNOSIS — K5900 Constipation, unspecified: Secondary | ICD-10-CM | POA: Diagnosis not present

## 2021-02-03 LAB — GLUCOSE, CAPILLARY
Glucose-Capillary: 113 mg/dL — ABNORMAL HIGH (ref 70–99)
Glucose-Capillary: 89 mg/dL (ref 70–99)
Glucose-Capillary: 153 mg/dL — ABNORMAL HIGH (ref 70–99)
Glucose-Capillary: 139 mg/dL — ABNORMAL HIGH (ref 70–99)
Glucose-Capillary: 141 mg/dL — ABNORMAL HIGH (ref 70–99)

## 2021-02-03 MED ORDER — MINERAL OIL RE ENEM: 1.0000 | ENEMA | Freq: Every day | RECTAL | 0 refills | Status: DC

## 2021-02-03 MED ORDER — METOPROLOL TARTRATE 50 MG PO TABS
100.0000 mg | ORAL_TABLET | Freq: Two times a day (BID) | ORAL | Status: DC
  Administered 2021-02-03 – 2021-02-05 (×4): 100 mg via ORAL
  Filled 2021-02-03 (×4): qty 2

## 2021-02-03 NOTE — Discharge Summary (Signed)
Physician Discharge Summary  Raymond Hays [id number] DOB: January 29, 1946 DOA: 01/30/2021  PCP: [facility]  Admit date: 01/30/2021 Discharge date: 02/03/2021  Time spent: 40 minutes  Recommendations for Outpatient Follow-up:  Follow outpatient CBC/CMP Follow with GI outpatient for constipation Continue daily enema outpatient to facilitate bowel movement, follow with PCP outpatient to adjust regimen as needed Continue metop and amio for afib, RVR resolved Stop iron due to contribution ot constipation - follow H/H outpatient  Discharge Diagnoses:  Principal Problem:   Obstipation Active Problems:   AF (paroxysmal atrial fibrillation) (HCC)   Chronic pain   COPD with acute exacerbation ([place])   Essential hypertension   AKI (acute kidney injury) ([place])   Type 2 diabetes mellitus with diabetic neuropathy, without long-term current use of insulin ([place])   Parkinson disease ([place])   Respiratory failure ([place])   Anxiety   Iron deficiency anemia   Edema leg   Discharge Condition: stable  Diet recommendation: heart healthy, diabetic  Filed Weights   01/29/21 1802  Weight: 104.5 kg    History of present illness:  75 yo gentleman with hx atrial fibrillation, COPD, chronic constipation, iron def, parkinsons, HTN, and multiple other medical problems presenting with abdominal distension found to have large stool burden in rectum and throughout colon on imaging.  He's improved with daily enemas.  Plan is to discharge with daily enemas and outpatient GI follow up.  Hospital Course:  Obstipation  Abdominal Distension CT with large stool burden in rectum and throughout colon Appreciate surgery recommendations - appreciate assistance - recommending daily enema outpatient to facilitate BM, fiber, stool softner, as well as prn laxatives Regular diet per surgery GI referral outpatient for constipaton   Acute Hypoxic Respiratory Failure Now on RA, suspect degree of SOB related to abdominal  distension CXR 11/2 without acute disease Continue flutter valve SLP eval - lots of transmitted upper airway sounds Resolved   Atrial Fibrillation Continue metoprolol, amiodarone and eliqus RVR resolved with resumption of metoprolol  AKI Resolved, follow with IVF   Anxiety Hydroxyzine  Chronic Pain Tramadol   Parkinson's Sinemet   COPD with exacerbation Transmitted upper airway sounds yesterday -> improved today Continue albuterol, trelegy, and duonebs Hold off of steroids for now Seems improved   T2DM Hold metformin Continue SSI   Iron Def Anemia Stop iron   Hypertension Continue metoprolol   Lower Extremity Edema of RLE Chronic, follow Mild LLE edema today, [place] without DVT, negative BNP - follow outpatient  Procedures: none  Consultations: surgery  Discharge Exam: Vitals:   02/03/21 0427 02/03/21 0802  BP: 101/76 103/61  Pulse: 79 84  Resp: 18 18  Temp: 97.9 F (36.6 C) 98.9 F (37.2 C)  SpO2: 95% 97%   Hasn't had enema yet today Feels ok Discussed d/c plan  General: No acute distress. Cardiovascular: RRR Lungs: unlabored Abdomen: distended, nontener Neurological: Alert and oriented 3. Moves all extremities 4. Cranial nerves II through XII grossly intact. Skin: Warm and dry. No rashes or lesions. Extremities: No clubbing or cyanosis. No edema.   Discharge Instructions   Discharge Instructions     Ambulatory referral to Gastroenterology   Complete by: As directed    Call MD for:  difficulty breathing, headache or visual disturbances   Complete by: As directed    Call MD for:  extreme fatigue   Complete by: As directed    Call MD for:  hives   Complete by: As directed    Call MD for:  persistant dizziness or light-headedness   Complete by: As directed    Call MD for:  persistant nausea and vomiting   Complete by: As directed    Call MD for:  redness, tenderness, or signs of infection (pain, swelling, redness, odor or  green/yellow discharge around incision site)   Complete by: As directed    Call MD for:  severe uncontrolled pain   Complete by: As directed    Call MD for:  temperature >100.4   Complete by: As directed    Diet - low sodium heart healthy   Complete by: As directed    Discharge instructions   Complete by: As directed    You were seen due to abdominal distension due to severe constipation.  You've improved with enemas.  Continue your daily enema as well as your stool softener, senna, and fiber.  I'll refer you to Trisha Ken gastroenterologist for further management of your severe constipation.  Stop your iron as your blood counts are good and this can contribute to constipation.  Return for new, recurrent, or worsening symptoms.  Please ask your PCP to request records from this hospitalization so they know what was done and what the next steps will be.   Increase activity slowly   Complete by: As directed       Allergies as of 02/03/2021   No Known Allergies      Medication List     STOP taking these medications    ferrous sulfate 325 (65 FE) MG tablet       TAKE these medications    acetaminophen 325 MG tablet Commonly known as: TYLENOL Take 650 mg by mouth every 4 (four) hours as needed.   Albuterol Sulfate 108 (90 Base) MCG/ACT Aepb Commonly known as: PROAIR RESPICLICK Inhale 2 puffs into the lungs every 4 (four) hours as needed (COPD).   amiodarone 400 MG tablet Commonly known as: PACERONE Take 1 tablet (400 mg total) by mouth daily.   Anti-Dandruff 1 % Lotn Generic drug: selenium sulfide Apply topically.   apixaban 5 MG Tabs tablet Commonly known as: ELIQUIS Take 5 mg by mouth 2 (two) times daily.   atorvastatin 20 MG tablet Commonly known as: LIPITOR Take 20 mg by mouth daily.   Biofreeze 4 % Gel Generic drug: Menthol (Topical Analgesic) Apply 1 application topically 3 (three) times daily.   bisacodyl 10 MG suppository Commonly known as: DULCOLAX Place  10 mg rectally as needed for moderate constipation.   calcium carbonate 500 MG chewable tablet Commonly known as: TUMS - dosed in mg elemental calcium Chew 2 tablets by mouth every 6 (six) hours as needed for indigestion or heartburn.   carbidopa-levodopa 25-100 MG tablet Commonly known as: SINEMET IR Take 1 tablet by mouth 3 (three) times daily before meals. At 0630, 1130, 1630   cetaphil cream Apply 1 application topically as needed.   escitalopram 5 MG tablet Commonly known as: LEXAPRO Take 5 mg by mouth daily.   feeding supplement (GLUCERNA SHAKE) Liqd Take 237 mLs by mouth 2 (two) times daily between meals.   finasteride 5 MG tablet Commonly known as: PROSCAR Take 5 mg by mouth daily.   fluticasone 50 MCG/ACT nasal spray Commonly known as: FLONASE Place 1 spray into both nostrils daily.   gabapentin 800 MG tablet Commonly known as: NEURONTIN Take 800 mg by mouth 2 (two) times daily.   guaiFENesin 600 MG 12 hr tablet Commonly known as: MUCINEX Take 1 tablet (600 mg total) by mouth  2 (two) times daily.   hydrOXYzine 25 MG tablet Commonly known as: ATARAX/VISTARIL Take 25 mg by mouth 2 (two) times daily as needed for itching.   ipratropium 0.03 % nasal spray Commonly known as: ATROVENT Place 2 sprays into both nostrils 2 (two) times daily.   ipratropium-albuterol 0.5-2.5 (3) MG/3ML Soln Commonly known as: DUONEB Inhale 3 mLs into the lungs daily. And every 6 hours as needed for shortness of breath or wheezing   lactase 3000 units tablet Commonly known as: LACTAID Take 3,000 Units by mouth 3 (three) times daily with meals.   metFORMIN 500 MG 24 hr tablet Commonly known as: GLUCOPHAGE-XR Take 500 mg by mouth daily.   methocarbamol 500 MG tablet Commonly known as: ROBAXIN Take 1,000 mg by mouth in the morning and at bedtime.   metoprolol tartrate 100 MG tablet Commonly known as: LOPRESSOR Take 1 tablet (100 mg total) by mouth 2 (two) times daily.    mineral oil enema Place 133 mLs (1 enema total) rectally daily.   MINERIN CREME EX Apply topically. Apply to feet twice bid   oxybutynin 10 MG 24 hr tablet Commonly known as: DITROPAN-XL Take 1 tablet (10 mg total) by mouth daily.   pantoprazole 40 MG tablet Commonly known as: PROTONIX Take 1 tablet (40 mg total) by mouth 2 (two) times daily before Matilynn Dacey meal. For 8 weeks.   polyethylene glycol 17 g packet Commonly known as: MIRALAX / GLYCOLAX Take 17 g by mouth daily.   psyllium 58.6 % powder Commonly known as: METAMUCIL Take 1 packet by mouth daily.   Sarna lotion Generic drug: camphor-menthol Apply topically.   senna 8.6 MG Tabs tablet Commonly known as: SENOKOT Take 2 tablets by mouth at bedtime.   simethicone 125 MG chewable tablet Commonly known as: MYLICON Chew 0000000 mg by mouth 3 (three) times daily.   STOOL SOFTENER LAXATIVE PO Take 2 tablets by mouth at bedtime.   torsemide 10 MG tablet Commonly known as: DEMADEX Take 10 mg by mouth daily.   traZODone 100 MG tablet Commonly known as: DESYREL Take 100 mg by mouth at bedtime.   Trelegy Ellipta 100-62.5-25 MCG/ACT Aepb Generic drug: Fluticasone-Umeclidin-Vilant Inhale 1 puff into the lungs daily.   triamcinolone cream 0.1 % Commonly known as: KENALOG Apply 1 application topically 2 (two) times daily.       No Known Allergies    The results of significant diagnostics from this hospitalization (including imaging, microbiology, ancillary and laboratory) are listed below for reference.    Significant Diagnostic Studies: CT ABDOMEN PELVIS WO CONTRAST  Result Date: 01/10/2021 CLINICAL DATA:  Nausea, vomiting, upper abdominal pain EXAM: CT ABDOMEN AND PELVIS WITHOUT CONTRAST TECHNIQUE: Multidetector CT imaging of the abdomen and pelvis was performed following the standard protocol without IV contrast. COMPARISON:  CT abdomen/pelvis 10/23/2020 FINDINGS: Lower chest: There is Isabelly Kobler small left pleural effusion.  There is dependent subsegmental atelectasis in both lung bases. Mitral annular calcifications, aortic valve calcifications, and coronary artery calcifications are seen. There is no pericardial effusion. Hepatobiliary: Ryu Cerreta hypodense lesion in the right hepatic lobe near the dome is unchanged, likely Shawnya Mayor cyst. There are no other focal lesions, within the confines of noncontrast technique. There is cholelithiasis without evidence of acute cholecystitis. There is no biliary ductal dilatation. Pancreas: Unremarkable. Spleen: Unremarkable. Adrenals/Urinary Tract: The adrenals are unremarkable. Bilateral renal parenchymal atrophy is again seen. Massa Pe 2.2 cm hypodense lesion adjacent to the posterior left renal cortex is unchanged, likely an exophytic cyst. Additional subcentimeter  hypodense lesions in the kidneys are too small to characterize but likely reflect additional small cysts. There is Kimba Lottes punctate nonobstructing left lower pole stone, unchanged. There are no other calculi. There is no hydronephrosis or hydroureter. The bladder is decompressed by Kortney Potvin suprapubic catheter and not well assessed. Stomach/Bowel: There is an enteric catheter in place with the tip in the stomach. The stomach is otherwise unremarkable. There is no evidence of bowel obstruction. There is Aqib Lough moderate stool burden in the rectum, similar to the prior study. There is no finding to suggest stercoral colitis. There is colonic diverticulosis without evidence of acute diverticulitis. Vascular/Lymphatic: There is scattered calcified atherosclerotic plaque in the nonaneurysmal abdominal aorta. There is no portal venous gas. There is no abdominal or pelvic lymphadenopathy. Reproductive: The prostate and seminal vesicles are not well assessed. Other: There is no ascites or free air. Musculoskeletal: Bilateral hip arthroplasty hardware is noted. The hardware is grossly within expected limits to the level imaged. There is advanced multilevel degenerative change of  the lumbar spine with fusion of the L4 and L5 vertebral bodies, vacuum disc phenomenon at L2-L3, L3-L4, and L5-S1, and flowing anterior osteophytes in the lower thoracic spine. There are postsurgical changes reflecting decompressive laminectomies at L3 through L5. The findings are overall unchanged. There is no acute osseous abnormality or aggressive osseous lesion. IMPRESSION: 1. No acute findings in the abdomen or pelvis. 2. Cholelithiasis without evidence of acute cholecystitis. 3. Diverticulosis without evidence of acute diverticulitis. 4. Moderate stool burden in the rectum without evidence of stercoral colitis. 5. Unchanged punctate nonobstructing left lower pole renal stone. Aortic Atherosclerosis (ICD10-I70.0). Electronically Signed   By: Valetta Mole M.D.   On: 01/10/2021 16:09   DG Chest 1 View  Result Date: 01/29/2021 CLINICAL DATA:  Shortness of breath EXAM: CHEST  1 VIEW COMPARISON:  01/12/2021 FINDINGS: Atherosclerotic calcification of the aortic arch. Mitral valve clips noted. Moderate cardiomegaly. Indistinctness of the pulmonary vasculature compatible with pulmonary venous hypertension, with some mild hazy new suprahilar density on the left could reflect some minimal confluent edema or pneumonia. Low lung volumes are present. No blunting of the costophrenic angles. Stable substantial postoperative hardware in the cervical spine region. IMPRESSION: 1. Moderate enlargement of the cardiopericardial silhouette with pulmonary venous hypertension. Hazy left suprahilar density is increased from prior and could represent some mild asymmetric edema or early pneumonia. 2.  Aortic Atherosclerosis (ICD10-I70.0). 3. Mitral valve clips noted. 4. Low lung volumes. Electronically Signed   By: Van Clines M.D.   On: 01/29/2021 18:12   DG Abd 1 View  Result Date: 01/31/2021 CLINICAL DATA:  Abdominal distension EXAM: ABDOMEN - 1 VIEW COMPARISON:  CT abdomen and pelvis 01/30/2021 FINDINGS: There is  diffuse gaseous distension of bowel in the abdomen, with rectal stool ball present, and no definable transition point. Bilateral hip arthroplasties. IMPRESSION: Diffuse gaseous distention of bowel in the abdomen, with large stool burden in the rectum. Electronically Signed   By: Maurine Simmering M.D.   On: 01/31/2021 17:00   DG Abdomen 1 View  Result Date: 01/10/2021 CLINICAL DATA:  NG tube placement EXAM: ABDOMEN - 1 VIEW COMPARISON:  KUB obtained earlier the same day FINDINGS: The enteric catheter tip projects at the level of the GE junction. The side hole projects over the distal esophagus. Gaseous distention of the stomach and bowel in the imaged upper abdomen is not significantly changed. IMPRESSION: The enteric catheter tip projects at the level of the GE junction, and the side hole  projects over the distal esophagus. Recommend advancement by approximately 7 cm. These results will be called to the ordering clinician or representative by the Radiologist Assistant, and communication documented in the PACS or [employer]. Electronically Signed   By: Valetta Mole M.D.   On: 01/10/2021 12:23   DG Abd 1 View  Result Date: 01/10/2021 CLINICAL DATA:  Abdominal pain EXAM: ABDOMEN - 1 VIEW COMPARISON:  None. FINDINGS: There is marked gaseous distension of the stomach. There is milder distension of the small bowel and colon. No acute osseous abnormality. Bilateral hip arthroplasties. IMPRESSION: Marked gaseous distension of the stomach and milder distension of the small bowel and colon. Patient may benefit from Suhaas Agena nasogastric tube. Electronically Signed   By: Maurine Simmering M.D.   On: 01/10/2021 10:32   CT Angio Chest PE W and/or Wo Contrast  Result Date: 01/09/2021 CLINICAL DATA:  Wheezing and congestion. Atrial fibrillation. Hypoxia. Hypotension. COPD. EXAM: CT ANGIOGRAPHY CHEST WITH CONTRAST TECHNIQUE: Multidetector CT imaging of the chest was performed using the standard protocol during bolus  administration of intravenous contrast. Multiplanar CT image reconstructions and MIPs were obtained to evaluate the vascular anatomy. CONTRAST:  28mL OMNIPAQUE IOHEXOL 350 MG/ML SOLN COMPARISON:  Multiple exams, including CT chest 10/16/2020 FINDINGS: Cardiovascular: Marginal irregularities simulating chronic pulmonary embolus in the left upper lobe pulmonary artery on images 74 through 100 of series 6 correlate with substantial motion artifact and accordingly are likely artifactual in nature. No definite filling defect characteristic for acute pulmonary embolus is identified in the pulmonary arterial tree. Moderate cardiomegaly. Coronary, aortic arch, and branch vessel atherosclerotic vascular disease. Mitral valve clips noted. Mediastinum/Nodes: Prominence of adipose tissues in the upper mediastinum. Suspected fluid in the superior pericardial recess. 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 Dependent atelectasis in both lower lobes. Mild dependent atelectasis in the lingula. Trace left pleural effusion. Upper Abdomen: Stable hypodense 2.0 by 1.6 cm lesion in the right hepatic lobe on image 71 series 5. Dependent densities in the gallbladder suggesting cholelithiasis. Musculoskeletal: Prominent bilateral rotator cuff muscular atrophy. Substantial bilateral degenerative glenohumeral arthropathy with calcifications in the joint favoring chondrocalcinosis. Substantial bilateral sternoclavicular joint arthropathy. Scattered mild intramedullary sclerosis along some ribs, probably incidental. Old deformity of the left medial eleventh rib from an old healed fracture. Thoracic spondylosis. Lower cervical plate and screw fixator. Prominent multilevel thoracic degenerative disc disease multilevel impingement due to spurring. Review of the MIP images confirms the above findings. IMPRESSION: 1. No filling defect is identified in the pulmonary arterial tree  to suggest pulmonary embolus. Reduced sensitivity T2 the degree of motion artifact. 2. 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 3. Mild atelectasis in both lower lobes and in the lingula. 4. Trace left pleural effusion. 5. Cardiomegaly with coronary and aortic atherosclerosis. Mitral valve clips noted. Aortic Atherosclerosis (ICD10-I70.0). 6. Substantial bilateral glenohumeral arthropathy. Prominent bilateral rotator cuff muscular atrophy. 7. Multilevel thoracic spine impingement due to spurring. Electronically Signed   By: Van Clines M.D.   On: 01/09/2021 18:38   CT ABDOMEN PELVIS W CONTRAST  Result Date: 01/30/2021 CLINICAL DATA:  Abdominal distension EXAM: CT ABDOMEN AND PELVIS WITH CONTRAST TECHNIQUE: Multidetector CT imaging of the abdomen and pelvis was performed using the standard protocol following bolus administration of intravenous contrast. CONTRAST:  170mL OMNIPAQUE IOHEXOL 300 MG/ML  SOLN COMPARISON:  Earlier today FINDINGS: Lower chest: Bibasilar scarring or atelectasis. Heart is upper limits normal in size. Mitral valve and coronary artery calcifications again  noted. Hepatobiliary: Layering stones within the gallbladder, stable. Stable small cyst in the posterior right hepatic lobe. No biliary ductal dilatation. Pancreas: No focal abnormality or ductal dilatation. Spleen: No focal abnormality.  Normal size. Adrenals/Urinary Tract: Bilateral cortical thinning. Small exophytic cyst off the midpole of left kidney, stable. No stones or hydronephrosis. Adrenal glands unremarkable. Suprapubic catheter in the bladder which is decompressed. Stomach/Bowel: Large stool burden throughout the colon, most pronounced in the rectum. Cannot exclude fecal impaction. No evidence of bowel obstruction. Vascular/Lymphatic: Aortic atherosclerosis. No evidence of aneurysm or adenopathy. Reproductive: No visible focal abnormality. Other: No free fluid or free air.  Musculoskeletal: Bilateral hip replacements. Degenerative changes in the lumbar spine. No acute bony abnormality. IMPRESSION: Large stool burden in the rectum and throughout the colon. Cannot exclude fecal impaction. Cholelithiasis. Aortic atherosclerosis. Bibasilar scarring or atelectasis. No change since earlier today. Electronically Signed   By: Rolm Baptise M.D.   On: 01/30/2021 20:32   CT Abdomen Pelvis W Contrast  Result Date: 01/29/2021 CLINICAL DATA:  Bowel obstruction suspected EXAM: CT ABDOMEN AND PELVIS WITH CONTRAST TECHNIQUE: Multidetector CT imaging of the abdomen and pelvis was performed using the standard protocol following bolus administration of intravenous contrast. CONTRAST:  176mL OMNIPAQUE IOHEXOL 300 MG/ML  SOLN COMPARISON:  CT abdomen pelvis mental status Q FINDINGS: Lower chest: No acute abnormality. Mitral annular calcifications. Coronary artery calcifications. Tiny hiatal hernia. Hepatobiliary: There is Kadince Boxley 1.8 x 1.3 cm fluid density lesion within the right hepatic lobe likely represents Aleya Durnell simple hepatic cyst. No focal liver abnormality. Layering hyperdensity within the gallbladder lumen. Otherwise no gallbladder wall thickening or pericholecystic fluid. No biliary dilatation. Pancreas: No focal lesion. Normal pancreatic contour. No surrounding inflammatory changes. No main pancreatic ductal dilatation. Spleen: Normal in size without focal abnormality. Adrenals/Urinary Tract: No adrenal nodule bilaterally. Bilateral kidneys enhance symmetrically. 2.2 cm fluid density lesion within left kidney likely represents simple renal cysts. Subcentimeter hypodensities are too small to characterize. No hydronephrosis. No hydroureter.  Punctate left nephrolithiasis. Suprapubic catheter with tip and balloon terminate within the urinary bladder lumen. Associated foci of gas within the lumen of the urinary bladder. Stomach/Bowel: Stomach is within normal limits. No evidence of bowel wall thickening or  dilatation. Stool throughout the ascending colon. Stool within the rectum. Few scattered colonic diverticula. The appendix not definitely identified. Vascular/Lymphatic: No abdominal aorta or iliac aneurysm. Mild atherosclerotic plaque of the aorta and its branches. No abdominal, pelvic, or inguinal lymphadenopathy. Reproductive: Prostate is unremarkable. Other: No intraperitoneal free fluid. No intraperitoneal free gas. No organized fluid collection. Musculoskeletal: No abdominal wall hernia or abnormality. No suspicious lytic or blastic osseous lesions. No acute displaced fracture. Multilevel severe degenerative changes of the spine with associated multilevel severe osseous neural foraminal stenosis. Partially visualized bilateral total hip arthroplasties. IMPRESSION: 1. Cholelithiasis no findings of acute cholecystitis. 2. Few scattered colonic diverticula with no acute diverticulitis. 3. Stool within the rectum with no associated stercoral colitis. 4. Nonobstructive punctate left nephrolithiasis 5. Aortic Atherosclerosis (ICD10-I70.0) as well as mitral annular and coronary calcifications. Electronically Signed   By: Iven Finn M.D.   On: 01/29/2021 20:03   US Venous Img Lower Bilateral (DVT)  Result Date: 02/03/2021 CLINICAL DATA:  Bilateral lower extremity edema, right greater than left. EXAM: BILATERAL LOWER EXTREMITY VENOUS DOPPLER ULTRASOUND TECHNIQUE: Gray-scale sonography with graded compression, as well as color Doppler and duplex ultrasound were performed to evaluate the lower extremity deep venous systems from the level of the common femoral vein and including  the common femoral, femoral, profunda femoral, popliteal and calf veins including the posterior tibial, peroneal and gastrocnemius veins when visible. The superficial great saphenous vein was also interrogated. Spectral Doppler was utilized to evaluate flow at rest and with distal augmentation maneuvers in the common femoral, femoral and  popliteal veins. COMPARISON:  None. FINDINGS: RIGHT LOWER EXTREMITY Common Femoral Vein: No evidence of thrombus. Normal compressibility, respiratory phasicity and response to augmentation. Saphenofemoral Junction: No evidence of thrombus. Normal compressibility and flow on color Doppler imaging. Profunda Femoral Vein: No evidence of thrombus. Normal compressibility and flow on color Doppler imaging. Femoral Vein: No evidence of thrombus. Normal compressibility, respiratory phasicity and response to augmentation. Popliteal Vein: No evidence of thrombus. Normal compressibility, respiratory phasicity and response to augmentation. Calf Veins: No evidence of thrombus. Normal compressibility and flow on color Doppler imaging. Superficial Great Saphenous Vein: No evidence of thrombus. Normal compressibility. Venous Reflux:  None. Other Findings: No evidence of superficial thrombophlebitis or abnormal fluid collection. LEFT LOWER EXTREMITY Common Femoral Vein: No evidence of thrombus. Normal compressibility, respiratory phasicity and response to augmentation. Saphenofemoral Junction: No evidence of thrombus. Normal compressibility and flow on color Doppler imaging. Profunda Femoral Vein: No evidence of thrombus. Normal compressibility and flow on color Doppler imaging. Femoral Vein: No evidence of thrombus. Normal compressibility, respiratory phasicity and response to augmentation. Popliteal Vein: No evidence of thrombus. Normal compressibility, respiratory phasicity and response to augmentation. Calf Veins: No evidence of thrombus. Normal compressibility and flow on color Doppler imaging. Superficial Great Saphenous Vein: No evidence of thrombus. Normal compressibility. Venous Reflux:  None. Other Findings: No evidence of superficial thrombophlebitis or abnormal fluid collection. IMPRESSION: No evidence of deep venous thrombosis in either lower extremity. Electronically Signed   By: Aletta Edouard M.D.   On: 02/03/2021  07:53   US Venous Img Lower Bilateral (DVT)  Result Date: 01/10/2021 CLINICAL DATA:  Lower extremity edema EXAM: BILATERAL LOWER EXTREMITY VENOUS DOPPLER ULTRASOUND TECHNIQUE: Gray-scale sonography with compression, as well as color and duplex ultrasound, were performed to evaluate the deep venous system(s) from the level of the common femoral vein through the popliteal and proximal calf veins. COMPARISON:  None. FINDINGS: VENOUS Normal compressibility of the common femoral, superficial femoral, and popliteal veins, as well as the visualized calf veins. Visualized portions of profunda femoral vein and great saphenous vein unremarkable. No filling defects to suggest DVT on grayscale or color Doppler imaging. Doppler waveforms show normal direction of venous flow, normal respiratory plasticity and response to augmentation. Limited views of the contralateral common femoral vein are unremarkable. OTHER None. Limitations: none IMPRESSION: Negative. Electronically Signed   By: Ulyses Jarred M.D.   On: 01/10/2021 02:12   DG Chest Port 1 View  Result Date: 01/31/2021 CLINICAL DATA:  Abdominal pain.  COPD. EXAM: PORTABLE CHEST 1 VIEW COMPARISON:  01/29/2021 and prior exams. FINDINGS: Cardiac silhouette is mildly enlarged. No mediastinal or hilar masses. Low lung volumes.  Allowing for this, lungs are clear. No convincing pleural effusion or pneumothorax. Skeletal structures are grossly intact. IMPRESSION: No acute cardiopulmonary disease and no significant change from the most recent prior study. Electronically Signed   By: Lajean Manes M.D.   On: 01/31/2021 10:40   DG Chest Port 1 View  Result Date: 01/12/2021 CLINICAL DATA:  Cough.  COPD.  Diabetes. EXAM: PORTABLE CHEST 1 VIEW COMPARISON:  01/09/2021 FINDINGS: Cervical spine fixation. Apical lordotic positioning. Patient rotated left. Cardiomegaly accentuated by AP portable technique. Atherosclerosis in the transverse aorta. No pleural  effusion or  pneumothorax. Low lung volumes with resultant pulmonary interstitial prominence. No overt congestive failure. Numerous leads and wires project over the chest. No lobar consolidation. IMPRESSION: Cardiomegaly and low lung volumes.  No acute findings. Aortic Atherosclerosis (ICD10-I70.0). Electronically Signed   By: Abigail Miyamoto M.D.   On: 01/12/2021 16:31   DG Chest Portable 1 View  Result Date: 01/09/2021 CLINICAL DATA:  Shortness of breath, wheezing, congestion EXAM: PORTABLE CHEST 1 VIEW COMPARISON:  Chest radiograph 12/04/2020 FINDINGS: The heart appears mildly enlarged, though this is likely exaggerated by AP technique and low lung volumes. The mediastinal contours are stable. There is calcified atherosclerotic plaque of the aortic arch. Lung volumes are low, with unchanged asymmetric elevation of the right hemidiaphragm. Linear opacities in the lateral left base likely reflect atelectasis or scar. There is no other focal consolidation. There is no pulmonary edema. There is no definite pneumothorax, though the apices are suboptimally evaluated. There is no acute osseous abnormality. IMPRESSION: Low lung volumes. Otherwise, no radiographic evidence of acute cardiopulmonary process. Electronically Signed   By: Valetta Mole M.D.   On: 01/09/2021 16:24    Microbiology: Recent Results (from the past 240 hour(s))  Resp Panel by RT-PCR (Flu Lelania Bia&B, Covid) Nasopharyngeal Swab     Status: None   Collection Time: 01/30/21 12:21 AM   Specimen: Nasopharyngeal Swab; Nasopharyngeal(NP) swabs in vial transport medium  Result Value Ref Range Status   SARS Coronavirus 2 by RT PCR NEGATIVE NEGATIVE Final    Comment: (NOTE) SARS-CoV-2 target nucleic acids are NOT DETECTED.  The SARS-CoV-2 RNA is generally detectable in upper respiratory specimens during the acute phase of infection. The lowest concentration of SARS-CoV-2 viral copies this assay can detect is 138 copies/mL. Javanni Maring negative result does not preclude  SARS-Cov-2 infection and should not be used as the sole basis for treatment or other patient management decisions. Jorma Tassinari negative result may occur with  improper specimen collection/handling, submission of specimen other than nasopharyngeal swab, presence of viral mutation(s) within the areas targeted by this assay, and inadequate number of viral copies(<138 copies/mL). Nazim Kadlec negative result must be combined with clinical observations, patient history, and epidemiological information. The expected result is Negative.  Fact Sheet for Patients:  EntrepreneurPulse.com.au  Fact Sheet for Healthcare Providers:  IncredibleEmployment.be  This test is no t yet approved or cleared by the [place] FDA and  has been authorized for detection and/or diagnosis of SARS-CoV-2 by FDA under an Emergency Use Authorization (EUA). This EUA will remain  in effect (meaning this test can be used) for the duration of the COVID-19 declaration under Section 564(b)(1) of the Act, 21 U.S.C.section 360bbb-3(b)(1), unless the authorization is terminated  or revoked sooner.       Influenza Oreste Majeed by PCR NEGATIVE NEGATIVE Final   Influenza B by PCR NEGATIVE NEGATIVE Final    Comment: (NOTE) The Xpert Xpress SARS-CoV-2/FLU/RSV plus assay is intended as an aid in the diagnosis of influenza from Nasopharyngeal swab specimens and should not be used as Walker Paddack sole basis for treatment. Nasal washings and aspirates are unacceptable for Xpert Xpress SARS-CoV-2/FLU/RSV testing.  Fact Sheet for Patients: EntrepreneurPulse.com.au  Fact Sheet for Healthcare Providers: IncredibleEmployment.be  This test is not yet approved or cleared by the [place] FDA and has been authorized for detection and/or diagnosis of SARS-CoV-2 by FDA under an Emergency Use Authorization (EUA). This EUA will remain in effect (meaning this test can be used) for the duration of  the COVID-19 declaration under Section  564(b)(1) of the Act, 21 U.S.C. section 360bbb-3(b)(1), unless the authorization is terminated or revoked.  Performed at [facility], [address] [place], [place] [address]      Labs: Basic Metabolic Panel: Recent Labs  Lab 01/29/21 1758 01/30/21 1450 01/31/21 0503 02/01/21 0439 02/02/21 0434  NA 136  --  138 138 142  K 4.0  --  3.4* 3.8 3.5  CL 101  --  103 104 108  CO2 26  --  27 27 27   GLUCOSE 191*  --  121* 117* 111*  BUN 47*  --  34* 23 15  CREATININE 1.45* 1.24 1.00 0.82 0.88  CALCIUM 9.2  --  8.5* 8.5* 8.3*  MG  --   --  2.5* 2.2 2.0  PHOS  --   --   --  2.8 2.3*   Liver Function Tests: Recent Labs  Lab 01/29/21 1758 01/31/21 0503 02/01/21 0439 02/02/21 0434  AST 13* 11* 10* 10*  ALT 9 18 5 13   ALKPHOS 73 56 53 47  BILITOT 1.4* 1.1 1.2 1.1  PROT 7.1 5.5* 5.3* 4.7*  ALBUMIN 4.0 3.1* 3.0* 2.5*   Recent Labs  Lab 01/29/21 1758  LIPASE 30   No results for input(s): AMMONIA in the last 168 hours. CBC: Recent Labs  Lab 01/29/21 1758 01/30/21 1450 01/31/21 0503 02/01/21 0439 02/02/21 0434  WBC 15.8* 12.3* 9.2 7.8 7.3  NEUTROABS 13.1*  --   --  6.2 6.2  HGB 16.6 14.7 14.0 13.2 11.8*  HCT 47.0 43.3 40.0 40.1 36.4*  MCV 92.7 94.1 93.5 95.9 97.3  PLT 287 198 174 162 143*   Cardiac Enzymes: No results for input(s): CKTOTAL, CKMB, CKMBINDEX, TROPONINI in the last 168 hours. BNP: BNP (last 3 results) Recent Labs    01/09/21 1547 01/29/21 1758 02/02/21 0434  BNP 263.7* 232.9* 56.3    ProBNP (last 3 results) No results for input(s): PROBNP in the last 8760 hours.  CBG: Recent Labs  Lab 02/02/21 1719 02/02/21 2016 02/02/21 2342 02/03/21 0425 02/03/21 0804  GLUCAP 114* 171* 157* 113* 89       Signed:  13/05/22 MD.  [facility] 02/03/2021, 11:52 AM

## 2021-02-03 NOTE — TOC Progression Note (Signed)
Transition of Care [facility]) - Progression Note    Patient Details  Name: Raymond Hays MRN: [id number] Date of Birth: 08-01-1945  Transition of Care [facility] CM/SW Contact  Liliana Cline, LCSW Phone Number: 02/03/2021, 11:55 AM  Clinical Narrative:   Reached out to Gavin Pound at [facility] who stated they cannot take patient back over the weekend.     Expected Discharge Plan: Skilled Nursing Facility Barriers to Discharge: Continued Medical Work up  Expected Discharge Plan and Services Expected Discharge Plan: Skilled Nursing Facility         Expected Discharge Date: 02/03/21                                     Social Determinants of Health (SDOH) Interventions    Readmission Risk Interventions Readmission Risk Prevention Plan 01/12/2021 10/20/2020  Transportation Screening Complete Complete  PCP or Specialist Appt within 3-5 Days - Complete  HRI or Home Care Consult - Complete  Palliative Care Screening - Not Applicable  Medication Review (RN Care Manager) Complete Referral to Pharmacy  PCP or Specialist appointment within 3-5 days of discharge Complete -  HRI or Home Care Consult Complete -  SW Recovery Care/Counseling Consult Complete -  Palliative Care Screening Not Applicable -  Skilled Nursing Facility Complete -

## 2021-02-04 DIAGNOSIS — K5900 Constipation, unspecified: Secondary | ICD-10-CM | POA: Diagnosis not present

## 2021-02-04 LAB — CBC WITH DIFFERENTIAL/PLATELET
Abs Immature Granulocytes: 0.09 10*3/uL — ABNORMAL HIGH (ref 0.00–0.07)
Basophils Absolute: 0 10*3/uL (ref 0.0–0.1)
Basophils Relative: 1 %
Eosinophils Absolute: 0.2 10*3/uL (ref 0.0–0.5)
Eosinophils Relative: 4 %
HCT: 36.6 % — ABNORMAL LOW (ref 39.0–52.0)
Hemoglobin: 11.9 g/dL — ABNORMAL LOW (ref 13.0–17.0)
Immature Granulocytes: 1 %
Lymphocytes Relative: 17 %
Lymphs Abs: 1 10*3/uL (ref 0.7–4.0)
MCH: 31.5 pg (ref 26.0–34.0)
MCHC: 32.5 g/dL (ref 30.0–36.0)
MCV: 96.8 fL (ref 80.0–100.0)
Monocytes Absolute: 0.4 10*3/uL (ref 0.1–1.0)
Monocytes Relative: 7 %
Neutro Abs: 4.4 10*3/uL (ref 1.7–7.7)
Neutrophils Relative %: 70 %
Platelets: 120 10*3/uL — ABNORMAL LOW (ref 150–400)
RBC: 3.78 MIL/uL — ABNORMAL LOW (ref 4.22–5.81)
RDW: 18 % — ABNORMAL HIGH (ref 11.5–15.5)
WBC: 6.2 10*3/uL (ref 4.0–10.5)
nRBC: 0 % (ref 0.0–0.2)

## 2021-02-04 LAB — COMPREHENSIVE METABOLIC PANEL WITH GFR
ALT: 15 U/L (ref 0–44)
AST: 13 U/L — ABNORMAL LOW (ref 15–41)
Albumin: 2.2 g/dL — ABNORMAL LOW (ref 3.5–5.0)
Alkaline Phosphatase: 59 U/L (ref 38–126)
Anion gap: 4 — ABNORMAL LOW (ref 5–15)
BUN: 13 mg/dL (ref 8–23)
CO2: 26 mmol/L (ref 22–32)
Calcium: 7.8 mg/dL — ABNORMAL LOW (ref 8.9–10.3)
Chloride: 106 mmol/L (ref 98–111)
Creatinine, Ser: 0.78 mg/dL (ref 0.61–1.24)
GFR, Estimated: >60 mL/min
Glucose, Bld: 124 mg/dL — ABNORMAL HIGH (ref 70–99)
Potassium: 3.7 mmol/L (ref 3.5–5.1)
Sodium: 136 mmol/L (ref 135–145)
Total Bilirubin: 1 mg/dL (ref 0.3–1.2)
Total Protein: 4.8 g/dL — ABNORMAL LOW (ref 6.5–8.1)

## 2021-02-04 LAB — GLUCOSE, CAPILLARY
Glucose-Capillary: 107 mg/dL — ABNORMAL HIGH (ref 70–99)
Glucose-Capillary: 121 mg/dL — ABNORMAL HIGH (ref 70–99)
Glucose-Capillary: 121 mg/dL — ABNORMAL HIGH (ref 70–99)
Glucose-Capillary: 148 mg/dL — ABNORMAL HIGH (ref 70–99)
Glucose-Capillary: 150 mg/dL — ABNORMAL HIGH (ref 70–99)
Glucose-Capillary: 168 mg/dL — ABNORMAL HIGH (ref 70–99)
Glucose-Capillary: 170 mg/dL — ABNORMAL HIGH (ref 70–99)

## 2021-02-04 LAB — MAGNESIUM: Magnesium: 1.8 mg/dL (ref 1.7–2.4)

## 2021-02-04 LAB — PHOSPHORUS: Phosphorus: 2.2 mg/dL — ABNORMAL LOW (ref 2.5–4.6)

## 2021-02-04 NOTE — Progress Notes (Signed)
   02/03/21 1956  Assess: MEWS Score  Temp 98.2 F (36.8 C)  BP 96/72  Pulse Rate (!) 106  Resp 20  SpO2 99 %  O2 Device Room Air  Assess: MEWS Score  MEWS Temp 0  MEWS Systolic 1  MEWS Pulse 1  MEWS RR 0  MEWS LOC 0  MEWS Score 2  MEWS Score Color Yellow  Assess: if the MEWS score is Yellow or Red  Were vital signs taken at a resting state? Yes  Focused Assessment Change from prior assessment (see assessment flowsheet)  Does the patient meet 2 or more of the SIRS criteria? No  Does the patient have a confirmed or suspected source of infection? No  MEWS guidelines implemented *See Row Information* No, vital signs rechecked  Assess: SIRS CRITERIA  SIRS Temperature  0  SIRS Pulse 1  SIRS Respirations  0  SIRS WBC 0  SIRS Score Sum  1

## 2021-02-04 NOTE — Progress Notes (Signed)
PROGRESS NOTE    Raymond Hays  YCX:[id number] DOB: 01/28/1946 DOA: 01/30/2021 PCP: [facility]   Chief Complaint  Patient presents with   Abdominal Pain   Brief Narrative:  75 yo gentleman with hx atrial fibrillation, COPD, chronic constipation, iron def, parkinsons, HTN, and multiple other medical problems presenting with abdominal distension found to have large stool burden in rectum and throughout colon on imaging.  Assessment & Plan:   Principal Problem:   Obstipation Active Problems:   AF (paroxysmal atrial fibrillation) (HCC)   Chronic pain   COPD with acute exacerbation (HCC)   Essential hypertension   AKI (acute kidney injury) (HCC)   Type 2 diabetes mellitus with diabetic neuropathy, without long-term current use of insulin (HCC)   Parkinson disease (HCC)   Respiratory failure (HCC)   Anxiety   Iron deficiency anemia   Edema leg  Obstipation  Abdominal Distension CT with large stool burden in rectum and throughout colon Appreciate surgery recommendations - appreciate assistance - recommending daily enema outpatient to facilitate BM, fiber, stool softner, as well as prn laxatives Regular diet per surgery Will follow - likely d/c tomorrow - not able to take back to facility on wknd  Acute Hypoxic Respiratory Failure Now on RA, suspect degree of SOB related to abdominal distension CXR today without acute disease Continue flutter valve SLP eval - lots of transmitted upper airway sounds Resolved  Atrial Fibrillation Continue metoprolol, amiodarone and eliqus Improved with metop  AKI Resolved, follow with IVF  Anxiety Hydroxyzine  Chronic Pain Tramadol  Parkinson's Sinemet  COPD with exacerbation Transmitted upper airway sounds -> mild, follow Continue albuterol, trelegy, and duonebs Hold off of steroids for now Seems improved  T2DM Hold metformin Continue SSI  Iron Def Anemia Stop iron  Hypertension Continue metoprolol  Lower  Extremity Edema of RLE Chronic, follow Developing Left lower extremity edema, follow BNP and LE [place] both negative  DVT prophylaxis: eliquis Code Status: full  Family Communication: discussed with sister today Disposition:   Status is: Observation  The patient will require care spanning > 2 midnights and should be moved to inpatient because: continued abdominal distension and pain      Consultants:  General surgery  Procedures:  none  Antimicrobials:  Anti-infectives (From admission, onward)    None          Subjective: In good spirits, feeling better  Objective: Vitals:   02/03/21 1956 02/03/21 2000 02/04/21 0455 02/04/21 0750  BP: 96/72  (!) 83/59 110/74  Pulse: (!) 106 96 81 79  Resp: 20   (!) 21  Temp: 98.2 F (36.8 C)  98.5 F (36.9 C) 98 F (36.7 C)  TempSrc: Oral  Oral Oral  SpO2: 99%  97% 99%  Weight:      Height:        Intake/Output Summary (Last 24 hours) at 02/04/2021 1257 Last data filed at 02/04/2021 1000 Gross per 24 hour  Intake 600 ml  Output 450 ml  Net 150 ml   Filed Weights   01/29/21 1802  Weight: 104.5 kg    Examination:  General: No acute distress. Cardiovascular: RRR Lungs: some upper airway sounds, unlabored Abdomen: distended, nontender Neurological: Alert and oriented 3. Moves all extremities 4 with equal strength. Cranial nerves II through XII grossly intact. Skin: Warm and dry. No rashes or lesions. Extremities: chronic RLE edema      Data Reviewed: I have personally reviewed following labs and imaging studies  CBC: Recent Labs  Lab 01/29/21 1758 01/30/21 1450 01/31/21 0503 02/01/21 0439 02/02/21 0434 02/04/21 0456  WBC 15.8* 12.3* 9.2 7.8 7.3 6.2  NEUTROABS 13.1*  --   --  6.2 6.2 4.4  HGB 16.6 14.7 14.0 13.2 11.8* 11.9*  HCT 47.0 43.3 40.0 40.1 36.4* 36.6*  MCV 92.7 94.1 93.5 95.9 97.3 96.8  PLT 287 198 174 162 143* 120*    Basic Metabolic Panel: Recent Labs  Lab 01/29/21 1758  01/30/21 1450 01/31/21 0503 02/01/21 0439 02/02/21 0434 02/04/21 0456  NA 136  --  138 138 142 136  K 4.0  --  3.4* 3.8 3.5 3.7  CL 101  --  103 104 108 106  CO2 26  --  27 27 27 26   GLUCOSE 191*  --  121* 117* 111* 124*  BUN 47*  --  34* 23 15 13   CREATININE 1.45* 1.24 1.00 0.82 0.88 0.78  CALCIUM 9.2  --  8.5* 8.5* 8.3* 7.8*  MG  --   --  2.5* 2.2 2.0 1.8  PHOS  --   --   --  2.8 2.3* 2.2*    GFR: Estimated Creatinine Clearance: 96.7 mL/min (by C-G formula based on SCr of 0.78 mg/dL).  Liver Function Tests: Recent Labs  Lab 01/29/21 1758 01/31/21 0503 02/01/21 0439 02/02/21 0434 02/04/21 0456  AST 13* 11* 10* 10* 13*  ALT 9 18 5 13 15   ALKPHOS 73 56 53 47 59  BILITOT 1.4* 1.1 1.2 1.1 1.0  PROT 7.1 5.5* 5.3* 4.7* 4.8*  ALBUMIN 4.0 3.1* 3.0* 2.5* 2.2*    CBG: Recent Labs  Lab 02/03/21 1957 02/04/21 0011 02/04/21 0356 02/04/21 0747 02/04/21 1137  GLUCAP 141* 168* 121* 107* 170*     Recent Results (from the past 240 hour(s))  Resp Panel by RT-PCR (Flu Keanon Bevins&B, Covid) Nasopharyngeal Swab     Status: None   Collection Time: 01/30/21 12:21 AM   Specimen: Nasopharyngeal Swab; Nasopharyngeal(NP) swabs in vial transport medium  Result Value Ref Range Status   SARS Coronavirus 2 by RT PCR NEGATIVE NEGATIVE Final    Comment: (NOTE) SARS-CoV-2 target nucleic acids are NOT DETECTED.  The SARS-CoV-2 RNA is generally detectable in upper respiratory specimens during the acute phase of infection. The lowest concentration of SARS-CoV-2 viral copies this assay can detect is 138 copies/mL. Saul Dorsi negative result does not preclude SARS-Cov-2 infection and should not be used as the sole basis for treatment or other patient management decisions. Dawnielle Christiana negative result may occur with  improper specimen collection/handling, submission of specimen other than nasopharyngeal swab, presence of viral mutation(s) within the areas targeted by this assay, and inadequate number of  viral copies(<138 copies/mL). Ronnita Paz negative result must be combined with clinical observations, patient history, and epidemiological information. The expected result is Negative.  Fact Sheet for Patients:  13/06/22  Fact Sheet for Healthcare Providers:  13/06/22  This test is no t yet approved or cleared by the 13/01/22 FDA and  has been authorized for detection and/or diagnosis of SARS-CoV-2 by FDA under an Emergency Use Authorization (EUA). This EUA will remain  in effect (meaning this test can be used) for the duration of the COVID-19 declaration under Section 564(b)(1) of the Act, 21 U.S.C.section 360bbb-3(b)(1), unless the authorization is terminated  or revoked sooner.       Influenza Felisha Claytor by PCR NEGATIVE NEGATIVE Final   Influenza B by PCR NEGATIVE NEGATIVE Final    Comment: (NOTE) The Xpert Xpress SARS-CoV-2/FLU/RSV plus assay is  intended as an aid in the diagnosis of influenza from Nasopharyngeal swab specimens and should not be used as Manvir Thorson sole basis for treatment. Nasal washings and aspirates are unacceptable for Xpert Xpress SARS-CoV-2/FLU/RSV testing.  Fact Sheet for Patients: BloggerCourse.com  Fact Sheet for Healthcare Providers: SeriousBroker.it  This test is not yet approved or cleared by the [place] FDA and has been authorized for detection and/or diagnosis of SARS-CoV-2 by FDA under an Emergency Use Authorization (EUA). This EUA will remain in effect (meaning this test can be used) for the duration of the COVID-19 declaration under Section 564(b)(1) of the Act, 21 U.S.C. section 360bbb-3(b)(1), unless the authorization is terminated or revoked.  Performed at [facility], [address] [place], [place] [address]          Radiology Studies: US Venous Img Lower Bilateral (DVT)  Result Date: 02/03/2021 CLINICAL DATA:   Bilateral lower extremity edema, right greater than left. EXAM: BILATERAL LOWER EXTREMITY VENOUS DOPPLER ULTRASOUND TECHNIQUE: Gray-scale sonography with graded compression, as well as color Doppler and duplex ultrasound were performed to evaluate the lower extremity deep venous systems from the level of the common femoral vein and including the common femoral, femoral, profunda femoral, popliteal and calf veins including the posterior tibial, peroneal and gastrocnemius veins when visible. The superficial great saphenous vein was also interrogated. Spectral Doppler was utilized to evaluate flow at rest and with distal augmentation maneuvers in the common femoral, femoral and popliteal veins. COMPARISON:  None. FINDINGS: RIGHT LOWER EXTREMITY Common Femoral Vein: No evidence of thrombus. Normal compressibility, respiratory phasicity and response to augmentation. Saphenofemoral Junction: No evidence of thrombus. Normal compressibility and flow on color Doppler imaging. Profunda Femoral Vein: No evidence of thrombus. Normal compressibility and flow on color Doppler imaging. Femoral Vein: No evidence of thrombus. Normal compressibility, respiratory phasicity and response to augmentation. Popliteal Vein: No evidence of thrombus. Normal compressibility, respiratory phasicity and response to augmentation. Calf Veins: No evidence of thrombus. Normal compressibility and flow on color Doppler imaging. Superficial Great Saphenous Vein: No evidence of thrombus. Normal compressibility. Venous Reflux:  None. Other Findings: No evidence of superficial thrombophlebitis or abnormal fluid collection. LEFT LOWER EXTREMITY Common Femoral Vein: No evidence of thrombus. Normal compressibility, respiratory phasicity and response to augmentation. Saphenofemoral Junction: No evidence of thrombus. Normal compressibility and flow on color Doppler imaging. Profunda Femoral Vein: No evidence of thrombus. Normal compressibility and flow on color  Doppler imaging. Femoral Vein: No evidence of thrombus. Normal compressibility, respiratory phasicity and response to augmentation. Popliteal Vein: No evidence of thrombus. Normal compressibility, respiratory phasicity and response to augmentation. Calf Veins: No evidence of thrombus. Normal compressibility and flow on color Doppler imaging. Superficial Great Saphenous Vein: No evidence of thrombus. Normal compressibility. Venous Reflux:  None. Other Findings: No evidence of superficial thrombophlebitis or abnormal fluid collection. IMPRESSION: No evidence of deep venous thrombosis in either lower extremity. Electronically Signed   By: Irish Lack M.D.   On: 02/03/2021 07:53        Scheduled Meds:  amiodarone  400 mg Oral Daily   apixaban  5 mg Oral BID   bisacodyl  10 mg Rectal Once   bisacodyl  10 mg Rectal Daily   carbidopa-levodopa  1 tablet Oral TID AC   escitalopram  5 mg Oral Daily   finasteride  5 mg Oral Daily   fluticasone furoate-vilanterol  1 puff Inhalation Daily   And   umeclidinium bromide  1 puff Inhalation Daily   gabapentin  800 mg Oral BID   hydrocerin  1 application Topical BID   insulin aspart  0-20 Units Subcutaneous Q4H   ipratropium  2 spray Each Nare BID   lactase  3,000 Units Oral TID WC   metoprolol tartrate  100 mg Oral BID   mineral oil  1 enema Rectal BID   pantoprazole  40 mg Oral BID AC   psyllium  1 packet Oral Daily   senna  2 tablet Oral QHS   simethicone  80 mg Oral QID   traZODone  100 mg Oral QHS   triamcinolone cream  1 application Topical BID   Continuous Infusions:     LOS: 4 days    Time spent: over 30 min    Lacretia Nicks, MD [facility]   To contact the attending provider between 7A-7P or the covering provider during after hours 7P-7A, please log into the web site [email or web address] and access using universal [place] password for that web site. If you do not have the password, please call the hospital  operator.  02/04/2021, 12:57 PM

## 2021-02-05 DIAGNOSIS — K5900 Constipation, unspecified: Secondary | ICD-10-CM | POA: Diagnosis not present

## 2021-02-05 LAB — CBC WITH DIFFERENTIAL/PLATELET
Abs Immature Granulocytes: 0.05 10*3/uL (ref 0.00–0.07)
Basophils Absolute: 0 10*3/uL (ref 0.0–0.1)
Basophils Relative: 1 %
Eosinophils Absolute: 0.3 10*3/uL (ref 0.0–0.5)
Eosinophils Relative: 6 %
HCT: 38 % — ABNORMAL LOW (ref 39.0–52.0)
Hemoglobin: 12.1 g/dL — ABNORMAL LOW (ref 13.0–17.0)
Immature Granulocytes: 1 %
Lymphocytes Relative: 21 %
Lymphs Abs: 1 10*3/uL (ref 0.7–4.0)
MCH: 30.9 pg (ref 26.0–34.0)
MCHC: 31.8 g/dL (ref 30.0–36.0)
MCV: 97.2 fL (ref 80.0–100.0)
Monocytes Absolute: 0.4 10*3/uL (ref 0.1–1.0)
Monocytes Relative: 7 %
Neutro Abs: 3 10*3/uL (ref 1.7–7.7)
Neutrophils Relative %: 64 %
Platelets: 122 10*3/uL — ABNORMAL LOW (ref 150–400)
RBC: 3.91 MIL/uL — ABNORMAL LOW (ref 4.22–5.81)
RDW: 18.2 % — ABNORMAL HIGH (ref 11.5–15.5)
WBC: 4.7 10*3/uL (ref 4.0–10.5)
nRBC: 0 % (ref 0.0–0.2)

## 2021-02-05 LAB — COMPREHENSIVE METABOLIC PANEL
ALT: 13 U/L (ref 0–44)
AST: 8 U/L — ABNORMAL LOW (ref 15–41)
Albumin: 2.3 g/dL — ABNORMAL LOW (ref 3.5–5.0)
Alkaline Phosphatase: 52 U/L (ref 38–126)
Anion gap: 6 (ref 5–15)
BUN: 16 mg/dL (ref 8–23)
CO2: 26 mmol/L (ref 22–32)
Calcium: 8.1 mg/dL — ABNORMAL LOW (ref 8.9–10.3)
Chloride: 109 mmol/L (ref 98–111)
Creatinine, Ser: 0.81 mg/dL (ref 0.61–1.24)
GFR, Estimated: >60 mL/min (ref >60)
Glucose, Bld: 110 mg/dL — ABNORMAL HIGH (ref 70–99)
Potassium: 3.7 mmol/L (ref 3.5–5.1)
Sodium: 141 mmol/L (ref 135–145)
Total Bilirubin: 0.7 mg/dL (ref 0.3–1.2)
Total Protein: 4.8 g/dL — ABNORMAL LOW (ref 6.5–8.1)

## 2021-02-05 LAB — GLUCOSE, CAPILLARY
Glucose-Capillary: 111 mg/dL — ABNORMAL HIGH (ref 70–99)
Glucose-Capillary: 111 mg/dL — ABNORMAL HIGH (ref 70–99)
Glucose-Capillary: 117 mg/dL — ABNORMAL HIGH (ref 70–99)

## 2021-02-05 LAB — PHOSPHORUS: Phosphorus: 2.5 mg/dL (ref 2.5–4.6)

## 2021-02-05 LAB — RESP PANEL BY RT-PCR (FLU A&B, COVID) ARPGX2
Influenza A by PCR: NEGATIVE
Influenza B by PCR: NEGATIVE
SARS Coronavirus 2 by RT PCR: NEGATIVE

## 2021-02-05 LAB — MAGNESIUM: Magnesium: 1.7 mg/dL (ref 1.7–2.4)

## 2021-02-05 NOTE — TOC Transition Note (Signed)
Transition of Care [facility]) - CM/SW Discharge Note   Patient Details  Name: Raymond Hays MRN: [id number] Date of Birth: 07-26-1945  [facility]) CM/SW Contact:  Chapman Fitch, RN Phone Number: 02/05/2021, 2:27 PM   Clinical Narrative:    Patient will DC to:[facility] Anticipated DC date:02/05/21  Family notified:Patient to notify family Transport by:EMS  Per MD patient ready for DC to . RN, patient, , and facility notified of DC. Discharge Summary sent to facility. RN given number for report. DC packet on chart. Ambulance transport requested for patient.  TOC signing off.  Bevelyn Ngo [facility] [phone]    Final next level of care: Skilled Nursing Facility Barriers to Discharge: Barriers Resolved   Patient Goals and CMS Choice        Discharge Placement              Patient chooses bed at: [facility] Patient to be transferred to facility by: EMS      Discharge Plan and Services                                     Social Determinants of Health (SDOH) Interventions     Readmission Risk Interventions Readmission Risk Prevention Plan 01/12/2021 10/20/2020  Transportation Screening Complete Complete  PCP or Specialist Appt within 3-5 Days - Complete  HRI or Home Care Consult - Complete  Palliative Care Screening - Not Applicable  Medication Review (RN Care Manager) Complete Referral to Pharmacy  PCP or Specialist appointment within 3-5 days of discharge Complete -  HRI or Home Care Consult Complete -  SW Recovery Care/Counseling Consult Complete -  Palliative Care Screening Not Applicable -  Skilled Nursing Facility Complete -

## 2021-02-05 NOTE — Plan of Care (Addendum)
DISCHARGE NOTE [facility] to be discharged [facility] per MD order. Patient verbalized understanding.  Skin clean, dry and intact without evidence of skin break down, no evidence of skin tears noted. IV catheter discontinued intact. Site without signs and symptoms of complications. Dressing and pressure applied. Pt denies pain at the site currently. No complaints noted.  Patient free of lines, drains, and wounds.   Discharge packet assembled. An After Visit Summary (AVS) was printed and given to the EMS personnel. Patient escorted via stretcher and discharged to [facility] via ambulance. Attempted to call report to accepting facility, message left with call back information.  Received return call back from [place] at [facility], report given and questions answered.  Arlice Colt, RN

## 2021-02-05 NOTE — Discharge Summary (Signed)
Physician Discharge Summary  Raymond Hays [id number] DOB: 09-10-1945 DOA: 01/30/2021  PCP: [facility]  Admit date: 01/30/2021 Discharge date: 02/05/2021  Time spent: 40 minutes  Recommendations for Outpatient Follow-up:  Follow outpatient CBC/CMP Follow with GI outpatient for constipation Continue daily enema outpatient to facilitate bowel movement, follow with PCP outpatient to adjust regimen as needed Continue metop and amio for afib, RVR resolved Stop iron due to contribution ot constipation - follow H/H outpatient  Discharge Diagnoses:  Principal Problem:   Obstipation Active Problems:   AF (paroxysmal atrial fibrillation) (HCC)   Chronic pain   COPD with acute exacerbation ([place])   Essential hypertension   AKI (acute kidney injury) ([place])   Type 2 diabetes mellitus with diabetic neuropathy, without long-term current use of insulin ([place])   Parkinson disease ([place])   Respiratory failure ([place])   Anxiety   Iron deficiency anemia   Edema leg   Discharge Condition: stable  Diet recommendation: heart healthy, diabetic  Filed Weights   01/29/21 1802  Weight: 104.5 kg    History of present illness:  75 yo gentleman with hx atrial fibrillation, COPD, chronic constipation, iron def, parkinsons, HTN, and multiple other medical problems presenting with abdominal distension found to have large stool burden in rectum and throughout colon on imaging.  He's improved with daily enemas.  Plan is to discharge with daily enemas and outpatient GI follow up.  Hospital Course:  Obstipation  Abdominal Distension CT with large stool burden in rectum and throughout colon Appreciate surgery recommendations - appreciate assistance - recommending daily enema outpatient to facilitate BM, fiber, stool softner, as well as prn laxatives Regular diet per surgery GI referral outpatient for constipaton   Acute Hypoxic Respiratory Failure Now on RA, suspect degree of SOB related to abdominal  distension CXR 11/2 without acute disease Continue flutter valve SLP eval - lots of transmitted upper airway sounds Resolved   Atrial Fibrillation Continue metoprolol, amiodarone and eliqus RVR resolved with resumption of metoprolol  AKI Resolved, follow with IVF   Anxiety Hydroxyzine  Chronic Pain Tramadol   Parkinson's Sinemet   COPD with exacerbation Transmitted upper airway sounds yesterday -> improved  Continue albuterol, trelegy, and duonebs Hold off of steroids for now Seems improved   T2DM Hold metformin Continue SSI   Iron Def Anemia Stop iron   Hypertension Continue metoprolol   Lower Extremity Edema of RLE Chronic, follow [place] without DVT, negative BNP - follow outpatient  Thrombocytopenia Follow outpatient  Procedures: none  Consultations: surgery  Discharge Exam: Vitals:   02/05/21 0417 02/05/21 0755  BP: 99/67 105/74  Pulse: 77 67  Resp:  20  Temp:  98 F (36.7 C)  SpO2:  100%   No complaints today Thinks he needs to be cleaned up before he goes Called sister  General: No acute distress. Cardiovascular: RRR Lungs: unlabored Abdomen: distended, nontender Neurological: Alert and oriented 3. Moves all extremities 4. Cranial nerves II through XII grossly intact. Skin: Warm and dry. No rashes or lesions. Extremities: No clubbing or cyanosis. No edema  Discharge Instructions   Discharge Instructions     Ambulatory referral to Gastroenterology   Complete by: As directed    Call MD for:  difficulty breathing, headache or visual disturbances   Complete by: As directed    Call MD for:  extreme fatigue   Complete by: As directed    Call MD for:  hives   Complete by: As directed    Call MD for:  persistant dizziness or light-headedness   Complete by: As directed    Call MD for:  persistant nausea and vomiting   Complete by: As directed    Call MD for:  redness, tenderness, or signs of infection (pain, swelling, redness, odor  or green/yellow discharge around incision site)   Complete by: As directed    Call MD for:  severe uncontrolled pain   Complete by: As directed    Call MD for:  temperature >100.4   Complete by: As directed    Diet - low sodium heart healthy   Complete by: As directed    Discharge instructions   Complete by: As directed    You were seen due to abdominal distension due to severe constipation.  You've improved with enemas.  Continue your daily enema as well as your stool softener, senna, and fiber.  I'll refer you to [facility] gastroenterologist for further management of your severe constipation.  Stop your iron as your blood counts are good and this can contribute to constipation.  Return for new, recurrent, or worsening symptoms.  Please ask your PCP to request records from this hospitalization so they know what was done and what the next steps will be.   Increase activity slowly   Complete by: As directed       Allergies as of 02/05/2021   No Known Allergies      Medication List     STOP taking these medications    ferrous sulfate 325 (65 FE) MG tablet       TAKE these medications    acetaminophen 325 MG tablet Commonly known as: TYLENOL Take 650 mg by mouth every 4 (four) hours as needed.   Albuterol Sulfate 108 (90 Base) MCG/ACT Aepb Commonly known as: PROAIR RESPICLICK Inhale 2 puffs into the lungs every 4 (four) hours as needed (COPD).   amiodarone 400 MG tablet Commonly known as: PACERONE Take 1 tablet (400 mg total) by mouth daily.   Anti-Dandruff 1 % Lotn Generic drug: selenium sulfide Apply topically.   apixaban 5 MG Tabs tablet Commonly known as: ELIQUIS Take 5 mg by mouth 2 (two) times daily.   atorvastatin 20 MG tablet Commonly known as: LIPITOR Take 20 mg by mouth daily.   Biofreeze 4 % Gel Generic drug: Menthol (Topical Analgesic) Apply 1 application topically 3 (three) times daily.   bisacodyl 10 MG suppository Commonly known as:  DULCOLAX Place 10 mg rectally as needed for moderate constipation.   calcium carbonate 500 MG chewable tablet Commonly known as: TUMS - dosed in mg elemental calcium Chew 2 tablets by mouth every 6 (six) hours as needed for indigestion or heartburn.   carbidopa-levodopa 25-100 MG tablet Commonly known as: SINEMET IR Take 1 tablet by mouth 3 (three) times daily before meals. At 0630, 1130, 1630   cetaphil cream Apply 1 application topically as needed.   escitalopram 5 MG tablet Commonly known as: LEXAPRO Take 5 mg by mouth daily.   feeding supplement (GLUCERNA SHAKE) Liqd Take 237 mLs by mouth 2 (two) times daily between meals.   finasteride 5 MG tablet Commonly known as: PROSCAR Take 5 mg by mouth daily.   fluticasone 50 MCG/ACT nasal spray Commonly known as: FLONASE Place 1 spray into both nostrils daily.   gabapentin 800 MG tablet Commonly known as: NEURONTIN Take 800 mg by mouth 2 (two) times daily.   guaiFENesin 600 MG 12 hr tablet Commonly known as: MUCINEX Take 1 tablet (600 mg total) by mouth  2 (two) times daily.   hydrOXYzine 25 MG tablet Commonly known as: ATARAX/VISTARIL Take 25 mg by mouth 2 (two) times daily as needed for itching.   ipratropium 0.03 % nasal spray Commonly known as: ATROVENT Place 2 sprays into both nostrils 2 (two) times daily.   ipratropium-albuterol 0.5-2.5 (3) MG/3ML Soln Commonly known as: DUONEB Inhale 3 mLs into the lungs daily. And every 6 hours as needed for shortness of breath or wheezing   lactase 3000 units tablet Commonly known as: LACTAID Take 3,000 Units by mouth 3 (three) times daily with meals.   metFORMIN 500 MG 24 hr tablet Commonly known as: GLUCOPHAGE-XR Take 500 mg by mouth daily.   methocarbamol 500 MG tablet Commonly known as: ROBAXIN Take 1,000 mg by mouth in the morning and at bedtime.   metoprolol tartrate 100 MG tablet Commonly known as: LOPRESSOR Take 1 tablet (100 mg total) by mouth 2 (two) times  daily.   mineral oil enema Place 133 mLs (1 enema total) rectally daily.   MINERIN CREME EX Apply topically. Apply to feet twice bid   oxybutynin 10 MG 24 hr tablet Commonly known as: DITROPAN-XL Take 1 tablet (10 mg total) by mouth daily.   pantoprazole 40 MG tablet Commonly known as: PROTONIX Take 1 tablet (40 mg total) by mouth 2 (two) times daily before Tamber Burtch meal. For 8 weeks.   polyethylene glycol 17 g packet Commonly known as: MIRALAX / GLYCOLAX Take 17 g by mouth daily.   psyllium 58.6 % powder Commonly known as: METAMUCIL Take 1 packet by mouth daily.   Sarna lotion Generic drug: camphor-menthol Apply topically.   senna 8.6 MG Tabs tablet Commonly known as: SENOKOT Take 2 tablets by mouth at bedtime.   simethicone 125 MG chewable tablet Commonly known as: MYLICON Chew 0000000 mg by mouth 3 (three) times daily.   STOOL SOFTENER LAXATIVE PO Take 2 tablets by mouth at bedtime.   torsemide 10 MG tablet Commonly known as: DEMADEX Take 10 mg by mouth daily.   traZODone 100 MG tablet Commonly known as: DESYREL Take 100 mg by mouth at bedtime.   Trelegy Ellipta 100-62.5-25 MCG/ACT Aepb Generic drug: Fluticasone-Umeclidin-Vilant Inhale 1 puff into the lungs daily.   triamcinolone cream 0.1 % Commonly known as: KENALOG Apply 1 application topically 2 (two) times daily.       No Known Allergies    The results of significant diagnostics from this hospitalization (including imaging, microbiology, ancillary and laboratory) are listed below for reference.    Significant Diagnostic Studies: CT ABDOMEN PELVIS WO CONTRAST  Result Date: 01/10/2021 CLINICAL DATA:  Nausea, vomiting, upper abdominal pain EXAM: CT ABDOMEN AND PELVIS WITHOUT CONTRAST TECHNIQUE: Multidetector CT imaging of the abdomen and pelvis was performed following the standard protocol without IV contrast. COMPARISON:  CT abdomen/pelvis 10/23/2020 FINDINGS: Lower chest: There is Gracelynn Bircher small left pleural  effusion. There is dependent subsegmental atelectasis in both lung bases. Mitral annular calcifications, aortic valve calcifications, and coronary artery calcifications are seen. There is no pericardial effusion. Hepatobiliary: Kenzey Birkland hypodense lesion in the right hepatic lobe near the dome is unchanged, likely Angello Chien cyst. There are no other focal lesions, within the confines of noncontrast technique. There is cholelithiasis without evidence of acute cholecystitis. There is no biliary ductal dilatation. Pancreas: Unremarkable. Spleen: Unremarkable. Adrenals/Urinary Tract: The adrenals are unremarkable. Bilateral renal parenchymal atrophy is again seen. Juleen Sorrels 2.2 cm hypodense lesion adjacent to the posterior left renal cortex is unchanged, likely an exophytic cyst. Additional subcentimeter  hypodense lesions in the kidneys are too small to characterize but likely reflect additional small cysts. There is Lenis Nettleton punctate nonobstructing left lower pole stone, unchanged. There are no other calculi. There is no hydronephrosis or hydroureter. The bladder is decompressed by Davie Claud suprapubic catheter and not well assessed. Stomach/Bowel: There is an enteric catheter in place with the tip in the stomach. The stomach is otherwise unremarkable. There is no evidence of bowel obstruction. There is Moustafa Mossa moderate stool burden in the rectum, similar to the prior study. There is no finding to suggest stercoral colitis. There is colonic diverticulosis without evidence of acute diverticulitis. Vascular/Lymphatic: There is scattered calcified atherosclerotic plaque in the nonaneurysmal abdominal aorta. There is no portal venous gas. There is no abdominal or pelvic lymphadenopathy. Reproductive: The prostate and seminal vesicles are not well assessed. Other: There is no ascites or free air. Musculoskeletal: Bilateral hip arthroplasty hardware is noted. The hardware is grossly within expected limits to the level imaged. There is advanced multilevel degenerative  change of the lumbar spine with fusion of the L4 and L5 vertebral bodies, vacuum disc phenomenon at L2-L3, L3-L4, and L5-S1, and flowing anterior osteophytes in the lower thoracic spine. There are postsurgical changes reflecting decompressive laminectomies at L3 through L5. The findings are overall unchanged. There is no acute osseous abnormality or aggressive osseous lesion. IMPRESSION: 1. No acute findings in the abdomen or pelvis. 2. Cholelithiasis without evidence of acute cholecystitis. 3. Diverticulosis without evidence of acute diverticulitis. 4. Moderate stool burden in the rectum without evidence of stercoral colitis. 5. Unchanged punctate nonobstructing left lower pole renal stone. Aortic Atherosclerosis (ICD10-I70.0). Electronically Signed   By: Lesia Hausen M.D.   On: 01/10/2021 16:09   DG Chest 1 View  Result Date: 01/29/2021 CLINICAL DATA:  Shortness of breath EXAM: CHEST  1 VIEW COMPARISON:  01/12/2021 FINDINGS: Atherosclerotic calcification of the aortic arch. Mitral valve clips noted. Moderate cardiomegaly. Indistinctness of the pulmonary vasculature compatible with pulmonary venous hypertension, with some mild hazy new suprahilar density on the left could reflect some minimal confluent edema or pneumonia. Low lung volumes are present. No blunting of the costophrenic angles. Stable substantial postoperative hardware in the cervical spine region. IMPRESSION: 1. Moderate enlargement of the cardiopericardial silhouette with pulmonary venous hypertension. Hazy left suprahilar density is increased from prior and could represent some mild asymmetric edema or early pneumonia. 2.  Aortic Atherosclerosis (ICD10-I70.0). 3. Mitral valve clips noted. 4. Low lung volumes. Electronically Signed   By: Gaylyn Rong M.D.   On: 01/29/2021 18:12   DG Abd 1 View  Result Date: 01/31/2021 CLINICAL DATA:  Abdominal distension EXAM: ABDOMEN - 1 VIEW COMPARISON:  CT abdomen and pelvis 01/30/2021 FINDINGS:  There is diffuse gaseous distension of bowel in the abdomen, with rectal stool ball present, and no definable transition point. Bilateral hip arthroplasties. IMPRESSION: Diffuse gaseous distention of bowel in the abdomen, with large stool burden in the rectum. Electronically Signed   By: Caprice Renshaw M.D.   On: 01/31/2021 17:00   DG Abdomen 1 View  Result Date: 01/10/2021 CLINICAL DATA:  NG tube placement EXAM: ABDOMEN - 1 VIEW COMPARISON:  KUB obtained earlier the same day FINDINGS: The enteric catheter tip projects at the level of the GE junction. The side hole projects over the distal esophagus. Gaseous distention of the stomach and bowel in the imaged upper abdomen is not significantly changed. IMPRESSION: The enteric catheter tip projects at the level of the GE junction, and the side hole  projects over the distal esophagus. Recommend advancement by approximately 7 cm. These results will be called to the ordering clinician or representative by the Radiologist Assistant, and communication documented in the PACS or [employer]. Electronically Signed   By: Valetta Mole M.D.   On: 01/10/2021 12:23   DG Abd 1 View  Result Date: 01/10/2021 CLINICAL DATA:  Abdominal pain EXAM: ABDOMEN - 1 VIEW COMPARISON:  None. FINDINGS: There is marked gaseous distension of the stomach. There is milder distension of the small bowel and colon. No acute osseous abnormality. Bilateral hip arthroplasties. IMPRESSION: Marked gaseous distension of the stomach and milder distension of the small bowel and colon. Patient may benefit from Srijan Givan nasogastric tube. Electronically Signed   By: Maurine Simmering M.D.   On: 01/10/2021 10:32   CT Angio Chest PE W and/or Wo Contrast  Result Date: 01/09/2021 CLINICAL DATA:  Wheezing and congestion. Atrial fibrillation. Hypoxia. Hypotension. COPD. EXAM: CT ANGIOGRAPHY CHEST WITH CONTRAST TECHNIQUE: Multidetector CT imaging of the chest was performed using the standard protocol during bolus  administration of intravenous contrast. Multiplanar CT image reconstructions and MIPs were obtained to evaluate the vascular anatomy. CONTRAST:  82mL OMNIPAQUE IOHEXOL 350 MG/ML SOLN COMPARISON:  Multiple exams, including CT chest 10/16/2020 FINDINGS: Cardiovascular: Marginal irregularities simulating chronic pulmonary embolus in the left upper lobe pulmonary artery on images 74 through 100 of series 6 correlate with substantial motion artifact and accordingly are likely artifactual in nature. No definite filling defect characteristic for acute pulmonary embolus is identified in the pulmonary arterial tree. Moderate cardiomegaly. Coronary, aortic arch, and branch vessel atherosclerotic vascular disease. Mitral valve clips noted. Mediastinum/Nodes: Prominence of adipose tissues in the upper mediastinum. Suspected fluid in the superior pericardial recess. 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 Dependent atelectasis in both lower lobes. Mild dependent atelectasis in the lingula. Trace left pleural effusion. Upper Abdomen: Stable hypodense 2.0 by 1.6 cm lesion in the right hepatic lobe on image 71 series 5. Dependent densities in the gallbladder suggesting cholelithiasis. Musculoskeletal: Prominent bilateral rotator cuff muscular atrophy. Substantial bilateral degenerative glenohumeral arthropathy with calcifications in the joint favoring chondrocalcinosis. Substantial bilateral sternoclavicular joint arthropathy. Scattered mild intramedullary sclerosis along some ribs, probably incidental. Old deformity of the left medial eleventh rib from an old healed fracture. Thoracic spondylosis. Lower cervical plate and screw fixator. Prominent multilevel thoracic degenerative disc disease multilevel impingement due to spurring. Review of the MIP images confirms the above findings. IMPRESSION: 1. No filling defect is identified in the pulmonary arterial tree  to suggest pulmonary embolus. Reduced sensitivity T2 the degree of motion artifact. 2. 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 3. Mild atelectasis in both lower lobes and in the lingula. 4. Trace left pleural effusion. 5. Cardiomegaly with coronary and aortic atherosclerosis. Mitral valve clips noted. Aortic Atherosclerosis (ICD10-I70.0). 6. Substantial bilateral glenohumeral arthropathy. Prominent bilateral rotator cuff muscular atrophy. 7. Multilevel thoracic spine impingement due to spurring. Electronically Signed   By: Van Clines M.D.   On: 01/09/2021 18:38   CT ABDOMEN PELVIS W CONTRAST  Result Date: 01/30/2021 CLINICAL DATA:  Abdominal distension EXAM: CT ABDOMEN AND PELVIS WITH CONTRAST TECHNIQUE: Multidetector CT imaging of the abdomen and pelvis was performed using the standard protocol following bolus administration of intravenous contrast. CONTRAST:  164mL OMNIPAQUE IOHEXOL 300 MG/ML  SOLN COMPARISON:  Earlier today FINDINGS: Lower chest: Bibasilar scarring or atelectasis. Heart is upper limits normal in size. Mitral valve and coronary artery calcifications again  noted. Hepatobiliary: Layering stones within the gallbladder, stable. Stable small cyst in the posterior right hepatic lobe. No biliary ductal dilatation. Pancreas: No focal abnormality or ductal dilatation. Spleen: No focal abnormality.  Normal size. Adrenals/Urinary Tract: Bilateral cortical thinning. Small exophytic cyst off the midpole of left kidney, stable. No stones or hydronephrosis. Adrenal glands unremarkable. Suprapubic catheter in the bladder which is decompressed. Stomach/Bowel: Large stool burden throughout the colon, most pronounced in the rectum. Cannot exclude fecal impaction. No evidence of bowel obstruction. Vascular/Lymphatic: Aortic atherosclerosis. No evidence of aneurysm or adenopathy. Reproductive: No visible focal abnormality. Other: No free fluid or free air.  Musculoskeletal: Bilateral hip replacements. Degenerative changes in the lumbar spine. No acute bony abnormality. IMPRESSION: Large stool burden in the rectum and throughout the colon. Cannot exclude fecal impaction. Cholelithiasis. Aortic atherosclerosis. Bibasilar scarring or atelectasis. No change since earlier today. Electronically Signed   By: Rolm Baptise M.D.   On: 01/30/2021 20:32   CT Abdomen Pelvis W Contrast  Result Date: 01/29/2021 CLINICAL DATA:  Bowel obstruction suspected EXAM: CT ABDOMEN AND PELVIS WITH CONTRAST TECHNIQUE: Multidetector CT imaging of the abdomen and pelvis was performed using the standard protocol following bolus administration of intravenous contrast. CONTRAST:  161mL OMNIPAQUE IOHEXOL 300 MG/ML  SOLN COMPARISON:  CT abdomen pelvis mental status Q FINDINGS: Lower chest: No acute abnormality. Mitral annular calcifications. Coronary artery calcifications. Tiny hiatal hernia. Hepatobiliary: There is Davonta Stroot 1.8 x 1.3 cm fluid density lesion within the right hepatic lobe likely represents Felissa Blouch simple hepatic cyst. No focal liver abnormality. Layering hyperdensity within the gallbladder lumen. Otherwise no gallbladder wall thickening or pericholecystic fluid. No biliary dilatation. Pancreas: No focal lesion. Normal pancreatic contour. No surrounding inflammatory changes. No main pancreatic ductal dilatation. Spleen: Normal in size without focal abnormality. Adrenals/Urinary Tract: No adrenal nodule bilaterally. Bilateral kidneys enhance symmetrically. 2.2 cm fluid density lesion within left kidney likely represents simple renal cysts. Subcentimeter hypodensities are too small to characterize. No hydronephrosis. No hydroureter.  Punctate left nephrolithiasis. Suprapubic catheter with tip and balloon terminate within the urinary bladder lumen. Associated foci of gas within the lumen of the urinary bladder. Stomach/Bowel: Stomach is within normal limits. No evidence of bowel wall thickening or  dilatation. Stool throughout the ascending colon. Stool within the rectum. Few scattered colonic diverticula. The appendix not definitely identified. Vascular/Lymphatic: No abdominal aorta or iliac aneurysm. Mild atherosclerotic plaque of the aorta and its branches. No abdominal, pelvic, or inguinal lymphadenopathy. Reproductive: Prostate is unremarkable. Other: No intraperitoneal free fluid. No intraperitoneal free gas. No organized fluid collection. Musculoskeletal: No abdominal wall hernia or abnormality. No suspicious lytic or blastic osseous lesions. No acute displaced fracture. Multilevel severe degenerative changes of the spine with associated multilevel severe osseous neural foraminal stenosis. Partially visualized bilateral total hip arthroplasties. IMPRESSION: 1. Cholelithiasis no findings of acute cholecystitis. 2. Few scattered colonic diverticula with no acute diverticulitis. 3. Stool within the rectum with no associated stercoral colitis. 4. Nonobstructive punctate left nephrolithiasis 5. Aortic Atherosclerosis (ICD10-I70.0) as well as mitral annular and coronary calcifications. Electronically Signed   By: Iven Finn M.D.   On: 01/29/2021 20:03   US Venous Img Lower Bilateral (DVT)  Result Date: 02/03/2021 CLINICAL DATA:  Bilateral lower extremity edema, right greater than left. EXAM: BILATERAL LOWER EXTREMITY VENOUS DOPPLER ULTRASOUND TECHNIQUE: Gray-scale sonography with graded compression, as well as color Doppler and duplex ultrasound were performed to evaluate the lower extremity deep venous systems from the level of the common femoral vein and including  the common femoral, femoral, profunda femoral, popliteal and calf veins including the posterior tibial, peroneal and gastrocnemius veins when visible. The superficial great saphenous vein was also interrogated. Spectral Doppler was utilized to evaluate flow at rest and with distal augmentation maneuvers in the common femoral, femoral and  popliteal veins. COMPARISON:  None. FINDINGS: RIGHT LOWER EXTREMITY Common Femoral Vein: No evidence of thrombus. Normal compressibility, respiratory phasicity and response to augmentation. Saphenofemoral Junction: No evidence of thrombus. Normal compressibility and flow on color Doppler imaging. Profunda Femoral Vein: No evidence of thrombus. Normal compressibility and flow on color Doppler imaging. Femoral Vein: No evidence of thrombus. Normal compressibility, respiratory phasicity and response to augmentation. Popliteal Vein: No evidence of thrombus. Normal compressibility, respiratory phasicity and response to augmentation. Calf Veins: No evidence of thrombus. Normal compressibility and flow on color Doppler imaging. Superficial Great Saphenous Vein: No evidence of thrombus. Normal compressibility. Venous Reflux:  None. Other Findings: No evidence of superficial thrombophlebitis or abnormal fluid collection. LEFT LOWER EXTREMITY Common Femoral Vein: No evidence of thrombus. Normal compressibility, respiratory phasicity and response to augmentation. Saphenofemoral Junction: No evidence of thrombus. Normal compressibility and flow on color Doppler imaging. Profunda Femoral Vein: No evidence of thrombus. Normal compressibility and flow on color Doppler imaging. Femoral Vein: No evidence of thrombus. Normal compressibility, respiratory phasicity and response to augmentation. Popliteal Vein: No evidence of thrombus. Normal compressibility, respiratory phasicity and response to augmentation. Calf Veins: No evidence of thrombus. Normal compressibility and flow on color Doppler imaging. Superficial Great Saphenous Vein: No evidence of thrombus. Normal compressibility. Venous Reflux:  None. Other Findings: No evidence of superficial thrombophlebitis or abnormal fluid collection. IMPRESSION: No evidence of deep venous thrombosis in either lower extremity. Electronically Signed   By: Aletta Edouard M.D.   On: 02/03/2021  07:53   US Venous Img Lower Bilateral (DVT)  Result Date: 01/10/2021 CLINICAL DATA:  Lower extremity edema EXAM: BILATERAL LOWER EXTREMITY VENOUS DOPPLER ULTRASOUND TECHNIQUE: Gray-scale sonography with compression, as well as color and duplex ultrasound, were performed to evaluate the deep venous system(s) from the level of the common femoral vein through the popliteal and proximal calf veins. COMPARISON:  None. FINDINGS: VENOUS Normal compressibility of the common femoral, superficial femoral, and popliteal veins, as well as the visualized calf veins. Visualized portions of profunda femoral vein and great saphenous vein unremarkable. No filling defects to suggest DVT on grayscale or color Doppler imaging. Doppler waveforms show normal direction of venous flow, normal respiratory plasticity and response to augmentation. Limited views of the contralateral common femoral vein are unremarkable. OTHER None. Limitations: none IMPRESSION: Negative. Electronically Signed   By: Ulyses Jarred M.D.   On: 01/10/2021 02:12   DG Chest Port 1 View  Result Date: 01/31/2021 CLINICAL DATA:  Abdominal pain.  COPD. EXAM: PORTABLE CHEST 1 VIEW COMPARISON:  01/29/2021 and prior exams. FINDINGS: Cardiac silhouette is mildly enlarged. No mediastinal or hilar masses. Low lung volumes.  Allowing for this, lungs are clear. No convincing pleural effusion or pneumothorax. Skeletal structures are grossly intact. IMPRESSION: No acute cardiopulmonary disease and no significant change from the most recent prior study. Electronically Signed   By: Lajean Manes M.D.   On: 01/31/2021 10:40   DG Chest Port 1 View  Result Date: 01/12/2021 CLINICAL DATA:  Cough.  COPD.  Diabetes. EXAM: PORTABLE CHEST 1 VIEW COMPARISON:  01/09/2021 FINDINGS: Cervical spine fixation. Apical lordotic positioning. Patient rotated left. Cardiomegaly accentuated by AP portable technique. Atherosclerosis in the transverse aorta. No pleural  effusion or  pneumothorax. Low lung volumes with resultant pulmonary interstitial prominence. No overt congestive failure. Numerous leads and wires project over the chest. No lobar consolidation. IMPRESSION: Cardiomegaly and low lung volumes.  No acute findings. Aortic Atherosclerosis (ICD10-I70.0). Electronically Signed   By: Abigail Miyamoto M.D.   On: 01/12/2021 16:31   DG Chest Portable 1 View  Result Date: 01/09/2021 CLINICAL DATA:  Shortness of breath, wheezing, congestion EXAM: PORTABLE CHEST 1 VIEW COMPARISON:  Chest radiograph 12/04/2020 FINDINGS: The heart appears mildly enlarged, though this is likely exaggerated by AP technique and low lung volumes. The mediastinal contours are stable. There is calcified atherosclerotic plaque of the aortic arch. Lung volumes are low, with unchanged asymmetric elevation of the right hemidiaphragm. Linear opacities in the lateral left base likely reflect atelectasis or scar. There is no other focal consolidation. There is no pulmonary edema. There is no definite pneumothorax, though the apices are suboptimally evaluated. There is no acute osseous abnormality. IMPRESSION: Low lung volumes. Otherwise, no radiographic evidence of acute cardiopulmonary process. Electronically Signed   By: Valetta Mole M.D.   On: 01/09/2021 16:24    Microbiology: Recent Results (from the past 240 hour(s))  Resp Panel by RT-PCR (Flu Vaness Jelinski&B, Covid) Nasopharyngeal Swab     Status: None   Collection Time: 01/30/21 12:21 AM   Specimen: Nasopharyngeal Swab; Nasopharyngeal(NP) swabs in vial transport medium  Result Value Ref Range Status   SARS Coronavirus 2 by RT PCR NEGATIVE NEGATIVE Final    Comment: (NOTE) SARS-CoV-2 target nucleic acids are NOT DETECTED.  The SARS-CoV-2 RNA is generally detectable in upper respiratory specimens during the acute phase of infection. The lowest concentration of SARS-CoV-2 viral copies this assay can detect is 138 copies/mL. Oralee Rapaport negative result does not preclude  SARS-Cov-2 infection and should not be used as the sole basis for treatment or other patient management decisions. Jaydalee Bardwell negative result may occur with  improper specimen collection/handling, submission of specimen other than nasopharyngeal swab, presence of viral mutation(s) within the areas targeted by this assay, and inadequate number of viral copies(<138 copies/mL). Ezri Fanguy negative result must be combined with clinical observations, patient history, and epidemiological information. The expected result is Negative.  Fact Sheet for Patients:  EntrepreneurPulse.com.au  Fact Sheet for Healthcare Providers:  IncredibleEmployment.be  This test is no t yet approved or cleared by the [place] FDA and  has been authorized for detection and/or diagnosis of SARS-CoV-2 by FDA under an Emergency Use Authorization (EUA). This EUA will remain  in effect (meaning this test can be used) for the duration of the COVID-19 declaration under Section 564(b)(1) of the Act, 21 U.S.C.section 360bbb-3(b)(1), unless the authorization is terminated  or revoked sooner.       Influenza Kimberleigh Mehan by PCR NEGATIVE NEGATIVE Final   Influenza B by PCR NEGATIVE NEGATIVE Final    Comment: (NOTE) The Xpert Xpress SARS-CoV-2/FLU/RSV plus assay is intended as an aid in the diagnosis of influenza from Nasopharyngeal swab specimens and should not be used as Vela Render sole basis for treatment. Nasal washings and aspirates are unacceptable for Xpert Xpress SARS-CoV-2/FLU/RSV testing.  Fact Sheet for Patients: EntrepreneurPulse.com.au  Fact Sheet for Healthcare Providers: IncredibleEmployment.be  This test is not yet approved or cleared by the [place] FDA and has been authorized for detection and/or diagnosis of SARS-CoV-2 by FDA under an Emergency Use Authorization (EUA). This EUA will remain in effect (meaning this test can be used) for the duration of  the COVID-19 declaration under Section  564(b)(1) of the Act, 21 U.S.C. section 360bbb-3(b)(1), unless the authorization is terminated or revoked.  Performed at [facility], [address] [place], [place] [address]      Labs: Basic Metabolic Panel: Recent Labs  Lab 01/31/21 0503 02/01/21 0439 02/02/21 0434 02/04/21 0456 02/05/21 0606  NA 138 138 142 136 141  K 3.4* 3.8 3.5 3.7 3.7  CL 103 104 108 106 109  CO2 27 27 27 26 26   GLUCOSE 121* 117* 111* 124* 110*  BUN 34* 23 15 13 16   CREATININE 1.00 0.82 0.88 0.78 0.81  CALCIUM 8.5* 8.5* 8.3* 7.8* 8.1*  MG 2.5* 2.2 2.0 1.8 1.7  PHOS  --  2.8 2.3* 2.2* 2.5   Liver Function Tests: Recent Labs  Lab 01/31/21 0503 02/01/21 0439 02/02/21 0434 02/04/21 0456 02/05/21 0606  AST 11* 10* 10* 13* 8*  ALT 18 5 13 15 13   ALKPHOS 56 53 47 59 52  BILITOT 1.1 1.2 1.1 1.0 0.7  PROT 5.5* 5.3* 4.7* 4.8* 4.8*  ALBUMIN 3.1* 3.0* 2.5* 2.2* 2.3*   Recent Labs  Lab 01/29/21 1758  LIPASE 30   No results for input(s): AMMONIA in the last 168 hours. CBC: Recent Labs  Lab 01/29/21 1758 01/30/21 1450 01/31/21 0503 02/01/21 0439 02/02/21 0434 02/04/21 0456 02/05/21 0606  WBC 15.8*   < > 9.2 7.8 7.3 6.2 4.7  NEUTROABS 13.1*  --   --  6.2 6.2 4.4 3.0  HGB 16.6   < > 14.0 13.2 11.8* 11.9* 12.1*  HCT 47.0   < > 40.0 40.1 36.4* 36.6* 38.0*  MCV 92.7   < > 93.5 95.9 97.3 96.8 97.2  PLT 287   < > 174 162 143* 120* 122*   < > = values in this interval not displayed.   Cardiac Enzymes: No results for input(s): CKTOTAL, CKMB, CKMBINDEX, TROPONINI in the last 168 hours. BNP: BNP (last 3 results) Recent Labs    01/09/21 1547 01/29/21 1758 02/02/21 0434  BNP 263.7* 232.9* 56.3    ProBNP (last 3 results) No results for input(s): PROBNP in the last 8760 hours.  CBG: Recent Labs  Lab 02/04/21 1638 02/04/21 1948 02/04/21 2353 02/05/21 0414 02/05/21 0749  GLUCAP 148* 150* 121* 111* 111*        Signed:  Fayrene Helper MD.  [facility] 02/05/2021, 12:01 PM

## 2021-03-05 ENCOUNTER — Institutional Professional Consult (permissible substitution): Payer: No Typology Code available for payment source | Admitting: Internal Medicine

## 2021-03-05 LAB — BLOOD GAS, VENOUS
Acid-Base Excess: 4 mmol/L — ABNORMAL HIGH (ref 0.0–2.0)
Bicarbonate: 30.2 mmol/L — ABNORMAL HIGH (ref 20.0–28.0)
O2 Saturation: 55.8 %
Patient temperature: 37
pCO2, Ven: 51 mmHg (ref 44.0–60.0)
pH, Ven: 7.38 (ref 7.250–7.430)

## 2021-03-05 NOTE — Progress Notes (Deleted)
Raymond Hays, male    DOB: 1945/06/30, 75 y.o.   MRN: [id number]   Brief patient profile:  ***  yo***  *** referred to pulmonary clinic in [facility]  03/05/2021 by Dr Marland Kitchen  for ***          History of Present Illness  03/05/2021  Pulmonary/ 1st office eval/ Kilo Eshelman / [employer]  No chief complaint on file.    Dyspnea:  *** Cough: *** Sleep: *** SABA use:   Past Medical History:  Diagnosis Date   Allergic rhinitis    Anxiety    Arthritis    Chronic indwelling Foley catheter    COPD (chronic obstructive pulmonary disease) (HCC)    Cough    Diabetes (HCC)    Essential (primary) hypertension    Hemorrhoid    Hyperlipidemia    PAF (paroxysmal atrial fibrillation) (HCC)    Primary osteoarthritis, unspecified shoulder    Retention of urine, unspecified    Spinal stenosis    UTI (urinary tract infection)     Outpatient Medications Prior to Visit  Medication Sig Dispense Refill   acetaminophen (TYLENOL) 325 MG tablet Take 650 mg by mouth every 4 (four) hours as needed.     Albuterol Sulfate (PROAIR RESPICLICK) 108 (90 Base) MCG/ACT AEPB Inhale 2 puffs into the lungs every 4 (four) hours as needed (COPD).     amiodarone (PACERONE) 400 MG tablet Take 1 tablet (400 mg total) by mouth daily.     ANTI-DANDRUFF 1 % SHAM Apply topically.     apixaban (ELIQUIS) 5 MG TABS tablet Take 5 mg by mouth 2 (two) times daily.      atorvastatin (LIPITOR) 20 MG tablet Take 20 mg by mouth daily.     BIOFREEZE 4 % GEL Apply 1 application topically 3 (three) times daily.     bisacodyl (DULCOLAX) 10 MG suppository Place 10 mg rectally as needed for moderate constipation.     calcium carbonate (TUMS - DOSED IN MG ELEMENTAL CALCIUM) 500 MG chewable tablet Chew 2 tablets by mouth every 6 (six) hours as needed for indigestion or heartburn.     carbidopa-levodopa (SINEMET IR) 25-100 MG tablet Take 1 tablet by mouth 3 (three) times daily before meals. At 0630, 1130, 1630     Docusate Sodium (STOOL  SOFTENER LAXATIVE PO) Take 2 tablets by mouth at bedtime. (Patient not taking: Reported on 01/30/2021)     Emollient (CETAPHIL) cream Apply 1 application topically as needed.     escitalopram (LEXAPRO) 5 MG tablet Take 5 mg by mouth daily.     feeding supplement, GLUCERNA SHAKE, (GLUCERNA SHAKE) LIQD Take 237 mLs by mouth 2 (two) times daily between meals.  0   finasteride (PROSCAR) 5 MG tablet Take 5 mg by mouth daily.     fluticasone (FLONASE) 50 MCG/ACT nasal spray Place 1 spray into both nostrils daily.     gabapentin (NEURONTIN) 800 MG tablet Take 800 mg by mouth 2 (two) times daily.      guaiFENesin (MUCINEX) 600 MG 12 hr tablet Take 1 tablet (600 mg total) by mouth 2 (two) times daily.     hydrOXYzine (ATARAX/VISTARIL) 25 MG tablet Take 25 mg by mouth 2 (two) times daily as needed for itching.     ipratropium (ATROVENT) 0.03 % nasal spray Place 2 sprays into both nostrils 2 (two) times daily.     ipratropium-albuterol (DUONEB) 0.5-2.5 (3) MG/3ML SOLN Inhale 3 mLs into the lungs daily. And every 6 hours as  needed for shortness of breath or wheezing     lactase (LACTAID) 3000 units tablet Take 3,000 Units by mouth 3 (three) times daily with meals.     metFORMIN (GLUCOPHAGE-XR) 500 MG 24 hr tablet Take 500 mg by mouth daily.      methocarbamol (ROBAXIN) 500 MG tablet Take 1,000 mg by mouth in the morning and at bedtime.      metoprolol tartrate (LOPRESSOR) 100 MG tablet Take 1 tablet (100 mg total) by mouth 2 (two) times daily.     mineral oil enema Place 133 mLs (1 enema total) rectally daily. 133 mL 0   oxybutynin (DITROPAN-XL) 10 MG 24 hr tablet Take 1 tablet (10 mg total) by mouth daily.     pantoprazole (PROTONIX) 40 MG tablet Take 1 tablet (40 mg total) by mouth 2 (two) times daily before a meal. For 8 weeks.  2   polyethylene glycol (MIRALAX / GLYCOLAX) 17 g packet Take 17 g by mouth daily.     psyllium (METAMUCIL) 58.6 % powder Take 1 packet by mouth daily.     SARNA lotion Apply  topically.     senna (SENOKOT) 8.6 MG TABS tablet Take 2 tablets by mouth at bedtime.     simethicone (MYLICON) 125 MG chewable tablet Chew 125 mg by mouth 3 (three) times daily.     Skin Protectants, Misc. (MINERIN CREME EX) Apply topically. Apply to feet twice bid     torsemide (DEMADEX) 10 MG tablet Take 10 mg by mouth daily.     traZODone (DESYREL) 100 MG tablet Take 100 mg by mouth at bedtime.     TRELEGY ELLIPTA 100-62.5-25 MCG/INH AEPB Inhale 1 puff into the lungs daily.     triamcinolone cream (KENALOG) 0.1 % Apply 1 application topically 2 (two) times daily.     No facility-administered medications prior to visit.     Objective:     There were no vitals taken for this visit.         Assessment   No problem-specific Assessment & Plan notes found for this encounter.     Sandrea Hughs, MD 03/05/2021

## 2021-03-11 ENCOUNTER — Other Ambulatory Visit: Payer: Self-pay

## 2021-03-11 ENCOUNTER — Emergency Department
Admission: EM | Admit: 2021-03-11 | Discharge: 2021-03-11 | Disposition: A | Discharge status: Home / Self Care | Payer: No Typology Code available for payment source | Attending: Emergency Medicine | Admitting: Emergency Medicine

## 2021-03-11 ENCOUNTER — Encounter: Payer: Self-pay | Admitting: Emergency Medicine

## 2021-03-11 DIAGNOSIS — Z87891 Personal history of nicotine dependence: Secondary | ICD-10-CM | POA: Insufficient documentation

## 2021-03-11 DIAGNOSIS — Z7984 Long term (current) use of oral hypoglycemic drugs: Secondary | ICD-10-CM | POA: Insufficient documentation

## 2021-03-11 DIAGNOSIS — Z96643 Presence of artificial hip joint, bilateral: Secondary | ICD-10-CM | POA: Insufficient documentation

## 2021-03-11 DIAGNOSIS — B0231 Zoster conjunctivitis: Secondary | ICD-10-CM | POA: Insufficient documentation

## 2021-03-11 DIAGNOSIS — J449 Chronic obstructive pulmonary disease, unspecified: Secondary | ICD-10-CM | POA: Diagnosis not present

## 2021-03-11 DIAGNOSIS — H5712 Ocular pain, left eye: Secondary | ICD-10-CM | POA: Diagnosis present

## 2021-03-11 DIAGNOSIS — Z8616 Personal history of COVID-19: Secondary | ICD-10-CM | POA: Diagnosis not present

## 2021-03-11 DIAGNOSIS — I10 Essential (primary) hypertension: Secondary | ICD-10-CM | POA: Diagnosis not present

## 2021-03-11 DIAGNOSIS — Z7901 Long term (current) use of anticoagulants: Secondary | ICD-10-CM | POA: Insufficient documentation

## 2021-03-11 DIAGNOSIS — Z79899 Other long term (current) drug therapy: Secondary | ICD-10-CM | POA: Diagnosis not present

## 2021-03-11 DIAGNOSIS — E1142 Type 2 diabetes mellitus with diabetic polyneuropathy: Secondary | ICD-10-CM | POA: Insufficient documentation

## 2021-03-11 LAB — BASIC METABOLIC PANEL
Anion gap: 9 (ref 5–15)
BUN: 32 mg/dL — ABNORMAL HIGH (ref 8–23)
CO2: 30 mmol/L (ref 22–32)
Calcium: 9 mg/dL (ref 8.9–10.3)
Chloride: 96 mmol/L — ABNORMAL LOW (ref 98–111)
Creatinine, Ser: 1.44 mg/dL — ABNORMAL HIGH (ref 0.61–1.24)
GFR, Estimated: 50 mL/min — ABNORMAL LOW (ref >60)
Glucose, Bld: 109 mg/dL — ABNORMAL HIGH (ref 70–99)
Potassium: 3.2 mmol/L — ABNORMAL LOW (ref 3.5–5.1)
Sodium: 135 mmol/L (ref 135–145)

## 2021-03-11 LAB — CBC
HCT: 44.3 % (ref 39.0–52.0)
Hemoglobin: 14.4 g/dL (ref 13.0–17.0)
MCH: 30.4 pg (ref 26.0–34.0)
MCHC: 32.5 g/dL (ref 30.0–36.0)
MCV: 93.7 fL (ref 80.0–100.0)
Platelets: 266 10*3/uL (ref 150–400)
RBC: 4.73 MIL/uL (ref 4.22–5.81)
RDW: 15.1 % (ref 11.5–15.5)
WBC: 11.2 10*3/uL — ABNORMAL HIGH (ref 4.0–10.5)
nRBC: 0.2 % (ref 0.0–0.2)

## 2021-03-11 MED ORDER — VALACYCLOVIR HCL 1 G PO TABS: 1000.0000 mg | ORAL_TABLET | Freq: Three times a day (TID) | ORAL | 0 refills | Status: DC

## 2021-03-11 MED ORDER — TETRACAINE HCL 0.5 % OP SOLN
1.0000 [drp] | Freq: Once | OPHTHALMIC | Status: AC
  Administered 2021-03-11: 1 [drp] via OPHTHALMIC
  Filled 2021-03-11: qty 4

## 2021-03-11 MED ORDER — FLUORESCEIN SODIUM 1 MG OP STRP
1.0000 | ORAL_STRIP | Freq: Once | OPHTHALMIC | Status: AC
  Administered 2021-03-11: 1 via OPHTHALMIC
  Filled 2021-03-11: qty 1

## 2021-03-11 MED ORDER — VALACYCLOVIR HCL 500 MG PO TABS
1000.0000 mg | ORAL_TABLET | Freq: Once | ORAL | Status: AC
Start: 2021-03-11 — End: 2021-03-11
  Administered 2021-03-11: 1000 mg via ORAL
  Filled 2021-03-11: qty 2

## 2021-03-11 NOTE — ED Triage Notes (Signed)
Pt via EMS from [facility]. 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 Pt states it been going on over a week. States that he has not been started on antibiotics. Pt is A&Ox4 and NAD.

## 2021-03-11 NOTE — ED Notes (Signed)
RN talked to Hilda Lias at [facility] [place]. RN notified her that pt was coming back with a paper script to get filled.

## 2021-03-11 NOTE — ED Provider Notes (Signed)
[facility] Emergency Department Provider Note   ____________________________________________   Event Date/Time   First MD Initiated Contact with Patient 03/11/21 1610     (approximate)  I have reviewed the triage vital signs and the nursing notes.   HISTORY  Chief Complaint Eye Pain    HPI Raymond Hays is a 75 y.o. male who presents for left eye pain  LOCATION: Left eye DURATION: 1 week prior to arrival TIMING: Worsening since onset SEVERITY: Severe QUALITY: Burning pain CONTEXT: Patient states that he has had left eye pain with surrounding erythema up over the scalp but does not cross midline and has been involving a rash over the past week.  Patient was seen previously and started on topical antibiotics for presumed MRSA infection MODIFYING FACTORS: Opening the eye partially worsens this pain and it has no relieving factors ASSOCIATED SYMPTOMS: Forehead and scalp rash   Per medical record review, patient is history of anxiety/depression, diabetes, paroxysmal atrial fibrillation          Past Medical History:  Diagnosis Date   Allergic rhinitis    Anxiety    Arthritis    Chronic indwelling Foley catheter    COPD (chronic obstructive pulmonary disease) (HCC)    Cough    Diabetes (HCC)    Essential (primary) hypertension    Hemorrhoid    Hyperlipidemia    PAF (paroxysmal atrial fibrillation) (HCC)    Primary osteoarthritis, unspecified shoulder    Retention of urine, unspecified    Spinal stenosis    UTI (urinary tract infection)     Patient Active Problem List   Diagnosis Date Noted   Obstipation 01/30/2021   Anxiety 01/30/2021   Iron deficiency anemia    Edema leg    Respiratory failure (HCC) 01/09/2021   Ileus (HCC)    Neck pain    AKI (acute kidney injury) (HCC)    Type 2 diabetes mellitus with diabetic neuropathy, without long-term current use of insulin (HCC)    Parkinson disease (HCC)    Sepsis (HCC) 10/16/2020    Chronic respiratory failure with hypoxia (HCC) 01/10/2020   Impaired functional mobility, balance, gait, and endurance 09/15/2019   Catheter cystitis (HCC) 10/16/2018   COVID-19 virus detected 07/03/2018   Benign prostatic hyperplasia with lower urinary tract symptoms 08/06/2017   Osteoarthritis of right knee 06/19/2017   S/P orthopedic surgery, follow-up exam 10/25/2016   Tendon rupture of wrist, sequela 01/02/2016   Acute pain of right knee 08/03/2015   Calcific tendinitis of left shoulder 07/05/2015   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/05/2015   Gait difficulty 06/26/2015   Severe mitral regurgitation 06/05/2015   AF (paroxysmal atrial fibrillation) (HCC) 05/31/2015   Chronic pain 05/31/2015   COPD with acute exacerbation (HCC) 05/31/2015   Essential hypertension 05/31/2015   Chronic pain of left wrist 04/24/2015   Arthritis of left wrist 04/24/2015   Rupture of extensor tendon of left hand 04/24/2015    Past Surgical History:  Procedure Laterality Date   APPENDECTOMY     BACK SURGERY     COLONOSCOPY N/A 10/27/2020   Procedure: COLONOSCOPY;  Surgeon: Wyline Mood, MD;  Location: [facility] ENDOSCOPY;  Service: Gastroenterology;  Laterality: N/A;   ELBOW SURGERY     ESOPHAGOGASTRODUODENOSCOPY (EGD) WITH PROPOFOL N/A 10/27/2020   Procedure: ESOPHAGOGASTRODUODENOSCOPY (EGD) WITH PROPOFOL;  Surgeon: Wyline Mood, MD;  Location: [facility] ENDOSCOPY;  Service: Gastroenterology;  Laterality: N/A;   HERNIA REPAIR     KNEE SURGERY  neck     PARTIAL HIP ARTHROPLASTY Bilateral    TEE WITHOUT CARDIOVERSION N/A 10/19/2020   Procedure: TRANSESOPHAGEAL ECHOCARDIOGRAM (TEE);  Surgeon: Antonieta Iba, MD;  Location: [facility] ORS;  Service: Cardiovascular;  Laterality: N/A;   TONSILLECTOMY      Prior to Admission medications   Medication Sig Start Date End Date Taking? Authorizing Provider  valACYclovir (VALTREX) 1000 MG tablet Take 1 tablet (1,000 mg total) by mouth 3 (three) times daily for 10  days. 03/11/21 03/21/21 Yes Merwyn Katos, MD  acetaminophen (TYLENOL) 325 MG tablet Take 650 mg by mouth every 4 (four) hours as needed.    [provider]  Albuterol Sulfate (PROAIR RESPICLICK) 108 (90 Base) MCG/ACT AEPB Inhale 2 puffs into the lungs every 4 (four) hours as needed (COPD).    [provider]  amiodarone (PACERONE) 400 MG tablet Take 1 tablet (400 mg total) by mouth daily. 01/23/21   Tresa Moore, MD  ANTI-DANDRUFF 1 % SHAM Apply topically. 02/18/20   [provider]  apixaban (ELIQUIS) 5 MG TABS tablet Take 5 mg by mouth 2 (two) times daily.  02/06/17   [provider]  atorvastatin (LIPITOR) 20 MG tablet Take 20 mg by mouth daily.    [provider]  BIOFREEZE 4 % GEL Apply 1 application topically 3 (three) times daily. 11/17/20   [provider]  bisacodyl (DULCOLAX) 10 MG suppository Place 10 mg rectally as needed for moderate constipation.    [provider]  calcium carbonate (TUMS - DOSED IN MG ELEMENTAL CALCIUM) 500 MG chewable tablet Chew 2 tablets by mouth every 6 (six) hours as needed for indigestion or heartburn.    [provider]  carbidopa-levodopa (SINEMET IR) 25-100 MG tablet Take 1 tablet by mouth 3 (three) times daily before meals. At 0630, 1130, 1630    [provider]  Docusate Sodium (STOOL SOFTENER LAXATIVE PO) Take 2 tablets by mouth at bedtime. Patient not taking: Reported on 01/30/2021    [provider]  Emollient (CETAPHIL) cream Apply 1 application topically as needed.    [provider]  escitalopram (LEXAPRO) 5 MG tablet Take 5 mg by mouth daily.    [provider]  feeding supplement, GLUCERNA SHAKE, (GLUCERNA SHAKE) LIQD Take 237 mLs by mouth 2 (two) times daily between meals. 11/01/20   [name], MD  finasteride (PROSCAR) 5 MG tablet Take 5 mg by mouth daily.    [provider]  fluticasone (FLONASE) 50 MCG/ACT nasal spray Place  1 spray into both nostrils daily.    [provider]  gabapentin (NEURONTIN) 800 MG tablet Take 800 mg by mouth 2 (two) times daily.     [provider]  guaiFENesin (MUCINEX) 600 MG 12 hr tablet Take 1 tablet (600 mg total) by mouth 2 (two) times daily. 01/22/21   Tresa Moore, MD  hydrOXYzine (ATARAX/VISTARIL) 25 MG tablet Take 25 mg by mouth 2 (two) times daily as needed for itching. 02/17/20   [provider]  ipratropium (ATROVENT) 0.03 % nasal spray Place 2 sprays into both nostrils 2 (two) times daily. 02/18/20   [provider]  ipratropium-albuterol (DUONEB) 0.5-2.5 (3) MG/3ML SOLN Inhale 3 mLs into the lungs daily. And every 6 hours as needed for shortness of breath or wheezing    [provider]  lactase (LACTAID) 3000 units tablet Take 3,000 Units by mouth 3 (three) times daily with meals.    [provider]  metFORMIN (  GLUCOPHAGE-XR) 500 MG 24 hr tablet Take 500 mg by mouth daily.     [provider]  methocarbamol (ROBAXIN) 500 MG tablet Take 1,000 mg by mouth in the morning and at bedtime.     [provider]  metoprolol tartrate (LOPRESSOR) 100 MG tablet Take 1 tablet (100 mg total) by mouth 2 (two) times daily. 01/22/21   Sreenath, Jonelle Sports, MD  mineral oil enema Place 133 mLs (1 enema total) rectally daily. 02/03/21   Zigmund Daniel., MD  oxybutynin (DITROPAN-XL) 10 MG 24 hr tablet Take 1 tablet (10 mg total) by mouth daily. 11/01/20   [name], MD  pantoprazole (PROTONIX) 40 MG tablet Take 1 tablet (40 mg total) by mouth 2 (two) times daily before a meal. For 8 weeks. 11/01/20 01/30/21  [name], MD  polyethylene glycol (MIRALAX / GLYCOLAX) 17 g packet Take 17 g by mouth daily.    [provider]  psyllium (METAMUCIL) 58.6 % powder Take 1 packet by mouth daily.    [provider]  SARNA lotion Apply topically. 02/25/20   [provider]  senna (SENOKOT) 8.6 MG TABS tablet  Take 2 tablets by mouth at bedtime.    [provider]  simethicone (MYLICON) 125 MG chewable tablet Chew 125 mg by mouth 3 (three) times daily.    [provider]  Skin Protectants, Misc. (MINERIN CREME EX) Apply topically. Apply to feet twice bid    [provider]  torsemide (DEMADEX) 10 MG tablet Take 10 mg by mouth daily.    [provider]  traZODone (DESYREL) 100 MG tablet Take 100 mg by mouth at bedtime.    [provider]  TRELEGY ELLIPTA 100-62.5-25 MCG/INH AEPB Inhale 1 puff into the lungs daily. 02/17/20   [provider]  triamcinolone cream (KENALOG) 0.1 % Apply 1 application topically 2 (two) times daily.    [provider]    Allergies Patient has no known allergies.  Family History  Problem Relation Age of Onset   Healthy Son    Healthy Son     Social History Social History   Tobacco Use   Smoking status: Former    Packs/day: 0.50    Years: 10.00    Pack years: 5.00    Types: Cigarettes    Quit date: 2006    Years since quitting: 16.9   Smokeless tobacco: Never  Vaping Use   Vaping Use: Never used  Substance Use Topics   Alcohol use: Not Currently   Drug use: Not Currently    Types: "Crack" cocaine    Comment: quit "crack" 15 years ago--11/04/2019    Review of Systems Constitutional: No fever/chills Eyes: Endorses left eye pain and blurry vision ENT: No sore throat. Cardiovascular: Denies chest pain. Respiratory: Denies shortness of breath. Gastrointestinal: No abdominal pain.  No nausea, no vomiting.  No diarrhea. Genitourinary: Negative for dysuria. Musculoskeletal: Negative for acute arthralgias Skin: Negative for rash. Neurological: Negative for headaches, weakness/numbness/paresthesias in any extremity Psychiatric: Negative for suicidal ideation/homicidal ideation   ____________________________________________   PHYSICAL EXAM:  VITAL SIGNS: ED Triage Vitals  Enc Vitals Group      BP 03/11/21 1318 102/81     Pulse Rate 03/11/21 1318 73     Resp 03/11/21 1318 16     Temp 03/11/21 1318 97.8 F (36.6 C)     Temp Source 03/11/21 1318 Oral     SpO2 03/11/21 1318 93 %     Weight  03/11/21 1330 160 lb (72.6 kg)     Height 03/11/21 1330 5\' 8"  (1.727 m)     Head Circumference --      Peak Flow --      Pain Score 03/11/21 1330 8     Pain Loc --      Pain Edu? --      Excl. in GC? --    Constitutional: Alert and oriented. Well appearing and in no acute distress. Eyes: OD conjunctivae are normal.  Left conjunctive a injected.  On fluorescein exam there is no dendritic lesions appreciated on either cornea.  Subjective decrease in visual acuity.  PERRL. Head: Atraumatic.  Rash to left forehead and scalp Nose: No congestion/rhinnorhea. Mouth/Throat: Mucous membranes are moist. Neck: No stridor Cardiovascular: Grossly normal heart sounds.  Good peripheral circulation. Respiratory: Normal respiratory effort.  No retractions. Gastrointestinal: Soft and nontender. No distention. Musculoskeletal: No obvious deformities Neurologic:  Normal speech and language. No gross focal neurologic deficits are appreciated. Skin:  Skin is warm and dry.  Erythematous plaque with areas of eschar centrally Psychiatric: Mood and affect are normal. Speech and behavior are normal.  ____________________________________________   LABS (all labs ordered are listed, but only abnormal results are displayed)  Labs Reviewed  CBC - Abnormal; Notable for the following components:      Result Value   WBC 11.2 (*)    All other components within normal limits  BASIC METABOLIC PANEL - Abnormal; Notable for the following components:   Potassium 3.2 (*)    Chloride 96 (*)    Glucose, Bld 109 (*)    BUN 32 (*)    Creatinine, Ser 1.44 (*)    GFR, Estimated 50 (*)    All other components within normal limits    PROCEDURES  Procedure(s) performed (including Critical  Care):  Procedures   ____________________________________________   INITIAL IMPRESSION / ASSESSMENT AND PLAN / ED COURSE  As part of my medical decision making, I reviewed the following data within the electronic medical record, if available:  Nursing notes reviewed and incorporated, Labs reviewed, EKG interpreted, Old chart reviewed, Radiograph reviewed and Notes from prior ED visits reviewed and incorporated       Patient is non-toxic appearing and well hydrated. Ddx: Patients symptoms not typical for other emergent causes of rash such as cellulitis, abscess, necrotizing fasciitis, vasculitis, anaphylaxis, SJS or TENS.  Patient does have signs and symptoms of zoster infection with rash as well as possible zoster ophthalmicus without dendritic lesions on exam. Rx: Valtrex 1 g 3 times daily x10 days Consult: Follow-up with Dr. 14/11/22 in the next 1-3 days for further evaluation and management Disposition: Patient will be discharged with strict return precautions and follow up with pediatrician within 24-48 hours for further evaluation.      ____________________________________________   FINAL CLINICAL IMPRESSION(S) / ED DIAGNOSES  Final diagnoses:  Herpes zoster conjunctivitis     ED Discharge Orders          Ordered    valACYclovir (VALTREX) 1000 MG tablet  3 times daily        03/11/21 1706             Note:  This document was prepared using Dragon voice recognition software and may include unintentional dictation errors.    14/11/22, MD 03/11/21 [phone]

## 2021-03-11 NOTE — ED Notes (Signed)
Pt to ED for L eye pain. Pt from [facility]. States he had MRSA and was treating R eye but now L eye feels swollen "like something's in there". L side face and scalp appear reddened compared to R. Denies vision changes.

## 2021-03-11 NOTE — ED Triage Notes (Incomplete)
Pt in via EMS from

## 2021-03-16 ENCOUNTER — Other Ambulatory Visit: Payer: Self-pay

## 2021-03-16 ENCOUNTER — Inpatient Hospital Stay
Admission: EM | Admit: 2021-03-16 | Discharge: 2021-03-26 | DRG: 698 | Disposition: A | Discharge status: Skilled Nursing Facility | Payer: No Typology Code available for payment source | Source: Skilled Nursing Facility | Attending: Internal Medicine | Admitting: Internal Medicine

## 2021-03-16 ENCOUNTER — Emergency Department: Payer: No Typology Code available for payment source

## 2021-03-16 DIAGNOSIS — F32A Depression, unspecified: Secondary | ICD-10-CM | POA: Diagnosis present

## 2021-03-16 DIAGNOSIS — R6521 Severe sepsis with septic shock: Secondary | ICD-10-CM | POA: Diagnosis present

## 2021-03-16 DIAGNOSIS — J449 Chronic obstructive pulmonary disease, unspecified: Secondary | ICD-10-CM | POA: Diagnosis present

## 2021-03-16 DIAGNOSIS — R339 Retention of urine, unspecified: Secondary | ICD-10-CM | POA: Diagnosis present

## 2021-03-16 DIAGNOSIS — Z1612 Extended spectrum beta lactamase (ESBL) resistance: Secondary | ICD-10-CM | POA: Diagnosis present

## 2021-03-16 DIAGNOSIS — K560 Paralytic ileus: Secondary | ICD-10-CM | POA: Diagnosis not present

## 2021-03-16 DIAGNOSIS — R0602 Shortness of breath: Secondary | ICD-10-CM

## 2021-03-16 DIAGNOSIS — R4182 Altered mental status, unspecified: Secondary | ICD-10-CM | POA: Diagnosis not present

## 2021-03-16 DIAGNOSIS — J111 Influenza due to unidentified influenza virus with other respiratory manifestations: Secondary | ICD-10-CM | POA: Diagnosis not present

## 2021-03-16 DIAGNOSIS — Z79899 Other long term (current) drug therapy: Secondary | ICD-10-CM

## 2021-03-16 DIAGNOSIS — Z7901 Long term (current) use of anticoagulants: Secondary | ICD-10-CM

## 2021-03-16 DIAGNOSIS — D509 Iron deficiency anemia, unspecified: Secondary | ICD-10-CM | POA: Diagnosis present

## 2021-03-16 DIAGNOSIS — Z96643 Presence of artificial hip joint, bilateral: Secondary | ICD-10-CM | POA: Diagnosis present

## 2021-03-16 DIAGNOSIS — E785 Hyperlipidemia, unspecified: Secondary | ICD-10-CM | POA: Diagnosis present

## 2021-03-16 DIAGNOSIS — Z7984 Long term (current) use of oral hypoglycemic drugs: Secondary | ICD-10-CM

## 2021-03-16 DIAGNOSIS — G4700 Insomnia, unspecified: Secondary | ICD-10-CM | POA: Diagnosis present

## 2021-03-16 DIAGNOSIS — Z20822 Contact with and (suspected) exposure to covid-19: Secondary | ICD-10-CM | POA: Diagnosis present

## 2021-03-16 DIAGNOSIS — E1140 Type 2 diabetes mellitus with diabetic neuropathy, unspecified: Secondary | ICD-10-CM | POA: Diagnosis present

## 2021-03-16 DIAGNOSIS — I959 Hypotension, unspecified: Secondary | ICD-10-CM

## 2021-03-16 DIAGNOSIS — N179 Acute kidney failure, unspecified: Secondary | ICD-10-CM | POA: Diagnosis present

## 2021-03-16 DIAGNOSIS — K567 Ileus, unspecified: Secondary | ICD-10-CM

## 2021-03-16 DIAGNOSIS — I10 Essential (primary) hypertension: Secondary | ICD-10-CM | POA: Diagnosis present

## 2021-03-16 DIAGNOSIS — R251 Tremor, unspecified: Secondary | ICD-10-CM | POA: Diagnosis present

## 2021-03-16 DIAGNOSIS — I480 Paroxysmal atrial fibrillation: Secondary | ICD-10-CM | POA: Diagnosis present

## 2021-03-16 DIAGNOSIS — A419 Sepsis, unspecified organism: Secondary | ICD-10-CM | POA: Diagnosis not present

## 2021-03-16 DIAGNOSIS — N401 Enlarged prostate with lower urinary tract symptoms: Secondary | ICD-10-CM | POA: Diagnosis present

## 2021-03-16 DIAGNOSIS — Z66 Do not resuscitate: Secondary | ICD-10-CM | POA: Diagnosis not present

## 2021-03-16 DIAGNOSIS — R7989 Other specified abnormal findings of blood chemistry: Secondary | ICD-10-CM

## 2021-03-16 DIAGNOSIS — M1991 Primary osteoarthritis, unspecified site: Secondary | ICD-10-CM | POA: Diagnosis present

## 2021-03-16 DIAGNOSIS — B0231 Zoster conjunctivitis: Secondary | ICD-10-CM | POA: Diagnosis present

## 2021-03-16 DIAGNOSIS — J9611 Chronic respiratory failure with hypoxia: Secondary | ICD-10-CM | POA: Diagnosis present

## 2021-03-16 DIAGNOSIS — N390 Urinary tract infection, site not specified: Secondary | ICD-10-CM | POA: Diagnosis present

## 2021-03-16 DIAGNOSIS — T83511A Infection and inflammatory reaction due to indwelling urethral catheter, initial encounter: Principal | ICD-10-CM | POA: Diagnosis present

## 2021-03-16 DIAGNOSIS — F419 Anxiety disorder, unspecified: Secondary | ICD-10-CM | POA: Diagnosis present

## 2021-03-16 DIAGNOSIS — Z87891 Personal history of nicotine dependence: Secondary | ICD-10-CM

## 2021-03-16 DIAGNOSIS — I482 Chronic atrial fibrillation, unspecified: Secondary | ICD-10-CM | POA: Diagnosis present

## 2021-03-16 DIAGNOSIS — A4151 Sepsis due to Escherichia coli [E. coli]: Secondary | ICD-10-CM | POA: Diagnosis present

## 2021-03-16 LAB — TROPONIN I (HIGH SENSITIVITY)
Troponin I (High Sensitivity): 11 ng/L (ref <18)
Troponin I (High Sensitivity): 6 ng/L (ref <18)

## 2021-03-16 LAB — CBC WITH DIFFERENTIAL/PLATELET
Abs Immature Granulocytes: 0.09 10*3/uL — ABNORMAL HIGH (ref 0.00–0.07)
Basophils Absolute: 0.1 10*3/uL (ref 0.0–0.1)
Basophils Relative: 1 %
Eosinophils Absolute: 0.2 10*3/uL (ref 0.0–0.5)
Eosinophils Relative: 1 %
HCT: 46.1 % (ref 39.0–52.0)
Hemoglobin: 15.2 g/dL (ref 13.0–17.0)
Immature Granulocytes: 1 %
Lymphocytes Relative: 20 %
Lymphs Abs: 2.3 10*3/uL (ref 0.7–4.0)
MCH: 31 pg (ref 26.0–34.0)
MCHC: 33 g/dL (ref 30.0–36.0)
MCV: 94.1 fL (ref 80.0–100.0)
Monocytes Absolute: 0.7 10*3/uL (ref 0.1–1.0)
Monocytes Relative: 6 %
Neutro Abs: 8 10*3/uL — ABNORMAL HIGH (ref 1.7–7.7)
Neutrophils Relative %: 71 %
Platelets: 287 10*3/uL (ref 150–400)
RBC: 4.9 MIL/uL (ref 4.22–5.81)
RDW: 15.2 % (ref 11.5–15.5)
WBC: 11.3 10*3/uL — ABNORMAL HIGH (ref 4.0–10.5)
nRBC: 0.6 % — ABNORMAL HIGH (ref 0.0–0.2)

## 2021-03-16 LAB — URINALYSIS, ROUTINE W REFLEX MICROSCOPIC
Bilirubin Urine: NEGATIVE
Glucose, UA: NEGATIVE mg/dL
Ketones, ur: 5 mg/dL — AB
Nitrite: POSITIVE — AB
Protein, ur: NEGATIVE mg/dL
RBC / HPF: >50 RBC/hpf — ABNORMAL HIGH (ref 0–5)
Specific Gravity, Urine: 1.015 (ref 1.005–1.030)
Squamous Epithelial / HPF: NONE SEEN (ref 0–5)
WBC, UA: >50 WBC/hpf — ABNORMAL HIGH (ref 0–5)
pH: 5 (ref 5.0–8.0)

## 2021-03-16 LAB — LACTIC ACID, PLASMA
Lactic Acid, Venous: 3.8 mmol/L (ref 0.5–1.9)
Lactic Acid, Venous: 3.9 mmol/L (ref 0.5–1.9)
Lactic Acid, Venous: 3.9 mmol/L (ref 0.5–1.9)

## 2021-03-16 LAB — PROCALCITONIN: Procalcitonin: <0.1 ng/mL

## 2021-03-16 LAB — COMPREHENSIVE METABOLIC PANEL
ALT: 8 U/L (ref 0–44)
AST: 16 U/L (ref 15–41)
Albumin: 3.4 g/dL — ABNORMAL LOW (ref 3.5–5.0)
Alkaline Phosphatase: 78 U/L (ref 38–126)
Anion gap: 10 (ref 5–15)
BUN: 51 mg/dL — ABNORMAL HIGH (ref 8–23)
CO2: 28 mmol/L (ref 22–32)
Calcium: 9 mg/dL (ref 8.9–10.3)
Chloride: 100 mmol/L (ref 98–111)
Creatinine, Ser: 2.03 mg/dL — ABNORMAL HIGH (ref 0.61–1.24)
GFR, Estimated: 33 mL/min — ABNORMAL LOW (ref >60)
Glucose, Bld: 119 mg/dL — ABNORMAL HIGH (ref 70–99)
Potassium: 3.2 mmol/L — ABNORMAL LOW (ref 3.5–5.1)
Sodium: 138 mmol/L (ref 135–145)
Total Bilirubin: 1 mg/dL (ref 0.3–1.2)
Total Protein: 6.9 g/dL (ref 6.5–8.1)

## 2021-03-16 LAB — RESP PANEL BY RT-PCR (FLU A&B, COVID) ARPGX2
Influenza A by PCR: NEGATIVE
Influenza B by PCR: NEGATIVE
SARS Coronavirus 2 by RT PCR: NEGATIVE

## 2021-03-16 LAB — CBG MONITORING, ED: Glucose-Capillary: 79 mg/dL (ref 70–99)

## 2021-03-16 LAB — APTT: aPTT: 39 seconds — ABNORMAL HIGH (ref 24–36)

## 2021-03-16 LAB — PROTIME-INR
INR: 2.2 — ABNORMAL HIGH (ref 0.8–1.2)
Prothrombin Time: 24.5 seconds — ABNORMAL HIGH (ref 11.4–15.2)

## 2021-03-16 MED ORDER — APIXABAN 5 MG PO TABS
5.0000 mg | ORAL_TABLET | Freq: Two times a day (BID) | ORAL | Status: DC
  Administered 2021-03-17 – 2021-03-26 (×19): 5 mg via ORAL
  Filled 2021-03-16 (×19): qty 1

## 2021-03-16 MED ORDER — ESCITALOPRAM OXALATE 10 MG PO TABS
5.0000 mg | ORAL_TABLET | Freq: Every day | ORAL | Status: DC
  Administered 2021-03-17 – 2021-03-26 (×10): 5 mg via ORAL
  Filled 2021-03-16 (×2): qty 0.5
  Filled 2021-03-16: qty 1
  Filled 2021-03-16 (×7): qty 0.5

## 2021-03-16 MED ORDER — VANCOMYCIN HCL 1500 MG/300ML IV SOLN
1500.0000 mg | Freq: Once | INTRAVENOUS | Status: AC
  Administered 2021-03-16: 1500 mg via INTRAVENOUS
  Filled 2021-03-16: qty 300

## 2021-03-16 MED ORDER — INSULIN ASPART 100 UNIT/ML IJ SOLN
0.0000 [IU] | Freq: Every day | INTRAMUSCULAR | Status: DC
  Administered 2021-03-24: 23:00:00 2 [IU] via SUBCUTANEOUS
  Filled 2021-03-16: qty 1

## 2021-03-16 MED ORDER — IPRATROPIUM-ALBUTEROL 0.5-2.5 (3) MG/3ML IN SOLN
3.0000 mL | Freq: Every day | RESPIRATORY_TRACT | Status: DC
  Administered 2021-03-17 – 2021-03-24 (×5): 3 mL via RESPIRATORY_TRACT
  Filled 2021-03-16 (×6): qty 3

## 2021-03-16 MED ORDER — ENOXAPARIN SODIUM 30 MG/0.3ML IJ SOSY: 30.0000 mg | PREFILLED_SYRINGE | INTRAMUSCULAR | Status: DC

## 2021-03-16 MED ORDER — METRONIDAZOLE 500 MG/100ML IV SOLN
500.0000 mg | Freq: Once | INTRAVENOUS | Status: AC
  Administered 2021-03-16: 500 mg via INTRAVENOUS
  Filled 2021-03-16: qty 100

## 2021-03-16 MED ORDER — LACTATED RINGERS IV SOLN: INTRAVENOUS | Status: DC

## 2021-03-16 MED ORDER — ACETAMINOPHEN 325 MG PO TABS
650.0000 mg | ORAL_TABLET | Freq: Four times a day (QID) | ORAL | Status: DC | PRN
  Administered 2021-03-23: 09:00:00 650 mg via ORAL
  Filled 2021-03-16 (×2): qty 2

## 2021-03-16 MED ORDER — ALBUTEROL SULFATE 108 (90 BASE) MCG/ACT IN AEPB: 2.0000 | INHALATION_SPRAY | RESPIRATORY_TRACT | Status: DC | PRN

## 2021-03-16 MED ORDER — SODIUM CHLORIDE 0.9 % IV SOLN
2.0000 g | Freq: Once | INTRAVENOUS | Status: AC
  Administered 2021-03-16: 2 g via INTRAVENOUS
  Filled 2021-03-16: qty 2

## 2021-03-16 MED ORDER — VANCOMYCIN VARIABLE DOSE PER UNSTABLE RENAL FUNCTION (PHARMACIST DOSING): Status: DC

## 2021-03-16 MED ORDER — MIDODRINE HCL 5 MG PO TABS: 10.0000 mg | ORAL_TABLET | Freq: Three times a day (TID) | ORAL | Status: DC

## 2021-03-16 MED ORDER — MIDODRINE HCL 5 MG PO TABS
10.0000 mg | ORAL_TABLET | Freq: Three times a day (TID) | ORAL | Status: DC
  Administered 2021-03-17 – 2021-03-19 (×8): 10 mg via ORAL
  Filled 2021-03-16 (×8): qty 2

## 2021-03-16 MED ORDER — LACTATED RINGERS IV BOLUS (SEPSIS)
250.0000 mL | Freq: Once | INTRAVENOUS | Status: AC
  Administered 2021-03-16: 250 mL via INTRAVENOUS

## 2021-03-16 MED ORDER — ACETAMINOPHEN 650 MG RE SUPP
650.0000 mg | Freq: Four times a day (QID) | RECTAL | Status: DC | PRN
  Filled 2021-03-16: qty 1

## 2021-03-16 MED ORDER — CARBIDOPA-LEVODOPA 25-100 MG PO TABS
1.0000 | ORAL_TABLET | Freq: Three times a day (TID) | ORAL | Status: DC
  Administered 2021-03-17 – 2021-03-26 (×28): 1 via ORAL
  Filled 2021-03-16 (×31): qty 1

## 2021-03-16 MED ORDER — ONDANSETRON HCL 4 MG PO TABS: 4.0000 mg | ORAL_TABLET | Freq: Four times a day (QID) | ORAL | Status: DC | PRN

## 2021-03-16 MED ORDER — ALBUTEROL SULFATE (2.5 MG/3ML) 0.083% IN NEBU
2.5000 mg | INHALATION_SOLUTION | RESPIRATORY_TRACT | Status: DC | PRN
  Administered 2021-03-22: 23:00:00 2.5 mg via RESPIRATORY_TRACT
  Filled 2021-03-16: qty 3

## 2021-03-16 MED ORDER — IPRATROPIUM-ALBUTEROL 0.5-2.5 (3) MG/3ML IN SOLN: 3.0000 mL | Freq: Four times a day (QID) | RESPIRATORY_TRACT | Status: DC | PRN

## 2021-03-16 MED ORDER — FLUTICASONE PROPIONATE 50 MCG/ACT NA SUSP
1.0000 | Freq: Every day | NASAL | Status: DC
  Administered 2021-03-19 – 2021-03-26 (×8): 1 via NASAL
  Filled 2021-03-16: qty 16

## 2021-03-16 MED ORDER — GLUCERNA SHAKE PO LIQD
237.0000 mL | Freq: Two times a day (BID) | ORAL | Status: DC
  Administered 2021-03-17 – 2021-03-26 (×15): 237 mL via ORAL

## 2021-03-16 MED ORDER — HYDROXYZINE HCL 25 MG PO TABS: 25.0000 mg | ORAL_TABLET | Freq: Two times a day (BID) | ORAL | Status: DC | PRN

## 2021-03-16 MED ORDER — INSULIN ASPART 100 UNIT/ML IJ SOLN
0.0000 [IU] | Freq: Three times a day (TID) | INTRAMUSCULAR | Status: DC
  Administered 2021-03-18 (×2): 1 [IU] via SUBCUTANEOUS
  Administered 2021-03-19 – 2021-03-23 (×5): 2 [IU] via SUBCUTANEOUS
  Administered 2021-03-24 – 2021-03-26 (×7): 1 [IU] via SUBCUTANEOUS
  Filled 2021-03-16 (×13): qty 1

## 2021-03-16 MED ORDER — VALACYCLOVIR HCL 500 MG PO TABS
1000.0000 mg | ORAL_TABLET | Freq: Three times a day (TID) | ORAL | Status: AC
  Administered 2021-03-17 – 2021-03-22 (×18): 1000 mg via ORAL
  Filled 2021-03-16 (×20): qty 2

## 2021-03-16 MED ORDER — ONDANSETRON HCL 4 MG/2ML IJ SOLN
4.0000 mg | Freq: Four times a day (QID) | INTRAMUSCULAR | Status: DC | PRN
  Administered 2021-03-19 – 2021-03-20 (×3): 4 mg via INTRAVENOUS
  Filled 2021-03-16 (×3): qty 2

## 2021-03-16 MED ORDER — LACTATED RINGERS IV BOLUS
1000.0000 mL | Freq: Once | INTRAVENOUS | Status: AC
Start: 2021-03-16 — End: 2021-03-16
  Administered 2021-03-16: 1000 mL via INTRAVENOUS

## 2021-03-16 MED ORDER — SODIUM CHLORIDE 0.9 % IV SOLN
2.0000 g | INTRAVENOUS | Status: DC
Start: 2021-03-17 — End: 2021-03-17

## 2021-03-16 MED ORDER — LACTATED RINGERS IV BOLUS
1000.0000 mL | Freq: Once | INTRAVENOUS | Status: AC
  Administered 2021-03-16: 1000 mL via INTRAVENOUS

## 2021-03-16 MED ORDER — SODIUM CHLORIDE 0.9 % IV BOLUS
1000.0000 mL | Freq: Once | INTRAVENOUS | Status: AC
  Administered 2021-03-16: 1000 mL via INTRAVENOUS

## 2021-03-16 MED ORDER — VANCOMYCIN HCL IN DEXTROSE 1-5 GM/200ML-% IV SOLN: 1000.0000 mg | Freq: Once | INTRAVENOUS | Status: DC

## 2021-03-16 MED ORDER — FLUTICASONE-UMECLIDIN-VILANT 100-62.5-25 MCG/ACT IN AEPB: 1.0000 | INHALATION_SPRAY | Freq: Every day | RESPIRATORY_TRACT | Status: DC

## 2021-03-16 MED ORDER — OXYBUTYNIN CHLORIDE ER 10 MG PO TB24
10.0000 mg | ORAL_TABLET | Freq: Every day | ORAL | Status: DC
  Administered 2021-03-17 – 2021-03-26 (×10): 10 mg via ORAL
  Filled 2021-03-16 (×10): qty 1

## 2021-03-16 NOTE — Consult Note (Signed)
PHARMACY -  BRIEF ANTIBIOTIC NOTE   Pharmacy has received consult(s) for cefepime and vancomycin from an ED provider.  The patient's profile has been reviewed for ht/wt/allergies/indication/available labs.    One time order(s) placed for  Cefepime 2 gram Vancomycin 1500 mg   Further antibiotics/pharmacy consults should be ordered by admitting physician if indicated.                       Thank you, Sharen Hones, PharmD, BCPS Clinical Pharmacist   03/16/2021  6:39 PM

## 2021-03-16 NOTE — Sepsis Progress Note (Signed)
Sepsis protocol monitored by eLink 

## 2021-03-16 NOTE — ED Triage Notes (Signed)
Pt to ER via [facility] from [facility] with complaints of "decline in health". [facility] on [facility]. Pt also has pre-exiting UTI and shingles with blisters present to left eye. Pt also with foley present, sediment and dark colored urine in bag. Pt also with wound present to left heel.   EMS vs- temp 98.8, placed on 4l via  by facility due to room air sats being 77-80%, BP 116/72

## 2021-03-16 NOTE — ED Provider Notes (Addendum)
[facility] Emergency Department Provider Note  ____________________________________________   Event Date/Time   First MD Initiated Contact with Patient 03/16/21 [phone]     (approximate)  I have reviewed the triage vital signs and the nursing notes.   HISTORY  Chief Complaint Dysuria    HPI Raymond Hays is a 75 y.o. male sent from [facility] for "declining health".  Symptoms reportedly started on Tuesday.  Room air sats were 77-80 per EMS.  Blood pressure initially was 116/72.  Patient was placed on 4 L of nasal cannula by facility.  He was afebrile at facility.  Blood pressure appears to be consistent with previous readings in the old records but his blood pressure here was 92/50 which is definitely low for him.  I do not have any record of his previous blood pressures     at the facility.  Patient is recovering from shingles.  He has scabbing on his forehead and left side of his scalp.  He reports his eyes very painful.  He cannot see out of it.        Past Medical History:  Diagnosis Date   Allergic rhinitis    Anxiety    Arthritis    Chronic indwelling Foley catheter    COPD (chronic obstructive pulmonary disease) (HCC)    Cough    Diabetes (HCC)    Essential (primary) hypertension    Hemorrhoid    Hyperlipidemia    PAF (paroxysmal atrial fibrillation) (HCC)    Primary osteoarthritis, unspecified shoulder    Retention of urine, unspecified    Spinal stenosis    UTI (urinary tract infection)     Patient Active Problem List   Diagnosis Date Noted   Obstipation 01/30/2021   Anxiety 01/30/2021   Iron deficiency anemia    Edema leg    Respiratory failure (HCC) 01/09/2021   Ileus (HCC)    Neck pain    AKI (acute kidney injury) (HCC)    Type 2 diabetes mellitus with diabetic neuropathy, without long-term current use of insulin (HCC)    Parkinson disease (HCC)    Sepsis (HCC) 10/16/2020   Chronic respiratory failure with hypoxia (HCC)  01/10/2020   Impaired functional mobility, balance, gait, and endurance 09/15/2019   Catheter cystitis (HCC) 10/16/2018   COVID-19 virus detected 07/03/2018   Benign prostatic hyperplasia with lower urinary tract symptoms 08/06/2017   Osteoarthritis of right knee 06/19/2017   S/P orthopedic surgery, follow-up exam 10/25/2016   Tendon rupture of wrist, sequela 01/02/2016   Acute pain of right knee 08/03/2015   Calcific tendinitis of left shoulder 07/05/2015   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/05/2015   Gait difficulty 06/26/2015   Severe mitral regurgitation 06/05/2015   AF (paroxysmal atrial fibrillation) (HCC) 05/31/2015   Chronic pain 05/31/2015   COPD with acute exacerbation (HCC) 05/31/2015   Essential hypertension 05/31/2015   Chronic pain of left wrist 04/24/2015   Arthritis of left wrist 04/24/2015   Rupture of extensor tendon of left hand 04/24/2015    Past Surgical History:  Procedure Laterality Date   APPENDECTOMY     BACK SURGERY     COLONOSCOPY N/A 10/27/2020   Procedure: COLONOSCOPY;  Surgeon: Wyline Mood, MD;  Location: [facility] ENDOSCOPY;  Service: Gastroenterology;  Laterality: N/A;   ELBOW SURGERY     ESOPHAGOGASTRODUODENOSCOPY (EGD) WITH PROPOFOL N/A 10/27/2020   Procedure: ESOPHAGOGASTRODUODENOSCOPY (EGD) WITH PROPOFOL;  Surgeon: Wyline Mood, MD;  Location: [facility] ENDOSCOPY;  Service: Gastroenterology;  Laterality: N/A;  HERNIA REPAIR     KNEE SURGERY     neck     PARTIAL HIP ARTHROPLASTY Bilateral    TEE WITHOUT CARDIOVERSION N/A 10/19/2020   Procedure: TRANSESOPHAGEAL ECHOCARDIOGRAM (TEE);  Surgeon: Antonieta Iba, MD;  Location: [facility] ORS;  Service: Cardiovascular;  Laterality: N/A;   TONSILLECTOMY      Prior to Admission medications   Medication Sig Start Date End Date Taking? Authorizing Provider  acetaminophen (TYLENOL) 325 MG tablet Take 650 mg by mouth every 4 (four) hours as needed.    [provider]  Albuterol Sulfate (PROAIR RESPICLICK)  108 (90 Base) MCG/ACT AEPB Inhale 2 puffs into the lungs every 4 (four) hours as needed (COPD).    [provider]  amiodarone (PACERONE) 400 MG tablet Take 1 tablet (400 mg total) by mouth daily. 01/23/21   Tresa Moore, MD  ANTI-DANDRUFF 1 % SHAM Apply topically. 02/18/20   [provider]  apixaban (ELIQUIS) 5 MG TABS tablet Take 5 mg by mouth 2 (two) times daily.  02/06/17   [provider]  atorvastatin (LIPITOR) 20 MG tablet Take 20 mg by mouth daily.    [provider]  BIOFREEZE 4 % GEL Apply 1 application topically 3 (three) times daily. 11/17/20   [provider]  bisacodyl (DULCOLAX) 10 MG suppository Place 10 mg rectally as needed for moderate constipation.    [provider]  calcium carbonate (TUMS - DOSED IN MG ELEMENTAL CALCIUM) 500 MG chewable tablet Chew 2 tablets by mouth every 6 (six) hours as needed for indigestion or heartburn.    [provider]  carbidopa-levodopa (SINEMET IR) 25-100 MG tablet Take 1 tablet by mouth 3 (three) times daily before meals. At 0630, 1130, 1630    [provider]  Docusate Sodium (STOOL SOFTENER LAXATIVE PO) Take 2 tablets by mouth at bedtime. Patient not taking: Reported on 01/30/2021    [provider]  Emollient (CETAPHIL) cream Apply 1 application topically as needed.    [provider]  escitalopram (LEXAPRO) 5 MG tablet Take 5 mg by mouth daily.    [provider]  feeding supplement, GLUCERNA SHAKE, (GLUCERNA SHAKE) LIQD Take 237 mLs by mouth 2 (two) times daily between meals. 11/01/20   [name], MD  finasteride (PROSCAR) 5 MG tablet Take 5 mg by mouth daily.    [provider]  fluticasone (FLONASE) 50 MCG/ACT nasal spray Place 1 spray into both nostrils daily.    [provider]  gabapentin (NEURONTIN) 800 MG tablet Take 800 mg by mouth 2 (two) times daily.     [provider]  guaiFENesin (MUCINEX) 600 MG 12 hr  tablet Take 1 tablet (600 mg total) by mouth 2 (two) times daily. 01/22/21   Tresa Moore, MD  hydrOXYzine (ATARAX/VISTARIL) 25 MG tablet Take 25 mg by mouth 2 (two) times daily as needed for itching. 02/17/20   [provider]  ipratropium (ATROVENT) 0.03 % nasal spray Place 2 sprays into both nostrils 2 (two) times daily. 02/18/20   [provider]  ipratropium-albuterol (DUONEB) 0.5-2.5 (3) MG/3ML SOLN Inhale 3 mLs into the lungs daily. And every 6 hours as needed for shortness of breath or wheezing    [provider]  lactase (LACTAID) 3000 units tablet Take 3,000 Units by mouth 3 (three) times daily with meals.    [provider]  metFORMIN (GLUCOPHAGE-XR) 500 MG 24 hr tablet Take 500 mg by mouth daily.  [provider]  methocarbamol (ROBAXIN) 500 MG tablet Take 1,000 mg by mouth in the morning and at bedtime.     [provider]  metoprolol tartrate (LOPRESSOR) 100 MG tablet Take 1 tablet (100 mg total) by mouth 2 (two) times daily. 01/22/21   Sreenath, Jonelle Sports, MD  mineral oil enema Place 133 mLs (1 enema total) rectally daily. 02/03/21   Zigmund Daniel., MD  oxybutynin (DITROPAN-XL) 10 MG 24 hr tablet Take 1 tablet (10 mg total) by mouth daily. 11/01/20   [name], MD  pantoprazole (PROTONIX) 40 MG tablet Take 1 tablet (40 mg total) by mouth 2 (two) times daily before a meal. For 8 weeks. 11/01/20 01/30/21  [name], MD  polyethylene glycol (MIRALAX / GLYCOLAX) 17 g packet Take 17 g by mouth daily.    [provider]  psyllium (METAMUCIL) 58.6 % powder Take 1 packet by mouth daily.    [provider]  SARNA lotion Apply topically. 02/25/20   [provider]  senna (SENOKOT) 8.6 MG TABS tablet Take 2 tablets by mouth at bedtime.    [provider]  simethicone (MYLICON) 125 MG chewable tablet Chew 125 mg by mouth 3 (three) times daily.    [provider]  Skin Protectants, Misc.  (MINERIN CREME EX) Apply topically. Apply to feet twice bid    [provider]  torsemide (DEMADEX) 10 MG tablet Take 10 mg by mouth daily.    [provider]  traZODone (DESYREL) 100 MG tablet Take 100 mg by mouth at bedtime.    [provider]  TRELEGY ELLIPTA 100-62.5-25 MCG/INH AEPB Inhale 1 puff into the lungs daily. 02/17/20   [provider]  triamcinolone cream (KENALOG) 0.1 % Apply 1 application topically 2 (two) times daily.    [provider]  valACYclovir (VALTREX) 1000 MG tablet Take 1 tablet (1,000 mg total) by mouth 3 (three) times daily for 10 days. 03/11/21 03/21/21  Merwyn Katos, MD    Allergies Patient has no known allergies.  Family History  Problem Relation Age of Onset   Healthy Son    Healthy Son     Social History Social History   Tobacco Use   Smoking status: Former    Packs/day: 0.50    Years: 10.00    Pack years: 5.00    Types: Cigarettes    Quit date: 2006    Years since quitting: 16.9   Smokeless tobacco: Never  Vaping Use   Vaping Use: Never used  Substance Use Topics   Alcohol use: Not Currently   Drug use: Not Currently    Types: "Crack" cocaine    Comment: quit "crack" 15 years ago--11/04/2019    Review of Systems  Constitutional: No fever/chills Eyes: No visual changes. ENT: No sore throat. Cardiovascular: Denies chest pain. Respiratory: Perhaps some shortness of breath. Gastrointestinal: No abdominal pain.  No nausea, no vomiting.  No diarrhea.  No constipation. Genitourinary: Negative for dysuria. Musculoskeletal: Negative for back pain. Skin: Negative for rash. Neurological: Negative for headaches, focal weakness   ____________________________________________   PHYSICAL EXAM:  VITAL SIGNS: ED Triage Vitals  Enc Vitals Group     BP 03/16/21 1548 (!) 92/50     Pulse Rate 03/16/21 1548 84     Resp 03/16/21 1548 19     Temp 03/16/21 1546 97.8 F (36.6 C)     Temp Source  03/16/21 1546 Oral     SpO2 03/16/21 1548 100 %  Weight --      Height 03/16/21 1548  (1.727 m)     Head Circumference --      Peak Flow --      Pain Score --      Pain Loc --      Pain Edu? --      Excl. in GC? --     Constitutional: Alert and oriented. Well appearing and in no acute distress but will not open his eyes because his eyes hurt.. Eyes: Conjunctivae are normal. PER fluorescein done on the left eye looks normal.  Patient complains of pain when he opens his eyes.  The pain is only in the left eye.  He is keeping his eyes closed.  He has had shingles on the left side of the scalp and forehead involving the upper eyelid.  He did not think his eye was involved.  The area is all scabbed now there are no fresh lesions.  The scalp is red and the whole area of the shingles. Head: Atraumatic.  See above for description of the scalp Nose: No congestion/rhinnorhea. Mouth/Throat: Mucous membranes are moist.  Oropharynx non-erythematous. Neck: No stridor.  Cardiovascular: Normal rate, regular rhythm. Grossly normal heart sounds.  Good peripheral circulation. Respiratory: Normal respiratory effort.  No retractions. Lungs CTAB. Gastrointestinal: Soft and nontender. No distention. No abdominal bruits. No CVA tenderness.  Patient with suprapubic tube Musculoskeletal: No lower extremity tenderness of the leg was apparently injured in the [place] and is very thin.  Right leg has been swollen for quite some time there is no new changes there..  Patient had some desquamation on the sole of the right foot about silver dollar in size this is been dressed by the nursing home. Neurologic:  Normal speech and language. No gross focal neurologic deficits are appreciated.  Skin:  Skin is warm, dry see above musculoskeletal eyes etc. for further description of the skin   ____________________________________________   LABS (all labs ordered are listed, but only abnormal results are displayed)  Labs  Reviewed  COMPREHENSIVE METABOLIC PANEL - Abnormal; Notable for the following components:      Result Value   Potassium 3.2 (*)    Glucose, Bld 119 (*)    BUN 51 (*)    Creatinine, Ser 2.03 (*)    Albumin 3.4 (*)    GFR, Estimated 33 (*)    All other components within normal limits  LACTIC ACID, PLASMA - Abnormal; Notable for the following components:   Lactic Acid, Venous 3.8 (*)    All other components within normal limits  CBC WITH DIFFERENTIAL/PLATELET - Abnormal; Notable for the following components:   WBC 11.3 (*)    nRBC 0.6 (*)    Neutro Abs 8.0 (*)    Abs Immature Granulocytes 0.09 (*)    All other components within normal limits  BLOOD GAS, VENOUS - Abnormal; Notable for the following components:   Bicarbonate 30.4 (*)    Acid-Base Excess 4.5 (*)    All other components within normal limits  PROTIME-INR - Abnormal; Notable for the following components:   Prothrombin Time 24.5 (*)    INR 2.2 (*)    All other components within normal limits  APTT - Abnormal; Notable for the following components:   aPTT 39 (*)    All other components within normal limits  URINE CULTURE  CULTURE, BLOOD (ROUTINE X 2)  CULTURE, BLOOD (ROUTINE X 2)  CULTURE, BLOOD (ROUTINE X 2)  CULTURE, BLOOD (ROUTINE  X 2)  LACTIC ACID, PLASMA  URINALYSIS, ROUTINE W REFLEX MICROSCOPIC  CBG MONITORING, ED  TROPONIN I (HIGH SENSITIVITY)  TROPONIN I (HIGH SENSITIVITY)   ____________________________________________  EKG   ____________________________________________  RADIOLOGY Jill Poling, personally viewed and evaluated these images (plain radiographs) as part of my medical decision making, as well as reviewing the written report by the radiologist.  ED MD interpretation: Chest x-ray read by radiology reviewed by me.  Radiologist does not feel there is any acute findings going on.  I am not so sure myself.  Looks like there is some increased interstitial markings to me.  Official  radiology report(s): DG Chest Portable 1 View  Result Date: 03/16/2021 CLINICAL DATA:  Weakness and hypoxia EXAM: PORTABLE CHEST 1 VIEW COMPARISON:  Chest radiograph 01/31/2021 FINDINGS: The heart is mildly enlarged, unchanged. The mediastinal contours are stable. Mitral valve clips are again noted. Lung volumes are low. There is no focal consolidation or pulmonary edema. There is no pleural effusion or pneumothorax. There is no acute osseous abnormality. IMPRESSION: No radiographic evidence of acute cardiopulmonary process. Electronically Signed   By: Lesia Hausen M.D.   On: 03/16/2021 16:29    ____________________________________________   PROCEDURES  Procedure(s) performed (including Critical Care): Critical care time 35 minutes.  This includes reviewing the patient's old labs old records and his current studies.  Additionally I have helped to turn him and repositioned his blood pressure cuff myself to check the blood pressure in the other arm and just now hung another liter of fluid on him.  Talk to the hospitalist.  Procedures   ____________________________________________   INITIAL IMPRESSION / ASSESSMENT AND PLAN / ED COURSE  Patient with deep wet cough and hypotension he has AKI compared to labs from just a couple days ago. The 11th of this month his GFR was 50 his BUN was 32 creatinine is 1.44 today his GFR is 33 BUN is 51 and creatinine is 2.03.  His white count is elevated at 11 it was elevated on the 11th.  He does have 71% polys.  His blood pressure is quite low 93/57 today.  ----------------------------------------- 5:32 PM on 03/16/2021 ----------------------------------------- Patient logrolled he has nothing on his back or sacrum.  He again reports his right leg has been swollen left leg has been skinny for quite some time.  Is not any different than usual.  His blood pressure has gone down somewhat in spite of having a liter and just a little bit more of fluid.  EMS gave  him a 500 bag and we have given him almost a liter so far.  I have ordered more fluid we will get antibiotics in case of sepsis.  I am worried about that because of his hypotension his cough history of hypoxia although he has not been hypoxic here and is been on room air.  Additionally he is got a very high lactic acid.  He also has AKI.  His urinalysis has not come back yet.  His urine is cloudy.  We will have to get him in the hospital.  We will get a second liter of fluid in him as I discussed.  If his pressure does not come up we might have to start him on pressors.    ----------------------------------------- 6:05 PM on 03/16/2021 ----------------------------------------- Patient's blood pressure is a little bit higher now 95.  I just hung 1/3 L of fluid.  I use warm fluids so he does not get hypothermic.  He  remains awake alert and oriented x3.  Urinalysis is still not back.  He is getting his antibiotics however.  Sources for sepsis which he appears to have could be lung or urine.  Patient is not stable for transfer to the [place] at this time.          ____________________________________________   FINAL CLINICAL IMPRESSION(S) / ED DIAGNOSES  Final diagnoses:  AKI (acute kidney injury) (HCC)  Hypotension, unspecified hypotension type  Elevated lactic acid level     ED Discharge Orders     None        Note:  This document was prepared using Dragon voice recognition software and may include unintentional dictation errors.    Arnaldo Natal, MD 03/16/21 1740    Arnaldo Natal, MD 03/16/21 1806    Arnaldo Natal, MD 03/16/21 [phone]

## 2021-03-16 NOTE — Progress Notes (Signed)
CODE SEPSIS - PHARMACY COMMUNICATION  **Broad Spectrum Antibiotics should be administered within 1 hour of Sepsis diagnosis**  Time Code Sepsis Called/Page Received: 1648  Antibiotics Ordered: cefepime, metronidazole, vancomycin  Time of 1st antibiotic administration: 1658   Raiford Noble ,PharmD Clinical Pharmacist  03/16/2021  5:12 PM

## 2021-03-16 NOTE — H&P (Addendum)
History and Physical   Raymond Hays [id number] DOB: [birth year] DOA: 03/16/2021  PCP: [place]  Outpatient Specialists: Dr. Elby Beck, cardiologist Patient coming from: Nursing facility, [facility]  I have personally briefly reviewed patient's old medical records in [place].  Chief Concern: Generalized weakness and decline in health since 03/14/2019  HPI: Raymond Hays is a 75 y.o. male with medical history significant for COPD, non-insulin-dependent diabetes mellitus, hypertension, history of hyperlipidemia, paroxysmal atrial fibrillation, osteoarthritis, history of spinal stenosis, on anticoagulation with eliquis, urinary retention, chronic indwelling Foley catheter, who presents emergency department for chief concerns of generalized weakness and health decline for 3 days.  He is able to tell me his name, age, current location of hospital.  He states that he feels very ill however he endorses that he does feel improved from when he initially came to the emergency department.  At bedside he denies any chest pain, shortness of breath, abdominal pain, dysuria, hematuria, diarrhea.  He denies any headaches, pain with swallowing.  He does have pain on the right side of his temples and he states that it is difficult for him to open his eyes due to shingles.  He reports that he bruises easily due to being on blood thinner.  He states that he has been compliant with his valacyclovir for the shingles.  Social history: He is from [place]. He reports that he is no longer smoking and that he quit more than 15 years ago.  He denies current EtOH and current recreational drug use.  He states he formally he smoked crack cocaine.  ROS: Constitutional: no weight change, no fever ENT/Mouth: no sore throat, no rhinorrhea Eyes: no eye pain, no vision changes Cardiovascular: no chest pain, no dyspnea,  no edema, no palpitations Respiratory: no cough, no sputum, no  wheezing Gastrointestinal: no nausea, no vomiting, no diarrhea, no constipation Genitourinary: no urinary incontinence, no dysuria, no hematuria Musculoskeletal: no arthralgias, + myalgias Skin: no skin lesions, no pruritus, Neuro: + weakness, no loss of consciousness, no syncope Psych: no anxiety, no depression, + decrease appetite Heme/Lymph: no bruising, no bleeding  ED Course: Discussed with emergency medicine provider, patient requiring hospitalization for chief concerns of severe sepsis.  Vitals in the emergency department was remarkable for temperature of 97.8, respiration rate of 19, heart rate of 84, initial blood pressure was 92/50 and improved to 103/76, SPO2 of 100% on room air.  Labs in the emergency department was remarkable for serum sodium 138, potassium 3.2, chloride of 100, bicarb of 28, BUN of 51, serum creatinine of 2.03, nonfasting blood glucose 119, WBC 11.3, hemoglobin 15.2, platelets of 287.  High-sensitivity troponin was 11.  GFR was 33.  Lactic acid was 3.8.  INR was 2.2, PT of 24.5, PTT of 39.  In the emergency department patient received cefepime, lactated ringer 1 L bolus, lactated ringer to 50 mL bolus, sodium chloride 1 L bolus.  EDP also ordered vancomycin.  EDP also started lactated ringer 150 IVF.  Assessment/Plan  Principal Problem:   Severe sepsis with septic shock (HCC) Active Problems:   AF (paroxysmal atrial fibrillation) (HCC)   Benign prostatic hyperplasia with lower urinary tract symptoms   Essential hypertension   Sepsis (HCC)   Iron deficiency anemia  # Severe sepsis with septic shock - Hypotension, responsive to IVF, altered mental status, increased lactic acid, elevated WBC, source of urine, organ involvement including renal -Ordered additional 1 L bolus, midodrine 10 mg - Check procalcitonin  which has been negative - MRSA PCR - Urine cultures in process - Blood cultures x2 has been collected  - Continue with cefepime and vancomycin per  pharmacy - I ordered another 1 L LR bolus, midodrine 10 mg p.o. 3 times daily with meals, including now - Admit to progressive cardiac, telemetry, observation - Strict I's and O's - Repeat lactic acid  # Acute kidney injury-secondary to the above number, treat as above - Strict I's and O's  # Paroxysmal atrial fibrillation-resumed home Eliquis - Given that there is QT prolongation and hypotension at this time, holding amiodarone 400 mg daily, metoprolol tartrate 100 mg p.o. twice daily  # Non-insulin-dependent diabetes mellitus-holding home metformin 500 mg daily at this time due to increased lactic acid - Insulin SSI with at bedtime coverage - Goal inpatient glucose level is 140-180  # Prolonged QT  # Tremors-resumed carbidopa-levodopa 25-100 mg p.o. 3 times daily - Patient states he has never been diagnosed with parkinsons   # Shingles at the left temporal -with crusting present - Resumed home valacyclovir 1000 mg p.o. 3 times daily  # Depression/anxiety-escitalopram 5 mg daily resumed  # Neuropathy-gabapentin 800 mg p.o. twice daily has been held at this time due to septic shock and altered mental status  # Insomnia-trazodone 100 mg nightly has been held at this time due to altered mental status and hypotension presentation  Chart reviewed.   10/19/2020: Echo was noted to have estimated ejection fraction of 60 to 65%, right ventricular systolic function was normal.  Right ventricular size was moderately enlarged.  Left atrial size was severely dilated.  DVT prophylaxis: Eliquis Code Status: full code  Diet: Heart healthy/carb modified Family Communication: No Disposition Plan: Pending clinical course Consults called: None at this time Admission status: Observation, progressive cardiac with telemetry  Past Medical History:  Diagnosis Date   Allergic rhinitis    Anxiety    Arthritis    Chronic indwelling Foley catheter    COPD (chronic obstructive pulmonary disease)  (HCC)    Cough    Diabetes ([place])    Essential (primary) hypertension    Hemorrhoid    Hyperlipidemia    PAF (paroxysmal atrial fibrillation) (HCC)    Primary osteoarthritis, unspecified shoulder    Retention of urine, unspecified    Spinal stenosis    UTI (urinary tract infection)    Past Surgical History:  Procedure Laterality Date   APPENDECTOMY     BACK SURGERY     COLONOSCOPY N/A 10/27/2020   Procedure: COLONOSCOPY;  Surgeon: Jonathon Bellows, MD;  Location: [facility] ENDOSCOPY;  Service: Gastroenterology;  Laterality: N/A;   ELBOW SURGERY     ESOPHAGOGASTRODUODENOSCOPY (EGD) WITH PROPOFOL N/A 10/27/2020   Procedure: ESOPHAGOGASTRODUODENOSCOPY (EGD) WITH PROPOFOL;  Surgeon: Jonathon Bellows, MD;  Location: [facility] ENDOSCOPY;  Service: Gastroenterology;  Laterality: N/A;   HERNIA REPAIR     KNEE SURGERY     neck     PARTIAL HIP ARTHROPLASTY Bilateral    TEE WITHOUT CARDIOVERSION N/A 10/19/2020   Procedure: TRANSESOPHAGEAL ECHOCARDIOGRAM (TEE);  Surgeon: Minna Merritts, MD;  Location: [facility] ORS;  Service: Cardiovascular;  Laterality: N/A;   TONSILLECTOMY     Social History:  reports that he quit smoking about 16 years ago. His smoking use included cigarettes. He has a 5.00 pack-year smoking history. He has never used smokeless tobacco. He reports that he does not currently use alcohol. He reports that he does not currently use drugs after having used the following drugs: "Crack" cocaine.  No Known Allergies Family History  Problem Relation Age of Onset   Healthy Son    Healthy Son    Family history: Family history reviewed and not pertinent  Prior to Admission medications   Medication Sig Start Date End Date Taking? Authorizing Provider  acetaminophen (TYLENOL) 325 MG tablet Take 650 mg by mouth every 4 (four) hours as needed.    [provider]  Albuterol Sulfate (PROAIR RESPICLICK) 123XX123 (90 Base) MCG/ACT AEPB Inhale 2 puffs into the lungs every 4 (four) hours as needed (COPD).     [provider]  amiodarone (PACERONE) 400 MG tablet Take 1 tablet (400 mg total) by mouth daily. 01/23/21   Sidney Ace, MD  ANTI-DANDRUFF 1 % SHAM Apply topically. 02/18/20   [provider]  apixaban (ELIQUIS) 5 MG TABS tablet Take 5 mg by mouth 2 (two) times daily.  02/06/17   [provider]  atorvastatin (LIPITOR) 20 MG tablet Take 20 mg by mouth daily.    [provider]  BIOFREEZE 4 % GEL Apply 1 application topically 3 (three) times daily. 11/17/20   [provider]  bisacodyl (DULCOLAX) 10 MG suppository Place 10 mg rectally as needed for moderate constipation.    [provider]  calcium carbonate (TUMS - DOSED IN MG ELEMENTAL CALCIUM) 500 MG chewable tablet Chew 2 tablets by mouth every 6 (six) hours as needed for indigestion or heartburn.    [provider]  carbidopa-levodopa (SINEMET IR) 25-100 MG tablet Take 1 tablet by mouth 3 (three) times daily before meals. At 0630, 1130, 1630    [provider]  Docusate Sodium (STOOL SOFTENER LAXATIVE PO) Take 2 tablets by mouth at bedtime. Patient not taking: Reported on 01/30/2021    [provider]  Emollient (CETAPHIL) cream Apply 1 application topically as needed.    [provider]  escitalopram (LEXAPRO) 5 MG tablet Take 5 mg by mouth daily.    [provider]  feeding supplement, GLUCERNA SHAKE, (GLUCERNA SHAKE) LIQD Take 237 mLs by mouth 2 (two) times daily between meals. 11/01/20   Enzo Bi, MD  finasteride (PROSCAR) 5 MG tablet Take 5 mg by mouth daily.    [provider]  fluticasone (FLONASE) 50 MCG/ACT nasal spray Place 1 spray into both nostrils daily.    [provider]  gabapentin (NEURONTIN) 800 MG tablet Take 800 mg by mouth 2 (two) times daily.     [provider]  guaiFENesin (MUCINEX) 600 MG 12 hr tablet Take 1 tablet (600 mg total) by mouth 2 (two) times daily. 01/22/21   Sidney Ace,  MD  hydrOXYzine (ATARAX/VISTARIL) 25 MG tablet Take 25 mg by mouth 2 (two) times daily as needed for itching. 02/17/20   [provider]  ipratropium (ATROVENT) 0.03 % nasal spray Place 2 sprays into both nostrils 2 (two) times daily. 02/18/20   [provider]  ipratropium-albuterol (DUONEB) 0.5-2.5 (3) MG/3ML SOLN Inhale 3 mLs into the lungs daily. And every 6 hours as needed for shortness of breath or wheezing    [provider]  lactase (LACTAID) 3000 units tablet Take 3,000 Units by mouth 3 (three) times daily with meals.    [provider]  metFORMIN (GLUCOPHAGE-XR) 500 MG 24 hr tablet Take 500 mg by mouth daily.     [provider]  methocarbamol (ROBAXIN) 500 MG tablet Take 1,000 mg by mouth in the morning and at bedtime.     [provider]  metoprolol tartrate (LOPRESSOR) 100 MG tablet Take 1 tablet (100 mg total) by mouth 2 (two) times daily. 01/22/21   Sreenath, Trula Slade, MD  mineral oil enema Place 133 mLs (1 enema total) rectally daily. 02/03/21   Elodia Florence., MD  oxybutynin (DITROPAN-XL) 10 MG 24 hr tablet Take 1 tablet (10 mg total) by mouth daily. 11/01/20   Enzo Bi, MD  pantoprazole (PROTONIX) 40 MG tablet Take 1 tablet (40 mg total) by mouth 2 (two) times daily before a meal. For 8 weeks. 11/01/20 01/30/21  Enzo Bi, MD  polyethylene glycol (MIRALAX / GLYCOLAX) 17 g packet Take 17 g by mouth daily.    [provider]  psyllium (METAMUCIL) 58.6 % powder Take 1 packet by mouth daily.    [provider]  SARNA lotion Apply topically. 02/25/20   [provider]  senna (SENOKOT) 8.6 MG TABS tablet Take 2 tablets by mouth at bedtime.    [provider]  simethicone (MYLICON) 0000000 MG chewable tablet Chew 125 mg by mouth 3 (three) times daily.    [provider]  Skin Protectants, Misc. (MINERIN CREME EX) Apply topically. Apply to feet twice bid    [provider]  torsemide  (DEMADEX) 10 MG tablet Take 10 mg by mouth daily.    [provider]  traZODone (DESYREL) 100 MG tablet Take 100 mg by mouth at bedtime.    [provider]  TRELEGY ELLIPTA 100-62.5-25 MCG/INH AEPB Inhale 1 puff into the lungs daily. 02/17/20   [provider]  triamcinolone cream (KENALOG) 0.1 % Apply 1 application topically 2 (two) times daily.    [provider]  valACYclovir (VALTREX) 1000 MG tablet Take 1 tablet (1,000 mg total) by mouth 3 (three) times daily for 10 days. 03/11/21 03/21/21  Naaman Plummer, MD   Physical Exam: Vitals:   03/16/21 1645 03/16/21 1700 03/16/21 1730 03/16/21 1800  BP:  (!) 93/57 94/74 95/80   Pulse: 76 77 82 71  Resp: (!) 22 15 16 20   Temp:      TempSrc:      SpO2: 100% 97% 98% 96%  Height:       Constitutional: appears age-appropriate, frail, NAD, calm, comfortable Eyes: PERRL, lids and conjunctivae normal ENMT: Mucous membranes are moist. Posterior pharynx clear of any exudate or lesions. Age-appropriate dentition. Hearing appropriate Neck: normal, supple, no masses, no thyromegaly Respiratory: clear to auscultation bilaterally, no wheezing, no crackles. Normal respiratory effort. No accessory muscle use.  Cardiovascular: Regular rate and rhythm, no murmurs / rubs / gallops. No extremity edema. 2+ pedal pulses. No carotid bruits.  Abdomen: Obese abdomen, no tenderness, no masses palpated, no hepatosplenomegaly. Bowel sounds positive.  Musculoskeletal: no clubbing / cyanosis. No joint deformity upper and lower extremities. Good ROM, no contractures, no atrophy. Normal muscle tone.  Skin: no rashes, lesions, ulcers. No induration Neurologic: Sensation intact. Strength 5/5 in all 4.  Psychiatric: Normal judgment and insight. Alert and oriented x 3. Normal mood.   EKG: independently reviewed, showing atrial fibrillation with rate of 82, QTc 546  Chest x-ray on Admission: I personally reviewed and I agree with  radiologist reading as below.  DG Chest Portable 1 View  Result Date: 03/16/2021 CLINICAL DATA:  Weakness and hypoxia EXAM: PORTABLE CHEST 1 VIEW COMPARISON:  Chest radiograph 01/31/2021 FINDINGS: The heart is mildly enlarged, unchanged. The mediastinal contours are stable. Mitral valve clips are again noted. Lung volumes are low. There is no focal consolidation or  pulmonary edema. There is no pleural effusion or pneumothorax. There is no acute osseous abnormality. IMPRESSION: No radiographic evidence of acute cardiopulmonary process. Electronically Signed   By: Lesia Hausen M.D.   On: 03/16/2021 16:29    Labs on Admission: I have personally reviewed following labs  CBC: Recent Labs  Lab 03/11/21 1336 03/16/21 1551  WBC 11.2* 11.3*  NEUTROABS  --  8.0*  HGB 14.4 15.2  HCT 44.3 46.1  MCV 93.7 94.1  PLT 266 287   Basic Metabolic Panel: Recent Labs  Lab 03/11/21 1336 03/16/21 1551  NA 135 138  K 3.2* 3.2*  CL 96* 100  CO2 30 28  GLUCOSE 109* 119*  BUN 32* 51*  CREATININE 1.44* 2.03*  CALCIUM 9.0 9.0   GFR: Estimated Creatinine Clearance: 29.5 mL/min (A) (by C-G formula based on SCr of 2.03 mg/dL (H)).  Liver Function Tests: Recent Labs  Lab 03/16/21 1551  AST 16  ALT 8  ALKPHOS 78  BILITOT 1.0  PROT 6.9  ALBUMIN 3.4*   Coagulation Profile: Recent Labs  Lab 03/16/21 1551  INR 2.2*   Urine analysis:    Component Value Date/Time   COLORURINE YELLOW (A) 03/16/2021 1838   APPEARANCEUR CLOUDY (A) 03/16/2021 1838   LABSPEC 1.015 03/16/2021 1838   PHURINE 5.0 03/16/2021 1838   GLUCOSEU NEGATIVE 03/16/2021 1838   HGBUR LARGE (A) 03/16/2021 1838   BILIRUBINUR NEGATIVE 03/16/2021 1838   KETONESUR 5 (A) 03/16/2021 1838   PROTEINUR NEGATIVE 03/16/2021 1838   NITRITE POSITIVE (A) 03/16/2021 1838   LEUKOCYTESUR LARGE (A) 03/16/2021 1838   CRITICAL CARE Performed by: Nadyne Coombes Lanecia Sliva  Total critical care time: 35 minutes  Critical care time was exclusive of separately  billable procedures and treating other patients.  Critical care was necessary to treat or prevent imminent or life-threatening deterioration.  Circulatory failure, severe sepsis  Critical care was time spent personally by me on the following activities: development of treatment plan with patient and/or surrogate as well as nursing, discussions with consultants, evaluation of patient's response to treatment, examination of patient, obtaining history from patient or surrogate, ordering and performing treatments and interventions, ordering and review of laboratory studies, ordering and review of radiographic studies, pulse oximetry and re-evaluation of patient's condition.  Dr. Sedalia Muta [facility]  If 7PM-7AM, please contact overnight-coverage provider If 7AM-7PM, please contact day coverage provider [email or web address]  03/16/2021, 7:39 PM

## 2021-03-16 NOTE — Consult Note (Signed)
Pharmacy Antibiotic Note  Raymond Hays is a 75 y.o. male admitted on 03/16/2021 with sepsis.  Pharmacy has been consulted for cefepime and vancomycin dosing.  Plan: - cefepime 2 gram Q24H - Vancomycin 1500 mg LD x 1  - Given AKI, will withhold scheduled vancomycin regimen at this time and re-assess tomorrow - expected regimen Q48H given current creatinine clearance    Height: 5\' 8"  (172.7 cm) IBW/kg (Calculated) : 68.4  Temp (24hrs), Avg:97.8 F (36.6 C), Min:97.8 F (36.6 C), Max:97.8 F (36.6 C)  Recent Labs  Lab 03/11/21 1336 03/16/21 1551 03/16/21 1554 03/16/21 1838  WBC 11.2* 11.3*  --   --   CREATININE 1.44* 2.03*  --   --   LATICACIDVEN  --   --  3.8* 3.9*    Estimated Creatinine Clearance: 29.5 mL/min (A) (by C-G formula based on SCr of 2.03 mg/dL (H)).    No Known Allergies  Antimicrobials this admission: 12/16 cefepime >>  12/16 vancomycin >>  12/16 metronidazole x 1   Dose adjustments this admission:   Microbiology results: 12/16 BCx: sent 12/16 UCx: sent  12/16 MRSA PCR: ordered  Thank you for allowing pharmacy to be a part of this patients care.  1/17, PharmD, BCPS Clinical Pharmacist   03/16/2021 8:00 PM

## 2021-03-16 NOTE — ED Notes (Signed)
CBG 79 

## 2021-03-17 DIAGNOSIS — J9611 Chronic respiratory failure with hypoxia: Secondary | ICD-10-CM | POA: Diagnosis present

## 2021-03-17 DIAGNOSIS — I480 Paroxysmal atrial fibrillation: Secondary | ICD-10-CM | POA: Diagnosis present

## 2021-03-17 DIAGNOSIS — B9620 Unspecified Escherichia coli [E. coli] as the cause of diseases classified elsewhere: Secondary | ICD-10-CM

## 2021-03-17 DIAGNOSIS — F32A Depression, unspecified: Secondary | ICD-10-CM | POA: Diagnosis present

## 2021-03-17 DIAGNOSIS — N179 Acute kidney failure, unspecified: Secondary | ICD-10-CM | POA: Diagnosis present

## 2021-03-17 DIAGNOSIS — N401 Enlarged prostate with lower urinary tract symptoms: Secondary | ICD-10-CM | POA: Diagnosis present

## 2021-03-17 DIAGNOSIS — N390 Urinary tract infection, site not specified: Secondary | ICD-10-CM

## 2021-03-17 DIAGNOSIS — B0231 Zoster conjunctivitis: Secondary | ICD-10-CM

## 2021-03-17 DIAGNOSIS — I482 Chronic atrial fibrillation, unspecified: Secondary | ICD-10-CM | POA: Diagnosis present

## 2021-03-17 DIAGNOSIS — A4151 Sepsis due to Escherichia coli [E. coli]: Secondary | ICD-10-CM | POA: Diagnosis present

## 2021-03-17 DIAGNOSIS — K560 Paralytic ileus: Secondary | ICD-10-CM | POA: Diagnosis not present

## 2021-03-17 DIAGNOSIS — Z1612 Extended spectrum beta lactamase (ESBL) resistance: Secondary | ICD-10-CM | POA: Diagnosis present

## 2021-03-17 DIAGNOSIS — E1140 Type 2 diabetes mellitus with diabetic neuropathy, unspecified: Secondary | ICD-10-CM | POA: Diagnosis present

## 2021-03-17 DIAGNOSIS — D509 Iron deficiency anemia, unspecified: Secondary | ICD-10-CM | POA: Diagnosis present

## 2021-03-17 DIAGNOSIS — E785 Hyperlipidemia, unspecified: Secondary | ICD-10-CM | POA: Diagnosis present

## 2021-03-17 DIAGNOSIS — R6521 Severe sepsis with septic shock: Secondary | ICD-10-CM | POA: Diagnosis present

## 2021-03-17 DIAGNOSIS — A419 Sepsis, unspecified organism: Secondary | ICD-10-CM | POA: Diagnosis not present

## 2021-03-17 DIAGNOSIS — R4182 Altered mental status, unspecified: Secondary | ICD-10-CM | POA: Diagnosis present

## 2021-03-17 DIAGNOSIS — T83511A Infection and inflammatory reaction due to indwelling urethral catheter, initial encounter: Secondary | ICD-10-CM | POA: Diagnosis present

## 2021-03-17 DIAGNOSIS — K5900 Constipation, unspecified: Secondary | ICD-10-CM | POA: Diagnosis not present

## 2021-03-17 DIAGNOSIS — G4700 Insomnia, unspecified: Secondary | ICD-10-CM | POA: Diagnosis present

## 2021-03-17 DIAGNOSIS — F419 Anxiety disorder, unspecified: Secondary | ICD-10-CM | POA: Diagnosis present

## 2021-03-17 DIAGNOSIS — I10 Essential (primary) hypertension: Secondary | ICD-10-CM | POA: Diagnosis present

## 2021-03-17 DIAGNOSIS — Z7901 Long term (current) use of anticoagulants: Secondary | ICD-10-CM | POA: Diagnosis not present

## 2021-03-17 DIAGNOSIS — J449 Chronic obstructive pulmonary disease, unspecified: Secondary | ICD-10-CM | POA: Diagnosis present

## 2021-03-17 DIAGNOSIS — Z66 Do not resuscitate: Secondary | ICD-10-CM | POA: Diagnosis not present

## 2021-03-17 DIAGNOSIS — J111 Influenza due to unidentified influenza virus with other respiratory manifestations: Secondary | ICD-10-CM | POA: Diagnosis not present

## 2021-03-17 DIAGNOSIS — Z20822 Contact with and (suspected) exposure to covid-19: Secondary | ICD-10-CM | POA: Diagnosis present

## 2021-03-17 LAB — PROTIME-INR
INR: 2.1 — ABNORMAL HIGH (ref 0.8–1.2)
Prothrombin Time: 23.3 seconds — ABNORMAL HIGH (ref 11.4–15.2)

## 2021-03-17 LAB — CBC WITH DIFFERENTIAL/PLATELET
Abs Immature Granulocytes: 0.1 10*3/uL — ABNORMAL HIGH (ref 0.00–0.07)
Basophils Absolute: 0.1 10*3/uL (ref 0.0–0.1)
Basophils Relative: 1 %
Eosinophils Absolute: 0.3 10*3/uL (ref 0.0–0.5)
Eosinophils Relative: 3 %
HCT: 40.8 % (ref 39.0–52.0)
Hemoglobin: 13.5 g/dL (ref 13.0–17.0)
Immature Granulocytes: 1 %
Lymphocytes Relative: 19 %
Lymphs Abs: 1.8 10*3/uL (ref 0.7–4.0)
MCH: 30.5 pg (ref 26.0–34.0)
MCHC: 33.1 g/dL (ref 30.0–36.0)
MCV: 92.3 fL (ref 80.0–100.0)
Monocytes Absolute: 0.7 10*3/uL (ref 0.1–1.0)
Monocytes Relative: 7 %
Neutro Abs: 6.8 10*3/uL (ref 1.7–7.7)
Neutrophils Relative %: 69 %
Platelets: 220 10*3/uL (ref 150–400)
RBC: 4.42 MIL/uL (ref 4.22–5.81)
RDW: 15.1 % (ref 11.5–15.5)
WBC: 9.9 10*3/uL (ref 4.0–10.5)
nRBC: 0.2 % (ref 0.0–0.2)

## 2021-03-17 LAB — GLUCOSE, CAPILLARY
Glucose-Capillary: 91 mg/dL (ref 70–99)
Glucose-Capillary: 99 mg/dL (ref 70–99)
Glucose-Capillary: 131 mg/dL — ABNORMAL HIGH (ref 70–99)

## 2021-03-17 LAB — BASIC METABOLIC PANEL
Anion gap: 10 (ref 5–15)
BUN: 42 mg/dL — ABNORMAL HIGH (ref 8–23)
CO2: 27 mmol/L (ref 22–32)
Calcium: 8.4 mg/dL — ABNORMAL LOW (ref 8.9–10.3)
Chloride: 107 mmol/L (ref 98–111)
Creatinine, Ser: 1.44 mg/dL — ABNORMAL HIGH (ref 0.61–1.24)
GFR, Estimated: 50 mL/min — ABNORMAL LOW (ref >60)
Glucose, Bld: 90 mg/dL (ref 70–99)
Potassium: 3.2 mmol/L — ABNORMAL LOW (ref 3.5–5.1)
Sodium: 144 mmol/L (ref 135–145)

## 2021-03-17 LAB — MAGNESIUM: Magnesium: 1.4 mg/dL — ABNORMAL LOW (ref 1.7–2.4)

## 2021-03-17 LAB — CORTISOL-AM, BLOOD: Cortisol - AM: 18.5 ug/dL (ref 6.7–22.6)

## 2021-03-17 LAB — PHOSPHORUS: Phosphorus: 2.9 mg/dL (ref 2.5–4.6)

## 2021-03-17 LAB — D-DIMER, QUANTITATIVE: D-Dimer, Quant: <0.27 ug/mL-FEU (ref 0.00–0.50)

## 2021-03-17 LAB — FIBRINOGEN: Fibrinogen: 268 mg/dL (ref 210–475)

## 2021-03-17 LAB — CBG MONITORING, ED: Glucose-Capillary: 77 mg/dL (ref 70–99)

## 2021-03-17 MED ORDER — TRAZODONE HCL 100 MG PO TABS
100.0000 mg | ORAL_TABLET | Freq: Every evening | ORAL | Status: DC | PRN
  Administered 2021-03-18 – 2021-03-23 (×3): 100 mg via ORAL
  Filled 2021-03-17 (×3): qty 1

## 2021-03-17 MED ORDER — MAGNESIUM SULFATE 4 GM/100ML IV SOLN
4.0000 g | Freq: Once | INTRAVENOUS | Status: AC
  Administered 2021-03-17: 4 g via INTRAVENOUS
  Filled 2021-03-17: qty 100

## 2021-03-17 MED ORDER — SODIUM CHLORIDE 0.9 % IV SOLN
2.0000 g | Freq: Two times a day (BID) | INTRAVENOUS | Status: AC
  Administered 2021-03-17 – 2021-03-18 (×4): 2 g via INTRAVENOUS
  Filled 2021-03-17 (×4): qty 2

## 2021-03-17 MED ORDER — DEXTROSE 50 % IV SOLN: 1.0000 | INTRAVENOUS | Status: AC | PRN

## 2021-03-17 MED ORDER — PREDNISOLONE ACETATE 1 % OP SUSP
1.0000 [drp] | Freq: Four times a day (QID) | OPHTHALMIC | Status: DC
  Administered 2021-03-17 – 2021-03-26 (×37): 1 [drp] via OPHTHALMIC
  Filled 2021-03-17 (×2): qty 1

## 2021-03-17 MED ORDER — SODIUM CHLORIDE 0.9 % IV SOLN: Freq: Once | INTRAVENOUS | Status: AC

## 2021-03-17 MED ORDER — POTASSIUM CHLORIDE CRYS ER 20 MEQ PO TBCR
40.0000 meq | EXTENDED_RELEASE_TABLET | ORAL | Status: AC
  Administered 2021-03-17 (×2): 40 meq via ORAL
  Filled 2021-03-17 (×2): qty 2

## 2021-03-17 MED ORDER — UMECLIDINIUM BROMIDE 62.5 MCG/ACT IN AEPB
1.0000 | INHALATION_SPRAY | Freq: Every day | RESPIRATORY_TRACT | Status: DC
  Administered 2021-03-18 – 2021-03-26 (×9): 1 via RESPIRATORY_TRACT
  Filled 2021-03-17 (×2): qty 7

## 2021-03-17 MED ORDER — FLUTICASONE FUROATE-VILANTEROL 100-25 MCG/ACT IN AEPB
1.0000 | INHALATION_SPRAY | Freq: Every day | RESPIRATORY_TRACT | Status: DC
  Administered 2021-03-18 – 2021-03-26 (×9): 1 via RESPIRATORY_TRACT
  Filled 2021-03-17: qty 28

## 2021-03-17 MED ORDER — METRONIDAZOLE 500 MG/100ML IV SOLN: 500.0000 mg | Freq: Two times a day (BID) | INTRAVENOUS | Status: DC

## 2021-03-17 NOTE — ED Notes (Signed)
Pt brief changed. Pt brief soaked with urine and pt had small bm in brief. PT wiped and changed, sacral pad placed on pt bottom. Pt repositioned in bed. Pt resting in bed denies needs. Pt refusing to eat lunch, "I dont like the chicken with the goopy stuff on top."

## 2021-03-17 NOTE — Progress Notes (Addendum)
[facility]  - [place] at [facility]   PATIENT NAME: Raymond Hays    MR#:  [id number]  DATE OF BIRTH:  1945/07/03  SUBJECTIVE:  patient seen earlier in the ER. No family at bedside. Patient complains of itching and pain in both his eyes since after he was been diagnosed with her for zoster when I entered the ER room had significant pain. Has been on antiviral meds for last week. Came in with overall declining condition. Found to have abnormal UA. Has a chronic Foley catheter.  No fever.  REVIEW OF SYSTEMS:   Review of Systems  Constitutional:  Negative for chills, fever and weight loss.  HENT:  Negative for ear discharge, ear pain and nosebleeds.   Eyes:  Positive for blurred vision, pain and discharge.  Respiratory:  Negative for sputum production, shortness of breath, wheezing and stridor.   Cardiovascular:  Negative for chest pain, palpitations, orthopnea and PND.  Gastrointestinal:  Negative for abdominal pain, diarrhea, nausea and vomiting.  Genitourinary:  Negative for frequency and urgency.  Musculoskeletal:  Negative for back pain and joint pain.  Neurological:  Positive for weakness. Negative for sensory change, speech change and focal weakness.  Psychiatric/Behavioral:  Negative for depression and hallucinations. The patient is not nervous/anxious.   Tolerating Diet: Tolerating PT: bed bound  DRUG ALLERGIES:  No Known Allergies  VITALS:  Blood pressure 111/76, pulse (!) 103, temperature 97.9 F (36.6 C), resp. rate 18, height 5\' 8"  (1.727 m), SpO2 92 %.  PHYSICAL EXAMINATION:   Physical Exam  GENERAL:  75 y.o.-year-old patient lying in the bed with no acute distress. Chronically ill HEENT: Head atraumatic, normocephalic. Keeps both eyes closed. Patient has zoster lesions which are scabbed on left forehead. LUNGS: Normal breath sounds bilaterally, no wheezing, rales, rhonchi. No use of accessory muscles of respiration.  CARDIOVASCULAR: S1, S2 normal.  No murmurs, rubs, or gallops.  ABDOMEN: Soft, nontender, nondistended. Bowel sounds present. No organomegaly or mass. Chronic Foley+ EXTREMITIES: chronic bilateral lower extremity edema with chronic ulcers NEUROLOGIC: nonfocal PSYCHIATRIC:  patient is alert and oriented x 3.  SKIN: per RN  LABORATORY PANEL:  CBC Recent Labs  Lab 03/17/21 0608  WBC 9.9  HGB 13.5  HCT 40.8  PLT 220    Chemistries  Recent Labs  Lab 03/16/21 1551 03/17/21 0608  NA 138 144  K 3.2* 3.2*  CL 100 107  CO2 28 27  GLUCOSE 119* 90  BUN 51* 42*  CREATININE 2.03* 1.44*  CALCIUM 9.0 8.4*  MG  --  1.4*  AST 16  --   ALT 8  --   ALKPHOS 78  --   BILITOT 1.0  --    Cardiac Enzymes No results for input(s): TROPONINI in the last 168 hours. RADIOLOGY:  DG Chest Portable 1 View  Result Date: 03/16/2021 CLINICAL DATA:  Weakness and hypoxia EXAM: PORTABLE CHEST 1 VIEW COMPARISON:  Chest radiograph 01/31/2021 FINDINGS: The heart is mildly enlarged, unchanged. The mediastinal contours are stable. Mitral valve clips are again noted. Lung volumes are low. There is no focal consolidation or pulmonary edema. There is no pleural effusion or pneumothorax. There is no acute osseous abnormality. IMPRESSION: No radiographic evidence of acute cardiopulmonary process. Electronically Signed   By: 13/05/2020 M.D.   On: 03/16/2021 16:29   ASSESSMENT AND PLAN:   Terion Hedman is a 75 y.o. male with medical history significant for COPD, non-insulin-dependent diabetes mellitus, hypertension, history of hyperlipidemia, paroxysmal atrial fibrillation, osteoarthritis,  history of spinal stenosis, on anticoagulation with eliquis, urinary retention, chronic indwelling Foley catheter, who presents emergency department for chief concerns of generalized weakness and health decline for 3 days.  # Severe sepsis with septic shock suspected due to UTI with h/o chronic indwelling foley -pt came in with Hypotension, responsive to IVF, altered  mental status, increased lactic acid, elevated WBC, source of urine, organ involvement including renal - started on midodrine 10 mg -  procalcitonin  negative - MRSA PCR - Urine cultures pending - Blood cultures negative - Continue with cefepime and vancomycin per pharmacy--cont cefepime - Lactic acid 3.9--3.9  # Acute kidney injury-secondary to the above number, treat as above - Strict I's and O's --baseline creat 0.8 --came in with creat 2.03--1.44   # Paroxysmal atrial fibrillation-resumed home Eliquis - will resume amiodarone, metoprolol tartrate once stable   # Non-insulin-dependent diabetes mellitus - holding home metformin  - Insulin SSI with at bedtime coverage - Goal inpatient glucose level is 140-180   # Tremors-resumed carbidopa-levodopa - Patient states he has never been diagnosed with parkinsons    # 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 3 times daily --d/w Dr Druscilla Brownie-- ok to use prednisolone 1% ED tid both eyes for possible iritis. --pt needs to follow up with Dr Inez Pilgrim   # Depression/anxiety-escitalopram    # Neuropathy- gabapentin  Family communication :spoke with Brother in law Consults : CODE STATUS: FULL DVT Prophylaxis :eliquis Level of care: Progressive Status is: inpatient  Admitted with sepsis     TOTAL TIME TAKING CARE OF THIS PATIENT: 35 minutes.  >50% time spent on counselling and coordination of care  Note: This dictation was prepared with Dragon dictation along with smaller phrase technology. Any transcriptional errors that result from this process are unintentional.  Enedina Finner M.D    [facility]   CC: Primary care physician; [facility] Patient ID: Delford Wingert, male   DOB: Dec 24, 1945, 75 y.o.   MRN: [id number]

## 2021-03-18 DIAGNOSIS — R6521 Severe sepsis with septic shock: Secondary | ICD-10-CM | POA: Diagnosis not present

## 2021-03-18 DIAGNOSIS — A419 Sepsis, unspecified organism: Secondary | ICD-10-CM | POA: Diagnosis not present

## 2021-03-18 DIAGNOSIS — N390 Urinary tract infection, site not specified: Secondary | ICD-10-CM

## 2021-03-18 DIAGNOSIS — B0231 Zoster conjunctivitis: Secondary | ICD-10-CM

## 2021-03-18 LAB — BASIC METABOLIC PANEL
Anion gap: 8 (ref 5–15)
BUN: 33 mg/dL — ABNORMAL HIGH (ref 8–23)
CO2: 21 mmol/L — ABNORMAL LOW (ref 22–32)
Calcium: 8.5 mg/dL — ABNORMAL LOW (ref 8.9–10.3)
Chloride: 111 mmol/L (ref 98–111)
Creatinine, Ser: 1.09 mg/dL (ref 0.61–1.24)
GFR, Estimated: >60 mL/min (ref >60)
Glucose, Bld: 102 mg/dL — ABNORMAL HIGH (ref 70–99)
Potassium: 3.9 mmol/L (ref 3.5–5.1)
Sodium: 140 mmol/L (ref 135–145)

## 2021-03-18 LAB — GLUCOSE, CAPILLARY
Glucose-Capillary: 97 mg/dL (ref 70–99)
Glucose-Capillary: 123 mg/dL — ABNORMAL HIGH (ref 70–99)
Glucose-Capillary: 128 mg/dL — ABNORMAL HIGH (ref 70–99)
Glucose-Capillary: 103 mg/dL — ABNORMAL HIGH (ref 70–99)

## 2021-03-18 LAB — MAGNESIUM: Magnesium: 2.3 mg/dL (ref 1.7–2.4)

## 2021-03-18 MED ORDER — FINASTERIDE 5 MG PO TABS
5.0000 mg | ORAL_TABLET | Freq: Every day | ORAL | Status: DC
  Administered 2021-03-19 – 2021-03-26 (×8): 5 mg via ORAL
  Filled 2021-03-18 (×8): qty 1

## 2021-03-18 MED ORDER — SODIUM CHLORIDE 0.9 % IV SOLN: INTRAVENOUS | Status: DC | PRN

## 2021-03-18 MED ORDER — AMIODARONE HCL 200 MG PO TABS
100.0000 mg | ORAL_TABLET | Freq: Every day | ORAL | Status: DC
  Administered 2021-03-18 – 2021-03-19 (×2): 100 mg via ORAL
  Filled 2021-03-18 (×2): qty 1

## 2021-03-18 MED ORDER — CEFTRIAXONE SODIUM 2 G IJ SOLR
2.0000 g | INTRAMUSCULAR | Status: DC
  Administered 2021-03-19: 14:00:00 2 g via INTRAVENOUS
  Filled 2021-03-18 (×3): qty 20

## 2021-03-18 MED ORDER — CALCIUM CARBONATE ANTACID 500 MG PO CHEW
2.0000 | CHEWABLE_TABLET | Freq: Four times a day (QID) | ORAL | Status: DC | PRN
  Administered 2021-03-20: 15:00:00 400 mg via ORAL
  Filled 2021-03-18: qty 2

## 2021-03-18 MED ORDER — ATORVASTATIN CALCIUM 20 MG PO TABS
20.0000 mg | ORAL_TABLET | Freq: Every day | ORAL | Status: DC
  Administered 2021-03-18 – 2021-03-25 (×8): 20 mg via ORAL
  Filled 2021-03-18 (×8): qty 1

## 2021-03-18 MED ORDER — GABAPENTIN 400 MG PO CAPS
800.0000 mg | ORAL_CAPSULE | Freq: Two times a day (BID) | ORAL | Status: DC
  Administered 2021-03-18 – 2021-03-26 (×17): 800 mg via ORAL
  Filled 2021-03-18 (×17): qty 2

## 2021-03-18 NOTE — Progress Notes (Signed)
CCMD reported [t's HR 1-teens-120's, pt resting comfortably. MD made aware/see [facility] for new orders.

## 2021-03-18 NOTE — Progress Notes (Signed)
[facility]  - [place] at [facility]   PATIENT NAME: Raymond Hays    MR#:  [id number]  DATE OF BIRTH:  04-23-1945  SUBJECTIVE:  patient seen earlier in the ER. No family at bedside. Patient complains of itching and pain in both his eyes since after he was been diagnosed with her for zoster when I entered the ER room had significant pain. Has been on antiviral meds for last week. Came in with overall declining condition. Found to have abnormal UA. Has a chronic Foley catheter.  Patient keeps his eyes closed. My conversation. Overall looks better. No family at bedside.  REVIEW OF SYSTEMS:   Review of Systems  Constitutional:  Negative for chills, fever and weight loss.  HENT:  Negative for ear discharge, ear pain and nosebleeds.   Eyes:  Positive for blurred vision, pain and discharge.  Respiratory:  Negative for sputum production, shortness of breath, wheezing and stridor.   Cardiovascular:  Negative for chest pain, palpitations, orthopnea and PND.  Gastrointestinal:  Negative for abdominal pain, diarrhea, nausea and vomiting.  Genitourinary:  Negative for frequency and urgency.  Musculoskeletal:  Negative for back pain and joint pain.  Neurological:  Positive for weakness. Negative for sensory change, speech change and focal weakness.  Psychiatric/Behavioral:  Negative for depression and hallucinations. The patient is not nervous/anxious.   Tolerating Diet: Tolerating PT: bed bound  DRUG ALLERGIES:  No Known Allergies  VITALS:  Blood pressure 116/82, pulse (!) 105, temperature 97.9 F (36.6 C), resp. rate 18, height 5\' 8"  (1.727 m), SpO2 98 %.  PHYSICAL EXAMINATION:   Physical Exam  GENERAL:  75 y.o.-year-old patient lying in the bed with no acute distress. Chronically ill HEENT: Head atraumatic, normocephalic. Keeps both eyes closed. Patient has zoster lesions which are scabbed on left forehead. LUNGS: Normal breath sounds bilaterally, no wheezing, rales,  rhonchi. No use of accessory muscles of respiration.  CARDIOVASCULAR: S1, S2 normal. No murmurs, rubs, or gallops. Tachycardia ABDOMEN: Soft, nontender, nondistended. Bowel sounds present. No organomegaly or mass. Chronic Foley+ EXTREMITIES: chronic bilateral lower extremity edema with chronic ulcers NEUROLOGIC: nonfocal PSYCHIATRIC:  patient is alert and oriented x 3.  SKIN: per RN  LABORATORY PANEL:  CBC Recent Labs  Lab 03/17/21 0608  WBC 9.9  HGB 13.5  HCT 40.8  PLT 220     Chemistries  Recent Labs  Lab 03/16/21 1551 03/17/21 0608 03/18/21 0454  NA 138   < > 140  K 3.2*   < > 3.9  CL 100   < > 111  CO2 28   < > 21*  GLUCOSE 119*   < > 102*  BUN 51*   < > 33*  CREATININE 2.03*   < > 1.09  CALCIUM 9.0   < > 8.5*  MG  --    < > 2.3  AST 16  --   --   ALT 8  --   --   ALKPHOS 78  --   --   BILITOT 1.0  --   --    < > = values in this interval not displayed.    Cardiac Enzymes No results for input(s): TROPONINI in the last 168 hours. RADIOLOGY:  DG Chest Portable 1 View  Result Date: 03/16/2021 CLINICAL DATA:  Weakness and hypoxia EXAM: PORTABLE CHEST 1 VIEW COMPARISON:  Chest radiograph 01/31/2021 FINDINGS: The heart is mildly enlarged, unchanged. The mediastinal contours are stable. Mitral valve clips are again noted. Lung volumes are  low. There is no focal consolidation or pulmonary edema. There is no pleural effusion or pneumothorax. There is no acute osseous abnormality. IMPRESSION: No radiographic evidence of acute cardiopulmonary process. Electronically Signed   By: Lesia Hausen M.D.   On: 03/16/2021 16:29   ASSESSMENT AND PLAN:   Barnett Elzey is a 75 y.o. male with medical history significant for COPD, non-insulin-dependent diabetes mellitus, hypertension, history of hyperlipidemia, paroxysmal atrial fibrillation, osteoarthritis, history of spinal stenosis, on anticoagulation with eliquis, urinary retention, chronic indwelling Foley catheter, who presents  emergency department for chief concerns of generalized weakness and health decline for 3 days.  # Severe sepsis with septic shock suspected due to UTI with h/o chronic indwelling foley -pt came in with Hypotension, responsive to IVF, altered mental status, increased lactic acid, elevated WBC, source of urine, organ involvement including renal - started on midodrine 10 mg -  procalcitonin  negative - MRSA PCR - Urine cultures E. coli. - Blood cultures negative - Continue with cefepime and vancomycin per pharmacy--cont cefepime-- change to Rocephin - Lactic acid 3.9--3.9  # Acute kidney injury-secondary to the above number, treat as above - Strict I's and O's --baseline creat 0.8 --came in with creat 2.03--1.44   # Paroxysmal atrial fibrillation-resumed home Eliquis - will resume amiodarone   # Non-insulin-dependent diabetes mellitus - holding home metformin  - Insulin SSI with at bedtime coverage - Goal inpatient glucose level is 140-180   # Tremors-resumed carbidopa-levodopa - Patient states he has never been diagnosed with parkinsons    # 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 3 times daily --d/w Dr Druscilla Brownie-- ok to use prednisolone 1% ED tid both eyes for possible iritis. --pt needs to follow up with Dr Inez Pilgrim   # Depression/anxiety-escitalopram    # Neuropathy- gabapentin  Family communication :spoke with Brother in law 12/18 Consults : CODE STATUS: FULL DVT Prophylaxis :eliquis Level of care: Telemetry Medical Status is: inpatient  Admitted with sepsis     TOTAL TIME TAKING CARE OF THIS PATIENT: 30 minutes.  >50% time spent on counselling and coordination of care  Note: This dictation was prepared with Dragon dictation along with smaller phrase technology. Any transcriptional errors that result from this process are unintentional.  Enedina Finner M.D    [facility]   CC: Primary  care physician; [facility] Patient ID: Gjon Letarte, male   DOB: [birth year], 75 y.o.   MRN: [id number]

## 2021-03-19 ENCOUNTER — Inpatient Hospital Stay: Payer: No Typology Code available for payment source

## 2021-03-19 DIAGNOSIS — K5900 Constipation, unspecified: Secondary | ICD-10-CM | POA: Diagnosis not present

## 2021-03-19 DIAGNOSIS — B0231 Zoster conjunctivitis: Secondary | ICD-10-CM | POA: Diagnosis not present

## 2021-03-19 DIAGNOSIS — K560 Paralytic ileus: Secondary | ICD-10-CM | POA: Diagnosis not present

## 2021-03-19 DIAGNOSIS — I480 Paroxysmal atrial fibrillation: Secondary | ICD-10-CM | POA: Diagnosis not present

## 2021-03-19 LAB — CBC WITH DIFFERENTIAL/PLATELET
Abs Immature Granulocytes: 0.11 10*3/uL — ABNORMAL HIGH (ref 0.00–0.07)
Basophils Absolute: 0.1 10*3/uL (ref 0.0–0.1)
Basophils Relative: 1 %
Eosinophils Absolute: 0.2 10*3/uL (ref 0.0–0.5)
Eosinophils Relative: 2 %
HCT: 38.8 % — ABNORMAL LOW (ref 39.0–52.0)
Hemoglobin: 12.8 g/dL — ABNORMAL LOW (ref 13.0–17.0)
Immature Granulocytes: 1 %
Lymphocytes Relative: 17 %
Lymphs Abs: 1.6 10*3/uL (ref 0.7–4.0)
MCH: 31.1 pg (ref 26.0–34.0)
MCHC: 33 g/dL (ref 30.0–36.0)
MCV: 94.4 fL (ref 80.0–100.0)
Monocytes Absolute: 0.7 10*3/uL (ref 0.1–1.0)
Monocytes Relative: 8 %
Neutro Abs: 6.6 10*3/uL (ref 1.7–7.7)
Neutrophils Relative %: 71 %
Platelets: 220 10*3/uL (ref 150–400)
RBC: 4.11 MIL/uL — ABNORMAL LOW (ref 4.22–5.81)
RDW: 15.3 % (ref 11.5–15.5)
WBC: 9.4 10*3/uL (ref 4.0–10.5)
nRBC: 0.2 % (ref 0.0–0.2)

## 2021-03-19 LAB — URINE CULTURE: Culture: 80000 — AB

## 2021-03-19 LAB — BASIC METABOLIC PANEL
Anion gap: 7 (ref 5–15)
BUN: 28 mg/dL — ABNORMAL HIGH (ref 8–23)
CO2: 22 mmol/L (ref 22–32)
Calcium: 8.6 mg/dL — ABNORMAL LOW (ref 8.9–10.3)
Chloride: 112 mmol/L — ABNORMAL HIGH (ref 98–111)
Creatinine, Ser: 1.04 mg/dL (ref 0.61–1.24)
GFR, Estimated: >60 mL/min (ref >60)
Glucose, Bld: 123 mg/dL — ABNORMAL HIGH (ref 70–99)
Potassium: 3.5 mmol/L (ref 3.5–5.1)
Sodium: 141 mmol/L (ref 135–145)

## 2021-03-19 LAB — GLUCOSE, CAPILLARY
Glucose-Capillary: 112 mg/dL — ABNORMAL HIGH (ref 70–99)
Glucose-Capillary: 159 mg/dL — ABNORMAL HIGH (ref 70–99)
Glucose-Capillary: 151 mg/dL — ABNORMAL HIGH (ref 70–99)
Glucose-Capillary: 94 mg/dL (ref 70–99)

## 2021-03-19 LAB — HEPATIC FUNCTION PANEL
ALT: <5 U/L (ref 0–44)
AST: 13 U/L — ABNORMAL LOW (ref 15–41)
Albumin: 2.7 g/dL — ABNORMAL LOW (ref 3.5–5.0)
Alkaline Phosphatase: 72 U/L (ref 38–126)
Bilirubin, Direct: 0.2 mg/dL (ref 0.0–0.2)
Indirect Bilirubin: 0.7 mg/dL (ref 0.3–0.9)
Total Bilirubin: 0.9 mg/dL (ref 0.3–1.2)
Total Protein: 5.5 g/dL — ABNORMAL LOW (ref 6.5–8.1)

## 2021-03-19 LAB — HEMOGLOBIN A1C
Hgb A1c MFr Bld: 6.8 % — ABNORMAL HIGH (ref 4.8–5.6)
Mean Plasma Glucose: 148 mg/dL

## 2021-03-19 MED ORDER — BISACODYL 10 MG RE SUPP
10.0000 mg | Freq: Every day | RECTAL | Status: DC
  Administered 2021-03-19 – 2021-03-20 (×2): 10 mg via RECTAL
  Filled 2021-03-19 (×3): qty 1

## 2021-03-19 MED ORDER — MIDODRINE HCL 5 MG PO TABS
5.0000 mg | ORAL_TABLET | Freq: Three times a day (TID) | ORAL | Status: DC
  Administered 2021-03-19 – 2021-03-22 (×9): 5 mg via ORAL
  Filled 2021-03-19 (×8): qty 1

## 2021-03-19 MED ORDER — LACTULOSE 10 GM/15ML PO SOLN
20.0000 g | Freq: Three times a day (TID) | ORAL | Status: DC
  Administered 2021-03-19 – 2021-03-20 (×4): 20 g via ORAL
  Filled 2021-03-19 (×6): qty 30

## 2021-03-19 MED ORDER — AMIODARONE HCL IN DEXTROSE 360-4.14 MG/200ML-% IV SOLN
60.0000 mg/h | INTRAVENOUS | Status: DC
  Administered 2021-03-19: 15:00:00 60 mg/h via INTRAVENOUS
  Filled 2021-03-19 (×2): qty 200

## 2021-03-19 MED ORDER — METOPROLOL TARTRATE 5 MG/5ML IV SOLN
5.0000 mg | INTRAVENOUS | Status: DC | PRN
  Administered 2021-03-19 – 2021-03-20 (×2): 5 mg via INTRAVENOUS
  Filled 2021-03-19 (×2): qty 5

## 2021-03-19 MED ORDER — FLEET ENEMA 7-19 GM/118ML RE ENEM
1.0000 | ENEMA | Freq: Once | RECTAL | Status: AC
  Administered 2021-03-19: 17:00:00 1 via RECTAL

## 2021-03-19 MED ORDER — POTASSIUM CHLORIDE CRYS ER 20 MEQ PO TBCR
40.0000 meq | EXTENDED_RELEASE_TABLET | Freq: Once | ORAL | Status: AC
  Administered 2021-03-19: 15:00:00 40 meq via ORAL

## 2021-03-19 MED ORDER — AMIODARONE HCL IN DEXTROSE 360-4.14 MG/200ML-% IV SOLN
30.0000 mg/h | INTRAVENOUS | Status: DC
  Administered 2021-03-19 – 2021-03-23 (×8): 30 mg/h via INTRAVENOUS
  Filled 2021-03-19 (×7): qty 200

## 2021-03-19 NOTE — Consult Note (Signed)
MEDICATION RELATED CONSULT NOTE - INITIAL   Pharmacy Consult for Amiodarone DDI monitoring Indication: Afib  No Known Allergies  Patient Measurements: Height: 5\' 8"  (172.7 cm) IBW/kg (Calculated) : 68.4  Vital Signs: Temp: 97.9 F (36.6 C) (12/19 1127) Temp Source: Axillary (12/19 0500) BP: 101/83 (12/19 1127) Pulse Rate: 160 (12/19 1127)   Labs: Recent Labs    03/16/21 1551 03/17/21 0608 03/18/21 0454 03/19/21 0557  WBC 11.3* 9.9  --  9.4  HGB 15.2 13.5  --  12.8*  HCT 46.1 40.8  --  38.8*  PLT 287 220  --  220  APTT 39*  --   --   --   CREATININE 2.03* 1.44* 1.09 1.04  MG  --  1.4* 2.3  --   PHOS  --  2.9  --   --   ALBUMIN 3.4*  --   --  2.7*  PROT 6.9  --   --  5.5*  AST 16  --   --  13*  ALT 8  --   --  <5  ALKPHOS 78  --   --  72  BILITOT 1.0  --   --  0.9  BILIDIR  --   --   --  0.2  IBILI  --   --   --  0.7   Estimated Creatinine Clearance: 57.5 mL/min (by C-G formula based on SCr of 1.04 mg/dL).  Medical History: Past Medical History:  Diagnosis Date   Allergic rhinitis    Anxiety    Arthritis    Chronic indwelling Foley catheter    COPD (chronic obstructive pulmonary disease) (HCC)    Cough    Diabetes (HCC)    Essential (primary) hypertension    Hemorrhoid    Hyperlipidemia    PAF (paroxysmal atrial fibrillation) (HCC)    Primary osteoarthritis, unspecified shoulder    Retention of urine, unspecified    Spinal stenosis    UTI (urinary tract infection)     Medications:    Assessment: 75yo Male w/ h/o COPD, niDDM, HTN, HLD, parAFib (on eliquis), OA, urinary retention (chronic indwelling cath), presenting with CC of generalized weakness and health decline x3 days. Pt admitted with septic shock 2/2 UTI (Ecoli).  Amiodarone initially held on admission/ED visit in response to prolonged Qtc, but Afib with HR of 125bpm restarted per cardiology. Pharmacy consulted for DDI assessment.  Goal of Therapy:  Monitor for new/existing DDI's   Plan:   PTA Amiodarone PO 100mg  QD held; currently starting with IV loading dose.  Zofran 4mg  PO/IV (1 dose/24hrs) -- Additive Qtc prolongation. No adjustment necessary & utilization acceptably low (dose/freq dependent). Can consider Qtc sparing alternatives for N/V indication.  Lexapro 5mg  -- additive QTC prolongation. No adjustment necessary, dose appropriate. Eliquis effects can be inc'd w/ concomitant Amio. No adjustment req'd, pt stable on both meds at intake. CTM CBC.  75yo, PharmD, BCCP Clinical Pharmacist 03/19/2021,1:30 PM

## 2021-03-19 NOTE — TOC Initial Note (Signed)
Transition of Care [facility]) - Initial/Assessment Note    Patient Details  Name: Isac Lincks MRN: [id number] Date of Birth: Feb 18, 1946  Transition of Care [facility]) CM/SW Contact:    Gildardo Griffes, LCSW Phone Number: 03/19/2021, 10:33 AM  Clinical Narrative:                  CSW notes per Gavin Pound at [facility] patient is a long term resident there under the [facility]. Per Gavin Pound just inform her when patient is ready to return, no prior auth needed.   TOC will continue to follow for discharge readiness back to [facility]    Expected Discharge Plan: Skilled Nursing Facility Barriers to Discharge: Continued Medical Work up   Patient Goals and CMS Choice Patient states their goals for this hospitalization and ongoing recovery are:: to go home CMS Medicare.gov Compare Post Acute Care list provided to:: Patient Choice offered to / list presented to : Patient  Expected Discharge Plan and Services Expected Discharge Plan: Skilled Nursing Facility       Living arrangements for the past 2 months: Skilled Nursing Facility [facility]                                      Prior Living Arrangements/Services Living arrangements for the past 2 months: Skilled Nursing Facility ([facility]) Lives with:: Facility Resident                   Activities of Daily Living      Permission Sought/Granted                  Emotional Assessment         Alcohol / Substance Use: Not Applicable Psych Involvement: No (comment)  Admission diagnosis:  AKI (acute kidney injury) (HCC) [N17.9] Elevated lactic acid level [R79.89] Sepsis (HCC) [A41.9] Hypotension, unspecified hypotension type [I95.9] Patient Active Problem List   Diagnosis Date Noted   E. coli UTI    Herpes zoster conjunctivitis of left eye    Severe sepsis with septic shock (HCC) 03/16/2021   Obstipation 01/30/2021   Anxiety 01/30/2021   Iron deficiency anemia    Edema leg     Respiratory failure (HCC) 01/09/2021   Ileus (HCC)    Neck pain    AKI (acute kidney injury) (HCC)    Type 2 diabetes mellitus with diabetic neuropathy, without long-term current use of insulin (HCC)    Parkinson disease (HCC)    Sepsis (HCC) 10/16/2020   Chronic respiratory failure with hypoxia (HCC) 01/10/2020   Impaired functional mobility, balance, gait, and endurance 09/15/2019   Catheter cystitis (HCC) 10/16/2018   COVID-19 virus detected 07/03/2018   Benign prostatic hyperplasia with lower urinary tract symptoms 08/06/2017   Osteoarthritis of right knee 06/19/2017   S/P orthopedic surgery, follow-up exam 10/25/2016   Tendon rupture of wrist, sequela 01/02/2016   Acute pain of right knee 08/03/2015   Calcific tendinitis of left shoulder 07/05/2015   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/05/2015   Gait difficulty 06/26/2015   Severe mitral regurgitation 06/05/2015   AF (paroxysmal atrial fibrillation) (HCC) 05/31/2015   Chronic pain 05/31/2015   COPD with acute exacerbation (HCC) 05/31/2015   Essential hypertension 05/31/2015   Chronic pain of left wrist 04/24/2015   Arthritis of left wrist 04/24/2015   Rupture of extensor tendon of left hand 04/24/2015  PCP:  [facility] Pharmacy:   [facility] - [place], [place] - [address] [place] [address] Phone: [phone] Fax: [phone]     Social Determinants of Health (SDOH) Interventions    Readmission Risk Interventions Readmission Risk Prevention Plan 01/12/2021 10/20/2020  Transportation Screening Complete Complete  PCP or Specialist Appt within 3-5 Days - Complete  HRI or Home Care Consult - Complete  Palliative Care Screening - Not Applicable  Medication Review (RN Care Manager) Complete Referral to Pharmacy  PCP or Specialist appointment within 3-5 days of discharge Complete -  HRI or Home Care Consult Complete -  SW Recovery Care/Counseling Consult  Complete -  Palliative Care Screening Not Applicable -  Skilled Nursing Facility Complete -

## 2021-03-19 NOTE — Progress Notes (Signed)
[facility]  - [place] at [facility]   PATIENT NAME: Raymond Hays    MR#:  [id number]  DATE OF BIRTH:  1945/11/23  SUBJECTIVE:  patient seen earlier in the ER. No family at bedside. Patient complains of itching and pain in both his eyes since after he was been diagnosed with her for zoster when I entered the ER room had significant pain. Has been on antiviral meds for last week. Came in with overall declining condition. Found to have abnormal UA. Has a chronic Foley catheter.  Patient keeps his eyes closed. Per RN abdominal distention. Patient is a very poor historian.  REVIEW OF SYSTEMS:   Review of Systems  Constitutional:  Negative for chills, fever and weight loss.  HENT:  Negative for ear discharge, ear pain and nosebleeds.   Eyes:  Positive for blurred vision, pain and discharge.  Respiratory:  Negative for sputum production, shortness of breath, wheezing and stridor.   Cardiovascular:  Negative for chest pain, palpitations, orthopnea and PND.  Gastrointestinal:  Negative for abdominal pain, diarrhea, nausea and vomiting.  Genitourinary:  Negative for frequency and urgency.  Musculoskeletal:  Negative for back pain and joint pain.  Neurological:  Positive for weakness. Negative for sensory change, speech change and focal weakness.  Psychiatric/Behavioral:  Negative for depression and hallucinations. The patient is not nervous/anxious.   Tolerating Diet: Tolerating PT: bed bound  DRUG ALLERGIES:  No Known Allergies  VITALS:  Blood pressure 118/76, pulse (!) 120, temperature 98 F (36.7 C), resp. rate 13, height 5\' 8"  (1.727 m), SpO2 94 %.  PHYSICAL EXAMINATION:   Physical Exam  GENERAL:  75 y.o.-year-old patient lying in the bed with no acute distress. Chronically ill HEENT: Head atraumatic, normocephalic. Keeps both eyes closed. Patient has zoster lesions which are scabbed on left forehead. LUNGS: Normal breath sounds bilaterally, no wheezing, rales,  rhonchi. No use of accessory muscles of respiration.  CARDIOVASCULAR: S1, S2 normal. No murmurs, rubs, or gallops. Tachycardia ABDOMEN: Soft, nontender, nondistended. Bowel sounds present. No organomegaly or mass. Chronic suprapubic Foley+ EXTREMITIES: chronic bilateral lower extremity edema with chronic ulcers NEUROLOGIC: nonfocal PSYCHIATRIC:  patient is alert and awake SKIN: per RN  LABORATORY PANEL:  CBC Recent Labs  Lab 03/19/21 0557  WBC 9.4  HGB 12.8*  HCT 38.8*  PLT 220     Chemistries  Recent Labs  Lab 03/18/21 0454 03/19/21 0557  NA 140 141  K 3.9 3.5  CL 111 112*  CO2 21* 22  GLUCOSE 102* 123*  BUN 33* 28*  CREATININE 1.09 1.04  CALCIUM 8.5* 8.6*  MG 2.3  --   AST  --  13*  ALT  --  <5  ALKPHOS  --  72  BILITOT  --  0.9    Cardiac Enzymes No results for input(s): TROPONINI in the last 168 hours. RADIOLOGY:  DG Abd 1 View  Result Date: 03/19/2021 CLINICAL DATA:  Shortness of breath EXAM: ABDOMEN - 1 VIEW COMPARISON:  01/31/2021 FINDINGS: Diffuse colonic gas with distension. Diffuse small bowel gas with less prominent distension. Mild retained stool mainly seen in the pelvis. Similar pattern was seen previously. No concerning mass effect or gas collection. Bilateral hip arthroplasty. IMPRESSION: Diffuse gaseous distension of bowel suggesting adynamic ileus, also seen previously. Electronically Signed   By: 13/05/2020 M.D.   On: 03/19/2021 04:29   ASSESSMENT AND PLAN:   Raymond Hays is a 75 y.o. male with medical history significant for COPD, non-insulin-dependent diabetes mellitus,  hypertension, history of hyperlipidemia, paroxysmal atrial fibrillation, osteoarthritis, history of spinal stenosis, on anticoagulation with eliquis, urinary retention, chronic indwelling Foley catheter, who presents emergency department for chief concerns of generalized weakness and health decline for 3 days.  #Severe constipation with abdominal distention -- KUB shows a  dynamic ileus with stool -- patient has similar presentation about a month ago. Daily animals and stool softeners were recommended to be given not sure if [facility] is continuing the protocol -- poor appetite and nausea -- will give stool softener, lactulose, ENEMA Dulcolax  # Severe sepsis  suspected due to UTI with h/o chronic indwelling foley -pt came in with Hypotension, responsive to IVF, altered mental status, increased lactic acid, elevated WBC, source of urine, organ involvement including renal - started on midodrine 10 mg -  procalcitonin  negative - MRSA PCR - Urine cultures E. coli. - Blood cultures negative - Continue with cefepime and vancomycin per pharmacy--cont cefepime-- change to Rocephin - Lactic acid 3.9--3.9--1.4 --12/19-- normal. Urine no WBC. I will hold off further antibiotic. Patient likely has significant count colonization due to suprapubic catheter  # Acute kidney injury-secondary to the above number, treat as above - Strict I's and O's --baseline creat 0.8 --came in with creat 2.03--1.44   # acute on chronic  atrial fibrillation-resumed home Eliquis -  resume amiodarone --12/19-- patient man into a fib with heart rate in the 150s. Patient's primary cardiologist is Dr. 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 3 times daily --d/w Dr Druscilla Brownie-- ok to use prednisolone 1% ED tid both eyes for possible iritis. --pt needs to follow up with Dr Inez Pilgrim   # Depression/anxiety-escitalopram    # Neuropathy- gabapentin  Family communication :spoke with sister Raymond Hays on the  phone. Updated. Sister understands patient has a poor prognosis Consults : cardiology CODE STATUS: DNR -- discussed with sister Raymond Hays on 03/19/21 DVT Prophylaxis :eliquis Level of care: Telemetry Medical Status is: inpatient  Adynamic ileus     TOTAL TIME TAKING CARE OF THIS PATIENT: 30 minutes.  >50% time spent on counselling and coordination of care  Note: This dictation was prepared with Dragon dictation along with smaller phrase technology. Any transcriptional errors that result from this process are unintentional.  Enedina Finner M.D    [facility]   CC: Primary care physician; [facility] Patient ID: Nyzir Dubois, male   DOB: July 05, 1945, 75 y.o.   MRN: [id number]

## 2021-03-19 NOTE — Consult Note (Signed)
Raymond Hays is a 75 y.o. male  [id number]  Primary Cardiologist: Adrian Blackwater, MD Reason for Consultation: atrial fibrillation with RVR  HPI: Raymond Hays is a 75 y.o. male with medical history significant for COPD, non-insulin-dependent diabetes mellitus, hypertension, history of hyperlipidemia, paroxysmal atrial fibrillation, osteoarthritis, history of spinal stenosis, on anticoagulation with eliquis, urinary retention, chronic indwelling Foley catheter, who presents emergency department for chief concerns of generalized weakness and health decline for 3 days.  Patient currently in atrial fibrilaltion with heart rate of 125 bpm. Amiodarone was stopped in ED due to prolonged QT, has since been restarted by mouth.   Review of Systems: Denies chest pain, shortness of breath.    Past Medical History:  Diagnosis Date   Allergic rhinitis    Anxiety    Arthritis    Chronic indwelling Foley catheter    COPD (chronic obstructive pulmonary disease) (HCC)    Cough    Diabetes (HCC)    Essential (primary) hypertension    Hemorrhoid    Hyperlipidemia    PAF (paroxysmal atrial fibrillation) (HCC)    Primary osteoarthritis, unspecified shoulder    Retention of urine, unspecified    Spinal stenosis    UTI (urinary tract infection)     Medications Prior to Admission  Medication Sig Dispense Refill   acetaminophen (TYLENOL) 325 MG tablet Take 650 mg by mouth every 4 (four) hours as needed for mild pain.     Albuterol Sulfate (PROAIR RESPICLICK) 108 (90 Base) MCG/ACT AEPB Inhale 2 puffs into the lungs every 4 (four) hours as needed (COPD).     amiodarone (PACERONE) 100 MG tablet Take 100 mg by mouth daily. (Hold for HR <60 or >120)     apixaban (ELIQUIS) 5 MG TABS tablet Take 5 mg by mouth 2 (two) times daily.      atorvastatin (LIPITOR) 20 MG tablet Take 20 mg by mouth at bedtime.     bisacodyl (DULCOLAX) 10 MG suppository Place 10 mg rectally daily as needed for moderate constipation.      calcium carbonate (TUMS - DOSED IN MG ELEMENTAL CALCIUM) 500 MG chewable tablet Chew 2 tablets by mouth every 6 (six) hours as needed for indigestion or heartburn.     carbidopa-levodopa (SINEMET IR) 25-100 MG tablet Take 1 tablet by mouth 3 (three) times daily before meals.     Emollient (CETAPHIL) cream Apply 1 application topically 2 (two) times daily as needed (dry skin). (Apply to entire body)     escitalopram (LEXAPRO) 5 MG tablet Take 5 mg by mouth daily.     finasteride (PROSCAR) 5 MG tablet Take 5 mg by mouth daily.     fluticasone (FLONASE) 50 MCG/ACT nasal spray Place 1 spray into both nostrils daily.     gabapentin (NEURONTIN) 800 MG tablet Take 800 mg by mouth 2 (two) times daily.      guaiFENesin (MUCINEX) 600 MG 12 hr tablet Take 1 tablet (600 mg total) by mouth 2 (two) times daily.     hydrOXYzine (ATARAX/VISTARIL) 25 MG tablet Take 25 mg by mouth 2 (two) times daily as needed for itching.     ipratropium (ATROVENT) 0.03 % nasal spray Place 2 sprays into both nostrils 2 (two) times daily.     ipratropium-albuterol (DUONEB) 0.5-2.5 (3) MG/3ML SOLN Inhale 3 mLs into the lungs daily.     lactase (LACTAID) 3000 units tablet Take 3,000 Units by mouth 3 (three) times daily with meals.     Menthol, Topical Analgesic,  4 % GEL Apply 1 application topically 3 (three) times daily. (Apply to neck)     metFORMIN (GLUCOPHAGE-XR) 500 MG 24 hr tablet Take 500 mg by mouth daily with supper.     methocarbamol (ROBAXIN) 500 MG tablet Take 1,000 mg by mouth in the morning and at bedtime.      mineral oil enema Place 133 mLs (1 enema total) rectally daily. 133 mL 0   nitrofurantoin (MACRODANTIN) 100 MG capsule Take 100 mg by mouth every 6 (six) hours.     oxybutynin (DITROPAN-XL) 10 MG 24 hr tablet Take 1 tablet (10 mg total) by mouth daily.     pantoprazole (PROTONIX) 40 MG tablet Take 1 tablet (40 mg total) by mouth 2 (two) times daily before a meal. For 8 weeks. (Patient taking differently: Take 40  mg by mouth 2 (two) times daily before a meal.)  2   polyethylene glycol (MIRALAX / GLYCOLAX) 17 g packet Take 17 g by mouth daily.     Propylene Glycol (SYSTANE BALANCE) 0.6 % SOLN Place 2 drops into both eyes in the morning, at noon, and at bedtime.     psyllium (METAMUCIL) 58.6 % powder Take 1 packet by mouth daily.     selenium sulfide (SELSUN) 1 % LOTN Apply 1 application topically 2 (two) times a week. (Tuesday and Friday)     TRELEGY ELLIPTA 100-62.5-25 MCG/INH AEPB Inhale 1 puff into the lungs daily.     triamcinolone cream (KENALOG) 0.1 % Apply 1 application topically 2 (two) times daily.     valACYclovir (VALTREX) 1000 MG tablet Take 1 tablet (1,000 mg total) by mouth 3 (three) times daily for 10 days. 30 tablet 0   feeding supplement, GLUCERNA SHAKE, (GLUCERNA SHAKE) LIQD Take 237 mLs by mouth 2 (two) times daily between meals.  0   metoprolol tartrate (LOPRESSOR) 100 MG tablet Take 1 tablet (100 mg total) by mouth 2 (two) times daily. (Patient not taking: Reported on 03/17/2021)        amiodarone  100 mg Oral Daily   apixaban  5 mg Oral BID   atorvastatin  20 mg Oral QHS   bisacodyl  10 mg Rectal Daily   carbidopa-levodopa  1 tablet Oral TID AC   escitalopram  5 mg Oral Daily   feeding supplement (GLUCERNA SHAKE)  237 mL Oral BID BM   finasteride  5 mg Oral Daily   fluticasone  1 spray Each Nare Daily   fluticasone furoate-vilanterol  1 puff Inhalation Daily   And   umeclidinium bromide  1 puff Inhalation Daily   gabapentin  800 mg Oral BID   insulin aspart  0-5 Units Subcutaneous QHS   insulin aspart  0-9 Units Subcutaneous TID WC   ipratropium-albuterol  3 mL Inhalation Daily   lactulose  20 g Oral TID   midodrine  10 mg Oral TID   oxybutynin  10 mg Oral Daily   prednisoLONE acetate  1 drop Both Eyes QID   sodium phosphate  1 enema Rectal Once   valACYclovir  1,000 mg Oral TID    Infusions:  sodium chloride 10 mL/hr at 03/18/21 0310   cefTRIAXone (ROCEPHIN)  IV       No Known Allergies  Social History   Socioeconomic History   Marital status: Divorced    Spouse name: Not on file   Number of children: Not on file   Years of education: Not on file   Highest education level: Not on file  Occupational History   Occupation: retired    Comment: retired Hotel manager x 4 years; Scientist, physiological; Automotive engineer  Tobacco Use   Smoking status: Former    Packs/day: 0.50    Years: 10.00    Pack years: 5.00    Types: Cigarettes    Quit date: 2006    Years since quitting: 16.9   Smokeless tobacco: Never  Vaping Use   Vaping Use: Never used  Substance and Sexual Activity   Alcohol use: Not Currently   Drug use: Not Currently    Types: "Crack" cocaine    Comment: quit "crack" 15 years ago--11/04/2019   Sexual activity: Not on file  Other Topics Concern   Not on file  Social History Narrative   Not on file   Social Determinants of Health   Financial Resource Strain: Not on file  Food Insecurity: Not on file  Transportation Needs: Not on file  Physical Activity: Not on file  Stress: Not on file  Social Connections: Not on file  Intimate Partner Violence: Not on file    Family History  Problem Relation Age of Onset   Healthy Son    Healthy Son     PHYSICAL EXAM: Vitals:   03/19/21 0833 03/19/21 1127  BP:  101/83  Pulse:  (!) 160  Resp:  18  Temp:  97.9 F (36.6 C)  SpO2: 95% 97%     Intake/Output Summary (Last 24 hours) at 03/19/2021 1316 Last data filed at 03/19/2021 1200 Gross per 24 hour  Intake 440 ml  Output 300 ml  Net 140 ml    General:  Well appearing. No respiratory difficulty HEENT: normal Neck: supple. no JVD. Carotids 2+ bilat; no bruits. No lymphadenopathy or thryomegaly appreciated. Cor: PMI nondisplaced. Regular rate & rhythm. No rubs, gallops or murmurs. Lungs: clear Abdomen: soft, nontender, nondistended. No hepatosplenomegaly. No bruits or masses. Good bowel sounds. Extremities: no cyanosis, clubbing,  rash, edema Neuro: alert & oriented x 3, cranial nerves grossly intact. moves all 4 extremities w/o difficulty. Affect pleasant.  ECG: atrial fibrillation, LBBB, HR 82 bpm  Results for orders placed or performed during the hospital encounter of 03/16/21 (from the past 24 hour(s))  Glucose, capillary     Status: Abnormal   Collection Time: 03/18/21  4:37 PM  Result Value Ref Range   Glucose-Capillary 128 (H) 70 - 99 mg/dL  Glucose, capillary     Status: Abnormal   Collection Time: 03/18/21  8:52 PM  Result Value Ref Range   Glucose-Capillary 103 (H) 70 - 99 mg/dL  Basic metabolic panel     Status: Abnormal   Collection Time: 03/19/21  5:57 AM  Result Value Ref Range   Sodium 141 135 - 145 mmol/L   Potassium 3.5 3.5 - 5.1 mmol/L   Chloride 112 (H) 98 - 111 mmol/L   CO2 22 22 - 32 mmol/L   Glucose, Bld 123 (H) 70 - 99 mg/dL   BUN 28 (H) 8 - 23 mg/dL   Creatinine, Ser 5.10 0.61 - 1.24 mg/dL   Calcium 8.6 (L) 8.9 - 10.3 mg/dL   GFR, Estimated >25 >85 mL/min   Anion gap 7 5 - 15  Hepatic function panel     Status: Abnormal   Collection Time: 03/19/21  5:57 AM  Result Value Ref Range   Total Protein 5.5 (L) 6.5 - 8.1 g/dL   Albumin 2.7 (L) 3.5 - 5.0 g/dL   AST 13 (L) 15 - 41 U/L  ALT <5 0 - 44 U/L   Alkaline Phosphatase 72 38 - 126 U/L   Total Bilirubin 0.9 0.3 - 1.2 mg/dL   Bilirubin, Direct 0.2 0.0 - 0.2 mg/dL   Indirect Bilirubin 0.7 0.3 - 0.9 mg/dL  CBC with Differential/Platelet     Status: Abnormal   Collection Time: 03/19/21  5:57 AM  Result Value Ref Range   WBC 9.4 4.0 - 10.5 K/uL   RBC 4.11 (L) 4.22 - 5.81 MIL/uL   Hemoglobin 12.8 (L) 13.0 - 17.0 g/dL   HCT 25.4 (L) 98.2 - 64.1 %   MCV 94.4 80.0 - 100.0 fL   MCH 31.1 26.0 - 34.0 pg   MCHC 33.0 30.0 - 36.0 g/dL   RDW 58.3 09.4 - 07.6 %   Platelets 220 150 - 400 K/uL   nRBC 0.2 0.0 - 0.2 %   Neutrophils Relative % 71 %   Neutro Abs 6.6 1.7 - 7.7 K/uL   Lymphocytes Relative 17 %   Lymphs Abs 1.6 0.7 - 4.0 K/uL    Monocytes Relative 8 %   Monocytes Absolute 0.7 0.1 - 1.0 K/uL   Eosinophils Relative 2 %   Eosinophils Absolute 0.2 0.0 - 0.5 K/uL   Basophils Relative 1 %   Basophils Absolute 0.1 0.0 - 0.1 K/uL   Immature Granulocytes 1 %   Abs Immature Granulocytes 0.11 (H) 0.00 - 0.07 K/uL  Glucose, capillary     Status: Abnormal   Collection Time: 03/19/21  7:40 AM  Result Value Ref Range   Glucose-Capillary 112 (H) 70 - 99 mg/dL  Glucose, capillary     Status: Abnormal   Collection Time: 03/19/21 11:28 AM  Result Value Ref Range   Glucose-Capillary 159 (H) 70 - 99 mg/dL   DG Abd 1 View  Result Date: 03/19/2021 CLINICAL DATA:  Shortness of breath EXAM: ABDOMEN - 1 VIEW COMPARISON:  01/31/2021 FINDINGS: Diffuse colonic gas with distension. Diffuse small bowel gas with less prominent distension. Mild retained stool mainly seen in the pelvis. Similar pattern was seen previously. No concerning mass effect or gas collection. Bilateral hip arthroplasty. IMPRESSION: Diffuse gaseous distension of bowel suggesting adynamic ileus, also seen previously. Electronically Signed   By: Tiburcio Pea M.D.   On: 03/19/2021 04:29     ASSESSMENT AND PLAN: Patient denies chest pain, shortness of breath. Patient currently in atrial fibrillation with RVR. Will change po amiodarone to IV per protocol.   Museum/gallery conservator FNP-C

## 2021-03-20 DIAGNOSIS — K5900 Constipation, unspecified: Secondary | ICD-10-CM | POA: Diagnosis not present

## 2021-03-20 DIAGNOSIS — K560 Paralytic ileus: Secondary | ICD-10-CM | POA: Diagnosis not present

## 2021-03-20 DIAGNOSIS — B0231 Zoster conjunctivitis: Secondary | ICD-10-CM | POA: Diagnosis not present

## 2021-03-20 DIAGNOSIS — I480 Paroxysmal atrial fibrillation: Secondary | ICD-10-CM | POA: Diagnosis not present

## 2021-03-20 LAB — GLUCOSE, CAPILLARY
Glucose-Capillary: 118 mg/dL — ABNORMAL HIGH (ref 70–99)
Glucose-Capillary: 167 mg/dL — ABNORMAL HIGH (ref 70–99)
Glucose-Capillary: 125 mg/dL — ABNORMAL HIGH (ref 70–99)
Glucose-Capillary: 135 mg/dL — ABNORMAL HIGH (ref 70–99)

## 2021-03-20 MED ORDER — METOPROLOL TARTRATE 25 MG PO TABS
25.0000 mg | ORAL_TABLET | Freq: Three times a day (TID) | ORAL | Status: DC
  Administered 2021-03-20 – 2021-03-26 (×14): 25 mg via ORAL
  Filled 2021-03-20 (×17): qty 1

## 2021-03-20 MED ORDER — DILTIAZEM HCL 25 MG/5ML IV SOLN
10.0000 mg | Freq: Once | INTRAVENOUS | Status: AC
  Administered 2021-03-20: 05:00:00 10 mg via INTRAVENOUS
  Filled 2021-03-20: qty 5

## 2021-03-20 MED ORDER — DILTIAZEM HCL 30 MG PO TABS: 30.0000 mg | ORAL_TABLET | Freq: Four times a day (QID) | ORAL | Status: DC

## 2021-03-20 MED ORDER — DILTIAZEM HCL 25 MG/5ML IV SOLN
20.0000 mg | Freq: Once | INTRAVENOUS | Status: AC
  Administered 2021-03-20: 13:00:00 20 mg via INTRAVENOUS
  Filled 2021-03-20: qty 5

## 2021-03-20 NOTE — Progress Notes (Addendum)
Pt's HR sustaining between 120s-140s afib, BP132/98, axillary temp 96.7, RR 16, O2 95% RA. Pt is asleep in the bed. MD Toniann Fail notified, no new orders at this time.

## 2021-03-20 NOTE — Consult Note (Signed)
MEDICATION RELATED CONSULT NOTE - INITIAL   Pharmacy Consult for Amiodarone DDI monitoring Indication: Afib  No Known Allergies  Patient Measurements: Height: 5\' 8"  (172.7 cm) IBW/kg (Calculated) : 68.4  Vital Signs: Temp: 98.7 F (37.1 C) (12/20 0741) Temp Source: Axillary (12/20 0018) BP: 122/80 (12/20 0741) Pulse Rate: 110 (12/20 0741)   Labs: Recent Labs    03/18/21 0454 03/19/21 0557  WBC  --  9.4  HGB  --  12.8*  HCT  --  38.8*  PLT  --  220  CREATININE 1.09 1.04  MG 2.3  --   ALBUMIN  --  2.7*  PROT  --  5.5*  AST  --  13*  ALT  --  <5  ALKPHOS  --  72  BILITOT  --  0.9  BILIDIR  --  0.2  IBILI  --  0.7    Estimated Creatinine Clearance: 57.5 mL/min (by C-G formula based on SCr of 1.04 mg/dL).  Medical History: Past Medical History:  Diagnosis Date   Allergic rhinitis    Anxiety    Arthritis    Chronic indwelling Foley catheter    COPD (chronic obstructive pulmonary disease) (HCC)    Cough    Diabetes (HCC)    Essential (primary) hypertension    Hemorrhoid    Hyperlipidemia    PAF (paroxysmal atrial fibrillation) (HCC)    Primary osteoarthritis, unspecified shoulder    Retention of urine, unspecified    Spinal stenosis    UTI (urinary tract infection)     Medications:    Assessment: 75yo Male w/ h/o COPD, niDDM, HTN, HLD, parAFib (on eliquis), OA, urinary retention (chronic indwelling cath), presenting with CC of generalized weakness and health decline x3 days. Pt admitted with septic shock 2/2 UTI (Ecoli).  Amiodarone initially held on admission/ED visit in response to prolonged Qtc, but Afib with HR of 125bpm restarted per cardiology. Pharmacy consulted for DDI assessment.  Goal of Therapy:  Monitor for new/existing DDI's   Plan:  PTA Amiodarone PO 100mg  QD held; currently starting with IV loading dose.  Lopressor (BB AV-block), amiodarone (BB AV-block) , midodrine (rebound brady) can cause additive bradycardia. CTM HR and BP,  consider de-escalation of midodrine if rate captured. Zofran 4mg  PO/IV (1 dose/24hrs) -- Additive Qtc prolongation. No adjustment necessary & utilization acceptably low (dose/freq dependent). Can consider Qtc sparing alternatives for N/V indication.  Lexapro 5mg  -- additive QTC prolongation. No adjustment necessary, dose appropriate. Eliquis effects can be inc'd w/ concomitant Amio. No adjustment req'd, pt stable on both meds at intake. CTM CBC.  75yo, PharmD, [facility] Clinical Pharmacist 03/20/2021,8:41 AM

## 2021-03-20 NOTE — Progress Notes (Signed)
HR now sustaining 110s-120s afib, pt asleep in the bed. MD Toniann Fail made aware, no new orders at this time.

## 2021-03-20 NOTE — Progress Notes (Signed)
SUBJECTIVE: Raymond Hays is a 75 y.o. male with medical history significant for COPD, non-insulin-dependent diabetes mellitus, hypertension, history of hyperlipidemia, paroxysmal atrial fibrillation, osteoarthritis, history of spinal stenosis, on anticoagulation with eliquis, urinary retention, chronic indwelling Foley catheter, who presents emergency department for chief concerns of generalized weakness and health decline for 3 days.   Patient currently in atrial fibrilaltion with heart rate of 130 bpm. Patient currently on IV amiodarone.   Vitals:   03/19/21 2008 03/20/21 0018 03/20/21 0318 03/20/21 0741  BP: 110/71 106/76 108/84 122/80  Pulse: 95 100 (!) 107 (!) 110  Resp: 16 16 16 20   Temp: (!) 96.7 F (35.9 C) 97.7 F (36.5 C) (!) 97.3 F (36.3 C) 98.7 F (37.1 C)  TempSrc:  Axillary    SpO2: 96% 95% 94% 96%  Height:        Intake/Output Summary (Last 24 hours) at 03/20/2021 0823 Last data filed at 03/20/2021 0400 Gross per 24 hour  Intake 871.05 ml  Output 300 ml  Net 571.05 ml    LABS: Basic Metabolic Panel: Recent Labs    03/18/21 0454 03/19/21 0557  NA 140 141  K 3.9 3.5  CL 111 112*  CO2 21* 22  GLUCOSE 102* 123*  BUN 33* 28*  CREATININE 1.09 1.04  CALCIUM 8.5* 8.6*  MG 2.3  --    Liver Function Tests: Recent Labs    03/19/21 0557  AST 13*  ALT <5  ALKPHOS 72  BILITOT 0.9  PROT 5.5*  ALBUMIN 2.7*   No results for input(s): LIPASE, AMYLASE in the last 72 hours. CBC: Recent Labs    03/19/21 0557  WBC 9.4  NEUTROABS 6.6  HGB 12.8*  HCT 38.8*  MCV 94.4  PLT 220   Cardiac Enzymes: No results for input(s): CKTOTAL, CKMB, CKMBINDEX, TROPONINI in the last 72 hours. BNP: Invalid input(s): POCBNP D-Dimer: No results for input(s): DDIMER in the last 72 hours. Hemoglobin A1C: No results for input(s): HGBA1C in the last 72 hours. Fasting Lipid Panel: No results for input(s): CHOL, HDL, LDLCALC, TRIG, CHOLHDL, LDLDIRECT in the last 72  hours. Thyroid Function Tests: No results for input(s): TSH, T4TOTAL, T3FREE, THYROIDAB in the last 72 hours.  Invalid input(s): FREET3 Anemia Panel: No results for input(s): VITAMINB12, FOLATE, FERRITIN, TIBC, IRON, RETICCTPCT in the last 72 hours.   PHYSICAL EXAM General: Well developed, well nourished, in no acute distress HEENT:  Normocephalic and atramatic Neck:  No JVD.  Lungs: Clear bilaterally to auscultation and percussion. Heart: HRRR . Normal S1 and S2 without gallops or murmurs.  Abdomen: Bowel sounds are positive, abdomen soft and non-tender  Msk:  Back normal, normal gait. Normal strength and tone for age. Extremities: No clubbing, cyanosis or edema.   Neuro: Alert and oriented X 3. Psych:  Good affect, responds appropriately  TELEMETRY: atrial fibrillation, HR 130 bpm  ASSESSMENT AND PLAN: Patient denies chest pain, shortness of breath. Continues to be in atrial fibrillation with HR 130's. Add IV Cardizem drip.   Principal Problem:   Severe sepsis with septic shock (HCC) Active Problems:   AF (paroxysmal atrial fibrillation) (HCC)   Benign prostatic hyperplasia with lower urinary tract symptoms   Essential hypertension   Sepsis (HCC)   Iron deficiency anemia   E. coli UTI   Herpes zoster conjunctivitis of left eye    Marilene Vath, FNP-C 03/20/2021 8:23 AM

## 2021-03-20 NOTE — Progress Notes (Signed)
[facility]  - [place] [facility]   PATIENT NAME: Raymond Hays    MR#:  [id number]  DATE OF BIRTH:  Oct 09, 1945  SUBJECTIVE:  patient seen earlier in the ER. No family at bedside. Patient complains of itching and pain in both his eyes since after he was been diagnosed with her for zoster when I entered the ER room had significant pain. Has been on antiviral meds for last week. Came in with overall declining condition. Found to have abnormal UA. Has a chronic Foley catheter.   Patient is a very poor historian. Per RN had all moderate to large BM yesterday. Remains in one 30s heart rate despite amiodarone drip. REVIEW OF SYSTEMS:   Review of Systems  Constitutional:  Negative for chills, fever and weight loss.  HENT:  Negative for ear discharge, ear pain and nosebleeds.   Eyes:  Positive for blurred vision, pain and discharge.  Respiratory:  Negative for sputum production, shortness of breath, wheezing and stridor.   Cardiovascular:  Negative for chest pain, palpitations, orthopnea and PND.  Gastrointestinal:  Negative for abdominal pain, diarrhea, nausea and vomiting.  Genitourinary:  Negative for frequency and urgency.  Musculoskeletal:  Negative for back pain and joint pain.  Neurological:  Positive for weakness. Negative for sensory change, speech change and focal weakness.  Psychiatric/Behavioral:  Negative for depression and hallucinations. The patient is not nervous/anxious.   Tolerating Diet: Tolerating PT: bed bound  DRUG ALLERGIES:  No Known Allergies  VITALS:  Blood pressure 114/83, pulse (!) 110, temperature 98.7 F (37.1 C), resp. rate 19, height 5\' 8"  (1.727 m), SpO2 96 %.  PHYSICAL EXAMINATION:   Physical Exam  GENERAL:  75 y.o.-year-old patient lying in the bed with no acute distress. Chronically ill HEENT: Head atraumatic, normocephalic. Keeps both eyes closed. Patient has zoster lesions which are scabbed on left forehead. LUNGS: Normal breath  sounds bilaterally, no wheezing, rales, rhonchi. No use of accessory muscles of respiration.  CARDIOVASCULAR: S1, S2 normal. No murmurs, rubs, or gallops. Tachycardia ABDOMEN: Soft, nontender, nondistended. Bowel sounds present. No organomegaly or mass. Chronic suprapubic Foley+ EXTREMITIES: chronic bilateral lower extremity edema with chronic ulcers NEUROLOGIC: nonfocal PSYCHIATRIC:  patient is alert and awake SKIN: per RN  LABORATORY PANEL:  CBC Recent Labs  Lab 03/19/21 0557  WBC 9.4  HGB 12.8*  HCT 38.8*  PLT 220     Chemistries  Recent Labs  Lab 03/18/21 0454 03/19/21 0557  NA 140 141  K 3.9 3.5  CL 111 112*  CO2 21* 22  GLUCOSE 102* 123*  BUN 33* 28*  CREATININE 1.09 1.04  CALCIUM 8.5* 8.6*  MG 2.3  --   AST  --  13*  ALT  --  <5  ALKPHOS  --  72  BILITOT  --  0.9    Cardiac Enzymes No results for input(s): TROPONINI in the last 168 hours. RADIOLOGY:  DG Abd 1 View  Result Date: 03/19/2021 CLINICAL DATA:  Shortness of breath EXAM: ABDOMEN - 1 VIEW COMPARISON:  01/31/2021 FINDINGS: Diffuse colonic gas with distension. Diffuse small bowel gas with less prominent distension. Mild retained stool mainly seen in the pelvis. Similar pattern was seen previously. No concerning mass effect or gas collection. Bilateral hip arthroplasty. IMPRESSION: Diffuse gaseous distension of bowel suggesting adynamic ileus, also seen previously. Electronically Signed   By: 13/05/2020 M.D.   On: 03/19/2021 04:29   ASSESSMENT AND PLAN:   Raymond Hays is a 75 y.o. male  with medical history significant for COPD, non-insulin-dependent diabetes mellitus, hypertension, history of hyperlipidemia, paroxysmal atrial fibrillation, osteoarthritis, history of spinal stenosis, on anticoagulation with eliquis, urinary retention, chronic indwelling Foley catheter, who presents emergency department for chief concerns of generalized weakness and health decline for 3 days.  #Severe constipation with  abdominal distention -- KUB shows a dynamic ileus with stool -- patient has similar presentation about a month ago. Daily animals and stool softeners were recommended to be given not sure if [facility] is continuing the protocol -- poor appetite and nausea -- will give stool softener, lactulose, ENEMA Dulcolax --12/20-- patient had moderate to large soft BM per RN. Continue bowel prep.  # Severe sepsis  suspected due to UTI with h/o chronic indwelling foley -pt came in with Hypotension, responsive to IVF, altered mental status, increased lactic acid, elevated WBC, source of urine, organ involvement including renal - started on midodrine 10 mg -  procalcitonin  negative - MRSA PCR - Urine cultures E. coli. - Blood cultures negative - Continue with cefepime and vancomycin per pharmacy--cont cefepime-- change to Rocephin - Lactic acid 3.9--3.9--1.4 --12/19-- normal. Urine no WBC. I will hold off further antibiotic. 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 Patient's primary cardiologist is Dr. Welton Flakes --IV AMIODARINE GTT started by cardiology --12/20-- added IV Cardizem drip due to heart rate remaining elevated. Patient is on metoprolol 25 TID. 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 3 times daily --d/w Dr Raymond Hays-- ok to use prednisolone 1% ED tid both eyes for  possible iritis. --pt needs to follow up with Dr Raymond Hays   # Depression/anxiety-escitalopram    # Neuropathy- gabapentin  Family communication :spoke with sister Raymond Hays on the phone. Updated. Sister understands patient has a poor prognosis Consults : cardiology CODE STATUS: DNR -- discussed with sister Raymond Hays on 03/19/21 DVT Prophylaxis :eliquis Level of care: Telemetry Medical Status is: inpatient  Adynamic ileus, atrial fibrillation RVR     TOTAL TIME TAKING CARE OF THIS PATIENT: 30 minutes.  >50% time spent on counselling and coordination of care  Note: This dictation was prepared with Dragon dictation along with smaller phrase technology. Any transcriptional errors that result from this process are unintentional.  Enedina Finner M.D    [facility]   CC: Primary care physician; [facility] Patient ID: Orey Moure, male   DOB: 1945/06/22, 75 y.o.   MRN: [id number]

## 2021-03-20 NOTE — Progress Notes (Signed)
Gave cardizem IV bolus to patient as ordered by MD Toniann Fail.

## 2021-03-21 ENCOUNTER — Inpatient Hospital Stay: Payer: No Typology Code available for payment source

## 2021-03-21 DIAGNOSIS — A419 Sepsis, unspecified organism: Secondary | ICD-10-CM | POA: Diagnosis not present

## 2021-03-21 DIAGNOSIS — R6521 Severe sepsis with septic shock: Secondary | ICD-10-CM | POA: Diagnosis not present

## 2021-03-21 LAB — CULTURE, BLOOD (ROUTINE X 2)
Culture: NO GROWTH
Culture: NO GROWTH
Special Requests: ADEQUATE
Special Requests: ADEQUATE

## 2021-03-21 LAB — GLUCOSE, CAPILLARY
Glucose-Capillary: 116 mg/dL — ABNORMAL HIGH (ref 70–99)
Glucose-Capillary: 114 mg/dL — ABNORMAL HIGH (ref 70–99)
Glucose-Capillary: 114 mg/dL — ABNORMAL HIGH (ref 70–99)
Glucose-Capillary: 116 mg/dL — ABNORMAL HIGH (ref 70–99)

## 2021-03-21 MED ORDER — BISACODYL 10 MG RE SUPP: 10.0000 mg | Freq: Every day | RECTAL | Status: DC | PRN

## 2021-03-21 MED ORDER — ENSURE MAX PROTEIN PO LIQD
11.0000 [oz_av] | Freq: Every day | ORAL | Status: DC
  Administered 2021-03-21 – 2021-03-25 (×5): 11 [oz_av] via ORAL
  Filled 2021-03-21: qty 330

## 2021-03-21 MED ORDER — SENNOSIDES-DOCUSATE SODIUM 8.6-50 MG PO TABS
1.0000 | ORAL_TABLET | Freq: Two times a day (BID) | ORAL | Status: DC
  Administered 2021-03-21 – 2021-03-26 (×11): 1 via ORAL
  Filled 2021-03-21 (×12): qty 1

## 2021-03-21 MED ORDER — LACTULOSE 10 GM/15ML PO SOLN: 20.0000 g | Freq: Two times a day (BID) | ORAL | Status: DC | PRN

## 2021-03-21 MED ORDER — ADULT MULTIVITAMIN W/MINERALS CH
1.0000 | ORAL_TABLET | Freq: Every day | ORAL | Status: DC
  Administered 2021-03-21 – 2021-03-26 (×6): 1 via ORAL
  Filled 2021-03-21 (×6): qty 1

## 2021-03-21 MED ORDER — POLYETHYLENE GLYCOL 3350 17 G PO PACK
17.0000 g | PACK | Freq: Every day | ORAL | Status: DC
  Administered 2021-03-22 – 2021-03-26 (×5): 17 g via ORAL
  Filled 2021-03-21 (×5): qty 1

## 2021-03-21 NOTE — Progress Notes (Signed)
Initial Nutrition Assessment  DOCUMENTATION CODES:   Not applicable  INTERVENTION:   -Glucerna Shake po BID, each supplement provides 220 kcal and 10 grams of protein  -Ensure Max po daily, each supplement provides 150 kcal and 30 grams of protein -MVI with minerals daily    NUTRITION DIAGNOSIS:   Increased nutrient needs related to chronic illness (COPD) as evidenced by estimated needs.  GOAL:   Patient will meet greater than or equal to 90% of their needs  MONITOR:   PO intake, Supplement acceptance, Labs, Weight trends, Skin, I & O's  REASON FOR ASSESSMENT:   Low Braden    ASSESSMENT:   Raymond Hays is a 75 y.o. male with medical history significant for COPD, non-insulin-dependent diabetes mellitus, hypertension, history of hyperlipidemia, paroxysmal atrial fibrillation, osteoarthritis, history of spinal stenosis, on anticoagulation with eliquis, urinary retention, chronic indwelling Foley catheter, who presents emergency department for chief concerns of generalized weakness and health decline for 3 days.  Pt admitted with severe sepsis with septic shock.   Reviewed I/O's: +1.2 L x 24 hours and +5.7 L since admission  UOP: 625 ml x 24 hours  Case discussed with RD at [facility], where pt is a long term resident. Pt was on a regular consistency diet PTA and usually ate well, however, intake declined 2-3 days PTA.   Spoke with pt at bedside, who reports improving appetite since hospitalization. He requested that all of things be in reach- RD adjusted tray, items, and tray table for pt and assisted pt with meal set up. He reports no difficulty with hospital food and has been eating most of his meals. Noted meal completions 50-100%.    Reviewed wt hx; pt has experienced a 30% wt loss over the past 3 months. Suspect current wt is not accurate, as pt does not appear to be at this weight. Additionally, pt reports weight loss due to "being cleaned out".   Discussed  importance of good meal and supplement intake to promote healing.   Medications reviewed and include sinemet, miralax, and senokot.   Labs reviewed: CBGS: 94-167 (inpatient orders for glycemic control are 0-9 units inuslin aspart TID with meals and 0-85 units insulin aspart daily at bedtime).    NUTRITION - FOCUSED PHYSICAL EXAM:  Flowsheet Row Most Recent Value  Orbital Region No depletion  Upper Arm Region No depletion  Thoracic and Lumbar Region No depletion  Buccal Region No depletion  Temple Region No depletion  Clavicle Bone Region No depletion  Clavicle and Acromion Bone Region No depletion  Scapular Bone Region No depletion  Dorsal Hand No depletion  Patellar Region No depletion  Anterior Thigh Region No depletion  Posterior Calf Region No depletion  Edema (RD Assessment) Mild  Hair Reviewed  Eyes Reviewed  Mouth Reviewed  Skin Reviewed  Nails Reviewed       Diet Order:   Diet Order             Diet heart healthy/carb modified Room service appropriate? Yes; Fluid consistency: Thin  Diet effective now                   EDUCATION NEEDS:   Education needs have been addressed  Skin:  Skin Assessment: Reviewed RN Assessment  Last BM:  03/21/21  Height:   Ht Readings from Last 1 Encounters:  03/16/21 5\' 8"  (1.727 m)    Weight:   Wt Readings from Last 1 Encounters:  03/11/21 72.6 kg  Ideal Body Weight:  70 kg  BMI:  Body mass index is 24.33 kg/m.  Estimated Nutritional Needs:   Kcal:  2000-2200  Protein:  105-120 grams  Fluid:  > 2 L    Levada Schilling, RD, LDN, CDCES Registered Dietitian II Certified Diabetes Care and Education Specialist Please refer to [facility] for RD and/or RD on-call/weekend/after hours pager

## 2021-03-21 NOTE — Progress Notes (Signed)
SUBJECTIVE: Raymond Hays is a 75 y.o. male with medical history significant for COPD, non-insulin-dependent diabetes mellitus, hypertension, history of hyperlipidemia, paroxysmal atrial fibrillation, osteoarthritis, history of spinal stenosis, on anticoagulation with eliquis, urinary retention, chronic indwelling Foley catheter, who presents emergency department for chief concerns of generalized weakness and health decline for 3 days.   Patient currently in atrial fibrillation with heart rate of 84 bpm. Patient currently on IV amiodarone and IV Cardizem.  Patient denies chest pain, shortness of breath. Patient confused this morning. Continues to request television remote, despite remote being in his hand. States he wants the remote to his television at home.    Vitals:   03/20/21 2000 03/21/21 0023 03/21/21 0400 03/21/21 0745  BP: 115/84 (!) 113/97 102/71 120/78  Pulse:  93 95 85  Resp: 20 18 18    Temp:  (!) 97.5 F (36.4 C) 98.3 F (36.8 C) 98.3 F (36.8 C)  TempSrc:  Oral Oral Oral  SpO2:  98% 95% 98%  Height:        Intake/Output Summary (Last 24 hours) at 03/21/2021 0833 Last data filed at 03/21/2021 0530 Gross per 24 hour  Intake 1553.29 ml  Output 625 ml  Net 928.29 ml    LABS: Basic Metabolic Panel: Recent Labs    03/19/21 0557  NA 141  K 3.5  CL 112*  CO2 22  GLUCOSE 123*  BUN 28*  CREATININE 1.04  CALCIUM 8.6*   Liver Function Tests: Recent Labs    03/19/21 0557  AST 13*  ALT <5  ALKPHOS 72  BILITOT 0.9  PROT 5.5*  ALBUMIN 2.7*   No results for input(s): LIPASE, AMYLASE in the last 72 hours. CBC: Recent Labs    03/19/21 0557  WBC 9.4  NEUTROABS 6.6  HGB 12.8*  HCT 38.8*  MCV 94.4  PLT 220   Cardiac Enzymes: No results for input(s): CKTOTAL, CKMB, CKMBINDEX, TROPONINI in the last 72 hours. BNP: Invalid input(s): POCBNP D-Dimer: No results for input(s): DDIMER in the last 72 hours. Hemoglobin A1C: No results for input(s): HGBA1C in the  last 72 hours. Fasting Lipid Panel: No results for input(s): CHOL, HDL, LDLCALC, TRIG, CHOLHDL, LDLDIRECT in the last 72 hours. Thyroid Function Tests: No results for input(s): TSH, T4TOTAL, T3FREE, THYROIDAB in the last 72 hours.  Invalid input(s): FREET3 Anemia Panel: No results for input(s): VITAMINB12, FOLATE, FERRITIN, TIBC, IRON, RETICCTPCT in the last 72 hours.   PHYSICAL EXAM General: Well developed, well nourished, in no acute distress HEENT:  Normocephalic and atramatic Neck:  No JVD.  Lungs: Clear bilaterally to auscultation and percussion. Heart: HRRR . Normal S1 and S2 without gallops or murmurs.  Abdomen: Bowel sounds are positive, abdomen soft and non-tender  Msk:  Back normal, normal gait. Normal strength and tone for age. Extremities: No clubbing, cyanosis or edema.   Neuro: Alert and oriented X 3. Psych:  Good affect, responds appropriately  TELEMETRY: atrial fibrillation, HR 84 bpm  ASSESSMENT AND PLAN: Patient denies chest pain, shortness of breath. Continues to be in atrial fibrillation with HR 80's-90's. IV Cardizem drip added yesterday, HR improved. Will continue current medications.   Principal Problem:   Severe sepsis with septic shock (HCC) Active Problems:   AF (paroxysmal atrial fibrillation) (HCC)   Benign prostatic hyperplasia with lower urinary tract symptoms   Essential hypertension   Sepsis (HCC)   Iron deficiency anemia   E. coli UTI   Herpes zoster conjunctivitis of left eye    Danie Hannig,  FNP-C 03/21/2021 8:33 AM

## 2021-03-21 NOTE — Progress Notes (Addendum)
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  Patient reports pain in the right side of his temples and difficult for him to open his eyes due to shingles.  Patient reports compliance with his valacyclovir for his shingle treatment.  Patient denies chest pain, no shortness of breath, no abdominal pain, no hematuria, no diarrhea, no headaches.  In the ED, sodium 138, potassium 3.2, chloride 100, CO2 28, BUN 51, creatinine 2.03, glucose 119, WBC 11.3, hemoglobin 15.2, platelets 287, high sensitive troponin 11.  Lactic acid 3.8, INR 2.2.  In the ED, EDP started cefepime and vancomycin, 2.5 L LR bolus.  Hospitalist service was consulted for further evaluation and management of severe sepsis likely secondary to urinary tract infection.   Assessment & Plan:   Principal Problem:   Severe sepsis with septic shock (HCC) Active Problems:   AF (paroxysmal atrial fibrillation) (HCC)   Benign prostatic hyperplasia with lower urinary tract symptoms   Essential hypertension   Sepsis (HCC)   Iron deficiency anemia   E. coli UTI   Herpes zoster conjunctivitis of left eye   Severe sepsis, POA ESBL E. coli UTI Patient presenting to ED with generalized weakness and was found to have an elevated WBC count of 11.3 in the setting of chronic suprapubic Foley catheter, with lactic acid 3.8, INR 2.2.  Patient was found to be hypotensive, but responsive to IV fluid bolus.  Patient was reportedly confused with  associated acute renal failure at time of admission.  Was started on empiric antibiotics with vancomycin and cefepime.  Urinalysis with large leukocytes, positive nitrite, many bacteria, greater than 50 WBCs.  Urine culture positive for ESBL E. coli.  Blood cultures negative x 5 days.  Blood pressure is now stabilized and midodrine was titrated off.  Completed course of antibiotics with cefepime/ceftriaxone. --Midodrine titrated down to 5 mg p.o. 3 times daily --RN for suprapubic catheter exchange  Severe constipation with abdominal distention KUB notable for dynamic ileus with stool. --Senokot-S1 tablet twice daily --MiraLAX once daily --Bisacodyl 10 mg PR daily --Lactulose 20 g p.o. BID PRN --Strict I's and O's  Paroxysmal atrial fibrillation with RVR --Cardiology following, appreciate assistance --Amiodarone drip --Metoprolol tartrate 25 mg p.o. 3 times daily --Eliquis 5 mg p.o. twice daily --Continue monitor on telemetry  Acute renal failure: Resolved Creatinine on admission 2.03, improved with IV fluid hydration and treatment of UTI with antibiotics to 1.04. --Repeat BMP in the a.m.  Depression/anxiety: --Lexapro 5 mg p.o. daily  Herpes zoster ophthalmicus --Valtrex 1000 mg p.o. 3 times daily --Prednisolone eyedrops 3 times daily --Outpatient follow-up with Dr. Wallace Going  BPH: Finasteride 5 mg p.o. daily  Type 2 diabetes mellitus On metformin at home. --Hold oral hypoglycemics while inpatient --SSI for coverage --CBGs qAC/HS  Hx tremors Patient reports never been diagnosed with Parkinson's disease. --Resume home carbidopa/levodopa  Neuropathy: Gabapentin  Hyperlipidemia: Atorvastatin 20 mg p.o. daily  Insomnia: --Trazodone 100 mg p.o. nightly as needed  DVT prophylaxis: Place TED hose Start: 03/16/21 1830 apixaban (ELIQUIS) tablet 5 mg    Code Status: DNR  Family Communication: No family present at bedside this morning  Disposition Plan:  Level of care:  Telemetry Medical Status is: Inpatient  Remains inpatient appropriate because: Remains on amiodarone drip, awaiting cardiology signed off   Consultants:  Discussed with urology, Dr. Erlene Quan 12/21  Procedures:  None  Antimicrobials:  Vancomycin 12/16 -12/16 Metronidazole 12/16 - 12/16 Cefepime 12/16 - 12/18 Ceftriaxone 12/19 12/19   Subjective: Patient seen examined bedside, resting comfortably.  Requesting to be "cleaned up" after having bowel movement this morning.  Remains on amiodarone drip.  No other questions or concerns at this time.  RN concerned about decreased urine output and urinary catheter and patient reports leakage around suprapubic catheter.  Discussed with urology, Dr. Erlene Quan this morning; she requests RN for replacement/exchange of suprapubic catheter; and to continue oxybutynin and no further recommendations.  No other questions or concerns at this time from the patient.  No family present at bedside.  Denies headache, no dizziness, no chest pain, no shortness of breath, no abdominal pain, no nausea/vomiting, no fever/chills/night sweats, no vomiting, no paresthesias.  No acute events overnight per nursing staff.  Objective: Vitals:   03/21/21 0745 03/21/21 1058 03/21/21 1100 03/21/21 1529  BP: 120/78 103/79 103/79 (!) 87/67  Pulse: 85 87  77  Resp:  18  19  Temp: 98.3 F (36.8 C) 98.2 F (36.8 C)  98.2 F (36.8 C)  TempSrc: Oral Oral  Oral  SpO2: 98% 96%  96%  Height:        Intake/Output Summary (Last 24 hours) at 03/21/2021 1607 Last data filed at 03/21/2021 1404 Gross per 24 hour  Intake 2143.44 ml  Output 125 ml  Net 2018.44 ml   There were no vitals filed for this visit.  Examination:  General exam: Appears calm and comfortable, chronically ill in appearance Respiratory system: Clear to auscultation. Respiratory effort normal.  On room air Cardiovascular system: S1 & S2 heard, RRR. No JVD, murmurs, rubs, gallops or clicks. No pedal  edema. Gastrointestinal system: Abdomen mildly distended, soft and nontender. No organomegaly or masses felt. Normal bowel sounds heard.  Suprapubic catheter noted Central nervous system: Alert and oriented. No focal neurological deficits. Extremities: Symmetric 5 x 5 power. Skin: No rashes, lesions or ulcers Psychiatry: Judgement and insight appear poor. Mood & affect appropriate.     Data Reviewed: I have personally reviewed following labs and imaging studies  CBC: Recent Labs  Lab 03/16/21 1551 03/17/21 0608 03/19/21 0557  WBC 11.3* 9.9 9.4  NEUTROABS 8.0* 6.8 6.6  HGB 15.2 13.5 12.8*  HCT 46.1 40.8 38.8*  MCV 94.1 92.3 94.4  PLT 287 220 [id number]   Basic Metabolic Panel: Recent Labs  Lab 03/16/21 1551 03/17/21 0608 03/18/21 0454 03/19/21 0557  NA 138 144 140 141  K 3.2* 3.2* 3.9 3.5  CL 100 107 111 112*  CO2 28 27 21* 22  GLUCOSE 119* 90 102* 123*  BUN 51* 42* 33* 28*  CREATININE 2.03* 1.44* 1.09 1.04  CALCIUM 9.0 8.4* 8.5* 8.6*  MG  --  1.4* 2.3  --   PHOS  --  2.9  --   --    GFR: Estimated Creatinine Clearance: 57.5 mL/min (by C-G formula based on SCr of 1.04 mg/dL). Liver Function Tests: Recent Labs  Lab 03/16/21 1551 03/19/21 0557  AST 16 13*  ALT 8 <5  ALKPHOS 78 72  BILITOT 1.0 0.9  PROT 6.9 5.5*  ALBUMIN 3.4* 2.7*   No results for input(s): LIPASE, AMYLASE  in the last 168 hours. No results for input(s): AMMONIA in the last 168 hours. Coagulation Profile: Recent Labs  Lab 03/16/21 1551 03/17/21 0608  INR 2.2* 2.1*   Cardiac Enzymes: No results for input(s): CKTOTAL, CKMB, CKMBINDEX, TROPONINI in the last 168 hours. BNP (last 3 results) No results for input(s): PROBNP in the last 8760 hours. HbA1C: No results for input(s): HGBA1C in the last 72 hours. CBG: Recent Labs  Lab 03/20/21 1100 03/20/21 1828 03/20/21 2113 03/21/21 0748 03/21/21 1100  GLUCAP 167* 125* 135* 116* 114*   Lipid Profile: No results for input(s): CHOL, HDL,  LDLCALC, TRIG, CHOLHDL, LDLDIRECT in the last 72 hours. Thyroid Function Tests: No results for input(s): TSH, T4TOTAL, FREET4, T3FREE, THYROIDAB in the last 72 hours. Anemia Panel: No results for input(s): VITAMINB12, FOLATE, FERRITIN, TIBC, IRON, RETICCTPCT in the last 72 hours. Sepsis Labs: Recent Labs  Lab 03/16/21 1554 03/16/21 1838 03/16/21 2253  PROCALCITON  --  <0.10  --   LATICACIDVEN 3.8* 3.9* 3.9*    Recent Results (from the past 240 hour(s))  Culture, blood (routine x 2)     Status: None   Collection Time: 03/16/21  3:51 PM   Specimen: BLOOD  Result Value Ref Range Status   Specimen Description BLOOD RIGHT [facility]  Final   Special Requests   Final    BOTTLES DRAWN AEROBIC AND ANAEROBIC Blood Culture adequate volume   Culture   Final    NO GROWTH 5 DAYS Performed at [facility], [address] [place], [place] [address]    Report Status 03/21/2021 FINAL  Final  Resp Panel by RT-PCR (Flu A&B, Covid) Nasopharyngeal Swab     Status: None   Collection Time: 03/16/21  3:51 PM   Specimen: Nasopharyngeal Swab; Nasopharyngeal(NP) swabs in vial transport medium  Result Value Ref Range Status   SARS Coronavirus 2 by RT PCR NEGATIVE NEGATIVE Final    Comment: (NOTE) SARS-CoV-2 target nucleic acids are NOT DETECTED.  The SARS-CoV-2 RNA is generally detectable in upper respiratory specimens during the acute phase of infection. The lowest concentration of SARS-CoV-2 viral copies this assay can detect is 138 copies/mL. A negative result does not preclude SARS-Cov-2 infection and should not be used as the sole basis for treatment or other patient management decisions. A negative result may occur with  improper specimen collection/handling, submission of specimen other than nasopharyngeal swab, presence of viral mutation(s) within the areas targeted by this assay, and inadequate number of viral copies(<138 copies/mL). A negative result must be combined with clinical  observations, patient history, and epidemiological information. The expected result is Negative.  Fact Sheet for Patients:  EntrepreneurPulse.com.au  Fact Sheet for Healthcare Providers:  IncredibleEmployment.be  This test is no t yet approved or cleared by the [place] FDA and  has been authorized for detection and/or diagnosis of SARS-CoV-2 by FDA under an Emergency Use Authorization (EUA). This EUA will remain  in effect (meaning this test can be used) for the duration of the COVID-19 declaration under Section 564(b)(1) of the Act, 21 U.S.C.section 360bbb-3(b)(1), unless the authorization is terminated  or revoked sooner.       Influenza A by PCR NEGATIVE NEGATIVE Final   Influenza B by PCR NEGATIVE NEGATIVE Final    Comment: (NOTE) The Xpert Xpress SARS-CoV-2/FLU/RSV plus assay is intended as an aid in the diagnosis of influenza from Nasopharyngeal swab specimens and should not be used as a sole basis for treatment. Nasal washings and aspirates are unacceptable  for Xpert Xpress SARS-CoV-2/FLU/RSV testing.  Fact Sheet for Patients: BloggerCourse.com  Fact Sheet for Healthcare Providers: SeriousBroker.it  This test is not yet approved or cleared by the [place] FDA and has been authorized for detection and/or diagnosis of SARS-CoV-2 by FDA under an Emergency Use Authorization (EUA). This EUA will remain in effect (meaning this test can be used) for the duration of the COVID-19 declaration under Section 564(b)(1) of the Act, 21 U.S.C. section 360bbb-3(b)(1), unless the authorization is terminated or revoked.  Performed at [facility], [address], [place], [place] [address]   Urine Culture     Status: Abnormal   Collection Time: 03/16/21  6:38 PM   Specimen: Urine, Random  Result Value Ref Range Status   Specimen Description   Final    URINE, RANDOM Performed  at [facility], [address], [place], [place] [address]    Special Requests   Final    NONE Performed at [facility], [address] [place], [place] [address]    Culture (A)  Final    80,000 COLONIES/mL ESCHERICHIA COLI Confirmed Extended Spectrum Beta-Lactamase Producer (ESBL).  In bloodstream infections from ESBL organisms, carbapenems are preferred over piperacillin/tazobactam. They are shown to have a lower risk of mortality.    Report Status 03/19/2021 FINAL  Final   Organism ID, Bacteria ESCHERICHIA COLI (A)  Final      Susceptibility   Escherichia coli - MIC*    AMPICILLIN >=32 RESISTANT Resistant     CEFAZOLIN >=64 RESISTANT Resistant     CEFEPIME 16 RESISTANT Resistant     CEFTRIAXONE >=64 RESISTANT Resistant     CIPROFLOXACIN >=4 RESISTANT Resistant     GENTAMICIN <=1 SENSITIVE Sensitive     IMIPENEM <=0.25 SENSITIVE Sensitive     NITROFURANTOIN <=16 SENSITIVE Sensitive     TRIMETH/SULFA >=320 RESISTANT Resistant     AMPICILLIN/SULBACTAM >=32 RESISTANT Resistant     PIP/TAZO <=4 SENSITIVE Sensitive     * 80,000 COLONIES/mL ESCHERICHIA COLI  Culture, blood (routine x 2)     Status: None   Collection Time: 03/16/21 10:53 PM   Specimen: BLOOD  Result Value Ref Range Status   Specimen Description BLOOD BLOOD RIGHT HAND  Final   Special Requests   Final    BOTTLES DRAWN AEROBIC AND ANAEROBIC Blood Culture adequate volume   Culture   Final    NO GROWTH 5 DAYS Performed at [facility], [address] [place], [place] [address]    Report Status 03/21/2021 FINAL  Final         Radiology Studies: DG Abd 1 View  Result Date: 03/21/2021 CLINICAL DATA:  Ileus, history of appendectomy EXAM: ABDOMEN - 1 VIEW COMPARISON:  Abdominal radiograph dated March 19, 2021 FINDINGS: Gaseous distention of multiple bowel loops in a nonobstructive pattern. The findings suggest ileus. Bilateral hip arthroplasty. 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 IMPRESSION: Gaseous distention of multiple bowel lobes likely representing ileus, unchanged. Electronically Signed   By: Larose Hires D.O.   On: 03/21/2021 09:39        Scheduled Meds:  apixaban  5 mg Oral BID   atorvastatin  20 mg Oral QHS   bisacodyl  10 mg Rectal Daily   carbidopa-levodopa  1 tablet Oral TID AC   escitalopram  5 mg Oral Daily   feeding supplement (GLUCERNA SHAKE)  237 mL Oral BID BM   finasteride  5 mg Oral Daily  fluticasone  1 spray Each Nare Daily   fluticasone furoate-vilanterol  1 puff Inhalation Daily   And   umeclidinium bromide  1 puff Inhalation Daily   gabapentin  800 mg Oral BID   insulin aspart  0-5 Units Subcutaneous QHS   insulin aspart  0-9 Units Subcutaneous TID WC   ipratropium-albuterol  3 mL Inhalation Daily   metoprolol tartrate  25 mg Oral TID   midodrine  5 mg Oral TID   multivitamin with minerals  1 tablet Oral Daily   oxybutynin  10 mg Oral Daily   [START ON 03/22/2021] polyethylene glycol  17 g Oral Daily   prednisoLONE acetate  1 drop Both Eyes QID   Ensure Max Protein  11 oz Oral QHS   senna-docusate  1 tablet Oral BID   valACYclovir  1,000 mg Oral TID   Continuous Infusions:  sodium chloride 10 mL/hr at 03/18/21 0310   amiodarone 30 mg/hr (03/21/21 1349)     LOS: 4 days    Time spent: 52 minutes spent on chart review, discussion with nursing staff, consultants, updating family and interview/physical exam; more than 50% of that time was spent in counseling and/or coordination of care.    Chezney Huether J [place] DO [facility] Available via Epic secure chat 7am-7pm After these hours, please refer to coverage provider listed on [email or web address] 03/21/2021, 4:07 PM

## 2021-03-22 ENCOUNTER — Inpatient Hospital Stay: Payer: No Typology Code available for payment source

## 2021-03-22 DIAGNOSIS — R6521 Severe sepsis with septic shock: Secondary | ICD-10-CM | POA: Diagnosis not present

## 2021-03-22 DIAGNOSIS — A419 Sepsis, unspecified organism: Secondary | ICD-10-CM | POA: Diagnosis not present

## 2021-03-22 LAB — CBC
HCT: 36.1 % — ABNORMAL LOW (ref 39.0–52.0)
Hemoglobin: 12 g/dL — ABNORMAL LOW (ref 13.0–17.0)
MCH: 31.1 pg (ref 26.0–34.0)
MCHC: 33.2 g/dL (ref 30.0–36.0)
MCV: 93.5 fL (ref 80.0–100.0)
Platelets: 162 10*3/uL (ref 150–400)
RBC: 3.86 MIL/uL — ABNORMAL LOW (ref 4.22–5.81)
RDW: 15.7 % — ABNORMAL HIGH (ref 11.5–15.5)
WBC: 7.3 10*3/uL (ref 4.0–10.5)
nRBC: 0.7 % — ABNORMAL HIGH (ref 0.0–0.2)

## 2021-03-22 LAB — MAGNESIUM: Magnesium: 2 mg/dL (ref 1.7–2.4)

## 2021-03-22 LAB — BASIC METABOLIC PANEL
Anion gap: 5 (ref 5–15)
BUN: 24 mg/dL — ABNORMAL HIGH (ref 8–23)
CO2: 25 mmol/L (ref 22–32)
Calcium: 9 mg/dL (ref 8.9–10.3)
Chloride: 105 mmol/L (ref 98–111)
Creatinine, Ser: 1.04 mg/dL (ref 0.61–1.24)
GFR, Estimated: >60 mL/min (ref >60)
Glucose, Bld: 100 mg/dL — ABNORMAL HIGH (ref 70–99)
Potassium: 4.2 mmol/L (ref 3.5–5.1)
Sodium: 135 mmol/L (ref 135–145)

## 2021-03-22 LAB — GLUCOSE, CAPILLARY
Glucose-Capillary: 97 mg/dL (ref 70–99)
Glucose-Capillary: 107 mg/dL — ABNORMAL HIGH (ref 70–99)
Glucose-Capillary: 116 mg/dL — ABNORMAL HIGH (ref 70–99)
Glucose-Capillary: 133 mg/dL — ABNORMAL HIGH (ref 70–99)

## 2021-03-22 LAB — D-DIMER, QUANTITATIVE: D-Dimer, Quant: <0.27 ug/mL-FEU (ref 0.00–0.50)

## 2021-03-22 MED ORDER — FUROSEMIDE 10 MG/ML IJ SOLN
40.0000 mg | Freq: Two times a day (BID) | INTRAMUSCULAR | Status: DC
Start: 2021-03-22 — End: 2021-03-26
  Administered 2021-03-22 – 2021-03-26 (×9): 40 mg via INTRAVENOUS
  Filled 2021-03-22 (×9): qty 4

## 2021-03-22 MED ORDER — MIDODRINE HCL 5 MG PO TABS
2.5000 mg | ORAL_TABLET | Freq: Three times a day (TID) | ORAL | Status: DC
  Administered 2021-03-22 – 2021-03-23 (×3): 2.5 mg via ORAL
  Filled 2021-03-22 (×3): qty 1

## 2021-03-22 NOTE — Progress Notes (Signed)
SUBJECTIVE: Raymond Hays is a 75 y.o. male with medical history significant for COPD, non-insulin-dependent diabetes mellitus, hypertension, history of hyperlipidemia, paroxysmal atrial fibrillation, osteoarthritis, history of spinal stenosis, on anticoagulation with eliquis, urinary retention, chronic indwelling Foley catheter, who presents emergency department for chief concerns of generalized weakness and health decline for 3 days.   Patient currently in atrial fibrillation with heart rate of 84 bpm. Patient currently on IV amiodarone and Lopressor 25 mg three times a day.   Patient denies chest pain, shortness of breath.    Vitals:   03/21/21 2339 03/22/21 0600 03/22/21 0610 03/22/21 0700  BP: (!) 152/129 (!) 150/139 (!) 156/134 138/83  Pulse: 85 80  87  Resp: 16 13    Temp: 98.3 F (36.8 C) 98.4 F (36.9 C)  98.6 F (37 C)  TempSrc: Oral Oral  Oral  SpO2: 96%     Height:        Intake/Output Summary (Last 24 hours) at 03/22/2021 0829 Last data filed at 03/22/2021 0413 Gross per 24 hour  Intake 950.15 ml  Output 200 ml  Net 750.15 ml    LABS: Basic Metabolic Panel: Recent Labs    03/22/21 0653  NA 135  K 4.2  CL 105  CO2 25  GLUCOSE 100*  BUN 24*  CREATININE 1.04  CALCIUM 9.0  MG 2.0   Liver Function Tests: No results for input(s): AST, ALT, ALKPHOS, BILITOT, PROT, ALBUMIN in the last 72 hours. No results for input(s): LIPASE, AMYLASE in the last 72 hours. CBC: Recent Labs    03/22/21 0653  WBC 7.3  HGB 12.0*  HCT 36.1*  MCV 93.5  PLT 162   Cardiac Enzymes: No results for input(s): CKTOTAL, CKMB, CKMBINDEX, TROPONINI in the last 72 hours. BNP: Invalid input(s): POCBNP D-Dimer: No results for input(s): DDIMER in the last 72 hours. Hemoglobin A1C: No results for input(s): HGBA1C in the last 72 hours. Fasting Lipid Panel: No results for input(s): CHOL, HDL, LDLCALC, TRIG, CHOLHDL, LDLDIRECT in the last 72 hours. Thyroid Function Tests: No results for  input(s): TSH, T4TOTAL, T3FREE, THYROIDAB in the last 72 hours.  Invalid input(s): FREET3 Anemia Panel: No results for input(s): VITAMINB12, FOLATE, FERRITIN, TIBC, IRON, RETICCTPCT in the last 72 hours.   PHYSICAL EXAM General: Well developed, well nourished, in no acute distress HEENT:  Normocephalic and atramatic Neck:  No JVD.  Lungs: Clear bilaterally to auscultation and percussion. Heart: HRRR . Normal S1 and S2 without gallops or murmurs.  Abdomen: Bowel sounds are positive, abdomen soft and non-tender  Msk:  Back normal, normal gait. Normal strength and tone for age. Extremities: No clubbing, cyanosis or edema.   Neuro: Alert and oriented X 3. Psych:  Good affect, responds appropriately  TELEMETRY: sinus rhythm, HR 83 bpm  ASSESSMENT AND PLAN:  Patient denies chest pain, shortness of breath. Patient converted to sinus rhythm. HR improved. Will continue current medications.   Principal Problem:   Severe sepsis with septic shock (HCC) Active Problems:   AF (paroxysmal atrial fibrillation) (HCC)   Benign prostatic hyperplasia with lower urinary tract symptoms   Essential hypertension   Sepsis (HCC)   Iron deficiency anemia   E. coli UTI   Herpes zoster conjunctivitis of left eye    Cleotis Sparr, FNP-C 03/22/2021 8:29 AM

## 2021-03-22 NOTE — Progress Notes (Signed)
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  Patient reports pain in the right side of his temples and difficult for him to open his eyes due to shingles.  Patient reports compliance with his valacyclovir for his shingle treatment.  Patient denies chest pain, no shortness of breath, no abdominal pain, no hematuria, no diarrhea, no headaches.  In the ED, sodium 138, potassium 3.2, chloride 100, CO2 28, BUN 51, creatinine 2.03, glucose 119, WBC 11.3, hemoglobin 15.2, platelets 287, high sensitive troponin 11.  Lactic acid 3.8, INR 2.2.  In the ED, EDP started cefepime and vancomycin, 2.5 L LR bolus.  Hospitalist service was consulted for further evaluation and management of severe sepsis likely secondary to urinary tract infection.   Assessment & Plan:   Principal Problem:   Severe sepsis with septic shock (HCC) Active Problems:   AF (paroxysmal atrial fibrillation) (HCC)   Benign prostatic hyperplasia with lower urinary tract symptoms   Essential hypertension   Sepsis (HCC)   Iron deficiency anemia   E. coli UTI   Herpes zoster conjunctivitis of left eye   Severe sepsis, POA ESBL E. coli UTI Patient presenting to ED with generalized weakness and was found to have an elevated WBC count of 11.3 in the setting of chronic suprapubic Foley catheter, with lactic acid 3.8, INR 2.2.  Patient was found to be hypotensive, but responsive to IV fluid bolus.  Patient was reportedly confused with  associated acute renal failure at time of admission.  Was started on empiric antibiotics with vancomycin and cefepime.  Urinalysis with large leukocytes, positive nitrite, many bacteria, greater than 50 WBCs.  Urine culture positive for ESBL E. coli.  Blood cultures negative x 5 days.  Blood pressure is now stabilized and midodrine was titrated off.  Completed course of antibiotics with cefepime/ceftriaxone.  Suprapubic catheter exchanged on 03/21/2021. --Midodrine titrated down to 2.5 mg p.o. 3 times daily  Severe constipation with abdominal distention KUB notable for dynamic ileus with stool. --Senokot-S1 tablet twice daily --MiraLAX once daily --Bisacodyl 10 mg PR daily --Lactulose 20 g p.o. BID PRN --Strict I's and O's  Paroxysmal atrial fibrillation with RVR --Cardiology following, appreciate assistance --Amiodarone drip --Metoprolol tartrate 25 mg p.o. 3 times daily --Eliquis 5 mg p.o. twice daily --Continue monitor on telemetry  Lower extremity edema Patient reports worsening lower extremity edema.  D-dimer on admission within normal limits. --Recheck D-dimer --Furosemide 40 mg IV every 12 hours  Acute renal failure: Resolved Creatinine on admission 2.03, improved with IV fluid hydration and treatment of UTI with antibiotics to 1.04. --BMP daily  Depression/anxiety: --Lexapro 5 mg p.o. daily  Herpes zoster ophthalmicus --Valtrex 1000 mg p.o. 3 times daily --Prednisolone eyedrops 3 times daily --Outpatient follow-up with Dr. Wallace Going  BPH: Finasteride 5 mg p.o. daily  Type 2 diabetes mellitus On metformin at home. --Hold oral hypoglycemics while inpatient --SSI for coverage --CBGs qAC/HS  Hx tremors Patient reports never been diagnosed with Parkinson's disease. --Resume home carbidopa/levodopa  Neuropathy: Gabapentin  Hyperlipidemia: Atorvastatin 20 mg p.o. daily  Insomnia: --Trazodone 100 mg  p.o. nightly as needed  DVT prophylaxis: Place TED hose Start:  03/16/21 1830 apixaban (ELIQUIS) tablet 5 mg    Code Status: DNR Family Communication: No family present at bedside this morning  Disposition Plan:  Level of care: Telemetry Medical Status is: Inpatient  Remains inpatient appropriate because: Remains on amiodarone drip, awaiting cardiology sign off   Consultants:  Discussed with urology, Dr. Erlene Quan 12/21  Procedures:  None  Antimicrobials:  Vancomycin 12/16 -12/16 Metronidazole 12/16 - 12/16 Cefepime 12/16 - 12/18 Ceftriaxone 12/19 12/19   Subjective: Patient seen examined bedside, resting comfortably.  Complaining of increasing cough with shortness of breath and lower extremity edema.  Suprapubic catheter exchanged yesterday.  No family present at bedside.  Remains on IV amiodarone drip.  Seen by cardiology this morning. No other questions or concerns at this time from the patient.  No family present at bedside.  Denies headache, no dizziness, no chest pain, no abdominal pain, no nausea/vomiting, no fever/chills/night sweats, no vomiting, no paresthesias.  No acute events overnight per nursing staff.  Objective: Vitals:   03/21/21 2339 03/22/21 0600 03/22/21 0610 03/22/21 0700  BP: (!) 152/129 (!) 150/139 (!) 156/134 138/83  Pulse: 85 80  87  Resp: 16 13    Temp: 98.3 F (36.8 C) 98.4 F (36.9 C)  98.6 F (37 C)  TempSrc: Oral Oral  Oral  SpO2: 96%     Height:        Intake/Output Summary (Last 24 hours) at 03/22/2021 1155 Last data filed at 03/22/2021 0413 Gross per 24 hour  Intake 710.15 ml  Output 200 ml  Net 510.15 ml   There were no vitals filed for this visit.  Examination:  General exam: Appears calm and comfortable, chronically ill in appearance Respiratory system: Coarse breath sounds bilaterally, normal respiratory effort, on room air.   Cardiovascular system: S1 & S2 heard, RRR. No JVD, murmurs, rubs, gallops or clicks. No pedal edema. Gastrointestinal system: Abdomen mildly distended, soft and  nontender. No organomegaly or masses felt. Normal bowel sounds heard.  Suprapubic catheter noted Central nervous system: Alert and oriented. No focal neurological deficits. Extremities: Symmetric 5 x 5 power. Skin: No rashes, lesions or ulcers Psychiatry: Judgement and insight appear poor. Mood & affect appropriate.     Data Reviewed: I have personally reviewed following labs and imaging studies  CBC: Recent Labs  Lab 03/16/21 1551 03/17/21 0608 03/19/21 0557 03/22/21 0653  WBC 11.3* 9.9 9.4 7.3  NEUTROABS 8.0* 6.8 6.6  --   HGB 15.2 13.5 12.8* 12.0*  HCT 46.1 40.8 38.8* 36.1*  MCV 94.1 92.3 94.4 93.5  PLT 287 220 220 0000000   Basic Metabolic Panel: Recent Labs  Lab 03/16/21 1551 03/17/21 0608 03/18/21 0454 03/19/21 0557 03/22/21 0653  NA 138 144 140 141 135  K 3.2* 3.2* 3.9 3.5 4.2  CL 100 107 111 112* 105  CO2 28 27 21* 22 25  GLUCOSE 119* 90 102* 123* 100*  BUN 51* 42* 33* 28* 24*  CREATININE 2.03* 1.44* 1.09 1.04 1.04  CALCIUM 9.0 8.4* 8.5* 8.6* 9.0  MG  --  1.4* 2.3  --  2.0  PHOS  --  2.9  --   --   --    GFR: Estimated Creatinine Clearance: 57.5 mL/min (by C-G formula based on SCr of 1.04 mg/dL). Liver Function Tests: Recent Labs  Lab 03/16/21 1551 03/19/21 0557  AST 16 13*  ALT 8 <5  ALKPHOS 78 72  BILITOT 1.0 0.9  PROT 6.9 5.5*  ALBUMIN 3.4* 2.7*   No results for input(s): LIPASE, AMYLASE in the last 168 hours. No results for input(s): AMMONIA in the last 168 hours. Coagulation Profile: Recent Labs  Lab 03/16/21 1551 03/17/21 0608  INR 2.2* 2.1*   Cardiac Enzymes: No results for input(s): CKTOTAL, CKMB, CKMBINDEX, TROPONINI in the last 168 hours. BNP (last 3 results) No results for input(s): PROBNP in the last 8760 hours. HbA1C: No results for input(s): HGBA1C in the last 72 hours. CBG: Recent Labs  Lab 03/21/21 0748 03/21/21 1100 03/21/21 1633 03/21/21 2120 03/22/21 0735  GLUCAP 116* 114* 114* 116* 97   Lipid Profile: No  results for input(s): CHOL, HDL, LDLCALC, TRIG, CHOLHDL, LDLDIRECT in the last 72 hours. Thyroid Function Tests: No results for input(s): TSH, T4TOTAL, FREET4, T3FREE, THYROIDAB in the last 72 hours. Anemia Panel: No results for input(s): VITAMINB12, FOLATE, FERRITIN, TIBC, IRON, RETICCTPCT in the last 72 hours. Sepsis Labs: Recent Labs  Lab 03/16/21 1554 03/16/21 1838 03/16/21 2253  PROCALCITON  --  <0.10  --   LATICACIDVEN 3.8* 3.9* 3.9*    Recent Results (from the past 240 hour(s))  Culture, blood (routine x 2)     Status: None   Collection Time: 03/16/21  3:51 PM   Specimen: BLOOD  Result Value Ref Range Status   Specimen Description BLOOD RIGHT [facility]  Final   Special Requests   Final    BOTTLES DRAWN AEROBIC AND ANAEROBIC Blood Culture adequate volume   Culture   Final    NO GROWTH 5 DAYS Performed at [facility], [address] [place], [place] [address]    Report Status 03/21/2021 FINAL  Final  Resp Panel by RT-PCR (Flu A&B, Covid) Nasopharyngeal Swab     Status: None   Collection Time: 03/16/21  3:51 PM   Specimen: Nasopharyngeal Swab; Nasopharyngeal(NP) swabs in vial transport medium  Result Value Ref Range Status   SARS Coronavirus 2 by RT PCR NEGATIVE NEGATIVE Final    Comment: (NOTE) SARS-CoV-2 target nucleic acids are NOT DETECTED.  The SARS-CoV-2 RNA is generally detectable in upper respiratory specimens during the acute phase of infection. The lowest concentration of SARS-CoV-2 viral copies this assay can detect is 138 copies/mL. A negative result does not preclude SARS-Cov-2 infection and should not be used as the sole basis for treatment or other patient management decisions. A negative result may occur with  improper specimen collection/handling, submission of specimen other than nasopharyngeal swab, presence of viral mutation(s) within the areas targeted by this assay, and inadequate number of viral copies(<138 copies/mL). A negative result  must be combined with clinical observations, patient history, and epidemiological information. The expected result is Negative.  Fact Sheet for Patients:  EntrepreneurPulse.com.au  Fact Sheet for Healthcare Providers:  IncredibleEmployment.be  This test is no t yet approved or cleared by the [place] FDA and  has been authorized for detection and/or diagnosis of SARS-CoV-2 by FDA under an Emergency Use Authorization (EUA). This EUA will remain  in effect (meaning this test can be used) for the duration of the COVID-19 declaration under Section 564(b)(1) of the Act, 21 U.S.C.section 360bbb-3(b)(1), unless the authorization is terminated  or revoked sooner.       Influenza A by PCR NEGATIVE NEGATIVE Final   Influenza B by PCR NEGATIVE NEGATIVE Final    Comment: (NOTE) The Xpert Xpress SARS-CoV-2/FLU/RSV plus assay is intended as an aid in the diagnosis of influenza from Nasopharyngeal swab specimens and should  not be used as a sole basis for treatment. Nasal washings and aspirates are unacceptable for Xpert Xpress SARS-CoV-2/FLU/RSV testing.  Fact Sheet for Patients: BloggerCourse.com  Fact Sheet for Healthcare Providers: SeriousBroker.it  This test is not yet approved or cleared by the [place] FDA and has been authorized for detection and/or diagnosis of SARS-CoV-2 by FDA under an Emergency Use Authorization (EUA). This EUA will remain in effect (meaning this test can be used) for the duration of the COVID-19 declaration under Section 564(b)(1) of the Act, 21 U.S.C. section 360bbb-3(b)(1), unless the authorization is terminated or revoked.  Performed at [facility], [address] [place], [place] [address]   Urine Culture     Status: Abnormal   Collection Time: 03/16/21  6:38 PM   Specimen: Urine, Random  Result Value Ref Range Status   Specimen Description    Final    URINE, RANDOM Performed at [facility], [address] [place], [place] [address]    Special Requests   Final    NONE Performed at [facility], [address] [place], [place] [address]    Culture (A)  Final    80,000 COLONIES/mL ESCHERICHIA COLI Confirmed Extended Spectrum Beta-Lactamase Producer (ESBL).  In bloodstream infections from ESBL organisms, carbapenems are preferred over piperacillin/tazobactam. They are shown to have a lower risk of mortality.    Report Status 03/19/2021 FINAL  Final   Organism ID, Bacteria ESCHERICHIA COLI (A)  Final      Susceptibility   Escherichia coli - MIC*    AMPICILLIN >=32 RESISTANT Resistant     CEFAZOLIN >=64 RESISTANT Resistant     CEFEPIME 16 RESISTANT Resistant     CEFTRIAXONE >=64 RESISTANT Resistant     CIPROFLOXACIN >=4 RESISTANT Resistant     GENTAMICIN <=1 SENSITIVE Sensitive     IMIPENEM <=0.25 SENSITIVE Sensitive     NITROFURANTOIN <=16 SENSITIVE Sensitive     TRIMETH/SULFA >=320 RESISTANT Resistant     AMPICILLIN/SULBACTAM >=32 RESISTANT Resistant     PIP/TAZO <=4 SENSITIVE Sensitive     * 80,000 COLONIES/mL ESCHERICHIA COLI  Culture, blood (routine x 2)     Status: None   Collection Time: 03/16/21 10:53 PM   Specimen: BLOOD  Result Value Ref Range Status   Specimen Description BLOOD BLOOD RIGHT HAND  Final   Special Requests   Final    BOTTLES DRAWN AEROBIC AND ANAEROBIC Blood Culture adequate volume   Culture   Final    NO GROWTH 5 DAYS Performed at [facility], [address] [place], [place] [address]    Report Status 03/21/2021 FINAL  Final         Radiology Studies: DG Abd 1 View  Result Date: 03/21/2021 CLINICAL DATA:  Ileus, history of appendectomy EXAM: ABDOMEN - 1 VIEW COMPARISON:  Abdominal radiograph dated March 19, 2021 FINDINGS: Gaseous distention of multiple bowel loops in a nonobstructive pattern. The findings suggest ileus. Bilateral hip arthroplasty.  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 IMPRESSION: Gaseous distention of multiple bowel lobes likely representing ileus, unchanged. Electronically Signed   By: Larose Hires D.O.   On: 03/21/2021 09:39   DG Chest Port 1 View  Result Date: 03/22/2021 CLINICAL DATA:  Shortness of breath EXAM: PORTABLE CHEST 1 VIEW COMPARISON:  03/16/2021 FINDINGS: The heart size and mediastinal contours are within normal limits. Shallow inspiration with low lung volumes. Minimal atelectasis at the right lung base. No pleural effusion or pneumothorax.  The visualized skeletal structures are unremarkable. IMPRESSION: Low lung volumes.  Minimal atelectasis at the right lung base. Electronically Signed   By: Macy Mis M.D.   On: 03/22/2021 11:10        Scheduled Meds:  apixaban  5 mg Oral BID   atorvastatin  20 mg Oral QHS   carbidopa-levodopa  1 tablet Oral TID AC   escitalopram  5 mg Oral Daily   feeding supplement (GLUCERNA SHAKE)  237 mL Oral BID BM   finasteride  5 mg Oral Daily   fluticasone  1 spray Each Nare Daily   fluticasone furoate-vilanterol  1 puff Inhalation Daily   And   umeclidinium bromide  1 puff Inhalation Daily   furosemide  40 mg Intravenous Q12H   gabapentin  800 mg Oral BID   insulin aspart  0-5 Units Subcutaneous QHS   insulin aspart  0-9 Units Subcutaneous TID WC   ipratropium-albuterol  3 mL Inhalation Daily   metoprolol tartrate  25 mg Oral TID   midodrine  5 mg Oral TID   multivitamin with minerals  1 tablet Oral Daily   oxybutynin  10 mg Oral Daily   polyethylene glycol  17 g Oral Daily   prednisoLONE acetate  1 drop Both Eyes QID   Ensure Max Protein  11 oz Oral QHS   senna-docusate  1 tablet Oral BID   valACYclovir  1,000 mg Oral TID   Continuous Infusions:  sodium chloride 10 mL/hr at 03/18/21 0310   amiodarone 30 mg/hr (03/22/21 0627)     LOS: 5 days    Time spent: 42 minutes spent on chart review, discussion with  nursing staff, consultants, updating family and interview/physical exam; more than 50% of that time was spent in counseling and/or coordination of care.    Astoria Condon J [place] DO [facility] Available via Epic secure chat 7am-7pm After these hours, please refer to coverage provider listed on [email or web address] 03/22/2021, 11:55 AM

## 2021-03-22 NOTE — Progress Notes (Signed)
Patients supra pubic cath was changed out by ICU charge nurse on 03/21/2021

## 2021-03-22 NOTE — Progress Notes (Signed)
OT Cancellation Note  Patient Details Name: Raymond Hays MRN: [id number] DOB: 1945/07/14   Cancelled Treatment:    Reason Eval/Treat Not Completed: Medical issues which prohibited therapy (Pt. is currently on Amiodarone Drip. Will continue to monitor, and attempt the inital OT evaluation at a later time, or date when appropriate.)  Vernelle Emerald, OTR/L 03/22/2021, 4:32 PM

## 2021-03-23 DIAGNOSIS — R6521 Severe sepsis with septic shock: Secondary | ICD-10-CM | POA: Diagnosis not present

## 2021-03-23 DIAGNOSIS — A419 Sepsis, unspecified organism: Secondary | ICD-10-CM | POA: Diagnosis not present

## 2021-03-23 LAB — BASIC METABOLIC PANEL
Anion gap: 6 (ref 5–15)
BUN: 23 mg/dL (ref 8–23)
CO2: 27 mmol/L (ref 22–32)
Calcium: 8.5 mg/dL — ABNORMAL LOW (ref 8.9–10.3)
Chloride: 102 mmol/L (ref 98–111)
Creatinine, Ser: 0.98 mg/dL (ref 0.61–1.24)
GFR, Estimated: >60 mL/min (ref >60)
Glucose, Bld: 110 mg/dL — ABNORMAL HIGH (ref 70–99)
Potassium: 3.2 mmol/L — ABNORMAL LOW (ref 3.5–5.1)
Sodium: 135 mmol/L (ref 135–145)

## 2021-03-23 LAB — GLUCOSE, CAPILLARY
Glucose-Capillary: 155 mg/dL — ABNORMAL HIGH (ref 70–99)
Glucose-Capillary: 173 mg/dL — ABNORMAL HIGH (ref 70–99)
Glucose-Capillary: 119 mg/dL — ABNORMAL HIGH (ref 70–99)
Glucose-Capillary: 105 mg/dL — ABNORMAL HIGH (ref 70–99)

## 2021-03-23 MED ORDER — HYDROCOD POLST-CPM POLST ER 10-8 MG/5ML PO SUER
5.0000 mL | Freq: Two times a day (BID) | ORAL | Status: DC | PRN
  Administered 2021-03-23: 5 mL via ORAL
  Filled 2021-03-23: qty 5

## 2021-03-23 MED ORDER — POTASSIUM CHLORIDE CRYS ER 20 MEQ PO TBCR
40.0000 meq | EXTENDED_RELEASE_TABLET | ORAL | Status: AC
  Administered 2021-03-23 (×2): 40 meq via ORAL
  Filled 2021-03-23 (×2): qty 2

## 2021-03-23 MED ORDER — AMIODARONE HCL 200 MG PO TABS
400.0000 mg | ORAL_TABLET | Freq: Two times a day (BID) | ORAL | Status: DC
  Administered 2021-03-23 – 2021-03-26 (×7): 400 mg via ORAL
  Filled 2021-03-23 (×7): qty 2

## 2021-03-23 NOTE — Progress Notes (Signed)
SUBJECTIVE: Raymond Hays is a 75 y.o. male with medical history significant for COPD, non-insulin-dependent diabetes mellitus, hypertension, history of hyperlipidemia, paroxysmal atrial fibrillation, osteoarthritis, history of spinal stenosis, on anticoagulation with eliquis, urinary retention, chronic indwelling Foley catheter, who presents emergency department for chief concerns of generalized weakness and health decline for 3 days.   Patient denies chest pain, shortness of breath, edema.    Vitals:   03/22/21 1547 03/22/21 1927 03/22/21 2329 03/23/21 0410  BP: (!) 127/110 97/63 109/60 126/83  Pulse: 80 78 86 82  Resp:  17 13 16   Temp: 98 F (36.7 C) 98.4 F (36.9 C) 98.3 F (36.8 C) 99 F (37.2 C)  TempSrc: Axillary Oral Oral   SpO2:  100% 91% 99%  Height:        Intake/Output Summary (Last 24 hours) at 03/23/2021 0836 Last data filed at 03/23/2021 0500 Gross per 24 hour  Intake --  Output 3900 ml  Net -3900 ml    LABS: Basic Metabolic Panel: Recent Labs    03/22/21 0653 03/23/21 0512  NA 135 135  K 4.2 3.2*  CL 105 102  CO2 25 27  GLUCOSE 100* 110*  BUN 24* 23  CREATININE 1.04 0.98  CALCIUM 9.0 8.5*  MG 2.0  --    Liver Function Tests: No results for input(s): AST, ALT, ALKPHOS, BILITOT, PROT, ALBUMIN in the last 72 hours. No results for input(s): LIPASE, AMYLASE in the last 72 hours. CBC: Recent Labs    03/22/21 0653  WBC 7.3  HGB 12.0*  HCT 36.1*  MCV 93.5  PLT 162   Cardiac Enzymes: No results for input(s): CKTOTAL, CKMB, CKMBINDEX, TROPONINI in the last 72 hours. BNP: Invalid input(s): POCBNP D-Dimer: Recent Labs    03/22/21 1146  DDIMER <0.27   Hemoglobin A1C: No results for input(s): HGBA1C in the last 72 hours. Fasting Lipid Panel: No results for input(s): CHOL, HDL, LDLCALC, TRIG, CHOLHDL, LDLDIRECT in the last 72 hours. Thyroid Function Tests: No results for input(s): TSH, T4TOTAL, T3FREE, THYROIDAB in the last 72 hours.  Invalid  input(s): FREET3 Anemia Panel: No results for input(s): VITAMINB12, FOLATE, FERRITIN, TIBC, IRON, RETICCTPCT in the last 72 hours.   PHYSICAL EXAM General: Well developed, well nourished, in no acute distress HEENT:  Normocephalic and atramatic Neck:  No JVD.  Lungs: Clear bilaterally to auscultation and percussion. Heart: HRRR . Normal S1 and S2 without gallops or murmurs.  Abdomen: Bowel sounds are positive, abdomen soft and non-tender  Msk:  Back normal, normal gait. Normal strength and tone for age. Extremities: No clubbing, cyanosis or edema.   Neuro: Alert and oriented X 3. Psych:  Good affect, responds appropriately  TELEMETRY: atrial fibrillation, HR 86 bpm  ASSESSMENT AND PLAN: Patient denies chest pain, shortness of breath. Patient in atrial fibrillation. HR improved. Will continue current medications.   Principal Problem:   Severe sepsis with septic shock (HCC) Active Problems:   AF (paroxysmal atrial fibrillation) (HCC)   Benign prostatic hyperplasia with lower urinary tract symptoms   Essential hypertension   Sepsis (HCC)   Iron deficiency anemia   E. coli UTI   Herpes zoster conjunctivitis of left eye    Alaiyah Bollman, FNP-C 03/23/2021 8:36 AM

## 2021-03-23 NOTE — Progress Notes (Signed)
Handoff report received from previous care giver Nolia Tschantz RN at bedside. Pt A&Ox2 with consistent cough. Pt denies any complaints to staff at this time. Pt in NAD at this time, VS stable

## 2021-03-23 NOTE — Evaluation (Signed)
Physical Therapy Evaluation Patient Details Name: Raymond Hays MRN: [id number] DOB: 14-Jun-1945 Today's Date: 03/23/2021  History of Present Illness  Raymond Hays is a 75 y.o. male with medical history significant for COPD, non-insulin-dependent diabetes mellitus, hypertension, history of hyperlipidemia, paroxysmal atrial fibrillation, osteoarthritis, history of spinal stenosis, on anticoagulation with eliquis, urinary retention, chronic indwelling Foley catheter, who presents emergency department for chief concerns of generalized weakness and health decline for 3 days. (Simultaneous filing. User may not have seen previous data.)   Clinical Impression  Pt admitted with above diagnosis. Pt received in bed agreeable to PT. Pt familiar to rehab department due to recent admissions earlier this year. Pt from [facility] and reports no change to baseline mobility, which is dependent for all OOB mobility and for ADL's/IADL's except for self feeding. Pt resting HR at 92-105 BPM in a-fib with Max HR attaining 122 BPM with transferring to EOB. Pt overall is a maxA+1 to attain EOB with mod to maxA to maintain static sitting balance despite BUE and BLE support. Pt has significantly limited B shoulder mobility which is limiting pt in transferring and ADL completion. Pt tolerating static sitting ~3 min then returned to supine with minA. HR returning to mid 90's at rest quickly. Requiring +22maxA to scoot up in bed. All needs in reach.  PT to sign off on orders as pt is at base line mobility.   Recommendations for follow up therapy are one component of a multi-disciplinary discharge planning process, led by the attending physician.  Recommendations may be updated based on patient status, additional functional criteria and insurance authorization.  Follow Up Recommendations No PT follow up    Assistance Recommended at Discharge Intermittent Supervision/Assistance  Functional Status Assessment Patient has not had a  recent decline in their functional status  Equipment Recommendations  None recommended by PT    Recommendations for Other Services       Precautions / Restrictions Precautions Precautions: Fall Restrictions Weight Bearing Restrictions: No      Mobility  Bed Mobility Overal bed mobility: Needs Assistance Bed Mobility: Supine to Sit;Sit to Supine     Supine to sit: Max assist;HOB elevated Sit to supine: Min assist;Max assist;+2 for physical assistance   General bed mobility comments: MaxA+2 to scoot up in bed. MinA to return to supine. Patient Response: Cooperative  Transfers Overall transfer level: Needs assistance                 General transfer comment: Sat EOB for ~3 min. At baseline pt relies on hoyer lift to transfer.    Ambulation/Gait               General Gait Details: unable  Stairs            Wheelchair Mobility    Modified Rankin (Stroke Patients Only)       Balance Overall balance assessment: Needs assistance Sitting-balance support: Bilateral upper extremity supported;Feet supported Sitting balance-Leahy Scale: Poor Sitting balance - Comments: Requires mod to maxA to attain sitting Postural control: Posterior lean;Left lateral lean   Standing balance-Leahy Scale: Zero                               Pertinent Vitals/Pain Pain Assessment: No/denies pain Breathing: normal Negative Vocalization: none Facial Expression: smiling or inexpressive Body Language: relaxed Consolability: no need to console PAINAD Score: 0    Home Living Family/patient expects to be discharged to::  Skilled nursing facility                   Additional Comments: Pt resides at [facility], he is hoyered to wheelchair when oob    Prior Function Prior Level of Function : Needs assist       Physical Assist : Mobility (physical)     Mobility Comments: requires max +2 for bed mobility, reports numbness in B LEs, and has B  mobility shoulder limitations       Hand Dominance   Dominant Hand: Right    Extremity/Trunk Assessment   Upper Extremity Assessment Upper Extremity Assessment: Generalized weakness;RUE deficits/detail;LUE deficits/detail RUE Deficits / Details: Pt with severe limitations in bilateral shoulders, able to active flex 30 degrees and uses more of elbow and hand for tasks LUE Deficits / Details: Pt with severe limitations in bilateral shoulders, able to active flex 30 degrees and uses more of elbow and hand for tasks            Communication   Communication: No difficulties  Cognition Arousal/Alertness: Awake/alert Behavior During Therapy: WFL for tasks assessed/performed Overall Cognitive Status: Within Functional Limits for tasks assessed                                          General Comments General comments (skin integrity, edema, etc.): HR up to 122 BPM in a-fib with transferring to EOB.    Exercises Other Exercises Other Exercises: Role of PT in acute setting   Assessment/Plan    PT Assessment Patient does not need any further PT services  PT Problem List         PT Treatment Interventions      PT Goals (Current goals can be found in the Care Plan section)  Acute Rehab PT Goals PT Goal Formulation: All assessment and education complete, DC therapy    Frequency     Barriers to discharge        Co-evaluation               AM-PAC PT "6 Clicks" Mobility  Outcome Measure Help needed turning from your back to your side while in a flat bed without using bedrails?: Total Help needed moving from lying on your back to sitting on the side of a flat bed without using bedrails?: Total Help needed moving to and from a bed to a chair (including a wheelchair)?: Total Help needed standing up from a chair using your arms (e.g., wheelchair or bedside chair)?: Total Help needed to walk in hospital room?: Total Help needed climbing 3-5 steps with a  railing? : Total 6 Click Score: 6    End of Session   Activity Tolerance: Patient tolerated treatment well Patient left: in bed;with call bell/phone within reach;with bed alarm set Nurse Communication: Mobility status;Need for lift equipment PT Visit Diagnosis: Muscle weakness (generalized) (M62.81);Other abnormalities of gait and mobility (R26.89)    Time: 1610-9604 PT Time Calculation (min) (ACUTE ONLY): 30 min   Charges:   PT Evaluation $PT Eval Moderate Complexity: 1 Mod PT Treatments $Therapeutic Activity: 8-22 mins        Raymond Hays M. Fairly IV, PT, DPT Physical Therapist- [place]  [facility]  03/23/2021, 1:07 PM

## 2021-03-23 NOTE — Evaluation (Signed)
Occupational Therapy Evaluation Patient Details Name: Raymond Hays MRN: [id number] DOB: May 11, 1945 Today's Date: 03/23/2021   History of Present Illness Shafin Pollio is a 75 y.o. male with medical history significant for COPD, non-insulin-dependent diabetes mellitus, hypertension, history of hyperlipidemia, paroxysmal atrial fibrillation, osteoarthritis, history of spinal stenosis, on anticoagulation with eliquis, urinary retention, chronic indwelling Foley catheter, who presents emergency department for chief concerns of generalized weakness and health decline for 3 days. (Simultaneous filing. User may not have seen previous data.)   Clinical Impression   Pt seen for OT evaluation.  Pt is alert, cooperative and oriented.  He reports he feels he is currently at his baseline level of function for self care tasks.  He is a long term resident of [facility], is dependent with most of his self care tasks.  The tasks he normally participates in are: bathing his face and chest, brushing teeth, feeding self.  He was able to demonstrate these tasks during evaluation.  He is dependent for bathing his back and LB, dressing, transfers and wheelchair mobility.  He has severe limitations in his bilateral shoulders with ROM and limited active movement in his left hand.  He reports being seen in the past for OT at the nursing home and has a splint for his left hand which he does not like to wear.  Therapist did instruct patient on PROM of left digits with use of his right dominant hand. He was able to demonstrate understanding and was able to perform.  Pt would not likely benefit from OT at this time since he appears to be at his baseline level of care.  If further needs arise, please reconsult OT.       Recommendations for follow up therapy are one component of a multi-disciplinary discharge planning process, led by the attending physician.  Recommendations may be updated based on patient status, additional  functional criteria and insurance authorization.   Follow Up Recommendations  Long-term institutional care without follow-up therapy    Assistance Recommended at Discharge Frequent or constant Supervision/Assistance  Functional Status Assessment  Patient has had a recent decline in their functional status and/or demonstrates limited ability to make significant improvements in function in a reasonable and predictable amount of time  Equipment Recommendations       Recommendations for Other Services       Precautions / Restrictions Precautions Precautions: Fall Restrictions Weight Bearing Restrictions: No      Mobility Bed Mobility Overal bed mobility: Needs Assistance                  Transfers Overall transfer level: Needs assistance                 General transfer comment: Will require hoyer lift for transfer to chair.      Balance                                           ADL either performed or assessed with clinical judgement   ADL Overall ADL's : At baseline                                       General ADL Comments: Pt resides at [facility] as a long term resident.  He has been dependent for  his basic self care tasks overall except he is able to feed self independently after tray set up, he can wash his face and chest during bathing, brush his teeth with setup from bed or wheelchair.  Staff provides assistance with bathing his back and lower body, dependent with UB and LB dressing, shaving and requires a hoyer lift for transfers to the wheelchair.  He is very limited in his shoulder ROM in the last few years and requires assist to propel wheelchair.  He reports he feels he is back to his baseline level of care.  During assessment, he could demonstrate self feeding, performed basic grooming tasks and could demonstrate his reported level of bathing with washing his face and chest.  He would not likely benefit from OT  at this time.     Vision Baseline Vision/History: 1 Wears glasses Patient Visual Report: No change from baseline       Perception     Praxis      Pertinent Vitals/Pain Pain Assessment: No/denies pain Breathing: normal Negative Vocalization: none Facial Expression: smiling or inexpressive Body Language: relaxed Consolability: no need to console PAINAD Score: 0     Hand Dominance Right   Extremity/Trunk Assessment Upper Extremity Assessment Upper Extremity Assessment: Generalized weakness;RUE deficits/detail;LUE deficits/detail RUE Deficits / Details: Pt with severe limitations in bilateral shoulders, able to active flex 30 degrees and uses more of elbow and hand for tasks LUE Deficits / Details: Pt with severe limitations in bilateral shoulders, able to active flex 30 degrees and uses more of elbow and hand for tasks           Communication Communication Communication: No difficulties   Cognition Arousal/Alertness: Awake/alert Behavior During Therapy: WFL for tasks assessed/performed Overall Cognitive Status: Within Functional Limits for tasks assessed                                       General Comments       Exercises Other Exercises Other Exercises: Pt instructed on ROM for his left hand/digits, he is unable to fully extend actively and would benefit from performing daily self directed ROM.  He demonstrates ability and understanding.  He reports he has a splint for his left hand that OT provided in the past but he does not like to wear it at night.   Shoulder Instructions      Home Living Family/patient expects to be discharged to:: Skilled nursing facility                                 Additional Comments: Pt resides at [facility], he is hoyered to wheelchair when oob      Prior Functioning/Environment Prior Level of Function : Needs assist       Physical Assist : Mobility (physical)     Mobility Comments:  requires max +2 for bed mobility, reports numbness in B LEs, and has B mobility shoulder limitations          OT Problem List: Decreased strength;Impaired UE functional use;Decreased range of motion      OT Treatment/Interventions:      OT Goals(Current goals can be found in the care plan section) Acute Rehab OT Goals Patient Stated Goal: Pt reports he is at his baseline and would like to return to [facility] OT Goal Formulation: With  patient Time For Goal Achievement: 03/31/21 Potential to Achieve Goals: Fair  OT Frequency:     Barriers to D/C:            Co-evaluation              AM-PAC OT "6 Clicks" Daily Activity     Outcome Measure Help from another person eating meals?: None Help from another person taking care of personal grooming?: A Little Help from another person toileting, which includes using toliet, bedpan, or urinal?: Total Help from another person bathing (including washing, rinsing, drying)?: A Lot Help from another person to put on and taking off regular upper body clothing?: Total Help from another person to put on and taking off regular lower body clothing?: Total 6 Click Score: 12   End of Session    Activity Tolerance: Patient tolerated treatment well Patient left: in bed;with call bell/phone within reach;with bed alarm set  OT Visit Diagnosis: Muscle weakness (generalized) (M62.81)                Time: 1105-1130 OT Time Calculation (min): 25 min Charges:  OT General Charges $OT Visit: 1 Visit OT Evaluation $OT Eval Low Complexity: 1 Low  Noelie Renfrow T Manvir Thorson, OTR/L Jaysin Gayler 03/23/2021, 12:10 PM

## 2021-03-23 NOTE — Progress Notes (Signed)
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  Patient reports pain in the right side of his temples and difficult for him to open his eyes due to shingles.  Patient reports compliance with his valacyclovir for his shingle treatment.  Patient denies chest pain, no shortness of breath, no abdominal pain, no hematuria, no diarrhea, no headaches.  In the ED, sodium 138, potassium 3.2, chloride 100, CO2 28, BUN 51, creatinine 2.03, glucose 119, WBC 11.3, hemoglobin 15.2, platelets 287, high sensitive troponin 11.  Lactic acid 3.8, INR 2.2.  In the ED, EDP started cefepime and vancomycin, 2.5 L LR bolus.  Hospitalist service was consulted for further evaluation and management of severe sepsis likely secondary to urinary tract infection.   Assessment & Plan:   Principal Problem:   Severe sepsis with septic shock (HCC) Active Problems:   AF (paroxysmal atrial fibrillation) (HCC)   Benign prostatic hyperplasia with lower urinary tract symptoms   Essential hypertension   Sepsis (HCC)   Iron deficiency anemia   E. coli UTI   Herpes zoster conjunctivitis of left eye   Severe sepsis, POA ESBL E. coli UTI Patient presenting to ED with generalized weakness and was found to have an elevated WBC count of 11.3 in the setting of chronic suprapubic Foley catheter, with lactic acid 3.8, INR 2.2.  Patient was found to be hypotensive, but responsive to IV fluid bolus.  Patient was reportedly confused with  associated acute renal failure at time of admission.  Was started on empiric antibiotics with vancomycin and cefepime.  Urinalysis with large leukocytes, positive nitrite, many bacteria, greater than 50 WBCs.  Urine culture positive for ESBL E. coli.  Blood cultures negative x 5 days.  Blood pressure is now stabilized and midodrine was titrated off.  Completed course of antibiotics with cefepime/ceftriaxone.  Suprapubic catheter exchanged on 03/21/2021. --discontinue midodrine today --Continue monitor BP closely  Severe constipation with abdominal distention KUB notable for dynamic ileus with stool. --Senokot-S1 tablet twice daily --MiraLAX once daily --Bisacodyl 10 mg PR daily --Lactulose 20 g p.o. BID PRN --Strict I's and O's  Paroxysmal atrial fibrillation with RVR --Cardiology following, appreciate assistance --Amiodarone drip transitioned to 400mg  PO BID 12/23 --Metoprolol tartrate 25 mg p.o. 3 times daily --Eliquis 5 mg p.o. twice daily --Continue monitor on telemetry  Lower extremity edema Patient reports worsening lower extremity edema.  D-dimer on admission within normal limits; and repeat remains negative. --Net negative 3.9 L past 24 hours; net positive 2.6L since admission --Furosemide 40 mg IV every 12 hours --Strict I's and O's and daily weights  Acute renal failure: Resolved Creatinine on admission 2.03, improved with IV fluid hydration and treatment of UTI with antibiotics. --2.03>>>0.98 --BMP daily while on aggressive IV diuresis  Depression/anxiety: --Lexapro 5 mg p.o. daily  Herpes zoster ophthalmicus --Valtrex 1000 mg p.o. 3 times daily x 7 days --Prednisolone eyedrops 3 times daily --Outpatient follow-up with ophthalmology, Dr. Wallace Going  BPH: Finasteride 5 mg p.o. daily  Type 2 diabetes mellitus On metformin at home. --Hold oral hypoglycemics while inpatient --SSI for  coverage --CBGs qAC/HS  Hx tremors Patient reports never been diagnosed with  Parkinson's disease. --Resume home carbidopa/levodopa  Neuropathy: Gabapentin  Hyperlipidemia: Atorvastatin 20 mg p.o. daily  Insomnia: --Trazodone 100 mg p.o. nightly as needed  DVT prophylaxis: Place TED hose Start: 03/16/21 1830 apixaban (ELIQUIS) tablet 5 mg    Code Status: DNR Family Communication: No family present at bedside this morning, updated patient's son, Raymond Hays via telephone yesterday afternoon  Disposition Plan:  Level of care: Telemetry Medical Status is: Inpatient  Remains inpatient appropriate because: Remains on IV diuresis, amiodarone drip being transitioned by cardiology today   Consultants:  Discussed with urology, Dr. Erlene Quan 12/21  Procedures:  None  Antimicrobials:  Vancomycin 12/16 -12/16 Metronidazole 12/16 - 12/16 Cefepime 12/16 - 12/18 Ceftriaxone 12/19 12/19   Subjective: Patient seen examined bedside, resting comfortably.  Reports good urine output with IV diuresis yesterday.  Continues with significant lower extremity edema.  Discussed with cardiology, transitioning amiodarone drip to oral today.  No other questions or concerns at this time from the patient.  No family present at bedside.  Denies headache, no dizziness, no chest pain, no abdominal pain, no nausea/vomiting, no fever/chills/night sweats, no vomiting, no paresthesias.  No acute events overnight per nursing staff.  Objective: Vitals:   03/22/21 1927 03/22/21 2329 03/23/21 0410 03/23/21 0757  BP: 97/63 109/60 126/83 105/69  Pulse: 78 86 82   Resp: 17 13 16 13   Temp: 98.4 F (36.9 C) 98.3 F (36.8 C) 99 F (37.2 C) 97.8 F (36.6 C)  TempSrc: Oral Oral    SpO2: 100% 91% 99%   Height:        Intake/Output Summary (Last 24 hours) at 03/23/2021 1246 Last data filed at 03/23/2021 0500 Gross per 24 hour  Intake --  Output 3900 ml  Net -3900 ml   There were no vitals filed for this visit.  Examination:  General exam: Appears calm and comfortable, chronically ill in  appearance Respiratory system: Coarse breath sounds bilaterally, normal respiratory effort, on room air.   Cardiovascular system: S1 & S2 heard, RRR. No JVD, murmurs, rubs, gallops or clicks.  3+ pitting edema bilateral lower extremities right greater than left up to knees Gastrointestinal system: Abdomen mildly distended, soft and nontender. No organomegaly or masses felt. Normal bowel sounds heard.  Suprapubic catheter noted Central nervous system: Alert and oriented. No focal neurological deficits. Extremities: Symmetric 5 x 5 power. Skin: No rashes, lesions or ulcers Psychiatry: Judgement and insight appear poor. Mood & affect appropriate.     Data Reviewed: I have personally reviewed following labs and imaging studies  CBC: Recent Labs  Lab 03/16/21 1551 03/17/21 0608 03/19/21 0557 03/22/21 0653  WBC 11.3* 9.9 9.4 7.3  NEUTROABS 8.0* 6.8 6.6  --   HGB 15.2 13.5 12.8* 12.0*  HCT 46.1 40.8 38.8* 36.1*  MCV 94.1 92.3 94.4 93.5  PLT 287 220 220 0000000   Basic Metabolic Panel: Recent Labs  Lab 03/17/21 0608 03/18/21 0454 03/19/21 0557 03/22/21 0653 03/23/21 0512  NA 144 140 141 135 135  K 3.2* 3.9 3.5 4.2 3.2*  CL 107 111 112* 105 102  CO2 27 21* 22 25 27   GLUCOSE 90 102* 123* 100* 110*  BUN 42* 33* 28* 24* 23  CREATININE 1.44* 1.09 1.04 1.04 0.98  CALCIUM 8.4* 8.5* 8.6* 9.0 8.5*  MG 1.4* 2.3  --  2.0  --   PHOS 2.9  --   --   --   --  GFR: Estimated Creatinine Clearance: 61.1 mL/min (by C-G formula based on SCr of 0.98 mg/dL). Liver Function Tests: Recent Labs  Lab 03/16/21 1551 03/19/21 0557  AST 16 13*  ALT 8 <5  ALKPHOS 78 72  BILITOT 1.0 0.9  PROT 6.9 5.5*  ALBUMIN 3.4* 2.7*   No results for input(s): LIPASE, AMYLASE in the last 168 hours. No results for input(s): AMMONIA in the last 168 hours. Coagulation Profile: Recent Labs  Lab 03/16/21 1551 03/17/21 0608  INR 2.2* 2.1*   Cardiac Enzymes: No results for input(s): CKTOTAL, CKMB, CKMBINDEX,  TROPONINI in the last 168 hours. BNP (last 3 results) No results for input(s): PROBNP in the last 8760 hours. HbA1C: No results for input(s): HGBA1C in the last 72 hours. CBG: Recent Labs  Lab 03/22/21 1251 03/22/21 1545 03/22/21 2058 03/23/21 0757 03/23/21 1225  GLUCAP 107* 116* 133* 155* 173*   Lipid Profile: No results for input(s): CHOL, HDL, LDLCALC, TRIG, CHOLHDL, LDLDIRECT in the last 72 hours. Thyroid Function Tests: No results for input(s): TSH, T4TOTAL, FREET4, T3FREE, THYROIDAB in the last 72 hours. Anemia Panel: No results for input(s): VITAMINB12, FOLATE, FERRITIN, TIBC, IRON, RETICCTPCT in the last 72 hours. Sepsis Labs: Recent Labs  Lab 03/16/21 1554 03/16/21 1838 03/16/21 2253  PROCALCITON  --  <0.10  --   LATICACIDVEN 3.8* 3.9* 3.9*    Recent Results (from the past 240 hour(s))  Culture, blood (routine x 2)     Status: None   Collection Time: 03/16/21  3:51 PM   Specimen: BLOOD  Result Value Ref Range Status   Specimen Description BLOOD RIGHT [facility]  Final   Special Requests   Final    BOTTLES DRAWN AEROBIC AND ANAEROBIC Blood Culture adequate volume   Culture   Final    NO GROWTH 5 DAYS Performed at [facility], [address] [place], [place] [address]    Report Status 03/21/2021 FINAL  Final  Resp Panel by RT-PCR (Flu A&B, Covid) Nasopharyngeal Swab     Status: None   Collection Time: 03/16/21  3:51 PM   Specimen: Nasopharyngeal Swab; Nasopharyngeal(NP) swabs in vial transport medium  Result Value Ref Range Status   SARS Coronavirus 2 by RT PCR NEGATIVE NEGATIVE Final    Comment: (NOTE) SARS-CoV-2 target nucleic acids are NOT DETECTED.  The SARS-CoV-2 RNA is generally detectable in upper respiratory specimens during the acute phase of infection. The lowest concentration of SARS-CoV-2 viral copies this assay can detect is 138 copies/mL. A negative result does not preclude SARS-Cov-2 infection and should not be used as the sole basis  for treatment or other patient management decisions. A negative result may occur with  improper specimen collection/handling, submission of specimen other than nasopharyngeal swab, presence of viral mutation(s) within the areas targeted by this assay, and inadequate number of viral copies(<138 copies/mL). A negative result must be combined with clinical observations, patient history, and epidemiological information. The expected result is Negative.  Fact Sheet for Patients:  EntrepreneurPulse.com.au  Fact Sheet for Healthcare Providers:  IncredibleEmployment.be  This test is no t yet approved or cleared by the [place] FDA and  has been authorized for detection and/or diagnosis of SARS-CoV-2 by FDA under an Emergency Use Authorization (EUA). This EUA will remain  in effect (meaning this test can be used) for the duration of the COVID-19 declaration under Section 564(b)(1) of the Act, 21 U.S.C.section 360bbb-3(b)(1), unless the authorization is terminated  or revoked sooner.  Influenza A by PCR NEGATIVE NEGATIVE Final   Influenza B by PCR NEGATIVE NEGATIVE Final    Comment: (NOTE) The Xpert Xpress SARS-CoV-2/FLU/RSV plus assay is intended as an aid in the diagnosis of influenza from Nasopharyngeal swab specimens and should not be used as a sole basis for treatment. Nasal washings and aspirates are unacceptable for Xpert Xpress SARS-CoV-2/FLU/RSV testing.  Fact Sheet for Patients: BloggerCourse.com  Fact Sheet for Healthcare Providers: SeriousBroker.it  This test is not yet approved or cleared by the [place] FDA and has been authorized for detection and/or diagnosis of SARS-CoV-2 by FDA under an Emergency Use Authorization (EUA). This EUA will remain in effect (meaning this test can be used) for the duration of the COVID-19 declaration under Section 564(b)(1) of the Act, 21  U.S.C. section 360bbb-3(b)(1), unless the authorization is terminated or revoked.  Performed at [facility], [address] [place], [place] [address]   Urine Culture     Status: Abnormal   Collection Time: 03/16/21  6:38 PM   Specimen: Urine, Random  Result Value Ref Range Status   Specimen Description   Final    URINE, RANDOM Performed at [facility], [address], [place], [place] [address]    Special Requests   Final    NONE Performed at [facility], [address] [place], [place] [address]    Culture (A)  Final    80,000 COLONIES/mL ESCHERICHIA COLI Confirmed Extended Spectrum Beta-Lactamase Producer (ESBL).  In bloodstream infections from ESBL organisms, carbapenems are preferred over piperacillin/tazobactam. They are shown to have a lower risk of mortality.    Report Status 03/19/2021 FINAL  Final   Organism ID, Bacteria ESCHERICHIA COLI (A)  Final      Susceptibility   Escherichia coli - MIC*    AMPICILLIN >=32 RESISTANT Resistant     CEFAZOLIN >=64 RESISTANT Resistant     CEFEPIME 16 RESISTANT Resistant     CEFTRIAXONE >=64 RESISTANT Resistant     CIPROFLOXACIN >=4 RESISTANT Resistant     GENTAMICIN <=1 SENSITIVE Sensitive     IMIPENEM <=0.25 SENSITIVE Sensitive     NITROFURANTOIN <=16 SENSITIVE Sensitive     TRIMETH/SULFA >=320 RESISTANT Resistant     AMPICILLIN/SULBACTAM >=32 RESISTANT Resistant     PIP/TAZO <=4 SENSITIVE Sensitive     * 80,000 COLONIES/mL ESCHERICHIA COLI  Culture, blood (routine x 2)     Status: None   Collection Time: 03/16/21 10:53 PM   Specimen: BLOOD  Result Value Ref Range Status   Specimen Description BLOOD BLOOD RIGHT HAND  Final   Special Requests   Final    BOTTLES DRAWN AEROBIC AND ANAEROBIC Blood Culture adequate volume   Culture   Final    NO GROWTH 5 DAYS Performed at [facility], [address] [place], [place] [address]    Report Status 03/21/2021 FINAL  Final          Radiology Studies: [facility] Chest Port 1 View  Result Date: 03/22/2021 CLINICAL DATA:  Shortness of breath EXAM: PORTABLE CHEST 1 VIEW COMPARISON:  03/16/2021 FINDINGS: The heart size and mediastinal contours are within normal limits. Shallow inspiration with low lung volumes. Minimal atelectasis at the right lung base. No pleural effusion or pneumothorax. The visualized skeletal structures are unremarkable. IMPRESSION: Low lung volumes.  Minimal atelectasis at the right lung base. Electronically Signed   By: Guadlupe Spanish M.D.   On: 03/22/2021 11:10        Scheduled Meds:  amiodarone  400 mg Oral BID   apixaban  5 mg Oral BID   atorvastatin  20 mg Oral QHS   carbidopa-levodopa  1 tablet Oral TID AC   escitalopram  5 mg Oral Daily   feeding supplement (GLUCERNA SHAKE)  237 mL Oral BID BM   finasteride  5 mg Oral Daily   fluticasone  1 spray Each Nare Daily   fluticasone furoate-vilanterol  1 puff Inhalation Daily   And   umeclidinium bromide  1 puff Inhalation Daily   furosemide  40 mg Intravenous Q12H   gabapentin  800 mg Oral BID   insulin aspart  0-5 Units Subcutaneous QHS   insulin aspart  0-9 Units Subcutaneous TID WC   ipratropium-albuterol  3 mL Inhalation Daily   metoprolol tartrate  25 mg Oral TID   midodrine  2.5 mg Oral TID   multivitamin with minerals  1 tablet Oral Daily   oxybutynin  10 mg Oral Daily   polyethylene glycol  17 g Oral Daily   prednisoLONE acetate  1 drop Both Eyes QID   Ensure Max Protein  11 oz Oral QHS   senna-docusate  1 tablet Oral BID   Continuous Infusions:  sodium chloride 10 mL/hr at 03/18/21 0310     LOS: 6 days    Time spent: 42 minutes spent on chart review, discussion with nursing staff, consultants, updating family and interview/physical exam; more than 50% of that time was spent in counseling and/or coordination of care.    Tamia Dial J [place] DO [facility] Available via Epic secure chat 7am-7pm After these hours,  please refer to coverage provider listed on [email or web address] 03/23/2021, 12:46 PM

## 2021-03-24 DIAGNOSIS — R6521 Severe sepsis with septic shock: Secondary | ICD-10-CM

## 2021-03-24 DIAGNOSIS — A419 Sepsis, unspecified organism: Secondary | ICD-10-CM

## 2021-03-24 LAB — BASIC METABOLIC PANEL
Anion gap: 7 (ref 5–15)
BUN: 23 mg/dL (ref 8–23)
CO2: 26 mmol/L (ref 22–32)
Calcium: 8.4 mg/dL — ABNORMAL LOW (ref 8.9–10.3)
Chloride: 103 mmol/L (ref 98–111)
Creatinine, Ser: 0.95 mg/dL (ref 0.61–1.24)
GFR, Estimated: >60 mL/min (ref >60)
Glucose, Bld: 128 mg/dL — ABNORMAL HIGH (ref 70–99)
Potassium: 4.1 mmol/L (ref 3.5–5.1)
Sodium: 136 mmol/L (ref 135–145)

## 2021-03-24 LAB — GLUCOSE, CAPILLARY
Glucose-Capillary: 123 mg/dL — ABNORMAL HIGH (ref 70–99)
Glucose-Capillary: 149 mg/dL — ABNORMAL HIGH (ref 70–99)
Glucose-Capillary: 121 mg/dL — ABNORMAL HIGH (ref 70–99)
Glucose-Capillary: 207 mg/dL — ABNORMAL HIGH (ref 70–99)

## 2021-03-24 LAB — MAGNESIUM: Magnesium: 1.6 mg/dL — ABNORMAL LOW (ref 1.7–2.4)

## 2021-03-24 MED ORDER — GUAIFENESIN ER 600 MG PO TB12
1200.0000 mg | ORAL_TABLET | Freq: Two times a day (BID) | ORAL | Status: DC
  Administered 2021-03-24 – 2021-03-26 (×5): 1200 mg via ORAL
  Filled 2021-03-24 (×5): qty 2

## 2021-03-24 MED ORDER — IPRATROPIUM-ALBUTEROL 0.5-2.5 (3) MG/3ML IN SOLN
3.0000 mL | Freq: Four times a day (QID) | RESPIRATORY_TRACT | Status: DC
  Administered 2021-03-24 – 2021-03-25 (×3): 3 mL via RESPIRATORY_TRACT
  Filled 2021-03-24 (×3): qty 3

## 2021-03-24 MED ORDER — ACETYLCYSTEINE 20 % IN SOLN
4.0000 mL | Freq: Two times a day (BID) | RESPIRATORY_TRACT | Status: DC
  Administered 2021-03-24 – 2021-03-26 (×4): 4 mL via RESPIRATORY_TRACT
  Filled 2021-03-24 (×7): qty 4

## 2021-03-24 MED ORDER — HYDROCOD POLST-CPM POLST ER 10-8 MG/5ML PO SUER
5.0000 mL | Freq: Two times a day (BID) | ORAL | Status: DC
  Administered 2021-03-24 – 2021-03-26 (×4): 5 mL via ORAL
  Filled 2021-03-24 (×5): qty 5

## 2021-03-24 NOTE — Progress Notes (Addendum)
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  Patient reports pain in the right side of his temples and difficult for him to open his eyes due to shingles.  Patient reports compliance with his valacyclovir for his shingle treatment.  Patient denies chest pain, no shortness of breath, no abdominal pain, no hematuria, no diarrhea, no headaches.   In the ED, sodium 138, potassium 3.2, chloride 100, CO2 28, BUN 51, creatinine 2.03, glucose 119, WBC 11.3, hemoglobin 15.2, platelets 287, high sensitive troponin 11.  Lactic acid 3.8, INR 2.2.  In the ED, EDP started cefepime and vancomycin, 2.5 L LR bolus.  Hospitalist service was consulted for further evaluation and management of severe sepsis likely secondary to urinary tract infection.   Assessment & Plan:   Principal Problem:   Severe sepsis with septic shock (HCC) Active Problems:   AF (paroxysmal atrial fibrillation) (HCC)   Benign prostatic hyperplasia with lower urinary tract symptoms   Essential hypertension   Sepsis (HCC)   Iron deficiency anemia   E. coli UTI   Herpes zoster conjunctivitis of left eye  Severe sepsis, POA ESBL E. coli UTI Patient presenting to ED with generalized weakness and was found to have an elevated WBC count of 11.3 in the setting of chronic suprapubic Foley catheter, with lactic acid 3.8, INR 2.2.  Patient was found to be hypotensive, but responsive to IV fluid bolus.  Patient was reportedly confused with associated acute renal  failure at time of admission.  Was started on empiric antibiotics with vancomycin and cefepime.  Urinalysis with large leukocytes, positive nitrite, many bacteria, greater than 50 WBCs.  Urine culture positive for ESBL E. coli.  Blood cultures negative x 5 days.  Blood pressure is now stabilized and midodrine was titrated off.  Completed course of antibiotics with cefepime/ceftriaxone.  Suprapubic catheter exchanged on 03/21/2021. Plan: No further antibiotics No midodrine Monitor vitals and fever curve   Severe constipation with abdominal distention KUB notable for dynamic ileus with stool. --Senokot-S1 tablet twice daily --MiraLAX once daily --Bisacodyl 10 mg PR daily --Lactulose 20 g p.o. BID PRN --Strict I's and O's  Paroxysmal atrial fibrillation with RVR --Cardiology following, appreciate assistance --Amiodarone drip transitioned to 400mg  PO BID 12/23 --Metoprolol tartrate 25 mg p.o. 3 times daily --Eliquis 5 mg p.o. twice daily --Continue monitor on telemetry, appears to be back in sinus rhythm   Lower extremity edema Patient reports worsening lower extremity edema.  D-dimer on admission within normal limits; and repeat remains negative. -- Net -1.5 L yesterday, net +1.1 L since admission -- Continue Lasix 40 IV every 12 --Strict I's and O's and daily weights   Acute renal failure: Resolved Creatinine on admission 2.03, improved with IV fluid hydration and treatment of UTI with antibiotics. --2.03>>>0.98 --BMP daily while on aggressive IV diuresis   Depression/anxiety: --Lexapro 5 mg p.o. daily   Herpes zoster ophthalmicus --Valtrex 1000 mg p.o. 3 times daily x 7 days --Prednisolone eyedrops 3 times daily --Outpatient follow-up with ophthalmology, Dr. Wallace Going   BPH: Finasteride 5 mg p.o. daily   Type 2 diabetes  mellitus On metformin at home. --Hold oral hypoglycemics while inpatient --SSI for coverage --CBGs qAC/HS   Hx tremors Patient reports never been  diagnosed with Parkinson's disease. --Resume home carbidopa/levodopa   Neuropathy: Gabapentin   Hyperlipidemia: Atorvastatin 20 mg p.o. daily   Insomnia: --Trazodone 100 mg p.o. nightly as needed   DVT prophylaxis: Eliquis Code Status: DNR Family Communication: son Annie Main [phone] on 12/24 Disposition Plan: Status is: Inpatient  Remains inpatient appropriate because: Volume status improving.  Significant cough.  Attempting to optimize volume status and symptom control prior to disposition.       Level of care: Telemetry Medical  Consultants:  Cardiology  Procedures:  None  Antimicrobials: None   Subjective: Seen and examined.  Still with significant cough.  Objective: Vitals:   03/24/21 0500 03/24/21 0821 03/24/21 1211 03/24/21 1211  BP:  (!) 143/81 111/70 111/70  Pulse:  64 87 91  Resp:  (!) 22 18 18   Temp:  97.9 F (36.6 C) 98.9 F (37.2 C) 98.9 F (37.2 C)  TempSrc:      SpO2:  90% 98% 96%  Weight: 99.7 kg     Height:        Intake/Output Summary (Last 24 hours) at 03/24/2021 1354 Last data filed at 03/24/2021 0505 Gross per 24 hour  Intake 1033.4 ml  Output 2500 ml  Net -1466.6 ml   Filed Weights   03/24/21 0500  Weight: 99.7 kg    Examination:  General exam: No acute distress.  Chronically ill Respiratory system: Coarse breath sounds bilaterally.  Normal work of breathing.  Room air Cardiovascular system: S1-S2, RRR, no murmurs, 3+ pitting edema Gastrointestinal system: Soft, mild distention, nontender, normal bowel sounds, suprapubic catheter in place Central nervous system: Alert and oriented. No focal neurological deficits. Extremities: Symmetric 5 x 5 power. Skin: No rashes, lesions or ulcers Psychiatry: Judgement and insight appear impaired. Mood & affect appropriate.     Data Reviewed: I have personally reviewed following labs and imaging studies  CBC: Recent Labs  Lab 03/19/21 0557 03/22/21 0653  WBC 9.4 7.3   NEUTROABS 6.6  --   HGB 12.8* 12.0*  HCT 38.8* 36.1*  MCV 94.4 93.5  PLT 220 0000000   Basic Metabolic Panel: Recent Labs  Lab 03/18/21 0454 03/19/21 0557 03/22/21 0653 03/23/21 0512 03/24/21 0617  NA 140 141 135 135 136  K 3.9 3.5 4.2 3.2* 4.1  CL 111 112* 105 102 103  CO2 21* 22 25 27 26   GLUCOSE 102* 123* 100* 110* 128*  BUN 33* 28* 24* 23 23  CREATININE 1.09 1.04 1.04 0.98 0.95  CALCIUM 8.5* 8.6* 9.0 8.5* 8.4*  MG 2.3  --  2.0  --  1.6*   GFR: Estimated Creatinine Clearance: 74.5 mL/min (by C-G formula based on SCr of 0.95 mg/dL). Liver Function Tests: Recent Labs  Lab 03/19/21 0557  AST 13*  ALT <5  ALKPHOS 72  BILITOT 0.9  PROT 5.5*  ALBUMIN 2.7*   No results for input(s): LIPASE, AMYLASE in the last 168 hours. No results for input(s): AMMONIA in the last 168 hours. Coagulation Profile: No results for input(s): INR, PROTIME in the last 168 hours. Cardiac Enzymes: No results for input(s): CKTOTAL, CKMB, CKMBINDEX, TROPONINI in the last 168 hours. BNP (last 3 results) No results for input(s): PROBNP in the last 8760 hours. HbA1C: No results for input(s): HGBA1C in the last 72 hours. CBG: Recent Labs  Lab 03/23/21 1225 03/23/21 1649 03/23/21 2104 03/24/21 0823 03/24/21  Rayne   Lipid Profile: No results for input(s): CHOL, HDL, LDLCALC, TRIG, CHOLHDL, LDLDIRECT in the last 72 hours. Thyroid Function Tests: No results for input(s): TSH, T4TOTAL, FREET4, T3FREE, THYROIDAB in the last 72 hours. Anemia Panel: No results for input(s): VITAMINB12, FOLATE, FERRITIN, TIBC, IRON, RETICCTPCT in the last 72 hours. Sepsis Labs: No results for input(s): PROCALCITON, LATICACIDVEN in the last 168 hours.  Recent Results (from the past 240 hour(s))  Culture, blood (routine x 2)     Status: None   Collection Time: 03/16/21  3:51 PM   Specimen: BLOOD  Result Value Ref Range Status   Specimen Description BLOOD RIGHT [facility]  Final    Special Requests   Final    BOTTLES DRAWN AEROBIC AND ANAEROBIC Blood Culture adequate volume   Culture   Final    NO GROWTH 5 DAYS Performed at [facility], [address] [place], [place] [address]    Report Status 03/21/2021 FINAL  Final  Resp Panel by RT-PCR (Flu A&B, Covid) Nasopharyngeal Swab     Status: None   Collection Time: 03/16/21  3:51 PM   Specimen: Nasopharyngeal Swab; Nasopharyngeal(NP) swabs in vial transport medium  Result Value Ref Range Status   SARS Coronavirus 2 by RT PCR NEGATIVE NEGATIVE Final    Comment: (NOTE) SARS-CoV-2 target nucleic acids are NOT DETECTED.  The SARS-CoV-2 RNA is generally detectable in upper respiratory specimens during the acute phase of infection. The lowest concentration of SARS-CoV-2 viral copies this assay can detect is 138 copies/mL. A negative result does not preclude SARS-Cov-2 infection and should not be used as the sole basis for treatment or other patient management decisions. A negative result may occur with  improper specimen collection/handling, submission of specimen other than nasopharyngeal swab, presence of viral mutation(s) within the areas targeted by this assay, and inadequate number of viral copies(<138 copies/mL). A negative result must be combined with clinical observations, patient history, and epidemiological information. The expected result is Negative.  Fact Sheet for Patients:  EntrepreneurPulse.com.au  Fact Sheet for Healthcare Providers:  IncredibleEmployment.be  This test is no t yet approved or cleared by the [place] FDA and  has been authorized for detection and/or diagnosis of SARS-CoV-2 by FDA under an Emergency Use Authorization (EUA). This EUA will remain  in effect (meaning this test can be used) for the duration of the COVID-19 declaration under Section 564(b)(1) of the Act, 21 U.S.C.section 360bbb-3(b)(1), unless the authorization is  terminated  or revoked sooner.       Influenza A by PCR NEGATIVE NEGATIVE Final   Influenza B by PCR NEGATIVE NEGATIVE Final    Comment: (NOTE) The Xpert Xpress SARS-CoV-2/FLU/RSV plus assay is intended as an aid in the diagnosis of influenza from Nasopharyngeal swab specimens and should not be used as a sole basis for treatment. Nasal washings and aspirates are unacceptable for Xpert Xpress SARS-CoV-2/FLU/RSV testing.  Fact Sheet for Patients: EntrepreneurPulse.com.au  Fact Sheet for Healthcare Providers: IncredibleEmployment.be  This test is not yet approved or cleared by the [place] FDA and has been authorized for detection and/or diagnosis of SARS-CoV-2 by FDA under an Emergency Use Authorization (EUA). This EUA will remain in effect (meaning this test can be used) for the duration of the COVID-19 declaration under Section 564(b)(1) of the Act, 21 U.S.C. section 360bbb-3(b)(1), unless the authorization is terminated or revoked.  Performed at [facility], [address] [place], [place] [address]  Urine Culture     Status: Abnormal   Collection Time: 03/16/21  6:38 PM   Specimen: Urine, Random  Result Value Ref Range Status   Specimen Description   Final    URINE, RANDOM Performed at [facility], [address] [place], [place] [address]    Special Requests   Final    NONE Performed at [facility], [address] [place], [place] [address]    Culture (A)  Final    80,000 COLONIES/mL ESCHERICHIA COLI Confirmed Extended Spectrum Beta-Lactamase Producer (ESBL).  In bloodstream infections from ESBL organisms, carbapenems are preferred over piperacillin/tazobactam. They are shown to have a lower risk of mortality.    Report Status 03/19/2021 FINAL  Final   Organism ID, Bacteria ESCHERICHIA COLI (A)  Final      Susceptibility   Escherichia coli - MIC*    AMPICILLIN >=32 RESISTANT Resistant      CEFAZOLIN >=64 RESISTANT Resistant     CEFEPIME 16 RESISTANT Resistant     CEFTRIAXONE >=64 RESISTANT Resistant     CIPROFLOXACIN >=4 RESISTANT Resistant     GENTAMICIN <=1 SENSITIVE Sensitive     IMIPENEM <=0.25 SENSITIVE Sensitive     NITROFURANTOIN <=16 SENSITIVE Sensitive     TRIMETH/SULFA >=320 RESISTANT Resistant     AMPICILLIN/SULBACTAM >=32 RESISTANT Resistant     PIP/TAZO <=4 SENSITIVE Sensitive     * 80,000 COLONIES/mL ESCHERICHIA COLI  Culture, blood (routine x 2)     Status: None   Collection Time: 03/16/21 10:53 PM   Specimen: BLOOD  Result Value Ref Range Status   Specimen Description BLOOD BLOOD RIGHT HAND  Final   Special Requests   Final    BOTTLES DRAWN AEROBIC AND ANAEROBIC Blood Culture adequate volume   Culture   Final    NO GROWTH 5 DAYS Performed at [facility], [address], [place], [place] [address]    Report Status 03/21/2021 FINAL  Final         Radiology Studies: No results found.      Scheduled Meds:  acetylcysteine  4 mL Nebulization BID   amiodarone  400 mg Oral BID   apixaban  5 mg Oral BID   atorvastatin  20 mg Oral QHS   carbidopa-levodopa  1 tablet Oral TID AC   chlorpheniramine-HYDROcodone  5 mL Oral Q12H   escitalopram  5 mg Oral Daily   feeding supplement (GLUCERNA SHAKE)  237 mL Oral BID BM   finasteride  5 mg Oral Daily   fluticasone  1 spray Each Nare Daily   fluticasone furoate-vilanterol  1 puff Inhalation Daily   And   umeclidinium bromide  1 puff Inhalation Daily   furosemide  40 mg Intravenous Q12H   gabapentin  800 mg Oral BID   guaiFENesin  1,200 mg Oral BID   insulin aspart  0-5 Units Subcutaneous QHS   insulin aspart  0-9 Units Subcutaneous TID WC   ipratropium-albuterol  3 mL Inhalation Q6H   metoprolol tartrate  25 mg Oral TID   multivitamin with minerals  1 tablet Oral Daily   oxybutynin  10 mg Oral Daily   polyethylene glycol  17 g Oral Daily   prednisoLONE acetate  1 drop Both Eyes QID    Ensure Max Protein  11 oz Oral QHS   senna-docusate  1 tablet Oral BID   Continuous Infusions:  sodium chloride 10 mL/hr at 03/18/21 0310     LOS: 7 days  Time spent: 35 minutes    Tresa Moore, MD [facility]   If 7PM-7AM, please contact night-coverage  03/24/2021, 1:54 PM

## 2021-03-24 NOTE — Progress Notes (Signed)
SUBJECTIVE: No chest pain or shortness of breath.   Vitals:   03/23/21 2051 03/24/21 0000 03/24/21 0400 03/24/21 0500  BP: 103/67 101/80 103/79   Pulse:  76 86   Resp: 18 17 16    Temp: 97.8 F (36.6 C) 98.6 F (37 C) 98.3 F (36.8 C)   TempSrc:  Oral Oral   SpO2: 97% 97% 97%   Weight:    99.7 kg  Height:        Intake/Output Summary (Last 24 hours) at 03/24/2021 0819 Last data filed at 03/24/2021 0505 Gross per 24 hour  Intake 1033.4 ml  Output 2500 ml  Net -1466.6 ml    LABS: Basic Metabolic Panel: Recent Labs    03/22/21 0653 03/23/21 0512 03/24/21 0617  NA 135 135 136  K 4.2 3.2* 4.1  CL 105 102 103  CO2 25 27 26   GLUCOSE 100* 110* 128*  BUN 24* 23 23  CREATININE 1.04 0.98 0.95  CALCIUM 9.0 8.5* 8.4*  MG 2.0  --  1.6*   Liver Function Tests: No results for input(s): AST, ALT, ALKPHOS, BILITOT, PROT, ALBUMIN in the last 72 hours. No results for input(s): LIPASE, AMYLASE in the last 72 hours. CBC: Recent Labs    03/22/21 0653  WBC 7.3  HGB 12.0*  HCT 36.1*  MCV 93.5  PLT 162   Cardiac Enzymes: No results for input(s): CKTOTAL, CKMB, CKMBINDEX, TROPONINI in the last 72 hours. BNP: Invalid input(s): POCBNP D-Dimer: Recent Labs    03/22/21 1146  DDIMER <0.27   Hemoglobin A1C: No results for input(s): HGBA1C in the last 72 hours. Fasting Lipid Panel: No results for input(s): CHOL, HDL, LDLCALC, TRIG, CHOLHDL, LDLDIRECT in the last 72 hours. Thyroid Function Tests: No results for input(s): TSH, T4TOTAL, T3FREE, THYROIDAB in the last 72 hours.  Invalid input(s): FREET3 Anemia Panel: No results for input(s): VITAMINB12, FOLATE, FERRITIN, TIBC, IRON, RETICCTPCT in the last 72 hours.   PHYSICAL EXAM General: Well developed, well nourished, in no acute distress HEENT:  Normocephalic and atramatic Neck:  No JVD.  Lungs: Clear bilaterally to auscultation and percussion. Heart: HRRR . Normal S1 and S2 without gallops or murmurs.  Abdomen: Bowel  sounds are positive, abdomen soft and non-tender  Msk:  Back normal, normal gait. Normal strength and tone for age. Extremities: No clubbing, cyanosis or edema.   Neuro: Alert and oriented X 3. Psych:  Good affect, responds appropriately  TELEMETRY: Sinus rhythm  ASSESSMENT AND PLAN: Patient right now in sinus rhythm appears to be doing fine.  Principal Problem:   Severe sepsis with septic shock (HCC) Active Problems:   AF (paroxysmal atrial fibrillation) (HCC)   Benign prostatic hyperplasia with lower urinary tract symptoms   Essential hypertension   Sepsis (HCC)   Iron deficiency anemia   E. coli UTI   Herpes zoster conjunctivitis of left eye    Raymond Hays A, MD, [facility] 03/24/2021 8:19 AM

## 2021-03-25 DIAGNOSIS — R6521 Severe sepsis with septic shock: Secondary | ICD-10-CM | POA: Diagnosis not present

## 2021-03-25 DIAGNOSIS — A419 Sepsis, unspecified organism: Secondary | ICD-10-CM | POA: Diagnosis not present

## 2021-03-25 LAB — GLUCOSE, CAPILLARY
Glucose-Capillary: 128 mg/dL — ABNORMAL HIGH (ref 70–99)
Glucose-Capillary: 127 mg/dL — ABNORMAL HIGH (ref 70–99)
Glucose-Capillary: 115 mg/dL — ABNORMAL HIGH (ref 70–99)
Glucose-Capillary: 167 mg/dL — ABNORMAL HIGH (ref 70–99)

## 2021-03-25 MED ORDER — IPRATROPIUM-ALBUTEROL 0.5-2.5 (3) MG/3ML IN SOLN: 3.0000 mL | Freq: Four times a day (QID) | RESPIRATORY_TRACT | Status: DC | PRN

## 2021-03-25 MED ORDER — IPRATROPIUM-ALBUTEROL 0.5-2.5 (3) MG/3ML IN SOLN
3.0000 mL | Freq: Two times a day (BID) | RESPIRATORY_TRACT | Status: DC
  Administered 2021-03-25 – 2021-03-26 (×2): 3 mL via RESPIRATORY_TRACT
  Filled 2021-03-25 (×2): qty 3

## 2021-03-25 MED ORDER — FLEET ENEMA 7-19 GM/118ML RE ENEM
1.0000 | ENEMA | Freq: Once | RECTAL | Status: AC
  Administered 2021-03-25: 13:00:00 1 via RECTAL

## 2021-03-25 NOTE — Progress Notes (Signed)
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  Patient reports pain in the right side of his temples and difficult for him to open his eyes due to shingles.  Patient reports compliance with his valacyclovir for his shingle treatment.  Patient denies chest pain, no shortness of breath, no abdominal pain, no hematuria, no diarrhea, no headaches.   In the ED, sodium 138, potassium 3.2, chloride 100, CO2 28, BUN 51, creatinine 2.03, glucose 119, WBC 11.3, hemoglobin 15.2, platelets 287, high sensitive troponin 11.  Lactic acid 3.8, INR 2.2.  In the ED, EDP started cefepime and vancomycin, 2.5 L LR bolus.  Hospitalist service was consulted for further evaluation and management of severe sepsis likely secondary to urinary tract infection.   Assessment & Plan:   Principal Problem:   Severe sepsis with septic shock (HCC) Active Problems:   AF (paroxysmal atrial fibrillation) (HCC)   Benign prostatic hyperplasia with lower urinary tract symptoms   Essential hypertension   Sepsis (HCC)   Iron deficiency anemia   E. coli UTI   Herpes zoster conjunctivitis of left eye  Severe sepsis, POA ESBL E. coli UTI Patient presenting to ED with generalized weakness and was found to have an elevated WBC count of 11.3 in the setting of chronic suprapubic Foley catheter, with lactic acid 3.8, INR 2.2.  Patient was found to be hypotensive, but responsive to IV fluid bolus.  Patient was reportedly confused with associated acute renal  failure at time of admission.  Was started on empiric antibiotics with vancomycin and cefepime.  Urinalysis with large leukocytes, positive nitrite, many bacteria, greater than 50 WBCs.  Urine culture positive for ESBL E. coli.  Blood cultures negative x 5 days.  Blood pressure is now stabilized and midodrine was titrated off.  Completed course of antibiotics with cefepime/ceftriaxone.  Suprapubic catheter exchanged on 03/21/2021. Plan: No further antibiotics No midodrine Monitor vitals and fever curve   Severe constipation with abdominal distention KUB notable for dynamic ileus with stool. LBM reported as of 12/25 Plan: Fleet enema x1 --Senokot-S1 tablet twice daily --MiraLAX once daily --Bisacodyl 10 mg PR daily --Lactulose 20 g p.o. BID PRN --Strict I's and O's  Paroxysmal atrial fibrillation with RVR --Cardiology following, appreciate assistance --Amiodarone drip transitioned to 400mg  PO BID 12/23 --Metoprolol tartrate 25 mg p.o. 3 times daily --Eliquis 5 mg p.o. twice daily --Continue monitor on telemetry, appears to be back in sinus rhythm   Lower extremity edema Patient reports worsening lower extremity edema.  D-dimer on admission within normal limits; and repeat remains negative. -- Near net 0 for admission -- Continue Lasix 40 IV every 12 --Consider transition to p.o. diuretics within 24 hours --Strict I's and O's and daily weights   Acute renal failure: Resolved Creatinine on admission 2.03, improved with IV fluid hydration and treatment of UTI with antibiotics. --2.03>>>0.98 --BMP daily while on aggressive IV diuresis   Depression/anxiety: --Lexapro 5 mg p.o. daily   Herpes zoster ophthalmicus --Valtrex 1000 mg p.o. 3 times daily x 7 days --Prednisolone eyedrops 3 times daily --Outpatient follow-up with ophthalmology, Dr. Wallace Going  BPH: Finasteride 5 mg p.o. daily   Type 2 diabetes mellitus On metformin at home. --Hold oral hypoglycemics while  inpatient --SSI for coverage --CBGs qAC/HS   Hx tremors Patient reports never been diagnosed with Parkinson's disease. --Resume home carbidopa/levodopa   Neuropathy: Gabapentin   Hyperlipidemia: Atorvastatin 20 mg p.o. daily   Insomnia: --Trazodone 100 mg p.o. nightly as needed   DVT prophylaxis: Eliquis Code Status: DNR Family Communication: son Annie Main [phone] on 12/24 Disposition Plan: Status is: Inpatient  Remains inpatient appropriate because: Volume status improving.  Cough improving.  Possible medical readiness for discharge 24 to 48 hours      Level of care: Telemetry Medical  Consultants:  Cardiology  Procedures:  None  Antimicrobials: None   Subjective: Seen and examined.  Cough appears improved.  Patient continues to endorse constipation  Objective: Vitals:   03/24/21 1948 03/24/21 2007 03/24/21 2340 03/25/21 0428  BP:  114/77 107/81 99/70  Pulse:   (!) 103 99  Resp:  16 17 19   Temp:  98.6 F (37 C) 98.3 F (36.8 C) 98.7 F (37.1 C)  TempSrc:  Oral Oral Oral  SpO2: 94% 99% 100% 100%  Weight:    100.9 kg  Height:        Intake/Output Summary (Last 24 hours) at 03/25/2021 1143 Last data filed at 03/24/2021 1634 Gross per 24 hour  Intake --  Output 800 ml  Net -800 ml   Filed Weights   03/24/21 0500 03/25/21 0428  Weight: 99.7 kg 100.9 kg    Examination:  General exam: No apparent distress.  Appears chronically ill Respiratory system: Coarse breath sounds bilaterally.  No work breathing.  Room air Cardiovascular system: S1-S2, RRR, no murmurs, 3+ pitting edema Gastrointestinal system: Soft.  Mild distention.  Mild TTP.  Normal bowel sounds.  Suprapubic catheter in place Central nervous system: Alert and oriented. No focal neurological deficits. Extremities: Symmetric 5 x 5 power. Skin: No rashes, lesions or ulcers Psychiatry: Judgement and insight appear impaired. Mood & affect appropriate.     Data Reviewed: I have  personally reviewed following labs and imaging studies  CBC: Recent Labs  Lab 03/19/21 0557 03/22/21 0653  WBC 9.4 7.3  NEUTROABS 6.6  --   HGB 12.8* 12.0*  HCT 38.8* 36.1*  MCV 94.4 93.5  PLT 220 0000000   Basic Metabolic Panel: Recent Labs  Lab 03/19/21 0557 03/22/21 0653 03/23/21 0512 03/24/21 0617  NA 141 135 135 136  K 3.5 4.2 3.2* 4.1  CL 112* 105 102 103  CO2 22 25 27 26   GLUCOSE 123* 100* 110* 128*  BUN 28* 24* 23 23  CREATININE 1.04 1.04 0.98 0.95  CALCIUM 8.6* 9.0 8.5* 8.4*  MG  --  2.0  --  1.6*   GFR: Estimated Creatinine Clearance: 75 mL/min (by C-G formula based on SCr of 0.95 mg/dL). Liver Function Tests: Recent Labs  Lab 03/19/21 0557  AST 13*  ALT <5  ALKPHOS 72  BILITOT 0.9  PROT 5.5*  ALBUMIN 2.7*   No results for input(s): LIPASE, AMYLASE in the last 168 hours. No results for input(s): AMMONIA in the last 168 hours. Coagulation Profile: No results for input(s): INR, PROTIME in the last 168 hours. Cardiac Enzymes: No results for input(s): CKTOTAL, CKMB, CKMBINDEX, TROPONINI in the last 168 hours. BNP (last 3 results) No results for input(s): PROBNP in the last 8760 hours. HbA1C: No results for input(s): HGBA1C in the last 72 hours. CBG: Recent Labs  Lab  03/24/21 0823 03/24/21 1212 03/24/21 1654 03/24/21 1956 03/25/21 0856  GLUCAP 123* 149* 121* 207* 128*   Lipid Profile: No results for input(s): CHOL, HDL, LDLCALC, TRIG, CHOLHDL, LDLDIRECT in the last 72 hours. Thyroid Function Tests: No results for input(s): TSH, T4TOTAL, FREET4, T3FREE, THYROIDAB in the last 72 hours. Anemia Panel: No results for input(s): VITAMINB12, FOLATE, FERRITIN, TIBC, IRON, RETICCTPCT in the last 72 hours. Sepsis Labs: No results for input(s): PROCALCITON, LATICACIDVEN in the last 168 hours.  Recent Results (from the past 240 hour(s))  Culture, blood (routine x 2)     Status: None   Collection Time: 03/16/21  3:51 PM   Specimen: BLOOD  Result Value  Ref Range Status   Specimen Description BLOOD RIGHT [facility]  Final   Special Requests   Final    BOTTLES DRAWN AEROBIC AND ANAEROBIC Blood Culture adequate volume   Culture   Final    NO GROWTH 5 DAYS Performed at [facility], [address] [place], [place] [address]    Report Status 03/21/2021 FINAL  Final  Resp Panel by RT-PCR (Flu A&B, Covid) Nasopharyngeal Swab     Status: None   Collection Time: 03/16/21  3:51 PM   Specimen: Nasopharyngeal Swab; Nasopharyngeal(NP) swabs in vial transport medium  Result Value Ref Range Status   SARS Coronavirus 2 by RT PCR NEGATIVE NEGATIVE Final    Comment: (NOTE) SARS-CoV-2 target nucleic acids are NOT DETECTED.  The SARS-CoV-2 RNA is generally detectable in upper respiratory specimens during the acute phase of infection. The lowest concentration of SARS-CoV-2 viral copies this assay can detect is 138 copies/mL. A negative result does not preclude SARS-Cov-2 infection and should not be used as the sole basis for treatment or other patient management decisions. A negative result may occur with  improper specimen collection/handling, submission of specimen other than nasopharyngeal swab, presence of viral mutation(s) within the areas targeted by this assay, and inadequate number of viral copies(<138 copies/mL). A negative result must be combined with clinical observations, patient history, and epidemiological information. The expected result is Negative.  Fact Sheet for Patients:  EntrepreneurPulse.com.au  Fact Sheet for Healthcare Providers:  IncredibleEmployment.be  This test is no t yet approved or cleared by the [place] FDA and  has been authorized for detection and/or diagnosis of SARS-CoV-2 by FDA under an Emergency Use Authorization (EUA). This EUA will remain  in effect (meaning this test can be used) for the duration of the COVID-19 declaration under Section 564(b)(1) of the Act,  21 U.S.C.section 360bbb-3(b)(1), unless the authorization is terminated  or revoked sooner.       Influenza A by PCR NEGATIVE NEGATIVE Final   Influenza B by PCR NEGATIVE NEGATIVE Final    Comment: (NOTE) The Xpert Xpress SARS-CoV-2/FLU/RSV plus assay is intended as an aid in the diagnosis of influenza from Nasopharyngeal swab specimens and should not be used as a sole basis for treatment. Nasal washings and aspirates are unacceptable for Xpert Xpress SARS-CoV-2/FLU/RSV testing.  Fact Sheet for Patients: EntrepreneurPulse.com.au  Fact Sheet for Healthcare Providers: IncredibleEmployment.be  This test is not yet approved or cleared by the [place] FDA and has been authorized for detection and/or diagnosis of SARS-CoV-2 by FDA under an Emergency Use Authorization (EUA). This EUA will remain in effect (meaning this test can be used) for the duration of the COVID-19 declaration under Section 564(b)(1) of the Act, 21 U.S.C. section 360bbb-3(b)(1), unless the authorization is terminated or revoked.  Performed at [facility], [address] [place], [place] [address]   Urine Culture     Status: Abnormal   Collection Time: 03/16/21  6:38 PM   Specimen: Urine, Random  Result Value Ref Range Status   Specimen Description   Final    URINE, RANDOM Performed at [facility], [address] [place], [place] [address]    Special Requests   Final    NONE Performed at [facility], [address] [place], [place] [address]    Culture (A)  Final    80,000 COLONIES/mL ESCHERICHIA COLI Confirmed Extended Spectrum Beta-Lactamase Producer (ESBL).  In bloodstream infections from ESBL organisms, carbapenems are preferred over piperacillin/tazobactam. They are shown to have a lower risk of mortality.    Report Status 03/19/2021 FINAL  Final   Organism ID, Bacteria ESCHERICHIA COLI (A)  Final      Susceptibility    Escherichia coli - MIC*    AMPICILLIN >=32 RESISTANT Resistant     CEFAZOLIN >=64 RESISTANT Resistant     CEFEPIME 16 RESISTANT Resistant     CEFTRIAXONE >=64 RESISTANT Resistant     CIPROFLOXACIN >=4 RESISTANT Resistant     GENTAMICIN <=1 SENSITIVE Sensitive     IMIPENEM <=0.25 SENSITIVE Sensitive     NITROFURANTOIN <=16 SENSITIVE Sensitive     TRIMETH/SULFA >=320 RESISTANT Resistant     AMPICILLIN/SULBACTAM >=32 RESISTANT Resistant     PIP/TAZO <=4 SENSITIVE Sensitive     * 80,000 COLONIES/mL ESCHERICHIA COLI  Culture, blood (routine x 2)     Status: None   Collection Time: 03/16/21 10:53 PM   Specimen: BLOOD  Result Value Ref Range Status   Specimen Description BLOOD BLOOD RIGHT HAND  Final   Special Requests   Final    BOTTLES DRAWN AEROBIC AND ANAEROBIC Blood Culture adequate volume   Culture   Final    NO GROWTH 5 DAYS Performed at [facility], [address] [place], [place] [address]    Report Status 03/21/2021 FINAL  Final         Radiology Studies: No results found.      Scheduled Meds:  acetylcysteine  4 mL Nebulization BID   amiodarone  400 mg Oral BID   apixaban  5 mg Oral BID   atorvastatin  20 mg Oral QHS   carbidopa-levodopa  1 tablet Oral TID AC   chlorpheniramine-HYDROcodone  5 mL Oral Q12H   escitalopram  5 mg Oral Daily   feeding supplement (GLUCERNA SHAKE)  237 mL Oral BID BM   finasteride  5 mg Oral Daily   fluticasone  1 spray Each Nare Daily   fluticasone furoate-vilanterol  1 puff Inhalation Daily   And   umeclidinium bromide  1 puff Inhalation Daily   furosemide  40 mg Intravenous Q12H   gabapentin  800 mg Oral BID   guaiFENesin  1,200 mg Oral BID   insulin aspart  0-5 Units Subcutaneous QHS   insulin aspart  0-9 Units Subcutaneous TID WC   ipratropium-albuterol  3 mL Inhalation BID   metoprolol tartrate  25 mg Oral TID   multivitamin with minerals  1 tablet Oral Daily   oxybutynin  10 mg Oral Daily   polyethylene  glycol  17 g Oral Daily   prednisoLONE acetate  1 drop Both Eyes QID   Ensure Max Protein  11 oz Oral QHS   senna-docusate  1 tablet Oral BID   sodium phosphate  1 enema Rectal Once  Continuous Infusions:  sodium chloride 10 mL/hr at 03/18/21 0310     LOS: 8 days    Time spent: 25 minutes    Tresa Moore, MD [facility]   If 7PM-7AM, please contact night-coverage  03/25/2021, 11:43 AM

## 2021-03-26 DIAGNOSIS — A419 Sepsis, unspecified organism: Secondary | ICD-10-CM | POA: Diagnosis not present

## 2021-03-26 DIAGNOSIS — R6521 Severe sepsis with septic shock: Secondary | ICD-10-CM | POA: Diagnosis not present

## 2021-03-26 LAB — GLUCOSE, CAPILLARY
Glucose-Capillary: 121 mg/dL — ABNORMAL HIGH (ref 70–99)
Glucose-Capillary: 146 mg/dL — ABNORMAL HIGH (ref 70–99)
Glucose-Capillary: 157 mg/dL — ABNORMAL HIGH (ref 70–99)

## 2021-03-26 LAB — RESP PANEL BY RT-PCR (FLU A&B, COVID) ARPGX2
Influenza A by PCR: POSITIVE — AB
Influenza B by PCR: NEGATIVE
SARS Coronavirus 2 by RT PCR: NEGATIVE

## 2021-03-26 MED ORDER — OSELTAMIVIR PHOSPHATE 75 MG PO CAPS
75.0000 mg | ORAL_CAPSULE | Freq: Two times a day (BID) | ORAL | Status: DC
  Administered 2021-03-26: 14:00:00 75 mg via ORAL
  Filled 2021-03-26 (×2): qty 1

## 2021-03-26 MED ORDER — PREDNISOLONE ACETATE 1 % OP SUSP
1.0000 [drp] | Freq: Four times a day (QID) | OPHTHALMIC | 0 refills | Status: DC
Start: 2021-03-26 — End: 2022-07-24

## 2021-03-26 MED ORDER — AMIODARONE HCL 400 MG PO TABS: ORAL_TABLET | ORAL | 0 refills | Status: DC

## 2021-03-26 MED ORDER — OSELTAMIVIR PHOSPHATE 75 MG PO CAPS: 75.0000 mg | ORAL_CAPSULE | Freq: Two times a day (BID) | ORAL | Status: AC

## 2021-03-26 MED ORDER — METOPROLOL TARTRATE 25 MG PO TABS: 25.0000 mg | ORAL_TABLET | Freq: Three times a day (TID) | ORAL | Status: DC

## 2021-03-26 NOTE — Discharge Summary (Addendum)
Physician Discharge Summary  Raymond Hays [id number] DOB: 12-May-1945 DOA: 03/16/2021  PCP: [facility]  Admit date: 03/16/2021 Discharge date: 03/26/2021  Admitted From: SNF Disposition: SNF, [facility]  Recommendations for Outpatient Follow-up:  Follow up with PCP in 1-2 weeks Follow-up in 1 week with ophthalmology  Home Health: No Equipment/Devices: No  Discharge Condition: Stable CODE STATUS: DNR Diet recommendation: Heart healthy/carb modified  Brief/Interim Summary:  75 year old male with past medical history significant for COPD, non-insulin-dependent diabetes mellitus, essential hypertension, hyperlipidemia, paroxysmal atrial fibrillation on Eliquis, osteoarthritis, Hx spinal stenosis, 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  Patient reports pain in the right side of his temples and difficult for him to open his eyes due to shingles.  Patient reports compliance with his valacyclovir for his shingle treatment.  Patient denies chest pain, no shortness of breath, no abdominal pain, no hematuria, no diarrhea, no headaches.   In the ED, sodium 138, potassium 3.2, chloride 100, CO2 28, BUN 51, creatinine 2.03, glucose 119, WBC 11.3, hemoglobin 15.2, platelets 287, high sensitive troponin 11.  Lactic acid 3.8, INR 2.2.  In the ED, EDP started cefepime and vancomycin, 2.5 L LR bolus.  Hospitalist service was consulted for further evaluation and management of severe sepsis likely secondary to urinary tract infection.  Admitted treated for severe sepsis secondary to ESBL E. coli UTI.  Completed course of broad-spectrum antibiotics.  Suprapubic catheter exchanged on 12/21.  Patient initially required to oral vasopressors midodrine for blood pressure support.  This was weaned off.  Cardiology consulted for paroxysmal atrial  fibrillation with rapid ventricular response.  Started on amiodarone gtt.  Transition to 400 mg p.o. twice daily on 12/23.  Tapering dose ordered at time of discharge.  Patient needs to follow-up with cardiologist Dr. Chancy Milroy on discharge.  Patient's hospital course also complicated by herpes zoster ophthalmicus.  Ophthalmology contacted.  Recommended Valtrex now milligrams p.o. 3 times daily x7 days.  Completed course in hospital.  At time of discharge will recommend prednisolone eyedrops 4 times daily.  Patient is to follow-up with ophthalmology within 1 week of discharge, Dr. Wallace Going  Patient also had acute renal failure on admission with the creatinine 2.3.  Baseline creatinine 0.98.  This recovered at time of discharge with IV fluid hydration and treatment of UTI with antibiotics.  Strongly recommend regular monitoring of patient's renal function post discharge as he has multiple medical comorbidities and is high risk for further renal decompensation.  Update: Predischarge COVID/flu swab performed.  Patient influenza positive.  Not exhibiting acutely infectious symptoms.  Will start Tamiflu 75 mg p.o. twice daily.  Remained stable for return to [facility].   Discharge Diagnoses:  Principal Problem:   Severe sepsis with septic shock ([place]) Active Problems:   AF (paroxysmal atrial fibrillation) (HCC)   Benign prostatic hyperplasia with lower urinary tract symptoms   Essential hypertension   Sepsis (HCC)   Iron deficiency anemia   E. coli UTI   Herpes zoster conjunctivitis of left eye  Severe sepsis, POA ESBL E. coli UTI Patient presenting to ED with generalized weakness and was found to have an elevated WBC count of 11.3 in the setting of chronic suprapubic Foley catheter, with lactic acid 3.8, INR 2.2.  Patient was found to be hypotensive, but responsive to IV fluid bolus.  Patient was reportedly confused with associated  acute renal failure at time of admission.  Was started on empiric  antibiotics with vancomycin and cefepime.  Urinalysis with large leukocytes, positive nitrite, many bacteria, greater than 50 WBCs.  Urine culture positive for ESBL E. coli.  Blood cultures negative x 5 days.  Blood pressure is now stabilized and midodrine was titrated off.  Completed course of antibiotics with cefepime/ceftriaxone.  Suprapubic catheter exchanged on 03/21/2021. Plan: No further antibiotics No midodrine Monitor vitals and fever curve  Influenza A Only noted incidentally at time of discharge.  No symptoms.  No fevers Plan: Remains appropriate for discharge.  Tamiflu 75 mg p.o. twice daily prescribed.  Antitussives as needed   Severe constipation with abdominal distention KUB notable for dynamic ileus with stool. LBM reported as of 12/25 and 12/26 Plan: Resume PTA bowel regimen.  Patient will likely need to daily enemas at time of discharge.  Mineral enemas listed on medication reconciliation.  Paroxysmal atrial fibrillation with RVR -- Cardiology consulted.  Amiodarone drip initially.  Transition to p.o. amiodarone at time of discharge.  400 mg twice daily initially, then taper dose to 200 mg twice daily followed by 200 mg daily.  Follow-up outpatient Dr. Park Breed within 1 to 2 weeks   Lower extremity edema Patient reports worsening lower extremity edema.  D-dimer on admission within normal limits; and repeat remains negative. -- Near net 0 for admission -- Can resume p.o. diuretics at time of discharge   Acute renal failure: Resolved Creatinine on admission 2.03, improved with IV fluid hydration and treatment of UTI with antibiotics. --2.03>>>0.98 --Outpatient follow-up.  strongly recommend regular monitoring of kidney function to 1 to 2 weeks   Depression/anxiety: --Lexapro 5 mg p.o. daily   Herpes zoster ophthalmicus --Valtrex 1000 mg p.o. 3 times daily x 7 days (completed dose in house) --Prednisolone eyedrops 3 times daily (prescribed at discharge) --Outpatient  follow-up with ophthalmology, Dr. Inez Pilgrim.  Patient needs to be seen in 1 week  Discharge Instructions  Discharge Instructions     Diet - low sodium heart healthy   Complete by: As directed    Increase activity slowly   Complete by: As directed       Allergies as of 03/26/2021   No Known Allergies      Medication List     STOP taking these medications    nitrofurantoin 100 MG capsule Commonly known as: MACRODANTIN   valACYclovir 1000 MG tablet Commonly known as: Valtrex       TAKE these medications    acetaminophen 325 MG tablet Commonly known as: TYLENOL Take 650 mg by mouth every 4 (four) hours as needed for mild pain.   Albuterol Sulfate 108 (90 Base) MCG/ACT Aepb Commonly known as: PROAIR RESPICLICK Inhale 2 puffs into the lungs every 4 (four) hours as needed (COPD).   amiodarone 400 MG tablet Commonly known as: PACERONE Take 1 tablet (400 mg total) by mouth 2 (two) times daily for 3 days, THEN 0.5 tablets (200 mg total) 2 (two) times daily for 7 days, THEN 0.5 tablets (200 mg total) daily. Start taking on: March 26, 2021 What changed:  medication strength See the new instructions.   apixaban 5 MG Tabs tablet Commonly known as: ELIQUIS Take 5 mg by mouth 2 (two) times daily.   atorvastatin 20 MG tablet Commonly known as: LIPITOR Take 20 mg by mouth at bedtime.   bisacodyl 10 MG suppository Commonly known as: DULCOLAX Place 10 mg rectally daily as needed for moderate constipation.  calcium carbonate 500 MG chewable tablet Commonly known as: TUMS - dosed in mg elemental calcium Chew 2 tablets by mouth every 6 (six) hours as needed for indigestion or heartburn.   carbidopa-levodopa 25-100 MG tablet Commonly known as: SINEMET IR Take 1 tablet by mouth 3 (three) times daily before meals.   cetaphil cream Apply 1 application topically 2 (two) times daily as needed (dry skin). (Apply to entire body)   escitalopram 5 MG tablet Commonly  known as: LEXAPRO Take 5 mg by mouth daily.   feeding supplement (GLUCERNA SHAKE) Liqd Take 237 mLs by mouth 2 (two) times daily between meals.   finasteride 5 MG tablet Commonly known as: PROSCAR Take 5 mg by mouth daily.   fluticasone 50 MCG/ACT nasal spray Commonly known as: FLONASE Place 1 spray into both nostrils daily.   gabapentin 800 MG tablet Commonly known as: NEURONTIN Take 800 mg by mouth 2 (two) times daily.   guaiFENesin 600 MG 12 hr tablet Commonly known as: MUCINEX Take 1 tablet (600 mg total) by mouth 2 (two) times daily.   hydrOXYzine 25 MG tablet Commonly known as: ATARAX Take 25 mg by mouth 2 (two) times daily as needed for itching.   ipratropium 0.03 % nasal spray Commonly known as: ATROVENT Place 2 sprays into both nostrils 2 (two) times daily.   ipratropium-albuterol 0.5-2.5 (3) MG/3ML Soln Commonly known as: DUONEB Inhale 3 mLs into the lungs daily.   lactase 3000 units tablet Commonly known as: LACTAID Take 3,000 Units by mouth 3 (three) times daily with meals.   Menthol (Topical Analgesic) 4 % Gel Apply 1 application topically 3 (three) times daily. (Apply to neck)   metFORMIN 500 MG 24 hr tablet Commonly known as: GLUCOPHAGE-XR Take 500 mg by mouth daily with supper.   methocarbamol 500 MG tablet Commonly known as: ROBAXIN Take 1,000 mg by mouth in the morning and at bedtime.   metoprolol tartrate 25 MG tablet Commonly known as: LOPRESSOR Take 1 tablet (25 mg total) by mouth 3 (three) times daily. What changed:  medication strength how much to take when to take this   mineral oil enema Place 133 mLs (1 enema total) rectally daily.   oseltamivir 75 MG capsule Commonly known as: TAMIFLU Take 1 capsule (75 mg total) by mouth 2 (two) times daily for 5 days.   oxybutynin 10 MG 24 hr tablet Commonly known as: DITROPAN-XL Take 1 tablet (10 mg total) by mouth daily.   pantoprazole 40 MG tablet Commonly known as: PROTONIX Take 1  tablet (40 mg total) by mouth 2 (two) times daily before a meal. For 8 weeks. What changed: additional instructions   polyethylene glycol 17 g packet Commonly known as: MIRALAX / GLYCOLAX Take 17 g by mouth daily.   prednisoLONE acetate 1 % ophthalmic suspension Commonly known as: PRED FORTE Place 1 drop into both eyes 4 (four) times daily.   psyllium 58.6 % powder Commonly known as: METAMUCIL Take 1 packet by mouth daily.   selenium sulfide 1 % Lotn Commonly known as: SELSUN Apply 1 application topically 2 (two) times a week. (Tuesday and Friday)   Systane Balance 0.6 % Soln Generic drug: Propylene Glycol Place 2 drops into both eyes in the morning, at noon, and at bedtime.   Trelegy Ellipta 100-62.5-25 MCG/ACT Aepb Generic drug: Fluticasone-Umeclidin-Vilant Inhale 1 puff into the lungs daily.   triamcinolone cream 0.1 % Commonly known as: KENALOG Apply 1 application topically 2 (two) times daily.  Follow-up [place]. Schedule an appointment as soon as possible for a visit in 1 week(s).   Specialty: General Practice Contact information: [address] [place] [phone]         Leandrew Koyanagi, MD. Schedule an appointment as soon as possible for a visit in 1 week(s).   Specialty: Ophthalmology Contact information: [address]   [place] [address] [phone]         Dionisio David, MD. Schedule an appointment as soon as possible for a visit in 1 week(s).   Specialty: Cardiology Contact information: [place] [address] [phone]                No Known Allergies  Consultations: Cardiology Urology Ophthalmology   Procedures/Studies: DG Abd 1 View  Result Date: 03/21/2021 CLINICAL DATA:  Ileus, history of appendectomy EXAM: ABDOMEN - 1 VIEW COMPARISON:  Abdominal radiograph dated March 19, 2021 FINDINGS: Gaseous distention of multiple bowel  loops in a nonobstructive pattern. The findings suggest ileus. Bilateral hip arthroplasty. 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 IMPRESSION: Gaseous distention of multiple bowel lobes likely representing ileus, unchanged. Electronically Signed   By: Keane Police D.O.   On: 03/21/2021 09:39   DG Abd 1 View  Result Date: 03/19/2021 CLINICAL DATA:  Shortness of breath EXAM: ABDOMEN - 1 VIEW COMPARISON:  01/31/2021 FINDINGS: Diffuse colonic gas with distension. Diffuse small bowel gas with less prominent distension. Mild retained stool mainly seen in the pelvis. Similar pattern was seen previously. No concerning mass effect or gas collection. Bilateral hip arthroplasty. IMPRESSION: Diffuse gaseous distension of bowel suggesting adynamic ileus, also seen previously. Electronically Signed   By: Jorje Guild M.D.   On: 03/19/2021 04:29   DG Chest Port 1 View  Result Date: 03/22/2021 CLINICAL DATA:  Shortness of breath EXAM: PORTABLE CHEST 1 VIEW COMPARISON:  03/16/2021 FINDINGS: The heart size and mediastinal contours are within normal limits. Shallow inspiration with low lung volumes. Minimal atelectasis at the right lung base. No pleural effusion or pneumothorax. The visualized skeletal structures are unremarkable. IMPRESSION: Low lung volumes.  Minimal atelectasis at the right lung base. Electronically Signed   By: Macy Mis M.D.   On: 03/22/2021 11:10   DG Chest Portable 1 View  Result Date: 03/16/2021 CLINICAL DATA:  Weakness and hypoxia EXAM: PORTABLE CHEST 1 VIEW COMPARISON:  Chest radiograph 01/31/2021 FINDINGS: The heart is mildly enlarged, unchanged. The mediastinal contours are stable. Mitral valve clips are again noted. Lung volumes are low. There is no focal consolidation or pulmonary edema. There is no pleural effusion or pneumothorax. There is no acute osseous abnormality. IMPRESSION: No radiographic evidence of acute cardiopulmonary process.  Electronically Signed   By: Valetta Mole M.D.   On: 03/16/2021 16:29      Subjective: Patient seen and examined on day of discharge.  Resumed baseline level of functioning.  Stable for discharge.  Will return to long-term care facility  Discharge Exam: Vitals:   03/26/21 0747 03/26/21 1125  BP:  (!) 117/97  Pulse:  (!) 56  Resp:  16  Temp:  98 F (36.7 C)  SpO2: 94% 94%   Vitals:   03/26/21 0500 03/26/21 0743 03/26/21 0747 03/26/21 1125  BP:  113/78  (!) 117/97  Pulse:  73  (!) 56  Resp:  16  16  Temp:  98.4 F (36.9 C)  98 F (36.7 C)  TempSrc:  Oral  Oral  SpO2:  97% 94% 94%  Weight: 102.1 kg     Height:        General: Pt is alert, awake, not in acute distress.  Appears chronically ill Cardiovascular: RRR, S1/S2 +, no rubs, no gallops Respiratory: Coarse breath sounds bilaterally.  Normal work of breathing.  Room air Abdominal: Soft, NT, ND, bowel sounds + Extremities: no edema, no cyanosis    The results of significant diagnostics from this hospitalization (including imaging, microbiology, ancillary and laboratory) are listed below for reference.     Microbiology: Recent Results (from the past 240 hour(s))  Culture, blood (routine x 2)     Status: None   Collection Time: 03/16/21  3:51 PM   Specimen: BLOOD  Result Value Ref Range Status   Specimen Description BLOOD RIGHT [facility]  Final   Special Requests   Final    BOTTLES DRAWN AEROBIC AND ANAEROBIC Blood Culture adequate volume   Culture   Final    NO GROWTH 5 DAYS Performed at [facility], [address] [place], [place] [address]    Report Status 03/21/2021 FINAL  Final  Resp Panel by RT-PCR (Flu A&B, Covid) Nasopharyngeal Swab     Status: None   Collection Time: 03/16/21  3:51 PM   Specimen: Nasopharyngeal Swab; Nasopharyngeal(NP) swabs in vial transport medium  Result Value Ref Range Status   SARS Coronavirus 2 by RT PCR NEGATIVE NEGATIVE Final    Comment: (NOTE) SARS-CoV-2 target nucleic  acids are NOT DETECTED.  The SARS-CoV-2 RNA is generally detectable in upper respiratory specimens during the acute phase of infection. The lowest concentration of SARS-CoV-2 viral copies this assay can detect is 138 copies/mL. A negative result does not preclude SARS-Cov-2 infection and should not be used as the sole basis for treatment or other patient management decisions. A negative result may occur with  improper specimen collection/handling, submission of specimen other than nasopharyngeal swab, presence of viral mutation(s) within the areas targeted by this assay, and inadequate number of viral copies(<138 copies/mL). A negative result must be combined with clinical observations, patient history, and epidemiological information. The expected result is Negative.  Fact Sheet for Patients:  BloggerCourse.com  Fact Sheet for Healthcare Providers:  SeriousBroker.it  This test is no t yet approved or cleared by the [place] FDA and  has been authorized for detection and/or diagnosis of SARS-CoV-2 by FDA under an Emergency Use Authorization (EUA). This EUA will remain  in effect (meaning this test can be used) for the duration of the COVID-19 declaration under Section 564(b)(1) of the Act, 21 U.S.C.section 360bbb-3(b)(1), unless the authorization is terminated  or revoked sooner.       Influenza A by PCR NEGATIVE NEGATIVE Final   Influenza B by PCR NEGATIVE NEGATIVE Final    Comment: (NOTE) The Xpert Xpress SARS-CoV-2/FLU/RSV plus assay is intended as an aid in the diagnosis of influenza from Nasopharyngeal swab specimens and should not be used as a sole basis for treatment. Nasal washings and aspirates are unacceptable for Xpert Xpress SARS-CoV-2/FLU/RSV testing.  Fact Sheet for Patients: BloggerCourse.com  Fact Sheet for Healthcare Providers: SeriousBroker.it  This  test is not yet approved or cleared by the [place] FDA and has been authorized for detection and/or diagnosis of SARS-CoV-2 by FDA under an Emergency Use Authorization (EUA). This EUA will remain in effect (meaning this test can be used) for the duration of the COVID-19 declaration under Section 564(b)(1) of the Act,  21 U.S.C. section 360bbb-3(b)(1), unless the authorization is terminated or revoked.  Performed at [facility], [address], [place], [place] [address]   Urine Culture     Status: Abnormal   Collection Time: 03/16/21  6:38 PM   Specimen: Urine, Random  Result Value Ref Range Status   Specimen Description   Final    URINE, RANDOM Performed at [facility], [address], [place], [place] [address]    Special Requests   Final    NONE Performed at [facility], [address] [place], [place] [address]    Culture (A)  Final    80,000 COLONIES/mL ESCHERICHIA COLI Confirmed Extended Spectrum Beta-Lactamase Producer (ESBL).  In bloodstream infections from ESBL organisms, carbapenems are preferred over piperacillin/tazobactam. They are shown to have a lower risk of mortality.    Report Status 03/19/2021 FINAL  Final   Organism ID, Bacteria ESCHERICHIA COLI (A)  Final      Susceptibility   Escherichia coli - MIC*    AMPICILLIN >=32 RESISTANT Resistant     CEFAZOLIN >=64 RESISTANT Resistant     CEFEPIME 16 RESISTANT Resistant     CEFTRIAXONE >=64 RESISTANT Resistant     CIPROFLOXACIN >=4 RESISTANT Resistant     GENTAMICIN <=1 SENSITIVE Sensitive     IMIPENEM <=0.25 SENSITIVE Sensitive     NITROFURANTOIN <=16 SENSITIVE Sensitive     TRIMETH/SULFA >=320 RESISTANT Resistant     AMPICILLIN/SULBACTAM >=32 RESISTANT Resistant     PIP/TAZO <=4 SENSITIVE Sensitive     * 80,000 COLONIES/mL ESCHERICHIA COLI  Culture, blood (routine x 2)     Status: None   Collection Time: 03/16/21 10:53 PM   Specimen: BLOOD  Result Value Ref Range  Status   Specimen Description BLOOD BLOOD RIGHT HAND  Final   Special Requests   Final    BOTTLES DRAWN AEROBIC AND ANAEROBIC Blood Culture adequate volume   Culture   Final    NO GROWTH 5 DAYS Performed at [facility], [address] [place], [place] [address]    Report Status 03/21/2021 FINAL  Final  Resp Panel by RT-PCR (Flu A&B, Covid) Nasopharyngeal Swab     Status: Abnormal   Collection Time: 03/26/21 11:56 AM   Specimen: Nasopharyngeal Swab; Nasopharyngeal(NP) swabs in vial transport medium  Result Value Ref Range Status   SARS Coronavirus 2 by RT PCR NEGATIVE NEGATIVE Final    Comment: (NOTE) SARS-CoV-2 target nucleic acids are NOT DETECTED.  The SARS-CoV-2 RNA is generally detectable in upper respiratory specimens during the acute phase of infection. The lowest concentration of SARS-CoV-2 viral copies this assay can detect is 138 copies/mL. A negative result does not preclude SARS-Cov-2 infection and should not be used as the sole basis for treatment or other patient management decisions. A negative result may occur with  improper specimen collection/handling, submission of specimen other than nasopharyngeal swab, presence of viral mutation(s) within the areas targeted by this assay, and inadequate number of viral copies(<138 copies/mL). A negative result must be combined with clinical observations, patient history, and epidemiological information. The expected result is Negative.  Fact Sheet for Patients:  EntrepreneurPulse.com.au  Fact Sheet for Healthcare Providers:  IncredibleEmployment.be  This test is no t yet approved or cleared by the [place] FDA and  has been authorized for detection and/or diagnosis of SARS-CoV-2 by FDA under an Emergency Use Authorization (EUA). This EUA will remain  in effect (meaning this test can be used) for the duration  of the COVID-19 declaration under Section 564(b)(1) of the Act,  21 U.S.C.section 360bbb-3(b)(1), unless the authorization is terminated  or revoked sooner.       Influenza A by PCR POSITIVE (A) NEGATIVE Final   Influenza B by PCR NEGATIVE NEGATIVE Final    Comment: (NOTE) The Xpert Xpress SARS-CoV-2/FLU/RSV plus assay is intended as an aid in the diagnosis of influenza from Nasopharyngeal swab specimens and should not be used as a sole basis for treatment. Nasal washings and aspirates are unacceptable for Xpert Xpress SARS-CoV-2/FLU/RSV testing.  Fact Sheet for Patients: EntrepreneurPulse.com.au  Fact Sheet for Healthcare Providers: IncredibleEmployment.be  This test is not yet approved or cleared by the [place] FDA and has been authorized for detection and/or diagnosis of SARS-CoV-2 by FDA under an Emergency Use Authorization (EUA). This EUA will remain in effect (meaning this test can be used) for the duration of the COVID-19 declaration under Section 564(b)(1) of the Act, 21 U.S.C. section 360bbb-3(b)(1), unless the authorization is terminated or revoked.  Performed at [facility], [address] [place], [place] [address]      Labs: BNP (last 3 results) Recent Labs    01/09/21 1547 01/29/21 1758 02/02/21 0434  BNP 263.7* 232.9* [id number]   Basic Metabolic Panel: Recent Labs  Lab 03/22/21 0653 03/23/21 0512 03/24/21 0617  NA 135 135 136  K 4.2 3.2* 4.1  CL 105 102 103  CO2 25 27 26   GLUCOSE 100* 110* 128*  BUN 24* 23 23  CREATININE 1.04 0.98 0.95  CALCIUM 9.0 8.5* 8.4*  MG 2.0  --  1.6*   Liver Function Tests: No results for input(s): AST, ALT, ALKPHOS, BILITOT, PROT, ALBUMIN in the last 168 hours. No results for input(s): LIPASE, AMYLASE in the last 168 hours. No results for input(s): AMMONIA in the last 168 hours. CBC: Recent Labs  Lab 03/22/21 0653  WBC 7.3  HGB 12.0*  HCT 36.1*  MCV 93.5  PLT 162   Cardiac Enzymes: No results for input(s): CKTOTAL,  CKMB, CKMBINDEX, TROPONINI in the last 168 hours. BNP: Invalid input(s): POCBNP CBG: Recent Labs  Lab 03/25/21 1201 03/25/21 1707 03/25/21 2045 03/26/21 0740 03/26/21 1124  GLUCAP 127* 115* 167* 121* 146*   D-Dimer No results for input(s): DDIMER in the last 72 hours. Hgb A1c No results for input(s): HGBA1C in the last 72 hours. Lipid Profile No results for input(s): CHOL, HDL, LDLCALC, TRIG, CHOLHDL, LDLDIRECT in the last 72 hours. Thyroid function studies No results for input(s): TSH, T4TOTAL, T3FREE, THYROIDAB in the last 72 hours.  Invalid input(s): FREET3 Anemia work up No results for input(s): VITAMINB12, FOLATE, FERRITIN, TIBC, IRON, RETICCTPCT in the last 72 hours. Urinalysis    Component Value Date/Time   COLORURINE YELLOW (A) 03/16/2021 1838   APPEARANCEUR CLOUDY (A) 03/16/2021 1838   LABSPEC 1.015 03/16/2021 1838   PHURINE 5.0 03/16/2021 1838   GLUCOSEU NEGATIVE 03/16/2021 1838   HGBUR LARGE (A) 03/16/2021 1838   BILIRUBINUR NEGATIVE 03/16/2021 1838   KETONESUR 5 (A) 03/16/2021 1838   PROTEINUR NEGATIVE 03/16/2021 1838   NITRITE POSITIVE (A) 03/16/2021 1838   LEUKOCYTESUR LARGE (A) 03/16/2021 1838   Sepsis Labs Invalid input(s): PROCALCITONIN,  WBC,  LACTICIDVEN Microbiology Recent Results (from the past 240 hour(s))  Culture, blood (routine x 2)     Status: None   Collection Time: 03/16/21  3:51 PM   Specimen: BLOOD  Result Value Ref Range Status   Specimen Description BLOOD RIGHT [facility]  Final  Special Requests   Final    BOTTLES DRAWN AEROBIC AND ANAEROBIC Blood Culture adequate volume   Culture   Final    NO GROWTH 5 DAYS Performed at [facility], [address] [place], [place] [address]    Report Status 03/21/2021 FINAL  Final  Resp Panel by RT-PCR (Flu A&B, Covid) Nasopharyngeal Swab     Status: None   Collection Time: 03/16/21  3:51 PM   Specimen: Nasopharyngeal Swab; Nasopharyngeal(NP) swabs in vial transport medium  Result Value  Ref Range Status   SARS Coronavirus 2 by RT PCR NEGATIVE NEGATIVE Final    Comment: (NOTE) SARS-CoV-2 target nucleic acids are NOT DETECTED.  The SARS-CoV-2 RNA is generally detectable in upper respiratory specimens during the acute phase of infection. The lowest concentration of SARS-CoV-2 viral copies this assay can detect is 138 copies/mL. A negative result does not preclude SARS-Cov-2 infection and should not be used as the sole basis for treatment or other patient management decisions. A negative result may occur with  improper specimen collection/handling, submission of specimen other than nasopharyngeal swab, presence of viral mutation(s) within the areas targeted by this assay, and inadequate number of viral copies(<138 copies/mL). A negative result must be combined with clinical observations, patient history, and epidemiological information. The expected result is Negative.  Fact Sheet for Patients:  EntrepreneurPulse.com.au  Fact Sheet for Healthcare Providers:  IncredibleEmployment.be  This test is no t yet approved or cleared by the [place] FDA and  has been authorized for detection and/or diagnosis of SARS-CoV-2 by FDA under an Emergency Use Authorization (EUA). This EUA will remain  in effect (meaning this test can be used) for the duration of the COVID-19 declaration under Section 564(b)(1) of the Act, 21 U.S.C.section 360bbb-3(b)(1), unless the authorization is terminated  or revoked sooner.       Influenza A by PCR NEGATIVE NEGATIVE Final   Influenza B by PCR NEGATIVE NEGATIVE Final    Comment: (NOTE) The Xpert Xpress SARS-CoV-2/FLU/RSV plus assay is intended as an aid in the diagnosis of influenza from Nasopharyngeal swab specimens and should not be used as a sole basis for treatment. Nasal washings and aspirates are unacceptable for Xpert Xpress SARS-CoV-2/FLU/RSV testing.  Fact Sheet for  Patients: EntrepreneurPulse.com.au  Fact Sheet for Healthcare Providers: IncredibleEmployment.be  This test is not yet approved or cleared by the [place] FDA and has been authorized for detection and/or diagnosis of SARS-CoV-2 by FDA under an Emergency Use Authorization (EUA). This EUA will remain in effect (meaning this test can be used) for the duration of the COVID-19 declaration under Section 564(b)(1) of the Act, 21 U.S.C. section 360bbb-3(b)(1), unless the authorization is terminated or revoked.  Performed at [facility], [address] [place], [place] [address]   Urine Culture     Status: Abnormal   Collection Time: 03/16/21  6:38 PM   Specimen: Urine, Random  Result Value Ref Range Status   Specimen Description   Final    URINE, RANDOM Performed at [facility], [address] [place], [place] [address]    Special Requests   Final    NONE Performed at [facility], [address] [place], [place] [address]    Culture (A)  Final    80,000 COLONIES/mL ESCHERICHIA COLI Confirmed Extended Spectrum Beta-Lactamase Producer (ESBL).  In bloodstream infections from ESBL organisms, carbapenems are preferred over piperacillin/tazobactam. They are shown to have a lower risk of mortality.    Report Status  03/19/2021 FINAL  Final   Organism ID, Bacteria ESCHERICHIA COLI (A)  Final      Susceptibility   Escherichia coli - MIC*    AMPICILLIN >=32 RESISTANT Resistant     CEFAZOLIN >=64 RESISTANT Resistant     CEFEPIME 16 RESISTANT Resistant     CEFTRIAXONE >=64 RESISTANT Resistant     CIPROFLOXACIN >=4 RESISTANT Resistant     GENTAMICIN <=1 SENSITIVE Sensitive     IMIPENEM <=0.25 SENSITIVE Sensitive     NITROFURANTOIN <=16 SENSITIVE Sensitive     TRIMETH/SULFA >=320 RESISTANT Resistant     AMPICILLIN/SULBACTAM >=32 RESISTANT Resistant     PIP/TAZO <=4 SENSITIVE Sensitive     * 80,000 COLONIES/mL  ESCHERICHIA COLI  Culture, blood (routine x 2)     Status: None   Collection Time: 03/16/21 10:53 PM   Specimen: BLOOD  Result Value Ref Range Status   Specimen Description BLOOD BLOOD RIGHT HAND  Final   Special Requests   Final    BOTTLES DRAWN AEROBIC AND ANAEROBIC Blood Culture adequate volume   Culture   Final    NO GROWTH 5 DAYS Performed at [facility], [address] [place], [place] [address]    Report Status 03/21/2021 FINAL  Final  Resp Panel by RT-PCR (Flu A&B, Covid) Nasopharyngeal Swab     Status: Abnormal   Collection Time: 03/26/21 11:56 AM   Specimen: Nasopharyngeal Swab; Nasopharyngeal(NP) swabs in vial transport medium  Result Value Ref Range Status   SARS Coronavirus 2 by RT PCR NEGATIVE NEGATIVE Final    Comment: (NOTE) SARS-CoV-2 target nucleic acids are NOT DETECTED.  The SARS-CoV-2 RNA is generally detectable in upper respiratory specimens during the acute phase of infection. The lowest concentration of SARS-CoV-2 viral copies this assay can detect is 138 copies/mL. A negative result does not preclude SARS-Cov-2 infection and should not be used as the sole basis for treatment or other patient management decisions. A negative result may occur with  improper specimen collection/handling, submission of specimen other than nasopharyngeal swab, presence of viral mutation(s) within the areas targeted by this assay, and inadequate number of viral copies(<138 copies/mL). A negative result must be combined with clinical observations, patient history, and epidemiological information. The expected result is Negative.  Fact Sheet for Patients:  EntrepreneurPulse.com.au  Fact Sheet for Healthcare Providers:  IncredibleEmployment.be  This test is no t yet approved or cleared by the [place] FDA and  has been authorized for detection and/or diagnosis of SARS-CoV-2 by FDA under an Emergency Use Authorization (EUA).  This EUA will remain  in effect (meaning this test can be used) for the duration of the COVID-19 declaration under Section 564(b)(1) of the Act, 21 U.S.C.section 360bbb-3(b)(1), unless the authorization is terminated  or revoked sooner.       Influenza A by PCR POSITIVE (A) NEGATIVE Final   Influenza B by PCR NEGATIVE NEGATIVE Final    Comment: (NOTE) The Xpert Xpress SARS-CoV-2/FLU/RSV plus assay is intended as an aid in the diagnosis of influenza from Nasopharyngeal swab specimens and should not be used as a sole basis for treatment. Nasal washings and aspirates are unacceptable for Xpert Xpress SARS-CoV-2/FLU/RSV testing.  Fact Sheet for Patients: EntrepreneurPulse.com.au  Fact Sheet for Healthcare Providers: IncredibleEmployment.be  This test is not yet approved or cleared by the [place] FDA and has been authorized for detection and/or diagnosis of SARS-CoV-2 by FDA under an Emergency Use Authorization (EUA). This EUA will remain in effect (meaning this test can  be used) for the duration of the COVID-19 declaration under Section 564(b)(1) of the Act, 21 U.S.C. section 360bbb-3(b)(1), unless the authorization is terminated or revoked.  Performed at [facility], [address], [place], [place] [address]      Time coordinating discharge: Over 30 minutes  SIGNED:   Sidney Ace, MD  [facility] 03/26/2021, 1:57 PM Pager   If 7PM-7AM, please contact night-coverage

## 2021-03-26 NOTE — Progress Notes (Signed)
Report given to [facility] RN at [facility]

## 2021-03-26 NOTE — TOC Transition Note (Addendum)
Transition of Care [facility]) - CM/SW Discharge Note   Patient Details  Name: Raymond Hays MRN: [id number] Date of Birth: 02/10/1946  [facility] CM/SW Contact:  Gildardo Griffes, LCSW Phone Number: 03/26/2021, 1:23 PM   Clinical Narrative:     Patient will DC to: [facility] Anticipated DC date: 03/26/21 Family informed : sister Debbie informed  Transport by: Wendie Simmer  Per MD patient ready for DC to [facility] . RN, patient, patient's family, and facility notified of DC. Discharge Summary sent to facility. RN given number for report  [phone] Room 313A. DC packet on chart. Ambulance transport requested for patient.  CSW signing off.  Angeline Slim, LCSW    Final next level of care: Skilled Nursing Facility Barriers to Discharge: No Barriers Identified   Patient Goals and CMS Choice Patient states their goals for this hospitalization and ongoing recovery are:: to go home CMS Medicare.gov Compare Post Acute Care list provided to:: Patient Choice offered to / list presented to : Patient  Discharge Placement                Patient to be transferred to facility by: [facility]   Patient and family notified of of transfer: 03/26/21  Discharge Plan and Services                                     Social Determinants of Health (SDOH) Interventions     Readmission Risk Interventions Readmission Risk Prevention Plan 01/12/2021 10/20/2020  Transportation Screening Complete Complete  PCP or Specialist Appt within 3-5 Days - Complete  HRI or Home Care Consult - Complete  Palliative Care Screening - Not Applicable  Medication Review (RN Care Manager) Complete Referral to Pharmacy  PCP or Specialist appointment within 3-5 days of discharge Complete -  HRI or Home Care Consult Complete -  SW Recovery Care/Counseling Consult Complete -  Palliative Care Screening Not Applicable -  Skilled Nursing Facility Complete -

## 2021-03-26 NOTE — Progress Notes (Signed)
Nutrition Follow-up  DOCUMENTATION CODES:   Not applicable  INTERVENTION:   -Continue Glucerna Shake po BID, each supplement provides 220 kcal and 10 grams of protein  -Continue Ensure Max po daily, each supplement provides 150 kcal and 30 grams of protein -Continue MVI with minerals daily    NUTRITION DIAGNOSIS:   Increased nutrient needs related to chronic illness (COPD) as evidenced by estimated needs.  Ongoing  GOAL:   Patient will meet greater than or equal to 90% of their needs  Progressing   MONITOR:   PO intake, Supplement acceptance, Labs, Weight trends, Skin, I & O's  REASON FOR ASSESSMENT:   Low Braden    ASSESSMENT:   Raymond Hays is a 75 y.o. male with medical history significant for COPD, non-insulin-dependent diabetes mellitus, hypertension, history of hyperlipidemia, paroxysmal atrial fibrillation, osteoarthritis, history of spinal stenosis, on anticoagulation with eliquis, urinary retention, chronic indwelling Foley catheter, who presents emergency department for chief concerns of generalized weakness and health decline for 3 days.  Reviewed I/O's: -760 ml x 24 hours and -24 ml since admission  UOP: 1.3 L x 24 hours   Pt sleeping soundly at time of visit and did not arouse to name being called. Noted pt consumed 100% of breakfast tray.   Pt remains with good appetite. Noted meal completions 50-100%. Pt is consuming Glucerna and Ensure Max supplements.   Per TOC notes, plan to d/c back to [facility] today.   Medications reviewed and include sinemet, lasix, miralax, and senokot.   Labs reviewed: CBGS: 115-207 (inpatient orders for glycemic control are 0-5 units insulin aspart daily at bedtime and 0-9 units insulin aspart TID with meals).    Diet Order:   Diet Order             Diet - low sodium heart healthy           Diet heart healthy/carb modified Room service appropriate? Yes; Fluid consistency: Thin  Diet effective now                    EDUCATION NEEDS:   Education needs have been addressed  Skin:  Skin Assessment: Reviewed RN Assessment  Last BM:  03/21/21  Height:   Ht Readings from Last 1 Encounters:  03/16/21 5\' 8"  (1.727 m)    Weight:   Wt Readings from Last 1 Encounters:  03/26/21 102.1 kg    Ideal Body Weight:  70 kg  BMI:  Body mass index is 34.22 kg/m.  Estimated Nutritional Needs:   Kcal:  2000-2200  Protein:  105-120 grams  Fluid:  > 2 L    03/28/21, RD, LDN, CDCES Registered Dietitian II Certified Diabetes Care and Education Specialist Please refer to [facility] for RD and/or RD on-call/weekend/after hours pager

## 2021-03-27 LAB — BLOOD GAS, VENOUS
Acid-Base Excess: 4.5 mmol/L — ABNORMAL HIGH (ref 0.0–2.0)
Bicarbonate: 30.4 mmol/L — ABNORMAL HIGH (ref 20.0–28.0)
O2 Saturation: 42.7 %
Patient temperature: 37
pCO2, Ven: 49 mmHg (ref 44.0–60.0)
pH, Ven: 7.4 (ref 7.250–7.430)

## 2021-04-03 ENCOUNTER — Other Ambulatory Visit: Payer: Self-pay

## 2021-04-03 ENCOUNTER — Emergency Department: Payer: No Typology Code available for payment source

## 2021-04-03 ENCOUNTER — Inpatient Hospital Stay
Admission: EM | Admit: 2021-04-03 | Discharge: 2021-04-09 | DRG: 871 | Disposition: A | Discharge status: Skilled Nursing Facility | Payer: No Typology Code available for payment source | Source: Skilled Nursing Facility | Attending: Hospitalist | Admitting: Hospitalist

## 2021-04-03 DIAGNOSIS — I878 Other specified disorders of veins: Secondary | ICD-10-CM | POA: Diagnosis present

## 2021-04-03 DIAGNOSIS — I119 Hypertensive heart disease without heart failure: Secondary | ICD-10-CM | POA: Diagnosis present

## 2021-04-03 DIAGNOSIS — R0602 Shortness of breath: Secondary | ICD-10-CM

## 2021-04-03 DIAGNOSIS — E875 Hyperkalemia: Secondary | ICD-10-CM | POA: Diagnosis present

## 2021-04-03 DIAGNOSIS — Z66 Do not resuscitate: Secondary | ICD-10-CM | POA: Diagnosis present

## 2021-04-03 DIAGNOSIS — E878 Other disorders of electrolyte and fluid balance, not elsewhere classified: Secondary | ICD-10-CM | POA: Diagnosis present

## 2021-04-03 DIAGNOSIS — R6520 Severe sepsis without septic shock: Secondary | ICD-10-CM | POA: Diagnosis present

## 2021-04-03 DIAGNOSIS — Z20822 Contact with and (suspected) exposure to covid-19: Secondary | ICD-10-CM | POA: Diagnosis present

## 2021-04-03 DIAGNOSIS — N400 Enlarged prostate without lower urinary tract symptoms: Secondary | ICD-10-CM | POA: Diagnosis present

## 2021-04-03 DIAGNOSIS — E871 Hypo-osmolality and hyponatremia: Secondary | ICD-10-CM | POA: Diagnosis present

## 2021-04-03 DIAGNOSIS — F32A Depression, unspecified: Secondary | ICD-10-CM | POA: Diagnosis present

## 2021-04-03 DIAGNOSIS — N5089 Other specified disorders of the male genital organs: Secondary | ICD-10-CM | POA: Diagnosis present

## 2021-04-03 DIAGNOSIS — J440 Chronic obstructive pulmonary disease with acute lower respiratory infection: Secondary | ICD-10-CM | POA: Diagnosis present

## 2021-04-03 DIAGNOSIS — A419 Sepsis, unspecified organism: Principal | ICD-10-CM | POA: Diagnosis present

## 2021-04-03 DIAGNOSIS — I959 Hypotension, unspecified: Secondary | ICD-10-CM | POA: Diagnosis present

## 2021-04-03 DIAGNOSIS — Z87891 Personal history of nicotine dependence: Secondary | ICD-10-CM | POA: Diagnosis not present

## 2021-04-03 DIAGNOSIS — E785 Hyperlipidemia, unspecified: Secondary | ICD-10-CM | POA: Diagnosis present

## 2021-04-03 DIAGNOSIS — E1142 Type 2 diabetes mellitus with diabetic polyneuropathy: Secondary | ICD-10-CM | POA: Diagnosis present

## 2021-04-03 DIAGNOSIS — J9601 Acute respiratory failure with hypoxia: Secondary | ICD-10-CM

## 2021-04-03 DIAGNOSIS — R4182 Altered mental status, unspecified: Secondary | ICD-10-CM

## 2021-04-03 DIAGNOSIS — Z6830 Body mass index (BMI) 30.0-30.9, adult: Secondary | ICD-10-CM | POA: Diagnosis not present

## 2021-04-03 DIAGNOSIS — I480 Paroxysmal atrial fibrillation: Secondary | ICD-10-CM | POA: Diagnosis present

## 2021-04-03 DIAGNOSIS — Z7984 Long term (current) use of oral hypoglycemic drugs: Secondary | ICD-10-CM

## 2021-04-03 DIAGNOSIS — J189 Pneumonia, unspecified organism: Secondary | ICD-10-CM | POA: Diagnosis present

## 2021-04-03 DIAGNOSIS — G20 Parkinson's disease: Secondary | ICD-10-CM | POA: Diagnosis present

## 2021-04-03 DIAGNOSIS — I493 Ventricular premature depolarization: Secondary | ICD-10-CM | POA: Diagnosis present

## 2021-04-03 DIAGNOSIS — R32 Unspecified urinary incontinence: Secondary | ICD-10-CM | POA: Diagnosis present

## 2021-04-03 DIAGNOSIS — K5641 Fecal impaction: Secondary | ICD-10-CM | POA: Diagnosis present

## 2021-04-03 DIAGNOSIS — R140 Abdominal distension (gaseous): Secondary | ICD-10-CM

## 2021-04-03 DIAGNOSIS — I4891 Unspecified atrial fibrillation: Secondary | ICD-10-CM | POA: Diagnosis not present

## 2021-04-03 DIAGNOSIS — J9600 Acute respiratory failure, unspecified whether with hypoxia or hypercapnia: Secondary | ICD-10-CM | POA: Diagnosis present

## 2021-04-03 DIAGNOSIS — Z79899 Other long term (current) drug therapy: Secondary | ICD-10-CM

## 2021-04-03 DIAGNOSIS — F419 Anxiety disorder, unspecified: Secondary | ICD-10-CM | POA: Diagnosis present

## 2021-04-03 LAB — CBC WITH DIFFERENTIAL/PLATELET
Abs Immature Granulocytes: 0.06 10*3/uL (ref 0.00–0.07)
Basophils Absolute: 0 10*3/uL (ref 0.0–0.1)
Basophils Relative: 0 %
Eosinophils Absolute: 0 10*3/uL (ref 0.0–0.5)
Eosinophils Relative: 0 %
HCT: 43.1 % (ref 39.0–52.0)
Hemoglobin: 13.9 g/dL (ref 13.0–17.0)
Immature Granulocytes: 1 %
Lymphocytes Relative: 9 %
Lymphs Abs: 0.8 10*3/uL (ref 0.7–4.0)
MCH: 30.6 pg (ref 26.0–34.0)
MCHC: 32.3 g/dL (ref 30.0–36.0)
MCV: 94.9 fL (ref 80.0–100.0)
Monocytes Absolute: 0.6 10*3/uL (ref 0.1–1.0)
Monocytes Relative: 7 %
Neutro Abs: 7.6 10*3/uL (ref 1.7–7.7)
Neutrophils Relative %: 83 %
Platelets: 398 10*3/uL (ref 150–400)
RBC: 4.54 MIL/uL (ref 4.22–5.81)
RDW: 16.7 % — ABNORMAL HIGH (ref 11.5–15.5)
WBC: 9.1 10*3/uL (ref 4.0–10.5)
nRBC: 0.9 % — ABNORMAL HIGH (ref 0.0–0.2)

## 2021-04-03 LAB — BLOOD GAS, VENOUS
Acid-base deficit: 3.8 mmol/L — ABNORMAL HIGH (ref 0.0–2.0)
Bicarbonate: 23.1 mmol/L (ref 20.0–28.0)
Delivery systems: POSITIVE
FIO2: 0.7
O2 Saturation: 66.2 %
Patient temperature: 37
pCO2, Ven: 48 mmHg (ref 44.0–60.0)
pH, Ven: 7.29 (ref 7.250–7.430)
pO2, Ven: 39 mmHg (ref 32.0–45.0)

## 2021-04-03 LAB — TROPONIN I (HIGH SENSITIVITY): Troponin I (High Sensitivity): 35 ng/L — ABNORMAL HIGH (ref <18)

## 2021-04-03 LAB — BASIC METABOLIC PANEL
Anion gap: 13 (ref 5–15)
BUN: 47 mg/dL — ABNORMAL HIGH (ref 8–23)
CO2: 23 mmol/L (ref 22–32)
Calcium: 9.2 mg/dL (ref 8.9–10.3)
Chloride: 97 mmol/L — ABNORMAL LOW (ref 98–111)
Creatinine, Ser: 2.64 mg/dL — ABNORMAL HIGH (ref 0.61–1.24)
GFR, Estimated: 24 mL/min — ABNORMAL LOW (ref >60)
Glucose, Bld: 148 mg/dL — ABNORMAL HIGH (ref 70–99)
Potassium: 5.5 mmol/L — ABNORMAL HIGH (ref 3.5–5.1)
Sodium: 133 mmol/L — ABNORMAL LOW (ref 135–145)

## 2021-04-03 LAB — LACTIC ACID, PLASMA: Lactic Acid, Venous: 2.8 mmol/L (ref 0.5–1.9)

## 2021-04-03 MED ORDER — CETAPHIL MOISTURIZING EX CREA: 1.0000 "application " | TOPICAL_CREAM | Freq: Two times a day (BID) | CUTANEOUS | Status: DC | PRN

## 2021-04-03 MED ORDER — ONDANSETRON HCL 4 MG PO TABS: 4.0000 mg | ORAL_TABLET | Freq: Four times a day (QID) | ORAL | Status: DC | PRN

## 2021-04-03 MED ORDER — SODIUM CHLORIDE 0.9 % IV SOLN: INTRAVENOUS | Status: DC

## 2021-04-03 MED ORDER — INSULIN ASPART 100 UNIT/ML IJ SOLN
0.0000 [IU] | Freq: Three times a day (TID) | INTRAMUSCULAR | Status: DC
  Administered 2021-04-04: 1 [IU] via SUBCUTANEOUS
  Filled 2021-04-03: qty 1

## 2021-04-03 MED ORDER — FLUTICASONE PROPIONATE 50 MCG/ACT NA SUSP
1.0000 | Freq: Every day | NASAL | Status: DC
  Administered 2021-04-06 – 2021-04-09 (×4): 1 via NASAL
  Filled 2021-04-03 (×2): qty 16

## 2021-04-03 MED ORDER — LACTATED RINGERS IV BOLUS
1000.0000 mL | Freq: Once | INTRAVENOUS | Status: AC
  Administered 2021-04-03: 1000 mL via INTRAVENOUS

## 2021-04-03 MED ORDER — OXYBUTYNIN CHLORIDE ER 10 MG PO TB24
10.0000 mg | ORAL_TABLET | Freq: Every day | ORAL | Status: DC
  Administered 2021-04-05 – 2021-04-09 (×5): 10 mg via ORAL
  Filled 2021-04-03 (×5): qty 1

## 2021-04-03 MED ORDER — IPRATROPIUM BROMIDE 0.03 % NA SOLN
2.0000 | Freq: Two times a day (BID) | NASAL | Status: DC
  Administered 2021-04-04 – 2021-04-09 (×10): 2 via NASAL
  Filled 2021-04-03: qty 30

## 2021-04-03 MED ORDER — CALCIUM CARBONATE ANTACID 500 MG PO CHEW: 2.0000 | CHEWABLE_TABLET | Freq: Four times a day (QID) | ORAL | Status: DC | PRN

## 2021-04-03 MED ORDER — ATORVASTATIN CALCIUM 20 MG PO TABS
20.0000 mg | ORAL_TABLET | Freq: Every day | ORAL | Status: DC
  Administered 2021-04-04 – 2021-04-08 (×5): 20 mg via ORAL
  Filled 2021-04-03 (×5): qty 1

## 2021-04-03 MED ORDER — BISACODYL 10 MG RE SUPP
10.0000 mg | Freq: Every day | RECTAL | Status: DC | PRN
  Filled 2021-04-03: qty 1

## 2021-04-03 MED ORDER — ACETAMINOPHEN 650 MG RE SUPP: 650.0000 mg | Freq: Four times a day (QID) | RECTAL | Status: DC | PRN

## 2021-04-03 MED ORDER — POLYVINYL ALCOHOL 1.4 % OP SOLN
2.0000 [drp] | Freq: Three times a day (TID) | OPHTHALMIC | Status: DC
  Administered 2021-04-04 – 2021-04-09 (×16): 2 [drp] via OPHTHALMIC
  Filled 2021-04-03: qty 15

## 2021-04-03 MED ORDER — POLYETHYLENE GLYCOL 3350 17 G PO PACK
17.0000 g | PACK | Freq: Every day | ORAL | Status: DC
  Administered 2021-04-05 – 2021-04-08 (×4): 17 g via ORAL
  Filled 2021-04-03 (×4): qty 1

## 2021-04-03 MED ORDER — VANCOMYCIN HCL IN DEXTROSE 1-5 GM/200ML-% IV SOLN
1000.0000 mg | Freq: Once | INTRAVENOUS | Status: AC
Start: 2021-04-03 — End: 2021-04-03
  Administered 2021-04-03: 1000 mg via INTRAVENOUS
  Filled 2021-04-03: qty 200

## 2021-04-03 MED ORDER — TRIAMCINOLONE ACETONIDE 0.1 % EX CREA
1.0000 "application " | TOPICAL_CREAM | Freq: Two times a day (BID) | CUTANEOUS | Status: DC
  Administered 2021-04-05 – 2021-04-09 (×9): 1 via TOPICAL
  Filled 2021-04-03: qty 15

## 2021-04-03 MED ORDER — ESCITALOPRAM OXALATE 10 MG PO TABS
5.0000 mg | ORAL_TABLET | Freq: Every day | ORAL | Status: DC
  Administered 2021-04-05 – 2021-04-09 (×5): 5 mg via ORAL
  Filled 2021-04-03 (×5): qty 0.5

## 2021-04-03 MED ORDER — MUSCLE RUB 10-15 % EX CREA
1.0000 "application " | TOPICAL_CREAM | Freq: Three times a day (TID) | CUTANEOUS | Status: DC
  Administered 2021-04-04 – 2021-04-09 (×13): 1 via TOPICAL
  Filled 2021-04-03: qty 85

## 2021-04-03 MED ORDER — PANTOPRAZOLE SODIUM 40 MG PO TBEC
40.0000 mg | DELAYED_RELEASE_TABLET | Freq: Two times a day (BID) | ORAL | Status: DC
  Administered 2021-04-04 – 2021-04-09 (×10): 40 mg via ORAL
  Filled 2021-04-03 (×10): qty 1

## 2021-04-03 MED ORDER — SODIUM CHLORIDE 0.9 % IV SOLN: 2.0000 g | Freq: Once | INTRAVENOUS | Status: DC

## 2021-04-03 MED ORDER — METOPROLOL TARTRATE 25 MG PO TABS
25.0000 mg | ORAL_TABLET | Freq: Three times a day (TID) | ORAL | Status: DC
  Administered 2021-04-04 – 2021-04-06 (×5): 25 mg via ORAL
  Filled 2021-04-03 (×7): qty 1

## 2021-04-03 MED ORDER — VANCOMYCIN VARIABLE DOSE PER UNSTABLE RENAL FUNCTION (PHARMACIST DOSING): Status: DC

## 2021-04-03 MED ORDER — FINASTERIDE 5 MG PO TABS
5.0000 mg | ORAL_TABLET | Freq: Every day | ORAL | Status: DC
  Administered 2021-04-05 – 2021-04-09 (×5): 5 mg via ORAL
  Filled 2021-04-03 (×6): qty 1

## 2021-04-03 MED ORDER — TRAZODONE HCL 50 MG PO TABS: 25.0000 mg | ORAL_TABLET | Freq: Every evening | ORAL | Status: DC | PRN

## 2021-04-03 MED ORDER — AMIODARONE HCL 200 MG PO TABS: 200.0000 mg | ORAL_TABLET | ORAL | Status: DC

## 2021-04-03 MED ORDER — SODIUM CHLORIDE 0.9 % IV SOLN
2.0000 g | Freq: Every day | INTRAVENOUS | Status: AC
  Administered 2021-04-03 – 2021-04-07 (×5): 2 g via INTRAVENOUS
  Filled 2021-04-03: qty 2
  Filled 2021-04-03: qty 20
  Filled 2021-04-03: qty 2
  Filled 2021-04-03 (×3): qty 20

## 2021-04-03 MED ORDER — ACETAMINOPHEN 325 MG PO TABS
650.0000 mg | ORAL_TABLET | Freq: Four times a day (QID) | ORAL | Status: DC | PRN
  Administered 2021-04-06 – 2021-04-08 (×2): 650 mg via ORAL
  Filled 2021-04-03 (×3): qty 2

## 2021-04-03 MED ORDER — MAGNESIUM HYDROXIDE 400 MG/5ML PO SUSP: 30.0000 mL | Freq: Every day | ORAL | Status: DC | PRN

## 2021-04-03 MED ORDER — METHOCARBAMOL 500 MG PO TABS: 1000.0000 mg | ORAL_TABLET | Freq: Three times a day (TID) | ORAL | Status: DC | PRN

## 2021-04-03 MED ORDER — MINERAL OIL RE ENEM: 1.0000 | ENEMA | Freq: Every day | RECTAL | Status: DC

## 2021-04-03 MED ORDER — LACTASE 3000 UNITS PO TABS
3000.0000 [IU] | ORAL_TABLET | Freq: Three times a day (TID) | ORAL | Status: DC
  Administered 2021-04-04 – 2021-04-09 (×14): 3000 [IU] via ORAL
  Filled 2021-04-03 (×19): qty 1

## 2021-04-03 MED ORDER — IPRATROPIUM-ALBUTEROL 0.5-2.5 (3) MG/3ML IN SOLN
3.0000 mL | RESPIRATORY_TRACT | Status: DC | PRN
  Administered 2021-04-04 (×2): 3 mL via RESPIRATORY_TRACT
  Filled 2021-04-03 (×2): qty 3

## 2021-04-03 MED ORDER — APIXABAN 5 MG PO TABS
5.0000 mg | ORAL_TABLET | Freq: Two times a day (BID) | ORAL | Status: DC
  Administered 2021-04-04 – 2021-04-09 (×10): 5 mg via ORAL
  Filled 2021-04-03 (×10): qty 1

## 2021-04-03 MED ORDER — PSYLLIUM 95 % PO PACK
1.0000 | PACK | Freq: Every day | ORAL | Status: DC
  Administered 2021-04-05 – 2021-04-07 (×3): 1 via ORAL
  Filled 2021-04-03 (×4): qty 1

## 2021-04-03 MED ORDER — PREDNISOLONE ACETATE 1 % OP SUSP
1.0000 [drp] | Freq: Four times a day (QID) | OPHTHALMIC | Status: DC
  Administered 2021-04-04 – 2021-04-09 (×20): 1 [drp] via OPHTHALMIC
  Filled 2021-04-03: qty 5

## 2021-04-03 MED ORDER — CARBIDOPA-LEVODOPA 25-100 MG PO TABS
1.0000 | ORAL_TABLET | Freq: Three times a day (TID) | ORAL | Status: DC
  Administered 2021-04-04 – 2021-04-09 (×15): 1 via ORAL
  Filled 2021-04-03 (×20): qty 1

## 2021-04-03 MED ORDER — HYDROCOD POLST-CPM POLST ER 10-8 MG/5ML PO SUER
5.0000 mL | Freq: Two times a day (BID) | ORAL | Status: DC | PRN
  Administered 2021-04-08: 5 mL via ORAL
  Filled 2021-04-03: qty 5

## 2021-04-03 MED ORDER — SODIUM CHLORIDE 0.9 % IV SOLN
500.0000 mg | INTRAVENOUS | Status: DC
  Administered 2021-04-04 – 2021-04-06 (×3): 500 mg via INTRAVENOUS
  Filled 2021-04-03 (×5): qty 5

## 2021-04-03 MED ORDER — SODIUM CHLORIDE 0.9 % IV SOLN: 2.0000 g | INTRAVENOUS | Status: DC

## 2021-04-03 MED ORDER — ONDANSETRON HCL 4 MG/2ML IJ SOLN: 4.0000 mg | Freq: Four times a day (QID) | INTRAMUSCULAR | Status: DC | PRN

## 2021-04-03 MED ORDER — GABAPENTIN 800 MG PO TABS: 800.0000 mg | ORAL_TABLET | Freq: Two times a day (BID) | ORAL | Status: DC

## 2021-04-03 MED ORDER — VANCOMYCIN HCL 1250 MG/250ML IV SOLN
1250.0000 mg | Freq: Once | INTRAVENOUS | Status: AC
Start: 2021-04-03 — End: 2021-04-04
  Administered 2021-04-04: 1250 mg via INTRAVENOUS
  Filled 2021-04-03: qty 250

## 2021-04-03 MED ORDER — HYDROXYZINE HCL 25 MG PO TABS: 25.0000 mg | ORAL_TABLET | Freq: Two times a day (BID) | ORAL | Status: DC | PRN

## 2021-04-03 MED ORDER — GUAIFENESIN ER 600 MG PO TB12: 600.0000 mg | ORAL_TABLET | Freq: Two times a day (BID) | ORAL | Status: DC

## 2021-04-03 MED ORDER — GLUCERNA SHAKE PO LIQD
237.0000 mL | Freq: Two times a day (BID) | ORAL | Status: DC
  Administered 2021-04-05 – 2021-04-09 (×8): 237 mL via ORAL

## 2021-04-03 NOTE — Progress Notes (Signed)
CODE SEPSIS - PHARMACY COMMUNICATION  **Broad Spectrum Antibiotics should be administered within 1 hour of Sepsis diagnosis**  Time Code Sepsis Called/Page Received: 2129  Antibiotics Ordered: cefepime/vancomycin  Time of 1st antibiotic administration: 2057   Pricilla Riffle, PharmD, BCPS Clinical Pharmacist 04/03/2021 10:01 PM

## 2021-04-03 NOTE — ED Notes (Signed)
Critical lactic acid 2.8 per lab- edp notified

## 2021-04-03 NOTE — ED Notes (Signed)
Unable to collect covid resp panel swab due to bipap

## 2021-04-03 NOTE — ED Provider Notes (Signed)
[facility] Provider Note    Event Date/Time   First MD Initiated Contact with Patient 04/03/21 2138     (approximate)   History   Shortness of Breath   HPI  Raymond Hays is a 76 y.o. male who, according to hospital discharge summary dated 03/16/21 has history of COPD, DM, HTN, HLD, afib and was hospitalized with UTI, AKI and afib with rvr, presents to the emergency department today because of concerns for altered mental status.  Unfortunately patient cannot give any history.  Per report staff at living facility noticed that throughout the day today he became increasingly altered.  EMS states that when they arrived they noted patient to be hypoxic.  They did place the patient on nonrebreather which improved the oxygen level of the patient.     Physical Exam   Triage Vital Signs: ED Triage Vitals  Enc Vitals Group     BP 04/03/21 1946 (!) 122/110     Pulse Rate 04/03/21 1946 (!) 115     Resp 04/03/21 1946 (!) 40     Temp 04/03/21 2030 (!) 101.2 F (38.4 C)     Temp Source 04/03/21 2030 Axillary     SpO2 04/03/21 1942 93 %     Weight 04/03/21 1947 224 lb 13.9 oz (102 kg)     Height 04/03/21 1947 5\' 8"  (1.727 m)   Most recent vital signs: Vitals:   04/03/21 2100 04/03/21 2130  BP: 125/88 108/78  Pulse: (!) 135 (!) 125  Resp: (!) 28 (!) 31  Temp:    SpO2: 100% 100%    General: Awake, minimally alert. Non verbal. CV:  Good peripheral perfusion. Tachycardic. Resp:  Increased respiratory effort. Rhonchi at bases. Abd:  No distention. Non tender.    ED Results / Procedures / Treatments   Labs (all labs ordered are listed, but only abnormal results are displayed) Lactic acid 2.8 Trop 35 BMP na 133, k 5.5, cr 2.64 CBC wbc 9.1, hgb 13.9, plt 398  EKG  I, Nance Pear, attending physician, personally viewed and interpreted this EKG  EKG Time: 1951 Rate: 133 Rhythm: atrial fibrillation Axis: left axis deviation Intervals: qtc 411 QRS:  narrow, q waves III, aVF ST changes: no st elevation Impression: abnormal ekg  RADIOLOGY CXR My interpretation: Left upper airspace disease Radiology interpretation:    IMPRESSION:  1. Ill-defined left suprahilar opacity, not seen on prior exam. This  may be due to overlapping vascular structures, atelectasis, or  pneumonia in the appropriate clinical setting.  2. Low lung volumes. Cardiomegaly likely accentuated by a  hypoaeration.        PROCEDURES:  Critical Care performed: Yes, see critical care procedure note(s)  Procedures  CRITICAL CARE Performed by: Nance Pear   Total critical care time: 35 minutes  Critical care time was exclusive of separately billable procedures and treating other patients.  Critical care was necessary to treat or prevent imminent or life-threatening deterioration.  Critical care was time spent personally by me on the following activities: development of treatment plan with patient and/or surrogate as well as nursing, discussions with consultants, evaluation of patient's response to treatment, examination of patient, obtaining history from patient or surrogate, ordering and performing treatments and interventions, ordering and review of laboratory studies, ordering and review of radiographic studies, pulse oximetry and re-evaluation of patient's condition.     IMPRESSION / MDM / ASSESSMENT AND PLAN / ED COURSE  I reviewed the triage vital signs and the  nursing notes.                              Differential diagnosis includes, but is not limited to, CHF, pneumonia, covid, flu, heart attack, drug induced AMS.   Patient presented to the emergency department today because of concern for altered mental status and shortness of breath found by EMS. Patient was placed on bipap on arrival to the emergency department. Was found to be febrile. CXR was concerning for pneumonia. This could explain the fever and shortness of breath. Patient was  started on broad spectrum antibiotics. The patient was also found to have elevated lactic acid, which could be related to the infection, also would have concern for dehydration given elevated creatinine. Patient given IVFs. Had a discussion with hospitalists about admission. Also discussed the patient's condition with his son.    FINAL CLINICAL IMPRESSION(S) / ED DIAGNOSES   Final diagnoses:  Pneumonia due to infectious organism, unspecified laterality, unspecified part of lung  Altered mental status, unspecified altered mental status type     Note:  This document was prepared using Dragon voice recognition software and may include unintentional dictation errors.    Nance Pear, MD 04/03/21 2251

## 2021-04-03 NOTE — ED Notes (Signed)
Called pharmacy- out of cefepime in pyxis- will send up stat

## 2021-04-03 NOTE — H&P (Addendum)
[place]   PATIENT NAME: Raymond Hays    MR#:  [id number]  DATE OF BIRTH:  December 04, 1945  DATE OF ADMISSION:  04/03/2021  PRIMARY CARE PHYSICIAN: [facility]   Patient is coming from: ALF  REQUESTING/REFERRING PHYSICIAN: Phineas Semen, MD  CHIEF COMPLAINT:   Chief Complaint  Patient presents with   Shortness of Breath    HISTORY OF PRESENT ILLNESS:  Raymond Hays is a 76 y.o. Caucasian male with medical history significant for COPD, type 2 diabetes mellitus, hypertension, Parkinson's disease, paroxysmal atrial fibrillation and spinal stenosis, who presented to the ER with acute onset of altered mental status with significantly diminished responsiveness.  The patient was noted at his assisted living facility to be minimally responsive to painful stimuli with hypotension, fever and hypoxia.  This has been worsening throughout the day.  He was placed on 100% nonrebreather with improved oxygenation.  ED Course: On presentation to the emergency room heart rate was 115 with a respiratory rate of 40 and a temperature of one 1.2 with a pulse oximetry of 98 200% on BiPAP.  ABG showed pH 7.29 and HCO3 of 23.1 with a PCO2 of 48 and PO2 39 with O2 sat of 6 6.2% on 70% FiO2.  Labs revealed hyponatremia and hypochloremia with hyperkalemia and a BUN of 47 with a creatinine of 2.64 from previous normal levels on 03/24/2021 high-sensitivity troponin I was 35 and lactic acid was 2.8.  CBC was within normal.  Blood cultures were drawn. EKG as reviewed by me : EKG showed atrial relation with rapid ventricular sponsor 133 with PVCs RSR-and Q waves inferiorly Imaging: Portable chest ray showed cardiomegaly and low lung volumes and ill-defined left suprahilar opacity not seen on previous exam.  We will  The patient was given 2 L bolus of IV lactated Ringer, IV cefepime and vancomycin.  He will be admitted to a stepdown unit bed for further evaluation and management. PAST MEDICAL HISTORY:   Past  Medical History:  Diagnosis Date   Allergic rhinitis    Anxiety    Arthritis    Chronic indwelling Foley catheter    COPD (chronic obstructive pulmonary disease) (HCC)    Cough    Diabetes (HCC)    Essential (primary) hypertension    Hemorrhoid    Hyperlipidemia    PAF (paroxysmal atrial fibrillation) (HCC)    Primary osteoarthritis, unspecified shoulder    Retention of urine, unspecified    Spinal stenosis    UTI (urinary tract infection)     PAST SURGICAL HISTORY:   Past Surgical History:  Procedure Laterality Date   APPENDECTOMY     BACK SURGERY     COLONOSCOPY N/A 10/27/2020   Procedure: COLONOSCOPY;  Surgeon: Wyline Mood, MD;  Location: [facility] ENDOSCOPY;  Service: Gastroenterology;  Laterality: N/A;   ELBOW SURGERY     ESOPHAGOGASTRODUODENOSCOPY (EGD) WITH PROPOFOL N/A 10/27/2020   Procedure: ESOPHAGOGASTRODUODENOSCOPY (EGD) WITH PROPOFOL;  Surgeon: Wyline Mood, MD;  Location: [facility] ENDOSCOPY;  Service: Gastroenterology;  Laterality: N/A;   HERNIA REPAIR     KNEE SURGERY     neck     PARTIAL HIP ARTHROPLASTY Bilateral    TEE WITHOUT CARDIOVERSION N/A 10/19/2020   Procedure: TRANSESOPHAGEAL ECHOCARDIOGRAM (TEE);  Surgeon: Antonieta Iba, MD;  Location: [facility] ORS;  Service: Cardiovascular;  Laterality: N/A;   TONSILLECTOMY      SOCIAL HISTORY:   Social History   Tobacco Use   Smoking status: Former    Packs/day:  0.50    Years: 10.00    Pack years: 5.00    Types: Cigarettes    Quit date: 2006    Years since quitting: 17.0   Smokeless tobacco: Never  Substance Use Topics   Alcohol use: Not Currently    FAMILY HISTORY:   Family History  Problem Relation Age of Onset   Healthy Son    Healthy Son     DRUG ALLERGIES:  No Known Allergies  REVIEW OF SYSTEMS:   ROS As per history of present illness. All pertinent systems were reviewed above. Constitutional, HEENT, cardiovascular, respiratory, GI, GU, musculoskeletal, neuro,  psychiatric, endocrine, integumentary and hematologic systems were reviewed and are otherwise negative/unremarkable except for positive findings mentioned above in the HPI.   MEDICATIONS AT HOME:   Prior to Admission medications   Medication Sig Start Date End Date Taking? Authorizing Provider  amiodarone (PACERONE) 400 MG tablet Take 1 tablet (400 mg total) by mouth 2 (two) times daily for 3 days, THEN 0.5 tablets (200 mg total) 2 (two) times daily for 7 days, THEN 0.5 tablets (200 mg total) daily. 03/26/21 05/05/21 Yes Sreenath, Sudheer B, MD  apixaban (ELIQUIS) 5 MG TABS tablet Take 5 mg by mouth 2 (two) times daily.  02/06/17  Yes [provider]  atorvastatin (LIPITOR) 20 MG tablet Take 20 mg by mouth at bedtime.   Yes [provider]  carbidopa-levodopa (SINEMET IR) 25-100 MG tablet Take 1 tablet by mouth 3 (three) times daily before meals.   Yes [provider]  escitalopram (LEXAPRO) 5 MG tablet Take 5 mg by mouth daily.   Yes [provider]  finasteride (PROSCAR) 5 MG tablet Take 5 mg by mouth daily.   Yes [provider]  fluticasone (FLONASE) 50 MCG/ACT nasal spray Place 1 spray into both nostrils daily.   Yes [provider]  gabapentin (NEURONTIN) 800 MG tablet Take 800 mg by mouth 2 (two) times daily.    Yes [provider]  ipratropium (ATROVENT) 0.03 % nasal spray Place 2 sprays into both nostrils 2 (two) times daily. 02/18/20  Yes [provider]  lactase (LACTAID) 3000 units tablet Take 3,000 Units by mouth 3 (three) times daily with meals.   Yes [provider]  Menthol, Topical Analgesic, 4 % GEL Apply 1 application topically 3 (three) times daily. (Apply to neck)   Yes [provider]  metFORMIN (GLUCOPHAGE-XR) 500 MG 24 hr tablet Take 500 mg by mouth daily with supper.   Yes [provider]  methocarbamol (ROBAXIN) 500 MG tablet Take 1,000 mg by mouth in the morning and at  bedtime.    Yes [provider]  metoprolol tartrate (LOPRESSOR) 25 MG tablet Take 1 tablet (25 mg total) by mouth 3 (three) times daily. 03/26/21  Yes Sreenath, Sudheer B, MD  oxybutynin (DITROPAN-XL) 10 MG 24 hr tablet Take 1 tablet (10 mg total) by mouth daily. 11/01/20  Yes [name], MD  pantoprazole (PROTONIX) 40 MG tablet Take 1 tablet (40 mg total) by mouth 2 (two) times daily before a meal. For 8 weeks. Patient taking differently: Take 40 mg by mouth 2 (two) times daily before a meal. 11/01/20  Yes [name], MD  polyethylene glycol (MIRALAX / GLYCOLAX) 17 g packet Take 17 g by mouth daily.   Yes [provider]  prednisoLONE acetate (PRED FORTE) 1 % ophthalmic suspension Place 1 drop into both eyes 4 (four) times daily. 03/26/21  Yes Tresa Moore, MD  Propylene Glycol (SYSTANE BALANCE) 0.6 % SOLN Place 2 drops into both eyes in the morning, at noon, and at bedtime.   Yes [provider]  psyllium (METAMUCIL) 58.6 % powder Take 1 packet by mouth daily.   Yes [provider]  selenium sulfide (SELSUN) 1 % LOTN Apply 1 application topically 2 (two) times a week. (Tuesday and Friday)   Yes [provider]  TRELEGY ELLIPTA 100-62.5-25 MCG/INH AEPB Inhale 1 puff into the lungs daily. 02/17/20  Yes [provider]  triamcinolone cream (KENALOG) 0.1 % Apply 1 application topically 2 (two) times daily.   Yes [provider]  acetaminophen (TYLENOL) 325 MG tablet Take 650 mg by mouth every 4 (four) hours as needed for mild pain.    [provider]  Albuterol Sulfate (PROAIR RESPICLICK) 108 (90 Base) MCG/ACT AEPB Inhale 2 puffs into the lungs every 4 (four) hours as needed (COPD).    [provider]  bisacodyl (DULCOLAX) 10 MG suppository Place 10 mg rectally daily as needed for moderate constipation.    [provider]  calcium carbonate (TUMS - DOSED IN MG ELEMENTAL CALCIUM) 500 MG chewable tablet Chew 2  tablets by mouth every 6 (six) hours as needed for indigestion or heartburn.    [provider]  Emollient (CETAPHIL) cream Apply 1 application topically 2 (two) times daily as needed (dry skin). (Apply to entire body)    [provider]  feeding supplement, GLUCERNA SHAKE, (GLUCERNA SHAKE) LIQD Take 237 mLs by mouth 2 (two) times daily between meals. 11/01/20   Darlin PriestlyLai, Tina, MD  guaiFENesin (MUCINEX) 600 MG 12 hr tablet Take 1 tablet (600 mg total) by mouth 2 (two) times daily. Patient not taking: Reported on 04/03/2021 01/22/21   Tresa MooreSreenath, Sudheer B, MD  hydrOXYzine (ATARAX/VISTARIL) 25 MG tablet Take 25 mg by mouth 2 (two) times daily as needed for itching. 02/17/20   [provider]  ipratropium-albuterol (DUONEB) 0.5-2.5 (3) MG/3ML SOLN Inhale 3 mLs into the lungs daily.    [provider]  mineral oil enema Place 133 mLs (1 enema total) rectally daily. 02/03/21   Zigmund DanielPowell, A Caldwell Jr., MD      VITAL SIGNS:  Blood pressure 108/78, pulse (!) 125, temperature (!) 101.2 F (38.4 C), temperature source Axillary, resp. rate (!) 31, height 5\' 8"  (1.727 m), weight 102 kg, SpO2 100 %.  PHYSICAL EXAMINATION:  Physical Exam  GENERAL: Acutely ill 76 y.o.-year-old Caucasian male patient lying in the bed on BiPAP with minimal responsiveness to painful stimuli. EYES: Pupils equal, round, reactive to light and accommodation. No scleral icterus. Extraocular muscles intact.  HEENT: Head atraumatic, normocephalic. Oropharynx and nasopharynx clear.  NECK:  Supple, no jugular venous distention. No thyroid enlargement, no tenderness.  LUNGS: Diminished bibasilar breath sounds with left midlung zone crackles.  No use of accessory muscles of respiration.  CARDIOVASCULAR: Regular rate and rhythm, S1, S2 normal. No murmurs, rubs, or gallops.  ABDOMEN: Soft, nondistended, nontender. Bowel sounds present. No organomegaly or mass.  EXTREMITIES: 1-2+ bilateral lower extremity pitting  edema, with no cyanosis, or clubbing. Musculoskeletal: Bilateral, right more than left knee contusions. NEUROLOGIC: Cranial nerves II through XII are intact. Muscle strength 5/5 in all extremities. Sensation intact. Gait not checked.  PSYCHIATRIC: The patient is alert and oriented x 3.  Normal affect and good eye contact. SKIN: No obvious rash, lesion, or ulcer.   LABORATORY PANEL:   CBC Recent Labs  Lab 04/03/21 1953  WBC 9.1  HGB 13.9  HCT 43.1  PLT 398   ------------------------------------------------------------------------------------------------------------------  Chemistries  Recent Labs  Lab 04/03/21 1953  NA 133*  K 5.5*  CL 97*  CO2 23  GLUCOSE 148*  BUN 47*  CREATININE 2.64*  CALCIUM 9.2   ------------------------------------------------------------------------------------------------------------------  Cardiac Enzymes No results for input(s): TROPONINI in the last 168 hours. ------------------------------------------------------------------------------------------------------------------  RADIOLOGY:  DG Chest Port 1 View  Result Date: 04/03/2021 CLINICAL DATA:  Shortness of breath. EXAM: PORTABLE CHEST 1 VIEW COMPARISON:  03/22/2021, chest CT 01/09/2021 FINDINGS: Lower lung volumes from prior exam limiting assessment. Cardiomegaly likely accentuated by portable technique. Surgical clips project over the left heart. Stable mediastinal contours with aortic atherosclerosis. Ill-defined left suprahilar opacity, not seen on prior exam. No pneumothorax or large pleural effusion. No evident pulmonary edema. No acute osseous abnormalities are seen. Gaseous distention of bowel in the upper abdomen. IMPRESSION: 1. Ill-defined left suprahilar opacity, not seen on prior exam. This may be due to overlapping vascular structures, atelectasis, or pneumonia in the appropriate clinical setting. 2. Low lung volumes. Cardiomegaly likely accentuated by a hypoaeration. Electronically  Signed   By: Narda Rutherford M.D.   On: 04/03/2021 20:11      IMPRESSION AND PLAN:  Principal Problem:   Acute respiratory failure (HCC)  1.  Acute hypoxic respiratory failure like secondary to underlying left-sided Community-acquired pneumonia. - The patient will be admitted to a stepdown unit bed. - We will continue him on BiPAP to be tapered off as tolerated. - We will continue antibiotic therapy with IV Rocephin and Zithromax. - Mucolytic therapy and bronchodilator therapy will be provided. - We will follow sputum and blood cultures. - We will follow pneumonia antigens.  2.  Paroxysmal atrial fibrillation with rapid ventricular response. - We will continue amiodarone and Eliquis. - The patient will be hydrated with IV normal saline.  3.  Parkinson's disease. - We will continue Sinemet IR.  4.  Depression. - We will continue Lexapro.  5.  BPH. - We will continue Proscar.  6.  Dyslipidemia. - We will continue statin therapy.  7.  Type 2 diabetes mellitus with peripheral neuropathy. - The patient will be placed on supplement coverage with NovoLog. - We will continue Neurontin. - We will hold off Glucophage XR.  8.  COPD without exacerbation. - We will place him on scheduled and as needed DuoNebs.  DVT prophylaxis: Lovenox.  Code Status: DNR/DNI.  He has an out of facility DNR form. Family Communication:  The plan of care was discussed in details with the patient's son) earlier and his questions were answered.  He agreed to proceed with the above mentioned plan. Further management will depend upon hospital course. Disposition Plan: Back to previous home environment Consults called: none.  All the records are reviewed and case discussed with ED provider.  Status is: Inpatient   At the time of the admission, it appears that the appropriate admission status for this patient is inpatient.  This is judged to be reasonable and necessary in order to provide the required  intensity of service to ensure the patient's safety given the presenting symptoms, physical exam findings and initial radiographic and laboratory data in the context of comorbid conditions.  The patient requires inpatient status due to high intensity of service, high risk of further deterioration and high frequency of surveillance required.  I certify that at the time of admission, it is my clinical judgment that the patient will require inpatient hospital care extending more than  2 midnights.                            Dispo: The patient is from: Home              Anticipated d/c is to: Home              Patient currently is not medically stable to d/c.              Difficult to place patient: No  Authorized and performed by: Valente DavidJan Cliffard Hair, MD Total critical care time: Approximately   55    minutes. Due to a high probability of clinically significant, life-threatening deterioration, the patient required my highest level of preparedness to intervene emergently and I personally spent this critical care time directly and personally managing the patient.  This critical care time included obtaining a history, examining the patient, pulse oximetry, ordering and review of studies, arranging urgent treatment with development of management plan, evaluation of patient's response to treatment, frequent reassessment, and discussions with other providers. This critical care time was performed to assess and manage the high probability of imminent, life-threatening deterioration that could result in multiorgan failure.  It was exclusive of separately billable procedures and treating other patients and teaching time.     Hannah BeatJan A Kataya Guimont M.D on 04/03/2021 at 10:20 PM  [facility]   From 7 PM-7 AM, contact night-coverage [email or web address]  CC: Primary care physician; [facility]

## 2021-04-03 NOTE — ED Notes (Signed)
Only unable to obtain one set of blood cultures due to difficult IV and vascular access

## 2021-04-03 NOTE — Consult Note (Signed)
Pharmacy Antibiotic Note  Raymond Hays is a 76 y.o. male admitted on 04/03/2021 with pneumonia.  Pharmacy has been consulted for Vancomycin dosing.  Plan: Vancomycin 1000 mg + 1250 mg = 2250 mg LD x 1 Dose by levels due to AKI Follow up cultures  Height: 5\' 8"  (172.7 cm) Weight: 102 kg (224 lb 13.9 oz) IBW/kg (Calculated) : 68.4  Temp (24hrs), Avg:101.2 F (38.4 C), Min:101.2 F (38.4 C), Max:101.2 F (38.4 C)  Recent Labs  Lab 04/03/21 1953 04/03/21 2030  WBC 9.1  --   CREATININE 2.64*  --   LATICACIDVEN  --  2.8*    Estimated Creatinine Clearance: 27.1 mL/min (A) (by C-G formula based on SCr of 2.64 mg/dL (H)).    No Known Allergies  Antimicrobials this admission: 04/03/21 ceftriaxone >>  1/3 Vancomycin >>    Dose adjustments this admission: N/a  Microbiology results: 1/3 BCx: sent 1/3 Sputum: sent  1/3 MRSA PCR: ordered  Thank you for allowing pharmacy to be a part of this patients care.  06/01/21, PharmD, BCPS Clinical Pharmacist   04/03/2021 10:39 PM

## 2021-04-03 NOTE — Sepsis Progress Note (Signed)
Elink following Code Sepsis. 

## 2021-04-03 NOTE — ED Triage Notes (Addendum)
Presents from [facility] for resp distress via EMS. At facility, pt was reported to be sleeping all day and lethargic. Patient normally talks and is alert. Patient was diagnosed with Covid. Patient has hx: COPD and is normally on 2L/hr O2 via nasal cannula. EMS original sats at presentation were 74% on 2L. On non rebreather sats improved from 90-95%. Pt also has hx: AFIB and HR was between 120-140 bpm. On presentation patient mildly alert and unable to talk to staff to answer questions.

## 2021-04-04 ENCOUNTER — Encounter: Payer: Self-pay | Admitting: Family Medicine

## 2021-04-04 ENCOUNTER — Other Ambulatory Visit: Payer: Self-pay

## 2021-04-04 LAB — CBC
HCT: 34.9 % — ABNORMAL LOW (ref 39.0–52.0)
Hemoglobin: 11.4 g/dL — ABNORMAL LOW (ref 13.0–17.0)
MCH: 31.4 pg (ref 26.0–34.0)
MCHC: 32.7 g/dL (ref 30.0–36.0)
MCV: 96.1 fL (ref 80.0–100.0)
Platelets: 267 10*3/uL (ref 150–400)
RBC: 3.63 MIL/uL — ABNORMAL LOW (ref 4.22–5.81)
RDW: 16.8 % — ABNORMAL HIGH (ref 11.5–15.5)
WBC: 8.4 10*3/uL (ref 4.0–10.5)
nRBC: 0.2 % (ref 0.0–0.2)

## 2021-04-04 LAB — BASIC METABOLIC PANEL
Anion gap: 13 (ref 5–15)
BUN: 46 mg/dL — ABNORMAL HIGH (ref 8–23)
CO2: 18 mmol/L — ABNORMAL LOW (ref 22–32)
Calcium: 8.2 mg/dL — ABNORMAL LOW (ref 8.9–10.3)
Chloride: 101 mmol/L (ref 98–111)
Creatinine, Ser: 2.08 mg/dL — ABNORMAL HIGH (ref 0.61–1.24)
GFR, Estimated: 32 mL/min — ABNORMAL LOW (ref >60)
Glucose, Bld: 116 mg/dL — ABNORMAL HIGH (ref 70–99)
Potassium: 4.1 mmol/L (ref 3.5–5.1)
Sodium: 132 mmol/L — ABNORMAL LOW (ref 135–145)

## 2021-04-04 LAB — URINALYSIS, COMPLETE (UACMP) WITH MICROSCOPIC
Glucose, UA: NEGATIVE mg/dL
Ketones, ur: 5 mg/dL — AB
Nitrite: NEGATIVE
Protein, ur: 100 mg/dL — AB
RBC / HPF: >50 RBC/hpf — ABNORMAL HIGH (ref 0–5)
Specific Gravity, Urine: 1.024 (ref 1.005–1.030)
WBC, UA: >50 WBC/hpf — ABNORMAL HIGH (ref 0–5)
pH: 5 (ref 5.0–8.0)

## 2021-04-04 LAB — LACTIC ACID, PLASMA: Lactic Acid, Venous: 1.6 mmol/L (ref 0.5–1.9)

## 2021-04-04 LAB — TROPONIN I (HIGH SENSITIVITY): Troponin I (High Sensitivity): 27 ng/L — ABNORMAL HIGH (ref <18)

## 2021-04-04 LAB — CBG MONITORING, ED
Glucose-Capillary: 131 mg/dL — ABNORMAL HIGH (ref 70–99)
Glucose-Capillary: 114 mg/dL — ABNORMAL HIGH (ref 70–99)
Glucose-Capillary: 96 mg/dL (ref 70–99)

## 2021-04-04 LAB — STREP PNEUMONIAE URINARY ANTIGEN: Strep Pneumo Urinary Antigen: NEGATIVE

## 2021-04-04 LAB — RESP PANEL BY RT-PCR (FLU A&B, COVID) ARPGX2
Influenza A by PCR: NEGATIVE
Influenza B by PCR: NEGATIVE
SARS Coronavirus 2 by RT PCR: NEGATIVE

## 2021-04-04 LAB — PROCALCITONIN: Procalcitonin: 0.8 ng/mL

## 2021-04-04 MED ORDER — IPRATROPIUM-ALBUTEROL 0.5-2.5 (3) MG/3ML IN SOLN: 3.0000 mL | Freq: Four times a day (QID) | RESPIRATORY_TRACT | Status: DC

## 2021-04-04 MED ORDER — METHOCARBAMOL 500 MG PO TABS
1000.0000 mg | ORAL_TABLET | Freq: Two times a day (BID) | ORAL | Status: DC
  Administered 2021-04-04 – 2021-04-09 (×9): 1000 mg via ORAL
  Filled 2021-04-04 (×12): qty 2

## 2021-04-04 MED ORDER — VANCOMYCIN HCL 1500 MG/300ML IV SOLN: 1500.0000 mg | INTRAVENOUS | Status: DC

## 2021-04-04 MED ORDER — AMIODARONE HCL 200 MG PO TABS
200.0000 mg | ORAL_TABLET | Freq: Two times a day (BID) | ORAL | Status: AC
  Administered 2021-04-04: 200 mg via ORAL
  Filled 2021-04-04: qty 1

## 2021-04-04 MED ORDER — FLUTICASONE FUROATE-VILANTEROL 100-25 MCG/ACT IN AEPB
1.0000 | INHALATION_SPRAY | Freq: Every day | RESPIRATORY_TRACT | Status: DC
  Administered 2021-04-05 – 2021-04-09 (×5): 1 via RESPIRATORY_TRACT
  Filled 2021-04-04: qty 28

## 2021-04-04 MED ORDER — UMECLIDINIUM BROMIDE 62.5 MCG/ACT IN AEPB
1.0000 | INHALATION_SPRAY | Freq: Every day | RESPIRATORY_TRACT | Status: DC
  Administered 2021-04-05 – 2021-04-09 (×5): 1 via RESPIRATORY_TRACT
  Filled 2021-04-04: qty 7

## 2021-04-04 MED ORDER — GABAPENTIN 300 MG PO CAPS
300.0000 mg | ORAL_CAPSULE | Freq: Two times a day (BID) | ORAL | Status: DC
  Administered 2021-04-04 – 2021-04-09 (×10): 300 mg via ORAL
  Filled 2021-04-04 (×10): qty 1

## 2021-04-04 MED ORDER — FLUTICASONE-UMECLIDIN-VILANT 100-62.5-25 MCG/ACT IN AEPB: 1.0000 | INHALATION_SPRAY | Freq: Every day | RESPIRATORY_TRACT | Status: DC

## 2021-04-04 MED ORDER — AMIODARONE HCL 200 MG PO TABS
200.0000 mg | ORAL_TABLET | Freq: Every day | ORAL | Status: DC
  Administered 2021-04-05 – 2021-04-09 (×5): 200 mg via ORAL
  Filled 2021-04-04 (×5): qty 1

## 2021-04-04 MED ORDER — IPRATROPIUM-ALBUTEROL 0.5-2.5 (3) MG/3ML IN SOLN
3.0000 mL | Freq: Two times a day (BID) | RESPIRATORY_TRACT | Status: DC
  Administered 2021-04-04 – 2021-04-09 (×9): 3 mL via RESPIRATORY_TRACT
  Filled 2021-04-04 (×10): qty 3

## 2021-04-04 MED ORDER — SODIUM CHLORIDE 0.9 % IV SOLN: INTRAVENOUS | Status: AC

## 2021-04-04 MED ORDER — ACETYLCYSTEINE 20 % IN SOLN
4.0000 mL | Freq: Two times a day (BID) | RESPIRATORY_TRACT | Status: DC
  Administered 2021-04-04 – 2021-04-09 (×8): 4 mL via RESPIRATORY_TRACT
  Filled 2021-04-04 (×12): qty 4

## 2021-04-04 NOTE — ED Notes (Signed)
Hospitalist at bedside 

## 2021-04-04 NOTE — ED Notes (Signed)
RN aware bed assigned ?

## 2021-04-04 NOTE — ED Notes (Signed)
Spoke to Pt's wife to provide and update. Wife is concerned the Pt is not getting fluids.  She also asks that some help feed him once he is off bipap.

## 2021-04-04 NOTE — Progress Notes (Signed)
BiPAP mask removed and pt placed on 4Lnc with O2 sat in high 90's at this time.

## 2021-04-04 NOTE — ED Notes (Addendum)
Pt noted to be tolerating 4L [place].  Pt has a "junky" cough but unable to produce sputum.   Pt noted to have a blood in mouth d/t drying effect of bipap.  Oral care provided.  Pt able to tolerate 2-3 sips of ice water.

## 2021-04-04 NOTE — ED Notes (Signed)
Pt noted to resting and tolerating bipap well.

## 2021-04-04 NOTE — Progress Notes (Signed)
PROGRESS NOTE    Raymond Hays  ERX:[id number] DOB: Dec 20, 1945 DOA: 04/03/2021 PCP: [facility]  [id number]   Assessment & Plan:   Principal Problem:   Acute respiratory failure (HCC)   Raymond Hays is a 76 y.o. Caucasian male with medical history significant for COPD, type 2 diabetes mellitus, hypertension, Parkinson's disease, paroxysmal atrial fibrillation and spinal stenosis, who presented to the ER with acute onset of altered mental status with significantly diminished responsiveness.  The patient was noted at his assisted living facility to be minimally responsive to painful stimuli with hypotension, fever and hypoxia.    1.  Acute hypoxic respiratory failure like secondary to underlying left-sided Community-acquired pneumonia. --placed on BiPAP, weaned to 4L [place] today - started on IV Rocephin, Zithromax and IV Vanc on admission Plan: --cont ceftriaxone and azithromycin --d/c IV vanc --chest PT and Mucomyst neb BID for mucus clearance  AKI --Cr 2.64 on presentation.  Baseline around 1.  Improved with IVF. --cont MIVF@50  for 20 hours  Bilateral LE edema 2/2 venous stasis --ACE wraps  2.  Paroxysmal atrial fibrillation with rapid ventricular response. --cont home oral amiodarone --cont home Eliquis  3.  Parkinson's disease. - We will continue Sinemet IR.  4.  Depression. - We will continue Lexapro.  5.  BPH. - We will continue Proscar.  6.  Dyslipidemia. - We will continue statin therapy.  7.  Type 2 diabetes mellitus with peripheral neuropathy. --BG has been within inpatient goal --d/c BG checks and SSI - We will continue Neurontin. - We will hold off Glucophage XR.  8.  COPD without exacerbation. - We will place him on scheduled and as needed DuoNebs. --cont home Trelegy Ellipta   DVT prophylaxis: PJ:KDTOIZT Code Status: DNR  Family Communication:  Level of care: Med-Surg Dispo:   The patient is from: home Anticipated d/c is to:  home Anticipated d/c date is: 2-3 days Patient currently is not medically ready to d/c due to: hypoxia, on IV abx for PNA   Subjective and Interval History:  Pt was weaned off BiPAP today.  Reported LE swelling chronic and worse on the right.     Objective: Vitals:   04/04/21 1530 04/04/21 1541 04/04/21 1600 04/04/21 1736  BP: 93/81  99/82 (!) 125/95  Pulse: 91 96 94 (!) 105  Resp: (!) 21 (!) 21 18 20   Temp:    97.6 F (36.4 C)  TempSrc:    Oral  SpO2: 100% 96% 98% 96%  Weight:      Height:        Intake/Output Summary (Last 24 hours) at 04/04/2021 1840 Last data filed at 04/04/2021 1800 Gross per 24 hour  Intake 1826.57 ml  Output 750 ml  Net 1076.57 ml   Filed Weights   04/03/21 1947  Weight: 102 kg    Examination:   Constitutional: NAD, alert, oriented to person and place, perseverates on the phone not being reachable HEENT: conjunctivae and lids normal, EOMI CV: No cyanosis.   RESP: gurgling airway, on 4L sating 99% Extremities: 2-3+ pitting edema in BLE, R>L. SKIN: warm, dry Neuro: II - XII grossly intact.     Data Reviewed: I have personally reviewed following labs and imaging studies  CBC: Recent Labs  Lab 04/03/21 1953 04/04/21 0651  WBC 9.1 8.4  NEUTROABS 7.6  --   HGB 13.9 11.4*  HCT 43.1 34.9*  MCV 94.9 96.1  PLT 398 267   Basic Metabolic Panel: Recent Labs  Lab 04/03/21 1953 04/04/21  0651  NA 133* 132*  K 5.5* 4.1  CL 97* 101  CO2 23 18*  GLUCOSE 148* 116*  BUN 47* 46*  CREATININE 2.64* 2.08*  CALCIUM 9.2 8.2*   GFR: Estimated Creatinine Clearance: 34.4 mL/min (A) (by C-G formula based on SCr of 2.08 mg/dL (H)). Liver Function Tests: No results for input(s): AST, ALT, ALKPHOS, BILITOT, PROT, ALBUMIN in the last 168 hours. No results for input(s): LIPASE, AMYLASE in the last 168 hours. No results for input(s): AMMONIA in the last 168 hours. Coagulation Profile: No results for input(s): INR, PROTIME in the last 168 hours. Cardiac  Enzymes: No results for input(s): CKTOTAL, CKMB, CKMBINDEX, TROPONINI in the last 168 hours. BNP (last 3 results) No results for input(s): PROBNP in the last 8760 hours. HbA1C: No results for input(s): HGBA1C in the last 72 hours. CBG: Recent Labs  Lab 04/04/21 0802 04/04/21 1123 04/04/21 1621  GLUCAP 131* 114* 96   Lipid Profile: No results for input(s): CHOL, HDL, LDLCALC, TRIG, CHOLHDL, LDLDIRECT in the last 72 hours. Thyroid Function Tests: No results for input(s): TSH, T4TOTAL, FREET4, T3FREE, THYROIDAB in the last 72 hours. Anemia Panel: No results for input(s): VITAMINB12, FOLATE, FERRITIN, TIBC, IRON, RETICCTPCT in the last 72 hours. Sepsis Labs: Recent Labs  Lab 04/03/21 2030 04/04/21 0113 04/04/21 0651  PROCALCITON  --   --  0.80  LATICACIDVEN 2.8* 1.6  --     Recent Results (from the past 240 hour(s))  Resp Panel by RT-PCR (Flu A&B, Covid) Nasopharyngeal Swab     Status: Abnormal   Collection Time: 03/26/21 11:56 AM   Specimen: Nasopharyngeal Swab; Nasopharyngeal(NP) swabs in vial transport medium  Result Value Ref Range Status   SARS Coronavirus 2 by RT PCR NEGATIVE NEGATIVE Final    Comment: (NOTE) SARS-CoV-2 target nucleic acids are NOT DETECTED.  The SARS-CoV-2 RNA is generally detectable in upper respiratory specimens during the acute phase of infection. The lowest concentration of SARS-CoV-2 viral copies this assay can detect is 138 copies/mL. A negative result does not preclude SARS-Cov-2 infection and should not be used as the sole basis for treatment or other patient management decisions. A negative result may occur with  improper specimen collection/handling, submission of specimen other than nasopharyngeal swab, presence of viral mutation(s) within the areas targeted by this assay, and inadequate number of viral copies(<138 copies/mL). A negative result must be combined with clinical observations, patient history, and  epidemiological information. The expected result is Negative.  Fact Sheet for Patients:  BloggerCourse.comhttps://www.fda.gov/media/152166/download  Fact Sheet for Healthcare Providers:  SeriousBroker.ithttps://www.fda.gov/media/152162/download  This test is no t yet approved or cleared by the [place] FDA and  has been authorized for detection and/or diagnosis of SARS-CoV-2 by FDA under an Emergency Use Authorization (EUA). This EUA will remain  in effect (meaning this test can be used) for the duration of the COVID-19 declaration under Section 564(b)(1) of the Act, 21 U.S.C.section 360bbb-3(b)(1), unless the authorization is terminated  or revoked sooner.       Influenza A by PCR POSITIVE (A) NEGATIVE Final   Influenza B by PCR NEGATIVE NEGATIVE Final    Comment: (NOTE) The Xpert Xpress SARS-CoV-2/FLU/RSV plus assay is intended as an aid in the diagnosis of influenza from Nasopharyngeal swab specimens and should not be used as a sole basis for treatment. Nasal washings and aspirates are unacceptable for Xpert Xpress SARS-CoV-2/FLU/RSV testing.  Fact Sheet for Patients: BloggerCourse.comhttps://www.fda.gov/media/152166/download  Fact Sheet for Healthcare Providers: SeriousBroker.ithttps://www.fda.gov/media/152162/download  This test is not  yet approved or cleared by the [place] FDA and has been authorized for detection and/or diagnosis of SARS-CoV-2 by FDA under an Emergency Use Authorization (EUA). This EUA will remain in effect (meaning this test can be used) for the duration of the COVID-19 declaration under Section 564(b)(1) of the Act, 21 U.S.C. section 360bbb-3(b)(1), unless the authorization is terminated or revoked.  Performed at [facility], [address] [place], [place] [address]   Blood culture (routine x 2)     Status: None (Preliminary result)   Collection Time: 04/03/21  8:30 PM   Specimen: BLOOD  Result Value Ref Range Status   Specimen Description BLOOD LEFT ANTECUBITAL  Final   Special  Requests   Final    BOTTLES DRAWN AEROBIC AND ANAEROBIC Blood Culture adequate volume   Culture   Final    NO GROWTH < 12 HOURS Performed at [facility], [address] [place], [place] [address]    Report Status PENDING  Incomplete  Resp Panel by RT-PCR (Flu A&B, Covid) Nasopharyngeal Swab     Status: None   Collection Time: 04/04/21  9:26 AM   Specimen: Nasopharyngeal Swab; Nasopharyngeal(NP) swabs in vial transport medium  Result Value Ref Range Status   SARS Coronavirus 2 by RT PCR NEGATIVE NEGATIVE Final    Comment: (NOTE) SARS-CoV-2 target nucleic acids are NOT DETECTED.  The SARS-CoV-2 RNA is generally detectable in upper respiratory specimens during the acute phase of infection. The lowest concentration of SARS-CoV-2 viral copies this assay can detect is 138 copies/mL. A negative result does not preclude SARS-Cov-2 infection and should not be used as the sole basis for treatment or other patient management decisions. A negative result may occur with  improper specimen collection/handling, submission of specimen other than nasopharyngeal swab, presence of viral mutation(s) within the areas targeted by this assay, and inadequate number of viral copies(<138 copies/mL). A negative result must be combined with clinical observations, patient history, and epidemiological information. The expected result is Negative.  Fact Sheet for Patients:  BloggerCourse.comhttps://www.fda.gov/media/152166/download  Fact Sheet for Healthcare Providers:  SeriousBroker.ithttps://www.fda.gov/media/152162/download  This test is no t yet approved or cleared by the [place] FDA and  has been authorized for detection and/or diagnosis of SARS-CoV-2 by FDA under an Emergency Use Authorization (EUA). This EUA will remain  in effect (meaning this test can be used) for the duration of the COVID-19 declaration under Section 564(b)(1) of the Act, 21 U.S.C.section 360bbb-3(b)(1), unless the authorization is terminated   or revoked sooner.       Influenza A by PCR NEGATIVE NEGATIVE Final   Influenza B by PCR NEGATIVE NEGATIVE Final    Comment: (NOTE) The Xpert Xpress SARS-CoV-2/FLU/RSV plus assay is intended as an aid in the diagnosis of influenza from Nasopharyngeal swab specimens and should not be used as a sole basis for treatment. Nasal washings and aspirates are unacceptable for Xpert Xpress SARS-CoV-2/FLU/RSV testing.  Fact Sheet for Patients: BloggerCourse.comhttps://www.fda.gov/media/152166/download  Fact Sheet for Healthcare Providers: SeriousBroker.ithttps://www.fda.gov/media/152162/download  This test is not yet approved or cleared by the [place] FDA and has been authorized for detection and/or diagnosis of SARS-CoV-2 by FDA under an Emergency Use Authorization (EUA). This EUA will remain in effect (meaning this test can be used) for the duration of the COVID-19 declaration under Section 564(b)(1) of the Act, 21 U.S.C. section 360bbb-3(b)(1), unless the authorization is terminated or revoked.  Performed at [facility], [address] [place], [place] [address]  Radiology Studies: DG Chest Port 1 View  Result Date: 04/03/2021 CLINICAL DATA:  Shortness of breath. EXAM: PORTABLE CHEST 1 VIEW COMPARISON:  03/22/2021, chest CT 01/09/2021 FINDINGS: Lower lung volumes from prior exam limiting assessment. Cardiomegaly likely accentuated by portable technique. Surgical clips project over the left heart. Stable mediastinal contours with aortic atherosclerosis. Ill-defined left suprahilar opacity, not seen on prior exam. No pneumothorax or large pleural effusion. No evident pulmonary edema. No acute osseous abnormalities are seen. Gaseous distention of bowel in the upper abdomen. IMPRESSION: 1. Ill-defined left suprahilar opacity, not seen on prior exam. This may be due to overlapping vascular structures, atelectasis, or pneumonia in the appropriate clinical setting. 2. Low lung volumes. Cardiomegaly  likely accentuated by a hypoaeration. Electronically Signed   By: Narda Rutherford M.D.   On: 04/03/2021 20:11     Scheduled Meds:  acetylcysteine  4 mL Nebulization BID   amiodarone  200 mg Oral BID   Followed by   Melene Muller ON 04/05/2021] amiodarone  200 mg Oral Daily   apixaban  5 mg Oral BID   atorvastatin  20 mg Oral QHS   carbidopa-levodopa  1 tablet Oral TID AC   escitalopram  5 mg Oral Daily   feeding supplement (GLUCERNA SHAKE)  237 mL Oral BID BM   finasteride  5 mg Oral Daily   [START ON 04/05/2021] fluticasone  1 spray Each Nare Daily   gabapentin  300 mg Oral BID   insulin aspart  0-9 Units Subcutaneous TID AC & HS   ipratropium  2 spray Each Nare BID   ipratropium-albuterol  3 mL Nebulization BID   lactase  3,000 Units Oral TID WC   methocarbamol  1,000 mg Oral BID   metoprolol tartrate  25 mg Oral TID   Muscle Rub  1 application Topical TID   oxybutynin  10 mg Oral Daily   pantoprazole  40 mg Oral BID AC   polyethylene glycol  17 g Oral Daily   polyvinyl alcohol  2 drop Both Eyes TID   prednisoLONE acetate  1 drop Both Eyes QID   psyllium  1 packet Oral Daily   triamcinolone cream  1 application Topical BID   Continuous Infusions:  sodium chloride 50 mL/hr at 04/04/21 1746   azithromycin Stopped (04/04/21 0330)   cefTRIAXone (ROCEPHIN)  IV Stopped (04/03/21 2359)     LOS: 1 day     [name], MD [facility] If 7PM-7AM, please contact night-coverage 04/04/2021, 6:40 PM

## 2021-04-04 NOTE — ED Notes (Signed)
Called RT requesting readjustment of patient's mask because is going into his lip and putting a lot of pressure on left side of nose. When I attempted to readjust patient became agitated and more tachypneic.

## 2021-04-04 NOTE — Consult Note (Signed)
Pharmacy Antibiotic Note  Raymond Hays is a 76 y.o. male admitted on 04/03/2021 with pneumonia.  Pharmacy has been consulted for Vancomycin dosing.  -on Azithromycin, Ceftriaxone, Vancomycin  Plan: Scr improved 2.64>>2.08 Pt received Vancomycin 2250 mg LD x 1 in ED Will continue with Vancomycin 1500 mg IV Q 48 hrs. Next dose due 1/5 at 2200. Goal AUC 400-550. Expected AUC: 520 SCr used: 2.08 Cmin 10.6 Vd 0.5   BMI>30   Follow up MRSA PCR, cultures  Height: 5\' 8"  (172.7 cm) Weight: 102 kg (224 lb 13.9 oz) IBW/kg (Calculated) : 68.4  Temp (24hrs), Avg:99.6 F (37.6 C), Min:97.9 F (36.6 C), Max:101.2 F (38.4 C)  Recent Labs  Lab 04/03/21 1953 04/03/21 2030 04/04/21 0113 04/04/21 0651  WBC 9.1  --   --  8.4  CREATININE 2.64*  --   --  2.08*  LATICACIDVEN  --  2.8* 1.6  --      Estimated Creatinine Clearance: 34.4 mL/min (A) (by C-G formula based on SCr of 2.08 mg/dL (H)).    No Known Allergies  Antimicrobials this admission:  04/03/21 ceftriaxone >>  1/3 Vancomycin >>  Azith 1/4 >>   Dose adjustments this admission: N/a  Microbiology results: 1/3 BCx: sent 1/3 Sputum: sent  1/3 MRSA PCR: ordered  Thank you for allowing pharmacy to be a part of this patients care.  Noralee Space, PharmD Clinical Pharmacist   04/04/2021 8:26 AM

## 2021-04-04 NOTE — Sepsis Progress Note (Signed)
Notified bedside nurse of need to draw repeat lactic acid. 

## 2021-04-05 ENCOUNTER — Inpatient Hospital Stay: Payer: No Typology Code available for payment source

## 2021-04-05 LAB — CBC
HCT: 34.9 % — ABNORMAL LOW (ref 39.0–52.0)
Hemoglobin: 11.6 g/dL — ABNORMAL LOW (ref 13.0–17.0)
MCH: 31.4 pg (ref 26.0–34.0)
MCHC: 33.2 g/dL (ref 30.0–36.0)
MCV: 94.3 fL (ref 80.0–100.0)
Platelets: 237 10*3/uL (ref 150–400)
RBC: 3.7 MIL/uL — ABNORMAL LOW (ref 4.22–5.81)
RDW: 16.6 % — ABNORMAL HIGH (ref 11.5–15.5)
WBC: 9.2 10*3/uL (ref 4.0–10.5)
nRBC: 0.2 % (ref 0.0–0.2)

## 2021-04-05 LAB — BASIC METABOLIC PANEL
Anion gap: 15 (ref 5–15)
BUN: 42 mg/dL — ABNORMAL HIGH (ref 8–23)
CO2: 20 mmol/L — ABNORMAL LOW (ref 22–32)
Calcium: 8.4 mg/dL — ABNORMAL LOW (ref 8.9–10.3)
Chloride: 100 mmol/L (ref 98–111)
Creatinine, Ser: 1.45 mg/dL — ABNORMAL HIGH (ref 0.61–1.24)
GFR, Estimated: 50 mL/min — ABNORMAL LOW (ref >60)
Glucose, Bld: 98 mg/dL (ref 70–99)
Potassium: 3.5 mmol/L (ref 3.5–5.1)
Sodium: 135 mmol/L (ref 135–145)

## 2021-04-05 LAB — COMPREHENSIVE METABOLIC PANEL
ALT: 11 U/L (ref 0–44)
AST: 61 U/L — ABNORMAL HIGH (ref 15–41)
Albumin: 2.9 g/dL — ABNORMAL LOW (ref 3.5–5.0)
Alkaline Phosphatase: 84 U/L (ref 38–126)
Anion gap: 11 (ref 5–15)
BUN: 42 mg/dL — ABNORMAL HIGH (ref 8–23)
CO2: 23 mmol/L (ref 22–32)
Calcium: 8.6 mg/dL — ABNORMAL LOW (ref 8.9–10.3)
Chloride: 101 mmol/L (ref 98–111)
Creatinine, Ser: 1.65 mg/dL — ABNORMAL HIGH (ref 0.61–1.24)
GFR, Estimated: 43 mL/min — ABNORMAL LOW (ref >60)
Glucose, Bld: 130 mg/dL — ABNORMAL HIGH (ref 70–99)
Potassium: 4 mmol/L (ref 3.5–5.1)
Sodium: 135 mmol/L (ref 135–145)
Total Bilirubin: 1.1 mg/dL (ref 0.3–1.2)
Total Protein: 7 g/dL (ref 6.5–8.1)

## 2021-04-05 LAB — MAGNESIUM
Magnesium: 2.3 mg/dL (ref 1.7–2.4)
Magnesium: 2.1 mg/dL (ref 1.7–2.4)

## 2021-04-05 LAB — LEGIONELLA PNEUMOPHILA SEROGP 1 UR AG: L. pneumophila Serogp 1 Ur Ag: NEGATIVE

## 2021-04-05 MED ORDER — POLYETHYLENE GLYCOL 3350 17 G PO PACK
34.0000 g | PACK | ORAL | Status: AC
  Administered 2021-04-05 – 2021-04-06 (×5): 34 g via ORAL
  Filled 2021-04-05 (×5): qty 2

## 2021-04-05 MED ORDER — SORBITOL 70 % SOLN
960.0000 mL | TOPICAL_OIL | Freq: Once | ORAL | Status: AC
  Administered 2021-04-05: 960 mL via RECTAL
  Filled 2021-04-05: qty 473

## 2021-04-05 MED ORDER — FUROSEMIDE 10 MG/ML IJ SOLN
80.0000 mg | Freq: Once | INTRAMUSCULAR | Status: AC
  Administered 2021-04-05: 80 mg via INTRAVENOUS
  Filled 2021-04-05: qty 8

## 2021-04-05 NOTE — Progress Notes (Signed)
Pt placed on bipap for shortness of breath, MD made aware.

## 2021-04-05 NOTE — Progress Notes (Signed)
Patient was doing well until after all his medications were completed and I was doing his assessment. He started to complain of not being able to breathe. His oxygen was at 4L and his sats were in the 90's. I gave patient his PRN duoneb and called for respiratory to come see patient. Also called Manuela Schwartz NP and Bishop Limbo NP to come see patient. New orders were placed as well as transfer orders put in. Will continue to monitor patient.

## 2021-04-05 NOTE — Progress Notes (Signed)
Bipap taken off and placed on 4lpm [place], pt says he feels better at this time. Will continue to monitor.

## 2021-04-05 NOTE — TOC Initial Note (Signed)
Transition of Care [facility]) - Initial/Assessment Note    Patient Details  Name: Raymond Hays MRN: [id number] Date of Birth: Nov 25, 1945  [facility] CM/SW Contact:    Gildardo Griffes, LCSW Phone Number: 04/05/2021, 9:05 AM  Clinical Narrative:                  Patient known to this CSW from last recent admission .   Patient is from [facility] patient is a long term resident there under the [facility]. Per Gavin Pound with admissions at [facility], inform her when patient is ready to return, no prior auth needed.    TOC will continue to follow for discharge readiness back to [facility].   Expected Discharge Plan: Skilled Nursing Facility Barriers to Discharge: Continued Medical Work up   Patient Goals and CMS Choice Patient states their goals for this hospitalization and ongoing recovery are:: to go home CMS Medicare.gov Compare Post Acute Care list provided to:: Patient Represenative (must comment) (daughter Eunice Blase) Choice offered to / list presented to : Adult Children  Expected Discharge Plan and Services Expected Discharge Plan: Skilled Nursing Facility       Living arrangements for the past 2 months: Skilled Nursing Facility                                      Prior Living Arrangements/Services Living arrangements for the past 2 months: Skilled Nursing Facility Lives with:: Facility Resident                   Activities of Daily Living Home Assistive Devices/Equipment: None ADL Screening (condition at time of admission) Patient's cognitive ability adequate to safely complete daily activities?: No Is the patient deaf or have difficulty hearing?: No Does the patient have difficulty seeing, even when wearing glasses/contacts?: No Does the patient have difficulty concentrating, remembering, or making decisions?: No Patient able to express need for assistance with ADLs?: Yes Does the patient have difficulty dressing or bathing?:  Yes Independently performs ADLs?: No Communication: Independent Dressing (OT): Needs assistance Is this a change from baseline?: Pre-admission baseline Grooming: Needs assistance Is this a change from baseline?: Pre-admission baseline Feeding: Needs assistance Is this a change from baseline?: Pre-admission baseline Bathing: Needs assistance Is this a change from baseline?: Pre-admission baseline Toileting: Needs assistance Is this a change from baseline?: Pre-admission baseline In/Out Bed: Needs assistance Is this a change from baseline?: Pre-admission baseline Walks in Home: Dependent Is this a change from baseline?: Pre-admission baseline Does the patient have difficulty walking or climbing stairs?: Yes Weakness of Legs: Both Weakness of Arms/Hands: Both  Permission Sought/Granted                  Emotional Assessment         Alcohol / Substance Use: Not Applicable Psych Involvement: No (comment)  Admission diagnosis:  Acute respiratory failure (HCC) [J96.00] Altered mental status, unspecified altered mental status type [R41.82] Pneumonia due to infectious organism, unspecified laterality, unspecified part of lung [J18.9] Patient Active Problem List   Diagnosis Date Noted   Acute respiratory failure (HCC) 04/03/2021   E. coli UTI    Herpes zoster conjunctivitis of left eye    Severe sepsis with septic shock (HCC) 03/16/2021   Obstipation 01/30/2021   Anxiety 01/30/2021   Iron deficiency anemia    Edema leg    Respiratory failure (HCC)  01/09/2021   Ileus (HCC)    Neck pain    AKI (acute kidney injury) (HCC)    Type 2 diabetes mellitus with diabetic neuropathy, without long-term current use of insulin (HCC)    Parkinson disease (HCC)    Sepsis (HCC) 10/16/2020   Chronic respiratory failure with hypoxia (HCC) 01/10/2020   Impaired functional mobility, balance, gait, and endurance 09/15/2019   Catheter cystitis (HCC) 10/16/2018   COVID-19 virus detected  07/03/2018   Benign prostatic hyperplasia with lower urinary tract symptoms 08/06/2017   Osteoarthritis of right knee 06/19/2017   S/P orthopedic surgery, follow-up exam 10/25/2016   Tendon rupture of wrist, sequela 01/02/2016   Acute pain of right knee 08/03/2015   Calcific tendinitis of left shoulder 07/05/2015   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/05/2015   Gait difficulty 06/26/2015   Severe mitral regurgitation 06/05/2015   AF (paroxysmal atrial fibrillation) (HCC) 05/31/2015   Chronic pain 05/31/2015   COPD with acute exacerbation (HCC) 05/31/2015   Essential hypertension 05/31/2015   Chronic pain of left wrist 04/24/2015   Arthritis of left wrist 04/24/2015   Rupture of extensor tendon of left hand 04/24/2015   PCP:  [facility] Pharmacy:   [facility] - [place], [place] - [address] [place] [address] Phone: [phone] Fax: [phone]     Social Determinants of Health (SDOH) Interventions    Readmission Risk Interventions Readmission Risk Prevention Plan 01/12/2021 10/20/2020  Transportation Screening Complete Complete  PCP or Specialist Appt within 3-5 Days - Complete  HRI or Home Care Consult - Complete  Palliative Care Screening - Not Applicable  Medication Review (RN Care Manager) Complete Referral to Pharmacy  PCP or Specialist appointment within 3-5 days of discharge Complete -  HRI or Home Care Consult Complete -  SW Recovery Care/Counseling Consult Complete -  Palliative Care Screening Not Applicable -  Skilled Nursing Facility Complete -

## 2021-04-05 NOTE — Progress Notes (Signed)
PROGRESS NOTE    Raymond Hays  GNF:[id number] DOB: 05/06/1945 DOA: 04/03/2021 PCP: [facility]  [id number]/[id number]   Assessment & Plan:   Principal Problem:   Acute respiratory failure (HCC)   Raymond Hays is a 76 y.o. Caucasian male with medical history significant for COPD, type 2 diabetes mellitus, hypertension, Parkinson's disease, paroxysmal atrial fibrillation and spinal stenosis, who presented to the ER with acute onset of altered mental status with significantly diminished responsiveness.  The patient was noted at his assisted living facility to be minimally responsive to painful stimuli with hypotension, fever and hypoxia.    1.  Acute hypoxic respiratory failure like secondary to  left-sided Community-acquired pneumonia --placed on BiPAP, weaned to 4L [place], however, intermittently needed to be put back on BiPAP - started on IV Rocephin, Zithromax and IV Vanc on admission.   IV Vanc not continued. Plan: --cont ceftriaxone and azithromycin --Mucomyst neb BID for mucus clearance  Diffuse dilatation of large and small bowel  --progressive since at least Nov.  Large amount of stool in the rectum also seen in all abdominal imaging since Oct. --severe abdominal distention likely contributed to pt's respiratory distress by inhibiting lung expansion. Plan: --enema  --High-dose Miralax  AKI --Cr 2.64 on presentation.  Baseline around 1.  Improved with IVF. --oral hydration now  Bilateral LE edema 2/2 venous stasis --apply ACE wraps  2.  Paroxysmal atrial fibrillation with rapid ventricular response. --cont home oral amiodarone --cont home Eliquis  3.  Parkinson's disease. - We will continue Sinemet IR.  4.  Depression. - We will continue Lexapro.  5.  BPH. - We will continue Proscar.  6.  Dyslipidemia. - We will continue statin therapy.  7.  Type 2 diabetes mellitus with peripheral neuropathy. --BG has been within inpatient goal --d/c'ed BG checks and SSI - We will  continue Neurontin. - We will hold off Glucophage XR.  8.  COPD without exacerbation. - We will place him on scheduled and as needed DuoNebs. --cont home Trelegy Ellipta   DVT prophylaxis: QI:ONGEXBMOn:Eliquis Code Status: DNR  Family Communication: sister Lanora Manislizabeth updated on the phone today.  Level of care: Progressive Dispo:   The patient is from: home Anticipated d/c is to: home Anticipated d/c date is: 2-3 days Patient currently is not medically ready to d/c due to: hypoxia, on IV abx for PNA   Subjective and Interval History:  Overnight, pt had respiratory distress and was put back on BiPAP and transferred to PCU.    Pt found to have distended abdomen, and KUB showed Diffuse large and small bowel dilatation with progression from the prior study. Large amount of stool in the rectum.    Objective: Vitals:   04/05/21 0408 04/05/21 0738 04/05/21 1135 04/05/21 1646  BP: (!) 88/65 96/74 104/71 102/80  Pulse: 99 99 (!) 105 100  Resp: 20 18 18    Temp: 97.8 F (36.6 C) 97.9 F (36.6 C) 98.1 F (36.7 C) 97.9 F (36.6 C)  TempSrc: Oral   Axillary  SpO2: 100% 100% 98% 93%  Weight:      Height:        Intake/Output Summary (Last 24 hours) at 04/05/2021 1759 Last data filed at 04/05/2021 1056 Gross per 24 hour  Intake 957.91 ml  Output 350 ml  Net 607.91 ml   Filed Weights   04/03/21 1947 04/05/21 0300  Weight: 102 kg 106.7 kg    Examination:   Constitutional: NAD, alert, oriented HEENT: conjunctivae and lids normal, EOMI  CV: No cyanosis.   RESP: on BiPAP GI: abdomen distended and tense Extremities: 2-3 pitting edema in BLE   Data Reviewed: I have personally reviewed following labs and imaging studies  CBC: Recent Labs  Lab 04/03/21 1953 04/04/21 0651 04/05/21 0729  WBC 9.1 8.4 9.2  NEUTROABS 7.6  --   --   HGB 13.9 11.4* 11.6*  HCT 43.1 34.9* 34.9*  MCV 94.9 96.1 94.3  PLT 398 267 237   Basic Metabolic Panel: Recent Labs  Lab 04/03/21 1953 04/04/21 0651  04/05/21 0029 04/05/21 0729  NA 133* 132* 135 135  K 5.5* 4.1 4.0 3.5  CL 97* 101 101 100  CO2 23 18* 23 20*  GLUCOSE 148* 116* 130* 98  BUN 47* 46* 42* 42*  CREATININE 2.64* 2.08* 1.65* 1.45*  CALCIUM 9.2 8.2* 8.6* 8.4*  MG  --   --  2.3 2.1   GFR: Estimated Creatinine Clearance: 50.5 mL/min (A) (by C-G formula based on SCr of 1.45 mg/dL (H)). Liver Function Tests: Recent Labs  Lab 04/05/21 0029  AST 61*  ALT 11  ALKPHOS 84  BILITOT 1.1  PROT 7.0  ALBUMIN 2.9*   No results for input(s): LIPASE, AMYLASE in the last 168 hours. No results for input(s): AMMONIA in the last 168 hours. Coagulation Profile: No results for input(s): INR, PROTIME in the last 168 hours. Cardiac Enzymes: No results for input(s): CKTOTAL, CKMB, CKMBINDEX, TROPONINI in the last 168 hours. BNP (last 3 results) No results for input(s): PROBNP in the last 8760 hours. HbA1C: No results for input(s): HGBA1C in the last 72 hours. CBG: Recent Labs  Lab 04/04/21 0802 04/04/21 1123 04/04/21 1621  GLUCAP 131* 114* 96   Lipid Profile: No results for input(s): CHOL, HDL, LDLCALC, TRIG, CHOLHDL, LDLDIRECT in the last 72 hours. Thyroid Function Tests: No results for input(s): TSH, T4TOTAL, FREET4, T3FREE, THYROIDAB in the last 72 hours. Anemia Panel: No results for input(s): VITAMINB12, FOLATE, FERRITIN, TIBC, IRON, RETICCTPCT in the last 72 hours. Sepsis Labs: Recent Labs  Lab 04/03/21 2030 04/04/21 0113 04/04/21 0651  PROCALCITON  --   --  0.80  LATICACIDVEN 2.8* 1.6  --     Recent Results (from the past 240 hour(s))  Blood culture (routine x 2)     Status: None (Preliminary result)   Collection Time: 04/03/21  8:30 PM   Specimen: BLOOD  Result Value Ref Range Status   Specimen Description BLOOD LEFT ANTECUBITAL  Final   Special Requests   Final    BOTTLES DRAWN AEROBIC AND ANAEROBIC Blood Culture adequate volume   Culture   Final    NO GROWTH 2 DAYS Performed at [facility],  [address], [place], [place] [address]    Report Status PENDING  Incomplete  Resp Panel by RT-PCR (Flu A&B, Covid) Nasopharyngeal Swab     Status: None   Collection Time: 04/04/21  9:26 AM   Specimen: Nasopharyngeal Swab; Nasopharyngeal(NP) swabs in vial transport medium  Result Value Ref Range Status   SARS Coronavirus 2 by RT PCR NEGATIVE NEGATIVE Final    Comment: (NOTE) SARS-CoV-2 target nucleic acids are NOT DETECTED.  The SARS-CoV-2 RNA is generally detectable in upper respiratory specimens during the acute phase of infection. The lowest concentration of SARS-CoV-2 viral copies this assay can detect is 138 copies/mL. A negative result does not preclude SARS-Cov-2 infection and should not be used as the sole basis for treatment or other patient management decisions. A negative result may  occur with  improper specimen collection/handling, submission of specimen other than nasopharyngeal swab, presence of viral mutation(s) within the areas targeted by this assay, and inadequate number of viral copies(<138 copies/mL). A negative result must be combined with clinical observations, patient history, and epidemiological information. The expected result is Negative.  Fact Sheet for Patients:  BloggerCourse.comhttps://www.fda.gov/media/152166/download  Fact Sheet for Healthcare Providers:  SeriousBroker.ithttps://www.fda.gov/media/152162/download  This test is no t yet approved or cleared by the [place] FDA and  has been authorized for detection and/or diagnosis of SARS-CoV-2 by FDA under an Emergency Use Authorization (EUA). This EUA will remain  in effect (meaning this test can be used) for the duration of the COVID-19 declaration under Section 564(b)(1) of the Act, 21 U.S.C.section 360bbb-3(b)(1), unless the authorization is terminated  or revoked sooner.       Influenza A by PCR NEGATIVE NEGATIVE Final   Influenza B by PCR NEGATIVE NEGATIVE Final    Comment: (NOTE) The Xpert Xpress  SARS-CoV-2/FLU/RSV plus assay is intended as an aid in the diagnosis of influenza from Nasopharyngeal swab specimens and should not be used as a sole basis for treatment. Nasal washings and aspirates are unacceptable for Xpert Xpress SARS-CoV-2/FLU/RSV testing.  Fact Sheet for Patients: BloggerCourse.comhttps://www.fda.gov/media/152166/download  Fact Sheet for Healthcare Providers: SeriousBroker.ithttps://www.fda.gov/media/152162/download  This test is not yet approved or cleared by the [place] FDA and has been authorized for detection and/or diagnosis of SARS-CoV-2 by FDA under an Emergency Use Authorization (EUA). This EUA will remain in effect (meaning this test can be used) for the duration of the COVID-19 declaration under Section 564(b)(1) of the Act, 21 U.S.C. section 360bbb-3(b)(1), unless the authorization is terminated or revoked.  Performed at [facility], [address] [place], [place] [address]       Radiology Studies: DG Chest 1 View  Result Date: 04/05/2021 CLINICAL DATA:  Dyspnea EXAM: CHEST  1 VIEW COMPARISON:  04/03/2021 FINDINGS: Lung volumes are small and there is resultant vascular crowding the hilar and bibasilar atelectasis. Biapical opacities are present likely representing vascular shadow given their symmetry. No focal consolidation. No pneumothorax or pleural effusion. Mitral clips overlie the cardiac silhouette. Global cardiac size within normal limits. No acute bone abnormality. IMPRESSION: Pulmonary hypoinflation. Electronically Signed   By: Helyn NumbersAshesh  Parikh M.D.   On: 04/05/2021 00:35   DG Abd 1 View  Result Date: 04/05/2021 CLINICAL DATA:  Abdominal distension EXAM: ABDOMEN - 1 VIEW COMPARISON:  03/21/2021 FINDINGS: Diffuse dilatation of large and small bowel with progression. Large amount of stool in the rectum. No bowel wall edema. Bilateral hip replacement. IMPRESSION: Diffuse large and small bowel dilatation with progression from the prior study. Large amount of  stool in the rectum. Findings most compatible with ileus. Electronically Signed   By: Marlan Palauharles  Clark M.D.   On: 04/05/2021 14:28   DG Chest Port 1 View  Result Date: 04/03/2021 CLINICAL DATA:  Shortness of breath. EXAM: PORTABLE CHEST 1 VIEW COMPARISON:  03/22/2021, chest CT 01/09/2021 FINDINGS: Lower lung volumes from prior exam limiting assessment. Cardiomegaly likely accentuated by portable technique. Surgical clips project over the left heart. Stable mediastinal contours with aortic atherosclerosis. Ill-defined left suprahilar opacity, not seen on prior exam. No pneumothorax or large pleural effusion. No evident pulmonary edema. No acute osseous abnormalities are seen. Gaseous distention of bowel in the upper abdomen. IMPRESSION: 1. Ill-defined left suprahilar opacity, not seen on prior exam. This may be due to overlapping vascular structures, atelectasis, or pneumonia in the appropriate clinical setting.  2. Low lung volumes. Cardiomegaly likely accentuated by a hypoaeration. Electronically Signed   By: Narda Rutherford M.D.   On: 04/03/2021 20:11     Scheduled Meds:  acetylcysteine  4 mL Nebulization BID   amiodarone  200 mg Oral Daily   apixaban  5 mg Oral BID   atorvastatin  20 mg Oral QHS   carbidopa-levodopa  1 tablet Oral TID AC   escitalopram  5 mg Oral Daily   feeding supplement (GLUCERNA SHAKE)  237 mL Oral BID BM   finasteride  5 mg Oral Daily   fluticasone  1 spray Each Nare Daily   fluticasone furoate-vilanterol  1 puff Inhalation Daily   And   umeclidinium bromide  1 puff Inhalation Daily   gabapentin  300 mg Oral BID   ipratropium  2 spray Each Nare BID   ipratropium-albuterol  3 mL Nebulization BID   lactase  3,000 Units Oral TID WC   methocarbamol  1,000 mg Oral BID   metoprolol tartrate  25 mg Oral TID   Muscle Rub  1 application Topical TID   oxybutynin  10 mg Oral Daily   pantoprazole  40 mg Oral BID AC   polyethylene glycol  17 g Oral Daily   polyvinyl alcohol  2  drop Both Eyes TID   prednisoLONE acetate  1 drop Both Eyes QID   psyllium  1 packet Oral Daily   sorbitol, milk of mag, mineral oil, glycerin (SMOG) enema  960 mL Rectal Once   triamcinolone cream  1 application Topical BID   Continuous Infusions:  azithromycin Stopped (04/05/21 1056)   cefTRIAXone (ROCEPHIN)  IV Stopped (04/05/21 0007)     LOS: 2 days     [name], MD [facility] If 7PM-7AM, please contact night-coverage 04/05/2021, 5:59 PM

## 2021-04-06 ENCOUNTER — Inpatient Hospital Stay: Payer: No Typology Code available for payment source

## 2021-04-06 LAB — CBC
HCT: 34.8 % — ABNORMAL LOW (ref 39.0–52.0)
Hemoglobin: 11.5 g/dL — ABNORMAL LOW (ref 13.0–17.0)
MCH: 31.2 pg (ref 26.0–34.0)
MCHC: 33 g/dL (ref 30.0–36.0)
MCV: 94.3 fL (ref 80.0–100.0)
Platelets: 202 10*3/uL (ref 150–400)
RBC: 3.69 MIL/uL — ABNORMAL LOW (ref 4.22–5.81)
RDW: 16.7 % — ABNORMAL HIGH (ref 11.5–15.5)
WBC: 9 10*3/uL (ref 4.0–10.5)
nRBC: 0 % (ref 0.0–0.2)

## 2021-04-06 LAB — BASIC METABOLIC PANEL
Anion gap: 10 (ref 5–15)
BUN: 43 mg/dL — ABNORMAL HIGH (ref 8–23)
CO2: 21 mmol/L — ABNORMAL LOW (ref 22–32)
Calcium: 8.3 mg/dL — ABNORMAL LOW (ref 8.9–10.3)
Chloride: 105 mmol/L (ref 98–111)
Creatinine, Ser: 1.4 mg/dL — ABNORMAL HIGH (ref 0.61–1.24)
GFR, Estimated: 52 mL/min — ABNORMAL LOW (ref >60)
Glucose, Bld: 100 mg/dL — ABNORMAL HIGH (ref 70–99)
Potassium: 3.7 mmol/L (ref 3.5–5.1)
Sodium: 136 mmol/L (ref 135–145)

## 2021-04-06 LAB — MAGNESIUM: Magnesium: 2.1 mg/dL (ref 1.7–2.4)

## 2021-04-06 MED ORDER — POLYETHYLENE GLYCOL 3350 17 G PO PACK
34.0000 g | PACK | ORAL | Status: AC
  Administered 2021-04-06 (×3): 34 g via ORAL
  Filled 2021-04-06 (×3): qty 2

## 2021-04-06 MED ORDER — LIDOCAINE VISCOUS HCL 2 % MT SOLN
15.0000 mL | Freq: Four times a day (QID) | OROMUCOSAL | Status: DC
  Administered 2021-04-06 – 2021-04-07 (×4): 15 mL via OROMUCOSAL
  Filled 2021-04-06 (×6): qty 15

## 2021-04-06 MED ORDER — AZITHROMYCIN 250 MG PO TABS
500.0000 mg | ORAL_TABLET | Freq: Every day | ORAL | Status: AC
  Administered 2021-04-07 – 2021-04-08 (×2): 500 mg via ORAL
  Filled 2021-04-06 (×2): qty 2

## 2021-04-06 NOTE — Progress Notes (Signed)
PHARMACIST - PHYSICIAN COMMUNICATION  CONCERNING: Antibiotic IV to Oral Route Change Policy  RECOMMENDATION: This patient is receiving azithromycin by the intravenous route.  Based on criteria approved by the Pharmacy and Therapeutics Committee, the antibiotic(s) is/are being converted to the equivalent oral dose form(s).   DESCRIPTION: These criteria include: Patient being treated for a respiratory tract infection, urinary tract infection, cellulitis or clostridium difficile associated diarrhea if on metronidazole The patient is not neutropenic and does not exhibit a GI malabsorption state The patient is eating (either orally or via tube) and/or has been taking other orally administered medications for a least 24 hours The patient is improving clinically and has a Tmax < 100.5  If you have questions about this conversion, please contact the Pharmacy Department   Tressie Ellis  04/06/21

## 2021-04-06 NOTE — Progress Notes (Signed)
PROGRESS NOTE    Raymond Hays  LDJ:[id number] DOB: February 17, 1946 DOA: 04/03/2021 PCP: [facility]  [id number]/[id number]   Assessment & Plan:   Principal Problem:   Acute respiratory failure (HCC)   Raymond Hays is a 76 y.o. Caucasian male with medical history significant for COPD, type 2 diabetes mellitus, hypertension, Parkinson's disease, paroxysmal atrial fibrillation and spinal stenosis, who presented to the ER with acute onset of altered mental status with significantly diminished responsiveness.  The patient was noted at his assisted living facility to be minimally responsive to painful stimuli with hypotension, fever and hypoxia.    1.  Acute hypoxic respiratory failure like secondary to  left-sided Community-acquired pneumonia --placed on BiPAP, weaned to 4L [place], however, intermittently needed to be put back on BiPAP - started on IV Rocephin, Zithromax and IV Vanc on admission.   IV Vanc not continued. Plan: --cont ceftriaxone and azithromycin --Mucomyst neb BID for mucus clearance --BiPAP PRN  Diffuse dilatation of large and small bowel with stool impaction --progressive since at least Nov.  Large amount of stool in the rectum also seen in all abdominal imaging since Oct. --severe abdominal distention likely contributed to pt's respiratory distress by inhibiting lung expansion. Plan: --cont high-dose Miralax  AKI --Cr 2.64 on presentation.  Baseline around 1.  Improved with IVF. --oral hydration now  Bilateral LE edema 2/2 venous stasis --apply ACE wraps  2.  Paroxysmal atrial fibrillation with rapid ventricular response. --cont home oral amiodarone --cont home Eliquis --hold home metop due to low BP  3.  Parkinson's disease. - We will continue Sinemet IR.  4.  Depression. - We will continue Lexapro.  5.  BPH. - We will continue Proscar.  6.  Dyslipidemia. - We will continue statin therapy.  7.  Type 2 diabetes mellitus with peripheral neuropathy. --BG has been  within inpatient goal --d/c'ed BG checks and SSI - We will continue Neurontin. - We will hold off Glucophage XR.  8.  COPD without exacerbation. - We will place him on scheduled and as needed DuoNebs. --cont home Trelegy Ellipta   DVT prophylaxis: QZ:ESPQZRA Code Status: DNR  Family Communication:  Level of care: Progressive Dispo:   The patient is from: home Anticipated d/c is to: home Anticipated d/c date is: 2-3 days Patient currently is not medically ready to d/c due to: on IV abx for PNA, severe bowel dilatation due to stool impaction   Subjective and Interval History:  Pt had at least 1 episode of large soft BM today.  Pt complained of sore mouth.  Able to drink his miralax.   Objective: Vitals:   04/06/21 1208 04/06/21 1557 04/06/21 1600 04/06/21 1630  BP: 92/66 (!) 87/55  93/66  Pulse: 85 95  87  Resp: 20 17  18   Temp: 97.8 F (36.6 C)  97.6 F (36.4 C)   TempSrc:   Axillary   SpO2: 93%  100% 100%  Weight:      Height:        Intake/Output Summary (Last 24 hours) at 04/06/2021 1808 Last data filed at 04/06/2021 1031 Gross per 24 hour  Intake 910 ml  Output --  Net 910 ml   Filed Weights   04/03/21 1947 04/05/21 0300  Weight: 102 kg 106.7 kg    Examination:   Constitutional: NAD, AAOx3 HEENT: conjunctivae and lids normal, EOMI, mucosa dry CV: No cyanosis.   RESP: normal respiratory effort, on 4L GI: abdomen still distended Extremities: ACE wrap on BLE SKIN: warm,  dry Neuro: II - XII grossly intact.     Data Reviewed: I have personally reviewed following labs and imaging studies  CBC: Recent Labs  Lab 04/03/21 1953 04/04/21 0651 04/05/21 0729 04/06/21 0550  WBC 9.1 8.4 9.2 9.0  NEUTROABS 7.6  --   --   --   HGB 13.9 11.4* 11.6* 11.5*  HCT 43.1 34.9* 34.9* 34.8*  MCV 94.9 96.1 94.3 94.3  PLT 398 267 237 202   Basic Metabolic Panel: Recent Labs  Lab 04/03/21 1953 04/04/21 0651 04/05/21 0029 04/05/21 0729 04/06/21 0550  NA 133*  132* 135 135 136  K 5.5* 4.1 4.0 3.5 3.7  CL 97* 101 101 100 105  CO2 23 18* 23 20* 21*  GLUCOSE 148* 116* 130* 98 100*  BUN 47* 46* 42* 42* 43*  CREATININE 2.64* 2.08* 1.65* 1.45* 1.40*  CALCIUM 9.2 8.2* 8.6* 8.4* 8.3*  MG  --   --  2.3 2.1 2.1   GFR: Estimated Creatinine Clearance: 52.3 mL/min (A) (by C-G formula based on SCr of 1.4 mg/dL (H)). Liver Function Tests: Recent Labs  Lab 04/05/21 0029  AST 61*  ALT 11  ALKPHOS 84  BILITOT 1.1  PROT 7.0  ALBUMIN 2.9*   No results for input(s): LIPASE, AMYLASE in the last 168 hours. No results for input(s): AMMONIA in the last 168 hours. Coagulation Profile: No results for input(s): INR, PROTIME in the last 168 hours. Cardiac Enzymes: No results for input(s): CKTOTAL, CKMB, CKMBINDEX, TROPONINI in the last 168 hours. BNP (last 3 results) No results for input(s): PROBNP in the last 8760 hours. HbA1C: No results for input(s): HGBA1C in the last 72 hours. CBG: Recent Labs  Lab 04/04/21 0802 04/04/21 1123 04/04/21 1621  GLUCAP 131* 114* 96   Lipid Profile: No results for input(s): CHOL, HDL, LDLCALC, TRIG, CHOLHDL, LDLDIRECT in the last 72 hours. Thyroid Function Tests: No results for input(s): TSH, T4TOTAL, FREET4, T3FREE, THYROIDAB in the last 72 hours. Anemia Panel: No results for input(s): VITAMINB12, FOLATE, FERRITIN, TIBC, IRON, RETICCTPCT in the last 72 hours. Sepsis Labs: Recent Labs  Lab 04/03/21 2030 04/04/21 0113 04/04/21 0651  PROCALCITON  --   --  0.80  LATICACIDVEN 2.8* 1.6  --     Recent Results (from the past 240 hour(s))  Blood culture (routine x 2)     Status: None (Preliminary result)   Collection Time: 04/03/21  8:30 PM   Specimen: BLOOD  Result Value Ref Range Status   Specimen Description BLOOD LEFT ANTECUBITAL  Final   Special Requests   Final    BOTTLES DRAWN AEROBIC AND ANAEROBIC Blood Culture adequate volume   Culture   Final    NO GROWTH 3 DAYS Performed at [facility],  [address] [place], [place] [address]    Report Status PENDING  Incomplete  Resp Panel by RT-PCR (Flu A&B, Covid) Nasopharyngeal Swab     Status: None   Collection Time: 04/04/21  9:26 AM   Specimen: Nasopharyngeal Swab; Nasopharyngeal(NP) swabs in vial transport medium  Result Value Ref Range Status   SARS Coronavirus 2 by RT PCR NEGATIVE NEGATIVE Final    Comment: (NOTE) SARS-CoV-2 target nucleic acids are NOT DETECTED.  The SARS-CoV-2 RNA is generally detectable in upper respiratory specimens during the acute phase of infection. The lowest concentration of SARS-CoV-2 viral copies this assay can detect is 138 copies/mL. A negative result does not preclude SARS-Cov-2 infection and should not be used as the sole basis  for treatment or other patient management decisions. A negative result may occur with  improper specimen collection/handling, submission of specimen other than nasopharyngeal swab, presence of viral mutation(s) within the areas targeted by this assay, and inadequate number of viral copies(<138 copies/mL). A negative result must be combined with clinical observations, patient history, and epidemiological information. The expected result is Negative.  Fact Sheet for Patients:  BloggerCourse.comhttps://www.fda.gov/media/152166/download  Fact Sheet for Healthcare Providers:  SeriousBroker.ithttps://www.fda.gov/media/152162/download  This test is no t yet approved or cleared by the [place] FDA and  has been authorized for detection and/or diagnosis of SARS-CoV-2 by FDA under an Emergency Use Authorization (EUA). This EUA will remain  in effect (meaning this test can be used) for the duration of the COVID-19 declaration under Section 564(b)(1) of the Act, 21 U.S.C.section 360bbb-3(b)(1), unless the authorization is terminated  or revoked sooner.       Influenza A by PCR NEGATIVE NEGATIVE Final   Influenza B by PCR NEGATIVE NEGATIVE Final    Comment: (NOTE) The Xpert Xpress  SARS-CoV-2/FLU/RSV plus assay is intended as an aid in the diagnosis of influenza from Nasopharyngeal swab specimens and should not be used as a sole basis for treatment. Nasal washings and aspirates are unacceptable for Xpert Xpress SARS-CoV-2/FLU/RSV testing.  Fact Sheet for Patients: BloggerCourse.comhttps://www.fda.gov/media/152166/download  Fact Sheet for Healthcare Providers: SeriousBroker.ithttps://www.fda.gov/media/152162/download  This test is not yet approved or cleared by the [place] FDA and has been authorized for detection and/or diagnosis of SARS-CoV-2 by FDA under an Emergency Use Authorization (EUA). This EUA will remain in effect (meaning this test can be used) for the duration of the COVID-19 declaration under Section 564(b)(1) of the Act, 21 U.S.C. section 360bbb-3(b)(1), unless the authorization is terminated or revoked.  Performed at [facility], [address] [place], [place] [address]       Radiology Studies: DG Chest 1 View  Result Date: 04/05/2021 CLINICAL DATA:  Dyspnea EXAM: CHEST  1 VIEW COMPARISON:  04/03/2021 FINDINGS: Lung volumes are small and there is resultant vascular crowding the hilar and bibasilar atelectasis. Biapical opacities are present likely representing vascular shadow given their symmetry. No focal consolidation. No pneumothorax or pleural effusion. Mitral clips overlie the cardiac silhouette. Global cardiac size within normal limits. No acute bone abnormality. IMPRESSION: Pulmonary hypoinflation. Electronically Signed   By: Helyn NumbersAshesh  Parikh M.D.   On: 04/05/2021 00:35   DG Abd 1 View  Result Date: 04/05/2021 CLINICAL DATA:  Abdominal distension EXAM: ABDOMEN - 1 VIEW COMPARISON:  03/21/2021 FINDINGS: Diffuse dilatation of large and small bowel with progression. Large amount of stool in the rectum. No bowel wall edema. Bilateral hip replacement. IMPRESSION: Diffuse large and small bowel dilatation with progression from the prior study. Large amount of  stool in the rectum. Findings most compatible with ileus. Electronically Signed   By: Marlan Palauharles  Clark M.D.   On: 04/05/2021 14:28     Scheduled Meds:  acetylcysteine  4 mL Nebulization BID   amiodarone  200 mg Oral Daily   apixaban  5 mg Oral BID   atorvastatin  20 mg Oral QHS   [START ON 04/07/2021] azithromycin  500 mg Oral Daily   carbidopa-levodopa  1 tablet Oral TID AC   escitalopram  5 mg Oral Daily   feeding supplement (GLUCERNA SHAKE)  237 mL Oral BID BM   finasteride  5 mg Oral Daily   fluticasone  1 spray Each Nare Daily   fluticasone furoate-vilanterol  1 puff Inhalation Daily  And   umeclidinium bromide  1 puff Inhalation Daily   gabapentin  300 mg Oral BID   ipratropium  2 spray Each Nare BID   ipratropium-albuterol  3 mL Nebulization BID   lactase  3,000 Units Oral TID WC   methocarbamol  1,000 mg Oral BID   metoprolol tartrate  25 mg Oral TID   Muscle Rub  1 application Topical TID   oxybutynin  10 mg Oral Daily   pantoprazole  40 mg Oral BID AC   polyethylene glycol  17 g Oral Daily   polyvinyl alcohol  2 drop Both Eyes TID   prednisoLONE acetate  1 drop Both Eyes QID   psyllium  1 packet Oral Daily   triamcinolone cream  1 application Topical BID   Continuous Infusions:  cefTRIAXone (ROCEPHIN)  IV 2 g (04/05/21 2209)     LOS: 3 days     [name], Raymond Hays [facility] If 7PM-7AM, please contact night-coverage 04/06/2021, 6:08 PM

## 2021-04-07 LAB — MAGNESIUM: Magnesium: 2.1 mg/dL (ref 1.7–2.4)

## 2021-04-07 LAB — CBC
HCT: 34.4 % — ABNORMAL LOW (ref 39.0–52.0)
Hemoglobin: 11.3 g/dL — ABNORMAL LOW (ref 13.0–17.0)
MCH: 31 pg (ref 26.0–34.0)
MCHC: 32.8 g/dL (ref 30.0–36.0)
MCV: 94.2 fL (ref 80.0–100.0)
Platelets: 184 10*3/uL (ref 150–400)
RBC: 3.65 MIL/uL — ABNORMAL LOW (ref 4.22–5.81)
RDW: 16.7 % — ABNORMAL HIGH (ref 11.5–15.5)
WBC: 10.9 10*3/uL — ABNORMAL HIGH (ref 4.0–10.5)
nRBC: 0.2 % (ref 0.0–0.2)

## 2021-04-07 LAB — BASIC METABOLIC PANEL
Anion gap: 10 (ref 5–15)
BUN: 40 mg/dL — ABNORMAL HIGH (ref 8–23)
CO2: 22 mmol/L (ref 22–32)
Calcium: 8.4 mg/dL — ABNORMAL LOW (ref 8.9–10.3)
Chloride: 101 mmol/L (ref 98–111)
Creatinine, Ser: 1.27 mg/dL — ABNORMAL HIGH (ref 0.61–1.24)
GFR, Estimated: 58 mL/min — ABNORMAL LOW (ref >60)
Glucose, Bld: 90 mg/dL (ref 70–99)
Potassium: 4.2 mmol/L (ref 3.5–5.1)
Sodium: 133 mmol/L — ABNORMAL LOW (ref 135–145)

## 2021-04-07 MED ORDER — FUROSEMIDE 10 MG/ML IJ SOLN
40.0000 mg | Freq: Once | INTRAMUSCULAR | Status: AC
  Administered 2021-04-07: 40 mg via INTRAVENOUS
  Filled 2021-04-07: qty 4

## 2021-04-07 MED ORDER — LIP MEDEX EX OINT
1.0000 "application " | TOPICAL_OINTMENT | CUTANEOUS | Status: DC | PRN
  Filled 2021-04-07: qty 0.1

## 2021-04-07 MED ORDER — POLYETHYLENE GLYCOL 3350 17 G PO PACK
34.0000 g | PACK | ORAL | Status: AC
  Administered 2021-04-07 (×3): 34 g via ORAL
  Filled 2021-04-07 (×4): qty 2

## 2021-04-07 NOTE — Evaluation (Signed)
Occupational Therapy Evaluation Patient Details Name: Raymond Hays MRN: [id number] DOB: 1946-03-30 Today's Date: 04/07/2021   History of Present Illness Raymond Hays is a 76 y.o. Caucasian male with medical history significant for COPD, type 2 diabetes mellitus, hypertension, Parkinson's disease, paroxysmal atrial fibrillation and spinal stenosis, who presented to the ER with acute onset of altered mental status with significantly diminished responsiveness.  The patient was noted at his assisted living facility to be minimally responsive to painful stimuli with hypotension, fever and hypoxia.  This has been worsening throughout the day.  He was placed on 100% nonrebreather with improved oxygenation.   Clinical Impression   Raymond Hays presents today with generalized weakness, limited UE ROM, limited sensation in lower body. He lives at [facility] nursing home and is dependent in IADLs and most ADLs. During today's session he was able to complete self-feeding following set up, to participate in UB dressing and grooming with Mod A from therapist and Max A for rolling in bed to complete linen change. Pt is A&O x 4, pleasant and talkative, reports that he feels SOB but is back to his baseline level of fxl mobility -- dependent for all OOB mobility. Given pt is at baseline level and demonstrates little/no potential for rehabilitation, will sign off and recommend DC back to SNF for long-term care.    Recommendations for follow up therapy are one component of a multi-disciplinary discharge planning process, led by the attending physician.  Recommendations may be updated based on patient status, additional functional criteria and insurance authorization.   Follow Up Recommendations  Long-term institutional care without follow-up therapy    Assistance Recommended at Discharge Frequent or constant Supervision/Assistance  Patient can return home with the following Assist for transportation;Direct  supervision/assist for financial management;Assistance with feeding;A lot of help with walking and/or transfers;A lot of help with bathing/dressing/bathroom;Assistance with cooking/housework;Direct supervision/assist for medications management    Functional Status Assessment  Patient has had a recent decline in their functional status and/or demonstrates limited ability to make significant improvements in function in a reasonable and predictable amount of time  Equipment Recommendations  None recommended by OT    Recommendations for Other Services       Precautions / Restrictions Precautions Precautions: Fall Restrictions Weight Bearing Restrictions: No      Mobility Bed Mobility Overal bed mobility: Needs Assistance Bed Mobility: Rolling Rolling: Max assist              Transfers                   General transfer comment: Uses hoyer lift for transfers      Balance     Sitting balance-Leahy Scale: Zero       Standing balance-Leahy Scale: Zero                             ADL either performed or assessed with clinical judgement   ADL Overall ADL's : At baseline;Needs assistance/impaired Eating/Feeding: Set up   Grooming: Set up;Supervision/safety   Upper Body Bathing: Minimal assistance;Set up       Upper Body Dressing : Moderate assistance                     General ADL Comments: Pt able to self-feed following set up, assisted with UB dressing and UB grooming.     Vision Baseline Vision/History: 1 Wears glasses Patient Visual Report: No change  from baseline       Perception     Praxis      Pertinent Vitals/Pain Pain Assessment: No/denies pain Breathing: normal Negative Vocalization: none Facial Expression: smiling or inexpressive Body Language: relaxed Consolability: no need to console PAINAD Score: 0     Hand Dominance Right   Extremity/Trunk Assessment Upper Extremity Assessment Upper Extremity  Assessment: Generalized weakness;LUE deficits/detail;RUE deficits/detail RUE Deficits / Details: Pt with severe limitations in bilateral shoulders, able to active flex 30 degrees and uses more of elbow and hand for tasks LUE Deficits / Details: Pt with severe limitations in bilateral shoulders, able to active flex 30 degrees and uses more of elbow and hand for tasks   Lower Extremity Assessment Lower Extremity Assessment: Generalized weakness       Communication Communication Communication: No difficulties   Cognition Arousal/Alertness: Awake/alert Behavior During Therapy: WFL for tasks assessed/performed Overall Cognitive Status: Within Functional Limits for tasks assessed                                 General Comments: Alert and oriented, pleasant, engaged     General Comments       Exercises Other Exercises Other Exercises: Educ re: repositioning in bed to prevent skin breakdown   Shoulder Instructions      Home Living Family/patient expects to be discharged to:: Skilled nursing facility                                 Additional Comments: Pt resides at [facility], he is hoyered to wheelchair when oob      Prior Functioning/Environment Prior Level of Function : Needs assist             Mobility Comments: requires max +2 for bed mobility, reports numbness in B LEs, and has B mobility shoulder limitations ADLs Comments: Dependent in all ADL except self-feeding        OT Problem List: Decreased strength;Impaired UE functional use;Decreased range of motion;Impaired balance (sitting and/or standing)      OT Treatment/Interventions:      OT Goals(Current goals can be found in the care plan section) Acute Rehab OT Goals Patient Stated Goal: To breathe beter OT Goal Formulation: With patient Time For Goal Achievement: 04/21/21 Potential to Achieve Goals: Good  OT Frequency:      Co-evaluation              AM-PAC OT "6  Clicks" Daily Activity     Outcome Measure Help from another person eating meals?: None Help from another person taking care of personal grooming?: A Little Help from another person toileting, which includes using toliet, bedpan, or urinal?: Total Help from another person bathing (including washing, rinsing, drying)?: A Lot Help from another person to put on and taking off regular upper body clothing?: Total Help from another person to put on and taking off regular lower body clothing?: Total 6 Click Score: 12   End of Session    Activity Tolerance: Patient tolerated treatment well Patient left: in bed;with call bell/phone within reach;with bed alarm set;Other (comment) (MD in room)  OT Visit Diagnosis: Muscle weakness (generalized) (M62.81);Other abnormalities of gait and mobility (R26.89)                Time: 2376-2831 OT Time Calculation (min): 19 min Charges:  OT General Charges $OT  Visit: 1 Visit OT Evaluation $OT Eval Moderate Complexity: 1 Mod OT Treatments $Self Care/Home Management : 8-22 mins Latina CraverKimberly Shaynna Husby, PhD, MS, OTR/L 04/07/21, 2:31 PM

## 2021-04-07 NOTE — TOC Progression Note (Signed)
Transition of Care [facility]) - Progression Note    Patient Details  Name: Arath Kaigler MRN: [id number] Date of Birth: Mar 08, 1946  Transition [facility] CM/SW Contact  Ashley Royalty Lutricia Feil, RN Phone Number: 04/07/2021, 2:52 PM  Clinical Narrative:    Adriana Reams [facility] for LTC pt to returning to the facility however supervisor on floor indicated we need to check with Gavin Pound [facility]) to confirm admission on Monday.  Will continue to follow up accordingly for discharge needs.   Expected Discharge Plan: Skilled Nursing Facility Barriers to Discharge: Continued Medical Work up  Expected Discharge Plan and Services Expected Discharge Plan: Skilled Nursing Facility       Living arrangements for the past 2 months: Skilled Nursing Facility                                       Social Determinants of Health (SDOH) Interventions    Readmission Risk Interventions Readmission Risk Prevention Plan 01/12/2021 10/20/2020  Transportation Screening Complete Complete  PCP or Specialist Appt within 3-5 Days - Complete  HRI or Home Care Consult - Complete  Palliative Care Screening - Not Applicable  Medication Review (RN Care Manager) Complete Referral to Pharmacy  PCP or Specialist appointment within 3-5 days of discharge Complete -  HRI or Home Care Consult Complete -  SW Recovery Care/Counseling Consult Complete -  Palliative Care Screening Not Applicable -  Skilled Nursing Facility Complete -

## 2021-04-07 NOTE — Progress Notes (Signed)
Spoke with the pt's sister over the phone, Ms. Debbie. She requested that she and her sister, Raymond Hays be informed first for any updates on the pt. Pt's son is currently unavailable. Secured chat Dr. Billie Ruddy regarding

## 2021-04-07 NOTE — Progress Notes (Signed)
PROGRESS NOTE    Raymond Hays  [id number] DOB: 06/05/1945 DOA: 04/03/2021 PCP: [facility]  [id number]/[id number]-AA   Assessment & Plan:   Principal Problem:   Acute respiratory failure ([place])   Raymond Hays is a 76 y.o. Caucasian male with medical history significant for COPD, type 2 diabetes mellitus, hypertension, Parkinson's disease, paroxysmal atrial fibrillation and spinal stenosis, who presented to the ER with acute onset of altered mental status with significantly diminished responsiveness.  The patient was noted at his assisted living facility to be minimally responsive to painful stimuli with hypotension, fever and hypoxia.    1.  Acute hypoxic respiratory failure like secondary to  left-sided Community-acquired pneumonia --placed on BiPAP, weaned to 4L [place], however, intermittently needed to be put back on BiPAP - started on IV Rocephin, Zithromax and IV Vanc on admission.   IV Vanc not continued. Plan: --cont ceftriaxone and azithro, day 5 of 5 --Mucomyst neb BID for mucus clearance --BiPAP PRN  Diffuse dilatation of large and small bowel with stool impaction --progressive since at least Nov.  Large amount of stool in the rectum also seen in all abdominal imaging since Oct. --severe abdominal distention likely contributed to pt's respiratory distress by inhibiting lung expansion. --pt also appeared to have lost sensation in his rectum and his ability to have a BM or control BM. Plan: --cont high-dose Miralax to relieve stool impaction --KUB to monitor   AKI --Cr 2.64 on presentation.  Baseline around 1.  Improved with IVF. --oral hydration now  Bilateral LE edema  --apply ACE wraps to bilateral LE --trial IV lasix 40 x1 today due to weeping from the thigh  2.  Paroxysmal atrial fibrillation with rapid ventricular response. --cont home oral amiodarone --cont home Eliquis --hold home metop due to low BP  3.  Parkinson's disease. - We will continue Sinemet IR.  4.   Depression. - We will continue Lexapro.  5.  BPH. - We will continue Proscar.  6.  Dyslipidemia. - We will continue statin therapy.  7.  Type 2 diabetes mellitus with peripheral neuropathy. --BG has been within inpatient goal --d/c'ed BG checks and SSI - We will continue Neurontin. - We will hold off Glucophage XR.  8.  COPD without exacerbation. - We will place him on scheduled and as needed DuoNebs. --cont home Trelegy Ellipta   DVT prophylaxis: [id number] Code Status: DNR  Family Communication:  Level of care: Progressive Dispo:   The patient is from: SNF Anticipated d/c is to: SNF Anticipated d/c date is: 2-3 days Patient currently is not medically ready to d/c due to: on IV abx for PNA, severe bowel dilatation due to stool impaction   Subjective and Interval History:  Pt reported breathing easier.  Pt said he can't tell if he is having a BM, and he feels numb from the waist down.     Objective: Vitals:   04/07/21 0526 04/07/21 0810 04/07/21 0813 04/07/21 1136  BP: (!) 92/59 (!) 130/92  106/75  Pulse: 92 93  (!) 54  Resp: 20 20  17   Temp: 97.6 F (36.4 C) 98.2 F (36.8 C)  (!) 96.4 F (35.8 C)  TempSrc: Oral     SpO2: 100% 100% 100% 100%  Weight:      Height:        Intake/Output Summary (Last 24 hours) at 04/07/2021 1557 Last data filed at 04/07/2021 0600 Gross per 24 hour  Intake 200 ml  Output 350 ml  Net -150 ml  Filed Weights   04/03/21 1947 04/05/21 0300  Weight: 102 kg 106.7 kg    Examination:   Constitutional: NAD, alert, oriented to person and place HEENT: conjunctivae and lids normal, EOMI CV: No cyanosis.   RESP: more gurgling today, on 3L GI: abdomen still distended, but improved from prior Extremities: ACE wrap over BLE Neuro: II - XII grossly intact.     Data Reviewed: I have personally reviewed following labs and imaging studies  CBC: Recent Labs  Lab 04/03/21 1953 04/04/21 0651 04/05/21 0729 04/06/21 0550  04/07/21 0422  WBC 9.1 8.4 9.2 9.0 10.9*  NEUTROABS 7.6  --   --   --   --   HGB 13.9 11.4* 11.6* 11.5* 11.3*  HCT 43.1 34.9* 34.9* 34.8* 34.4*  MCV 94.9 96.1 94.3 94.3 94.2  PLT 398 267 237 202 [id number]   Basic Metabolic Panel: Recent Labs  Lab 04/04/21 0651 04/05/21 0029 04/05/21 0729 04/06/21 0550 04/07/21 0422  NA 132* 135 135 136 133*  K 4.1 4.0 3.5 3.7 4.2  CL 101 101 100 105 101  CO2 18* 23 20* 21* 22  GLUCOSE 116* 130* 98 100* 90  BUN 46* 42* 42* 43* 40*  CREATININE 2.08* 1.65* 1.45* 1.40* 1.27*  CALCIUM 8.2* 8.6* 8.4* 8.3* 8.4*  MG  --  2.3 2.1 2.1 2.1   GFR: Estimated Creatinine Clearance: 57.7 mL/min (A) (by C-G formula based on SCr of 1.27 mg/dL (H)). Liver Function Tests: Recent Labs  Lab 04/05/21 0029  AST 61*  ALT 11  ALKPHOS 84  BILITOT 1.1  PROT 7.0  ALBUMIN 2.9*   No results for input(s): LIPASE, AMYLASE in the last 168 hours. No results for input(s): AMMONIA in the last 168 hours. Coagulation Profile: No results for input(s): INR, PROTIME in the last 168 hours. Cardiac Enzymes: No results for input(s): CKTOTAL, CKMB, CKMBINDEX, TROPONINI in the last 168 hours. BNP (last 3 results) No results for input(s): PROBNP in the last 8760 hours. HbA1C: No results for input(s): HGBA1C in the last 72 hours. CBG: Recent Labs  Lab 04/04/21 0802 04/04/21 1123 04/04/21 1621  GLUCAP 131* 114* 96   Lipid Profile: No results for input(s): CHOL, HDL, LDLCALC, TRIG, CHOLHDL, LDLDIRECT in the last 72 hours. Thyroid Function Tests: No results for input(s): TSH, T4TOTAL, FREET4, T3FREE, THYROIDAB in the last 72 hours. Anemia Panel: No results for input(s): VITAMINB12, FOLATE, FERRITIN, TIBC, IRON, RETICCTPCT in the last 72 hours. Sepsis Labs: Recent Labs  Lab 04/03/21 2030 04/04/21 0113 04/04/21 0651  PROCALCITON  --   --  0.80  LATICACIDVEN 2.8* 1.6  --     Recent Results (from the past 240 hour(s))  Blood culture (routine x 2)     Status: None  (Preliminary result)   Collection Time: 04/03/21  8:30 PM   Specimen: BLOOD  Result Value Ref Range Status   Specimen Description BLOOD LEFT ANTECUBITAL  Final   Special Requests   Final    BOTTLES DRAWN AEROBIC AND ANAEROBIC Blood Culture adequate volume   Culture   Final    NO GROWTH 4 DAYS Performed at [facility], [address], [place], [place] [address]    Report Status PENDING  Incomplete  Resp Panel by RT-PCR (Flu A&B, Covid) Nasopharyngeal Swab     Status: None   Collection Time: 04/04/21  9:26 AM   Specimen: Nasopharyngeal Swab; Nasopharyngeal(NP) swabs in vial transport medium  Result Value Ref Range Status   SARS Coronavirus 2 by  RT PCR NEGATIVE NEGATIVE Final    Comment: (NOTE) SARS-CoV-2 target nucleic acids are NOT DETECTED.  The SARS-CoV-2 RNA is generally detectable in upper respiratory specimens during the acute phase of infection. The lowest concentration of SARS-CoV-2 viral copies this assay can detect is 138 copies/mL. A negative result does not preclude SARS-Cov-2 infection and should not be used as the sole basis for treatment or other patient management decisions. A negative result may occur with  improper specimen collection/handling, submission of specimen other than nasopharyngeal swab, presence of viral mutation(s) within the areas targeted by this assay, and inadequate number of viral copies(<138 copies/mL). A negative result must be combined with clinical observations, patient history, and epidemiological information. The expected result is Negative.  Fact Sheet for Patients:  EntrepreneurPulse.com.au  Fact Sheet for Healthcare Providers:  IncredibleEmployment.be  This test is no t yet approved or cleared by the [place] FDA and  has been authorized for detection and/or diagnosis of SARS-CoV-2 by FDA under an Emergency Use Authorization (EUA). This EUA will remain  in effect (meaning this test  can be used) for the duration of the COVID-19 declaration under Section 564(b)(1) of the Act, 21 U.S.C.section 360bbb-3(b)(1), unless the authorization is terminated  or revoked sooner.       Influenza A by PCR NEGATIVE NEGATIVE Final   Influenza B by PCR NEGATIVE NEGATIVE Final    Comment: (NOTE) The Xpert Xpress SARS-CoV-2/FLU/RSV plus assay is intended as an aid in the diagnosis of influenza from Nasopharyngeal swab specimens and should not be used as a sole basis for treatment. Nasal washings and aspirates are unacceptable for Xpert Xpress SARS-CoV-2/FLU/RSV testing.  Fact Sheet for Patients: EntrepreneurPulse.com.au  Fact Sheet for Healthcare Providers: IncredibleEmployment.be  This test is not yet approved or cleared by the [place] FDA and has been authorized for detection and/or diagnosis of SARS-CoV-2 by FDA under an Emergency Use Authorization (EUA). This EUA will remain in effect (meaning this test can be used) for the duration of the COVID-19 declaration under Section 564(b)(1) of the Act, 21 U.S.C. section 360bbb-3(b)(1), unless the authorization is terminated or revoked.  Performed at [facility], [address] [place], [place] [address]       Radiology Studies: DG Abd 1 View  Result Date: 04/06/2021 CLINICAL DATA:  Abdominal distension EXAM: ABDOMEN - 1 VIEW COMPARISON:  04/05/2021 FINDINGS: 2 supine frontal views of the abdomen and pelvis demonstrate diffuse gaseous distension of the large and small bowel, with decreased colonic gas since prior study. Decreased stool burden within the rectal vault. No masses or abnormal calcifications. Lung bases are clear. IMPRESSION: 1. Diffuse gaseous distension of the large and small bowel, with decreased colonic gas and rectal stool burden since prior study. Findings could reflect resolving ileus or improvement after disimpaction. Continued radiographic follow-up  recommended. Electronically Signed   By: Randa Ngo M.D.   On: 04/06/2021 19:33     Scheduled Meds:  acetylcysteine  4 mL Nebulization BID   amiodarone  200 mg Oral Daily   apixaban  5 mg Oral BID   atorvastatin  20 mg Oral QHS   azithromycin  500 mg Oral Daily   carbidopa-levodopa  1 tablet Oral TID AC   escitalopram  5 mg Oral Daily   feeding supplement (GLUCERNA SHAKE)  237 mL Oral BID BM   finasteride  5 mg Oral Daily   fluticasone  1 spray Each Nare Daily   fluticasone furoate-vilanterol  1 puff Inhalation Daily  And   umeclidinium bromide  1 puff Inhalation Daily   gabapentin  300 mg Oral BID   ipratropium  2 spray Each Nare BID   ipratropium-albuterol  3 mL Nebulization BID   lactase  3,000 Units Oral TID WC   methocarbamol  1,000 mg Oral BID   Muscle Rub  1 application Topical TID   oxybutynin  10 mg Oral Daily   pantoprazole  40 mg Oral BID AC   polyethylene glycol  17 g Oral Daily   polyethylene glycol  34 g Oral Q2H   polyvinyl alcohol  2 drop Both Eyes TID   prednisoLONE acetate  1 drop Both Eyes QID   triamcinolone cream  1 application Topical BID   Continuous Infusions:  cefTRIAXone (ROCEPHIN)  IV 2 g (04/06/21 2130)     LOS: 4 days     Enzo Bi, MD [facility] If 7PM-7AM, please contact night-coverage 04/07/2021, 3:57 PM

## 2021-04-07 NOTE — Progress Notes (Signed)
PT Cancellation Note  Patient Details Name: Raymond Hays MRN: [id number] DOB: [birth year]   Cancelled Treatment:    Reason Eval/Treat Not Completed: PT screened, no needs identified, will sign off PT orders received, chart reviewed. Per previous PT notes pt is max assist +2 for bed mobility at baseline & relies on a hoyer lift for transfers. No acute PT needs identified at this time, PT to sign off.  Aleda Grana, PT, DPT 04/07/21, 2:22 PM    Sandi Mariscal 04/07/2021, 2:21 PM

## 2021-04-07 NOTE — Progress Notes (Signed)
°   04/07/21 1635  Assess: MEWS Score  Temp 98.4 F (36.9 C)  BP (!) 88/61  ECG Heart Rate (!) 106  Resp 20  Level of Consciousness Alert  Assess: if the MEWS score is Yellow or Red  Were vital signs taken at a resting state? Yes  Focused Assessment Change from prior assessment (see assessment flowsheet)  Does the patient meet 2 or more of the SIRS criteria? No  Does the patient have a confirmed or suspected source of infection? Yes  Provider and Rapid Response Notified? No  MEWS guidelines implemented *See Row Information* Yes  Treat  Pain Scale 0-10  Pain Score 0  Take Vital Signs  Increase Vital Sign Frequency  Yellow: Q 2hr X 2 then Q 4hr X 2, if remains yellow, continue Q 4hrs  Escalate  MEWS: Escalate Yellow: discuss with charge nurse/RN and consider discussing with provider and RRT  Notify: Charge Nurse/RN  Name of Charge Nurse/RN Notified Shanda Bumps  Date Charge Nurse/RN Notified 04/07/21  Time Charge Nurse/RN Notified 1652  Notify: Provider  Provider Name/Title Dr. Fran Lowes  Date Provider Notified 04/07/21  Time Provider Notified 1652  Notification Type Page  Notification Reason Other (Comment) (MEWs yellow)  Provider response Evaluate remotely  Date of Provider Response 04/07/21  Time of Provider Response 1653  Document  Patient Outcome Other (Comment) (monitoring)  Progress note created (see row info) Yes  Assess: SIRS CRITERIA  SIRS Temperature  0  SIRS Pulse 1  SIRS Respirations  0  SIRS WBC 0  SIRS Score Sum  1   Will continue to monitor patient.

## 2021-04-08 ENCOUNTER — Inpatient Hospital Stay: Payer: No Typology Code available for payment source

## 2021-04-08 LAB — CBC
HCT: 33 % — ABNORMAL LOW (ref 39.0–52.0)
Hemoglobin: 11 g/dL — ABNORMAL LOW (ref 13.0–17.0)
MCH: 31 pg (ref 26.0–34.0)
MCHC: 33.3 g/dL (ref 30.0–36.0)
MCV: 93 fL (ref 80.0–100.0)
Platelets: 207 10*3/uL (ref 150–400)
RBC: 3.55 MIL/uL — ABNORMAL LOW (ref 4.22–5.81)
RDW: 16.6 % — ABNORMAL HIGH (ref 11.5–15.5)
WBC: 8.9 10*3/uL (ref 4.0–10.5)
nRBC: 0 % (ref 0.0–0.2)

## 2021-04-08 LAB — BASIC METABOLIC PANEL
Anion gap: 8 (ref 5–15)
BUN: 31 mg/dL — ABNORMAL HIGH (ref 8–23)
CO2: 25 mmol/L (ref 22–32)
Calcium: 8.6 mg/dL — ABNORMAL LOW (ref 8.9–10.3)
Chloride: 103 mmol/L (ref 98–111)
Creatinine, Ser: 0.81 mg/dL (ref 0.61–1.24)
GFR, Estimated: >60 mL/min (ref >60)
Glucose, Bld: 102 mg/dL — ABNORMAL HIGH (ref 70–99)
Potassium: 3.4 mmol/L — ABNORMAL LOW (ref 3.5–5.1)
Sodium: 136 mmol/L (ref 135–145)

## 2021-04-08 LAB — CULTURE, BLOOD (ROUTINE X 2)
Culture: NO GROWTH
Special Requests: ADEQUATE

## 2021-04-08 LAB — MAGNESIUM: Magnesium: 2.1 mg/dL (ref 1.7–2.4)

## 2021-04-08 MED ORDER — FUROSEMIDE 10 MG/ML IJ SOLN
40.0000 mg | Freq: Once | INTRAMUSCULAR | Status: AC
  Administered 2021-04-08: 40 mg via INTRAVENOUS

## 2021-04-08 MED ORDER — SENNOSIDES-DOCUSATE SODIUM 8.6-50 MG PO TABS
1.0000 | ORAL_TABLET | Freq: Two times a day (BID) | ORAL | Status: DC
  Administered 2021-04-08 – 2021-04-09 (×3): 1 via ORAL
  Filled 2021-04-08 (×3): qty 1

## 2021-04-08 MED ORDER — POTASSIUM CHLORIDE 20 MEQ PO PACK
40.0000 meq | PACK | Freq: Once | ORAL | Status: AC
  Administered 2021-04-08: 40 meq via ORAL
  Filled 2021-04-08: qty 2

## 2021-04-08 MED ORDER — ORAL CARE MOUTH RINSE
15.0000 mL | Freq: Two times a day (BID) | OROMUCOSAL | Status: DC
  Administered 2021-04-08 – 2021-04-09 (×3): 15 mL via OROMUCOSAL

## 2021-04-08 MED ORDER — FUROSEMIDE 10 MG/ML IJ SOLN
40.0000 mg | Freq: Once | INTRAMUSCULAR | Status: AC
  Administered 2021-04-08: 40 mg via INTRAVENOUS
  Filled 2021-04-08: qty 4

## 2021-04-08 MED ORDER — CHLORHEXIDINE GLUCONATE 0.12 % MT SOLN
15.0000 mL | Freq: Two times a day (BID) | OROMUCOSAL | Status: DC
  Administered 2021-04-08: 15 mL via OROMUCOSAL
  Filled 2021-04-08 (×2): qty 15

## 2021-04-08 MED ORDER — METOCLOPRAMIDE HCL 10 MG PO TABS
10.0000 mg | ORAL_TABLET | Freq: Three times a day (TID) | ORAL | Status: DC
  Administered 2021-04-08 – 2021-04-09 (×3): 10 mg via ORAL
  Filled 2021-04-08 (×3): qty 1

## 2021-04-08 MED ORDER — POLYETHYLENE GLYCOL 3350 17 G PO PACK
17.0000 g | PACK | Freq: Two times a day (BID) | ORAL | Status: DC
  Administered 2021-04-08 – 2021-04-09 (×2): 17 g via ORAL
  Filled 2021-04-08: qty 1

## 2021-04-08 NOTE — Progress Notes (Signed)
PROGRESS NOTE    Raymond Hays  YKD:[id number] DOB: [birth year] DOA: 04/03/2021 PCP: [facility]  [id number]/[id number]   Assessment & Plan:   Principal Problem:   Acute respiratory failure (HCC)   Raymond Hays is a 76 y.o. Caucasian male with medical history significant for COPD, type 2 diabetes mellitus, hypertension, Parkinson's disease, paroxysmal atrial fibrillation and spinal stenosis, who presented to the ER with acute onset of altered mental status with significantly diminished responsiveness.  The patient was noted at his assisted living facility to be minimally responsive to painful stimuli with hypotension, fever and hypoxia.    1.  Acute hypoxic respiratory failure like secondary to  left-sided Community-acquired pneumonia --placed on BiPAP, weaned to 4L [place], however, intermittently needed to be put back on BiPAP - started on IV Rocephin, Zithromax and IV Vanc on admission.   IV Vanc not continued. --completed 5 days of ceftriaxone and azithro Plan: --Mucomyst neb BID for mucus clearance --DuoNeb --Continue supplemental O2 to keep sats >=92%, wean as tolerated  Diffuse dilatation of large and small bowel with stool impaction, chronic Likely rectal atony  --progressive since at least Nov.  Large amount of stool in the rectum also seen in all abdominal imaging since Oct. --severe abdominal distention likely contributed to pt's respiratory distress by inhibiting lung expansion. --pt also appeared to have lost sensation in his rectum and his ability to have a BM or control BM.  Discussed with GI Dr. Tobi Bastos, who will see pt in outpatient clinic for Anal manometry. Plan: --cont Miralax BID to ensure soft BM --start reglan, per GI rec --start Senna to stimulate bowel movement  AKI --Cr 2.64 on presentation.  Baseline around 1.  Improved with IVF. --oral hydration now  Bilateral LE edema and scrotal swelling --apply ACE wraps to bilateral LE --trial IV lasix 40 x1 on 1/7 due to  weeping from the thigh, with good results and actual improvement in Cr. --repeat IV lasix today  2.  Paroxysmal atrial fibrillation with rapid ventricular response. --cont home oral amiodarone --cont home Eliquis --hold home metop due to low BP  3.  Parkinson's disease. - We will continue Sinemet IR.  4.  Depression. - We will continue Lexapro.  5.  BPH. - We will continue Proscar.  6.  Dyslipidemia. --cont statin  7.  Type 2 diabetes mellitus with peripheral neuropathy. --BG has been within inpatient goal --d/c'ed BG checks and SSI --cont gabapentin --hold metformin  8.  COPD without exacerbation. - We will place him on scheduled and as needed DuoNebs. --cont home Trelegy Ellipta   DVT prophylaxis: [id number] Code Status: DNR  Family Communication:  Level of care: Progressive Dispo:   The patient is from: SNF Anticipated d/c is to: SNF Anticipated d/c date is: likely Monday  Patient currently is medically ready to d/c.   Subjective and Interval History:  Pt responded well to IV lasix 40 yesterday, with improvement in swelling and Cr.  Pt said he is ready to leave the hospital.   Objective: Vitals:   04/07/21 2325 04/08/21 0412 04/08/21 0730 04/08/21 0734  BP: 96/65 101/80 101/61   Pulse: 96 (!) 108 (!) 55   Resp: 18 18 20    Temp: (!) 97.5 F (36.4 C) (!) 97.5 F (36.4 C) 98.3 F (36.8 C)   TempSrc: Oral Oral    SpO2: 99%  93% 94%  Weight:      Height:        Intake/Output Summary (Last 24 hours) at 04/08/2021  1450 Last data filed at 04/08/2021 1300 Gross per 24 hour  Intake 1190.36 ml  Output 1250 ml  Net -59.64 ml   Filed Weights   04/03/21 1947 04/05/21 0300  Weight: 102 kg 106.7 kg    Examination:   Constitutional: NAD, AAOx3 HEENT: conjunctivae and lids normal, EOMI CV: No cyanosis.   RESP: normal respiratory effort, on 4L Extremities: edema in thighs.  ACE warp over both lower legs. Neuro: II - XII grossly intact.   Psych:  depressed mood and affect.    Data Reviewed: I have personally reviewed following labs and imaging studies  CBC: Recent Labs  Lab 04/03/21 1953 04/04/21 0651 04/05/21 0729 04/06/21 0550 04/07/21 0422 04/08/21 0502  WBC 9.1 8.4 9.2 9.0 10.9* 8.9  NEUTROABS 7.6  --   --   --   --   --   HGB 13.9 11.4* 11.6* 11.5* 11.3* 11.0*  HCT 43.1 34.9* 34.9* 34.8* 34.4* 33.0*  MCV 94.9 96.1 94.3 94.3 94.2 93.0  PLT 398 267 237 202 184 207   Basic Metabolic Panel: Recent Labs  Lab 04/05/21 0029 04/05/21 0729 04/06/21 0550 04/07/21 0422 04/08/21 0502  NA 135 135 136 133* 136  K 4.0 3.5 3.7 4.2 3.4*  CL 101 100 105 101 103  CO2 23 20* 21* 22 25  GLUCOSE 130* 98 100* 90 102*  BUN 42* 42* 43* 40* 31*  CREATININE 1.65* 1.45* 1.40* 1.27* 0.81  CALCIUM 8.6* 8.4* 8.3* 8.4* 8.6*  MG 2.3 2.1 2.1 2.1 2.1   GFR: Estimated Creatinine Clearance: 90.4 mL/min (by C-G formula based on SCr of 0.81 mg/dL). Liver Function Tests: Recent Labs  Lab 04/05/21 0029  AST 61*  ALT 11  ALKPHOS 84  BILITOT 1.1  PROT 7.0  ALBUMIN 2.9*   No results for input(s): LIPASE, AMYLASE in the last 168 hours. No results for input(s): AMMONIA in the last 168 hours. Coagulation Profile: No results for input(s): INR, PROTIME in the last 168 hours. Cardiac Enzymes: No results for input(s): CKTOTAL, CKMB, CKMBINDEX, TROPONINI in the last 168 hours. BNP (last 3 results) No results for input(s): PROBNP in the last 8760 hours. HbA1C: No results for input(s): HGBA1C in the last 72 hours. CBG: Recent Labs  Lab 04/04/21 0802 04/04/21 1123 04/04/21 1621  GLUCAP 131* 114* 96   Lipid Profile: No results for input(s): CHOL, HDL, LDLCALC, TRIG, CHOLHDL, LDLDIRECT in the last 72 hours. Thyroid Function Tests: No results for input(s): TSH, T4TOTAL, FREET4, T3FREE, THYROIDAB in the last 72 hours. Anemia Panel: No results for input(s): VITAMINB12, FOLATE, FERRITIN, TIBC, IRON, RETICCTPCT in the last 72 hours. Sepsis  Labs: Recent Labs  Lab 04/03/21 2030 04/04/21 0113 04/04/21 0651  PROCALCITON  --   --  0.80  LATICACIDVEN 2.8* 1.6  --     Recent Results (from the past 240 hour(s))  Blood culture (routine x 2)     Status: None   Collection Time: 04/03/21  8:30 PM   Specimen: BLOOD  Result Value Ref Range Status   Specimen Description BLOOD LEFT ANTECUBITAL  Final   Special Requests   Final    BOTTLES DRAWN AEROBIC AND ANAEROBIC Blood Culture adequate volume   Culture   Final    NO GROWTH 5 DAYS Performed at [facility], [address] [place], [place] [address]    Report Status 04/08/2021 FINAL  Final  Resp Panel by RT-PCR (Flu A&B, Covid) Nasopharyngeal Swab     Status: None  Collection Time: 04/04/21  9:26 AM   Specimen: Nasopharyngeal Swab; Nasopharyngeal(NP) swabs in vial transport medium  Result Value Ref Range Status   SARS Coronavirus 2 by RT PCR NEGATIVE NEGATIVE Final    Comment: (NOTE) SARS-CoV-2 target nucleic acids are NOT DETECTED.  The SARS-CoV-2 RNA is generally detectable in upper respiratory specimens during the acute phase of infection. The lowest concentration of SARS-CoV-2 viral copies this assay can detect is 138 copies/mL. A negative result does not preclude SARS-Cov-2 infection and should not be used as the sole basis for treatment or other patient management decisions. A negative result may occur with  improper specimen collection/handling, submission of specimen other than nasopharyngeal swab, presence of viral mutation(s) within the areas targeted by this assay, and inadequate number of viral copies(<138 copies/mL). A negative result must be combined with clinical observations, patient history, and epidemiological information. The expected result is Negative.  Fact Sheet for Patients:  BloggerCourse.comhttps://www.fda.gov/media/152166/download  Fact Sheet for Healthcare Providers:  SeriousBroker.ithttps://www.fda.gov/media/152162/download  This test is no t yet approved or  cleared by the [place] FDA and  has been authorized for detection and/or diagnosis of SARS-CoV-2 by FDA under an Emergency Use Authorization (EUA). This EUA will remain  in effect (meaning this test can be used) for the duration of the COVID-19 declaration under Section 564(b)(1) of the Act, 21 U.S.C.section 360bbb-3(b)(1), unless the authorization is terminated  or revoked sooner.       Influenza A by PCR NEGATIVE NEGATIVE Final   Influenza B by PCR NEGATIVE NEGATIVE Final    Comment: (NOTE) The Xpert Xpress SARS-CoV-2/FLU/RSV plus assay is intended as an aid in the diagnosis of influenza from Nasopharyngeal swab specimens and should not be used as a sole basis for treatment. Nasal washings and aspirates are unacceptable for Xpert Xpress SARS-CoV-2/FLU/RSV testing.  Fact Sheet for Patients: BloggerCourse.comhttps://www.fda.gov/media/152166/download  Fact Sheet for Healthcare Providers: SeriousBroker.ithttps://www.fda.gov/media/152162/download  This test is not yet approved or cleared by the [place] FDA and has been authorized for detection and/or diagnosis of SARS-CoV-2 by FDA under an Emergency Use Authorization (EUA). This EUA will remain in effect (meaning this test can be used) for the duration of the COVID-19 declaration under Section 564(b)(1) of the Act, 21 U.S.C. section 360bbb-3(b)(1), unless the authorization is terminated or revoked.  Performed at [facility], [address] [place], [place] [address]       Radiology Studies: DG Abd 1 View  Result Date: 04/08/2021 CLINICAL DATA:  Abdominal distension EXAM: ABDOMEN - 1 VIEW COMPARISON:  April 06, 2021 FINDINGS: Continued distension of large and small bowel, stable to mildly increased in the interval. IMPRESSION: Small and large bowel distension, mildly increased versus stable in the interval. Findings likely represent ileus. Electronically Signed   By: Gerome Samavid  Williams III M.D.   On: 04/08/2021 11:52   DG Abd 1  View  Result Date: 04/06/2021 CLINICAL DATA:  Abdominal distension EXAM: ABDOMEN - 1 VIEW COMPARISON:  04/05/2021 FINDINGS: 2 supine frontal views of the abdomen and pelvis demonstrate diffuse gaseous distension of the large and small bowel, with decreased colonic gas since prior study. Decreased stool burden within the rectal vault. No masses or abnormal calcifications. Lung bases are clear. IMPRESSION: 1. Diffuse gaseous distension of the large and small bowel, with decreased colonic gas and rectal stool burden since prior study. Findings could reflect resolving ileus or improvement after disimpaction. Continued radiographic follow-up recommended. Electronically Signed   By: Sharlet SalinaMichael  Brown M.D.   On: 04/06/2021 19:33  Scheduled Meds:  acetylcysteine  4 mL Nebulization BID   amiodarone  200 mg Oral Daily   apixaban  5 mg Oral BID   atorvastatin  20 mg Oral QHS   carbidopa-levodopa  1 tablet Oral TID AC   chlorhexidine  15 mL Mouth Rinse BID   escitalopram  5 mg Oral Daily   feeding supplement (GLUCERNA SHAKE)  237 mL Oral BID BM   finasteride  5 mg Oral Daily   fluticasone  1 spray Each Nare Daily   fluticasone furoate-vilanterol  1 puff Inhalation Daily   And   umeclidinium bromide  1 puff Inhalation Daily   gabapentin  300 mg Oral BID   ipratropium  2 spray Each Nare BID   ipratropium-albuterol  3 mL Nebulization BID   lactase  3,000 Units Oral TID WC   mouth rinse  15 mL Mouth Rinse q12n4p   methocarbamol  1,000 mg Oral BID   Muscle Rub  1 application Topical TID   oxybutynin  10 mg Oral Daily   pantoprazole  40 mg Oral BID AC   polyethylene glycol  17 g Oral BID   polyvinyl alcohol  2 drop Both Eyes TID   prednisoLONE acetate  1 drop Both Eyes QID   senna-docusate  1 tablet Oral BID   triamcinolone cream  1 application Topical BID   Continuous Infusions:     LOS: 5 days     [name], MD [facility] If 7PM-7AM, please contact night-coverage 04/08/2021, 2:50 PM

## 2021-04-09 ENCOUNTER — Telehealth: Payer: Self-pay

## 2021-04-09 LAB — BASIC METABOLIC PANEL
Anion gap: 7 (ref 5–15)
BUN: 23 mg/dL (ref 8–23)
CO2: 27 mmol/L (ref 22–32)
Calcium: 8.2 mg/dL — ABNORMAL LOW (ref 8.9–10.3)
Chloride: 106 mmol/L (ref 98–111)
Creatinine, Ser: 0.64 mg/dL (ref 0.61–1.24)
GFR, Estimated: >60 mL/min (ref >60)
Glucose, Bld: 103 mg/dL — ABNORMAL HIGH (ref 70–99)
Potassium: 3.5 mmol/L (ref 3.5–5.1)
Sodium: 140 mmol/L (ref 135–145)

## 2021-04-09 LAB — MAGNESIUM: Magnesium: 1.7 mg/dL (ref 1.7–2.4)

## 2021-04-09 LAB — CBC
HCT: 32.7 % — ABNORMAL LOW (ref 39.0–52.0)
Hemoglobin: 10.6 g/dL — ABNORMAL LOW (ref 13.0–17.0)
MCH: 30.5 pg (ref 26.0–34.0)
MCHC: 32.4 g/dL (ref 30.0–36.0)
MCV: 94.2 fL (ref 80.0–100.0)
Platelets: 196 10*3/uL (ref 150–400)
RBC: 3.47 MIL/uL — ABNORMAL LOW (ref 4.22–5.81)
RDW: 16.9 % — ABNORMAL HIGH (ref 11.5–15.5)
WBC: 8 10*3/uL (ref 4.0–10.5)
nRBC: 0.2 % (ref 0.0–0.2)

## 2021-04-09 MED ORDER — AMIODARONE HCL 200 MG PO TABS: 200.0000 mg | ORAL_TABLET | Freq: Every day | ORAL | Status: DC

## 2021-04-09 MED ORDER — POLYETHYLENE GLYCOL 3350 17 G PO PACK: 17.0000 g | PACK | Freq: Two times a day (BID) | ORAL | 0 refills | Status: DC

## 2021-04-09 MED ORDER — FUROSEMIDE 40 MG PO TABS: 40.0000 mg | ORAL_TABLET | Freq: Two times a day (BID) | ORAL | 0 refills | Status: DC

## 2021-04-09 MED ORDER — LIP MEDEX EX OINT: 1.0000 "application " | TOPICAL_OINTMENT | CUTANEOUS | 0 refills | Status: DC | PRN

## 2021-04-09 MED ORDER — METOPROLOL TARTRATE 25 MG PO TABS: ORAL_TABLET | ORAL | Status: DC

## 2021-04-09 MED ORDER — FUROSEMIDE 10 MG/ML IJ SOLN
40.0000 mg | Freq: Once | INTRAMUSCULAR | Status: AC
  Administered 2021-04-09: 40 mg via INTRAVENOUS
  Filled 2021-04-09: qty 4

## 2021-04-09 MED ORDER — IPRATROPIUM-ALBUTEROL 0.5-2.5 (3) MG/3ML IN SOLN: 3.0000 mL | Freq: Four times a day (QID) | RESPIRATORY_TRACT | Status: DC | PRN

## 2021-04-09 MED ORDER — METOCLOPRAMIDE HCL 10 MG PO TABS: 10.0000 mg | ORAL_TABLET | Freq: Three times a day (TID) | ORAL | 0 refills | Status: DC

## 2021-04-09 NOTE — Discharge Summary (Addendum)
Physician Discharge Summary   Kennie Garino  male DOB: 02-Jun-1945  [id number]  PCP: [facility]  Admit date: 04/03/2021 Discharge date: 04/09/2021  Admitted From: SNF Disposition:  SNF CODE STATUS: DNR  Discharge Instructions     No wound care   Complete by: As directed         Hospital Course:  For full details, please see H&P, progress notes, consult notes and ancillary notes.  Briefly,  Waring Woolard is a 76 y.o. Caucasian male with medical history significant for COPD, type 2 diabetes mellitus, hypertension, Parkinson's disease, paroxysmal atrial fibrillation and spinal stenosis, who presented to the ER with acute onset of altered mental status with significantly diminished responsiveness.  The patient was noted at his LTC SNF to be minimally responsive to painful stimuli with hypotension, fever and hypoxia.    Acute hypoxic respiratory failure like secondary to  left-sided Community-acquired pneumonia --placed on BiPAP, weaned to 4L , however, intermittently needed to be put back on BiPAP for the first several days.  Discharged on 3L, to be weaned off as tolerated.  - started on IV Rocephin, Zithromax and IV Vanc on admission.   IV Vanc not continued. --completed 5 days of ceftriaxone and azithro --Mucomyst neb BID for mucus clearance while inpatient. --received DuoNeb scheduled. --Continue supplemental O2 to keep sats >=92%, wean as tolerated   Severe Sepsis, POA --presented with fever 101.2, tachycardia, tachypnea, with AKI, source PNA.  Diffuse dilatation of large and small bowel with stool impaction, chronic Likely rectal atony  --progressive since at least Nov 2022.  Large amount of stool in the rectum also seen in all abdominal imaging since Oct 2022. --severe abdominal distention likely contributed to pt's respiratory distress by inhibiting lung expansion. --pt also appeared to have lost sensation in his rectum and his ability to have a BM or control BM.   Discussed with GI Dr. Vicente Males, who will see pt in outpatient clinic for Anal manometry. --cont Miralax BID scheduled to ensure soft BM --cont home Metamucil daily --start reglan, per GI rec, will continue for 14 days. --pt will follow up with GI Dr. Vicente Males as outpatient.   AKI, POA --Cr 2.64 on presentation.  Baseline around 1.  Unclear if due to dehydration or congestion, because Cr initially improved with IVF, but then improved even more with IV lasix 40 mg BID for 2 days.  Cr 0.64 prior to discharge.   --pt discharged on oral lasix 40 mg BID for 7 days to remove congestion.  Advise checking Cr on 1/13 to ensure kidney function stable on diuretic.   Bilateral LE edema and scrotal swelling Ansarca --cont ACE wraps to bilateral LE --trial IV lasix 40 on 1/7 and 1/8 due to weeping from the thigh, with good results and actual improvement in Cr. --pt discharged on oral lasix 40 mg BID for 7 days to remove congestion.  Advise checking Cr on 1/13 to ensure kidney function stable on diuretic.  Paroxysmal atrial fibrillation with rapid ventricular response. --cont home oral amiodarone --cont home Eliquis --hold home metop due to low BP  Parkinson's disease. - continue Sinemet IR.  Depression. - continue Lexapro.  BPH. - continue Proscar.  Dyslipidemia. --cont statin  Type 2 diabetes mellitus, well controlled peripheral neuropathy. --A1c 6.8.  BG has been within inpatient goal --d/c'ed BG checks and SSI --cont gabapentin --metformin d/c'ed since no apparent need  COPD without exacerbation. --cont home Trelegy Ellipta --DuoNeb PRN  Suprapubic cath present --management and exchange  per PTA outpatient regimen --still have urine output from penis, due to incontinence.    Discharge Diagnoses:  Principal Problem:   Acute respiratory failure ([place])   30 Day Unplanned Readmission Risk Score    Flowsheet Row ED to Hosp-Admission (Current) from 04/03/2021 in [place]  30 Day Unplanned Readmission Risk Score (%) 40.25 Filed at 04/09/2021 0801       This score is the patient's risk of an unplanned readmission within 30 days of being discharged (0 -100%). The score is based on dignosis, age, lab data, medications, orders, and past utilization.   Low:  0-14.9   Medium: 15-21.9   High: 22-29.9   Extreme: 30 and above         Discharge Instructions:  Allergies as of 04/09/2021   No Known Allergies      Medication List     STOP taking these medications    bisacodyl 10 MG suppository Commonly known as: DULCOLAX   guaiFENesin 600 MG 12 hr tablet Commonly known as: MUCINEX   ipratropium 0.03 % nasal spray Commonly known as: ATROVENT   metFORMIN 500 MG 24 hr tablet Commonly known as: GLUCOPHAGE-XR   mineral oil enema       TAKE these medications    acetaminophen 325 MG tablet Commonly known as: TYLENOL Take 650 mg by mouth every 4 (four) hours as needed for mild pain.   Albuterol Sulfate 108 (90 Base) MCG/ACT Aepb Commonly known as: PROAIR RESPICLICK Inhale 2 puffs into the lungs every 4 (four) hours as needed (COPD).   amiodarone 200 MG tablet Commonly known as: PACERONE Take 1 tablet (200 mg total) by mouth daily. Start taking on: April 10, 2021 What changed:  medication strength See the new instructions.   apixaban 5 MG Tabs tablet Commonly known as: ELIQUIS Take 5 mg by mouth 2 (two) times daily.   atorvastatin 20 MG tablet Commonly known as: LIPITOR Take 20 mg by mouth at bedtime.   calcium carbonate 500 MG chewable tablet Commonly known as: TUMS - dosed in mg elemental calcium Chew 2 tablets by mouth every 6 (six) hours as needed for indigestion or heartburn.   carbidopa-levodopa 25-100 MG tablet Commonly known as: SINEMET IR Take 1 tablet by mouth 3 (three) times daily before meals.   cetaphil cream Apply 1 application topically 2 (two) times daily as needed (dry skin). (Apply to entire body)    escitalopram 5 MG tablet Commonly known as: LEXAPRO Take 5 mg by mouth daily.   feeding supplement (GLUCERNA SHAKE) Liqd Take 237 mLs by mouth 2 (two) times daily between meals.   finasteride 5 MG tablet Commonly known as: PROSCAR Take 5 mg by mouth daily.   fluticasone 50 MCG/ACT nasal spray Commonly known as: FLONASE Place 1 spray into both nostrils daily.   furosemide 40 MG tablet Commonly known as: Lasix Take 1 tablet (40 mg total) by mouth 2 (two) times daily for 7 days. Start taking on: April 10, 2021   gabapentin 800 MG tablet Commonly known as: NEURONTIN Take 800 mg by mouth 2 (two) times daily.   hydrOXYzine 25 MG tablet Commonly known as: ATARAX Take 25 mg by mouth 2 (two) times daily as needed for itching.   ipratropium-albuterol 0.5-2.5 (3) MG/3ML Soln Commonly known as: DUONEB Inhale 3 mLs into the lungs every 6 (six) hours as needed. What changed:  when to take this reasons to take this   lactase 3000 units tablet Commonly known  as: LACTAID Take 3,000 Units by mouth 3 (three) times daily with meals.   lip balm ointment Apply 1 application topically as needed for lip care.   Menthol (Topical Analgesic) 4 % Gel Apply 1 application topically 3 (three) times daily. (Apply to neck)   methocarbamol 500 MG tablet Commonly known as: ROBAXIN Take 1,000 mg by mouth in the morning and at bedtime.   metoCLOPramide 10 MG tablet Commonly known as: REGLAN Take 1 tablet (10 mg total) by mouth 3 (three) times daily before meals for 14 days.   metoprolol tartrate 25 MG tablet Commonly known as: LOPRESSOR Hold pending outpatient followup due to low blood pressure. What changed:  how much to take how to take this when to take this additional instructions   oxybutynin 10 MG 24 hr tablet Commonly known as: DITROPAN-XL Take 1 tablet (10 mg total) by mouth daily.   pantoprazole 40 MG tablet Commonly known as: PROTONIX Take 1 tablet (40 mg total) by mouth  2 (two) times daily before a meal. For 8 weeks. What changed: additional instructions   polyethylene glycol 17 g packet Commonly known as: MIRALAX / GLYCOLAX Take 17 g by mouth 2 (two) times daily. What changed: when to take this   prednisoLONE acetate 1 % ophthalmic suspension Commonly known as: PRED FORTE Place 1 drop into both eyes 4 (four) times daily.   psyllium 58.6 % powder Commonly known as: METAMUCIL Take 1 packet by mouth daily.   selenium sulfide 1 % Lotn Commonly known as: SELSUN Apply 1 application topically 2 (two) times a week. (Tuesday and Friday)   Systane Balance 0.6 % Soln Generic drug: Propylene Glycol Place 2 drops into both eyes in the morning, at noon, and at bedtime.   Trelegy Ellipta 100-62.5-25 MCG/ACT Aepb Generic drug: Fluticasone-Umeclidin-Vilant Inhale 1 puff into the lungs daily.   triamcinolone cream 0.1 % Commonly known as: KENALOG Apply 1 application topically 2 (two) times daily.         [facility] Follow up.   Specialty: General Practice Why: Please call to make appoinment. Contact information: [place] [address] [phone]         Jonathon Bellows, MD. Go on 04/16/2021.   Specialty: Gastroenterology Why: 2:30pm Contact information: [place] [address] [phone]                 No Known Allergies   The results of significant diagnostics from this hospitalization (including imaging, microbiology, ancillary and laboratory) are listed below for reference.   Consultations:   Procedures/Studies: DG Chest 1 View  Result Date: 04/05/2021 CLINICAL DATA:  Dyspnea EXAM: CHEST  1 VIEW COMPARISON:  04/03/2021 FINDINGS: Lung volumes are small and there is resultant vascular crowding the hilar and bibasilar atelectasis. Biapical opacities are present likely representing vascular shadow given their symmetry. No focal consolidation. No  pneumothorax or pleural effusion. Mitral clips overlie the cardiac silhouette. Global cardiac size within normal limits. No acute bone abnormality. IMPRESSION: Pulmonary hypoinflation. Electronically Signed   By: Fidela Salisbury M.D.   On: 04/05/2021 00:35   DG Abd 1 View  Result Date: 04/08/2021 CLINICAL DATA:  Abdominal distension EXAM: ABDOMEN - 1 VIEW COMPARISON:  April 06, 2021 FINDINGS: Continued distension of large and small bowel, stable to mildly increased in the interval. IMPRESSION: Small and large bowel distension, mildly increased versus stable in the interval. Findings likely represent ileus. Electronically Signed  By: Dorise Bullion III M.D.   On: 04/08/2021 11:52   DG Abd 1 View  Result Date: 04/06/2021 CLINICAL DATA:  Abdominal distension EXAM: ABDOMEN - 1 VIEW COMPARISON:  04/05/2021 FINDINGS: 2 supine frontal views of the abdomen and pelvis demonstrate diffuse gaseous distension of the large and small bowel, with decreased colonic gas since prior study. Decreased stool burden within the rectal vault. No masses or abnormal calcifications. Lung bases are clear. IMPRESSION: 1. Diffuse gaseous distension of the large and small bowel, with decreased colonic gas and rectal stool burden since prior study. Findings could reflect resolving ileus or improvement after disimpaction. Continued radiographic follow-up recommended. Electronically Signed   By: Randa Ngo M.D.   On: 04/06/2021 19:33   DG Abd 1 View  Result Date: 04/05/2021 CLINICAL DATA:  Abdominal distension EXAM: ABDOMEN - 1 VIEW COMPARISON:  03/21/2021 FINDINGS: Diffuse dilatation of large and small bowel with progression. Large amount of stool in the rectum. No bowel wall edema. Bilateral hip replacement. IMPRESSION: Diffuse large and small bowel dilatation with progression from the prior study. Large amount of stool in the rectum. Findings most compatible with ileus. Electronically Signed   By: Franchot Gallo M.D.   On:  04/05/2021 14:28   DG Abd 1 View  Result Date: 03/21/2021 CLINICAL DATA:  Ileus, history of appendectomy EXAM: ABDOMEN - 1 VIEW COMPARISON:  Abdominal radiograph dated March 19, 2021 FINDINGS: Gaseous distention of multiple bowel loops in a nonobstructive pattern. The findings suggest ileus. Bilateral hip arthroplasty. 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 IMPRESSION: Gaseous distention of multiple bowel lobes likely representing ileus, unchanged. Electronically Signed   By: Keane Police D.O.   On: 03/21/2021 09:39   DG Abd 1 View  Result Date: 03/19/2021 CLINICAL DATA:  Shortness of breath EXAM: ABDOMEN - 1 VIEW COMPARISON:  01/31/2021 FINDINGS: Diffuse colonic gas with distension. Diffuse small bowel gas with less prominent distension. Mild retained stool mainly seen in the pelvis. Similar pattern was seen previously. No concerning mass effect or gas collection. Bilateral hip arthroplasty. IMPRESSION: Diffuse gaseous distension of bowel suggesting adynamic ileus, also seen previously. Electronically Signed   By: Jorje Guild M.D.   On: 03/19/2021 04:29   DG Chest Port 1 View  Result Date: 04/03/2021 CLINICAL DATA:  Shortness of breath. EXAM: PORTABLE CHEST 1 VIEW COMPARISON:  03/22/2021, chest CT 01/09/2021 FINDINGS: Lower lung volumes from prior exam limiting assessment. Cardiomegaly likely accentuated by portable technique. Surgical clips project over the left heart. Stable mediastinal contours with aortic atherosclerosis. Ill-defined left suprahilar opacity, not seen on prior exam. No pneumothorax or large pleural effusion. No evident pulmonary edema. No acute osseous abnormalities are seen. Gaseous distention of bowel in the upper abdomen. IMPRESSION: 1. Ill-defined left suprahilar opacity, not seen on prior exam. This may be due to overlapping vascular structures, atelectasis, or pneumonia in the appropriate clinical setting. 2. Low lung volumes.  Cardiomegaly likely accentuated by a hypoaeration. Electronically Signed   By: Keith Rake M.D.   On: 04/03/2021 20:11   DG Chest Port 1 View  Result Date: 03/22/2021 CLINICAL DATA:  Shortness of breath EXAM: PORTABLE CHEST 1 VIEW COMPARISON:  03/16/2021 FINDINGS: The heart size and mediastinal contours are within normal limits. Shallow inspiration with low lung volumes. Minimal atelectasis at the right lung base. No pleural effusion or pneumothorax. The visualized skeletal structures are unremarkable. IMPRESSION: Low lung volumes.  Minimal atelectasis at the right lung base. Electronically Signed  By: Macy Mis M.D.   On: 03/22/2021 11:10   DG Chest Portable 1 View  Result Date: 03/16/2021 CLINICAL DATA:  Weakness and hypoxia EXAM: PORTABLE CHEST 1 VIEW COMPARISON:  Chest radiograph 01/31/2021 FINDINGS: The heart is mildly enlarged, unchanged. The mediastinal contours are stable. Mitral valve clips are again noted. Lung volumes are low. There is no focal consolidation or pulmonary edema. There is no pleural effusion or pneumothorax. There is no acute osseous abnormality. IMPRESSION: No radiographic evidence of acute cardiopulmonary process. Electronically Signed   By: Valetta Mole M.D.   On: 03/16/2021 16:29      Labs: BNP (last 3 results) Recent Labs    01/09/21 1547 01/29/21 1758 02/02/21 0434  BNP 263.7* 232.9* [id number]   Basic Metabolic Panel: Recent Labs  Lab 04/05/21 0729 04/06/21 0550 04/07/21 0422 04/08/21 0502 04/09/21 0645  NA 135 136 133* 136 140  K 3.5 3.7 4.2 3.4* 3.5  CL 100 105 101 103 106  CO2 20* 21* 22 25 27   GLUCOSE 98 100* 90 102* 103*  BUN 42* 43* 40* 31* 23  CREATININE 1.45* 1.40* 1.27* 0.81 0.64  CALCIUM 8.4* 8.3* 8.4* 8.6* 8.2*  MG 2.1 2.1 2.1 2.1 1.7   Liver Function Tests: Recent Labs  Lab 04/05/21 0029  AST 61*  ALT 11  ALKPHOS 84  BILITOT 1.1  PROT 7.0  ALBUMIN 2.9*   No results for input(s): LIPASE, AMYLASE in the last 168  hours. No results for input(s): AMMONIA in the last 168 hours. CBC: Recent Labs  Lab 04/03/21 1953 04/04/21 0651 04/05/21 0729 04/06/21 0550 04/07/21 0422 04/08/21 0502 04/09/21 0645  WBC 9.1   < > 9.2 9.0 10.9* 8.9 8.0  NEUTROABS 7.6  --   --   --   --   --   --   HGB 13.9   < > 11.6* 11.5* 11.3* 11.0* 10.6*  HCT 43.1   < > 34.9* 34.8* 34.4* 33.0* 32.7*  MCV 94.9   < > 94.3 94.3 94.2 93.0 94.2  PLT 398   < > 237 202 184 207 196   < > = values in this interval not displayed.   Cardiac Enzymes: No results for input(s): CKTOTAL, CKMB, CKMBINDEX, TROPONINI in the last 168 hours. BNP: Invalid input(s): POCBNP CBG: Recent Labs  Lab 04/04/21 0802 04/04/21 1123 04/04/21 1621  GLUCAP 131* 114* 96   D-Dimer No results for input(s): DDIMER in the last 72 hours. Hgb A1c No results for input(s): HGBA1C in the last 72 hours. Lipid Profile No results for input(s): CHOL, HDL, LDLCALC, TRIG, CHOLHDL, LDLDIRECT in the last 72 hours. Thyroid function studies No results for input(s): TSH, T4TOTAL, T3FREE, THYROIDAB in the last 72 hours.  Invalid input(s): FREET3 Anemia work up No results for input(s): VITAMINB12, FOLATE, FERRITIN, TIBC, IRON, RETICCTPCT in the last 72 hours. Urinalysis    Component Value Date/Time   COLORURINE AMBER (A) 04/04/2021 0245   APPEARANCEUR CLOUDY (A) 04/04/2021 0245   LABSPEC 1.024 04/04/2021 0245   PHURINE 5.0 04/04/2021 0245   GLUCOSEU NEGATIVE 04/04/2021 0245   HGBUR MODERATE (A) 04/04/2021 0245   BILIRUBINUR SMALL (A) 04/04/2021 0245   KETONESUR 5 (A) 04/04/2021 0245   PROTEINUR 100 (A) 04/04/2021 0245   NITRITE NEGATIVE 04/04/2021 0245   LEUKOCYTESUR LARGE (A) 04/04/2021 0245   Sepsis Labs Invalid input(s): PROCALCITONIN,  WBC,  LACTICIDVEN Microbiology Recent Results (from the past 240 hour(s))  Blood culture (routine x 2)  Status: None   Collection Time: 04/03/21  8:30 PM   Specimen: BLOOD  Result Value Ref Range Status   Specimen  Description BLOOD LEFT ANTECUBITAL  Final   Special Requests   Final    BOTTLES DRAWN AEROBIC AND ANAEROBIC Blood Culture adequate volume   Culture   Final    NO GROWTH 5 DAYS Performed at [facility], [address] [place], [place] [address]    Report Status 04/08/2021 FINAL  Final  Resp Panel by RT-PCR (Flu A&B, Covid) Nasopharyngeal Swab     Status: None   Collection Time: 04/04/21  9:26 AM   Specimen: Nasopharyngeal Swab; Nasopharyngeal(NP) swabs in vial transport medium  Result Value Ref Range Status   SARS Coronavirus 2 by RT PCR NEGATIVE NEGATIVE Final    Comment: (NOTE) SARS-CoV-2 target nucleic acids are NOT DETECTED.  The SARS-CoV-2 RNA is generally detectable in upper respiratory specimens during the acute phase of infection. The lowest concentration of SARS-CoV-2 viral copies this assay can detect is 138 copies/mL. A negative result does not preclude SARS-Cov-2 infection and should not be used as the sole basis for treatment or other patient management decisions. A negative result may occur with  improper specimen collection/handling, submission of specimen other than nasopharyngeal swab, presence of viral mutation(s) within the areas targeted by this assay, and inadequate number of viral copies(<138 copies/mL). A negative result must be combined with clinical observations, patient history, and epidemiological information. The expected result is Negative.  Fact Sheet for Patients:  EntrepreneurPulse.com.au  Fact Sheet for Healthcare Providers:  IncredibleEmployment.be  This test is no t yet approved or cleared by the [place] FDA and  has been authorized for detection and/or diagnosis of SARS-CoV-2 by FDA under an Emergency Use Authorization (EUA). This EUA will remain  in effect (meaning this test can be used) for the duration of the COVID-19 declaration under Section 564(b)(1) of the Act, 21 U.S.C.section  360bbb-3(b)(1), unless the authorization is terminated  or revoked sooner.       Influenza A by PCR NEGATIVE NEGATIVE Final   Influenza B by PCR NEGATIVE NEGATIVE Final    Comment: (NOTE) The Xpert Xpress SARS-CoV-2/FLU/RSV plus assay is intended as an aid in the diagnosis of influenza from Nasopharyngeal swab specimens and should not be used as a sole basis for treatment. Nasal washings and aspirates are unacceptable for Xpert Xpress SARS-CoV-2/FLU/RSV testing.  Fact Sheet for Patients: EntrepreneurPulse.com.au  Fact Sheet for Healthcare Providers: IncredibleEmployment.be  This test is not yet approved or cleared by the [place] FDA and has been authorized for detection and/or diagnosis of SARS-CoV-2 by FDA under an Emergency Use Authorization (EUA). This EUA will remain in effect (meaning this test can be used) for the duration of the COVID-19 declaration under Section 564(b)(1) of the Act, 21 U.S.C. section 360bbb-3(b)(1), unless the authorization is terminated or revoked.  Performed at [facility], [address] [place], [place] [address]      Total time spend on discharging this patient, including the last patient exam, discussing the hospital stay, instructions for ongoing care as it relates to all pertinent caregivers, as well as preparing the medical discharge records, prescriptions, and/or referrals as applicable, is 35 minutes.    Enzo Bi, MD  [facility] 04/09/2021, 1:09 PM

## 2021-04-09 NOTE — Care Management Important Message (Signed)
Important Message  Patient Details  Name: Jamiah Raimer MRN: [id number] Date of Birth: 1945-10-23   Medicare Important Message Given:  Yes  Reviewed Medicare IM with patient via room phone due to isolation status.  Copy of Medicare IM to be delivered to patient's room via nursing staff.    Dannette Barbara 04/09/2021, 11:25 AM

## 2021-04-09 NOTE — Telephone Encounter (Signed)
Called [facility] twice. Once was sent to the appointment desk and the other to the nurse and the phone just rang and rang and was not able to leave a voicemail. However, since patient is still at the hospital, whenever he gets discharged, his appointment with Dr. Tobi Bastos will show up on his after summary visit sheet.

## 2021-04-09 NOTE — TOC Transition Note (Signed)
Transition of Care [facility]) - CM/SW Discharge Note   Patient Details  Name: Raymond Hays MRN: [id number] Date of Birth: [birth year]  [facility] CM/SW Contact:  Alberteen Sam, LCSW Phone Number: 04/09/2021, 10:28 AM   Clinical Narrative:     Patient will DC to: [facility] Anticipated DC date: 04/09/21 Family notified: sister Sales executive by: [facility]  Per MD patient ready for DC to [facility]. RN, patient, patient's family, and facility notified of DC. Discharge Summary sent to facility. RN given number for report   [phone] Room 313A. DC packet on chart. Ambulance transport requested for patient.  CSW signing off.  Pricilla Riffle, LCSW    Final next level of care: Skilled Nursing Facility Barriers to Discharge: No Barriers Identified   Patient Goals and CMS Choice Patient states their goals for this hospitalization and ongoing recovery are:: to go home CMS Medicare.gov Compare Post Acute Care list provided to:: Patient Choice offered to / list presented to : Patient  Discharge Placement              Patient chooses bed at: [facility] Patient to be transferred to facility by: [facility] Name of family member notified: sister [name] Patient and family notified of of transfer: 04/09/21  Discharge Plan and Services                                     Social Determinants of Health (SDOH) Interventions     Readmission Risk Interventions Readmission Risk Prevention Plan 01/12/2021 10/20/2020  Transportation Screening Complete Complete  PCP or Specialist Appt within 3-5 Days - Complete  HRI or [place] - Complete  Palliative Care Screening - Not Applicable  Medication Review (RN Care Manager) Complete Referral to Pharmacy  PCP or Specialist appointment within 3-5 days of discharge Complete -  [place] or Home Care Consult Complete -  SW Recovery Care/Counseling Consult Complete -  Palliative Care Screening Not Applicable -  [place] Complete -

## 2021-04-09 NOTE — Progress Notes (Signed)
Pt to be d/c to [facility], report called to [employer]. IV removed, awaiting EMS for transportation.

## 2021-04-09 NOTE — [place] FL2 (Signed)
[place] MEDICAID FL2 LEVEL OF CARE SCREENING TOOL     IDENTIFICATION  Patient Name: Raymond Hays Birthdate: 09/18/1945 Sex: male Admission Date (Current Location): 04/03/2021  [facility] and [place] Number:  Chiropodist and Address:  [facility], [address], [place], [place] [address]      Provider Number: [id number]  Attending Physician Name and Address:  [name], MD  Relative Name and Phone Number:  Eunice Blase (sister) [phone]    Current Level of Care: Hospital Recommended Level of Care: Skilled Nursing Facility Prior Approval Number:    Date Approved/Denied:   PASRR Number: [id number]  Discharge Plan: SNF    Current Diagnoses: Patient Active Problem List   Diagnosis Date Noted   Acute respiratory failure (HCC) 04/03/2021   E. coli UTI    Herpes zoster conjunctivitis of left eye    Severe sepsis with septic shock (HCC) 03/16/2021   Obstipation 01/30/2021   Anxiety 01/30/2021   Iron deficiency anemia    Edema leg    Respiratory failure (HCC) 01/09/2021   Ileus (HCC)    Neck pain    AKI (acute kidney injury) (HCC)    Type 2 diabetes mellitus with diabetic neuropathy, without long-term current use of insulin (HCC)    Parkinson disease (HCC)    Sepsis (HCC) 10/16/2020   Chronic respiratory failure with hypoxia (HCC) 01/10/2020   Impaired functional mobility, balance, gait, and endurance 09/15/2019   Catheter cystitis (HCC) 10/16/2018   COVID-19 virus detected 07/03/2018   Benign prostatic hyperplasia with lower urinary tract symptoms 08/06/2017   Osteoarthritis of right knee 06/19/2017   S/P orthopedic surgery, follow-up exam 10/25/2016   Tendon rupture of wrist, sequela 01/02/2016   Acute pain of right knee 08/03/2015   Calcific tendinitis of left shoulder 07/05/2015   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/05/2015   Gait difficulty 06/26/2015   Severe mitral regurgitation 06/05/2015   AF (paroxysmal atrial fibrillation)  (HCC) 05/31/2015   Chronic pain 05/31/2015   COPD with acute exacerbation (HCC) 05/31/2015   Essential hypertension 05/31/2015   Chronic pain of left wrist 04/24/2015   Arthritis of left wrist 04/24/2015   Rupture of extensor tendon of left hand 04/24/2015    Orientation RESPIRATION BLADDER Height & Weight     Self, Time, Situation, Place  O2 (3L nasal cannula) Incontinent, External catheter Weight: 235 lb 3.7 oz (106.7 kg) Height:  5\' 8"  (172.7 cm)  BEHAVIORAL SYMPTOMS/MOOD NEUROLOGICAL BOWEL NUTRITION STATUS      Incontinent Diet (see discharge summary)  AMBULATORY STATUS COMMUNICATION OF NEEDS Skin   Limited Assist Verbally Other (Comment) (open and weeping left thigh, open wound right foot)                       Personal Care Assistance Level of Assistance  Bathing, Feeding, Dressing, Total care Bathing Assistance: Limited assistance Feeding assistance: Independent Dressing Assistance: Limited assistance Total Care Assistance: Limited assistance   Functional Limitations Info  Sight, Hearing, Speech Sight Info: Impaired Hearing Info: Impaired Speech Info: Adequate    SPECIAL CARE FACTORS FREQUENCY  PT (By licensed PT), OT (By licensed OT)                    Contractures Contractures Info: Not present    Additional Factors Info  Code Status, Allergies Code Status Info: DNR Allergies Info: No Known Allergies           Current Medications (04/09/2021):  This  is the current hospital active medication list Current Facility-Administered Medications  Medication Dose Route Frequency Provider Last Rate Last Admin   acetaminophen (TYLENOL) tablet 650 mg  650 mg Oral Q6H PRN Mansy, Jan A, MD   650 mg at 04/08/21 0841   Or   acetaminophen (TYLENOL) suppository 650 mg  650 mg Rectal Q6H PRN Mansy, Jan A, MD       acetylcysteine (MUCOMYST) 20 % nebulizer / oral solution 4 mL  4 mL Nebulization BID Darlin PriestlyLai, Tina, MD   4 mL at 04/09/21 0749   amiodarone (PACERONE)  tablet 200 mg  200 mg Oral Daily Sharen HonesDolan, Carissa E, RPH   200 mg at 04/09/21 [id number]   apixaban (ELIQUIS) tablet 5 mg  5 mg Oral BID Mansy, Jan A, MD   5 mg at 04/09/21 0945   atorvastatin (LIPITOR) tablet 20 mg  20 mg Oral QHS Mansy, Jan A, MD   20 mg at 04/08/21 2224   bisacodyl (DULCOLAX) suppository 10 mg  10 mg Rectal Daily PRN Mansy, Jan A, MD       calcium carbonate (TUMS - dosed in mg elemental calcium) chewable tablet 400 mg of elemental calcium  2 tablet Oral Q6H PRN Mansy, Jan A, MD       carbidopa-levodopa (SINEMET IR) 25-100 MG per tablet immediate release 1 tablet  1 tablet Oral TID AC Mansy, Vernetta HoneyJan A, MD   1 tablet at 04/09/21 0958   chlorhexidine (PERIDEX) 0.12 % solution 15 mL  15 mL Mouth Rinse BID Darlin PriestlyLai, Tina, MD   15 mL at 04/08/21 0842   chlorpheniramine-HYDROcodone (TUSSIONEX) 10-8 MG/5ML suspension 5 mL  5 mL Oral Q12H PRN Mansy, Jan A, MD   5 mL at 04/08/21 0841   escitalopram (LEXAPRO) tablet 5 mg  5 mg Oral Daily Mansy, Jan A, MD   5 mg at 04/09/21 0956   feeding supplement (GLUCERNA SHAKE) (GLUCERNA SHAKE) liquid 237 mL  237 mL Oral BID BM Mansy, Jan A, MD   237 mL at 04/08/21 1422   finasteride (PROSCAR) tablet 5 mg  5 mg Oral Daily Mansy, Jan A, MD   5 mg at 04/09/21 0945   fluticasone (FLONASE) 50 MCG/ACT nasal spray 1 spray  1 spray Each Nare Daily Mansy, Jan A, MD   1 spray at 04/09/21 0948   fluticasone furoate-vilanterol (BREO ELLIPTA) 100-25 MCG/ACT 1 puff  1 puff Inhalation Daily Darlin PriestlyLai, Tina, MD   1 puff at 04/09/21 0946   And   umeclidinium bromide (INCRUSE ELLIPTA) 62.5 MCG/ACT 1 puff  1 puff Inhalation Daily Darlin PriestlyLai, Tina, MD   1 puff at 04/09/21 0950   gabapentin (NEURONTIN) capsule 300 mg  300 mg Oral BID Sharen HonesDolan, Carissa E, RPH   300 mg at 04/09/21 0945   hydrOXYzine (ATARAX) tablet 25 mg  25 mg Oral BID PRN Mansy, Jan A, MD       ipratropium (ATROVENT) 0.03 % nasal spray 2 spray  2 spray Each Nare BID Mansy, Jan A, MD   2 spray at 04/09/21 0951   ipratropium-albuterol  (DUONEB) 0.5-2.5 (3) MG/3ML nebulizer solution 3 mL  3 mL Nebulization Q4H PRN Mansy, Jan A, MD   3 mL at 04/04/21 2350   ipratropium-albuterol (DUONEB) 0.5-2.5 (3) MG/3ML nebulizer solution 3 mL  3 mL Nebulization BID Darlin PriestlyLai, Tina, MD   3 mL at 04/09/21 0746   lactase (LACTAID) tablet 3,000 Units  3,000 Units Oral TID WC Mansy, Vernetta HoneyJan A, MD   3,000 Units  at 04/09/21 0957   lip balm (CARMEX) ointment 1 application  1 application Topical PRN [name], MD       magnesium hydroxide (MILK OF MAGNESIA) suspension 30 mL  30 mL Oral Daily PRN Mansy, Vernetta Honey, MD       MEDLINE mouth rinse  15 mL Mouth Rinse q12n4p [name], MD   15 mL at 04/08/21 1517   methocarbamol (ROBAXIN) tablet 1,000 mg  1,000 mg Oral BID Sharen Hones, RPH   1,000 mg at 04/09/21 0958   metoCLOPramide (REGLAN) tablet 10 mg  10 mg Oral TID Riley Lam, MD   10 mg at 04/09/21 0944   Muscle Rub CREA 1 application  1 application Topical TID Mansy, Jan A, MD   1 application at 04/09/21 0948   ondansetron (ZOFRAN) tablet 4 mg  4 mg Oral Q6H PRN Mansy, Jan A, MD       Or   ondansetron [facility]) injection 4 mg  4 mg Intravenous Q6H PRN Mansy, Jan A, MD       oxybutynin (DITROPAN-XL) 24 hr tablet 10 mg  10 mg Oral Daily Mansy, Jan A, MD   10 mg at 04/09/21 0958   pantoprazole (PROTONIX) EC tablet 40 mg  40 mg Oral BID AC Mansy, Jan A, MD   40 mg at 04/09/21 0944   polyethylene glycol (MIRALAX / GLYCOLAX) packet 17 g  17 g Oral BID [name], MD   17 g at 04/09/21 0946   polyvinyl alcohol (LIQUIFILM TEARS) 1.4 % ophthalmic solution 2 drop  2 drop Both Eyes TID Mansy, Jan A, MD   2 drop at 04/09/21 0948   prednisoLONE acetate (PRED FORTE) 1 % ophthalmic suspension 1 drop  1 drop Both Eyes QID Mansy, Jan A, MD   1 drop at 04/09/21 4656   senna-docusate (Senokot-S) tablet 1 tablet  1 tablet Oral BID [name], MD   1 tablet at 04/09/21 0945   traZODone (DESYREL) tablet 25 mg  25 mg Oral QHS PRN Mansy, Jan A, MD       triamcinolone cream (KENALOG)  0.1 % cream 1 application  1 application Topical BID Mansy, Vernetta Honey, MD   1 application at 04/09/21 8127     Discharge Medications: Please see discharge summary for a list of discharge medications.  Relevant Imaging Results:  Relevant Lab Results:   Additional Information SSN: [id number]  Gildardo Griffes, LCSW

## 2021-04-16 ENCOUNTER — Ambulatory Visit: Payer: No Typology Code available for payment source | Admitting: Gastroenterology

## 2021-11-19 ENCOUNTER — Other Ambulatory Visit: Payer: Self-pay | Admitting: Internal Medicine

## 2021-11-24 IMAGING — DX DG CHEST 1V PORT
1 series · 1 of 1 positions shown · non-contrast
Comparison: None.

CLINICAL DATA: Cough

EXAM:
PORTABLE CHEST 1 VIEW

[chest ap]
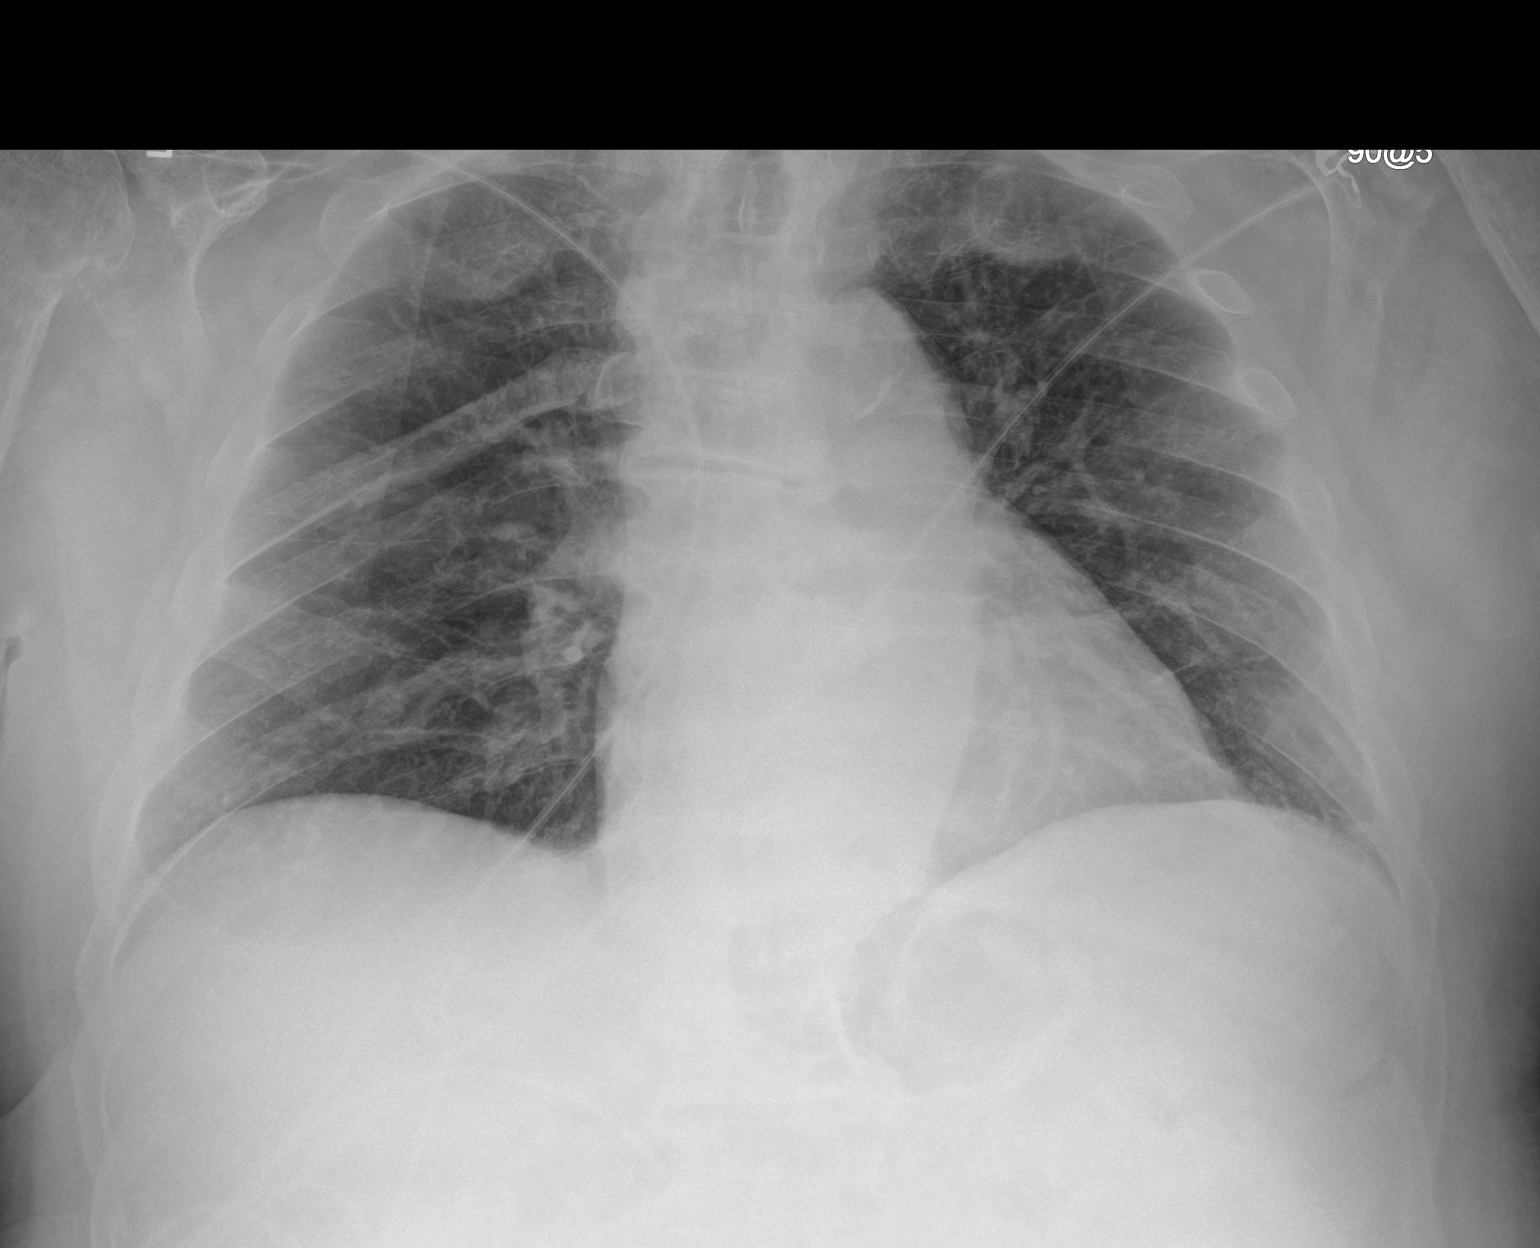

[1 of 1 positions shown; findings below may reference images not displayed]

FINDINGS: The heart size and mediastinal contours are within normal limits.
Both lungs are clear. The visualized skeletal structures are
unremarkable. Aortic atherosclerosis. Partially visualized hardware
at the cervical spine.
IMPRESSION: No active disease.

## 2021-12-05 ENCOUNTER — Emergency Department
Admission: EM | Admit: 2021-12-05 | Discharge: 2021-12-05 | Disposition: A | Discharge status: Home / Self Care | Payer: No Typology Code available for payment source | Attending: Emergency Medicine | Admitting: Emergency Medicine

## 2021-12-05 ENCOUNTER — Other Ambulatory Visit: Payer: Self-pay

## 2021-12-05 ENCOUNTER — Encounter: Payer: Self-pay | Admitting: Emergency Medicine

## 2021-12-05 DIAGNOSIS — J449 Chronic obstructive pulmonary disease, unspecified: Secondary | ICD-10-CM | POA: Diagnosis not present

## 2021-12-05 DIAGNOSIS — I10 Essential (primary) hypertension: Secondary | ICD-10-CM | POA: Insufficient documentation

## 2021-12-05 DIAGNOSIS — T83091A Other mechanical complication of indwelling urethral catheter, initial encounter: Secondary | ICD-10-CM | POA: Diagnosis present

## 2021-12-05 DIAGNOSIS — G20 Parkinson's disease: Secondary | ICD-10-CM | POA: Diagnosis not present

## 2021-12-05 DIAGNOSIS — Z8616 Personal history of COVID-19: Secondary | ICD-10-CM | POA: Insufficient documentation

## 2021-12-05 DIAGNOSIS — R319 Hematuria, unspecified: Secondary | ICD-10-CM | POA: Diagnosis not present

## 2021-12-05 DIAGNOSIS — Z7901 Long term (current) use of anticoagulants: Secondary | ICD-10-CM | POA: Diagnosis not present

## 2021-12-05 DIAGNOSIS — E1140 Type 2 diabetes mellitus with diabetic neuropathy, unspecified: Secondary | ICD-10-CM | POA: Diagnosis not present

## 2021-12-05 LAB — URINALYSIS, ROUTINE W REFLEX MICROSCOPIC
RBC / HPF: >50 RBC/hpf — ABNORMAL HIGH (ref 0–5)
Specific Gravity, Urine: 1.021 (ref 1.005–1.030)
Squamous Epithelial / HPF: NONE SEEN (ref 0–5)
WBC, UA: >50 WBC/hpf — ABNORMAL HIGH (ref 0–5)

## 2021-12-05 LAB — BASIC METABOLIC PANEL
Anion gap: 6 (ref 5–15)
BUN: 28 mg/dL — ABNORMAL HIGH (ref 8–23)
CO2: 25 mmol/L (ref 22–32)
Calcium: 8.8 mg/dL — ABNORMAL LOW (ref 8.9–10.3)
Chloride: 107 mmol/L (ref 98–111)
Creatinine, Ser: 1.15 mg/dL (ref 0.61–1.24)
GFR, Estimated: >60 mL/min (ref >60)
Glucose, Bld: 118 mg/dL — ABNORMAL HIGH (ref 70–99)
Potassium: 4.4 mmol/L (ref 3.5–5.1)
Sodium: 138 mmol/L (ref 135–145)

## 2021-12-05 LAB — CBC WITH DIFFERENTIAL/PLATELET
Abs Immature Granulocytes: 0.03 10*3/uL (ref 0.00–0.07)
Basophils Absolute: 0.1 10*3/uL (ref 0.0–0.1)
Basophils Relative: 1 %
Eosinophils Absolute: 0.3 10*3/uL (ref 0.0–0.5)
Eosinophils Relative: 3 %
HCT: 35.3 % — ABNORMAL LOW (ref 39.0–52.0)
Hemoglobin: 10.8 g/dL — ABNORMAL LOW (ref 13.0–17.0)
Immature Granulocytes: 0 %
Lymphocytes Relative: 18 %
Lymphs Abs: 1.5 10*3/uL (ref 0.7–4.0)
MCH: 25.5 pg — ABNORMAL LOW (ref 26.0–34.0)
MCHC: 30.6 g/dL (ref 30.0–36.0)
MCV: 83.5 fL (ref 80.0–100.0)
Monocytes Absolute: 0.6 10*3/uL (ref 0.1–1.0)
Monocytes Relative: 7 %
Neutro Abs: 5.9 10*3/uL (ref 1.7–7.7)
Neutrophils Relative %: 71 %
Platelets: 236 10*3/uL (ref 150–400)
RBC: 4.23 MIL/uL (ref 4.22–5.81)
RDW: 18.6 % — ABNORMAL HIGH (ref 11.5–15.5)
WBC: 8.3 10*3/uL (ref 4.0–10.5)
nRBC: 0 % (ref 0.0–0.2)

## 2021-12-05 MED ORDER — CEPHALEXIN 500 MG PO CAPS
500.0000 mg | ORAL_CAPSULE | Freq: Once | ORAL | Status: AC
  Administered 2021-12-05: 500 mg via ORAL
  Filled 2021-12-05: qty 1

## 2021-12-05 MED ORDER — CEPHALEXIN 500 MG PO CAPS: 500.0000 mg | ORAL_CAPSULE | Freq: Four times a day (QID) | ORAL | 0 refills | Status: DC

## 2021-12-05 MED ORDER — CEPHALEXIN 500 MG PO CAPS: 500.0000 mg | ORAL_CAPSULE | Freq: Four times a day (QID) | ORAL | 0 refills | Status: AC

## 2021-12-05 MED ORDER — OXYCODONE-ACETAMINOPHEN 5-325 MG PO TABS
1.0000 | ORAL_TABLET | Freq: Once | ORAL | Status: AC
  Administered 2021-12-05: 1 via ORAL
  Filled 2021-12-05: qty 1

## 2021-12-05 NOTE — ED Notes (Signed)
Blood and urine samples obtained while the patient was in triage

## 2021-12-05 NOTE — ED Notes (Signed)
Pt verbalized understanding of d/c instructions, prescription and follow up care. This RN also gave report to Okey Dupre, Charity fundraiser at [place] who also verbalized understanding of d/c instructions, prescription and follow up care.

## 2021-12-05 NOTE — ED Triage Notes (Signed)
FIRST RN: Pt from [facility] via ems with reports that pt had his suprapubic catheter exchanged out about 2 hours ago and since then has been having bloody urine. Pt reports bladder spasms. He is on eliquis.   CBG- 119 85Afib 112/71 100 RA

## 2021-12-05 NOTE — ED Provider Triage Note (Signed)
Emergency Medicine Provider Triage Evaluation Note  Raymond Hays , a 76 y.o. male  was evaluated in triage.  Pt complains of BPH with indwelling suprapubic catheter and h/o of catheter associated infection who presents today with pain at the catheter insertion site. Reports it was changed at [facility] just prior to arrival, and now it is bleeding. Reports very little urine coming out, and a lot of blood. No abdominal pain.   Patient Active Problem List   Diagnosis Date Noted   Acute respiratory failure (HCC) 04/03/2021   E. 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/27/2020   Chronic respiratory failure with hypoxia (HCC) 01/10/2020   Impaired functional mobility, balance, gait, and endurance 09/15/2019   Catheter cystitis (HCC) 10/16/2018   COVID-19 virus detected 07/03/2018   Benign prostatic hyperplasia with lower urinary tract symptoms 08/06/2017   Osteoarthritis of right knee 06/19/2017   S/P orthopedic surgery, follow-up exam 10/25/2016   Tendon rupture of wrist, sequela 01/02/2016   Acute pain of right knee 08/03/2015   Calcific tendinitis of left shoulder 07/05/2015   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/05/2015   Gait difficulty 06/26/2015   Severe mitral regurgitation 06/05/2015   AF (paroxysmal atrial fibrillation) (HCC) 05/31/2015   Chronic pain 05/31/2015   COPD with acute exacerbation (HCC) 05/31/2015   Essential hypertension 05/31/2015   Chronic pain of left wrist 04/24/2015   Arthritis of left wrist  04/24/2015   Rupture of extensor tendon of left hand 04/24/2015   .  Review of Systems  Positive: Catheter problem Negative: Abd pain, flank pain, fever  Physical Exam  There were no vitals taken for this visit. Gen:   Awake, no distress   Resp:  Normal effort  MSK:   Moves extremities without difficulty  Other:    Medical Decision Making  Medically screening exam initiated at 3:41 PM.  Appropriate orders placed.  Raymond Hays was informed that the remainder of the evaluation will be completed by another provider, this initial triage assessment does not replace that evaluation, and the importance of remaining in the ED until their evaluation is complete.     Jackelyn Hoehn, PA-C 12/05/21 1545

## 2021-12-05 NOTE — ED Provider Notes (Signed)
[facility] Provider Note    Event Date/Time   First MD Initiated Contact with Patient 12/05/21 1727     (approximate)   History   Suprapubic Cathetar Problem   HPI  Raymond Hays is a 76 y.o. male past medical history of chronic suprapubic catheter, paroxysmal A-fib on anticoagulation, COPD, diabetes, spinal stenosis who presents with hematuria.  Tells me that he has a chronic suprapubic Foley catheter.  It was exchanged today at his facility and it was painful and afterwards there was bleeding.  He notes that he has chronic pain and spasm of the bladder but is worse today.  Feels sensation like he needs to urinate but is unable to.  Does continue to pass urine through his urethra as well.  He denies fevers or chills denies back pain.    Past Medical History:  Diagnosis Date   Allergic rhinitis    Anxiety    Arthritis    Chronic indwelling Foley catheter    COPD (chronic obstructive pulmonary disease) (HCC)    Cough    Diabetes (HCC)    Essential (primary) hypertension    Hemorrhoid    Hyperlipidemia    PAF (paroxysmal atrial fibrillation) (HCC)    Primary osteoarthritis, unspecified shoulder    Retention of urine, unspecified    Spinal stenosis    UTI (urinary tract infection)     Patient Active Problem List   Diagnosis Date Noted   Acute respiratory failure (HCC) 04/03/2021   E. 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/27/2020   Chronic respiratory failure with hypoxia (HCC)  01/10/2020   Impaired functional mobility, balance, gait, and endurance 09/15/2019   Catheter cystitis (HCC) 10/16/2018   COVID-19 virus detected 07/03/2018   Benign prostatic hyperplasia with lower urinary tract symptoms 08/06/2017   Osteoarthritis of right knee 06/19/2017   S/P orthopedic surgery, follow-up exam 10/25/2016   Tendon rupture of wrist, sequela 01/02/2016   Acute pain of right knee 08/03/2015   Calcific tendinitis of left shoulder 07/05/2015   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/05/2015   Gait difficulty 06/26/2015   Severe mitral regurgitation 06/05/2015   AF (paroxysmal atrial fibrillation) (HCC) 05/31/2015   Chronic pain 05/31/2015   COPD with acute exacerbation (HCC) 05/31/2015   Essential hypertension 05/31/2015   Chronic pain of left wrist 04/24/2015   Arthritis of left wrist 04/24/2015   Rupture of extensor tendon of left hand 04/24/2015     Physical Exam  Triage Vital Signs: ED Triage Vitals  Enc Vitals Group     BP 12/05/21 1544 (!) 138/108     Pulse Rate 12/05/21 1544 74     Resp 12/05/21 1544 18     Temp 12/05/21 1544 98.3 F (36.8 C)     Temp Source 12/05/21 1544 Oral     SpO2 12/05/21 1544 100 %     Weight 12/05/21 1545 220 lb (99.8 kg)  Height 12/05/21 1545 5\' 7"  (1.702 m)     Head Circumference --      Peak Flow --      Pain Score 12/05/21 1544 5     Pain Loc --      Pain Edu? --      Excl. in GC? --     Most recent vital signs: Vitals:   12/05/21 1723 12/05/21 1830  BP: 115/81 122/76  Pulse: 72 69  Resp: 16 16  Temp:    SpO2: 97% 98%     General: Awake, no distress.  CV:  Good peripheral perfusion.  Resp:  Normal effort.  Abd:  No distention.  Suprapubic catheter in place, mild tenderness in the suprapubic region, abdomen soft, there is dark red hematuria in urine bag Neuro:             Awake, Alert, Oriented x 3  Other:     ED Results / Procedures / Treatments  Labs (all labs ordered are listed, but only abnormal  results are displayed) Labs Reviewed  URINALYSIS, ROUTINE W REFLEX MICROSCOPIC - Abnormal; Notable for the following components:      Result Value   Color, Urine RED (*)    APPearance TURBID (*)    Glucose, UA   (*)    Value: TEST NOT REPORTED DUE TO COLOR INTERFERENCE OF URINE PIGMENT   Hgb urine dipstick   (*)    Value: TEST NOT REPORTED DUE TO COLOR INTERFERENCE OF URINE PIGMENT   Bilirubin Urine   (*)    Value: TEST NOT REPORTED DUE TO COLOR INTERFERENCE OF URINE PIGMENT   Ketones, ur   (*)    Value: TEST NOT REPORTED DUE TO COLOR INTERFERENCE OF URINE PIGMENT   Protein, ur   (*)    Value: TEST NOT REPORTED DUE TO COLOR INTERFERENCE OF URINE PIGMENT   Nitrite   (*)    Value: TEST NOT REPORTED DUE TO COLOR INTERFERENCE OF URINE PIGMENT   Leukocytes,Ua   (*)    Value: TEST NOT REPORTED DUE TO COLOR INTERFERENCE OF URINE PIGMENT   RBC / HPF >50 (*)    WBC, UA >50 (*)    Bacteria, UA MANY (*)    All other components within normal limits  BASIC METABOLIC PANEL - Abnormal; Notable for the following components:   Glucose, Bld 118 (*)    BUN 28 (*)    Calcium 8.8 (*)    All other components within normal limits  CBC WITH DIFFERENTIAL/PLATELET - Abnormal; Notable for the following components:   Hemoglobin 10.8 (*)    HCT 35.3 (*)    MCH 25.5 (*)    RDW 18.6 (*)    All other components within normal limits     EKG     RADIOLOGY I performed and interpreted a bedside ultrasound of the bladder which shows that it is decompressed, Foley balloon in proper location   PROCEDURES:  Critical Care performed: No  .1-3 Lead EKG Interpretation  Performed by: 02/04/22, MD Authorized by: Georga Hacking, MD     Interpretation: normal     ECG rate assessment: normal     Rhythm: sinus rhythm     Ectopy: none     Conduction: normal     The patient is on the cardiac monitor to evaluate for evidence of arrhythmia and/or significant heart rate changes.   MEDICATIONS  ORDERED IN ED: Medications  cephALEXin (KEFLEX) capsule 500 mg (has no  administration in time range)  oxyCODONE-acetaminophen (PERCOCET/ROXICET) 5-325 MG per tablet 1 tablet (1 tablet Oral Given 12/05/21 1814)     IMPRESSION / MDM / ASSESSMENT AND PLAN / ED COURSE  I reviewed the triage vital signs and the nursing notes.                              Patient's presentation is most consistent with acute complicated illness / injury requiring diagnostic workup.  Differential diagnosis includes, but is not limited to, trauma from Foley placement, UTI, bladder spasm, side effect of anticoagulation  The patient is a 76 year old male presents with hematuria from suprapubic catheter.  He has had this catheter for quite some time due to neurogenic bladder.  Does pass some urine through his urethra as well.  It was changed at his facility today tells me he typically has an 74 [place] they put in a 8 [place] and he has had pain since and bleeding.  On exam he looks comfortable there is some mild tenderness in the suprapubic region.  There is fairly dark red urine in the bag with some clot.  I did a bedside ultrasound and bladder is decompressed Foley is in the right place.  Nursing flush the catheter and was able to get some clot.  I personally flushed the catheter with several 100 cc and was able to flush clot and what was draining was then fairly clear.  Foley bag was emptied and he was observed and urine remained pink-tinged and not frank blood.  Patient's labs are reassuring.  Hemoglobin is near baseline.  Mildly elevated 1.15 from baseline of around 0.8.  Patient feels improved after bladder was flushed.  Urine sample was sent from the patient's initial Foley bag is greater than 50 RBCs and WBCs which is not unexpected.  We will cover patient with a 5-day course of Keflex given the manipulation of the catheter today but do not think he has an acute UTI.  Given urine has cleared I think he is appropriate for  discharge.       FINAL CLINICAL IMPRESSION(S) / ED DIAGNOSES   Final diagnoses:  Hematuria, unspecified type     Rx / DC Orders   ED Discharge Orders          Ordered    cephALEXin (KEFLEX) 500 MG capsule  4 times daily        12/05/21 2025             Note:  This document was prepared using Dragon voice recognition software and may include unintentional dictation errors.   Georga Hacking, MD 12/05/21 2026

## 2021-12-05 NOTE — Discharge Instructions (Addendum)
Your Foley catheter was flushed and we were able to get some clot out and it was fully draining.  Your blood work was all reassuring.  We are starting you on an antibiotic just to prevent infection given the manipulation of the catheter.  Please take the antibiotic 4 times a day for the next 5 days.  If the urine becomes dark red again or there is clots or their Foley is not draining please return to the emergency department.

## 2021-12-05 NOTE — ED Notes (Signed)
Received verbal order from Dr. Sidney Ace to flush the patient's catheter. This RN flushed his catheter as directed and it was very bloody with large blood clots. Dr. Sidney Ace informed.

## 2021-12-05 NOTE — ED Triage Notes (Signed)
Patient to ED via [facility] from [facility] for suprapubic catheter problem. Patient states it was exchanged by nurses at [facility] approx 2 hours PTA and has been having bloody urine and pain since. Patient bed bound at baseline.

## 2022-04-16 ENCOUNTER — Emergency Department
Admission: EM | Admit: 2022-04-16 | Discharge: 2022-04-17 | Disposition: A | Discharge status: Skilled Nursing Facility | Payer: No Typology Code available for payment source | Attending: Student in an Organized Health Care Education/Training Program | Admitting: Student in an Organized Health Care Education/Training Program

## 2022-04-16 ENCOUNTER — Emergency Department: Payer: No Typology Code available for payment source

## 2022-04-16 ENCOUNTER — Encounter: Payer: Self-pay | Admitting: Emergency Medicine

## 2022-04-16 ENCOUNTER — Other Ambulatory Visit: Payer: Self-pay

## 2022-04-16 DIAGNOSIS — K5641 Fecal impaction: Secondary | ICD-10-CM | POA: Diagnosis not present

## 2022-04-16 DIAGNOSIS — N3001 Acute cystitis with hematuria: Secondary | ICD-10-CM | POA: Diagnosis not present

## 2022-04-16 DIAGNOSIS — E119 Type 2 diabetes mellitus without complications: Secondary | ICD-10-CM | POA: Insufficient documentation

## 2022-04-16 DIAGNOSIS — J449 Chronic obstructive pulmonary disease, unspecified: Secondary | ICD-10-CM | POA: Insufficient documentation

## 2022-04-16 DIAGNOSIS — I480 Paroxysmal atrial fibrillation: Secondary | ICD-10-CM | POA: Diagnosis not present

## 2022-04-16 DIAGNOSIS — G20C Parkinsonism, unspecified: Secondary | ICD-10-CM | POA: Diagnosis not present

## 2022-04-16 DIAGNOSIS — R109 Unspecified abdominal pain: Secondary | ICD-10-CM | POA: Diagnosis present

## 2022-04-16 LAB — URINALYSIS, ROUTINE W REFLEX MICROSCOPIC
Bilirubin Urine: NEGATIVE
Glucose, UA: NEGATIVE mg/dL
Ketones, ur: 5 mg/dL — AB
Nitrite: NEGATIVE
Protein, ur: >=300 mg/dL — AB
RBC / HPF: >50 RBC/hpf — ABNORMAL HIGH (ref 0–5)
Specific Gravity, Urine: 1.024 (ref 1.005–1.030)
Squamous Epithelial / HPF: NONE SEEN /HPF (ref 0–5)
WBC, UA: >50 WBC/hpf — ABNORMAL HIGH (ref 0–5)
pH: 8 (ref 5.0–8.0)

## 2022-04-16 LAB — COMPREHENSIVE METABOLIC PANEL
ALT: <5 U/L (ref 0–44)
AST: 14 U/L — ABNORMAL LOW (ref 15–41)
Albumin: 3.5 g/dL (ref 3.5–5.0)
Alkaline Phosphatase: 106 U/L (ref 38–126)
Anion gap: 9 (ref 5–15)
BUN: 25 mg/dL — ABNORMAL HIGH (ref 8–23)
CO2: 21 mmol/L — ABNORMAL LOW (ref 22–32)
Calcium: 9.2 mg/dL (ref 8.9–10.3)
Chloride: 107 mmol/L (ref 98–111)
Creatinine, Ser: 0.92 mg/dL (ref 0.61–1.24)
GFR, Estimated: >60 mL/min (ref >60)
Glucose, Bld: 117 mg/dL — ABNORMAL HIGH (ref 70–99)
Potassium: 4.5 mmol/L (ref 3.5–5.1)
Sodium: 137 mmol/L (ref 135–145)
Total Bilirubin: 1.1 mg/dL (ref 0.3–1.2)
Total Protein: 7.2 g/dL (ref 6.5–8.1)

## 2022-04-16 LAB — CBC
HCT: 42.9 % (ref 39.0–52.0)
Hemoglobin: 13.5 g/dL (ref 13.0–17.0)
MCH: 29 pg (ref 26.0–34.0)
MCHC: 31.5 g/dL (ref 30.0–36.0)
MCV: 92.3 fL (ref 80.0–100.0)
Platelets: 243 10*3/uL (ref 150–400)
RBC: 4.65 MIL/uL (ref 4.22–5.81)
RDW: 15.5 % (ref 11.5–15.5)
WBC: 8.7 10*3/uL (ref 4.0–10.5)
nRBC: 0 % (ref 0.0–0.2)

## 2022-04-16 LAB — LIPASE, BLOOD: Lipase: 28 U/L (ref 11–51)

## 2022-04-16 MED ORDER — IOHEXOL 300 MG/ML  SOLN
100.0000 mL | Freq: Once | INTRAMUSCULAR | Status: AC | PRN
  Administered 2022-04-16: 100 mL via INTRAVENOUS

## 2022-04-16 MED ORDER — MILK AND MOLASSES ENEMA
1.0000 | Freq: Once | RECTAL | Status: AC
  Administered 2022-04-16: 240 mL via RECTAL
  Filled 2022-04-16: qty 240

## 2022-04-16 MED ORDER — NITROFURANTOIN MONOHYD MACRO 100 MG PO CAPS: 100.0000 mg | ORAL_CAPSULE | Freq: Two times a day (BID) | ORAL | 0 refills | Status: AC

## 2022-04-16 MED ORDER — FOSFOMYCIN TROMETHAMINE 3 G PO PACK: 3.0000 g | PACK | Freq: Once | ORAL | Status: DC

## 2022-04-16 MED ORDER — SODIUM CHLORIDE 0.9 % IV SOLN: 1.0000 g | INTRAVENOUS | Status: DC

## 2022-04-16 MED ORDER — NITROFURANTOIN MONOHYD MACRO 100 MG PO CAPS: 100.0000 mg | ORAL_CAPSULE | Freq: Two times a day (BID) | ORAL | 0 refills | Status: DC

## 2022-04-16 NOTE — ED Notes (Signed)
Called [facility] to transport back to [facility] waiting for them to arrive

## 2022-04-16 NOTE — Discharge Instructions (Addendum)
You were manually disimpacted and also given an enema.  You were also found to have a urinary tract infection, please take antibiotics as prescribed.  Please return for any new, worsening, or change in symptoms or other concerns.  Remember to have a high fiber diet in order to be prevent constipation issues in the future.  It was a pleasure caring for you today.

## 2022-04-16 NOTE — ED Notes (Signed)
Pt cleaned up and sheets changed due to incontinence due to enema.

## 2022-04-16 NOTE — ED Triage Notes (Signed)
Pt is from [facility] [place]. Pt has not had a BM in a week. Pt has severe distention and pain.

## 2022-04-16 NOTE — ED Notes (Signed)
Pt cleaned up and sheets changed for incontinence due to enema.

## 2022-04-16 NOTE — ED Provider Notes (Signed)
[facility] Provider Note    Event Date/Time   First MD Initiated Contact with Patient 04/16/22 1514     (approximate)   History   Abdominal Pain   HPI  Raymond Hays is a 77 y.o. male with a past medical history of Parkinson's disease, paroxysmal atrial fibrillation, chronic indwelling Foley catheter due to neurogenic bladder, COPD, hypertension who presents today for evaluation of abdominal pain and distention for the past 5 days.  He reports he has not had a bowel movement in the same duration of time.  He has also noted hematuria for the last week.  He reports he has had a Foley catheter in for many years.  He reports that he has not had any nausea or vomiting.  He is still passing flatus.  He has not had any fevers or chills. No flank pain.  Patient Active Problem List   Diagnosis Date Noted   Acute respiratory failure ([place]) 04/03/2021   E. 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   Chronic respiratory failure with hypoxia ([place]) 01/10/2020   Impaired functional mobility, balance, gait, and endurance 09/15/2019   Catheter cystitis ([place]) 10/16/2018   COVID-19 virus detected 07/03/2018   Benign prostatic hyperplasia with lower urinary tract symptoms 08/06/2017   Osteoarthritis of right knee 06/19/2017   S/P orthopedic surgery, follow-up exam 10/25/2016   Tendon rupture of wrist, sequela 01/02/2016   Acute pain of right knee 08/03/2015   Calcific tendinitis of left shoulder 07/05/2015   Left  rotator cuff tear arthropathy 07/05/2015   Gait difficulty 06/26/2015   Severe mitral regurgitation 06/05/2015   AF (paroxysmal atrial fibrillation) ([place]) 05/31/2015   Chronic pain 05/31/2015   COPD with acute exacerbation ([place]) 05/31/2015   Essential hypertension 05/31/2015   Chronic pain of left wrist 04/24/2015   Arthritis of left wrist 04/24/2015   Rupture of extensor tendon of left hand 04/24/2015          Physical Exam   Triage Vital Signs: ED Triage Vitals  Enc Vitals Group     BP 04/16/22 1303 117/75     Pulse Rate 04/16/22 1303 (!) 107     Resp 04/16/22 1303 (!) 21     Temp 04/16/22 1303 98.6 F (37 C)     Temp Source 04/16/22 1303 Oral     SpO2 04/16/22 1303 95 %     Weight 04/16/22 1302 215 lb (97.5 kg)     Height 04/16/22 1510 5\' 7"  (1.702 m)     Head Circumference --      Peak Flow --      Pain Score 04/16/22 1302 5     Pain Loc --      Pain Edu? --      Excl. in [place]? --     Most recent vital signs: Vitals:   04/16/22 1818 04/16/22 2219  BP:  (!) 116/100  Pulse:  89  Resp:  20  Temp: 98.6 F (37 C) 97.7 F (36.5 C)  SpO2:  99%    Physical Exam Vitals and nursing note reviewed.  Constitutional:      General: Awake and alert. No acute distress.    Appearance: Normal appearance. The patient is obese.  HENT:     Head: Normocephalic and atraumatic.     Mouth: Mucous membranes are moist.  Eyes:     General: PERRL. Normal EOMs        Right eye: No discharge.        Left eye: No discharge.     Conjunctiva/sclera: Conjunctivae normal.  Cardiovascular:     Rate and Rhythm: Normal rate and regular rhythm.     Pulses: Normal pulses.  Pulmonary:     Effort: Pulmonary effort is normal. No respiratory distress.     Breath sounds: Normal breath sounds.  Abdominal:     Abdomen is mildly distended.  There is no abdominal tenderness. No rebound or guarding. No CVAT. Large amount of stool in rectal vault, light brown. No bleeding or melena noted. No  external abnormalities. Normal rectal tone. Musculoskeletal:        General: No swelling. Normal range of motion.     Cervical back: Normal range of motion and neck supple.  Skin:    General: Skin is warm and dry.     Capillary Refill: Capillary refill takes less than 2 seconds.     Findings: No rash.  Neurological:     Mental Status: The patient is awake and alert.      ED Results / Procedures / Treatments   Labs (all labs ordered are listed, but only abnormal results are displayed) Labs Reviewed  COMPREHENSIVE METABOLIC PANEL - Abnormal; Notable for the following components:      Result Value   CO2 21 (*)    Glucose, Bld 117 (*)    BUN 25 (*)    AST 14 (*)    All other components within normal limits  URINALYSIS, ROUTINE W REFLEX MICROSCOPIC - Abnormal; Notable for the following components:   Color, Urine AMBER (*)    APPearance CLOUDY (*)    Hgb urine dipstick MODERATE (*)    Ketones, ur 5 (*)    Protein, ur >=300 (*)    Leukocytes,Ua LARGE (*)    RBC / HPF >50 (*)    WBC, UA >50 (*)    Bacteria, UA MANY (*)    All other components within normal limits  URINE CULTURE  LIPASE, BLOOD  CBC     EKG     RADIOLOGY I independently reviewed and interpreted imaging and agree with radiologists findings.     PROCEDURES:  Critical Care performed:   Fecal disimpaction  Date/Time: 04/16/2022 10:52 PM  Performed by: Jackelyn Hoehn, PA-C Authorized by: Jackelyn Hoehn, PA-C  Consent: Verbal consent obtained. Risks and benefits: risks, benefits and alternatives were discussed Consent given by: patient Patient understanding: patient states understanding of the procedure being performed Patient consent: the patient's understanding of the procedure matches consent given Procedure consent: procedure consent matches procedure scheduled Relevant documents: relevant documents present and verified Test results: test results available and properly labeled Site marked: the  operative site was marked Imaging studies: imaging studies available Required items: required blood products, implants, devices, and special equipment available Patient identity confirmed: verbally with patient Time out: Immediately prior to procedure a "time out" was called to verify the correct patient, procedure, equipment, support staff  and site/side marked as required. Preparation: Patient was prepped and draped in the usual sterile fashion. Local anesthesia used: no  Anesthesia: Local anesthesia used: no  Sedation: Patient sedated: no  Patient tolerance: patient tolerated the procedure well with no immediate complications Comments: Large amount of stool in rectal vault. No bleeding. Stool is brown.      MEDICATIONS ORDERED IN ED: Medications  iohexol (OMNIPAQUE) 300 MG/ML solution 100 mL (100 mLs Intravenous Contrast Given 04/16/22 1615)  milk and molasses enema (240 mLs Rectal Given 04/16/22 1922)     IMPRESSION / MDM / ASSESSMENT AND PLAN / ED COURSE  I reviewed the triage vital signs and the nursing notes.   Differential diagnosis includes, but is not limited to, small bowel obstruction, large bowel obstruction, constipation, urinary tract infection.  Patient is awake and alert, mildly tachycardic on arrival to 107 but normotensive and afebrile.  He has minimal tenderness to palpation of his abdomen.  Labs are overall reassuring, but CT scan was obtained and demonstrates a fecal impaction without obstruction.  On rectal exam, patient does have large amount of stool in his rectal vault.  He agreed to manual disimpaction with large amount of stool and gas released and improvement of his symptoms.  There is still a small amount left that I was unable to reach my finger and he was subsequently given milk of molasses enema with significant stool output x2 and resolution of his symptoms.  We discussed the importance of a high-fiber diet.    Urinalysis is also suggestive of urinary  tract infection.  No fevers or CVA tenderness to suggest pyelonephritis.  Per chart review, patient has a history of ESBL.  I discussed this with pharmacy, and had intended to give him fosfomycin.  However, pharmacy feels that Macrobid is more appropriate for him.  I expressed my concerns given the patient's age and potential poor renal perfusion and the pharmacist assures me that he has kidney function that will suffice for this medicine.  They do not recommend IV antibiotics or fosfomycin in this circumstance.  He has remained that Macrobid 100 mg twice daily for 5 days is the appropriate medicine for this patient.  We discussed strict return precautions and the importance of close outpatient follow-up.  Patient understands and agrees with plan.  He was discharged in stable condition.  Patient's presentation is most consistent with acute complicated illness / injury requiring diagnostic workup.  Clinical Course as of 04/16/22 2257  Tue Apr 16, 2022  1901 Patient has a history of ESBL UTIs.  I discussed this with pharmacy and I had planned on doing fosfomycin, the pharmacy recommends Macrobid.  I discussed my concerns regarding renal clearance in the elderly patients with Macrobid, though the pharmacist assured me that Macrobid x5 days is appropriate in this patient [JP]  2036 Patient had large bowel movement and is feeling significantly improved [JP]    Clinical Course User Index [JP] Carrie Schoonmaker, Clarnce Flock, PA-C     FINAL CLINICAL IMPRESSION(S) / ED DIAGNOSES   Final diagnoses:  Fecal impaction ([place])  Acute cystitis with hematuria     Rx / DC Orders   ED Discharge Orders          Ordered    nitrofurantoin, macrocrystal-monohydrate, (MACROBID) 100 MG capsule  2 times daily,   Status:  Discontinued        04/16/22 2017    nitrofurantoin, macrocrystal-monohydrate, (MACROBID) 100 MG capsule  2 times daily  04/16/22 2143             Note:  This document was prepared using Dragon  voice recognition software and may include unintentional dictation errors.   Keturah Shavers 04/16/22 2257    Willy Eddy, MD 04/19/22 [phone]

## 2022-04-17 NOTE — ED Notes (Signed)
Patient verbalizes understanding of discharge instructions. Opportunity for questioning and answers were provided. Armband removed by staff, pt discharged from ED.  

## 2022-04-17 NOTE — ED Notes (Signed)
Pt picked up and transported back to [facility] facility

## 2022-04-19 LAB — URINE CULTURE: Culture: >=100000 — AB

## 2022-04-20 NOTE — Consult Note (Signed)
ED Antimicrobial Stewardship Positive Culture Follow Up   Raymond Hays is an 77 y.o. male who presented to [facility] on 04/16/2022 with a chief complaint of  Chief Complaint  Patient presents with   Abdominal Pain    Recent Results (from the past 720 hour(s))  Urine Culture     Status: Abnormal   Collection Time: 04/16/22  1:04 PM   Specimen: Urine, Clean Catch  Result Value Ref Range Status   Specimen Description   Final    URINE, CLEAN CATCH Performed at [facility], [address], [place], [place] [address]    Special Requests   Final    NONE Performed at [facility], [address] [place], [place] [address]    Culture (A)  Final    >=100,000 COLONIES/mL KLEBSIELLA PNEUMONIAE Confirmed Extended Spectrum Beta-Lactamase Producer (ESBL).  In bloodstream infections from ESBL organisms, carbapenems are preferred over piperacillin/tazobactam. They are shown to have a lower risk of mortality.    Report Status 04/19/2022 FINAL  Final   Organism ID, Bacteria KLEBSIELLA PNEUMONIAE (A)  Final      Susceptibility   Klebsiella pneumoniae - MIC*    AMPICILLIN >=32 RESISTANT Resistant     CEFAZOLIN >=64 RESISTANT Resistant     CEFEPIME >=32 RESISTANT Resistant     CEFTRIAXONE >=64 RESISTANT Resistant     CIPROFLOXACIN 1 RESISTANT Resistant     GENTAMICIN >=16 RESISTANT Resistant     IMIPENEM 0.5 SENSITIVE Sensitive     NITROFURANTOIN 128 RESISTANT Resistant     TRIMETH/SULFA >=320 RESISTANT Resistant     AMPICILLIN/SULBACTAM >=32 RESISTANT Resistant     PIP/TAZO 16 SENSITIVE Sensitive     * >=100,000 COLONIES/mL KLEBSIELLA PNEUMONIAE    [x]  Treated with Macrobid, organism resistant to prescribed antimicrobial []  Patient discharged originally without antimicrobial agent and treatment is now indicated  New antibiotic prescription: None 77 yo M presented with abdominal pain on 1/16 and CTAP showed fecal impaction and gaseous distention. Patient's symptoms  improved with disimpaction. Obtained a urine culture although patient without urinary symptoms. Patient has chronic indwelling Foley catheter and a history of ESBL UTI and the Urine cx came back growing ESBL K. pneumo. Sent out on Macrobid, consistent with IDSA ESBL guidance document recommendation, but it is resistance. No reliable oral options for this patient, and they were asymptomatic. Suspect this is just a colonizer. ED physician agreed with recommendation to not pursue additional treatment.  ED Provider: Blake Divine  Will M. Ouida Sills, PharmD PGY-1 Pharmacy Resident 04/20/2022 2:25 PM

## 2022-05-24 ENCOUNTER — Ambulatory Visit: Payer: No Typology Code available for payment source | Admitting: Adult Health

## 2022-06-17 DIAGNOSIS — R0989 Other specified symptoms and signs involving the circulatory and respiratory systems: Secondary | ICD-10-CM | POA: Insufficient documentation

## 2022-06-17 DIAGNOSIS — K219 Gastro-esophageal reflux disease without esophagitis: Secondary | ICD-10-CM | POA: Insufficient documentation

## 2022-07-23 NOTE — Progress Notes (Unsigned)
Patient: Raymond Hays  Service Category: E/M  Provider: Oswaldo Done, MD  DOB: March 03, 1946  DOS: 07/24/2022  Referring Provider: [facility]  MRN: [id number]  Setting: Ambulatory outpatient  [facility]P: [facility]  Type: New Patient  Specialty: Interventional Pain Management    Location: Office  Delivery: Face-to-face     Primary Reason(s) for Visit: Encounter for initial evaluation of one or more chronic problems (new to examiner) potentially causing chronic pain, and posing a threat to normal musculoskeletal function. (Level of risk: High) CC: No chief complaint on file.  HPI  Raymond Hays is a 77 y.o. year old, male patient, who comes for the first time to our practice referred by [facility] for our initial evaluation of his chronic pain. He has AF (paroxysmal atrial fibrillation); Benign prostatic hyperplasia with lower urinary tract symptoms; Calcific tendinitis of left shoulder; Chronic pain; Chronic pain of left wrist; Acute pain of right knee; COPD with acute exacerbation; COVID-19 virus detected; Arthritis of left wrist; Essential hypertension; Gait difficulty; Osteoarthritis of right knee; Impaired functional mobility, balance, gait, and endurance; Left rotator cuff tear arthropathy; Rupture of extensor tendon of left hand; S/P orthopedic surgery, follow-up exam; Severe mitral regurgitation; Tendon rupture of wrist, sequela; Catheter cystitis; Chronic respiratory failure with hypoxia; Sepsis; Neck pain; AKI (acute kidney injury); Type 2 diabetes mellitus with diabetic neuropathy, without long-term current use of insulin; Parkinson disease; Ileus; Respiratory failure; Obstipation; Anxiety; Iron deficiency anemia; Edema leg; Severe sepsis with septic shock; E. coli UTI; Herpes zoster conjunctivitis of left eye; Acute respiratory failure; Status post implantation of mitral valve leaflet clip; Laryngopharyngeal reflux; Throat clearing; Chronic pain of right knee; Atrial fibrillation;  Urinary retention; Primary osteoarthritis of left shoulder; Rotator cuff tear arthropathy of right shoulder; and COPD (chronic obstructive pulmonary disease) on their problem list. Today he comes in for evaluation of his No chief complaint on file.  Pain Assessment: Location:     Radiating:   Onset:   Duration:   Quality:   Severity:  /10 (subjective, self-reported pain score)  Effect on ADL:   Timing:   Modifying factors:   BP:    HR:    Onset and Duration: {Hx; Onset and Duration:210120511} Cause of pain: {Hx; Cause:210120521} Severity: {Pain Severity:210120502} Timing: {Symptoms; Timing:210120501} Aggravating Factors: {Causes; Aggravating pain factors:210120507} Alleviating Factors: {Causes; Alleviating Factors:210120500} Associated Problems: {Hx; Associated problems:210120515} Quality of Pain: {Hx; Symptom quality or Descriptor:210120531} Previous Examinations or Tests: {Hx; Previous examinations or test:210120529} Previous Treatments: {Hx; Previous Treatment:210120503}  Raymond Hays is being evaluated for possible interventional pain management therapies for the treatment of his chronic pain.   ***  Raymond Hays has been informed that this initial visit was an evaluation only.  On the follow up appointment I will go over the results, including ordered tests and available interventional therapies. At that time he will have the opportunity to decide whether to proceed with offered therapies or not. In the event that Mr. Schellenberg prefers avoiding interventional options, this will conclude our involvement in the case.  Medication management recommendations may be provided upon request.  Historic Controlled Substance Pharmacotherapy Review  PMP and historical list of controlled substances: ***  Most recently prescribed opioid analgesics:   *** MME/day: *** mg/day  Historical Monitoring: The patient  reports that he does not currently use drugs after having used the following drugs: "Crack"  cocaine. List of prior UDS Testing: No results found for: "MDMA", "COCAINSCRNUR", "PCPSCRNUR", "PCPQUANT", "CANNABQUANT", "THCU", "ETH", "CBDTHCR", "D8THCCBX", "D9THCCBX"  Historical Background Evaluation: [place] PMP: PDMP reviewed during this encounter. Review of the past 28-months conducted.             PMP NARX Score Report:  Narcotic: *** Sedative: *** Stimulant: *** [place] Department of public safety, offender search: Engineer, mining Information) Non-contributory Risk Assessment Profile: Aberrant behavior: None observed or detected today Risk factors for fatal opioid overdose: None identified today PMP NARX Overdose Risk Score: *** Fatal overdose hazard ratio (HR): Calculation deferred Non-fatal overdose hazard ratio (HR): Calculation deferred Risk of opioid abuse or dependence: 0.7-3.0% with doses ? 36 MME/day and 6.1-26% with doses ? 120 MME/day. Substance use disorder (SUD) risk level: See below Personal History of Substance Abuse (SUD-Substance use disorder):  Alcohol:    Illegal Drugs:    Rx Drugs:    ORT Risk Level calculation:    ORT Scoring interpretation table:  Score <3 = Low Risk for SUD  Score between 4-7 = Moderate Risk for SUD  Score >8 = High Risk for Opioid Abuse   PHQ-2 Depression Scale:  Total score:    PHQ-2 Scoring interpretation table: (Score and probability of major depressive disorder)  Score 0 = No depression  Score 1 = 15.4% Probability  Score 2 = 21.1% Probability  Score 3 = 38.4% Probability  Score 4 = 45.5% Probability  Score 5 = 56.4% Probability  Score 6 = 78.6% Probability   PHQ-9 Depression Scale:  Total score:    PHQ-9 Scoring interpretation table:  Score 0-4 = No depression  Score 5-9 = Mild depression  Score 10-14 = Moderate depression  Score 15-19 = Moderately severe depression  Score 20-27 = Severe depression (2.4 times higher risk of SUD and 2.89 times higher risk of overuse)   Pharmacologic Plan: As per protocol, I have not taken over any  controlled substance management, pending the results of ordered tests and/or consults.            Initial impression: Pending review of available data and ordered tests.  Meds   Current Outpatient Medications:    acetaminophen (TYLENOL) 325 MG tablet, Take 650 mg by mouth every 4 (four) hours as needed for mild pain., Disp: , Rfl:    Albuterol Sulfate (PROAIR RESPICLICK) 108 (90 Base) MCG/ACT AEPB, Inhale 2 puffs into the lungs every 4 (four) hours as needed (COPD)., Disp: , Rfl:    amiodarone (PACERONE) 200 MG tablet, Take 1 tablet (200 mg total) by mouth daily., Disp: , Rfl:    apixaban (ELIQUIS) 5 MG TABS tablet, Take 5 mg by mouth 2 (two) times daily. , Disp: , Rfl:    atorvastatin (LIPITOR) 20 MG tablet, Take 20 mg by mouth at bedtime., Disp: , Rfl:    calcium carbonate (TUMS - DOSED IN MG ELEMENTAL CALCIUM) 500 MG chewable tablet, Chew 2 tablets by mouth every 6 (six) hours as needed for indigestion or heartburn., Disp: , Rfl:    carbidopa-levodopa (SINEMET IR) 25-100 MG tablet, Take 1 tablet by mouth 3 (three) times daily before meals., Disp: , Rfl:    Emollient (CETAPHIL) cream, Apply 1 application topically 2 (two) times daily as needed (dry skin). (Apply to entire body), Disp: , Rfl:    escitalopram (LEXAPRO) 5 MG tablet, Take 5 mg by mouth daily., Disp: , Rfl:    feeding supplement, GLUCERNA SHAKE, (GLUCERNA SHAKE) LIQD, Take 237 mLs by mouth 2 (two) times daily between meals., Disp: , Rfl: 0   finasteride (PROSCAR) 5 MG tablet, Take 5 mg by mouth  daily., Disp: , Rfl:    fluticasone (FLONASE) 50 MCG/ACT nasal spray, Place 1 spray into both nostrils daily., Disp: , Rfl:    furosemide (LASIX) 40 MG tablet, Take 1 tablet (40 mg total) by mouth 2 (two) times daily for 7 days., Disp: , Rfl: 0   gabapentin (NEURONTIN) 800 MG tablet, Take 800 mg by mouth 2 (two) times daily. , Disp: , Rfl:    hydrOXYzine (ATARAX/VISTARIL) 25 MG tablet, Take 25 mg by mouth 2 (two) times daily as needed for  itching., Disp: , Rfl:    ipratropium-albuterol (DUONEB) 0.5-2.5 (3) MG/3ML SOLN, Inhale 3 mLs into the lungs every 6 (six) hours as needed., Disp: 360 mL, Rfl:    lactase (LACTAID) 3000 units tablet, Take 3,000 Units by mouth 3 (three) times daily with meals., Disp: , Rfl:    lip balm (CARMEX) ointment, Apply 1 application topically as needed for lip care., Disp: 7 g, Rfl: 0   Menthol, Topical Analgesic, 4 % GEL, Apply 1 application topically 3 (three) times daily. (Apply to neck), Disp: , Rfl:    methocarbamol (ROBAXIN) 500 MG tablet, Take 1,000 mg by mouth in the morning and at bedtime. , Disp: , Rfl:    metoCLOPramide (REGLAN) 10 MG tablet, Take 1 tablet (10 mg total) by mouth 3 (three) times daily before meals for 14 days., Disp: , Rfl: 0   metoprolol tartrate (LOPRESSOR) 25 MG tablet, Hold pending outpatient followup due to low blood pressure., Disp: , Rfl:    oxybutynin (DITROPAN-XL) 10 MG 24 hr tablet, Take 1 tablet (10 mg total) by mouth daily., Disp: , Rfl:    pantoprazole (PROTONIX) 40 MG tablet, Take 1 tablet (40 mg total) by mouth 2 (two) times daily before a meal. For 8 weeks. (Patient taking differently: Take 40 mg by mouth 2 (two) times daily before a meal.), Disp: , Rfl: 2   polyethylene glycol (MIRALAX / GLYCOLAX) 17 g packet, Take 17 g by mouth 2 (two) times daily., Disp: , Rfl: 0   prednisoLONE acetate (PRED FORTE) 1 % ophthalmic suspension, Place 1 drop into both eyes 4 (four) times daily., Disp: 5 mL, Rfl: 0   Propylene Glycol (SYSTANE BALANCE) 0.6 % SOLN, Place 2 drops into both eyes in the morning, at noon, and at bedtime., Disp: , Rfl:    psyllium (METAMUCIL) 58.6 % powder, Take 1 packet by mouth daily., Disp: , Rfl:    selenium sulfide (SELSUN) 1 % LOTN, Apply 1 application topically 2 (two) times a week. (Tuesday and Friday), Disp: , Rfl:    TRELEGY ELLIPTA 100-62.5-25 MCG/INH AEPB, Inhale 1 puff into the lungs daily., Disp: , Rfl:    triamcinolone cream (KENALOG) 0.1 %,  Apply 1 application topically 2 (two) times daily., Disp: , Rfl:   Imaging Review  Cervical Imaging: Cervical MR wo contrast: No results found for this or any previous visit.  Cervical MR wo contrast: No valid procedures specified. Cervical MR w/wo contrast: Results for orders placed during the hospital encounter of 10/16/20  MR CERVICAL SPINE W WO CONTRAST  Narrative CLINICAL DATA:  Initial evaluation for neck pain in the setting of sepsis and bacteremia, myelopathy. History of prior fusion.  EXAM: MRI CERVICAL SPINE WITHOUT AND WITH CONTRAST  TECHNIQUE: Multiplanar and multiecho pulse sequences of the cervical spine, to include the craniocervical junction and cervicothoracic junction, were obtained without and with intravenous contrast.  CONTRAST:  10mL GADAVIST GADOBUTROL 1 MMOL/ML IV SOLN  COMPARISON:  Prior CT from  earlier the same day.  FINDINGS: Alignment: Straightening of the normal cervical lordosis, stable. Trace chronic anterolisthesis of C7 on T1.  Vertebrae: Susceptibility artifact from prior C4-5 corpectomy with C3-C6 anterior fusion, and C3-C7 posterior fusion. Hardware better evaluated on prior CT. Vertebral body height maintained without acute or chronic fracture. Underlying bone marrow signal intensity within normal limits. No worrisome osseous lesions.  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 Associated mild reactive marrow edema involving the right lateral masses of C1 and C2 as well as the dens and anterior arch of C1. There is prominent soft tissue thickening with prevertebral edema and enhancement extending from the inferior aspect of the clivus to the C2-3 level (series 16, image 9) finding is nonspecific, but raises the possibility for skull base infection given the history of sepsis and bacteremia. Possible tiny 5 mm collection at this level (series 16, image 7).  No other evidence for  osteomyelitis discitis or septic arthritis within the cervical spine.  Cord: Normal signal and morphology. No epidural abscess or other collection.  Posterior Fossa, vertebral arteries, paraspinal tissues: Visualized brain and posterior fossa within normal limits. Prominent degenerative thickening at the dens/tectorial membrane with associated moderate stenosis at the cervicomedullary junction. Prevertebral edema and enhancement extending from the clivus through the C2-3 level as above. Additional mild edema and enhancement involving the posterior suboccipital soft tissues at the level of the posterior arch of C1 as well. There is a superimposed curvilinear rim enhancing collection within this region, closely approximating the posterior arch of C1 (series 16, images 5-17). This is also seen on prior CT (series 5, image 18 of that exam). Collection measures up to approximately 2.8 cm in size on prior CT, difficult to measure on this exam given location and morphology. Finding is indeterminate, but could reflect abscess.  Remainder of the visualized soft tissues demonstrate no other acute finding. Normal flow voids seen within the vertebral arteries bilaterally.  Disc levels:  C1-2: Advanced osteoarthritic changes about the C1-2 articulation with degenerative thickening about the dens and tectorial membrane. Superimposed ligamentum flavum hypertrophy. Resultant moderate spinal stenosis without frank cord impingement.  C2-C3: Mild disc bulge with uncovertebral hypertrophy. Moderate left with mild right facet degeneration. Resultant moderate left with mild right C3 foraminal stenosis.  C3-C4: Prior fusion with posterior decompression. No residual spinal stenosis. Residual mild to moderate left greater than right foraminal narrowing related to uncovertebral disease.  C4-C5: Prior fusion with posterior decompression. No residual spinal stenosis. Residual mild to moderate bilateral  foraminal narrowing related to residual uncovertebral and facet disease.  C5-C6: Prior fusion with posterior decompression. No residual spinal stenosis. Residual moderate bilateral foraminal narrowing related to uncovertebral hypertrophy.  C6-C7: Prior fusion with posterior decompression. Left paracentral endplate osseous ridging indents the left ventral thecal sac. No significant spinal stenosis. Residual moderate bilateral foraminal narrowing.  C7-T1: Trace anterolisthesis. Disc bulge with uncovertebral hypertrophy with advanced facet degeneration. No significant spinal stenosis. Moderate right worse than left C8 foraminal stenosis.  Visualized upper thoracic spine demonstrates mild noncompressive disc bulging without significant stenosis.  IMPRESSION: 1. Prominent soft tissue thickening with edema and enhancement involving the prevertebral soft tissues, extending from the clivus through the C2-3 level. Finding is nonspecific, but raises the possibility for acute skull base infection given the history of sepsis and bacteremia. Question tiny superimposed 5 mm collection as above. Correlation with symptomatology and laboratory values recommended. 2. Mild edema and enhancement involving the posterior suboccipital soft tissues  with superimposed collection tracking along the posterior arch of C1, nonspecific, but could also reflect acute infection. This collection is more easily seen on prior CT (series 5, image 18 of that examination). 3. No other evidence for acute infection within the cervical spine. 4. 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 5. Prior C4-5 corpectomy with C3-C6 anterior fusion, with C3-C7 posterior fusion and decompression. No residual spinal stenosis at these levels.   Electronically Signed By: Rise Mu M.D. On: 10/27/2020 03:49  Cervical MR w  contrast: No results found for this or any previous visit.  Cervical CT wo contrast: No results found for this or any previous visit.  Cervical CT w/wo contrast: No results found for this or any previous visit.  Cervical CT w/wo contrast: No results found for this or any previous visit.  Cervical CT w contrast: Results for orders placed during the hospital encounter of 10/16/20  CT CERVICAL SPINE W CONTRAST  Narrative CLINICAL DATA:  Neck pain, acute, infection suspected. Patient with Gp C strep bactremia, neck hardware with worsening pain.  EXAM: CT CERVICAL SPINE WITH CONTRAST  TECHNIQUE: Multidetector CT imaging of the cervical spine was performed during intravenous contrast administration. Multiplanar CT image reconstructions were also generated.  CONTRAST:  75mL OMNIPAQUE IOHEXOL 350 MG/ML SOLN  COMPARISON:  10/18/2020  FINDINGS: Alignment: Unchanged cervical spine straightening. No significant listhesis.  Skull base and vertebrae: Postsurgical changes from C4-5 corpectomy, C3-C6 anterior fusion, and C3-C7 posterior fusion and laminectomies as previously detailed. Remote, solid interbody fusion at C6-7. Advanced C1-2 arthropathy with unchanged mild basilar impression. No interval osseous destruction to suggest osteomyelitis. No acute fracture.  Soft tissues and spinal canal: Limited assessment of the spinal canal due to streak artifact. No gross prevertebral fluid or significant prevertebral soft tissue swelling.  Disc levels: Unchanged appearance of postoperative and degenerative changes in the cervical and included upper thoracic spine with multilevel neural foraminal stenosis as detailed on the recent prior CT.  Upper chest: Clear lung apices.  Other: Extensive motion artifact through the pharynx and larynx.  IMPRESSION: No acute osseous abnormality identified in the cervical spine. Limited assessment of the spinal canal due to artifact. MRI  would provide much better assessment for epidural abscess or early changes of discitis-osteomyelitis.   Electronically Signed By: Sebastian Ache M.D. On: 10/26/2020 14:06  Cervical CT outside: No results found for this or any previous visit.  Cervical DG 1 view: No results found for this or any previous visit.  Cervical DG 2-3 views: Results for orders placed during the hospital encounter of 10/16/20  DG Cervical Spine 2 or 3 views  Narrative CLINICAL DATA:  Neck pain.  EXAM: CERVICAL SPINE - 2-3 VIEW  COMPARISON:  No prior.  FINDINGS: Very limited exam due to positioning and motion artifact. Postsurgical changes cervical spine consistent prior anterior posterior fusion and mid cervical spine corpectomy. Hardware intact. Anatomic alignment. Severe osteopenia and degenerative change cervical spine. No acute bony abnormality identified. Probable carotid vascular calcification.  IMPRESSION: 1. Very limited exam due to positioning and motion artifact. Postsurgical changes cervical spine. Hardware intact. Severe osteopenia and degenerative change cervical spine.  2.  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   Electronically Signed By: Maisie Fus  Register On: 10/17/2020 11:33  Cervical DG F/E views: No results found for this or any previous visit.  Cervical DG 2-3 clearing views: No results found for this or any previous visit.  Cervical DG Bending/F/E  views: No results found for this or any previous visit.  Cervical DG complete: No results found for this or any previous visit.  Cervical DG Myelogram views: No results found for this or any previous visit.  Cervical DG Myelogram views: No results found for this or any previous visit.  Cervical Discogram views: No results found for this or any previous visit.   Shoulder Imaging: Shoulder-R MR w contrast: No results found for this or any previous visit.  Shoulder-L MR w contrast: No results found for this or any previous  visit.  Shoulder-R MR w/wo contrast: No results found for this or any previous visit.  Shoulder-L MR w/wo contrast: No results found for this or any previous visit.  Shoulder-R MR wo contrast: No results found for this or any previous visit.  Shoulder-L MR wo contrast: No results found for this or any previous visit.  Shoulder-R CT w contrast: No results found for this or any previous visit.  Shoulder-L CT w contrast: No results found for this or any previous visit.  Shoulder-R CT w/wo contrast: No results found for this or any previous visit.  Shoulder-L CT w/wo contrast: No results found for this or any previous visit.  Shoulder-R CT wo contrast: No results found for this or any previous visit.  Shoulder-L CT wo contrast: No results found for this or any previous visit.  Shoulder-R DG Arthrogram: No results found for this or any previous visit.  Shoulder-L DG Arthrogram: No results found for this or any previous visit.  Shoulder-R DG 1 view: No results found for this or any previous visit.  Shoulder-L DG 1 view: No results found for this or any previous visit.  Shoulder-R DG: No results found for this or any previous visit.  Shoulder-L DG: No results found for this or any previous visit.   Thoracic Imaging: Thoracic MR wo contrast: No results found for this or any previous visit.  Thoracic MR wo contrast: No valid procedures specified. Thoracic MR w/wo contrast: No results found for this or any previous visit.  Thoracic MR w contrast: No results found for this or any previous visit.  Thoracic CT wo contrast: No results found for this or any previous visit.  Thoracic CT w/wo contrast: No results found for this or any previous visit.  Thoracic CT w/wo contrast: No results found for this or any previous visit.  Thoracic CT w contrast: No results found for this or any previous visit.  Thoracic DG 2-3 views: No results found for this or any previous visit.  Thoracic  DG 4 views: No results found for this or any previous visit.  Thoracic DG: No results found for this or any previous visit.  Thoracic DG w/swimmers view: No results found for this or any previous visit.  Thoracic DG Myelogram views: No results found for this or any previous visit.  Thoracic DG Myelogram views: No results found for this or any previous visit.   Lumbosacral Imaging: Lumbar MR wo contrast: No results found for this or any previous visit.  Lumbar MR wo contrast: No valid procedures specified. Lumbar MR w/wo contrast: No results found for this or any previous visit.  Lumbar MR w/wo contrast: No results found for this or any previous visit.  Lumbar MR w contrast: No results found for this or any previous visit.  Lumbar CT wo contrast: No results found for this or any previous visit.  Lumbar CT w/wo contrast: No results found for this or any previous  visit.  Lumbar CT w/wo contrast: No results found for this or any previous visit.  Lumbar CT w contrast: No results found for this or any previous visit.  Lumbar DG 1V: No results found for this or any previous visit.  Lumbar DG 1V (Clearing): No results found for this or any previous visit.  Lumbar DG 2-3V (Clearing): No results found for this or any previous visit.  Lumbar DG 2-3 views: No results found for this or any previous visit.  Lumbar DG (Complete) 4+V: No results found for this or any previous visit.        Lumbar DG F/E views: No results found for this or any previous visit.        Lumbar DG Bending views: No results found for this or any previous visit.        Lumbar DG Myelogram views: No results found for this or any previous visit.  Lumbar DG Myelogram: No results found for this or any previous visit.  Lumbar DG Myelogram: No results found for this or any previous visit.  Lumbar DG Myelogram: No results found for this or any previous visit.  Lumbar DG Myelogram Lumbosacral: No results found for  this or any previous visit.  Lumbar DG Diskogram views: No results found for this or any previous visit.  Lumbar DG Diskogram views: No results found for this or any previous visit.  Lumbar DG Epidurogram OP: No results found for this or any previous visit.  Lumbar DG Epidurogram IP: No valid procedures specified.  Sacroiliac Joint Imaging: Sacroiliac Joint DG: No results found for this or any previous visit.  Sacroiliac Joint MR w/wo contrast: No results found for this or any previous visit.  Sacroiliac Joint MR wo contrast: No results found for this or any previous visit.   Spine Imaging: Whole Spine DG Myelogram views: No results found for this or any previous visit.  Whole Spine MR Mets screen: No results found for this or any previous visit.  Whole Spine MR Mets screen: No results found for this or any previous visit.  Whole Spine MR w/wo: No results found for this or any previous visit.  MRA Spinal Canal w/ cm: No results found for this or any previous visit.  MRA Spinal Canal wo/ cm: No valid procedures specified. MRA Spinal Canal w/wo cm: No results found for this or any previous visit.  Spine Outside MR Films: No results found for this or any previous visit.  Spine Outside CT Films: No results found for this or any previous visit.  CT-Guided Biopsy: No results found for this or any previous visit.  CT-Guided Needle Placement: No results found for this or any previous visit.  DG Spine outside: No results found for this or any previous visit.  IR Spine outside: No results found for this or any previous visit.  NM Spine outside: No results found for this or any previous visit.   Hip Imaging: Hip-R MR w contrast: No results found for this or any previous visit.  Hip-L MR w contrast: No results found for this or any previous visit.  Hip-R MR w/wo contrast: No results found for this or any previous visit.  Hip-L MR w/wo contrast: No results found for this or  any previous visit.  Hip-R MR wo contrast: No results found for this or any previous visit.  Hip-L MR wo contrast: No results found for this or any previous visit.  Hip-R CT w contrast: No results found for this  or any previous visit.  Hip-L CT w contrast: No results found for this or any previous visit.  Hip-R CT w/wo contrast: No results found for this or any previous visit.  Hip-L CT w/wo contrast: No results found for this or any previous visit.  Hip-R CT wo contrast: No results found for this or any previous visit.  Hip-L CT wo contrast: No results found for this or any previous visit.  Hip-R DG 2-3 views: No results found for this or any previous visit.  Hip-L DG 2-3 views: No results found for this or any previous visit.  Hip-R DG Arthrogram: No results found for this or any previous visit.  Hip-L DG Arthrogram: No results found for this or any previous visit.  Hip-B DG Bilateral: No results found for this or any previous visit.   Knee Imaging: Knee-R MR w contrast: No results found for this or any previous visit.  Knee-L MR w/o contrast: No results found for this or any previous visit.  Knee-R MR w/wo contrast: No results found for this or any previous visit.  Knee-L MR w/wo contrast: No results found for this or any previous visit.  Knee-R MR wo contrast: No results found for this or any previous visit.  Knee-L MR wo contrast: No results found for this or any previous visit.  Knee-R CT w contrast: Results for orders placed during the hospital encounter of 10/16/20  CT KNEE RIGHT W CONTRAST  Narrative CLINICAL DATA:  Right knee pain and spasms. Diabetic patient with streptococcal bacteremia and sepsis.  EXAM: CT OF THE RIGHT KNEE WITH CONTRAST  TECHNIQUE: Multidetector CT imaging was performed following the standard protocol during bolus administration of intravenous contrast.  CONTRAST:  75 mL OMNIPAQUE IOHEXOL 350 MG/ML SOLN  COMPARISON:   None.  FINDINGS: Bones/Joint/Cartilage  The exam is degraded by patient motion despite repeat imaging. No fracture or focal bone lesion is seen. No joint effusion is identified. Chondrocalcinosis of the menisci is noted.  Ligaments  Suboptimally assessed by CT.  Muscles and Tendons  Grossly intact.  Soft tissues  There is some subcutaneous edema about the knee and visualized lower leg, worse on the lower leg. No focal fluid collection. No soft tissue gas. No radiopaque foreign body.  IMPRESSION: Motion degraded exam. No acute bony or joint abnormality is identified.  Subcutaneous edema about the knee and lower leg could be due to dependent change or cellulitis. No focal fluid collection is seen.   Electronically Signed By: Drusilla Kanner M.D. On: 10/26/2020 15:37  Knee-L CT w contrast: No results found for this or any previous visit.  Knee-R CT w/wo contrast: No results found for this or any previous visit.  Knee-L CT w/wo contrast: No results found for this or any previous visit.  Knee-R CT wo contrast: No results found for this or any previous visit.  Knee-L CT wo contrast: No results found for this or any previous visit.  Knee-R DG 1-2 views: No results found for this or any previous visit.  Knee-L DG 1-2 views: No results found for this or any previous visit.  Knee-R DG 3 views: No results found for this or any previous visit.  Knee-L DG 3 views: No results found for this or any previous visit.  Knee-R DG 4 views: No results found for this or any previous visit.  Knee-L DG 4 views: No results found for this or any previous visit.  Knee-R DG Arthrogram: No results found for this or any  previous visit.  Knee-L DG Arthrogram: No results found for this or any previous visit.   Ankle Imaging: Ankle-R DG Complete: No results found for this or any previous visit.  Ankle-L DG Complete: No results found for this or any previous visit.   Foot  Imaging: Foot-R DG Complete: No results found for this or any previous visit.  Foot-L DG Complete: No results found for this or any previous visit.   Elbow Imaging: Elbow-R DG Complete: No results found for this or any previous visit.  Elbow-L DG Complete: No results found for this or any previous visit.   Wrist Imaging: Wrist-R DG Complete: No results found for this or any previous visit.  Wrist-L DG Complete: No results found for this or any previous visit.   Hand Imaging: Hand-R DG Complete: No results found for this or any previous visit.  Hand-L DG Complete: No results found for this or any previous visit.   Complexity Note: Imaging results reviewed.                         ROS  Cardiovascular: {Hx; Cardiovascular History:210120525} Pulmonary or Respiratory: {Hx; Pumonary and/or Respiratory History:210120523} Neurological: {Hx; Neurological:210120504} Psychological-Psychiatric: {Hx; Psychological-Psychiatric History:210120512} Gastrointestinal: {Hx; Gastrointestinal:210120527} Genitourinary: {Hx; Genitourinary:210120506} Hematological: {Hx; Hematological:210120510} Endocrine: {Hx; Endocrine history:210120509} Rheumatologic: {Hx; Rheumatological:210120530} Musculoskeletal: {Hx; Musculoskeletal:210120528} Work History: {Hx; Work history:210120514}  Allergies  Mr. Riordan has No Known Allergies.  Laboratory Chemistry Profile   Renal Lab Results  Component Value Date   BUN 25 (H) 04/16/2022   CREATININE 0.92 04/16/2022   GFRAA 55 (L) 12/15/2019   GFRNONAA >60 04/16/2022   PROTEINUR >=300 (A) 04/16/2022     Electrolytes Lab Results  Component Value Date   NA 137 04/16/2022   K 4.5 04/16/2022   CL 107 04/16/2022   CALCIUM 9.2 04/16/2022   MG 1.7 04/09/2021   PHOS 2.9 03/17/2021     Hepatic Lab Results  Component Value Date   AST 14 (L) 04/16/2022   ALT <5 04/16/2022   ALBUMIN 3.5 04/16/2022   ALKPHOS 106 04/16/2022   LIPASE 28 04/16/2022      ID Lab Results  Component Value Date   SARSCOV2NAA NEGATIVE 04/04/2021     Bone No results found for: "VD25OH", "VD125OH2TOT", "YQ6578IO9", "GE9528UX3", "25OHVITD1", "25OHVITD2", "25OHVITD3", "TESTOFREE", "TESTOSTERONE"   Endocrine Lab Results  Component Value Date   GLUCOSE 117 (H) 04/16/2022   GLUCOSEU NEGATIVE 04/16/2022   HGBA1C 6.8 (H) 03/16/2021   TSH 1.627 01/11/2021     Neuropathy Lab Results  Component Value Date   VITAMINB12 355 10/27/2020   FOLATE 8.4 10/27/2020   HGBA1C 6.8 (H) 03/16/2021     CNS No results found for: "COLORCSF", "APPEARCSF", "RBCCOUNTCSF", "WBCCSF", "POLYSCSF", "LYMPHSCSF", "EOSCSF", "PROTEINCSF", "GLUCCSF", "JCVIRUS", "CSFOLI", "IGGCSF", "LABACHR", "ACETBL"   Inflammation (CRP: Acute  ESR: Chronic) Lab Results  Component Value Date   CRP 21.0 (H) 10/19/2020   ESRSEDRATE 65 (H) 10/19/2020   LATICACIDVEN 1.6 04/04/2021     Rheumatology No results found for: "RF", "ANA", "LABURIC", "URICUR", "LYMEIGGIGMAB", "LYMEABIGMQN", "HLAB27"   Coagulation Lab Results  Component Value Date   INR 2.1 (H) 03/17/2021   LABPROT 23.3 (H) 03/17/2021   APTT 39 (H) 03/16/2021   PLT 243 04/16/2022   DDIMER <0.27 03/22/2021     Cardiovascular Lab Results  Component Value Date   BNP 56.3 02/02/2021   HGB 13.5 04/16/2022   HCT 42.9 04/16/2022     Screening Lab Results  Component  Value Date   SARSCOV2NAA NEGATIVE 04/04/2021     Cancer No results found for: "CEA", "CA125", "LABCA2"   Allergens No results found for: "ALMOND", "APPLE", "ASPARAGUS", "AVOCADO", "BANANA", "BARLEY", "BASIL", "BAYLEAF", "GREENBEAN", "LIMABEAN", "WHITEBEAN", "BEEFIGE", "REDBEET", "BLUEBERRY", "BROCCOLI", "CABBAGE", "MELON", "CARROT", "CASEIN", "CASHEWNUT", "CAULIFLOWER", "CELERY"     Note: Lab results reviewed.  PFSH  Drug: Mr. Mcminn  reports that he does not currently use drugs after having used the following drugs: "Crack" cocaine. Alcohol:  reports that he does not  currently use alcohol. Tobacco:  reports that he quit smoking about 18 years ago. His smoking use included cigarettes. He has a 5.00 pack-year smoking history. He has never used smokeless tobacco. Medical:  has a past medical history of Allergic rhinitis, Anxiety, Arthritis, Chronic indwelling Foley catheter, COPD (chronic obstructive pulmonary disease) (HCC), Cough, Diabetes (HCC), Essential (primary) hypertension, Hemorrhoid, Hyperlipidemia, PAF (paroxysmal atrial fibrillation) (HCC), Primary osteoarthritis, unspecified shoulder, Retention of urine, unspecified, Spinal stenosis, and UTI (urinary tract infection). Family: family history includes Healthy in his son and son.  Past Surgical History:  Procedure Laterality Date   APPENDECTOMY     BACK SURGERY     COLONOSCOPY N/A 10/27/2020   Procedure: COLONOSCOPY;  Surgeon: Wyline Mood, MD;  Location: [facility] ENDOSCOPY;  Service: Gastroenterology;  Laterality: N/A;   ELBOW SURGERY     ESOPHAGOGASTRODUODENOSCOPY (EGD) WITH PROPOFOL N/A 10/27/2020   Procedure: ESOPHAGOGASTRODUODENOSCOPY (EGD) WITH PROPOFOL;  Surgeon: Wyline Mood, MD;  Location: [facility] ENDOSCOPY;  Service: Gastroenterology;  Laterality: N/A;   HERNIA REPAIR     KNEE SURGERY     neck     PARTIAL HIP ARTHROPLASTY Bilateral    TEE WITHOUT CARDIOVERSION N/A 10/19/2020   Procedure: TRANSESOPHAGEAL ECHOCARDIOGRAM (TEE);  Surgeon: Antonieta Iba, MD;  Location: [facility] ORS;  Service: Cardiovascular;  Laterality: N/A;   TONSILLECTOMY     Active Ambulatory Problems    Diagnosis Date Noted   AF (paroxysmal atrial fibrillation) 05/31/2015   Benign prostatic hyperplasia with lower urinary tract symptoms 08/06/2017   Calcific tendinitis of left shoulder 07/05/2015   Chronic pain 05/31/2015   Chronic pain of left wrist 04/24/2015   Acute pain of right knee 08/03/2015   COPD with acute exacerbation 05/31/2015   COVID-19 virus detected 07/03/2018   Arthritis of left wrist 04/24/2015   Essential  hypertension 05/31/2015   Gait difficulty 06/26/2015   Osteoarthritis of right knee 06/19/2017   Impaired functional mobility, balance, gait, and endurance 09/15/2019   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/05/2015   Rupture of extensor tendon of left hand 04/24/2015   S/P orthopedic surgery, follow-up exam 10/25/2016   Severe mitral regurgitation 06/05/2015   Tendon rupture of wrist, sequela 01/02/2016   Catheter cystitis 10/16/2018   Chronic respiratory failure with hypoxia 01/10/2020   Sepsis 10/16/2020   Neck pain    AKI (acute kidney injury)    Type 2 diabetes mellitus with diabetic neuropathy, without long-term current use of insulin    Parkinson disease    Ileus    Respiratory failure 01/09/2021   Obstipation 01/30/2021   Anxiety 01/30/2021   Iron deficiency anemia    Edema leg    Severe sepsis with septic shock 03/16/2021   E. 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   Rotator cuff tear arthropathy of right shoulder 09/28/2015   COPD (chronic obstructive pulmonary disease) 05/31/2015   Resolved Ambulatory Problems    Diagnosis Date Noted   No Resolved Ambulatory Problems   Past Medical History:  Diagnosis Date   Allergic rhinitis    Arthritis    Chronic indwelling Foley catheter    Cough    Diabetes (HCC)    Essential (primary) hypertension    Hemorrhoid    Hyperlipidemia    PAF (paroxysmal atrial fibrillation) (HCC)    Primary osteoarthritis, unspecified shoulder    Retention of urine, unspecified    Spinal stenosis    UTI (urinary tract infection)    Constitutional Exam  General appearance: Well nourished, well  developed, and well hydrated. In no apparent acute distress There were no vitals filed for this visit. BMI Assessment: Estimated body mass index is 33.67 kg/m as calculated from the following:   Height as of 04/16/22:  (1.702 m).   Weight as of 04/16/22: 214 lb 15.2 oz (97.5 kg).  BMI interpretation table: BMI level Category Range association with higher incidence of chronic pain  <18 kg/m2 Underweight   18.5-24.9 kg/m2 Ideal body weight   25-29.9 kg/m2 Overweight Increased incidence by 20%  30-34.9 kg/m2 Obese (Class I) Increased incidence by 68%  35-39.9 kg/m2 Severe obesity (Class II) Increased incidence by 136%  >40 kg/m2 Extreme obesity (Class III) Increased incidence by 254%   Patient's current BMI Ideal Body weight  There is no height or weight on file to calculate BMI. Patient weight not recorded   BMI Readings from Last 4 Encounters:  04/16/22 33.67 kg/m  12/05/21 34.46 kg/m  04/05/21 35.77 kg/m  03/26/21 34.22 kg/m   Wt Readings from Last 4 Encounters:  04/16/22 214 lb 15.2 oz (97.5 kg)  12/05/21 220 lb (99.8 kg)  04/05/21 235 lb 3.7 oz (106.7 kg)  03/26/21 225 lb 1.4 oz (102.1 kg)    Psych/Mental status: Alert, oriented x 3 (person, place, & time)       Eyes: PERLA Respiratory: No evidence of acute respiratory distress  Assessment  Primary Diagnosis & Pertinent Problem List: There were no encounter diagnoses.  Visit Diagnosis (New problems to examiner): No diagnosis found. Plan of Care (Initial workup plan)  Note: Mr. Walkowski was reminded that as per protocol, today's visit has been an evaluation only. We have not taken over the patient's controlled substance management.  Problem-specific plan: No problem-specific Assessment & Plan notes found for this encounter.  Lab Orders  No laboratory test(s) ordered today   Imaging Orders  No imaging studies ordered today   Referral Orders  No referral(s) requested today   Procedure Orders    No  procedure(s) ordered today   Pharmacotherapy (current): Medications ordered:  No orders of the defined types were placed in this encounter.  Medications administered during this visit: Teryn Gust had no medications administered during this visit.   Analgesic Pharmacotherapy:  Opioid Analgesics: For patients currently taking or requesting to take opioid analgesics, in accordance with [facility] Guidelines, we will assess their risks and indications for the use of these substances. After completing our evaluation, we may offer recommendations, but we no longer take patients for medication management. The prescribing physician will ultimately decide, based on his/her training and level of comfort whether to adopt any of the recommendations, including whether or not  to prescribe such medicines.  Membrane stabilizer: To be determined at a later time  Muscle relaxant: To be determined at a later time  NSAID: To be determined at a later time  Other analgesic(s): To be determined at a later time   Interventional management options: Mr. Phenix was informed that there is no guarantee that he would be a candidate for interventional therapies. The decision will be based on the results of diagnostic studies, as well as Mr. Rishel's risk profile.  Procedure(s) under consideration:  Pending results of ordered studies      Interventional Therapies  Risk Factors  Considerations:     Planned  Pending:      Under consideration:   Pending   Completed:   None at this time   Therapeutic  Palliative (PRN) options:   None established   Completed by other providers:   None reported       Provider-requested follow-up: No follow-ups on file.  Future Appointments  Date Time Provider Department Center  07/24/2022  1:00 PM Delano Metz, MD [facility] None    Duration of encounter: *** minutes.  Total time on encounter, as per AMA guidelines included both the face-to-face and  non-face-to-face time personally spent by the physician and/or other qualified health care professional(s) on the day of the encounter (includes time in activities that require the physician or other qualified health care professional and does not include time in activities normally performed by clinical staff). Physician's time may include the following activities when performed: Preparing to see the patient (e.g., pre-charting review of records, searching for previously ordered imaging, lab work, and nerve conduction tests) Review of prior analgesic pharmacotherapies. Reviewing PMP Interpreting ordered tests (e.g., lab work, imaging, nerve conduction tests) Performing post-procedure evaluations, including interpretation of diagnostic procedures Obtaining and/or reviewing separately obtained history Performing a medically appropriate examination and/or evaluation Counseling and educating the patient/family/caregiver Ordering medications, tests, or procedures Referring and communicating with other health care professionals (when not separately reported) Documenting clinical information in the electronic or other health record Independently interpreting results (not separately reported) and communicating results to the patient/ family/caregiver Care coordination (not separately reported)  Note by: Oswaldo Done, MD (TTS technology used. I apologize for any typographical errors that were not detected and corrected.) Date: 07/24/2022; Time: 5:18 PM

## 2022-07-24 ENCOUNTER — Ambulatory Visit: Payer: No Typology Code available for payment source | Attending: Pain Medicine | Admitting: Pain Medicine

## 2022-07-24 ENCOUNTER — Other Ambulatory Visit
Admission: RE | Admit: 2022-07-24 | Discharge: 2022-07-24 | Disposition: A | Discharge status: Home / Self Care | Payer: No Typology Code available for payment source | Source: Ambulatory Visit | Attending: Pain Medicine | Admitting: Pain Medicine

## 2022-07-24 ENCOUNTER — Ambulatory Visit
Admission: RE | Admit: 2022-07-24 | Discharge: 2022-07-24 | Disposition: A | Discharge status: Home / Self Care | Payer: No Typology Code available for payment source | Source: Ambulatory Visit | Attending: Pain Medicine | Admitting: Pain Medicine

## 2022-07-24 ENCOUNTER — Encounter: Payer: Self-pay | Admitting: Pain Medicine

## 2022-07-24 VITALS — BP 119/67 | HR 52 | Temp 97.6°F | Resp 16 | Ht 67.0 in | Wt 214.0 lb

## 2022-07-24 DIAGNOSIS — M549 Dorsalgia, unspecified: Secondary | ICD-10-CM | POA: Diagnosis present

## 2022-07-24 DIAGNOSIS — G8929 Other chronic pain: Secondary | ICD-10-CM | POA: Diagnosis present

## 2022-07-24 DIAGNOSIS — R700 Elevated erythrocyte sedimentation rate: Secondary | ICD-10-CM | POA: Diagnosis present

## 2022-07-24 DIAGNOSIS — Z9889 Other specified postprocedural states: Secondary | ICD-10-CM | POA: Diagnosis present

## 2022-07-24 DIAGNOSIS — R937 Abnormal findings on diagnostic imaging of other parts of musculoskeletal system: Secondary | ICD-10-CM

## 2022-07-24 DIAGNOSIS — G894 Chronic pain syndrome: Secondary | ICD-10-CM | POA: Insufficient documentation

## 2022-07-24 DIAGNOSIS — Z7901 Long term (current) use of anticoagulants: Secondary | ICD-10-CM | POA: Insufficient documentation

## 2022-07-24 DIAGNOSIS — R7982 Elevated C-reactive protein (CRP): Secondary | ICD-10-CM | POA: Insufficient documentation

## 2022-07-24 DIAGNOSIS — Z79899 Other long term (current) drug therapy: Secondary | ICD-10-CM | POA: Insufficient documentation

## 2022-07-24 DIAGNOSIS — M542 Cervicalgia: Secondary | ICD-10-CM | POA: Insufficient documentation

## 2022-07-24 DIAGNOSIS — Z789 Other specified health status: Secondary | ICD-10-CM | POA: Diagnosis present

## 2022-07-24 DIAGNOSIS — M899 Disorder of bone, unspecified: Secondary | ICD-10-CM | POA: Diagnosis present

## 2022-07-24 LAB — COMPREHENSIVE METABOLIC PANEL
ALT: 5 U/L (ref 0–44)
AST: 14 U/L — ABNORMAL LOW (ref 15–41)
Albumin: 3.8 g/dL (ref 3.5–5.0)
Alkaline Phosphatase: 130 U/L — ABNORMAL HIGH (ref 38–126)
Anion gap: 8 (ref 5–15)
BUN: 39 mg/dL — ABNORMAL HIGH (ref 8–23)
CO2: 22 mmol/L (ref 22–32)
Calcium: 9 mg/dL (ref 8.9–10.3)
Chloride: 108 mmol/L (ref 98–111)
Creatinine, Ser: 0.91 mg/dL (ref 0.61–1.24)
GFR, Estimated: >60 mL/min (ref >60)
Glucose, Bld: 140 mg/dL — ABNORMAL HIGH (ref 70–99)
Potassium: 4 mmol/L (ref 3.5–5.1)
Sodium: 138 mmol/L (ref 135–145)
Total Bilirubin: 0.6 mg/dL (ref 0.3–1.2)
Total Protein: 7.6 g/dL (ref 6.5–8.1)

## 2022-07-24 LAB — SEDIMENTATION RATE: Sed Rate: 16 mm/hr (ref 0–20)

## 2022-07-24 LAB — VITAMIN D 25 HYDROXY (VIT D DEFICIENCY, FRACTURES): Vit D, 25-Hydroxy: 60.59 ng/mL (ref 30–100)

## 2022-07-24 LAB — VITAMIN B12: Vitamin B-12: 144 pg/mL — ABNORMAL LOW (ref 180–914)

## 2022-07-24 LAB — C-REACTIVE PROTEIN: CRP: 1.7 mg/dL — ABNORMAL HIGH (ref <1.0)

## 2022-07-24 LAB — MAGNESIUM: Magnesium: 2 mg/dL (ref 1.7–2.4)

## 2022-07-24 NOTE — Patient Instructions (Signed)
____________________________________________________________________________________________  New Patients  Welcome to [facility].   Initial Visit The first or initial visit consists of an evaluation only.   Interventional pain management.  We offer therapies other than opioid controlled substances to manage chronic pain. These include, but are not limited to, diagnostic, therapeutic, and palliative specialized injection therapies (i.e.: Epidural Steroids, Facet Blocks, etc.). We specialize in a variety of nerve blocks as well as radiofrequency treatments. We offer pain implant evaluations and trials, as well as follow up management. In addition we also provide a variety joint injections, including Viscosupplementation (AKA: Gel Therapy).  Prescription Pain Medication. We specialize in alternatives to opioids. We can provide evaluations and recommendations for/of pharmacologic therapies based on CDC Guidelines.  We no longer take patients for long-term medication management. We will not be taking over your pain medications.  ____________________________________________________________________________________________    ____________________________________________________________________________________________  Patient Information update  To: All of our patients.  Re: Name change.  It has been made official that our current name, "[facility]"   will soon be changed to "[facility]".   The purpose of this change is to eliminate any confusion created by the concept of our practice being a "Medication Management Pain Clinic". In the past this has led to the misconception that we treat pain primarily by the use of prescription medications.  Nothing can be farther from the truth.   Understanding PAIN MANAGEMENT: To  further understand what our practice does, you first have to understand that "Pain Management" is a subspecialty that requires additional training once a physician has completed their specialty training, which can be in either Anesthesia, Neurology, Psychiatry, or Physical Medicine and Rehabilitation (PMR). Each one of these contributes to the final approach taken by each physician to the management of their patient's pain. To be a "Pain Management Specialist" you must have first completed one of the specialty trainings below.  Anesthesiologists - trained in clinical pharmacology and interventional techniques such as nerve blockade and regional as well as central neuroanatomy. They are trained to block pain before, during, and after surgical interventions.  Neurologists - trained in the diagnosis and pharmacological treatment of complex neurological conditions, such as Multiple Sclerosis, Parkinson's, spinal cord injuries, and other systemic conditions that may be associated with symptoms that may include but are not limited to pain. They tend to rely primarily on the treatment of chronic pain using prescription medications.  Psychiatrist - trained in conditions affecting the psychosocial wellbeing of patients including but not limited to depression, anxiety, schizophrenia, personality disorders, addiction, and other substance use disorders that may be associated with chronic pain. They tend to rely primarily on the treatment of chronic pain using prescription medications.   Physical Medicine and Rehabilitation (PMR) physicians, also known as physiatrists - trained to treat a wide variety of medical conditions affecting the brain, spinal cord, nerves, bones, joints, ligaments, muscles, and tendons. Their training is primarily aimed at treating patients that have suffered injuries that have caused severe physical impairment. Their training is primarily aimed at the physical therapy and rehabilitation of those  patients. They may also work alongside orthopedic surgeons or neurosurgeons using their expertise in assisting surgical patients to recover after their surgeries.  INTERVENTIONAL PAIN MANAGEMENT is sub-subspecialty of Pain Management.  Our physicians are Board-certified in Anesthesia, Pain Management, and Interventional Pain Management.  This meaning that not only have they been trained   and Board-certified in their specialty of Anesthesia, and subspecialty of Pain Management, but they have also received further training in the sub-subspecialty of Interventional Pain Management, in order to become Board-certified as INTERVENTIONAL PAIN MANAGEMENT SPECIALIST.    Mission: Our goal is to use our skills in  INTERVENTIONAL PAIN MANAGEMENT as alternatives to the chronic use of prescription opioid medications for the treatment of pain. To make this more clear, we have changed our name to reflect what we do and offer. We will continue to offer medication management assessment and recommendations, but we will not be taking over any patient's medication management.  ____________________________________________________________________________________________     

## 2022-07-25 ENCOUNTER — Encounter: Payer: Self-pay | Admitting: Pain Medicine

## 2022-07-25 DIAGNOSIS — E538 Deficiency of other specified B group vitamins: Secondary | ICD-10-CM | POA: Insufficient documentation

## 2022-07-31 LAB — COMPLIANCE DRUG ANALYSIS, UR

## 2022-08-04 NOTE — Progress Notes (Unsigned)
PROVIDER NOTE: Information contained herein reflects review and annotations entered in association with encounter. Interpretation of such information and data should be left to medically-trained personnel. Information provided to patient can be located elsewhere in the medical record under "Patient Instructions". Document created using STT-dictation technology, any transcriptional errors that may result from process are unintentional.    Patient: Raymond Hays  Service Category: E/M  Provider: Oswaldo Done, MD  DOB: 14-Jan-1946  DOS: 08/05/2022  Referring Provider: [facility]  MRN: [id number]  Specialty: Interventional Pain Management  [facility]P: [facility]  Type: Established Patient  Setting: Ambulatory outpatient    Location: Office  Delivery: Face-to-face     Primary Reason(s) for Visit: Encounter for evaluation before starting new chronic pain management plan of care (Level of risk: moderate) CC: No chief complaint on file.  HPI  Raymond Hays is a 77 y.o. year old, male patient, who comes today for a follow-up evaluation to review the test results and decide on a treatment plan. He has AF (paroxysmal atrial fibrillation) (HCC); Benign prostatic hyperplasia with lower urinary tract symptoms; Calcific tendinitis of left shoulder; Chronic pain syndrome; Chronic pain of left wrist; Acute pain of right knee; COPD with acute exacerbation (HCC); COVID-19 virus detected; Arthritis of left wrist; Essential hypertension; Gait difficulty; Osteoarthritis of right knee; Impaired functional mobility, balance, gait, and endurance; Left rotator cuff tear arthropathy; Rupture of extensor tendon of left hand; S/P orthopedic surgery, follow-up exam; Severe mitral regurgitation; Tendon rupture of wrist, sequela; Catheter cystitis (HCC); Chronic respiratory failure with hypoxia (HCC); Sepsis (HCC); Neck pain; AKI (acute kidney injury) (HCC); Type 2 diabetes mellitus with diabetic neuropathy, without long-term current  use of insulin (HCC); Parkinson disease; Ileus (HCC); Respiratory failure (HCC); Obstipation; Anxiety; Iron deficiency anemia; Edema leg; Severe sepsis with septic shock (HCC); E. coli UTI; Herpes zoster conjunctivitis of left eye; Acute respiratory failure (HCC); Status post implantation of mitral valve leaflet clip; Laryngopharyngeal reflux; Throat clearing; Chronic pain of right knee; Atrial fibrillation (HCC); Urinary retention; Primary osteoarthritis of left shoulder; Rotator cuff tear arthropathy of right shoulder; COPD (chronic obstructive pulmonary disease) (HCC); Pharmacologic therapy; Disorder of skeletal system; Problems influencing health status; Elevated sed rate; Elevated C-reactive protein (CRP); Chronic anticoagulation (ELIQUIS); Chronic neck and back pain (1ry area of Pain) (Bilateral) (L>R); Abnormal MRI, cervical spine (10/27/2020); History of cervical spinal surgery; History of lumbar surgery; History of thoracic surgery; and Vitamin B12 deficiency on their problem list. His primarily concern today is the No chief complaint on file.  Pain Assessment: Location:     Radiating:   Onset:   Duration:   Quality:   Severity:  /10 (subjective, self-reported pain score)  Effect on ADL:   Timing:   Modifying factors:   BP:    HR:    Raymond Hays comes in today for a follow-up visit after his initial evaluation on 07/24/2022. Today we went over the results of his tests. These were explained in "Layman's terms". During today's appointment we went over my diagnostic impression, as well as the proposed treatment plan.  ***  Patient presented with interventional treatment options. Raymond Hays was informed that I will not be providing medication management. Pharmacotherapy evaluation including recommendations may be offered, if specifically requested.   Controlled Substance Pharmacotherapy Assessment REMS (Risk Evaluation and Mitigation Strategy)  Opioid Analgesic: None MME/day: 0 mg/day  Pill  Count: None expected due to no prior prescriptions written by our practice. No notes on file Pharmacokinetics: Liberation and absorption (  onset of action): WNL Distribution (time to peak effect): WNL Metabolism and excretion (duration of action): WNL         Pharmacodynamics: Desired effects: Analgesia: Raymond Hays reports >50% benefit. Functional ability: Patient reports that medication allows him to accomplish basic ADLs Clinically meaningful improvement in function (CMIF): Sustained CMIF goals met Perceived effectiveness: Described as relatively effective, allowing for increase in activities of daily living (ADL) Undesirable effects: Side-effects or Adverse reactions: None reported Monitoring: [place] PMP: PDMP reviewed during this encounter. Online review of the past 37-month period previously conducted. Not applicable at this point since we have not taken over the patient's medication management yet. List of other Serum/Urine Drug Screening Test(s):  No results found for: "AMPHSCRSER", "BARBSCRSER", "BENZOSCRSER", "COCAINSCRSER", "COCAINSCRNUR", "PCPSCRSER", "THCSCRSER", "THCU", "CANNABQUANT", "OPIATESCRSER", "OXYSCRSER", "PROPOXSCRSER", "ETH", "CBDTHCR", "D8THCCBX", "D9THCCBX" List of all UDS test(s) done:  Lab Results  Component Value Date   SUMMARY Note 07/24/2022   Last UDS on record: Summary  Date Value Ref Range Status  07/24/2022 Note  Final    Comment:    ==================================================================== Compliance Drug Analysis, Ur ==================================================================== Test                             Result       Flag       Units  Drug Present and Declared for Prescription Verification   Gabapentin                     PRESENT      EXPECTED   Methocarbamol                  PRESENT      EXPECTED   Acetaminophen                  PRESENT      EXPECTED  Drug Present not Declared for Prescription Verification   Tizanidine                      PRESENT      UNEXPECTED   Citalopram                     PRESENT      UNEXPECTED   Desmethylcitalopram            PRESENT      UNEXPECTED    Desmethylcitalopram is an expected metabolite of citalopram or the    enantiomeric form, escitalopram.  Drug Absent but Declared for Prescription Verification   Tramadol                       Not Detected UNEXPECTED ng/mg creat   Duloxetine                     Not Detected UNEXPECTED   Trazodone                      Not Detected UNEXPECTED   Hydroxyzine                    Not Detected UNEXPECTED   Guaifenesin                    Not Detected UNEXPECTED   Metoprolol                     Not Detected UNEXPECTED ====================================================================  Test                      Result    Flag   Units      Ref Range   Creatinine              53               mg/dL      >=14 ==================================================================== Declared Medications:  The flagging and interpretation on this report are based on the  following declared medications.  Unexpected results may arise from  inaccuracies in the declared medications.   **Note: The testing scope of this panel includes these medications:   Duloxetine (Cymbalta)  Gabapentin (Neurontin)  Guaifenesin (Mucinex)  Hydroxyzine (Atarax)  Methocarbamol (Robaxin)  Metoprolol (Lopressor)  Tramadol (Ultram)  Trazodone (Desyrel)   **Note: The testing scope of this panel does not include small to  moderate amounts of these reported medications:   Acetaminophen (Tylenol)   **Note: The testing scope of this panel does not include the  following reported medications:   Acetic Acid  Acyclovir (Zovirax)  Albuterol  Albuterol (Duoneb)  Apixaban (Eliquis)  Atorvastatin (Lipitor)  Carbidopa (Sinemet)  Cetirizine (Zyrtec)  Docusate (Colace)  Dupilumab (Dupixent)  Eye Drops  Fluticasone (Flonase)  Fluticasone (Trelegy)  Furosemide (Lasix)   Hydrocortisone  Ipratropium (Atrovent)  Ipratropium (Duoneb)  Iron  Levodopa (Sinemet)  Levothyroxine (Synthroid)  Metformin (Glucophage)  Metoclopramide (Reglan)  Montelukast (Singulair)  Pantoprazole (Protonix)  Psyllium (Metamucil)  Tacrolimus (Protopic)  Topical  Torsemide (Demadex)  Triamcinolone (Kenalog)  Umeclidinium (Trelegy)  Vilanterol (Trelegy) ==================================================================== For clinical consultation, please call [phone]. ====================================================================    UDS interpretation: No unexpected findings.          Medication Assessment Form: Not applicable. No opioids. Treatment compliance: Not applicable Risk Assessment Profile: Aberrant behavior: See initial evaluations. None observed or detected today Comorbid factors increasing risk of overdose: See initial evaluation. No additional risks detected today Opioid risk tool (ORT):     07/24/2022    1:09 PM  Opioid Risk   Illegal Drugs 4  Rx Drugs 5  Opioid Risk Tool Scoring 9  Opioid Risk Interpretation High Risk    ORT Scoring interpretation table:  Score <3 = Low Risk for SUD  Score between 4-7 = Moderate Risk for SUD  Score >8 = High Risk for Opioid Abuse   Risk of substance use disorder (SUD): Low  Risk Mitigation Strategies:  Patient opioid safety counseling: No controlled substances prescribed. Patient-Prescriber Agreement (PPA): No agreement signed.  Controlled substance notification to other providers: None required. No opioid therapy.  Pharmacologic Plan: Non-opioid analgesic therapy offered. Interventional alternatives discussed.             Laboratory Chemistry Profile   Renal Lab Results  Component Value Date   BUN 39 (H) 07/24/2022   CREATININE 0.91 07/24/2022   GFRAA 55 (L) 12/15/2019   GFRNONAA >60 07/24/2022   PROTEINUR >=300 (A) 04/16/2022     Electrolytes Lab Results  Component Value Date   NA 138  07/24/2022   K 4.0 07/24/2022   CL 108 07/24/2022   CALCIUM 9.0 07/24/2022   MG 2.0 07/24/2022   PHOS 2.9 03/17/2021     Hepatic Lab Results  Component Value Date   AST 14 (L) 07/24/2022   ALT 5 07/24/2022   ALBUMIN 3.8 07/24/2022   ALKPHOS 130 (H) 07/24/2022   LIPASE 28 04/16/2022     ID Lab Results  Component Value Date   SARSCOV2NAA NEGATIVE 04/04/2021     Bone Lab Results  Component Value Date   VD25OH 60.59 07/24/2022     Endocrine Lab Results  Component Value Date   GLUCOSE 140 (H) 07/24/2022   GLUCOSEU NEGATIVE 04/16/2022   HGBA1C 6.8 (H) 03/16/2021   TSH 1.627 01/11/2021     Neuropathy Lab Results  Component Value Date   VITAMINB12 144 (L) 07/24/2022   FOLATE 8.4 10/27/2020   HGBA1C 6.8 (H) 03/16/2021     CNS No results found for: "COLORCSF", "APPEARCSF", "RBCCOUNTCSF", "WBCCSF", "POLYSCSF", "LYMPHSCSF", "EOSCSF", "PROTEINCSF", "GLUCCSF", "JCVIRUS", "CSFOLI", "IGGCSF", "LABACHR", "ACETBL"   Inflammation (CRP: Acute  ESR: Chronic) Lab Results  Component Value Date   CRP 1.7 (H) 07/24/2022   ESRSEDRATE 16 07/24/2022   LATICACIDVEN 1.6 04/04/2021     Rheumatology No results found for: "RF", "ANA", "LABURIC", "URICUR", "LYMEIGGIGMAB", "LYMEABIGMQN", "HLAB27"   Coagulation Lab Results  Component Value Date   INR 2.1 (H) 03/17/2021   LABPROT 23.3 (H) 03/17/2021   APTT 39 (H) 03/16/2021   PLT 243 04/16/2022   DDIMER <0.27 03/22/2021     Cardiovascular Lab Results  Component Value Date   BNP 56.3 02/02/2021   HGB 13.5 04/16/2022   HCT 42.9 04/16/2022     Screening Lab Results  Component Value Date   SARSCOV2NAA NEGATIVE 04/04/2021     Cancer No results found for: "CEA", "CA125", "LABCA2"   Allergens No results found for: "ALMOND", "APPLE", "ASPARAGUS", "AVOCADO", "BANANA", "BARLEY", "BASIL", "BAYLEAF", "GREENBEAN", "LIMABEAN", "WHITEBEAN", "BEEFIGE", "REDBEET", "BLUEBERRY", "BROCCOLI", "CABBAGE", "MELON", "CARROT", "CASEIN",  "CASHEWNUT", "CAULIFLOWER", "CELERY"     Note: Lab results reviewed.  Recent Diagnostic Imaging Review  Cervical Imaging: Cervical MR wo contrast: No results found for this or any previous visit.  Cervical MR wo contrast: No valid procedures specified. Cervical CT wo contrast: No results found for this or any previous visit.  Cervical DG Bending/F/E views: Results for orders placed during the hospital encounter of 07/24/22  DG Cervical Spine With Flex & Extend  Narrative CLINICAL DATA:  Cervical pain.  History of abnormal MRI.  EXAM: CERVICAL SPINE COMPLETE WITH FLEXION AND EXTENSION VIEWS  COMPARISON:  MRI cervical spine 10/26/2020. CT cervical spine 10/26/2020.  FINDINGS: Prevertebral soft tissue swelling noted from the skull base through C2, improved from prior MRI of 10/26/2020. Reference is made to prior MRI report of 10/26/2020. Prior C4-C5 corpectomy again noted. Prior anterior fusion C3 through C6 again noted. Prior posterior fusion C3 through C7 again noted. Hardware intact. Alignment unchanged. Severe osteopenia and diffuse degenerative change. Multilevel bilateral neural foraminal narrowing noted. No acute bony abnormality. Carotid vascular calcification. Pulmonary apices are clear.  IMPRESSION: 1. Prevertebral soft tissue swelling noted from the skull base through C2, improved from prior MRI of 10/26/2020. Reference is made to prior MRI report of 10/26/2020. Repeat MRI can be obtained for comparison and further evaluation. 2. Prior C4-C5 corpectomy again noted. Prior anterior fusion C3 through C6 again noted. Prior posterior fusion C3 through C7 again noted. Hardware intact. Alignment unchanged. 3. Severe osteopenia and diffuse degenerative change. No acute bony abnormality. 4. Carotid vascular disease.   Electronically Signed By: Maisie Fus  Register M.D. On: 07/29/2022 07:14   Shoulder Imaging: Shoulder-R MR wo contrast: No results found for this or any  previous visit.  Shoulder-L MR wo contrast: No results found for this or any previous visit.  Shoulder-R DG: No results found for this or any previous visit.  Shoulder-L DG: No results found  for this or any previous visit.   Thoracic Imaging: Thoracic MR wo contrast: No results found for this or any previous visit.  Thoracic MR wo contrast: No valid procedures specified. Thoracic CT wo contrast: No results found for this or any previous visit.  Thoracic DG 4 views: No results found for this or any previous visit.  Thoracic DG w/swimmers view: No results found for this or any previous visit.   Lumbosacral Imaging: Lumbar MR wo contrast: No results found for this or any previous visit.  Lumbar MR wo contrast: No valid procedures specified. Lumbar CT wo contrast: No results found for this or any previous visit.  Lumbar DG Bending views: No results found for this or any previous visit.         Sacroiliac Joint Imaging: Sacroiliac Joint DG: No results found for this or any previous visit.   Hip Imaging: Hip-R MR wo contrast: No results found for this or any previous visit.  Hip-L MR wo contrast: No results found for this or any previous visit.  Hip-R CT wo contrast: No results found for this or any previous visit.  Hip-L CT wo contrast: No results found for this or any previous visit.  Hip-R DG 2-3 views: No results found for this or any previous visit.  Hip-L DG 2-3 views: No results found for this or any previous visit.  Hip-B DG Bilateral: No results found for this or any previous visit.   Knee Imaging: Knee-R MR wo contrast: No results found for this or any previous visit.  Knee-L MR wo contrast: No results found for this or any previous visit.  Knee-R CT wo contrast: No results found for this or any previous visit.  Knee-L CT wo contrast: No results found for this or any previous visit.  Knee-R DG 4 views: No results found for this or any previous  visit.  Knee-L DG 4 views: No results found for this or any previous visit.   Ankle Imaging: Ankle-R DG Complete: No results found for this or any previous visit.  Ankle-L DG Complete: No results found for this or any previous visit.   Foot Imaging: Foot-R DG Complete: No results found for this or any previous visit.  Foot-L DG Complete: No results found for this or any previous visit.   Elbow Imaging: Elbow-R DG Complete: No results found for this or any previous visit.  Elbow-L DG Complete: No results found for this or any previous visit.   Wrist Imaging: Wrist-R DG Complete: No results found for this or any previous visit.  Wrist-L DG Complete: No results found for this or any previous visit.   Hand Imaging: Hand-R DG Complete: No results found for this or any previous visit.  Hand-L DG Complete: No results found for this or any previous visit.   Complexity Note: Imaging results reviewed.                         Meds   Current Outpatient Medications:    acetaminophen (TYLENOL) 325 MG tablet, Take 650 mg by mouth every 4 (four) hours as needed for mild pain., Disp: , Rfl:    acetic acid 0.25 % irrigation, , Disp: , Rfl:    acyclovir (ZOVIRAX) 400 MG tablet, Take by mouth., Disp: , Rfl:    albuterol (PROVENTIL) (2.5 MG/3ML) 0.083% nebulizer solution, Take by nebulization., Disp: , Rfl:    Albuterol Sulfate (PROAIR RESPICLICK) 108 (90 Base) MCG/ACT AEPB, Inhale 2  puffs into the lungs every 4 (four) hours as needed (COPD)., Disp: , Rfl:    apixaban (ELIQUIS) 5 MG TABS tablet, Take 5 mg by mouth 2 (two) times daily. , Disp: , Rfl:    atorvastatin (LIPITOR) 20 MG tablet, Take 20 mg by mouth at bedtime., Disp: , Rfl:    carbidopa-levodopa (SINEMET IR) 25-100 MG tablet, Take 1 tablet by mouth 3 (three) times daily before meals., Disp: , Rfl:    [employer]ell[employer] Modified 1.5 % LIQD, , Disp: , Rfl:    cetirizine (ZYRTEC) 10 MG tablet, Take 10 mg by mouth daily., Disp: ,  Rfl:    docusate sodium (COLACE) 50 MG capsule, Take 50 mg by mouth daily., Disp: , Rfl:    DULoxetine (CYMBALTA) 20 MG capsule, Take 20 mg by mouth daily., Disp: , Rfl:    dupilumab (DUPIXENT) 300 MG/2ML prefilled syringe, Inject into the skin., Disp: , Rfl:    ferrous sulfate 325 (65 FE) MG EC tablet, Take 325 mg by mouth daily at 6 (six) AM., Disp: , Rfl:    fluticasone (FLONASE) 50 MCG/ACT nasal spray, Place 1 spray into both nostrils daily., Disp: , Rfl:    furosemide (LASIX) 40 MG tablet, Take 1 tablet (40 mg total) by mouth 2 (two) times daily for 7 days., Disp: , Rfl: 0   gabapentin (NEURONTIN) 800 MG tablet, Take 800 mg by mouth 2 (two) times daily. , Disp: , Rfl:    guaiFENesin (MUCINEX) 600 MG 12 hr tablet, Take by mouth., Disp: , Rfl:    hydrocortisone (ANUSOL-HC) 2.5 % rectal cream, Place rectally., Disp: , Rfl:    hydrocortisone (ANUSOL-HC) 25 MG suppository, Place rectally., Disp: , Rfl:    hydrocortisone 2.5 % ointment, Please apply to affected areas on face once daily as needed, Disp: , Rfl:    hydroxypropyl methylcellulose / hypromellose (ISOPTO TEARS / GONIOVISC) 2.5 % ophthalmic solution, 1 drop as needed for dry eyes., Disp: , Rfl:    hydrOXYzine (ATARAX/VISTARIL) 25 MG tablet, Take 25 mg by mouth 2 (two) times daily as needed for itching., Disp: , Rfl:    ipratropium (ATROVENT) 0.03 % nasal spray, 2 sprays every 12 (twelve) hours., Disp: , Rfl:    ipratropium-albuterol (DUONEB) 0.5-2.5 (3) MG/3ML SOLN, Inhale 3 mLs into the lungs every 6 (six) hours as needed., Disp: 360 mL, Rfl:    levothyroxine (SYNTHROID) 75 MCG tablet, Take by mouth., Disp: , Rfl:    metFORMIN (GLUCOPHAGE-XR) 500 MG 24 hr tablet, Take by mouth., Disp: , Rfl:    methocarbamol (ROBAXIN) 500 MG tablet, Take 1,000 mg by mouth in the morning and at bedtime. , Disp: , Rfl:    metoCLOPramide (REGLAN) 10 MG tablet, Take 1 tablet (10 mg total) by mouth 3 (three) times daily before meals for 14 days., Disp: , Rfl:  0   metoprolol tartrate (LOPRESSOR) 100 MG tablet, Take 100 mg by mouth daily at 6 (six) AM., Disp: , Rfl:    montelukast (SINGULAIR) 10 MG tablet, Take by mouth., Disp: , Rfl:    pantoprazole (PROTONIX) 40 MG tablet, Take 1 tablet (40 mg total) by mouth 2 (two) times daily before a meal. For 8 weeks. (Patient taking differently: Take 40 mg by mouth 2 (two) times daily before a meal.), Disp: , Rfl: 2   pantoprazole (PROTONIX) 40 MG tablet, Take by mouth., Disp: , Rfl:    psyllium (METAMUCIL) 58.6 % powder, Take 1 packet by mouth daily., Disp: , Rfl:  tacrolimus (PROTOPIC) 0.03 % ointment, Apply 1 Application topically 2 (two) times daily., Disp: , Rfl:    torsemide (DEMADEX) 10 MG tablet, Take by mouth., Disp: , Rfl:    traMADol (ULTRAM) 50 MG tablet, Take 100 mg by mouth., Disp: , Rfl:    traZODone (DESYREL) 100 MG tablet, Take by mouth., Disp: , Rfl:    TRELEGY ELLIPTA 100-62.5-25 MCG/INH AEPB, Inhale 1 puff into the lungs daily., Disp: , Rfl:    triamcinolone ointment (KENALOG) 0.1 %, Please apply to arms daily as needed. Never apply to face, Disp: , Rfl:   ROS  Constitutional: Denies any fever or chills Gastrointestinal: No reported hemesis, hematochezia, vomiting, or acute GI distress Musculoskeletal: Denies any acute onset joint swelling, redness, loss of ROM, or weakness Neurological: No reported episodes of acute onset apraxia, aphasia, dysarthria, agnosia, amnesia, paralysis, loss of coordination, or loss of consciousness  Allergies  Raymond Hays has No Known Allergies.  PFSH  Drug: Raymond Hays  reports that he does not currently use drugs after having used the following drugs: "Crack" cocaine. Alcohol:  reports that he does not currently use alcohol. Tobacco:  reports that he quit smoking about 18 years ago. His smoking use included cigarettes. He has a 5.00 pack-year smoking history. He has never used smokeless tobacco. Medical:  has a past medical history of Allergic rhinitis, Anxiety,  Arthritis, Atrial fibrillation (HCC), Chronic indwelling Foley catheter, COPD (chronic obstructive pulmonary disease) (HCC), Cough, Diabetes (HCC), Essential (primary) hypertension, Hemorrhoid, Hyperlipidemia, PAF (paroxysmal atrial fibrillation) (HCC), Primary osteoarthritis, unspecified shoulder, Retention of urine, unspecified, Spinal stenosis, and UTI (urinary tract infection). Surgical: Raymond Hays  has a past surgical history that includes Back surgery; neck; Hernia repair; Tonsillectomy; Appendectomy; Knee surgery; Elbow surgery; Partial hip arthroplasty (Bilateral); TEE without cardioversion (N/A, 10/19/2020); Esophagogastroduodenoscopy (egd) with propofol (N/A, 10/27/2020); and Colonoscopy (N/A, 10/27/2020). Family: family history includes Healthy in his son and son.  Constitutional Exam  General appearance: Well nourished, well developed, and well hydrated. In no apparent acute distress There were no vitals filed for this visit. BMI Assessment: Estimated body mass index is 33.52 kg/m as calculated from the following:   Height as of 07/24/22: 5\' 7"  (1.702 m).   Weight as of 07/24/22: 214 lb (97.1 kg).  BMI interpretation table: BMI level Category Range association with higher incidence of chronic pain  <18 kg/m2 Underweight   18.5-24.9 kg/m2 Ideal body weight   25-29.9 kg/m2 Overweight Increased incidence by 20%  30-34.9 kg/m2 Obese (Class I) Increased incidence by 68%  35-39.9 kg/m2 Severe obesity (Class II) Increased incidence by 136%  >40 kg/m2 Extreme obesity (Class III) Increased incidence by 254%   Patient's current BMI Ideal Body weight  There is no height or weight on file to calculate BMI. Ideal body weight: 66.1 kg (145 lb 11.6 oz) Adjusted ideal body weight: 78.5 kg (173 lb 0.6 oz)   BMI Readings from Last 4 Encounters:  07/24/22 33.52 kg/m  04/16/22 33.67 kg/m  12/05/21 34.46 kg/m  04/05/21 35.77 kg/m   Wt Readings from Last 4 Encounters:  07/24/22 214 lb (97.1 kg)   04/16/22 214 lb 15.2 oz (97.5 kg)  12/05/21 220 lb (99.8 kg)  04/05/21 235 lb 3.7 oz (106.7 kg)    Psych/Mental status: Alert, oriented x 3 (person, place, & time)       Eyes: PERLA Respiratory: No evidence of acute respiratory distress  Assessment & Plan  Primary Diagnosis & Pertinent Problem List: There were no encounter diagnoses.  Visit Diagnosis: No diagnosis found. Problems updated and reviewed during this visit: No problems updated.  Plan of Care  Pharmacotherapy (Medications Ordered): No orders of the defined types were placed in this encounter.  Procedure Orders    No procedure(s) ordered today   Lab Orders  No laboratory test(s) ordered today   Imaging Orders  No imaging studies ordered today   Referral Orders  No referral(s) requested today    Pharmacological management:  Opioid Analgesics: I will not be prescribing any opioids at this time Membrane stabilizer: I will not be prescribing any at this time Muscle relaxant: I will not be prescribing any at this time NSAID: I will not be prescribing any at this time Other analgesic(s): I will not be prescribing any at this time      Interventional Therapies  Risk Factors  Considerations:  Eliquis Anticoagulation: (Stop: 3 days  Restart: 6 hours)  A-fib  MVR (w/ leaflet clip implant)     Planned  Pending:   Diagnostic cervical x-ray with bending views  Possible repeat CT of the thoracic spine    Under consideration:   Pending   Completed:   None at this time   Therapeutic  Palliative (PRN) options:   None established   Completed by other providers:   None reported        Provider-requested follow-up: No follow-ups on file. Recent Visits Date Type Provider Dept  07/24/22 Office Visit Delano Metz, MD [facility]  Showing recent visits within past 90 days and meeting all other requirements Future Appointments Date Type Provider Dept  08/05/22 Appointment Delano Metz, MD [facility]  Showing future appointments within next 90 days and meeting all other requirements   Primary Care Physician: [facility]  Duration of encounter: *** minutes.  Total time on encounter, as per AMA guidelines included both the face-to-face and non-face-to-face time personally spent by the physician and/or other qualified health care professional(s) on the day of the encounter (includes time in activities that require the physician or other qualified health care professional and does not include time in activities normally performed by clinical staff). Physician's time may include the following activities when performed: Preparing to see the patient (e.g., pre-charting review of records, searching for previously ordered imaging, lab work, and nerve conduction tests) Review of prior analgesic pharmacotherapies. Reviewing PMP Interpreting ordered tests (e.g., lab work, imaging, nerve conduction tests) Performing post-procedure evaluations, including interpretation of diagnostic procedures Obtaining and/or reviewing separately obtained history Performing a medically appropriate examination and/or evaluation Counseling and educating the patient/family/caregiver Ordering medications, tests, or procedures Referring and communicating with other health care professionals (when not separately reported) Documenting clinical information in the electronic or other health record Independently interpreting results (not separately reported) and communicating results to the patient/ family/caregiver Care coordination (not separately reported)  Note by: Oswaldo Done, MD (TTS technology used. I apologize for any typographical errors that were not detected and corrected.) Date: 08/05/2022; Time: 3:57 PM

## 2022-08-05 ENCOUNTER — Ambulatory Visit: Payer: No Typology Code available for payment source | Attending: Pain Medicine | Admitting: Pain Medicine

## 2022-08-05 ENCOUNTER — Encounter: Payer: Self-pay | Admitting: Pain Medicine

## 2022-08-05 VITALS — BP 113/75 | HR 93 | Temp 97.5°F | Resp 18 | Ht 67.0 in | Wt 222.0 lb

## 2022-08-05 DIAGNOSIS — R413 Other amnesia: Secondary | ICD-10-CM | POA: Diagnosis present

## 2022-08-05 DIAGNOSIS — M549 Dorsalgia, unspecified: Secondary | ICD-10-CM | POA: Insufficient documentation

## 2022-08-05 DIAGNOSIS — Z95818 Presence of other cardiac implants and grafts: Secondary | ICD-10-CM | POA: Insufficient documentation

## 2022-08-05 DIAGNOSIS — M47814 Spondylosis without myelopathy or radiculopathy, thoracic region: Secondary | ICD-10-CM | POA: Diagnosis present

## 2022-08-05 DIAGNOSIS — M542 Cervicalgia: Secondary | ICD-10-CM | POA: Diagnosis present

## 2022-08-05 DIAGNOSIS — Z9889 Other specified postprocedural states: Secondary | ICD-10-CM

## 2022-08-05 DIAGNOSIS — I340 Nonrheumatic mitral (valve) insufficiency: Secondary | ICD-10-CM

## 2022-08-05 DIAGNOSIS — R29898 Other symptoms and signs involving the musculoskeletal system: Secondary | ICD-10-CM | POA: Diagnosis present

## 2022-08-05 DIAGNOSIS — N319 Neuromuscular dysfunction of bladder, unspecified: Secondary | ICD-10-CM

## 2022-08-05 DIAGNOSIS — M5442 Lumbago with sciatica, left side: Secondary | ICD-10-CM | POA: Diagnosis present

## 2022-08-05 DIAGNOSIS — M5134 Other intervertebral disc degeneration, thoracic region: Secondary | ICD-10-CM | POA: Diagnosis present

## 2022-08-05 DIAGNOSIS — G20A1 Parkinson's disease without dyskinesia, without mention of fluctuations: Secondary | ICD-10-CM | POA: Diagnosis present

## 2022-08-05 DIAGNOSIS — R269 Unspecified abnormalities of gait and mobility: Secondary | ICD-10-CM | POA: Diagnosis present

## 2022-08-05 DIAGNOSIS — Z7901 Long term (current) use of anticoagulants: Secondary | ICD-10-CM | POA: Diagnosis present

## 2022-08-05 DIAGNOSIS — Z7409 Other reduced mobility: Secondary | ICD-10-CM

## 2022-08-05 DIAGNOSIS — M21372 Foot drop, left foot: Secondary | ICD-10-CM | POA: Diagnosis present

## 2022-08-05 DIAGNOSIS — G8929 Other chronic pain: Secondary | ICD-10-CM | POA: Diagnosis present

## 2022-08-05 DIAGNOSIS — M961 Postlaminectomy syndrome, not elsewhere classified: Secondary | ICD-10-CM | POA: Diagnosis present

## 2022-08-05 DIAGNOSIS — R200 Anesthesia of skin: Secondary | ICD-10-CM

## 2022-08-05 DIAGNOSIS — M5030 Other cervical disc degeneration, unspecified cervical region: Secondary | ICD-10-CM | POA: Diagnosis present

## 2022-08-05 DIAGNOSIS — M4804 Spinal stenosis, thoracic region: Secondary | ICD-10-CM

## 2022-08-05 DIAGNOSIS — R937 Abnormal findings on diagnostic imaging of other parts of musculoskeletal system: Secondary | ICD-10-CM

## 2022-08-05 DIAGNOSIS — I4891 Unspecified atrial fibrillation: Secondary | ICD-10-CM | POA: Diagnosis present

## 2022-08-06 ENCOUNTER — Telehealth: Payer: Self-pay | Admitting: *Deleted

## 2022-08-16 ENCOUNTER — Ambulatory Visit (HOSPITAL_COMMUNITY)
Admission: RE | Admit: 2022-08-16 | Discharge: 2022-08-16 | Disposition: A | Discharge status: Home / Self Care | Payer: No Typology Code available for payment source | Source: Ambulatory Visit | Attending: Pain Medicine | Admitting: Pain Medicine

## 2022-08-16 DIAGNOSIS — M47814 Spondylosis without myelopathy or radiculopathy, thoracic region: Secondary | ICD-10-CM | POA: Diagnosis present

## 2022-08-16 DIAGNOSIS — M5030 Other cervical disc degeneration, unspecified cervical region: Secondary | ICD-10-CM | POA: Insufficient documentation

## 2022-08-16 DIAGNOSIS — Z9889 Other specified postprocedural states: Secondary | ICD-10-CM | POA: Diagnosis present

## 2022-08-16 DIAGNOSIS — Z7409 Other reduced mobility: Secondary | ICD-10-CM

## 2022-08-16 DIAGNOSIS — M5134 Other intervertebral disc degeneration, thoracic region: Secondary | ICD-10-CM

## 2022-08-16 DIAGNOSIS — M542 Cervicalgia: Secondary | ICD-10-CM

## 2022-08-16 DIAGNOSIS — R269 Unspecified abnormalities of gait and mobility: Secondary | ICD-10-CM | POA: Diagnosis present

## 2022-08-16 DIAGNOSIS — G8929 Other chronic pain: Secondary | ICD-10-CM

## 2022-08-16 DIAGNOSIS — M4804 Spinal stenosis, thoracic region: Secondary | ICD-10-CM | POA: Diagnosis present

## 2022-08-16 DIAGNOSIS — N319 Neuromuscular dysfunction of bladder, unspecified: Secondary | ICD-10-CM

## 2022-08-16 DIAGNOSIS — R937 Abnormal findings on diagnostic imaging of other parts of musculoskeletal system: Secondary | ICD-10-CM | POA: Diagnosis present

## 2022-08-16 DIAGNOSIS — M549 Dorsalgia, unspecified: Secondary | ICD-10-CM | POA: Insufficient documentation

## 2022-09-03 ENCOUNTER — Other Ambulatory Visit: Payer: Self-pay | Admitting: Pain Medicine

## 2022-09-03 ENCOUNTER — Encounter: Payer: Self-pay | Admitting: Pain Medicine

## 2022-09-03 DIAGNOSIS — R937 Abnormal findings on diagnostic imaging of other parts of musculoskeletal system: Secondary | ICD-10-CM

## 2022-09-03 NOTE — Progress Notes (Signed)
Referral entered for Neurosurgery to evaluate for thoracic decompression of severe spinal stenosis with evidence of myelomalacia.

## 2022-09-14 ENCOUNTER — Other Ambulatory Visit: Payer: Self-pay

## 2022-09-14 ENCOUNTER — Inpatient Hospital Stay
Admission: EM | Admit: 2022-09-14 | Discharge: 2022-09-17 | DRG: 178 | Disposition: A | Discharge status: Other Acute Inpatient Hospital | Payer: No Typology Code available for payment source | Source: Skilled Nursing Facility | Attending: Internal Medicine | Admitting: Internal Medicine

## 2022-09-14 ENCOUNTER — Emergency Department: Payer: No Typology Code available for payment source

## 2022-09-14 DIAGNOSIS — R5381 Other malaise: Secondary | ICD-10-CM | POA: Diagnosis present

## 2022-09-14 DIAGNOSIS — J690 Pneumonitis due to inhalation of food and vomit: Principal | ICD-10-CM | POA: Insufficient documentation

## 2022-09-14 DIAGNOSIS — R6520 Severe sepsis without septic shock: Secondary | ICD-10-CM | POA: Diagnosis present

## 2022-09-14 DIAGNOSIS — I480 Paroxysmal atrial fibrillation: Secondary | ICD-10-CM | POA: Diagnosis present

## 2022-09-14 DIAGNOSIS — Z22359 Carrier of enterobacterales, unspecified: Secondary | ICD-10-CM

## 2022-09-14 DIAGNOSIS — N390 Urinary tract infection, site not specified: Secondary | ICD-10-CM | POA: Diagnosis not present

## 2022-09-14 DIAGNOSIS — I2510 Atherosclerotic heart disease of native coronary artery without angina pectoris: Secondary | ICD-10-CM | POA: Diagnosis present

## 2022-09-14 DIAGNOSIS — I110 Hypertensive heart disease with heart failure: Secondary | ICD-10-CM | POA: Diagnosis present

## 2022-09-14 DIAGNOSIS — F419 Anxiety disorder, unspecified: Secondary | ICD-10-CM | POA: Diagnosis present

## 2022-09-14 DIAGNOSIS — J449 Chronic obstructive pulmonary disease, unspecified: Secondary | ICD-10-CM | POA: Diagnosis present

## 2022-09-14 DIAGNOSIS — E785 Hyperlipidemia, unspecified: Secondary | ICD-10-CM | POA: Diagnosis present

## 2022-09-14 DIAGNOSIS — E039 Hypothyroidism, unspecified: Secondary | ICD-10-CM | POA: Diagnosis present

## 2022-09-14 DIAGNOSIS — R9431 Abnormal electrocardiogram [ECG] [EKG]: Secondary | ICD-10-CM | POA: Diagnosis present

## 2022-09-14 DIAGNOSIS — I959 Hypotension, unspecified: Secondary | ICD-10-CM | POA: Diagnosis present

## 2022-09-14 DIAGNOSIS — G20A1 Parkinson's disease without dyskinesia, without mention of fluctuations: Secondary | ICD-10-CM | POA: Diagnosis present

## 2022-09-14 DIAGNOSIS — Z7901 Long term (current) use of anticoagulants: Secondary | ICD-10-CM

## 2022-09-14 DIAGNOSIS — F32A Depression, unspecified: Secondary | ICD-10-CM | POA: Diagnosis present

## 2022-09-14 DIAGNOSIS — E860 Dehydration: Secondary | ICD-10-CM | POA: Diagnosis present

## 2022-09-14 DIAGNOSIS — J189 Pneumonia, unspecified organism: Secondary | ICD-10-CM | POA: Diagnosis not present

## 2022-09-14 DIAGNOSIS — Z96643 Presence of artificial hip joint, bilateral: Secondary | ICD-10-CM | POA: Diagnosis present

## 2022-09-14 DIAGNOSIS — Z66 Do not resuscitate: Secondary | ICD-10-CM | POA: Diagnosis present

## 2022-09-14 DIAGNOSIS — Z981 Arthrodesis status: Secondary | ICD-10-CM

## 2022-09-14 DIAGNOSIS — I5032 Chronic diastolic (congestive) heart failure: Secondary | ICD-10-CM | POA: Diagnosis present

## 2022-09-14 DIAGNOSIS — Z1612 Extended spectrum beta lactamase (ESBL) resistance: Secondary | ICD-10-CM | POA: Diagnosis present

## 2022-09-14 DIAGNOSIS — M109 Gout, unspecified: Secondary | ICD-10-CM | POA: Diagnosis present

## 2022-09-14 DIAGNOSIS — Z978 Presence of other specified devices: Secondary | ICD-10-CM

## 2022-09-14 DIAGNOSIS — E119 Type 2 diabetes mellitus without complications: Secondary | ICD-10-CM | POA: Diagnosis present

## 2022-09-14 DIAGNOSIS — E872 Acidosis, unspecified: Secondary | ICD-10-CM | POA: Insufficient documentation

## 2022-09-14 DIAGNOSIS — Z79890 Hormone replacement therapy (postmenopausal): Secondary | ICD-10-CM

## 2022-09-14 DIAGNOSIS — B9729 Other coronavirus as the cause of diseases classified elsewhere: Secondary | ICD-10-CM | POA: Diagnosis present

## 2022-09-14 DIAGNOSIS — R531 Weakness: Secondary | ICD-10-CM | POA: Diagnosis present

## 2022-09-14 DIAGNOSIS — I340 Nonrheumatic mitral (valve) insufficiency: Secondary | ICD-10-CM | POA: Diagnosis present

## 2022-09-14 DIAGNOSIS — I10 Essential (primary) hypertension: Secondary | ICD-10-CM | POA: Diagnosis present

## 2022-09-14 DIAGNOSIS — Z993 Dependence on wheelchair: Secondary | ICD-10-CM

## 2022-09-14 DIAGNOSIS — Z9359 Other cystostomy status: Secondary | ICD-10-CM

## 2022-09-14 DIAGNOSIS — E669 Obesity, unspecified: Secondary | ICD-10-CM | POA: Diagnosis present

## 2022-09-14 DIAGNOSIS — J441 Chronic obstructive pulmonary disease with (acute) exacerbation: Secondary | ICD-10-CM | POA: Diagnosis present

## 2022-09-14 DIAGNOSIS — N319 Neuromuscular dysfunction of bladder, unspecified: Secondary | ICD-10-CM | POA: Diagnosis present

## 2022-09-14 DIAGNOSIS — N400 Enlarged prostate without lower urinary tract symptoms: Secondary | ICD-10-CM | POA: Diagnosis present

## 2022-09-14 DIAGNOSIS — A419 Sepsis, unspecified organism: Secondary | ICD-10-CM | POA: Diagnosis present

## 2022-09-14 DIAGNOSIS — F418 Other specified anxiety disorders: Secondary | ICD-10-CM | POA: Diagnosis not present

## 2022-09-14 DIAGNOSIS — Z6836 Body mass index (BMI) 36.0-36.9, adult: Secondary | ICD-10-CM

## 2022-09-14 DIAGNOSIS — Z79899 Other long term (current) drug therapy: Secondary | ICD-10-CM

## 2022-09-14 DIAGNOSIS — E038 Other specified hypothyroidism: Secondary | ICD-10-CM | POA: Diagnosis not present

## 2022-09-14 DIAGNOSIS — Z87891 Personal history of nicotine dependence: Secondary | ICD-10-CM

## 2022-09-14 DIAGNOSIS — Z7951 Long term (current) use of inhaled steroids: Secondary | ICD-10-CM

## 2022-09-14 DIAGNOSIS — G20B1 Parkinson's disease with dyskinesia, without mention of fluctuations: Secondary | ICD-10-CM | POA: Diagnosis not present

## 2022-09-14 DIAGNOSIS — I509 Heart failure, unspecified: Secondary | ICD-10-CM | POA: Diagnosis not present

## 2022-09-14 LAB — CBC
HCT: 36.6 % — ABNORMAL LOW (ref 39.0–52.0)
Hemoglobin: 11.6 g/dL — ABNORMAL LOW (ref 13.0–17.0)
MCH: 28.5 pg (ref 26.0–34.0)
MCHC: 31.7 g/dL (ref 30.0–36.0)
MCV: 89.9 fL (ref 80.0–100.0)
Platelets: 245 10*3/uL (ref 150–400)
RBC: 4.07 MIL/uL — ABNORMAL LOW (ref 4.22–5.81)
RDW: 14.6 % (ref 11.5–15.5)
WBC: 13.8 10*3/uL — ABNORMAL HIGH (ref 4.0–10.5)
nRBC: 0 % (ref 0.0–0.2)

## 2022-09-14 LAB — URINALYSIS, ROUTINE W REFLEX MICROSCOPIC
Bilirubin Urine: NEGATIVE
Glucose, UA: NEGATIVE mg/dL
Ketones, ur: NEGATIVE mg/dL
Nitrite: NEGATIVE
Protein, ur: 100 mg/dL — AB
Specific Gravity, Urine: 1.017 (ref 1.005–1.030)
Squamous Epithelial / HPF: NONE SEEN /HPF (ref 0–5)
WBC, UA: >50 WBC/hpf (ref 0–5)
pH: 8 (ref 5.0–8.0)

## 2022-09-14 LAB — CBG MONITORING, ED
Glucose-Capillary: 107 mg/dL — ABNORMAL HIGH (ref 70–99)
Glucose-Capillary: 114 mg/dL — ABNORMAL HIGH (ref 70–99)

## 2022-09-14 LAB — BASIC METABOLIC PANEL
Anion gap: 9 (ref 5–15)
BUN: 29 mg/dL — ABNORMAL HIGH (ref 8–23)
CO2: 21 mmol/L — ABNORMAL LOW (ref 22–32)
Calcium: 8.7 mg/dL — ABNORMAL LOW (ref 8.9–10.3)
Chloride: 105 mmol/L (ref 98–111)
Creatinine, Ser: 1.07 mg/dL (ref 0.61–1.24)
GFR, Estimated: >60 mL/min (ref >60)
Glucose, Bld: 116 mg/dL — ABNORMAL HIGH (ref 70–99)
Potassium: 3.7 mmol/L (ref 3.5–5.1)
Sodium: 135 mmol/L (ref 135–145)

## 2022-09-14 LAB — EXPECTORATED SPUTUM ASSESSMENT W GRAM STAIN, RFLX TO RESP C

## 2022-09-14 LAB — MAGNESIUM: Magnesium: 1.8 mg/dL (ref 1.7–2.4)

## 2022-09-14 LAB — LACTIC ACID, PLASMA
Lactic Acid, Venous: 1.1 mmol/L (ref 0.5–1.9)
Lactic Acid, Venous: 1.5 mmol/L (ref 0.5–1.9)

## 2022-09-14 LAB — GLUCOSE, CAPILLARY: Glucose-Capillary: 134 mg/dL — ABNORMAL HIGH (ref 70–99)

## 2022-09-14 LAB — STREP PNEUMONIAE URINARY ANTIGEN: Strep Pneumo Urinary Antigen: NEGATIVE

## 2022-09-14 LAB — PROCALCITONIN: Procalcitonin: <0.1 ng/mL

## 2022-09-14 MED ORDER — LACTATED RINGERS IV BOLUS
1000.0000 mL | Freq: Once | INTRAVENOUS | Status: AC
  Administered 2022-09-14: 1000 mL via INTRAVENOUS

## 2022-09-14 MED ORDER — OXYBUTYNIN CHLORIDE 5 MG PO TABS
5.0000 mg | ORAL_TABLET | Freq: Two times a day (BID) | ORAL | Status: DC
  Administered 2022-09-15 – 2022-09-17 (×5): 5 mg via ORAL
  Filled 2022-09-14 (×5): qty 1

## 2022-09-14 MED ORDER — METHOCARBAMOL 500 MG PO TABS
1000.0000 mg | ORAL_TABLET | Freq: Two times a day (BID) | ORAL | Status: DC
  Administered 2022-09-14 – 2022-09-17 (×6): 1000 mg via ORAL
  Filled 2022-09-14 (×7): qty 2

## 2022-09-14 MED ORDER — FLUTICASONE FUROATE-VILANTEROL 100-25 MCG/ACT IN AEPB
1.0000 | INHALATION_SPRAY | Freq: Every day | RESPIRATORY_TRACT | Status: DC
  Filled 2022-09-14: qty 28

## 2022-09-14 MED ORDER — APIXABAN 5 MG PO TABS
5.0000 mg | ORAL_TABLET | Freq: Two times a day (BID) | ORAL | Status: DC
  Administered 2022-09-14 – 2022-09-17 (×6): 5 mg via ORAL
  Filled 2022-09-14 (×7): qty 1

## 2022-09-14 MED ORDER — ONDANSETRON HCL 4 MG/2ML IJ SOLN: 4.0000 mg | Freq: Three times a day (TID) | INTRAMUSCULAR | Status: DC | PRN

## 2022-09-14 MED ORDER — SILVER SULFADIAZINE 1 % EX CREA
1.0000 | TOPICAL_CREAM | Freq: Every day | CUTANEOUS | Status: DC
  Administered 2022-09-14 – 2022-09-17 (×4): 1 via TOPICAL
  Filled 2022-09-14: qty 85

## 2022-09-14 MED ORDER — LACTATED RINGERS IV BOLUS (SEPSIS)
1000.0000 mL | Freq: Once | INTRAVENOUS | Status: AC
  Administered 2022-09-14: 1000 mL via INTRAVENOUS

## 2022-09-14 MED ORDER — VITAMIN D3 25 MCG (1000 UNIT) PO TABS
1000.0000 [IU] | ORAL_TABLET | Freq: Every day | ORAL | Status: DC
  Administered 2022-09-14 – 2022-09-17 (×4): 1000 [IU] via ORAL
  Filled 2022-09-14 (×8): qty 1

## 2022-09-14 MED ORDER — TACROLIMUS 0.03 % EX OINT
1.0000 "application " | TOPICAL_OINTMENT | Freq: Every day | CUTANEOUS | Status: DC
  Administered 2022-09-15: 1 via TOPICAL

## 2022-09-14 MED ORDER — DM-GUAIFENESIN ER 30-600 MG PO TB12
1.0000 | ORAL_TABLET | Freq: Two times a day (BID) | ORAL | Status: DC | PRN
  Administered 2022-09-14 – 2022-09-17 (×3): 1 via ORAL
  Filled 2022-09-14 (×3): qty 1

## 2022-09-14 MED ORDER — LACTATED RINGERS IV BOLUS: 1500.0000 mL | Freq: Once | INTRAVENOUS | Status: DC

## 2022-09-14 MED ORDER — SODIUM CHLORIDE 0.9 % IV SOLN: 500.0000 mg | Freq: Once | INTRAVENOUS | Status: DC

## 2022-09-14 MED ORDER — PANTOPRAZOLE SODIUM 40 MG PO TBEC
40.0000 mg | DELAYED_RELEASE_TABLET | Freq: Every day | ORAL | Status: DC
  Administered 2022-09-15 – 2022-09-17 (×3): 40 mg via ORAL
  Filled 2022-09-14 (×3): qty 1

## 2022-09-14 MED ORDER — GABAPENTIN 400 MG PO CAPS
800.0000 mg | ORAL_CAPSULE | Freq: Two times a day (BID) | ORAL | Status: DC
  Administered 2022-09-14 – 2022-09-17 (×6): 800 mg via ORAL
  Filled 2022-09-14 (×7): qty 2

## 2022-09-14 MED ORDER — IOHEXOL 300 MG/ML  SOLN
100.0000 mL | Freq: Once | INTRAMUSCULAR | Status: AC | PRN
  Administered 2022-09-14: 100 mL via INTRAVENOUS

## 2022-09-14 MED ORDER — TERBINAFINE HCL 1 % EX CREA
1.0000 | TOPICAL_CREAM | Freq: Every day | CUTANEOUS | Status: DC
  Administered 2022-09-14 – 2022-09-16 (×3): 1 via TOPICAL
  Filled 2022-09-14 (×2): qty 12

## 2022-09-14 MED ORDER — FLUTICASONE PROPIONATE 50 MCG/ACT NA SUSP
2.0000 | Freq: Every day | NASAL | Status: DC
  Administered 2022-09-15 – 2022-09-17 (×3): 2 via NASAL
  Filled 2022-09-14: qty 16

## 2022-09-14 MED ORDER — SODIUM CHLORIDE 0.9 % IV SOLN
100.0000 mg | Freq: Two times a day (BID) | INTRAVENOUS | Status: DC
  Administered 2022-09-14 – 2022-09-15 (×2): 100 mg via INTRAVENOUS
  Filled 2022-09-14 (×3): qty 100

## 2022-09-14 MED ORDER — ACETAMINOPHEN 325 MG PO TABS: 650.0000 mg | ORAL_TABLET | Freq: Four times a day (QID) | ORAL | Status: DC | PRN

## 2022-09-14 MED ORDER — CARBIDOPA-LEVODOPA 25-100 MG PO TABS
1.0000 | ORAL_TABLET | Freq: Three times a day (TID) | ORAL | Status: DC
  Administered 2022-09-14 – 2022-09-17 (×8): 1 via ORAL
  Filled 2022-09-14 (×8): qty 1

## 2022-09-14 MED ORDER — TRIAMCINOLONE ACETONIDE 0.5 % EX CREA
TOPICAL_CREAM | Freq: Two times a day (BID) | CUTANEOUS | Status: DC | PRN
  Filled 2022-09-14: qty 15

## 2022-09-14 MED ORDER — LACTATED RINGERS IV SOLN: INTRAVENOUS | Status: DC

## 2022-09-14 MED ORDER — METHYLPREDNISOLONE SODIUM SUCC 40 MG IJ SOLR
40.0000 mg | Freq: Two times a day (BID) | INTRAMUSCULAR | Status: DC
  Administered 2022-09-14 – 2022-09-15 (×2): 40 mg via INTRAVENOUS
  Filled 2022-09-14 (×2): qty 1

## 2022-09-14 MED ORDER — ALBUTEROL SULFATE (2.5 MG/3ML) 0.083% IN NEBU
3.0000 mL | INHALATION_SOLUTION | RESPIRATORY_TRACT | Status: DC | PRN
  Administered 2022-09-16: 3 mL via RESPIRATORY_TRACT

## 2022-09-14 MED ORDER — DIPHENHYDRAMINE HCL 50 MG/ML IJ SOLN
12.5000 mg | Freq: Three times a day (TID) | INTRAMUSCULAR | Status: DC | PRN
  Administered 2022-09-14: 12.5 mg via INTRAVENOUS
  Filled 2022-09-14: qty 1

## 2022-09-14 MED ORDER — DOXYCYCLINE HYCLATE 100 MG IV SOLR: 200.0000 mg | Freq: Two times a day (BID) | INTRAVENOUS | Status: DC

## 2022-09-14 MED ORDER — ALLOPURINOL 100 MG PO TABS
100.0000 mg | ORAL_TABLET | Freq: Every day | ORAL | Status: DC
  Administered 2022-09-15 – 2022-09-17 (×3): 100 mg via ORAL
  Filled 2022-09-14 (×3): qty 1

## 2022-09-14 MED ORDER — HYDRALAZINE HCL 20 MG/ML IJ SOLN: 5.0000 mg | INTRAMUSCULAR | Status: DC | PRN

## 2022-09-14 MED ORDER — IPRATROPIUM-ALBUTEROL 0.5-2.5 (3) MG/3ML IN SOLN
3.0000 mL | RESPIRATORY_TRACT | Status: DC
  Administered 2022-09-14 – 2022-09-15 (×2): 3 mL via RESPIRATORY_TRACT
  Filled 2022-09-14 (×2): qty 3

## 2022-09-14 MED ORDER — LEVOTHYROXINE SODIUM 50 MCG PO TABS
75.0000 ug | ORAL_TABLET | Freq: Every day | ORAL | Status: DC
  Administered 2022-09-15 – 2022-09-17 (×3): 75 ug via ORAL
  Filled 2022-09-14 (×3): qty 2

## 2022-09-14 MED ORDER — UMECLIDINIUM BROMIDE 62.5 MCG/ACT IN AEPB
1.0000 | INHALATION_SPRAY | Freq: Every day | RESPIRATORY_TRACT | Status: DC
  Administered 2022-09-15 – 2022-09-17 (×3): 1 via RESPIRATORY_TRACT
  Filled 2022-09-14: qty 7

## 2022-09-14 MED ORDER — MELATONIN 5 MG PO TABS
10.0000 mg | ORAL_TABLET | Freq: Every day | ORAL | Status: DC
  Administered 2022-09-14 – 2022-09-16 (×3): 10 mg via ORAL
  Filled 2022-09-14 (×3): qty 2

## 2022-09-14 MED ORDER — ATORVASTATIN CALCIUM 20 MG PO TABS
20.0000 mg | ORAL_TABLET | Freq: Every day | ORAL | Status: DC
  Administered 2022-09-14 – 2022-09-16 (×3): 20 mg via ORAL
  Filled 2022-09-14 (×3): qty 1

## 2022-09-14 MED ORDER — POLYETHYLENE GLYCOL 3350 17 G PO PACK: 17.0000 g | PACK | Freq: Every day | ORAL | Status: DC | PRN

## 2022-09-14 MED ORDER — HYDROCORTISONE 0.5 % EX CREA
TOPICAL_CREAM | Freq: Two times a day (BID) | CUTANEOUS | Status: DC
  Filled 2022-09-14: qty 28.35

## 2022-09-14 MED ORDER — SODIUM CHLORIDE 0.9 % IV SOLN
1.0000 g | Freq: Three times a day (TID) | INTRAVENOUS | Status: DC
  Administered 2022-09-14 – 2022-09-17 (×9): 1 g via INTRAVENOUS
  Filled 2022-09-14 (×10): qty 20

## 2022-09-14 MED ORDER — SODIUM CHLORIDE 0.9 % IV SOLN
2.0000 g | Freq: Once | INTRAVENOUS | Status: AC
  Administered 2022-09-14: 2 g via INTRAVENOUS
  Filled 2022-09-14: qty 12.5

## 2022-09-14 NOTE — Sepsis Progress Note (Signed)
Elink following code sepsis °

## 2022-09-14 NOTE — H&P (Signed)
History and Physical    Dontreal Taute ZOX:[id number] DOB: Jun 29, 1945 DOA: 09/14/2022  Referring MD/NP/PA:   PCP: [facility]   Patient coming from:  The patient is coming from SNF   Chief Complaint: weakness, cough  HPI: Raymond Hays is a 77 y.o. male with medical history significant of chronic indwelling Foley catheter placement, neurogenic bladder, A-fib on Eliquis, ESBL UTI, spinal stenosis, wheelchair bound, Parkinson's disease, hypertension, diet controled DM, COPD, hypothyroidism, gout, depression with anxiety, BPH, anemia, who presents with generalized weakness.  Patient states that he does not feel well since this morning, with generalized weakness.  He feels like he is going to pass out, but did not. Patient has shortness of breath and productive cough, denies chest pain.  No fever or chills.  Denies nausea, vomiting, diarrhea or abdominal pain.  No symptoms of UTI.  Patient has a chronic indwelling Foley catheter, not sure when the catheter was changed.   Patient was initially hypotensive with blood pressure 75/45 --> 85/64, which improved to 106/64 after giving 1L of LR bolus in ED.  Data reviewed independently and ED Course: pt was found to have WBC 13.8, positive urinalysis (cloudy appearance, large amount of leukocyte, many bacteria, WBC> 50), temperature normal, heart rate 84, RR 24, oxygen saturation 94% on room air.  Patient is admitted to telemetry bed as inpatient.  CT abdomen/pelvis:   IMPRESSION: 1. Moderate to large volume of stool in the colon and rectum suggesting constipation. No other definite acute findings confidently identified in the abdomen or pelvis. 2. Atelectasis and consolidation in the right lower lobe concerning for pneumonia. 3. Cholelithiasis without evidence of acute cholecystitis. 4. Cardiomegaly. 5. Aortic atherosclerosis, in addition to left main and three-vessel coronary artery disease. Assessment for potential risk factor modification,  dietary therapy or pharmacologic therapy may be warranted, if clinically indicated. 6. Additional incidental findings, as above.      EKG: I have personally reviewed.  Atrial fibrillation, QTc 658, early R wave progression   Review of Systems:   General: no fevers, chills, no body weight gain, has fatigue HEENT: no blurry vision, hearing changes or sore throat Respiratory: has dyspnea, coughing, wheezing CV: no chest pain, no palpitations GI: no nausea, vomiting, abdominal pain, diarrhea, constipation GU: no dysuria, burning on urination, increased urinary frequency, hematuria  Ext: no leg edema Neuro: No vision change or hearing loss Skin: no rash, no skin tear. MSK: No muscle spasm, no deformity, no limitation of range of movement in spin Heme: No easy bruising.  Travel history: No recent long distant travel.   Allergy: No Known Allergies  Past Medical History:  Diagnosis Date   Allergic rhinitis    Anxiety    Arthritis    Atrial fibrillation (HCC)    Chronic indwelling Foley catheter    COPD (chronic obstructive pulmonary disease) (HCC)    Cough    Diabetes (HCC)    Essential (primary) hypertension    Hemorrhoid    Hyperlipidemia    PAF (paroxysmal atrial fibrillation) (HCC)    Primary osteoarthritis, unspecified shoulder    Retention of urine, unspecified    Spinal stenosis    UTI (urinary tract infection)     Past Surgical History:  Procedure Laterality Date   APPENDECTOMY     BACK SURGERY     COLONOSCOPY N/A 10/27/2020   Procedure: COLONOSCOPY;  Surgeon: Wyline Mood, MD;  Location: [facility] ENDOSCOPY;  Service: Gastroenterology;  Laterality: N/A;   ELBOW SURGERY     ESOPHAGOGASTRODUODENOSCOPY (  EGD) WITH PROPOFOL N/A 10/27/2020   Procedure: ESOPHAGOGASTRODUODENOSCOPY (EGD) WITH PROPOFOL;  Surgeon: Wyline Mood, MD;  Location: [facility] ENDOSCOPY;  Service: Gastroenterology;  Laterality: N/A;   HERNIA REPAIR     KNEE SURGERY     neck     PARTIAL HIP ARTHROPLASTY  Bilateral    TEE WITHOUT CARDIOVERSION N/A 10/19/2020   Procedure: TRANSESOPHAGEAL ECHOCARDIOGRAM (TEE);  Surgeon: Antonieta Iba, MD;  Location: [facility] ORS;  Service: Cardiovascular;  Laterality: N/A;   TONSILLECTOMY      Social History:  reports that he quit smoking about 18 years ago. His smoking use included cigarettes. He has a 5.00 pack-year smoking history. He has never used smokeless tobacco. He reports that he does not currently use alcohol. He reports that he does not currently use drugs after having used the following drugs: "Crack" cocaine.  Family History:  Family History  Problem Relation Age of Onset   Healthy Son    Healthy Son      Prior to Admission medications   Medication Sig Start Date End Date Taking? Authorizing Provider  acetaminophen (TYLENOL) 325 MG tablet Take 650 mg by mouth every 4 (four) hours as needed for mild pain.   Yes [provider]  acetic acid 0.25 % irrigation Irrigate with 1 Application as directed every 12 (twelve) hours. (Flush foley catheter)   Yes [provider]  albuterol (PROVENTIL) (2.5 MG/3ML) 0.083% nebulizer solution Take 2.5 mg by nebulization every 8 (eight) hours as needed for wheezing or shortness of breath.   Yes [provider]  Albuterol Sulfate (PROAIR RESPICLICK) 108 (90 Base) MCG/ACT AEPB Inhale 2 puffs into the lungs every 4 (four) hours as needed (COPD).   Yes [provider]  allopurinol (ZYLOPRIM) 100 MG tablet Take 100 mg by mouth daily.   Yes [provider]  apixaban (ELIQUIS) 5 MG TABS tablet Take 5 mg by mouth 2 (two) times daily.    Yes [provider]  atorvastatin (LIPITOR) 20 MG tablet Take 20 mg by mouth at bedtime.   Yes [provider]  Calcium Carbonate (CALCIUM 500 PO) Take 2 tablets by mouth every 6 (six) hours as needed (indigestion/heartburn).   Yes [provider]  carbidopa-levodopa (SINEMET IR) 25-100 MG tablet Take 1 tablet by mouth 3  (three) times daily before meals.   Yes [provider]  cetirizine (ZYRTEC) 5 MG tablet Take 5 mg by mouth daily.   Yes [provider]  Cholecalciferol (VITAMIN D-3) 25 MCG (1000 UT) CAPS Take 1,000 Units by mouth daily.   Yes [provider]  citalopram (CELEXA) 10 MG tablet Take 10 mg by mouth daily.   Yes [provider]  Dextromethorphan-guaiFENesin (COUGH & CHEST CONGESTION DM) 5-50 MG/5ML SYRP Take 10 mLs by mouth every 4 (four) hours as needed (cough).   Yes [provider]  dupilumab (DUPIXENT) 300 MG/2ML prefilled syringe Inject 300 mg into the skin every 14 (fourteen) days.   Yes [provider]  Emollient (CETAPHIL) cream Apply 1 Application topically 2 (two) times daily as needed.   Yes [provider]  Eyelid Cleansers (OCUSOFT LID SCRUB ORIGINAL) PADS Place 1 application  into both eyes daily.   Yes [provider]  Eyelid Cleansers (OCUSOFT LID SCRUB ORIGINAL) PADS Place 1 Pad into both eyes every 12 (twelve) hours as needed (eyelid cleansing).   Yes [provider]  famotidine (PEPCID) 20 MG tablet Take 20 mg by mouth at bedtime.   Yes  [provider]  fluticasone (FLONASE) 50 MCG/ACT nasal spray Place 2 sprays into both nostrils daily.   Yes [provider]  Fluticasone-Umeclidin-Vilant 100-62.5-25 MCG/ACT AEPB Inhale 1 puff into the lungs daily.   Yes [provider]  gabapentin (NEURONTIN) 800 MG tablet Take 800 mg by mouth 2 (two) times daily.    Yes [provider]  hydrocortisone (ANUSOL-HC) 25 MG suppository Place 25 mg rectally every 4 (four) hours as needed for hemorrhoids or anal itching.   Yes [provider]  hydrOXYzine (ATARAX/VISTARIL) 25 MG tablet Take 25 mg by mouth every 6 (six) hours as needed for itching. 02/17/20  Yes [provider]  lactase (LACTAID) 3000 units tablet Take 3,000 Units by mouth 3 (three) times daily with meals.    Yes [provider]  Lactobacillus (ACIDOPHILUS) CAPS capsule Take 1 capsule by mouth daily.   Yes [provider]  levothyroxine (SYNTHROID) 75 MCG tablet Take 75 mcg by mouth at bedtime.   Yes [provider]  Lido-PE-Glycerin-Petrolatum (PREPARATION H RAPID RELIEF RE) Place 1 suppository rectally every 4 (four) hours as needed (hemorrhoids).   Yes [provider]  lidocaine 4 % Place 1 patch onto the skin daily as needed (pain). (Remove after 12 hours)   Yes [provider]  loperamide (IMODIUM) 2 MG capsule Take 2 mg by mouth every 4 (four) hours as needed for diarrhea or loose stools. (Max 16mg  daily)   Yes [provider]  melatonin 5 MG TABS Take 10 mg by mouth at bedtime.   Yes [provider]  Menthol, Topical Analgesic, (BIOFREEZE) 4 % GEL Apply 1 application  topically 3 (three) times daily. (Apply to neck)   Yes [provider]  methocarbamol (ROBAXIN) 500 MG tablet Take 1,000 mg by mouth 2 (two) times daily.   Yes [provider]  methocarbamol (ROBAXIN) 500 MG tablet Take 500 mg by mouth daily at 2 PM.   Yes [provider]  oxybutynin (DITROPAN) 5 MG tablet Take 5 mg by mouth 2 (two) times daily.   Yes [provider]  pantoprazole (PROTONIX) 40 MG tablet Take 1 tablet (40 mg total) by mouth 2 (two) times daily before a meal. For 8 weeks. Patient taking differently: Take 40 mg by mouth daily. 11/01/20  Yes [name], MD  polyethylene glycol (MIRALAX / GLYCOLAX) 17 g packet Take 17 g by mouth daily as needed for moderate constipation.   Yes [provider]  Propylene Glycol 0.6 % SOLN Place 2 drops into both eyes in the morning, at noon, and at bedtime.   Yes [provider]  selenium sulfide (SELSUN) 1 % LOTN Apply 1 Application topically 2 (two) times a week. (Tuesday and Friday)   Yes [provider]  silver sulfADIAZINE (SILVADENE) 1 % cream Apply 1 Application  topically daily. (Apply to arms and face)   Yes [provider]  sodium chloride (OCEAN) 0.65 % SOLN nasal spray Place 2 sprays into both nostrils every 6 (six) hours as needed for congestion.   Yes [provider]  tacrolimus (PROTOPIC) 0.03 % ointment Apply 1 application  topically daily. (Apply to eyelids)   Yes [provider]  terbinafine (LAMISIL) 1 % cream Apply 1 Application topically at bedtime. (Apply to feet)   Yes [provider]  tiZANidine (ZANAFLEX) 2 MG tablet Take 2 mg by mouth 2 (two) times daily.   Yes [provider]  triamcinolone ointment (KENALOG) 0.1 % 1  Application daily as needed (skin topical rash). (Apply to arms, legs and back)   Yes [provider]    Physical Exam: Vitals:   09/14/22 1424 09/14/22 1500 09/14/22 1530 09/14/22 1630  BP:  (!) 142/77 129/82 131/84  Pulse:   98 91  Resp:  16 (!) 26 (!) 24  Temp: 97.6 F (36.4 C)     TempSrc: Oral     SpO2:      Weight:      Height:       General: Not in acute distress HEENT:       Eyes: PERRL, EOMI, no jaundice       ENT: No discharge from the ears and nose, no pharynx injection, no tonsillar enlargement.        Neck: No JVD, no bruit, no mass felt. Heme: No neck lymph node enlargement. Cardiac: S1/S2, RRR, No murmurs, No gallops or rubs. Respiratory: has coarse breathing sounds, diffuse rhonchi and mild wheezing GI: Soft, distended, nontender, no rebound pain, no organomegaly, BS present. GU: No hematuria.  Indwelling Foley catheter in place Ext: No pitting leg edema bilaterally. 1+DP/PT pulse bilaterally. Musculoskeletal: No joint deformities, No joint redness or warmth, no limitation of ROM in spin. Skin: No rashes.  Neuro: Alert, oriented X3, cranial nerves II-XII grossly intact, has chronic leg weakness and wheelchair-bound Psych: Patient is not psychotic, no suicidal or hemocidal ideation.  Labs on Admission: I have personally reviewed following  labs and imaging studies  CBC: Recent Labs  Lab 09/14/22 1137  WBC 13.8*  HGB 11.6*  HCT 36.6*  MCV 89.9  PLT 245   Basic Metabolic Panel: Recent Labs  Lab 09/14/22 1137  NA 135  K 3.7  CL 105  CO2 21*  GLUCOSE 116*  BUN 29*  CREATININE 1.07  CALCIUM 8.7*  MG 1.8   GFR: Estimated Creatinine Clearance: 65.8 mL/min (by C-G formula based on SCr of 1.07 mg/dL). Liver Function Tests: No results for input(s): "AST", "ALT", "ALKPHOS", "BILITOT", "PROT", "ALBUMIN" in the last 168 hours. No results for input(s): "LIPASE", "AMYLASE" in the last 168 hours. No results for input(s): "AMMONIA" in the last 168 hours. Coagulation Profile: No results for input(s): "INR", "PROTIME" in the last 168 hours. Cardiac Enzymes: No results for input(s): "CKTOTAL", "CKMB", "CKMBINDEX", "TROPONINI" in the last 168 hours. BNP (last 3 results) No results for input(s): "PROBNP" in the last 8760 hours. HbA1C: No results for input(s): "HGBA1C" in the last 72 hours. CBG: Recent Labs  Lab 09/14/22 1225  GLUCAP 107*   Lipid Profile: No results for input(s): "CHOL", "HDL", "LDLCALC", "TRIG", "CHOLHDL", "LDLDIRECT" in the last 72 hours. Thyroid Function Tests: No results for input(s): "TSH", "T4TOTAL", "FREET4", "T3FREE", "THYROIDAB" in the last 72 hours. Anemia Panel: No results for input(s): "VITAMINB12", "FOLATE", "FERRITIN", "TIBC", "IRON", "RETICCTPCT" in the last 72 hours. Urine analysis:    Component Value Date/Time   COLORURINE YELLOW (A) 09/14/2022 1228   APPEARANCEUR CLOUDY (A) 09/14/2022 1228   LABSPEC 1.017 09/14/2022 1228   PHURINE 8.0 09/14/2022 1228   GLUCOSEU NEGATIVE 09/14/2022 1228   HGBUR SMALL (A) 09/14/2022 1228   BILIRUBINUR NEGATIVE 09/14/2022 1228   KETONESUR NEGATIVE 09/14/2022 1228   PROTEINUR 100 (A) 09/14/2022 1228   NITRITE NEGATIVE 09/14/2022 1228   LEUKOCYTESUR LARGE (A) 09/14/2022 1228   Sepsis Labs: @LABRCNTIP (procalcitonin:4,lacticidven:4) )No results  found for this or any previous visit (from the past 240 hour(s)).   Radiological Exams on Admission: CT ABDOMEN PELVIS W CONTRAST  Result  Date: 09/14/2022 CLINICAL DATA:  77 year old male with history of acute onset of nonlocalized abdominal pain. EXAM: CT ABDOMEN AND PELVIS WITH CONTRAST TECHNIQUE: Multidetector CT imaging of the abdomen and pelvis was performed using the standard protocol following bolus administration of intravenous contrast. RADIATION DOSE REDUCTION: This exam was performed according to the departmental dose-optimization program which includes automated exposure control, adjustment of the mA and/or kV according to patient size and/or use of iterative reconstruction technique. CONTRAST:  OMNIPAQUE IOHEXOL 300 MG/ML  SOLN COMPARISON:  CT of the abdomen and pelvis 04/16/2022. FINDINGS: Lower chest: There appears to be a combination of atelectasis and consolidation in the right lower lobe. Elevation of the right hemidiaphragm. Atherosclerotic calcifications are noted in the left main, left anterior descending, left circumflex and right coronary arteries. Calcifications of the mitral annulus. MitraClip incidentally noted. Cardiomegaly with biatrial dilatation. Hepatobiliary: 2.1 x 1.5 cm low-attenuation lesion in segment 7 of the liver, similar to prior studies, compatible with a simple cyst (no imaging follow-up recommended). No other new suspicious appearing hepatic lesions are noted. Gallbladder is moderately distended. Noncalcified gallstone lying dependently in the gallbladder measuring 5 mm (axial image 31 of series 2). Gallbladder wall does not appear thickened. No pericholecystic fluid or surrounding inflammatory changes. Pancreas: No pancreatic mass. No pancreatic ductal dilatation. No pancreatic or peripancreatic fluid collections or inflammatory changes. Spleen: Unremarkable. Adrenals/Urinary Tract: Multiple low-attenuation lesions in both kidneys, many of which are too small to  definitively characterize, but are statistically likely to represent small cysts (no imaging follow-up recommended). The largest of these lesions is an exophytic simple cyst (Bosniak class 1, no imaging follow-up recommended) measuring 2.6 cm in the posterior aspect of the interpolar region of the left kidney. No hydroureteronephrosis. Urinary bladder is nearly completely decompressed with an indwelling suprapubic catheter in place. Bilateral adrenal glands are normal in appearance. Stomach/Bowel: The appearance of the stomach is normal. No pathologic dilatation of small bowel or colon. Large volume of well-formed stool again noted in the rectal vault. Gaseous distension of the sigmoid colon proximal to this is noted, but remaining portions of the colon are otherwise relatively normal in caliber, although there is a relatively large volume of well-formed stool in the colon. The appendix is not confidently identified and may be surgically absent. Regardless, there are no inflammatory changes noted adjacent to the cecum to suggest the presence of an acute appendicitis at this time. Vascular/Lymphatic: Aortic atherosclerosis, without evidence of aneurysm or dissection in the abdominal or pelvic vasculature. No lymphadenopathy noted in the abdomen or pelvis. Reproductive: Prostate gland and seminal vesicles are obscured by beam hardening artifact from the patient's bilateral hip arthroplasties. Other: No significant volume of ascites.  No pneumoperitoneum. Musculoskeletal: Status post bilateral hip arthroplasty. There are no aggressive appearing lytic or blastic lesions noted in the visualized portions of the skeleton. IMPRESSION: 1. Moderate to large volume of stool in the colon and rectum suggesting constipation. No other definite acute findings confidently identified in the abdomen or pelvis. 2. Atelectasis and consolidation in the right lower lobe concerning for pneumonia. 3. Cholelithiasis without evidence of acute  cholecystitis. 4. Cardiomegaly. 5. Aortic atherosclerosis, in addition to left main and three-vessel coronary artery disease. Assessment for potential risk factor modification, dietary therapy or pharmacologic therapy may be warranted, if clinically indicated. 6. Additional incidental findings, as above. Electronically Signed   By: Trudie Reed M.D.   On: 09/14/2022 13:51      Assessment/Plan Principal Problem:   Complicated UTI (urinary  tract infection) Active Problems:   Chronic indwelling Foley catheter   COPD exacerbation (HCC)   CAP (community acquired pneumonia)   Sepsis (HCC)   AF (paroxysmal atrial fibrillation) (HCC)   Essential hypertension   Parkinson disease   Hypothyroidism   Prolonged QT interval   Depression with anxiety   Obesity (BMI 30-39.9)   Assessment and Plan:   Complicated UTI (urinary tract infection) and sepsis: Patient has history of ESBL, now has recurrent complicated UTI. Pt meets criteria for sepsis with WBC 13.8, RR 24.  Initially hypotensive which responded to IV fluid resuscitation.  Lactic acid normal 1.1.  Currently hemodynamically stable.  -Admitted to telemetry bed as inpatient -IV meropenem (patient received 1 dose of cefepime in ED) -Follow-up blood culture and urine culture -IV fluid: 2 L LR, then 100 cc/h -Check procalcitonin level  Chronic indwelling Foley catheter: -I called ICU RN, [place] for helping to change the Foley -Continue oxybutynin  COPD exacerbation (HCC) and possible CAP (community acquired pneumonia): Patient has wheezing and diffuse rhonchi on auscultation, indicating COPD exacerbation.  CT of abdomen/pelvis showed infiltration in right lower lobe, indicating possible pneumonia.  - Abx: pt is on meropenem, will add doxycycline - Incentive spirometry - Solu-Medrol 40 mg twice daily - Mucinex for cough  - Bronchodilators - Urine legionella and S. pneumococcal antigen - Follow up blood culture x2, sputum  culture  Sepsis (HCC): due to complicated UTI, possible pneumonia and COPD exacerbation.  Lactic acid is 1.1. -IVF as above -on Abx as above -f/u procalcitonin level  AF (paroxysmal atrial fibrillation) (HCC): HR 80s. -Eliquis  Essential hypertension: Initially hypotensive, which improved to 106/64 with IV fluid.  Patient is not taking blood pressure medications currently. -Monitor blood pressure closely -IV hydralazine.  Parkinson disease -Sinemet -Fall precaution  Hypothyroidism -Synthroid  Prolonged QT interval: QTc 658 -Avoid QTc prolonging medications, such as Zofran.  Use as needed Benadryl for nausea and vomiting -Hold Celexa, Pepcid, Imodium, tizanidine -check Mg level  Depression with anxiety -Hold Celexa due to QTc prolonged patient  Obesity (BMI 30-39.9): Body weight well 5.2 kg, BMI 36.32 -Healthy diet and exercise -Encourage losing weight      DVT ppx: on Eliquis  Code Status: DNR ( pt has DNR form from SNF)  Family Communication:  Yes, patient's son by phone  Disposition Plan:  Anticipate discharge back to previous environment, SNF  Consults called:  none  Admission status and Level of care: Telemetry Medical:    as inpt      Dispo: The patient is from: SNF              Anticipated d/c is to: SNF              Anticipated d/c date is: 2 days              Patient currently is not medically stable to d/c.    Severity of Illness:  The appropriate patient status for this patient is INPATIENT. Inpatient status is judged to be reasonable and necessary in order to provide the required intensity of service to ensure the patient's safety. The patient's presenting symptoms, physical exam findings, and initial radiographic and laboratory data in the context of their chronic comorbidities is felt to place them at high risk for further clinical deterioration. Furthermore, it is not anticipated that the patient will be medically stable for discharge from the  hospital within 2 midnights of admission.   * I certify that at the point  of admission it is my clinical judgment that the patient will require inpatient hospital care spanning beyond 2 midnights from the point of admission due to high intensity of service, high risk for further deterioration and high frequency of surveillance required.*       Date of Service 09/14/2022    Lorretta Harp [facility]   If 7PM-7AM, please contact night-coverage [email or web address] 09/14/2022, 4:47 PM

## 2022-09-14 NOTE — Consult Note (Signed)
Pharmacy Antibiotic Note  Raymond Hays is a 77 y.o. male with past medical history including Afib, chronic pain, COPD, HTN, COPD, DM, depression / anxiety, hypothyroidism, BPH with LUTS / chronic foley admitted on 09/14/2022 with UTI.  Pharmacy has been consulted for meropenem dosing.  Plan:  Meropenem 1 g IV q8h  Height: 5\' 7"  (170.2 cm) Weight: 105.2 kg (231 lb 14.8 oz) (stretcher scale) IBW/kg (Calculated) : 66.1  Temp (24hrs), Avg:98.1 F (36.7 C), Min:97.6 F (36.4 C), Max:98.3 F (36.8 C)  Recent Labs  Lab 09/14/22 1137  WBC 13.8*  CREATININE 1.07  LATICACIDVEN 1.1    Estimated Creatinine Clearance: 65.8 mL/min (by C-G formula based on SCr of 1.07 mg/dL).    No Known Allergies  Antimicrobials this admission: Cefepime 6/15 x 1 Meropenem 6/15 >>   Dose adjustments this admission: N/A  Microbiology results: 6/15 BCx: pending 6/15 UCx: pending   Thank you for allowing pharmacy to be a part of this patient's care.  Tressie Ellis 09/14/2022 2:51 PM

## 2022-09-14 NOTE — Consult Note (Signed)
CODE SEPSIS - PHARMACY COMMUNICATION  **Broad-spectrum antimicrobials should be administered within one hour of sepsis diagnosis**  Time Code Sepsis call or page was received: 1231  Antibiotics ordered: Cefepime  Time of first antibiotic administration: 1310  Additional action taken by pharmacy: N/A  If necessary, name of provider/nurse contacted: N/A    Will M. Dareen Piano, PharmD PGY-1 Pharmacy Resident 09/14/2022 12:58 PM

## 2022-09-14 NOTE — ED Triage Notes (Signed)
Pt complains of sudden onset weakness this morning and felt like he was going to pass out. Pt has chronic foley and states it hasn't been changed in "a hot minute" pt state he feels better right now. Per EMS BP was 75/45 at and then went up to 110/60 enroute  CBG 110. Denies any pain at this time.

## 2022-09-14 NOTE — ED Provider Notes (Signed)
[facility] Provider Note    Event Date/Time   First MD Initiated Contact with Patient 09/14/22 1212     (approximate)   History   Weakness   HPI  Raymond Hays is a 77 y.o. male past medical history significant for Parkinson's disease, paroxysmal atrial fibrillation, chronic indwelling suprapubic catheter due to neurogenic bladder, COPD, hypertension, who presents to the emergency department for not feeling well.  States that whenever he got this morning felt like he was going to pass out.  States that he felt fine last night prior to going to sleep.  When EMS arrived patient's blood pressure was low at 75/45 and then improved to 110/60.  Uncertain of last time his suprapubic catheter was exchanged.  Denies any falls or head injury.     Physical Exam   Triage Vital Signs: ED Triage Vitals  Enc Vitals Group     BP 09/14/22 1125 (!) 89/62     Pulse Rate 09/14/22 1125 84     Resp 09/14/22 1125 18     Temp 09/14/22 1125 98.3 F (36.8 C)     Temp Source 09/14/22 1125 Oral     SpO2 09/14/22 1125 96 %     Weight 09/14/22 1126 214 lb 15.2 oz (97.5 kg)     Height 09/14/22 1126 5\' 7"  (1.702 m)     Head Circumference --      Peak Flow --      Pain Score 09/14/22 1126 0     Pain Loc --      Pain Edu? --      Excl. in GC? --     Most recent vital signs: Vitals:   09/14/22 1245 09/14/22 1300  BP: (!) 86/60 (!) 97/56  Pulse: 80 84  Resp: (!) 24 (!) 22  Temp:    SpO2: 94%     Physical Exam Constitutional:      Appearance: He is well-developed. He is obese.  HENT:     Head: Atraumatic.  Eyes:     Conjunctiva/sclera: Conjunctivae normal.  Cardiovascular:     Rate and Rhythm: Regular rhythm.  Pulmonary:     Effort: No respiratory distress.  Abdominal:     General: There is distension.     Tenderness: There is abdominal tenderness. There is no guarding or rebound.     Comments: Suprapubic catheter in place  Musculoskeletal:        General: Normal  range of motion.     Cervical back: Normal range of motion.     Right lower leg: No edema.     Left lower leg: No edema.  Skin:    General: Skin is warm.  Neurological:     Mental Status: He is alert. Mental status is at baseline.     IMPRESSION / MDM / ASSESSMENT AND PLAN / ED COURSE  I reviewed the triage vital signs and the nursing notes.  Differential diagnosis including sepsis, bowel obstruction, complicated UTI, pneumonia  EKG  No tachycardic or bradycardic dysrhythmias while on cardiac telemetry.  RADIOLOGY I independently reviewed imaging, my interpretation of imaging: CT scan abdomen and pelvis with significant amount of stool.  Read as large stool volume.  Suggesting constipation.  Cholelithiasis but no signs of acute cholecystitis.  Cardiomegaly.  LABS (all labs ordered are listed, but only abnormal results are displayed) Labs interpreted as -    Labs Reviewed  BASIC METABOLIC PANEL - Abnormal; Notable for the following components:  Result Value   CO2 21 (*)    Glucose, Bld 116 (*)    BUN 29 (*)    Calcium 8.7 (*)    All other components within normal limits  CBC - Abnormal; Notable for the following components:   WBC 13.8 (*)    RBC 4.07 (*)    Hemoglobin 11.6 (*)    HCT 36.6 (*)    All other components within normal limits  URINALYSIS, ROUTINE W REFLEX MICROSCOPIC - Abnormal; Notable for the following components:   Color, Urine YELLOW (*)    APPearance CLOUDY (*)    Hgb urine dipstick SMALL (*)    Protein, ur 100 (*)    Leukocytes,Ua LARGE (*)    Bacteria, UA MANY (*)    All other components within normal limits  CBG MONITORING, ED - Abnormal; Notable for the following components:   Glucose-Capillary 107 (*)    All other components within normal limits  CULTURE, BLOOD (ROUTINE X 2)  CULTURE, BLOOD (ROUTINE X 2)  LACTIC ACID, PLASMA  LACTIC ACID, PLASMA  CBG MONITORING, ED     MDM    Blood pressure significantly low.  Felt that 30 cc/kg  of IV fluids may be detrimental to the patient given history of diastolic heart failure, will give 1 L of IV fluids and reevaluate.  On reevaluation continues to be hypotensive, given a second liter of IV fluids.  UA consistent with urinary tract infection.  Started on IV cefepime for complicated UTI.  CT abdomen and pelvis with contrast shows large stool burden.  No signs of a bowel obstruction.  Did note a pneumonia.  Added on azithromycin.  2:21 PM Improvement of blood pressure, MAP above 65.  Repeat glucose 100s  Consulted hospitalist for admission for complicated urinary tract infection and sepsis.  PROCEDURES:  Critical Care performed: yes  .Critical Care  Performed by: Corena Herter, MD Authorized by: Corena Herter, MD   Critical care provider statement:    Critical care time (minutes):  30   Critical care time was exclusive of:  Separately billable procedures and treating other patients   Critical care was necessary to treat or prevent imminent or life-threatening deterioration of the following conditions:  Sepsis   Critical care was time spent personally by me on the following activities:  Development of treatment plan with patient or surrogate, discussions with consultants, evaluation of patient's response to treatment, examination of patient, ordering and review of laboratory studies, ordering and review of radiographic studies, ordering and performing treatments and interventions, pulse oximetry, re-evaluation of patient's condition and review of old charts   Patient's presentation is most consistent with acute presentation with potential threat to life or bodily function.   MEDICATIONS ORDERED IN ED: Medications  lactated ringers infusion ( Intravenous New Bag/Given 09/14/22 1254)  lactated ringers bolus 1,000 mL (has no administration in time range)  azithromycin (ZITHROMAX) 500 mg in sodium chloride 0.9 % 250 mL IVPB (has no administration in time range)  lactated  ringers bolus 1,000 mL (0 mLs Intravenous Stopped 09/14/22 1404)  ceFEPIme (MAXIPIME) 2 g in sodium chloride 0.9 % 100 mL IVPB (0 g Intravenous Stopped 09/14/22 1404)  iohexol (OMNIPAQUE) 300 MG/ML solution 100 mL (100 mLs Intravenous Contrast Given 09/14/22 1331)    FINAL CLINICAL IMPRESSION(S) / ED DIAGNOSES   Final diagnoses:  Weakness  Complicated UTI (urinary tract infection)  Sepsis, due to unspecified organism, unspecified whether acute organ dysfunction present [facility])     Rx /  DC Orders   ED Discharge Orders     None        Note:  This document was prepared using Dragon voice recognition software and may include unintentional dictation errors.   Corena Herter, MD 09/14/22 1421

## 2022-09-14 NOTE — Consult Note (Signed)
PHARMACY - BRIEF ANTIBIOTIC NOTE   Pharmacy has received consult(s) for cefepime from an ED provider. The patient's profile has been reviewed for ht/wt/allergies/indication/available labs.    One time order(s) placed for: cefepime 2 g IV  Further antibiotics/pharmacy consults should be ordered by admitting physician if indicated.                       Thank you,  Will M. Dareen Piano, PharmD PGY-1 Pharmacy Resident 09/14/2022 1:26 PM

## 2022-09-14 NOTE — ED Notes (Signed)
Pt to CT via stretcher. Remains sleeping, arousable to voice, A&Ox4, NAD, calm, interactive. Updated.

## 2022-09-14 NOTE — ED Notes (Signed)
EDP at BS 

## 2022-09-15 ENCOUNTER — Encounter: Payer: Self-pay | Admitting: Internal Medicine

## 2022-09-15 DIAGNOSIS — J441 Chronic obstructive pulmonary disease with (acute) exacerbation: Secondary | ICD-10-CM

## 2022-09-15 DIAGNOSIS — J690 Pneumonitis due to inhalation of food and vomit: Secondary | ICD-10-CM | POA: Diagnosis not present

## 2022-09-15 DIAGNOSIS — N390 Urinary tract infection, site not specified: Secondary | ICD-10-CM | POA: Diagnosis not present

## 2022-09-15 DIAGNOSIS — E872 Acidosis, unspecified: Secondary | ICD-10-CM | POA: Insufficient documentation

## 2022-09-15 LAB — BASIC METABOLIC PANEL
Anion gap: 8 (ref 5–15)
BUN: 24 mg/dL — ABNORMAL HIGH (ref 8–23)
CO2: 20 mmol/L — ABNORMAL LOW (ref 22–32)
Calcium: 8.7 mg/dL — ABNORMAL LOW (ref 8.9–10.3)
Chloride: 109 mmol/L (ref 98–111)
Creatinine, Ser: 0.86 mg/dL (ref 0.61–1.24)
GFR, Estimated: >60 mL/min (ref >60)
Glucose, Bld: 161 mg/dL — ABNORMAL HIGH (ref 70–99)
Potassium: 3.7 mmol/L (ref 3.5–5.1)
Sodium: 137 mmol/L (ref 135–145)

## 2022-09-15 LAB — EXPECTORATED SPUTUM ASSESSMENT W GRAM STAIN, RFLX TO RESP C

## 2022-09-15 LAB — CBC
HCT: 34.8 % — ABNORMAL LOW (ref 39.0–52.0)
Hemoglobin: 11.2 g/dL — ABNORMAL LOW (ref 13.0–17.0)
MCH: 28.7 pg (ref 26.0–34.0)
MCHC: 32.2 g/dL (ref 30.0–36.0)
MCV: 89.2 fL (ref 80.0–100.0)
Platelets: 198 10*3/uL (ref 150–400)
RBC: 3.9 MIL/uL — ABNORMAL LOW (ref 4.22–5.81)
RDW: 14.6 % (ref 11.5–15.5)
WBC: 8.3 10*3/uL (ref 4.0–10.5)
nRBC: 0 % (ref 0.0–0.2)

## 2022-09-15 LAB — CULTURE, RESPIRATORY W GRAM STAIN

## 2022-09-15 LAB — CULTURE, BLOOD (ROUTINE X 2)

## 2022-09-15 LAB — GLUCOSE, CAPILLARY: Glucose-Capillary: 145 mg/dL — ABNORMAL HIGH (ref 70–99)

## 2022-09-15 LAB — URINE CULTURE: Culture: 70000 — AB

## 2022-09-15 LAB — BRAIN NATRIURETIC PEPTIDE: B Natriuretic Peptide: 303.8 pg/mL — ABNORMAL HIGH (ref 0.0–100.0)

## 2022-09-15 MED ORDER — IPRATROPIUM-ALBUTEROL 0.5-2.5 (3) MG/3ML IN SOLN
3.0000 mL | RESPIRATORY_TRACT | Status: DC | PRN
  Filled 2022-09-15: qty 3

## 2022-09-15 MED ORDER — SODIUM BICARBONATE 650 MG PO TABS
650.0000 mg | ORAL_TABLET | Freq: Two times a day (BID) | ORAL | Status: DC
  Administered 2022-09-15 – 2022-09-17 (×5): 650 mg via ORAL
  Filled 2022-09-15 (×5): qty 1

## 2022-09-15 MED ORDER — SENNOSIDES-DOCUSATE SODIUM 8.6-50 MG PO TABS
2.0000 | ORAL_TABLET | Freq: Two times a day (BID) | ORAL | Status: DC
  Administered 2022-09-15 – 2022-09-17 (×3): 2 via ORAL
  Filled 2022-09-15 (×4): qty 2

## 2022-09-15 NOTE — Hospital Course (Signed)
Raymond Hays is a 77 y.o. male with medical history significant of chronic indwelling Foley catheter placement, neurogenic bladder with suprapubic catheter., A-fib on Eliquis, ESBL UTI, spinal stenosis, wheelchair bound, Parkinson's disease, hypertension, diet controled DM, COPD, hypothyroidism, gout, depression with anxiety, BPH, anemia, who presents with generalized weakness.  He was hypotensive on arrival, received IV fluid bolus.  CT scan showed large amount of stool in the colon, right lower lobe consolidation concerning for pneumonia.  Patient also has abnormal urine, urine culture sent out.  Patient was placed on meropenem for complicated urinary tract infection.  6/17: Suprapubic catheter replaced.  Waiting for ID input to decide need for IV antibiotics at discharge for ESBL E. coli sepsis

## 2022-09-15 NOTE — TOC Initial Note (Signed)
Transition of Care [facility]) - Initial/Assessment Note    Patient Details  Name: Raymond Hays MRN: [id number] Date of Birth: [birth year]  [facility] of [facility]) CM/SW Contact:    Liliana Cline, LCSW Phone Number: 09/15/2022, 9:38 AM  Clinical Narrative:                 Per chart review, patient is from [facility] long term care. CSW reached out to Gavin Pound at [facility] to confirm, awaiting response.    Expected Discharge Plan: Skilled Nursing Facility     Patient Goals and CMS Choice            Expected Discharge Plan and Services                                              Prior Living Arrangements/Services                       Activities of Daily Living Home Assistive Devices/Equipment: Wheelchair, Nurse, adult ADL Screening (condition at time of admission) Patient's cognitive ability adequate to safely complete daily activities?: Yes Is the patient deaf or have difficulty hearing?: Yes Does the patient have difficulty seeing, even when wearing glasses/contacts?: Yes (blind left eye) Does the patient have difficulty concentrating, remembering, or making decisions?: Yes Patient able to express need for assistance with ADLs?: Yes Does the patient have difficulty dressing or bathing?: Yes Independently performs ADLs?: No Communication: Independent Dressing (OT): Needs assistance Is this a change from baseline?: Pre-admission baseline Grooming: Needs assistance Is this a change from baseline?: Pre-admission baseline Feeding: Independent Bathing: Dependent Is this a change from baseline?: Pre-admission baseline Toileting: Dependent Is this a change from baseline?: Pre-admission baseline In/Out Bed: Dependent Is this a change from baseline?: Pre-admission baseline Walks in Home: Dependent Is this a change from baseline?: Pre-admission baseline Does the patient have difficulty walking or climbing stairs?: Yes Weakness of Legs: Both Weakness  of Arms/Hands: Both  Permission Sought/Granted                  Emotional Assessment              Admission diagnosis:  Weakness [R53.1] CAP (community acquired pneumonia) [J18.9] Complicated UTI (urinary tract infection) [N39.0] Sepsis, due to unspecified organism, unspecified whether acute organ dysfunction present [facility]) [A41.9] Patient Active Problem List   Diagnosis Date Noted   Metabolic acidosis 09/15/2022   Complicated UTI (urinary tract infection) 09/14/2022   CAP (community acquired pneumonia) 09/14/2022   Hypothyroidism 09/14/2022   Depression with anxiety 09/14/2022   Obesity (BMI 30-39.9) 09/14/2022   Prolonged QT interval 09/14/2022   Chronic indwelling Foley catheter 09/14/2022   Abnormal MRI, thoracic spine (08/23/2022) 09/03/2022   DDD (degenerative disc disease), cervical 08/05/2022   Neurogenic incontinence 08/05/2022   Spinal stenosis, thoracic 08/05/2022   Foraminal stenosis of thoracic region 08/05/2022   Thoracic Facet Arthropathy 08/05/2022   Lower extremity numbness (Bilateral) 08/05/2022   Weakness of lower extremity (Bilateral) 08/05/2022   Chronic midline low back pain with left-sided sciatica 08/05/2022   Left foot drop 08/05/2022   Lumbar postlaminectomy syndrome 08/05/2022   Poor memory 08/05/2022   Vitamin B12 deficiency 07/25/2022   Pharmacologic therapy 07/24/2022   Disorder of skeletal system 07/24/2022   Problems influencing health status 07/24/2022   Elevated sed rate 07/24/2022  Elevated C-reactive protein (CRP) 07/24/2022   Chronic anticoagulation (ELIQUIS) 07/24/2022   Chronic neck and back pain (1ry area of Pain) (Bilateral) (L>R) 07/24/2022   Abnormal MRI, cervical spine (08/23/2022) 07/24/2022   History of cervical spinal surgery 07/24/2022   History of lumbar surgery 07/24/2022   History of thoracic surgery 07/24/2022   Laryngopharyngeal reflux 06/17/2022   Throat clearing 06/17/2022   Acute respiratory failure  (HCC) 04/03/2021   E. coli UTI    Herpes zoster conjunctivitis of left eye    Severe sepsis with septic shock (HCC) 03/16/2021   Obstipation 01/30/2021   Anxiety 01/30/2021   Iron deficiency anemia    Edema leg    Respiratory failure (HCC) 01/09/2021   Ileus (HCC)    Neck pain    AKI (acute kidney injury) (HCC)    Type 2 diabetes mellitus with diabetic neuropathy, without long-term current use of insulin (HCC)    Parkinson disease    Sepsis (HCC) 10/16/2020   Status post implantation of mitral valve leaflet clip 06/27/2020   Chronic respiratory failure with hypoxia (HCC) 01/10/2020   Impaired functional mobility, balance, gait, and endurance 09/15/2019   Catheter cystitis (HCC) 10/16/2018   Urinary retention 07/14/2018   COVID-19 virus detected 07/03/2018   Benign prostatic hyperplasia with lower urinary tract symptoms 08/06/2017   Osteoarthritis of right knee 06/19/2017   S/P orthopedic surgery, follow-up exam 10/25/2016   Tendon rupture of wrist, sequela 01/02/2016   Rotator cuff tear arthropathy of right shoulder 09/28/2015   Acute pain of right knee 08/03/2015   Chronic pain of right knee 08/03/2015   Calcific tendinitis of left shoulder 07/05/2015   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/05/2015   Primary osteoarthritis of left shoulder 07/05/2015   Gait difficulty 06/26/2015   Severe mitral regurgitation 06/05/2015   AF (paroxysmal atrial fibrillation) (HCC) 05/31/2015   Chronic pain syndrome 05/31/2015   COPD with acute exacerbation (HCC) 05/31/2015   Essential hypertension 05/31/2015   Atrial fibrillation (HCC) 05/31/2015   COPD exacerbation (HCC) 05/31/2015   Chronic pain of left wrist 04/24/2015   Arthritis of left wrist 04/24/2015   Rupture of extensor tendon of left hand 04/24/2015   PCP:  [facility]:   [facility] - [place], [place] - [address] [address] [facility] [place] [address] Phone: [phone] Fax:  [phone]  [facility] - [place], [place] - [address] [place] [address] Phone: [phone] Fax: [phone]     Social Determinants of Health (SDOH) Social History: SDOH Screenings   Food Insecurity: No Food Insecurity (09/14/2022)  Housing: Low Risk  (09/14/2022)  Transportation Needs: No Transportation Needs (09/14/2022)  Utilities: Not At Risk (09/14/2022)  Depression (PHQ2-9): Low Risk  (08/05/2022)  Tobacco Use: Medium Risk (09/15/2022)   SDOH Interventions:     Readmission Risk Interventions    01/12/2021    1:08 PM 10/20/2020    8:59 AM  Readmission Risk Prevention Plan  Transportation Screening Complete Complete  PCP or Specialist Appt within 3-5 Days  Complete  HRI or Home Care Consult  Complete  Palliative Care Screening  Not Applicable  Medication Review (RN Care Manager) Complete Referral to Pharmacy  PCP or Specialist appointment within 3-5 days of discharge Complete   HRI or Home Care Consult Complete   SW Recovery Care/Counseling Consult Complete   Palliative Care Screening Not Applicable   Skilled Nursing Facility Complete

## 2022-09-15 NOTE — Progress Notes (Signed)
   09/15/22 0700  Spiritual Encounters  Type of Visit Initial  Care provided to: Patient  Referral source Code page  Reason for visit Code  OnCall Visit Yes   Chaplain responded to Code page. No family present.

## 2022-09-15 NOTE — Evaluation (Signed)
Clinical/Bedside Swallow Evaluation Patient Details  Name: Raymond Hays MRN: [id number] Date of Birth: [birth year]  Today's Date: 09/15/2022 Time: SLP Start Time (ACUTE ONLY): 1258 SLP Stop Time (ACUTE ONLY): 1317 SLP Time Calculation (min) (ACUTE ONLY): 19 min  Past Medical History:  Past Medical History:  Diagnosis Date   Allergic rhinitis    Anxiety    Arthritis    Atrial fibrillation (HCC)    Chronic indwelling Foley catheter    COPD (chronic obstructive pulmonary disease) (HCC)    Cough    Diabetes (HCC)    Essential (primary) hypertension    Hemorrhoid    Hyperlipidemia    PAF (paroxysmal atrial fibrillation) (HCC)    Primary osteoarthritis, unspecified shoulder    Retention of urine, unspecified    Spinal stenosis    UTI (urinary tract infection)    Past Surgical History:  Past Surgical History:  Procedure Laterality Date   APPENDECTOMY     BACK SURGERY     COLONOSCOPY N/A 10/27/2020   Procedure: COLONOSCOPY;  Surgeon: Wyline Mood, MD;  Location: [facility] ENDOSCOPY;  Service: Gastroenterology;  Laterality: N/A;   ELBOW SURGERY     ESOPHAGOGASTRODUODENOSCOPY (EGD) WITH PROPOFOL N/A 10/27/2020   Procedure: ESOPHAGOGASTRODUODENOSCOPY (EGD) WITH PROPOFOL;  Surgeon: Wyline Mood, MD;  Location: [facility] ENDOSCOPY;  Service: Gastroenterology;  Laterality: N/A;   HERNIA REPAIR     KNEE SURGERY     neck     PARTIAL HIP ARTHROPLASTY Bilateral    TEE WITHOUT CARDIOVERSION N/A 10/19/2020   Procedure: TRANSESOPHAGEAL ECHOCARDIOGRAM (TEE);  Surgeon: Antonieta Iba, MD;  Location: [facility] ORS;  Service: Cardiovascular;  Laterality: N/A;   TONSILLECTOMY     HPI:  Pt is 77 y.o. male who presents to ED with c/o generalized weakness. Pt with medical history significant of chronic indwelling Foley catheter placement, neurogenic bladder, A-fib on Eliquis, ESBL UTI, spinal stenosis, wheelchair bound, Parkinson's disease, hypertension, diet controled DM, COPD, hypothyroidism, gout, depression with  anxiety, BPH, anemia. CT abdomen pelvis "1. Moderate to large volume of stool in the colon and rectum  suggesting constipation. No other definite acute findings  confidently identified in the abdomen or pelvis.  2. Atelectasis and consolidation in the right lower lobe concerning  for pneumonia.  3. Cholelithiasis without evidence of acute cholecystitis.  4. Cardiomegaly.  5. Aortic atherosclerosis, in addition to left main and three-vessel  coronary artery disease. Assessment for potential risk factor  modification, dietary therapy or pharmacologic therapy may be  warranted, if clinically indicated.  6. Additional incidental findings, as above."    Assessment / Plan / Recommendation  Clinical Impression  Pt seen for clinical swallowing evaluation. Pt alert and cooperative. Audible wheezing and harsh, generally non-productive cough appreciated. On 2L/min O2 via [place]. Pt denie s difficulty swallowing with exception of selfom difficulty with dry, particulate solids. Pt presents with s/sx mild oral dysphagia c/b prolonged mastication of solids likely due to dental status. Oral phase functional with soft solids, purees, and thin liquids. Pt with intermittent harsh and generally non-productive cough at baseline which was present throughout evaluation. Difficult to discern if related to POs. Recommend diet downgrade to mech soft with thin liquids with safe swallowing strategies/aspiration precautions as outlined below. SLP to f/u per POC for diet tolerance and consideration for instrumental swallowing evaluation. SLP Visit Diagnosis: Dysphagia, oropharyngeal phase (R13.12)    Aspiration Risk  Mild aspiration risk;Moderate aspiration risk    Diet Recommendation Dysphagia 3 (Mech soft);Thin liquid   Liquid Administration via: Spoon;Cup;No straw  Medication Administration:  (as tolerated) Compensations: Slow rate;Small sips/bites (rest breaks PRN) Postural Changes: Seated upright at 90 degrees;Remain upright for  at least 30 minutes after po intake    Other  Recommendations Oral Care Recommendations: Oral care BID    Recommendations for follow up therapy are one component of a multi-disciplinary discharge planning process, led by the attending physician.  Recommendations may be updated based on patient status, additional functional criteria and insurance authorization.  Follow up Recommendations  (TBD)      Assistance Recommended at Discharge    Functional Status Assessment Patient has had a recent decline in their functional status and demonstrates the ability to make significant improvements in function in a reasonable and predictable amount of time.  Frequency and Duration min 2x/week  2 weeks       Prognosis Prognosis for improved oropharyngeal function: Fair      Swallow Study   General Date of Onset: 09/14/22 HPI: Pt is 77 y.o. male who presents to ED with c/o generalized weakness. Pt with medical history significant of chronic indwelling Foley catheter placement, neurogenic bladder, A-fib on Eliquis, ESBL UTI, spinal stenosis, wheelchair bound, Parkinson's disease, hypertension, diet controled DM, COPD, hypothyroidism, gout, depression with anxiety, BPH, anemia. CT abdomen pelvis "1. Moderate to large volume of stool in the colon and rectum  suggesting constipation. No other definite acute findings  confidently identified in the abdomen or pelvis.  2. Atelectasis and consolidation in the right lower lobe concerning  for pneumonia.  3. Cholelithiasis without evidence of acute cholecystitis.  4. Cardiomegaly.  5. Aortic atherosclerosis, in addition to left main and three-vessel  coronary artery disease. Assessment for potential risk factor  modification, dietary therapy or pharmacologic therapy may be  warranted, if clinically indicated.  6. 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) Self-Feeding Abilities: Able to feed self Patient Positioning: Upright in bed Baseline Vocal Quality: Normal Volitional Cough: Strong Volitional Swallow: Able to elicit    Oral/Motor/Sensory Function Overall Oral Motor/Sensory Function: Within functional limits   Ice Chips Ice chips: Not tested   Thin Liquid Thin Liquid: Within functional limits Presentation: Cup;Straw;Self Fed    Nectar Thick Nectar Thick Liquid: Not tested   Honey Thick Honey Thick Liquid: Not tested   Puree Puree: Within functional limits Presentation: Self Fed   Solid     Solid: Impaired Oral Phase Impairments: Impaired mastication Oral Phase Functional Implications: Impaired mastication Pharyngeal Phase Impairments:  [facility]) Other Comments: baseline harsh and generally non-productive cough appreciated prior to and throughout evaluation      Woodroe Chen 09/15/2022,1:30 PM

## 2022-09-15 NOTE — Significant Event (Addendum)
Rapid Response Event Note   Reason for Call : called RRT for pt c/o SOB, and possible SVT episode...   Initial Focused Assessment: laying in bed, alert, communicative, supplemental 02 via [place] in place, rate on tele monitor in 70's at this time. Pt states he is feeling better, and asks where the menu is so he can order food...       Interventions: Notified Dr Chipper Herb who says he will be in, in a bit.    Plan of Care: as above, Ramone, RN to call for further assistance.    Event Summary: as above  MD Notified: Zhang 0715 Call [phone] Arrival [phone] End QMVH:[id number]  Osmany Azer A, RN

## 2022-09-15 NOTE — Progress Notes (Signed)
Progress Note   Patient: Raymond Hays ZOX:[id number] DOB: [birth year] DOA: 09/14/2022     1 DOS: the patient was seen and examined on 09/15/2022   Brief hospital course: Raymond Hays is a 77 y.o. male with medical history significant of chronic indwelling Foley catheter placement, neurogenic bladder with suprapubic catheter., A-fib on Eliquis, ESBL UTI, spinal stenosis, wheelchair bound, Parkinson's disease, hypertension, diet controled DM, COPD, hypothyroidism, gout, depression with anxiety, BPH, anemia, who presents with generalized weakness.  He was hypotensive on arrival, received IV fluid bolus.  CT scan showed large amount of stool in the colon, right lower lobe consolidation concerning for pneumonia.  Patient also has abnormal urine, urine culture sent out.  Patient was placed on meropenem for complicated urinary tract infection.   Principal Problem:   Complicated UTI (urinary tract infection) Active Problems:   Chronic indwelling Foley catheter   COPD exacerbation (HCC)   Sepsis (HCC)   AF (paroxysmal atrial fibrillation) (HCC)   Essential hypertension   Parkinson disease   Hypothyroidism   Prolonged QT interval   Depression with anxiety   Obesity (BMI 30-39.9)   Severe sepsis (HCC)   Metabolic acidosis   Aspiration pneumonia of right lower lobe (HCC)   Assessment and Plan:  Complicated UTI (urinary tract infection) secondary to suprapubic catheter. Suprapubic catheter. Severe sepsis secondary to UTI. Patient met septic criteria, he also has significant hypotension.  Met severe sepsis criteria. He also has a chronic indwelling suprapubic catheter, discussed with the nurse, it need to be replaced  tomorrow. Urine culture is pending, patient had a history of ESBL, continue meropenem.  COPD exacerbation (HCC)  Right lower lobe aspiration pneumonia. Patient no longer has any bronchospasm today.  I reviewed patient CT scan, patient does has a right lower lobe consolidation.   Patient also had a significant choking when eating, high risk for aspiration.  Procalcitonin level less than 0.1, this is most likely aspiration pneumonia.  Mostly chemical pneumonia, patient is covered with meropenem, no need for doxycycline. Patient also has a mild elevation of BNP, not grossly volume overloaded.  Repeat echocardiogram.  AF (paroxysmal atrial fibrillation) (HCC): HR 80s. -Eliquis   Essential hypertension:  Patient was hypotensive at time of admission.  Hold off most blood pressure medicines.   Parkinson disease -Sinemet -Fall precaution   Hypothyroidism -Synthroid   Prolonged QT interval: QTc 658 Continue to follow.   Depression with anxiety -Hold Celexa due to QTc prolonged patient   Obesity (BMI 30-39.9): Body weight well 5.2 kg, BMI 36.32 -Healthy diet and exercise -Encourage losing weight   Severe debility. Patient is wheelchair-bound at baseline.     Subjective:  Patient complaining of short of breath with exertion.  Physical Exam: Vitals:   09/15/22 0014 09/15/22 0531 09/15/22 0718 09/15/22 0818  BP:  (!) 123/93 (!) 144/92 (!) 133/96  Pulse:  75 (!) 122 84  Resp:  16 20 18   Temp:  97.9 F (36.6 C)  98.1 F (36.7 C)  TempSrc:  Oral  Oral  SpO2: 94% 100% 97% 99%  Weight:      Height:       General exam: Appears calm and comfortable  Respiratory system: Rhonchi in the base. Respiratory effort normal. Cardiovascular system: S1 & S2 heard, RRR. No JVD, murmurs, rubs, gallops or clicks. No pedal edema. Gastrointestinal system: Abdomen is nondistended, soft and nontender. No organomegaly or masses felt. Normal bowel sounds heard. Central nervous system: Alert and oriented x2. No focal neurological deficits. Extremities:  Symmetric 5 x 5 power. Skin: No rashes, lesions or ulcers Psychiatry:  Mood & affect appropriate.    Data Reviewed:  Reviewed CT scan results, lab results.  Family Communication: None  Disposition: Status is:  Inpatient Remains inpatient appropriate because: Severity of disease, IV treatment.     Time spent: 35 minutes  Author: Marrion Coy, MD 09/15/2022 12:51 PM  For on call review [email or web address].

## 2022-09-16 ENCOUNTER — Inpatient Hospital Stay (HOSPITAL_COMMUNITY)
Admit: 2022-09-16 | Discharge: 2022-09-16 | Disposition: A | Discharge status: Home / Self Care | Payer: No Typology Code available for payment source | Attending: Internal Medicine | Admitting: Internal Medicine

## 2022-09-16 DIAGNOSIS — Z978 Presence of other specified devices: Secondary | ICD-10-CM

## 2022-09-16 DIAGNOSIS — G20B1 Parkinson's disease with dyskinesia, without mention of fluctuations: Secondary | ICD-10-CM

## 2022-09-16 DIAGNOSIS — I480 Paroxysmal atrial fibrillation: Secondary | ICD-10-CM | POA: Diagnosis not present

## 2022-09-16 DIAGNOSIS — A408 Other streptococcal sepsis: Secondary | ICD-10-CM

## 2022-09-16 DIAGNOSIS — I509 Heart failure, unspecified: Secondary | ICD-10-CM | POA: Diagnosis not present

## 2022-09-16 DIAGNOSIS — N179 Acute kidney failure, unspecified: Secondary | ICD-10-CM

## 2022-09-16 DIAGNOSIS — J441 Chronic obstructive pulmonary disease with (acute) exacerbation: Secondary | ICD-10-CM | POA: Diagnosis not present

## 2022-09-16 DIAGNOSIS — R6520 Severe sepsis without septic shock: Secondary | ICD-10-CM

## 2022-09-16 LAB — HEPATIC FUNCTION PANEL
ALT: 11 U/L (ref 0–44)
AST: 16 U/L (ref 15–41)
Albumin: 3.4 g/dL — ABNORMAL LOW (ref 3.5–5.0)
Alkaline Phosphatase: 87 U/L (ref 38–126)
Bilirubin, Direct: 0.1 mg/dL (ref 0.0–0.2)
Indirect Bilirubin: 0.6 mg/dL (ref 0.3–0.9)
Total Bilirubin: 0.7 mg/dL (ref 0.3–1.2)
Total Protein: 6.9 g/dL (ref 6.5–8.1)

## 2022-09-16 LAB — CULTURE, RESPIRATORY W GRAM STAIN

## 2022-09-16 LAB — URINE CULTURE

## 2022-09-16 LAB — ECHOCARDIOGRAM COMPLETE
AR max vel: 1.9 cm2
AV Area VTI: 1.99 cm2
AV Area mean vel: 1.79 cm2
AV Mean grad: 3 mmHg
AV Peak grad: 5.7 mmHg
Ao pk vel: 1.19 m/s
Area-P 1/2: 3.39 cm2
Height: 67 in
MV VTI: 1.52 cm2
S' Lateral: 2.9 cm
Weight: 3664.93 oz

## 2022-09-16 LAB — BASIC METABOLIC PANEL
Anion gap: 10 (ref 5–15)
BUN: 34 mg/dL — ABNORMAL HIGH (ref 8–23)
CO2: 21 mmol/L — ABNORMAL LOW (ref 22–32)
Calcium: 8.9 mg/dL (ref 8.9–10.3)
Chloride: 110 mmol/L (ref 98–111)
Creatinine, Ser: 0.88 mg/dL (ref 0.61–1.24)
GFR, Estimated: >60 mL/min (ref >60)
Glucose, Bld: 162 mg/dL — ABNORMAL HIGH (ref 70–99)
Potassium: 3.8 mmol/L (ref 3.5–5.1)
Sodium: 141 mmol/L (ref 135–145)

## 2022-09-16 LAB — MAGNESIUM: Magnesium: 2.1 mg/dL (ref 1.7–2.4)

## 2022-09-16 LAB — CULTURE, BLOOD (ROUTINE X 2)
Culture: NO GROWTH
Special Requests: ADEQUATE

## 2022-09-16 LAB — PROCALCITONIN: Procalcitonin: <0.1 ng/mL

## 2022-09-16 MED ORDER — HYDROCORTISONE (PERIANAL) 2.5 % EX CREA
TOPICAL_CREAM | Freq: Two times a day (BID) | CUTANEOUS | Status: DC
  Administered 2022-09-16: 1 via TOPICAL
  Filled 2022-09-16: qty 28.35

## 2022-09-16 NOTE — NC FL2 (Signed)
[place] MEDICAID FL2 LEVEL OF CARE FORM     IDENTIFICATION  Patient Name: Raymond Hays Birthdate: [birth year] Sex: male Admission Date (Current Location): 09/14/2022  [facility] and [place] Number:  Chiropodist and Address:         Provider Number: [id number]  Attending Physician Name and Address:  Delfino Lovett, MD  Relative Name and Phone Number:       Current Level of Care: Hospital Recommended Level of Care: Nursing Facility Prior Approval Number:    Date Approved/Denied:   PASRR Number: [id number]  Discharge Plan: Home    Current Diagnoses: Patient Active Problem List   Diagnosis Date Noted   Metabolic acidosis 09/15/2022   Aspiration pneumonia of right lower lobe (HCC) 09/15/2022   Complicated UTI (urinary tract infection) 09/14/2022   Hypothyroidism 09/14/2022   Depression with anxiety 09/14/2022   Obesity (BMI 30-39.9) 09/14/2022   Prolonged QT interval 09/14/2022   Chronic indwelling Foley catheter 09/14/2022   Abnormal MRI, thoracic spine (08/23/2022) 09/03/2022   DDD (degenerative disc disease), cervical 08/05/2022   Neurogenic incontinence 08/05/2022   Spinal stenosis, thoracic 08/05/2022   Foraminal stenosis of thoracic region 08/05/2022   Thoracic Facet Arthropathy 08/05/2022   Lower extremity numbness (Bilateral) 08/05/2022   Weakness of lower extremity (Bilateral) 08/05/2022   Chronic midline low back pain with left-sided sciatica 08/05/2022   Left foot drop 08/05/2022   Lumbar postlaminectomy syndrome 08/05/2022   Poor memory 08/05/2022   Vitamin B12 deficiency 07/25/2022   Pharmacologic therapy 07/24/2022   Disorder of skeletal system 07/24/2022   Problems influencing health status 07/24/2022   Elevated sed rate 07/24/2022   Elevated C-reactive protein (CRP) 07/24/2022   Chronic anticoagulation (ELIQUIS) 07/24/2022   Chronic neck and back pain (1ry area of Pain) (Bilateral) (L>R) 07/24/2022   Abnormal MRI, cervical spine  (08/23/2022) 07/24/2022   History of cervical spinal surgery 07/24/2022   History of lumbar surgery 07/24/2022   History of thoracic surgery 07/24/2022   Laryngopharyngeal reflux 06/17/2022   Throat clearing 06/17/2022   Acute respiratory failure (HCC) 04/03/2021   E. coli UTI    Herpes zoster conjunctivitis of left eye    Severe sepsis (HCC) 03/16/2021   Obstipation 01/30/2021   Anxiety 01/30/2021   Iron deficiency anemia    Edema leg    Respiratory failure (HCC) 01/09/2021   Ileus (HCC)    Neck pain    AKI (acute kidney injury) (HCC)    Type 2 diabetes mellitus with diabetic neuropathy, without long-term current use of insulin (HCC)    Parkinson disease    Sepsis (HCC) 10/16/2020   Status post implantation of mitral valve leaflet clip 06/27/2020   Chronic respiratory failure with hypoxia (HCC) 01/10/2020   Impaired functional mobility, balance, gait, and endurance 09/15/2019   Catheter cystitis (HCC) 10/16/2018   Urinary retention 07/14/2018   COVID-19 virus detected 07/03/2018   Benign prostatic hyperplasia with lower urinary tract symptoms 08/06/2017   Osteoarthritis of right knee 06/19/2017   S/P orthopedic surgery, follow-up exam 10/25/2016   Tendon rupture of wrist, sequela 01/02/2016   Rotator cuff tear arthropathy of right shoulder 09/28/2015   Acute pain of right knee 08/03/2015   Chronic pain of right knee 08/03/2015   Calcific tendinitis of left shoulder 07/05/2015   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/05/2015   Primary osteoarthritis of left shoulder 07/05/2015   Gait difficulty 06/26/2015   Severe mitral regurgitation 06/05/2015   AF (paroxysmal atrial fibrillation) (HCC) 05/31/2015   Chronic  pain syndrome 05/31/2015   COPD with acute exacerbation (HCC) 05/31/2015   Essential hypertension 05/31/2015   Atrial fibrillation (HCC) 05/31/2015   COPD exacerbation (HCC) 05/31/2015   Chronic pain of left wrist 04/24/2015   Arthritis of left wrist 04/24/2015    Rupture of extensor tendon of left hand 04/24/2015    Orientation RESPIRATION BLADDER Height & Weight     Time, Self, Situation, Place  Normal  (suprapubic) Weight: 103.9 kg Height:  5\' 7"  (170.2 cm)  BEHAVIORAL SYMPTOMS/MOOD NEUROLOGICAL BOWEL NUTRITION STATUS      Continent Diet (dys3)  AMBULATORY STATUS COMMUNICATION OF NEEDS Skin   Total Care   Normal                       Personal Care Assistance Level of Assistance              Functional Limitations Info             SPECIAL CARE FACTORS FREQUENCY                       Contractures Contractures Info: Not present    Additional Factors Info  Code Status, Allergies Code Status Info: DNR Allergies Info: NKDA           Current Medications (09/16/2022):  This is the current hospital active medication list Current Facility-Administered Medications  Medication Dose Route Frequency Provider Last Rate Last Admin   acetaminophen (TYLENOL) tablet 650 mg  650 mg Oral Q6H PRN Lorretta Harp, MD       albuterol (PROVENTIL) (2.5 MG/3ML) 0.083% nebulizer solution 3 mL  3 mL Inhalation Q4H PRN Lorretta Harp, MD       allopurinol (ZYLOPRIM) tablet 100 mg  100 mg Oral Daily Lorretta Harp, MD   100 mg at 09/15/22 1018   apixaban (ELIQUIS) tablet 5 mg  5 mg Oral BID Lorretta Harp, MD   5 mg at 09/15/22 2208   atorvastatin (LIPITOR) tablet 20 mg  20 mg Oral QHS Lorretta Harp, MD   20 mg at 09/15/22 2208   carbidopa-levodopa (SINEMET IR) 25-100 MG per tablet immediate release 1 tablet  1 tablet Oral TID Langley Gauss, MD   1 tablet at 09/15/22 1706   cholecalciferol (VITAMIN D3) tablet 1,000 Units  1,000 Units Oral Daily Lorretta Harp, MD   1,000 Units at 09/15/22 1018   dextromethorphan-guaiFENesin (MUCINEX DM) 30-600 MG per 12 hr tablet 1 tablet  1 tablet Oral BID PRN Lorretta Harp, MD   1 tablet at 09/15/22 1258   diphenhydrAMINE (BENADRYL) injection 12.5 mg  12.5 mg Intravenous Q8H PRN Lorretta Harp, MD   12.5 mg at 09/14/22 2351    fluticasone (FLONASE) 50 MCG/ACT nasal spray 2 spray  2 spray Each Nare Daily Lorretta Harp, MD   2 spray at 09/15/22 1020   gabapentin (NEURONTIN) capsule 800 mg  800 mg Oral BID Lorretta Harp, MD   800 mg at 09/15/22 2208   hydrALAZINE (APRESOLINE) injection 5 mg  5 mg Intravenous Q2H PRN Lorretta Harp, MD       hydrocortisone (ANUSOL-HC) 2.5 % rectal cream   Topical BID Sherryll Burger, Vipul, MD       ipratropium-albuterol (DUONEB) 0.5-2.5 (3) MG/3ML nebulizer solution 3 mL  3 mL Nebulization Q4H PRN Marrion Coy, MD       levothyroxine (SYNTHROID) tablet 75 mcg  75 mcg Oral QHS Lorretta Harp, MD   75 mcg at 09/16/22 [phone]  melatonin tablet 10 mg  10 mg Oral QHS Lorretta Harp, MD   10 mg at 09/15/22 2208   meropenem (MERREM) 1 g in sodium chloride 0.9 % 100 mL IVPB  1 g Intravenous Q8H Dorothea Ogle B, RPH 200 mL/hr at 09/16/22 0003 1 g at 09/16/22 0003   methocarbamol (ROBAXIN) tablet 1,000 mg  1,000 mg Oral BID Lorretta Harp, MD   1,000 mg at 09/15/22 2208   oxybutynin (DITROPAN) tablet 5 mg  5 mg Oral BID Lorretta Harp, MD   5 mg at 09/15/22 2208   pantoprazole (PROTONIX) EC tablet 40 mg  40 mg Oral Daily Lorretta Harp, MD   40 mg at 09/15/22 1018   polyethylene glycol (MIRALAX / GLYCOLAX) packet 17 g  17 g Oral Daily PRN Lorretta Harp, MD       senna-docusate (Senokot-S) tablet 2 tablet  2 tablet Oral BID Marrion Coy, MD   2 tablet at 09/15/22 2208   silver sulfADIAZINE (SILVADENE) 1 % cream 1 Application  1 Application Topical Daily Lorretta Harp, MD   1 Application at 09/15/22 1029   sodium bicarbonate tablet 650 mg  650 mg Oral BID Marrion Coy, MD   650 mg at 09/15/22 2211   terbinafine (LAMISIL) 1 % cream 1 Application  1 Application Topical QHS Lorretta Harp, MD   1 Application at 09/15/22 2209   triamcinolone cream (KENALOG) 0.5 %   Topical BID PRN Lorretta Harp, MD   Given at 09/15/22 2040   umeclidinium bromide (INCRUSE ELLIPTA) 62.5 MCG/ACT 1 puff  1 puff Inhalation Daily Lorretta Harp, MD   1 puff at 09/15/22 1035      Discharge Medications: Please see discharge summary for a list of discharge medications.  Relevant Imaging Results:  Relevant Lab Results:   Additional Information SSN: [id number]  Chapman Fitch, RN

## 2022-09-16 NOTE — Discharge Summary (Incomplete)
Physician Discharge Summary   Patient: Raymond Hays MRN: [id number] DOB: [birth year]  Admit date:     09/14/2022  Discharge date: {dischdate:26783}  Discharge Physician: Delfino Lovett   PCP: [facility]   Recommendations at discharge:  {Tip this will not be part of the note when signed- Example include specific recommendations for outpatient follow-up, pending tests to follow-up on. (Optional):26781}  F/up with outpt providers as requested  Mech soft consistency diet w/ well-Cut meats, moistened foods; Thin liquids -- monitor straw use when drinking in bed. Recommend general aspiration precautions, REFLUX precautions. Tray setup and help sitting fully upright when eating/drinking in bed. Pills WHOLE in Puree for safer, easier swallowing if desired. Education given on Pills in Puree; food consistencies and easy to eat options; particulate foods and prep or avoidance of them; general aspiration and REFLUX precautions to pt.   Discharge Diagnoses: Principal Problem:   Complicated UTI (urinary tract infection) Active Problems:   Chronic indwelling Foley catheter   COPD exacerbation (HCC)   Sepsis (HCC)   AF (paroxysmal atrial fibrillation) (HCC)   Essential hypertension   Parkinson disease   Hypothyroidism   Prolonged QT interval   Depression with anxiety   Obesity (BMI 30-39.9)   Severe sepsis (HCC)   Metabolic acidosis   Aspiration pneumonia of right lower lobe [facility])  Hospital Course: Raymond Hays is a 77 y.o. male with medical history significant of chronic indwelling Foley catheter placement, neurogenic bladder with suprapubic catheter., A-fib on Eliquis, ESBL UTI, spinal stenosis, wheelchair bound, Parkinson's disease, hypertension, diet controled DM, COPD, hypothyroidism, gout, depression with anxiety, BPH, anemia, who presents with generalized weakness.  He was hypotensive on arrival, received IV fluid bolus.  CT scan showed large amount of stool in the colon, right lower lobe  consolidation concerning for pneumonia.  Patient also has abnormal urine, urine culture sent out.  Patient was placed on meropenem for complicated urinary tract infection.  6/17: Suprapubic catheter replaced.  Waiting for ID input to decide need for IV antibiotics at discharge for ESBL E. coli sepsis  Assessment and Plan:  Transient Hypotension could be to pneumonia UTI ruled out Suprapubic catheter - changed over today on 6/17 Severe sepsis - Ruled out He also has a chronic indwelling suprapubic catheter, discussed with the nurse, it is replaced today Hypotension with weakness on presentation No fever , no leucocytosis, no lactate . No procal His suprapubic cathetar is likely is colonized with ESBl organism CT did not show any pyelonephritis Treated with 3 days of Meropenem   COPD exacerbation (HCC)  Right lower lobe aspiration pneumonia. Patient no longer has any bronchospasm.  I reviewed patient CT scan, patient does has a right lower lobe consolidation.  Patient also had a significant choking when eating, high risk for aspiration.  Procalcitonin level less than 0.1, this is most likely aspiration pneumonia.  Mostly chemical pneumonia, patient treated with meropenem, no need for doxycycline. Repeat echocardiogram showed EF 55-60% ST recommends to continue a more mech soft consistency diet w/ well-Cut meats, moistened foods; Thin liquids -- monitor straw use when drinking in bed. Recommend general aspiration precautions, REFLUX precautions. Tray setup and help sitting fully upright when eating/drinking in bed. Pills WHOLE in Puree for safer, easier swallowing if desired. Education given on Pills in Puree; food consistencies and easy to eat options; particulate foods and prep or avoidance of them; general aspiration and REFLUX precautions to pt.    AF (paroxysmal atrial fibrillation) (HCC): rate controlled -Eliquis  Essential hypertension:  Patient was hypotensive at time of admission.   Transient and resolved   Parkinson disease -Sinemet -Fall precaution   Hypothyroidism -Synthroid   Prolonged QT interval: QTc 658 on admission, avoid QT prolonging meds   Depression with anxiety -Held Celexa due to QTc prolonged patient while in the Hospital. Resume at DC   Obesity (BMI 30-39.9): Body weight well 5.2 kg, BMI 36.32 -Healthy diet and exercise -Encourage losing weight   Severe debility. Patient is wheelchair-bound at baseline.      {Tip this will not be part of the note when signed Body mass index is 35.88 kg/m. , ,  (Optional):26781}  {(NOTE) Pain control PDMP Statment (Optional):26782} Consultants: ID Disposition: Long term care facility Diet recommendation:  Carb modified diet DISCHARGE MEDICATION: Allergies as of 09/16/2022   No Known Allergies   Med Rec must be completed prior to using this [facility]***       Discharge Exam: Filed Weights   09/14/22 1126 09/14/22 1220 09/14/22 2206  Weight: 97.5 kg 105.2 kg 103.9 kg   General exam: Appears calm and comfortable  Respiratory system: Clear to auscultation bilaterally. Respiratory effort normal. Cardiovascular system: S1 & S2 heard, RRR. No JVD, murmurs, rubs, gallops or clicks. No pedal edema. Gastrointestinal system: Abdomen is nondistended, soft and nontender. No organomegaly or masses felt. Normal bowel sounds heard. Central nervous system: Alert and oriented x2. No focal neurological deficits. Extremities: Symmetric 5 x 5 power. Skin: No rashes, lesions or ulcers Psychiatry:  Mood & affect appropriate.   Condition at discharge: fair  The results of significant diagnostics from this hospitalization (including imaging, microbiology, ancillary and laboratory) are listed below for reference.   Imaging Studies: ECHOCARDIOGRAM COMPLETE  Result Date: 09/16/2022    ECHOCARDIOGRAM REPORT   Patient Name:   Raymond Hays Date of Exam: 09/16/2022 Medical Rec #:  [id number]  Height:       67.0 in  Accession #:    [id number] Weight:       229.1 lb Date of Birth:  [birth year]  BSA:          2.142 m Patient Age:    76 years   BP:           145/109 mmHg Patient Gender: M          HR:           99 bpm. Exam Location:  [facility] Procedure: 2D Echo, Cardiac Doppler and Color Doppler Indications:     CHF  History:         Patient has prior history of Echocardiogram examinations, most                  recent 10/19/2020. CHF, COPD, Arrythmias:Atrial Fibrillation,                  Signs/Symptoms:Edema and Bacteremia; Risk Factors:Hypertension                  and Diabetes. Parkinson's, A Mitra clip valve is present in the                  Mitral position. History of ASD.  Sonographer:     Mikki Harbor Referring Phys:  [id number] Marrion Coy Diagnosing Phys: Yvonne Kendall MD  Sonographer Comments: Technically difficult study due to poor echo windows, patient is obese and no subcostal window. Image acquisition challenging due to COPD. IMPRESSIONS  1. Left ventricular ejection fraction, by estimation, is 55 to 60%. The left  ventricle has normal function. The left ventricle has no regional wall motion abnormalities. Left ventricular diastolic parameters are indeterminate.  2. Pulmonary artery pressure is at least mildly to moderately elevated (RVSP 35-40% plus central venous/right atrial pressure). Right ventricular systolic function is low normal. The right ventricular size is moderately enlarged.  3. Left atrial size was severely dilated.  4. The mitral valve has been repaired/replaced with a Mitra-Clip device. There is at least mild to moderate mitral valve regurgitation, though severity may be underestimated due to jet eccentricity.     No evidence of mitral stenosis.  5. Right atrial size was severely dilated.  6. Tricuspid valve regurgitation is moderate.  7. The aortic valve is tricuspid. Aortic valve regurgitation is not visualized. No aortic stenosis is present. FINDINGS  Left Ventricle: Left ventricular ejection  fraction, by estimation, is 55 to 60%. The left ventricle has normal function. The left ventricle has no regional wall motion abnormalities. The left ventricular internal cavity size was normal in size. There is  no left ventricular hypertrophy. Left ventricular diastolic parameters are indeterminate. Right Ventricle: Pulmonary artery pressure is at least mildly to moderately elevated (RVSP 35-40% plus central venous/right atrial pressure). The right ventricular size is moderately enlarged. No increase in right ventricular wall thickness. Right ventricular systolic function is low normal. Left Atrium: Left atrial size was severely dilated. Right Atrium: Right atrial size was severely dilated. Pericardium: The pericardium was not well visualized. Mitral Valve: The mitral valve has been repaired/replaced with a Mitra-Clip device. There is at least mild to moderate mitral valve regurgitation, though severity may be underestimated due to jet eccentricity. No evidence of mitral valve stenosis. MV peak gradient, 12.5 mmHg. The mean mitral valve gradient is 4.0 mmHg. Tricuspid Valve: The tricuspid valve is normal in structure. Tricuspid valve regurgitation is moderate. Aortic Valve: The aortic valve is tricuspid. Aortic valve regurgitation is not visualized. No aortic stenosis is present. Aortic valve mean gradient measures 3.0 mmHg. Aortic valve peak gradient measures 5.7 mmHg. Aortic valve area, by VTI measures 1.99 cm. Pulmonic Valve: The pulmonic valve was not well visualized. Pulmonic valve regurgitation is not visualized. No evidence of pulmonic stenosis. Aorta: The aortic root is normal in size and structure. Venous: The inferior vena cava was not well visualized. IAS/Shunts: The interatrial septum was not well visualized.  LEFT VENTRICLE PLAX 2D LVIDd:         4.70 cm LVIDs:         2.90 cm LV PW:         0.79 cm LV IVS:        1.00 cm LVOT diam:     2.00 cm LV SV:         41 LV SV Index:   19 LVOT Area:     3.14  cm  RIGHT VENTRICLE RV Basal diam:  4.80 cm RV Mid diam:    5.00 cm RV S prime:     15.40 cm/s TAPSE (M-mode): 1.5 cm LEFT ATRIUM              Index        RIGHT ATRIUM           Index LA diam:        5.50 cm  2.57 cm/m   RA Area:     45.50 cm LA Vol (A2C):   160.0 ml 74.69 ml/m  RA Volume:   205.00 ml 95.69 ml/m LA Vol (A4C):   127.0 ml 59.28  ml/m LA Biplane Vol: 147.0 ml 68.62 ml/m  AORTIC VALVE                    PULMONIC VALVE AV Area (Vmax):    1.90 cm     PV Vmax:       0.80 m/s AV Area (Vmean):   1.79 cm     PV Peak grad:  2.5 mmHg AV Area (VTI):     1.99 cm AV Vmax:           119.00 cm/s AV Vmean:          72.300 cm/s AV VTI:            0.207 m AV Peak Grad:      5.7 mmHg AV Mean Grad:      3.0 mmHg LVOT Vmax:         71.90 cm/s LVOT Vmean:        41.150 cm/s LVOT VTI:          0.131 m LVOT/AV VTI ratio: 0.63  AORTA Ao Root diam: 3.30 cm MITRAL VALVE                TRICUSPID VALVE MV Area (PHT): 3.39 cm     TR Peak grad:   37.9 mmHg MV Area VTI:   1.52 cm     TR Vmax:        308.00 cm/s MV Peak grad:  12.5 mmHg MV Mean grad:  4.0 mmHg     SHUNTS MV Vmax:       1.77 m/s     Systemic VTI:  0.13 m MV Vmean:      93.2 cm/s    Systemic Diam: 2.00 cm MV Decel Time: 224 msec MV E velocity: 163.00 cm/s Yvonne Kendall MD Electronically signed by Yvonne Kendall MD Signature Date/Time: 09/16/2022/1:26:12 PM    Final    CT ABDOMEN PELVIS W CONTRAST  Result Date: 09/14/2022 CLINICAL DATA:  77 year old male with history of acute onset of nonlocalized abdominal pain. EXAM: CT ABDOMEN AND PELVIS WITH CONTRAST TECHNIQUE: Multidetector CT imaging of the abdomen and pelvis was performed using the standard protocol following bolus administration of intravenous contrast. RADIATION DOSE REDUCTION: This exam was performed according to the departmental dose-optimization program which includes automated exposure control, adjustment of the mA and/or kV according to patient size and/or use of iterative reconstruction  technique. CONTRAST:  OMNIPAQUE IOHEXOL 300 MG/ML  SOLN COMPARISON:  CT of the abdomen and pelvis 04/16/2022. FINDINGS: Lower chest: There appears to be a combination of atelectasis and consolidation in the right lower lobe. Elevation of the right hemidiaphragm. Atherosclerotic calcifications are noted in the left main, left anterior descending, left circumflex and right coronary arteries. Calcifications of the mitral annulus. MitraClip incidentally noted. Cardiomegaly with biatrial dilatation. Hepatobiliary: 2.1 x 1.5 cm low-attenuation lesion in segment 7 of the liver, similar to prior studies, compatible with a simple cyst (no imaging follow-up recommended). No other new suspicious appearing hepatic lesions are noted. Gallbladder is moderately distended. Noncalcified gallstone lying dependently in the gallbladder measuring 5 mm (axial image 31 of series 2). Gallbladder wall does not appear thickened. No pericholecystic fluid or surrounding inflammatory changes. Pancreas: No pancreatic mass. No pancreatic ductal dilatation. No pancreatic or peripancreatic fluid collections or inflammatory changes. Spleen: Unremarkable. Adrenals/Urinary Tract: Multiple low-attenuation lesions in both kidneys, many of which are too small to definitively characterize, but are statistically likely to represent small cysts (no imaging follow-up recommended).  The largest of these lesions is an exophytic simple cyst (Bosniak class 1, no imaging follow-up recommended) measuring 2.6 cm in the posterior aspect of the interpolar region of the left kidney. No hydroureteronephrosis. Urinary bladder is nearly completely decompressed with an indwelling suprapubic catheter in place. Bilateral adrenal glands are normal in appearance. Stomach/Bowel: The appearance of the stomach is normal. No pathologic dilatation of small bowel or colon. Large volume of well-formed stool again noted in the rectal vault. Gaseous distension of the sigmoid  colon proximal to this is noted, but remaining portions of the colon are otherwise relatively normal in caliber, although there is a relatively large volume of well-formed stool in the colon. The appendix is not confidently identified and may be surgically absent. Regardless, there are no inflammatory changes noted adjacent to the cecum to suggest the presence of an acute appendicitis at this time. Vascular/Lymphatic: Aortic atherosclerosis, without evidence of aneurysm or dissection in the abdominal or pelvic vasculature. No lymphadenopathy noted in the abdomen or pelvis. Reproductive: Prostate gland and seminal vesicles are obscured by beam hardening artifact from the patient's bilateral hip arthroplasties. Other: No significant volume of ascites.  No pneumoperitoneum. Musculoskeletal: Status post bilateral hip arthroplasty. There are no aggressive appearing lytic or blastic lesions noted in the visualized portions of the skeleton. IMPRESSION: 1. Moderate to large volume of stool in the colon and rectum suggesting constipation. No other definite acute findings confidently identified in the abdomen or pelvis. 2. Atelectasis and consolidation in the right lower lobe concerning for pneumonia. 3. Cholelithiasis without evidence of acute cholecystitis. 4. Cardiomegaly. 5. Aortic atherosclerosis, in addition to left main and three-vessel coronary artery disease. Assessment for potential risk factor modification, dietary therapy or pharmacologic therapy may be warranted, if clinically indicated. 6. Additional incidental findings, as above. Electronically Signed   By: Trudie Reed M.D.   On: 09/14/2022 13:51    Microbiology: Results for orders placed or performed during the hospital encounter of 09/14/22  Urine Culture     Status: Abnormal   Collection Time: 09/14/22 12:28 PM   Specimen: Urine, Random  Result Value Ref Range Status   Specimen Description   Final    URINE, RANDOM Performed at [facility], [address], [place], [place] [address]    Special Requests   Final    NONE Performed at [facility], [address] [place], [place] [address]    Culture (A)  Final    70,000 COLONIES/mL KLEBSIELLA PNEUMONIAE Confirmed Extended Spectrum Beta-Lactamase Producer (ESBL).  In bloodstream infections from ESBL organisms, carbapenems are preferred over piperacillin/tazobactam. They are shown to have a lower risk of mortality. 50,000 COLONIES/mL PROTEUS MIRABILIS    Report Status 09/16/2022 FINAL  Final   Organism ID, Bacteria KLEBSIELLA PNEUMONIAE (A)  Final   Organism ID, Bacteria PROTEUS MIRABILIS (A)  Final      Susceptibility   Klebsiella pneumoniae - MIC*    AMPICILLIN >=32 RESISTANT Resistant     CEFAZOLIN >=64 RESISTANT Resistant     CEFEPIME >=32 RESISTANT Resistant     CEFTRIAXONE >=64 RESISTANT Resistant     CIPROFLOXACIN <=0.25 SENSITIVE Sensitive     GENTAMICIN <=1 SENSITIVE Sensitive     IMIPENEM <=0.25 SENSITIVE Sensitive     NITROFURANTOIN 64 INTERMEDIATE Intermediate     TRIMETH/SULFA >=320 RESISTANT Resistant     AMPICILLIN/SULBACTAM >=32 RESISTANT Resistant     PIP/TAZO 16 SENSITIVE Sensitive     * 70,000 COLONIES/mL KLEBSIELLA PNEUMONIAE   Proteus  mirabilis - MIC*    AMPICILLIN 4 SENSITIVE Sensitive     CEFAZOLIN 8 SENSITIVE Sensitive     CEFEPIME <=0.12 SENSITIVE Sensitive     CEFTRIAXONE <=0.25 SENSITIVE Sensitive     CIPROFLOXACIN >=4 RESISTANT Resistant     GENTAMICIN <=1 SENSITIVE Sensitive     IMIPENEM 8 INTERMEDIATE Intermediate     NITROFURANTOIN 128 RESISTANT Resistant     TRIMETH/SULFA <=20 SENSITIVE Sensitive     AMPICILLIN/SULBACTAM 4 SENSITIVE Sensitive     PIP/TAZO <=4 SENSITIVE Sensitive     * 50,000 COLONIES/mL PROTEUS MIRABILIS  Blood culture (routine x 2)     Status: None (Preliminary result)   Collection Time: 09/14/22 12:37 PM   Specimen: BLOOD  Result Value Ref Range Status   Specimen Description BLOOD BLOOD  LEFT FOREARM  Final   Special Requests   Final    BOTTLES DRAWN AEROBIC AND ANAEROBIC Blood Culture adequate volume   Culture   Final    NO GROWTH 2 DAYS Performed at [facility], [address] [place], [place] [address]    Report Status PENDING  Incomplete  Blood culture (routine x 2)     Status: None (Preliminary result)   Collection Time: 09/14/22 12:40 PM   Specimen: BLOOD  Result Value Ref Range Status   Specimen Description BLOOD BLOOD RIGHT FOREARM  Final   Special Requests   Final    BOTTLES DRAWN AEROBIC AND ANAEROBIC Blood Culture adequate volume   Culture   Final    NO GROWTH 2 DAYS Performed at [facility], [address] [place], [place] [address]    Report Status PENDING  Incomplete  Expectorated Sputum Assessment w Gram Stain, Rflx to Resp Cult     Status: None   Collection Time: 09/14/22  7:00 PM   Specimen: Sputum  Result Value Ref Range Status   Specimen Description SPUTUM  Final   Special Requests NONE  Final   Sputum evaluation   Final    Sputum specimen not acceptable for testing.  Please recollect.   Performed at [facility], [address] [place], [place] [address]    Report Status 09/14/2022 FINAL  Final  Expectorated Sputum Assessment w Gram Stain, Rflx to Resp Cult     Status: None   Collection Time: 09/15/22 10:45 AM   Specimen: Sputum  Result Value Ref Range Status   Specimen Description SPUTUM  Final   Special Requests NONE  Final   Sputum evaluation   Final    THIS SPECIMEN IS ACCEPTABLE FOR SPUTUM CULTURE Performed at [facility], [address], [place], [place] [address]    Report Status 09/15/2022 FINAL  Final  Culture, Respiratory w Gram Stain     Status: None (Preliminary result)   Collection Time: 09/15/22 10:45 AM   Specimen: SPU  Result Value Ref Range Status   Specimen Description   Final    SPUTUM Performed at [facility], [address] [place], [place] [address]     Special Requests   Final    NONE Reflexed from [id number] Performed at [facility], [address] [place], [place] [address]    Gram Stain   Final    FEW SQUAMOUS EPITHELIAL CELLS PRESENT FEW WBC PRESENT, PREDOMINANTLY PMN RARE GRAM POSITIVE COCCI RARE GRAM POSITIVE RODS    Culture   Final    CULTURE REINCUBATED FOR BETTER GROWTH Performed at [facility], [address] [place],  [place] [address]    Report Status PENDING  Incomplete    Labs: CBC: Recent Labs  Lab 09/14/22 1137 09/15/22 0413  WBC 13.8* 8.3  HGB 11.6* 11.2*  HCT 36.6* 34.8*  MCV 89.9 89.2  PLT 245 198   Basic Metabolic Panel: Recent Labs  Lab 09/14/22 1137 09/15/22 0413 09/16/22 0406  NA 135 137 141  K 3.7 3.7 3.8  CL 105 109 110  CO2 21* 20* 21*  GLUCOSE 116* 161* 162*  BUN 29* 24* 34*  CREATININE 1.07 0.86 0.88  CALCIUM 8.7* 8.7* 8.9  MG 1.8  --  2.1   Liver Function Tests: No results for input(s): "AST", "ALT", "ALKPHOS", "BILITOT", "PROT", "ALBUMIN" in the last 168 hours. CBG: Recent Labs  Lab 09/14/22 1225 09/14/22 1853 09/14/22 2106 09/15/22 0715  GLUCAP 107* 114* 134* 145*    Discharge time spent: greater than 30 minutes.  Signed: Delfino Lovett, MD [facility] 09/16/2022

## 2022-09-16 NOTE — TOC Progression Note (Signed)
Transition of Care [facility]) - Progression Note    Patient Details  Name: Gaspar Cusano MRN: [id number] Date of Birth: May 04, 1945  [facility] CM/SW Contact  Chapman Fitch, RN Phone Number: 09/16/2022, 11:05 AM  Clinical Narrative:     Stanton Kidney at [facility] confirms that [facility] Patient confirms plan to return at discharge Fl2 sent for signature   Expected Discharge Plan: Skilled Nursing Facility    Expected Discharge Plan and Services                                               Social Determinants of Health (SDOH) Interventions SDOH Screenings   Food Insecurity: No Food Insecurity (09/14/2022)  Housing: Low Risk  (09/14/2022)  Transportation Needs: No Transportation Needs (09/14/2022)  Utilities: Not At Risk (09/14/2022)  Depression (PHQ2-9): Low Risk  (08/05/2022)  Tobacco Use: Medium Risk (09/15/2022)    Readmission Risk Interventions    01/12/2021    1:08 PM 10/20/2020    8:59 AM  Readmission Risk Prevention Plan  Transportation Screening Complete Complete  PCP or Specialist Appt within 3-5 Days  Complete  HRI or Home Care Consult  Complete  Palliative Care Screening  Not Applicable  Medication Review (RN Care Manager) Complete Referral to Pharmacy  PCP or Specialist appointment within 3-5 days of discharge Complete   HRI or Home Care Consult Complete   SW Recovery Care/Counseling Consult Complete   Palliative Care Screening Not Applicable   Skilled Nursing Facility Complete

## 2022-09-16 NOTE — Progress Notes (Signed)
Progress Note   Patient: Raymond Hays ZOX:[id number] DOB: [birth year] DOA: 09/14/2022     2 DOS: the patient was seen and examined on 09/16/2022   Brief hospital course: Raymond Hays is a 77 y.o. male with medical history significant of chronic indwelling Foley catheter placement, neurogenic bladder with suprapubic catheter., A-fib on Eliquis, ESBL UTI, spinal stenosis, wheelchair bound, Parkinson's disease, hypertension, diet controled DM, COPD, hypothyroidism, gout, depression with anxiety, BPH, anemia, who presents with generalized weakness.  He was hypotensive on arrival, received IV fluid bolus.  CT scan showed large amount of stool in the colon, right lower lobe consolidation concerning for pneumonia.  Patient also has abnormal urine, urine culture sent out.  Patient was placed on meropenem for complicated urinary tract infection.  6/17: Suprapubic catheter replaced.  Waiting for ID input to decide need for IV antibiotics at discharge for ESBL E. coli sepsis  Assessment and Plan:  Complicated UTI (urinary tract infection) secondary to suprapubic catheter. Suprapubic catheter. Severe sepsis secondary to UTI versus other infection source/lung. Patient met septic criteria, he also has significant hypotension.  Met severe sepsis criteria on admission. He also has a chronic indwelling suprapubic catheter, discussed with the nurse, it is replaced today Urine culture is growing ESBL, continue meropenem for now.  Will consult ID as I am not sure if he truly has urine infection or is this just a colonization.   COPD exacerbation (HCC)  Right lower lobe aspiration pneumonia. Patient no longer has any bronchospasm.  I reviewed patient CT scan, patient does has a right lower lobe consolidation.  Patient also had a significant choking when eating, high risk for aspiration.  Procalcitonin level less than 0.1, this is most likely aspiration pneumonia.  Mostly chemical pneumonia, patient is covered with  meropenem, no need for doxycycline. Patient also has a mild elevation of BNP, not grossly volume overloaded.  Repeat echocardiogram is pending.   AF (paroxysmal atrial fibrillation) (HCC): HR 80s. -Eliquis   Essential hypertension:  Patient was hypotensive at time of admission.  Hold off most blood pressure medicines.   Parkinson disease -Sinemet -Fall precaution   Hypothyroidism -Synthroid   Prolonged QT interval: QTc 658 on admission Monitor   Depression with anxiety -Hold Celexa due to QTc prolonged patient   Obesity (BMI 30-39.9): Body weight well 5.2 kg, BMI 36.32 -Healthy diet and exercise -Encourage losing weight   Severe debility. Patient is wheelchair-bound at baseline.      Subjective: Patient is wanting to go back to his facility.  Feeling much better.  Agreeable to get his suprapubic catheter change as he shares that that was too long back but has not been done at the facility  Physical Exam: Vitals:   09/15/22 0818 09/15/22 1700 09/15/22 1712 09/16/22 0811  BP: (!) 133/96  (!) 133/93 (!) 145/109  Pulse: 84  87 96  Resp: 18  18 18   Temp: 98.1 F (36.7 C) 98 F (36.7 C) 98 F (36.7 C) 98 F (36.7 C)  TempSrc: Oral  Oral Oral  SpO2: 99%  98% 99%  Weight:      Height:       General exam: Appears calm and comfortable  Respiratory system: Clear to auscultation bilaterally. Respiratory effort normal. Cardiovascular system: S1 & S2 heard, RRR. No JVD, murmurs, rubs, gallops or clicks. No pedal edema. Gastrointestinal system: Abdomen is nondistended, soft and nontender. No organomegaly or masses felt. Normal bowel sounds heard. Central nervous system: Alert and oriented x2. No focal neurological  deficits. Extremities: Symmetric 5 x 5 power. Skin: No rashes, lesions or ulcers Psychiatry:  Mood & affect appropriate.  Data Reviewed:  Urine culture growing ESBL E. coli  Family Communication: None at bedside  Disposition: Status is: Inpatient Remains  inpatient appropriate because: Management of UTI/sepsis  Planned Discharge Destination:  Long-term care/[facility]   DVT prophylaxis-Eliquis Time spent: 35 minutes  Author: Delfino Lovett, MD 09/16/2022 12:31 PM  For on call review [email or web address].

## 2022-09-16 NOTE — Progress Notes (Signed)
*  PRELIMINARY RESULTS* Echocardiogram 2D Echocardiogram has been performed.  Raymond Hays 09/16/2022, 11:15 AM

## 2022-09-16 NOTE — Progress Notes (Addendum)
Speech Language Pathology Treatment: Dysphagia  Patient Details Name: Raymond Hays MRN: [id number] DOB: [birth year] Today's Date: 09/16/2022 Time: 4098-1191 SLP Time Calculation (min) (ACUTE ONLY): 40 min  Assessment / Plan / Recommendation Clinical Impression  Pt seen for ongoing assessment of swallowing and toleration of oral diet -- pt is currently on a mech soft diet consistency for ease of chewing soft/cooked foods and cut meats (pt is Missing Dentition). Pt was alert/awake and talkative - even while eating which was discouraged. Pt was finishing his Lunch meal -- no difficulty w/ this meal or breakfast meal reported by NSG staff.  Pt on RA, afebrile. WBC WNL.  Pt appears to present w/ functional oropharyngeal phase swallowing w/ no overt oropharyngeal phase dysphagia noted, no neuromuscular deficits noted. Pt is Missing Dentition which can impact effective mastication of tougher solids(meats). Pt consumed po trials w/ No immediate, overt, clinical s/s of aspiration during po trials.  Pt appears at reduced risk for aspiration following general aspiration precautions. Per chart review, pt does not have consistent Imaging to support the picture of chronic aspiration/aspiration pneumonia. He does have h/o many abdominal studies as well as candidiasis throughout the entire Esophagus during EGD in 2022.  Pt admitted he was coughing when eating and talking w/ "the other day". he also admitted that he has "some trouble sometimes" w/ Particulate foods: corn, rice, peas(skins) at baseline.   However, pt does have challenging factors that could impact oropharyngeal swallowing to include more w/c bound/bedbound status, deconditioning/weakness, and parkinson's dis., and GI dysmotility(see multiple abdominal studies). These factors can increase risk for dysphagia, aspiration as well as decreased oral intake overall.   During po trials, pt consumed all consistencies w/ no overt coughing, decline in vocal  quality, or change in respiratory presentation during/post trials. Oral phase appeared grossly [facility] w/ timely bolus management, mastication, and control of bolus propulsion for A-P transfer for swallowing. Oral clearing achieved w/ all trial consistencies -- moistened, soft foods given. Pt fed self w/ min setup support.   Recommend continue a more mech soft consistency diet w/ well-Cut meats, moistened foods; Thin liquids -- monitor straw use when drinking in bed. Recommend general aspiration precautions, REFLUX precautions. Tray setup and help sitting fully upright when eating/drinking in bed. Pills WHOLE in Puree for safer, easier swallowing if desired.  Education given on Pills in Puree; food consistencies and easy to eat options; particulate foods and prep or avoidance of them; general aspiration and REFLUX precautions to pt.  NSG to reconsult if any new needs arise during admit. MD/NSG updated, agreed. Recommend Dietician f/u for support.       HPI HPI: Pt is 77 y.o. male who presents to ED with c/o generalized weakness from his NH where he resides. Pt with medical history significant of Obesity, chronic indwelling Foley catheter placement, neurogenic bladder, A-fib on Eliquis, ESBL UTI, spinal stenosis, wheelchair bound, Parkinson's disease, hypertension, diet controled DM, COPD, hypothyroidism, gout, depression with anxiety, BPH, anemia.  CT abdomen pelvis "1. Moderate to large volume of stool in the colon and rectum  suggesting constipation. No other definite acute findings  confidently identified in the abdomen or pelvis.  2. Atelectasis and consolidation in the right lower lobe concerning  for pneumonia.  3. Cholelithiasis without evidence of acute cholecystitis. 4. Cardiomegaly.  5. Aortic atherosclerosis, in addition to left main and three-vessel  coronary artery disease.".   Abd Study revealed:  "Moderate to large volume of stool in the colon and rectum  suggesting constipation.".  This has  been prevalent in pt's Abd. Imaging history: "Large amount of retained stool within the rectal vault consistent  with fecal impaction. Marked gaseous distention of the colon; Lower chest: Bibasilar hypoventilatory changes. No acute airspace  disease or effusion.".   Last CXR was 04/2021: "Pulmonary hypoinflation.".      SLP Plan  All goals met      Recommendations for follow up therapy are one component of a multi-disciplinary discharge planning process, led by the attending physician.  Recommendations may be updated based on patient status, additional functional criteria and insurance authorization.    Recommendations  Diet recommendations: Dysphagia 3 (mechanical soft);Thin liquid (for the cut meats and moistened foods) Liquids provided via: Cup;Straw Medication Administration: Whole meds with puree (if needed for ease of swallowing) Supervision: Patient able to self feed (setup support) Compensations: Minimize environmental distractions;Slow rate;Small sips/bites;Lingual sweep for clearance of pocketing;Follow solids with liquid (Rest Breaks during meals as needed) Postural Changes and/or Swallow Maneuvers: Out of bed for meals;Seated upright 90 degrees;Upright 30-60 min after meal                 (Dietician f/u) Oral care BID;Patient independent with oral care   Set up Supervision/Assistance Dysphagia, unspecified (R13.10)     All goals met        Raymond Som, MS, CCC-SLP Speech Language Pathologist Rehab Services; [facility] [phone] (ascom) Raymond Hays  09/16/2022, 4:11 PM

## 2022-09-16 NOTE — Consult Note (Addendum)
NAME: Raymond Hays  DOB: 04-Dec-1945  MRN: [id number]  Date/Time: 09/16/2022 6:55 PM  REQUESTING PROVIDER" Dr.Shah Subjective:  REASON FOR CONSULT: ESBL UTI ? Raymond Hays is a 77 y.o. 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  BP was 75/45 taken by NH staff but when EMS arrived it was fine Pt says he was feeling week He did not have any fever or chills or abdominal pain or diarrhea  In the ED vitals  09/14/22 11:25  BP 89/62 (L)  Temp 98.3 F (36.8 C)  Pulse Rate 84  Resp 18  SpO2 96 %  And then improved to 142/77 Labs  Latest Reference Range & Units 09/14/22 11:37  WBC 4.0 - 10.5 K/uL 13.8 (H)  Hemoglobin 13.0 - 17.0 g/dL 47.4 (L)  HCT 25.9 - 56.3 % 36.6 (L)  Platelets 150 - 400 K/uL 245  Creatinine 0.61 - 1.24 mg/dL 8.75   UA wbc of >64 Ct abd/pelvis Moderate to large stool in colon and rectum Atelectasis /consolidation rt lower lobe Cholelithiasis Cardiomegaly  Blood culture and Urine culture sent Because of h/o ESBl organisms  in urine before he was started to Meropenem for Sepsis    Past Medical History:  Diagnosis Date   Allergic rhinitis    Anxiety    Arthritis    Atrial fibrillation (HCC)    Chronic indwelling Foley catheter    COPD (chronic obstructive pulmonary disease) (HCC)    Cough    Diabetes (HCC)    Essential (primary) hypertension    Hemorrhoid    Hyperlipidemia    PAF (paroxysmal atrial fibrillation) (HCC)    Primary osteoarthritis, unspecified shoulder    Retention of urine, unspecified    Spinal stenosis    UTI (urinary tract infection)      Past Surgical History:  Procedure Laterality Date   APPENDECTOMY     BACK SURGERY     COLONOSCOPY N/A 10/27/2020   Procedure: COLONOSCOPY;  Surgeon: Wyline Mood, MD;  Location: [facility] ENDOSCOPY;  Service: Gastroenterology;  Laterality: N/A;   ELBOW SURGERY     ESOPHAGOGASTRODUODENOSCOPY (EGD) WITH PROPOFOL N/A 10/27/2020   Procedure: ESOPHAGOGASTRODUODENOSCOPY (EGD) WITH PROPOFOL;  Surgeon: Wyline Mood, MD;  Location: [facility] ENDOSCOPY;  Service: Gastroenterology;  Laterality: N/A;   HERNIA REPAIR     KNEE SURGERY     neck     PARTIAL HIP ARTHROPLASTY Bilateral    TEE WITHOUT CARDIOVERSION N/A 10/19/2020   Procedure: TRANSESOPHAGEAL ECHOCARDIOGRAM (TEE);  Surgeon: Antonieta Iba, MD;  Location: [facility] ORS;  Service: Cardiovascular;  Laterality: N/A;   TONSILLECTOMY      Social History   Socioeconomic History   Marital status: Divorced    Spouse name: Not on file   Number of children: Not on file   Years of education: Not on file   Highest education level: Not on file  Occupational History   Occupation: retired    Comment: retired Hotel manager x 4 years; Scientist, physiological; Arboriculturist as Academic librarian  Tobacco Use   Smoking status: Former    Packs/day: 0.50    Years: 10.00    Additional pack years: 0.00  Total pack years: 5.00    Types: Cigarettes    Quit date: 2006    Years since quitting: 18.4   Smokeless tobacco: Never  Vaping Use   Vaping Use: Never used  Substance and Sexual Activity   Alcohol use: Not Currently   Drug use: Not Currently    Types: "Crack" cocaine    Comment: quit "crack" 15 years ago--11/04/2019   Sexual activity: Not on file  Other Topics Concern   Not on file  Social History Narrative   Not on file   Social Determinants of Health   Financial Resource Strain: Not on file  Food Insecurity: No Food Insecurity (09/14/2022)   Hunger Vital Sign    Worried About Running Out of Food in the Last Year: Never true    Ran Out of Food in the Last Year: Never true   Transportation Needs: No Transportation Needs (09/14/2022)   PRAPARE - Administrator, Civil Service (Medical): No    Lack of Transportation (Non-Medical): No  Physical Activity: Not on file  Stress: Not on file  Social Connections: Not on file  Intimate Partner Violence: Not At Risk (09/14/2022)   Humiliation, Afraid, Rape, and Kick questionnaire    Fear of Current or Ex-Partner: No    Emotionally Abused: No    Physically Abused: No    Sexually Abused: No    Family History  Problem Relation Age of Onset   Healthy Son    Healthy Son    No Known Allergies I? Current Facility-Administered Medications  Medication Dose Route Frequency Provider Last Rate Last Admin   acetaminophen (TYLENOL) tablet 650 mg  650 mg Oral Q6H PRN Lorretta Harp, MD       albuterol (PROVENTIL) (2.5 MG/3ML) 0.083% nebulizer solution 3 mL  3 mL Inhalation Q4H PRN Lorretta Harp, MD       allopurinol (ZYLOPRIM) tablet 100 mg  100 mg Oral Daily Lorretta Harp, MD   100 mg at 09/16/22 1202   apixaban (ELIQUIS) tablet 5 mg  5 mg Oral BID Lorretta Harp, MD   5 mg at 09/16/22 1202   atorvastatin (LIPITOR) tablet 20 mg  20 mg Oral QHS Lorretta Harp, MD   20 mg at 09/15/22 2208   carbidopa-levodopa (SINEMET IR) 25-100 MG per tablet immediate release 1 tablet  1 tablet Oral TID Langley Gauss, MD   1 tablet at 09/16/22 1717   cholecalciferol (VITAMIN D3) tablet 1,000 Units  1,000 Units Oral Daily Lorretta Harp, MD   1,000 Units at 09/16/22 1202   dextromethorphan-guaiFENesin (MUCINEX DM) 30-600 MG per 12 hr tablet 1 tablet  1 tablet Oral BID PRN Lorretta Harp, MD   1 tablet at 09/15/22 1258   diphenhydrAMINE (BENADRYL) injection 12.5 mg  12.5 mg Intravenous Q8H PRN Lorretta Harp, MD   12.5 mg at 09/14/22 2351   fluticasone (FLONASE) 50 MCG/ACT nasal spray 2 spray  2 spray Each Nare Daily Lorretta Harp, MD   2 spray at 09/16/22 1207   gabapentin (NEURONTIN) capsule 800 mg  800 mg Oral BID Lorretta Harp, MD   800 mg at 09/16/22 1201    hydrALAZINE (APRESOLINE) injection 5 mg  5 mg Intravenous Q2H PRN Lorretta Harp, MD       hydrocortisone (ANUSOL-HC) 2.5 % rectal cream   Topical BID Delfino Lovett, MD   Given at 09/16/22 1211   ipratropium-albuterol (DUONEB) 0.5-2.5 (3) MG/3ML nebulizer solution 3 mL  3 mL Nebulization Q4H PRN Marrion Coy, MD  levothyroxine (SYNTHROID) tablet 75 mcg  75 mcg Oral QHS Lorretta Harp, MD   75 mcg at 09/16/22 0539   melatonin tablet 10 mg  10 mg Oral QHS Lorretta Harp, MD   10 mg at 09/15/22 2208   meropenem (MERREM) 1 g in sodium chloride 0.9 % 100 mL IVPB  1 g Intravenous Q8H Dorothea Ogle B, RPH 200 mL/hr at 09/16/22 1719 1 g at 09/16/22 1719   methocarbamol (ROBAXIN) tablet 1,000 mg  1,000 mg Oral BID Lorretta Harp, MD   1,000 mg at 09/16/22 1202   oxybutynin (DITROPAN) tablet 5 mg  5 mg Oral BID Lorretta Harp, MD   5 mg at 09/16/22 1202   pantoprazole (PROTONIX) EC tablet 40 mg  40 mg Oral Daily Lorretta Harp, MD   40 mg at 09/16/22 1202   polyethylene glycol (MIRALAX / GLYCOLAX) packet 17 g  17 g Oral Daily PRN Lorretta Harp, MD       senna-docusate (Senokot-S) tablet 2 tablet  2 tablet Oral BID Marrion Coy, MD   2 tablet at 09/15/22 2208   silver sulfADIAZINE (SILVADENE) 1 % cream 1 Application  1 Application Topical Daily Lorretta Harp, MD   1 Application at 09/16/22 1211   sodium bicarbonate tablet 650 mg  650 mg Oral BID Marrion Coy, MD   650 mg at 09/16/22 1202   terbinafine (LAMISIL) 1 % cream 1 Application  1 Application Topical QHS Lorretta Harp, MD   1 Application at 09/15/22 2209   triamcinolone cream (KENALOG) 0.5 %   Topical BID PRN Lorretta Harp, MD   Given at 09/16/22 1214   umeclidinium bromide (INCRUSE ELLIPTA) 62.5 MCG/ACT 1 puff  1 puff Inhalation Daily Lorretta Harp, MD   1 puff at 09/16/22 1208     Abtx:  Anti-infectives (From admission, onward)    Start     Dose/Rate Route Frequency Ordered Stop   09/14/22 1600  meropenem (MERREM) 1 g in sodium chloride 0.9 % 100 mL IVPB        1 g 200 mL/hr  over 30 Minutes Intravenous Every 8 hours 09/14/22 1451     09/14/22 1600  doxycycline (VIBRAMYCIN) 100 mg in sodium chloride 0.9 % 250 mL IVPB  Status:  Discontinued        100 mg 125 mL/hr over 120 Minutes Intravenous Every 12 hours 09/14/22 1533 09/15/22 1250   09/14/22 1530  doxycycline (VIBRAMYCIN) 200 mg in dextrose 5 % 250 mL IVPB  Status:  Discontinued        200 mg 125 mL/hr over 120 Minutes Intravenous Every 12 hours 09/14/22 1524 09/14/22 1532   09/14/22 1415  azithromycin (ZITHROMAX) 500 mg in sodium chloride 0.9 % 250 mL IVPB  Status:  Discontinued        500 mg 250 mL/hr over 60 Minutes Intravenous  Once 09/14/22 1405 09/14/22 1523   09/14/22 1315  ceFEPIme (MAXIPIME) 2 g in sodium chloride 0.9 % 100 mL IVPB        2 g 200 mL/hr over 30 Minutes Intravenous  Once 09/14/22 1301 09/14/22 1404       REVIEW OF SYSTEMS:  Const: negative fever, negative chills, negative weight loss Eyes:poor vision ENT: negative coryza, negative sore throat Resp:  cough, clear sputum and wheezing Cards: negative for chest pain, palpitations, lower extremity edema GU: SPC cath, also leakign from urine GI: Negative for abdominal pain, diarrhea, bleeding, constipation Skin: negative for rash and pruritus Heme: negative for easy bruising and  gum/nose bleeding MS: unable to walk Wheel chair bound Neurolo:negative for headaches, dizziness, vertigo, memory problems  Psych: negative for feelings of anxiety, depression  Endocrine: negative for thyroid, diabetes Allergy/Immunology- negative for any medication or food allergies ?  Objective:  VITALS:  BP (!) 138/98   Pulse 88   Temp (!) 97.5 F (36.4 C) (Oral)   Resp 18   Ht 5\' 7"  (1.702 m)   Wt 103.9 kg   SpO2 92%   BMI 35.88 kg/m   PHYSICAL EXAM:  General: Alert, cooperative, no distress, appears stated age.  Head: Normocephalic, without obvious abnormality, atraumatic. Eyes: Conjunctivae clear, anicteric sclerae. Pupils are  equal ENT Nares normal. No drainage or sinus tenderness. Lips, mucosa, and tongue normal. No Thrush Neck:  symmetrical, no adenopathy, thyroid: non tender no carotid bruit and no JVD. Lungs: b/l rhonchi Heart: Regular rate and rhythm, no murmur, rub or gallop. Abdomen: Soft, non-tender,not distended. Bowel sounds normal. No masses Spc  Extremities: atraumatic, no cyanosis. No edema. No clubbing Skin: No rashes or lesions. Or bruising Lymph: Cervical, supraclavicular normal. Neurologic: some weakness Pertinent Labs Lab Results CBC    Component Value Date/Time   WBC 8.3 09/15/2022 0413   RBC 3.90 (L) 09/15/2022 0413   HGB 11.2 (L) 09/15/2022 0413   HCT 34.8 (L) 09/15/2022 0413   PLT 198 09/15/2022 0413   MCV 89.2 09/15/2022 0413   MCH 28.7 09/15/2022 0413   MCHC 32.2 09/15/2022 0413   RDW 14.6 09/15/2022 0413   LYMPHSABS 1.5 12/05/2021 1548   MONOABS 0.6 12/05/2021 1548   EOSABS 0.3 12/05/2021 1548   BASOSABS 0.1 12/05/2021 1548       Latest Ref Rng & Units 09/16/2022    4:06 AM 09/15/2022    4:13 AM 09/14/2022   11:37 AM  CMP  Glucose 70 - 99 mg/dL 045  409  811   BUN 8 - 23 mg/dL 34  24  29   Creatinine 0.61 - 1.24 mg/dL 9.14  7.82  9.56   Sodium 135 - 145 mmol/L 141  137  135   Potassium 3.5 - 5.1 mmol/L 3.8  3.7  3.7   Chloride 98 - 111 mmol/L 110  109  105   CO2 22 - 32 mmol/L 21  20  21    Calcium 8.9 - 10.3 mg/dL 8.9  8.7  8.7       Microbiology: Recent Results (from the past 240 hour(s))  Urine Culture     Status: Abnormal   Collection Time: 09/14/22 12:28 PM   Specimen: Urine, Random  Result Value Ref Range Status   Specimen Description   Final    URINE, RANDOM Performed at [facility], [address] [place], [place] [address]    Special Requests   Final    NONE Performed at [facility], [address] [place], [place] [address]    Culture (A)  Final    70,000 COLONIES/mL KLEBSIELLA PNEUMONIAE Confirmed Extended Spectrum  Beta-Lactamase Producer (ESBL).  In bloodstream infections from ESBL organisms, carbapenems are preferred over piperacillin/tazobactam. They are shown to have a lower risk of mortality. 50,000 COLONIES/mL PROTEUS MIRABILIS    Report Status 09/16/2022 FINAL  Final   Organism ID, Bacteria KLEBSIELLA PNEUMONIAE (A)  Final   Organism ID, Bacteria PROTEUS MIRABILIS (A)  Final      Susceptibility   Klebsiella pneumoniae - MIC*    AMPICILLIN >=32 RESISTANT Resistant     CEFAZOLIN >=64 RESISTANT Resistant  CEFEPIME >=32 RESISTANT Resistant     CEFTRIAXONE >=64 RESISTANT Resistant     CIPROFLOXACIN <=0.25 SENSITIVE Sensitive     GENTAMICIN <=1 SENSITIVE Sensitive     IMIPENEM <=0.25 SENSITIVE Sensitive     NITROFURANTOIN 64 INTERMEDIATE Intermediate     TRIMETH/SULFA >=320 RESISTANT Resistant     AMPICILLIN/SULBACTAM >=32 RESISTANT Resistant     PIP/TAZO 16 SENSITIVE Sensitive     * 70,000 COLONIES/mL KLEBSIELLA PNEUMONIAE   Proteus mirabilis - MIC*    AMPICILLIN 4 SENSITIVE Sensitive     CEFAZOLIN 8 SENSITIVE Sensitive     CEFEPIME <=0.12 SENSITIVE Sensitive     CEFTRIAXONE <=0.25 SENSITIVE Sensitive     CIPROFLOXACIN >=4 RESISTANT Resistant     GENTAMICIN <=1 SENSITIVE Sensitive     IMIPENEM 8 INTERMEDIATE Intermediate     NITROFURANTOIN 128 RESISTANT Resistant     TRIMETH/SULFA <=20 SENSITIVE Sensitive     AMPICILLIN/SULBACTAM 4 SENSITIVE Sensitive     PIP/TAZO <=4 SENSITIVE Sensitive     * 50,000 COLONIES/mL PROTEUS MIRABILIS  Blood culture (routine x 2)     Status: None (Preliminary result)   Collection Time: 09/14/22 12:37 PM   Specimen: BLOOD  Result Value Ref Range Status   Specimen Description BLOOD BLOOD LEFT FOREARM  Final   Special Requests   Final    BOTTLES DRAWN AEROBIC AND ANAEROBIC Blood Culture adequate volume   Culture   Final    NO GROWTH 2 DAYS Performed at [facility], [address] [place], [place] [address]    Report Status PENDING   Incomplete  Blood culture (routine x 2)     Status: None (Preliminary result)   Collection Time: 09/14/22 12:40 PM   Specimen: BLOOD  Result Value Ref Range Status   Specimen Description BLOOD BLOOD RIGHT FOREARM  Final   Special Requests   Final    BOTTLES DRAWN AEROBIC AND ANAEROBIC Blood Culture adequate volume   Culture   Final    NO GROWTH 2 DAYS Performed at [facility], [address] [place], [place] [address]    Report Status PENDING  Incomplete  Expectorated Sputum Assessment w Gram Stain, Rflx to Resp Cult     Status: None   Collection Time: 09/14/22  7:00 PM   Specimen: Sputum  Result Value Ref Range Status   Specimen Description SPUTUM  Final   Special Requests NONE  Final   Sputum evaluation   Final    Sputum specimen not acceptable for testing.  Please recollect.   Performed at [facility], [address] [place], [place] [address]    Report Status 09/14/2022 FINAL  Final  Expectorated Sputum Assessment w Gram Stain, Rflx to Resp Cult     Status: None   Collection Time: 09/15/22 10:45 AM   Specimen: Sputum  Result Value Ref Range Status   Specimen Description SPUTUM  Final   Special Requests NONE  Final   Sputum evaluation   Final    THIS SPECIMEN IS ACCEPTABLE FOR SPUTUM CULTURE Performed at [facility], [address], [place], [place] [address]    Report Status 09/15/2022 FINAL  Final  Culture, Respiratory w Gram Stain     Status: None (Preliminary result)   Collection Time: 09/15/22 10:45 AM   Specimen: SPU  Result Value Ref Range Status   Specimen Description   Final    SPUTUM Performed at [facility], [address] [place], [place] [address]  Special Requests   Final    NONE Reflexed from [id number] Performed at [facility], [address] [place], [place] [address]    Gram Stain   Final    FEW SQUAMOUS EPITHELIAL CELLS PRESENT FEW WBC PRESENT, PREDOMINANTLY PMN RARE GRAM POSITIVE  COCCI RARE GRAM POSITIVE RODS    Culture   Final    CULTURE REINCUBATED FOR BETTER GROWTH Performed at [facility], [address] [place], [place] [address]    Report Status PENDING  Incomplete    IMAGING RESULTS: Ctabdomen- colon stool I have personally reviewed the films ? Impression/Recommendation Hypotension with weakness on presentation No fever , no leucocytosis, no lactate . 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 ? ___________________________________________________ Discussed with patient, requesting provider Note:  This document was prepared using Dragon voice recognition software and may include unintentional dictation errors.

## 2022-09-17 ENCOUNTER — Inpatient Hospital Stay: Payer: No Typology Code available for payment source

## 2022-09-17 LAB — CBC
HCT: 34.9 % — ABNORMAL LOW (ref 39.0–52.0)
Hemoglobin: 11.1 g/dL — ABNORMAL LOW (ref 13.0–17.0)
MCH: 28.6 pg (ref 26.0–34.0)
MCHC: 31.8 g/dL (ref 30.0–36.0)
MCV: 89.9 fL (ref 80.0–100.0)
Platelets: 274 10*3/uL (ref 150–400)
RBC: 3.88 MIL/uL — ABNORMAL LOW (ref 4.22–5.81)
RDW: 15 % (ref 11.5–15.5)
WBC: 10.4 10*3/uL (ref 4.0–10.5)
nRBC: 0 % (ref 0.0–0.2)

## 2022-09-17 LAB — BASIC METABOLIC PANEL
Anion gap: 7 (ref 5–15)
BUN: 33 mg/dL — ABNORMAL HIGH (ref 8–23)
CO2: 24 mmol/L (ref 22–32)
Calcium: 9 mg/dL (ref 8.9–10.3)
Chloride: 109 mmol/L (ref 98–111)
Creatinine, Ser: 0.89 mg/dL (ref 0.61–1.24)
GFR, Estimated: >60 mL/min (ref >60)
Glucose, Bld: 115 mg/dL — ABNORMAL HIGH (ref 70–99)
Potassium: 3.7 mmol/L (ref 3.5–5.1)
Sodium: 140 mmol/L (ref 135–145)

## 2022-09-17 LAB — RESPIRATORY PANEL BY PCR

## 2022-09-17 LAB — CULTURE, BLOOD (ROUTINE X 2): Special Requests: ADEQUATE

## 2022-09-17 LAB — CORTISOL-AM, BLOOD: Cortisol - AM: 3.7 ug/dL — ABNORMAL LOW (ref 6.7–22.6)

## 2022-09-17 LAB — LEGIONELLA PNEUMOPHILA SEROGP 1 UR AG: L. pneumophila Serogp 1 Ur Ag: NEGATIVE

## 2022-09-17 NOTE — Plan of Care (Signed)
  Problem: Activity: Goal: Ability to tolerate increased activity will improve Outcome: Progressing   Problem: Clinical Measurements: Goal: Ability to maintain a body temperature in the normal range will improve Outcome: Progressing   Problem: Respiratory: Goal: Ability to maintain adequate ventilation will improve Outcome: Progressing Goal: Ability to maintain a clear airway will improve Outcome: Progressing   Problem: Education: Goal: Knowledge of General Education information will improve Description: Including pain rating scale, medication(s)/side effects and non-pharmacologic comfort measures Outcome: Progressing   Problem: Health Behavior/Discharge Planning: Goal: Ability to manage health-related needs will improve Outcome: Progressing   Problem: Clinical Measurements: Goal: Ability to maintain clinical measurements within normal limits will improve Outcome: Progressing Goal: Will remain free from infection Outcome: Progressing Goal: Diagnostic test results will improve Outcome: Progressing Goal: Respiratory complications will improve Outcome: Progressing Goal: Cardiovascular complication will be avoided Outcome: Progressing   Problem: Nutrition: Goal: Adequate nutrition will be maintained Outcome: Progressing   Problem: Coping: Goal: Level of anxiety will decrease Outcome: Progressing   Problem: Elimination: Goal: Will not experience complications related to bowel motility Outcome: Progressing Goal: Will not experience complications related to urinary retention Outcome: Progressing   Problem: Pain Managment: Goal: General experience of comfort will improve Outcome: Progressing   Problem: Safety: Goal: Ability to remain free from injury will improve Outcome: Progressing   Problem: Skin Integrity: Goal: Risk for impaired skin integrity will decrease Outcome: Progressing

## 2022-09-17 NOTE — Progress Notes (Signed)
   poke to patient on phone and explained the following results Positive corona virus in resp viral PCR CXR no pneumonia Cortisol level is low 3.7 Need to be repeated  in NH   Impression/Recommendation Hypotension with weakness on presentation No fever , no leucocytosis, no lactate . 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

## 2022-09-17 NOTE — TOC Transition Note (Addendum)
Transition of Care [facility]) - CM/SW Discharge Note   Patient Details  Name: Raymond Hays MRN: [id number] Date of Birth: 17-Mar-1946  Transition of [facility]) CM/SW Contact:  Kreg Shropshire, RN Phone Number: 09/17/2022, 11:39 AM   Clinical Narrative:    Received d/c order. Pt stated he would call family and tell them he is going back to [place] [employer]. EMS non emergent transport called for transport to [facility]. Notified RN by secure chat that pt #2 on list for transportation   Final next level of care: Skilled Nursing Facility [facility])     Patient Goals and CMS Choice      Discharge Placement                Patient chooses bed at: [facility] Patient to be transferred to facility by: non emergent ems transport Name of family member notified: pt stated he would call family to update Patient and family notified of of transfer: 09/17/22  Discharge Plan and Services Additional resources added to the After Visit Summary for                                       Social Determinants of Health (SDOH) Interventions SDOH Screenings   Food Insecurity: No Food Insecurity (09/14/2022)  Housing: Low Risk  (09/14/2022)  Transportation Needs: No Transportation Needs (09/14/2022)  Utilities: Not At Risk (09/14/2022)  Depression (PHQ2-9): Low Risk  (08/05/2022)  Tobacco Use: Medium Risk (09/15/2022)     Readmission Risk Interventions    01/12/2021    1:08 PM 10/20/2020    8:59 AM  Readmission Risk Prevention Plan  Transportation Screening Complete Complete  PCP or Specialist Appt within 3-5 Days  Complete  HRI or Home Care Consult  Complete  Palliative Care Screening  Not Applicable  Medication Review (RN Care Manager) Complete Referral to Pharmacy  PCP or Specialist appointment within 3-5 days of discharge Complete   HRI or Home Care Consult Complete   SW Recovery Care/Counseling Consult Complete   Palliative Care Screening Not Applicable    Skilled Nursing Facility Complete

## 2022-09-18 LAB — CULTURE, RESPIRATORY W GRAM STAIN

## 2022-09-18 LAB — CULTURE, BLOOD (ROUTINE X 2): Culture: NO GROWTH

## 2022-09-19 LAB — CULTURE, BLOOD (ROUTINE X 2)

## 2022-09-23 NOTE — Progress Notes (Unsigned)
PROVIDER NOTE: Information contained herein reflects review and annotations entered in association with encounter. Interpretation of such information and data should be left to medically-trained personnel. Information provided to patient can be located elsewhere in the medical record under "Patient Instructions". Document created using STT-dictation technology, any transcriptional errors that may result from process are unintentional.    Patient: Raymond Hays  Service Category: E/M  Provider: Oswaldo Done, MD  DOB: 05-23-1945  DOS: 09/25/2022  Referring Provider: [facility]  MRN: [id number]  Specialty: Interventional Pain Management  [facility]P: [facility]  Type: Established Patient  Setting: Ambulatory outpatient    Location: Office  Delivery: Face-to-face     HPI  [name], a 77 y.o. year old male, is here today because of his No primary diagnosis found.. [name]'s primary complain today is No chief complaint on file.  Pertinent problems: [name] has Calcific tendinitis of left shoulder; Chronic pain syndrome; Chronic pain of left wrist; Acute pain of right knee; Arthritis of left wrist; Gait difficulty; Osteoarthritis of right knee; Left rotator cuff tear arthropathy; Rupture of extensor tendon of left hand; S/P orthopedic surgery, follow-up exam; Tendon rupture of wrist, sequela; Neck pain; Parkinson disease; Edema leg; Herpes zoster conjunctivitis of left eye; Chronic pain of right knee; Primary osteoarthritis of left shoulder; Rotator cuff tear arthropathy of right shoulder; Chronic neck and back pain (1ry area of Pain) (Bilateral) (L>R); Abnormal MRI, cervical spine (08/23/2022); History of cervical spinal surgery; History of lumbar surgery; History of thoracic surgery; DDD (degenerative disc disease), cervical; Neurogenic incontinence; Spinal stenosis, thoracic; Foraminal stenosis of thoracic region; Thoracic Facet Arthropathy; Lower extremity numbness (Bilateral); Weakness of  lower extremity (Bilateral); Chronic midline low back pain with left-sided sciatica; Left foot drop; Lumbar postlaminectomy syndrome; and Abnormal MRI, thoracic spine (08/23/2022) on their pertinent problem list. Pain Assessment: Severity of   is reported as a  /10. Location:    / . Onset:  . Quality:  . Timing:  . Modifying factor(s):  Marland Kitchen Vitals:  vitals were not taken for this visit.  BMI: Estimated body mass index is 35.88 kg/m as calculated from the following:   Height as of 09/14/22: 5\' 7"  (1.702 m).   Weight as of 09/14/22: 229 lb 0.9 oz (103.9 kg). Last encounter: 08/05/2022. Last procedure: Visit date not found.  Reason for encounter: follow-up evaluation after referral to neurosurgery for possible thoracic decompression of severe spinal stenosis with evidence of myelomalacia.. ***  Pharmacotherapy Assessment  Analgesic: None MME/day: 0 mg/day   Monitoring: [place] PMP: PDMP reviewed during this encounter.       Pharmacotherapy: No side-effects or adverse reactions reported. Compliance: No problems identified. Effectiveness: Clinically acceptable.  No notes on file  No results found for: "CBDTHCR" No results found for: "D8THCCBX" No results found for: "D9THCCBX"  UDS:  Summary  Date Value Ref Range Status  07/24/2022 Note  Final    Comment:    ==================================================================== Compliance Drug Analysis, Ur ==================================================================== Test                             Result       Flag       Units  Drug Present and Declared for Prescription Verification   Gabapentin                     PRESENT      EXPECTED   Methocarbamol  PRESENT      EXPECTED   Acetaminophen                  PRESENT      EXPECTED  Drug Present not Declared for Prescription Verification   Tizanidine                     PRESENT      UNEXPECTED   Citalopram                     PRESENT      UNEXPECTED    Desmethylcitalopram            PRESENT      UNEXPECTED    Desmethylcitalopram is an expected metabolite of citalopram or the    enantiomeric form, escitalopram.  Drug Absent but Declared for Prescription Verification   Tramadol                       Not Detected UNEXPECTED ng/mg creat   Duloxetine                     Not Detected UNEXPECTED   Trazodone                      Not Detected UNEXPECTED   Hydroxyzine                    Not Detected UNEXPECTED   Guaifenesin                    Not Detected UNEXPECTED   Metoprolol                     Not Detected UNEXPECTED ==================================================================== Test                      Result    Flag   Units      Ref Range   Creatinine              53               mg/dL      >=40 ==================================================================== Declared Medications:  The flagging and interpretation on this report are based on the  following declared medications.  Unexpected results may arise from  inaccuracies in the declared medications.   **Note: The testing scope of this panel includes these medications:   Duloxetine (Cymbalta)  Gabapentin (Neurontin)  Guaifenesin (Mucinex)  Hydroxyzine (Atarax)  Methocarbamol (Robaxin)  Metoprolol (Lopressor)  Tramadol (Ultram)  Trazodone (Desyrel)   **Note: The testing scope of this panel does not include small to  moderate amounts of these reported medications:   Acetaminophen (Tylenol)   **Note: The testing scope of this panel does not include the  following reported medications:   Acetic Acid  Acyclovir (Zovirax)  Albuterol  Albuterol (Duoneb)  Apixaban (Eliquis)  Atorvastatin (Lipitor)  Carbidopa (Sinemet)  Cetirizine (Zyrtec)  Docusate (Colace)  Dupilumab (Dupixent)  Eye Drops  Fluticasone (Flonase)  Fluticasone (Trelegy)  Furosemide (Lasix)  Hydrocortisone  Ipratropium (Atrovent)  Ipratropium (Duoneb)  Iron  Levodopa (Sinemet)   Levothyroxine (Synthroid)  Metformin (Glucophage)  Metoclopramide (Reglan)  Montelukast (Singulair)  Pantoprazole (Protonix)  Psyllium (Metamucil)  Tacrolimus (Protopic)  Topical  Torsemide (Demadex)  Triamcinolone (Kenalog)  Umeclidinium (Trelegy)  Vilanterol (Trelegy) ==================================================================== For clinical consultation, please call [phone]. ====================================================================  ROS  Constitutional: Denies any fever or chills Gastrointestinal: No reported hemesis, hematochezia, vomiting, or acute GI distress Musculoskeletal: Denies any acute onset joint swelling, redness, loss of ROM, or weakness Neurological: No reported episodes of acute onset apraxia, aphasia, dysarthria, agnosia, amnesia, paralysis, loss of coordination, or loss of consciousness  Medication Review  Acidophilus, Albuterol Sulfate, Calcium Carbonate, Dextromethorphan-guaiFENesin, Fluticasone-Umeclidin-Vilant, Lido-PE-Glycerin-Petrolatum, Menthol (Topical Analgesic), OcuSoft Lid Scrub Original, Propylene Glycol, Vitamin D-3, acetaminophen, acetic acid, albuterol, allopurinol, apixaban, atorvastatin, carbidopa-levodopa, cetaphil, cetirizine, citalopram, dupilumab, famotidine, fluticasone, gabapentin, hydrOXYzine, hydrocortisone, lactase, levothyroxine, lidocaine, loperamide, melatonin, methocarbamol, oxybutynin, pantoprazole, polyethylene glycol, selenium sulfide, silver sulfADIAZINE, sodium chloride, tacrolimus, terbinafine, tiZANidine, and triamcinolone ointment  History Review  Allergy: Mr. Martis has No Known Allergies. Drug: Mr. Kitchings  reports that he does not currently use drugs after having used the following drugs: "Crack" cocaine. Alcohol:  reports that he does not currently use alcohol. Tobacco:  reports that he quit smoking about 18 years ago. His smoking use included cigarettes. He has a 5.00 pack-year smoking history. He  has never used smokeless tobacco. Social: Mr. Garman  reports that he quit smoking about 18 years ago. His smoking use included cigarettes. He has a 5.00 pack-year smoking history. He has never used smokeless tobacco. He reports that he does not currently use alcohol. He reports that he does not currently use drugs after having used the following drugs: "Crack" cocaine. Medical:  has a past medical history of Allergic rhinitis, Anxiety, Arthritis, Atrial fibrillation (HCC), Chronic indwelling Foley catheter, COPD (chronic obstructive pulmonary disease) (HCC), Cough, Diabetes (HCC), Essential (primary) hypertension, Hemorrhoid, Hyperlipidemia, PAF (paroxysmal atrial fibrillation) (HCC), Primary osteoarthritis, unspecified shoulder, Retention of urine, unspecified, Spinal stenosis, and UTI (urinary tract infection). Surgical: Mr. Melman  has a past surgical history that includes Back surgery; neck; Hernia repair; Tonsillectomy; Appendectomy; Knee surgery; Elbow surgery; Partial hip arthroplasty (Bilateral); TEE without cardioversion (N/A, 10/19/2020); Esophagogastroduodenoscopy (egd) with propofol (N/A, 10/27/2020); and Colonoscopy (N/A, 10/27/2020). Family: family history includes Healthy in his son and son.  Laboratory Chemistry Profile   Renal Lab Results  Component Value Date   BUN 33 (H) 09/17/2022   CREATININE 0.89 09/17/2022   GFRAA 55 (L) 12/15/2019   GFRNONAA >60 09/17/2022    Hepatic Lab Results  Component Value Date   AST 16 09/16/2022   ALT 11 09/16/2022   ALBUMIN 3.4 (L) 09/16/2022   ALKPHOS 87 09/16/2022   LIPASE 28 04/16/2022    Electrolytes Lab Results  Component Value Date   NA 140 09/17/2022   K 3.7 09/17/2022   CL 109 09/17/2022   CALCIUM 9.0 09/17/2022   MG 2.1 09/16/2022   PHOS 2.9 03/17/2021    Bone Lab Results  Component Value Date   VD25OH 60.59 07/24/2022    Inflammation (CRP: Acute Phase) (ESR: Chronic Phase) Lab Results  Component Value Date   CRP 1.7 (H)  07/24/2022   ESRSEDRATE 16 07/24/2022   LATICACIDVEN 1.5 09/14/2022         Note: Above Lab results reviewed.  Recent Imaging Review  DG Chest Port 1 View CLINICAL DATA:  Pneumonia  EXAM: PORTABLE CHEST 1 VIEW  COMPARISON:  Chest x-ray 04/05/2021.  FINDINGS: Enlarged cardiac silhouette. Similar cardiac clips. Both lungs are clear. No visible pleural effusions or pneumothorax. No acute osseous abnormality.  IMPRESSION: No active disease.  Cardiomegaly.  Electronically Signed   By: Feliberto Harts M.D.   On: 09/17/2022 09:28 Note: Reviewed        Physical Exam  General  appearance: Well nourished, well developed, and well hydrated. In no apparent acute distress Mental status: Alert, oriented x 3 (person, place, & time)       Respiratory: No evidence of acute respiratory distress Eyes: PERLA Vitals: There were no vitals taken for this visit. BMI: Estimated body mass index is 35.88 kg/m as calculated from the following:   Height as of 09/14/22: 5\' 7"  (1.702 m).   Weight as of 09/14/22: 229 lb 0.9 oz (103.9 kg). Ideal: Ideal body weight: 66.1 kg (145 lb 11.6 oz) Adjusted ideal body weight: 81.2 kg (179 lb 0.9 oz)  Assessment   Diagnosis Status  No diagnosis found. Controlled Controlled Controlled   Updated Problems: No problems updated.  Plan of Care  Problem-specific:  No problem-specific Assessment & Plan notes found for this encounter.  Mr. Raegan Sipp has a current medication list which includes the following long-term medication(s): albuterol, albuterol sulfate, allopurinol, apixaban, calcium carbonate, carbidopa-levodopa, fluticasone, gabapentin, levothyroxine, and pantoprazole.  Pharmacotherapy (Medications Ordered): No orders of the defined types were placed in this encounter.  Orders:  No orders of the defined types were placed in this encounter.  Follow-up plan:   No follow-ups on file.      Interventional Therapies  Risk Factors   Considerations:  Eliquis Anticoagulation: (Stop: 3 days  Restart: 6 hours)  A-fib  MVR (w/ leaflet clip implant)     Planned  Pending:   Diagnostic bilateral cervical (C2-3) paravertebral muscle steroid injection #1  Diagnostic cervical x-ray with bending views  Possible repeat CT of the thoracic spine    Under consideration:   Pending   Completed:   None at this time   Therapeutic  Palliative (PRN) options:   None established   Completed by other providers:   None reported       Recent Visits Date Type Provider Dept  08/05/22 Office Visit Delano Metz, MD [facility]  07/24/22 Office Visit Delano Metz, MD [facility]  Showing recent visits within past 90 days and meeting all other requirements Future Appointments Date Type Provider Dept  09/25/22 Appointment Delano Metz, MD [facility]  Showing future appointments within next 90 days and meeting all other requirements  I discussed the assessment and treatment plan with the patient. The patient was provided an opportunity to ask questions and all were answered. The patient agreed with the plan and demonstrated an understanding of the instructions.  Patient advised to call back or seek an in-person evaluation if the symptoms or condition worsens.  Duration of encounter: *** minutes.  Total time on encounter, as per AMA guidelines included both the face-to-face and non-face-to-face time personally spent by the physician and/or other qualified health care professional(s) on the day of the encounter (includes time in activities that require the physician or other qualified health care professional and does not include time in activities normally performed by clinical staff). Physician's time may include the following activities when performed: Preparing to see the patient (e.g., pre-charting review of records, searching for previously ordered imaging, lab work, and nerve  conduction tests) Review of prior analgesic pharmacotherapies. Reviewing PMP Interpreting ordered tests (e.g., lab work, imaging, nerve conduction tests) Performing post-procedure evaluations, including interpretation of diagnostic procedures Obtaining and/or reviewing separately obtained history Performing a medically appropriate examination and/or evaluation Counseling and educating the patient/family/caregiver Ordering medications, tests, or procedures Referring and communicating with other health care professionals (when not separately reported) Documenting clinical information in the electronic or other health record  Independently interpreting results (not separately reported) and communicating results to the patient/ family/caregiver Care coordination (not separately reported)  Note by: Oswaldo Done, MD Date: 09/25/2022; Time: 5:58 AM

## 2022-09-25 ENCOUNTER — Encounter: Payer: Self-pay | Admitting: Pain Medicine

## 2022-09-25 ENCOUNTER — Ambulatory Visit: Payer: No Typology Code available for payment source | Attending: Pain Medicine | Admitting: Pain Medicine

## 2022-09-25 VITALS — BP 113/75 | HR 93 | Temp 97.5°F | Resp 18 | Ht 67.0 in | Wt 222.0 lb

## 2022-09-25 DIAGNOSIS — Z9889 Other specified postprocedural states: Secondary | ICD-10-CM | POA: Insufficient documentation

## 2022-09-25 DIAGNOSIS — M4804 Spinal stenosis, thoracic region: Secondary | ICD-10-CM | POA: Diagnosis present

## 2022-09-25 DIAGNOSIS — R937 Abnormal findings on diagnostic imaging of other parts of musculoskeletal system: Secondary | ICD-10-CM | POA: Insufficient documentation

## 2022-09-25 DIAGNOSIS — M4714 Other spondylosis with myelopathy, thoracic region: Secondary | ICD-10-CM | POA: Insufficient documentation

## 2022-09-29 ENCOUNTER — Emergency Department: Payer: No Typology Code available for payment source

## 2022-09-29 ENCOUNTER — Emergency Department
Admission: EM | Admit: 2022-09-29 | Discharge: 2022-09-30 | Disposition: A | Discharge status: Home / Self Care | Payer: No Typology Code available for payment source | Attending: Emergency Medicine | Admitting: Emergency Medicine

## 2022-09-29 ENCOUNTER — Other Ambulatory Visit: Payer: Self-pay

## 2022-09-29 DIAGNOSIS — R110 Nausea: Secondary | ICD-10-CM | POA: Diagnosis not present

## 2022-09-29 DIAGNOSIS — K5900 Constipation, unspecified: Secondary | ICD-10-CM

## 2022-09-29 DIAGNOSIS — E039 Hypothyroidism, unspecified: Secondary | ICD-10-CM | POA: Insufficient documentation

## 2022-09-29 DIAGNOSIS — I4891 Unspecified atrial fibrillation: Secondary | ICD-10-CM | POA: Insufficient documentation

## 2022-09-29 DIAGNOSIS — R140 Abdominal distension (gaseous): Secondary | ICD-10-CM | POA: Insufficient documentation

## 2022-09-29 DIAGNOSIS — J449 Chronic obstructive pulmonary disease, unspecified: Secondary | ICD-10-CM | POA: Diagnosis not present

## 2022-09-29 DIAGNOSIS — Z7901 Long term (current) use of anticoagulants: Secondary | ICD-10-CM | POA: Diagnosis not present

## 2022-09-29 DIAGNOSIS — E119 Type 2 diabetes mellitus without complications: Secondary | ICD-10-CM | POA: Diagnosis not present

## 2022-09-29 DIAGNOSIS — I10 Essential (primary) hypertension: Secondary | ICD-10-CM | POA: Diagnosis not present

## 2022-09-29 LAB — COMPREHENSIVE METABOLIC PANEL
ALT: 8 U/L (ref 0–44)
AST: 16 U/L (ref 15–41)
Albumin: 3.7 g/dL (ref 3.5–5.0)
Alkaline Phosphatase: 113 U/L (ref 38–126)
Anion gap: 8 (ref 5–15)
BUN: 28 mg/dL — ABNORMAL HIGH (ref 8–23)
CO2: 23 mmol/L (ref 22–32)
Calcium: 9.1 mg/dL (ref 8.9–10.3)
Chloride: 109 mmol/L (ref 98–111)
Creatinine, Ser: 1.02 mg/dL (ref 0.61–1.24)
GFR, Estimated: >60 mL/min (ref >60)
Glucose, Bld: 120 mg/dL — ABNORMAL HIGH (ref 70–99)
Potassium: 4.7 mmol/L (ref 3.5–5.1)
Sodium: 140 mmol/L (ref 135–145)
Total Bilirubin: 0.7 mg/dL (ref 0.3–1.2)
Total Protein: 7.2 g/dL (ref 6.5–8.1)

## 2022-09-29 LAB — CBC WITH DIFFERENTIAL/PLATELET
Abs Immature Granulocytes: 0.04 10*3/uL (ref 0.00–0.07)
Basophils Absolute: 0.1 10*3/uL (ref 0.0–0.1)
Basophils Relative: 1 %
Eosinophils Absolute: 0.1 10*3/uL (ref 0.0–0.5)
Eosinophils Relative: 1 %
HCT: 39.2 % (ref 39.0–52.0)
Hemoglobin: 12.2 g/dL — ABNORMAL LOW (ref 13.0–17.0)
Immature Granulocytes: 0 %
Lymphocytes Relative: 13 %
Lymphs Abs: 1.4 10*3/uL (ref 0.7–4.0)
MCH: 28 pg (ref 26.0–34.0)
MCHC: 31.1 g/dL (ref 30.0–36.0)
MCV: 89.9 fL (ref 80.0–100.0)
Monocytes Absolute: 0.7 10*3/uL (ref 0.1–1.0)
Monocytes Relative: 7 %
Neutro Abs: 7.9 10*3/uL — ABNORMAL HIGH (ref 1.7–7.7)
Neutrophils Relative %: 78 %
Platelets: 319 10*3/uL (ref 150–400)
RBC: 4.36 MIL/uL (ref 4.22–5.81)
RDW: 14.7 % (ref 11.5–15.5)
WBC: 10.2 10*3/uL (ref 4.0–10.5)
nRBC: 0 % (ref 0.0–0.2)

## 2022-09-29 LAB — LIPASE, BLOOD: Lipase: 28 U/L (ref 11–51)

## 2022-09-29 MED ORDER — METAMUCIL SMOOTH TEXTURE 58.6 % PO POWD: 1.0000 | Freq: Three times a day (TID) | ORAL | 1 refills | Status: AC

## 2022-09-29 MED ORDER — SENNA 8.6 MG PO TABS: 1.0000 | ORAL_TABLET | Freq: Every day | ORAL | 0 refills | Status: AC | PRN

## 2022-09-29 MED ORDER — LACTATED RINGERS IV BOLUS
1000.0000 mL | Freq: Once | INTRAVENOUS | Status: AC
  Administered 2022-09-29: 1000 mL via INTRAVENOUS

## 2022-09-29 MED ORDER — IOHEXOL 300 MG/ML  SOLN
100.0000 mL | Freq: Once | INTRAMUSCULAR | Status: AC | PRN
  Administered 2022-09-29: 100 mL via INTRAVENOUS

## 2022-09-29 NOTE — ED Provider Notes (Signed)
[facility] Provider Note    Event Date/Time   First MD Initiated Contact with Patient 09/29/22 1722     (approximate)   History   Chief Complaint abdominal distention   HPI  Raymond Hays is a 77 y.o. male with past medical history of hypertension, diabetes, atrial fibrillation on Eliquis, COPD, Parkinson disease, hypothyroidism, and neurogenic bladder status post suprapubic catheter who presents to the ED complaining of abdominal distention.  Patient reports that he has been dealing with increasing abdominal distention and discomfort since getting up this morning.  He states that he had 1 bowel movement today but it was smaller than usual.  He has been feeling nauseous but has not had any vomiting, does state that he was able to eat his usual dinner today.  He currently resides at [facility] and staff there called EMS due to concern for ileus or obstruction.  Patient denies any difficulties with his suprapubic catheter.     Physical Exam   Triage Vital Signs: ED Triage Vitals  Enc Vitals Group     BP 09/29/22 1718 99/73     Pulse Rate 09/29/22 1718 66     Resp 09/29/22 1718 16     Temp 09/29/22 1718 98.3 F (36.8 C)     Temp Source 09/29/22 1718 Oral     SpO2 09/29/22 1718 96 %     Weight 09/29/22 1720 222 lb (100.7 kg)     Height 09/29/22 1720 5\' 7"  (1.702 m)     Head Circumference --      Peak Flow --      Pain Score 09/29/22 1719 0     Pain Loc --      Pain Edu? --      Excl. in GC? --     Most recent vital signs: Vitals:   09/29/22 1723 09/29/22 1730  BP: (!) 107/58 113/69  Pulse: 78 95  Resp: (!) 21 20  Temp:    SpO2: 92% 93%    Constitutional: Alert and oriented. Eyes: Conjunctivae are normal. Head: Atraumatic. Nose: No congestion/rhinnorhea. Mouth/Throat: Mucous membranes are moist.  Cardiovascular: Normal rate, regular rhythm. Grossly normal heart sounds.  2+ radial pulses bilaterally. Respiratory: Normal respiratory  effort.  No retractions. Lungs CTAB. Gastrointestinal: Soft with mild tenderness throughout.  Moderately distended. Genitourinary: Suprapubic catheter draining clear yellow urine. Musculoskeletal: No lower extremity tenderness nor edema.  Neurologic:  Normal speech and language. No gross focal neurologic deficits are appreciated.    ED Results / Procedures / Treatments   Labs (all labs ordered are listed, but only abnormal results are displayed) Labs Reviewed  COMPREHENSIVE METABOLIC PANEL - Abnormal; Notable for the following components:      Result Value   Glucose, Bld 120 (*)    BUN 28 (*)    All other components within normal limits  CBC WITH DIFFERENTIAL/PLATELET - Abnormal; Notable for the following components:   Hemoglobin 12.2 (*)    Neutro Abs 7.9 (*)    All other components within normal limits  LIPASE, BLOOD  URINALYSIS, ROUTINE W REFLEX MICROSCOPIC     EKG  ED ECG REPORT I, Chesley Noon, the attending physician, personally viewed and interpreted this ECG.   Date: 09/29/2022  EKG Time: 17:22  Rate: 93  Rhythm: atrial fibrillation  Axis: Normal  Intervals: Borderline prolonged QT  ST&T Change: None  RADIOLOGY CT abdomen/pelvis reviewed and interpreted by me with gaseous distention of the large bowel and significant  constipation, no focal fluid collections or inflammatory changes noted.  PROCEDURES:  Critical Care performed: No  Procedures  ------------------------------------------------------------------------------------------------------------------- Fecal Disimpaction Procedure Note:  Performed by me:  Patient placed in the lateral recumbent position with knees drawn towards chest. Nurse present for patient support. Large amount of hard brown stool removed. No complications during procedure.   ------------------------------------------------------------------------------------------------------------------    MEDICATIONS ORDERED IN  ED: Medications  lactated ringers bolus 1,000 mL (1,000 mLs Intravenous New Bag/Given 09/29/22 1741)  iohexol (OMNIPAQUE) 300 MG/ML solution 100 mL (100 mLs Intravenous Contrast Given 09/29/22 1800)     IMPRESSION / MDM / ASSESSMENT AND PLAN / ED COURSE  I reviewed the triage vital signs and the nursing notes.                              77 y.o. male with past medical history of hypertension, diabetes, atrial fibrillation on Eliquis, COPD, hypothyroidism, Parkinson's disease, and neurogenic bladder status post suprapubic catheter who presents to the ED with increasing abdominal pain and distention over the course of the day today.  Patient's presentation is most consistent with acute presentation with potential threat to life or bodily function.  Differential diagnosis includes, but is not limited to, bowel obstruction, ileus, constipation, dehydration, electrolyte abnormality, AKI.  Patient nontoxic-appearing and in no acute distress, vital signs are unremarkable.  His abdomen is soft but moderately distended with mild diffuse tenderness.  We will further assess with CT imaging for evidence of obstruction versus ileus, hydrate with IV fluids but patient declines pain or nausea medication.  Labs without significant anemia or leukocytosis, additional results pending at this time.  CT imaging shows gaseous distention of the large bowel with significant constipation, no convincing evidence of obstruction.  On reassessment, patient denies any pain and has not had any nausea or vomiting, doubt significant bowel obstruction.  He was manually disimpacted with some movement of stool.  Patient appropriate for outpatient management, will start him on senna and fiber supplement.  He was counseled to follow-up with his PCP and return to the ED for new or worsening symptoms.  Patient agrees with plan.      FINAL CLINICAL IMPRESSION(S) / ED DIAGNOSES   Final diagnoses:  Constipation, unspecified  constipation type  Abdominal distension     Rx / DC Orders   ED Discharge Orders          Ordered    senna (SENOKOT) 8.6 MG TABS tablet  Daily PRN        09/29/22 2031    psyllium (METAMUCIL SMOOTH TEXTURE) 58.6 % powder  3 times daily        09/29/22 2031             Note:  This document was prepared using Dragon voice recognition software and may include unintentional dictation errors.   Chesley Noon, MD 09/29/22 2034

## 2022-09-29 NOTE — ED Triage Notes (Signed)
Pt arrives via [facility] w/ c/o abdominal distention, has had x2 bowel movements today without relief of distention. Pt denies pain. EMS reports possible ileus.   VSS  Hx HTN, COPD, parkinsons

## 2022-09-29 NOTE — ED Notes (Signed)
called to [facility] for transport /[facility]/rep:jessica.

## 2022-09-30 IMAGING — DX DG ABDOMEN 1V
2 series · 2 of 2 positions shown · non-contrast
Comparison: None.

CLINICAL DATA: Intractable vomiting with nausea.

EXAM:
ABDOMEN - 1 VIEW

[abdomen supine (1 of 2)]
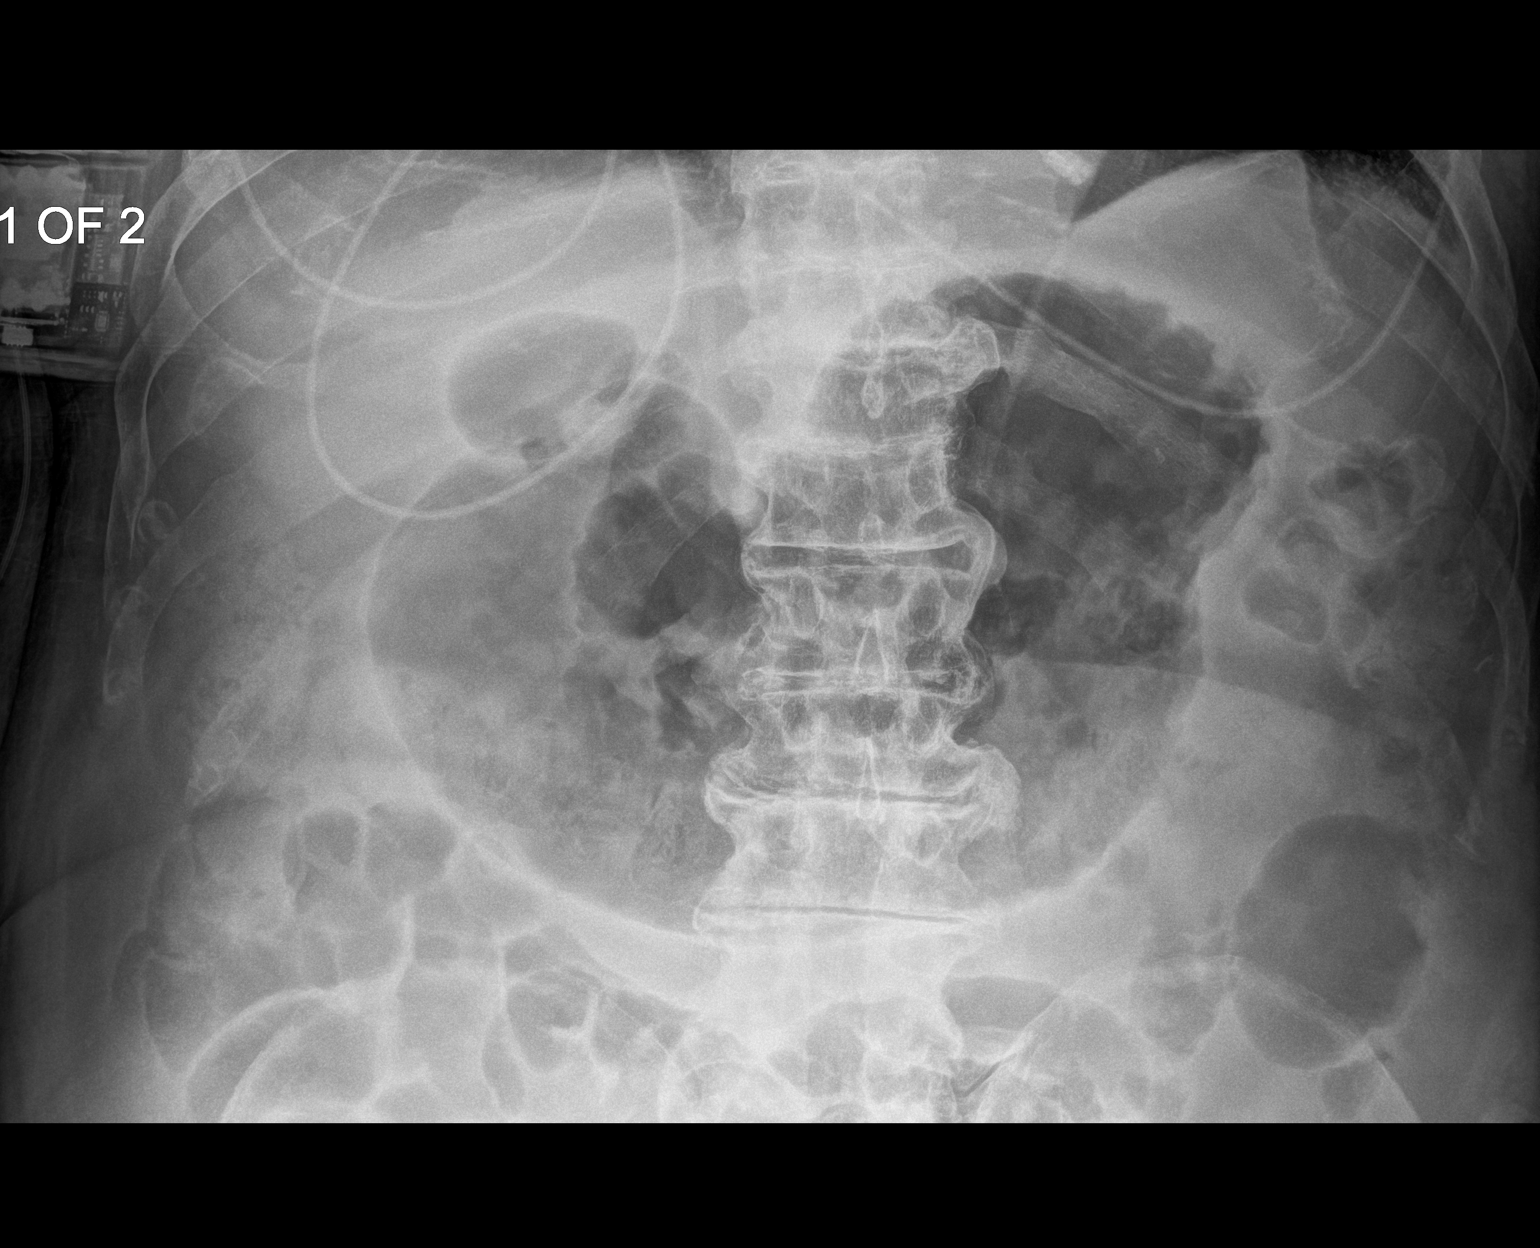

[abdomen supine (2 of 2)]
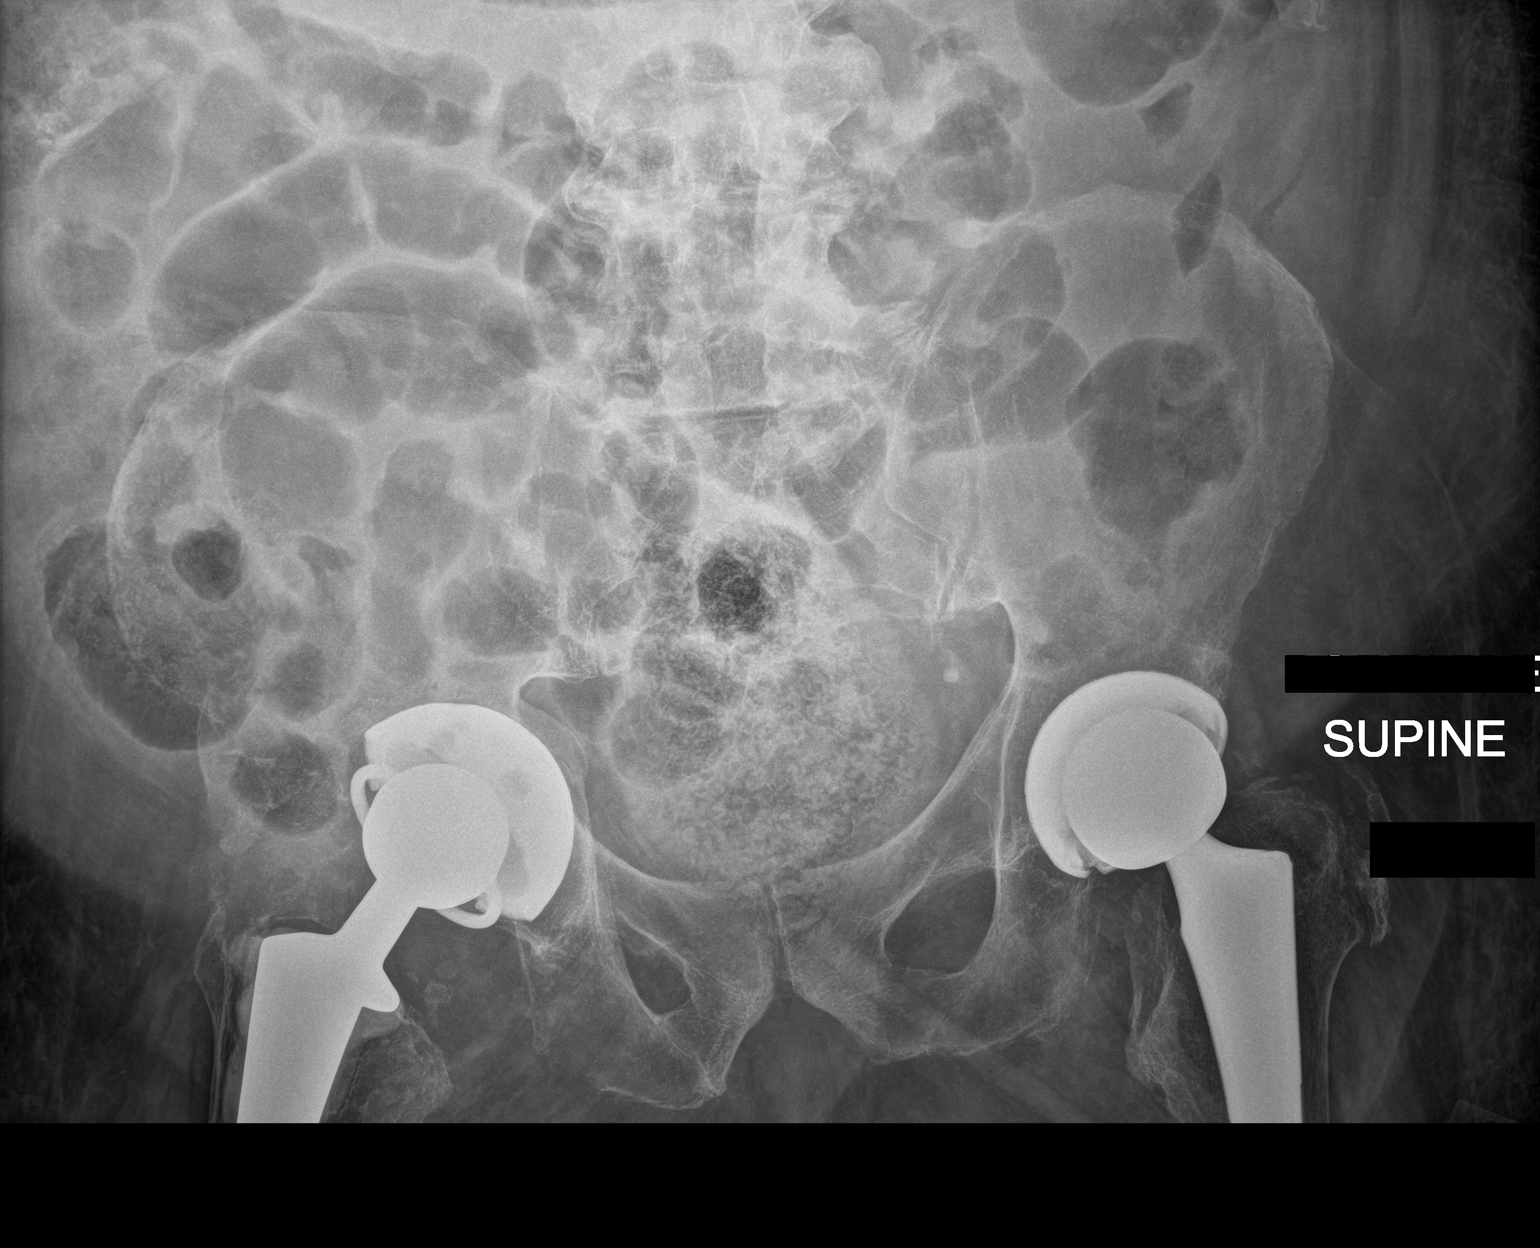

[2 of 2 positions shown; findings below may reference images not displayed]

FINDINGS: Supine KUB shows fairly marked gaseous distention of the stomach.
There is diffuse gaseous distension of small bowel with air and
stool scattered along the length of the colon. Bones are diffusely
demineralized with degenerative changes in the lumbar spine. Patient
is status post bilateral hip replacement.
IMPRESSION: Marked gaseous distention of the stomach with diffuse gaseous
distention of small bowel. Imaging features are compatible with
ileus. While bowel gas pattern does not appear frankly obstructive,
follow-up imaging may be warranted.

## 2022-11-20 NOTE — Progress Notes (Addendum)
Celso Amy, PA-C [address]  [address]  [place], [place] [address]  Main: [phone]  Fax: [phone]   Primary Care Physician: Pearson Grippe, MD  Primary Gastroenterologist:  Celso Amy, PA-C / Dr. Lannette Donath    CC:  Anemia, Constipation  HPI: Raymond Hays is a 77 y.o. male presents for hospital follow-up of constipation and anemia.  Medical history of HTN, DM, Afib on Eliquis, COPD, Parkinsons, Hypothyroid, and neurogenic bladder status post suprapubic catheter.  He resides at [facility]    He presented to [facility] ED 09/29/2022 having significant abdominal distention.  Abdominal pelvic CT showed moderate to marked air distention of the colon, worst involving the rectosigmoid colon.  No obstruction.  Moderate to large formed stool in the colon with moderate feces in the rectum.  Incidental cholelithiasis.  Suprapubic catheter with decompressed urinary bladder.  Labs 09/29/2022: WBC 10.2, stable improved hemoglobin 12.2, MCV 89.  Glucose 120, BUN 28, creatinine 1.02, normal LFTs.  Normal lipase 28.  He is no longer on Iron.  Current GI medicines include pantoprazole 40 Mg daily, Imodium as needed for diarrhea, Metamucil, Senokot, and MiraLAX as needed.  He is not consistently taking MiraLAX or Senokot daily, just as needed.  Colonoscopy 09/2020: Showed poor colon prep.  Stool in the entire colon.  No specimens collected.  EGD 09/2020: Esophageal candidiasis, no stomach, 3 nonbleeding duodenal ulcers (largest 10 mm).   Current Outpatient Medications  Medication Sig Dispense Refill   acetaminophen (TYLENOL) 325 MG tablet Take 650 mg by mouth every 4 (four) hours as needed for mild pain.     acetic acid 0.25 % irrigation Irrigate with 1 Application as directed every 12 (twelve) hours. (Flush foley catheter)     albuterol (PROVENTIL) (2.5 MG/3ML) 0.083% nebulizer solution Take 2.5 mg by nebulization every 8 (eight) hours as needed for wheezing or shortness of  breath.     Albuterol Sulfate (PROAIR RESPICLICK) 108 (90 Base) MCG/ACT AEPB Inhale 2 puffs into the lungs every 4 (four) hours as needed (COPD).     allopurinol (ZYLOPRIM) 100 MG tablet Take 100 mg by mouth daily.     apixaban (ELIQUIS) 5 MG TABS tablet Take 5 mg by mouth 2 (two) times daily.      atorvastatin (LIPITOR) 20 MG tablet Take 20 mg by mouth at bedtime.     Calcium Carbonate (CALCIUM 500 PO) Take 2 tablets by mouth every 6 (six) hours as needed (indigestion/heartburn).     carbidopa-levodopa (SINEMET IR) 25-100 MG tablet Take 1 tablet by mouth 3 (three) times daily before meals.     cetirizine (ZYRTEC) 5 MG tablet Take 5 mg by mouth daily.     Cholecalciferol (VITAMIN D-3) 25 MCG (1000 UT) CAPS Take 1,000 Units by mouth daily.     citalopram (CELEXA) 10 MG tablet Take 10 mg by mouth daily.     Dextromethorphan-guaiFENesin (COUGH & CHEST CONGESTION DM) 5-50 MG/5ML SYRP Take 10 mLs by mouth every 4 (four) hours as needed (cough).     dupilumab (DUPIXENT) 300 MG/2ML prefilled syringe Inject 300 mg into the skin every 14 (fourteen) days.     Emollient (CETAPHIL) cream Apply 1 Application topically 2 (two) times daily as needed.     Eyelid Cleansers (OCUSOFT LID SCRUB ORIGINAL) PADS Place 1 application  into both eyes daily.     Eyelid Cleansers (OCUSOFT LID SCRUB ORIGINAL) PADS Place 1 Pad into both eyes every 12 (twelve) hours as needed (eyelid  cleansing).     famotidine (PEPCID) 20 MG tablet Take 20 mg by mouth at bedtime.     fluticasone (FLONASE) 50 MCG/ACT nasal spray Place 2 sprays into both nostrils daily.     Fluticasone-Umeclidin-Vilant 100-62.5-25 MCG/ACT AEPB Inhale 1 puff into the lungs daily.     gabapentin (NEURONTIN) 800 MG tablet Take 800 mg by mouth 2 (two) times daily.      hydrocortisone (ANUSOL-HC) 25 MG suppository Place 25 mg rectally every 4 (four) hours as needed for hemorrhoids or anal itching.     hydrOXYzine (ATARAX/VISTARIL) 25 MG tablet Take 25 mg by mouth  every 6 (six) hours as needed for itching.     lactase (LACTAID) 3000 units tablet Take 3,000 Units by mouth 3 (three) times daily with meals.     Lido-PE-Glycerin-Petrolatum (PREPARATION H RAPID RELIEF RE) Place 1 suppository rectally every 4 (four) hours as needed (hemorrhoids).     lidocaine 4 % Place 1 patch onto the skin daily as needed (pain). (Remove after 12 hours)     loperamide (IMODIUM) 2 MG capsule Take 2 mg by mouth every 4 (four) hours as needed for diarrhea or loose stools. (Max 16mg  daily)     melatonin 5 MG TABS Take 10 mg by mouth at bedtime.     Menthol, Topical Analgesic, (BIOFREEZE) 4 % GEL Apply 1 application  topically 3 (three) times daily. (Apply to neck)     methocarbamol (ROBAXIN) 500 MG tablet Take 1,000 mg by mouth 2 (two) times daily.     oxybutynin (DITROPAN) 5 MG tablet Take 5 mg by mouth 2 (two) times daily.     pantoprazole (PROTONIX) 40 MG tablet Take 1 tablet (40 mg total) by mouth 2 (two) times daily before a meal. For 8 weeks. (Patient taking differently: Take 40 mg by mouth daily.)  2   polyethylene glycol (MIRALAX / GLYCOLAX) 17 g packet Take 17 g by mouth daily as needed for moderate constipation.     Propylene Glycol 0.6 % SOLN Place 2 drops into both eyes in the morning, at noon, and at bedtime.     psyllium (METAMUCIL SMOOTH TEXTURE) 58.6 % powder Take 1 packet by mouth 3 (three) times daily. 283 g 1   selenium sulfide (SELSUN) 1 % LOTN Apply 1 Application topically 2 (two) times a week. (Tuesday and Friday)     senna (SENOKOT) 8.6 MG TABS tablet Take 1 tablet (8.6 mg total) by mouth daily as needed for mild constipation. 120 tablet 0   silver sulfADIAZINE (SILVADENE) 1 % cream Apply 1 Application topically daily. (Apply to arms and face)     sodium chloride (OCEAN) 0.65 % SOLN nasal spray Place 2 sprays into both nostrils every 6 (six) hours as needed for congestion.     tacrolimus (PROTOPIC) 0.03 % ointment Apply 1 application  topically daily. (Apply to  eyelids)     terbinafine (LAMISIL) 1 % cream Apply 1 Application topically at bedtime. (Apply to feet)     tiZANidine (ZANAFLEX) 2 MG tablet Take 2 mg by mouth 2 (two) times daily.     No current facility-administered medications for this visit.    Allergies as of 11/21/2022   (No Known Allergies)    Past Medical History:  Diagnosis Date   Allergic rhinitis    Anxiety    Arthritis    Atrial fibrillation (HCC)    Chronic indwelling Foley catheter    COPD (chronic obstructive pulmonary disease) (HCC)    Cough  Diabetes (HCC)    Essential (primary) hypertension    Hemorrhoid    Hyperlipidemia    PAF (paroxysmal atrial fibrillation) (HCC)    Primary osteoarthritis, unspecified shoulder    Retention of urine, unspecified    Spinal stenosis    UTI (urinary tract infection)     Past Surgical History:  Procedure Laterality Date   APPENDECTOMY     BACK SURGERY     COLONOSCOPY N/A 10/27/2020   Procedure: COLONOSCOPY;  Surgeon: Wyline Mood, MD;  Location: [facility] ENDOSCOPY;  Service: Gastroenterology;  Laterality: N/A;   ELBOW SURGERY     ESOPHAGOGASTRODUODENOSCOPY (EGD) WITH PROPOFOL N/A 10/27/2020   Procedure: ESOPHAGOGASTRODUODENOSCOPY (EGD) WITH PROPOFOL;  Surgeon: Wyline Mood, MD;  Location: [facility] ENDOSCOPY;  Service: Gastroenterology;  Laterality: N/A;   HERNIA REPAIR     KNEE SURGERY     neck     PARTIAL HIP ARTHROPLASTY Bilateral    TEE WITHOUT CARDIOVERSION N/A 10/19/2020   Procedure: TRANSESOPHAGEAL ECHOCARDIOGRAM (TEE);  Surgeon: Antonieta Iba, MD;  Location: [facility] ORS;  Service: Cardiovascular;  Laterality: N/A;   TONSILLECTOMY      Review of Systems:    All systems reviewed and negative except where noted in HPI.   Physical Examination:   BP (!) 141/88   Pulse 66   Temp 98.5 F (36.9 C)   Ht 5' 7.5" (1.715 m)   BMI 34.26 kg/m   General: Chronically ill appearing, elderly overweight male; wheelchair dependent; in no acute distress. Not able to get on exam  table. Lungs: Clear to auscultation bilaterally. Non-labored. Heart: Regular rate and rhythm, no murmurs rubs or gallops.  Abdomen: Bowel sounds are normal; Abdomen obese and very distended.  No abdominal tenderness.    Legs: Bilateral lower extremity edema. Psych: Alert and cooperative, Some memory impairment.  He did not know why he was here today.  Imaging Studies: See HPI.  Assessment and Plan:   Jenkins Ninneman is a 77 y.o. y/o male here for followup of:  Iron Deficiency Anemia:  Labs today: CBC, iron panel, ferritin, B12, folate Not currently on Iron.  If iron is low, then we will start him on iron.  2.   Chronic Constipation  Stop Imodium.  Increase MiraLAX powder to 2 capfuls every day.  Take Senokot 8.6 mg 2 tablets every day.  Continue Metamucil fiber supplement   Drink 64 ounces of fluids daily.  Eat high-fiber diet with fruits and vegetables.  Consider starting Linzess or Trulance if constipation worsens.   3.   GERD / Hx Duodenal Ulcer:   Continue Pantoprazole 40mg  daily  Avoid NSAIDS.   Celso Amy, PA-C  Follow up in 3 months with TG or Dr. Allegra Lai.

## 2022-11-21 ENCOUNTER — Ambulatory Visit
Admission: RE | Admit: 2022-11-21 | Discharge: 2022-11-21 | Disposition: A | Discharge status: Home / Self Care | Payer: Self-pay | Source: Ambulatory Visit | Attending: Neurosurgery | Admitting: Neurosurgery

## 2022-11-21 ENCOUNTER — Encounter: Payer: Self-pay | Admitting: Physician Assistant

## 2022-11-21 ENCOUNTER — Ambulatory Visit (INDEPENDENT_AMBULATORY_CARE_PROVIDER_SITE_OTHER): Payer: No Typology Code available for payment source | Admitting: Physician Assistant

## 2022-11-21 ENCOUNTER — Other Ambulatory Visit: Payer: Self-pay | Admitting: Family Medicine

## 2022-11-21 VITALS — BP 141/88 | HR 66 | Temp 98.5°F | Ht 67.5 in

## 2022-11-21 DIAGNOSIS — D649 Anemia, unspecified: Secondary | ICD-10-CM

## 2022-11-21 DIAGNOSIS — Z049 Encounter for examination and observation for unspecified reason: Secondary | ICD-10-CM

## 2022-11-21 DIAGNOSIS — D509 Iron deficiency anemia, unspecified: Secondary | ICD-10-CM | POA: Diagnosis not present

## 2022-11-21 DIAGNOSIS — Z8719 Personal history of other diseases of the digestive system: Secondary | ICD-10-CM

## 2022-11-21 DIAGNOSIS — K219 Gastro-esophageal reflux disease without esophagitis: Secondary | ICD-10-CM | POA: Diagnosis not present

## 2022-11-21 DIAGNOSIS — K5909 Other constipation: Secondary | ICD-10-CM | POA: Diagnosis not present

## 2022-11-21 DIAGNOSIS — K5901 Slow transit constipation: Secondary | ICD-10-CM

## 2022-11-22 ENCOUNTER — Telehealth: Payer: Self-pay

## 2022-11-22 LAB — CBC WITH DIFFERENTIAL/PLATELET
Basophils Absolute: 0.1 10*3/uL (ref 0.0–0.2)
Basos: 1 %
EOS (ABSOLUTE): 0.3 10*3/uL (ref 0.0–0.4)
Eos: 2 %
Hematocrit: 43 % (ref 37.5–51.0)
Hemoglobin: 13.4 g/dL (ref 13.0–17.7)
Immature Grans (Abs): 0.1 10*3/uL (ref 0.0–0.1)
Immature Granulocytes: 1 %
Lymphocytes Absolute: 1.7 10*3/uL (ref 0.7–3.1)
Lymphs: 13 %
MCH: 25.8 pg — ABNORMAL LOW (ref 26.6–33.0)
MCHC: 31.2 g/dL — ABNORMAL LOW (ref 31.5–35.7)
MCV: 83 fL (ref 79–97)
Monocytes Absolute: 0.9 10*3/uL (ref 0.1–0.9)
Monocytes: 6 %
Neutrophils Absolute: 10.6 10*3/uL — ABNORMAL HIGH (ref 1.4–7.0)
Neutrophils: 77 %
Platelets: 378 10*3/uL (ref 150–450)
RBC: 5.19 x10E6/uL (ref 4.14–5.80)
RDW: 15.2 % (ref 11.6–15.4)
WBC: 13.6 10*3/uL — ABNORMAL HIGH (ref 3.4–10.8)

## 2022-11-22 LAB — IRON,TIBC AND FERRITIN PANEL
Ferritin: 51 ng/mL (ref 30–400)
Iron Saturation: 6 % — CL (ref 15–55)
Iron: 28 ug/dL — ABNORMAL LOW (ref 38–169)
Total Iron Binding Capacity: 438 ug/dL (ref 250–450)
UIBC: 410 ug/dL — ABNORMAL HIGH (ref 111–343)

## 2022-11-22 LAB — B12 AND FOLATE PANEL
Folate: 13.9 ng/mL (ref >3.0)
Vitamin B-12: 328 pg/mL (ref 232–1245)

## 2022-11-22 NOTE — Telephone Encounter (Signed)
Patient notified. Spoke with Allison-LPN at [facility].  Patient lives at [facility].  Please call and notify patient and his facility:  1.  Hemoglobin has greatly improved to 13.4.  B12 and folate are normal.  2.  Iron is low.  Please start ferrous sulfate 325 mg 1 tablet once daily.  3.  We can repeat CBC and iron panel at his follow-up office visit in 3 months.  They can also monitor his lab work at his facility.  Celso Amy, PA-C

## 2022-11-22 NOTE — Progress Notes (Signed)
Patient lives at [facility].  Please call and notify patient and his facility: 1.  Hemoglobin has greatly improved to 13.4.  B12 and folate are normal. 2.  Iron is low.  Please start ferrous sulfate 325 mg 1 tablet once daily. 3.  We can repeat CBC and iron panel at his follow-up office visit in 3 months.  They can also monitor his lab work at his facility. Celso Amy, PA-C

## 2022-11-26 ENCOUNTER — Telehealth: Payer: Self-pay | Admitting: Physician Assistant

## 2022-11-26 NOTE — Progress Notes (Unsigned)
Referring Physician:  Pearson Grippe, MD [address] [place],  [place] [address]  Primary Physician:  Pearson Grippe, MD  History of Present Illness: 11/28/2022 [name] is here today with a chief complaint of back pain.  He has been having trouble with his balance for some time.  Of note, he had cervical surgery several years ago with anterior and posterior neck surgery.  He has also had lumbar spine surgery approximately 60 years ago.  He presents today with inability to walk for approximately 1 year.  He cannot feel his legs below his waist.  He has urinary dysfunction and requires an indwelling catheter.  He cannot feel when he needs to go to the restroom or have a bowel movement.  He is able to get out of his chair, but requires significant assistance.  He has significant underlying health issues.  He is living in an assisted living facility.  Conservative measures:  Physical therapy: denies   Multimodal medical therapy including regular antiinflammatories:  tylenol, gabapentin, robaxin Injections:  has not received epidural steroid injections  Past Surgery:  Lumbar Surgery (1967 ) L4-5   Jacinto Leva has clear  symptoms of cervical myelopathy.  The symptoms are causing a significant impact on the patient's life.   I have utilized the care everywhere function in epic to review the outside records available from external health systems.  Review of Systems:  A 10 point review of systems is negative, except for the pertinent positives and negatives detailed in the HPI.  Past Medical History: Past Medical History:  Diagnosis Date   Allergic rhinitis    Anxiety    Arthritis    Atrial fibrillation (HCC)    Chronic indwelling Foley catheter    COPD (chronic obstructive pulmonary disease) (HCC)    Cough    Diabetes (HCC)    Essential (primary) hypertension    Hemorrhoid    Hyperlipidemia    PAF (paroxysmal atrial fibrillation) (HCC)    Primary  osteoarthritis, unspecified shoulder    Retention of urine, unspecified    Spinal stenosis    UTI (urinary tract infection)     Past Surgical History: Past Surgical History:  Procedure Laterality Date   APPENDECTOMY     BACK SURGERY     COLONOSCOPY N/A 10/27/2020   Procedure: COLONOSCOPY;  Surgeon: Wyline Mood, MD;  Location: [facility] ENDOSCOPY;  Service: Gastroenterology;  Laterality: N/A;   ELBOW SURGERY     ESOPHAGOGASTRODUODENOSCOPY (EGD) WITH PROPOFOL N/A 10/27/2020   Procedure: ESOPHAGOGASTRODUODENOSCOPY (EGD) WITH PROPOFOL;  Surgeon: Wyline Mood, MD;  Location: [facility] ENDOSCOPY;  Service: Gastroenterology;  Laterality: N/A;   HERNIA REPAIR     KNEE SURGERY     neck     PARTIAL HIP ARTHROPLASTY Bilateral    TEE WITHOUT CARDIOVERSION N/A 10/19/2020   Procedure: TRANSESOPHAGEAL ECHOCARDIOGRAM (TEE);  Surgeon: Antonieta Iba, MD;  Location: [facility] ORS;  Service: Cardiovascular;  Laterality: N/A;   TONSILLECTOMY      Allergies: Allergies as of 11/28/2022   (No Known Allergies)    Medications:  Current Outpatient Medications:    acetaminophen (TYLENOL) 325 MG tablet, Take 650 mg by mouth every 4 (four) hours as needed for mild pain., Disp: , Rfl:    albuterol (PROVENTIL) (2.5 MG/3ML) 0.083% nebulizer solution, Take 2.5 mg by nebulization every 8 (eight) hours as needed for wheezing or shortness of breath., Disp: , Rfl:    allopurinol (ZYLOPRIM) 100 MG tablet, Take 100 mg by mouth daily.,  Disp: , Rfl:    apixaban (ELIQUIS) 5 MG TABS tablet, Take 5 mg by mouth 2 (two) times daily. , Disp: , Rfl:    atorvastatin (LIPITOR) 20 MG tablet, Take 20 mg by mouth at bedtime., Disp: , Rfl:    Calcium Carbonate (CALCIUM 500 PO), Take 2 tablets by mouth every 6 (six) hours as needed (indigestion/heartburn)., Disp: , Rfl:    carbidopa-levodopa (SINEMET IR) 25-100 MG tablet, Take 1 tablet by mouth 3 (three) times daily before meals., Disp: , Rfl:    cetirizine (ZYRTEC) 5 MG tablet, Take 5 mg by  mouth daily., Disp: , Rfl:    Cholecalciferol (VITAMIN D-3) 25 MCG (1000 UT) CAPS, Take 1,000 Units by mouth daily., Disp: , Rfl:    citalopram (CELEXA) 10 MG tablet, Take 10 mg by mouth daily., Disp: , Rfl:    dupilumab (DUPIXENT) 300 MG/2ML prefilled syringe, Inject 300 mg into the skin every 14 (fourteen) days., Disp: , Rfl:    Eyelid Cleansers (OCUSOFT LID SCRUB ORIGINAL) PADS, Place 1 application  into both eyes daily., Disp: , Rfl:    famotidine (PEPCID) 20 MG tablet, Take 20 mg by mouth at bedtime., Disp: , Rfl:    Fluticasone-Umeclidin-Vilant 100-62.5-25 MCG/ACT AEPB, Inhale 1 puff into the lungs daily., Disp: , Rfl:    gabapentin (NEURONTIN) 800 MG tablet, Take 800 mg by mouth 2 (two) times daily. , Disp: , Rfl:    guaiFENesin (MUCINEX) 600 MG 12 hr tablet, Take by mouth 2 (two) times daily., Disp: , Rfl:    hydrocortisone (ANUSOL-HC) 25 MG suppository, Place 25 mg rectally every 4 (four) hours as needed for hemorrhoids or anal itching., Disp: , Rfl:    hydrOXYzine (ATARAX/VISTARIL) 25 MG tablet, Take 25 mg by mouth every 6 (six) hours as needed for itching., Disp: , Rfl:    lactase (LACTAID) 3000 units tablet, Take 3,000 Units by mouth 3 (three) times daily with meals., Disp: , Rfl:    levothyroxine (SYNTHROID) 75 MCG tablet, Take 75 mcg by mouth daily before breakfast., Disp: , Rfl:    Lido-PE-Glycerin-Petrolatum (PREPARATION H RAPID RELIEF RE), Place 1 suppository rectally every 4 (four) hours as needed (hemorrhoids)., Disp: , Rfl:    lidocaine 4 %, Place 1 patch onto the skin daily as needed (pain). (Remove after 12 hours), Disp: , Rfl:    loperamide (IMODIUM) 2 MG capsule, Take 2 mg by mouth every 4 (four) hours as needed for diarrhea or loose stools. (Max 16mg  daily), Disp: , Rfl:    melatonin 5 MG TABS, Take 10 mg by mouth at bedtime., Disp: , Rfl:    Menthol, Topical Analgesic, (BIOFREEZE) 4 % GEL, Apply 1 application  topically 3 (three) times daily. (Apply to neck), Disp: , Rfl:     methocarbamol (ROBAXIN) 500 MG tablet, Take 1,000 mg by mouth 2 (two) times daily., Disp: , Rfl:    oxybutynin (DITROPAN) 5 MG tablet, Take 5 mg by mouth 2 (two) times daily., Disp: , Rfl:    pantoprazole (PROTONIX) 40 MG tablet, Take 1 tablet (40 mg total) by mouth 2 (two) times daily before a meal. For 8 weeks. (Patient taking differently: Take 40 mg by mouth daily.), Disp: , Rfl: 2   polyethylene glycol (MIRALAX / GLYCOLAX) 17 g packet, Take 17 g by mouth daily as needed for moderate constipation., Disp: , Rfl:    Propylene Glycol 0.6 % SOLN, Place 2 drops into both eyes in the morning, at noon, and at bedtime., Disp: ,  Rfl:    psyllium (METAMUCIL SMOOTH TEXTURE) 58.6 % powder, Take 1 packet by mouth 3 (three) times daily., Disp: 283 g, Rfl: 1   selenium sulfide (SELSUN) 1 % LOTN, Apply 1 Application topically 2 (two) times a week. (Tuesday and Friday), Disp: , Rfl:    senna (SENOKOT) 8.6 MG TABS tablet, Take 1 tablet (8.6 mg total) by mouth daily as needed for mild constipation., Disp: 120 tablet, Rfl: 0   silver sulfADIAZINE (SILVADENE) 1 % cream, Apply 1 Application topically daily. (Apply to arms and face), Disp: , Rfl:    sodium chloride (OCEAN) 0.65 % SOLN nasal spray, Place 2 sprays into both nostrils every 6 (six) hours as needed for congestion., Disp: , Rfl:    tacrolimus (PROTOPIC) 0.03 % ointment, Apply 1 application  topically daily. (Apply to eyelids), Disp: , Rfl:    terbinafine (LAMISIL) 1 % cream, Apply 1 Application topically at bedtime. (Apply to feet), Disp: , Rfl:    tiZANidine (ZANAFLEX) 2 MG tablet, Take 2 mg by mouth 2 (two) times daily., Disp: , Rfl:    acetic acid 0.25 % irrigation, Irrigate with 1 Application as directed every 12 (twelve) hours. (Flush foley catheter), Disp: , Rfl:    Albuterol Sulfate (PROAIR RESPICLICK) 108 (90 Base) MCG/ACT AEPB, Inhale 2 puffs into the lungs every 4 (four) hours as needed (COPD)., Disp: , Rfl:    Dextromethorphan-guaiFENesin (COUGH  & CHEST CONGESTION DM) 5-50 MG/5ML SYRP, Take 10 mLs by mouth every 4 (four) hours as needed (cough)., Disp: , Rfl:    Emollient (CETAPHIL) cream, Apply 1 Application topically 2 (two) times daily as needed., Disp: , Rfl:    Eyelid Cleansers (OCUSOFT LID SCRUB ORIGINAL) PADS, Place 1 Pad into both eyes every 12 (twelve) hours as needed (eyelid cleansing)., Disp: , Rfl:    fluticasone (FLONASE) 50 MCG/ACT nasal spray, Place 2 sprays into both nostrils daily., Disp: , Rfl:   Social History: Social History   Tobacco Use   Smoking status: Former    Current packs/day: 0.00    Average packs/day: 0.5 packs/day for 10.0 years (5.0 ttl pk-yrs)    Types: Cigarettes    Start date: 5    Quit date: 2006    Years since quitting: 18.6   Smokeless tobacco: Never  Vaping Use   Vaping status: Never Used  Substance Use Topics   Alcohol use: Not Currently   Drug use: Not Currently    Types: "Crack" cocaine    Comment: quit "crack" 15 years ago--11/04/2019    Family Medical History: Family History  Problem Relation Age of Onset   Healthy Son    Healthy Son     Physical Examination: Vitals:   11/28/22 1412  BP: 130/82    General: Patient is in no apparent distress. Attention to examination is appropriate.  Neck:   Limited range of motion Respiratory: Patient speaks approximately 1 sentence before having to breathe.  He gets short of breath easily.  NEUROLOGICAL:     Awake, alert, oriented to person, place, and time.  Speech is clear and fluent.   Cranial Nerves: Pupils equal round and reactive to light.  Facial tone is symmetric.  Facial sensation is symmetric. Shoulder shrug is symmetric. Tongue protrusion is midline.  There is no pronator drift.  Strength: Side Biceps Triceps Deltoid Interossei Grip Wrist Ext. Wrist Flex.  R 5 5 4+ 5 5 5 5   L 5 5 4+ - 4 - -   Side Iliopsoas Quads  Hamstring PF DF EHL  R 2 3 4- 5 5 5   L 3 3 3 1 1 1    Reflexes are 2+ and symmetric at the biceps,  triceps, brachioradialis, 3+patella and 1+achilles.   Hoffman's is absent.   Bilateral upper and lower extremity sensation is symmetric, but he has limited light touch sensation below the waist bilaterally   Gait is untested due to extreme immobility and imbalance   Medical Decision Making  Imaging: MRI CT spine 08/23/2022 IMPRESSION: 1. Thoracic spondylosis appears worst at T10-11 where there is severe central canal and bilateral foraminal narrowing. The cord is compressed and edematous at this level. 2. No change in the appearance of cervical spondylosis and postoperative change. Degenerative change is most notable at C1-2 where there is mild cord as described above. 3. 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 4. Marked right foraminal narrowing at T3-4. Moderate to moderately severe bilateral foraminal narrowing at T1-2, T5-6 and T11-12.   These results will be called to the ordering clinician or representative by the Radiologist Assistant, and communication documented in the PACS or [employer].     Electronically Signed   By: Drusilla Kanner M.D.   On: 08/23/2022 11:40 I have personally reviewed the images and agree with the above interpretation.  Assessment and Plan: Mr. Kithcart is a pleasant 77 y.o. male with a combination of cervical and thoracic myelopathy.  He has severe thoracic stenosis at T10-11 and T5-6.  He has not walked in approximately 1 year.  He cannot feel his legs.  He already has severe bowel and bladder dysfunction.  He additionally has COPD and has shortness of breath even during this examination.  Though he has clear thoracic myelopathy, I would not recommend surgical intervention.  I do not think he has a reasonable expectation of benefit from surgery, and is at heightened risk for any surgical intervention.  As such, I do not think surgical intervention is indicated at this time due to his elevated risk with  little to no perceived benefit.  Unfortunately, he has not walked in quite some time.  I will see him back on an as-needed basis.  He expressed understanding of my advice.  I think it is reasonable for him to continue with physical therapy if it helps him.  He will continue with his current pain management.    Thank you for involving me in the care of this patient.      Hevin Jeffcoat K. Myer Haff MD, [facility] Neurosurgery

## 2022-11-26 NOTE — Telephone Encounter (Signed)
Joni Reining called in from the [place] checking to make sure patient attend the appointment.

## 2022-11-28 ENCOUNTER — Ambulatory Visit: Payer: No Typology Code available for payment source | Admitting: Neurosurgery

## 2022-11-28 ENCOUNTER — Encounter: Payer: Self-pay | Admitting: Neurosurgery

## 2022-11-28 VITALS — BP 130/82 | Ht 67.0 in | Wt 185.0 lb

## 2022-11-28 DIAGNOSIS — M4714 Other spondylosis with myelopathy, thoracic region: Secondary | ICD-10-CM

## 2022-11-28 DIAGNOSIS — G959 Disease of spinal cord, unspecified: Secondary | ICD-10-CM

## 2022-11-28 DIAGNOSIS — M4804 Spinal stenosis, thoracic region: Secondary | ICD-10-CM

## 2022-12-06 ENCOUNTER — Other Ambulatory Visit: Payer: Self-pay

## 2022-12-06 ENCOUNTER — Inpatient Hospital Stay
Admission: EM | Admit: 2022-12-06 | Discharge: 2022-12-09 | DRG: 390 | Disposition: A | Discharge status: Skilled Nursing Facility | Payer: No Typology Code available for payment source | Source: Skilled Nursing Facility | Attending: Internal Medicine | Admitting: Internal Medicine

## 2022-12-06 ENCOUNTER — Emergency Department: Payer: No Typology Code available for payment source

## 2022-12-06 DIAGNOSIS — E785 Hyperlipidemia, unspecified: Secondary | ICD-10-CM | POA: Diagnosis present

## 2022-12-06 DIAGNOSIS — Z7901 Long term (current) use of anticoagulants: Secondary | ICD-10-CM

## 2022-12-06 DIAGNOSIS — D649 Anemia, unspecified: Secondary | ICD-10-CM | POA: Diagnosis present

## 2022-12-06 DIAGNOSIS — E860 Dehydration: Secondary | ICD-10-CM | POA: Diagnosis present

## 2022-12-06 DIAGNOSIS — Z66 Do not resuscitate: Secondary | ICD-10-CM | POA: Diagnosis present

## 2022-12-06 DIAGNOSIS — K567 Ileus, unspecified: Secondary | ICD-10-CM | POA: Diagnosis not present

## 2022-12-06 DIAGNOSIS — E1169 Type 2 diabetes mellitus with other specified complication: Secondary | ICD-10-CM

## 2022-12-06 DIAGNOSIS — E039 Hypothyroidism, unspecified: Secondary | ICD-10-CM | POA: Diagnosis present

## 2022-12-06 DIAGNOSIS — K649 Unspecified hemorrhoids: Secondary | ICD-10-CM | POA: Diagnosis present

## 2022-12-06 DIAGNOSIS — E1142 Type 2 diabetes mellitus with diabetic polyneuropathy: Secondary | ICD-10-CM | POA: Diagnosis present

## 2022-12-06 DIAGNOSIS — I480 Paroxysmal atrial fibrillation: Secondary | ICD-10-CM | POA: Diagnosis present

## 2022-12-06 DIAGNOSIS — Z96643 Presence of artificial hip joint, bilateral: Secondary | ICD-10-CM | POA: Diagnosis present

## 2022-12-06 DIAGNOSIS — E1140 Type 2 diabetes mellitus with diabetic neuropathy, unspecified: Secondary | ICD-10-CM | POA: Diagnosis present

## 2022-12-06 DIAGNOSIS — F419 Anxiety disorder, unspecified: Secondary | ICD-10-CM | POA: Diagnosis present

## 2022-12-06 DIAGNOSIS — Z79899 Other long term (current) drug therapy: Secondary | ICD-10-CM

## 2022-12-06 DIAGNOSIS — J449 Chronic obstructive pulmonary disease, unspecified: Secondary | ICD-10-CM | POA: Diagnosis present

## 2022-12-06 DIAGNOSIS — Z87891 Personal history of nicotine dependence: Secondary | ICD-10-CM

## 2022-12-06 DIAGNOSIS — K5900 Constipation, unspecified: Secondary | ICD-10-CM

## 2022-12-06 DIAGNOSIS — R21 Rash and other nonspecific skin eruption: Secondary | ICD-10-CM | POA: Diagnosis present

## 2022-12-06 DIAGNOSIS — I10 Essential (primary) hypertension: Secondary | ICD-10-CM | POA: Diagnosis present

## 2022-12-06 DIAGNOSIS — Z79890 Hormone replacement therapy (postmenopausal): Secondary | ICD-10-CM

## 2022-12-06 LAB — CBC WITH DIFFERENTIAL/PLATELET
Abs Immature Granulocytes: 0.03 10*3/uL (ref 0.00–0.07)
Basophils Absolute: 0.1 10*3/uL (ref 0.0–0.1)
Basophils Relative: 1 %
Eosinophils Absolute: 0.4 10*3/uL (ref 0.0–0.5)
Eosinophils Relative: 5 %
HCT: 38.4 % — ABNORMAL LOW (ref 39.0–52.0)
Hemoglobin: 12.2 g/dL — ABNORMAL LOW (ref 13.0–17.0)
Immature Granulocytes: 0 %
Lymphocytes Relative: 16 %
Lymphs Abs: 1.4 10*3/uL (ref 0.7–4.0)
MCH: 26.7 pg (ref 26.0–34.0)
MCHC: 31.8 g/dL (ref 30.0–36.0)
MCV: 84 fL (ref 80.0–100.0)
Monocytes Absolute: 0.8 10*3/uL (ref 0.1–1.0)
Monocytes Relative: 10 %
Neutro Abs: 5.8 10*3/uL (ref 1.7–7.7)
Neutrophils Relative %: 68 %
Platelets: 318 10*3/uL (ref 150–400)
RBC: 4.57 MIL/uL (ref 4.22–5.81)
RDW: 17.1 % — ABNORMAL HIGH (ref 11.5–15.5)
WBC: 8.6 10*3/uL (ref 4.0–10.5)
nRBC: 0 % (ref 0.0–0.2)

## 2022-12-06 LAB — COMPREHENSIVE METABOLIC PANEL
ALT: 5 U/L (ref 0–44)
AST: 24 U/L (ref 15–41)
Albumin: 3.6 g/dL (ref 3.5–5.0)
Alkaline Phosphatase: 104 U/L (ref 38–126)
Anion gap: 12 (ref 5–15)
BUN: 38 mg/dL — ABNORMAL HIGH (ref 8–23)
CO2: 26 mmol/L (ref 22–32)
Calcium: 9.1 mg/dL (ref 8.9–10.3)
Chloride: 97 mmol/L — ABNORMAL LOW (ref 98–111)
Creatinine, Ser: 1.22 mg/dL (ref 0.61–1.24)
GFR, Estimated: >60 mL/min (ref >60)
Glucose, Bld: 115 mg/dL — ABNORMAL HIGH (ref 70–99)
Potassium: 4.4 mmol/L (ref 3.5–5.1)
Sodium: 135 mmol/L (ref 135–145)
Total Bilirubin: 1.3 mg/dL — ABNORMAL HIGH (ref 0.3–1.2)
Total Protein: 7.5 g/dL (ref 6.5–8.1)

## 2022-12-06 MED ORDER — TRAZODONE HCL 50 MG PO TABS: 25.0000 mg | ORAL_TABLET | Freq: Every evening | ORAL | Status: DC | PRN

## 2022-12-06 MED ORDER — ENOXAPARIN SODIUM 40 MG/0.4ML IJ SOSY
40.0000 mg | PREFILLED_SYRINGE | INTRAMUSCULAR | Status: DC
  Administered 2022-12-07 – 2022-12-09 (×3): 40 mg via SUBCUTANEOUS
  Filled 2022-12-06 (×3): qty 0.4

## 2022-12-06 MED ORDER — FLEET ENEMA RE ENEM: 1.0000 | ENEMA | Freq: Once | RECTAL | Status: DC | PRN

## 2022-12-06 MED ORDER — SORBITOL 70 % SOLN
300.0000 mL | TOPICAL_OIL | Freq: Once | ORAL | Status: DC
  Filled 2022-12-06: qty 90

## 2022-12-06 MED ORDER — SORBITOL 70 % SOLN: 30.0000 mL | Freq: Every day | Status: DC | PRN

## 2022-12-06 MED ORDER — LACTULOSE 10 GM/15ML PO SOLN
10.0000 g | Freq: Once | ORAL | Status: DC
  Filled 2022-12-06: qty 30

## 2022-12-06 MED ORDER — MAGNESIUM CITRATE PO SOLN
1.0000 | Freq: Once | ORAL | Status: AC
  Administered 2022-12-06: 1 via ORAL
  Filled 2022-12-06: qty 296

## 2022-12-06 MED ORDER — POLYETHYLENE GLYCOL 3350 17 G PO PACK: 17.0000 g | PACK | Freq: Every day | ORAL | Status: DC | PRN

## 2022-12-06 MED ORDER — SODIUM CHLORIDE 0.9 % IV SOLN: INTRAVENOUS | Status: DC

## 2022-12-06 MED ORDER — ACETAMINOPHEN 650 MG RE SUPP: 650.0000 mg | Freq: Four times a day (QID) | RECTAL | Status: DC | PRN

## 2022-12-06 MED ORDER — METOPROLOL TARTRATE 5 MG/5ML IV SOLN
5.0000 mg | Freq: Once | INTRAVENOUS | Status: AC
  Administered 2022-12-07: 5 mg via INTRAVENOUS
  Filled 2022-12-06: qty 5

## 2022-12-06 MED ORDER — ACETAMINOPHEN 325 MG PO TABS: 650.0000 mg | ORAL_TABLET | Freq: Four times a day (QID) | ORAL | Status: DC | PRN

## 2022-12-06 NOTE — ED Provider Notes (Signed)
[facility] Provider Note    Event Date/Time   First MD Initiated Contact with Patient 12/06/22 1701     (approximate)   History   Abdominal Pain   HPI  Raymond Hays is a 77 y.o. male with history of atrial fibrillation, on blood thinners, diabetes, spinal stenosis, indwelling Foley catheter who presents with abdominal distention.  X-ray performed at skilled nursing facility demonstrated ileus, referred to the emergency department.  Describes mild abdominal discomfort but no pain at this time     Physical Exam   Triage Vital Signs: ED Triage Vitals  Encounter Vitals Group     BP 12/06/22 1701 120/81     Systolic BP Percentile --      Diastolic BP Percentile --      Pulse Rate 12/06/22 1701 (!) 101     Resp 12/06/22 1701 18     Temp 12/06/22 1701 98.1 F (36.7 C)     Temp Source 12/06/22 1701 Oral     SpO2 12/06/22 1701 99 %     Weight --      Height --      Head Circumference --      Peak Flow --      Pain Score 12/06/22 1657 0     Pain Loc --      Pain Education --      Exclude from Growth Chart --     Most recent vital signs: Vitals:   12/06/22 2130 12/06/22 2137  BP: (!) 134/92   Pulse: (!) 109 62  Resp: (!) 21 20  Temp:    SpO2: 95% 95%     General: Awake, no distress.  CV:  Good peripheral perfusion.  Resp:  Normal effort.  Abd:  Positive distention, soft, nontender Other:     ED Results / Procedures / Treatments   Labs (all labs ordered are listed, but only abnormal results are displayed) Labs Reviewed  CBC WITH DIFFERENTIAL/PLATELET - Abnormal; Notable for the following components:      Result Value   Hemoglobin 12.2 (*)    HCT 38.4 (*)    RDW 17.1 (*)    All other components within normal limits  COMPREHENSIVE METABOLIC PANEL - Abnormal; Notable for the following components:   Chloride 97 (*)    Glucose, Bld 115 (*)    BUN 38 (*)    Total Bilirubin 1.3 (*)    All other components within normal limits      EKG     RADIOLOGY KUB demonstrates diffuse stool    PROCEDURES:  Critical Care performed:   Procedures   MEDICATIONS ORDERED IN ED: Medications  lactulose (CHRONULAC) 10 GM/15ML solution 10 g (has no administration in time range)  magnesium citrate solution 1 Bottle (1 Bottle Oral Given 12/06/22 1940)     IMPRESSION / MDM / ASSESSMENT AND PLAN / ED COURSE  I reviewed the triage vital signs and the nursing notes. Patient's presentation is most consistent with acute illness / injury with system symptoms.  Patient presents with distention as above.  KUB confirms diffuse stool in the colon and rectal vault.  No evidence of obstruction and patient has no nausea or vomiting.  Consistent with constipation, ileus  Treated with magnesium citrate, lactulose in the emergency department, had enema prior to arrival, had suppository prior to arrival without improvement, have discussed with the hospitalist for admission        FINAL CLINICAL IMPRESSION(S) / ED DIAGNOSES  Final diagnoses:  Ileus (HCC)     Rx / DC Orders   ED Discharge Orders     None        Note:  This document was prepared using Dragon voice recognition software and may include unintentional dictation errors.   Jene Every, MD 12/06/22 [phone]

## 2022-12-06 NOTE — ED Triage Notes (Signed)
[facility] reports pt coming from [facility] for an abdominal ileus, scan was done on the 3rd of this month. Pt is denying any pain.

## 2022-12-07 ENCOUNTER — Observation Stay: Payer: No Typology Code available for payment source

## 2022-12-07 DIAGNOSIS — R21 Rash and other nonspecific skin eruption: Secondary | ICD-10-CM | POA: Diagnosis present

## 2022-12-07 DIAGNOSIS — Z87891 Personal history of nicotine dependence: Secondary | ICD-10-CM | POA: Diagnosis not present

## 2022-12-07 DIAGNOSIS — Z96643 Presence of artificial hip joint, bilateral: Secondary | ICD-10-CM | POA: Diagnosis present

## 2022-12-07 DIAGNOSIS — I480 Paroxysmal atrial fibrillation: Secondary | ICD-10-CM | POA: Diagnosis present

## 2022-12-07 DIAGNOSIS — D649 Anemia, unspecified: Secondary | ICD-10-CM | POA: Diagnosis present

## 2022-12-07 DIAGNOSIS — F419 Anxiety disorder, unspecified: Secondary | ICD-10-CM | POA: Diagnosis present

## 2022-12-07 DIAGNOSIS — Z66 Do not resuscitate: Secondary | ICD-10-CM | POA: Diagnosis present

## 2022-12-07 DIAGNOSIS — E1140 Type 2 diabetes mellitus with diabetic neuropathy, unspecified: Secondary | ICD-10-CM | POA: Diagnosis present

## 2022-12-07 DIAGNOSIS — E785 Hyperlipidemia, unspecified: Secondary | ICD-10-CM | POA: Diagnosis present

## 2022-12-07 DIAGNOSIS — K5900 Constipation, unspecified: Secondary | ICD-10-CM

## 2022-12-07 DIAGNOSIS — J449 Chronic obstructive pulmonary disease, unspecified: Secondary | ICD-10-CM | POA: Diagnosis present

## 2022-12-07 DIAGNOSIS — Z79899 Other long term (current) drug therapy: Secondary | ICD-10-CM | POA: Diagnosis not present

## 2022-12-07 DIAGNOSIS — Z7901 Long term (current) use of anticoagulants: Secondary | ICD-10-CM | POA: Diagnosis not present

## 2022-12-07 DIAGNOSIS — E860 Dehydration: Secondary | ICD-10-CM | POA: Diagnosis present

## 2022-12-07 DIAGNOSIS — E039 Hypothyroidism, unspecified: Secondary | ICD-10-CM | POA: Diagnosis present

## 2022-12-07 DIAGNOSIS — I10 Essential (primary) hypertension: Secondary | ICD-10-CM | POA: Diagnosis present

## 2022-12-07 DIAGNOSIS — K649 Unspecified hemorrhoids: Secondary | ICD-10-CM | POA: Diagnosis present

## 2022-12-07 DIAGNOSIS — Z79890 Hormone replacement therapy (postmenopausal): Secondary | ICD-10-CM | POA: Diagnosis not present

## 2022-12-07 DIAGNOSIS — K567 Ileus, unspecified: Secondary | ICD-10-CM | POA: Diagnosis present

## 2022-12-07 LAB — BASIC METABOLIC PANEL
Anion gap: 10 (ref 5–15)
BUN: 38 mg/dL — ABNORMAL HIGH (ref 8–23)
CO2: 28 mmol/L (ref 22–32)
Calcium: 9 mg/dL (ref 8.9–10.3)
Chloride: 99 mmol/L (ref 98–111)
Creatinine, Ser: 1.05 mg/dL (ref 0.61–1.24)
GFR, Estimated: >60 mL/min (ref >60)
Glucose, Bld: 136 mg/dL — ABNORMAL HIGH (ref 70–99)
Potassium: 4.1 mmol/L (ref 3.5–5.1)
Sodium: 137 mmol/L (ref 135–145)

## 2022-12-07 MED ORDER — LEVOTHYROXINE SODIUM 50 MCG PO TABS
75.0000 ug | ORAL_TABLET | Freq: Every day | ORAL | Status: DC
  Administered 2022-12-07 – 2022-12-09 (×3): 75 ug via ORAL
  Filled 2022-12-07 (×3): qty 2

## 2022-12-07 MED ORDER — FUROSEMIDE 10 MG/ML IJ SOLN
40.0000 mg | Freq: Once | INTRAMUSCULAR | Status: AC
  Administered 2022-12-07: 40 mg via INTRAVENOUS
  Filled 2022-12-07: qty 4

## 2022-12-07 MED ORDER — GABAPENTIN 400 MG PO CAPS
400.0000 mg | ORAL_CAPSULE | Freq: Three times a day (TID) | ORAL | Status: DC
  Administered 2022-12-07 – 2022-12-09 (×7): 400 mg via ORAL
  Filled 2022-12-07 (×7): qty 1

## 2022-12-07 MED ORDER — CARBIDOPA-LEVODOPA 25-100 MG PO TABS
1.0000 | ORAL_TABLET | Freq: Three times a day (TID) | ORAL | Status: DC
  Administered 2022-12-07 – 2022-12-09 (×7): 1 via ORAL
  Filled 2022-12-07 (×7): qty 1

## 2022-12-07 MED ORDER — METOPROLOL TARTRATE 5 MG/5ML IV SOLN
5.0000 mg | Freq: Three times a day (TID) | INTRAVENOUS | Status: DC | PRN
  Administered 2022-12-08: 5 mg via INTRAVENOUS
  Filled 2022-12-07: qty 5

## 2022-12-07 MED ORDER — ATORVASTATIN CALCIUM 20 MG PO TABS
20.0000 mg | ORAL_TABLET | Freq: Every day | ORAL | Status: DC
  Administered 2022-12-07 – 2022-12-08 (×2): 20 mg via ORAL
  Filled 2022-12-07 (×2): qty 1

## 2022-12-07 MED ORDER — IPRATROPIUM-ALBUTEROL 0.5-2.5 (3) MG/3ML IN SOLN
3.0000 mL | RESPIRATORY_TRACT | Status: DC | PRN
  Administered 2022-12-07 – 2022-12-08 (×2): 3 mL via RESPIRATORY_TRACT
  Filled 2022-12-07 (×2): qty 3

## 2022-12-07 NOTE — Assessment & Plan Note (Signed)
-   Continue Synthroid and check TSH.

## 2022-12-07 NOTE — Progress Notes (Signed)
PROGRESS NOTE    Raymond Hays  NGE:[id number] DOB: Mar 11, 1946 DOA: 12/06/2022 PCP: Pearson Grippe, MD   Assessment & Plan:   Principal Problem:   Ileus [facility]) Active Problems:   Paroxysmal atrial fibrillation with RVR (HCC)   Dehydration   Hypothyroidism   Type 2 diabetes mellitus with diabetic neuropathy, without long-term current use of insulin (HCC)   Constipation  Assessment and Plan: Ileus: multiple BMs today. Started back on po diet. Repeat XR is still showing ileus.     PAF: w/ RVR. Holding eliquis. IV lopressor prn    Dehydration: continue on IVFs   Hypothyroidism: continue on levothyroxine    DM2: likely poorly controlled. Continue on SSI w/ accuchecks  Peripheral neuropathy: continue on gabapentin  HLD: continue on statin  Depression: severity unknown. Holding home dose of citalopram secondary prolong QTc    DVT prophylaxis: lovenox  Code Status: DNR Family Communication: called pt's son, Desmund, no answer & unable to leave a voicemail  Disposition Plan: likely d/c back to home facility   Level of care: Telemetry Medical Status is: Observation The patient remains OBS appropriate and will d/c before 2 midnights.    Consultants:    Procedures:   Antimicrobials:   Subjective: Pt c/o multiple bowel movements today   Objective: Vitals:   12/06/22 2300 12/07/22 0024 12/07/22 0105 12/07/22 0808  BP: (!) 134/98 126/88 101/87 (!) 128/104  Pulse: 77 (!) 102 (!) 108 99  Resp: 18 19 18 18   Temp:   (!) 97.1 F (36.2 C) 98.5 F (36.9 C)  TempSrc:   Oral Oral  SpO2: 95% 94% 94% (!) 78%  Weight:   97.8 kg   Height:   5' 7.5" (1.715 m)     Intake/Output Summary (Last 24 hours) at 12/07/2022 0814 Last data filed at 12/07/2022 4010 Gross per 24 hour  Intake 356.37 ml  Output --  Net 356.37 ml   Filed Weights   12/07/22 0105  Weight: 97.8 kg    Examination:  General exam: Appears calm and comfortable  Respiratory system: Clear to auscultation.  Respiratory effort normal. Cardiovascular system: S1 & S2+. No rubs, gallops or clicks.  Gastrointestinal system: Abdomen is obese, soft and nontender. Hypoactive bowel sounds heard. Central nervous system: Alert and oriented.  Psychiatry: Judgement and insight appear normal. Mood & affect appropriate.     Data Reviewed: I have personally reviewed following labs and imaging studies  CBC: Recent Labs  Lab 12/06/22 1713  WBC 8.6  NEUTROABS 5.8  HGB 12.2*  HCT 38.4*  MCV 84.0  PLT 318   Basic Metabolic Panel: Recent Labs  Lab 12/06/22 1713 12/07/22 0504  NA 135 137  K 4.4 4.1  CL 97* 99  CO2 26 28  GLUCOSE 115* 136*  BUN 38* 38*  CREATININE 1.22 1.05  CALCIUM 9.1 9.0   GFR: Estimated Creatinine Clearance: 65.2 mL/min (by C-G formula based on SCr of 1.05 mg/dL). Liver Function Tests: Recent Labs  Lab 12/06/22 1713  AST 24  ALT 5  ALKPHOS 104  BILITOT 1.3*  PROT 7.5  ALBUMIN 3.6   No results for input(s): "LIPASE", "AMYLASE" in the last 168 hours. No results for input(s): "AMMONIA" in the last 168 hours. Coagulation Profile: No results for input(s): "INR", "PROTIME" in the last 168 hours. Cardiac Enzymes: No results for input(s): "CKTOTAL", "CKMB", "CKMBINDEX", "TROPONINI" in the last 168 hours. BNP (last 3 results) No results for input(s): "PROBNP" in the last 8760 hours. HbA1C: No results  for input(s): "HGBA1C" in the last 72 hours. CBG: No results for input(s): "GLUCAP" in the last 168 hours. Lipid Profile: No results for input(s): "CHOL", "HDL", "LDLCALC", "TRIG", "CHOLHDL", "LDLDIRECT" in the last 72 hours. Thyroid Function Tests: No results for input(s): "TSH", "T4TOTAL", "FREET4", "T3FREE", "THYROIDAB" in the last 72 hours. Anemia Panel: No results for input(s): "VITAMINB12", "FOLATE", "FERRITIN", "TIBC", "IRON", "RETICCTPCT" in the last 72 hours. Sepsis Labs: No results for input(s): "PROCALCITON", "LATICACIDVEN" in the last 168 hours.  No  results found for this or any previous visit (from the past 240 hour(s)).       Radiology Studies: DG Abdomen 1 View  Result Date: 12/06/2022 CLINICAL DATA:  Abdominal distension. EXAM: ABDOMEN - 1 VIEW COMPARISON:  Abdominal radiograph dated 04/08/2021 and CT dated 09/29/2022. FINDINGS: There is large amount of stool throughout the colon and in the rectal vault. There is air distension of redundant sigmoid colon. Mildly dilated air filled small bowel loops measure up to approximately 4 cm in caliber. No free air. Osteopenia with degenerative changes of the spine. Bilateral total hip arthroplasties. No acute osseous pathology. IMPRESSION: Large amount of stool throughout the colon and in the rectal vault. Electronically Signed   By: Elgie Collard M.D.   On: 12/06/2022 19:15        Scheduled Meds:  enoxaparin (LOVENOX) injection  40 mg Subcutaneous Q24H   lactulose  10 g Oral Once   sorbitol, milk of mag, mineral oil, glycerin (SMOG) enema  300 mL Rectal Once   Continuous Infusions:  sodium chloride Stopped (12/07/22 0641)     LOS: 0 days       Charise Killian, MD [facility] Pager [phone]  If 7PM-7AM, please contact night-coverage [email or web address] 12/07/2022, 8:14 AM

## 2022-12-07 NOTE — TOC Initial Note (Addendum)
Transition of Care [facility]) - Initial/Assessment Note    Patient Details  Name: Raymond Hays MRN: [id number] Date of Birth: [birth year]  Transition of Care [facility]) CM/SW Contact:    Liliana Cline, LCSW Phone Number: 12/07/2022, 9:29 AM  Clinical Narrative:                 Patient is from [facility] long term care. TOC will continue to follow for dishcarge planning assistance.   2:46- Per MD, patient should be medically stable to return to [facility] tomorrow. CSW attempted call to Gavin Pound at [facility] to confirm patient can come back, left VM requesting a return call.   Expected Discharge Plan: Skilled Nursing Facility Barriers to Discharge: Continued Medical Work up   Patient Goals and CMS Choice            Expected Discharge Plan and Services       Living arrangements for the past 2 months: Skilled Nursing Facility                                      Prior Living Arrangements/Services Living arrangements for the past 2 months: Skilled Nursing Facility Lives with:: Facility Resident                   Activities of Daily Living      Permission Sought/Granted                  Emotional Assessment              Admission diagnosis:  Ileus [facility]) [K56.7] Patient Active Problem List   Diagnosis Date Noted   Dehydration 12/07/2022   Paroxysmal atrial fibrillation with RVR (HCC) 12/07/2022   Constipation 12/07/2022   Thoracic spondylosis with cord compression (T10) 09/25/2022   Hx of thoracic laminectomy (T11-12) 09/25/2022   Metabolic acidosis 09/15/2022   Aspiration pneumonia of right lower lobe (HCC) 09/15/2022   Complicated UTI (urinary tract infection) 09/14/2022   Hypothyroidism 09/14/2022   Depression with anxiety 09/14/2022   Obesity (BMI 30-39.9) 09/14/2022   Prolonged QT interval 09/14/2022   Chronic indwelling Foley catheter 09/14/2022   Abnormal MRI, thoracic spine (08/23/2022) 09/03/2022   DDD (degenerative disc  disease), cervical 08/05/2022   Neurogenic incontinence 08/05/2022   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) 08/05/2022   Foraminal stenosis of thoracic region 08/05/2022   Thoracic Facet Arthropathy 08/05/2022   Lower extremity numbness (Bilateral) 08/05/2022   Weakness of lower extremity (Bilateral) 08/05/2022   Chronic midline low back pain with left-sided sciatica 08/05/2022   Left foot drop 08/05/2022   Lumbar postlaminectomy syndrome 08/05/2022   Poor memory 08/05/2022   Vitamin B12 deficiency 07/25/2022   Pharmacologic therapy 07/24/2022   Disorder of skeletal system 07/24/2022   Problems influencing health status 07/24/2022   Elevated sed rate 07/24/2022   Elevated C-reactive protein (CRP) 07/24/2022   Chronic anticoagulation (ELIQUIS) 07/24/2022   Chronic neck and back pain (1ry area of Pain) (Bilateral) (L>R) 07/24/2022   Abnormal MRI, cervical spine (08/23/2022) 07/24/2022   History of cervical spinal surgery 07/24/2022   History of lumbar surgery 07/24/2022   History of thoracic surgery 07/24/2022   Laryngopharyngeal reflux 06/17/2022   Throat clearing 06/17/2022   Acute respiratory failure (HCC) 04/03/2021   E. coli UTI    Herpes zoster conjunctivitis of eye (Left)    Severe sepsis (HCC) 03/16/2021  Obstipation 01/30/2021   Anxiety 01/30/2021   Iron deficiency anemia    Edema leg    Respiratory failure (HCC) 01/09/2021   Ileus (HCC)    Neck pain    AKI (acute kidney injury) (HCC)    Type 2 diabetes mellitus with diabetic neuropathy, without long-term current use of insulin (HCC)    Parkinson disease    Sepsis (HCC) 10/16/2020   Status post implantation of mitral valve leaflet clip 06/27/2020   Chronic respiratory failure with hypoxia (HCC) 01/10/2020   Impaired functional mobility, balance, gait, and endurance 09/15/2019   Catheter cystitis (HCC) 10/16/2018   Urinary retention 07/14/2018   COVID-19 virus detected 07/03/2018   Benign prostatic  hyperplasia with lower urinary tract symptoms 08/06/2017   Osteoarthritis of right knee 06/19/2017   S/P orthopedic surgery, follow-up exam 10/25/2016   Tendon rupture of wrist, sequela 01/02/2016   Rotator cuff tear arthropathy of right shoulder 09/28/2015   Acute pain of right knee 08/03/2015   Chronic knee pain (Right) 08/03/2015   Calcific tendinitis of left shoulder 07/05/2015   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/05/2015   Primary osteoarthritis of left shoulder 07/05/2015   Gait difficulty 06/26/2015   Severe mitral regurgitation 06/05/2015   AF (paroxysmal atrial fibrillation) (HCC) 05/31/2015   Chronic pain syndrome 05/31/2015   COPD with acute exacerbation (HCC) 05/31/2015   Essential hypertension 05/31/2015   Atrial fibrillation (HCC) 05/31/2015   COPD exacerbation (HCC) 05/31/2015   Chronic wrist pain (Right) 04/24/2015   Arthritis of left wrist 04/24/2015   Rupture of extensor tendon of left hand 04/24/2015   PCP:  Pearson Grippe, MD Pharmacy:   [facility] - [place], [place] - [address] [place] [address] Phone: [phone] Fax: [phone]  [facility] - [place], [place] - [address] [place] [address] Phone: [phone] Fax: [phone]     Social Determinants of Health (SDOH) Social History: SDOH Screenings   Food Insecurity: No Food Insecurity (09/14/2022)  Housing: Low Risk  (09/14/2022)  Transportation Needs: No Transportation Needs (09/14/2022)  Utilities: Not At Risk (09/14/2022)  Depression (PHQ2-9): Low Risk  (09/25/2022)  Social Connections: Unknown (11/30/2021)   Received from [facility]  Tobacco Use: Medium Risk (12/06/2022)   SDOH Interventions:     Readmission Risk Interventions    01/12/2021    1:08 PM 10/20/2020    8:59 AM  Readmission Risk Prevention Plan  Transportation Screening  Complete Complete  PCP or Specialist Appt within 3-5 Days  Complete  HRI or Home Care Consult  Complete  Palliative Care Screening  Not Applicable  Medication Review (RN Care Manager) Complete Referral to Pharmacy  PCP or Specialist appointment within 3-5 days of discharge Complete   HRI or Home Care Consult Complete   SW Recovery Care/Counseling Consult Complete   Palliative Care Screening Not Applicable   Skilled Nursing Facility Complete

## 2022-12-07 NOTE — H&P (Signed)
[place]   PATIENT NAME: Raymond Hays    MR#:  [id number]  DATE OF BIRTH:  04/29/1945  DATE OF ADMISSION:  12/06/2022  PRIMARY CARE PHYSICIAN: Pearson Grippe, MD   Patient is coming from: [name]  REQUESTING/REFERRING PHYSICIAN: Jene Every, MD  CHIEF COMPLAINT:   Chief Complaint  Patient presents with   Abdominal Pain    HISTORY OF PRESENT ILLNESS:  Raymond Hays is a 77 y.o. Caucasian male with medical history significant for osteoarthritis, paroxysmal atrial fibrillation, COPD, type diabetes mellitus, hypertension, dyslipidemia and spinal stenosis, who presented to the emergency room with onset of abdominal discomfort with associated distention.  The patient has not any big bowel movement per his report for few days and the last time he had a small bowel movement was 2 days ago.  No melena or bright red blood per rectum.  No nausea or vomiting or fever or chills.  No dysuria, oliguria or hematuria or flank pain.  He admits to mild headache.  He has been having mild cough productive of clear sputum without dyspnea or wheezing.  He denies any chest pain or palpitations.  ED Course: When he came to the ER, BP was 141/88 with otherwise normal vital signs and later heart rate was up to 115.  Labs revealed chloride of 97 and BUN of 38 with total bili of 1.3 and otherwise unremarkable CMP.  CBC showed mild anemia EKG as reviewed by me :  EKG showed atrial fibrillation with rapid ventricular sponsor 118 with poor R wave progression. Imaging: Abdomen 1 view x-ray showed large amount of stool throughout the colon in the rectal vault.  The patient was given lactulose, Dulcolax, as well as mag citrate here.  He received 1 Fleet enema at his SNF.  He was still having any bowel movements and persistent discomfort.  He will be admitted to a medical telemetry observation bed for further evaluation and management. PAST MEDICAL HISTORY:   Past Medical History:  Diagnosis Date    Allergic rhinitis    Anxiety    Arthritis    Atrial fibrillation (HCC)    Chronic indwelling Foley catheter    COPD (chronic obstructive pulmonary disease) (HCC)    Cough    Diabetes (HCC)    Essential (primary) hypertension    Hemorrhoid    Hyperlipidemia    PAF (paroxysmal atrial fibrillation) (HCC)    Primary osteoarthritis, unspecified shoulder    Retention of urine, unspecified    Spinal stenosis    UTI (urinary tract infection)     PAST SURGICAL HISTORY:   Past Surgical History:  Procedure Laterality Date   APPENDECTOMY     BACK SURGERY     COLONOSCOPY N/A 10/27/2020   Procedure: COLONOSCOPY;  Surgeon: Wyline Mood, MD;  Location: [facility] ENDOSCOPY;  Service: Gastroenterology;  Laterality: N/A;   ELBOW SURGERY     ESOPHAGOGASTRODUODENOSCOPY (EGD) WITH PROPOFOL N/A 10/27/2020   Procedure: ESOPHAGOGASTRODUODENOSCOPY (EGD) WITH PROPOFOL;  Surgeon: Wyline Mood, MD;  Location: [facility] ENDOSCOPY;  Service: Gastroenterology;  Laterality: N/A;   HERNIA REPAIR     KNEE SURGERY     neck     PARTIAL HIP ARTHROPLASTY Bilateral    TEE WITHOUT CARDIOVERSION N/A 10/19/2020   Procedure: TRANSESOPHAGEAL ECHOCARDIOGRAM (TEE);  Surgeon: Antonieta Iba, MD;  Location: [facility] ORS;  Service: Cardiovascular;  Laterality: N/A;   TONSILLECTOMY      SOCIAL HISTORY:   Social History   Tobacco Use  Smoking status: Former    Current packs/day: 0.00    Average packs/day: 0.5 packs/day for 10.0 years (5.0 ttl pk-yrs)    Types: Cigarettes    Start date: 38    Quit date: 2006    Years since quitting: 18.6   Smokeless tobacco: Never  Substance Use Topics   Alcohol use: Not Currently    FAMILY HISTORY:   Family History  Problem Relation Age of Onset   Healthy Son    Healthy Son     DRUG ALLERGIES:  No Known Allergies  REVIEW OF SYSTEMS:   ROS As per history of present illness. All pertinent systems were reviewed above. Constitutional, HEENT, cardiovascular, respiratory, GI, GU,  musculoskeletal, neuro, psychiatric, endocrine, integumentary and hematologic systems were reviewed and are otherwise negative/unremarkable except for positive findings mentioned above in the HPI.   MEDICATIONS AT HOME:   Prior to Admission medications   Medication Sig Start Date End Date Taking? Authorizing Provider  acetaminophen (TYLENOL) 325 MG tablet Take 650 mg by mouth every 4 (four) hours as needed for mild pain.   Yes [provider]  acetic acid 0.25 % irrigation Irrigate with 1 Application as directed every 12 (twelve) hours. (Flush foley catheter)   Yes [provider]  Albuterol Sulfate (PROAIR RESPICLICK) 108 (90 Base) MCG/ACT AEPB Inhale 2 puffs into the lungs every 4 (four) hours as needed (COPD).   Yes [provider]  allopurinol (ZYLOPRIM) 100 MG tablet Take 100 mg by mouth daily.   Yes [provider]  apixaban (ELIQUIS) 5 MG TABS tablet Take 5 mg by mouth 2 (two) times daily.    Yes [provider]  ascorbic acid (VITAMIN C) 500 MG tablet Take 500 mg by mouth daily.   Yes [provider]  atorvastatin (LIPITOR) 20 MG tablet Take 20 mg by mouth at bedtime.   Yes [provider]  Calcium Carbonate (CALCIUM 500 PO) Take 2 tablets by mouth every 6 (six) hours as needed (indigestion/heartburn).   Yes [provider]  carbidopa-levodopa (SINEMET IR) 25-100 MG tablet Take 1 tablet by mouth 3 (three) times daily before meals.   Yes [provider]  cetirizine (ZYRTEC) 5 MG tablet Take 5 mg by mouth at bedtime.   Yes [provider]  Cholecalciferol (VITAMIN D-3) 25 MCG (1000 UT) CAPS Take 1,000 Units by mouth daily.   Yes [provider]  citalopram (CELEXA) 10 MG tablet Take 10 mg by mouth at bedtime.   Yes [provider]  docusate sodium (COLACE) 100 MG capsule Take 100 mg by mouth daily.   Yes [provider]  Emollient (CETAPHIL) cream Apply 1 Application  topically 2 (two) times daily as needed.   Yes [provider]  Eyelid Cleansers (OCUSOFT LID SCRUB ORIGINAL) PADS Place 1 application  into both eyes daily.   Yes [provider]  Eyelid Cleansers (OCUSOFT LID SCRUB ORIGINAL) PADS Place 1 Pad into both eyes every 12 (twelve) hours as needed (eyelid cleansing).   Yes [provider]  famotidine (PEPCID) 20 MG tablet Take 20 mg by mouth at bedtime.   Yes [provider]  ferrous sulfate 324 (65 Fe) MG TBEC Take 1 tablet by mouth daily. 12/05/22  Yes [provider]  fluticasone (FLONASE) 50 MCG/ACT nasal spray Place 2 sprays into both nostrils daily.   Yes [provider]  Fluticasone-Umeclidin-Vilant 100-62.5-25 MCG/ACT AEPB Inhale 1 puff into the lungs daily.   Yes [provider]  furosemide (LASIX) 20 MG tablet Take 20 mg by mouth daily as needed for edema. 10/21/22  Yes [provider]  gabapentin (NEURONTIN) 800 MG tablet Take 800 mg by mouth 4 (four) times daily.   Yes [provider]  guaifenesin (ROBITUSSIN) 100 MG/5ML syrup Take 200 mg by mouth every 4 (four) hours as needed for cough.   Yes [provider]  hydrocortisone (ANUSOL-HC) 25 MG suppository Place 25 mg rectally every 4 (four) hours as needed for hemorrhoids or anal itching.   Yes [provider]  hydrOXYzine (ATARAX/VISTARIL) 25 MG tablet Take 25 mg by mouth every 6 (six) hours as needed for itching. 02/17/20  Yes [provider]  ipratropium (ATROVENT) 0.06 % nasal spray Place 2 sprays into both nostrils 3 (three) times daily. 11/04/22  Yes [provider]  lactase (LACTAID) 3000 units tablet Take 3,000 Units by mouth 3 (three) times daily with meals.   Yes [provider]  levothyroxine (SYNTHROID) 75 MCG tablet Take 75 mcg by mouth daily before breakfast.   Yes [provider]  lidocaine 4 % Place 1 patch onto the skin daily as needed (pain). (Remove  after 12 hours)   Yes [provider]  loperamide (IMODIUM) 2 MG capsule Take 2 mg by mouth every 4 (four) hours as needed for diarrhea or loose stools. (Max 16mg  daily)   Yes [provider]  melatonin 5 MG TABS Take 10 mg by mouth at bedtime.   Yes [provider]  Menthol, Topical Analgesic, (BIOFREEZE) 4 % GEL Apply 1 application  topically 3 (three) times daily. (Apply to neck)   Yes [provider]  methocarbamol (ROBAXIN) 500 MG tablet Take 1,000 mg by mouth 2 (two) times daily.   Yes [provider]  methocarbamol (ROBAXIN) 500 MG tablet Take 500 mg by mouth daily. At 1400   Yes [provider]  nortriptyline (PAMELOR) 10 MG capsule Take 20 mg by mouth at bedtime. 12/05/22  Yes [provider]  oxybutynin (DITROPAN) 5 MG tablet Take 5 mg by mouth 2 (two) times daily.   Yes [provider]  polyethylene glycol (MIRALAX / GLYCOLAX) 17 g packet Take 17 g by mouth daily.   Yes [provider]  pramoxine (SARNA SENSITIVE) 1 % LOTN Apply 1 Application topically 4 (four) times daily as needed.   Yes [provider]  Probiotic Product (ACIDOPHILUS PROBIOTIC BLEND) CAPS Take 1 capsule by mouth daily. 12/05/22  Yes [provider]  Propylene Glycol 0.6 % SOLN Place 2 drops into both eyes in the morning, at noon, and at bedtime.   Yes [provider]  psyllium (METAMUCIL SMOOTH TEXTURE) 58.6 % powder Take 1 packet by mouth 3 (three) times daily. 09/29/22  Yes Chesley Noon, MD  selenium sulfide (SELSUN) 1 % LOTN Apply 1 Application topically 2 (two) times a week. (Tuesday and Friday)   Yes [provider]  senna (SENOKOT) 8.6 MG TABS tablet Take 1 tablet (8.6 mg total) by mouth daily as needed for mild constipation. Patient taking differently: Take 1 tablet by mouth 2 (two) times daily as needed for mild constipation. 09/29/22  Yes Chesley Noon, MD  silver sulfADIAZINE (SILVADENE) 1 %  cream Apply 1 Application topically daily. (Apply to arms and face)   Yes [provider]  sodium chloride (OCEAN) 0.65 % SOLN nasal spray Place 2 sprays into both nostrils every 6 (six) hours as needed for congestion.   Yes [provider]  tacrolimus (PROTOPIC) 0.03 % ointment Apply 1 application  topically daily. (Apply to eyelids)   Yes [provider]  terbinafine (LAMISIL) 1 % cream Apply 1 Application topically at bedtime. (Apply to feet)   Yes [provider]  tiZANidine (ZANAFLEX) 2 MG tablet Take 2 mg by mouth 2 (two) times daily as needed for muscle spasms.   Yes [provider]  triamcinolone cream (KENALOG) 0.1 % Apply 1 Application topically daily as needed. Apply to arms, legs, and back r/t rash   Yes [provider]  albuterol (PROVENTIL) (2.5 MG/3ML) 0.083% nebulizer solution Take 2.5 mg by nebulization every 8 (eight) hours as needed for wheezing or shortness of breath. Patient not taking: Reported on 12/06/2022    [provider]  Dextromethorphan-guaiFENesin (COUGH & CHEST CONGESTION DM) 5-50 MG/5ML SYRP Take 10 mLs by mouth every 4 (four) hours as needed (cough). Patient not taking: Reported on 12/06/2022    [provider]  dupilumab (DUPIXENT) 300 MG/2ML prefilled syringe Inject 300 mg into the skin every 14 (fourteen) days. Patient not taking: Reported on 12/06/2022    [provider]  guaiFENesin (MUCINEX) 600 MG 12 hr tablet Take by mouth 2 (two) times daily. Patient not taking: Reported on 12/06/2022    [provider]  Lido-PE-Glycerin-Petrolatum (PREPARATION H RAPID RELIEF RE) Place 1 suppository rectally every 4 (four) hours as needed (hemorrhoids). Patient not taking: Reported on 12/06/2022    [provider]  pantoprazole (PROTONIX) 40 MG tablet Take 1 tablet (40 mg total) by mouth 2 (two) times daily before a meal. For 8 weeks. Patient not taking: Reported on 12/06/2022 11/01/20    [name], MD      VITAL SIGNS:  Blood pressure 101/87, pulse (!) 108, temperature (!) 97.1 F (36.2 C), temperature source Oral, resp. rate 18, height 5' 7.5" (1.715 m), weight 97.8 kg, SpO2 94%.  PHYSICAL EXAMINATION:  Physical Exam  GENERAL:  77 y.o.-year-old patient lying in the bed with no acute distress.  EYES: Pupils equal, round, reactive to light and accommodation. No scleral icterus. Extraocular muscles intact.  HEENT: Head atraumatic, normocephalic. Oropharynx and nasopharynx clear.  NECK:  Supple, no jugular venous distention. No thyroid enlargement, no tenderness.  LUNGS: Normal breath sounds bilaterally, no wheezing, rales,rhonchi or crepitation. No use of accessory muscles of respiration.  CARDIOVASCULAR: Regular rate and rhythm, S1, S2 normal. No murmurs, rubs, or gallops.  ABDOMEN: Soft, distended with generalized abdominal tenderness without rebound tenderness guarding or rigidity.  He had significantly diminished bowel sounds. No organomegaly or mass.  EXTREMITIES: No pedal edema, cyanosis, or clubbing.  NEUROLOGIC: Cranial nerves II through XII are intact. Muscle strength 5/5 in all extremities. Sensation intact. Gait not checked.  PSYCHIATRIC: The patient is alert and oriented x 3.  Normal affect and good eye contact. SKIN: No obvious rash, lesion, or ulcer.   LABORATORY PANEL:   CBC Recent Labs  Lab 12/06/22 1713  WBC 8.6  HGB 12.2*  HCT 38.4*  PLT 318   ------------------------------------------------------------------------------------------------------------------  Chemistries  Recent Labs  Lab 12/06/22 1713  NA 135  K 4.4  CL 97*  CO2 26  GLUCOSE 115*  BUN 38*  CREATININE 1.22  CALCIUM 9.1  AST 24  ALT 5  ALKPHOS 104  BILITOT 1.3*   ------------------------------------------------------------------------------------------------------------------  Cardiac Enzymes No results for input(s): "TROPONINI" in the last 168  hours. ------------------------------------------------------------------------------------------------------------------  RADIOLOGY:  DG Abdomen 1 View  Result Date: 12/06/2022 CLINICAL DATA:  Abdominal distension. EXAM: ABDOMEN -  1 VIEW COMPARISON:  Abdominal radiograph dated 04/08/2021 and CT dated 09/29/2022. FINDINGS: There is large amount of stool throughout the colon and in the rectal vault. There is air distension of redundant sigmoid colon. Mildly dilated air filled small bowel loops measure up to approximately 4 cm in caliber. No free air. Osteopenia with degenerative changes of the spine. Bilateral total hip arthroplasties. No acute osseous pathology. IMPRESSION: Large amount of stool throughout the colon and in the rectal vault. Electronically Signed   By: Elgie Collard M.D.   On: 12/06/2022 19:15      IMPRESSION AND PLAN:  Assessment and Plan: * Ileus (HCC) - This is likely the culprit for his constipation. - The patient will be admitted to a medical telemetry observation bed. - We will continue hydration with IV normal saline. - Will optimize electrolytes. - His constipation will be aggressively managed.  Paroxysmal atrial fibrillation with RVR (HCC) - He responded to IV Lopressor and hydration. - We will continue Eliquis. - Will check TSH.  Dehydration - We will continue hydration with IV normal saline and follow BMP.  Hypothyroidism - Continue Synthroid and check TSH.  Type 2 diabetes mellitus with diabetic neuropathy, without long-term current use of insulin (HCC) - We will place the patient on supplemental coverage with NovoLog. - We will continue Neurontin.   DVT prophylaxis: Lovenox.  Advanced Care Planning:  Code Status: full code.  Family Communication:  The plan of care was discussed in details with the patient (and family). I answered all questions. The patient agreed to proceed with the above mentioned plan. Further management will depend upon hospital  course. Disposition Plan: Back to previous home environment Consults called: none.  All the records are reviewed and case discussed with ED provider.  Status is: Observation  I certify that at the time of admission, it is my clinical judgment that the patient will require hospital care extending less than 2 midnights.                            Dispo: The patient is from: Home              Anticipated d/c is to: Home              Patient currently is not medically stable to d/c.              Difficult to place patient: No  Hannah Beat M.D on 12/07/2022 at 4:32 AM  [facility]   From 7 PM-7 AM, contact night-coverage [email or web address]  CC: Primary care physician; Pearson Grippe, MD

## 2022-12-07 NOTE — Assessment & Plan Note (Signed)
-   We will place the patient on supplemental coverage with NovoLog. - We will continue Neurontin.

## 2022-12-07 NOTE — Assessment & Plan Note (Addendum)
-   He responded to IV Lopressor and hydration. - We will continue Eliquis. - Will check TSH.

## 2022-12-07 NOTE — Assessment & Plan Note (Signed)
-   We will continue hydration with IV normal saline and follow BMP.

## 2022-12-07 NOTE — NC FL2 (Signed)
[place] MEDICAID FL2 LEVEL OF CARE FORM     IDENTIFICATION  Patient Name: Raymond Hays Birthdate: 1946/02/05 Sex: male Admission Date (Current Location): 12/06/2022  [facility] and [place] Number:  Chiropodist and Address:  [facility], [address], [place], [place] [address]      Provider Number: [id number]  Attending Physician Name and Address:  Charise Killian, MD  Relative Name and Phone Number:  [email or web address],debbie (Sister)  [phone] [facility])    Current Level of Care: Hospital Recommended Level of Care: Skilled Nursing Facility Prior Approval Number:    Date Approved/Denied:   PASRR Number: [id number]  Discharge Plan:      Current Diagnoses: Patient Active Problem List   Diagnosis Date Noted   Dehydration 12/07/2022   Paroxysmal atrial fibrillation with RVR (HCC) 12/07/2022   Constipation 12/07/2022   Thoracic spondylosis with cord compression (T10) 09/25/2022   Hx of thoracic laminectomy (T11-12) 09/25/2022   Metabolic acidosis 09/15/2022   Aspiration pneumonia of right lower lobe (HCC) 09/15/2022   Complicated UTI (urinary tract infection) 09/14/2022   Hypothyroidism 09/14/2022   Depression with anxiety 09/14/2022   Obesity (BMI 30-39.9) 09/14/2022   Prolonged QT interval 09/14/2022   Chronic indwelling Foley catheter 09/14/2022   Abnormal MRI, thoracic spine (08/23/2022) 09/03/2022   DDD (degenerative disc disease), cervical 08/05/2022   Neurogenic incontinence 08/05/2022   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) 08/05/2022   Foraminal stenosis of thoracic region 08/05/2022   Thoracic Facet Arthropathy 08/05/2022   Lower extremity numbness (Bilateral) 08/05/2022   Weakness of lower extremity (Bilateral) 08/05/2022   Chronic midline low back pain with left-sided sciatica 08/05/2022   Left foot drop 08/05/2022   Lumbar postlaminectomy syndrome 08/05/2022   Poor memory 08/05/2022   Vitamin B12 deficiency  07/25/2022   Pharmacologic therapy 07/24/2022   Disorder of skeletal system 07/24/2022   Problems influencing health status 07/24/2022   Elevated sed rate 07/24/2022   Elevated C-reactive protein (CRP) 07/24/2022   Chronic anticoagulation (ELIQUIS) 07/24/2022   Chronic neck and back pain (1ry area of Pain) (Bilateral) (L>R) 07/24/2022   Abnormal MRI, cervical spine (08/23/2022) 07/24/2022   History of cervical spinal surgery 07/24/2022   History of lumbar surgery 07/24/2022   History of thoracic surgery 07/24/2022   Laryngopharyngeal reflux 06/17/2022   Throat clearing 06/17/2022   Acute respiratory failure (HCC) 04/03/2021   E. coli UTI    Herpes zoster conjunctivitis of eye (Left)    Severe sepsis (HCC) 03/16/2021   Obstipation 01/30/2021   Anxiety 01/30/2021   Iron deficiency anemia    Edema leg    Respiratory failure (HCC) 01/09/2021   Ileus (HCC)    Neck pain    AKI (acute kidney injury) (HCC)    Type 2 diabetes mellitus with diabetic neuropathy, without long-term current use of insulin (HCC)    Parkinson disease    Sepsis (HCC) 10/16/2020   Status post implantation of mitral valve leaflet clip 06/27/2020   Chronic respiratory failure with hypoxia (HCC) 01/10/2020   Impaired functional mobility, balance, gait, and endurance 09/15/2019   Catheter cystitis (HCC) 10/16/2018   Urinary retention 07/14/2018   COVID-19 virus detected 07/03/2018   Benign prostatic hyperplasia with lower urinary tract symptoms 08/06/2017   Osteoarthritis of right knee 06/19/2017   S/P orthopedic surgery, follow-up exam 10/25/2016   Tendon rupture of wrist, sequela 01/02/2016   Rotator cuff tear arthropathy of right shoulder 09/28/2015   Acute pain of right knee 08/03/2015  Chronic knee pain (Right) 08/03/2015   Calcific tendinitis of left shoulder 07/05/2015   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/05/2015   Primary osteoarthritis of left shoulder 07/05/2015   Gait difficulty 06/26/2015    Severe mitral regurgitation 06/05/2015   AF (paroxysmal atrial fibrillation) (HCC) 05/31/2015   Chronic pain syndrome 05/31/2015   COPD with acute exacerbation (HCC) 05/31/2015   Essential hypertension 05/31/2015   Atrial fibrillation (HCC) 05/31/2015   COPD exacerbation (HCC) 05/31/2015   Chronic wrist pain (Right) 04/24/2015   Arthritis of left wrist 04/24/2015   Rupture of extensor tendon of left hand 04/24/2015    Orientation RESPIRATION BLADDER Height & Weight     Self, Time, Situation, Place  O2 Indwelling catheter Weight: 215 lb 9.8 oz (97.8 kg) Height:  5' 7.5" (171.5 cm)  BEHAVIORAL SYMPTOMS/MOOD NEUROLOGICAL BOWEL NUTRITION STATUS      Continent Diet (regular)  AMBULATORY STATUS COMMUNICATION OF NEEDS Skin     Verbally Skin abrasions                       Personal Care Assistance Level of Assistance  Bathing, Feeding, Dressing           Functional Limitations Info             SPECIAL CARE FACTORS FREQUENCY  PT (By licensed PT), OT (By licensed OT)     PT Frequency: 5 times per week OT Frequency: 5 times per week            Contractures      Additional Factors Info  Code Status, Allergies Code Status Info: Do not attempt resuscitation (DNR) -DNR-LIMITED -Do Not Intubate/DNI Allergies Info: NKA           Current Medications (12/07/2022):  This is the current hospital active medication list Current Facility-Administered Medications  Medication Dose Route Frequency Provider Last Rate Last Admin   acetaminophen (TYLENOL) tablet 650 mg  650 mg Oral Q6H PRN Mansy, Jan A, MD       Or   acetaminophen (TYLENOL) suppository 650 mg  650 mg Rectal Q6H PRN Mansy, Jan A, MD       atorvastatin (LIPITOR) tablet 20 mg  20 mg Oral QHS Charise Killian, MD       carbidopa-levodopa (SINEMET IR) 25-100 MG per tablet immediate release 1 tablet  1 tablet Oral TID Charise Killian, MD   1 tablet at 12/07/22 1029   enoxaparin (LOVENOX) injection 40 mg  40 mg  Subcutaneous Q24H Mansy, Jan A, MD   40 mg at 12/07/22 1029   gabapentin (NEURONTIN) capsule 400 mg  400 mg Oral TID Charise Killian, MD   400 mg at 12/07/22 1028   ipratropium-albuterol (DUONEB) 0.5-2.5 (3) MG/3ML nebulizer solution 3 mL  3 mL Nebulization Q4H PRN Manuela Schwartz, NP   3 mL at 12/07/22 0145   lactulose (CHRONULAC) 10 GM/15ML solution 10 g  10 g Oral Once Jene Every, MD       levothyroxine (SYNTHROID) tablet 75 mcg  75 mcg Oral Q0600 Charise Killian, MD   75 mcg at 12/07/22 1028   metoprolol tartrate (LOPRESSOR) injection 5 mg  5 mg Intravenous Q8H PRN Charise Killian, MD       polyethylene glycol (MIRALAX / GLYCOLAX) packet 17 g  17 g Oral Daily PRN Mansy, Jan A, MD       sodium phosphate (FLEET) enema 1 enema  1 enema Rectal Once PRN Mansy,  Jan A, MD       sorbitol 70 % solution 30 mL  30 mL Oral Daily PRN Mansy, Jan A, MD       sorbitol, milk of mag, mineral oil, glycerin (SMOG) enema  300 mL Rectal Once Mansy, Jan A, MD       traZODone (DESYREL) tablet 25 mg  25 mg Oral QHS PRN Mansy, Vernetta Honey, MD         Discharge Medications: Please see discharge summary for a list of discharge medications.  Relevant Imaging Results:  Relevant Lab Results:   Additional Information SS #: [id number]  Albertine Lafoy E Seraphine Gudiel, LCSW

## 2022-12-07 NOTE — Assessment & Plan Note (Signed)
-   This is likely the culprit for his constipation. - The patient will be admitted to a medical telemetry observation bed. - We will continue hydration with IV normal saline. - Will optimize electrolytes. - His constipation will be aggressively managed.

## 2022-12-08 ENCOUNTER — Inpatient Hospital Stay: Payer: No Typology Code available for payment source

## 2022-12-08 DIAGNOSIS — K567 Ileus, unspecified: Secondary | ICD-10-CM | POA: Diagnosis not present

## 2022-12-08 LAB — GLUCOSE, CAPILLARY
Glucose-Capillary: 142 mg/dL — ABNORMAL HIGH (ref 70–99)
Glucose-Capillary: 103 mg/dL — ABNORMAL HIGH (ref 70–99)
Glucose-Capillary: 125 mg/dL — ABNORMAL HIGH (ref 70–99)

## 2022-12-08 LAB — BASIC METABOLIC PANEL
Anion gap: 9 (ref 5–15)
BUN: 33 mg/dL — ABNORMAL HIGH (ref 8–23)
CO2: 29 mmol/L (ref 22–32)
Calcium: 8.8 mg/dL — ABNORMAL LOW (ref 8.9–10.3)
Chloride: 98 mmol/L (ref 98–111)
Creatinine, Ser: 1.05 mg/dL (ref 0.61–1.24)
GFR, Estimated: >60 mL/min (ref >60)
Glucose, Bld: 151 mg/dL — ABNORMAL HIGH (ref 70–99)
Potassium: 3.4 mmol/L — ABNORMAL LOW (ref 3.5–5.1)
Sodium: 136 mmol/L (ref 135–145)

## 2022-12-08 LAB — CBC
HCT: 39.3 % (ref 39.0–52.0)
Hemoglobin: 12.2 g/dL — ABNORMAL LOW (ref 13.0–17.0)
MCH: 26.8 pg (ref 26.0–34.0)
MCHC: 31 g/dL (ref 30.0–36.0)
MCV: 86.2 fL (ref 80.0–100.0)
Platelets: 258 10*3/uL (ref 150–400)
RBC: 4.56 MIL/uL (ref 4.22–5.81)
RDW: 17.2 % — ABNORMAL HIGH (ref 11.5–15.5)
WBC: 6.3 10*3/uL (ref 4.0–10.5)
nRBC: 0 % (ref 0.0–0.2)

## 2022-12-08 MED ORDER — INSULIN ASPART 100 UNIT/ML IJ SOLN: 0.0000 [IU] | Freq: Three times a day (TID) | INTRAMUSCULAR | Status: DC

## 2022-12-08 MED ORDER — LACTULOSE 10 GM/15ML PO SOLN: 10.0000 g | Freq: Two times a day (BID) | ORAL | Status: DC | PRN

## 2022-12-08 MED ORDER — POLYETHYLENE GLYCOL 3350 17 G PO PACK: 17.0000 g | PACK | Freq: Every day | ORAL | Status: DC

## 2022-12-08 MED ORDER — LACTULOSE 10 GM/15ML PO SOLN: 10.0000 g | Freq: Two times a day (BID) | ORAL | Status: DC

## 2022-12-08 MED ORDER — POTASSIUM CHLORIDE CRYS ER 20 MEQ PO TBCR
20.0000 meq | EXTENDED_RELEASE_TABLET | Freq: Once | ORAL | Status: AC
  Administered 2022-12-08: 20 meq via ORAL
  Filled 2022-12-08: qty 1

## 2022-12-08 MED ORDER — POLYETHYLENE GLYCOL 3350 17 G PO PACK: 17.0000 g | PACK | Freq: Every day | ORAL | Status: DC | PRN

## 2022-12-08 MED ORDER — CHLORHEXIDINE GLUCONATE CLOTH 2 % EX PADS
6.0000 | MEDICATED_PAD | Freq: Every day | CUTANEOUS | Status: DC
  Administered 2022-12-08 – 2022-12-09 (×2): 6 via TOPICAL

## 2022-12-08 NOTE — Plan of Care (Signed)

## 2022-12-08 NOTE — Plan of Care (Signed)

## 2022-12-08 NOTE — Progress Notes (Signed)
PROGRESS NOTE    Raymond Hays  ZOX:[id number] DOB: 01/29/1946 DOA: 12/06/2022 PCP: Pearson Grippe, MD   Assessment & Plan:   Principal Problem:   Ileus [facility]) Active Problems:   Paroxysmal atrial fibrillation with RVR (HCC)   Dehydration   Hypothyroidism   Type 2 diabetes mellitus with diabetic neuropathy, without long-term current use of insulin (HCC)   Constipation  Assessment and Plan: Ileus: no BMs today so far. Continue on lactulose, miralax. Repeat XR abd still shows ileus.    PAF: w/ RVR. Holding eliquis. IV lopressor prn. Not on any rate controlling meds as per med rec   Dehydration: resolved   Hypothyroidism: continue on levothyroxine    DM2: likely poorly controlled. Continue on SSI w/ accuchecks  Peripheral neuropathy: continue on gabapentin  HLD: continue on statin  Depression: severity unknown. Holding home dose of citalopram secondary prolong QTc    DVT prophylaxis: lovenox  Code Status: DNR Family Communication: discussed pt's care w/ pt's son, Ayman, and answered his questions  Disposition Plan: likely d/c back to home facility   Level of care: Telemetry Medical Status is: Observation The patient remains OBS appropriate and will d/c before 2 midnights.    Consultants:    Procedures:   Antimicrobials:   Subjective: Pt c/o multiple bowel movements today   Objective: Vitals:   12/07/22 0808 12/07/22 1613 12/07/22 2029 12/08/22 0347  BP: (!) 128/104 120/77 113/83 110/75  Pulse: 99 99 (!) 109 100  Resp: 18  20 20   Temp: 98.5 F (36.9 C) 98 F (36.7 C) 97.9 F (36.6 C) 97.6 F (36.4 C)  TempSrc: Oral Oral Oral Oral  SpO2: 98% 96% 100% 96%  Weight:      Height:        Intake/Output Summary (Last 24 hours) at 12/08/2022 0840 Last data filed at 12/07/2022 2246 Gross per 24 hour  Intake 720 ml  Output 1250 ml  Net -530 ml   Filed Weights   12/07/22 0105  Weight: 97.8 kg    Examination:  General exam: Appears calm and  comfortable  Respiratory system: Clear to auscultation. Respiratory effort normal. Cardiovascular system: S1 & S2+. No rubs, gallops or clicks.  Gastrointestinal system: Abdomen is obese, soft and nontender. Hypoactive bowel sounds heard. Central nervous system: Alert and oriented.  Psychiatry: Judgement and insight appear normal. Mood & affect appropriate.     Data Reviewed: I have personally reviewed following labs and imaging studies  CBC: Recent Labs  Lab 12/06/22 1713 12/08/22 0508  WBC 8.6 6.3  NEUTROABS 5.8  --   HGB 12.2* 12.2*  HCT 38.4* 39.3  MCV 84.0 86.2  PLT 318 258   Basic Metabolic Panel: Recent Labs  Lab 12/06/22 1713 12/07/22 0504 12/08/22 0508  NA 135 137 136  K 4.4 4.1 3.4*  CL 97* 99 98  CO2 26 28 29   GLUCOSE 115* 136* 151*  BUN 38* 38* 33*  CREATININE 1.22 1.05 1.05  CALCIUM 9.1 9.0 8.8*   GFR: Estimated Creatinine Clearance: 65.2 mL/min (by C-G formula based on SCr of 1.05 mg/dL). Liver Function Tests: Recent Labs  Lab 12/06/22 1713  AST 24  ALT 5  ALKPHOS 104  BILITOT 1.3*  PROT 7.5  ALBUMIN 3.6   No results for input(s): "LIPASE", "AMYLASE" in the last 168 hours. No results for input(s): "AMMONIA" in the last 168 hours. Coagulation Profile: No results for input(s): "INR", "PROTIME" in the last 168 hours. Cardiac Enzymes: No results for input(s): "CKTOTAL", "  CKMB", "CKMBINDEX", "TROPONINI" in the last 168 hours. BNP (last 3 results) No results for input(s): "PROBNP" in the last 8760 hours. HbA1C: No results for input(s): "HGBA1C" in the last 72 hours. CBG: No results for input(s): "GLUCAP" in the last 168 hours. Lipid Profile: No results for input(s): "CHOL", "HDL", "LDLCALC", "TRIG", "CHOLHDL", "LDLDIRECT" in the last 72 hours. Thyroid Function Tests: No results for input(s): "TSH", "T4TOTAL", "FREET4", "T3FREE", "THYROIDAB" in the last 72 hours. Anemia Panel: No results for input(s): "VITAMINB12", "FOLATE", "FERRITIN",  "TIBC", "IRON", "RETICCTPCT" in the last 72 hours. Sepsis Labs: No results for input(s): "PROCALCITON", "LATICACIDVEN" in the last 168 hours.  No results found for this or any previous visit (from the past 240 hour(s)).       Radiology Studies: DG Abd Portable 1V  Result Date: 12/07/2022 CLINICAL DATA:  77 year old male with history of ileus. EXAM: PORTABLE ABDOMEN - 1 VIEW COMPARISON:  Abdominal radiograph 12/06/2022. FINDINGS: Diffuse gaseous distension throughout the small bowel and colon again noted, similar to the recent prior examination, presumably reflective of an ileus. No definite pneumoperitoneum. Large volume of stool in the rectal vault on the prior examination appears decreased. 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 MitraClips are noted. IMPRESSION: 1. Diffuse gaseous distension throughout the small bowel and colon suggestive of an ileus. Electronically Signed   By: Trudie Reed M.D.   On: 12/07/2022 13:14   DG Abdomen 1 View  Result Date: 12/06/2022 CLINICAL DATA:  Abdominal distension. EXAM: ABDOMEN - 1 VIEW COMPARISON:  Abdominal radiograph dated 04/08/2021 and CT dated 09/29/2022. FINDINGS: There is large amount of stool throughout the colon and in the rectal vault. There is air distension of redundant sigmoid colon. Mildly dilated air filled small bowel loops measure up to approximately 4 cm in caliber. No free air. Osteopenia with degenerative changes of the spine. Bilateral total hip arthroplasties. No acute osseous pathology. IMPRESSION: Large amount of stool throughout the colon and in the rectal vault. Electronically Signed   By: Elgie Collard M.D.   On: 12/06/2022 19:15        Scheduled Meds:  atorvastatin  20 mg Oral QHS   carbidopa-levodopa  1 tablet Oral TID   enoxaparin (LOVENOX) injection  40 mg Subcutaneous Q24H   gabapentin  400 mg Oral TID   lactulose  10 g Oral Once   levothyroxine  75 mcg Oral Q0600   sorbitol, milk of  mag, mineral oil, glycerin (SMOG) enema  300 mL Rectal Once   Continuous Infusions:     LOS: 1 day       Charise Killian, MD [facility] Pager [phone]  If 7PM-7AM, please contact night-coverage [email or web address] 12/08/2022, 8:40 AM

## 2022-12-08 NOTE — TOC Progression Note (Addendum)
Transition of Care [facility]) - Progression Note    Patient Details  Name: Raymond Hays MRN: [id number] Date of Birth: 1946-02-12  Transition of Care [facility]) CM/SW Contact  Liliana Cline, LCSW Phone Number: 12/08/2022, 9:11 AM  Clinical Narrative:    Attempted to reach out to Gavin Pound at [facility] regarding discharge, awaiting response.  1:08- Attempted to reach out to Gavin Pound at [facility] again.   1:51- Per MD, patient will now be medically ready to go back to [facility] tomorrow. Still awaiting response from [facility] in Admissions.    Expected Discharge Plan: Skilled Nursing Facility Barriers to Discharge: Continued Medical Work up  Expected Discharge Plan and Services       Living arrangements for the past 2 months: Skilled Nursing Facility                                       Social Determinants of Health (SDOH) Interventions SDOH Screenings   Food Insecurity: No Food Insecurity (09/14/2022)  Housing: Low Risk  (09/14/2022)  Transportation Needs: No Transportation Needs (09/14/2022)  Utilities: Not At Risk (09/14/2022)  Depression (PHQ2-9): Low Risk  (09/25/2022)  Social Connections: Unknown (11/30/2021)   Received from [facility]  Tobacco Use: Medium Risk (12/06/2022)    Readmission Risk Interventions    01/12/2021    1:08 PM 10/20/2020    8:59 AM  Readmission Risk Prevention Plan  Transportation Screening Complete Complete  PCP or Specialist Appt within 3-5 Days  Complete  HRI or Home Care Consult  Complete  Palliative Care Screening  Not Applicable  Medication Review (RN Care Manager) Complete Referral to Pharmacy  PCP or Specialist appointment within 3-5 days of discharge Complete   HRI or Home Care Consult Complete   SW Recovery Care/Counseling Consult Complete   Palliative Care Screening Not Applicable   Skilled Nursing Facility Complete

## 2022-12-09 ENCOUNTER — Inpatient Hospital Stay: Payer: No Typology Code available for payment source

## 2022-12-09 DIAGNOSIS — K567 Ileus, unspecified: Principal | ICD-10-CM

## 2022-12-09 LAB — CBC
HCT: 36.1 % — ABNORMAL LOW (ref 39.0–52.0)
Hemoglobin: 11.3 g/dL — ABNORMAL LOW (ref 13.0–17.0)
MCH: 27.1 pg (ref 26.0–34.0)
MCHC: 31.3 g/dL (ref 30.0–36.0)
MCV: 86.6 fL (ref 80.0–100.0)
Platelets: 261 10*3/uL (ref 150–400)
RBC: 4.17 MIL/uL — ABNORMAL LOW (ref 4.22–5.81)
RDW: 17.1 % — ABNORMAL HIGH (ref 11.5–15.5)
WBC: 6.4 10*3/uL (ref 4.0–10.5)
nRBC: 0 % (ref 0.0–0.2)

## 2022-12-09 LAB — BASIC METABOLIC PANEL
Anion gap: 7 (ref 5–15)
BUN: 29 mg/dL — ABNORMAL HIGH (ref 8–23)
CO2: 28 mmol/L (ref 22–32)
Calcium: 8.8 mg/dL — ABNORMAL LOW (ref 8.9–10.3)
Chloride: 103 mmol/L (ref 98–111)
Creatinine, Ser: 0.97 mg/dL (ref 0.61–1.24)
GFR, Estimated: >60 mL/min (ref >60)
Glucose, Bld: 171 mg/dL — ABNORMAL HIGH (ref 70–99)
Potassium: 3.8 mmol/L (ref 3.5–5.1)
Sodium: 138 mmol/L (ref 135–145)

## 2022-12-09 LAB — GLUCOSE, CAPILLARY
Glucose-Capillary: 102 mg/dL — ABNORMAL HIGH (ref 70–99)
Glucose-Capillary: 128 mg/dL — ABNORMAL HIGH (ref 70–99)

## 2022-12-09 NOTE — TOC Transition Note (Signed)
Transition of Care [facility]) - CM/SW Discharge Note   Patient Details  Name: Raymond Hays MRN: [id number] Date of Birth: 25-Nov-1945  Transition of Care [facility]) CM/SW Contact:  Marquita Palms, LCSW Phone Number: 12/09/2022, 12:06 PM   Clinical Narrative:      Pt to DC to [facility] to room 313A. RN to call report to [phone]. [facility] and DNR forms on chart. DC summary sent to facility. EMS has been called. Son notified.    Final next level of care: Skilled Nursing Facility Barriers to Discharge: No Barriers Identified   Patient Goals and CMS Choice      Discharge Placement                    Name of family member notified: Son    Discharge Plan and Services Additional resources added to the After Visit Summary for                                       Social Determinants of Health (SDOH) Interventions SDOH Screenings   Food Insecurity: No Food Insecurity (09/14/2022)  Housing: Low Risk  (09/14/2022)  Transportation Needs: No Transportation Needs (09/14/2022)  Utilities: Not At Risk (09/14/2022)  Depression (PHQ2-9): Low Risk  (09/25/2022)  Social Connections: Unknown (11/30/2021)   Received from [facility]  Tobacco Use: Medium Risk (12/06/2022)     Readmission Risk Interventions    01/12/2021    1:08 PM 10/20/2020    8:59 AM  Readmission Risk Prevention Plan  Transportation Screening Complete Complete  PCP or Specialist Appt within 3-5 Days  Complete  HRI or Home Care Consult  Complete  Palliative Care Screening  Not Applicable  Medication Review (RN Care Manager) Complete Referral to Pharmacy  PCP or Specialist appointment within 3-5 days of discharge Complete   HRI or Home Care Consult Complete   SW Recovery Care/Counseling Consult Complete   Palliative Care Screening Not Applicable   Skilled Nursing Facility Complete

## 2022-12-09 NOTE — Plan of Care (Signed)
  Problem: Education: Goal: Knowledge of General Education information will improve Description: Including pain rating scale, medication(s)/side effects and non-pharmacologic comfort measures Outcome: Progressing   Problem: Health Behavior/Discharge Planning: Goal: Ability to manage health-related needs will improve Outcome: Not Progressing   Problem: Clinical Measurements: Goal: Ability to maintain clinical measurements within normal limits will improve Outcome: Progressing Goal: Will remain free from infection Outcome: Progressing   Problem: Clinical Measurements: Goal: Will remain free from infection Outcome: Progressing

## 2022-12-09 NOTE — Plan of Care (Signed)
Resolve plan of care, patient transferring to SNF.  Raymond Hays

## 2022-12-09 NOTE — Discharge Summary (Signed)
Physician Discharge Summary  Raymond Hays RUE:[id number] DOB: 06-Feb-1946 DOA: 12/06/2022  PCP: Pearson Grippe, MD  Admit date: 12/06/2022 Discharge date: 12/09/2022  [name] Disposition:  [name]   Recommendations for Outpatient Follow-up:  Follow up with PCP in 1 weeks   Home Health: no  Equipment/Devices:  Discharge Condition: stable  CODE STATUS: DNR  Diet recommendation: carb modified   Brief/Interim Summary: HPI was taken from Dr. Arville Care: Raymond Hays is a 77 y.o. Caucasian male with medical history significant for osteoarthritis, paroxysmal atrial fibrillation, COPD, type diabetes mellitus, hypertension, dyslipidemia and spinal stenosis, who presented to the emergency room with onset of abdominal discomfort with associated distention.  The patient has not any big bowel movement per his report for few days and the last time he had a small bowel movement was 2 days ago.  No melena or bright red blood per rectum.  No nausea or vomiting or fever or chills.  No dysuria, oliguria or hematuria or flank pain.  He admits to mild headache.  He has been having mild cough productive of clear sputum without dyspnea or wheezing.  He denies any chest pain or palpitations.   ED Course: When he came to the ER, BP was 141/88 with otherwise normal vital signs and later heart rate was up to 115.  Labs revealed chloride of 97 and BUN of 38 with total bili of 1.3 and otherwise unremarkable CMP.  CBC showed mild anemia EKG as reviewed by me :  EKG showed atrial fibrillation with rapid ventricular sponsor 118 with poor R wave progression. Imaging: Abdomen 1 view x-ray showed large amount of stool throughout the colon in the rectal vault.   The patient was given lactulose, Dulcolax, as well as mag citrate here.  He received 1 Fleet enema at his SNF.  He was still having any bowel movements and persistent discomfort.  He will be admitted to a medical telemetry observation bed for further  evaluation and management  Discharge Diagnoses:  Principal Problem:   Ileus (HCC) Active Problems:   Paroxysmal atrial fibrillation with RVR (HCC)   Dehydration   Hypothyroidism   Type 2 diabetes mellitus with diabetic neuropathy, without long-term current use of insulin (HCC)   Constipation  Ileus: has had at 5 bowel movements while inpatient despite XR findings. No abd pain. Clinically resolved. Continue w/ bowel regimen at d/c    PAF: w/ RVR. Restart eliquis    Dehydration: resolved   Hypothyroidism: continue on levothyroxine    DM2: pretty well controlled, HbA1c 6.8. Carb modified diet    Peripheral neuropathy: continue on gabapentin   HLD: continue on statin   Depression: severity unknown. Restart home dose of citalopram   Discharge Instructions  Discharge Instructions     Diet Carb Modified   Complete by: As directed    Diet general   Complete by: As directed    Discharge instructions   Complete by: As directed    F/u w/ PCP in 1 week   Increase activity slowly   Complete by: As directed       Allergies as of 12/09/2022   No Known Allergies      Medication List     STOP taking these medications    loperamide 2 MG capsule Commonly known as: IMODIUM       TAKE these medications    acetaminophen 325 MG tablet Commonly known as: TYLENOL Take 650 mg by mouth every 4 (four) hours as needed for  mild pain.   acetic acid 0.25 % irrigation Irrigate with 1 Application as directed every 12 (twelve) hours. (Flush foley catheter)   Acidophilus Probiotic Blend Caps Take 1 capsule by mouth daily.   Albuterol Sulfate 108 (90 Base) MCG/ACT Aepb Commonly known as: PROAIR RESPICLICK Inhale 2 puffs into the lungs every 4 (four) hours as needed (COPD).   allopurinol 100 MG tablet Commonly known as: ZYLOPRIM Take 100 mg by mouth daily.   apixaban 5 MG Tabs tablet Commonly known as: ELIQUIS Take 5 mg by mouth 2 (two) times daily.   ascorbic acid 500 MG  tablet Commonly known as: VITAMIN C Take 500 mg by mouth daily.   atorvastatin 20 MG tablet Commonly known as: LIPITOR Take 20 mg by mouth at bedtime.   Biofreeze 4 % Gel Generic drug: Menthol (Topical Analgesic) Apply 1 application  topically 3 (three) times daily. (Apply to neck)   CALCIUM 500 PO Take 2 tablets by mouth every 6 (six) hours as needed (indigestion/heartburn).   carbidopa-levodopa 25-100 MG tablet Commonly known as: SINEMET IR Take 1 tablet by mouth 3 (three) times daily before meals.   cetaphil cream Apply 1 Application topically 2 (two) times daily as needed.   cetirizine 5 MG tablet Commonly known as: ZYRTEC Take 5 mg by mouth at bedtime.   citalopram 10 MG tablet Commonly known as: CELEXA Take 10 mg by mouth at bedtime.   docusate sodium 100 MG capsule Commonly known as: COLACE Take 100 mg by mouth daily.   famotidine 20 MG tablet Commonly known as: PEPCID Take 20 mg by mouth at bedtime.   ferrous sulfate 324 (65 Fe) MG Tbec Take 1 tablet by mouth daily.   fluticasone 50 MCG/ACT nasal spray Commonly known as: FLONASE Place 2 sprays into both nostrils daily.   Fluticasone-Umeclidin-Vilant 100-62.5-25 MCG/ACT Aepb Inhale 1 puff into the lungs daily.   furosemide 20 MG tablet Commonly known as: LASIX Take 20 mg by mouth daily as needed for edema.   gabapentin 800 MG tablet Commonly known as: NEURONTIN Take 800 mg by mouth 4 (four) times daily.   guaifenesin 100 MG/5ML syrup Commonly known as: ROBITUSSIN Take 200 mg by mouth every 4 (four) hours as needed for cough.   hydrocortisone 25 MG suppository Commonly known as: ANUSOL-HC Place 25 mg rectally every 4 (four) hours as needed for hemorrhoids or anal itching.   hydrOXYzine 25 MG tablet Commonly known as: ATARAX Take 25 mg by mouth every 6 (six) hours as needed for itching.   ipratropium 0.06 % nasal spray Commonly known as: ATROVENT Place 2 sprays into both nostrils 3 (three)  times daily.   lactase 3000 units tablet Commonly known as: LACTAID Take 3,000 Units by mouth 3 (three) times daily with meals.   levothyroxine 75 MCG tablet Commonly known as: SYNTHROID Take 75 mcg by mouth daily before breakfast.   lidocaine 4 % Place 1 patch onto the skin daily as needed (pain). (Remove after 12 hours)   melatonin 5 MG Tabs Take 10 mg by mouth at bedtime.   Metamucil Smooth Texture 58.6 % powder Generic drug: psyllium Take 1 packet by mouth 3 (three) times daily.   methocarbamol 500 MG tablet Commonly known as: ROBAXIN Take 500 mg by mouth daily. At 1400 What changed: Another medication with the same name was removed. Continue taking this medication, and follow the directions you see here.   nortriptyline 10 MG capsule Commonly known as: PAMELOR Take 20 mg by mouth  at bedtime.   OcuSoft Lid Scrub Original Pads Place 1 Pad into both eyes every 12 (twelve) hours as needed (eyelid cleansing). What changed: Another medication with the same name was removed. Continue taking this medication, and follow the directions you see here.   oxybutynin 5 MG tablet Commonly known as: DITROPAN Take 5 mg by mouth 2 (two) times daily.   polyethylene glycol 17 g packet Commonly known as: MIRALAX / GLYCOLAX Take 17 g by mouth daily.   pramoxine 1 % Lotn Commonly known as: SARNA SENSITIVE Apply 1 Application topically 4 (four) times daily as needed.   Propylene Glycol 0.6 % Soln Place 2 drops into both eyes in the morning, at noon, and at bedtime.   selenium sulfide 1 % Lotn Commonly known as: SELSUN Apply 1 Application topically 2 (two) times a week. (Tuesday and Friday)   senna 8.6 MG Tabs tablet Commonly known as: SENOKOT Take 1 tablet (8.6 mg total) by mouth daily as needed for mild constipation. What changed: when to take this   silver sulfADIAZINE 1 % cream Commonly known as: SILVADENE Apply 1 Application topically daily. (Apply to arms and face)    sodium chloride 0.65 % Soln nasal spray Commonly known as: OCEAN Place 2 sprays into both nostrils every 6 (six) hours as needed for congestion.   tacrolimus 0.03 % ointment Commonly known as: PROTOPIC Apply 1 application  topically daily. (Apply to eyelids)   terbinafine 1 % cream Commonly known as: LAMISIL Apply 1 Application topically at bedtime. (Apply to feet)   tiZANidine 2 MG tablet Commonly known as: ZANAFLEX Take 2 mg by mouth 2 (two) times daily as needed for muscle spasms.   triamcinolone cream 0.1 % Commonly known as: KENALOG Apply 1 Application topically daily as needed. Apply to arms, legs, and back r/t rash   Vitamin D-3 25 MCG (1000 UT) Caps Take 1,000 Units by mouth daily.        No Known Allergies  Consultations:   Procedures/Studies: DG Abd Portable 1V  Result Date: 12/09/2022 CLINICAL DATA:  Ileus.  Abdominal pain. EXAM: PORTABLE ABDOMEN - 1 VIEW COMPARISON:  Abdominal radiographs 12/08/2022 FINDINGS: There is persistent gaseous distension of multiple loops of small bowel and of:, overall not significantly changed from the prior study. Prominent stool is noted in the right colon. Bilateral hip arthroplasties are noted. IMPRESSION: Unchanged bowel distension suggestive of ileus. Electronically Signed   By: Sebastian Ache M.D.   On: 12/09/2022 10:11   DG Abd Portable 1V  Result Date: 12/08/2022 CLINICAL DATA:  Ileus. EXAM: PORTABLE ABDOMEN - 1 VIEW COMPARISON:  December 07, 2022. FINDINGS: Stable air-filled and dilated bowel loops are noted most consistent with ileus. No abnormal calcifications. IMPRESSION: Stable large and small bowel dilatation concerning for ileus. Electronically Signed   By: Lupita Raider M.D.   On: 12/08/2022 10:43   DG Abd Portable 1V  Result Date: 12/07/2022 CLINICAL DATA:  77 year old male with history of ileus. EXAM: PORTABLE ABDOMEN - 1 VIEW COMPARISON:  Abdominal radiograph 12/06/2022. FINDINGS: Diffuse gaseous distension  throughout the small bowel and colon again noted, similar to the recent prior examination, presumably reflective of an ileus. No definite pneumoperitoneum. Large volume of stool in the rectal vault on the prior examination appears decreased. 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 MitraClips are noted. IMPRESSION: 1. Diffuse gaseous distension throughout the small bowel and colon suggestive of an ileus. Electronically Signed   By: Brayton Mars.D.  On: 12/07/2022 13:14   DG Abdomen 1 View  Result Date: 12/06/2022 CLINICAL DATA:  Abdominal distension. EXAM: ABDOMEN - 1 VIEW COMPARISON:  Abdominal radiograph dated 04/08/2021 and CT dated 09/29/2022. FINDINGS: There is large amount of stool throughout the colon and in the rectal vault. There is air distension of redundant sigmoid colon. Mildly dilated air filled small bowel loops measure up to approximately 4 cm in caliber. No free air. Osteopenia with degenerative changes of the spine. Bilateral total hip arthroplasties. No acute osseous pathology. IMPRESSION: Large amount of stool throughout the colon and in the rectal vault. Electronically Signed   By: Elgie Collard M.D.   On: 12/06/2022 19:15   (Echo, Carotid, EGD, Colonoscopy, ERCP)    Subjective: Pt denies any complaints    Discharge Exam: Vitals:   12/09/22 0539 12/09/22 0800  BP: 131/77 (!) 140/80  Pulse: 100 92  Resp: 20 17  Temp: 98 F (36.7 C) 98.1 F (36.7 C)  SpO2: 100% 100%   Vitals:   12/08/22 1632 12/08/22 2014 12/09/22 0539 12/09/22 0800  BP: (!) 137/118 133/72 131/77 (!) 140/80  Pulse:  (!) 102 100 92  Resp: 16 20 20 17   Temp: 98.4 F (36.9 C) 98.1 F (36.7 C) 98 F (36.7 C) 98.1 F (36.7 C)  TempSrc: Oral   Oral  SpO2: 100% 99% 100% 100%  Weight:      Height:        General: Pt is alert, awake, not in acute distress Cardiovascular: S1/S2 +, no rubs, no gallops Respiratory: CTA bilaterally, no wheezing, no rhonchi Abdominal: Soft,  NT, obese, bowel sounds + Extremities:  no cyanosis    The results of significant diagnostics from this hospitalization (including imaging, microbiology, ancillary and laboratory) are listed below for reference.     Microbiology: No results found for this or any previous visit (from the past 240 hour(s)).   Labs: BNP (last 3 results) Recent Labs    09/15/22 0412  BNP 303.8*   Basic Metabolic Panel: Recent Labs  Lab 12/06/22 1713 12/07/22 0504 12/08/22 0508 12/09/22 0454  NA 135 137 136 138  K 4.4 4.1 3.4* 3.8  CL 97* 99 98 103  CO2 26 28 29 28   GLUCOSE 115* 136* 151* 171*  BUN 38* 38* 33* 29*  CREATININE 1.22 1.05 1.05 0.97  CALCIUM 9.1 9.0 8.8* 8.8*   Liver Function Tests: Recent Labs  Lab 12/06/22 1713  AST 24  ALT 5  ALKPHOS 104  BILITOT 1.3*  PROT 7.5  ALBUMIN 3.6   No results for input(s): "LIPASE", "AMYLASE" in the last 168 hours. No results for input(s): "AMMONIA" in the last 168 hours. CBC: Recent Labs  Lab 12/06/22 1713 12/08/22 0508 12/09/22 0454  WBC 8.6 6.3 6.4  NEUTROABS 5.8  --   --   HGB 12.2* 12.2* 11.3*  HCT 38.4* 39.3 36.1*  MCV 84.0 86.2 86.6  PLT 318 258 261   Cardiac Enzymes: No results for input(s): "CKTOTAL", "CKMB", "CKMBINDEX", "TROPONINI" in the last 168 hours. BNP: Invalid input(s): "POCBNP" CBG: Recent Labs  Lab 12/08/22 1220 12/08/22 1619 12/08/22 2122 12/09/22 0818  GLUCAP 142* 103* 125* 102*   D-Dimer No results for input(s): "DDIMER" in the last 72 hours. Hgb A1c No results for input(s): "HGBA1C" in the last 72 hours. Lipid Profile No results for input(s): "CHOL", "HDL", "LDLCALC", "TRIG", "CHOLHDL", "LDLDIRECT" in the last 72 hours. Thyroid function studies No results for input(s): "TSH", "T4TOTAL", "T3FREE", "THYROIDAB" in the last  72 hours.  Invalid input(s): "FREET3" Anemia work up No results for input(s): "VITAMINB12", "FOLATE", "FERRITIN", "TIBC", "IRON", "RETICCTPCT" in the last 72  hours. Urinalysis    Component Value Date/Time   COLORURINE YELLOW (A) 09/14/2022 1228   APPEARANCEUR CLOUDY (A) 09/14/2022 1228   LABSPEC 1.017 09/14/2022 1228   PHURINE 8.0 09/14/2022 1228   GLUCOSEU NEGATIVE 09/14/2022 1228   HGBUR SMALL (A) 09/14/2022 1228   BILIRUBINUR NEGATIVE 09/14/2022 1228   KETONESUR NEGATIVE 09/14/2022 1228   PROTEINUR 100 (A) 09/14/2022 1228   NITRITE NEGATIVE 09/14/2022 1228   LEUKOCYTESUR LARGE (A) 09/14/2022 1228   Sepsis Labs Recent Labs  Lab 12/06/22 1713 12/08/22 0508 12/09/22 0454  WBC 8.6 6.3 6.4   Microbiology No results found for this or any previous visit (from the past 240 hour(s)).   Time coordinating discharge: Over 30 minutes  SIGNED:   Charise Killian, MD  [facility] 12/09/2022, 11:39 AM Pager   If 7PM-7AM, please contact night-coverage [email or web address]

## 2022-12-09 NOTE — Progress Notes (Signed)
[facility]lyda Greener to be D/C'd Skilled nursing facility per MD order.  Discussed prescriptions and follow up appointments with the patient. Prescriptions given to patient, medication list explained in detail. Pt verbalized understanding. Report called to [facility]. Spoke with [employer]. She verbalized understanding of patient's care.  Allergies as of 12/09/2022   No Known Allergies      Medication List     STOP taking these medications    loperamide 2 MG capsule Commonly known as: IMODIUM       TAKE these medications    acetaminophen 325 MG tablet Commonly known as: TYLENOL Take 650 mg by mouth every 4 (four) hours as needed for mild pain.   acetic acid 0.25 % irrigation Irrigate with 1 Application as directed every 12 (twelve) hours. (Flush foley catheter)   Acidophilus Probiotic Blend Caps Take 1 capsule by mouth daily.   Albuterol Sulfate 108 (90 Base) MCG/ACT Aepb Commonly known as: PROAIR RESPICLICK Inhale 2 puffs into the lungs every 4 (four) hours as needed (COPD).   allopurinol 100 MG tablet Commonly known as: ZYLOPRIM Take 100 mg by mouth daily.   apixaban 5 MG Tabs tablet Commonly known as: ELIQUIS Take 5 mg by mouth 2 (two) times daily.   ascorbic acid 500 MG tablet Commonly known as: VITAMIN C Take 500 mg by mouth daily.   atorvastatin 20 MG tablet Commonly known as: LIPITOR Take 20 mg by mouth at bedtime.   Biofreeze 4 % Gel Generic drug: Menthol (Topical Analgesic) Apply 1 application  topically 3 (three) times daily. (Apply to neck)   CALCIUM 500 PO Take 2 tablets by mouth every 6 (six) hours as needed (indigestion/heartburn).   carbidopa-levodopa 25-100 MG tablet Commonly known as: SINEMET IR Take 1 tablet by mouth 3 (three) times daily before meals.   cetaphil cream Apply 1 Application topically 2 (two) times daily as needed.   cetirizine 5 MG tablet Commonly known as: ZYRTEC Take 5 mg by mouth at bedtime.   citalopram 10 MG tablet Commonly  known as: CELEXA Take 10 mg by mouth at bedtime.   docusate sodium 100 MG capsule Commonly known as: COLACE Take 100 mg by mouth daily.   famotidine 20 MG tablet Commonly known as: PEPCID Take 20 mg by mouth at bedtime.   ferrous sulfate 324 (65 Fe) MG Tbec Take 1 tablet by mouth daily.   fluticasone 50 MCG/ACT nasal spray Commonly known as: FLONASE Place 2 sprays into both nostrils daily.   Fluticasone-Umeclidin-Vilant 100-62.5-25 MCG/ACT Aepb Inhale 1 puff into the lungs daily.   furosemide 20 MG tablet Commonly known as: LASIX Take 20 mg by mouth daily as needed for edema.   gabapentin 800 MG tablet Commonly known as: NEURONTIN Take 800 mg by mouth 4 (four) times daily.   guaifenesin 100 MG/5ML syrup Commonly known as: ROBITUSSIN Take 200 mg by mouth every 4 (four) hours as needed for cough.   hydrocortisone 25 MG suppository Commonly known as: ANUSOL-HC Place 25 mg rectally every 4 (four) hours as needed for hemorrhoids or anal itching.   hydrOXYzine 25 MG tablet Commonly known as: ATARAX Take 25 mg by mouth every 6 (six) hours as needed for itching.   ipratropium 0.06 % nasal spray Commonly known as: ATROVENT Place 2 sprays into both nostrils 3 (three) times daily.   lactase 3000 units tablet Commonly known as: LACTAID Take 3,000 Units by mouth 3 (three) times daily with meals.   levothyroxine 75 MCG tablet Commonly known as:  SYNTHROID Take 75 mcg by mouth daily before breakfast.   lidocaine 4 % Place 1 patch onto the skin daily as needed (pain). (Remove after 12 hours)   melatonin 5 MG Tabs Take 10 mg by mouth at bedtime.   Metamucil Smooth Texture 58.6 % powder Generic drug: psyllium Take 1 packet by mouth 3 (three) times daily.   methocarbamol 500 MG tablet Commonly known as: ROBAXIN Take 500 mg by mouth daily. At 1400 What changed: Another medication with the same name was removed. Continue taking this medication, and follow the directions  you see here.   nortriptyline 10 MG capsule Commonly known as: PAMELOR Take 20 mg by mouth at bedtime.   OcuSoft Lid Scrub Original Pads Place 1 Pad into both eyes every 12 (twelve) hours as needed (eyelid cleansing). What changed: Another medication with the same name was removed. Continue taking this medication, and follow the directions you see here.   oxybutynin 5 MG tablet Commonly known as: DITROPAN Take 5 mg by mouth 2 (two) times daily.   polyethylene glycol 17 g packet Commonly known as: MIRALAX / GLYCOLAX Take 17 g by mouth daily.   pramoxine 1 % Lotn Commonly known as: SARNA SENSITIVE Apply 1 Application topically 4 (four) times daily as needed.   Propylene Glycol 0.6 % Soln Place 2 drops into both eyes in the morning, at noon, and at bedtime.   selenium sulfide 1 % Lotn Commonly known as: SELSUN Apply 1 Application topically 2 (two) times a week. (Tuesday and Friday)   senna 8.6 MG Tabs tablet Commonly known as: SENOKOT Take 1 tablet (8.6 mg total) by mouth daily as needed for mild constipation. What changed: when to take this   silver sulfADIAZINE 1 % cream Commonly known as: SILVADENE Apply 1 Application topically daily. (Apply to arms and face)   sodium chloride 0.65 % Soln nasal spray Commonly known as: OCEAN Place 2 sprays into both nostrils every 6 (six) hours as needed for congestion.   tacrolimus 0.03 % ointment Commonly known as: PROTOPIC Apply 1 application  topically daily. (Apply to eyelids)   terbinafine 1 % cream Commonly known as: LAMISIL Apply 1 Application topically at bedtime. (Apply to feet)   tiZANidine 2 MG tablet Commonly known as: ZANAFLEX Take 2 mg by mouth 2 (two) times daily as needed for muscle spasms.   triamcinolone cream 0.1 % Commonly known as: KENALOG Apply 1 Application topically daily as needed. Apply to arms, legs, and back r/t rash   Vitamin D-3 25 MCG (1000 UT) Caps Take 1,000 Units by mouth daily.         Vitals:   12/09/22 0800 12/09/22 1100  BP: (!) 140/80 116/75  Pulse: 92 87  Resp: 17 17  Temp: 98.1 F (36.7 C) 98 F (36.7 C)  SpO2: 100% 99%    Skin clean, dry and intact without evidence of skin break down, no evidence of skin tears noted. IV catheter discontinued intact. Site without signs and symptoms of complications. Dressing and pressure applied. Pt denies pain at this time. No complaints noted.  An After Visit Summary was printed and placed in packet for facility.  EMS was called to transport patient to [facility].   Ezequiel Macauley C. Jilda Roche

## 2022-12-10 ENCOUNTER — Telehealth: Payer: Self-pay | Admitting: Physician Assistant

## 2022-12-10 NOTE — Telephone Encounter (Signed)
[facility] called to check the status. I was not available I left a voicemail.

## 2022-12-16 NOTE — Telephone Encounter (Signed)
I called Mr. Ernestina Patches back to let him know the patient doesn't have a procedure schedule at this time but he do have an office visit schedule.

## 2022-12-20 IMAGING — US US EXTREM LOW VENOUS
1 series · 14 of 24 positions shown · non-contrast
Comparison: None.

CLINICAL DATA: Lower extremity edema

EXAM:
BILATERAL LOWER EXTREMITY VENOUS DOPPLER ULTRASOUND
TECHNIQUE: Gray-scale sonography with compression, as well as color and duplex
ultrasound, were performed to evaluate the deep venous system(s)
from the level of the common femoral vein through the popliteal and
proximal calf veins.

[Series 1: us venous img lower bilat (dvt) · portal-venous · 14 of 61 slices shown]
[im 1/61]
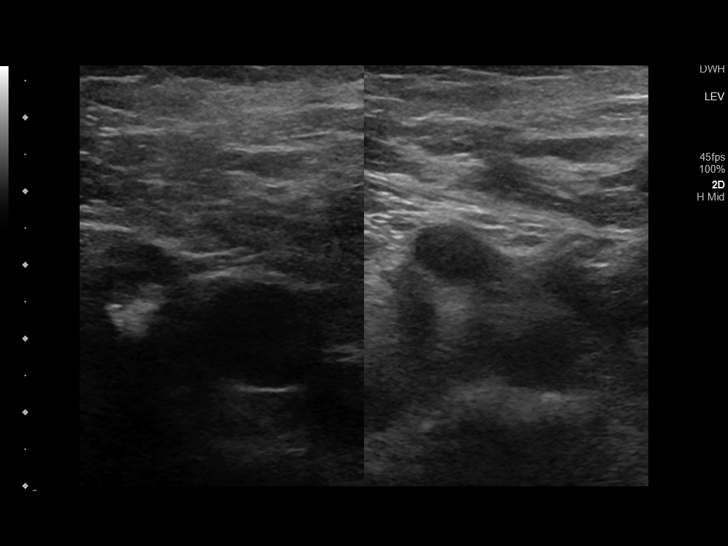
[im 6/61]
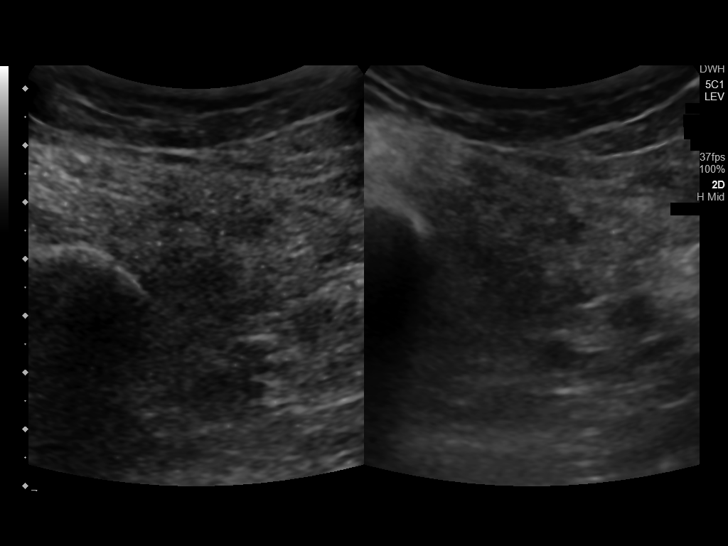
[im 11/61]
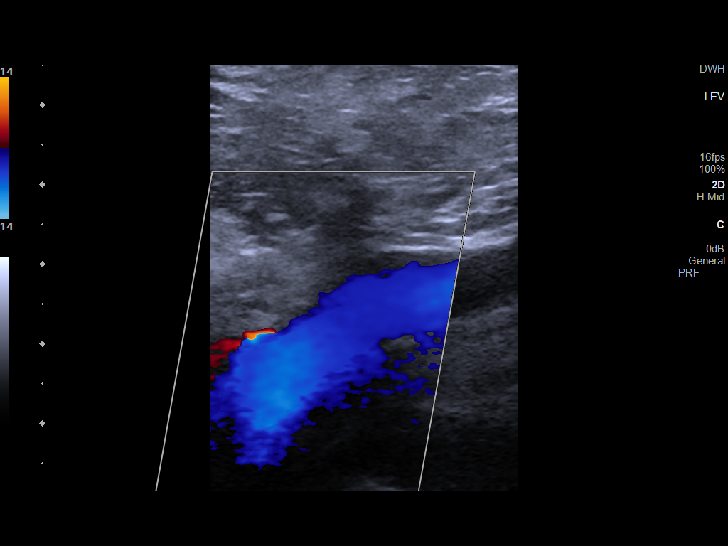
[im 16/61]
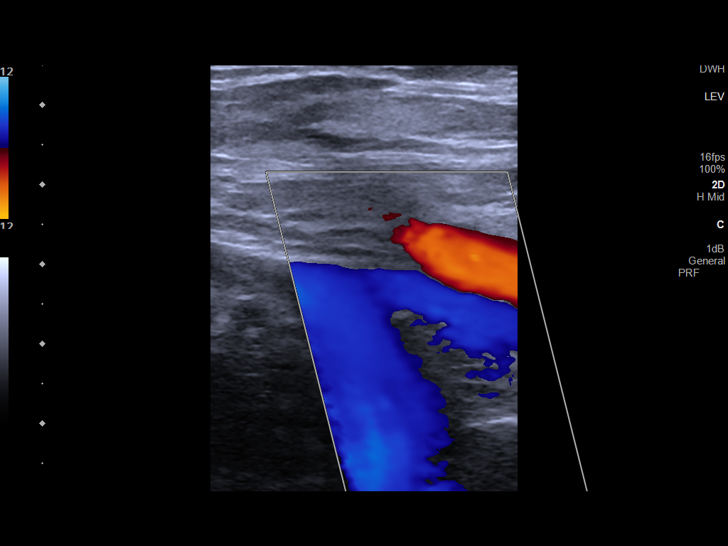
[im 19/61]
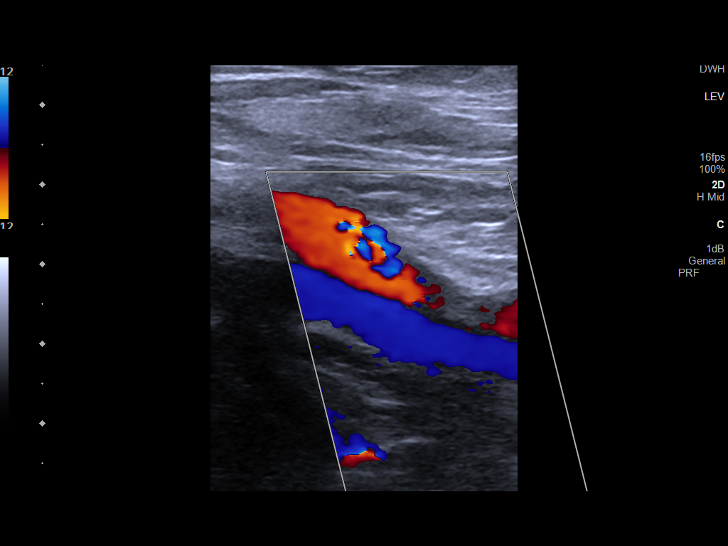
[im 24/61]
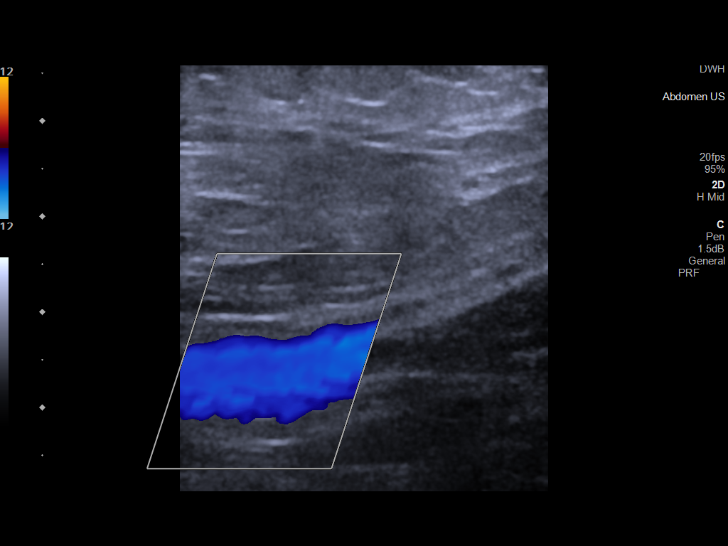
[im 29/61]
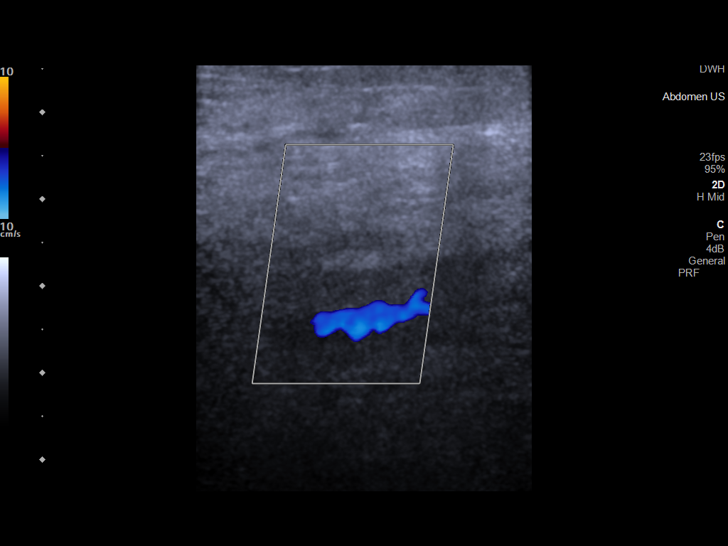
[im 32/61]
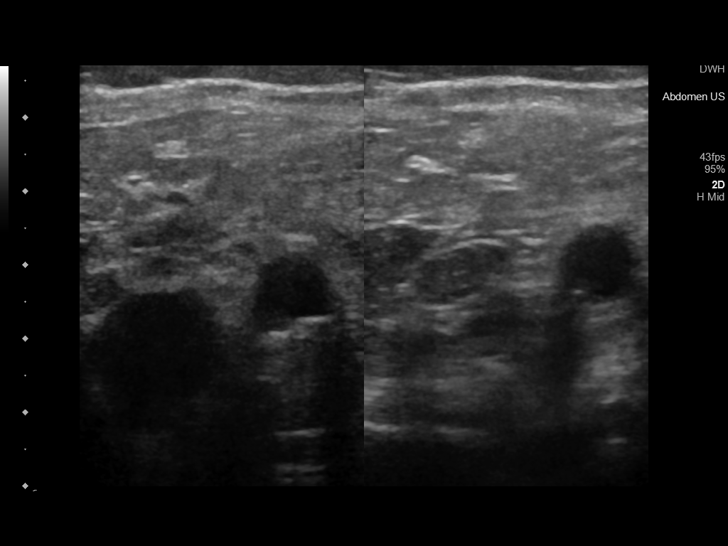
[im 37/61]
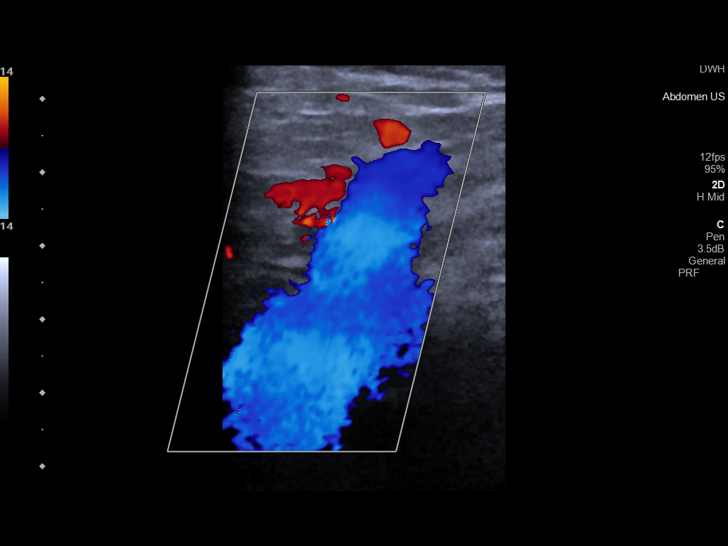
[im 42/61]
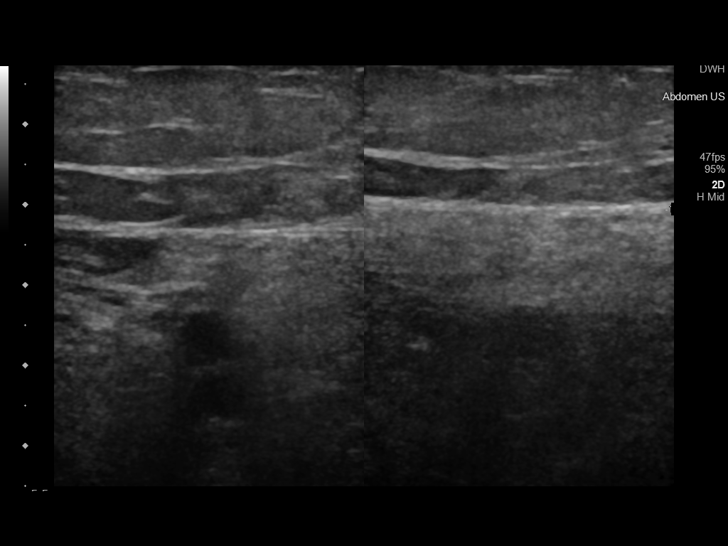
[im 47/61]
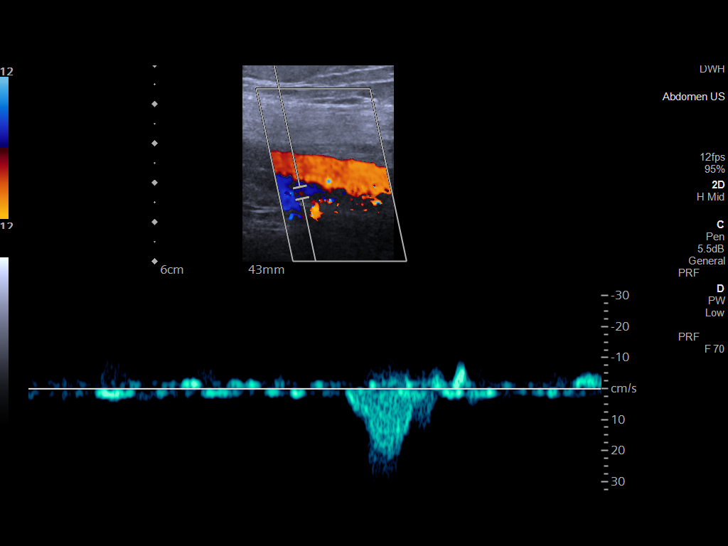
[im 50/61]
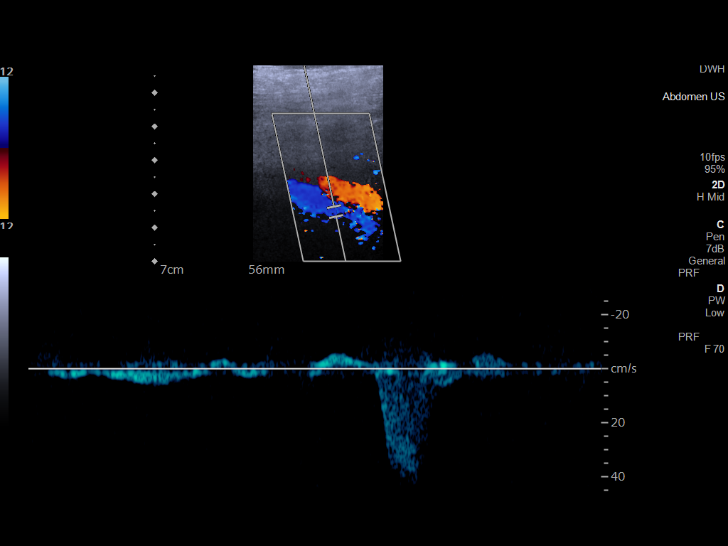
[im 55/61]
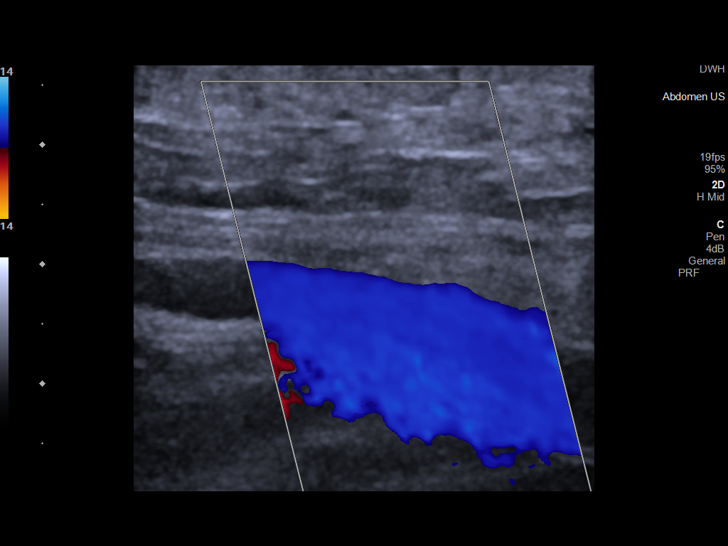
[im 61/61]
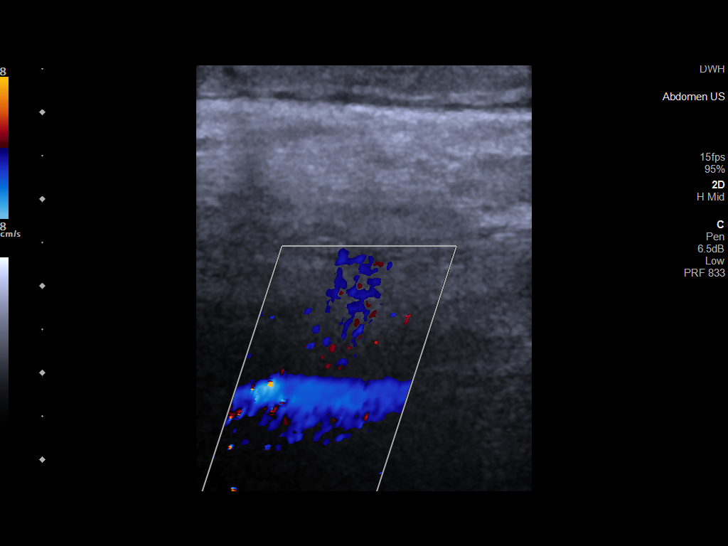

[14 of 24 positions shown; findings below may reference images not displayed]

FINDINGS: VENOUS

Normal compressibility of the common femoral, superficial femoral,
and popliteal veins, as well as the visualized calf veins.
Visualized portions of profunda femoral vein and great saphenous
vein unremarkable. No filling defects to suggest DVT on grayscale or
color Doppler imaging. Doppler waveforms show normal direction of
venous flow, normal respiratory plasticity and response to
augmentation.

Limited views of the contralateral common femoral vein are
unremarkable.

OTHER

None.

Limitations: none
IMPRESSION: Negative.

## 2022-12-22 IMAGING — DX DG CHEST 1V PORT
1 series · 1 of 1 positions shown · non-contrast
Comparison: 01/09/2021

CLINICAL DATA: Cough.  COPD.  Diabetes.

EXAM:
PORTABLE CHEST 1 VIEW

[chest ap]
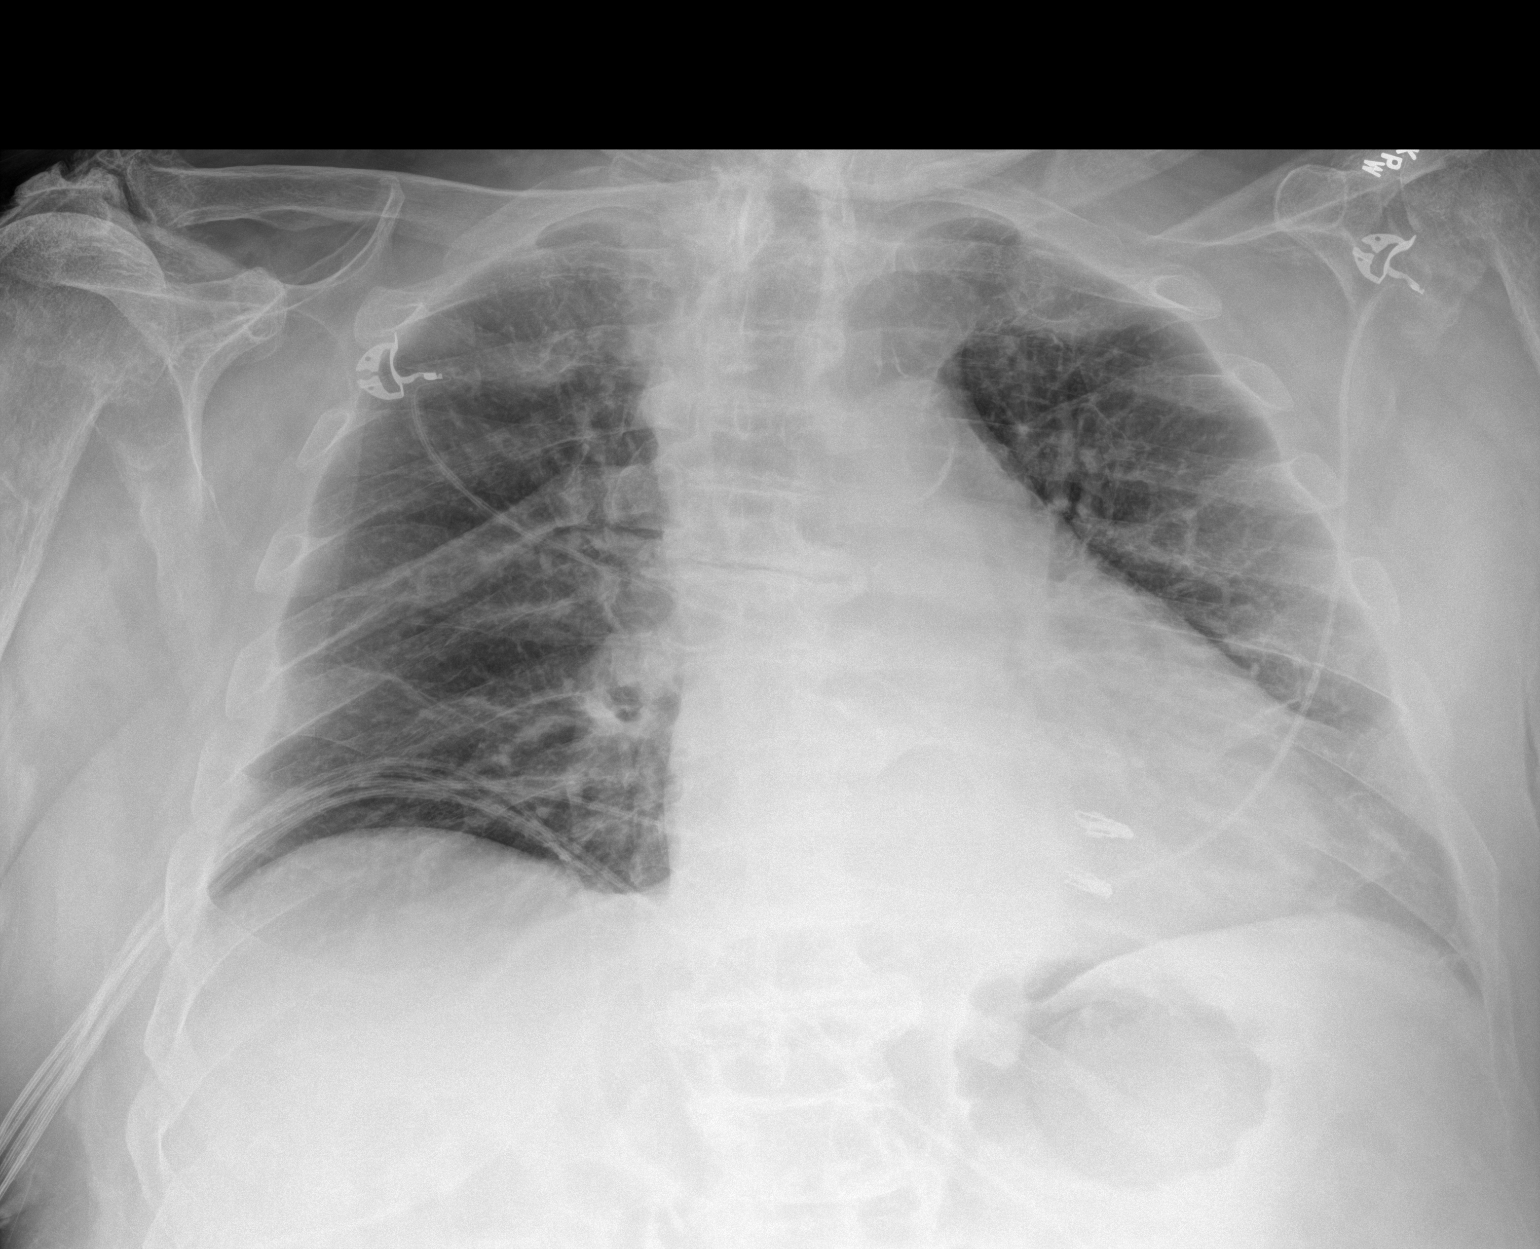

[1 of 1 positions shown; findings below may reference images not displayed]

FINDINGS: Cervical spine fixation. Apical lordotic positioning. Patient
rotated left. Cardiomegaly accentuated by AP portable technique.
Atherosclerosis in the transverse aorta. No pleural effusion or
pneumothorax. Low lung volumes with resultant pulmonary interstitial
prominence. No overt congestive failure. Numerous leads and wires
project over the chest. No lobar consolidation.
IMPRESSION: Cardiomegaly and low lung volumes.  No acute findings.

Aortic Atherosclerosis (NYBV8-NX0.0).

## 2022-12-31 ENCOUNTER — Telehealth: Payer: Self-pay

## 2022-12-31 NOTE — Telephone Encounter (Signed)
Kent from [place] would like to know when or if pt  will be getting procedure? I didn't see one scheduled. Please call Kent at [phone]

## 2023-01-01 ENCOUNTER — Telehealth: Payer: Self-pay

## 2023-01-01 NOTE — Telephone Encounter (Signed)
Spoke with [place] @ [facility] faxed last office note [phone].

## 2023-02-17 ENCOUNTER — Ambulatory Visit: Payer: No Typology Code available for payment source | Admitting: Occupational Therapy

## 2023-02-20 ENCOUNTER — Ambulatory Visit: Payer: No Typology Code available for payment source | Admitting: Occupational Therapy

## 2023-02-20 ENCOUNTER — Other Ambulatory Visit: Payer: Self-pay

## 2023-02-20 DIAGNOSIS — M6281 Muscle weakness (generalized): Secondary | ICD-10-CM | POA: Diagnosis present

## 2023-02-20 DIAGNOSIS — R29898 Other symptoms and signs involving the musculoskeletal system: Secondary | ICD-10-CM | POA: Diagnosis present

## 2023-02-20 DIAGNOSIS — R278 Other lack of coordination: Secondary | ICD-10-CM | POA: Diagnosis not present

## 2023-02-20 DIAGNOSIS — M25632 Stiffness of left wrist, not elsewhere classified: Secondary | ICD-10-CM

## 2023-02-20 DIAGNOSIS — M25642 Stiffness of left hand, not elsewhere classified: Secondary | ICD-10-CM

## 2023-02-20 NOTE — Patient Instructions (Signed)
Pt requested OT to fill out the following form:    Clarification: OT Evaluation + 1x per week for 4 weeks

## 2023-02-20 NOTE — Therapy (Signed)
OUTPATIENT OCCUPATIONAL THERAPY ORTHO EVALUATION  Patient Name: Raymond Hays MRN: [id number] DOB:04-23-1945, 77 y.o., male Today's Date: 02/20/2023  PCP: Pearson Grippe, MD  REFERRING PROVIDER: Theodis Sato, [facility])  END OF SESSION:  OT End of Session - 02/20/23 1702     Visit Number 1    Number of Visits 5   including eval   Date for OT Re-Evaluation 04/04/23    Authorization Type Payer: [employer], Plan: [facility]    Authorization Time Period Vaild Through 01-28-23 to 05-30-23,15 visits    OT Start Time 1450    OT Stop Time 1530    OT Time Calculation (min) 40 min    Activity Tolerance Treatment limited secondary to medical complications (Comment)   complex medical history   Behavior During Therapy [facility] for tasks assessed/performed             Past Medical History:  Diagnosis Date   Allergic rhinitis    Anxiety    Arthritis    Atrial fibrillation (HCC)    Chronic indwelling Foley catheter    COPD (chronic obstructive pulmonary disease) (HCC)    Cough    Diabetes (HCC)    Essential (primary) hypertension    Hemorrhoid    Hyperlipidemia    PAF (paroxysmal atrial fibrillation) (HCC)    Primary osteoarthritis, unspecified shoulder    Retention of urine, unspecified    Spinal stenosis    UTI (urinary tract infection)    Past Surgical History:  Procedure Laterality Date   APPENDECTOMY     BACK SURGERY     COLONOSCOPY N/A 10/27/2020   Procedure: COLONOSCOPY;  Surgeon: Wyline Mood, MD;  Location: [facility] ENDOSCOPY;  Service: Gastroenterology;  Laterality: N/A;   ELBOW SURGERY     ESOPHAGOGASTRODUODENOSCOPY (EGD) WITH PROPOFOL N/A 10/27/2020   Procedure: ESOPHAGOGASTRODUODENOSCOPY (EGD) WITH PROPOFOL;  Surgeon: Wyline Mood, MD;  Location: [facility] ENDOSCOPY;  Service: Gastroenterology;  Laterality: N/A;   HERNIA REPAIR     KNEE SURGERY     neck     PARTIAL HIP ARTHROPLASTY Bilateral    TEE WITHOUT CARDIOVERSION N/A  10/19/2020   Procedure: TRANSESOPHAGEAL ECHOCARDIOGRAM (TEE);  Surgeon: Antonieta Iba, MD;  Location: [facility] ORS;  Service: Cardiovascular;  Laterality: N/A;   TONSILLECTOMY     Patient Active Problem List   Diagnosis Date Noted   Dehydration 12/07/2022   Paroxysmal atrial fibrillation with RVR (HCC) 12/07/2022   Constipation 12/07/2022   Thoracic spondylosis with cord compression (T10) 09/25/2022   Hx of thoracic laminectomy (T11-12) 09/25/2022   Metabolic acidosis 09/15/2022   Aspiration pneumonia of right lower lobe (HCC) 09/15/2022   Complicated UTI (urinary tract infection) 09/14/2022   Hypothyroidism 09/14/2022   Depression with anxiety 09/14/2022   Obesity (BMI 30-39.9) 09/14/2022   Prolonged QT interval 09/14/2022   Chronic indwelling Foley catheter 09/14/2022   Abnormal MRI, thoracic spine (08/23/2022) 09/03/2022   DDD (degenerative disc disease), cervical 08/05/2022   Neurogenic incontinence 08/05/2022   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) 08/05/2022   Foraminal stenosis of thoracic region 08/05/2022   Thoracic Facet Arthropathy 08/05/2022   Lower extremity numbness (Bilateral) 08/05/2022   Weakness of lower extremity (Bilateral) 08/05/2022   Chronic midline low back pain with left-sided sciatica 08/05/2022   Left foot drop 08/05/2022   Lumbar postlaminectomy syndrome 08/05/2022   Poor memory 08/05/2022   Vitamin B12 deficiency 07/25/2022   Pharmacologic therapy 07/24/2022   Disorder of skeletal system 07/24/2022   Problems influencing health  status 07/24/2022   Elevated sed rate 07/24/2022   Elevated C-reactive protein (CRP) 07/24/2022   Chronic anticoagulation (ELIQUIS) 07/24/2022   Chronic neck and back pain (1ry area of Pain) (Bilateral) (L>R) 07/24/2022   Abnormal MRI, cervical spine (08/23/2022) 07/24/2022   History of cervical spinal surgery 07/24/2022   History of lumbar surgery 07/24/2022   History of thoracic surgery 07/24/2022    Laryngopharyngeal reflux 06/17/2022   Throat clearing 06/17/2022   Acute respiratory failure (HCC) 04/03/2021   E. 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/27/2020   Chronic respiratory failure with hypoxia (HCC) 01/10/2020   Impaired functional mobility, balance, gait, and endurance 09/15/2019   Catheter cystitis (HCC) 10/16/2018   Urinary retention 07/14/2018   COVID-19 virus detected 07/03/2018   Benign prostatic hyperplasia with lower urinary tract symptoms 08/06/2017   Osteoarthritis of right knee 06/19/2017   S/P orthopedic surgery, follow-up exam 10/25/2016   Tendon rupture of wrist, sequela 01/02/2016   Rotator cuff tear arthropathy of right shoulder 09/28/2015   Acute pain of right knee 08/03/2015   Chronic knee pain (Right) 08/03/2015   Calcific tendinitis of left shoulder 07/05/2015   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/05/2015   Primary osteoarthritis of left shoulder 07/05/2015   Gait difficulty 06/26/2015   Severe mitral regurgitation 06/05/2015   AF (paroxysmal atrial fibrillation) (HCC) 05/31/2015   Chronic pain syndrome 05/31/2015   COPD with acute exacerbation (HCC) 05/31/2015   Essential hypertension 05/31/2015   Atrial fibrillation (HCC) 05/31/2015   COPD exacerbation (HCC) 05/31/2015   Chronic wrist pain (Right) 04/24/2015   Arthritis of left wrist 04/24/2015   Rupture of extensor tendon of left hand 04/24/2015    ONSET DATE: 02/04/23 (referral date)  REFERRING DIAG: Unspecified acquired deformity of hand,  left hand (ICD-10-CM M21.942)  THERAPY DIAG:  Stiffness of left hand, not elsewhere classified  Other symptoms and signs involving the musculoskeletal system  Muscle weakness (generalized)  Other lack of coordination  Stiffness of left wrist, not elsewhere classified  Rationale for Evaluation and Treatment: Rehabilitation  SUBJECTIVE:   SUBJECTIVE STATEMENT: Pt reported "3 different times, 3 different fingers snapped. Either a ligament or something else came apart, rendering my hand useless. I can only use these 2 fingers [pointer finger and thumb] while the rest of the fingers don't work no more." Pt reports digits 3-5 "got bad a few years ago."  Pt accompanied by: self  PERTINENT HISTORY: Per pt report: blind in L eye, PD dx, "left hand don't work," and "legs don't work."  Per 01/23/23 Theodis Sato Note:   PRECAUTIONS: Fall and Other: urinary catheter , blind in L eye  RED FLAGS: Complex medical hx, blind in L eye, see above for details  WEIGHT BEARING RESTRICTIONS: No - Pt reported none  PAIN:  Are you having pain? No  FALLS: Has patient fallen in last 6 months? No  LIVING ENVIRONMENT: Assisted living facility/long-term care facility, receives assistance for ADLs, uses  manual w/c   PLOF: Needs assistance with ADLs. Pt reports "completely reliant on people that take care of me."  PATIENT GOALS: "to live as long as I can"  NEXT MD VISIT: In next couple of weeks to see PCP, per pt report  OBJECTIVE:  Note: Objective measures were completed at Evaluation unless otherwise noted.  HAND DOMINANCE: Right  ADLs: Overall ADLs: ind for eating and grooming using R hand, requires significant assistance for remaining ADLs Transfers/ambulation related to ADLs: total assistance for transfers, manual w/c - pt self-propels w/c with extra time Eating: ind Grooming: ind UB Dressing: some assistance LB Dressing: total assistance Toileting: total assistance, presence  of urinary catheter with foley bag and uses bed pan Bathing: total assistance Tub Shower transfers: n/a Equipment: Reacher and manual w/c  FUNCTIONAL OUTCOME MEASURES: Quick Dash: 40.9% deficit    UPPER EXTREMITY ROM:     Active ROM Right eval Left eval  Shoulder flexion WFL Per pt, unable d/t previous injuries from weightlifting. Pt reports "crackling" sensation without pain when attempting forward flex. Pt reports "it feels like it wants to come out of joint," which started recently.  Shoulder abduction    Shoulder adduction    Shoulder extension    Shoulder internal rotation    Shoulder external rotation    Elbow flexion  WFL  Elbow extension  WFL  Wrist flexion  Pt reports 2 wrist surgeries (several years ago) with hardware from wrist to back of hand, which limits wrist flex/ext  Wrist extension    Wrist ulnar deviation    Wrist radial deviation    Wrist pronation  WFL  Wrist supination  impaired  (Blank rows = not tested)  Active ROM Right eval Left eval  Thumb MCP (0-60)  Deformity (hyperext) prevents flexion  Thumb IP (0-80)  WFL  Thumb Radial abd/add (0-55)     Thumb Palmar abd/add (0-45)     Thumb Opposition to Small Finger     Index MCP (0-90)     Index PIP (0-100)     Index DIP (0-70)      Long MCP (0-90)      Long PIP (0-100)      Long DIP (0-70)      Ring MCP (0-90)      Ring PIP (0-100)      Ring DIP (0-70)      Little MCP (0-90)      Little PIP (0-100)      Little DIP (0-70)      (Blank rows = not tested)   L hand: Composite fist flex - WFL for digits 1-5 Thumb and pointer finger AROM - WFL Severely limited ext with digits 3-5 AROM, improved ROM with PROM for digits 3-5 Digit 3 ext PROM: -25* ext   UPPER EXTREMITY MMT:     MMT Right eval Left eval  Shoulder flexion    Shoulder abduction    Shoulder adduction    Shoulder extension    Shoulder internal rotation    Shoulder external rotation    Middle trapezius    Lower trapezius     Elbow flexion    Elbow extension    Wrist flexion    Wrist extension    Wrist ulnar deviation    Wrist radial deviation    Wrist pronation    Wrist supination    (Blank rows = not tested)  HAND FUNCTION: Tip pinch: Right 10 lbs, Left: 10 lbs  COORDINATION: Unable to test d/t limited  L shoulder AROM for functional reach and discomfort with L shoulder movements  SENSATION: No difference in sensation between R and L hand noted per pt report.  EDEMA: Some swelling around L wrist  COGNITION: Overall cognitive status: Within functional limits for tasks assessed Areas of impairment:  Pt reports "big time" difficulties with memory and recalling simple words which "is quite annoying."  OBSERVATIONS: Pt in manual w/c self-propelled. Pt has catheter and foley bag.  Pt wearing sunglasses throughout eval. Pt demo'd heavy wet cough at times with pt reporting phlegm has "been there awhile." Pt reported taking steroids and breathing treatments to help with the cough. When OT completed PROM digit ext of pt's L hand, pt demo'd mild intermittent spasms of L fingers.   TODAY'S TREATMENT:                                                                                                                              DATE:   Eval only    PATIENT EDUCATION: Education details: OT role, POC Person educated: Patient Education method: Explanation Education comprehension: verbalized understanding  HOME EXERCISE PROGRAM: TBD  GOALS: Goals reviewed with patient? Yes  LONG TERM GOALS: Target date: 04/04/23 (dates extended to allow for scheduling)  Pt will be ind with self-PROM/AAROM HEP of L hand. Baseline: new to outpt OT Goal status: INITIAL  2. Pt will be fit for appropriate splint or positioning device to to prevent LUE contractures, prevent additional deformity, and to improve L hand comfort.  Baseline: new to outpt OT Goal status: INITIAL  2.  Pt will verbalize understanding of splint or  positioning device wear and care. Baseline: new to outpt OT Goal status: INITIAL  3.  Pt will verbalize and/or demo understanding of L hand hygiene strategies. Baseline: new to outpt OT Goal status: INITIAL   ASSESSMENT:  CLINICAL IMPRESSION: Patient is a 77 y.o. male who was seen today for occupational therapy evaluation for unspecified acquired L hand deformity. Hx includes but not limited to chronic neck pain, hx of cervical spine surgeries, traumatic osteoarthritis, CAD, Afib, Parkinson's Disease, vision loss of L eye, and spinal stenosis of lumbar region. Pt [facility] of [facility] and currently requires extensive assistance from staff to complete ADLs. Patient currently presents with low level of physical functioning demonstrating functional deficits and impairments as noted below. Pt would benefit from skilled OT services in the outpatient setting to work on impairments as noted below to decrease L hand discomfort and prevent further impairment of L hand.   PERFORMANCE DEFICITS: in functional skills including ADLs, IADLs, coordination, dexterity, edema, tone, ROM, strength, muscle spasms, flexibility, Fine motor control, Gross motor control, mobility, balance, body mechanics, endurance, decreased knowledge of use of DME, skin integrity, and UE functional use, cognitive skills including energy/drive and memory, and psychosocial skills including environmental adaptation.   IMPAIRMENTS: are limiting patient from ADLs, IADLs, work, play, leisure, and social participation.   COMORBIDITIES:  has co-morbidities such as chronic neck pain, hx of cervical spine surgeries, traumatic osteoarthritis, CAD, Afib, Parkinson's Disease, vision loss of L eye, and spinal stenosis of lumbar region  that affects occupational performance. Patient will benefit from skilled OT to address above impairments and improve overall function.  MODIFICATION OR ASSISTANCE TO COMPLETE EVALUATION: Min-Moderate  modification of tasks or assist with assess necessary to complete an evaluation.  OT OCCUPATIONAL PROFILE AND HISTORY: Problem focused assessment: Including review of records relating to presenting problem.  CLINICAL DECISION MAKING: LOW - limited treatment options, no task modification necessary  REHAB POTENTIAL: Fair d/t chronic conditions and complex medical hx  EVALUATION COMPLEXITY: Low      PLAN:  OT FREQUENCY: 1x/week  OT DURATION: 4 weeks + eval (dates extended to allow for scheduling)  PLANNED INTERVENTIONS: 97168 OT Re-evaluation, 97535 self care/ADL training, 02725 therapeutic exercise, 97530 therapeutic activity, 97112 neuromuscular re-education, 97140 manual therapy, 97035 ultrasound, 97018 paraffin, 36644 fluidotherapy, 97010 moist heat, 97760 Orthotics management and training, 03474 Splinting (initial encounter), [id number] Subsequent splinting/medication, passive range of motion, functional mobility training, energy conservation, patient/family education, and DME and/or AE instructions  RECOMMENDED OTHER SERVICES: N/A  CONSULTED AND AGREED WITH PLAN OF CARE: Patient  PLAN FOR NEXT SESSION:   ?Permission form for release of information to order potential pre-fab splint HEP - self-PROM/AAROM exercises Hand hygiene strategies Discuss splinting options   Wynetta Emery, OT 02/20/2023, 5:38 PM

## 2023-02-24 ENCOUNTER — Ambulatory Visit: Payer: No Typology Code available for payment source | Admitting: Physician Assistant

## 2023-02-24 ENCOUNTER — Telehealth: Payer: Self-pay | Admitting: Gastroenterology

## 2023-02-24 NOTE — Progress Notes (Deleted)
Celso Amy, PA-C [address]  [address]  [place], [place] [address]  Main: [phone]  Fax: [phone]   Primary Care Physician: Pearson Grippe, MD  Primary Gastroenterologist:  ***  CC:  HPI: Raymond Hays is a 77 y.o. male  Current Outpatient Medications  Medication Sig Dispense Refill   acetaminophen (TYLENOL) 325 MG tablet Take 650 mg by mouth every 4 (four) hours as needed for mild pain.     acetic acid 0.25 % irrigation Irrigate with 1 Application as directed every 12 (twelve) hours. (Flush foley catheter)     Albuterol Sulfate (PROAIR RESPICLICK) 108 (90 Base) MCG/ACT AEPB Inhale 2 puffs into the lungs every 4 (four) hours as needed (COPD).     allopurinol (ZYLOPRIM) 100 MG tablet Take 100 mg by mouth daily.     apixaban (ELIQUIS) 5 MG TABS tablet Take 5 mg by mouth 2 (two) times daily.      ascorbic acid (VITAMIN C) 500 MG tablet Take 500 mg by mouth daily.     atorvastatin (LIPITOR) 20 MG tablet Take 20 mg by mouth at bedtime.     Calcium Carbonate (CALCIUM 500 PO) Take 2 tablets by mouth every 6 (six) hours as needed (indigestion/heartburn).     carbidopa-levodopa (SINEMET IR) 25-100 MG tablet Take 1 tablet by mouth 3 (three) times daily before meals.     cetirizine (ZYRTEC) 5 MG tablet Take 5 mg by mouth at bedtime.     Cholecalciferol (VITAMIN D-3) 25 MCG (1000 UT) CAPS Take 1,000 Units by mouth daily.     citalopram (CELEXA) 10 MG tablet Take 10 mg by mouth at bedtime.     docusate sodium (COLACE) 100 MG capsule Take 100 mg by mouth daily.     Emollient (CETAPHIL) cream Apply 1 Application topically 2 (two) times daily as needed.     Eyelid Cleansers (OCUSOFT LID SCRUB ORIGINAL) PADS Place 1 Pad into both eyes every 12 (twelve) hours as needed (eyelid cleansing).     famotidine (PEPCID) 20 MG tablet Take 20 mg by mouth at bedtime.     ferrous sulfate 324 (65 Fe) MG TBEC Take 1 tablet by mouth daily.     fluticasone (FLONASE) 50 MCG/ACT nasal spray  Place 2 sprays into both nostrils daily.     Fluticasone-Umeclidin-Vilant 100-62.5-25 MCG/ACT AEPB Inhale 1 puff into the lungs daily.     furosemide (LASIX) 20 MG tablet Take 20 mg by mouth daily as needed for edema.     gabapentin (NEURONTIN) 800 MG tablet Take 800 mg by mouth 4 (four) times daily.     guaifenesin (ROBITUSSIN) 100 MG/5ML syrup Take 200 mg by mouth every 4 (four) hours as needed for cough.     hydrocortisone (ANUSOL-HC) 25 MG suppository Place 25 mg rectally every 4 (four) hours as needed for hemorrhoids or anal itching.     hydrOXYzine (ATARAX/VISTARIL) 25 MG tablet Take 25 mg by mouth every 6 (six) hours as needed for itching.     ipratropium (ATROVENT) 0.06 % nasal spray Place 2 sprays into both nostrils 3 (three) times daily.     lactase (LACTAID) 3000 units tablet Take 3,000 Units by mouth 3 (three) times daily with meals.     levothyroxine (SYNTHROID) 75 MCG tablet Take 75 mcg by mouth daily before breakfast.     lidocaine 4 % Place 1 patch onto the skin daily as needed (pain). (Remove after 12 hours)     melatonin 5 MG TABS  Take 10 mg by mouth at bedtime.     Menthol, Topical Analgesic, (BIOFREEZE) 4 % GEL Apply 1 application  topically 3 (three) times daily. (Apply to neck)     methocarbamol (ROBAXIN) 500 MG tablet Take 500 mg by mouth daily. At 1400     nortriptyline (PAMELOR) 10 MG capsule Take 20 mg by mouth at bedtime.     oxybutynin (DITROPAN) 5 MG tablet Take 5 mg by mouth 2 (two) times daily.     polyethylene glycol (MIRALAX / GLYCOLAX) 17 g packet Take 17 g by mouth daily.     pramoxine (SARNA SENSITIVE) 1 % LOTN Apply 1 Application topically 4 (four) times daily as needed.     Probiotic Product (ACIDOPHILUS PROBIOTIC BLEND) CAPS Take 1 capsule by mouth daily.     Propylene Glycol 0.6 % SOLN Place 2 drops into both eyes in the morning, at noon, and at bedtime.     psyllium (METAMUCIL SMOOTH TEXTURE) 58.6 % powder Take 1 packet by mouth 3 (three) times daily. 283  g 1   selenium sulfide (SELSUN) 1 % LOTN Apply 1 Application topically 2 (two) times a week. (Tuesday and Friday)     senna (SENOKOT) 8.6 MG TABS tablet Take 1 tablet (8.6 mg total) by mouth daily as needed for mild constipation. (Patient taking differently: Take 1 tablet by mouth 2 (two) times daily as needed for mild constipation.) 120 tablet 0   silver sulfADIAZINE (SILVADENE) 1 % cream Apply 1 Application topically daily. (Apply to arms and face)     sodium chloride (OCEAN) 0.65 % SOLN nasal spray Place 2 sprays into both nostrils every 6 (six) hours as needed for congestion.     tacrolimus (PROTOPIC) 0.03 % ointment Apply 1 application  topically daily. (Apply to eyelids)     terbinafine (LAMISIL) 1 % cream Apply 1 Application topically at bedtime. (Apply to feet)     tiZANidine (ZANAFLEX) 2 MG tablet Take 2 mg by mouth 2 (two) times daily as needed for muscle spasms.     triamcinolone cream (KENALOG) 0.1 % Apply 1 Application topically daily as needed. Apply to arms, legs, and back r/t rash     No current facility-administered medications for this visit.    Allergies as of 02/24/2023   (No Known Allergies)    Past Medical History:  Diagnosis Date   Allergic rhinitis    Anxiety    Arthritis    Atrial fibrillation (HCC)    Chronic indwelling Foley catheter    COPD (chronic obstructive pulmonary disease) (HCC)    Cough    Diabetes (HCC)    Essential (primary) hypertension    Hemorrhoid    Hyperlipidemia    PAF (paroxysmal atrial fibrillation) (HCC)    Primary osteoarthritis, unspecified shoulder    Retention of urine, unspecified    Spinal stenosis    UTI (urinary tract infection)     Past Surgical History:  Procedure Laterality Date   APPENDECTOMY     BACK SURGERY     COLONOSCOPY N/A 10/27/2020   Procedure: COLONOSCOPY;  Surgeon: Wyline Mood, MD;  Location: [facility] ENDOSCOPY;  Service: Gastroenterology;  Laterality: N/A;   ELBOW SURGERY     ESOPHAGOGASTRODUODENOSCOPY (EGD)  WITH PROPOFOL N/A 10/27/2020   Procedure: ESOPHAGOGASTRODUODENOSCOPY (EGD) WITH PROPOFOL;  Surgeon: Wyline Mood, MD;  Location: [facility] ENDOSCOPY;  Service: Gastroenterology;  Laterality: N/A;   HERNIA REPAIR     KNEE SURGERY     neck     PARTIAL HIP  ARTHROPLASTY Bilateral    TEE WITHOUT CARDIOVERSION N/A 10/19/2020   Procedure: TRANSESOPHAGEAL ECHOCARDIOGRAM (TEE);  Surgeon: Antonieta Iba, MD;  Location: [facility] ORS;  Service: Cardiovascular;  Laterality: N/A;   TONSILLECTOMY      Review of Systems:    All systems reviewed and negative except where noted in HPI.   Physical Examination:   There were no vitals taken for this visit.  General: Well-nourished, well-developed in no acute distress.  Lungs: Clear to auscultation bilaterally. Non-labored. Heart: Regular rate and rhythm, no murmurs rubs or gallops.  Abdomen: Bowel sounds are normal; Abdomen is Soft; No hepatosplenomegaly, masses or hernias;  No Abdominal Tenderness; No guarding or rebound tenderness. Neuro: Alert and oriented x 3.  Grossly intact.  Psych: Alert and cooperative, normal mood and affect.   Imaging Studies: No results found.  Assessment and Plan:   Raymond Hays is a 77 y.o. y/o male ***    Celso Amy, PA-C  Follow up ***  BP check ***

## 2023-02-24 NOTE — Telephone Encounter (Signed)
The patient called and left a voicemail wanting to know about his appointment. I called the patient back to inform him we received his message, and he unavailable I spoke with the front desk at the nursing home she said that she will have him call back.

## 2023-02-25 ENCOUNTER — Ambulatory Visit: Payer: No Typology Code available for payment source | Admitting: Occupational Therapy

## 2023-03-05 ENCOUNTER — Ambulatory Visit: Payer: No Typology Code available for payment source | Admitting: Occupational Therapy

## 2023-03-10 ENCOUNTER — Emergency Department: Payer: No Typology Code available for payment source

## 2023-03-10 ENCOUNTER — Other Ambulatory Visit: Payer: Self-pay

## 2023-03-10 ENCOUNTER — Inpatient Hospital Stay: Payer: No Typology Code available for payment source

## 2023-03-10 ENCOUNTER — Inpatient Hospital Stay
Admission: EM | Admit: 2023-03-10 | Discharge: 2023-03-16 | DRG: 194 | Disposition: A | Discharge status: Skilled Nursing Facility | Payer: No Typology Code available for payment source | Source: Skilled Nursing Facility | Attending: Internal Medicine | Admitting: Internal Medicine

## 2023-03-10 DIAGNOSIS — R0602 Shortness of breath: Secondary | ICD-10-CM | POA: Diagnosis present

## 2023-03-10 DIAGNOSIS — Z7901 Long term (current) use of anticoagulants: Secondary | ICD-10-CM

## 2023-03-10 DIAGNOSIS — Z79899 Other long term (current) drug therapy: Secondary | ICD-10-CM | POA: Diagnosis not present

## 2023-03-10 DIAGNOSIS — Z87891 Personal history of nicotine dependence: Secondary | ICD-10-CM | POA: Diagnosis not present

## 2023-03-10 DIAGNOSIS — J9611 Chronic respiratory failure with hypoxia: Secondary | ICD-10-CM | POA: Diagnosis present

## 2023-03-10 DIAGNOSIS — E785 Hyperlipidemia, unspecified: Secondary | ICD-10-CM | POA: Diagnosis present

## 2023-03-10 DIAGNOSIS — J189 Pneumonia, unspecified organism: Secondary | ICD-10-CM | POA: Diagnosis present

## 2023-03-10 DIAGNOSIS — E038 Other specified hypothyroidism: Secondary | ICD-10-CM | POA: Diagnosis not present

## 2023-03-10 DIAGNOSIS — T380X5A Adverse effect of glucocorticoids and synthetic analogues, initial encounter: Secondary | ICD-10-CM | POA: Diagnosis not present

## 2023-03-10 DIAGNOSIS — Z6834 Body mass index (BMI) 34.0-34.9, adult: Secondary | ICD-10-CM

## 2023-03-10 DIAGNOSIS — Z9359 Other cystostomy status: Secondary | ICD-10-CM | POA: Diagnosis not present

## 2023-03-10 DIAGNOSIS — Z1152 Encounter for screening for COVID-19: Secondary | ICD-10-CM | POA: Diagnosis not present

## 2023-03-10 DIAGNOSIS — E1140 Type 2 diabetes mellitus with diabetic neuropathy, unspecified: Secondary | ICD-10-CM | POA: Diagnosis present

## 2023-03-10 DIAGNOSIS — Z79890 Hormone replacement therapy (postmenopausal): Secondary | ICD-10-CM

## 2023-03-10 DIAGNOSIS — I480 Paroxysmal atrial fibrillation: Secondary | ICD-10-CM | POA: Diagnosis present

## 2023-03-10 DIAGNOSIS — J441 Chronic obstructive pulmonary disease with (acute) exacerbation: Secondary | ICD-10-CM | POA: Diagnosis present

## 2023-03-10 DIAGNOSIS — Z96643 Presence of artificial hip joint, bilateral: Secondary | ICD-10-CM | POA: Diagnosis present

## 2023-03-10 DIAGNOSIS — R262 Difficulty in walking, not elsewhere classified: Secondary | ICD-10-CM | POA: Diagnosis not present

## 2023-03-10 DIAGNOSIS — L89611 Pressure ulcer of right heel, stage 1: Secondary | ICD-10-CM | POA: Diagnosis present

## 2023-03-10 DIAGNOSIS — K5900 Constipation, unspecified: Secondary | ICD-10-CM | POA: Diagnosis present

## 2023-03-10 DIAGNOSIS — E669 Obesity, unspecified: Secondary | ICD-10-CM | POA: Diagnosis present

## 2023-03-10 DIAGNOSIS — I4891 Unspecified atrial fibrillation: Principal | ICD-10-CM

## 2023-03-10 DIAGNOSIS — L89621 Pressure ulcer of left heel, stage 1: Secondary | ICD-10-CM | POA: Diagnosis present

## 2023-03-10 DIAGNOSIS — N3289 Other specified disorders of bladder: Secondary | ICD-10-CM | POA: Diagnosis present

## 2023-03-10 DIAGNOSIS — G20B1 Parkinson's disease with dyskinesia, without mention of fluctuations: Secondary | ICD-10-CM | POA: Diagnosis not present

## 2023-03-10 DIAGNOSIS — D72829 Elevated white blood cell count, unspecified: Secondary | ICD-10-CM | POA: Diagnosis present

## 2023-03-10 DIAGNOSIS — G20A1 Parkinson's disease without dyskinesia, without mention of fluctuations: Secondary | ICD-10-CM | POA: Diagnosis present

## 2023-03-10 DIAGNOSIS — J440 Chronic obstructive pulmonary disease with acute lower respiratory infection: Secondary | ICD-10-CM | POA: Diagnosis present

## 2023-03-10 DIAGNOSIS — L8990 Pressure ulcer of unspecified site, unspecified stage: Secondary | ICD-10-CM | POA: Diagnosis not present

## 2023-03-10 DIAGNOSIS — Y92239 Unspecified place in hospital as the place of occurrence of the external cause: Secondary | ICD-10-CM | POA: Diagnosis not present

## 2023-03-10 DIAGNOSIS — R9431 Abnormal electrocardiogram [ECG] [EKG]: Secondary | ICD-10-CM | POA: Diagnosis not present

## 2023-03-10 DIAGNOSIS — D649 Anemia, unspecified: Secondary | ICD-10-CM | POA: Diagnosis present

## 2023-03-10 DIAGNOSIS — R000 Tachycardia, unspecified: Secondary | ICD-10-CM | POA: Diagnosis present

## 2023-03-10 DIAGNOSIS — E1142 Type 2 diabetes mellitus with diabetic polyneuropathy: Secondary | ICD-10-CM | POA: Diagnosis present

## 2023-03-10 DIAGNOSIS — Z7401 Bed confinement status: Secondary | ICD-10-CM

## 2023-03-10 DIAGNOSIS — I10 Essential (primary) hypertension: Secondary | ICD-10-CM | POA: Diagnosis present

## 2023-03-10 DIAGNOSIS — R7989 Other specified abnormal findings of blood chemistry: Secondary | ICD-10-CM | POA: Diagnosis present

## 2023-03-10 DIAGNOSIS — Z9981 Dependence on supplemental oxygen: Secondary | ICD-10-CM

## 2023-03-10 DIAGNOSIS — N319 Neuromuscular dysfunction of bladder, unspecified: Secondary | ICD-10-CM | POA: Diagnosis present

## 2023-03-10 DIAGNOSIS — E871 Hypo-osmolality and hyponatremia: Secondary | ICD-10-CM | POA: Diagnosis present

## 2023-03-10 DIAGNOSIS — E039 Hypothyroidism, unspecified: Secondary | ICD-10-CM | POA: Diagnosis present

## 2023-03-10 DIAGNOSIS — N179 Acute kidney failure, unspecified: Secondary | ICD-10-CM | POA: Diagnosis present

## 2023-03-10 DIAGNOSIS — J449 Chronic obstructive pulmonary disease, unspecified: Secondary | ICD-10-CM | POA: Diagnosis present

## 2023-03-10 LAB — LACTIC ACID, PLASMA: Lactic Acid, Venous: 2.2 mmol/L (ref 0.5–1.9)

## 2023-03-10 LAB — RESP PANEL BY RT-PCR (RSV, FLU A&B, COVID)  RVPGX2
Influenza A by PCR: NEGATIVE
Influenza B by PCR: NEGATIVE
Resp Syncytial Virus by PCR: NEGATIVE
SARS Coronavirus 2 by RT PCR: NEGATIVE

## 2023-03-10 LAB — CBC WITH DIFFERENTIAL/PLATELET
Abs Immature Granulocytes: 0.06 10*3/uL (ref 0.00–0.07)
Basophils Absolute: 0 10*3/uL (ref 0.0–0.1)
Basophils Relative: 0 %
Eosinophils Absolute: 0 10*3/uL (ref 0.0–0.5)
Eosinophils Relative: 0 %
HCT: 37.8 % — ABNORMAL LOW (ref 39.0–52.0)
Hemoglobin: 12.5 g/dL — ABNORMAL LOW (ref 13.0–17.0)
Immature Granulocytes: 1 %
Lymphocytes Relative: 8 %
Lymphs Abs: 0.9 10*3/uL (ref 0.7–4.0)
MCH: 29.4 pg (ref 26.0–34.0)
MCHC: 33.1 g/dL (ref 30.0–36.0)
MCV: 88.9 fL (ref 80.0–100.0)
Monocytes Absolute: 0.4 10*3/uL (ref 0.1–1.0)
Monocytes Relative: 4 %
Neutro Abs: 9.3 10*3/uL — ABNORMAL HIGH (ref 1.7–7.7)
Neutrophils Relative %: 87 %
Platelets: 323 10*3/uL (ref 150–400)
RBC: 4.25 MIL/uL (ref 4.22–5.81)
RDW: 17.8 % — ABNORMAL HIGH (ref 11.5–15.5)
WBC: 10.7 10*3/uL — ABNORMAL HIGH (ref 4.0–10.5)
nRBC: 0 % (ref 0.0–0.2)

## 2023-03-10 LAB — COMPREHENSIVE METABOLIC PANEL
ALT: 12 U/L (ref 0–44)
AST: 24 U/L (ref 15–41)
Albumin: 3.6 g/dL (ref 3.5–5.0)
Alkaline Phosphatase: 103 U/L (ref 38–126)
Anion gap: 11 (ref 5–15)
BUN: 40 mg/dL — ABNORMAL HIGH (ref 8–23)
CO2: 27 mmol/L (ref 22–32)
Calcium: 9.2 mg/dL (ref 8.9–10.3)
Chloride: 96 mmol/L — ABNORMAL LOW (ref 98–111)
Creatinine, Ser: 1.3 mg/dL — ABNORMAL HIGH (ref 0.61–1.24)
GFR, Estimated: 56 mL/min — ABNORMAL LOW (ref >60)
Glucose, Bld: 174 mg/dL — ABNORMAL HIGH (ref 70–99)
Potassium: 4.5 mmol/L (ref 3.5–5.1)
Sodium: 134 mmol/L — ABNORMAL LOW (ref 135–145)
Total Bilirubin: 0.8 mg/dL (ref <1.2)
Total Protein: 7.7 g/dL (ref 6.5–8.1)

## 2023-03-10 LAB — TROPONIN I (HIGH SENSITIVITY)
Troponin I (High Sensitivity): 19 ng/L — ABNORMAL HIGH (ref <18)
Troponin I (High Sensitivity): 16 ng/L (ref <18)

## 2023-03-10 LAB — PROCALCITONIN: Procalcitonin: <0.1 ng/mL

## 2023-03-10 LAB — MAGNESIUM: Magnesium: 2 mg/dL (ref 1.7–2.4)

## 2023-03-10 LAB — BRAIN NATRIURETIC PEPTIDE: B Natriuretic Peptide: 167.1 pg/mL — ABNORMAL HIGH (ref 0.0–100.0)

## 2023-03-10 MED ORDER — OXYBUTYNIN CHLORIDE 5 MG PO TABS
5.0000 mg | ORAL_TABLET | Freq: Two times a day (BID) | ORAL | Status: DC
  Administered 2023-03-10 – 2023-03-16 (×12): 5 mg via ORAL
  Filled 2023-03-10 (×12): qty 1

## 2023-03-10 MED ORDER — ACETIC ACID 0.25 % IR SOLN: 1.0000 | Freq: Two times a day (BID) | Status: DC

## 2023-03-10 MED ORDER — CARBIDOPA-LEVODOPA 25-100 MG PO TABS
1.0000 | ORAL_TABLET | Freq: Three times a day (TID) | ORAL | Status: DC
Start: 2023-03-11 — End: 2023-03-16
  Administered 2023-03-11 – 2023-03-16 (×17): 1 via ORAL
  Filled 2023-03-10 (×19): qty 1

## 2023-03-10 MED ORDER — ALLOPURINOL 100 MG PO TABS
100.0000 mg | ORAL_TABLET | Freq: Every day | ORAL | Status: DC
  Administered 2023-03-11 – 2023-03-16 (×6): 100 mg via ORAL
  Filled 2023-03-10 (×6): qty 1

## 2023-03-10 MED ORDER — DILTIAZEM HCL-DEXTROSE 125-5 MG/125ML-% IV SOLN (PREMIX)
5.0000 mg/h | INTRAVENOUS | Status: DC
  Administered 2023-03-10: 5 mg/h via INTRAVENOUS
  Administered 2023-03-11: 7.5 mg/h via INTRAVENOUS
  Filled 2023-03-10 (×2): qty 125

## 2023-03-10 MED ORDER — INSULIN ASPART 100 UNIT/ML IJ SOLN: 0.0000 [IU] | INTRAMUSCULAR | Status: DC | PRN

## 2023-03-10 MED ORDER — UMECLIDINIUM BROMIDE 62.5 MCG/ACT IN AEPB
1.0000 | INHALATION_SPRAY | Freq: Every day | RESPIRATORY_TRACT | Status: DC
  Filled 2023-03-10: qty 7

## 2023-03-10 MED ORDER — DOXYCYCLINE HYCLATE 100 MG IV SOLR
100.0000 mg | Freq: Once | INTRAVENOUS | Status: AC
  Administered 2023-03-10: 100 mg via INTRAVENOUS
  Filled 2023-03-10: qty 100

## 2023-03-10 MED ORDER — ACETAMINOPHEN 325 MG PO TABS
650.0000 mg | ORAL_TABLET | Freq: Four times a day (QID) | ORAL | Status: DC | PRN
Start: 2023-03-10 — End: 2023-03-16

## 2023-03-10 MED ORDER — METOPROLOL TARTRATE 5 MG/5ML IV SOLN
5.0000 mg | INTRAVENOUS | Status: DC | PRN
  Administered 2023-03-10: 5 mg via INTRAVENOUS
  Filled 2023-03-10 (×2): qty 5

## 2023-03-10 MED ORDER — SODIUM CHLORIDE 0.9 % IV SOLN
2.0000 g | Freq: Once | INTRAVENOUS | Status: AC
  Administered 2023-03-10: 2 g via INTRAVENOUS
  Filled 2023-03-10: qty 20

## 2023-03-10 MED ORDER — ACETAMINOPHEN 650 MG RE SUPP: 650.0000 mg | Freq: Four times a day (QID) | RECTAL | Status: DC | PRN

## 2023-03-10 MED ORDER — FLUTICASONE FUROATE-VILANTEROL 100-25 MCG/ACT IN AEPB
1.0000 | INHALATION_SPRAY | Freq: Every day | RESPIRATORY_TRACT | Status: DC
Start: 2023-03-11 — End: 2023-03-11
  Filled 2023-03-10: qty 28

## 2023-03-10 MED ORDER — GABAPENTIN 400 MG PO CAPS
800.0000 mg | ORAL_CAPSULE | Freq: Four times a day (QID) | ORAL | Status: DC
Start: 2023-03-10 — End: 2023-03-16
  Administered 2023-03-10 – 2023-03-16 (×22): 800 mg via ORAL
  Filled 2023-03-10 (×5): qty 2
  Filled 2023-03-10: qty 8
  Filled 2023-03-10 (×19): qty 2

## 2023-03-10 MED ORDER — APIXABAN 5 MG PO TABS
5.0000 mg | ORAL_TABLET | Freq: Two times a day (BID) | ORAL | Status: DC
  Administered 2023-03-10 – 2023-03-16 (×12): 5 mg via ORAL
  Filled 2023-03-10 (×12): qty 1

## 2023-03-10 MED ORDER — IOHEXOL 350 MG/ML SOLN
100.0000 mL | Freq: Once | INTRAVENOUS | Status: AC | PRN
  Administered 2023-03-10: 100 mL via INTRAVENOUS

## 2023-03-10 MED ORDER — LEVOTHYROXINE SODIUM 50 MCG PO TABS
75.0000 ug | ORAL_TABLET | Freq: Every day | ORAL | Status: DC
  Administered 2023-03-11 – 2023-03-16 (×5): 75 ug via ORAL
  Filled 2023-03-10: qty 1
  Filled 2023-03-10: qty 2
  Filled 2023-03-10 (×4): qty 1

## 2023-03-10 MED ORDER — LACTASE 3000 UNITS PO TABS
3000.0000 [IU] | ORAL_TABLET | Freq: Three times a day (TID) | ORAL | Status: DC
Start: 2023-03-11 — End: 2023-03-16
  Administered 2023-03-11 – 2023-03-16 (×16): 3000 [IU] via ORAL
  Filled 2023-03-10 (×21): qty 1

## 2023-03-10 NOTE — ED Triage Notes (Signed)
Pt to ED via [facility] from [facility] for c/o shob. Pt diagnosed with pneumonia on 8th and has had increasing shob since. Pt has hx COPD, 2L O2 at baseline, but not compliant.  HR 130-160 Afib  BP 128/87 Temp 98.4 Cbg 200

## 2023-03-10 NOTE — Assessment & Plan Note (Addendum)
Prn albuterol.  Cont 2 L baseline.

## 2023-03-10 NOTE — Assessment & Plan Note (Addendum)
Lab Results  Component Value Date   CREATININE 1.30 (H) 03/10/2023   CREATININE 0.97 12/09/2022   CREATININE 1.05 12/08/2022  Start pt on Low rate MIVF.

## 2023-03-10 NOTE — ED Provider Notes (Signed)
[facility] Provider Note    Event Date/Time   First MD Initiated Contact with Patient 03/10/23 1836     (approximate)   History   Chief Complaint Shortness of Breath   HPI  Raymond Hays is a 77 y.o. male with past medical history of hypertension, diabetes, paroxysmal atrial fibrillation, and COPD on 2L who presents to the ED complaining of shortness of breath.  Patient reports that he has had a cough productive of clear sputum with increasing difficulty breathing for about the past week.  He was diagnosed with pneumonia at [facility] and started on antibiotics, states symptoms have not improved since then.  He was referred to the ED this evening after being found to be in atrial fibrillation with RVR.  He denies any pain in his chest and has not had significant pain or swelling in his legs.     Physical Exam   Triage Vital Signs: ED Triage Vitals  Encounter Vitals Group     BP 03/10/23 1834 138/86     Systolic BP Percentile --      Diastolic BP Percentile --      Pulse Rate 03/10/23 1834 (!) 142     Resp 03/10/23 1834 16     Temp 03/10/23 1834 98.8 F (37.1 C)     Temp Source 03/10/23 1834 Oral     SpO2 03/10/23 1834 100 %     Weight 03/10/23 1837 221 lb 14.4 oz (100.7 kg)     Height 03/10/23 1837 5' 7.5" (1.715 m)     Head Circumference --      Peak Flow --      Pain Score 03/10/23 1837 0     Pain Loc --      Pain Education --      Exclude from Growth Chart --     Most recent vital signs: Vitals:   03/10/23 1900 03/10/23 1930  BP: (!) 155/118 105/75  Pulse: (!) 116 (!) 118  Resp: 17 (!) 21  Temp:    SpO2: 100% 100%    Constitutional: Alert and oriented. Eyes: Conjunctivae are normal. Head: Atraumatic. Nose: No congestion/rhinnorhea. Mouth/Throat: Mucous membranes are moist.  Cardiovascular: Tachycardic, irregularly irregular rhythm. Grossly normal heart sounds.  2+ radial pulses bilaterally. Respiratory: Normal respiratory  effort.  No retractions. Lungs with crackles to bilateral bases. Gastrointestinal: Soft and nontender. No distention. Musculoskeletal: No lower extremity tenderness, 2+ pitting edema to knees bilaterally. Neurologic:  Normal speech and language. No gross focal neurologic deficits are appreciated.    ED Results / Procedures / Treatments   Labs (all labs ordered are listed, but only abnormal results are displayed) Labs Reviewed  CBC WITH DIFFERENTIAL/PLATELET - Abnormal; Notable for the following components:      Result Value   WBC 10.7 (*)    Hemoglobin 12.5 (*)    HCT 37.8 (*)    RDW 17.8 (*)    Neutro Abs 9.3 (*)    All other components within normal limits  COMPREHENSIVE METABOLIC PANEL - Abnormal; Notable for the following components:   Sodium 134 (*)    Chloride 96 (*)    Glucose, Bld 174 (*)    BUN 40 (*)    Creatinine, Ser 1.30 (*)    GFR, Estimated 56 (*)    All other components within normal limits  BRAIN NATRIURETIC PEPTIDE - Abnormal; Notable for the following components:   B Natriuretic Peptide 167.1 (*)    All other  components within normal limits  TROPONIN I (HIGH SENSITIVITY) - Abnormal; Notable for the following components:   Troponin I (High Sensitivity) 19 (*)    All other components within normal limits  RESP PANEL BY RT-PCR (RSV, FLU A&B, COVID)  RVPGX2  PROCALCITONIN  TROPONIN I (HIGH SENSITIVITY)     EKG  ED ECG REPORT I, Chesley Noon, the attending physician, personally viewed and interpreted this ECG.   Date: 03/10/2023  EKG Time: 18:48  Rate: 120  Rhythm: atrial fibrillation  Axis: Normal  Intervals:none  ST&T Change: None  RADIOLOGY Chest x-ray reviewed and interpreted by me with right lower lobe infiltrate consistent with pneumonia.  PROCEDURES:  Critical Care performed: Yes, see critical care procedure note(s)  .Critical Care  Performed by: Chesley Noon, MD Authorized by: Chesley Noon, MD   Critical care provider  statement:    Critical care time (minutes):  30   Critical care time was exclusive of:  Separately billable procedures and treating other patients and teaching time   Critical care was necessary to treat or prevent imminent or life-threatening deterioration of the following conditions:  Cardiac failure   Critical care was time spent personally by me on the following activities:  Development of treatment plan with patient or surrogate, discussions with consultants, evaluation of patient's response to treatment, examination of patient, ordering and review of laboratory studies, ordering and review of radiographic studies, ordering and performing treatments and interventions, pulse oximetry, re-evaluation of patient's condition and review of old charts   I assumed direction of critical care for this patient from another provider in my specialty: no     Care discussed with: admitting provider      MEDICATIONS ORDERED IN ED: Medications  cefTRIAXone (ROCEPHIN) 2 g in sodium chloride 0.9 % 100 mL IVPB (2 g Intravenous New Bag/Given 03/10/23 2019)  doxycycline (VIBRAMYCIN) 100 mg in dextrose 5 % 250 mL IVPB (has no administration in time range)  diltiazem (CARDIZEM) 125 mg in dextrose 5% 125 mL (1 mg/mL) infusion (has no administration in time range)     IMPRESSION / MDM / ASSESSMENT AND PLAN / ED COURSE  I reviewed the triage vital signs and the nursing notes.                              77 y.o. male with past medical history of hypertension, diabetes, COPD on 2 L, and atrial fibrillation who presents to the ED complaining of increasing difficulty breathing with productive cough for the past week.  Patient's presentation is most consistent with acute presentation with potential threat to life or bodily function.  Differential diagnosis includes, but is not limited to, pneumonia, arrhythmia, ACS, PE, CHF, COPD, anemia, electrolyte abnormality, AKI.  Patient chronically ill-appearing but  nontoxic and in no acute distress, vital signs remarkable for tachycardia but otherwise reassuring.  He is not in any respiratory distress, does appear clinically fluid overloaded but maintaining oxygen saturations on his usual 2 L nasal cannula.  EKG shows atrial fibrillation with RVR and we will attempt to control heart rate with IV metoprolol.  Chest x-ray and labs are pending at this time, no ischemic changes noted on EKG.  Chest x-ray concerning for pneumonia and we will treat with IV Rocephin and doxycycline given his history of prolonged QT.  Labs show mild AKI without acute electrolyte abnormality, LFTs are unremarkable.  Troponin mildly elevated and we will trend but low suspicion for  ACS or PE at this time.  Mild leukocytosis without significant anemia noted.  Heart rate improving following IV metoprolol, will start on diltiazem drip for ongoing management of RVR.  Case discussed with hospitalist for admission.      FINAL CLINICAL IMPRESSION(S) / ED DIAGNOSES   Final diagnoses:  Atrial fibrillation with RVR (HCC)  Pneumonia of right lower lobe due to infectious organism     Rx / DC Orders   ED Discharge Orders     None        Note:  This document was prepared using Dragon voice recognition software and may include unintentional dictation errors.   Chesley Noon, MD 03/10/23 2046

## 2023-03-10 NOTE — Assessment & Plan Note (Addendum)
Will continue Eliquis. 

## 2023-03-10 NOTE — Assessment & Plan Note (Addendum)
Vitals:   03/10/23 1900 03/10/23 1930 03/10/23 2030 03/10/23 2130  BP: (!) 155/118 105/75 112/86 (!) 116/93   03/10/23 2200 03/10/23 2230 03/10/23 2330 03/11/23 0000  BP: 107/77 112/81 115/75 90/76   03/11/23 0030 03/11/23 0100 03/11/23 0130 03/11/23 0200  BP: (!) 89/44 103/67 107/71 (!) 126/90  Cont eliquis  and cardizem.

## 2023-03-10 NOTE — Assessment & Plan Note (Addendum)
Currently in A-fib RVR started on diltiazem drip in the emergency room. Pt 2 D echo in 08/2022: 1. Left ventricular ejection fraction, by estimation, is 55 to 60% . The left ventricle has normal function. The left ventricle has no regional wall motion abnormalities. Left ventricular diastolic parameters are indeterminate. 2. Pulmonary artery pressure is at least mildly to moderately elevated ( RVSP 35- 40% plus central venous/ right atrial pressure) . Right ventricular systolic function is low normal. The right ventricular size is moderately enlarged. 3. Left atrial size was severely dilated. 4. 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 There is at least mild to moderate mitral valve regurgitation, though severity may be underestimated due to jet eccentricity. No evidence of mitral stenosis. 5. Right atrial size was severely dilated. 6. Tricuspid valve regurgitation is moderate. 7. The aortic valve is tricuspid. Aortic valve regurgitation is not visualized. No aortic stenosis is present.

## 2023-03-10 NOTE — Assessment & Plan Note (Addendum)
Glycemic protocol.   

## 2023-03-10 NOTE — Assessment & Plan Note (Addendum)
Patient has history of prolonged QT and today it is 464.  Will obtain magnesium.

## 2023-03-10 NOTE — Hospital Course (Addendum)
77 y.o. male with medical history significant for osteoarthritis, paroxysmal atrial fibrillation, COPD on 2l [place] with exertion, type 2 diabetes mellitus, hypertension, dyslipidemia brought via EMS to [facility] emergency department for shortness of breath and tachycardia patient was noted to have a heart rate of 140s.  Upon evaluation in the emergency department patient was found to be in atrial fibrillation with rapid ventricular response.  Furthermore clinically the patient was felt to be suffering from a COPD exacerbation.  Chest x-ray additionally revealed evidence of right lower lobe pneumonia.  Patient was initiated on Cardizem infusion, bronchodilator therapy and initiated on intravenous antibiotics.  The hospitalist group was then called to assess the patient for admission to the hospital.    CT angiogram of the chest was performed revealing no evidence of pulmonary embolism but revealing impaction of the right middle and lower lobe bronchi with postobstructive atelectasis or pneumonia.  Chest physiotherapy was therefore ordered in addition to usage of a flutter valve.  As the patient's rate control improved patient was eventually transitioned from diltiazem infusion to oral diltiazem therapy.    In the days that followed, management of patient's pneumonia was complicated by extremely harsh cough with significant difficulty in mobilizing secretions.  Case was discussed with Dr. Aundria Rud and patient was managed with an aggressive regimen of vest therapy and nebulized saline alongside Acapella valve therapy bronchodilators and systemic steroids.

## 2023-03-10 NOTE — Assessment & Plan Note (Addendum)
Cont sinemet IR and gabapentin.

## 2023-03-10 NOTE — Assessment & Plan Note (Addendum)
Continue levothyroxine at 75 mcg.  ?

## 2023-03-10 NOTE — Assessment & Plan Note (Addendum)
    Latest Ref Rng & Units 03/10/2023    6:41 PM 12/09/2022    4:54 AM 12/08/2022    5:08 AM  CBC  WBC 4.0 - 10.5 K/uL 10.7  6.4  6.3   Hemoglobin 13.0 - 17.0 g/dL 16.1  09.6  04.5   Hematocrit 39.0 - 52.0 % 37.8  36.1  39.3   Platelets 150 - 400 K/uL 323  261  258   Will follow. Resume po iron once pt is stable  and a.fib rvr and pneumonia is resolved.

## 2023-03-10 NOTE — H&P (Incomplete)
History and Physical    Patient: Raymond Hays ZOX:[id number] DOB: [birth year] DOA: 03/10/2023 DOS: the patient was seen and examined on 03/10/2023 PCP: Pearson Grippe, MD  Patient coming from: [facility]  Chief Complaint:  Chief Complaint  Patient presents with   Shortness of Breath   HPI: Raymond Hays is a 77 y.o. male with medical history significant for osteoarthritis, paroxysmal atrial fibrillation, COPD, type diabetes mellitus, hypertension, dyslipidemia and spinal stenosis, brought via EMS for shortness of breath and tachycardia patient was noted to have a heart rate of 140s at his facility and increasing shortness of breath patient has COPD and is on chronic oxygen at 2 L baseline but uses it intermittently.  Initial vitals were reported to be heart rate of 1 30-1 60 A-fib RVR blood pressure 128/87 afebrile at 98.4 and blood glucose of 200.  Patient does participate and give history and reports shortness of breath cough sputum that is clear in color.  Per report he has been diagnosed with pneumonia and has been on antibiotics off and on has been receiving treatment for pneumonia.   In emergency room vitals trend shows: Vitals:   03/10/23 1845 03/10/23 1900 03/10/23 1930 03/10/23 2030  BP:  (!) 155/118 105/75 112/86  Pulse: (!) 139 (!) 116 (!) 118 (!) 116  Temp:      Resp: 17 17 (!) 21 18  Height:      Weight:      SpO2: 100% 100% 100% 100%  TempSrc:      BMI (Calculated):      EKG shows A-fib RVR with a heart rate of 120 normal axis and normal intervals no ST-T wave changes. Chest x-ray shows right lower lobe pneumonia. Labs are notable for : -Metabolic panel showing mild hyponatremia at 134 chloride 96 glucose 174 BUN 40 creatinine 1.30 EGFR of 56 and normal LFTs. - BNP of 167.1, troponin of 19. - Procalcitonin of less than 0.10. - CBC shows a white count of 10.7 anemia with a hemoglobin of 12.5 RDW 17.8 normal platelets at 323 normal differential.  In the ED pt  received: Medications  doxycycline (VIBRAMYCIN) 100 mg in dextrose 5 % 250 mL IVPB (100 mg Intravenous New Bag/Given 03/10/23 2051)  diltiazem (CARDIZEM) 125 mg in dextrose 5% 125 mL (1 mg/mL) infusion (5 mg/hr Intravenous New Bag/Given 03/10/23 2050)  acetic acid 0.25 % irrigation 1 Application (has no administration in time range)  allopurinol (ZYLOPRIM) tablet 100 mg (has no administration in time range)  oxybutynin (DITROPAN) tablet 5 mg (has no administration in time range)  levothyroxine (SYNTHROID) tablet 75 mcg (has no administration in time range)  lactase (LACTAID) tablet 3,000 Units (has no administration in time range)  gabapentin (NEURONTIN) capsule 800 mg (has no administration in time range)  fluticasone furoate-vilanterol (BREO ELLIPTA) 100-25 MCG/ACT 1 puff (has no administration in time range)    And  umeclidinium bromide (INCRUSE ELLIPTA) 62.5 MCG/ACT 1 puff (has no administration in time range)  carbidopa-levodopa (SINEMET IR) 25-100 MG per tablet immediate release 1 tablet (has no administration in time range)  apixaban (ELIQUIS) tablet 5 mg (has no administration in time range)  cefTRIAXone (ROCEPHIN) 2 g in sodium chloride 0.9 % 100 mL IVPB (0 g Intravenous Stopped 03/10/23 2050)   Review of Systems  Cardiovascular:  Positive for palpitations.   Past Medical History:  Diagnosis Date   Allergic rhinitis    Anxiety    Arthritis    Atrial fibrillation (HCC)  Chronic indwelling Foley catheter    COPD (chronic obstructive pulmonary disease) (HCC)    Cough    Diabetes (HCC)    Essential (primary) hypertension    Hemorrhoid    Hyperlipidemia    PAF (paroxysmal atrial fibrillation) (HCC)    Primary osteoarthritis, unspecified shoulder    Retention of urine, unspecified    Spinal stenosis    UTI (urinary tract infection)    Past Surgical History:  Procedure Laterality Date   APPENDECTOMY     BACK SURGERY     COLONOSCOPY N/A 10/27/2020   Procedure: COLONOSCOPY;   Surgeon: Wyline Mood, MD;  Location: [facility] ENDOSCOPY;  Service: Gastroenterology;  Laterality: N/A;   ELBOW SURGERY     ESOPHAGOGASTRODUODENOSCOPY (EGD) WITH PROPOFOL N/A 10/27/2020   Procedure: ESOPHAGOGASTRODUODENOSCOPY (EGD) WITH PROPOFOL;  Surgeon: Wyline Mood, MD;  Location: [facility] ENDOSCOPY;  Service: Gastroenterology;  Laterality: N/A;   HERNIA REPAIR     KNEE SURGERY     neck     PARTIAL HIP ARTHROPLASTY Bilateral    TEE WITHOUT CARDIOVERSION N/A 10/19/2020   Procedure: TRANSESOPHAGEAL ECHOCARDIOGRAM (TEE);  Surgeon: Antonieta Iba, MD;  Location: [facility] ORS;  Service: Cardiovascular;  Laterality: N/A;   TONSILLECTOMY      reports that he quit smoking about 18 years ago. His smoking use included cigarettes. He started smoking about 28 years ago. He has a 5 pack-year smoking history. He has never used smokeless tobacco. He reports that he does not currently use alcohol. He reports that he does not currently use drugs after having used the following drugs: "Crack" cocaine.  No Known Allergies  Family History  Problem Relation Age of Onset   Healthy Son    Healthy Son     Prior to Admission medications   Medication Sig Start Date End Date Taking? Authorizing Provider  acetaminophen (TYLENOL) 325 MG tablet Take 650 mg by mouth every 4 (four) hours as needed for mild pain.    [provider]  acetic acid 0.25 % irrigation Irrigate with 1 Application as directed every 12 (twelve) hours. (Flush foley catheter)    [provider]  Albuterol Sulfate (PROAIR RESPICLICK) 108 (90 Base) MCG/ACT AEPB Inhale 2 puffs into the lungs every 4 (four) hours as needed (COPD).    [provider]  allopurinol (ZYLOPRIM) 100 MG tablet Take 100 mg by mouth daily.    [provider]  apixaban (ELIQUIS) 5 MG TABS tablet Take 5 mg by mouth 2 (two) times daily.     [provider]  ascorbic acid (VITAMIN C) 500 MG tablet Take 500 mg by mouth daily.    [provider]  atorvastatin (LIPITOR) 20 MG tablet Take 20 mg by mouth at bedtime.    [provider]  Calcium Carbonate (CALCIUM 500 PO) Take 2 tablets by mouth every 6 (six) hours as needed (indigestion/heartburn).    [provider]  carbidopa-levodopa (SINEMET IR) 25-100 MG tablet Take 1 tablet by mouth 3 (three) times daily before meals.    [provider]  cetirizine (ZYRTEC) 5 MG tablet Take 5 mg by mouth at bedtime.    [provider]  Cholecalciferol (VITAMIN D-3) 25 MCG (1000 UT) CAPS Take 1,000 Units by mouth daily.    [provider]  citalopram (CELEXA) 10 MG tablet Take 10 mg by mouth at bedtime.    [provider]  docusate sodium (COLACE) 100 MG capsule Take 100 mg by mouth daily.    [provider]  Emollient (CETAPHIL) cream Apply 1 Application topically 2 (two) times daily as needed.    [provider]  Eyelid Cleansers (OCUSOFT LID SCRUB ORIGINAL) PADS Place 1 Pad into both eyes every 12 (twelve) hours as needed (eyelid cleansing).    [provider]  famotidine (PEPCID) 20 MG tablet Take 20 mg by mouth at bedtime.    [provider]  ferrous sulfate 324 (65 Fe) MG TBEC Take 1 tablet by mouth daily. 12/05/22   [provider]  fluticasone (FLONASE) 50 MCG/ACT nasal spray Place 2 sprays into both nostrils daily.    [provider]  Fluticasone-Umeclidin-Vilant 100-62.5-25 MCG/ACT AEPB Inhale 1 puff into the lungs daily.    [provider]  furosemide (LASIX) 20 MG tablet Take 20 mg by mouth daily as needed for edema. 10/21/22   [provider]  gabapentin (NEURONTIN) 800 MG tablet Take 800 mg by mouth 4 (four) times daily.    [provider]  guaifenesin (ROBITUSSIN) 100 MG/5ML syrup Take 200 mg by mouth every 4 (four) hours as needed for cough.    [provider]  hydrocortisone (ANUSOL-HC) 25 MG suppository Place 25 mg rectally every 4  (four) hours as needed for hemorrhoids or anal itching.    [provider]  hydrOXYzine (ATARAX/VISTARIL) 25 MG tablet Take 25 mg by mouth every 6 (six) hours as needed for itching. 02/17/20   [provider]  ipratropium (ATROVENT) 0.06 % nasal spray Place 2 sprays into both nostrils 3 (three) times daily. 11/04/22   [provider]  lactase (LACTAID) 3000 units tablet Take 3,000 Units by mouth 3 (three) times daily with meals.    [provider]  levothyroxine (SYNTHROID) 75 MCG tablet Take 75 mcg by mouth daily before breakfast.    [provider]  lidocaine 4 % Place 1 patch onto the skin daily as needed (pain). (Remove after 12 hours)    [provider]  melatonin 5 MG TABS Take 10 mg by mouth at bedtime.    [provider]  Menthol, Topical Analgesic, (BIOFREEZE) 4 % GEL Apply 1 application  topically 3 (three) times daily. (Apply to neck)    [provider]  methocarbamol (ROBAXIN) 500 MG tablet Take 500 mg by mouth daily. At 1400    [provider]  nortriptyline (PAMELOR) 10 MG capsule Take 20 mg by mouth at bedtime. 12/05/22   [provider]  oxybutynin (DITROPAN) 5 MG tablet Take 5 mg by mouth 2 (two) times daily.    [provider]  polyethylene glycol (MIRALAX / GLYCOLAX) 17 g packet Take 17 g by mouth daily.    [provider]  pramoxine (SARNA SENSITIVE) 1 % LOTN Apply 1 Application topically 4 (four) times daily as needed.    [provider]  Probiotic Product (ACIDOPHILUS PROBIOTIC BLEND) CAPS Take 1 capsule by mouth daily. 12/05/22   [provider]  Propylene Glycol 0.6 % SOLN Place 2 drops into both eyes in the morning, at noon, and at bedtime.    [provider]  psyllium (METAMUCIL SMOOTH TEXTURE) 58.6 % powder Take 1 packet by mouth 3 (three) times daily. 09/29/22   Chesley Noon, MD  selenium sulfide (SELSUN) 1 % LOTN Apply 1 Application topically  2 (two) times a week. (Tuesday and Friday)    [provider]  senna (SENOKOT) 8.6 MG TABS tablet Take 1 tablet (8.6 mg total) by mouth daily as needed for mild constipation.  Patient taking differently: Take 1 tablet by mouth 2 (two) times daily as needed for mild constipation. 09/29/22   Chesley Noon, MD  silver sulfADIAZINE (SILVADENE) 1 % cream Apply 1 Application topically daily. (Apply to arms and face)    [provider]  sodium chloride (OCEAN) 0.65 % SOLN nasal spray Place 2 sprays into both nostrils every 6 (six) hours as needed for congestion.    [provider]  tacrolimus (PROTOPIC) 0.03 % ointment Apply 1 application  topically daily. (Apply to eyelids)    [provider]  terbinafine (LAMISIL) 1 % cream Apply 1 Application topically at bedtime. (Apply to feet)    [provider]  tiZANidine (ZANAFLEX) 2 MG tablet Take 2 mg by mouth 2 (two) times daily as needed for muscle spasms.    [provider]  triamcinolone cream (KENALOG) 0.1 % Apply 1 Application topically daily as needed. Apply to arms, legs, and back r/t rash    [provider]     Vitals:   03/10/23 1845 03/10/23 1900 03/10/23 1930 03/10/23 2030  BP:  (!) 155/118 105/75 112/86  Pulse: (!) 139 (!) 116 (!) 118 (!) 116  Resp: 17 17 (!) 21 18  Temp:      TempSrc:      SpO2: 100% 100% 100% 100%  Weight:      Height:       Physical Exam Vitals and nursing note reviewed.  Constitutional:      General: He is not in acute distress.    Appearance: He is obese. He is ill-appearing.  HENT:     Head: Normocephalic and atraumatic.     Right Ear: Hearing normal.     Left Ear: Hearing normal.     Nose: Nose normal. No nasal deformity.     Mouth/Throat:     Lips: Pink.     Tongue: No lesions.     Pharynx: Oropharynx is clear.  Eyes:     General: Lids are normal.     Extraocular Movements: Extraocular movements intact.  Cardiovascular:     Rate and  Rhythm: Tachycardia present. Rhythm irregular.     Heart sounds: Normal heart sounds.  Pulmonary:     Effort: Pulmonary effort is normal.     Breath sounds: Normal breath sounds.  Abdominal:     General: Bowel sounds are normal. There is no distension.     Palpations: Abdomen is soft. There is no mass.     Tenderness: There is no abdominal tenderness.  Musculoskeletal:     Right lower leg: No edema.     Left lower leg: No edema.  Skin:    General: Skin is warm.  Neurological:     General: No focal deficit present.     Mental Status: He is alert and oriented to person, place, and time.     Cranial Nerves: Cranial nerves 2-12 are intact.  Psychiatric:        Attention and Perception: Attention normal.        Mood and Affect: Mood normal.        Speech: Speech normal.        Behavior: Behavior normal. Behavior is cooperative.      Labs on Admission: I have personally reviewed following labs and imaging studies Results for orders placed or performed during the hospital encounter of 03/10/23 (from the past 24 hour(s))  Resp panel by RT-PCR (RSV, Flu A&B, Covid) Anterior Nasal Swab     Status:  None   Collection Time: 03/10/23  6:41 PM   Specimen: Anterior Nasal Swab  Result Value Ref Range   SARS Coronavirus 2 by RT PCR NEGATIVE NEGATIVE   Influenza A by PCR NEGATIVE NEGATIVE   Influenza B by PCR NEGATIVE NEGATIVE   Resp Syncytial Virus by PCR NEGATIVE NEGATIVE  CBC with Differential     Status: Abnormal   Collection Time: 03/10/23  6:41 PM  Result Value Ref Range   WBC 10.7 (H) 4.0 - 10.5 K/uL   RBC 4.25 4.22 - 5.81 MIL/uL   Hemoglobin 12.5 (L) 13.0 - 17.0 g/dL   HCT 30.8 (L) 65.7 - 84.6 %   MCV 88.9 80.0 - 100.0 fL   MCH 29.4 26.0 - 34.0 pg   MCHC 33.1 30.0 - 36.0 g/dL   RDW 96.2 (H) 95.2 - 84.1 %   Platelets 323 150 - 400 K/uL   nRBC 0.0 0.0 - 0.2 %   Neutrophils Relative % 87 %   Neutro Abs 9.3 (H) 1.7 - 7.7 K/uL   Lymphocytes Relative 8 %   Lymphs Abs 0.9 0.7 - 4.0  K/uL   Monocytes Relative 4 %   Monocytes Absolute 0.4 0.1 - 1.0 K/uL   Eosinophils Relative 0 %   Eosinophils Absolute 0.0 0.0 - 0.5 K/uL   Basophils Relative 0 %   Basophils Absolute 0.0 0.0 - 0.1 K/uL   Immature Granulocytes 1 %   Abs Immature Granulocytes 0.06 0.00 - 0.07 K/uL  Comprehensive metabolic panel     Status: Abnormal   Collection Time: 03/10/23  6:41 PM  Result Value Ref Range   Sodium 134 (L) 135 - 145 mmol/L   Potassium 4.5 3.5 - 5.1 mmol/L   Chloride 96 (L) 98 - 111 mmol/L   CO2 27 22 - 32 mmol/L   Glucose, Bld 174 (H) 70 - 99 mg/dL   BUN 40 (H) 8 - 23 mg/dL   Creatinine, Ser 3.24 (H) 0.61 - 1.24 mg/dL   Calcium 9.2 8.9 - 40.1 mg/dL   Total Protein 7.7 6.5 - 8.1 g/dL   Albumin 3.6 3.5 - 5.0 g/dL   AST 24 15 - 41 U/L   ALT 12 0 - 44 U/L   Alkaline Phosphatase 103 38 - 126 U/L   Total Bilirubin 0.8 <1.2 mg/dL   GFR, Estimated 56 (L) >60 mL/min   Anion gap 11 5 - 15  Procalcitonin     Status: None   Collection Time: 03/10/23  6:41 PM  Result Value Ref Range   Procalcitonin <0.10 ng/mL  Troponin I (High Sensitivity)     Status: Abnormal   Collection Time: 03/10/23  6:41 PM  Result Value Ref Range   Troponin I (High Sensitivity) 19 (H) <18 ng/L  Brain natriuretic peptide     Status: Abnormal   Collection Time: 03/10/23  6:41 PM  Result Value Ref Range   B Natriuretic Peptide 167.1 (H) 0.0 - 100.0 pg/mL    CBC: Recent Labs  Lab 03/10/23 1841  WBC 10.7*  NEUTROABS 9.3*  HGB 12.5*  HCT 37.8*  MCV 88.9  PLT 323   Basic Metabolic Panel: Recent Labs  Lab 03/10/23 1841  NA 134*  K 4.5  CL 96*  CO2 27  GLUCOSE 174*  BUN 40*  CREATININE 1.30*  CALCIUM 9.2   GFR: Estimated Creatinine Clearance: 52.6 mL/min (A) (by C-G formula based on SCr of 1.3 mg/dL (H)). Liver Function Tests: Recent Labs  Lab 03/10/23 1841  AST 24  ALT 12  ALKPHOS 103  BILITOT 0.8  PROT 7.7  ALBUMIN 3.6  Urinalysis    Component Value Date/Time   COLORURINE YELLOW  (A) 09/14/2022 1228   APPEARANCEUR CLOUDY (A) 09/14/2022 1228   LABSPEC 1.017 09/14/2022 1228   PHURINE 8.0 09/14/2022 1228   GLUCOSEU NEGATIVE 09/14/2022 1228   HGBUR SMALL (A) 09/14/2022 1228   BILIRUBINUR NEGATIVE 09/14/2022 1228   KETONESUR NEGATIVE 09/14/2022 1228   PROTEINUR 100 (A) 09/14/2022 1228   NITRITE NEGATIVE 09/14/2022 1228   LEUKOCYTESUR LARGE (A) 09/14/2022 1228   Unresulted Labs (From admission, onward)     Start     Ordered   03/10/23 2101  Lactic acid, plasma  (Lactic Acid)  STAT Now then every 3 hours,   STAT      03/10/23 2100   03/10/23 2053  Magnesium  Add-on,   AD        03/10/23 2052           Medications  doxycycline (VIBRAMYCIN) 100 mg in dextrose 5 % 250 mL IVPB (100 mg Intravenous New Bag/Given 03/10/23 2051)  diltiazem (CARDIZEM) 125 mg in dextrose 5% 125 mL (1 mg/mL) infusion (5 mg/hr Intravenous New Bag/Given 03/10/23 2050)  acetic acid 0.25 % irrigation 1 Application (has no administration in time range)  allopurinol (ZYLOPRIM) tablet 100 mg (has no administration in time range)  oxybutynin (DITROPAN) tablet 5 mg (has no administration in time range)  levothyroxine (SYNTHROID) tablet 75 mcg (has no administration in time range)  lactase (LACTAID) tablet 3,000 Units (has no administration in time range)  gabapentin (NEURONTIN) capsule 800 mg (has no administration in time range)  fluticasone furoate-vilanterol (BREO ELLIPTA) 100-25 MCG/ACT 1 puff (has no administration in time range)    And  umeclidinium bromide (INCRUSE ELLIPTA) 62.5 MCG/ACT 1 puff (has no administration in time range)  carbidopa-levodopa (SINEMET IR) 25-100 MG per tablet immediate release 1 tablet (has no administration in time range)  apixaban (ELIQUIS) tablet 5 mg (has no administration in time range)  cefTRIAXone (ROCEPHIN) 2 g in sodium chloride 0.9 % 100 mL IVPB (0 g Intravenous Stopped 03/10/23 2050)    Radiological Exams on Admission: DG Chest Portable 1 View  Result  Date: 03/10/2023 CLINICAL DATA:  Dyspnea EXAM: PORTABLE CHEST 1 VIEW COMPARISON:  09/17/2022 chest radiograph. FINDINGS: Stable cardiomediastinal silhouette with mild cardiomegaly. No pneumothorax. Possible small right pleural effusion. No left pleural effusion. Low lung volumes. No pulmonary edema. Patchy right lung base opacity is new. IMPRESSION: 1. New patchy right lung base opacity, suspicious for pneumonia. Chest radiograph follow-up to resolution recommended. 2. Possible small right pleural effusion. 3. Stable mild cardiomegaly.  No pulmonary edema. Electronically Signed   By: Delbert Phenix M.D.   On: 03/10/2023 19:54     Data Reviewed: Relevant notes from primary care and specialist visits, past discharge summaries as available in EHR, including Care Everywhere. Prior diagnostic testing as pertinent to current admission diagnoses Updated medications and problem lists for reconciliation ED course, including vitals, labs, imaging, treatment and response to treatment Triage notes, nursing and pharmacy notes and ED provider's notes Notable results as noted in HPI    In overview patient is a 77 year old male with past medical history of hypertension, diabetes, COPD and chronic respiratory failure on 2 L A-fib RVR on anticoagulation and degenerative disc disease and arthritis presenting with cough and shortness of breath progressive over the past week or so on found to be in A-fib RVR with  a heart rate of 140s.  Admission requested for  chronic respiratory failure and A-fib RVR: Assessment & Plan Chronic anticoagulation (ELIQUIS) Will continue Eliquis. COPD (chronic obstructive pulmonary disease) (HCC) Prn albuterol.  Cont 2 L baseline.   Paroxysmal atrial fibrillation with RVR (HCC) Currently in A-fib RVR started on diltiazem drip in the emergency room. Pt 2 D echo in 08/2022: 1. Left ventricular ejection fraction, by estimation, is 55 to 60% . The left ventricle has normal function. The  left ventricle has no regional wall motion abnormalities. Left ventricular diastolic parameters are indeterminate. 2. Pulmonary artery pressure is at least mildly to moderately elevated ( RVSP 35- 40% plus central venous/ right atrial pressure) . Right ventricular systolic function is low normal. The right ventricular size is moderately enlarged. 3. Left atrial size was severely dilated. 4. 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 There is at least mild to moderate mitral valve regurgitation, though severity may be underestimated due to jet eccentricity. No evidence of mitral stenosis. 5. Right atrial size was severely dilated. 6. Tricuspid valve regurgitation is moderate. 7. The aortic valve is tricuspid. Aortic valve regurgitation is not visualized. No aortic stenosis is present.  Essential hypertension Vitals:   03/10/23 1834 03/10/23 1900 03/10/23 1930 03/10/23 2030  BP: 138/86 (!) 155/118 105/75 112/86  Cont eliquis  and cardizem.   Parkinson disease (HCC) Cont sinemet IR and gabapentin.  Hypothyroidism Continue levothyroxine at 75 mcg. Prolonged QT interval Patient has history of prolonged QT and today it is 464.  Will obtain magnesium.   AKI (acute kidney injury) [facility]) Lab Results  Component Value Date   CREATININE 1.30 (H) 03/10/2023   CREATININE 0.97 12/09/2022   CREATININE 1.05 12/08/2022  Start pt on Low rate MIVF.    Type 2 diabetes mellitus with diabetic neuropathy, without long-term current use of insulin (HCC) Glycemic protocol . Anemia    Latest Ref Rng & Units 03/10/2023    6:41 PM 12/09/2022    4:54 AM 12/08/2022    5:08 AM  CBC  WBC 4.0 - 10.5 K/uL 10.7  6.4  6.3   Hemoglobin 13.0 - 17.0 g/dL 16.1  09.6  04.5   Hematocrit 39.0 - 52.0 % 37.8  36.1  39.3   Platelets 150 - 400 K/uL 323  261  258   Will follow. Resume po iron once pt is stable  and a.fib rvr and pneumonia is resolved.      DVT prophylaxis:  Eliquis  Consults:   None   Advance Care Planning:    Code Status: Prior   Family Communication:  None.  Disposition Plan:  SNF   Severity of Illness: The appropriate patient status for this patient is INPATIENT. Inpatient status is judged to be reasonable and necessary in order to provide the required intensity of service to ensure the patient's safety. The patient's presenting symptoms, physical exam findings, and initial radiographic and laboratory data in the context of their chronic comorbidities is felt to place them at high risk for further clinical deterioration. Furthermore, it is not anticipated that the patient will be medically stable for discharge from the hospital within 2 midnights of admission.   * I certify that at the point of admission it is my clinical judgment that the patient will require inpatient hospital care spanning beyond 2 midnights from the point of admission due to high intensity of service, high risk for further deterioration and high frequency of surveillance required.*  Author:  Gertha Calkin, MD 03/10/2023 9:43 PM  For on call review [email or web address].

## 2023-03-11 ENCOUNTER — Encounter: Payer: No Typology Code available for payment source | Admitting: Occupational Therapy

## 2023-03-11 DIAGNOSIS — R0602 Shortness of breath: Secondary | ICD-10-CM | POA: Diagnosis not present

## 2023-03-11 LAB — CBC
HCT: 38 % — ABNORMAL LOW (ref 39.0–52.0)
Hemoglobin: 12.4 g/dL — ABNORMAL LOW (ref 13.0–17.0)
MCH: 29 pg (ref 26.0–34.0)
MCHC: 32.6 g/dL (ref 30.0–36.0)
MCV: 89 fL (ref 80.0–100.0)
Platelets: 333 10*3/uL (ref 150–400)
RBC: 4.27 MIL/uL (ref 4.22–5.81)
RDW: 17.9 % — ABNORMAL HIGH (ref 11.5–15.5)
WBC: 10 10*3/uL (ref 4.0–10.5)
nRBC: 0 % (ref 0.0–0.2)

## 2023-03-11 LAB — T4, FREE: Free T4: 1.32 ng/dL — ABNORMAL HIGH (ref 0.61–1.12)

## 2023-03-11 LAB — COMPREHENSIVE METABOLIC PANEL
ALT: 14 U/L (ref 0–44)
AST: 18 U/L (ref 15–41)
Albumin: 3.5 g/dL (ref 3.5–5.0)
Alkaline Phosphatase: 98 U/L (ref 38–126)
Anion gap: 11 (ref 5–15)
BUN: 42 mg/dL — ABNORMAL HIGH (ref 8–23)
CO2: 28 mmol/L (ref 22–32)
Calcium: 9.4 mg/dL (ref 8.9–10.3)
Chloride: 96 mmol/L — ABNORMAL LOW (ref 98–111)
Creatinine, Ser: 1.53 mg/dL — ABNORMAL HIGH (ref 0.61–1.24)
GFR, Estimated: 46 mL/min — ABNORMAL LOW (ref >60)
Glucose, Bld: 124 mg/dL — ABNORMAL HIGH (ref 70–99)
Potassium: 4.1 mmol/L (ref 3.5–5.1)
Sodium: 135 mmol/L (ref 135–145)
Total Bilirubin: 0.8 mg/dL (ref <1.2)
Total Protein: 7.6 g/dL (ref 6.5–8.1)

## 2023-03-11 LAB — EXPECTORATED SPUTUM ASSESSMENT W GRAM STAIN, RFLX TO RESP C

## 2023-03-11 LAB — GLUCOSE, CAPILLARY
Glucose-Capillary: 148 mg/dL — ABNORMAL HIGH (ref 70–99)
Glucose-Capillary: 164 mg/dL — ABNORMAL HIGH (ref 70–99)

## 2023-03-11 LAB — MAGNESIUM: Magnesium: 2.2 mg/dL (ref 1.7–2.4)

## 2023-03-11 LAB — TSH: TSH: 0.474 u[IU]/mL (ref 0.350–4.500)

## 2023-03-11 LAB — CBG MONITORING, ED: Glucose-Capillary: 123 mg/dL — ABNORMAL HIGH (ref 70–99)

## 2023-03-11 LAB — LACTIC ACID, PLASMA: Lactic Acid, Venous: 1.6 mmol/L (ref 0.5–1.9)

## 2023-03-11 MED ORDER — SODIUM CHLORIDE 3 % IN NEBU: 4.0000 mL | INHALATION_SOLUTION | Freq: Two times a day (BID) | RESPIRATORY_TRACT | Status: AC | PRN

## 2023-03-11 MED ORDER — ACETIC ACID 0.25 % IR SOLN
1.0000 | Freq: Two times a day (BID) | Status: DC
  Administered 2023-03-12 – 2023-03-13 (×2): 1
  Filled 2023-03-11 (×2): qty 1000

## 2023-03-11 MED ORDER — ORAL CARE MOUTH RINSE: 15.0000 mL | OROMUCOSAL | Status: DC | PRN

## 2023-03-11 MED ORDER — ARFORMOTEROL TARTRATE 15 MCG/2ML IN NEBU
15.0000 ug | INHALATION_SOLUTION | Freq: Two times a day (BID) | RESPIRATORY_TRACT | Status: DC
  Administered 2023-03-11 – 2023-03-16 (×9): 15 ug via RESPIRATORY_TRACT
  Filled 2023-03-11 (×11): qty 2

## 2023-03-11 MED ORDER — REVEFENACIN 175 MCG/3ML IN SOLN
175.0000 ug | Freq: Every day | RESPIRATORY_TRACT | Status: DC
  Administered 2023-03-11 – 2023-03-13 (×3): 175 ug via RESPIRATORY_TRACT
  Filled 2023-03-11 (×3): qty 3

## 2023-03-11 MED ORDER — BUDESONIDE 0.25 MG/2ML IN SUSP
0.2500 mg | Freq: Two times a day (BID) | RESPIRATORY_TRACT | Status: DC
Start: 2023-03-11 — End: 2023-03-14
  Administered 2023-03-11 – 2023-03-13 (×5): 0.25 mg via RESPIRATORY_TRACT
  Filled 2023-03-11 (×6): qty 2

## 2023-03-11 MED ORDER — ACETYLCYSTEINE 20 % IN SOLN
3.0000 mL | Freq: Two times a day (BID) | RESPIRATORY_TRACT | Status: DC
  Administered 2023-03-11 – 2023-03-13 (×6): 3 mL via RESPIRATORY_TRACT
  Filled 2023-03-11 (×9): qty 4

## 2023-03-11 MED ORDER — SODIUM CHLORIDE 0.9 % IV SOLN
2.0000 g | INTRAVENOUS | Status: DC
  Administered 2023-03-11 – 2023-03-15 (×5): 2 g via INTRAVENOUS
  Filled 2023-03-11 (×7): qty 20

## 2023-03-11 MED ORDER — DOXYCYCLINE HYCLATE 100 MG PO TABS
100.0000 mg | ORAL_TABLET | Freq: Two times a day (BID) | ORAL | Status: DC
  Administered 2023-03-11 – 2023-03-16 (×10): 100 mg via ORAL
  Filled 2023-03-11 (×11): qty 1

## 2023-03-11 MED ORDER — ALBUTEROL SULFATE (2.5 MG/3ML) 0.083% IN NEBU
2.5000 mg | INHALATION_SOLUTION | Freq: Four times a day (QID) | RESPIRATORY_TRACT | Status: DC
  Administered 2023-03-11 (×3): 2.5 mg via RESPIRATORY_TRACT
  Filled 2023-03-11 (×3): qty 3

## 2023-03-11 NOTE — Assessment & Plan Note (Signed)
Improving rate control Transitioning to oral Cardizem today and subsequently discontinuing Cardizem infusion. Continuing anticoagulation

## 2023-03-11 NOTE — Assessment & Plan Note (Signed)
Vitals:   03/10/23 1900 03/10/23 1930 03/10/23 2030 03/10/23 2130  BP: (!) 155/118 105/75 112/86 (!) 116/93   03/10/23 2200 03/10/23 2230 03/10/23 2330 03/11/23 0000  BP: 107/77 112/81 115/75 90/76   03/11/23 0030 03/11/23 0100 03/11/23 0130 03/11/23 0200  BP: (!) 89/44 103/67 107/71 (!) 126/90  Cont eliquis  and cardizem.

## 2023-03-11 NOTE — Assessment & Plan Note (Signed)
Mild acute kidney injury with creatinine peaking at 1.53, up from baseline of 1 Creatinine now downtrending likely with treatment of underlying infection

## 2023-03-11 NOTE — Assessment & Plan Note (Signed)
Patient has history of prolonged QT and today it is 464.  Will obtain magnesium.

## 2023-03-11 NOTE — Assessment & Plan Note (Addendum)
CTA negative for PE. Pt found to have  1. No CT evidence of pulmonary artery embolus. 2. Impaction of the right middle and right lower lobe bronchi with postobstructive atelectasis or pneumonia. Follow-up to resolution recommended. 3. Coronary vascular calcification. Mucomyst nebs. Pulm consult per AM team. 4.  Aortic Atherosclerosis (ICD10-I70.0).

## 2023-03-11 NOTE — Assessment & Plan Note (Signed)
Will continue Eliquis. 

## 2023-03-11 NOTE — ED Notes (Signed)
ED TO INPATIENT HANDOFF REPORT  ED Nurse Name and Phone #: Precilla Purnell [phone]  S Name/Age/Gender Raymond Hays 77 y.o. male Room/Bed: ED15A/ED15A  Code Status   Code Status: Full Code  Home/SNF/Other Nursing Home Patient oriented to: self, place, time, and situation Is this baseline? Yes   Triage Complete: Triage complete  Chief Complaint SOB (shortness of breath) [R06.02]  Triage Note Pt to ED via [facility] from [facility] for c/o shob. Pt diagnosed with pneumonia on 8th and has had increasing shob since. Pt has hx COPD, 2L O2 at baseline, but not compliant.  HR 130-160 Afib  BP 128/87 Temp 98.4 Cbg 200   Allergies No Known Allergies  Level of Care/Admitting Diagnosis ED Disposition     ED Disposition  Admit   Condition  --   Comment  Hospital Area: [facility] [[id number]]  Level of Care: Progressive [102]  Admit to Progressive based on following criteria: CARDIOVASCULAR & THORACIC of moderate stability with acute coronary syndrome symptoms/low risk myocardial infarction/hypertensive urgency/arrhythmias/heart failure potentially compromising stability and stable post cardiovascular intervention patients.  Admit to Progressive based on following criteria: RESPIRATORY PROBLEMS hypoxemic/hypercapnic respiratory failure that is responsive to NIPPV (BiPAP) or High Flow Nasal Cannula (6-80 lpm). Frequent assessment/intervention, no > Q2 hrs < Q4 hrs, to maintain oxygenation and pulmonary hygiene.  Covid Evaluation: Asymptomatic - no recent exposure (last 10 days) testing not required  Diagnosis: SOB (shortness of breath) [[id number]]  Admitting Physician: Darrold Junker  Attending Physician: Darrold Junker  Certification:: I certify this patient will need inpatient services for at least 2 midnights  Expected Medical Readiness: 03/13/2023          B Medical/Surgery History Past Medical History:  Diagnosis Date   Allergic rhinitis    Anxiety     Arthritis    Atrial fibrillation (HCC)    Chronic indwelling Foley catheter    COPD (chronic obstructive pulmonary disease) (HCC)    Cough    Diabetes (HCC)    Essential (primary) hypertension    Hemorrhoid    Hyperlipidemia    PAF (paroxysmal atrial fibrillation) (HCC)    Primary osteoarthritis, unspecified shoulder    Retention of urine, unspecified    Spinal stenosis    UTI (urinary tract infection)    Past Surgical History:  Procedure Laterality Date   APPENDECTOMY     BACK SURGERY     COLONOSCOPY N/A 10/27/2020   Procedure: COLONOSCOPY;  Surgeon: Wyline Mood, MD;  Location: [facility] ENDOSCOPY;  Service: Gastroenterology;  Laterality: N/A;   ELBOW SURGERY     ESOPHAGOGASTRODUODENOSCOPY (EGD) WITH PROPOFOL N/A 10/27/2020   Procedure: ESOPHAGOGASTRODUODENOSCOPY (EGD) WITH PROPOFOL;  Surgeon: Wyline Mood, MD;  Location: [facility] ENDOSCOPY;  Service: Gastroenterology;  Laterality: N/A;   HERNIA REPAIR     KNEE SURGERY     neck     PARTIAL HIP ARTHROPLASTY Bilateral    TEE WITHOUT CARDIOVERSION N/A 10/19/2020   Procedure: TRANSESOPHAGEAL ECHOCARDIOGRAM (TEE);  Surgeon: Antonieta Iba, MD;  Location: [facility] ORS;  Service: Cardiovascular;  Laterality: N/A;   TONSILLECTOMY       A IV Location/Drains/Wounds Patient Lines/Drains/Airways Status     Active Line/Drains/Airways     Name Placement date Placement time Site Days   Peripheral IV 03/10/23 20 G Anterior;Distal;Right;Upper Arm 03/10/23  1843  Arm  1   Suprapubic Catheter 20 Fr. 09/16/22  1205  --  176            Intake/Output  Last 24 hours No intake or output data in the 24 hours ending 03/11/23 1109  Labs/Imaging Results for orders placed or performed during the hospital encounter of 03/10/23 (from the past 48 hour(s))  Lactic acid, plasma     Status: None   Collection Time: 03/10/23  1:27 AM  Result Value Ref Range   Lactic Acid, Venous 1.6 0.5 - 1.9 mmol/L    Comment: Performed at [facility], [address], [place], [place] [address]  Resp panel by RT-PCR (RSV, Flu A&B, Covid) Anterior Nasal Swab     Status: None   Collection Time: 03/10/23  6:41 PM   Specimen: Anterior Nasal Swab  Result Value Ref [place]   SARS Coronavirus 2 by RT PCR NEGATIVE NEGATIVE    Comment: (NOTE) SARS-CoV-2 target nucleic acids are NOT DETECTED.  The SARS-CoV-2 RNA is generally detectable in upper respiratory specimens during the acute phase of infection. The lowest concentration of SARS-CoV-2 viral copies this assay can detect is 138 copies/mL. A negative result does not preclude SARS-Cov-2 infection and should not be used as the sole basis for treatment or other patient management decisions. A negative result may occur with  improper specimen collection/handling, submission of specimen other than nasopharyngeal swab, presence of viral mutation(s) within the areas targeted by this assay, and inadequate number of viral copies(<138 copies/mL). A negative result must be combined with clinical observations, patient history, and epidemiological information. The expected result is Negative.  Fact Sheet for Patients:  BloggerCourse.com  Fact Sheet for Healthcare Providers:  SeriousBroker.it  This test is no t yet approved or cleared by the [place] FDA and  has been authorized for detection and/or diagnosis of SARS-CoV-2 by FDA under an Emergency Use Authorization (EUA). This EUA will remain  in effect (meaning this test can be used) for the duration of the COVID-19 declaration under Section 564(b)(1) of the Act, 21 U.S.C.section 360bbb-3(b)(1), unless the authorization is terminated  or revoked sooner.       Influenza A by PCR NEGATIVE NEGATIVE   Influenza B by PCR NEGATIVE NEGATIVE    Comment: (NOTE) The Xpert Xpress SARS-CoV-2/FLU/RSV plus assay is intended as an aid in the diagnosis of influenza from Nasopharyngeal swab specimens and should not be  used as a sole basis for treatment. Nasal washings and aspirates are unacceptable for Xpert Xpress SARS-CoV-2/FLU/RSV testing.  Fact Sheet for Patients: BloggerCourse.com  Fact Sheet for Healthcare Providers: SeriousBroker.it  This test is not yet approved or cleared by the [place] FDA and has been authorized for detection and/or diagnosis of SARS-CoV-2 by FDA under an Emergency Use Authorization (EUA). This EUA will remain in effect (meaning this test can be used) for the duration of the COVID-19 declaration under Section 564(b)(1) of the Act, 21 U.S.C. section 360bbb-3(b)(1), unless the authorization is terminated or revoked.     Resp Syncytial Virus by PCR NEGATIVE NEGATIVE    Comment: (NOTE) Fact Sheet for Patients: BloggerCourse.com  Fact Sheet for Healthcare Providers: SeriousBroker.it  This test is not yet approved or cleared by the [place] FDA and has been authorized for detection and/or diagnosis of SARS-CoV-2 by FDA under an Emergency Use Authorization (EUA). This EUA will remain in effect (meaning this test can be used) for the duration of the COVID-19 declaration under Section 564(b)(1) of the Act, 21 U.S.C. section 360bbb-3(b)(1), unless the authorization is terminated or revoked.  Performed at [facility], [address], [place], [place] [address]   CBC  with Differential     Status: Abnormal   Collection Time: 03/10/23  6:41 PM  Result Value Ref [place]   WBC 10.7 (H) 4.0 - 10.5 K/uL   RBC 4.25 4.22 - 5.81 MIL/uL   Hemoglobin 12.5 (L) 13.0 - 17.0 g/dL   HCT 96.0 (L) 45.4 - 09.8 %   MCV 88.9 80.0 - 100.0 fL   MCH 29.4 26.0 - 34.0 pg   MCHC 33.1 30.0 - 36.0 g/dL   RDW 11.9 (H) 14.7 - 82.9 %   Platelets 323 150 - 400 K/uL   nRBC 0.0 0.0 - 0.2 %   Neutrophils Relative % 87 %   Neutro Abs 9.3 (H) 1.7 - 7.7 K/uL   Lymphocytes Relative 8 %    Lymphs Abs 0.9 0.7 - 4.0 K/uL   Monocytes Relative 4 %   Monocytes Absolute 0.4 0.1 - 1.0 K/uL   Eosinophils Relative 0 %   Eosinophils Absolute 0.0 0.0 - 0.5 K/uL   Basophils Relative 0 %   Basophils Absolute 0.0 0.0 - 0.1 K/uL   Immature Granulocytes 1 %   Abs Immature Granulocytes 0.06 0.00 - 0.07 K/uL    Comment: Performed at [facility], [address] [place], [place] [address]  Comprehensive metabolic panel     Status: Abnormal   Collection Time: 03/10/23  6:41 PM  Result Value Ref [place]   Sodium 134 (L) 135 - 145 mmol/L   Potassium 4.5 3.5 - 5.1 mmol/L   Chloride 96 (L) 98 - 111 mmol/L   CO2 27 22 - 32 mmol/L   Glucose, Bld 174 (H) 70 - 99 mg/dL    Comment: Glucose reference range applies only to samples taken after fasting for at least 8 hours.   BUN 40 (H) 8 - 23 mg/dL   Creatinine, Ser 0.86 (H) 0.61 - 1.24 mg/dL   Calcium 9.2 8.9 - 57.8 mg/dL   Total Protein 7.7 6.5 - 8.1 g/dL   Albumin 3.6 3.5 - 5.0 g/dL   AST 24 15 - 41 U/L   ALT 12 0 - 44 U/L   Alkaline Phosphatase 103 38 - 126 U/L   Total Bilirubin 0.8 <1.2 mg/dL   GFR, Estimated 56 (L) >60 mL/min    Comment: (NOTE) Calculated using the CKD-EPI Creatinine Equation (2021)    Anion gap 11 5 - 15    Comment: Performed at [facility], [address] [place], [place] [address]  Procalcitonin     Status: None   Collection Time: 03/10/23  6:41 PM  Result Value Ref Range   Procalcitonin <0.10 ng/mL    Comment:        Interpretation: PCT (Procalcitonin) <= 0.5 ng/mL: Systemic infection (sepsis) is not likely. Local bacterial infection is possible. (NOTE)       Sepsis PCT Algorithm           Lower Respiratory Tract                                      Infection PCT Algorithm    ----------------------------     ----------------------------         PCT < 0.25 ng/mL                PCT < 0.10 ng/mL          Strongly encourage  Strongly discourage   discontinuation of antibiotics     initiation of antibiotics    ----------------------------     -----------------------------       PCT 0.25 - 0.50 ng/mL            PCT 0.10 - 0.25 ng/mL               OR       >80% decrease in PCT            Discourage initiation of                                            antibiotics      Encourage discontinuation           of antibiotics    ----------------------------     -----------------------------         PCT >= 0.50 ng/mL              PCT 0.26 - 0.50 ng/mL               AND        <80% decrease in PCT             Encourage initiation of                                             antibiotics       Encourage continuation           of antibiotics    ----------------------------     -----------------------------        PCT >= 0.50 ng/mL                  PCT > 0.50 ng/mL               AND         increase in PCT                  Strongly encourage                                      initiation of antibiotics    Strongly encourage escalation           of antibiotics                                     -----------------------------                                           PCT <= 0.25 ng/mL                                                 OR                                        >  80% decrease in PCT                                      Discontinue / Do not initiate                                             antibiotics  Performed at [facility], [address] [place], [place] [address]   Troponin I (High Sensitivity)     Status: Abnormal   Collection Time: 03/10/23  6:41 PM  Result Value Ref Range   Troponin I (High Sensitivity) 19 (H) <18 ng/L    Comment: (NOTE) Elevated high sensitivity troponin I (hsTnI) values and significant  changes across serial measurements may suggest ACS but many other  chronic and acute conditions are known to elevate hsTnI results.  Refer to the Links section for chest pain algorithms and additional  guidance. Performed at  [facility], [address] [place], [place] [address]   Brain natriuretic peptide     Status: Abnormal   Collection Time: 03/10/23  6:41 PM  Result Value Ref Range   B Natriuretic Peptide 167.1 (H) 0.0 - 100.0 pg/mL    Comment: Performed at [facility], [address] [place], [place] [address]  Troponin I (High Sensitivity)     Status: None   Collection Time: 03/10/23  9:37 PM  Result Value Ref Range   Troponin I (High Sensitivity) 16 <18 ng/L    Comment: (NOTE) Elevated high sensitivity troponin I (hsTnI) values and significant  changes across serial measurements may suggest ACS but many other  chronic and acute conditions are known to elevate hsTnI results.  Refer to the "Links" section for chest pain algorithms and additional  guidance. Performed at [facility], [address] [place], [place] [address]   Magnesium     Status: None   Collection Time: 03/10/23  9:37 PM  Result Value Ref Range   Magnesium 2.0 1.7 - 2.4 mg/dL    Comment: Performed at [facility], [address] [place], [place] [address]  Lactic acid, plasma     Status: Abnormal   Collection Time: 03/10/23  9:37 PM  Result Value Ref Range   Lactic Acid, Venous 2.2 (HH) 0.5 - 1.9 mmol/L    Comment: CRITICAL RESULT CALLED TO, READ BACK BY AND VERIFIED WITH PATRICK WARRICK @2213  ON 03/10/23 SKL Performed at [facility], [address] [place], [place] [address]   TSH     Status: None   Collection Time: 03/11/23  1:17 AM  Result Value Ref Range   TSH 0.474 0.350 - 4.500 uIU/mL    Comment: Performed by a 3rd Generation assay with a functional sensitivity of <=0.01 uIU/mL. Performed at [facility], [address] [place], [place] [address]   T4, free     Status: Abnormal   Collection Time: 03/11/23  1:17 AM  Result Value Ref Range   Free T4 1.32 (H) 0.61 - 1.12 ng/dL    Comment: (NOTE) Biotin ingestion may interfere with free T4 tests. If  the results are inconsistent with the TSH level, previous test results, or the clinical presentation, then consider biotin interference. If needed, order repeat testing after stopping biotin. Performed at [facility], [phone]  [address] [place], [place] [address]   CBG monitoring, ED     Status: Abnormal   Collection Time: 03/11/23  6:04 AM  Result Value Ref Range   Glucose-Capillary 123 (H) 70 - 99 mg/dL    Comment: Glucose reference range applies only to samples taken after fasting for at least 8 hours.  Comprehensive metabolic panel     Status: Abnormal   Collection Time: 03/11/23  6:05 AM  Result Value Ref Range   Sodium 135 135 - 145 mmol/L   Potassium 4.1 3.5 - 5.1 mmol/L   Chloride 96 (L) 98 - 111 mmol/L   CO2 28 22 - 32 mmol/L   Glucose, Bld 124 (H) 70 - 99 mg/dL    Comment: Glucose reference range applies only to samples taken after fasting for at least 8 hours.   BUN 42 (H) 8 - 23 mg/dL   Creatinine, Ser 1.02 (H) 0.61 - 1.24 mg/dL   Calcium 9.4 8.9 - 72.5 mg/dL   Total Protein 7.6 6.5 - 8.1 g/dL   Albumin 3.5 3.5 - 5.0 g/dL   AST 18 15 - 41 U/L   ALT 14 0 - 44 U/L   Alkaline Phosphatase 98 38 - 126 U/L   Total Bilirubin 0.8 <1.2 mg/dL   GFR, Estimated 46 (L) >60 mL/min    Comment: (NOTE) Calculated using the CKD-EPI Creatinine Equation (2021)    Anion gap 11 5 - 15    Comment: Performed at [facility], [address] [place], [place] [address]  CBC     Status: Abnormal   Collection Time: 03/11/23  6:05 AM  Result Value Ref Range   WBC 10.0 4.0 - 10.5 K/uL   RBC 4.27 4.22 - 5.81 MIL/uL   Hemoglobin 12.4 (L) 13.0 - 17.0 g/dL   HCT 03.4 (L) 74.2 - 59.5 %   MCV 89.0 80.0 - 100.0 fL   MCH 29.0 26.0 - 34.0 pg   MCHC 32.6 30.0 - 36.0 g/dL   RDW 63.8 (H) 75.6 - 43.3 %   Platelets 333 150 - 400 K/uL   nRBC 0.0 0.0 - 0.2 %    Comment: Performed at [facility], [address] [place], [place] [address]  Magnesium     Status: None    Collection Time: 03/11/23  6:05 AM  Result Value Ref Range   Magnesium 2.2 1.7 - 2.4 mg/dL    Comment: Performed at [facility], [address] [place], [place] [address]   CT Angio Chest Pulmonary Embolism (PE) W or WO Contrast  Result Date: 03/10/2023 CLINICAL DATA:  Concern for pulmonary edema. EXAM: CT ANGIOGRAPHY CHEST WITH CONTRAST TECHNIQUE: Multidetector CT imaging of the chest was performed using the standard protocol during bolus administration of intravenous contrast. Multiplanar CT image reconstructions and MIPs were obtained to evaluate the vascular anatomy. RADIATION DOSE REDUCTION: This exam was performed according to the departmental dose-optimization program which includes automated exposure control, adjustment of the mA and/or kV according to patient size and/or use of iterative reconstruction technique. CONTRAST:  OMNIPAQUE IOHEXOL 350 MG/ML SOLN COMPARISON:  Chest CT dated 01/09/2021. Radiograph dated 03/10/2023. FINDINGS: Cardiovascular: There is no cardiomegaly or pericardial effusion. Three-vessel coronary vascular calcification. There is retrograde flow of contrast from the right atrium into the IVC suggestive of right heart dysfunction. Mild atherosclerotic calcification of the thoracic aorta. No aneurysmal dilatation. No pulmonary artery embolus identified. Mediastinum/Nodes: No hilar or mediastinal adenopathy. The esophagus is grossly unremarkable. No  mediastinal fluid collection. Lungs/Pleura: There is impaction of the right middle and right lower lobe bronchi with postobstructive atelectasis or pneumonia. This likely represents mucous impaction, however an endobronchial lesion is not excluded. Follow-up to resolution recommended. The left lung is clear. No pleural effusion or pneumothorax. Upper Abdomen: No acute abnormality. Musculoskeletal: Osteopenia with multilevel degenerative changes. No acute osseous pathology. Review of the MIP images confirms the above  findings. IMPRESSION: 1. No CT evidence of pulmonary artery embolus. 2. Impaction of the right middle and right lower lobe bronchi with postobstructive atelectasis or pneumonia. Follow-up to resolution recommended. 3. Coronary vascular calcification. 4.  Aortic Atherosclerosis (ICD10-I70.0). Electronically Signed   By: Elgie Collard M.D.   On: 03/10/2023 22:42   DG Chest Portable 1 View  Result Date: 03/10/2023 CLINICAL DATA:  Dyspnea EXAM: PORTABLE CHEST 1 VIEW COMPARISON:  09/17/2022 chest radiograph. FINDINGS: Stable cardiomediastinal silhouette with mild cardiomegaly. No pneumothorax. Possible small right pleural effusion. No left pleural effusion. Low lung volumes. No pulmonary edema. Patchy right lung base opacity is new. IMPRESSION: 1. New patchy right lung base opacity, suspicious for pneumonia. Chest radiograph follow-up to resolution recommended. 2. Possible small right pleural effusion. 3. Stable mild cardiomegaly.  No pulmonary edema. Electronically Signed   By: Delbert Phenix M.D.   On: 03/10/2023 19:54    Pending Labs Unresulted Labs (From admission, onward)    None       Vitals/Pain Today's Vitals   03/11/23 0730 03/11/23 0830 03/11/23 0900 03/11/23 1030  BP: 119/69 108/72 (!) 122/109 121/84  Pulse: 69 (!) 116 (!) 107 97  Resp: 16 13 13 16   Temp:      TempSrc:      SpO2: 100% 99% 100% 92%  Weight:      Height:      PainSc:        Isolation Precautions No active isolations  Medications Medications  diltiazem (CARDIZEM) 125 mg in dextrose 5% 125 mL (1 mg/mL) infusion (6 mg/hr Intravenous Rate/Dose Change 03/11/23 0606)  acetic acid 0.25 % irrigation 1 Application (has no administration in time range)  allopurinol (ZYLOPRIM) tablet 100 mg (100 mg Oral Given 03/11/23 1041)  oxybutynin (DITROPAN) tablet 5 mg (5 mg Oral Given 03/11/23 1016)  levothyroxine (SYNTHROID) tablet 75 mcg (75 mcg Oral Given 03/11/23 0535)  lactase (LACTAID) tablet 3,000 Units (3,000 Units Oral  Given 03/11/23 1017)  gabapentin (NEURONTIN) capsule 800 mg (800 mg Oral Given 03/11/23 1017)  fluticasone furoate-vilanterol (BREO ELLIPTA) 100-25 MCG/ACT 1 puff (has no administration in time range)    And  umeclidinium bromide (INCRUSE ELLIPTA) 62.5 MCG/ACT 1 puff (has no administration in time range)  carbidopa-levodopa (SINEMET IR) 25-100 MG per tablet immediate release 1 tablet (1 tablet Oral Given 03/11/23 1017)  apixaban (ELIQUIS) tablet 5 mg (5 mg Oral Given 03/11/23 1016)  insulin aspart (novoLOG) injection 0-15 Units (has no administration in time range)  acetaminophen (TYLENOL) tablet 650 mg (has no administration in time range)    Or  acetaminophen (TYLENOL) suppository 650 mg (has no administration in time range)  cefTRIAXone (ROCEPHIN) 2 g in sodium chloride 0.9 % 100 mL IVPB (has no administration in time range)  acetylcysteine (MUCOMYST) 20 % nebulizer / oral solution 3 mL (3 mLs Nebulization Not Given 03/11/23 0930)  albuterol (PROVENTIL) (2.5 MG/3ML) 0.083% nebulizer solution 2.5 mg (2.5 mg Nebulization Not Given 03/11/23 1018)  doxycycline (VIBRA-TABS) tablet 100 mg (100 mg Oral Given 03/11/23 1016)  cefTRIAXone (ROCEPHIN) 2 g in sodium chloride  0.9 % 100 mL IVPB (0 g Intravenous Stopped 03/10/23 2050)  doxycycline (VIBRAMYCIN) 100 mg in dextrose 5 % 250 mL IVPB (0 mg Intravenous Stopped 03/10/23 2251)  iohexol (OMNIPAQUE) 350 MG/ML injection 100 mL (100 mLs Intravenous Contrast Given 03/10/23 2216)    Mobility manual wheelchair     Focused Assessments Pulmonary Assessment Handoff:  Lung sounds:   O2 Device: Room Air O2 Flow Rate (L/min): 2 L/min    R Recommendations: See Admitting Provider Note  Report given to:   Additional Notes:

## 2023-03-11 NOTE — Progress Notes (Signed)
PROGRESS NOTE Raymond Hays  AOZ:[id number] DOB: [birth year] DOA: 03/10/2023 PCP: Pearson Grippe, MD  Brief Narrative/Hospital Course: 77 y.o. male with medical history significant for osteoarthritis, paroxysmal atrial fibrillation, COPD on 2l [place], type diabetes mellitus, hypertension, dyslipidemia and spinal stenosis, brought via EMS for shortness of breath and tachycardia patient was noted to have a heart rate of 140s at his facility and increasing shortness of breath. Initial vitals were reported to be heart rate of 130-1 60 A-fib RVR blood pressure 128/87 afebrile at 98.4 and blood glucose of 200.   In the ED EKG showed A-fib RVR, chest x-ray with right lower lobe pneumonia Labs: mild hyponatremia at 134 chloride 96 glucose 174 BUN 40 creatinine 1.30 EGFR of 56 and normal LFTs.- BNP of 167.1, troponin of 19. Procalcitonin of less than 0.10, CBC shows a white count of 10.7 anemia with a hemoglobin of 12.5  Patient was given Cardizem, antibiotics bronchodilators and admitted for further management Since admission underwent CT angio chest no PE, infection of the right middle and right lower lobe bronchi with postobstructive atelectasis or pneumonia, coronary vascular calcification and aortic atherosclerosis.   Subjective: Patient seen and examined this morning abdomen is bloated but his baseline usually goes every 3 to 4 days with big bowel movement had 1 3 days ago about to have 1 today.  Remains on Cardizem drip at 7.5 mg heart rate in the 1 20-1 20s. Having coughing.   12/10>Overnight heart rate in 60s BP stable on room air. Labs with creatinine slightly up 1.5 stable LFTs and CBC otherwise, free T4, 1.3 TSH 0.47   Assessment and Plan: Principal Problem:   SOB (shortness of breath) Active Problems:   Chronic anticoagulation (ELIQUIS)   COPD (chronic obstructive pulmonary disease) (HCC)   Paroxysmal atrial fibrillation with RVR (HCC)   Essential hypertension   Parkinson disease (HCC)    Hypothyroidism   Prolonged QT interval   AKI (acute kidney injury) (HCC)   Type 2 diabetes mellitus with diabetic neuropathy, without long-term current use of insulin (HCC)   Anemia   PAF with RVR: 08/2022 NGE:XBMW ventricular ejection fraction, by estimation, is 55 to 60%.free T4, 1.3 TSH 0.47.  Continue on Cardizem drip and transition to po soon.  Likely respiratory illness driving the rate.  Continue Eliquis  Community-acquired pneumonia CT neg per but w/ Infection of right middle and right lower lobe bronchi with postobstructive atelectasis or pneumonia.  Pro-Cal reassuring 10 send 0.1 no leukocytosis.  Continue with Mucomyst, antitussive, ceftriaxone ,madd doxycycline. RT consult for chest vest therapy.   switch Breo Ellipta/incruse  to budesonide, Rosalyn Gess and Yupelri nebulizer,  Continue  hypertonic nebs twice daily for airway clearance, flutter valve 2-4 times per day for mucus clearance, incentive spirometry.   AKI: 0.9 baseline creat.  Unfortunately patient received IV contrast CT angio chest for unclear reason, watch closely on IV fluid avoid further nephrotoxic medications NSAIDs.  Mildly elevated troponin 19> 16, from A-fib.  Monitor no chest pain  T2DM with neuropathy: No results for input(s): "GLUCAP", "HGBA1C" in the last 168 hours.   HTN: Stable BP.  Parkinson's disease: Continue meds  Hypothyroidism: Continue home meds  Constipation: Reports he normally goes in 3 days last BM 3 days ago, refusing any kind of stool softener or Dulcolax PR. Abdomen does appear to needed but reports it is chronic passing flatus no nausea vomiting  Obesity Ambulatory dysfunction Does not walk: Patient's Body mass index is 34.24 kg/m. : Will benefit with PCP  follow-up, weight loss  healthy lifestyle and outpatient sleep evaluation.   DVT prophylaxis: Eliquis Code Status:   Code Status: Full Code Family Communication: plan of care discussed with patient at bedside. Patient status  is: Remains hospitalized because of a fib Level of care: Progressive   Dispo: The patient is from: Home            Anticipated disposition: TBD Objective: Vitals last 24 hrs: Vitals:   03/11/23 0530 03/11/23 0600 03/11/23 0630 03/11/23 0730  BP: (!) 139/92 (!) 146/113 (!) 136/90 119/69  Pulse: (!) 104 (!) 101 61 69  Resp: 12 14 18 16   Temp:   98.2 F (36.8 C)   TempSrc:      SpO2: 100% 100% 100% 100%  Weight:      Height:       Weight change:   Physical Examination: General exam: alert awake, older than stated age HEENT:Oral mucosa moist, Ear/Nose WNL grossly Respiratory system: bilaterally mild wheezing diminished at the bases conducted sound present Coughing Cardiovascular system: S1 & S2 +, No JVD. Gastrointestinal system: Abdomen soft,NT,ND, BS+ Nervous System:Alert, awake, moving extremities. Extremities: LE edema neg,distal peripheral pulses palpable.  Skin: No rashes,no icterus. MSK: Normal muscle bulk,tone, power  Medications reviewed:  Scheduled Meds:  acetic acid  1 Application Irrigation Q12H   acetylcysteine  3 mL Nebulization BID   albuterol  2.5 mg Nebulization Q6H   allopurinol  100 mg Oral Daily   apixaban  5 mg Oral BID   carbidopa-levodopa  1 tablet Oral TID AC   doxycycline  100 mg Oral Q12H   fluticasone furoate-vilanterol  1 puff Inhalation Daily   And   umeclidinium bromide  1 puff Inhalation Daily   gabapentin  800 mg Oral QID   lactase  3,000 Units Oral TID WC   levothyroxine  75 mcg Oral QAC breakfast   oxybutynin  5 mg Oral BID   Continuous Infusions:  cefTRIAXone (ROCEPHIN)  IV     diltiazem (CARDIZEM) infusion 6 mg/hr (03/11/23 0606)    Diet Order             Diet heart healthy/carb modified Room service appropriate? Yes; Fluid consistency: Thin  Diet effective now                   No intake or output data in the 24 hours ending 03/11/23 0912 Net IO Since Admission: No IO data has been entered for this period [03/11/23  0912]  Wt Readings from Last 3 Encounters:  03/10/23 100.7 kg  12/07/22 97.8 kg  11/28/22 83.9 kg     Unresulted Labs (From admission, onward)    None     Data Reviewed: I have personally reviewed following labs and imaging studies CBC: Recent Labs  Lab 03/10/23 1841 03/11/23 0605  WBC 10.7* 10.0  NEUTROABS 9.3*  --   HGB 12.5* 12.4*  HCT 37.8* 38.0*  MCV 88.9 89.0  PLT 323 333   Basic Metabolic Panel:  Recent Labs  Lab 03/10/23 1841 03/10/23 2137 03/11/23 0605  NA 134*  --  135  K 4.5  --  4.1  CL 96*  --  96*  CO2 27  --  28  GLUCOSE 174*  --  124*  BUN 40*  --  42*  CREATININE 1.30*  --  1.53*  CALCIUM 9.2  --  9.4  MG  --  2.0 2.2   GFR: Estimated Creatinine Clearance: 44.7 mL/min (A) (by C-G formula  based on SCr of 1.53 mg/dL (H)). Liver Function Tests:  Recent Labs  Lab 03/10/23 1841 03/11/23 0605  AST 24 18  ALT 12 14  ALKPHOS 103 98  BILITOT 0.8 0.8  PROT 7.7 7.6  ALBUMIN 3.6 3.5   Recent Labs    03/11/23 0117  TSH 0.474  FREET4 1.32*   Sepsis Labs: Recent Labs  Lab 03/10/23 0127 03/10/23 1841 03/10/23 2137  PROCALCITON  --  <0.10  --   LATICACIDVEN 1.6  --  2.2*   Recent Results (from the past 240 hour(s))  Resp panel by RT-PCR (RSV, Flu A&B, Covid) Anterior Nasal Swab     Status: None   Collection Time: 03/10/23  6:41 PM   Specimen: Anterior Nasal Swab  Result Value Ref Range Status   SARS Coronavirus 2 by RT PCR NEGATIVE NEGATIVE Final    Comment: (NOTE) SARS-CoV-2 target nucleic acids are NOT DETECTED.  The SARS-CoV-2 RNA is generally detectable in upper respiratory specimens during the acute phase of infection. The lowest concentration of SARS-CoV-2 viral copies this assay can detect is 138 copies/mL. A negative result does not preclude SARS-Cov-2 infection and should not be used as the sole basis for treatment or other patient management decisions. A negative result may occur with  improper specimen collection/handling,  submission of specimen other than nasopharyngeal swab, presence of viral mutation(s) within the areas targeted by this assay, and inadequate number of viral copies(<138 copies/mL). A negative result must be combined with clinical observations, patient history, and epidemiological information. The expected result is Negative.  Fact Sheet for Patients:  BloggerCourse.com  Fact Sheet for Healthcare Providers:  SeriousBroker.it  This test is no t yet approved or cleared by the [place] FDA and  has been authorized for detection and/or diagnosis of SARS-CoV-2 by FDA under an Emergency Use Authorization (EUA). This EUA will remain  in effect (meaning this test can be used) for the duration of the COVID-19 declaration under Section 564(b)(1) of the Act, 21 U.S.C.section 360bbb-3(b)(1), unless the authorization is terminated  or revoked sooner.       Influenza A by PCR NEGATIVE NEGATIVE Final   Influenza B by PCR NEGATIVE NEGATIVE Final    Comment: (NOTE) The Xpert Xpress SARS-CoV-2/FLU/RSV plus assay is intended as an aid in the diagnosis of influenza from Nasopharyngeal swab specimens and should not be used as a sole basis for treatment. Nasal washings and aspirates are unacceptable for Xpert Xpress SARS-CoV-2/FLU/RSV testing.  Fact Sheet for Patients: BloggerCourse.com  Fact Sheet for Healthcare Providers: SeriousBroker.it  This test is not yet approved or cleared by the [place] FDA and has been authorized for detection and/or diagnosis of SARS-CoV-2 by FDA under an Emergency Use Authorization (EUA). This EUA will remain in effect (meaning this test can be used) for the duration of the COVID-19 declaration under Section 564(b)(1) of the Act, 21 U.S.C. section 360bbb-3(b)(1), unless the authorization is terminated or revoked.     Resp Syncytial Virus by PCR NEGATIVE  NEGATIVE Final    Comment: (NOTE) Fact Sheet for Patients: BloggerCourse.com  Fact Sheet for Healthcare Providers: SeriousBroker.it  This test is not yet approved or cleared by the [place] FDA and has been authorized for detection and/or diagnosis of SARS-CoV-2 by FDA under an Emergency Use Authorization (EUA). This EUA will remain in effect (meaning this test can be used) for the duration of the COVID-19 declaration under Section 564(b)(1) of the Act, 21 U.S.C. section 360bbb-3(b)(1), unless the authorization is  terminated or revoked.  Performed at [facility], [address] [place], [place] [address]     Antimicrobials/Microbiology: Anti-infectives (From admission, onward)    Start     Dose/Rate Route Frequency Ordered Stop   03/11/23 2000  cefTRIAXone (ROCEPHIN) 2 g in sodium chloride 0.9 % 100 mL IVPB        2 g 200 mL/hr over 30 Minutes Intravenous Every 24 hours 03/11/23 0303     03/11/23 1000  doxycycline (VIBRA-TABS) tablet 100 mg        100 mg Oral Every 12 hours 03/11/23 0710     03/10/23 2015  cefTRIAXone (ROCEPHIN) 2 g in sodium chloride 0.9 % 100 mL IVPB        2 g 200 mL/hr over 30 Minutes Intravenous  Once 03/10/23 2009 03/10/23 2050   03/10/23 2015  doxycycline (VIBRAMYCIN) 100 mg in dextrose 5 % 250 mL IVPB        100 mg 125 mL/hr over 120 Minutes Intravenous  Once 03/10/23 2009 03/10/23 2251         Component Value Date/Time   SDES SPUTUM 09/15/2022 1045   SDES  09/15/2022 1045    SPUTUM Performed at [facility], [address] [place], [place] [address]    SPECREQUEST NONE 09/15/2022 1045   SPECREQUEST  09/15/2022 1045    NONE Reflexed from [id number] Performed at [facility], [address] [place] [place], [place] [address]    CULT RARE CANDIDA DUBLINIENSIS 09/15/2022 1045   REPTSTATUS 09/15/2022 FINAL 09/15/2022 1045   REPTSTATUS 09/18/2022 FINAL 09/15/2022 1045      Radiology Studies: CT Angio Chest Pulmonary Embolism (PE) W or WO Contrast  Result Date: 03/10/2023 CLINICAL DATA:  Concern for pulmonary edema. EXAM: CT ANGIOGRAPHY CHEST WITH CONTRAST TECHNIQUE: Multidetector CT imaging of the chest was performed using the standard protocol during bolus administration of intravenous contrast. Multiplanar CT image reconstructions and MIPs were obtained to evaluate the vascular anatomy. RADIATION DOSE REDUCTION: This exam was performed according to the departmental dose-optimization program which includes automated exposure control, adjustment of the mA and/or kV according to patient size and/or use of iterative reconstruction technique. CONTRAST:  OMNIPAQUE IOHEXOL 350 MG/ML SOLN COMPARISON:  Chest CT dated 01/09/2021. Radiograph dated 03/10/2023. FINDINGS: Cardiovascular: There is no cardiomegaly or pericardial effusion. Three-vessel coronary vascular calcification. There is retrograde flow of contrast from the right atrium into the IVC suggestive of right heart dysfunction. Mild atherosclerotic calcification of the thoracic aorta. No aneurysmal dilatation. No pulmonary artery embolus identified. Mediastinum/Nodes: No hilar or mediastinal adenopathy. The esophagus is grossly unremarkable. No mediastinal fluid collection. Lungs/Pleura: There is impaction of the right middle and right lower lobe bronchi with postobstructive atelectasis or pneumonia. This likely represents mucous impaction, however an endobronchial lesion is not excluded. Follow-up to resolution recommended. The left lung is clear. No pleural effusion or pneumothorax. Upper Abdomen: No acute abnormality. Musculoskeletal: Osteopenia with multilevel degenerative changes. No acute osseous pathology. Review of the MIP images confirms the above findings. IMPRESSION: 1. No CT evidence of pulmonary artery embolus. 2. Impaction of the right middle and right lower lobe bronchi with postobstructive atelectasis or  pneumonia. Follow-up to resolution recommended. 3. Coronary vascular calcification. 4.  Aortic Atherosclerosis (ICD10-I70.0). Electronically Signed   By: Elgie Collard M.D.   On: 03/10/2023 22:42   DG Chest Portable 1 View  Result Date: 03/10/2023 CLINICAL DATA:  Dyspnea EXAM: PORTABLE CHEST 1 VIEW COMPARISON:  09/17/2022 chest radiograph. FINDINGS: Stable cardiomediastinal  silhouette with mild cardiomegaly. No pneumothorax. Possible small right pleural effusion. No left pleural effusion. Low lung volumes. No pulmonary edema. Patchy right lung base opacity is new. IMPRESSION: 1. New patchy right lung base opacity, suspicious for pneumonia. Chest radiograph follow-up to resolution recommended. 2. Possible small right pleural effusion. 3. Stable mild cardiomegaly.  No pulmonary edema. Electronically Signed   By: Delbert Phenix M.D.   On: 03/10/2023 19:54     LOS: 1 day   Total time spent in review of labs and imaging, patient evaluation, formulation of plan, documentation and communication with family: 50 minutes  Lanae Boast, MD [facility]  03/11/2023, 9:12 AM

## 2023-03-12 ENCOUNTER — Encounter: Payer: Self-pay | Admitting: Internal Medicine

## 2023-03-12 DIAGNOSIS — N179 Acute kidney failure, unspecified: Secondary | ICD-10-CM

## 2023-03-12 DIAGNOSIS — R262 Difficulty in walking, not elsewhere classified: Secondary | ICD-10-CM

## 2023-03-12 DIAGNOSIS — E039 Hypothyroidism, unspecified: Secondary | ICD-10-CM

## 2023-03-12 DIAGNOSIS — J441 Chronic obstructive pulmonary disease with (acute) exacerbation: Secondary | ICD-10-CM | POA: Diagnosis not present

## 2023-03-12 DIAGNOSIS — E1140 Type 2 diabetes mellitus with diabetic neuropathy, unspecified: Secondary | ICD-10-CM

## 2023-03-12 DIAGNOSIS — I480 Paroxysmal atrial fibrillation: Secondary | ICD-10-CM | POA: Diagnosis not present

## 2023-03-12 DIAGNOSIS — J189 Pneumonia, unspecified organism: Secondary | ICD-10-CM | POA: Diagnosis not present

## 2023-03-12 DIAGNOSIS — I10 Essential (primary) hypertension: Secondary | ICD-10-CM

## 2023-03-12 DIAGNOSIS — L8990 Pressure ulcer of unspecified site, unspecified stage: Secondary | ICD-10-CM | POA: Insufficient documentation

## 2023-03-12 DIAGNOSIS — Z7901 Long term (current) use of anticoagulants: Secondary | ICD-10-CM

## 2023-03-12 DIAGNOSIS — R9431 Abnormal electrocardiogram [ECG] [EKG]: Secondary | ICD-10-CM

## 2023-03-12 DIAGNOSIS — G20B1 Parkinson's disease with dyskinesia, without mention of fluctuations: Secondary | ICD-10-CM

## 2023-03-12 LAB — CBC
HCT: 34.4 % — ABNORMAL LOW (ref 39.0–52.0)
Hemoglobin: 11.2 g/dL — ABNORMAL LOW (ref 13.0–17.0)
MCH: 29.4 pg (ref 26.0–34.0)
MCHC: 32.6 g/dL (ref 30.0–36.0)
MCV: 90.3 fL (ref 80.0–100.0)
Platelets: 280 10*3/uL (ref 150–400)
RBC: 3.81 MIL/uL — ABNORMAL LOW (ref 4.22–5.81)
RDW: 17.5 % — ABNORMAL HIGH (ref 11.5–15.5)
WBC: 8 10*3/uL (ref 4.0–10.5)
nRBC: 0 % (ref 0.0–0.2)

## 2023-03-12 LAB — BASIC METABOLIC PANEL
Anion gap: 9 (ref 5–15)
BUN: 37 mg/dL — ABNORMAL HIGH (ref 8–23)
CO2: 27 mmol/L (ref 22–32)
Calcium: 8.6 mg/dL — ABNORMAL LOW (ref 8.9–10.3)
Chloride: 98 mmol/L (ref 98–111)
Creatinine, Ser: 1.24 mg/dL (ref 0.61–1.24)
GFR, Estimated: 59 mL/min — ABNORMAL LOW (ref >60)
Glucose, Bld: 115 mg/dL — ABNORMAL HIGH (ref 70–99)
Potassium: 3.6 mmol/L (ref 3.5–5.1)
Sodium: 134 mmol/L — ABNORMAL LOW (ref 135–145)

## 2023-03-12 LAB — GLUCOSE, CAPILLARY
Glucose-Capillary: 99 mg/dL (ref 70–99)
Glucose-Capillary: 171 mg/dL — ABNORMAL HIGH (ref 70–99)
Glucose-Capillary: 166 mg/dL — ABNORMAL HIGH (ref 70–99)
Glucose-Capillary: 150 mg/dL — ABNORMAL HIGH (ref 70–99)

## 2023-03-12 LAB — MAGNESIUM: Magnesium: 2.4 mg/dL (ref 1.7–2.4)

## 2023-03-12 MED ORDER — ALBUTEROL SULFATE (2.5 MG/3ML) 0.083% IN NEBU
2.5000 mg | INHALATION_SOLUTION | Freq: Four times a day (QID) | RESPIRATORY_TRACT | Status: DC | PRN
  Administered 2023-03-12: 2.5 mg via RESPIRATORY_TRACT
  Filled 2023-03-12: qty 3

## 2023-03-12 MED ORDER — DILTIAZEM HCL 30 MG PO TABS
30.0000 mg | ORAL_TABLET | Freq: Four times a day (QID) | ORAL | Status: DC
  Administered 2023-03-12 – 2023-03-13 (×5): 30 mg via ORAL
  Filled 2023-03-12 (×5): qty 1

## 2023-03-12 MED ORDER — DILTIAZEM HCL 30 MG PO TABS: 30.0000 mg | ORAL_TABLET | Freq: Four times a day (QID) | ORAL | Status: DC

## 2023-03-12 MED ORDER — PREDNISONE 50 MG PO TABS
50.0000 mg | ORAL_TABLET | Freq: Every day | ORAL | Status: DC
  Administered 2023-03-13 – 2023-03-16 (×4): 50 mg via ORAL
  Filled 2023-03-12 (×4): qty 1

## 2023-03-12 NOTE — Assessment & Plan Note (Addendum)
According to nursing stage one of the bilateral heels. Offloading pressure to heels using boots

## 2023-03-12 NOTE — Progress Notes (Addendum)
PROGRESS NOTE   Raymond Hays  WUJ:[id number] DOB: 08/31/1945 DOA: 03/10/2023 PCP: Pearson Grippe, MD   Date of Service: the patient was seen and examined on 03/12/2023  Brief Narrative:  77 y.o. male with medical history significant for osteoarthritis, paroxysmal atrial fibrillation, COPD on 2l [place] with exertion, type 2 diabetes mellitus, hypertension, dyslipidemia brought via EMS to [facility] emergency department for shortness of breath and tachycardia patient was noted to have a heart rate of 140s.  Upon evaluation in the emergency department patient was found to be in atrial fibrillation with rapid ventricular response.  Furthermore clinically the patient was felt to be suffering from a COPD exacerbation.  Chest x-ray additionally revealed evidence of right lower lobe pneumonia.  Patient was initiated on Cardizem infusion, bronchodilator therapy and initiated on intravenous antibiotics.  The hospitalist group was then called to assess the patient for admission to the hospital.    CT angiogram of the chest was performed revealing no evidence of pulmonary embolism but revealing impaction of the right middle and lower lobe bronchi with postobstructive atelectasis or pneumonia.  Chest physiotherapy was therefore ordered in addition to usage of a flutter valve.  Over the course of the initial 24 hours patient's heart rate down trended and patient was transition to oral diltiazem therapy.   Assessment & Plan Pneumonia of right lower lobe due to infectious organism Slow clinical improvement Continue intravenous antibiotics with intravenous ceftriaxone and doxycycline Continuing bronchodilator therapy Adding flutter valve, chest physiotherapy due to right lower lobe postobstructive pathology Patient will likely need repeat imaging to confirm improvement COPD with acute exacerbation (HCC) Aggressive bronchodilator therapy Continue home regimen of inhalers  Short course of oral systemic  steroids Treatment of underlying infection as noted above Paroxysmal atrial fibrillation with rapid ventricular response (HCC) Improving rate control Transitioning to oral Cardizem today and subsequently discontinuing Cardizem infusion. Continuing anticoagulation Essential hypertension Patient exhibiting low normal blood pressures with current regimen of Cardizem Parkinson disease (HCC) Cont sinemet IR and gabapentin.  Hypothyroidism Continue levothyroxine at 75 mcg. Prolonged QT interval Patient has history of prolonged QT  Monitoring on telemetry Monitoring electrolytes and replacing as necessary Avoiding QT prolonging agents  AKI (acute kidney injury) (HCC) Mild acute kidney injury with creatinine peaking at 1.53, up from baseline of 1 Creatinine now downtrending likely with treatment of underlying infection Type 2 diabetes mellitus with diabetic neuropathy, without long-term current use of insulin (HCC) Hemoglobin A1c 6.8% Accu-Cheks before every meal and nightly with sliding scale insulin Pressure injury of skin According to nursing stage one of the bilateral heels. Offloading pressure to heels using boots Ambulatory dysfunction  Longstanding inability to ambulate Bedbound This likely increases patient's risk of infection, encouraging patient to sit up is much as possible.   Subjective:  Patient states that his generalized weakness is improving.  Associated cough and shortness of breath are still present but are also improving.  Patient reports that the amount of phlegm that he has been coughing up is reducing.  Patient denies chest pain.  Physical Exam:  Vitals:   03/12/23 1600 03/12/23 1928 03/12/23 2021 03/12/23 2034  BP: 113/82 (!) 88/54  (!) 95/57  Pulse: 90     Resp:  16    Temp: 98 F (36.7 C) 98.1 F (36.7 C)    TempSrc: Oral Oral    SpO2: 100%  100% 99%  Weight:      Height:         Constitutional: Awake alert and oriented  x3, no associated  distress.   Skin: no rashes, no lesions, good skin turgor noted. Eyes: Pupils are equally reactive to light.  No evidence of scleral icterus or conjunctival pallor.  ENMT: Moist mucous membranes noted.  Posterior pharynx clear of any exudate or lesions.   Respiratory: Coarse breath sounds bilaterally with significant rhonchi, particularly on the right side. Cardiovascular: Regular rate and rhythm, no murmurs / rubs / gallops. No extremity edema. 2+ pedal pulses. No carotid bruits.  Abdomen: Abdomen is protuberant but soft and nontender.  No evidence of intra-abdominal masses.  Positive bowel sounds noted in all quadrants.   Musculoskeletal: Extremely poor muscle tone of the bilateral lower extremities.   Data Reviewed:  I have personally reviewed and interpreted labs, imaging.  Significant findings are   CBC: Recent Labs  Lab 03/10/23 1841 03/11/23 0605 03/12/23 0317  WBC 10.7* 10.0 8.0  NEUTROABS 9.3*  --   --   HGB 12.5* 12.4* 11.2*  HCT 37.8* 38.0* 34.4*  MCV 88.9 89.0 90.3  PLT 323 333 280   Basic Metabolic Panel: Recent Labs  Lab 03/10/23 1841 03/10/23 2137 03/11/23 0605 03/12/23 0317  NA 134*  --  135 134*  K 4.5  --  4.1 3.6  CL 96*  --  96* 98  CO2 27  --  28 27  GLUCOSE 174*  --  124* 115*  BUN 40*  --  42* 37*  CREATININE 1.30*  --  1.53* 1.24  CALCIUM 9.2  --  9.4 8.6*  MG  --  2.0 2.2 2.4   GFR: Estimated Creatinine Clearance: 55.1 mL/min (by C-G formula based on SCr of 1.24 mg/dL). Liver Function Tests: Recent Labs  Lab 03/10/23 1841 03/11/23 0605  AST 24 18  ALT 12 14  ALKPHOS 103 98  BILITOT 0.8 0.8  PROT 7.7 7.6  ALBUMIN 3.6 3.5    EKG/Telemetry: Personally reviewed.  Rhythm is atrial fibrillation with heart rate of 90 bpm.  No dynamic ST segment changes appreciated.   Code Status:  Full code.  Code status decision has been confirmed with: patient Family Communication: Case discussed with patient's son via phone conversation.  Details  of this conversation are in a separate note dated today.   Severity of Illness:  The appropriate patient status for this patient is INPATIENT. Inpatient status is judged to be reasonable and necessary in order to provide the required intensity of service to ensure the patient's safety. The patient's presenting symptoms, physical exam findings, and initial radiographic and laboratory data in the context of their chronic comorbidities is felt to place them at high risk for further clinical deterioration. Furthermore, it is not anticipated that the patient will be medically stable for discharge from the hospital within 2 midnights of admission.   * I certify that at the point of admission it is my clinical judgment that the patient will require inpatient hospital care spanning beyond 2 midnights from the point of admission due to high intensity of service, high risk for further deterioration and high frequency of surveillance required.*  Time spent:  58 minutes  Author:  Marinda Elk MD  03/12/2023 10:17 PM

## 2023-03-12 NOTE — Assessment & Plan Note (Signed)
Aggressive bronchodilator therapy Continue home regimen of inhalers  Short course of oral systemic steroids Treatment of underlying infection as noted above

## 2023-03-12 NOTE — Assessment & Plan Note (Signed)
Slow clinical improvement Continue intravenous antibiotics with intravenous ceftriaxone and doxycycline Continuing bronchodilator therapy Adding flutter valve, chest physiotherapy due to right lower lobe postobstructive pathology Patient will likely need repeat imaging to confirm improvement

## 2023-03-12 NOTE — Progress Notes (Signed)
HOSPITAL MEDICINE EVENT NOTE    At the patient's request I gave the patient's son an update regarding Mr. Metoyer's clinical improvement and plan of care.  Informed him that with the patient's clinical improvement the patient may be ready for discharge in the next 24 hours or so.  This statement about projected discharge seem to anger the son Jimmye Maule.) who proceeded to become more and more angry and irate, beginning to angrily state that "malpractice is the #3 cause of death in this [place]."  I kindly explained to him our careful and evidence-based process and evaluating patients and preparing them for discharge.  He became even more angry and threateningly said "I will be seeing you" before hanging up on me.  Have notified the charge nurse who was additionally notified the nursing director.  Security has additionally been notified to be on standby.  Marinda Elk  MD [facility]

## 2023-03-12 NOTE — Assessment & Plan Note (Signed)
Continue levothyroxine at 75 mcg.  ?

## 2023-03-12 NOTE — Assessment & Plan Note (Signed)
Cont sinemet IR and gabapentin.

## 2023-03-12 NOTE — [place] FL2 (Addendum)
[place] MEDICAID FL2 LEVEL OF CARE FORM     IDENTIFICATION  Patient Name: Raymond Hays Birthdate: 08-11-1945 Sex: male Admission Date (Current Location): 03/10/2023  [facility] and [place] Number:  Chiropodist and Address:  [facility], [address], [place], [place] [address]      Provider Number: [id number]  Attending Physician Name and Address:  Marinda Elk, MD  Relative Name and Phone Number:       Current Level of Care: Hospital Recommended Level of Care: Skilled Nursing Facility Prior Approval Number:    Date Approved/Denied:   PASRR Number: [id number]  Discharge Plan: SNF    Current Diagnoses: Patient Active Problem List   Diagnosis Date Noted   Anemia 03/10/2023   SOB (shortness of breath) 03/10/2023   Dehydration 12/07/2022   Paroxysmal atrial fibrillation with RVR (HCC) 12/07/2022   Constipation 12/07/2022   Thoracic spondylosis with cord compression (T10) 09/25/2022   Hx of thoracic laminectomy (T11-12) 09/25/2022   Metabolic acidosis 09/15/2022   Aspiration pneumonia of right lower lobe (HCC) 09/15/2022   Complicated UTI (urinary tract infection) 09/14/2022   Hypothyroidism 09/14/2022   Depression with anxiety 09/14/2022   Obesity (BMI 30-39.9) 09/14/2022   Prolonged QT interval 09/14/2022   Chronic indwelling Foley catheter 09/14/2022   Abnormal MRI, thoracic spine (08/23/2022) 09/03/2022   DDD (degenerative disc disease), cervical 08/05/2022   Neurogenic incontinence 08/05/2022   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) 08/05/2022   Foraminal stenosis of thoracic region 08/05/2022   Thoracic Facet Arthropathy 08/05/2022   Lower extremity numbness (Bilateral) 08/05/2022   Weakness of lower extremity (Bilateral) 08/05/2022   Chronic midline low back pain with left-sided sciatica 08/05/2022   Left foot drop 08/05/2022   Lumbar postlaminectomy syndrome 08/05/2022   Poor memory 08/05/2022   Vitamin  B12 deficiency 07/25/2022   Pharmacologic therapy 07/24/2022   Disorder of skeletal system 07/24/2022   Problems influencing health status 07/24/2022   Elevated sed rate 07/24/2022   Elevated C-reactive protein (CRP) 07/24/2022   Chronic anticoagulation (ELIQUIS) 07/24/2022   Chronic neck and back pain (1ry area of Pain) (Bilateral) (L>R) 07/24/2022   Abnormal MRI, cervical spine (08/23/2022) 07/24/2022   History of cervical spinal surgery 07/24/2022   History of lumbar surgery 07/24/2022   History of thoracic surgery 07/24/2022   Laryngopharyngeal reflux 06/17/2022   Throat clearing 06/17/2022   Acute respiratory failure (HCC) 04/03/2021   E. 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/27/2020   Chronic respiratory failure with hypoxia (HCC) 01/10/2020   Impaired functional mobility, balance, gait, and endurance 09/15/2019   Catheter cystitis (HCC) 10/16/2018   Urinary retention 07/14/2018   COVID-19 virus detected 07/03/2018   Benign prostatic hyperplasia with lower urinary tract symptoms 08/06/2017   Osteoarthritis of right knee 06/19/2017   S/P orthopedic surgery, follow-up exam 10/25/2016   Tendon rupture of wrist, sequela 01/02/2016   Rotator cuff tear arthropathy of right shoulder 09/28/2015  Acute pain of right knee 08/03/2015   Chronic knee pain (Right) 08/03/2015   Calcific tendinitis of left shoulder 07/05/2015   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/05/2015   Primary osteoarthritis of left shoulder 07/05/2015   Gait  difficulty 06/26/2015   Severe mitral regurgitation 06/05/2015   AF (paroxysmal atrial fibrillation) (HCC) 05/31/2015   Chronic pain syndrome 05/31/2015   COPD with acute exacerbation (HCC) 05/31/2015   Essential hypertension 05/31/2015   Atrial fibrillation (HCC) 05/31/2015   COPD (chronic obstructive pulmonary disease) (HCC) 05/31/2015   Chronic wrist pain (Right) 04/24/2015   Arthritis of left wrist 04/24/2015   Rupture of extensor tendon of left hand 04/24/2015    Orientation RESPIRATION BLADDER Height & Weight     Self, Time, Situation, Place  O2 (Nasal Cannula 2 L) Incontinent Weight: 221 lb 14.4 oz (100.7 kg) Height:  5' 7.5" (171.5 cm)  BEHAVIORAL SYMPTOMS/MOOD NEUROLOGICAL BOWEL NUTRITION STATUS   (None)  (Parkinsons) Continent Diet (Heart healthy/carb modified)  AMBULATORY STATUS COMMUNICATION OF NEEDS Skin     Verbally Other (Comment), PU Stage and Appropriate Care (Erythema/redness. Unstageable pressure injury on right toe: No dressing listed.) PU Stage 1 Dressing:  (Both knees: Foam prn. Both heels: No dressing listed.)                     Personal Care Assistance Level of Assistance              Functional Limitations Info  Sight, Hearing, Speech Sight Info: Adequate Hearing Info: Adequate Speech Info: Adequate    SPECIAL CARE FACTORS FREQUENCY   Isolation precautions  Contact: ESBL, MRSA                  Contractures Contractures Info: Not present    Additional Factors Info  Code Status, Allergies Code Status Info: Full code Allergies Info: NKDA           Current Medications (03/12/2023):  This is the current hospital active medication list Current Facility-Administered Medications  Medication Dose Route Frequency Provider Last Rate Last Admin   acetaminophen (TYLENOL) tablet 650 mg  650 mg Oral Q6H PRN Gertha Calkin, MD       Or   acetaminophen (TYLENOL) suppository 650 mg  650 mg Rectal Q6H PRN Gertha Calkin, MD       acetic acid  0.25 % irrigation 1 Application  1 Application Irrigation Q12H Kc, Ramesh, MD       acetylcysteine (MUCOMYST) 20 % nebulizer / oral solution 3 mL  3 mL Nebulization BID Irena Cords V, MD   3 mL at 03/12/23 0759   albuterol (PROVENTIL) (2.5 MG/3ML) 0.083% nebulizer solution 2.5 mg  2.5 mg Nebulization Q6H PRN Shalhoub, Deno Lunger, MD       allopurinol (ZYLOPRIM) tablet 100 mg  100 mg Oral Daily Irena Cords V, MD   100 mg at 03/12/23 0825   apixaban (ELIQUIS) tablet 5 mg  5 mg Oral BID Gertha Calkin, MD   5 mg at 03/12/23 0825   arformoterol (BROVANA) nebulizer solution 15 mcg  15 mcg Nebulization BID Kc, Dayna Barker, MD   15 mcg at 03/12/23 0806   budesonide (PULMICORT) nebulizer solution 0.25 mg  0.25 mg Nebulization BID Kc, Ramesh, MD   0.25 mg at 03/12/23 0807   carbidopa-levodopa (SINEMET IR) 25-100 MG per tablet immediate release 1 tablet  1 tablet Oral TID Ulyses Amor, MD   1 tablet at 03/12/23 1245  cefTRIAXone (ROCEPHIN) 2 g in sodium chloride 0.9 % 100 mL IVPB  2 g Intravenous Q24H Irena Cords V, MD 200 mL/hr at 03/11/23 2140 2 g at 03/11/23 2140   diltiazem (CARDIZEM) 125 mg in dextrose 5% 125 mL (1 mg/mL) infusion  5-15 mg/hr Intravenous Continuous Chesley Noon, MD 5 mL/hr at 03/11/23 2026 5 mg/hr at 03/11/23 2026   diltiazem (CARDIZEM) tablet 30 mg  30 mg Oral Q6H Shalhoub, Deno Lunger, MD   30 mg at 03/12/23 0946   doxycycline (VIBRA-TABS) tablet 100 mg  100 mg Oral Q12H Kc, Dayna Barker, MD   100 mg at 03/12/23 0825   gabapentin (NEURONTIN) capsule 800 mg  800 mg Oral QID Irena Cords V, MD   800 mg at 03/12/23 1246   insulin aspart (novoLOG) injection 0-15 Units  0-15 Units Subcutaneous Q4H PRN Gertha Calkin, MD       lactase (LACTAID) tablet 3,000 Units  3,000 Units Oral TID WC Gertha Calkin, MD   3,000 Units at 03/12/23 1246   levothyroxine (SYNTHROID) tablet 75 mcg  75 mcg Oral QAC breakfast Gertha Calkin, MD   75 mcg at 03/12/23 8841   Oral care mouth rinse  15 mL Mouth Rinse PRN Kc,  Dayna Barker, MD       oxybutynin (DITROPAN) tablet 5 mg  5 mg Oral BID Irena Cords V, MD   5 mg at 03/12/23 0825   revefenacin (YUPELRI) nebulizer solution 175 mcg  175 mcg Nebulization Daily Kc, Dayna Barker, MD   175 mcg at 03/12/23 0759   sodium chloride HYPERTONIC 3 % nebulizer solution 4 mL  4 mL Nebulization BID PRN Lanae Boast, MD         Discharge Medications: Please see discharge summary for a list of discharge medications.  Relevant Imaging Results:  Relevant Lab Results:   Additional Information SS#: [id number]  Margarito Liner, LCSW

## 2023-03-12 NOTE — Progress Notes (Addendum)
1900 labs not completed. Writer was not informed that CBC and BMP were not drawn prior to start of shift. Informed by laboratory this was not completed at 2145. Labs immediately drawn and sent.

## 2023-03-12 NOTE — TOC Initial Note (Signed)
Transition of Care [facility]) - Initial/Assessment Note    Patient Details  Name: Raymond Hays MRN: [id number] Date of Birth: 12-13-1945  [facility]) CM/SW Contact:    Margarito Liner, LCSW Phone Number: 03/12/2023, 2:20 PM  Clinical Narrative:  CSW met with patient. No supports at bedside. CSW introduced role and explained that discharge planning would be discussed. Patient confirmed he is a long-term resident at [facility] and plan is to return there at discharge. Per admissions coordinator, he is LTC under [facility]. No further concerns. CSW encouraged patient to contact CSW as needed. CSW will continue to follow patient for support and facilitate return to SNF once medically stable.                  Expected Discharge Plan: Skilled Nursing Facility Barriers to Discharge: Continued Medical Work up   Patient Goals and CMS Choice            Expected Discharge Plan and Services     Post Acute Care Choice: Resumption of Svcs/PTA Provider Living arrangements for the past 2 months: Skilled Nursing Facility                                      Prior Living Arrangements/Services Living arrangements for the past 2 months: Skilled Nursing Facility Lives with:: Facility Resident Patient language and need for interpreter reviewed:: Yes Do you feel safe going back to the place where you live?: Yes      Need for Family Participation in Patient Care: Yes (Comment) Care giver support system in place?: Yes (comment)   Criminal Activity/Legal Involvement Pertinent to Current Situation/Hospitalization: No - Comment as needed  Activities of Daily Living   ADL Screening (condition at time of admission) Independently performs ADLs?: No Does the patient have a NEW difficulty with bathing/dressing/toileting/self-feeding that is expected to last >3 days?: Yes (Initiates electronic notice to provider for possible OT consult) Does the patient have a NEW difficulty  with getting in/out of bed, walking, or climbing stairs that is expected to last >3 days?: Yes (Initiates electronic notice to provider for possible PT consult) Does the patient have a NEW difficulty with communication that is expected to last >3 days?: No Is the patient deaf or have difficulty hearing?: No Does the patient have difficulty seeing, even when wearing glasses/contacts?: No Does the patient have difficulty concentrating, remembering, or making decisions?: No  Permission Sought/Granted Permission sought to share information with : Facility Industrial/product designer granted to share information with : Yes, Verbal Permission Granted     Permission granted to share info w AGENCY: [facility] SN[facility]        Emotional Assessment Appearance:: Appears stated age Attitude/Demeanor/Rapport: Engaged, Gracious Affect (typically observed): Accepting, Appropriate, Calm, Pleasant Orientation: : Oriented to Self, Oriented to Place, Oriented to  Time, Oriented to Situation Alcohol / Substance Use: Not Applicable Psych Involvement: No (comment)  Admission diagnosis:  SOB (shortness of breath) [R06.02] Atrial fibrillation with RVR (HCC) [I48.91] Pneumonia of right lower lobe due to infectious organism [J18.9] Patient Active Problem List   Diagnosis Date Noted   Anemia 03/10/2023   SOB (shortness of breath) 03/10/2023   Dehydration 12/07/2022   Paroxysmal atrial fibrillation with RVR (HCC) 12/07/2022   Constipation 12/07/2022   Thoracic spondylosis with cord compression (T10) 09/25/2022   Hx of thoracic laminectomy (T11-12) 09/25/2022  Metabolic acidosis 09/15/2022   Aspiration pneumonia of right lower lobe (HCC) 09/15/2022   Complicated UTI (urinary tract infection) 09/14/2022   Hypothyroidism 09/14/2022   Depression with anxiety 09/14/2022   Obesity (BMI 30-39.9) 09/14/2022   Prolonged QT interval 09/14/2022   Chronic indwelling Foley catheter 09/14/2022   Abnormal MRI,  thoracic spine (08/23/2022) 09/03/2022   DDD (degenerative disc disease), cervical 08/05/2022   Neurogenic incontinence 08/05/2022   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) 08/05/2022   Foraminal stenosis of thoracic region 08/05/2022   Thoracic Facet Arthropathy 08/05/2022   Lower extremity numbness (Bilateral) 08/05/2022   Weakness of lower extremity (Bilateral) 08/05/2022   Chronic midline low back pain with left-sided sciatica 08/05/2022   Left foot drop 08/05/2022   Lumbar postlaminectomy syndrome 08/05/2022   Poor memory 08/05/2022   Vitamin B12 deficiency 07/25/2022   Pharmacologic therapy 07/24/2022   Disorder of skeletal system 07/24/2022   Problems influencing health status 07/24/2022   Elevated sed rate 07/24/2022   Elevated C-reactive protein (CRP) 07/24/2022   Chronic anticoagulation (ELIQUIS) 07/24/2022   Chronic neck and back pain (1ry area of Pain) (Bilateral) (L>R) 07/24/2022   Abnormal MRI, cervical spine (08/23/2022) 07/24/2022   History of cervical spinal surgery 07/24/2022   History of lumbar surgery 07/24/2022   History of thoracic surgery 07/24/2022   Laryngopharyngeal reflux 06/17/2022   Throat clearing 06/17/2022   Acute respiratory failure (HCC) 04/03/2021   E. 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/27/2020   Chronic respiratory failure with hypoxia (HCC) 01/10/2020   Impaired functional mobility, balance, gait, and endurance 09/15/2019   Catheter cystitis (HCC) 10/16/2018   Urinary retention  07/14/2018   COVID-19 virus detected 07/03/2018   Benign prostatic hyperplasia with lower urinary tract symptoms 08/06/2017   Osteoarthritis of right knee 06/19/2017   S/P orthopedic surgery, follow-up exam 10/25/2016   Tendon rupture of wrist, sequela 01/02/2016   Rotator cuff tear arthropathy of right shoulder 09/28/2015   Acute pain of right knee 08/03/2015   Chronic knee pain (Right) 08/03/2015   Calcific tendinitis of left shoulder 07/05/2015   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/05/2015   Primary osteoarthritis of left shoulder 07/05/2015   Gait difficulty 06/26/2015   Severe mitral regurgitation 06/05/2015   AF (paroxysmal atrial fibrillation) (HCC) 05/31/2015   Chronic pain syndrome 05/31/2015   COPD with acute exacerbation (HCC) 05/31/2015   Essential hypertension 05/31/2015   Atrial fibrillation (HCC) 05/31/2015   COPD (chronic obstructive pulmonary disease) (HCC) 05/31/2015   Chronic wrist pain (Right) 04/24/2015   Arthritis of left wrist 04/24/2015   Rupture of extensor tendon of left hand 04/24/2015   PCP:  Pearson Grippe, MD Pharmacy:   [facility] - [place], [place] - [address] [place] [address] Phone: [phone] Fax: [phone]  [facility] - [place], [place] - [address] [place] [address] Phone: [phone] Fax: [phone]  Social Determinants of Health (SDOH) Social History: SDOH Screenings   Food Insecurity: No Food Insecurity (03/11/2023)  Housing: Low Risk  (03/11/2023)  Transportation Needs: No Transportation Needs (03/11/2023)  Utilities: Not At Risk (03/11/2023)  Depression (PHQ2-9): Low Risk  (09/25/2022)  Social Connections: Unknown (11/30/2021)   Received from [facility], [facility]  Tobacco Use: Medium Risk (03/12/2023)   SDOH Interventions:     Readmission Risk Interventions     03/12/2023    2:19 PM 01/12/2021    1:08 PM 10/20/2020    8:59 AM  Readmission Risk Prevention Plan  Transportation Screening Complete Complete Complete  PCP or Specialist Appt within 3-5 Days Complete  Complete  HRI or Home Care Consult   Complete  Social Work Consult for Recovery Care Planning/Counseling Complete    Palliative Care Screening Not Applicable  Not Applicable  Medication Review Oceanographer) Complete Complete Referral to Pharmacy  PCP or Specialist appointment within 3-5 days of discharge  Complete   HRI or Home Care Consult  Complete   SW Recovery Care/Counseling Consult  Complete   Palliative Care Screening  Not Applicable   Skilled Nursing Facility  Complete

## 2023-03-12 NOTE — Assessment & Plan Note (Signed)
Hemoglobin A1c 6.8% Accu-Cheks before every meal and nightly with sliding scale insulin

## 2023-03-13 ENCOUNTER — Inpatient Hospital Stay: Payer: No Typology Code available for payment source

## 2023-03-13 DIAGNOSIS — J189 Pneumonia, unspecified organism: Secondary | ICD-10-CM | POA: Diagnosis not present

## 2023-03-13 DIAGNOSIS — Z9359 Other cystostomy status: Secondary | ICD-10-CM | POA: Diagnosis not present

## 2023-03-13 DIAGNOSIS — J441 Chronic obstructive pulmonary disease with (acute) exacerbation: Secondary | ICD-10-CM | POA: Diagnosis not present

## 2023-03-13 DIAGNOSIS — L8990 Pressure ulcer of unspecified site, unspecified stage: Secondary | ICD-10-CM

## 2023-03-13 DIAGNOSIS — Z7901 Long term (current) use of anticoagulants: Secondary | ICD-10-CM | POA: Diagnosis not present

## 2023-03-13 LAB — COMPREHENSIVE METABOLIC PANEL
ALT: 11 U/L (ref 0–44)
AST: 16 U/L (ref 15–41)
Albumin: 2.8 g/dL — ABNORMAL LOW (ref 3.5–5.0)
Alkaline Phosphatase: 105 U/L (ref 38–126)
Anion gap: 6 (ref 5–15)
BUN: 29 mg/dL — ABNORMAL HIGH (ref 8–23)
CO2: 29 mmol/L (ref 22–32)
Calcium: 8.4 mg/dL — ABNORMAL LOW (ref 8.9–10.3)
Chloride: 104 mmol/L (ref 98–111)
Creatinine, Ser: 0.91 mg/dL (ref 0.61–1.24)
GFR, Estimated: >60 mL/min (ref >60)
Glucose, Bld: 137 mg/dL — ABNORMAL HIGH (ref 70–99)
Potassium: 3.6 mmol/L (ref 3.5–5.1)
Sodium: 139 mmol/L (ref 135–145)
Total Bilirubin: 0.5 mg/dL (ref <1.2)
Total Protein: 6.3 g/dL — ABNORMAL LOW (ref 6.5–8.1)

## 2023-03-13 LAB — GLUCOSE, CAPILLARY
Glucose-Capillary: 106 mg/dL — ABNORMAL HIGH (ref 70–99)
Glucose-Capillary: 101 mg/dL — ABNORMAL HIGH (ref 70–99)
Glucose-Capillary: 205 mg/dL — ABNORMAL HIGH (ref 70–99)
Glucose-Capillary: 209 mg/dL — ABNORMAL HIGH (ref 70–99)

## 2023-03-13 LAB — CBC WITH DIFFERENTIAL/PLATELET
Abs Immature Granulocytes: 0.05 10*3/uL (ref 0.00–0.07)
Basophils Absolute: 0 10*3/uL (ref 0.0–0.1)
Basophils Relative: 1 %
Eosinophils Absolute: 0.2 10*3/uL (ref 0.0–0.5)
Eosinophils Relative: 3 %
HCT: 33.6 % — ABNORMAL LOW (ref 39.0–52.0)
Hemoglobin: 11.1 g/dL — ABNORMAL LOW (ref 13.0–17.0)
Immature Granulocytes: 1 %
Lymphocytes Relative: 16 %
Lymphs Abs: 1.1 10*3/uL (ref 0.7–4.0)
MCH: 29.4 pg (ref 26.0–34.0)
MCHC: 33 g/dL (ref 30.0–36.0)
MCV: 88.9 fL (ref 80.0–100.0)
Monocytes Absolute: 0.5 10*3/uL (ref 0.1–1.0)
Monocytes Relative: 7 %
Neutro Abs: 5.1 10*3/uL (ref 1.7–7.7)
Neutrophils Relative %: 72 %
Platelets: 284 10*3/uL (ref 150–400)
RBC: 3.78 MIL/uL — ABNORMAL LOW (ref 4.22–5.81)
RDW: 17.3 % — ABNORMAL HIGH (ref 11.5–15.5)
WBC: 7 10*3/uL (ref 4.0–10.5)
nRBC: 0 % (ref 0.0–0.2)

## 2023-03-13 LAB — MAGNESIUM: Magnesium: 2.2 mg/dL (ref 1.7–2.4)

## 2023-03-13 LAB — PROCALCITONIN: Procalcitonin: <0.1 ng/mL

## 2023-03-13 LAB — BRAIN NATRIURETIC PEPTIDE: B Natriuretic Peptide: 151.3 pg/mL — ABNORMAL HIGH (ref 0.0–100.0)

## 2023-03-13 MED ORDER — HYDROCORTISONE (PERIANAL) 2.5 % EX CREA: TOPICAL_CREAM | Freq: Two times a day (BID) | CUTANEOUS | Status: DC

## 2023-03-13 MED ORDER — CITALOPRAM HYDROBROMIDE 20 MG PO TABS
10.0000 mg | ORAL_TABLET | Freq: Every day | ORAL | Status: DC
  Administered 2023-03-13 – 2023-03-15 (×3): 10 mg via ORAL
  Filled 2023-03-13 (×3): qty 1

## 2023-03-13 MED ORDER — SENNA 8.6 MG PO TABS: 1.0000 | ORAL_TABLET | Freq: Two times a day (BID) | ORAL | Status: DC | PRN

## 2023-03-13 MED ORDER — IPRATROPIUM-ALBUTEROL 0.5-2.5 (3) MG/3ML IN SOLN
3.0000 mL | Freq: Four times a day (QID) | RESPIRATORY_TRACT | Status: DC
  Administered 2023-03-13 – 2023-03-16 (×7): 3 mL via RESPIRATORY_TRACT
  Filled 2023-03-13 (×7): qty 3

## 2023-03-13 MED ORDER — POTASSIUM CHLORIDE CRYS ER 20 MEQ PO TBCR
40.0000 meq | EXTENDED_RELEASE_TABLET | Freq: Once | ORAL | Status: AC
  Administered 2023-03-13: 40 meq via ORAL
  Filled 2023-03-13: qty 2

## 2023-03-13 MED ORDER — ATORVASTATIN CALCIUM 20 MG PO TABS
20.0000 mg | ORAL_TABLET | Freq: Every day | ORAL | Status: DC
  Administered 2023-03-13 – 2023-03-15 (×3): 20 mg via ORAL
  Filled 2023-03-13 (×3): qty 1

## 2023-03-13 MED ORDER — DILTIAZEM HCL 30 MG PO TABS
30.0000 mg | ORAL_TABLET | Freq: Once | ORAL | Status: AC
  Administered 2023-03-13: 30 mg via ORAL
  Filled 2023-03-13: qty 1

## 2023-03-13 MED ORDER — NORTRIPTYLINE HCL 10 MG PO CAPS
20.0000 mg | ORAL_CAPSULE | Freq: Every day | ORAL | Status: DC
  Administered 2023-03-13 – 2023-03-15 (×3): 20 mg via ORAL
  Filled 2023-03-13 (×3): qty 2

## 2023-03-13 MED ORDER — VITAMIN C 500 MG PO TABS
500.0000 mg | ORAL_TABLET | Freq: Every day | ORAL | Status: DC
  Administered 2023-03-13 – 2023-03-16 (×4): 500 mg via ORAL
  Filled 2023-03-13 (×4): qty 1

## 2023-03-13 MED ORDER — HYDROCORTISONE (PERIANAL) 2.5 % EX CREA
TOPICAL_CREAM | Freq: Two times a day (BID) | CUTANEOUS | Status: DC | PRN
Start: 2023-03-13 — End: 2023-03-16
  Administered 2023-03-13: 1 via TOPICAL
  Filled 2023-03-13: qty 28.35

## 2023-03-13 MED ORDER — ALBUTEROL SULFATE (2.5 MG/3ML) 0.083% IN NEBU: 2.5000 mg | INHALATION_SOLUTION | Freq: Four times a day (QID) | RESPIRATORY_TRACT | Status: DC

## 2023-03-13 MED ORDER — ALBUTEROL SULFATE (2.5 MG/3ML) 0.083% IN NEBU: 2.5000 mg | INHALATION_SOLUTION | RESPIRATORY_TRACT | Status: DC | PRN

## 2023-03-13 MED ORDER — DILTIAZEM HCL 30 MG PO TABS
60.0000 mg | ORAL_TABLET | Freq: Four times a day (QID) | ORAL | Status: DC
  Administered 2023-03-13 – 2023-03-14 (×3): 60 mg via ORAL
  Filled 2023-03-13 (×3): qty 2

## 2023-03-13 NOTE — Assessment & Plan Note (Signed)
Cont sinemet IR and gabapentin.

## 2023-03-13 NOTE — Assessment & Plan Note (Deleted)
Prn albuterol.  Cont 2 L baseline.

## 2023-03-13 NOTE — Assessment & Plan Note (Signed)
According to nursing stage one of the bilateral heels. Offloading pressure to heels using boots

## 2023-03-13 NOTE — Assessment & Plan Note (Addendum)
Persisting right lower lobe pneumonia on repeat chest x-ray  Obtaining CRP and procalcitonin  Continue intravenous antibiotics with intravenous ceftriaxone and doxycycline Continuing to provide flutter valve, continuing chest physiotherapy due to right lower lobe postobstructive pathology Sputum culture obtained earlier in the hospitalization reveals normal respiratory flora. No blood cultures were obtained prior to initiation of antibiotics. Supplemental oxygen as necessary

## 2023-03-13 NOTE — Plan of Care (Signed)
Patient progressing towards goals.

## 2023-03-13 NOTE — Assessment & Plan Note (Addendum)
Mild acute kidney injury with creatinine peaking at 1.53, up from baseline of 1 Creatinine now downtrending, currently at 0.91 likely due to treatment of underlying infection.

## 2023-03-13 NOTE — Progress Notes (Addendum)
PROGRESS NOTE   Raymond Hays  ZOX:[id number] DOB: 16-Mar-1946 DOA: 03/10/2023 PCP: Pearson Grippe, MD   Date of Service: the patient was seen and examined on 03/13/2023  Brief Narrative:  77 y.o. male with medical history significant for osteoarthritis, paroxysmal atrial fibrillation, COPD on 2l [place] with exertion, type 2 diabetes mellitus, hypertension, dyslipidemia brought via EMS to [facility] emergency department for shortness of breath and tachycardia patient was noted to have a heart rate of 140s.  Upon evaluation in the emergency department patient was found to be in atrial fibrillation with rapid ventricular response.  Furthermore clinically the patient was felt to be suffering from a COPD exacerbation.  Chest x-ray additionally revealed evidence of right lower lobe pneumonia.  Patient was initiated on Cardizem infusion, bronchodilator therapy and initiated on intravenous antibiotics.  The hospitalist group was then called to assess the patient for admission to the hospital.    CT angiogram of the chest was performed revealing no evidence of pulmonary embolism but revealing impaction of the right middle and lower lobe bronchi with postobstructive atelectasis or pneumonia.  Chest physiotherapy was therefore ordered in addition to usage of a flutter valve.  Over the course of the initial 24 hours patient's heart rate down trended and patient was transition to oral diltiazem therapy.   Assessment & Plan Pneumonia of right lower lobe due to infectious organism Persisting right lower lobe pneumonia on repeat chest x-ray  Obtaining CRP and procalcitonin  Continue intravenous antibiotics with intravenous ceftriaxone and doxycycline Continuing to provide flutter valve, continuing chest physiotherapy due to right lower lobe postobstructive pathology Sputum culture obtained earlier in the hospitalization reveals normal respiratory flora. No blood cultures were obtained prior to initiation of  antibiotics. Supplemental oxygen as necessary COPD with acute exacerbation (HCC) Continuing bronchodilator therapy, discontinuing Yupelri and instead placing patient on scheduled DuoNebs Continue home regimen of inhalers  Continuing Short course of oral systemic steroids Treatment of underlying infection as noted above Paroxysmal atrial fibrillation with rapid ventricular response (HCC) Patient back in rapid atrial fibrillation Etiology is likely persisting pneumonia In creasing Cardizem to 60 mg every 6 hours Transitioned off of intravenous Cardizem on 12/11 Continuing anticoagulation Essential hypertension Patient exhibiting low normal blood pressures with current regimen of Cardizem Parkinson disease (HCC) Cont sinemet IR and gabapentin.  Hypothyroidism Continue levothyroxine at 75 mcg. Prolonged QT interval Patient has history of prolonged QT  Monitoring on telemetry Monitoring electrolytes and replacing as necessary Avoiding QT prolonging agents  AKI (acute kidney injury) (HCC) Mild acute kidney injury with creatinine peaking at 1.53, up from baseline of 1 Creatinine now downtrending, currently at 0.91 likely due to treatment of underlying infection. Type 2 diabetes mellitus with diabetic neuropathy, without long-term current use of insulin (HCC) Hemoglobin A1c 6.8% Accu-Cheks before every meal and nightly with sliding scale insulin Pressure injury of skin According to nursing stage one of the bilateral heels. Offloading pressure to heels using boots Ambulatory dysfunction Longstanding inability to ambulate Bedbound This likely increases patient's risk of infection, encouraging patient to sit up is much as possible. Frequent turns per protocol  ankle boots Chronic anticoagulation (ELIQUIS) Will continue Eliquis. Suprapubic catheter (HCC) History of neurogenic bladder with longstanding indwelling suprapubic catheter for at least several years Patient reports catheter  was last exchanged 1 week ago. Intermittent leaking around the insertion site Review of previous presentations to our facility reveals that this has been ongoing for several years with intermittent urology consultations bladder spasms has been suspected to  be the culprit for which the patient is on oxybutynin.    Subjective:  Patient states that his generalized weakness is improving.  Patient continues to complain of severe cough with significant production with tan-colored sputum.  Patient denies associated chest pain.  Physical Exam:  Vitals:   03/12/23 2034 03/12/23 2305 03/13/23 0300 03/13/23 0700  BP: (!) 95/57  123/71 130/83  Pulse:   90   Resp:      Temp:  98.1 F (36.7 C) 97.9 F (36.6 C) 98.1 F (36.7 C)  TempSrc:  Oral Oral Oral  SpO2: 99%  100%   Weight:   100.2 kg   Height:         Constitutional: Awake alert and oriented x3, no associated distress.   Skin: no rashes, no lesions, good skin turgor noted. Eyes: Pupils are equally reactive to light.  No evidence of scleral icterus or conjunctival pallor.  ENMT: Moist mucous membranes noted.  Posterior pharynx clear of any exudate or lesions.   Respiratory: Patient continuing to exhibit coarse breath sounds bilaterally with significant rhonchi, particularly on the right side. Cardiovascular: Irregularly irregular rhythm with rapid ventricular rate, no murmurs / rubs / gallops. No extremity edema. 2+ pedal pulses. No carotid bruits.  Abdomen: Abdomen is protuberant but soft and nontender.  No evidence of intra-abdominal masses.  Positive bowel sounds noted in all quadrants.   Musculoskeletal: Extremely poor muscle tone of the bilateral lower extremities.   Data Reviewed:  I have personally reviewed and interpreted labs, imaging.  Significant findings are   CBC: Recent Labs  Lab 03/10/23 1841 03/11/23 0605 03/12/23 0317 03/13/23 0401  WBC 10.7* 10.0 8.0 7.0  NEUTROABS 9.3*  --   --  5.1  HGB 12.5* 12.4*  11.2* 11.1*  HCT 37.8* 38.0* 34.4* 33.6*  MCV 88.9 89.0 90.3 88.9  PLT 323 333 280 284   Basic Metabolic Panel: Recent Labs  Lab 03/10/23 1841 03/10/23 2137 03/11/23 0605 03/12/23 0317 03/13/23 0401  NA 134*  --  135 134* 139  K 4.5  --  4.1 3.6 3.6  CL 96*  --  96* 98 104  CO2 27  --  28 27 29   GLUCOSE 174*  --  124* 115* 137*  BUN 40*  --  42* 37* 29*  CREATININE 1.30*  --  1.53* 1.24 0.91  CALCIUM 9.2  --  9.4 8.6* 8.4*  MG  --  2.0 2.2 2.4 2.2   GFR: Estimated Creatinine Clearance: 74.9 mL/min (by C-G formula based on SCr of 0.91 mg/dL). Liver Function Tests: Recent Labs  Lab 03/10/23 1841 03/11/23 0605 03/13/23 0401  AST 24 18 16   ALT 12 14 11   ALKPHOS 103 98 105  BILITOT 0.8 0.8 0.5  PROT 7.7 7.6 6.3*  ALBUMIN 3.6 3.5 2.8*    EKG/Telemetry: Personally reviewed.  Rhythm is atrial fibrillation with heart rate of 90 bpm.  No dynamic ST segment changes appreciated.   Code Status:  Full code.  Code status decision has been confirmed with: patient Family Communication: Case discussed with patient's son via phone conversation.  Details of this conversation are in a separate note dated today.   Severity of Illness:  The appropriate patient status for this patient is INPATIENT. Inpatient status is judged to be reasonable and necessary in order to provide the required intensity of service to ensure the patient's safety. The patient's presenting symptoms, physical exam findings, and initial radiographic and laboratory data in the context of their  chronic comorbidities is felt to place them at high risk for further clinical deterioration. Furthermore, it is not anticipated that the patient will be medically stable for discharge from the hospital within 2 midnights of admission.   * I certify that at the point of admission it is my clinical judgment that the patient will require inpatient hospital care spanning beyond 2 midnights from the point of admission due to high  intensity of service, high risk for further deterioration and high frequency of surveillance required.*  Time spent:  58 minutes  Author:  Marinda Elk MD  03/13/2023 9:28 AM

## 2023-03-13 NOTE — TOC Progression Note (Addendum)
Transition of Care [facility]) - Progression Note    Patient Details  Name: Raymond Hays MRN: [id number] Date of Birth: Feb 24, 1946  Transition of Care  [facility]) CM/SW Contact  Margarito Liner, LCSW Phone Number: 03/13/2023, 1:20 PM  Clinical Narrative:  Patient will need prior authorization to return to [facility]. CSW emailed [place] contact to find out what clinicals they need.   3:23 pm: Auth started.  4:06 pm: Pre-approval obtained from the [facility]. SNF can accept patient tomorrow.  Expected Discharge Plan: Skilled Nursing Facility Barriers to Discharge: Continued Medical Work up  Expected Discharge Plan and Services     Post Acute Care Choice: Resumption of Svcs/PTA Provider Living arrangements for the past 2 months: Skilled Nursing Facility                                       Social Determinants of Health (SDOH) Interventions SDOH Screenings   Food Insecurity: No Food Insecurity (03/11/2023)  Housing: Low Risk  (03/11/2023)  Transportation Needs: No Transportation Needs (03/11/2023)  Utilities: Not At Risk (03/11/2023)  Depression (PHQ2-9): Low Risk  (09/25/2022)  Social Connections: Unknown (11/30/2021)   Received from [facility], [facility]  Tobacco Use: Medium Risk (03/12/2023)    Readmission Risk Interventions    03/12/2023    2:19 PM 01/12/2021    1:08 PM 10/20/2020    8:59 AM  Readmission Risk Prevention Plan  Transportation Screening Complete Complete Complete  PCP or Specialist Appt within 3-5 Days Complete  Complete  HRI or Home Care Consult   Complete  Social Work Consult for Recovery Care Planning/Counseling Complete    Palliative Care Screening Not Applicable  Not Applicable  Medication Review Oceanographer) Complete Complete Referral to Pharmacy  PCP or Specialist appointment within 3-5 days of discharge  Complete   HRI or Home Care Consult  Complete   SW Recovery Care/Counseling Consult  Complete   Palliative Care Screening   Not Applicable   Skilled Nursing Facility  Complete

## 2023-03-13 NOTE — Assessment & Plan Note (Signed)
Longstanding inability to ambulate Bedbound This likely increases patient's risk of infection, encouraging patient to sit up is much as possible. Frequent turns per protocol  ankle boots

## 2023-03-13 NOTE — Assessment & Plan Note (Signed)
Hemoglobin A1c 6.8% Accu-Cheks before every meal and nightly with sliding scale insulin

## 2023-03-13 NOTE — Assessment & Plan Note (Signed)
Patient has history of prolonged QT  Monitoring on telemetry Monitoring electrolytes and replacing as necessary Avoiding QT prolonging agents

## 2023-03-13 NOTE — Assessment & Plan Note (Signed)
History of neurogenic bladder with longstanding indwelling suprapubic catheter for at least several years Patient reports catheter was last exchanged 1 week ago. Intermittent leaking around the insertion site Review of previous presentations to our facility reveals that this has been ongoing for several years with intermittent urology consultations bladder spasms has been suspected to be the culprit for which the patient is on oxybutynin.

## 2023-03-13 NOTE — Assessment & Plan Note (Addendum)
Continuing bronchodilator therapy, discontinuing Yupelri and instead placing patient on scheduled DuoNebs Continue home regimen of inhalers  Continuing Short course of oral systemic steroids Treatment of underlying infection as noted above

## 2023-03-13 NOTE — Assessment & Plan Note (Signed)
Vitals:   03/10/23 1900 03/10/23 1930 03/10/23 2030 03/10/23 2130  BP: (!) 155/118 105/75 112/86 (!) 116/93   03/10/23 2200 03/10/23 2230 03/10/23 2330 03/11/23 0000  BP: 107/77 112/81 115/75 90/76   03/11/23 0030 03/11/23 0100 03/11/23 0130 03/11/23 0200  BP: (!) 89/44 103/67 107/71 (!) 126/90  Cont eliquis  and cardizem.

## 2023-03-13 NOTE — Assessment & Plan Note (Addendum)
Patient back in rapid atrial fibrillation Etiology is likely persisting pneumonia In creasing Cardizem to 60 mg every 6 hours Transitioned off of intravenous Cardizem on 12/11 Continuing anticoagulation

## 2023-03-13 NOTE — Assessment & Plan Note (Signed)
Continue levothyroxine at 75 mcg.  ?

## 2023-03-14 DIAGNOSIS — J189 Pneumonia, unspecified organism: Secondary | ICD-10-CM | POA: Diagnosis not present

## 2023-03-14 DIAGNOSIS — Z7901 Long term (current) use of anticoagulants: Secondary | ICD-10-CM | POA: Diagnosis not present

## 2023-03-14 DIAGNOSIS — Z9359 Other cystostomy status: Secondary | ICD-10-CM | POA: Diagnosis not present

## 2023-03-14 DIAGNOSIS — J441 Chronic obstructive pulmonary disease with (acute) exacerbation: Secondary | ICD-10-CM | POA: Diagnosis not present

## 2023-03-14 LAB — GLUCOSE, CAPILLARY
Glucose-Capillary: 150 mg/dL — ABNORMAL HIGH (ref 70–99)
Glucose-Capillary: 102 mg/dL — ABNORMAL HIGH (ref 70–99)
Glucose-Capillary: 225 mg/dL — ABNORMAL HIGH (ref 70–99)
Glucose-Capillary: 213 mg/dL — ABNORMAL HIGH (ref 70–99)

## 2023-03-14 LAB — CBC WITH DIFFERENTIAL/PLATELET
Abs Immature Granulocytes: 0.08 10*3/uL — ABNORMAL HIGH (ref 0.00–0.07)
Basophils Absolute: 0.1 10*3/uL (ref 0.0–0.1)
Basophils Relative: 1 %
Eosinophils Absolute: 0.2 10*3/uL (ref 0.0–0.5)
Eosinophils Relative: 2 %
HCT: 36.9 % — ABNORMAL LOW (ref 39.0–52.0)
Hemoglobin: 11.9 g/dL — ABNORMAL LOW (ref 13.0–17.0)
Immature Granulocytes: 1 %
Lymphocytes Relative: 15 %
Lymphs Abs: 1.5 10*3/uL (ref 0.7–4.0)
MCH: 29 pg (ref 26.0–34.0)
MCHC: 32.2 g/dL (ref 30.0–36.0)
MCV: 90 fL (ref 80.0–100.0)
Monocytes Absolute: 0.6 10*3/uL (ref 0.1–1.0)
Monocytes Relative: 6 %
Neutro Abs: 7.7 10*3/uL (ref 1.7–7.7)
Neutrophils Relative %: 75 %
Platelets: 335 10*3/uL (ref 150–400)
RBC: 4.1 MIL/uL — ABNORMAL LOW (ref 4.22–5.81)
RDW: 17.8 % — ABNORMAL HIGH (ref 11.5–15.5)
WBC: 10 10*3/uL (ref 4.0–10.5)
nRBC: 0 % (ref 0.0–0.2)

## 2023-03-14 LAB — CULTURE, RESPIRATORY W GRAM STAIN: Culture: NORMAL

## 2023-03-14 LAB — RESPIRATORY PANEL BY PCR

## 2023-03-14 LAB — COMPREHENSIVE METABOLIC PANEL
ALT: 14 U/L (ref 0–44)
AST: 21 U/L (ref 15–41)
Albumin: 3.1 g/dL — ABNORMAL LOW (ref 3.5–5.0)
Alkaline Phosphatase: 108 U/L (ref 38–126)
Anion gap: 12 (ref 5–15)
BUN: 27 mg/dL — ABNORMAL HIGH (ref 8–23)
CO2: 23 mmol/L (ref 22–32)
Calcium: 8.8 mg/dL — ABNORMAL LOW (ref 8.9–10.3)
Chloride: 105 mmol/L (ref 98–111)
Creatinine, Ser: 0.94 mg/dL (ref 0.61–1.24)
GFR, Estimated: >60 mL/min (ref >60)
Glucose, Bld: 131 mg/dL — ABNORMAL HIGH (ref 70–99)
Potassium: 4.3 mmol/L (ref 3.5–5.1)
Sodium: 140 mmol/L (ref 135–145)
Total Bilirubin: 0.6 mg/dL (ref <1.2)
Total Protein: 6.7 g/dL (ref 6.5–8.1)

## 2023-03-14 LAB — MAGNESIUM: Magnesium: 2.3 mg/dL (ref 1.7–2.4)

## 2023-03-14 LAB — C-REACTIVE PROTEIN
CRP: 3.6 mg/dL — ABNORMAL HIGH (ref <1.0)
CRP: 2.3 mg/dL — ABNORMAL HIGH (ref <1.0)

## 2023-03-14 MED ORDER — DILTIAZEM HCL ER COATED BEADS 120 MG PO CP24
240.0000 mg | ORAL_CAPSULE | Freq: Every day | ORAL | Status: DC
  Administered 2023-03-15 – 2023-03-16 (×2): 240 mg via ORAL
  Filled 2023-03-14 (×2): qty 2

## 2023-03-14 MED ORDER — SENNA 8.6 MG PO TABS
2.0000 | ORAL_TABLET | Freq: Every day | ORAL | Status: DC
  Administered 2023-03-14 – 2023-03-15 (×2): 17.2 mg via ORAL
  Filled 2023-03-14 (×2): qty 2

## 2023-03-14 MED ORDER — POLYETHYLENE GLYCOL 3350 17 G PO PACK
17.0000 g | PACK | Freq: Every day | ORAL | Status: DC
  Administered 2023-03-14: 17 g via ORAL
  Filled 2023-03-14 (×3): qty 1

## 2023-03-14 MED ORDER — DILTIAZEM HCL 30 MG PO TABS
60.0000 mg | ORAL_TABLET | Freq: Four times a day (QID) | ORAL | Status: AC
Start: 2023-03-14 — End: 2023-03-14
  Administered 2023-03-14 (×2): 60 mg via ORAL
  Filled 2023-03-14 (×2): qty 2

## 2023-03-14 MED ORDER — PSYLLIUM 95 % PO PACK
1.0000 | PACK | Freq: Three times a day (TID) | ORAL | Status: DC
  Administered 2023-03-14 – 2023-03-15 (×3): 1 via ORAL
  Filled 2023-03-14 (×6): qty 1

## 2023-03-14 MED ORDER — SODIUM CHLORIDE 3 % IN NEBU
4.0000 mL | INHALATION_SOLUTION | Freq: Two times a day (BID) | RESPIRATORY_TRACT | Status: DC
  Administered 2023-03-14 – 2023-03-16 (×3): 4 mL via RESPIRATORY_TRACT
  Filled 2023-03-14 (×5): qty 4

## 2023-03-14 NOTE — Assessment & Plan Note (Signed)
 Patient has history of prolonged QT  Monitoring on telemetry Monitoring electrolytes and replacing as necessary Avoiding QT prolonging agents

## 2023-03-14 NOTE — Progress Notes (Signed)
Attempted to get patient out of bed with stedy with nurse tech help.  Patient too stiff and weak to completely stand up to make stedy work.  Held pt. On side of bed for a few minutes then placed back laying down in bed

## 2023-03-14 NOTE — Assessment & Plan Note (Deleted)
 Prn albuterol.  Cont 2 L baseline.

## 2023-03-14 NOTE — Assessment & Plan Note (Signed)
Will continue Eliquis. 

## 2023-03-14 NOTE — TOC Progression Note (Signed)
Transition of Care [facility]) - Progression Note    Patient Details  Name: Raymond Hays MRN: [id number] Date of Birth: 1945/09/16  Transition of Care [facility]) CM/SW Contact  Truddie Hidden, RN Phone Number: 03/14/2023, 1:36 PM  Clinical Narrative:    Sherron Monday with Gavin Pound, Admissions Coordinator for [facility]. Patient can admit to facility tomorrow. MD notified.    Expected Discharge Plan: Skilled Nursing Facility Barriers to Discharge: Barriers Resolved  Expected Discharge Plan and Services     Post Acute Care Choice: Resumption of Svcs/PTA Provider Living arrangements for the past 2 months: Skilled Nursing Facility                                       Social Determinants of Health (SDOH) Interventions SDOH Screenings   Food Insecurity: No Food Insecurity (03/11/2023)  Housing: Low Risk  (03/11/2023)  Transportation Needs: No Transportation Needs (03/11/2023)  Utilities: Not At Risk (03/11/2023)  Depression (PHQ2-9): Low Risk  (09/25/2022)  Social Connections: Unknown (11/30/2021)   Received from [facility], [facility]  Tobacco Use: Medium Risk (03/12/2023)    Readmission Risk Interventions    03/12/2023    2:19 PM 01/12/2021    1:08 PM 10/20/2020    8:59 AM  Readmission Risk Prevention Plan  Transportation Screening Complete Complete Complete  PCP or Specialist Appt within 3-5 Days Complete  Complete  HRI or Home Care Consult   Complete  Social Work Consult for Recovery Care Planning/Counseling Complete    Palliative Care Screening Not Applicable  Not Applicable  Medication Review Oceanographer) Complete Complete Referral to Pharmacy  PCP or Specialist appointment within 3-5 days of discharge  Complete   HRI or Home Care Consult  Complete   SW Recovery Care/Counseling Consult  Complete   Palliative Care Screening  Not Applicable   Skilled Nursing Facility  Complete

## 2023-03-14 NOTE — Assessment & Plan Note (Addendum)
According to nursing patient is experiencing stage one evidence of pressure injury of the bilateral heels. Offloading pressure to heels using boots

## 2023-03-14 NOTE — Assessment & Plan Note (Signed)
 Longstanding inability to ambulate Bedbound This likely increases patient's risk of infection, encouraging patient to sit up is much as possible. Frequent turns per protocol  ankle boots

## 2023-03-14 NOTE — Progress Notes (Signed)
PROGRESS NOTE   Raymond Hays  EAV:[id number] DOB: Nov 11, 1945 DOA: 03/10/2023 PCP: Pearson Grippe, MD   Date of Service: the patient was seen and examined on 03/14/2023  Brief Narrative:  77 y.o. male with medical history significant for osteoarthritis, paroxysmal atrial fibrillation, COPD on 2l [place] with exertion, type 2 diabetes mellitus, hypertension, dyslipidemia brought via EMS to [facility] emergency department for shortness of breath and tachycardia patient was noted to have a heart rate of 140s.  Upon evaluation in the emergency department patient was found to be in atrial fibrillation with rapid ventricular response.  Furthermore clinically the patient was felt to be suffering from a COPD exacerbation.  Chest x-ray additionally revealed evidence of right lower lobe pneumonia.  Patient was initiated on Cardizem infusion, bronchodilator therapy and initiated on intravenous antibiotics.  The hospitalist group was then called to assess the patient for admission to the hospital.    CT angiogram of the chest was performed revealing no evidence of pulmonary embolism but revealing impaction of the right middle and lower lobe bronchi with postobstructive atelectasis or pneumonia.  Chest physiotherapy was therefore ordered in addition to usage of a flutter valve.  Over the course of the initial 24 hours patient's heart rate down trended and patient was transition to oral diltiazem therapy.   Assessment & Plan Pneumonia of right lower lobe due to infectious organism Persisting right lower lobe pneumonia on repeat chest x-ray performed on 12/12 Patient continuing to experience persisting shortness of breath and productive cough Continue intravenous antibiotics with intravenous ceftriaxone and doxycycline Case discussed with Dr. York Pellant with pulmonology, discontinuing Mucomyst, scheduling twice daily nebulized saline, discontinuing inhaled fluticasone, ensuring patient is getting twice daily vest  therapy. Continuing to provide flutter valve Sputum culture obtained earlier in the hospitalization reveals normal respiratory flora. No blood cultures were obtained prior to initiation of antibiotics. Supplemental oxygen as necessary COPD with acute exacerbation (HCC) Continuing bronchodilator therapy with scheduled nebulized DuoNebs Discontinued Pellegrini on 12/12 as this typically is not helpful in acute exacerbation  Continuing Short course of oral systemic steroids will likely need extended taper Treatment of underlying infection as noted above Paroxysmal atrial fibrillation with rapid ventricular response (HCC) Rate control now achieved Transitioning to Cardizem CD 240 mg daily tomorrow morning Etiology of elevated rate is likely persisting pneumonia Transitioned off of intravenous Cardizem on 12/11 Continuing anticoagulation Essential hypertension Patient exhibiting low normal blood pressures with current regimen of Cardizem Parkinson disease (HCC) Cont sinemet IR and gabapentin.  Hypothyroidism Continue levothyroxine at 75 mcg. Prolonged QT interval Patient has history of prolonged QT  Monitoring on telemetry Monitoring electrolytes and replacing as necessary Avoiding QT prolonging agents  AKI (acute kidney injury) (HCC) Mild acute kidney injury with creatinine peaking at 1.53, up from baseline of 1 Creatinine now back at baseline Type 2 diabetes mellitus with diabetic neuropathy, without long-term current use of insulin (HCC) Hemoglobin A1c 6.8% Accu-Cheks before every meal and nightly with sliding scale insulin Rising sugars secondary to steroid use, if persisting will increase to resistant scale Pressure injury of skin According to nursing patient is experiencing stage one evidence of pressure injury of the bilateral heels. Offloading pressure to heels using boots Ambulatory dysfunction Longstanding inability to ambulate Bedbound This likely increases patient's  risk of infection, encouraging patient to sit up is much as possible. Frequent turns per protocol  ankle boots Chronic anticoagulation (ELIQUIS) Will continue Eliquis. Suprapubic catheter (HCC) History of neurogenic bladder with longstanding indwelling suprapubic catheter for at  least several years Patient reports catheter was last exchanged 1 week ago. Intermittent leaking around the insertion site Review of previous presentations to our facility reveals that this has been ongoing for several years with intermittent urology consultations bladder spasms has been suspected to be the culprit for which the patient is on oxybutynin.    Subjective:  Patient states that his shortness of breath is somewhat improved however he still experiencing severe cough to the point where he feels that he is choking sometimes.  Cough subjective with tan sputum.  Severe cough associated with ongoing shortness of breath.  Patient denies any associated chest pain.     Physical Exam:  Vitals:   03/13/23 2005 03/13/23 2307 03/14/23 0400 03/14/23 0731  BP:  125/63 120/74   Pulse:  (!) 107 80   Resp:  17 18   Temp:  97.9 F (36.6 C)  97.8 F (36.6 C)  TempSrc:  Oral  Oral  SpO2: 100% 99% 97%   Weight:      Height:         Constitutional: Awake alert and oriented x3, is in mild distress due to frequent coughing. Skin: no rashes, no lesions, good skin turgor noted. Eyes: Pupils are equally reactive to light.  No evidence of scleral icterus or conjunctival pallor.  ENMT: Moist mucous membranes noted.  Posterior pharynx clear of any exudate or lesions.   Respiratory: Patient continuing to exhibit coarse breath sounds bilaterally with significant rhonchi although breath sounds are more clear than yesterday.   Cardiovascular: Irregularly irregular rhythm, improved rate control compared to yesterday, no murmurs / rubs / gallops. No extremity edema. 2+ pedal pulses. No carotid bruits.  Abdomen: Abdomen is  protuberant but soft and nontender.  No evidence of intra-abdominal masses.  Positive bowel sounds noted in all quadrants.   Musculoskeletal: Extremely poor muscle tone of the bilateral lower extremities.   Data Reviewed:  I have personally reviewed and interpreted labs, imaging.  Significant findings are   CBC: Recent Labs  Lab 03/10/23 1841 03/11/23 0605 03/12/23 0317 03/13/23 0401  WBC 10.7* 10.0 8.0 7.0  NEUTROABS 9.3*  --   --  5.1  HGB 12.5* 12.4* 11.2* 11.1*  HCT 37.8* 38.0* 34.4* 33.6*  MCV 88.9 89.0 90.3 88.9  PLT 323 333 280 284   Basic Metabolic Panel: Recent Labs  Lab 03/10/23 1841 03/10/23 2137 03/11/23 0605 03/12/23 0317 03/13/23 0401  NA 134*  --  135 134* 139  K 4.5  --  4.1 3.6 3.6  CL 96*  --  96* 98 104  CO2 27  --  28 27 29   GLUCOSE 174*  --  124* 115* 137*  BUN 40*  --  42* 37* 29*  CREATININE 1.30*  --  1.53* 1.24 0.91  CALCIUM 9.2  --  9.4 8.6* 8.4*  MG  --  2.0 2.2 2.4 2.2   GFR: Estimated Creatinine Clearance: 74.9 mL/min (by C-G formula based on SCr of 0.91 mg/dL). Liver Function Tests: Recent Labs  Lab 03/10/23 1841 03/11/23 0605 03/13/23 0401  AST 24 18 16   ALT 12 14 11   ALKPHOS 103 98 105  BILITOT 0.8 0.8 0.5  PROT 7.7 7.6 6.3*  ALBUMIN 3.6 3.5 2.8*    EKG/Telemetry: Personally reviewed.  Rhythm is atrial fibrillation with heart rate of 80 bpm.  No dynamic ST segment changes appreciated.   Code Status:  Full code.  Code status decision has been confirmed with: patient Family Communication: Case discussed  with patient's son via phone conversation.  Details of this conversation are in a separate note dated 12/11.   Severity of Illness:  The appropriate patient status for this patient is INPATIENT. Inpatient status is judged to be reasonable and necessary in order to provide the required intensity of service to ensure the patient's safety. The patient's presenting symptoms, physical exam findings, and initial radiographic  and laboratory data in the context of their chronic comorbidities is felt to place them at high risk for further clinical deterioration. Furthermore, it is not anticipated that the patient will be medically stable for discharge from the hospital within 2 midnights of admission.   * I certify that at the point of admission it is my clinical judgment that the patient will require inpatient hospital care spanning beyond 2 midnights from the point of admission due to high intensity of service, high risk for further deterioration and high frequency of surveillance required.*  Time spent:  59 minutes  Author:  Marinda Elk MD  03/14/2023 9:21 AM

## 2023-03-14 NOTE — Assessment & Plan Note (Signed)
 Vitals:   03/10/23 1900 03/10/23 1930 03/10/23 2030 03/10/23 2130  BP: (!) 155/118 105/75 112/86 (!) 116/93   03/10/23 2200 03/10/23 2230 03/10/23 2330 03/11/23 0000  BP: 107/77 112/81 115/75 90/76   03/11/23 0030 03/11/23 0100 03/11/23 0130 03/11/23 0200  BP: (!) 89/44 103/67 107/71 (!) 126/90  Cont eliquis  and cardizem.

## 2023-03-14 NOTE — Assessment & Plan Note (Addendum)
 Cont sinemet IR and gabapentin.

## 2023-03-14 NOTE — Assessment & Plan Note (Addendum)
Mild acute kidney injury with creatinine peaking at 1.53, up from baseline of 1 Creatinine now back at baseline

## 2023-03-14 NOTE — Assessment & Plan Note (Addendum)
Rate control now achieved Transitioning to Cardizem CD 240 mg daily tomorrow morning Etiology of elevated rate is likely persisting pneumonia Transitioned off of intravenous Cardizem on 12/11 Continuing anticoagulation

## 2023-03-14 NOTE — Assessment & Plan Note (Signed)
 History of neurogenic bladder with longstanding indwelling suprapubic catheter for at least several years Patient reports catheter was last exchanged 1 week ago. Intermittent leaking around the insertion site Review of previous presentations to our facility reveals that this has been ongoing for several years with intermittent urology consultations bladder spasms has been suspected to be the culprit for which the patient is on oxybutynin.

## 2023-03-14 NOTE — Assessment & Plan Note (Signed)
Continue levothyroxine at 75 mcg.  ?

## 2023-03-14 NOTE — Assessment & Plan Note (Addendum)
Persisting right lower lobe pneumonia on repeat chest x-ray performed on 12/12 Patient continuing to experience persisting shortness of breath and productive cough Continue intravenous antibiotics with intravenous ceftriaxone and doxycycline Case discussed with Dr. York Pellant with pulmonology, discontinuing Mucomyst, scheduling twice daily nebulized saline, discontinuing inhaled fluticasone, ensuring patient is getting twice daily vest therapy. Continuing to provide flutter valve Sputum culture obtained earlier in the hospitalization reveals normal respiratory flora. No blood cultures were obtained prior to initiation of antibiotics. Supplemental oxygen as necessary

## 2023-03-14 NOTE — Assessment & Plan Note (Addendum)
Continuing bronchodilator therapy with scheduled nebulized DuoNebs Discontinued Pellegrini on 12/12 as this typically is not helpful in acute exacerbation  Continuing Short course of oral systemic steroids will likely need extended taper Treatment of underlying infection as noted above

## 2023-03-14 NOTE — Plan of Care (Addendum)
  Problem: Education: Goal: Ability to describe self-care measures that may prevent or decrease complications (Diabetes Survival Skills Education) will improve Outcome: Progressing   Problem: Coping: Goal: Ability to adjust to condition or change in health will improve Outcome: Progressing   Problem: Fluid Volume: Goal: Ability to maintain a balanced intake and output will improve Outcome: Progressing   Problem: Nutritional: Goal: Maintenance of adequate nutrition will improve Outcome: Progressing   Problem: Skin Integrity: Goal: Risk for impaired skin integrity will decrease Outcome: Progressing   Problem: Clinical Measurements: Goal: Ability to maintain clinical measurements within normal limits will improve Outcome: Progressing   Problem: Nutrition: Goal: Adequate nutrition will be maintained Outcome: Progressing   Problem: Pain Management: Goal: General experience of comfort will improve Outcome: Progressing   Problem: Cardiac: Goal: Ability to achieve and maintain adequate cardiopulmonary perfusion will improve Outcome: Progressing

## 2023-03-14 NOTE — Assessment & Plan Note (Addendum)
Hemoglobin A1c 6.8% Accu-Cheks before every meal and nightly with sliding scale insulin Rising sugars secondary to steroid use, if persisting will increase to resistant scale

## 2023-03-15 ENCOUNTER — Inpatient Hospital Stay: Payer: No Typology Code available for payment source

## 2023-03-15 DIAGNOSIS — J441 Chronic obstructive pulmonary disease with (acute) exacerbation: Secondary | ICD-10-CM | POA: Diagnosis not present

## 2023-03-15 DIAGNOSIS — Z9359 Other cystostomy status: Secondary | ICD-10-CM | POA: Diagnosis not present

## 2023-03-15 DIAGNOSIS — Z7901 Long term (current) use of anticoagulants: Secondary | ICD-10-CM | POA: Diagnosis not present

## 2023-03-15 DIAGNOSIS — J189 Pneumonia, unspecified organism: Secondary | ICD-10-CM | POA: Diagnosis not present

## 2023-03-15 LAB — GLUCOSE, CAPILLARY
Glucose-Capillary: 128 mg/dL — ABNORMAL HIGH (ref 70–99)
Glucose-Capillary: 283 mg/dL — ABNORMAL HIGH (ref 70–99)

## 2023-03-15 LAB — COMPREHENSIVE METABOLIC PANEL
ALT: 12 U/L (ref 0–44)
AST: 17 U/L (ref 15–41)
Albumin: 2.9 g/dL — ABNORMAL LOW (ref 3.5–5.0)
Alkaline Phosphatase: 102 U/L (ref 38–126)
Anion gap: 7 (ref 5–15)
BUN: 26 mg/dL — ABNORMAL HIGH (ref 8–23)
CO2: 23 mmol/L (ref 22–32)
Calcium: 8.5 mg/dL — ABNORMAL LOW (ref 8.9–10.3)
Chloride: 103 mmol/L (ref 98–111)
Creatinine, Ser: 0.76 mg/dL (ref 0.61–1.24)
GFR, Estimated: >60 mL/min (ref >60)
Glucose, Bld: 156 mg/dL — ABNORMAL HIGH (ref 70–99)
Potassium: 4 mmol/L (ref 3.5–5.1)
Sodium: 139 mmol/L (ref 135–145)
Total Bilirubin: 0.5 mg/dL (ref <1.2)
Total Protein: 6.7 g/dL (ref 6.5–8.1)

## 2023-03-15 LAB — CBC WITH DIFFERENTIAL/PLATELET
Abs Immature Granulocytes: 0.09 10*3/uL — ABNORMAL HIGH (ref 0.00–0.07)
Basophils Absolute: 0 10*3/uL (ref 0.0–0.1)
Basophils Relative: 0 %
Eosinophils Absolute: 0 10*3/uL (ref 0.0–0.5)
Eosinophils Relative: 0 %
HCT: 34.8 % — ABNORMAL LOW (ref 39.0–52.0)
Hemoglobin: 11.4 g/dL — ABNORMAL LOW (ref 13.0–17.0)
Immature Granulocytes: 1 %
Lymphocytes Relative: 11 %
Lymphs Abs: 1 10*3/uL (ref 0.7–4.0)
MCH: 29.2 pg (ref 26.0–34.0)
MCHC: 32.8 g/dL (ref 30.0–36.0)
MCV: 89.2 fL (ref 80.0–100.0)
Monocytes Absolute: 0.5 10*3/uL (ref 0.1–1.0)
Monocytes Relative: 6 %
Neutro Abs: 7.3 10*3/uL (ref 1.7–7.7)
Neutrophils Relative %: 82 %
Platelets: 329 10*3/uL (ref 150–400)
RBC: 3.9 MIL/uL — ABNORMAL LOW (ref 4.22–5.81)
RDW: 17.6 % — ABNORMAL HIGH (ref 11.5–15.5)
WBC: 9 10*3/uL (ref 4.0–10.5)
nRBC: 0 % (ref 0.0–0.2)

## 2023-03-15 LAB — MAGNESIUM: Magnesium: 2.3 mg/dL (ref 1.7–2.4)

## 2023-03-15 LAB — PHOSPHORUS: Phosphorus: 2.5 mg/dL (ref 2.5–4.6)

## 2023-03-15 MED ORDER — INSULIN ASPART 100 UNIT/ML IJ SOLN
0.0000 [IU] | Freq: Three times a day (TID) | INTRAMUSCULAR | Status: DC
  Administered 2023-03-15: 7 [IU] via SUBCUTANEOUS
  Administered 2023-03-16: 3 [IU] via SUBCUTANEOUS
  Filled 2023-03-15 (×2): qty 1

## 2023-03-15 NOTE — Assessment & Plan Note (Addendum)
 Vitals:   03/10/23 1900 03/10/23 1930 03/10/23 2030 03/10/23 2130  BP: (!) 155/118 105/75 112/86 (!) 116/93   03/10/23 2200 03/10/23 2230 03/10/23 2330 03/11/23 0000  BP: 107/77 112/81 115/75 90/76   03/11/23 0030 03/11/23 0100 03/11/23 0130 03/11/23 0200  BP: (!) 89/44 103/67 107/71 (!) 126/90  Cont eliquis  and cardizem.

## 2023-03-15 NOTE — Assessment & Plan Note (Addendum)
Hemoglobin A1c 6.8% Accu-Cheks before every meal and nightly with sliding scale insulin Rising sugars secondary to steroid use, if persisting will increase to resistant scale

## 2023-03-15 NOTE — Assessment & Plan Note (Addendum)
 Cont sinemet IR and gabapentin.

## 2023-03-15 NOTE — Assessment & Plan Note (Addendum)
See assessment and plan above

## 2023-03-15 NOTE — Plan of Care (Signed)
  Problem: Education: Goal: Ability to describe self-care measures that may prevent or decrease complications (Diabetes Survival Skills Education) will improve Outcome: Progressing Goal: Individualized Educational Video(s) Outcome: Progressing   Problem: Coping: Goal: Ability to adjust to condition or change in health will improve Outcome: Progressing   Problem: Fluid Volume: Goal: Ability to maintain a balanced intake and output will improve Outcome: Progressing   Problem: Health Behavior/Discharge Planning: Goal: Ability to identify and utilize available resources and services will improve Outcome: Progressing Goal: Ability to manage health-related needs will improve Outcome: Progressing   Problem: Metabolic: Goal: Ability to maintain appropriate glucose levels will improve Outcome: Progressing   Problem: Nutritional: Goal: Maintenance of adequate nutrition will improve Outcome: Progressing Goal: Progress toward achieving an optimal weight will improve Outcome: Progressing   Problem: Skin Integrity: Goal: Risk for impaired skin integrity will decrease Outcome: Progressing   Problem: Tissue Perfusion: Goal: Adequacy of tissue perfusion will improve Outcome: Progressing   Problem: Education: Goal: Knowledge of General Education information will improve Description: Including pain rating scale, medication(s)/side effects and non-pharmacologic comfort measures Outcome: Progressing   Problem: Health Behavior/Discharge Planning: Goal: Ability to manage health-related needs will improve Outcome: Progressing   Problem: Clinical Measurements: Goal: Ability to maintain clinical measurements within normal limits will improve Outcome: Progressing Goal: Will remain free from infection Outcome: Progressing Goal: Diagnostic test results will improve Outcome: Progressing Goal: Respiratory complications will improve Outcome: Progressing Goal: Cardiovascular complication will  be avoided Outcome: Progressing   Problem: Activity: Goal: Risk for activity intolerance will decrease Outcome: Progressing   Problem: Nutrition: Goal: Adequate nutrition will be maintained Outcome: Progressing   Problem: Coping: Goal: Level of anxiety will decrease Outcome: Progressing   Problem: Elimination: Goal: Will not experience complications related to bowel motility Outcome: Progressing Goal: Will not experience complications related to urinary retention Outcome: Progressing   Problem: Pain Management: Goal: General experience of comfort will improve Outcome: Progressing   Problem: Safety: Goal: Ability to remain free from injury will improve Outcome: Progressing   Problem: Skin Integrity: Goal: Risk for impaired skin integrity will decrease Outcome: Progressing   Problem: Education: Goal: Knowledge of disease or condition will improve Outcome: Progressing Goal: Understanding of medication regimen will improve Outcome: Progressing Goal: Individualized Educational Video(s) Outcome: Progressing   Problem: Activity: Goal: Ability to tolerate increased activity will improve Outcome: Progressing   Problem: Cardiac: Goal: Ability to achieve and maintain adequate cardiopulmonary perfusion will improve Outcome: Progressing   Problem: Health Behavior/Discharge Planning: Goal: Ability to safely manage health-related needs after discharge will improve Outcome: Progressing

## 2023-03-15 NOTE — Assessment & Plan Note (Addendum)
Will continue Eliquis. 

## 2023-03-15 NOTE — Assessment & Plan Note (Addendum)
Rate control now achieved Patient has been switched to Cardizem CD 240 mg daily today. Bouts atrial fibrillation likely exacerbated by ongoing pneumonia. Transitioned off of intravenous Cardizem on 12/11 Continuing anticoagulation

## 2023-03-15 NOTE — Assessment & Plan Note (Addendum)
Continue levothyroxine at 75 mcg.  ?

## 2023-03-15 NOTE — Assessment & Plan Note (Addendum)
 Patient has history of prolonged QT  Monitoring on telemetry Monitoring electrolytes and replacing as necessary Avoiding QT prolonging agents

## 2023-03-15 NOTE — Assessment & Plan Note (Addendum)
According to nursing patient is experiencing stage one evidence of pressure injury of the bilateral heels. Offloading pressure to heels using boots

## 2023-03-15 NOTE — Assessment & Plan Note (Addendum)
Mild acute kidney injury with creatinine peaking at 1.53, up from baseline of 1 Creatinine now back at baseline

## 2023-03-15 NOTE — TOC Progression Note (Signed)
Transition of Care [facility]) - Progression Note    Patient Details  Name: Raymond Hays MRN: [id number] Date of Birth: Sep 19, 1945  Transition of [facility]) CM/SW Contact  Bing Quarry, RN Phone Number: 03/15/2023, 11:20 AM  Clinical Narrative: 12/24: Provider feels patient needs another day in acute care. Notified AC at [facility], Gavin Pound, and can still be admitted Sunday as returning patient. Notified provider.   Gabriel Cirri MSN RN CM  Care Management Department.  [place]  [facility] Direct Dial: [phone] Main Office Phone: [phone] Weekends Only        Expected Discharge Plan: Skilled Nursing Facility Barriers to Discharge: Barriers Resolved  Expected Discharge Plan and Services     Post Acute Care Choice: Resumption of Svcs/PTA Provider Living arrangements for the past 2 months: Skilled Nursing Facility                                       Social Determinants of Health (SDOH) Interventions SDOH Screenings   Food Insecurity: No Food Insecurity (03/11/2023)  Housing: Low Risk  (03/11/2023)  Transportation Needs: No Transportation Needs (03/11/2023)  Utilities: Not At Risk (03/11/2023)  Depression (PHQ2-9): Low Risk  (09/25/2022)  Social Connections: Unknown (11/30/2021)   [facility] from [facility], [facility]  Tobacco Use: Medium Risk (03/12/2023)    Readmission Risk Interventions    03/12/2023    2:19 PM 01/12/2021    1:08 PM 10/20/2020    8:59 AM  Readmission Risk Prevention Plan  Transportation Screening Complete Complete Complete  PCP or Specialist Appt within 3-5 Days Complete  Complete  HRI or Home Care Consult   Complete  Social Work Consult for Recovery Care Planning/Counseling Complete    Palliative Care Screening Not Applicable  Not Applicable  Medication Review [employer]) Complete Complete Referral to Pharmacy  PCP or Specialist appointment within 3-5 days of discharge  Complete   HRI or Home Care Consult   Complete   SW Recovery Care/Counseling Consult  Complete   Palliative Care Screening  Not Applicable   Skilled Nursing Facility  Complete

## 2023-03-15 NOTE — Evaluation (Addendum)
Occupational Therapy Evaluation Patient Details Name: Raymond Hays MRN: [id number] DOB: 08/03/1945 Today's Date: 03/15/2023   History of Present Illness Raymond Hays is a 77 y.o. male with medical history significant for osteoarthritis, paroxysmal atrial fibrillation, COPD, type diabetes mellitus, hypertension, dyslipidemia and spinal stenosis, brought via EMS for shortness of breath and tachycardia patient was noted to have a heart rate of 140s at his facility and increasing shortness of breath patient has COPD and is on chronic oxygen at 2 L baseline but uses it intermittently.   Clinical Impression   Patient agreeable to OT/PT co-treatment to maximize safety and participation. Pt presents to OT with SOB, decreased strength, impaired BLE sensation, and impaired UE ROM. He is from [facility] where he is a long term resident. At facility, pt is dependent for all transfers, IADLs, and most ADLs. During evaluation, he was able to complete self-feeding with set up A, supine<>sit with Max A x2, and scooting up in bed with Mod A x2. He appears to be at baseline level of function in ADLs and functional mobility, pt agrees. At this time, pt does not demonstrate any acute OT needs. Will complete orders.    If plan is discharge home, recommend the following: Assist for transportation;Assistance with feeding;Direct supervision/assist for financial management;Direct supervision/assist for medications management;Two people to help with bathing/dressing/bathroom;Two people to help with walking and/or transfers    Functional Status Assessment  Patient has not had a recent decline in their functional status  Equipment Recommendations  None recommended by OT    Recommendations for Other Services       Precautions / Restrictions Precautions Precautions: Fall Restrictions Weight Bearing Restrictions Per Provider Order: No      Mobility Bed Mobility Overal bed mobility: Needs Assistance Bed Mobility:  Sit to Supine, Supine to Sit     Supine to sit: Max assist, +2 for physical assistance Sit to supine: Max assist, +2 for physical assistance   General bed mobility comments: Scooting up to Raymond Hays Mod A x2    Transfers     General transfer comment: NT, pt uses mechanical lift at facility to complete transfers      Balance Overall balance assessment: Needs assistance   Sitting balance-Leahy Scale: Poor Sitting balance - Comments: Static sitting balance unsupported - poor. Static sitting balance supported with UE support - fair. CGA for safety. 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 Pt reports 2 wrist surgeries (several years ago) with hardware from wrist to back of hand, which limits wrist flex/ext. Per pt, unable to complete SF d/t previous injuries from weightlifting. 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      Prior Functioning/Environment Prior Level of Function : Needs assist       Physical Assist : Mobility (physical);ADLs (physical)     Mobility Comments: At facility, pt reports sitting at EOB with help. Staff use sit to stand mechanical lift Rozell Searing?) to transfer pt to bed, manual w/c, and shower. Pt is non-ambulatory. He last worked with PT >1 year ago (ran out of SNF days). Uses 2L O2 at baseline intermittently. ADLs Comments: Pt is dependent for all ADLs except self-feeding (set up A). Staff give him a bath in shower 2x/wk, sponge bath other times. Incontinent of bowel & bladder (foley catheter, uses bed pan at facility).        OT Problem List: Decreased strength;Decreased range of motion;Decreased activity tolerance;Impaired balance (sitting and/or standing);Impaired vision/perception;Decreased coordination;Cardiopulmonary status limiting activity;Impaired sensation;Impaired UE functional use;Obesity      OT Treatment/Interventions:      OT Goals(Current goals can be found in the care plan section) Acute Rehab OT Goals Patient Stated Goal: return to facility OT Goal Formulation: All assessment and education complete, DC therapy  OT Frequency:      Co-evaluation PT/OT/SLP Co-Evaluation/Treatment: Yes Reason for Co-Treatment: Complexity of the patient's impairments (multi-system involvement);For patient/therapist safety;To address functional/ADL transfers PT goals addressed during session: Mobility/safety with mobility;Balance OT goals addressed during session: ADL's and self-care      AM-PAC OT "6 Clicks" Daily Activity     Outcome Measure Help from another person eating meals?: A Little Help from another  person taking care of personal grooming?: A Lot Help from another person toileting, which includes using toliet, bedpan, or urinal?: Total Help from another person bathing (including washing, rinsing, drying)?: Total Help from another person to put on and taking off regular upper body clothing?: A Lot Help from another person to put on and taking off regular lower body clothing?: Total 6 Click Score: 10   End of Session Nurse Communication: Mobility status  Activity Tolerance: Patient limited by fatigue Patient left: in bed;with call bell/phone within reach;with bed alarm set  OT Visit Diagnosis: Other abnormalities of gait and mobility (R26.89)                Time: 8469-6295 OT Time Calculation (min): 22 min Charges:  OT General Charges $OT Visit: 1 Visit OT Evaluation $OT Eval Moderate Complexity: 1 Mod  [facility] [place] MS, OTR/L ascom [phone]  03/15/23, 11:40 AM

## 2023-03-15 NOTE — Assessment & Plan Note (Addendum)
 Longstanding inability to ambulate Bedbound This likely increases patient's risk of infection, encouraging patient to sit up is much as possible. Frequent turns per protocol  ankle boots

## 2023-03-15 NOTE — Progress Notes (Signed)
See assessment and plan above. PROGRESS NOTE   Raymond Hays  ZOX:[id number] DOB: 1945-07-20 DOA: 03/10/2023 PCP: Pearson Grippe, MD   Date of Service: the patient was seen and examined on 03/15/2023  Brief Narrative:  77 y.o. male with medical history significant for osteoarthritis, paroxysmal atrial fibrillation, COPD on 2l [place] with exertion, type 2 diabetes mellitus, hypertension, dyslipidemia brought via EMS to [facility] emergency department for shortness of breath and tachycardia patient was noted to have a heart rate of 140s.  Upon evaluation in the emergency department patient was found to be in atrial fibrillation with rapid ventricular response.  Furthermore clinically the patient was felt to be suffering from a COPD exacerbation.  Chest x-ray additionally revealed evidence of right lower lobe pneumonia.  Patient was initiated on Cardizem infusion, bronchodilator therapy and initiated on intravenous antibiotics.  The hospitalist group was then called to assess the patient for admission to the hospital.    CT angiogram of the chest was performed revealing no evidence of pulmonary embolism but revealing impaction of the right middle and lower lobe bronchi with postobstructive atelectasis or pneumonia.  Chest physiotherapy was therefore ordered in addition to usage of a flutter valve.  As the patient's rate control improved patient was eventually transitioned from diltiazem infusion to oral diltiazem therapy.    In the days that followed, management of patient's pneumonia was complicated by extremely harsh cough with significant difficulty in mobilizing secretions.  Case was discussed with Dr. Aundria Rud and patient was managed with an aggressive regimen of vest therapy and nebulized saline alongside Acapella valve therapy bronchodilators and systemic steroids.  Assessment & Plan Pneumonia of right lower lobe due to infectious organism Persisting right lower lobe pneumonia on repeat chest x-ray  performed on 12/12 Patient continuing to experience persisting shortness of breath and productive cough Continue intravenous antibiotics with intravenous ceftriaxone and doxycycline Case discussed with Dr. Aundria Rud with pulmonology on 12/13, their input is appreciated. Continuing vest therapy twice daily, hypertonic saline twice daily, flutter valve therapy, aggressive bronchodilator therapy and systemic steroids Sputum culture obtained earlier in the hospitalization reveals normal respiratory flora. No blood cultures were obtained prior to initiation of antibiotics. Supplemental oxygen as necessary Anticipate is in the next 24 to 48 hours. COPD with acute exacerbation (HCC) See assessment and plan above Paroxysmal atrial fibrillation with rapid ventricular response (HCC) Rate control now achieved Patient has been switched to Cardizem CD 240 mg daily today. Bouts atrial fibrillation likely exacerbated by ongoing pneumonia. Transitioned off of intravenous Cardizem on 12/11 Continuing anticoagulation Essential hypertension Patient exhibiting low normal blood pressures with current regimen of Cardizem Parkinson disease (HCC) Cont sinemet IR and gabapentin.  Hypothyroidism Continue levothyroxine at 75 mcg. Prolonged QT interval Patient has history of prolonged QT  Monitoring on telemetry Monitoring electrolytes and replacing as necessary Avoiding QT prolonging agents  AKI (acute kidney injury) (HCC) Mild acute kidney injury with creatinine peaking at 1.53, up from baseline of 1 Creatinine now back at baseline Type 2 diabetes mellitus with diabetic neuropathy, without long-term current use of insulin (HCC) Hemoglobin A1c 6.8% Accu-Cheks before every meal and nightly with sliding scale insulin Rising sugars secondary to steroid use, if persisting will increase to resistant scale Pressure injury of skin According to nursing patient is experiencing stage one evidence of pressure injury of  the bilateral heels. Offloading pressure to heels using boots Ambulatory dysfunction Longstanding inability to ambulate Bedbound This likely increases patient's risk of infection, encouraging patient to sit up is  much as possible. Frequent turns per protocol  ankle boots Chronic anticoagulation (ELIQUIS) Will continue Eliquis. Suprapubic catheter (HCC) History of neurogenic bladder with longstanding indwelling suprapubic catheter for at least several years Patient reports catheter was last exchanged 1 week prior to presentation. Intermittent leaking around the insertion site Review of previous presentations to our facility reveals that this has been ongoing for several years with intermittent urology consultations bladder spasms has been suspected to be the culprit for which the patient is on oxybutynin.    Subjective:  Patient states that his shortness of breath has improved however he still experiencing frequent cough with significant amount of sputum production.  Physical Exam:  Vitals:   03/15/23 1505 03/15/23 1529 03/15/23 1558 03/15/23 1700  BP:      Pulse: 99     Resp:      Temp:  97.9 F (36.6 C)  97.7 F (36.5 C)  TempSrc:  Oral  Oral  SpO2: 99%  98%   Weight:      Height:         Constitutional: Awake alert and oriented x3, is in mild distress due to frequent coughing. Skin: no rashes, no lesions, good skin turgor noted. Eyes: Pupils are equally reactive to light.  No evidence of scleral icterus or conjunctival pallor.  ENMT: Moist mucous membranes noted.  Posterior pharynx clear of any exudate or lesions.   Respiratory: Patient continuing to exhibit coarse breath sounds bilaterally with significant rhonchi although breath sounds Cardiovascular: Irregularly irregular rhythm, improved rate control compared to yesterday, no murmurs / rubs / gallops. No extremity edema. 2+ pedal pulses. No carotid bruits.  Abdomen: Abdomen is protuberant but soft and nontender.  No  evidence of intra-abdominal masses.  Positive bowel sounds noted in all quadrants.   Musculoskeletal: Extremely poor muscle tone of the bilateral lower extremities.   Data Reviewed:  I have personally reviewed and interpreted labs, imaging.  Significant findings are   CBC: Recent Labs  Lab 03/10/23 1841 03/11/23 0605 03/12/23 0317 03/13/23 0401 03/14/23 1056 03/15/23 0340  WBC 10.7* 10.0 8.0 7.0 10.0 9.0  NEUTROABS 9.3*  --   --  5.1 7.7 7.3  HGB 12.5* 12.4* 11.2* 11.1* 11.9* 11.4*  HCT 37.8* 38.0* 34.4* 33.6* 36.9* 34.8*  MCV 88.9 89.0 90.3 88.9 90.0 89.2  PLT 323 333 280 284 335 329   Basic Metabolic Panel: Recent Labs  Lab 03/11/23 0605 03/12/23 0317 03/13/23 0401 03/14/23 1056 03/15/23 0340  NA 135 134* 139 140 139  K 4.1 3.6 3.6 4.3 4.0  CL 96* 98 104 105 103  CO2 28 27 29 23 23   GLUCOSE 124* 115* 137* 131* 156*  BUN 42* 37* 29* 27* 26*  CREATININE 1.53* 1.24 0.91 0.94 0.76  CALCIUM 9.4 8.6* 8.4* 8.8* 8.5*  MG 2.2 2.4 2.2 2.3 2.3  PHOS  --   --   --   --  2.5   GFR: Estimated Creatinine Clearance: 85.3 mL/min (by C-G formula based on SCr of 0.76 mg/dL). Liver Function Tests: Recent Labs  Lab 03/10/23 1841 03/11/23 0605 03/13/23 0401 03/14/23 1056 03/15/23 0340  AST 24 18 16 21 17   ALT 12 14 11 14 12   ALKPHOS 103 98 105 108 102  BILITOT 0.8 0.8 0.5 0.6 0.5  PROT 7.7 7.6 6.3* 6.7 6.7  ALBUMIN 3.6 3.5 2.8* 3.1* 2.9*    EKG/Telemetry: Personally reviewed.  Rhythm is atrial fibrillation with heart rate of 80 bpm.  No dynamic ST segment  changes appreciated.   Code Status:  Full code.  Code status decision has been confirmed with: patient Family Communication: Case discussed with patient's son via phone conversation.  Details of this conversation are in a separate note dated 12/11.   Severity of Illness:  The appropriate patient status for this patient is INPATIENT. Inpatient status is judged to be reasonable and necessary in order to provide the  required intensity of service to ensure the patient's safety. The patient's presenting symptoms, physical exam findings, and initial radiographic and laboratory data in the context of their chronic comorbidities is felt to place them at high risk for further clinical deterioration. Furthermore, it is not anticipated that the patient will be medically stable for discharge from the hospital within 2 midnights of admission.   * I certify that at the point of admission it is my clinical judgment that the patient will require inpatient hospital care spanning beyond 2 midnights from the point of admission due to high intensity of service, high risk for further deterioration and high frequency of surveillance required.*  Time spent:  50 minutes  Author:  Marinda Elk MD  03/15/2023 7:32 PM

## 2023-03-15 NOTE — Evaluation (Signed)
Physical Therapy Evaluation Patient Details Name: Raymond Hays MRN: [id number] DOB: Jul 28, 1945 Today's Date: 03/15/2023  History of Present Illness  Raymond Hays is a 77 y.o. male with medical history significant for osteoarthritis, paroxysmal atrial fibrillation, COPD, type diabetes mellitus, hypertension, dyslipidemia and spinal stenosis, brought via EMS for shortness of breath and tachycardia patient was noted to have a heart rate of 140s at his facility and increasing shortness of breath patient has COPD and is on chronic oxygen at 2 L baseline but uses it intermittently.  Clinical Impression  77 yo Male admitted with pneumonia was living at [facility] prior to admittance. He reports numbness in legs from waist down but intact deep pressure. He exhibits minimal to no active movement in legs. He requires max A +2 for bed mobility and transition supine to sit. Patient uses a mechanical sit to stand lift at long term care facility for all transfers. He is currently functioning at baseline and therefore does not demonstrate need for skilled PT intervention. Nursing notified of need of mechanical sit to stand lift for transfers. Patient educated on use of spirometer for pulmonary hygiene and verbalized understanding. All needs met at evaluation, will discharge in house at this time.         If plan is discharge home, recommend the following: Two people to help with walking and/or transfers;Two people to help with bathing/dressing/bathroom;Direct supervision/assist for medications management;Assist for transportation;Assistance with cooking/housework   Can travel by private vehicle        Equipment Recommendations None recommended by PT  Recommendations for Other Services       Functional Status Assessment Patient has not had a recent decline in their functional status     Precautions / Restrictions Precautions Precautions: Fall Restrictions Weight Bearing Restrictions Per Provider  Order: No      Mobility  Bed Mobility Overal bed mobility: Needs Assistance Bed Mobility: Sit to Supine, Supine to Sit     Supine to sit: Max assist, +2 for physical assistance Sit to supine: Max assist, +2 for physical assistance   General bed mobility comments: Scooting up to [facility] Mod A x2    Transfers                   General transfer comment: NT, pt uses mechanical lift at facility to complete transfers; informed nursing he would need mechanical sit to stand lift for transfers while in facility    Ambulation/Gait                  Stairs            Wheelchair Mobility     Tilt Bed    Modified Rankin (Stroke Patients Only)       Balance Overall balance assessment: Needs assistance   Sitting balance-Leahy Scale: Poor Sitting balance - Comments: Static sitting balance unsupported - poor. Static sitting balance supported with UE support - fair. CGA for safety. Postural control: Posterior lean                                   Pertinent Vitals/Pain Pain Assessment Pain Assessment: No/denies pain    Home Living Family/patient expects to be discharged to:: Skilled nursing facility                   Additional Comments: Pt is resident at [facility].    Prior Function  Prior Level of Function : Needs assist       Physical Assist : Mobility (physical);ADLs (physical)     Mobility Comments: At facility, pt reports sitting at EOB with help. Staff use sit to stand mechanical lift Raymond Hays?) to transfer pt to bed, manual w/c, and shower. Pt is non-ambulatory. He last worked with PT >1 year ago (ran out of SNF days). Uses 2L O2 at baseline intermittently. ADLs Comments: Pt is dependent for all ADLs except self-feeding (set up A). Staff give him a bath in shower 2x/wk, sponge bath other times. Incontinent of bowel & bladder (foley catheter, uses bed pan at facility).     Extremity/Trunk Assessment   Upper Extremity  Assessment Upper Extremity Assessment: Defer to OT evaluation LUE Deficits / Details: Pt endorsed ligament/tendon injury in digits 3-5 (severely limited extension). Pt reports 2 wrist surgeries (several years ago) with hardware from wrist to back of hand, which limits wrist flex/ext. Per pt, unable to complete SF d/t previous injuries from weightlifting. LUE Coordination: decreased fine motor;decreased gross motor    Lower Extremity Assessment Lower Extremity Assessment: LLE deficits/detail;RLE deficits/detail;Generalized weakness (Pt endorsed being "numb" from the waist down, stated "my legs don't work anymore" d/t PD) RLE Deficits / Details: able to lift foot but otherwise minimal AROM in lower extremity RLE Sensation: decreased light touch LLE Deficits / Details: able to lift foot but otherwise minimal AROM in lower extremity LLE Sensation: decreased light touch       Communication   Communication Communication: No apparent difficulties  Cognition Arousal: Alert Behavior During Therapy: WFL for tasks assessed/performed Overall Cognitive Status: Within Functional Limits for tasks assessed                                          General Comments General comments (skin integrity, edema, etc.): Instructed patient in spirometer use while in hospital for better pulmonary hygiene; he verbalized understanding; Spo2 >95% throughout evaluation    Exercises     Assessment/Plan    PT Assessment Patient does not need any further PT services  PT Problem List         PT Treatment Interventions      PT Goals (Current goals can be found in the Care Plan section)  Acute Rehab PT Goals Patient Stated Goal: return to Long term care facility PT Goal Formulation: With patient Time For Goal Achievement: 03/15/23 Potential to Achieve Goals: Good    Frequency       Co-evaluation   Reason for Co-Treatment: Complexity of the patient's impairments (multi-system  involvement);For patient/therapist safety;To address functional/ADL transfers PT goals addressed during session: Mobility/safety with mobility;Balance OT goals addressed during session: ADL's and self-care       AM-PAC PT "6 Clicks" Mobility  Outcome Measure Help needed turning from your back to your side while in a flat bed without using bedrails?: A Lot Help needed moving from lying on your back to sitting on the side of a flat bed without using bedrails?: A Lot Help needed moving to and from a bed to a chair (including a wheelchair)?: Total Help needed standing up from a chair using your arms (e.g., wheelchair or bedside chair)?: Total Help needed to walk in hospital room?: Total Help needed climbing 3-5 steps with a railing? : Total 6 Click Score: 8    End of Session   Activity Tolerance: Patient limited  by fatigue Patient left: in bed;with call bell/phone within reach;with bed alarm set;Other (comment) (pt needs to be changed after experiencing BM, RN notified) Nurse Communication: Mobility status;Other (comment) (needs changing after soiling linens) PT Visit Diagnosis: Muscle weakness (generalized) (M62.81)    Time: 4854-6270 PT Time Calculation (min) (ACUTE ONLY): 19 min   Charges:   PT Evaluation $PT Eval Low Complexity: 1 Low   PT General Charges $$ ACUTE PT VISIT: 1 Visit         Ashdon Gillson PT, DPT 03/15/2023, 3:09 PM

## 2023-03-15 NOTE — Assessment & Plan Note (Addendum)
Persisting right lower lobe pneumonia on repeat chest x-ray performed on 12/12 Patient continuing to experience persisting shortness of breath and productive cough Continue intravenous antibiotics with intravenous ceftriaxone and doxycycline Case discussed with Dr. Aundria Rud with pulmonology on 12/13, their input is appreciated. Continuing vest therapy twice daily, hypertonic saline twice daily, flutter valve therapy, aggressive bronchodilator therapy and systemic steroids Sputum culture obtained earlier in the hospitalization reveals normal respiratory flora. No blood cultures were obtained prior to initiation of antibiotics. Supplemental oxygen as necessary Anticipate is in the next 24 to 48 hours.

## 2023-03-15 NOTE — Assessment & Plan Note (Addendum)
History of neurogenic bladder with longstanding indwelling suprapubic catheter for at least several years Patient reports catheter was last exchanged 1 week prior to presentation. Intermittent leaking around the insertion site Review of previous presentations to our facility reveals that this has been ongoing for several years with intermittent urology consultations bladder spasms has been suspected to be the culprit for which the patient is on oxybutynin.

## 2023-03-16 ENCOUNTER — Inpatient Hospital Stay: Payer: No Typology Code available for payment source

## 2023-03-16 DIAGNOSIS — E038 Other specified hypothyroidism: Secondary | ICD-10-CM

## 2023-03-16 DIAGNOSIS — Z9359 Other cystostomy status: Secondary | ICD-10-CM | POA: Diagnosis not present

## 2023-03-16 DIAGNOSIS — J441 Chronic obstructive pulmonary disease with (acute) exacerbation: Secondary | ICD-10-CM | POA: Diagnosis not present

## 2023-03-16 DIAGNOSIS — J189 Pneumonia, unspecified organism: Secondary | ICD-10-CM | POA: Diagnosis not present

## 2023-03-16 DIAGNOSIS — Z7901 Long term (current) use of anticoagulants: Secondary | ICD-10-CM | POA: Diagnosis not present

## 2023-03-16 LAB — COMPREHENSIVE METABOLIC PANEL
ALT: 9 U/L (ref 0–44)
AST: 17 U/L (ref 15–41)
Albumin: 3 g/dL — ABNORMAL LOW (ref 3.5–5.0)
Alkaline Phosphatase: 106 U/L (ref 38–126)
Anion gap: 7 (ref 5–15)
BUN: 27 mg/dL — ABNORMAL HIGH (ref 8–23)
CO2: 26 mmol/L (ref 22–32)
Calcium: 9 mg/dL (ref 8.9–10.3)
Chloride: 105 mmol/L (ref 98–111)
Creatinine, Ser: 0.78 mg/dL (ref 0.61–1.24)
GFR, Estimated: >60 mL/min (ref >60)
Glucose, Bld: 189 mg/dL — ABNORMAL HIGH (ref 70–99)
Potassium: 4.6 mmol/L (ref 3.5–5.1)
Sodium: 138 mmol/L (ref 135–145)
Total Bilirubin: 0.5 mg/dL (ref <1.2)
Total Protein: 7 g/dL (ref 6.5–8.1)

## 2023-03-16 LAB — CBC WITH DIFFERENTIAL/PLATELET
Abs Immature Granulocytes: 0.12 10*3/uL — ABNORMAL HIGH (ref 0.00–0.07)
Basophils Absolute: 0 10*3/uL (ref 0.0–0.1)
Basophils Relative: 0 %
Eosinophils Absolute: 0 10*3/uL (ref 0.0–0.5)
Eosinophils Relative: 0 %
HCT: 36.7 % — ABNORMAL LOW (ref 39.0–52.0)
Hemoglobin: 11.7 g/dL — ABNORMAL LOW (ref 13.0–17.0)
Immature Granulocytes: 1 %
Lymphocytes Relative: 10 %
Lymphs Abs: 1 10*3/uL (ref 0.7–4.0)
MCH: 29.1 pg (ref 26.0–34.0)
MCHC: 31.9 g/dL (ref 30.0–36.0)
MCV: 91.3 fL (ref 80.0–100.0)
Monocytes Absolute: 0.5 10*3/uL (ref 0.1–1.0)
Monocytes Relative: 6 %
Neutro Abs: 8.2 10*3/uL — ABNORMAL HIGH (ref 1.7–7.7)
Neutrophils Relative %: 83 %
Platelets: 341 10*3/uL (ref 150–400)
RBC: 4.02 MIL/uL — ABNORMAL LOW (ref 4.22–5.81)
RDW: 17.7 % — ABNORMAL HIGH (ref 11.5–15.5)
WBC: 9.8 10*3/uL (ref 4.0–10.5)
nRBC: 0 % (ref 0.0–0.2)

## 2023-03-16 LAB — C-REACTIVE PROTEIN: CRP: 0.8 mg/dL (ref <1.0)

## 2023-03-16 LAB — MAGNESIUM: Magnesium: 2.3 mg/dL (ref 1.7–2.4)

## 2023-03-16 LAB — GLUCOSE, CAPILLARY: Glucose-Capillary: 154 mg/dL — ABNORMAL HIGH (ref 70–99)

## 2023-03-16 MED ORDER — DILTIAZEM HCL ER COATED BEADS 240 MG PO CP24: 240.0000 mg | ORAL_CAPSULE | Freq: Every day | ORAL | Status: AC

## 2023-03-16 MED ORDER — CEFDINIR 300 MG PO CAPS: 300.0000 mg | ORAL_CAPSULE | Freq: Two times a day (BID) | ORAL | Status: AC

## 2023-03-16 MED ORDER — SENNA 8.6 MG PO TABS: 2.0000 | ORAL_TABLET | Freq: Every day | ORAL | Status: DC

## 2023-03-16 MED ORDER — PREDNISONE 10 MG PO TABS: ORAL_TABLET | ORAL | Status: AC

## 2023-03-16 MED ORDER — DOXYCYCLINE HYCLATE 100 MG PO TABS: 100.0000 mg | ORAL_TABLET | Freq: Two times a day (BID) | ORAL | Status: AC

## 2023-03-16 NOTE — Discharge Summary (Addendum)
Physician Discharge Summary   Patient: Raymond Hays MRN: [id number] DOB: 04-Jun-1945  Admit date:     03/10/2023  Discharge date: 03/16/23  Discharge Physician: Marinda Elk   PCP: Pearson Grippe, MD   Recommendations at discharge:   Patient is full code  Patient evidence on bedrest but may get out of bed to wheelchair or chair with complete assistance Please provide patient with both incentive spirometer and flutter valve.  Encouraged him to use each hourly while awake until symptoms from his current bout of pneumonia have resolved.   Please take all prescribed medications exactly as instructed including the remaining course of the patient's Cardizem, antibiotic regimen and tapering regimen of steroids. Please Dooms a low-sodium diet Follow-up with facility provider per protocol.  Patient would also benefit from follow-up with his established pulmonologist in the next 2 to 4 weeks.   Please bring patient back to the emergency department if he develops worsening shortness of breath, confusion, fevers of greater than 100.4 F or weakness.  Prevalon boots should be used to offload patient's heels  Discharge Diagnoses: Principal Problem:   Pneumonia of right lower lobe due to infectious organism Active Problems:   Chronic anticoagulation (ELIQUIS)   Suprapubic catheter (HCC)   COPD with acute exacerbation (HCC)   Paroxysmal atrial fibrillation with rapid ventricular response (HCC)   Essential hypertension   Parkinson disease (HCC)   Hypothyroidism   Ambulatory dysfunction   Prolonged QT interval   AKI (acute kidney injury) (HCC)   Type 2 diabetes mellitus with diabetic neuropathy, without long-term current use of insulin (HCC)   Pressure injury of skin  Resolved Problems:   * No resolved hospital problems. *   Hospital Course: 77 y.o. male with medical history significant for osteoarthritis, paroxysmal atrial fibrillation, COPD on 2l [place] with exertion, type 2 diabetes  mellitus, hypertension, dyslipidemia brought via EMS to [facility] emergency department for shortness of breath and tachycardia patient was noted to have a heart rate of 140s.  Upon evaluation in the emergency department patient was found to be in atrial fibrillation with rapid ventricular response.  Furthermore clinically the patient was felt to be suffering from a COPD exacerbation.  Chest x-ray additionally revealed evidence of right lower lobe pneumonia.  Patient was initiated on Cardizem infusion, bronchodilator therapy and initiated on intravenous antibiotics.  The hospitalist group was then called to assess the patient for admission to the hospital.    CT angiogram of the chest was performed revealing no evidence of pulmonary embolism but revealing impaction of the right middle and lower lobe bronchi with postobstructive atelectasis or pneumonia.  Chest physiotherapy was therefore ordered in addition to usage of a flutter valve.  As the patient's rate control improved patient was eventually transitioned from diltiazem infusion to oral diltiazem therapy and eventually transition to Cardizem CD 240 mg once daily prior to discharge with good rate control.  In the days that followed, management of patient's pneumonia was complicated by extremely harsh cough with significant difficulty in mobilizing secretions.  Case was discussed with Dr. Aundria Rud and patient was managed with an aggressive regimen of vest therapy and nebulized saline alongside Acapella valve therapy bronchodilators and systemic steroids.  As the patient clinically improved with resolving infiltrates on chest x-ray and normalizing CRP.  Patient's severe cough and shortness of breath also began to improve.  Considering patient's chronic bedbound status patient will always be at high risk of pulmonary infections and great efforts must be taken by his  facility keep patient sitting up is much as possible throughout the day.  Patient was discharged  back to his skilled nursing facility in improved and stable condition with the remaining course of antibiotics, tapering dose of steroids and new regimen of long-acting Cardizem.     Consultants: None Procedures performed: none  Disposition: Skilled nursing facility Diet recommendation:  Cardiac diet  DISCHARGE MEDICATION: Allergies as of 03/16/2023   No Known Allergies      Medication List     STOP taking these medications    acetaminophen 325 MG tablet Commonly known as: TYLENOL       TAKE these medications    acetic acid 0.25 % irrigation Irrigate with 1 Application as directed every 12 (twelve) hours. (Flush foley catheter)   Acidophilus Probiotic Blend Caps Take 1 capsule by mouth daily.   Albuterol Sulfate 108 (90 Base) MCG/ACT Aepb Commonly known as: PROAIR RESPICLICK Inhale 2 puffs into the lungs every 4 (four) hours as needed (COPD).   allopurinol 100 MG tablet Commonly known as: ZYLOPRIM Take 200 mg by mouth daily.   apixaban 5 MG Tabs tablet Commonly known as: ELIQUIS Take 5 mg by mouth 2 (two) times daily.   ascorbic acid 500 MG tablet Commonly known as: VITAMIN C Take 500 mg by mouth daily.   atorvastatin 20 MG tablet Commonly known as: LIPITOR Take 20 mg by mouth at bedtime.   Biofreeze 4 % Gel Generic drug: Menthol (Topical Analgesic) Apply 1 application  topically 3 (three) times daily. (Apply to neck)   CALCIUM 500 PO Take 2 tablets by mouth every 6 (six) hours as needed (indigestion/heartburn).   carbidopa-levodopa 25-100 MG tablet Commonly known as: SINEMET IR Take 1 tablet by mouth 3 (three) times daily before meals.   cefdinir 300 MG capsule Commonly known as: OMNICEF Take 1 capsule (300 mg total) by mouth 2 (two) times daily for 3 days.   cetaphil cream Apply 1 Application topically 2 (two) times daily as needed.   cetirizine 5 MG tablet Commonly known as: ZYRTEC Take 5 mg by mouth at bedtime.   citalopram 10 MG  tablet Commonly known as: CELEXA Take 10 mg by mouth at bedtime.   diltiazem 240 MG 24 hr capsule Commonly known as: CARDIZEM CD Take 1 capsule (240 mg total) by mouth daily. Start taking on: March 17, 2023   docusate sodium 100 MG capsule Commonly known as: COLACE Take 100 mg by mouth daily.   doxycycline 100 MG tablet Commonly known as: VIBRA-TABS Take 1 tablet (100 mg total) by mouth 2 (two) times daily for 3 days.   famotidine 20 MG tablet Commonly known as: PEPCID Take 20 mg by mouth at bedtime.   ferrous sulfate 324 (65 Fe) MG Tbec Take 1 tablet by mouth daily.   fluticasone 50 MCG/ACT nasal spray Commonly known as: FLONASE Place 2 sprays into both nostrils daily.   Fluticasone-Umeclidin-Vilant 100-62.5-25 MCG/ACT Aepb Inhale 1 puff into the lungs daily.   furosemide 20 MG tablet Commonly known as: LASIX Take 20 mg by mouth daily as needed for edema.   gabapentin 800 MG tablet Commonly known as: NEURONTIN Take 800 mg by mouth 4 (four) times daily.   guaifenesin 100 MG/5ML syrup Commonly known as: ROBITUSSIN Take 200 mg by mouth every 4 (four) hours as needed for cough.   hydrocortisone 25 MG suppository Commonly known as: ANUSOL-HC Place 25 mg rectally every 4 (four) hours as needed for hemorrhoids or anal itching.   hydrOXYzine  25 MG tablet Commonly known as: ATARAX Take 25 mg by mouth every 6 (six) hours as needed for itching.   ipratropium 0.06 % nasal spray Commonly known as: ATROVENT Place 2 sprays into both nostrils 3 (three) times daily.   ipratropium-albuterol 0.5-2.5 (3) MG/3ML Soln Commonly known as: DUONEB Take 3 mLs by nebulization every 6 (six) hours.   lactase 3000 units tablet Commonly known as: LACTAID Take 3,000 Units by mouth 3 (three) times daily with meals.   levothyroxine 75 MCG tablet Commonly known as: SYNTHROID Take 75 mcg by mouth daily before breakfast.   lidocaine 4 % Place 1 patch onto the skin daily as needed  (pain). (Remove after 12 hours)   melatonin 5 MG Tabs Take 10 mg by mouth at bedtime.   Metamucil Smooth Texture 58.6 % powder Generic drug: psyllium Take 1 packet by mouth 3 (three) times daily.   methocarbamol 500 MG tablet Commonly known as: ROBAXIN Take 1,000 mg by mouth every 12 (twelve) hours. At 1400   nortriptyline 10 MG capsule Commonly known as: PAMELOR Take 20 mg by mouth at bedtime.   OcuSoft Lid Scrub Original Pads Place 1 Application into both eyes daily. Wipe both eyelids once per day.   oxybutynin 5 MG tablet Commonly known as: DITROPAN Take 5 mg by mouth 2 (two) times daily.   polyethylene glycol 17 g packet Commonly known as: MIRALAX / GLYCOLAX Take 17 g by mouth daily.   pramoxine 1 % Lotn Commonly known as: SARNA SENSITIVE Apply 1 Application topically 4 (four) times daily as needed.   predniSONE 10 MG tablet Commonly known as: DELTASONE Take 4 tablets (40 mg total) by mouth daily for 3 days, THEN 3 tablets (30 mg total) daily for 3 days, THEN 2 tablets (20 mg total) daily for 3 days, THEN 1 tablet (10 mg total) daily for 3 days. Start taking on: March 16, 2023   Propylene Glycol 0.6 % Soln Place 2 drops into both eyes in the morning, at noon, and at bedtime.   selenium sulfide 1 % Lotn Commonly known as: SELSUN Apply 1 Application topically 2 (two) times a week. (Tuesday and Friday)   senna 8.6 MG Tabs tablet Commonly known as: SENOKOT Take 1 tablet (8.6 mg total) by mouth daily as needed for mild constipation. What changed: when to take this   senna 8.6 MG Tabs tablet Commonly known as: SENOKOT Take 2 tablets (17.2 mg total) by mouth at bedtime. What changed: You were already taking a medication with the same name, and this prescription was added. Make sure you understand how and when to take each.   silver sulfADIAZINE 1 % cream Commonly known as: SILVADENE Apply 1 Application topically daily. (Apply to arms and face)   sodium  chloride 0.65 % Soln nasal spray Commonly known as: OCEAN Place 2 sprays into both nostrils every 6 (six) hours as needed for congestion.   tacrolimus 0.03 % ointment Commonly known as: PROTOPIC Apply 1 application  topically daily. (Apply to eyelids)   terbinafine 1 % cream Commonly known as: LAMISIL Apply 1 Application topically at bedtime. (Apply to feet)   tiZANidine 2 MG tablet Commonly known as: ZANAFLEX Take 2 mg by mouth 2 (two) times daily as needed for muscle spasms.   triamcinolone cream 0.1 % Commonly known as: KENALOG Apply 1 Application topically daily as needed. Apply to arms, legs, and back r/t rash   Vitamin D-3 25 MCG (1000 UT) Caps Take 1,000 Units by  mouth daily.        Contact information for after-discharge care     Destination     [facility] .   Service: Skilled Nursing Contact information: [address] [place] [address] [phone]                     Discharge Exam: Ceasar Mons Weights   03/10/23 1837 03/13/23 0300  Weight: 100.7 kg 100.2 kg    Constitutional: Awake alert and oriented x3, no associated distress.   Respiratory: Breath sounds continue to be somewhat coarse with signal recurrent rhonchi however much improved air entry compared to prior examinations.. Normal respiratory effort. No accessory muscle use.  Cardiovascular: Regular rate and rhythm, no murmurs / rubs / gallops. No extremity edema. 2+ pedal pulses. No carotid bruits.  Abdomen: Abdomen is protuberant but soft and nontender.  No evidence of intra-abdominal masses.  Positive bowel sounds noted in all quadrants.   Musculoskeletal: No joint deformity upper and lower extremities. Good ROM, no contractures.  Definitely poor muscle tone of the lower extremities. Condition at discharge: fair  The results of significant diagnostics from this hospitalization (including imaging, microbiology, ancillary and laboratory) are listed below  for reference.   Imaging Studies: DG Chest 1 View Result Date: 03/16/2023 CLINICAL DATA:  77 year old male with history of pneumonia. EXAM: CHEST  1 VIEW COMPARISON:  Chest x-ray 03/15/2023. FINDINGS: Lung volumes remain low. Ill-defined opacity at the right base likely reflective of residual atelectasis and/or consolidation. Overall, aeration appears improved compared to the prior study. Left lung is clear. No definite pleural effusions. No pneumothorax. No evidence of pulmonary edema. Heart size is mildly enlarged. Upper mediastinal contours are within normal limits. Atherosclerotic calcifications are noted in the thoracic aorta. Mitra-Clips are noted. Orthopedic fixation hardware throughout the lower cervical spine incidentally noted. IMPRESSION: 1. Persistent low lung volumes with resolving areas of atelectasis and/or consolidation in the right lung base. 2. Cardiomegaly. 3. Aortic atherosclerosis. Electronically Signed   By: Trudie Reed M.D.   On: 03/16/2023 07:02   DG Chest 1 View Result Date: 03/15/2023 CLINICAL DATA:  [id number] Pneumonia [id number] EXAM: CHEST  1 VIEW COMPARISON:  03/13/2023 chest radiograph. FINDINGS: Partially visualized spinal fusion hardware overlying the lower cervical spine. Stable cardiomediastinal silhouette with mild cardiomegaly. No pneumothorax. Probable trace right pleural effusion, stable. No left pleural effusion. Similar hazy patchy peripheral lower right lung opacity. No pulmonary edema. IMPRESSION: 1. Similar hazy patchy peripheral lower right lung opacity, compatible with reported pneumonia. Recommend follow-up PA and lateral post treatment chest radiographs in 4-6 weeks. 2. Stable probable trace right pleural effusion. 3. Stable mild cardiomegaly. Electronically Signed   By: Delbert Phenix M.D.   On: 03/15/2023 11:26   DG Chest 1 View Result Date: 03/13/2023 CLINICAL DATA:  [id number] Pneumonia [id number] EXAM: CHEST  1 VIEW COMPARISON:  03/10/2023 FINDINGS: Stable  cardiomediastinal contours. Aortic atherosclerosis. Progressive airspace consolidation within the periphery of the right lower lobe. Minimally increased interstitial markings at the left lung base. No pleural effusion or pneumothorax. IMPRESSION: Progressive airspace consolidation within the periphery of the right lower lobe, compatible with pneumonia. Electronically Signed   By: Duanne Guess D.O.   On: 03/13/2023 14:53   CT Angio Chest Pulmonary Embolism (PE) W or WO Contrast Result Date: 03/10/2023 CLINICAL DATA:  Concern for pulmonary edema. EXAM: CT ANGIOGRAPHY CHEST WITH CONTRAST TECHNIQUE: Multidetector CT imaging of the chest was performed using the standard protocol during bolus administration of  intravenous contrast. Multiplanar CT image reconstructions and MIPs were obtained to evaluate the vascular anatomy. RADIATION DOSE REDUCTION: This exam was performed according to the departmental dose-optimization program which includes automated exposure control, adjustment of the mA and/or kV according to patient size and/or use of iterative reconstruction technique. CONTRAST:  OMNIPAQUE IOHEXOL 350 MG/ML SOLN COMPARISON:  Chest CT dated 01/09/2021. Radiograph dated 03/10/2023. FINDINGS: Cardiovascular: There is no cardiomegaly or pericardial effusion. Three-vessel coronary vascular calcification. There is retrograde flow of contrast from the right atrium into the IVC suggestive of right heart dysfunction. Mild atherosclerotic calcification of the thoracic aorta. No aneurysmal dilatation. No pulmonary artery embolus identified. Mediastinum/Nodes: No hilar or mediastinal adenopathy. The esophagus is grossly unremarkable. No mediastinal fluid collection. Lungs/Pleura: There is impaction of the right middle and right lower lobe bronchi with postobstructive atelectasis or pneumonia. This likely represents mucous impaction, however an endobronchial lesion is not excluded. Follow-up to resolution  recommended. The left lung is clear. No pleural effusion or pneumothorax. Upper Abdomen: No acute abnormality. Musculoskeletal: Osteopenia with multilevel degenerative changes. No acute osseous pathology. Review of the MIP images confirms the above findings. IMPRESSION: 1. No CT evidence of pulmonary artery embolus. 2. Impaction of the right middle and right lower lobe bronchi with postobstructive atelectasis or pneumonia. Follow-up to resolution recommended. 3. Coronary vascular calcification. 4.  Aortic Atherosclerosis (ICD10-I70.0). Electronically Signed   By: Elgie Collard M.D.   On: 03/10/2023 22:42   DG Chest Portable 1 View Result Date: 03/10/2023 CLINICAL DATA:  Dyspnea EXAM: PORTABLE CHEST 1 VIEW COMPARISON:  09/17/2022 chest radiograph. FINDINGS: Stable cardiomediastinal silhouette with mild cardiomegaly. No pneumothorax. Possible small right pleural effusion. No left pleural effusion. Low lung volumes. No pulmonary edema. Patchy right lung base opacity is new. IMPRESSION: 1. New patchy right lung base opacity, suspicious for pneumonia. Chest radiograph follow-up to resolution recommended. 2. Possible small right pleural effusion. 3. Stable mild cardiomegaly.  No pulmonary edema. Electronically Signed   By: Delbert Phenix M.D.   On: 03/10/2023 19:54    Microbiology: Results for orders placed or performed during the hospital encounter of 03/10/23  Resp panel by RT-PCR (RSV, Flu A&B, Covid) Anterior Nasal Swab     Status: None   Collection Time: 03/10/23  6:41 PM   Specimen: Anterior Nasal Swab  Result Value Ref Range Status   SARS Coronavirus 2 by RT PCR NEGATIVE NEGATIVE Final    Comment: (NOTE) SARS-CoV-2 target nucleic acids are NOT DETECTED.  The SARS-CoV-2 RNA is generally detectable in upper respiratory specimens during the acute phase of infection. The lowest concentration of SARS-CoV-2 viral copies this assay can detect is 138 copies/mL. A negative result does not preclude  SARS-Cov-2 infection and should not be used as the sole basis for treatment or other patient management decisions. A negative result may occur with  improper specimen collection/handling, submission of specimen other than nasopharyngeal swab, presence of viral mutation(s) within the areas targeted by this assay, and inadequate number of viral copies(<138 copies/mL). A negative result must be combined with clinical observations, patient history, and epidemiological information. The expected result is Negative.  Fact Sheet for Patients:  BloggerCourse.com  Fact Sheet for Healthcare Providers:  SeriousBroker.it  This test is no t yet approved or cleared by the [place] FDA and  has been authorized for detection and/or diagnosis of SARS-CoV-2 by FDA under an Emergency Use Authorization (EUA). This EUA will remain  in effect (meaning this test can be used) for the duration  of the COVID-19 declaration under Section 564(b)(1) of the Act, 21 U.S.C.section 360bbb-3(b)(1), unless the authorization is terminated  or revoked sooner.       Influenza A by PCR NEGATIVE NEGATIVE Final   Influenza B by PCR NEGATIVE NEGATIVE Final    Comment: (NOTE) The Xpert Xpress SARS-CoV-2/FLU/RSV plus assay is intended as an aid in the diagnosis of influenza from Nasopharyngeal swab specimens and should not be used as a sole basis for treatment. Nasal washings and aspirates are unacceptable for Xpert Xpress SARS-CoV-2/FLU/RSV testing.  Fact Sheet for Patients: BloggerCourse.com  Fact Sheet for Healthcare Providers: SeriousBroker.it  This test is not yet approved or cleared by the [place] FDA and has been authorized for detection and/or diagnosis of SARS-CoV-2 by FDA under an Emergency Use Authorization (EUA). This EUA will remain in effect (meaning this test can be used) for the duration of  the COVID-19 declaration under Section 564(b)(1) of the Act, 21 U.S.C. section 360bbb-3(b)(1), unless the authorization is terminated or revoked.     Resp Syncytial Virus by PCR NEGATIVE NEGATIVE Final    Comment: (NOTE) Fact Sheet for Patients: BloggerCourse.com  Fact Sheet for Healthcare Providers: SeriousBroker.it  This test is not yet approved or cleared by the [place] FDA and has been authorized for detection and/or diagnosis of SARS-CoV-2 by FDA under an Emergency Use Authorization (EUA). This EUA will remain in effect (meaning this test can be used) for the duration of the COVID-19 declaration under Section 564(b)(1) of the Act, 21 U.S.C. section 360bbb-3(b)(1), unless the authorization is terminated or revoked.  Performed at [facility], [address] [place], [place] [address]   Expectorated Sputum Assessment w Gram Stain, Rflx to Resp Cult     Status: None   Collection Time: 03/11/23  2:54 PM   Specimen: Expectorated Sputum  Result Value Ref Range Status   Specimen Description EXPSU  Final   Special Requests NONE  Final   Sputum evaluation   Final    THIS SPECIMEN IS ACCEPTABLE FOR SPUTUM CULTURE Performed at [facility], [address] [place], [place] [address]    Report Status 03/11/2023 FINAL  Final  Culture, Respiratory w Gram Stain     Status: None   Collection Time: 03/11/23  2:54 PM  Result Value Ref Range Status   Specimen Description   Final    EXPSU Performed at [facility], [address], [place], [place] [address]    Special Requests   Final    NONE Reflexed from [id number] Performed at [facility], [address] [place], [place] [address]    Gram Stain   Final    FEW WBC PRESENT,BOTH PMN AND MONONUCLEAR FEW GRAM POSITIVE COCCI IN PAIRS RARE GRAM POSITIVE RODS    Culture   Final    FEW Normal respiratory flora-no Staph aureus or Pseudomonas  seen Performed at [facility], [address] [place], [place] [address]    Report Status 03/14/2023 FINAL  Final  Respiratory (~20 pathogens) panel by PCR     Status: None   Collection Time: 03/14/23  1:28 PM   Specimen: Nasopharyngeal Swab; Respiratory  Result Value Ref Range Status   Adenovirus NOT DETECTED NOT DETECTED Final   Coronavirus 229E NOT DETECTED NOT DETECTED Final    Comment: (NOTE) The Coronavirus on the Respiratory Panel, DOES NOT test for the novel  Coronavirus (2019 nCoV)    Coronavirus HKU1 NOT DETECTED NOT DETECTED Final  Coronavirus NL63 NOT DETECTED NOT DETECTED Final   Coronavirus OC43 NOT DETECTED NOT DETECTED Final   Metapneumovirus NOT DETECTED NOT DETECTED Final   Rhinovirus / Enterovirus NOT DETECTED NOT DETECTED Final   Influenza A NOT DETECTED NOT DETECTED Final   Influenza B NOT DETECTED NOT DETECTED Final   Parainfluenza Virus 1 NOT DETECTED NOT DETECTED Final   Parainfluenza Virus 2 NOT DETECTED NOT DETECTED Final   Parainfluenza Virus 3 NOT DETECTED NOT DETECTED Final   Parainfluenza Virus 4 NOT DETECTED NOT DETECTED Final   Respiratory Syncytial Virus NOT DETECTED NOT DETECTED Final   Bordetella pertussis NOT DETECTED NOT DETECTED Final   Bordetella Parapertussis NOT DETECTED NOT DETECTED Final   Chlamydophila pneumoniae NOT DETECTED NOT DETECTED Final   Mycoplasma pneumoniae NOT DETECTED NOT DETECTED Final    Comment: Performed at [facility], [address] [place], [place] [address]    Labs: CBC: Recent Labs  Lab 03/10/23 1841 03/11/23 0605 03/12/23 0317 03/13/23 0401 03/14/23 1056 03/15/23 0340 03/16/23 0357  WBC 10.7*   < > 8.0 7.0 10.0 9.0 9.8  NEUTROABS 9.3*  --   --  5.1 7.7 7.3 8.2*  HGB 12.5*   < > 11.2* 11.1* 11.9* 11.4* 11.7*  HCT 37.8*   < > 34.4* 33.6* 36.9* 34.8* 36.7*  MCV 88.9   < > 90.3 88.9 90.0 89.2 91.3  PLT 323   < > 280 284 335 329 341   < > = values in this interval not displayed.   Basic  Metabolic Panel: Recent Labs  Lab 03/12/23 0317 03/13/23 0401 03/14/23 1056 03/15/23 0340 03/16/23 0357  NA 134* 139 140 139 138  K 3.6 3.6 4.3 4.0 4.6  CL 98 104 105 103 105  CO2 27 29 23 23 26   GLUCOSE 115* 137* 131* 156* 189*  BUN 37* 29* 27* 26* 27*  CREATININE 1.24 0.91 0.94 0.76 0.78  CALCIUM 8.6* 8.4* 8.8* 8.5* 9.0  MG 2.4 2.2 2.3 2.3 2.3  PHOS  --   --   --  2.5  --    Liver Function Tests: Recent Labs  Lab 03/11/23 0605 03/13/23 0401 03/14/23 1056 03/15/23 0340 03/16/23 0357  AST 18 16 21 17 17   ALT 14 11 14 12 9   ALKPHOS 98 105 108 102 106  BILITOT 0.8 0.5 0.6 0.5 0.5  PROT 7.6 6.3* 6.7 6.7 7.0  ALBUMIN 3.5 2.8* 3.1* 2.9* 3.0*   CBG: Recent Labs  Lab 03/14/23 1630 03/14/23 2118 03/15/23 0807 03/15/23 2203 03/16/23 0815  GLUCAP 225* 213* 128* 283* 154*    Discharge time spent: greater than 30 minutes.  Signed: Marinda Elk, MD [facility] 03/16/2023

## 2023-03-16 NOTE — Discharge Instructions (Addendum)
Patient is full code  Patient evidence on bedrest but may get out of bed to wheelchair or chair with complete assistance Please provide patient with both incentive spirometer and flutter valve.  Encouraged him to use each hourly while awake until symptoms from his current bout of pneumonia have resolved.   Please take all prescribed medications exactly as instructed including the remaining course of the patient's Cardizem, antibiotic regimen and tapering regimen of steroids. Please Dooms a low-sodium diet Follow-up with facility provider per protocol.  Patient would also benefit from follow-up with his established pulmonologist in the next 2 to 4 weeks.   Please bring patient back to the emergency department if he develops worsening shortness of breath, confusion, fevers of greater than 100.4 F or weakness.   Please offload patient's heels with Prevalon boots

## 2023-03-16 NOTE — Plan of Care (Signed)
  Problem: Education: Goal: Ability to describe self-care measures that may prevent or decrease complications (Diabetes Survival Skills Education) will improve Outcome: Progressing Goal: Individualized Educational Video(s) Outcome: Progressing   Problem: Coping: Goal: Ability to adjust to condition or change in health will improve Outcome: Progressing   Problem: Fluid Volume: Goal: Ability to maintain a balanced intake and output will improve Outcome: Progressing   Problem: Health Behavior/Discharge Planning: Goal: Ability to identify and utilize available resources and services will improve Outcome: Progressing Goal: Ability to manage health-related needs will improve Outcome: Progressing   Problem: Metabolic: Goal: Ability to maintain appropriate glucose levels will improve Outcome: Progressing   Problem: Nutritional: Goal: Maintenance of adequate nutrition will improve Outcome: Progressing Goal: Progress toward achieving an optimal weight will improve Outcome: Progressing   Problem: Skin Integrity: Goal: Risk for impaired skin integrity will decrease Outcome: Progressing   Problem: Tissue Perfusion: Goal: Adequacy of tissue perfusion will improve Outcome: Progressing   Problem: Education: Goal: Knowledge of General Education information will improve Description: Including pain rating scale, medication(s)/side effects and non-pharmacologic comfort measures Outcome: Progressing   Problem: Health Behavior/Discharge Planning: Goal: Ability to manage health-related needs will improve Outcome: Progressing   Problem: Clinical Measurements: Goal: Ability to maintain clinical measurements within normal limits will improve Outcome: Progressing Goal: Will remain free from infection Outcome: Progressing Goal: Diagnostic test results will improve Outcome: Progressing Goal: Respiratory complications will improve Outcome: Progressing Goal: Cardiovascular complication will  be avoided Outcome: Progressing   Problem: Activity: Goal: Risk for activity intolerance will decrease Outcome: Progressing   Problem: Nutrition: Goal: Adequate nutrition will be maintained Outcome: Progressing   Problem: Coping: Goal: Level of anxiety will decrease Outcome: Progressing   Problem: Elimination: Goal: Will not experience complications related to bowel motility Outcome: Progressing Goal: Will not experience complications related to urinary retention Outcome: Progressing   Problem: Pain Management: Goal: General experience of comfort will improve Outcome: Progressing   Problem: Safety: Goal: Ability to remain free from injury will improve Outcome: Progressing   Problem: Skin Integrity: Goal: Risk for impaired skin integrity will decrease Outcome: Progressing   Problem: Education: Goal: Knowledge of disease or condition will improve Outcome: Progressing Goal: Understanding of medication regimen will improve Outcome: Progressing Goal: Individualized Educational Video(s) Outcome: Progressing   Problem: Activity: Goal: Ability to tolerate increased activity will improve Outcome: Progressing   Problem: Cardiac: Goal: Ability to achieve and maintain adequate cardiopulmonary perfusion will improve Outcome: Progressing   Problem: Health Behavior/Discharge Planning: Goal: Ability to safely manage health-related needs after discharge will improve Outcome: Progressing

## 2023-03-16 NOTE — TOC Transition Note (Addendum)
Transition of Care [facility]) - Discharge Note   Patient Details  Name: Raymond Hays MRN: [id number] Date of Birth: Aug 04, 1945  [facility] CM/SW Contact:  Bing Quarry, RN Phone Number: 03/16/2023, 10:04 AM   Clinical Narrative:  12/15: Patient to be discharged from acute setting and transferred via [facility] back to LTC facility at [facility] today.  Confirmed with [facility] Deborah via telephone call. Call report to [phone], [address] on LTC unit. EMS forms printed to [employer] on patient's unit. Patient is a Full Code. A&O. Family was notified of pending transfer on 03/14/23, the discharge was delayed one day for medical reasons per provider. RN CM will call [facility] for transport once patient ready per Unit RN.   Gabriel Cirri MSN RN CM  Care Management Department.  [place]  [facility] Direct Dial: [phone] Main Office Phone: [phone] Weekends Only       Final next level of care: Skilled Nursing Facility [facility]) Barriers to Discharge: Barriers Resolved   Patient Goals and CMS Choice Patient states their goals for this hospitalization and ongoing recovery are:: Skilled   Choice offered to / list presented to :  (Patient returning to LTC at facility.)      Discharge Placement              Patient [facility] bed at: [facility] Patient to be transferred to facility by: [facility]   Patient and family notified of of transfer:  (rothschild,debbie (Sister)  [phone] [facility]))  Discharge Plan and Services Additional resources added to the After Visit Summary for       Post Acute Care Choice: Resumption of Svcs/PTA Provider          DME Arranged: N/A DME Agency: NA       HH Arranged: NA HH Agency: NA        Social Drivers of Health (SDOH) Interventions SDOH Screenings   Food Insecurity: No Food Insecurity (03/11/2023)  Housing: Low Risk  (03/11/2023)  Transportation Needs: No Transportation Needs (03/11/2023)  Utilities: Not At  Risk (03/11/2023)  Depression (PHQ2-9): Low Risk  (09/25/2022)  Social Connections: Unknown (11/30/2021)   Received from [facility]  Tobacco Use: Medium Risk (03/12/2023)     Readmission Risk Interventions    03/12/2023    2:19 PM 01/12/2021    1:08 PM 10/20/2020    8:59 AM  Readmission Risk Prevention Plan  Transportation Screening Complete Complete Complete  PCP or Specialist Appt within 3-5 Days Complete  Complete  HRI or Home Care Consult   Complete  Social Work Consult for Recovery Care Planning/Counseling Complete    Palliative Care Screening Not Applicable  Not Applicable  Medication Review Oceanographer) Complete Complete Referral to Pharmacy  PCP or Specialist appointment within 3-5 days of discharge  Complete   HRI or Home Care Consult  Complete   SW Recovery Care/Counseling Consult  Complete   Palliative Care Screening  Not Applicable   Skilled Nursing Facility  Complete

## 2023-03-18 ENCOUNTER — Ambulatory Visit: Payer: No Typology Code available for payment source | Admitting: Occupational Therapy

## 2023-04-04 ENCOUNTER — Encounter: Payer: Self-pay | Admitting: Occupational Therapy

## 2023-04-04 NOTE — Therapy (Signed)
 [facility] [address] [place], [place], [address] Phone: [phone]   Fax:  [phone]  Patient Details  Name: Raymond Hays MRN: [id number] Date of Birth: [birth year]   OCCUPATIONAL THERAPY DISCHARGE SUMMARY  Visits from Start of Care: 1  Current functional level related to goals / functional outcomes: Goals not met d/t pt not seen since initial outpt OT evaluation.  Remaining deficits: Pt has some remaining functional deficits or pain.   Education / Equipment: Pt not seen since initial outpt OT eval.    Patient goals were not met. Patient is being discharged due to not returning since the last visit on 02/20/23 and pt admitted to ED in December 2024 and therefore change in medical status.  A new referral will be required to resume outpt OT services.    Geofm FORBES Coder, OT 04/04/2023, 11:10 AM  [facility] [address] [place], [place], [address] Phone: [phone]   Fax:  [phone]

## 2023-04-29 ENCOUNTER — Emergency Department: Payer: No Typology Code available for payment source

## 2023-04-29 ENCOUNTER — Inpatient Hospital Stay: Payer: No Typology Code available for payment source

## 2023-04-29 ENCOUNTER — Encounter: Payer: Self-pay | Admitting: Medical Oncology

## 2023-04-29 ENCOUNTER — Inpatient Hospital Stay
Admission: EM | Admit: 2023-04-29 | Discharge: 2023-05-09 | DRG: 871 | Disposition: A | Discharge status: Skilled Nursing Facility | Payer: No Typology Code available for payment source | Source: Skilled Nursing Facility | Attending: Osteopathic Medicine | Admitting: Osteopathic Medicine

## 2023-04-29 ENCOUNTER — Other Ambulatory Visit: Payer: Self-pay

## 2023-04-29 DIAGNOSIS — J440 Chronic obstructive pulmonary disease with acute lower respiratory infection: Secondary | ICD-10-CM | POA: Diagnosis present

## 2023-04-29 DIAGNOSIS — N179 Acute kidney failure, unspecified: Secondary | ICD-10-CM | POA: Diagnosis not present

## 2023-04-29 DIAGNOSIS — G20A1 Parkinson's disease without dyskinesia, without mention of fluctuations: Secondary | ICD-10-CM | POA: Diagnosis present

## 2023-04-29 DIAGNOSIS — Z66 Do not resuscitate: Secondary | ICD-10-CM | POA: Diagnosis present

## 2023-04-29 DIAGNOSIS — G20B1 Parkinson's disease with dyskinesia, without mention of fluctuations: Secondary | ICD-10-CM | POA: Diagnosis not present

## 2023-04-29 DIAGNOSIS — Z7901 Long term (current) use of anticoagulants: Secondary | ICD-10-CM

## 2023-04-29 DIAGNOSIS — Z7951 Long term (current) use of inhaled steroids: Secondary | ICD-10-CM

## 2023-04-29 DIAGNOSIS — E669 Obesity, unspecified: Secondary | ICD-10-CM | POA: Diagnosis not present

## 2023-04-29 DIAGNOSIS — Z1152 Encounter for screening for COVID-19: Secondary | ICD-10-CM | POA: Diagnosis not present

## 2023-04-29 DIAGNOSIS — R6520 Severe sepsis without septic shock: Secondary | ICD-10-CM | POA: Diagnosis present

## 2023-04-29 DIAGNOSIS — Z7189 Other specified counseling: Secondary | ICD-10-CM

## 2023-04-29 DIAGNOSIS — E785 Hyperlipidemia, unspecified: Secondary | ICD-10-CM | POA: Diagnosis present

## 2023-04-29 DIAGNOSIS — E1140 Type 2 diabetes mellitus with diabetic neuropathy, unspecified: Secondary | ICD-10-CM

## 2023-04-29 DIAGNOSIS — Z794 Long term (current) use of insulin: Secondary | ICD-10-CM

## 2023-04-29 DIAGNOSIS — N400 Enlarged prostate without lower urinary tract symptoms: Secondary | ICD-10-CM | POA: Diagnosis present

## 2023-04-29 DIAGNOSIS — F418 Other specified anxiety disorders: Secondary | ICD-10-CM | POA: Diagnosis not present

## 2023-04-29 DIAGNOSIS — R140 Abdominal distension (gaseous): Secondary | ICD-10-CM | POA: Diagnosis present

## 2023-04-29 DIAGNOSIS — Z96643 Presence of artificial hip joint, bilateral: Secondary | ICD-10-CM | POA: Diagnosis present

## 2023-04-29 DIAGNOSIS — E119 Type 2 diabetes mellitus without complications: Secondary | ICD-10-CM | POA: Diagnosis present

## 2023-04-29 DIAGNOSIS — I5033 Acute on chronic diastolic (congestive) heart failure: Secondary | ICD-10-CM | POA: Diagnosis present

## 2023-04-29 DIAGNOSIS — J189 Pneumonia, unspecified organism: Secondary | ICD-10-CM | POA: Diagnosis present

## 2023-04-29 DIAGNOSIS — K219 Gastro-esophageal reflux disease without esophagitis: Secondary | ICD-10-CM | POA: Diagnosis present

## 2023-04-29 DIAGNOSIS — G9341 Metabolic encephalopathy: Secondary | ICD-10-CM | POA: Diagnosis present

## 2023-04-29 DIAGNOSIS — Y95 Nosocomial condition: Secondary | ICD-10-CM | POA: Diagnosis present

## 2023-04-29 DIAGNOSIS — Z6841 Body Mass Index (BMI) 40.0 and over, adult: Secondary | ICD-10-CM | POA: Diagnosis not present

## 2023-04-29 DIAGNOSIS — I700 Atherosclerosis of aorta: Secondary | ICD-10-CM | POA: Diagnosis present

## 2023-04-29 DIAGNOSIS — I480 Paroxysmal atrial fibrillation: Secondary | ICD-10-CM | POA: Diagnosis present

## 2023-04-29 DIAGNOSIS — R404 Transient alteration of awareness: Secondary | ICD-10-CM | POA: Diagnosis not present

## 2023-04-29 DIAGNOSIS — K5981 Ogilvie syndrome: Secondary | ICD-10-CM | POA: Diagnosis present

## 2023-04-29 DIAGNOSIS — J9621 Acute and chronic respiratory failure with hypoxia: Principal | ICD-10-CM | POA: Diagnosis present

## 2023-04-29 DIAGNOSIS — I10 Essential (primary) hypertension: Secondary | ICD-10-CM | POA: Diagnosis present

## 2023-04-29 DIAGNOSIS — I110 Hypertensive heart disease with heart failure: Secondary | ICD-10-CM | POA: Diagnosis present

## 2023-04-29 DIAGNOSIS — Z79890 Hormone replacement therapy (postmenopausal): Secondary | ICD-10-CM

## 2023-04-29 DIAGNOSIS — J441 Chronic obstructive pulmonary disease with (acute) exacerbation: Secondary | ICD-10-CM | POA: Diagnosis present

## 2023-04-29 DIAGNOSIS — Z87891 Personal history of nicotine dependence: Secondary | ICD-10-CM

## 2023-04-29 DIAGNOSIS — A419 Sepsis, unspecified organism: Secondary | ICD-10-CM | POA: Diagnosis present

## 2023-04-29 DIAGNOSIS — Z9981 Dependence on supplemental oxygen: Secondary | ICD-10-CM

## 2023-04-29 DIAGNOSIS — N319 Neuromuscular dysfunction of bladder, unspecified: Secondary | ICD-10-CM | POA: Diagnosis present

## 2023-04-29 DIAGNOSIS — E039 Hypothyroidism, unspecified: Secondary | ICD-10-CM | POA: Diagnosis present

## 2023-04-29 DIAGNOSIS — K567 Ileus, unspecified: Secondary | ICD-10-CM | POA: Diagnosis present

## 2023-04-29 DIAGNOSIS — L8990 Pressure ulcer of unspecified site, unspecified stage: Secondary | ICD-10-CM | POA: Diagnosis present

## 2023-04-29 DIAGNOSIS — G9340 Encephalopathy, unspecified: Secondary | ICD-10-CM | POA: Diagnosis not present

## 2023-04-29 DIAGNOSIS — I4891 Unspecified atrial fibrillation: Secondary | ICD-10-CM

## 2023-04-29 DIAGNOSIS — E66813 Obesity, class 3: Secondary | ICD-10-CM | POA: Diagnosis present

## 2023-04-29 DIAGNOSIS — Z993 Dependence on wheelchair: Secondary | ICD-10-CM

## 2023-04-29 DIAGNOSIS — Z79899 Other long term (current) drug therapy: Secondary | ICD-10-CM

## 2023-04-29 LAB — BASIC METABOLIC PANEL
Anion gap: 18 — ABNORMAL HIGH (ref 5–15)
BUN: 29 mg/dL — ABNORMAL HIGH (ref 8–23)
CO2: 21 mmol/L — ABNORMAL LOW (ref 22–32)
Calcium: 9.6 mg/dL (ref 8.9–10.3)
Chloride: 99 mmol/L (ref 98–111)
Creatinine, Ser: 0.93 mg/dL (ref 0.61–1.24)
GFR, Estimated: >60 mL/min (ref >60)
Glucose, Bld: 131 mg/dL — ABNORMAL HIGH (ref 70–99)
Potassium: 4.5 mmol/L (ref 3.5–5.1)
Sodium: 138 mmol/L (ref 135–145)

## 2023-04-29 LAB — BLOOD GAS, VENOUS
Acid-base deficit: 2 mmol/L (ref 0.0–2.0)
Bicarbonate: 22.3 mmol/L (ref 20.0–28.0)
Delivery systems: POSITIVE
FIO2: 40 %
O2 Saturation: 93.5 %
Patient temperature: 37
pCO2, Ven: 36 mm[Hg] — ABNORMAL LOW (ref 44–60)
pH, Ven: 7.4 (ref 7.25–7.43)
pO2, Ven: 64 mm[Hg] — ABNORMAL HIGH (ref 32–45)

## 2023-04-29 LAB — RESP PANEL BY RT-PCR (RSV, FLU A&B, COVID)  RVPGX2
Influenza A by PCR: NEGATIVE
Influenza B by PCR: NEGATIVE
Resp Syncytial Virus by PCR: NEGATIVE
SARS Coronavirus 2 by RT PCR: NEGATIVE

## 2023-04-29 LAB — LACTIC ACID, PLASMA
Lactic Acid, Venous: 2 mmol/L (ref 0.5–1.9)
Lactic Acid, Venous: 2.7 mmol/L (ref 0.5–1.9)

## 2023-04-29 LAB — PROCALCITONIN: Procalcitonin: 0.3 ng/mL

## 2023-04-29 LAB — CBC
HCT: 41.8 % (ref 39.0–52.0)
Hemoglobin: 13.6 g/dL (ref 13.0–17.0)
MCH: 29.7 pg (ref 26.0–34.0)
MCHC: 32.5 g/dL (ref 30.0–36.0)
MCV: 91.3 fL (ref 80.0–100.0)
Platelets: 364 10*3/uL (ref 150–400)
RBC: 4.58 MIL/uL (ref 4.22–5.81)
RDW: 17.9 % — ABNORMAL HIGH (ref 11.5–15.5)
WBC: 17.2 10*3/uL — ABNORMAL HIGH (ref 4.0–10.5)
nRBC: 0 % (ref 0.0–0.2)

## 2023-04-29 LAB — TROPONIN I (HIGH SENSITIVITY): Troponin I (High Sensitivity): 17 ng/L (ref <18)

## 2023-04-29 LAB — BRAIN NATRIURETIC PEPTIDE: B Natriuretic Peptide: 152.3 pg/mL — ABNORMAL HIGH (ref 0.0–100.0)

## 2023-04-29 MED ORDER — HYDRALAZINE HCL 20 MG/ML IJ SOLN
5.0000 mg | INTRAMUSCULAR | Status: DC | PRN
  Administered 2023-05-03: 5 mg via INTRAVENOUS
  Filled 2023-04-29: qty 1

## 2023-04-29 MED ORDER — ALBUTEROL SULFATE (2.5 MG/3ML) 0.083% IN NEBU
2.5000 mg | INHALATION_SOLUTION | RESPIRATORY_TRACT | Status: DC | PRN
  Administered 2023-05-01: 2.5 mg via RESPIRATORY_TRACT
  Filled 2023-04-29 (×2): qty 3

## 2023-04-29 MED ORDER — IPRATROPIUM-ALBUTEROL 0.5-2.5 (3) MG/3ML IN SOLN
3.0000 mL | Freq: Once | RESPIRATORY_TRACT | Status: AC
Start: 2023-04-29 — End: 2023-04-29
  Administered 2023-04-29: 3 mL via RESPIRATORY_TRACT
  Filled 2023-04-29: qty 6

## 2023-04-29 MED ORDER — ONDANSETRON HCL 4 MG/2ML IJ SOLN: 4.0000 mg | Freq: Three times a day (TID) | INTRAMUSCULAR | Status: DC | PRN

## 2023-04-29 MED ORDER — ACETAMINOPHEN 325 MG PO TABS: 650.0000 mg | ORAL_TABLET | Freq: Four times a day (QID) | ORAL | Status: DC | PRN

## 2023-04-29 MED ORDER — SODIUM CHLORIDE 0.9 % IV SOLN
500.0000 mg | Freq: Once | INTRAVENOUS | Status: AC
  Administered 2023-04-29: 500 mg via INTRAVENOUS
  Filled 2023-04-29: qty 5

## 2023-04-29 MED ORDER — IPRATROPIUM-ALBUTEROL 0.5-2.5 (3) MG/3ML IN SOLN
3.0000 mL | Freq: Once | RESPIRATORY_TRACT | Status: AC
  Administered 2023-04-29: 3 mL via RESPIRATORY_TRACT

## 2023-04-29 MED ORDER — FUROSEMIDE 10 MG/ML IJ SOLN
40.0000 mg | Freq: Once | INTRAMUSCULAR | Status: AC
Start: 2023-04-29 — End: 2023-04-29
  Administered 2023-04-29: 40 mg via INTRAVENOUS
  Filled 2023-04-29: qty 4

## 2023-04-29 MED ORDER — DILTIAZEM HCL 25 MG/5ML IV SOLN
10.0000 mg | Freq: Once | INTRAVENOUS | Status: AC
  Administered 2023-04-29: 10 mg via INTRAVENOUS
  Filled 2023-04-29: qty 5

## 2023-04-29 MED ORDER — IOHEXOL 300 MG/ML  SOLN
100.0000 mL | Freq: Once | INTRAMUSCULAR | Status: AC | PRN
  Administered 2023-04-29: 100 mL via INTRAVENOUS

## 2023-04-29 MED ORDER — DILTIAZEM HCL-DEXTROSE 125-5 MG/125ML-% IV SOLN (PREMIX)
5.0000 mg/h | INTRAVENOUS | Status: DC
  Administered 2023-04-29: 5 mg/h via INTRAVENOUS
  Administered 2023-04-30 – 2023-05-02 (×6): 15 mg/h via INTRAVENOUS
  Filled 2023-04-29 (×9): qty 125

## 2023-04-29 MED ORDER — FUROSEMIDE 10 MG/ML IJ SOLN
40.0000 mg | Freq: Two times a day (BID) | INTRAMUSCULAR | Status: DC
  Administered 2023-04-30: 40 mg via INTRAVENOUS
  Filled 2023-04-29: qty 4

## 2023-04-29 MED ORDER — DM-GUAIFENESIN ER 30-600 MG PO TB12
1.0000 | ORAL_TABLET | Freq: Two times a day (BID) | ORAL | Status: DC | PRN
  Administered 2023-05-02 – 2023-05-04 (×3): 1 via ORAL
  Filled 2023-04-29 (×4): qty 1

## 2023-04-29 MED ORDER — METOPROLOL TARTRATE 5 MG/5ML IV SOLN
5.0000 mg | Freq: Once | INTRAVENOUS | Status: AC
  Administered 2023-04-29: 5 mg via INTRAVENOUS
  Filled 2023-04-29: qty 5

## 2023-04-29 MED ORDER — SODIUM CHLORIDE 0.9 % IV SOLN
2.0000 g | Freq: Once | INTRAVENOUS | Status: AC
  Administered 2023-04-29: 2 g via INTRAVENOUS
  Filled 2023-04-29: qty 20

## 2023-04-29 MED ORDER — IPRATROPIUM-ALBUTEROL 0.5-2.5 (3) MG/3ML IN SOLN
3.0000 mL | RESPIRATORY_TRACT | Status: DC
  Administered 2023-04-29 – 2023-05-02 (×14): 3 mL via RESPIRATORY_TRACT
  Filled 2023-04-29 (×14): qty 3

## 2023-04-29 MED ORDER — CARBIDOPA-LEVODOPA 25-100 MG PO TABS
1.0000 | ORAL_TABLET | Freq: Three times a day (TID) | ORAL | Status: DC
  Administered 2023-05-01 – 2023-05-09 (×26): 1 via ORAL
  Filled 2023-04-29 (×30): qty 1

## 2023-04-29 NOTE — ED Provider Notes (Signed)
[facility] Provider Note    Event Date/Time   First MD Initiated Contact with Patient 04/29/23 1501     (approximate)   History   Shortness of Breath   HPI  HPI limited due to respiratory distress, BiPAP  Raymond Hays is a 78 y.o. male with a history of paroxysmal A-fib, COPD on 2 L nasal cannula with exertion, type 2 diabetes, hypertension, dyslipidemia, and osteoarthritis who presents with shortness of breath, gradual onset over the last few days, associated with leg swelling and cough.  The patient denies any fevers or chest pain.  I reviewed the medical records.  The patient was most recent admitted to the hospital service in December after presenting in atrial fibrillation with RVR and also being diagnosed with a COPD exacerbation.   Physical Exam   Triage Vital Signs: ED Triage Vitals  Encounter Vitals Group     BP 04/29/23 1456 (!) 154/94     Systolic BP Percentile --      Diastolic BP Percentile --      Pulse Rate 04/29/23 1456 (!) 107     Resp 04/29/23 1456 (!) 24     Temp --      Temp src --      SpO2 04/29/23 1456 100 %     Weight 04/29/23 1457 220 lb 7.4 oz (100 kg)     Height 04/29/23 1457 5\' 7"  (1.702 m)     Head Circumference --      Peak Flow --      Pain Score 04/29/23 1457 0     Pain Loc --      Pain Education --      Exclude from Growth Chart --     Most recent vital signs: Vitals:   04/29/23 1535 04/29/23 1800  BP:  (!) 137/125  Pulse:  (!) 148  Resp:  (!) 28  Temp: 99.1 F (37.3 C)   SpO2:  99%     General: Alert, uncomfortable appearing but in no distress.  CV:  Good peripheral perfusion.  Resp:  Increased respiratory effort, speaking in short sentences.  Rales and rhonchi bilaterally. Abd:  No distention.  Other:  2+ bilateral lower extremity edema.   ED Results / Procedures / Treatments   Labs (all labs ordered are listed, but only abnormal results are displayed) Labs Reviewed  BASIC METABOLIC PANEL  - Abnormal; Notable for the following components:      Result Value   CO2 21 (*)    Glucose, Bld 131 (*)    BUN 29 (*)    Anion gap 18 (*)    All other components within normal limits  CBC - Abnormal; Notable for the following components:   WBC 17.2 (*)    RDW 17.9 (*)    All other components within normal limits  BRAIN NATRIURETIC PEPTIDE - Abnormal; Notable for the following components:   B Natriuretic Peptide 152.3 (*)    All other components within normal limits  BLOOD GAS, VENOUS - Abnormal; Notable for the following components:   pCO2, Ven 36 (*)    pO2, Ven 64 (*)    All other components within normal limits  LACTIC ACID, PLASMA - Abnormal; Notable for the following components:   Lactic Acid, Venous 2.0 (*)    All other components within normal limits  RESP PANEL BY RT-PCR (RSV, FLU A&B, COVID)  RVPGX2  CULTURE, BLOOD (ROUTINE X 2)  CULTURE, BLOOD (ROUTINE X 2)  PROCALCITONIN  LACTIC ACID, PLASMA  TROPONIN I (HIGH SENSITIVITY)     EKG  ED ECG REPORT I, Dionne Bucy, the attending physician, personally viewed and interpreted this ECG.  Date: 04/29/2023 EKG Time:  Rate:  Rhythm: Atrial fibrillation with RVR      RADIOLOGY  Chest x-ray: I independently viewed and interpreted the images; there are bibasilar opacities, right greater than left.   PROCEDURES:  Critical Care performed: Yes, see critical care procedure note(s)  .Critical Care  Performed by: Dionne Bucy, MD Authorized by: Dionne Bucy, MD   Critical care provider statement:    Critical care time (minutes):  30   Critical care time was exclusive of:  Separately billable procedures and treating other patients   Critical care was necessary to treat or prevent imminent or life-threatening deterioration of the following conditions:  Respiratory failure   Critical care was time spent personally by me on the following activities:  Development of treatment plan with patient or  surrogate, discussions with consultants, evaluation of patient's response to treatment, examination of patient, ordering and review of laboratory studies, ordering and review of radiographic studies, ordering and performing treatments and interventions, pulse oximetry, re-evaluation of patient's condition, review of old charts and obtaining history from patient or surrogate   Care discussed with: admitting provider      MEDICATIONS ORDERED IN ED: Medications  azithromycin (ZITHROMAX) 500 mg in sodium chloride 0.9 % 250 mL IVPB (500 mg Intravenous New Bag/Given 04/29/23 1808)  diltiazem (CARDIZEM) 125 mg in dextrose 5% 125 mL (1 mg/mL) infusion (5 mg/hr Intravenous New Bag/Given 04/29/23 1809)  carbidopa-levodopa (SINEMET IR) 25-100 MG per tablet immediate release 1 tablet (has no administration in time range)  ipratropium-albuterol (DUONEB) 0.5-2.5 (3) MG/3ML nebulizer solution 3 mL (has no administration in time range)  albuterol (PROVENTIL) (2.5 MG/3ML) 0.083% nebulizer solution 2.5 mg (has no administration in time range)  dextromethorphan-guaiFENesin (MUCINEX DM) 30-600 MG per 12 hr tablet 1 tablet (has no administration in time range)  ondansetron (ZOFRAN) injection 4 mg (has no administration in time range)  hydrALAZINE (APRESOLINE) injection 5 mg (has no administration in time range)  acetaminophen (TYLENOL) tablet 650 mg (has no administration in time range)  ipratropium-albuterol (DUONEB) 0.5-2.5 (3) MG/3ML nebulizer solution 3 mL (3 mLs Nebulization Given 04/29/23 1530)  ipratropium-albuterol (DUONEB) 0.5-2.5 (3) MG/3ML nebulizer solution 3 mL (3 mLs Nebulization Given 04/29/23 1529)  furosemide (LASIX) injection 40 mg (40 mg Intravenous Given 04/29/23 1534)  cefTRIAXone (ROCEPHIN) 2 g in sodium chloride 0.9 % 100 mL IVPB (0 g Intravenous Stopped 04/29/23 1754)  diltiazem (CARDIZEM) injection 10 mg (10 mg Intravenous Given 04/29/23 1657)     IMPRESSION / MDM / ASSESSMENT AND PLAN / ED  COURSE  I reviewed the triage vital signs and the nursing notes.  78 year old male with PMH as noted above presents with worsening shortness of breath and cough.  On exam the patient is uncomfortable appearing with increased work of breathing.  Lung exam reveals diffuse rales and rhonchi.  He has significant peripheral edema.  His temperature is borderline elevated and he is tachycardic to the 140s and atrial fibrillation with RVR.  Differential diagnosis includes, but is not limited to, CHF exacerbation, COPD exacerbation, acute bronchitis, pneumonia, COVID or other viral infection.  I have ordered IV Lasix since the patient does appear fluid overloaded, as well as DuoNebs for possible COPD exacerbation.  We will obtain chest x-ray, lab workup, and reassess.  Patient's presentation is most consistent with  acute presentation with potential threat to life or bodily function.  The patient is on the cardiac monitor to evaluate for evidence of arrhythmia and/or significant heart rate changes.  ----------------------------------------- 6:38 PM on 04/29/2023 -----------------------------------------  Chest x-ray shows basal cell opacities primarily on the right.  Respiratory panel is negative.  Procalcitonin is low.  Lactate is only minimally elevated.  BNP is also only minimally elevated.  The patient states he is feeling relatively well on the BiPAP.  The RN pointed out to me that his abdomen looks somewhat distended and tense.  However, the patient has no abdominal pain.  He is not tender on exam.  He is holding his abdomen flexed during his breath, however when he relaxes the abdomen it is soft to palpation.  The patient remained in atrial fibrillation with RVR despite a dose of Cardizem.  I started him on a Cardizem infusion.  I consulted Dr. Clyde Lundborg from the hospitalist service; based on our discussion he agrees to evaluate the patient for admission.  FINAL CLINICAL IMPRESSION(S) / ED DIAGNOSES    Final diagnoses:  Acute on chronic respiratory failure with hypoxia (HCC)  Atrial fibrillation with RVR (HCC)     Rx / DC Orders   ED Discharge Orders     None        Note:  This document was prepared using Dragon voice recognition software and may include unintentional dictation errors.    Dionne Bucy, MD 04/29/23 [id number]

## 2023-04-29 NOTE — H&P (Signed)
History and Physical    Raymond Hays UJW:[id number] DOB: 1945/04/09 DOA: 04/29/2023  Referring MD/NP/PA:   PCP: Pearson Grippe, MD   Patient coming from:  The patient is coming from home.     Chief Complaint:   HPI: Raymond Hays is a 78 y.o. male with medical history significant of      Data reviewed independently and ED Course: pt was found to have     ***       EKG: I have personally reviewed.  Not done in ED, will get one.   ***   Review of Systems:   General: no fevers, chills, no body weight gain, has poor appetite, has fatigue HEENT: no blurry vision, hearing changes or sore throat Respiratory: no dyspnea, coughing, wheezing CV: no chest pain, no palpitations GI: no nausea, vomiting, abdominal pain, diarrhea, constipation GU: no dysuria, burning on urination, increased urinary frequency, hematuria  Ext: no leg edema Neuro: no unilateral weakness, numbness, or tingling, no vision change or hearing loss Skin: no rash, no skin tear. MSK: No muscle spasm, no deformity, no limitation of range of movement in spin Heme: No easy bruising.  Travel history: No recent long distant travel.   Allergy: No Known Allergies  Past Medical History:  Diagnosis Date   Allergic rhinitis    Anxiety    Arthritis    Atrial fibrillation (HCC)    Chronic indwelling Foley catheter    COPD (chronic obstructive pulmonary disease) (HCC)    Cough    Diabetes (HCC)    Essential (primary) hypertension    Hemorrhoid    Hyperlipidemia    PAF (paroxysmal atrial fibrillation) (HCC)    Primary osteoarthritis, unspecified shoulder    Retention of urine, unspecified    Spinal stenosis    UTI (urinary tract infection)     Past Surgical History:  Procedure Laterality Date   APPENDECTOMY     BACK SURGERY     COLONOSCOPY N/A 10/27/2020   Procedure: COLONOSCOPY;  Surgeon: Wyline Mood, MD;  Location: [facility] ENDOSCOPY;  Service: Gastroenterology;  Laterality: N/A;   ELBOW SURGERY      ESOPHAGOGASTRODUODENOSCOPY (EGD) WITH PROPOFOL N/A 10/27/2020   Procedure: ESOPHAGOGASTRODUODENOSCOPY (EGD) WITH PROPOFOL;  Surgeon: Wyline Mood, MD;  Location: [facility] ENDOSCOPY;  Service: Gastroenterology;  Laterality: N/A;   HERNIA REPAIR     KNEE SURGERY     neck     PARTIAL HIP ARTHROPLASTY Bilateral    TEE WITHOUT CARDIOVERSION N/A 10/19/2020   Procedure: TRANSESOPHAGEAL ECHOCARDIOGRAM (TEE);  Surgeon: Antonieta Iba, MD;  Location: [facility] ORS;  Service: Cardiovascular;  Laterality: N/A;   TONSILLECTOMY      Social History:  reports that he quit smoking about 19 years ago. His smoking use included cigarettes. He started smoking about 29 years ago. He has a 5 pack-year smoking history. He has never used smokeless tobacco. He reports that he does not currently use alcohol. He reports that he does not currently use drugs after having used the following drugs: "Crack" cocaine.  Family History:  Family History  Problem Relation Age of Onset   Healthy Son    Healthy Son      Prior to Admission medications   Medication Sig Start Date End Date Taking? Authorizing Provider  acetic acid 0.25 % irrigation Irrigate with 1 Application as directed every 12 (twelve) hours. (Flush foley catheter)    [provider]  Albuterol Sulfate (PROAIR RESPICLICK) 108 (90 Base) MCG/ACT AEPB Inhale 2 puffs into the lungs  every 4 (four) hours as needed (COPD).    [provider]  allopurinol (ZYLOPRIM) 100 MG tablet Take 200 mg by mouth daily.    [provider]  apixaban (ELIQUIS) 5 MG TABS tablet Take 5 mg by mouth 2 (two) times daily.     [provider]  ascorbic acid (VITAMIN C) 500 MG tablet Take 500 mg by mouth daily.    [provider]  atorvastatin (LIPITOR) 20 MG tablet Take 20 mg by mouth at bedtime.    [provider]  Calcium Carbonate (CALCIUM 500 PO) Take 2 tablets by mouth every 6 (six) hours as needed (indigestion/heartburn).    [provider]  carbidopa-levodopa (SINEMET IR) 25-100 MG tablet Take 1 tablet by mouth 3 (three) times daily before meals.    [provider]  cetirizine (ZYRTEC) 5 MG tablet Take 5 mg by mouth at bedtime.    [provider]  Cholecalciferol (VITAMIN D-3) 25 MCG (1000 UT) CAPS Take 1,000 Units by mouth daily.    [provider]  citalopram (CELEXA) 10 MG tablet Take 10 mg by mouth at bedtime.    [provider]  diltiazem (CARDIZEM CD) 240 MG 24 hr capsule Take 1 capsule (240 mg total) by mouth daily. 03/17/23   Shalhoub, Deno Lunger, MD  docusate sodium (COLACE) 100 MG capsule Take 100 mg by mouth daily.    [provider]  Emollient (CETAPHIL) cream Apply 1 Application topically 2 (two) times daily as needed.    [provider]  Eyelid Cleansers (OCUSOFT LID SCRUB ORIGINAL) PADS Place 1 Application into both eyes daily. Wipe both eyelids once per day.    [provider]  famotidine (PEPCID) 20 MG tablet Take 20 mg by mouth at bedtime.    [provider]  ferrous sulfate 324 (65 Fe) MG TBEC Take 1 tablet by mouth daily. 12/05/22   [provider]  fluticasone (FLONASE) 50 MCG/ACT nasal spray Place 2 sprays into both nostrils daily.    [provider]  Fluticasone-Umeclidin-Vilant 100-62.5-25 MCG/ACT AEPB Inhale 1 puff into the lungs daily.    [provider]  furosemide (LASIX) 20 MG tablet Take 20 mg by mouth daily as needed for edema. 10/21/22   [provider]  gabapentin (NEURONTIN) 800 MG tablet Take 800 mg by mouth 4 (four) times daily.    [provider]  guaifenesin (ROBITUSSIN) 100 MG/5ML syrup Take 200 mg by mouth every 4 (four) hours as needed for cough.    [provider]  hydrocortisone (ANUSOL-HC) 25 MG suppository Place 25 mg rectally every 4 (four) hours as needed for hemorrhoids or anal itching.    [provider]  hydrOXYzine (ATARAX/VISTARIL) 25 MG  tablet Take 25 mg by mouth every 6 (six) hours as needed for itching. 02/17/20   [provider]  ipratropium (ATROVENT) 0.06 % nasal spray Place 2 sprays into both nostrils 3 (three) times daily. 11/04/22   [provider]  ipratropium-albuterol (DUONEB) 0.5-2.5 (3) MG/3ML SOLN Take 3 mLs by nebulization every 6 (six) hours.    [provider]  lactase (LACTAID) 3000 units tablet Take 3,000 Units by mouth 3 (three) times daily with meals.    [provider]  levothyroxine (SYNTHROID) 75 MCG tablet Take 75 mcg by mouth daily before breakfast.    [provider]  lidocaine 4 % Place 1 patch onto the skin daily as needed (pain). (Remove after 12 hours)    [provider]  melatonin 5 MG TABS Take 10 mg by mouth at bedtime.    [provider]  Menthol, Topical Analgesic, (BIOFREEZE) 4 % GEL Apply 1 application  topically 3 (three) times daily. (Apply to neck)    [provider]  methocarbamol (ROBAXIN) 500 MG tablet Take 1,000 mg by mouth every 12 (twelve) hours. At 1400    [provider]  nortriptyline (PAMELOR) 10 MG capsule Take 20 mg by mouth at bedtime. 12/05/22   [provider]  oxybutynin (DITROPAN) 5 MG tablet Take 5 mg by mouth 2 (two) times daily.    [provider]  polyethylene glycol (MIRALAX / GLYCOLAX) 17 g packet Take 17 g by mouth daily.    [provider]  pramoxine (SARNA SENSITIVE) 1 % LOTN Apply 1 Application topically 4 (four) times daily as needed.    [provider]  Probiotic Product (ACIDOPHILUS PROBIOTIC BLEND) CAPS Take 1 capsule by mouth daily. 12/05/22   [provider]  Propylene Glycol 0.6 % SOLN Place 2 drops into both eyes in the morning, at noon, and at bedtime.    [provider]  psyllium (METAMUCIL SMOOTH TEXTURE) 58.6 % powder Take 1 packet by mouth 3 (three) times daily. 09/29/22   Chesley Noon, MD  selenium sulfide (SELSUN) 1 % LOTN  Apply 1 Application topically 2 (two) times a week. (Tuesday and Friday)    [provider]  senna (SENOKOT) 8.6 MG TABS tablet Take 1 tablet (8.6 mg total) by mouth daily as needed for mild constipation. Patient taking differently: Take 1 tablet by mouth 2 (two) times daily as needed for mild constipation. 09/29/22   Chesley Noon, MD  senna (SENOKOT) 8.6 MG TABS tablet Take 2 tablets (17.2 mg total) by mouth at bedtime. 03/16/23   Shalhoub, Deno Lunger, MD  silver sulfADIAZINE (SILVADENE) 1 % cream Apply 1 Application topically daily. (Apply to arms and face)    [provider]  sodium chloride (OCEAN) 0.65 % SOLN nasal spray Place 2 sprays into both nostrils every 6 (six) hours as needed for congestion.    [provider]  tacrolimus (PROTOPIC) 0.03 % ointment Apply 1 application  topically daily. (Apply to eyelids)    [provider]  terbinafine (LAMISIL) 1 % cream Apply 1 Application topically at bedtime. (Apply to feet)    [provider]  tiZANidine (ZANAFLEX) 2 MG tablet Take 2 mg by mouth 2 (two) times daily as needed for muscle spasms.    [provider]  triamcinolone cream (KENALOG) 0.1 % Apply 1 Application topically daily as needed. Apply to arms, legs, and back r/t rash    [provider]    Physical Exam: Vitals:   04/29/23 1456 04/29/23 1457 04/29/23 1535 04/29/23 1800  BP: (!) 154/94   (!) 137/125  Pulse: (!) 107   (!) 148  Resp: (!) 24   (!) 28  Temp:   99.1 F (37.3 C)   TempSrc:   Oral   SpO2: 100%   99%  Weight:  100 kg    Height:  5\' 7"  (1.702 m)     General: Not in acute distress HEENT:       Eyes: PERRL, EOMI, no jaundice       ENT: No discharge from the ears and nose, no pharynx injection, no tonsillar enlargement.        Neck: No JVD, no bruit, no mass felt. Heme: No neck lymph node enlargement. Cardiac:  S1/S2, RRR, No murmurs, No gallops or rubs. Respiratory: No rales, wheezing, rhonchi or  rubs. GI: Soft, nondistended, nontender, no rebound pain, no organomegaly, BS present. GU: No hematuria Ext: No pitting leg edema bilaterally. 1+DP/PT pulse bilaterally. Musculoskeletal: No joint deformities, No joint redness or warmth, no limitation of ROM in spin. Skin: No rashes.  Neuro: Alert, oriented X3, cranial nerves II-XII grossly intact, moves all extremities normally. Muscle strength 5/5 in all extremities, sensation to light touch intact. Brachial reflex 2+ bilaterally. Knee reflex 1+ bilaterally. Negative Babinski's sign. Normal finger to nose test. Psych: Patient is not psychotic, no suicidal or hemocidal ideation.  Labs on Admission: I have personally reviewed following labs and imaging studies  CBC: Recent Labs  Lab 04/29/23 1503  WBC 17.2*  HGB 13.6  HCT 41.8  MCV 91.3  PLT 364   Basic Metabolic Panel: Recent Labs  Lab 04/29/23 1503  NA 138  K 4.5  CL 99  CO2 21*  GLUCOSE 131*  BUN 29*  CREATININE 0.93  CALCIUM 9.6   GFR: Estimated Creatinine Clearance: 72.6 mL/min (by C-G formula based on SCr of 0.93 mg/dL). Liver Function Tests: No results for input(s): "AST", "ALT", "ALKPHOS", "BILITOT", "PROT", "ALBUMIN" in the last 168 hours. No results for input(s): "LIPASE", "AMYLASE" in the last 168 hours. No results for input(s): "AMMONIA" in the last 168 hours. Coagulation Profile: No results for input(s): "INR", "PROTIME" in the last 168 hours. Cardiac Enzymes: No results for input(s): "CKTOTAL", "CKMB", "CKMBINDEX", "TROPONINI" in the last 168 hours. BNP (last 3 results) No results for input(s): "PROBNP" in the last 8760 hours. HbA1C: No results for input(s): "HGBA1C" in the last 72 hours. CBG: No results for input(s): "GLUCAP" in the last 168 hours. Lipid Profile: No results for input(s): "CHOL", "HDL", "LDLCALC", "TRIG", "CHOLHDL", "LDLDIRECT" in the last 72 hours. Thyroid Function Tests: No results for input(s): "TSH", "T4TOTAL", "FREET4", "T3FREE",  "THYROIDAB" in the last 72 hours. Anemia Panel: No results for input(s): "VITAMINB12", "FOLATE", "FERRITIN", "TIBC", "IRON", "RETICCTPCT" in the last 72 hours. Urine analysis:    Component Value Date/Time   COLORURINE YELLOW (A) 09/14/2022 1228   APPEARANCEUR CLOUDY (A) 09/14/2022 1228   LABSPEC 1.017 09/14/2022 1228   PHURINE 8.0 09/14/2022 1228   GLUCOSEU NEGATIVE 09/14/2022 1228   HGBUR SMALL (A) 09/14/2022 1228   BILIRUBINUR NEGATIVE 09/14/2022 1228   KETONESUR NEGATIVE 09/14/2022 1228   PROTEINUR 100 (A) 09/14/2022 1228   NITRITE NEGATIVE 09/14/2022 1228   LEUKOCYTESUR LARGE (A) 09/14/2022 1228   Sepsis Labs: @LABRCNTIP (procalcitonin:4,lacticidven:4) ) Recent Results (from the past 240 hours)  Resp panel by RT-PCR (RSV, Flu A&B, Covid) Anterior Nasal Swab     Status: None   Collection Time: 04/29/23  3:28 PM   Specimen: Anterior Nasal Swab  Result Value Ref Range Status   SARS Coronavirus 2 by RT PCR NEGATIVE NEGATIVE Final    Comment: (NOTE) SARS-CoV-2 target nucleic acids are NOT DETECTED.  The SARS-CoV-2 RNA is generally detectable in upper respiratory specimens during the acute phase of infection. The lowest concentration of SARS-CoV-2 viral copies this assay can detect is 138 copies/mL. A negative result does not preclude SARS-Cov-2 infection and should not be used as the sole basis for treatment or other patient management decisions. A negative result may occur with  improper specimen collection/handling, submission of specimen other than nasopharyngeal swab, presence of viral mutation(s) within the areas targeted by this assay, and inadequate number of viral copies(<138 copies/mL). A negative result must be combined  with clinical observations, patient history, and epidemiological information. The expected result is Negative.  Fact Sheet for Patients:  BloggerCourse.com  Fact Sheet for Healthcare Providers:   SeriousBroker.it  This test is no t yet approved or cleared by the [place] FDA and  has been authorized for detection and/or diagnosis of SARS-CoV-2 by FDA under an Emergency Use Authorization (EUA). This EUA will remain  in effect (meaning this test can be used) for the duration of the COVID-19 declaration under Section 564(b)(1) of the Act, 21 U.S.C.section 360bbb-3(b)(1), unless the authorization is terminated  or revoked sooner.       Influenza A by PCR NEGATIVE NEGATIVE Final   Influenza B by PCR NEGATIVE NEGATIVE Final    Comment: (NOTE) The Xpert Xpress SARS-CoV-2/FLU/RSV plus assay is intended as an aid in the diagnosis of influenza from Nasopharyngeal swab specimens and should not be used as a sole basis for treatment. Nasal washings and aspirates are unacceptable for Xpert Xpress SARS-CoV-2/FLU/RSV testing.  Fact Sheet for Patients: BloggerCourse.com  Fact Sheet for Healthcare Providers: SeriousBroker.it  This test is not yet approved or cleared by the [place] FDA and has been authorized for detection and/or diagnosis of SARS-CoV-2 by FDA under an Emergency Use Authorization (EUA). This EUA will remain in effect (meaning this test can be used) for the duration of the COVID-19 declaration under Section 564(b)(1) of the Act, 21 U.S.C. section 360bbb-3(b)(1), unless the authorization is terminated or revoked.     Resp Syncytial Virus by PCR NEGATIVE NEGATIVE Final    Comment: (NOTE) Fact Sheet for Patients: BloggerCourse.com  Fact Sheet for Healthcare Providers: SeriousBroker.it  This test is not yet approved or cleared by the [place] FDA and has been authorized for detection and/or diagnosis of SARS-CoV-2 by FDA under an Emergency Use Authorization (EUA). This EUA will remain in effect (meaning this test can be used) for  the duration of the COVID-19 declaration under Section 564(b)(1) of the Act, 21 U.S.C. section 360bbb-3(b)(1), unless the authorization is terminated or revoked.  Performed at [facility], [address], [place], [place] [address]      Radiological Exams on Admission:   Assessment/Plan Principal Problem:   Acute on chronic diastolic CHF (congestive heart failure) (HCC) Active Problems:   Acute on chronic respiratory failure with hypoxia (HCC)   Sepsis (HCC)   Paroxysmal atrial fibrillation with rapid ventricular response (HCC)   COPD exacerbation (HCC)   Essential hypertension   Parkinson disease (HCC)   Hypothyroidism   HLD (hyperlipidemia)   Depression with anxiety   Obesity (BMI 30-39.9)   Abdominal distension   Assessment and Plan:   Principal Problem:   Acute on chronic diastolic CHF (congestive heart failure) (HCC) Active Problems:   Acute on chronic respiratory failure with hypoxia (HCC)   Sepsis (HCC)   Paroxysmal atrial fibrillation with rapid ventricular response (HCC)   COPD exacerbation (HCC)   Essential hypertension   Parkinson disease (HCC)   Hypothyroidism   HLD (hyperlipidemia)   Depression with anxiety   Obesity (BMI 30-39.9)   Abdominal distension    DVT ppx: Eliquis  Code Status: Full code   ***  Family Communication:     not done, no family member is at bed side.              Yes, patient's    at bed side.       by phone   ***  Disposition Plan:  Anticipate discharge back to previous  environment  Consults called:    Admission status and Level of care: Progressive:    for obs as inpt        Dispo: The patient is from: {From:23814}              Anticipated d/c is to: {To:23815}              Anticipated d/c date is: {Days:23816}              Patient currently {Medically stable:23817}    Severity of Illness:  {Observation/Inpatient:21159}       Date of Service 04/29/2023    Lorretta Harp [facility]   If 7PM-7AM, please contact night-coverage [email or web address] 04/29/2023, 6:53 PM

## 2023-04-29 NOTE — ED Triage Notes (Signed)
Pt from [facility] via ems with reports of sob x 2 days. Initial sat 90% on RA but work of breathing noted, pt was placed on cpap pta, placed on bipap on arrival to ED.

## 2023-04-30 ENCOUNTER — Inpatient Hospital Stay: Payer: No Typology Code available for payment source

## 2023-04-30 DIAGNOSIS — I10 Essential (primary) hypertension: Secondary | ICD-10-CM

## 2023-04-30 DIAGNOSIS — R140 Abdominal distension (gaseous): Secondary | ICD-10-CM

## 2023-04-30 DIAGNOSIS — J441 Chronic obstructive pulmonary disease with (acute) exacerbation: Secondary | ICD-10-CM

## 2023-04-30 DIAGNOSIS — G9340 Encephalopathy, unspecified: Secondary | ICD-10-CM

## 2023-04-30 DIAGNOSIS — G9341 Metabolic encephalopathy: Secondary | ICD-10-CM

## 2023-04-30 DIAGNOSIS — J189 Pneumonia, unspecified organism: Secondary | ICD-10-CM

## 2023-04-30 DIAGNOSIS — I480 Paroxysmal atrial fibrillation: Secondary | ICD-10-CM

## 2023-04-30 DIAGNOSIS — J9621 Acute and chronic respiratory failure with hypoxia: Secondary | ICD-10-CM

## 2023-04-30 DIAGNOSIS — I5033 Acute on chronic diastolic (congestive) heart failure: Secondary | ICD-10-CM | POA: Diagnosis not present

## 2023-04-30 DIAGNOSIS — E669 Obesity, unspecified: Secondary | ICD-10-CM

## 2023-04-30 DIAGNOSIS — F418 Other specified anxiety disorders: Secondary | ICD-10-CM

## 2023-04-30 DIAGNOSIS — E039 Hypothyroidism, unspecified: Secondary | ICD-10-CM

## 2023-04-30 DIAGNOSIS — E785 Hyperlipidemia, unspecified: Secondary | ICD-10-CM

## 2023-04-30 LAB — BLOOD GAS, ARTERIAL
Acid-base deficit: 3.6 mmol/L — ABNORMAL HIGH (ref 0.0–2.0)
Bicarbonate: 20.6 mmol/L (ref 20.0–28.0)
Delivery systems: POSITIVE
Expiratory PAP: 5 cm[H2O]
FIO2: 0.35 %
Inspiratory PAP: 15 cm[H2O]
O2 Saturation: 95.2 %
Patient temperature: 37
pCO2 arterial: 34 mm[Hg] (ref 32–48)
pH, Arterial: 7.39 (ref 7.35–7.45)
pO2, Arterial: 73 mm[Hg] — ABNORMAL LOW (ref 83–108)

## 2023-04-30 LAB — CBC
HCT: 37.6 % — ABNORMAL LOW (ref 39.0–52.0)
Hemoglobin: 12 g/dL — ABNORMAL LOW (ref 13.0–17.0)
MCH: 29 pg (ref 26.0–34.0)
MCHC: 31.9 g/dL (ref 30.0–36.0)
MCV: 90.8 fL (ref 80.0–100.0)
Platelets: 330 10*3/uL (ref 150–400)
RBC: 4.14 MIL/uL — ABNORMAL LOW (ref 4.22–5.81)
RDW: 17.8 % — ABNORMAL HIGH (ref 11.5–15.5)
WBC: 21.8 10*3/uL — ABNORMAL HIGH (ref 4.0–10.5)
nRBC: 0 % (ref 0.0–0.2)

## 2023-04-30 LAB — PROTIME-INR
INR: 1.7 — ABNORMAL HIGH (ref 0.8–1.2)
Prothrombin Time: 20.2 s — ABNORMAL HIGH (ref 11.4–15.2)

## 2023-04-30 LAB — BASIC METABOLIC PANEL
Anion gap: 13 (ref 5–15)
BUN: 42 mg/dL — ABNORMAL HIGH (ref 8–23)
CO2: 19 mmol/L — ABNORMAL LOW (ref 22–32)
Calcium: 8.9 mg/dL (ref 8.9–10.3)
Chloride: 104 mmol/L (ref 98–111)
Creatinine, Ser: 1.35 mg/dL — ABNORMAL HIGH (ref 0.61–1.24)
GFR, Estimated: 53 mL/min — ABNORMAL LOW (ref >60)
Glucose, Bld: 238 mg/dL — ABNORMAL HIGH (ref 70–99)
Potassium: 5.2 mmol/L — ABNORMAL HIGH (ref 3.5–5.1)
Sodium: 136 mmol/L (ref 135–145)

## 2023-04-30 LAB — TSH: TSH: 0.43 u[IU]/mL (ref 0.350–4.500)

## 2023-04-30 LAB — HEPARIN LEVEL (UNFRACTIONATED): Heparin Unfractionated: >1.1 [IU]/mL — ABNORMAL HIGH (ref 0.30–0.70)

## 2023-04-30 LAB — APTT: aPTT: 41 s — ABNORMAL HIGH (ref 24–36)

## 2023-04-30 LAB — CBG MONITORING, ED: Glucose-Capillary: 231 mg/dL — ABNORMAL HIGH (ref 70–99)

## 2023-04-30 LAB — STREP PNEUMONIAE URINARY ANTIGEN: Strep Pneumo Urinary Antigen: NEGATIVE

## 2023-04-30 LAB — AMMONIA: Ammonia: 13 umol/L (ref 9–35)

## 2023-04-30 MED ORDER — CITALOPRAM HYDROBROMIDE 20 MG PO TABS
10.0000 mg | ORAL_TABLET | Freq: Every day | ORAL | Status: DC
  Administered 2023-05-01 – 2023-05-08 (×8): 10 mg via ORAL
  Filled 2023-04-30 (×9): qty 1

## 2023-04-30 MED ORDER — LEVOTHYROXINE SODIUM 50 MCG PO TABS
75.0000 ug | ORAL_TABLET | Freq: Every day | ORAL | Status: DC
  Administered 2023-05-02 – 2023-05-09 (×8): 75 ug via ORAL
  Filled 2023-04-30 (×5): qty 1
  Filled 2023-04-30: qty 2
  Filled 2023-04-30 (×3): qty 1

## 2023-04-30 MED ORDER — LORAZEPAM 2 MG/ML IJ SOLN
2.0000 mg | Freq: Once | INTRAMUSCULAR | Status: AC
  Administered 2023-04-30: 2 mg via INTRAVENOUS
  Filled 2023-04-30: qty 1

## 2023-04-30 MED ORDER — SODIUM CHLORIDE 0.9 % IV SOLN
2.0000 g | Freq: Two times a day (BID) | INTRAVENOUS | Status: DC
  Administered 2023-04-30 – 2023-05-04 (×8): 2 g via INTRAVENOUS
  Filled 2023-04-30 (×8): qty 12.5

## 2023-04-30 MED ORDER — SENNOSIDES-DOCUSATE SODIUM 8.6-50 MG PO TABS: 1.0000 | ORAL_TABLET | Freq: Every day | ORAL | Status: DC

## 2023-04-30 MED ORDER — VANCOMYCIN HCL 1500 MG/300ML IV SOLN
1500.0000 mg | Freq: Once | INTRAVENOUS | Status: AC
  Administered 2023-04-30: 1500 mg via INTRAVENOUS
  Filled 2023-04-30: qty 300

## 2023-04-30 MED ORDER — ATORVASTATIN CALCIUM 20 MG PO TABS
20.0000 mg | ORAL_TABLET | Freq: Every day | ORAL | Status: DC
  Administered 2023-05-01 – 2023-05-08 (×8): 20 mg via ORAL
  Filled 2023-04-30 (×8): qty 1

## 2023-04-30 MED ORDER — POLYETHYLENE GLYCOL 3350 17 G PO PACK
17.0000 g | PACK | Freq: Every day | ORAL | Status: DC
  Administered 2023-05-01: 17 g via ORAL
  Filled 2023-04-30: qty 1

## 2023-04-30 MED ORDER — OCUSOFT LID SCRUB ORIGINAL EX PADS: 1.0000 | MEDICATED_PAD | Freq: Every day | CUTANEOUS | Status: DC

## 2023-04-30 MED ORDER — FLEET ENEMA RE ENEM
1.0000 | ENEMA | Freq: Every day | RECTAL | Status: DC
  Administered 2023-04-30: 1 via RECTAL

## 2023-04-30 MED ORDER — VANCOMYCIN HCL IN DEXTROSE 1-5 GM/200ML-% IV SOLN
1000.0000 mg | Freq: Once | INTRAVENOUS | Status: AC
  Administered 2023-04-30: 1000 mg via INTRAVENOUS
  Filled 2023-04-30: qty 200

## 2023-04-30 MED ORDER — HYDROXYZINE HCL 25 MG PO TABS: 25.0000 mg | ORAL_TABLET | Freq: Four times a day (QID) | ORAL | Status: DC | PRN

## 2023-04-30 MED ORDER — GABAPENTIN 400 MG PO CAPS
800.0000 mg | ORAL_CAPSULE | Freq: Four times a day (QID) | ORAL | Status: DC
  Administered 2023-05-01 – 2023-05-09 (×33): 800 mg via ORAL
  Filled 2023-04-30 (×36): qty 2
  Filled 2023-04-30: qty 8
  Filled 2023-04-30 (×5): qty 2

## 2023-04-30 MED ORDER — SODIUM CHLORIDE 0.9 % IV SOLN
2.0000 g | Freq: Three times a day (TID) | INTRAVENOUS | Status: DC
  Administered 2023-04-30 (×2): 2 g via INTRAVENOUS
  Filled 2023-04-30 (×2): qty 12.5

## 2023-04-30 MED ORDER — BISACODYL 10 MG RE SUPP
10.0000 mg | Freq: Once | RECTAL | Status: AC
  Administered 2023-04-30: 10 mg via RECTAL
  Filled 2023-04-30 (×2): qty 1

## 2023-04-30 MED ORDER — OXYBUTYNIN CHLORIDE 5 MG PO TABS
5.0000 mg | ORAL_TABLET | Freq: Two times a day (BID) | ORAL | Status: DC
  Administered 2023-05-01 – 2023-05-09 (×17): 5 mg via ORAL
  Filled 2023-04-30 (×22): qty 1

## 2023-04-30 MED ORDER — METHYLPREDNISOLONE SODIUM SUCC 125 MG IJ SOLR
80.0000 mg | Freq: Every day | INTRAMUSCULAR | Status: DC
Start: 2023-04-30 — End: 2023-05-05
  Administered 2023-04-30 – 2023-05-05 (×6): 80 mg via INTRAVENOUS
  Filled 2023-04-30 (×6): qty 2

## 2023-04-30 MED ORDER — DILTIAZEM HCL ER COATED BEADS 240 MG PO CP24: 240.0000 mg | ORAL_CAPSULE | Freq: Every day | ORAL | Status: DC

## 2023-04-30 MED ORDER — HEPARIN (PORCINE) 25000 UT/250ML-% IV SOLN
1400.0000 [IU]/h | INTRAVENOUS | Status: DC
  Administered 2023-04-30: 1200 [IU]/h via INTRAVENOUS
  Administered 2023-05-02 – 2023-05-03 (×2): 1400 [IU]/h via INTRAVENOUS
  Filled 2023-04-30 (×4): qty 250

## 2023-04-30 MED ORDER — FUROSEMIDE 10 MG/ML IJ SOLN
40.0000 mg | Freq: Every day | INTRAMUSCULAR | Status: DC
  Administered 2023-05-01 – 2023-05-03 (×3): 40 mg via INTRAVENOUS
  Filled 2023-04-30 (×3): qty 4

## 2023-04-30 MED ORDER — SENNOSIDES-DOCUSATE SODIUM 8.6-50 MG PO TABS
1.0000 | ORAL_TABLET | Freq: Two times a day (BID) | ORAL | Status: DC
  Administered 2023-05-01 – 2023-05-09 (×11): 1 via ORAL
  Filled 2023-04-30 (×14): qty 1

## 2023-04-30 MED ORDER — NORTRIPTYLINE HCL 10 MG PO CAPS
20.0000 mg | ORAL_CAPSULE | Freq: Every day | ORAL | Status: DC
  Filled 2023-04-30 (×2): qty 2

## 2023-04-30 MED ORDER — TIZANIDINE HCL 2 MG PO TABS: 2.0000 mg | ORAL_TABLET | Freq: Two times a day (BID) | ORAL | Status: DC | PRN

## 2023-04-30 MED ORDER — POLYETHYLENE GLYCOL 3350 17 G PO PACK: 17.0000 g | PACK | Freq: Two times a day (BID) | ORAL | Status: DC

## 2023-04-30 MED ORDER — VANCOMYCIN HCL IN DEXTROSE 1-5 GM/200ML-% IV SOLN
1000.0000 mg | Freq: Every day | INTRAVENOUS | Status: AC
  Administered 2023-04-30 – 2023-05-04 (×5): 1000 mg via INTRAVENOUS
  Filled 2023-04-30 (×5): qty 200

## 2023-04-30 MED ORDER — VANCOMYCIN HCL 1250 MG/250ML IV SOLN: 1250.0000 mg | INTRAVENOUS | Status: DC

## 2023-04-30 MED ORDER — HEPARIN BOLUS VIA INFUSION
2200.0000 [IU] | Freq: Once | INTRAVENOUS | Status: AC
  Administered 2023-04-30: 2200 [IU] via INTRAVENOUS
  Filled 2023-04-30: qty 2200

## 2023-04-30 NOTE — Assessment & Plan Note (Addendum)
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  Will advance to solid food.  Abdomen less distended than yesterday.  Continue MiraLAX to twice daily.  Continue lactulose daily.  Continue enema at night.  Surgical team does not think he would survive a surgical procedure.

## 2023-04-30 NOTE — Assessment & Plan Note (Signed)
Continue home medication

## 2023-04-30 NOTE — Consult Note (Signed)
[facility] SURGICAL CONSULTATION NOTE (initial) - cpt: 09811   HISTORY OF PRESENT ILLNESS (HPI):  78 y.o. male presented to [facility] ED yesterday for evaluation of SOB. This morning patient is on BiPAP. He arouse briefly but does not significantly contribute to history. On chart review, patient presented to the ED last night secondary to complaints of progressive SOB over the last few days with associated leg swelling and cough. At the time, he denied any fever, chills, CP, nausea, emesis, or abdominal pain. In the ED, he was found to be distended. He again denied any abdominal pain this morning. He is unable to illicit any further history at time of my evaluation. No family present. He has significant comorbid history including COPD on 2L O2, HTN,HLD, dCHF, suprapubic Foley catheter, neurogenic bladder, A-fib on Eliquis, ESBL UTI, spinal stenosis, wheelchair bound, Parkinson's disease, hypertension, DM, hypothyroidism, gout, depression with anxiety, BPH, anemia. Review of surgical history is positive for appendectomy and hernia repair. CT Abdomen/Pelvis was obtained and concerning for significant colonic distension and stool burden, some associated small bowel distension. No free air. No pneumatosis. Lung views also worrisome for PNA. He was admitted to the medicine service. Most recent labs revealed a WBC to 21.8K, Hgb to 12.0, hyperkalemia to 5.2, sCr - 1.35, venous lactate 2.7. He is currently on Cefepime and Vancomycin for suspected HCAP.   Surgery is consulted by hospitalist physician Dr. Lorretta Harp, MD in this context for evaluation and management of possible obstruction vs ileus vs severe constipation.  PAST MEDICAL HISTORY (PMH):  Past Medical History:  Diagnosis Date   Allergic rhinitis    Anxiety    Arthritis    Atrial fibrillation (HCC)    Chronic indwelling Foley catheter    COPD (chronic obstructive pulmonary disease) (HCC)    Cough    Diabetes (HCC)    Essential  (primary) hypertension    Hemorrhoid    Hyperlipidemia    PAF (paroxysmal atrial fibrillation) (HCC)    Primary osteoarthritis, unspecified shoulder    Retention of urine, unspecified    Spinal stenosis    UTI (urinary tract infection)      PAST SURGICAL HISTORY (PSH):  Past Surgical History:  Procedure Laterality Date   APPENDECTOMY     BACK SURGERY     COLONOSCOPY N/A 10/27/2020   Procedure: COLONOSCOPY;  Surgeon: Wyline Mood, MD;  Location: [facility] ENDOSCOPY;  Service: Gastroenterology;  Laterality: N/A;   ELBOW SURGERY     ESOPHAGOGASTRODUODENOSCOPY (EGD) WITH PROPOFOL N/A 10/27/2020   Procedure: ESOPHAGOGASTRODUODENOSCOPY (EGD) WITH PROPOFOL;  Surgeon: Wyline Mood, MD;  Location: [facility] ENDOSCOPY;  Service: Gastroenterology;  Laterality: N/A;   HERNIA REPAIR     KNEE SURGERY     neck     PARTIAL HIP ARTHROPLASTY Bilateral    TEE WITHOUT CARDIOVERSION N/A 10/19/2020   Procedure: TRANSESOPHAGEAL ECHOCARDIOGRAM (TEE);  Surgeon: Antonieta Iba, MD;  Location: [facility] ORS;  Service: Cardiovascular;  Laterality: N/A;   TONSILLECTOMY       MEDICATIONS:  Prior to Admission medications   Medication Sig Start Date End Date Taking? Authorizing Provider  acetic acid 0.25 % irrigation Irrigate with 1 Application as directed every 12 (twelve) hours. (Flush foley catheter)   Yes [provider]  Albuterol Sulfate (PROAIR RESPICLICK) 108 (90 Base) MCG/ACT AEPB Inhale 2 puffs into the lungs every 4 (four) hours as needed (COPD).   Yes [provider]  allopurinol (ZYLOPRIM) 100 MG tablet Take 200 mg by mouth daily.  Yes [provider]  apixaban (ELIQUIS) 5 MG TABS tablet Take 5 mg by mouth 2 (two) times daily.    Yes [provider]  ascorbic acid (VITAMIN C) 500 MG tablet Take 500 mg by mouth daily.   Yes [provider]  atorvastatin (LIPITOR) 20 MG tablet Take 20 mg by mouth at bedtime.   Yes [provider]  bisacodyl (DULCOLAX) 10 MG  suppository Place 10 mg rectally every 3 (three) days. (HOLD if having loose stools)   Yes [provider]  Calcium Carbonate (CALCIUM 500 PO) Take 2 tablets by mouth every 6 (six) hours as needed (indigestion/heartburn).   Yes [provider]  carbidopa-levodopa (SINEMET IR) 25-100 MG tablet Take 1 tablet by mouth 3 (three) times daily before meals.   Yes [provider]  cetirizine (ZYRTEC) 5 MG tablet Take 5 mg by mouth at bedtime.   Yes [provider]  Cholecalciferol (VITAMIN D-3) 25 MCG (1000 UT) CAPS Take 2,000 Units by mouth daily.   Yes [provider]  citalopram (CELEXA) 10 MG tablet Take 10 mg by mouth at bedtime.   Yes [provider]  diltiazem (CARDIZEM CD) 240 MG 24 hr capsule Take 1 capsule (240 mg total) by mouth daily. 03/17/23  Yes Shalhoub, Deno Lunger, MD  docusate sodium (COLACE) 100 MG capsule Take 100 mg by mouth daily.   Yes [provider]  doxycycline (MONODOX) 100 MG capsule Take 100 mg by mouth 2 (two) times daily. 04/28/23  Yes [provider]  Emollient (CETAPHIL) cream Apply 1 Application topically 2 (two) times daily as needed.   Yes [provider]  Eyelid Cleansers (OCUSOFT LID SCRUB ORIGINAL) PADS Place 1 Application into both eyes daily. Wipe both eyelids once per day.   Yes [provider]  Eyelid Cleansers (OCUSOFT LID SCRUB ORIGINAL) PADS Apply 1 Pad topically every 12 (twelve) hours as needed.   Yes [provider]  famotidine (PEPCID) 20 MG tablet Take 20 mg by mouth at bedtime.   Yes [provider]  ferrous sulfate 324 (65 Fe) MG TBEC Take 1 tablet by mouth daily. 12/05/22  Yes [provider]  fluticasone (FLONASE) 50 MCG/ACT nasal spray Place 2 sprays into both nostrils daily.   Yes [provider]  Fluticasone-Umeclidin-Vilant 100-62.5-25 MCG/ACT AEPB Inhale 1 puff into the lungs daily.   Yes [provider]  furosemide  (LASIX) 20 MG tablet Take 20 mg by mouth daily as needed for edema. 10/21/22  Yes [provider]  gabapentin (NEURONTIN) 800 MG tablet Take 800 mg by mouth 4 (four) times daily.   Yes [provider]  guaifenesin (ROBITUSSIN) 100 MG/5ML syrup Take 200 mg by mouth every 4 (four) hours as needed for cough.   Yes [provider]  hydrocortisone (ANUSOL-HC) 25 MG suppository Place 25 mg rectally every 4 (four) hours as needed for hemorrhoids or anal itching.   Yes [provider]  hydrOXYzine (ATARAX/VISTARIL) 25 MG tablet Take 25 mg by mouth every 6 (six) hours as needed for itching. 02/17/20  Yes [provider]  ipratropium (ATROVENT) 0.06 % nasal spray Place 2 sprays into both nostrils 3 (three) times daily. 11/04/22  Yes [provider]  ipratropium-albuterol (DUONEB) 0.5-2.5 (3) MG/3ML SOLN Take 3 mLs by nebulization every 6 (six) hours.   Yes [provider]  lactase (LACTAID) 3000 units tablet Take 3,000 Units by mouth 3 (three) times daily with meals.   Yes [provider]  levothyroxine (SYNTHROID) 75 MCG tablet Take 75 mcg by mouth daily before breakfast.   Yes [provider]  lidocaine 4 % Place 1 patch onto the skin daily as needed (pain). (Remove after 12 hours)   Yes [provider]  melatonin 5 MG TABS Take 10 mg by mouth at bedtime.   Yes [provider]  Menthol, Topical Analgesic, (BIOFREEZE) 4 % GEL Apply 1 application  topically 3 (three) times daily. (Apply to neck)   Yes [provider]  methocarbamol (ROBAXIN) 500 MG tablet Take 1,000 mg by mouth every 12 (twelve) hours. At 1400   Yes [provider]  nortriptyline (PAMELOR) 10 MG capsule Take 20 mg by mouth at bedtime. 12/05/22  Yes [provider]  nystatin (MYCOSTATIN) 100000 UNIT/ML suspension Take 5 mLs by mouth 4 (four) times daily -  before meals and at bedtime. AFTER meals and at bedtime; swish and  swallow. Please leave at bedside so resident can self administer for thrush. 04/25/23 05/02/23 Yes [provider]  oxybutynin (DITROPAN) 5 MG tablet Take 5 mg by mouth 2 (two) times daily.   Yes [provider]  polyethylene glycol (MIRALAX / GLYCOLAX) 17 g packet Take 17 g by mouth daily.   Yes [provider]  pramoxine (SARNA SENSITIVE) 1 % LOTN Apply 1 Application topically 4 (four) times daily as needed.   Yes [provider]  predniSONE (DELTASONE) 20 MG tablet Take 20 mg by mouth daily. 04/28/23 05/04/23 Yes [provider]  Probiotic Product (ACIDOPHILUS PROBIOTIC BLEND) CAPS Take 1 capsule by mouth daily. 12/05/22  Yes [provider]  Propylene Glycol 0.6 % SOLN Place 2 drops into both eyes in the morning, at noon, and at bedtime.   Yes [provider]  psyllium (METAMUCIL SMOOTH TEXTURE) 58.6 % powder Take 1 packet by mouth 3 (three) times daily. 09/29/22  Yes Chesley Noon, MD  selenium sulfide (SELSUN) 1 % LOTN Apply 1 Application topically 2 (two) times a week. (Tuesday and Friday)   Yes [provider]  senna (SENOKOT) 8.6 MG TABS tablet Take 2 tablets (17.2 mg total) by mouth at bedtime. 03/16/23  Yes Shalhoub, Deno Lunger, MD  silver sulfADIAZINE (SILVADENE) 1 % cream Apply 1 Application topically daily. (Apply to arms and face)   Yes [provider]  sodium chloride (OCEAN) 0.65 % SOLN nasal spray Place 2 sprays into both nostrils every 6 (six) hours as needed for congestion.   Yes [provider]  tacrolimus (PROTOPIC) 0.03 % ointment Apply 1 application  topically daily. (Apply to eyelids)   Yes [provider]  terbinafine (LAMISIL) 1 % cream Apply 1 Application topically at bedtime. (Apply to feet)   Yes [provider]  tiZANidine (ZANAFLEX) 2 MG tablet Take 2 mg by mouth 2 (two) times daily as needed for muscle spasms.   Yes [provider]  triamcinolone cream (KENALOG)  0.1 % Apply 1 Application topically daily as needed. Apply to arms, legs, and back r/t rash   Yes [provider]  senna (SENOKOT) 8.6 MG TABS tablet Take 1 tablet (8.6 mg total) by mouth daily as needed for mild constipation. Patient not taking: Reported on 04/30/2023 09/29/22   Chesley Noon, MD     ALLERGIES:  No Known Allergies   SOCIAL HISTORY:  Social History   Socioeconomic History   Marital status: Divorced    Spouse name: Not on file   Number of children: Not on  file   Years of education: Not on file   Highest education level: Not on file  Occupational History   Occupation: retired    Comment: retired Hotel manager x 4 years; Scientist, physiological; Arboriculturist as Academic librarian  Tobacco Use   Smoking status: Former    Current packs/day: 0.00    Average packs/day: 0.5 packs/day for 10.0 years (5.0 ttl pk-yrs)    Types: Cigarettes    Start date: 22    Quit date: 2006    Years since quitting: 19.0   Smokeless tobacco: Never  Vaping Use   Vaping status: Never Used  Substance and Sexual Activity   Alcohol use: Not Currently   Drug use: Not Currently    Types: "Crack" cocaine    Comment: quit "crack" 15 years ago--11/04/2019   Sexual activity: Not on file  Other Topics Concern   Not on file  Social History Narrative   Not on file   Social Drivers of Health   Financial Resource Strain: Not on file  Food Insecurity: No Food Insecurity (03/11/2023)   Hunger Vital Sign    Worried About Running Out of Food in the Last Year: Never true    Ran Out of Food in the Last Year: Never true  Transportation Needs: No Transportation Needs (03/11/2023)   PRAPARE - Administrator, Civil Service (Medical): No    Lack of Transportation (Non-Medical): No  Physical Activity: Not on file  Stress: Not on file  Social Connections: Unknown (11/30/2021)   Received from [facility]   Social Network    Social Network: Not on file  Intimate Partner Violence: Not At Risk  (03/11/2023)   Humiliation, Afraid, Rape, and Kick questionnaire    Fear of Current or Ex-Partner: No    Emotionally Abused: No    Physically Abused: No    Sexually Abused: No     FAMILY HISTORY:  Family History  Problem Relation Age of Onset   Healthy Son    Healthy Son       REVIEW OF SYSTEMS:  Review of Systems  Unable to perform ROS: Mental status change  Respiratory:  Positive for shortness of breath.   Gastrointestinal:        + Distension     VITAL SIGNS:  Temp:  [97.6 F (36.4 C)-99.1 F (37.3 C)] 97.6 F (36.4 C) (01/29 5621) Pulse Rate:  [106-148] 107 (01/29 0700) Resp:  [24-33] 33 (01/29 0700) BP: (113-160)/(84-125) 160/107 (01/29 0700) SpO2:  [97 %-100 %] 100 % (01/29 0700) FiO2 (%):  [35 %-60 %] 35 % (01/29 0405) Weight:  [100 kg] 100 kg (01/28 1457)     Height: 5\' 7"  (170.2 cm) Weight: 100 kg BMI (Calculated): 34.52   INTAKE/OUTPUT:  No intake/output data recorded.  PHYSICAL EXAM:  Physical Exam Vitals and nursing note reviewed. Exam conducted with a chaperone present.  Constitutional:      Appearance: Normal appearance. He is normal weight.     Comments: Patient is somnolent; arouses briefly to verbal stimuli but falls back to sleep Bedside RN present  HENT:     Head: Normocephalic and atraumatic.  Cardiovascular:     Rate and Rhythm: Tachycardia present. Rhythm irregularly irregular.  Pulmonary:     Effort: No tachypnea or accessory muscle usage.     Comments: On BiPAP Abdominal:     General: Abdomen is protuberant. A surgical scar is present. There is distension.     Palpations: Abdomen is soft.  Tenderness: There is no abdominal tenderness. There is no guarding or rebound.     Comments: Abdomen is markedly distended and tympanic, he does not appear tender, no rebound/guarding. He is not peritonitic at this time      Labs:     Latest Ref Rng & Units 04/30/2023    3:39 AM 04/29/2023    3:03 PM 03/16/2023    3:57 AM  CBC  WBC 4.0 -  10.5 K/uL 21.8  17.2  9.8   Hemoglobin 13.0 - 17.0 g/dL 16.1  09.6  04.5   Hematocrit 39.0 - 52.0 % 37.6  41.8  36.7   Platelets 150 - 400 K/uL 330  364  341       Latest Ref Rng & Units 04/30/2023    3:39 AM 04/29/2023    3:03 PM 03/16/2023    3:57 AM  CMP  Glucose 70 - 99 mg/dL 409  811  914   BUN 8 - 23 mg/dL 42  29  27   Creatinine 0.61 - 1.24 mg/dL 7.82  9.56  2.13   Sodium 135 - 145 mmol/L 136  138  138   Potassium 3.5 - 5.1 mmol/L 5.2  4.5  4.6   Chloride 98 - 111 mmol/L 104  99  105   CO2 22 - 32 mmol/L 19  21  26    Calcium 8.9 - 10.3 mg/dL 8.9  9.6  9.0   Total Protein 6.5 - 8.1 g/dL   7.0   Total Bilirubin <1.2 mg/dL   0.5   Alkaline Phos 38 - 126 U/L   106   AST 15 - 41 U/L   17   ALT 0 - 44 U/L   9      Imaging studies:   CT Abdomen/Pelvis (04/29/2023) personally reviewed concerning for severe constipation and associated colonic distension, no free air, no pneumatosis, and radiologist report reviewed below:  IMPRESSION: 1. Diffuse dilatation of the colon to the level of the rectum with a large amount of stool in the rectosigmoid colon. Findings are favored as ileus secondary to constipation. 2. Dilated small bowel loops in the right lower quadrant measuring up to 4.2 cm. Findings may be related to ileus or partial small bowel obstruction. 3. Cholelithiasis. 4. Bilateral renal cysts and hypodensities which are too small to characterize. No follow-up imaging recommended. 5. Suprapubic Foley catheter decompresses the bladder. 6. Bilateral lower lobe and right middle lobe airspace consolidation worrisome for multifocal pneumonia. 7. Aortic atherosclerosis.   Assessment/Plan: (ICD-10's: K56.7) 78 y.o. malepresenting with SOB found to have possible multifocal PNA and/or COPD exacerbation also noted to have significant abdominal distension and marked stool burden, complicated by numerous comorbidities and physical deconditioning   - From an abdominal perspective,  he is markedly distended buit does not appear overtly tender. Suspect his abdominal changes are secondary to severe constipation and/or ileus secondary to acute medical illness (ie: PNA). SBO is possible given previous surgical history but least likely at this moment.  - Agree with holding on NGT at this time given his respiratory status/need for BiPAP as well as that his primary distension is colonic and NGT will not decompress this.  - No indication for urgent surgical intervention - He needs aggressive bowel regimen, okay to give PO and enema as needed - Would keep NPO for now expect sips/medications   - Monitor abdominal examination; on-going bowel function   - Serial KUB as needed - Pain control prn; limit narcotics - Antiemetics  prn - Monitor leukocytosis; suspect secondary to PNA - Monitor renal function - Correct electrolyte derangements - Work with therapies as feasible - Further management per primary service; we will follow    - DVT prophylaxis  All of the above findings and recommendations were discussed with the medical team.  Thank you for the opportunity to participate in this patient's care.   -- Lynden Oxford, PA-C [facility] 04/30/2023, 7:26 AM M-F: 7am - 4pm

## 2023-04-30 NOTE — Assessment & Plan Note (Signed)
Continue levothyroxine

## 2023-04-30 NOTE — Assessment & Plan Note (Signed)
Continue Sinemet ?

## 2023-04-30 NOTE — Consult Note (Signed)
NEUROLOGY CONSULT NOTE   Date of service: April 30, 2023 Patient Name: Raymond Hays MRN:  [id number] DOB:  Oct 16, 1945 Chief Complaint: "unresponsiveness" Requesting Provider: Alford Highland, MD  History of Present Illness  Raymond Hays is a 78 y.o. male with hx of Parkinson's disease who presented yesterday with increasing shortness of breath and confusion.  Today, it was noted that he had decreased responsiveness with reportedly pinpoint pupils.  He was taken emergently for MRI which is negative.  ABG was 7.39 / 34 / 73.  By the time he returned from MRI at which point I was able to evaluate him, he was awake, alert, very short of breath but able to answer some orientation questions.  He does have paroxysmal atrial fibrillation and is on apixaban.  Past History   Past Medical History:  Diagnosis Date   Allergic rhinitis    Anxiety    Arthritis    Atrial fibrillation (HCC)    Chronic indwelling Foley catheter    COPD (chronic obstructive pulmonary disease) (HCC)    Cough    Diabetes (HCC)    Essential (primary) hypertension    Hemorrhoid    Hyperlipidemia    PAF (paroxysmal atrial fibrillation) (HCC)    Primary osteoarthritis, unspecified shoulder    Retention of urine, unspecified    Spinal stenosis    UTI (urinary tract infection)     Past Surgical History:  Procedure Laterality Date   APPENDECTOMY     BACK SURGERY     COLONOSCOPY N/A 10/27/2020   Procedure: COLONOSCOPY;  Surgeon: Wyline Mood, MD;  Location: [facility] ENDOSCOPY;  Service: Gastroenterology;  Laterality: N/A;   ELBOW SURGERY     ESOPHAGOGASTRODUODENOSCOPY (EGD) WITH PROPOFOL N/A 10/27/2020   Procedure: ESOPHAGOGASTRODUODENOSCOPY (EGD) WITH PROPOFOL;  Surgeon: Wyline Mood, MD;  Location: [facility] ENDOSCOPY;  Service: Gastroenterology;  Laterality: N/A;   HERNIA REPAIR     KNEE SURGERY     neck     PARTIAL HIP ARTHROPLASTY Bilateral    TEE WITHOUT CARDIOVERSION N/A 10/19/2020   Procedure: TRANSESOPHAGEAL  ECHOCARDIOGRAM (TEE);  Surgeon: Antonieta Iba, MD;  Location: [facility] ORS;  Service: Cardiovascular;  Laterality: N/A;   TONSILLECTOMY      Family History: Family History  Problem Relation Age of Onset   Healthy Son    Healthy Son     Social History  reports that he quit smoking about 19 years ago. His smoking use included cigarettes. He started smoking about 29 years ago. He has a 5 pack-year smoking history. He has never used smokeless tobacco. He reports that he does not currently use alcohol. He reports that he does not currently use drugs after having used the following drugs: "Crack" cocaine.  No Known Allergies  Medications   Current Facility-Administered Medications:    acetaminophen (TYLENOL) tablet 650 mg, 650 mg, Oral, Q6H PRN, Lorretta Harp, MD   albuterol (PROVENTIL) (2.5 MG/3ML) 0.083% nebulizer solution 2.5 mg, 2.5 mg, Nebulization, Q4H PRN, Lorretta Harp, MD   atorvastatin (LIPITOR) tablet 20 mg, 20 mg, Oral, QHS, Lorretta Harp, MD   carbidopa-levodopa (SINEMET IR) 25-100 MG per tablet immediate release 1 tablet, 1 tablet, Oral, TID AC, Lorretta Harp, MD   ceFEPIme (MAXIPIME) 2 g in sodium chloride 0.9 % 100 mL IVPB, 2 g, Intravenous, Q8H, Lorretta Harp, MD, Last Rate: 200 mL/hr at 04/30/23 1043, 2 g at 04/30/23 1043   citalopram (CELEXA) tablet 10 mg, 10 mg, Oral, QHS, Lorretta Harp, MD   dextromethorphan-guaiFENesin (MUCINEX DM) 30-600 MG per  12 hr tablet 1 tablet, 1 tablet, Oral, BID PRN, Lorretta Harp, MD   diltiazem (CARDIZEM) 125 mg in dextrose 5% 125 mL (1 mg/mL) infusion, 5-15 mg/hr, Intravenous, Continuous, Dionne Bucy, MD, Last Rate: 15 mL/hr at 04/30/23 0940, 15 mg/hr at 04/30/23 0940   furosemide (LASIX) injection 40 mg, 40 mg, Intravenous, Q12H, Lorretta Harp, MD, 40 mg at 04/30/23 0430   gabapentin (NEURONTIN) capsule 800 mg, 800 mg, Oral, QID, Lorretta Harp, MD   hydrALAZINE (APRESOLINE) injection 5 mg, 5 mg, Intravenous, Q2H PRN, Lorretta Harp, MD   hydrOXYzine (ATARAX)  tablet 25 mg, 25 mg, Oral, Q6H PRN, Lorretta Harp, MD   ipratropium-albuterol (DUONEB) 0.5-2.5 (3) MG/3ML nebulizer solution 3 mL, 3 mL, Nebulization, Q4H, Lorretta Harp, MD, 3 mL at 04/30/23 0742   levothyroxine (SYNTHROID) tablet 75 mcg, 75 mcg, Oral, Q0600, Lorretta Harp, MD   methylPREDNISolone sodium succinate (SOLU-MEDROL) 125 mg/2 mL injection 80 mg, 80 mg, Intravenous, Daily, Lorretta Harp, MD, 80 mg at 04/30/23 0300   nortriptyline (PAMELOR) capsule 20 mg, 20 mg, Oral, QHS, Lorretta Harp, MD   ondansetron [facility]) injection 4 mg, 4 mg, Intravenous, Q8H PRN, Lorretta Harp, MD   oxybutynin (DITROPAN) tablet 5 mg, 5 mg, Oral, BID, Lorretta Harp, MD   polyethylene glycol (MIRALAX / GLYCOLAX) packet 17 g, 17 g, Oral, Daily, Lorretta Harp, MD   senna-docusate (Senokot-S) tablet 1 tablet, 1 tablet, Oral, BID, Lorretta Harp, MD   tiZANidine (ZANAFLEX) tablet 2 mg, 2 mg, Oral, BID PRN, Lorretta Harp, MD   Melene Muller ON 05/01/2023] vancomycin (VANCOREADY) IVPB 1250 mg/250 mL, 1,250 mg, Intravenous, Q24H, Lorretta Harp, MD  Current Outpatient Medications:    acetic acid 0.25 % irrigation, Irrigate with 1 Application as directed every 12 (twelve) hours. (Flush foley catheter), Disp: , Rfl:    Albuterol Sulfate (PROAIR RESPICLICK) 108 (90 Base) MCG/ACT AEPB, Inhale 2 puffs into the lungs every 4 (four) hours as needed (COPD)., Disp: , Rfl:    allopurinol (ZYLOPRIM) 100 MG tablet, Take 200 mg by mouth daily., Disp: , Rfl:    apixaban (ELIQUIS) 5 MG TABS tablet, Take 5 mg by mouth 2 (two) times daily. , Disp: , Rfl:    ascorbic acid (VITAMIN C) 500 MG tablet, Take 500 mg by mouth daily., Disp: , Rfl:    atorvastatin (LIPITOR) 20 MG tablet, Take 20 mg by mouth at bedtime., Disp: , Rfl:    bisacodyl (DULCOLAX) 10 MG suppository, Place 10 mg rectally every 3 (three) days. (HOLD if having loose stools), Disp: , Rfl:    Calcium Carbonate (CALCIUM 500 PO), Take 2 tablets by mouth every 6 (six) hours as needed (indigestion/heartburn)., Disp: ,  Rfl:    carbidopa-levodopa (SINEMET IR) 25-100 MG tablet, Take 1 tablet by mouth 3 (three) times daily before meals., Disp: , Rfl:    cetirizine (ZYRTEC) 5 MG tablet, Take 5 mg by mouth at bedtime., Disp: , Rfl:    Cholecalciferol (VITAMIN D-3) 25 MCG (1000 UT) CAPS, Take 2,000 Units by mouth daily., Disp: , Rfl:    citalopram (CELEXA) 10 MG tablet, Take 10 mg by mouth at bedtime., Disp: , Rfl:    diltiazem (CARDIZEM CD) 240 MG 24 hr capsule, Take 1 capsule (240 mg total) by mouth daily., Disp: , Rfl:    docusate sodium (COLACE) 100 MG capsule, Take 100 mg by mouth daily., Disp: , Rfl:    doxycycline (MONODOX) 100 MG capsule, Take 100 mg by mouth 2 (two) times daily., Disp: , Rfl:  Emollient (CETAPHIL) cream, Apply 1 Application topically 2 (two) times daily as needed., Disp: , Rfl:    Eyelid Cleansers (OCUSOFT LID SCRUB ORIGINAL) PADS, Place 1 Application into both eyes daily. Wipe both eyelids once per day., Disp: , Rfl:    Eyelid Cleansers (OCUSOFT LID SCRUB ORIGINAL) PADS, Apply 1 Pad topically every 12 (twelve) hours as needed., Disp: , Rfl:    famotidine (PEPCID) 20 MG tablet, Take 20 mg by mouth at bedtime., Disp: , Rfl:    ferrous sulfate 324 (65 Fe) MG TBEC, Take 1 tablet by mouth daily., Disp: , Rfl:    fluticasone (FLONASE) 50 MCG/ACT nasal spray, Place 2 sprays into both nostrils daily., Disp: , Rfl:    Fluticasone-Umeclidin-Vilant 100-62.5-25 MCG/ACT AEPB, Inhale 1 puff into the lungs daily., Disp: , Rfl:    furosemide (LASIX) 20 MG tablet, Take 20 mg by mouth daily as needed for edema., Disp: , Rfl:    gabapentin (NEURONTIN) 800 MG tablet, Take 800 mg by mouth 4 (four) times daily., Disp: , Rfl:    guaifenesin (ROBITUSSIN) 100 MG/5ML syrup, Take 200 mg by mouth every 4 (four) hours as needed for cough., Disp: , Rfl:    hydrocortisone (ANUSOL-HC) 25 MG suppository, Place 25 mg rectally every 4 (four) hours as needed for hemorrhoids or anal itching., Disp: , Rfl:    hydrOXYzine  (ATARAX/VISTARIL) 25 MG tablet, Take 25 mg by mouth every 6 (six) hours as needed for itching., Disp: , Rfl:    ipratropium (ATROVENT) 0.06 % nasal spray, Place 2 sprays into both nostrils 3 (three) times daily., Disp: , Rfl:    ipratropium-albuterol (DUONEB) 0.5-2.5 (3) MG/3ML SOLN, Take 3 mLs by nebulization every 6 (six) hours., Disp: , Rfl:    lactase (LACTAID) 3000 units tablet, Take 3,000 Units by mouth 3 (three) times daily with meals., Disp: , Rfl:    levothyroxine (SYNTHROID) 75 MCG tablet, Take 75 mcg by mouth daily before breakfast., Disp: , Rfl:    lidocaine 4 %, Place 1 patch onto the skin daily as needed (pain). (Remove after 12 hours), Disp: , Rfl:    melatonin 5 MG TABS, Take 10 mg by mouth at bedtime., Disp: , Rfl:    Menthol, Topical Analgesic, (BIOFREEZE) 4 % GEL, Apply 1 application  topically 3 (three) times daily. (Apply to neck), Disp: , Rfl:    methocarbamol (ROBAXIN) 500 MG tablet, Take 1,000 mg by mouth every 12 (twelve) hours. At 1400, Disp: , Rfl:    nortriptyline (PAMELOR) 10 MG capsule, Take 20 mg by mouth at bedtime., Disp: , Rfl:    nystatin (MYCOSTATIN) 100000 UNIT/ML suspension, Take 5 mLs by mouth 4 (four) times daily -  before meals and at bedtime. AFTER meals and at bedtime; swish and swallow. Please leave at bedside so resident can self administer for thrush., Disp: , Rfl:    oxybutynin (DITROPAN) 5 MG tablet, Take 5 mg by mouth 2 (two) times daily., Disp: , Rfl:    polyethylene glycol (MIRALAX / GLYCOLAX) 17 g packet, Take 17 g by mouth daily., Disp: , Rfl:    pramoxine (SARNA SENSITIVE) 1 % LOTN, Apply 1 Application topically 4 (four) times daily as needed., Disp: , Rfl:    predniSONE (DELTASONE) 20 MG tablet, Take 20 mg by mouth daily., Disp: , Rfl:    Probiotic Product (ACIDOPHILUS PROBIOTIC BLEND) CAPS, Take 1 capsule by mouth daily., Disp: , Rfl:    Propylene Glycol 0.6 % SOLN, Place 2 drops into both eyes  in the morning, at noon, and at bedtime., Disp: ,  Rfl:    psyllium (METAMUCIL SMOOTH TEXTURE) 58.6 % powder, Take 1 packet by mouth 3 (three) times daily., Disp: 283 g, Rfl: 1   selenium sulfide (SELSUN) 1 % LOTN, Apply 1 Application topically 2 (two) times a week. (Tuesday and Friday), Disp: , Rfl:    senna (SENOKOT) 8.6 MG TABS tablet, Take 2 tablets (17.2 mg total) by mouth at bedtime., Disp: , Rfl:    silver sulfADIAZINE (SILVADENE) 1 % cream, Apply 1 Application topically daily. (Apply to arms and face), Disp: , Rfl:    sodium chloride (OCEAN) 0.65 % SOLN nasal spray, Place 2 sprays into both nostrils every 6 (six) hours as needed for congestion., Disp: , Rfl:    tacrolimus (PROTOPIC) 0.03 % ointment, Apply 1 application  topically daily. (Apply to eyelids), Disp: , Rfl:    terbinafine (LAMISIL) 1 % cream, Apply 1 Application topically at bedtime. (Apply to feet), Disp: , Rfl:    tiZANidine (ZANAFLEX) 2 MG tablet, Take 2 mg by mouth 2 (two) times daily as needed for muscle spasms., Disp: , Rfl:    triamcinolone cream (KENALOG) 0.1 %, Apply 1 Application topically daily as needed. Apply to arms, legs, and back r/t rash, Disp: , Rfl:    senna (SENOKOT) 8.6 MG TABS tablet, Take 1 tablet (8.6 mg total) by mouth daily as needed for mild constipation. (Patient not taking: Reported on 04/30/2023), Disp: 120 tablet, Rfl: 0  Vitals   Vitals:   04/30/23 0830 04/30/23 0919 04/30/23 0930 04/30/23 1030  BP: (!) 136/92  (!) 132/107 (!) 134/103  Pulse: 100 (!) 105 (!) 112 (!) 102  Resp: (!) 24 (!) 23 19 (!) 22  Temp:    97.7 F (36.5 C)  TempSrc:    Oral  SpO2: 99% 98% 97% 95%  Weight:      Height:        Body mass index is 34.53 kg/m.  Physical Exam   Constitutional: Appears well-developed and well-nourished.   Neurologic Examination    Neuro: Mental Status: Patient is awake, alert, oriented to person, year, he is unable to give the month.  He knows he is in a hospital, but not which one (guess is [facility]) No signs of aphasia or  neglect Cranial Nerves: II: Visual Fields are full. Pupils are equal, round, and reactive to light.   III,IV, VI: EOMI without ptosis or diploplia.  V: Facial sensation is symmetric to temperature VII: Facial movement is symmetric.  Motor: He is able to raise both arms against gravity, moves both legs to command.  He has increased tone in his left upper extremity. Sensory: Sensation is symmetric to light touch and temperature in the arms and legs. Cerebellar: Does not perform       Labs/Imaging/Neurodiagnostic studies   CBC:  Recent Labs  Lab May 18, 2023 1503 04/30/23 0339  WBC 17.2* 21.8*  HGB 13.6 12.0*  HCT 41.8 37.6*  MCV 91.3 90.8  PLT 364 330   Basic Metabolic Panel:  Lab Results  Component Value Date   NA 136 04/30/2023   K 5.2 (H) 04/30/2023   CO2 19 (L) 04/30/2023   GLUCOSE 238 (H) 04/30/2023   BUN 42 (H) 04/30/2023   CREATININE 1.35 (H) 04/30/2023   CALCIUM 8.9 04/30/2023   GFRNONAA 53 (L) 04/30/2023   GFRAA 55 (L) 12/15/2019   Lipid Panel: No results found for: "LDLCALC" HgbA1c:  Lab Results  Component Value Date  HGBA1C 6.8 (H) 03/16/2021   Urine Drug Screen: No results found for: "LABOPIA", "COCAINSCRNUR", "LABBENZ", "AMPHETMU", "THCU", "LABBARB"  Alcohol Level No results found for: "ETH" INR  Lab Results  Component Value Date   INR 2.1 (H) 03/17/2021   APTT  Lab Results  Component Value Date   APTT 39 (H) 03/16/2021     MRI Brain(Personally reviewed): Negative   ASSESSMENT   Raymond Hays is a 78 y.o. male with a transient episode of unresponsiveness earlier today.  The etiology of this is unclear, his ABG was mildly hypoxemic, but his O2 sats never seem to be very low.  I think TIA would be relatively unlikely, but a basilar TIA could be theoretically possible.  Seizure would be another consideration, though no clear evidence supporting this.  At the current time, I would favor getting an EEG, and restarting heparin in case this was TIA  will continue to monitor his mental status.  Most toxic/metabolic etiologies would not recover so quickly, but it is possible he was simply lethargic and took a while to come around.  RECOMMENDATIONS  EEG TSH, ammonia Neurology will follow  ______________________________________________________________________    Signed, Ritta Slot, MD [facility]

## 2023-04-30 NOTE — Hospital Course (Addendum)
78 year old man past medical history of COPD on chronic 2 L of oxygen, hypertension, hyperlipidemia, diastolic congestive heart failure, suprapubic Foley catheter, neurogenic bladder, atrial fibrillation on Eliquis, spinal stenosis, wheelchair-bound, Parkinson's disease, hypertension, diet-controlled diabetes, hypothyroidism, gout, depression, anxiety, BPH, anemia who presented with altered mental status and shortness of breath.  He was placed on BiPAP given Lasix and antibiotics and steroids.  Surgical consultation for distended abdomen likely secondary to constipation and/or ileus.  1/29.  Patient had altered mental status this morning MRI of the brain was done which showed no acute intracranial abnormality.  Patient off BiPAP and on oxygen upon reevaluation. 1/30.  Abdomen still distended.  Patient not having abdominal pain.  Surgeon did a rectal exam in the evening and disimpacted and had a lot of gas that came out.  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 1/31.  Abdomen less distended today.  Patient did have a bowel movement this morning.  Continue enemas in the evening will give a suppository.  Continue lactulose and MiraLAX. 2/1.  Patient had multiple bowel movements.  Diet advanced to solid food. 2/2.  Patient asking when he can go back to his facility.  States he always has a wheeze. 2/3.  Patient asked me to call his sister.  Awaiting approval to go back to his facility.

## 2023-04-30 NOTE — Assessment & Plan Note (Addendum)
BMI 33.84

## 2023-04-30 NOTE — ED Notes (Signed)
Pt cleaned of urine at this time, new brief and linen placed on pt and bed

## 2023-04-30 NOTE — ED Notes (Signed)
Called CCMD at this time to admit pt to cardiac monitoring system.

## 2023-04-30 NOTE — ED Notes (Signed)
RT taken pt off of Bipap and 4L Why, pt transported to MRI at this time.

## 2023-04-30 NOTE — Assessment & Plan Note (Addendum)
Continue oral Cardizem.

## 2023-04-30 NOTE — Assessment & Plan Note (Deleted)
Present on admission with leukocytosis tachycardia and tachypnea.  No fluids with CHF exacerbation.

## 2023-04-30 NOTE — Assessment & Plan Note (Deleted)
Multiple areas see full description below.  Present on admission.

## 2023-04-30 NOTE — ED Notes (Signed)
Earna Coder, [place] (general surgery) at bedside at this time. Pt is alert and responsive to voice but slow to respond.

## 2023-04-30 NOTE — Assessment & Plan Note (Addendum)
Came in with respiratory distress and tachypnea placed on BiPAP.  Chronically wears 2 L of oxygen.  Now on room air.

## 2023-04-30 NOTE — Assessment & Plan Note (Addendum)
On Cardizem drip and heparin drip.  Holding Eliquis currently.  Will add back Cardizem orally and try to get off Cardizem drip.

## 2023-04-30 NOTE — Assessment & Plan Note (Addendum)
Bilateral pneumonia seen on CT scan.  Currently on vancomycin and Maxipime.  White blood cell count trending better.

## 2023-04-30 NOTE — Assessment & Plan Note (Addendum)
MRI of the brain negative for stroke.  Mental status much improved from admission.

## 2023-04-30 NOTE — Assessment & Plan Note (Signed)
-  Continue Lipitor

## 2023-04-30 NOTE — ED Notes (Addendum)
Dr. Renae Gloss, MD at bedside at this time. Informed Dr. Renae Gloss in regards to pt's decreased responsiveness. See new orders.

## 2023-04-30 NOTE — Assessment & Plan Note (Addendum)
Continue Solu-Medrol and nebulizer treatments.  Patient has a lot of upper airway sounds secondary to not moving around.

## 2023-04-30 NOTE — Assessment & Plan Note (Addendum)
Hold Lasix.  Appears euvolemic.  Last EF 55%.

## 2023-04-30 NOTE — Progress Notes (Signed)
Pharmacy Antibiotic Note  Raymond Hays is a 78 y.o. male admitted on 04/29/2023 with pneumonia. PMH includes COPD (on 2 L O2 chronically), suprapubic Foley catheter, ESBL UTI, neurogenic bladder. Pt has increased oxygen requirements, initially requiring BiPAP and leukocytosis. Pharmacy has been consulted for cefepime and vancomycin dosing. Already initiated on cefepime and vancomycin while in ED.  Update 1/29: Renal function declined overnight, requiring antibiotic dose adjustment.  Plan: Change cefepime dosing to 2 g IV q8H Change vancomycin dosing to 1000 mg IV q24H eAUC 515, Cmax 33, Cmin 13.5 Scr 1.35, IBW, Vd 0.5 L/kg Order MRSA PCR Follow up culture results including MRSA PCR to assess for antibiotic optimization. Monitor renal function to assess for any necessary antibiotic dosing changes.  Height: 5\' 7"  (170.2 cm) Weight: 100 kg (220 lb 7.4 oz) IBW/kg (Calculated) : 66.1  Temp (24hrs), Avg:98.3 F (36.8 C), Min:97.6 F (36.4 C), Max:99.1 F (37.3 C)  Recent Labs  Lab 04/29/23 1503 04/29/23 1528 04/29/23 2314 04/30/23 0339  WBC 17.2*  --   --  21.8*  CREATININE 0.93  --   --  1.35*  LATICACIDVEN  --  2.0* 2.7*  --     Estimated Creatinine Clearance: 50 mL/min (A) (by C-G formula based on SCr of 1.35 mg/dL (H)).    No Known Allergies  Antimicrobials this admission: Ceftriaxone, Azithro 1/28 x 1 Cefepime 1/28 >>  Vancomycin 1/28  >>   Dose adjustments this admission: Cefepime 2 g IV q8H >> 2 g IV q12H Vancomycin 1250 mg IV q24H >> 1000 mg IV q12H  Microbiology results: 1/28 BCx: NGTD 1/29 MRSA PCR: ordered  Thank you for allowing pharmacy to be a part of this patient's care.  Will M. Dareen Piano, PharmD Clinical Pharmacist 04/30/2023 11:53 AM

## 2023-04-30 NOTE — Consult Note (Signed)
Pharmacy Consult Note - Anticoagulation  Pharmacy Consult for heparin Indication: atrial fibrillation  PATIENT MEASUREMENTS: Height: 5\' 7"  (170.2 cm) Weight: 100 kg (220 lb 7.4 oz) IBW/kg (Calculated) : 66.1 HEPARIN DW (KG): 87.8  VITAL SIGNS: Temp: 97.7 F (36.5 C) (01/29 1030) Temp Source: Oral (01/29 1030) BP: 101/86 (01/29 1600) Pulse Rate: 94 (01/29 1600)  Recent Labs    04/29/23 1503 04/30/23 0339  HGB 13.6 12.0*  HCT 41.8 37.6*  PLT 364 330  CREATININE 0.93 1.35*  TROPONINIHS 17  --     Estimated Creatinine Clearance: 50 mL/min (A) (by C-G formula based on SCr of 1.35 mg/dL (H)).  PAST MEDICAL HISTORY: Past Medical History:  Diagnosis Date   Allergic rhinitis    Anxiety    Arthritis    Atrial fibrillation (HCC)    Chronic indwelling Foley catheter    COPD (chronic obstructive pulmonary disease) (HCC)    Cough    Diabetes (HCC)    Essential (primary) hypertension    Hemorrhoid    Hyperlipidemia    PAF (paroxysmal atrial fibrillation) (HCC)    Primary osteoarthritis, unspecified shoulder    Retention of urine, unspecified    Spinal stenosis    UTI (urinary tract infection)     ASSESSMENT: 78 y.o. male with PMH including COPD, HTN, dCHF, Afib is presenting with pneumonia and acute on chronic heart failure. Patient is on chronic anticoagulation with Eliquis per chart review. Last dose unknown, will order baseline heparin level to assess for false elevation. Pharmacy has been consulted to initiate and manage heparin intravenous infusion.  Pertinent medications: Eliquis 5 mg twice daily  Goal(s) of therapy: Heparin level 0.3 - 0.7 units/mL aPTT 66 - 102 seconds Monitor platelets by anticoagulation protocol: Yes   Baseline anticoagulation labs: Recent Labs    04/29/23 1503 04/30/23 0339  HGB 13.6 12.0*  PLT 364 330    Date Time aPTT/HL Rate/Comment     PLAN: Give 2200 units bolus x1; then start heparin infusion at 1200 units/hour. Check  aPTT in 8 hours. Last dose unknown (med rec in process) but will order heparin level, too, in case of no false elevation in baseline Continue to titrate by aPTT until heparin level and aPTT correlate and/or Eliquis washes out, then titrate by heparin level alone. Check heparin level with next AM labs. Continue to monitor CBC daily while on heparin infusion.   Will M. Dareen Piano, PharmD Clinical Pharmacist 04/30/2023 5:54 PM

## 2023-04-30 NOTE — ED Notes (Addendum)
Report received from [place], [place].

## 2023-04-30 NOTE — Progress Notes (Signed)
Progress Note   Patient: Raymond Hays ZOX:[id number] DOB: 1945/04/20 DOA: 04/29/2023     1 DOS: the patient was seen and examined on 04/30/2023   Brief hospital course: 78 year old man past medical history of COPD on chronic 2 L of oxygen, hypertension, hyperlipidemia, diastolic congestive heart failure, suprapubic Foley catheter, neurogenic bladder, atrial fibrillation on Eliquis, spinal stenosis, wheelchair-bound, Parkinson's disease, hypertension, diet-controlled diabetes, hypothyroidism, gout, depression, anxiety, BPH, anemia who presented with altered mental status and shortness of breath.  He was placed on BiPAP given Lasix and antibiotics and steroids.  Surgical consultation for distended abdomen likely secondary to constipation and/or ileus.  1/29.  Patient had altered mental status this morning MRI of the brain was done which showed no acute intracranial abnormality.  Patient off BiPAP and on oxygen upon reevaluation.  Assessment and Plan: * Acute on chronic respiratory failure with hypoxia (HCC) Came in with respiratory distress placed on BiPAP.  Chronically wears 2 L of oxygen.  Now on 4 L.  COPD exacerbation (HCC) Continue Solu-Medrol and nebulizer treatments.  Multifocal pneumonia Bilateral pneumonia seen on CT scan.  Currently on vancomycin and Maxipime.  Acute on chronic diastolic CHF (congestive heart failure) (HCC) Patient placed on IV Lasix but with creatinine rising we will have to keep a close eye on this.  Last EF 55%.  Abdominal distension Could be ileus or severe constipation.  Will give Dulcolax suppository and nightly enema and also meds by mouth.  Currently NPO.  Acute metabolic encephalopathy Mental status impaired this morning but better late morning.  MRI of the brain negative for acute stroke.  Paroxysmal atrial fibrillation with rapid ventricular response (HCC) On Cardizem drip.  Holding Eliquis currently  Sepsis [facility]) Present on admission with  leukocytosis tachycardia and tachypnea.  No fluids with CHF exacerbation.  Essential hypertension On IV Lasix  Hypothyroidism Continue levothyroxine  Parkinson disease (HCC) Continue Sinemet  HLD (hyperlipidemia) Continue Lipitor  Depression with anxiety Continue home medication  Obesity (BMI 30-39.9) BMI 34.53  Pressure injury of skin Multiple areas see full description below.  Present on admission.        Subjective: Patient initially this morning was less responsive.  On reevaluation more responsive.  Came in with respiratory distress.  Physical Exam: Vitals:   04/30/23 0930 04/30/23 1030 04/30/23 1100 04/30/23 1130  BP: (!) 132/107 (!) 134/103 (!) 128/96 (!) 131/95  Pulse: (!) 112 (!) 102 (!) 108 (!) 103  Resp: 19 (!) 22 (!) 28 (!) 27  Temp:  97.7 F (36.5 C)    TempSrc:  Oral    SpO2: 97% 95% 96% 96%  Weight:      Height:       Physical Exam HENT:     Head: Normocephalic.  Eyes:     General: Lids are normal.     Conjunctiva/sclera: Conjunctivae normal.  Cardiovascular:     Rate and Rhythm: Normal rate and regular rhythm.     Heart sounds: Normal heart sounds, S1 normal and S2 normal.  Pulmonary:     Breath sounds: No decreased breath sounds, wheezing, rhonchi or rales.  Abdominal:     Palpations: Abdomen is soft.     Tenderness: There is no abdominal tenderness.  Musculoskeletal:     Right lower leg: No swelling.     Left lower leg: No swelling.  Skin:    General: Skin is warm.     Findings: No rash.  Neurological:     Mental Status: He is lethargic.  Data Reviewed: ABG on BiPAP showed a pCO2 of 73 pH of 7.39 and pCO2 of 34, creatinine 1.35 with a potassium of 5.2, white blood cell count 21.8, hemoglobin 12.0 and platelet count 330 MRI of the brain does not show any acute intracranial abnormality.  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 Family Communication: Updated patient's sister on the phone  Disposition: Status is: Inpatient Remains inpatient appropriate because: Still needs treatment for COPD exacerbation.  Being followed for likely ileus rather than small bowel obstruction.  Planned Discharge Destination: Long-term care    Time spent: 28 minutes  Author: Alford Highland, MD 04/30/2023 1:11 PM  For on call review [email or web address].

## 2023-04-30 NOTE — Progress Notes (Signed)
Pharmacy Antibiotic Note  Raymond Hays is a 78 y.o. male admitted on 04/29/2023 with pneumonia.  Pharmacy has been consulted for Vanc, cefepime dosing.  Plan: Cefepime 2 gm IV Q8H ordered to start on 1/29 @ 0300.  Vancomycin 2500 mg IV X 1 loading dose ordered for 1/29 @ ~ 0300. Vancomycin 1250 mg IV Q24H ordered to start on 1/29 @ ~ 0300.   AUC = 459.7 Vanc trough = 10.1   Height: 5\' 7"  (170.2 cm) Weight: 100 kg (220 lb 7.4 oz) IBW/kg (Calculated) : 66.1  Temp (24hrs), Avg:99 F (37.2 C), Min:98.8 F (37.1 C), Max:99.1 F (37.3 C)  Recent Labs  Lab 04/29/23 1503 04/29/23 1528 04/29/23 2314  WBC 17.2*  --   --   CREATININE 0.93  --   --   LATICACIDVEN  --  2.0* 2.7*    Estimated Creatinine Clearance: 72.6 mL/min (by C-G formula based on SCr of 0.93 mg/dL).    No Known Allergies  Antimicrobials this admission:   >>    >>   Dose adjustments this admission:   Microbiology results:  BCx:   UCx:    Sputum:    MRSA PCR:   Thank you for allowing pharmacy to be a part of this patient's care.  Anikka Marsan D 04/30/2023 2:48 AM

## 2023-05-01 ENCOUNTER — Inpatient Hospital Stay: Payer: No Typology Code available for payment source

## 2023-05-01 DIAGNOSIS — R404 Transient alteration of awareness: Secondary | ICD-10-CM

## 2023-05-01 DIAGNOSIS — J441 Chronic obstructive pulmonary disease with (acute) exacerbation: Secondary | ICD-10-CM | POA: Diagnosis not present

## 2023-05-01 DIAGNOSIS — J189 Pneumonia, unspecified organism: Secondary | ICD-10-CM | POA: Diagnosis not present

## 2023-05-01 DIAGNOSIS — I5033 Acute on chronic diastolic (congestive) heart failure: Secondary | ICD-10-CM | POA: Diagnosis not present

## 2023-05-01 DIAGNOSIS — R140 Abdominal distension (gaseous): Secondary | ICD-10-CM | POA: Diagnosis not present

## 2023-05-01 DIAGNOSIS — J9621 Acute and chronic respiratory failure with hypoxia: Secondary | ICD-10-CM | POA: Diagnosis not present

## 2023-05-01 LAB — HEPARIN LEVEL (UNFRACTIONATED): Heparin Unfractionated: >1.1 [IU]/mL — ABNORMAL HIGH (ref 0.30–0.70)

## 2023-05-01 LAB — RESPIRATORY PANEL BY PCR

## 2023-05-01 LAB — CBC
HCT: 36 % — ABNORMAL LOW (ref 39.0–52.0)
Hemoglobin: 11.6 g/dL — ABNORMAL LOW (ref 13.0–17.0)
MCH: 29.4 pg (ref 26.0–34.0)
MCHC: 32.2 g/dL (ref 30.0–36.0)
MCV: 91.4 fL (ref 80.0–100.0)
Platelets: 328 10*3/uL (ref 150–400)
RBC: 3.94 MIL/uL — ABNORMAL LOW (ref 4.22–5.81)
RDW: 17.7 % — ABNORMAL HIGH (ref 11.5–15.5)
WBC: 22 10*3/uL — ABNORMAL HIGH (ref 4.0–10.5)
nRBC: 0 % (ref 0.0–0.2)

## 2023-05-01 LAB — BASIC METABOLIC PANEL
Anion gap: 11 (ref 5–15)
BUN: 53 mg/dL — ABNORMAL HIGH (ref 8–23)
CO2: 22 mmol/L (ref 22–32)
Calcium: 9.1 mg/dL (ref 8.9–10.3)
Chloride: 106 mmol/L (ref 98–111)
Creatinine, Ser: 1.29 mg/dL — ABNORMAL HIGH (ref 0.61–1.24)
GFR, Estimated: 56 mL/min — ABNORMAL LOW (ref >60)
Glucose, Bld: 199 mg/dL — ABNORMAL HIGH (ref 70–99)
Potassium: 3.7 mmol/L (ref 3.5–5.1)
Sodium: 139 mmol/L (ref 135–145)

## 2023-05-01 LAB — APTT
aPTT: 68 s — ABNORMAL HIGH (ref 24–36)
aPTT: 60 s — ABNORMAL HIGH (ref 24–36)
aPTT: 69 s — ABNORMAL HIGH (ref 24–36)

## 2023-05-01 LAB — MRSA NEXT GEN BY PCR, NASAL: MRSA by PCR Next Gen: DETECTED — AB

## 2023-05-01 LAB — LACTIC ACID, PLASMA: Lactic Acid, Venous: 1.3 mmol/L (ref 0.5–1.9)

## 2023-05-01 MED ORDER — POLYETHYLENE GLYCOL 3350 17 G PO PACK
17.0000 g | PACK | Freq: Two times a day (BID) | ORAL | Status: DC
  Administered 2023-05-02 – 2023-05-09 (×10): 17 g via ORAL
  Filled 2023-05-01 (×12): qty 1

## 2023-05-01 MED ORDER — BISACODYL 10 MG RE SUPP
10.0000 mg | Freq: Once | RECTAL | Status: AC
Start: 2023-05-01 — End: 2023-05-01
  Administered 2023-05-01: 10 mg via RECTAL
  Filled 2023-05-01: qty 1

## 2023-05-01 MED ORDER — LACTULOSE 10 GM/15ML PO SOLN: 30.0000 g | Freq: Once | ORAL | Status: DC

## 2023-05-01 MED ORDER — LACTULOSE 10 GM/15ML PO SOLN
30.0000 g | Freq: Once | ORAL | Status: AC
  Administered 2023-05-01: 30 g via ORAL
  Filled 2023-05-01: qty 60

## 2023-05-01 MED ORDER — HEPARIN BOLUS VIA INFUSION
1300.0000 [IU] | Freq: Once | INTRAVENOUS | Status: AC
Start: 2023-05-01 — End: 2023-05-01
  Administered 2023-05-01: 1300 [IU] via INTRAVENOUS
  Filled 2023-05-01: qty 1300

## 2023-05-01 NOTE — ED Notes (Signed)
Update given to family at this time.

## 2023-05-01 NOTE — Consult Note (Signed)
Pharmacy Consult Note - Anticoagulation  Pharmacy Consult for heparin Indication: atrial fibrillation  PATIENT MEASUREMENTS: Height: 5\' 7"  (170.2 cm) Weight: 100 kg (220 lb 7.4 oz) IBW/kg (Calculated) : 66.1 HEPARIN DW (KG): 87.8  VITAL SIGNS: Temp: 97.7 F (36.5 C) (01/29 1930) Temp Source: Oral (01/29 1930) BP: 128/100 (01/30 0200) Pulse Rate: 107 (01/30 0200)  Recent Labs    04/29/23 1503 04/30/23 0339 04/30/23 1724 04/30/23 1724 05/01/23 0311  HGB 13.6 12.0*  --   --   --   HCT 41.8 37.6*  --   --   --   PLT 364 330  --   --   --   APTT  --   --  41*   < > 68*  LABPROT  --   --  20.2*  --   --   INR  --   --  1.7*  --   --   HEPARINUNFRC  --   --  >1.10*   < > >1.10*  CREATININE 0.93 1.35*  --   --   --   TROPONINIHS 17  --   --   --   --    < > = values in this interval not displayed.    Estimated Creatinine Clearance: 50 mL/min (A) (by C-G formula based on SCr of 1.35 mg/dL (H)).  PAST MEDICAL HISTORY: Past Medical History:  Diagnosis Date   Allergic rhinitis    Anxiety    Arthritis    Atrial fibrillation (HCC)    Chronic indwelling Foley catheter    COPD (chronic obstructive pulmonary disease) (HCC)    Cough    Diabetes (HCC)    Essential (primary) hypertension    Hemorrhoid    Hyperlipidemia    PAF (paroxysmal atrial fibrillation) (HCC)    Primary osteoarthritis, unspecified shoulder    Retention of urine, unspecified    Spinal stenosis    UTI (urinary tract infection)     ASSESSMENT: 78 y.o. male with PMH including COPD, HTN, dCHF, Afib is presenting with pneumonia and acute on chronic heart failure. Patient is on chronic anticoagulation with Eliquis per chart review. Last dose unknown, will order baseline heparin level to assess for false elevation. Pharmacy has been consulted to initiate and manage heparin intravenous infusion.  Pertinent medications: Eliquis 5 mg twice daily  Goal(s) of therapy: Heparin level 0.3 - 0.7 units/mL aPTT 66 -  102 seconds Monitor platelets by anticoagulation protocol: Yes   Baseline anticoagulation labs: Recent Labs    04/29/23 1503 04/30/23 0339 04/30/23 1724 05/01/23 0311  APTT  --   --  41* 68*  INR  --   --  1.7*  --   HGB 13.6 12.0*  --   --   PLT 364 330  --   --     Date Time aPTT/HL Rate/Comment 1/30     0311     68/> 1.10       1200 units/hr, therapeutic X 1    PLAN: 1/30 @ 0311:  aPTT = 68,  HL = > 1.10 - aPTT therapeutic X 1,  HL elevated from PTA Eliquis  - will continue pt on current rate and recheck aPTT in 8 hrs - will recheck HL on 1/31 with AM labs Continue to titrate by aPTT until heparin level and aPTT correlate and/or Eliquis washes out, then titrate by heparin level alone. Continue to monitor CBC daily while on heparin infusion.  Stella Encarnacion D Clinical Pharmacist 05/01/2023 3:57 AM

## 2023-05-01 NOTE — Consult Note (Signed)
Pharmacy Consult Note - Anticoagulation  Pharmacy Consult for heparin Indication: atrial fibrillation  PATIENT MEASUREMENTS: Height: 5\' 7"  (170.2 cm) Weight: 100 kg (220 lb 7.4 oz) IBW/kg (Calculated) : 66.1 HEPARIN DW (KG): 87.8  VITAL SIGNS: Temp: 97.7 F (36.5 C) (01/30 0842) Temp Source: Oral (01/30 0842) BP: 132/104 (01/30 1130) Pulse Rate: 117 (01/30 1130)  Recent Labs    04/29/23 1503 04/30/23 0339 04/30/23 1724 05/01/23 0311 05/01/23 0501 05/01/23 1129  HGB 13.6   < >  --   --  11.6*  --   HCT 41.8   < >  --   --  36.0*  --   PLT 364   < >  --   --  328  --   APTT  --    < > 41* 68*  --  60*  LABPROT  --   --  20.2*  --   --   --   INR  --   --  1.7*  --   --   --   HEPARINUNFRC  --    < > >1.10* >1.10*  --   --   CREATININE 0.93   < >  --   --  1.29*  --   TROPONINIHS 17  --   --   --   --   --    < > = values in this interval not displayed.    Estimated Creatinine Clearance: 52.3 mL/min (A) (by C-G formula based on SCr of 1.29 mg/dL (H)).  PAST MEDICAL HISTORY: Past Medical History:  Diagnosis Date   Allergic rhinitis    Anxiety    Arthritis    Atrial fibrillation (HCC)    Chronic indwelling Foley catheter    COPD (chronic obstructive pulmonary disease) (HCC)    Cough    Diabetes (HCC)    Essential (primary) hypertension    Hemorrhoid    Hyperlipidemia    PAF (paroxysmal atrial fibrillation) (HCC)    Primary osteoarthritis, unspecified shoulder    Retention of urine, unspecified    Spinal stenosis    UTI (urinary tract infection)     ASSESSMENT: 78 y.o. male with PMH including COPD, HTN, dCHF, Afib is presenting with pneumonia and acute on chronic heart failure. Patient is on chronic anticoagulation with Eliquis per chart review. Last dose of Eliquis was 1/28 at 1400, will order baseline heparin level to assess for false elevation. Pharmacy has been consulted to initiate and manage heparin intravenous infusion.  Pertinent medications: Eliquis  5 mg twice daily Last dose PTA was on 1/28 @ 1400  Goal(s) of therapy: Heparin level 0.3 - 0.7 units/mL aPTT 66 - 102 seconds Monitor platelets by anticoagulation protocol: Yes   Baseline anticoagulation labs: Recent Labs    04/29/23 1503 04/30/23 0339 04/30/23 1724 05/01/23 0311 05/01/23 0501 05/01/23 1129  APTT  --   --  41* 68*  --  60*  INR  --   --  1.7*  --   --   --   HGB 13.6 12.0*  --   --  11.6*  --   PLT 364 330  --   --  328  --     Date Time aPTT/HL Rate/Comment 1/30     0311    68/>1.10        1200 units/hr, therapeutic X 1 1/30 1129 60  Subtherapeutic    PLAN: Give 1300 units bolus x1; then increase rate of heparin infusion to 1400 units/hour. Check  aPTT in 8 hours, then daily once at least two levels are consecutively therapeutic. Heparin level with tomorrow AM labs Continue to monitor CBC daily while on heparin infusion.  Will M. Dareen Piano, PharmD Clinical Pharmacist 05/01/2023 12:13 PM

## 2023-05-01 NOTE — Consult Note (Signed)
Pharmacy Consult Note - Anticoagulation  Pharmacy Consult for heparin Indication: atrial fibrillation  PATIENT MEASUREMENTS: Height: 5\' 7"  (170.2 cm) Weight: 97.8 kg (215 lb 9.8 oz) IBW/kg (Calculated) : 66.1 HEPARIN DW (KG): 87.8  VITAL SIGNS: Temp: 97.6 F (36.4 C) (01/30 2100) Temp Source: Oral (01/30 1736) BP: 137/100 (01/30 1800) Pulse Rate: 54 (01/30 1800)  Recent Labs    04/29/23 1503 04/30/23 0339 04/30/23 1724 05/01/23 0311 05/01/23 0501 05/01/23 1129 05/01/23 2102  HGB 13.6   < >  --   --  11.6*  --   --   HCT 41.8   < >  --   --  36.0*  --   --   PLT 364   < >  --   --  328  --   --   APTT  --    < > 41* 68*  --    < > 69*  LABPROT  --   --  20.2*  --   --   --   --   INR  --   --  1.7*  --   --   --   --   HEPARINUNFRC  --    < > >1.10* >1.10*  --   --   --   CREATININE 0.93   < >  --   --  1.29*  --   --   TROPONINIHS 17  --   --   --   --   --   --    < > = values in this interval not displayed.    Estimated Creatinine Clearance: 51.8 mL/min (A) (by C-G formula based on SCr of 1.29 mg/dL (H)).  PAST MEDICAL HISTORY: Past Medical History:  Diagnosis Date   Allergic rhinitis    Anxiety    Arthritis    Atrial fibrillation (HCC)    Chronic indwelling Foley catheter    COPD (chronic obstructive pulmonary disease) (HCC)    Cough    Diabetes (HCC)    Essential (primary) hypertension    Hemorrhoid    Hyperlipidemia    PAF (paroxysmal atrial fibrillation) (HCC)    Primary osteoarthritis, unspecified shoulder    Retention of urine, unspecified    Spinal stenosis    UTI (urinary tract infection)     ASSESSMENT: 78 y.o. male with PMH including COPD, HTN, dCHF, Afib is presenting with pneumonia and acute on chronic heart failure. Patient is on chronic anticoagulation with Eliquis per chart review. Last dose of Eliquis was 1/28 at 1400, will order baseline heparin level to assess for false elevation. Pharmacy has been consulted to initiate and manage  heparin intravenous infusion.  Pertinent medications: Eliquis 5 mg twice daily Last dose PTA was on 1/28 @ 1400  Goal(s) of therapy: Heparin level 0.3 - 0.7 units/mL aPTT 66 - 102 seconds Monitor platelets by anticoagulation protocol: Yes   Baseline anticoagulation labs: Recent Labs    04/29/23 1503 04/30/23 0339 04/30/23 1724 04/30/23 1724 05/01/23 0311 05/01/23 0501 05/01/23 1129 05/01/23 2102  APTT  --   --  41*   < > 68*  --  60* 69*  INR  --   --  1.7*  --   --   --   --   --   HGB 13.6 12.0*  --   --   --  11.6*  --   --   PLT 364 330  --   --   --  328  --   --    < > =  values in this interval not displayed.    Date Time aPTT/HL Rate/Comment 1/30     0311    68/>1.10        1200 units/hr, therapeutic X 1 1/30 1129 60  Subtherapeutic 1/30 2102 69  Therapeutic.     PLAN: aPTT is therapeutic. Will continue heparin at 1400 units/hr. Recheck aPTT/HL/CBC in 8 hours. Switch to heparin level monitor once aPTT and heparin level correlate.   Paschal Dopp, PharmD, BCPS Clinical Pharmacist 05/01/2023 9:46 PM

## 2023-05-01 NOTE — Progress Notes (Signed)
[facility] SURGICAL PROGRESS NOTE (cpt [phone])  Hospital Day(s): 2.   Interval History: Patient seen and examined, no acute events or new complaints overnight. Patient much more alert and oriented today. He denies abdominal pain. He reports his abdomen has been distended "for some time." No abdominal pain, nausea, emesis. He is unsure when his last BM was. Continues to have leukocytosis; WBC 22.0K. Hgb to 11.6; stable. Renal function slightly improved; sCr - 1.29; UO - unmeasured. No electrolyte derangements. Venous lactate normal this morning; 1.3. KUB continues to show dilation of the colon with stool burden.   Review of Systems:  Constitutional: denies fever, chills  HEENT: denies cough or congestion  Respiratory: denies any shortness of breath  Cardiovascular: denies chest pain or palpitations  Gastrointestinal: + distension, denies abdominal pain, N/V Genitourinary: denies burning with urination or urinary frequency Musculoskeletal: denies pain, decreased motor or sensation   Vital signs in last 24 hours: [min-max] current  Temp:  [97.7 F (36.5 C)] 97.7 F (36.5 C) (01/29 1930) Pulse Rate:  [25-114] 25 (01/30 0630) Resp:  [17-28] 20 (01/30 0630) BP: (101-138)/(84-107) 127/94 (01/30 0630) SpO2:  [91 %-100 %] 95 % (01/30 0630)     Height: 5\' 7"  (170.2 cm) Weight: 100 kg BMI (Calculated): 34.52   Intake/Output last 2 shifts:  01/29 0701 - 01/30 0700 In: 400 [IV Piggyback:400] Out: -    Physical Exam:  Constitutional: alert, cooperative and no distress  HENT: normocephalic without obvious abnormality  Eyes: PERRL, EOM's grossly intact and symmetric  Respiratory: Accessory muscle use, on [place] Cardiovascular: tachycardic and sinus rhythm  Gastrointestinal: soft, non-tender, remains markedly distended and tympanic, no rebound/guarding. He is certainly without peritonitis  Musculoskeletal: no edema or wounds, motor and sensation grossly intact, NT    Labs:      Latest Ref Rng & Units 05/01/2023    5:01 AM 04/30/2023    3:39 AM 04/29/2023    3:03 PM  CBC  WBC 4.0 - 10.5 K/uL 22.0  21.8  17.2   Hemoglobin 13.0 - 17.0 g/dL 78.2  95.6  21.3   Hematocrit 39.0 - 52.0 % 36.0  37.6  41.8   Platelets 150 - 400 K/uL 328  330  364       Latest Ref Rng & Units 05/01/2023    5:01 AM 04/30/2023    3:39 AM 04/29/2023    3:03 PM  CMP  Glucose 70 - 99 mg/dL 086  578  469   BUN 8 - 23 mg/dL 53  42  29   Creatinine 0.61 - 1.24 mg/dL 6.29  5.28  4.13   Sodium 135 - 145 mmol/L 139  136  138   Potassium 3.5 - 5.1 mmol/L 3.7  5.2  4.5   Chloride 98 - 111 mmol/L 106  104  99   CO2 22 - 32 mmol/L 22  19  21    Calcium 8.9 - 10.3 mg/dL 9.1  8.9  9.6     Imaging studies  KUB (05/01/2023) personally reviewed with significant stool burden and similar appearing colonic distension, and radiologist report reviewed below:  IMPRESSION: 1. Diffuse gaseous distention in the bowel, predominantly involving the colon. Large volume of stool in the rectum. Findings could suggest colonic ileus, or could reflect distal obstruction secondary to fecal impaction in the rectum. Further clinical evaluation is recommended.   Assessment/Plan: (ICD-10's: K56.7) 78 y.o. malepresenting with SOB found to have possible multifocal PNA and/or COPD exacerbation also noted to have  significant abdominal distension and marked stool burden, complicated by numerous comorbidities and physical deconditioning               - Again, from an abdominal perspective, he is markedly distended but does not appear overtly tender. Suspect his abdominal changes are secondary to severe constipation and/or ileus secondary to acute medical illness (ie: PNA). SBO is possible given previous surgical history but least likely at this moment. - No role for NGT unless develops significant nausea/emesis as his primary distension is colonic and NGT will not decompress this.  - No indication for urgent surgical  intervention - He needs aggressive bowel regimen, okay to give PO and enema as needed - Would not do more than CLD unless distension improves             - Monitor abdominal examination; on-going bowel function              - Serial KUB as needed - Pain control prn; limit narcotics - Antiemetics prn - Monitor leukocytosis; suspect secondary to PNA - Monitor renal function; slightly improved - Work with therapies as feasible - Further management per primary service; we will follow    All of the above findings and recommendations were discussed with the patient, and the medical team, and all of patient's questions were answered to his expressed satisfaction.   -- Lynden Oxford, PA-C [facility] 05/01/2023, 7:26 AM M-F: 7am - 4pm

## 2023-05-01 NOTE — Progress Notes (Signed)
PT Cancellation/Screen Note  Patient Details Name: Raymond Hays MRN: [id number] DOB: [birth year]   Cancelled Treatment:    Reason Eval/Treat Not Completed: PT screened, no needs identified, will sign off Chart reviewed, spoke with pt in room.  Pt states that he is at his baseline: w/c bound with dependent lift for transfers, states he need bed pan for BMs and that his legs have "been shot for a long time."  Pt was able to do some minimal assist with LEs to push up in bed after PT and nursing assisted with a dry gown and repositioning in bed.  Pt in agreement that he does not need further PT as this point since he is functionally at his normal.  Will complete PT orders at this time, will need new if acute PT concerns arise.    Raymond Hays, DPT 05/01/2023, 6:17 PM

## 2023-05-01 NOTE — ED Notes (Signed)
Patient noted to have BM. Patient cleaned and new brief placed. PT repositioned in bed.

## 2023-05-01 NOTE — Progress Notes (Signed)
NEUROLOGY CONSULT FOLLOW UP NOTE   Date of service: May 01, 2023 Patient Name: Raymond Hays MRN:  [id number] DOB:  03-Jun-1945  Brief HPI  Raymond Hays is a 78 y.o. male who presented with shortness of breath and was found to have an episode of unresponsiveness yesterday morning.  He was transiently rapidly improved.   Interval Hx/subjective   No further episodes, appears more aware today. Vitals   Vitals:   05/01/23 1630 05/01/23 1700 05/01/23 1736 05/01/23 1800  BP: (!) 107/58 121/87  (!) 137/100  Pulse:  (!) 104  (!) 54  Resp: 19 18  12   Temp:   97.7 F (36.5 C)   TempSrc:   Oral   SpO2: 94% 95%  93%  Weight:      Height:         Body mass index is 34.53 kg/m.  Physical Exam   Constitutional: Appears well-developed and well-nourished.   Neurologic Examination   Gen: in bed, NAD  Neuro: MS: Awake, alert, oriented CN: EOMI, face symmetric Motor: He does have some limitations due to joint issues, but is able to hold both arms aloft against gravity, right leg is somewhat limited due to swelling, but able to lift the left leg will Sensory: Intact to light touch Labs and Diagnostic Imaging   CBC:  Recent Labs  Lab 04/30/23 0339 05/01/23 0501  WBC 21.8* 22.0*  HGB 12.0* 11.6*  HCT 37.6* 36.0*  MCV 90.8 91.4  PLT 330 328    Basic Metabolic Panel:  Lab Results  Component Value Date   NA 139 05/01/2023   K 3.7 05/01/2023   CO2 22 05/01/2023   GLUCOSE 199 (H) 05/01/2023   BUN 53 (H) 05/01/2023   CREATININE 1.29 (H) 05/01/2023   CALCIUM 9.1 05/01/2023   GFRNONAA 56 (L) 05/01/2023   GFRAA 55 (L) 12/15/2019   Lipid Panel: No results found for: "LDLCALC" HgbA1c:  Lab Results  Component Value Date   HGBA1C 6.8 (H) 03/16/2021   Urine Drug Screen: No results found for: "LABOPIA", "COCAINSCRNUR", "LABBENZ", "AMPHETMU", "THCU", "LABBARB"   Imaging(Personally reviewed): MRI brain-negative   Impression    Raymond Hays is a 78 y.o. male with a transient  episode of unresponsiveness earlier today.  The etiology of this is unclear, his ABG was mildly hypoxemic, but his O2 sats never seemed to be very low.  I think TIA would be relatively unlikely, but a basilar TIA could be theoretically possible.  Seizure would be another consideration, though no clear evidence supporting this.  At the current time, I would favor getting an EEG, and continuing heparin in case this was TIA.   Most toxic/metabolic etiologies would not recover so quickly, but it is possible he was simply lethargic and took a while to come around.    Recommendations  EEG is pending Neurology will follow-up EEG, but if negative I would think further testing would be low yield unless he were to have further symptoms. ______________________________________________________________________   Thank you for the opportunity to take part in the care of this patient. If you have any further questions, please contact the neurology consultation team on call. Updated oncall schedule is listed on AMION.  Signed,  Ritta Slot Neurohospitalist

## 2023-05-01 NOTE — ED Notes (Signed)
Pt disimpacted by Everlene Farrier, MD. Pt cleaned and new chucks placed.

## 2023-05-01 NOTE — Progress Notes (Signed)
Progress Note   Patient: Raymond Hays ZOX:[id number] DOB: 11/30/1945 DOA: 04/29/2023     2 DOS: the patient was seen and examined on 05/01/2023   Brief hospital course: 78 year old man past medical history of COPD on chronic 2 L of oxygen, hypertension, hyperlipidemia, diastolic congestive heart failure, suprapubic Foley catheter, neurogenic bladder, atrial fibrillation on Eliquis, spinal stenosis, wheelchair-bound, Parkinson's disease, hypertension, diet-controlled diabetes, hypothyroidism, gout, depression, anxiety, BPH, anemia who presented with altered mental status and shortness of breath.  He was placed on BiPAP given Lasix and antibiotics and steroids.  Surgical consultation for distended abdomen likely secondary to constipation and/or ileus.  1/29.  Patient had altered mental status this morning MRI of the brain was done which showed no acute intracranial abnormality.  Patient off BiPAP and on oxygen upon reevaluation. 1/30.  Abdomen still distended.  Patient not having abdominal pain.  Rectal exam did not show impaction but did have stool in the rectal vault.  Assessment and Plan: * Acute on chronic respiratory failure with hypoxia (HCC) Came in with respiratory distress and tachypnea placed on BiPAP.  Chronically wears 2 L of oxygen.  Now on 4 L.  COPD exacerbation (HCC) Continue Solu-Medrol and nebulizer treatments.  Abdominal distension Could be ileus or severe constipation.  Will advance to clear liquid diet.  Abdomen more distended but not having any pain.  Will increase MiraLAX to twice daily.  Given a dose of lactulose today and will repeat later this afternoon.  Rectal exam did not show stool impaction.  Multifocal pneumonia Bilateral pneumonia seen on CT scan.  Currently on vancomycin and Maxipime.  Sepsis (HCC) Present on admission with leukocytosis tachycardia and tachypnea.  No fluids with CHF exacerbation.  Acute on chronic diastolic CHF (congestive heart failure)  (HCC) Continue once a day Lasix for right now.  Last EF 55%.  Acute metabolic encephalopathy MRI of the brain negative for stroke.  Mental status improved from yesterday.  Paroxysmal atrial fibrillation with rapid ventricular response (HCC) On Cardizem drip and heparin drip.  Holding Eliquis currently.  Essential hypertension On IV Lasix and Cardizem drip.  Hypothyroidism Continue levothyroxine  Parkinson disease (HCC) Continue Sinemet  HLD (hyperlipidemia) Continue Lipitor  Depression with anxiety Continue home medication  Obesity (BMI 30-39.9) BMI 34.53  Pressure injury of skin Multiple areas see full description below.  Present on admission.        Subjective: Patient breathing little bit better than yesterday.  Abdomen more distended but not having any abdominal pain.  No vomiting.  Patient states he has not eaten in days.  Physical Exam: Vitals:   05/01/23 1226 05/01/23 1230 05/01/23 1300 05/01/23 1330  BP:  (!) 123/103 (!) 112/94 (!) 130/103  Pulse:  (!) 107 (!) 117 (!) 137  Resp:  (!) 21 16 (!) 22  Temp: 97.6 F (36.4 C)     TempSrc: Axillary     SpO2:  95% (!) 89% 91%  Weight:      Height:       Physical Exam HENT:     Head: Normocephalic.  Eyes:     General: Lids are normal.     Conjunctiva/sclera: Conjunctivae normal.  Cardiovascular:     Rate and Rhythm: Normal rate and regular rhythm.     Heart sounds: Normal heart sounds, S1 normal and S2 normal.  Pulmonary:     Breath sounds: No decreased breath sounds, wheezing, rhonchi or rales.  Abdominal:     General: There is distension.  Palpations: Abdomen is soft.     Tenderness: There is no abdominal tenderness.  Musculoskeletal:     Right lower leg: No swelling.     Left lower leg: No swelling.  Skin:    General: Skin is warm.     Findings: No rash.  Neurological:     Mental Status: He is alert.     Data Reviewed: Creatinine 1.29, white blood cell count 22, hemoglobin 11.6,  platelet count 328  Family Communication: Spoke with son and his wife on the phone.  He wants to be the primary contact.  Disposition: Status is: Inpatient Remains inpatient appropriate because: Need to get patient to have bowel movements.  Currently on clear liquid diet.  Planned Discharge Destination: Long-term care    Time spent: 28 minutes  Author: Alford Highland, MD 05/01/2023 2:00 PM  For on call review [email or web address].

## 2023-05-02 ENCOUNTER — Ambulatory Visit: Payer: No Typology Code available for payment source

## 2023-05-02 DIAGNOSIS — J441 Chronic obstructive pulmonary disease with (acute) exacerbation: Secondary | ICD-10-CM | POA: Diagnosis not present

## 2023-05-02 DIAGNOSIS — G9340 Encephalopathy, unspecified: Secondary | ICD-10-CM | POA: Diagnosis not present

## 2023-05-02 DIAGNOSIS — J189 Pneumonia, unspecified organism: Secondary | ICD-10-CM | POA: Diagnosis not present

## 2023-05-02 DIAGNOSIS — R140 Abdominal distension (gaseous): Secondary | ICD-10-CM | POA: Diagnosis not present

## 2023-05-02 DIAGNOSIS — A419 Sepsis, unspecified organism: Principal | ICD-10-CM

## 2023-05-02 DIAGNOSIS — J9621 Acute and chronic respiratory failure with hypoxia: Secondary | ICD-10-CM | POA: Diagnosis not present

## 2023-05-02 DIAGNOSIS — R6520 Severe sepsis without septic shock: Secondary | ICD-10-CM

## 2023-05-02 LAB — CBC
HCT: 35.3 % — ABNORMAL LOW (ref 39.0–52.0)
Hemoglobin: 11.6 g/dL — ABNORMAL LOW (ref 13.0–17.0)
MCH: 29.3 pg (ref 26.0–34.0)
MCHC: 32.9 g/dL (ref 30.0–36.0)
MCV: 89.1 fL (ref 80.0–100.0)
Platelets: 346 10*3/uL (ref 150–400)
RBC: 3.96 MIL/uL — ABNORMAL LOW (ref 4.22–5.81)
RDW: 17.5 % — ABNORMAL HIGH (ref 11.5–15.5)
WBC: 12.9 10*3/uL — ABNORMAL HIGH (ref 4.0–10.5)
nRBC: 0.2 % (ref 0.0–0.2)

## 2023-05-02 LAB — HEPARIN LEVEL (UNFRACTIONATED): Heparin Unfractionated: >1.1 [IU]/mL — ABNORMAL HIGH (ref 0.30–0.70)

## 2023-05-02 LAB — APTT: aPTT: 101 s — ABNORMAL HIGH (ref 24–36)

## 2023-05-02 MED ORDER — DILTIAZEM HCL ER COATED BEADS 120 MG PO CP24
240.0000 mg | ORAL_CAPSULE | Freq: Every day | ORAL | Status: DC
  Administered 2023-05-02 – 2023-05-03 (×2): 240 mg via ORAL
  Filled 2023-05-02 (×2): qty 2

## 2023-05-02 MED ORDER — LACTULOSE 10 GM/15ML PO SOLN
30.0000 g | Freq: Every day | ORAL | Status: DC
  Administered 2023-05-02: 30 g via ORAL
  Filled 2023-05-02: qty 60

## 2023-05-02 MED ORDER — UMECLIDINIUM BROMIDE 62.5 MCG/ACT IN AEPB
1.0000 | INHALATION_SPRAY | Freq: Every day | RESPIRATORY_TRACT | Status: DC
  Administered 2023-05-02 – 2023-05-08 (×7): 1 via RESPIRATORY_TRACT
  Filled 2023-05-02: qty 7

## 2023-05-02 MED ORDER — IPRATROPIUM-ALBUTEROL 0.5-2.5 (3) MG/3ML IN SOLN
3.0000 mL | Freq: Four times a day (QID) | RESPIRATORY_TRACT | Status: DC
  Administered 2023-05-02 – 2023-05-03 (×3): 3 mL via RESPIRATORY_TRACT
  Filled 2023-05-02 (×3): qty 3

## 2023-05-02 MED ORDER — ENSURE ENLIVE PO LIQD
237.0000 mL | Freq: Two times a day (BID) | ORAL | Status: DC
  Administered 2023-05-02 – 2023-05-09 (×14): 237 mL via ORAL

## 2023-05-02 MED ORDER — BISACODYL 10 MG RE SUPP
10.0000 mg | Freq: Once | RECTAL | Status: AC
  Administered 2023-05-02: 10 mg via RECTAL
  Filled 2023-05-02: qty 1

## 2023-05-02 MED ORDER — FLUTICASONE FUROATE-VILANTEROL 200-25 MCG/ACT IN AEPB
1.0000 | INHALATION_SPRAY | Freq: Every day | RESPIRATORY_TRACT | Status: DC
  Administered 2023-05-02 – 2023-05-09 (×8): 1 via RESPIRATORY_TRACT
  Filled 2023-05-02: qty 28

## 2023-05-02 NOTE — Progress Notes (Addendum)
CCMD called and reported a 15 beats of non sustain vtach. Pt stable on assessment. VSS. NP Jon Billings made aware. Will continue to monitor.  Update 0607: No new order place. Will continue to monitor.

## 2023-05-02 NOTE — Plan of Care (Signed)
  Problem: Education: Goal: Knowledge of General Education information will improve Description: Including pain rating scale, medication(s)/side effects and non-pharmacologic comfort measures Outcome: Progressing   Problem: Clinical Measurements: Goal: Ability to maintain clinical measurements within normal limits will improve Outcome: Progressing   Problem: Clinical Measurements: Goal: Respiratory complications will improve Outcome: Progressing   Problem: Clinical Measurements: Goal: Cardiovascular complication will be avoided Outcome: Progressing   Problem: Nutrition: Goal: Adequate nutrition will be maintained Outcome: Progressing   Problem: Pain Managment: Goal: General experience of comfort will improve and/or be controlled Outcome: Progressing   Problem: Safety: Goal: Ability to remain free from injury will improve Outcome: Progressing

## 2023-05-02 NOTE — Consult Note (Signed)
Pharmacy Consult Note - Anticoagulation  Pharmacy Consult for heparin Indication: atrial fibrillation  PATIENT MEASUREMENTS: Height: 5\' 7"  (170.2 cm) Weight: 98 kg (216 lb 0.8 oz) IBW/kg (Calculated) : 66.1 HEPARIN DW (KG): 87.8  VITAL SIGNS: Temp: 97.6 F (36.4 C) (01/31 0500) Temp Source: Oral (01/31 0500) BP: 122/81 (01/31 0531) Pulse Rate: 87 (01/31 0531)  Recent Labs    04/29/23 1503 04/30/23 0339 04/30/23 1724 05/01/23 0311 05/01/23 0501 05/01/23 1129 05/02/23 0540  HGB 13.6   < >  --   --  11.6*  --  11.6*  HCT 41.8   < >  --   --  36.0*  --  35.3*  PLT 364   < >  --   --  328  --  346  APTT  --   --  41*   < >  --    < > 101*  LABPROT  --   --  20.2*  --   --   --   --   INR  --   --  1.7*  --   --   --   --   HEPARINUNFRC  --   --  >1.10*   < >  --   --  >1.10*  CREATININE 0.93   < >  --   --  1.29*  --   --   TROPONINIHS 17  --   --   --   --   --   --    < > = values in this interval not displayed.    Estimated Creatinine Clearance: 51.8 mL/min (A) (by C-G formula based on SCr of 1.29 mg/dL (H)).  PAST MEDICAL HISTORY: Past Medical History:  Diagnosis Date   Allergic rhinitis    Anxiety    Arthritis    Atrial fibrillation (HCC)    Chronic indwelling Foley catheter    COPD (chronic obstructive pulmonary disease) (HCC)    Cough    Diabetes (HCC)    Essential (primary) hypertension    Hemorrhoid    Hyperlipidemia    PAF (paroxysmal atrial fibrillation) (HCC)    Primary osteoarthritis, unspecified shoulder    Retention of urine, unspecified    Spinal stenosis    UTI (urinary tract infection)     ASSESSMENT: 78 y.o. male with PMH including COPD, HTN, dCHF, Afib is presenting with pneumonia and acute on chronic heart failure. Patient is on chronic anticoagulation with Eliquis per chart review. Last dose of Eliquis was 1/28 at 1400, will order baseline heparin level to assess for false elevation. Pharmacy has been consulted to initiate and manage  heparin intravenous infusion.  Pertinent medications: Eliquis 5 mg twice daily Last dose PTA was on 1/28 @ 1400  Goal(s) of therapy: Heparin level 0.3 - 0.7 units/mL aPTT 66 - 102 seconds Monitor platelets by anticoagulation protocol: Yes   Baseline anticoagulation labs: Recent Labs    04/30/23 0339 04/30/23 1724 05/01/23 0311 05/01/23 0501 05/01/23 1129 05/01/23 2102 05/02/23 0540  APTT  --  41*   < >  --  60* 69* 101*  INR  --  1.7*  --   --   --   --   --   HGB 12.0*  --   --  11.6*  --   --  11.6*  PLT 330  --   --  328  --   --  346   < > = values in this interval not displayed.  Date Time aPTT/HL Rate/Comment 1/30     0311    68/>1.10        1200 units/hr, therapeutic X 1 1/30 1129 60  Subtherapeutic 1/30 2102 69  Therapeutic.  1/31     0540   101 />1.10       Therapeutic X 2     PLAN: 1/31 @ 0540:  aPTT = 101,   HL = > 1.10 - aPTT therapeutic X 2,  HL still elevated from Eliquis PTA - Will continue pt on current rate and recheck aPTT and HL on 1/31 with AM labs.  - Switch to heparin level monitor once aPTT and heparin level correlate.   Shynia Daleo D Clinical Pharmacist 05/02/2023 6:46 AM

## 2023-05-02 NOTE — Assessment & Plan Note (Signed)
Present on admission with leukocytosis tachycardia and tachypnea and multifocal pneumonia and acute on chronic respiratory failure.

## 2023-05-02 NOTE — Progress Notes (Signed)
Pharmacy Antibiotic Note  Raymond Hays is a 78 y.o. male admitted on 04/29/2023 with pneumonia. PMH includes COPD (on 2 L O2 chronically), suprapubic Foley catheter, ESBL UTI, neurogenic bladder. Pt has increased oxygen requirements, initially requiring BiPAP and leukocytosis. Pharmacy has been consulted for cefepime and vancomycin dosing. Already initiated on cefepime and vancomycin while in ED.  Update 1/31: Renal function declined since admission (Scr 0.93>>1.29)  Plan: Day#4 of ABx Continue cefepime 2 g IV q12H Continue vancomycin dosing to 1000 mg IV q24H eAUC 515, Cmax 33, Cmin 13.5 Scr 1.35, IBW, Vd 0.5 L/kg MRSA PCR - positive Pan to order Vancomycin level once at steady state Monitor renal function to assess for any necessary antibiotic dosing changes.  Height: 5\' 7"  (170.2 cm) Weight: 98 kg (216 lb 0.8 oz) IBW/kg (Calculated) : 66.1  Temp (24hrs), Avg:97.6 F (36.4 C), Min:97.6 F (36.4 C), Max:97.8 F (36.6 C)  Recent Labs  Lab 04/29/23 1503 04/29/23 1528 04/29/23 2314 04/30/23 0339 05/01/23 0501 05/02/23 0540  WBC 17.2*  --   --  21.8* 22.0* 12.9*  CREATININE 0.93  --   --  1.35* 1.29*  --   LATICACIDVEN  --  2.0* 2.7*  --  1.3  --     Estimated Creatinine Clearance: 51.8 mL/min (A) (by C-G formula based on SCr of 1.29 mg/dL (H)).    No Known Allergies  Antimicrobials this admission: Ceftriaxone, Azithro 1/28 x 1 Cefepime 1/28 >>  Vancomycin 1/28  >>   Dose adjustments this admission: Cefepime 2 g IV q8H >> 2 g IV q12H Vancomycin 1250 mg IV q24H >> 1000 mg IV q12H  Microbiology results: 1/28 BCx: NGTD 1/29 MRSA PCR: ordered  Thank you for allowing pharmacy to be a part of this patient's care.  Damond Borchers Rodriguez-Guzman PharmD, BCPS 05/02/2023 9:24 AM

## 2023-05-02 NOTE — Procedures (Signed)
History: 78 year old who had a transient episode of unresponsiveness  Sedation: None  Patient State: Awake and asleep   Technique: This EEG was acquired with electrodes placed according to the International 10-20 electrode system (including Fp1, Fp2, F3, F4, C3, C4, P3, P4, O1, O2, T3, T4, T5, T6, A1, A2, Fz, Cz, Pz). The following electrodes were missing or displaced: none.   Background: There is a posterior dominant rhythm of 7 to 8 Hz which is fairly well-sustained.  In addition, there is generalized irregular delta and theta range activities intruding into the background throughout the study.  With drowsiness there is an increase in this activity, but well-formed sleep is not seen.  Photic stimulation: Physiologic driving is not performed  EEG Abnormalities: 1) generalized irregular slow activity 2) slow posterior dominant rhythm  Clinical Interpretation: This EEG is consistent with a generalized nonspecific cerebral dysfunction (encephalopathy). There was no seizure or seizure predisposition recorded on this study. Please note that lack of epileptiform activity on EEG does not preclude the possibility of epilepsy.   Ritta Slot, MD [facility] [phone]  If 7pm- 7am, please page neurology on call as listed in AMION.

## 2023-05-02 NOTE — Progress Notes (Signed)
 Eeg done

## 2023-05-02 NOTE — Evaluation (Signed)
Occupational Therapy Evaluation Patient Details Name: Raymond Hays MRN: [id number] DOB: [birth year] Today's Date: 05/02/2023   History of Present Illness Pt is a 78 y.o. male admitted with abdominal distention, sepsis, acute metabolic encephalopathy with AMS. PMHx: COPD on chronic 2 L of oxygen, HTN, hyperlipidemia, diastolic CHF, suprapubic Foley catheter, neurogenic bladder, atrial fibrillation on Eliquis, spinal stenosis, wheelchair-bound, Parkinson's disease, diet-controlled diabetes, hypothyroidism, gout, depression, anxiety, BPH, anemia, & pressure injuries   Clinical Impression   Pt admitted from [facility] He reports requiring assist for all ADLs (TOTAL A for dressing and bathing, able to perform self-feeding/grooming tasks with minA for opening containers and setup of items), and transfers using a sit to stand lift from bed to wc. On OT eval, he was able to complete grooming tasks bed level with setup (assist for opening toothbrush and toothpaste). Pt grateful for assistance, appears to be at baseline for ADL and mobility functionality. Pt does not demo acute skilled OT needs at this time, will complete orders and sign off.       If plan is discharge home, recommend the following: Two people to help with walking and/or transfers;Two people to help with bathing/dressing/bathroom;Assistance with cooking/housework;Assistance with feeding;Direct supervision/assist for medications management;Direct supervision/assist for financial management;Assist for transportation;Help with stairs or ramp for entrance    Functional Status Assessment  Patient has not had a recent decline in their functional status  Equipment Recommendations  None recommended by OT       Precautions / Restrictions Precautions Precautions: Fall Precaution Comments: hoyer Restrictions Weight Bearing Restrictions Per Provider Order: No      Mobility Bed Mobility               General bed mobility comments:  NT    Transfers                   General transfer comment: NT          ADL either performed or assessed with clinical judgement   ADL Overall ADL's : Needs assistance/impaired Eating/Feeding: Set up;Bed level   Grooming: Wash/dry hands;Wash/dry face;Oral care;Minimal assistance;Bed level Grooming Details (indicate cue type and reason): pt requests to perform oral care and grooming tasks bed level                               General ADL Comments: Pt at baseline for functional performance.      Pertinent Vitals/Pain Pain Assessment Pain Assessment: No/denies pain     Extremity/Trunk Assessment Upper Extremity Assessment Upper Extremity Assessment: Generalized weakness   Lower Extremity Assessment Lower Extremity Assessment: Generalized weakness       Communication Communication Communication: No apparent difficulties   Cognition Arousal: Alert Behavior During Therapy: WFL for tasks assessed/performed Overall Cognitive Status: Within Functional Limits for tasks assessed                                       General Comments  heels floated in bed            Home Living Family/patient expects to be discharged to:: Skilled nursing facility                                 Additional Comments: Pt is resident at [facility].  Prior Functioning/Environment Prior Level of Function : Needs assist             Mobility Comments: Pt reports staff at facility transfers him using a lift from bed to wc. Non-ambulatory ADLs Comments: Pt is dependent for all ADLs except self-feeding (set up A). Staff give him a bath in shower 2x/wk, sponge bath other times. Incontinent of bowel & bladder (foley catheter, uses bed pan at facility).         AM-PAC OT "6 Clicks" Daily Activity     Outcome Measure Help from another person eating meals?: A Little Help from another person taking care of personal  grooming?: A Little Help from another person toileting, which includes using toliet, bedpan, or urinal?: Total Help from another person bathing (including washing, rinsing, drying)?: Total Help from another person to put on and taking off regular upper body clothing?: Total Help from another person to put on and taking off regular lower body clothing?: Total 6 Click Score: 10   End of Session Equipment Utilized During Treatment: Oxygen Nurse Communication: Mobility status;Need for lift equipment  Activity Tolerance: Patient tolerated treatment well Patient left: in bed;with call bell/phone within reach;with bed alarm set  OT Visit Diagnosis: Muscle weakness (generalized) (M62.81);Other abnormalities of gait and mobility (R26.89)                Time: 5784-6962 OT Time Calculation (min): 24 min Charges:  OT General Charges $OT Visit: 1 Visit OT Evaluation $OT Eval Low Complexity: 1 Low OT Treatments $Self Care/Home Management : 8-22 mins  Saul Fabiano L. Naoma Boxell, OTR/L  05/02/23, 9:22 AM

## 2023-05-02 NOTE — Progress Notes (Signed)
  Interdisciplinary Goals of Care Family Meeting   Date carried out: 05/02/2023  Location of the meeting: Unit  Member's involved: Steward Drone NP patient Malachy Moan RN  Durable Power of Attorney or Environmental health practitioner: patient    Discussion: We discussed goals of care for [employer] .  Patient told nurse he wanted to change his code status and have everything done. Bedside patient is alert and oriented.  Discussed reasoning of change from DNR to full code. Patient states he has son and grand son that he wants to be around as long as he can.  Attempted review of chance of survival as very low but patient still wants to "give it a try"  Code status:   Code Status: Full Code   Disposition: SNF/LTAC  Time spent for the meeting: 10    Manuela Schwartz, NP  05/02/2023, 8:58 PM

## 2023-05-02 NOTE — Progress Notes (Signed)
Progress Note   Patient: Raymond Hays ZOX:[id number] DOB: 08-20-1945 DOA: 04/29/2023     3 DOS: the patient was seen and examined on 05/02/2023   Brief hospital course: 78 year old man past medical history of COPD on chronic 2 L of oxygen, hypertension, hyperlipidemia, diastolic congestive heart failure, suprapubic Foley catheter, neurogenic bladder, atrial fibrillation on Eliquis, spinal stenosis, wheelchair-bound, Parkinson's disease, hypertension, diet-controlled diabetes, hypothyroidism, gout, depression, anxiety, BPH, anemia who presented with altered mental status and shortness of breath.  He was placed on BiPAP given Lasix and antibiotics and steroids.  Surgical consultation for distended abdomen likely secondary to constipation and/or ileus.  1/29.  Patient had altered mental status this morning MRI of the brain was done which showed no acute intracranial abnormality.  Patient off BiPAP and on oxygen upon reevaluation. 1/30.  Abdomen still distended.  Patient not having abdominal pain.  Surgeon did a rectal exam in the evening and disimpacted and had a lot of gas that came out.  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 1/31.  Abdomen less distended today.  Patient did have a bowel movement this morning.  Continue enemas in the evening will give a suppository.  Continue lactulose and MiraLAX.  Assessment and Plan: * Acute on chronic respiratory failure with hypoxia (HCC) Came in with respiratory distress and tachypnea placed on BiPAP.  Chronically wears 2 L of oxygen.  Now on 3 L.  COPD exacerbation (HCC) Continue Solu-Medrol and nebulizer treatments.  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  Will advance to full liquid diet.  Abdomen less distended than yesterday.  Continue MiraLAX to twice daily.  Continue lactulose daily.  Continue enema at night.  Will also give a Dulcolax suppository now.  If patient does not improve may need neostigmine.  Surgical team does not think he would survive a  surgical procedure.  Multifocal pneumonia Bilateral pneumonia seen on CT scan.  Currently on vancomycin and Maxipime.  White blood cell count trending better.  Severe sepsis (HCC) Present on admission with leukocytosis tachycardia and tachypnea and multifocal pneumonia and acute on chronic respiratory failure.  Acute on chronic diastolic CHF (congestive heart failure) (HCC) Hold Lasix.  Appears euvolemic.  Last EF 55%.  Acute metabolic encephalopathy MRI of the brain negative for stroke.  Mental status improved from admission.  Paroxysmal atrial fibrillation with rapid ventricular response (HCC) On Cardizem drip and heparin drip.  Holding Eliquis currently.  Will add back Cardizem orally and try to get off Cardizem drip.  Essential hypertension Restarting oral Cardizem  Hypothyroidism Continue levothyroxine  Parkinson disease (HCC) Continue Sinemet  HLD (hyperlipidemia) Continue Lipitor  Depression with anxiety Continue home medication  Obesity (BMI 30-39.9) BMI 33.84  Pressure injury of skin Multiple areas see full description below.  Present on admission.        Subjective: Abdomen less distended this morning.  Patient did have a bowel movement this morning.  Advance to full liquid this morning.  Came in with respiratory distress COPD exacerbation and distended abdomen  Physical Exam: Vitals:   05/02/23 0500 05/02/23 0531 05/02/23 0610 05/02/23 0758  BP:  122/81  105/71  Pulse:  87    Resp:  20    Temp: 97.6 F (36.4 C)   97.6 F (36.4 C)  TempSrc: Oral   Oral  SpO2:  95%    Weight:   98 kg   Height:       Physical Exam HENT:     Head: Normocephalic.  Eyes:     General: Lids are  normal.     Conjunctiva/sclera: Conjunctivae normal.  Cardiovascular:     Rate and Rhythm: Normal rate. Rhythm irregularly irregular.     Heart sounds: Normal heart sounds, S1 normal and S2 normal.  Pulmonary:     Breath sounds: Examination of the right-lower field  reveals decreased breath sounds and wheezing. Examination of the left-lower field reveals decreased breath sounds and wheezing. Decreased breath sounds and wheezing present. No rhonchi or rales.  Abdominal:     Palpations: Abdomen is soft.     Tenderness: There is no abdominal tenderness.     Comments: Abdomen less distended today.  Musculoskeletal:     Right lower leg: No swelling.     Left lower leg: No swelling.  Skin:    General: Skin is warm.     Findings: No rash.  Neurological:     Mental Status: He is alert.     Data Reviewed: White blood count 12.9, hemoglobin 11.6, platelet count 346  Family Communication: Spoke with son on the phone  Disposition: Status is: Inpatient Remains inpatient appropriate because: Continuing IV antibiotics.  Slowly advancing diet.  Trying to get the bowels moving better.  Planned Discharge Destination: Rehab    Time spent: 28 minutes  Author: Alford Highland, MD 05/02/2023 4:27 PM  For on call review [email or web address].

## 2023-05-02 NOTE — Progress Notes (Signed)
Heart Failure Nurse Navigator Progress Note  PCP: Pearson Grippe, MD PCP-Cardiologist: [facility] Admission Diagnosis:  Acute on chronic respiratory failure with hypoxia [facility]) Atrial fibrillation Admitted from: [facility]  Presentation:   Jung Yurchak presented with shortness of breath, gradual onset over the last few days.  Leg swelling and cough. BNP 152.3.  ECHO/ LVEF: 55-60%  Clinical Course:  Past Medical History:  Diagnosis Date   Allergic rhinitis    Anxiety    Arthritis    Atrial fibrillation (HCC)    Chronic indwelling Foley catheter    COPD (chronic obstructive pulmonary disease) (HCC)    Cough    Diabetes (HCC)    Essential (primary) hypertension    Hemorrhoid    Hyperlipidemia    PAF (paroxysmal atrial fibrillation) (HCC)    Primary osteoarthritis, unspecified shoulder    Retention of urine, unspecified    Spinal stenosis    UTI (urinary tract infection)      Social History   Socioeconomic History   Marital status: Divorced    Spouse name: Not on file   Number of children: Not on file   Years of education: Not on file   Highest education level: Not on file  Occupational History   Occupation: retired    Comment: retired Hotel manager x 4 years; Scientist, physiological; Arboriculturist as Academic librarian  Tobacco Use   Smoking status: Former    Current packs/day: 0.00    Average packs/day: 0.5 packs/day for 10.0 years (5.0 ttl pk-yrs)    Types: Cigarettes    Start date: 87    Quit date: 2006    Years since quitting: 19.0   Smokeless tobacco: Never  Vaping Use   Vaping status: Never Used  Substance and Sexual Activity   Alcohol use: Not Currently   Drug use: Not Currently    Types: "Crack" cocaine    Comment: quit "crack" 15 years ago--11/04/2019   Sexual activity: Not on file  Other Topics Concern   Not on file  Social History Narrative   Not on file   Social Drivers of Health   Financial Resource Strain: Low Risk  (05/02/2023)   Overall Financial Resource  Strain (CARDIA)    Difficulty of Paying Living Expenses: Not hard at all  Food Insecurity: No Food Insecurity (05/01/2023)   Hunger Vital Sign    Worried About Running Out of Food in the Last Year: Never true    Ran Out of Food in the Last Year: Never true  Transportation Needs: No Transportation Needs (05/02/2023)   PRAPARE - Administrator, Civil Service (Medical): No    Lack of Transportation (Non-Medical): No  Physical Activity: Not on file  Stress: Not on file  Social Connections: Unknown (05/01/2023)   Social Connection and Isolation Panel [NHANES]    Frequency of Communication with Friends and Family: Patient unable to answer    Frequency of Social Gatherings with Friends and Family: Not on file    Attends Religious Services: Not on file    Active Member of Clubs or Organizations: Not on file    Attends Banker Meetings: Not on file    Marital Status: Not on file   Education Assessment and Provision:  Detailed education and instructions provided on heart failure disease management including the following:  Signs and symptoms of Heart Failure When to call the physician Importance of daily weights Low sodium diet Fluid restriction Medication management Anticipated future follow-up appointments  Patient education given  on each of the above topics.  Patient acknowledges understanding via teach back method and acceptance of all instructions.  Education Materials:  "Living Better With Heart Failure" Booklet, HF zone tool, & Daily Weight Tracker Tool.  Patient has scale at home: [facility] Patient has pill box at home: Facility gives patient  his medications    High Risk Criteria for Readmission and/or Poor Patient Outcomes: Heart failure hospital admissions (last 6 months): 0  No Show rate: 6 Difficult social situation: None Demonstrates medication adherence: Yes Primary Language: English Literacy level: Reading, Writing &  Comprehension  Barriers of Care:   None  Considerations/Referrals:   Referral made to Heart Failure Pharmacist Stewardship: Yes Referral made to Heart Failure CSW/NCM TOC: No Referral made to Heart & Vascular TOC clinic: Pt goes to the [place] for his care.  Educated patient that he needed to be seen within 1 week of hospital discharge for follow-up appointment.  Items for Follow-up on DC/TOC: Diet & Fluid Restrictions Daily weights-Pt says they are done weekly at [facility] Medication Compliance-Medications given by the facility Continued Heart Failure Medication  Roxy Horseman, RN, BSN [facility] Heart Failure Navigator Secure Chat Only

## 2023-05-03 DIAGNOSIS — J9621 Acute and chronic respiratory failure with hypoxia: Secondary | ICD-10-CM | POA: Diagnosis not present

## 2023-05-03 DIAGNOSIS — R140 Abdominal distension (gaseous): Secondary | ICD-10-CM | POA: Diagnosis not present

## 2023-05-03 DIAGNOSIS — J441 Chronic obstructive pulmonary disease with (acute) exacerbation: Secondary | ICD-10-CM | POA: Diagnosis not present

## 2023-05-03 DIAGNOSIS — J189 Pneumonia, unspecified organism: Secondary | ICD-10-CM | POA: Diagnosis not present

## 2023-05-03 LAB — CBC
HCT: 32.7 % — ABNORMAL LOW (ref 39.0–52.0)
Hemoglobin: 10.6 g/dL — ABNORMAL LOW (ref 13.0–17.0)
MCH: 29.4 pg (ref 26.0–34.0)
MCHC: 32.4 g/dL (ref 30.0–36.0)
MCV: 90.6 fL (ref 80.0–100.0)
Platelets: 293 10*3/uL (ref 150–400)
RBC: 3.61 MIL/uL — ABNORMAL LOW (ref 4.22–5.81)
RDW: 17 % — ABNORMAL HIGH (ref 11.5–15.5)
WBC: 9.3 10*3/uL (ref 4.0–10.5)
nRBC: 0 % (ref 0.0–0.2)

## 2023-05-03 LAB — BASIC METABOLIC PANEL
Anion gap: 9 (ref 5–15)
BUN: 51 mg/dL — ABNORMAL HIGH (ref 8–23)
CO2: 22 mmol/L (ref 22–32)
Calcium: 9 mg/dL (ref 8.9–10.3)
Chloride: 102 mmol/L (ref 98–111)
Creatinine, Ser: 1.16 mg/dL (ref 0.61–1.24)
GFR, Estimated: >60 mL/min (ref >60)
Glucose, Bld: 282 mg/dL — ABNORMAL HIGH (ref 70–99)
Potassium: 4.5 mmol/L (ref 3.5–5.1)
Sodium: 133 mmol/L — ABNORMAL LOW (ref 135–145)

## 2023-05-03 LAB — APTT: aPTT: 91 s — ABNORMAL HIGH (ref 24–36)

## 2023-05-03 LAB — HEPARIN LEVEL (UNFRACTIONATED): Heparin Unfractionated: >1.1 [IU]/mL — ABNORMAL HIGH (ref 0.30–0.70)

## 2023-05-03 MED ORDER — LACTULOSE 10 GM/15ML PO SOLN
30.0000 g | Freq: Two times a day (BID) | ORAL | Status: DC
  Filled 2023-05-03: qty 60

## 2023-05-03 MED ORDER — FAMOTIDINE 20 MG PO TABS
20.0000 mg | ORAL_TABLET | Freq: Every day | ORAL | Status: DC
Start: 2023-05-03 — End: 2023-05-09
  Administered 2023-05-03 – 2023-05-09 (×7): 20 mg via ORAL
  Filled 2023-05-03 (×7): qty 1

## 2023-05-03 MED ORDER — CALCIUM CARBONATE ANTACID 500 MG PO CHEW
1.0000 | CHEWABLE_TABLET | Freq: Three times a day (TID) | ORAL | Status: DC | PRN
  Administered 2023-05-03 – 2023-05-07 (×3): 200 mg via ORAL
  Filled 2023-05-03 (×3): qty 1

## 2023-05-03 MED ORDER — IPRATROPIUM-ALBUTEROL 0.5-2.5 (3) MG/3ML IN SOLN
3.0000 mL | Freq: Three times a day (TID) | RESPIRATORY_TRACT | Status: DC
  Administered 2023-05-03 – 2023-05-05 (×8): 3 mL via RESPIRATORY_TRACT
  Filled 2023-05-03 (×9): qty 3

## 2023-05-03 MED ORDER — APIXABAN 5 MG PO TABS
5.0000 mg | ORAL_TABLET | Freq: Two times a day (BID) | ORAL | Status: DC
  Administered 2023-05-03 – 2023-05-09 (×13): 5 mg via ORAL
  Filled 2023-05-03 (×13): qty 1

## 2023-05-03 MED ORDER — MUPIROCIN 2 % EX OINT
1.0000 | TOPICAL_OINTMENT | Freq: Two times a day (BID) | CUTANEOUS | Status: AC
  Administered 2023-05-03 – 2023-05-07 (×10): 1 via NASAL
  Filled 2023-05-03: qty 22

## 2023-05-03 MED ORDER — CHLORHEXIDINE GLUCONATE CLOTH 2 % EX PADS
6.0000 | MEDICATED_PAD | Freq: Every day | CUTANEOUS | Status: AC
  Administered 2023-05-03 – 2023-05-07 (×5): 6 via TOPICAL

## 2023-05-03 MED ORDER — FUROSEMIDE 20 MG PO TABS: 20.0000 mg | ORAL_TABLET | Freq: Every day | ORAL | Status: DC

## 2023-05-03 MED ORDER — ORAL CARE MOUTH RINSE: 15.0000 mL | OROMUCOSAL | Status: DC | PRN

## 2023-05-03 NOTE — TOC Initial Note (Signed)
Transition of Care [facility]) - Initial/Assessment Note    Patient Details  Name: Raymond Hays MRN: [id number] Date of Birth: 16-Apr-1945  Transition of Care [facility]) CM/SW Contact:    Hetty Ely, RN Phone Number: 05/03/2023, 2:38 PM  Clinical Narrative: Patient confused, CM called [facility] spoke with Glee Arvin who confirm that patient is in LTC, Long Term Care room 313.                 Expected Discharge Plan: Long Term Acute Care (LTAC) Barriers to Discharge: Continued Medical Work up   Patient Goals and CMS Choice Patient states their goals for this hospitalization and ongoing recovery are:: To return to [facility], room 313          Expected Discharge Plan and Services   Discharge Planning Services: Other - See comment (LTC facility)   Living arrangements for the past 2 months: Skilled Nursing Facility (Long Term Care at [facility])                 DME Arranged: N/A DME Agency: NA       HH Arranged: NA HH Agency: NA        Prior Living Arrangements/Services Living arrangements for the past 2 months: Skilled Holiday representative (Long Term Care at [facility]) Lives with:: Facility Resident Patient language and need for interpreter reviewed:: No Do you feel safe going back to the place where you live?:  (Patient is confused)      Need for Family Participation in Patient Care: Yes (Comment) Care giver support system in place?: Yes (comment) Current home services: Other (comment) Cabin crew) Criminal Activity/Legal Involvement Pertinent to Current Situation/Hospitalization: No - Comment as needed  Activities of Daily Living   ADL Screening (condition at time of admission) Independently performs ADLs?: No Does the patient have a NEW difficulty with bathing/dressing/toileting/self-feeding that is expected to last >3 days?: Yes (Initiates electronic notice to provider for possible OT consult) Does the patient have a NEW difficulty with getting in/out of bed,  walking, or climbing stairs that is expected to last >3 days?: Yes (Initiates electronic notice to provider for possible PT consult) Does the patient have a NEW difficulty with communication that is expected to last >3 days?: No Is the patient deaf or have difficulty hearing?: No Does the patient have difficulty seeing, even when wearing glasses/contacts?: No  Permission Sought/Granted Permission sought to share information with : Facility Medical sales representative                Emotional Assessment Appearance:: Other (Comment Required (Spoke with WOM about patient.) Attitude/Demeanor/Rapport: Unable to Assess (Confused) Affect (typically observed): Unable to Assess Orientation: :  (Confused) Alcohol / Substance Use: Not Applicable Psych Involvement: No (comment)  Admission diagnosis:  COPD exacerbation (HCC) [J44.1] Atrial fibrillation with RVR (HCC) [I48.91] Acute on chronic diastolic CHF (congestive heart failure) (HCC) [I50.33] Acute on chronic respiratory failure with hypoxia (HCC) [J96.21] Patient Active Problem List   Diagnosis Date Noted   Multifocal pneumonia 04/30/2023   COPD exacerbation (HCC) 04/29/2023   Acute on chronic diastolic CHF (congestive heart failure) (HCC) 04/29/2023   Acute on chronic respiratory failure with hypoxia (HCC) 04/29/2023   HLD (hyperlipidemia) 04/29/2023   Abdominal distension 04/29/2023   Acute metabolic encephalopathy 04/29/2023   Pneumonia of right lower lobe due to infectious organism 03/12/2023   Pressure injury of skin 03/12/2023   Ambulatory dysfunction 03/12/2023   Dehydration 12/07/2022   Paroxysmal atrial fibrillation with  rapid ventricular response (HCC) 12/07/2022   Constipation 12/07/2022   Thoracic spondylosis with cord compression (T10) 09/25/2022   Hx of thoracic laminectomy (T11-12) 09/25/2022   Metabolic acidosis 09/15/2022   Aspiration pneumonia of right lower lobe (HCC) 09/15/2022   Complicated UTI (urinary tract  infection) 09/14/2022   Hypothyroidism 09/14/2022   Depression with anxiety 09/14/2022   Obesity (BMI 30-39.9) 09/14/2022   Prolonged QT interval 09/14/2022   Suprapubic catheter (HCC) 09/14/2022   Abnormal MRI, thoracic spine (08/23/2022) 09/03/2022   DDD (degenerative disc disease), cervical 08/05/2022   Neurogenic incontinence 08/05/2022   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) 08/05/2022   Foraminal stenosis of thoracic region 08/05/2022   Thoracic Facet Arthropathy 08/05/2022   Lower extremity numbness (Bilateral) 08/05/2022   Weakness of lower extremity (Bilateral) 08/05/2022   Chronic midline low back pain with left-sided sciatica 08/05/2022   Left foot drop 08/05/2022   Lumbar postlaminectomy syndrome 08/05/2022   Poor memory 08/05/2022   Vitamin B12 deficiency 07/25/2022   Pharmacologic therapy 07/24/2022   Disorder of skeletal system 07/24/2022   Problems influencing health status 07/24/2022   Elevated sed rate 07/24/2022   Elevated C-reactive protein (CRP) 07/24/2022   Chronic anticoagulation (ELIQUIS) 07/24/2022   Chronic neck and back pain (1ry area of Pain) (Bilateral) (L>R) 07/24/2022   Abnormal MRI, cervical spine (08/23/2022) 07/24/2022   History of cervical spinal surgery 07/24/2022   History of lumbar surgery 07/24/2022   History of thoracic surgery 07/24/2022   Laryngopharyngeal reflux 06/17/2022   Throat clearing 06/17/2022   Acute respiratory failure (HCC) 04/03/2021   E. coli UTI    Herpes zoster conjunctivitis of eye (Left)    Severe sepsis (HCC) 03/16/2021   Obstipation 01/30/2021   Anxiety 01/30/2021   Iron deficiency anemia    Edema leg    Respiratory failure (HCC) 01/09/2021   Ileus (HCC)    Neck pain    AKI (acute kidney injury) (HCC)    Type 2 diabetes mellitus with diabetic neuropathy, without long-term current use of insulin (HCC)    Parkinson disease (HCC)    Status post implantation of mitral valve leaflet clip 06/27/2020    Chronic respiratory failure with hypoxia (HCC) 01/10/2020   Impaired functional mobility, balance, gait, and endurance 09/15/2019   Catheter cystitis (HCC) 10/16/2018   Urinary retention 07/14/2018   COVID-19 virus detected 07/03/2018   Benign prostatic hyperplasia with lower urinary tract symptoms 08/06/2017   Osteoarthritis of right knee 06/19/2017   S/P orthopedic surgery, follow-up exam 10/25/2016   Tendon rupture of wrist, sequela 01/02/2016   Rotator cuff tear arthropathy of right shoulder 09/28/2015   Acute pain of right knee 08/03/2015   Chronic knee pain (Right) 08/03/2015   Calcific tendinitis of left shoulder 07/05/2015   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/05/2015   Primary osteoarthritis of left shoulder 07/05/2015   Gait difficulty 06/26/2015   Severe mitral regurgitation 06/05/2015   AF (paroxysmal atrial fibrillation) (HCC) 05/31/2015   Chronic pain syndrome 05/31/2015   COPD with acute exacerbation (HCC) 05/31/2015   Essential hypertension 05/31/2015   Atrial fibrillation (HCC) 05/31/2015   Chronic wrist pain (Right) 04/24/2015   Arthritis of left wrist 04/24/2015   Rupture of extensor tendon of left hand 04/24/2015   PCP:  Pearson Grippe, MD Pharmacy:   [facility] - [place], [place] - [address] [place] [address] Phone: [phone] Fax: [phone]  [facility] - [place], [place] - [address] [place] [address] Phone: [phone] Fax: [phone]     Social Drivers of Health (SDOH) Social History: SDOH Screenings   Food Insecurity: No Food Insecurity (05/01/2023)  Housing: Unknown (05/02/2023)  Transportation Needs: No Transportation Needs (05/02/2023)  Utilities: Not At Risk (05/01/2023)  Depression (PHQ2-9): Low Risk  (09/25/2022)  Financial Resource Strain: Low Risk  (05/02/2023)  Social Connections: Unknown  (05/01/2023)  Tobacco Use: Medium Risk (04/29/2023)   SDOH Interventions: Housing Interventions: Intervention Not Indicated Transportation Interventions: Intervention Not Indicated Financial Strain Interventions: Intervention Not Indicated   Readmission Risk Interventions    03/12/2023    2:19 PM 01/12/2021    1:08 PM 10/20/2020    8:59 AM  Readmission Risk Prevention Plan  Transportation Screening  Complete Complete  PCP or Specialist Appt within 3-5 Days   Complete  HRI or Home Care Consult   Complete  Social Work Consult for Recovery Care Planning/Counseling     Palliative Care Screening   Not Applicable  Medication Review Oceanographer)  Complete Referral to Pharmacy  PCP or Specialist appointment within 3-5 days of discharge  Complete   HRI or Home Care Consult  Complete   SW Recovery Care/Counseling Consult  Complete   Palliative Care Screening  Not Applicable   Skilled Nursing Facility  Complete      Information is confidential and restricted. Go to Review Flowsheets to unlock data.

## 2023-05-03 NOTE — Consult Note (Signed)
Pharmacy Consult Note - Anticoagulation  Pharmacy Consult for heparin Indication: atrial fibrillation  PATIENT MEASUREMENTS: Height: 5\' 7"  (170.2 cm) Weight: 99 kg (218 lb 4.1 oz) IBW/kg (Calculated) : 66.1 HEPARIN DW (KG): 87.8  VITAL SIGNS: Temp: 97.6 F (36.4 C) (02/01 0400) Temp Source: Oral (02/01 0400) BP: 117/67 (02/01 0500) Pulse Rate: 84 (02/01 0500)  Recent Labs    04/30/23 1724 05/01/23 0311 05/03/23 0455  HGB  --    < > 10.6*  HCT  --    < > 32.7*  PLT  --    < > 293  APTT 41*   < > 91*  LABPROT 20.2*  --   --   INR 1.7*  --   --   HEPARINUNFRC >1.10*   < > >1.10*  CREATININE  --    < > 1.16   < > = values in this interval not displayed.    Estimated Creatinine Clearance: 57.9 mL/min (by C-G formula based on SCr of 1.16 mg/dL).  PAST MEDICAL HISTORY: Past Medical History:  Diagnosis Date   Allergic rhinitis    Anxiety    Arthritis    Atrial fibrillation (HCC)    Chronic indwelling Foley catheter    COPD (chronic obstructive pulmonary disease) (HCC)    Cough    Diabetes (HCC)    Essential (primary) hypertension    Hemorrhoid    Hyperlipidemia    PAF (paroxysmal atrial fibrillation) (HCC)    Primary osteoarthritis, unspecified shoulder    Retention of urine, unspecified    Spinal stenosis    UTI (urinary tract infection)     ASSESSMENT: 78 y.o. male with PMH including COPD, HTN, dCHF, Afib is presenting with pneumonia and acute on chronic heart failure. Patient is on chronic anticoagulation with Eliquis per chart review. Last dose of Eliquis was 1/28 at 1400, will order baseline heparin level to assess for false elevation. Pharmacy has been consulted to initiate and manage heparin intravenous infusion.  Pertinent medications: Eliquis 5 mg twice daily Last dose PTA was on 1/28 @ 1400  Goal(s) of therapy: Heparin level 0.3 - 0.7 units/mL aPTT 66 - 102 seconds Monitor platelets by anticoagulation protocol: Yes   Baseline anticoagulation  labs: Recent Labs    04/30/23 1724 05/01/23 0311 05/01/23 0501 05/01/23 1129 05/01/23 2102 05/02/23 0540 05/03/23 0455  APTT 41*   < >  --    < > 69* 101* 91*  INR 1.7*  --   --   --   --   --   --   HGB  --   --  11.6*  --   --  11.6* 10.6*  PLT  --   --  328  --   --  346 293   < > = values in this interval not displayed.    Date Time aPTT/HL Rate/Comment 1/30     0311    68/>1.10        1200 units/hr, therapeutic X 1 1/30 1129 60  Subtherapeutic 1/30 2102 69  Therapeutic.  1/31     0540   101 />1.10       Therapeutic X 2  2/01     0455     91 />1.10       Therapeutic X 3     PLAN: 2/01 @ 0455:  aPTT = 91,  HL = > 1.10 - aPTT therapeutic X 3,  HL still elevated from Eliquis PTA - Will continue pt on current  rate and recheck aPTT and HL on 2/02 with AM labs.  - Switch to heparin level monitor once aPTT and heparin level correlate.   Cincere Zorn D Clinical Pharmacist 05/03/2023 6:36 AM

## 2023-05-03 NOTE — Progress Notes (Signed)
Progress Note   Patient: Raymond Hays ZOX:[id number] DOB: [birth year] DOA: 04/29/2023     4 DOS: the patient was seen and examined on 05/03/2023   Brief hospital course: 78 year old man past medical history of COPD on chronic 2 L of oxygen, hypertension, hyperlipidemia, diastolic congestive heart failure, suprapubic Foley catheter, neurogenic bladder, atrial fibrillation on Eliquis, spinal stenosis, wheelchair-bound, Parkinson's disease, hypertension, diet-controlled diabetes, hypothyroidism, gout, depression, anxiety, BPH, anemia who presented with altered mental status and shortness of breath.  He was placed on BiPAP given Lasix and antibiotics and steroids.  Surgical consultation for distended abdomen likely secondary to constipation and/or ileus.  1/29.  Patient had altered mental status this morning MRI of the brain was done which showed no acute intracranial abnormality.  Patient off BiPAP and on oxygen upon reevaluation. 1/30.  Abdomen still distended.  Patient not having abdominal pain.  Surgeon did a rectal exam in the evening and disimpacted and had a lot of gas that came out.  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 1/31.  Abdomen less distended today.  Patient did have a bowel movement this morning.  Continue enemas in the evening will give a suppository.  Continue lactulose and MiraLAX. 2/1.  Patient had multiple bowel movements.  Asking to go back on solid food.  Assessment and Plan: * Acute on chronic respiratory failure with hypoxia (HCC) Came in with respiratory distress and tachypnea placed on BiPAP.  Chronically wears 2 L of oxygen.  Now on 3 L.  COPD exacerbation (HCC) Continue Solu-Medrol and nebulizer treatments.  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  Will advance to solid food.  Abdomen less distended than yesterday.  Continue MiraLAX to twice daily.  Continue lactulose daily.  Continue enema at night.  Surgical team does not think he would survive a surgical  procedure.  Multifocal pneumonia Bilateral pneumonia seen on CT scan.  Currently on vancomycin and Maxipime.  White blood cell count normalized  Severe sepsis (HCC) Present on admission with leukocytosis tachycardia and tachypnea and multifocal pneumonia and acute on chronic respiratory failure.  Acute on chronic diastolic CHF (congestive heart failure) (HCC) Changed to oral Lasix for tomorrow.  Appears euvolemic.  Last EF 55%.  Acute metabolic encephalopathy MRI of the brain negative for stroke.  Mental status improved from admission.  Paroxysmal atrial fibrillation with rapid ventricular response (HCC) Continue oral Cardizem.  Change heparin drip over to Eliquis.  Essential hypertension Continue oral Cardizem  Hypothyroidism Continue levothyroxine  Parkinson disease (HCC) Continue Sinemet  HLD (hyperlipidemia) Continue Lipitor  Depression with anxiety Continue home medication  Obesity (BMI 30-39.9) BMI 34.18  Pressure injury of skin Multiple areas see full description below.  Present on admission.        Subjective: Patient feels okay had multiple bowel movements.  Will convert heparin back to Eliquis.  Asking for solid food.  Asking when he can go back to his facility  Physical Exam: Vitals:   05/03/23 0738 05/03/23 0800 05/03/23 0840 05/03/23 0900  BP:  99/84  109/76  Pulse:  79  (!) 114  Resp:  15  14  Temp: (!) 97.5 F (36.4 C)     TempSrc:      SpO2:  96% 97% (!) 89%  Weight:      Height:       Physical Exam HENT:     Head: Normocephalic.  Eyes:     General: Lids are normal.     Conjunctiva/sclera: Conjunctivae normal.  Cardiovascular:     Rate and Rhythm:  Normal rate. Rhythm irregularly irregular.     Heart sounds: Normal heart sounds, S1 normal and S2 normal.  Pulmonary:     Breath sounds: Examination of the right-middle field reveals wheezing. Examination of the left-middle field reveals wheezing. Examination of the right-lower field  reveals decreased breath sounds and wheezing. Examination of the left-lower field reveals decreased breath sounds and wheezing. Decreased breath sounds and wheezing present. No rhonchi or rales.  Abdominal:     Palpations: Abdomen is soft.     Tenderness: There is no abdominal tenderness.     Comments: Abdomen less distended today.  Musculoskeletal:     Right lower leg: No swelling.     Left lower leg: No swelling.  Skin:    General: Skin is warm.     Findings: No rash.  Neurological:     Mental Status: He is alert.     Data Reviewed: Sodium 133, creatinine 1.16, hemoglobin 10.6, white blood count 9.3  Family Communication: Updated son on the phone  Disposition: Status is: Inpatient Remains inpatient appropriate because: TOC to arrange going back to his facility for Monday  Planned Discharge Destination: Long-term care    Time spent: 28 minutes  Author: Alford Highland, MD 05/03/2023 3:17 PM  For on call review [email or web address].

## 2023-05-03 NOTE — Plan of Care (Signed)
  Problem: Education: Goal: Knowledge of General Education information will improve Description: Including pain rating scale, medication(s)/side effects and non-pharmacologic comfort measures Outcome: Not Progressing   Problem: Health Behavior/Discharge Planning: Goal: Ability to manage health-related needs will improve Outcome: Not Progressing   Problem: Clinical Measurements: Goal: Ability to maintain clinical measurements within normal limits will improve Outcome: Not Progressing Goal: Will remain free from infection Outcome: Not Progressing Goal: Diagnostic test results will improve Outcome: Not Progressing Goal: Respiratory complications will improve Outcome: Not Progressing Goal: Cardiovascular complication will be avoided Outcome: Not Progressing   Problem: Activity: Goal: Risk for activity intolerance will decrease Outcome: Not Progressing   Problem: Nutrition: Goal: Adequate nutrition will be maintained Outcome: Not Progressing   Problem: Coping: Goal: Level of anxiety will decrease Outcome: Not Progressing   Problem: Elimination: Goal: Will not experience complications related to bowel motility Outcome: Not Progressing Goal: Will not experience complications related to urinary retention Outcome: Not Progressing   Problem: Pain Managment: Goal: General experience of comfort will improve and/or be controlled Outcome: Not Progressing   Problem: Safety: Goal: Ability to remain free from injury will improve Outcome: Not Progressing   Problem: Skin Integrity: Goal: Risk for impaired skin integrity will decrease Outcome: Not Progressing   Problem: Education: Goal: Ability to demonstrate management of disease process will improve Outcome: Not Progressing Goal: Ability to verbalize understanding of medication therapies will improve Outcome: Not Progressing Goal: Individualized Educational Video(s) Outcome: Not Progressing   Problem: Activity: Goal:  Capacity to carry out activities will improve Outcome: Not Progressing   Problem: Cardiac: Goal: Ability to achieve and maintain adequate cardiopulmonary perfusion will improve Outcome: Not Progressing   Problem: Education: Goal: Knowledge of disease or condition will improve Outcome: Not Progressing Goal: Knowledge of the prescribed therapeutic regimen will improve Outcome: Not Progressing Goal: Individualized Educational Video(s) Outcome: Not Progressing   Problem: Activity: Goal: Ability to tolerate increased activity will improve Outcome: Not Progressing Goal: Will verbalize the importance of balancing activity with adequate rest periods Outcome: Not Progressing   Problem: Respiratory: Goal: Ability to maintain a clear airway will improve Outcome: Not Progressing Goal: Levels of oxygenation will improve Outcome: Not Progressing Goal: Ability to maintain adequate ventilation will improve Outcome: Not Progressing   Problem: Activity: Goal: Ability to tolerate increased activity will improve Outcome: Not Progressing   Problem: Clinical Measurements: Goal: Ability to maintain a body temperature in the normal range will improve Outcome: Not Progressing   Problem: Respiratory: Goal: Ability to maintain adequate ventilation will improve Outcome: Not Progressing Goal: Ability to maintain a clear airway will improve Outcome: Not Progressing

## 2023-05-04 DIAGNOSIS — J9621 Acute and chronic respiratory failure with hypoxia: Secondary | ICD-10-CM | POA: Diagnosis not present

## 2023-05-04 DIAGNOSIS — J189 Pneumonia, unspecified organism: Secondary | ICD-10-CM | POA: Diagnosis not present

## 2023-05-04 DIAGNOSIS — J441 Chronic obstructive pulmonary disease with (acute) exacerbation: Secondary | ICD-10-CM | POA: Diagnosis not present

## 2023-05-04 DIAGNOSIS — R140 Abdominal distension (gaseous): Secondary | ICD-10-CM | POA: Diagnosis not present

## 2023-05-04 LAB — CBC
HCT: 35.4 % — ABNORMAL LOW (ref 39.0–52.0)
Hemoglobin: 11.8 g/dL — ABNORMAL LOW (ref 13.0–17.0)
MCH: 29.7 pg (ref 26.0–34.0)
MCHC: 33.3 g/dL (ref 30.0–36.0)
MCV: 89.2 fL (ref 80.0–100.0)
Platelets: 290 10*3/uL (ref 150–400)
RBC: 3.97 MIL/uL — ABNORMAL LOW (ref 4.22–5.81)
RDW: 16.9 % — ABNORMAL HIGH (ref 11.5–15.5)
WBC: 10.8 10*3/uL — ABNORMAL HIGH (ref 4.0–10.5)
nRBC: 0 % (ref 0.0–0.2)

## 2023-05-04 LAB — BASIC METABOLIC PANEL
Anion gap: 8 (ref 5–15)
BUN: 39 mg/dL — ABNORMAL HIGH (ref 8–23)
CO2: 26 mmol/L (ref 22–32)
Calcium: 9.1 mg/dL (ref 8.9–10.3)
Chloride: 98 mmol/L (ref 98–111)
Creatinine, Ser: 0.97 mg/dL (ref 0.61–1.24)
GFR, Estimated: >60 mL/min (ref >60)
Glucose, Bld: 322 mg/dL — ABNORMAL HIGH (ref 70–99)
Potassium: 4.4 mmol/L (ref 3.5–5.1)
Sodium: 132 mmol/L — ABNORMAL LOW (ref 135–145)

## 2023-05-04 LAB — CULTURE, BLOOD (ROUTINE X 2)
Culture: NO GROWTH
Culture: NO GROWTH
Special Requests: ADEQUATE
Special Requests: ADEQUATE

## 2023-05-04 MED ORDER — DILTIAZEM HCL ER COATED BEADS 180 MG PO CP24
360.0000 mg | ORAL_CAPSULE | Freq: Every day | ORAL | Status: DC
Start: 2023-05-04 — End: 2023-05-08
  Administered 2023-05-04 – 2023-05-08 (×5): 360 mg via ORAL
  Filled 2023-05-04 (×5): qty 2

## 2023-05-04 MED ORDER — CEFEPIME HCL 2 G IV SOLR
2.0000 g | Freq: Three times a day (TID) | INTRAVENOUS | Status: AC
  Administered 2023-05-04 – 2023-05-05 (×3): 2 g via INTRAVENOUS
  Filled 2023-05-04 (×3): qty 12.5

## 2023-05-04 MED ORDER — LACTULOSE 10 GM/15ML PO SOLN
30.0000 g | Freq: Every day | ORAL | Status: DC
  Administered 2023-05-05 – 2023-05-09 (×5): 30 g via ORAL
  Filled 2023-05-04 (×5): qty 60

## 2023-05-04 MED ORDER — FUROSEMIDE 40 MG PO TABS
40.0000 mg | ORAL_TABLET | Freq: Every day | ORAL | Status: DC
  Administered 2023-05-04 – 2023-05-09 (×6): 40 mg via ORAL
  Filled 2023-05-04 (×6): qty 1

## 2023-05-04 NOTE — Progress Notes (Signed)
Progress Note   Patient: Raymond Hays ZOX:[id number] DOB: March 05, 1946 DOA: 04/29/2023     5 DOS: the patient was seen and examined on 05/04/2023   Brief hospital course: 78 year old man past medical history of COPD on chronic 2 L of oxygen, hypertension, hyperlipidemia, diastolic congestive heart failure, suprapubic Foley catheter, neurogenic bladder, atrial fibrillation on Eliquis, spinal stenosis, wheelchair-bound, Parkinson's disease, hypertension, diet-controlled diabetes, hypothyroidism, gout, depression, anxiety, BPH, anemia who presented with altered mental status and shortness of breath.  He was placed on BiPAP given Lasix and antibiotics and steroids.  Surgical consultation for distended abdomen likely secondary to constipation and/or ileus.  1/29.  Patient had altered mental status this morning MRI of the brain was done which showed no acute intracranial abnormality.  Patient off BiPAP and on oxygen upon reevaluation. 1/30.  Abdomen still distended.  Patient not having abdominal pain.  Surgeon did a rectal exam in the evening and disimpacted and had a lot of gas that came out.  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 1/31.  Abdomen less distended today.  Patient did have a bowel movement this morning.  Continue enemas in the evening will give a suppository.  Continue lactulose and MiraLAX. 2/1.  Patient had multiple bowel movements.  Diet advanced to solid food. 2/2.  Patient asking when he can go back to his facility.  States he always has a wheeze.  Assessment and Plan: * Acute on chronic respiratory failure with hypoxia (HCC) Came in with respiratory distress and tachypnea placed on BiPAP.  Chronically wears 2 L of oxygen.  Now on 3 L.  Patient actually took off his oxygen and pulse ox was good.  COPD exacerbation (HCC) Continue Solu-Medrol and nebulizer treatments.  Patient has a lot of upper airway sounds secondary to not moving around.  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  Continue  solid food.  Abdomen less distended than admission.  Continue MiraLAX to twice daily.  Continue lactulose daily.  Continue enema at night.  Surgical team does not think he would survive a surgical procedure.  Multifocal pneumonia Bilateral pneumonia seen on CT scan.  Currently on vancomycin and Maxipime.  White blood cell count improved  Severe sepsis (HCC) Present on admission with leukocytosis tachycardia and tachypnea and multifocal pneumonia and acute on chronic respiratory failure.  Acute on chronic diastolic CHF (congestive heart failure) (HCC) Changed to oral Lasix.  Appears euvolemic.  Last EF 55%.  Still has right lower extremity swelling worse than the left.  Acute metabolic encephalopathy MRI of the brain negative for stroke.  Mental status improved from admission.  Paroxysmal atrial fibrillation with rapid ventricular response (HCC) Increased oral Cardizem with heart rate being little fast this morning.  Continue Eliquis.  Essential hypertension Continue oral Cardizem  Hypothyroidism Continue levothyroxine  Parkinson disease (HCC) Continue Sinemet  HLD (hyperlipidemia) Continue Lipitor  Depression with anxiety Continue home medication  Obesity (BMI 30-39.9) BMI 33.98  Pressure injury of skin Multiple areas see full description below.  Present on admission.        Subjective: Patient feels well.  Patient states he always has a wheeze.  Admitted with sepsis.  Physical Exam: Vitals:   05/04/23 0400 05/04/23 0500 05/04/23 0759 05/04/23 1341  BP: 125/88  135/83   Pulse: 96  (!) 102   Resp: 18  18   Temp:  98.2 F (36.8 C) 97.7 F (36.5 C)   TempSrc:  Oral Axillary   SpO2: 95%  96% 99%  Weight:  98.4 kg  Height:       Physical Exam HENT:     Head: Normocephalic.  Eyes:     General: Lids are normal.     Conjunctiva/sclera: Conjunctivae normal.  Cardiovascular:     Rate and Rhythm: Tachycardia present. Rhythm irregularly irregular.     Heart  sounds: Normal heart sounds, S1 normal and S2 normal.  Pulmonary:     Breath sounds: Transmitted upper airway sounds present. Examination of the right-lower field reveals decreased breath sounds and wheezing. Examination of the left-lower field reveals decreased breath sounds and wheezing. Decreased breath sounds and wheezing present. No rhonchi or rales.  Abdominal:     Palpations: Abdomen is soft.     Tenderness: There is no abdominal tenderness.     Comments: Abdomen less distended today.  Musculoskeletal:     Right lower leg: No swelling.     Left lower leg: No swelling.  Skin:    General: Skin is warm.     Findings: No rash.  Neurological:     Mental Status: He is alert.     Data Reviewed: Sodium 132, creatinine 0.97, white blood cell count 10.8, hemoglobin 11.8, platelet count 290  Family Communication: Left message for son  Disposition: Status is: Inpatient Remains inpatient appropriate because: Hopefully will be able to get back to long-term care tomorrow.  Planned Discharge Destination: Long-term care    Time spent: 28 minutes  Author: Alford Highland, MD 05/04/2023 2:03 PM  For on call review [email or web address].

## 2023-05-05 DIAGNOSIS — J189 Pneumonia, unspecified organism: Secondary | ICD-10-CM | POA: Diagnosis not present

## 2023-05-05 DIAGNOSIS — J9621 Acute and chronic respiratory failure with hypoxia: Secondary | ICD-10-CM | POA: Diagnosis not present

## 2023-05-05 DIAGNOSIS — R140 Abdominal distension (gaseous): Secondary | ICD-10-CM | POA: Diagnosis not present

## 2023-05-05 DIAGNOSIS — J441 Chronic obstructive pulmonary disease with (acute) exacerbation: Secondary | ICD-10-CM | POA: Diagnosis not present

## 2023-05-05 MED ORDER — HYDROCORTISONE 1 % EX CREA
1.0000 | TOPICAL_CREAM | Freq: Three times a day (TID) | CUTANEOUS | Status: DC | PRN
  Administered 2023-05-09: 1 via TOPICAL
  Filled 2023-05-05: qty 28

## 2023-05-05 MED ORDER — BISOPROLOL FUMARATE 5 MG PO TABS
5.0000 mg | ORAL_TABLET | Freq: Every day | ORAL | Status: DC
  Filled 2023-05-05: qty 1

## 2023-05-05 MED ORDER — AMIODARONE HCL 200 MG PO TABS
400.0000 mg | ORAL_TABLET | Freq: Every day | ORAL | Status: DC
  Administered 2023-05-05 – 2023-05-06 (×2): 400 mg via ORAL
  Filled 2023-05-05 (×2): qty 2

## 2023-05-05 NOTE — Progress Notes (Signed)
Progress Note   Patient: Raymond Hays WUJ:[id number] DOB: [birth year] DOA: 04/29/2023     6 DOS: the patient was seen and examined on 05/05/2023   Brief hospital course: 78 year old man past medical history of COPD on chronic 2 L of oxygen, hypertension, hyperlipidemia, diastolic congestive heart failure, suprapubic Foley catheter, neurogenic bladder, atrial fibrillation on Eliquis, spinal stenosis, wheelchair-bound, Parkinson's disease, hypertension, diet-controlled diabetes, hypothyroidism, gout, depression, anxiety, BPH, anemia who presented with altered mental status and shortness of breath.  He was placed on BiPAP given Lasix and antibiotics and steroids.  Surgical consultation for distended abdomen likely secondary to constipation and/or ileus.  1/29.  Patient had altered mental status this morning MRI of the brain was done which showed no acute intracranial abnormality.  Patient off BiPAP and on oxygen upon reevaluation. 1/30.  Abdomen still distended.  Patient not having abdominal pain.  Surgeon did a rectal exam in the evening and disimpacted and had a lot of gas that came out.  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 1/31.  Abdomen less distended today.  Patient did have a bowel movement this morning.  Continue enemas in the evening will give a suppository.  Continue lactulose and MiraLAX. 2/1.  Patient had multiple bowel movements.  Diet advanced to solid food. 2/2.  Patient asking when he can go back to his facility.  States he always has a wheeze. 2/3.  Patient asked me to call his sister.  Awaiting approval to go back to his facility.  Assessment and Plan: * Acute on chronic respiratory failure with hypoxia (HCC) Came in with respiratory distress and tachypnea placed on BiPAP.  Chronically wears 2 L of oxygen.  Now on room air.    COPD exacerbation (HCC) Completed Solu-Medrol continue inhalers and nebulizers.  Lungs sound clear after coughing and sitting upright.  Abdominal distension Likely  Ogilvie syndrome.  Continue solid food.  Abdomen less distended than admission.  Continue MiraLAX to twice daily.  Continue lactulose daily.  Continue enema at night.  Surgical team does not think he would survive a surgical procedure.  Multifocal pneumonia Bilateral pneumonia seen on CT scan.  Completed antibiotics  Severe sepsis (HCC) Present on admission with leukocytosis tachycardia and tachypnea and multifocal pneumonia and acute on chronic respiratory failure.  Acute on chronic diastolic CHF (congestive heart failure) (HCC) Changed to oral Lasix.  Appears euvolemic.  Last EF 55%.  Still has right lower extremity swelling worse than the left.  Acute metabolic encephalopathy MRI of the brain negative for stroke.  Mental status much improved from admission.  Paroxysmal atrial fibrillation with rapid ventricular response (HCC) Increased dose of Cardizem CD to 360 mg daily.  Added amiodarone.  Essential hypertension Continue oral Cardizem  Hypothyroidism Continue levothyroxine  Parkinson disease (HCC) Continue Sinemet  HLD (hyperlipidemia) Continue Lipitor  Depression with anxiety Continue home medication  Obesity (BMI 30-39.9) BMI 32.91  Pressure injury of skin Multiple areas see full description below.  Present on admission.        Subjective: Patient fell last 2 days has been asking to go back to his facility.  Feels okay.  Breathing is better.  Physical Exam: Vitals:   05/05/23 0518 05/05/23 0756 05/05/23 1157 05/05/23 1429  BP:  135/80 123/80   Pulse:  (!) 114 (!) 113   Resp:  18 18   Temp:  98.2 F (36.8 C) 98.2 F (36.8 C)   TempSrc:      SpO2:  95% 98% 97%  Weight: 95.3 kg  Height:       Physical Exam HENT:     Head: Normocephalic.  Eyes:     General: Lids are normal.     Conjunctiva/sclera: Conjunctivae normal.  Cardiovascular:     Rate and Rhythm: Tachycardia present. Rhythm irregularly irregular.     Heart sounds: Normal heart sounds,  S1 normal and S2 normal.  Pulmonary:     Breath sounds: Transmitted upper airway sounds present. Examination of the right-lower field reveals decreased breath sounds and wheezing. Examination of the left-lower field reveals decreased breath sounds and wheezing. Decreased breath sounds and wheezing present. No rhonchi or rales.  Abdominal:     Palpations: Abdomen is soft.     Tenderness: There is no abdominal tenderness.     Comments: Abdomen less distended today.  Musculoskeletal:     Right lower leg: No swelling.     Left lower leg: No swelling.  Skin:    General: Skin is warm.     Findings: No rash.  Neurological:     Mental Status: He is alert.     Data Reviewed: Sodium 132, creatinine 0.97, hemoglobin 11.8, white blood count 10.8, platelet count 290  Family Communication: Patient asked me to call his sister and not his son.  Disposition: Status is: Inpatient Remains inpatient appropriate because: Awaiting insurance authorization for rehab.  Planned Discharge Destination: Rehab    Time spent: 28 minutes  Author: Alford Highland, MD 05/05/2023 3:01 PM  For on call review [email or web address].

## 2023-05-05 NOTE — TOC Progression Note (Signed)
Transition of Care [facility]) - Progression Note    Patient Details  Name: Raymond Hays MRN: [id number] Date of Birth: December 03, 1945  [facility]) CM/SW Contact  Truddie Hidden, RN Phone Number: 05/05/2023, 10:05 AM  Clinical Narrative:    Sherron Monday with Gavin Pound, Admissions Coordinator for [facility]. Per Gavin Pound, patient will need VA auth from [place] prior to return to facility. Gavin Pound will email this RNCM contact information for VA auth. MD notified. Auth is required for facility return.      Expected Discharge Plan: Long Term Acute Care (LTAC) Barriers to Discharge: Continued Medical Work up  Expected Discharge Plan and Services   Discharge Planning Services: Other - See comment (LTC facility)   Living arrangements for the past 2 months: Skilled Nursing Facility ([facility] Term Care at [facility])                 DME Arranged: N/A DME Agency: NA       HH Arranged: NA HH Agency: NA         Social Determinants of Health (SDOH) Interventions SDOH Screenings   Food Insecurity: No Food Insecurity (05/01/2023)  Housing: Unknown (05/02/2023)  Transportation Needs: No Transportation Needs (05/02/2023)  Utilities: Not At Risk (05/01/2023)  Depression (PHQ2-9): Low Risk  (09/25/2022)  Financial Resource Strain: Low Risk  (05/02/2023)  Social Connections: Unknown (05/01/2023)  Tobacco Use: Medium Risk (04/29/2023)    Readmission Risk Interventions    03/12/2023    2:19 PM 01/12/2021    1:08 PM 10/20/2020    8:59 AM  Readmission Risk Prevention Plan  Transportation Screening  Complete Complete  PCP or Specialist Appt within 3-5 Days   Complete  HRI or Home Care Consult   Complete  Social Work Consult for Recovery Care Planning/Counseling     Palliative Care Screening   Not Applicable  Medication Review [facility])  Complete Referral to Pharmacy  PCP or Specialist appointment within 3-5 days of discharge  Complete   HRI or Home Care Consult  Complete   SW Recovery  Care/Counseling Consult  Complete   Palliative Care Screening  Not Applicable   Skilled Nursing Facility  Complete      Information is confidential and restricted. Go to Review Flowsheets to unlock data.

## 2023-05-06 DIAGNOSIS — I480 Paroxysmal atrial fibrillation: Secondary | ICD-10-CM | POA: Diagnosis not present

## 2023-05-06 DIAGNOSIS — J441 Chronic obstructive pulmonary disease with (acute) exacerbation: Secondary | ICD-10-CM | POA: Diagnosis not present

## 2023-05-06 DIAGNOSIS — R140 Abdominal distension (gaseous): Secondary | ICD-10-CM | POA: Diagnosis not present

## 2023-05-06 DIAGNOSIS — J9621 Acute and chronic respiratory failure with hypoxia: Secondary | ICD-10-CM | POA: Diagnosis not present

## 2023-05-06 LAB — CBC
HCT: 41.6 % (ref 39.0–52.0)
Hemoglobin: 14.1 g/dL (ref 13.0–17.0)
MCH: 29.3 pg (ref 26.0–34.0)
MCHC: 33.9 g/dL (ref 30.0–36.0)
MCV: 86.3 fL (ref 80.0–100.0)
Platelets: 301 10*3/uL (ref 150–400)
RBC: 4.82 MIL/uL (ref 4.22–5.81)
RDW: 16.7 % — ABNORMAL HIGH (ref 11.5–15.5)
WBC: 14.9 10*3/uL — ABNORMAL HIGH (ref 4.0–10.5)
nRBC: 0 % (ref 0.0–0.2)

## 2023-05-06 LAB — BASIC METABOLIC PANEL
Anion gap: 13 (ref 5–15)
BUN: 41 mg/dL — ABNORMAL HIGH (ref 8–23)
CO2: 29 mmol/L (ref 22–32)
Calcium: 8.9 mg/dL (ref 8.9–10.3)
Chloride: 91 mmol/L — ABNORMAL LOW (ref 98–111)
Creatinine, Ser: 0.99 mg/dL (ref 0.61–1.24)
GFR, Estimated: >60 mL/min (ref >60)
Glucose, Bld: 447 mg/dL — ABNORMAL HIGH (ref 70–99)
Potassium: 3.8 mmol/L (ref 3.5–5.1)
Sodium: 133 mmol/L — ABNORMAL LOW (ref 135–145)

## 2023-05-06 LAB — HEMOGLOBIN A1C
Hgb A1c MFr Bld: 7.4 % — ABNORMAL HIGH (ref 4.8–5.6)
Mean Plasma Glucose: 165.68 mg/dL

## 2023-05-06 LAB — GLUCOSE, CAPILLARY
Glucose-Capillary: 407 mg/dL — ABNORMAL HIGH (ref 70–99)
Glucose-Capillary: 475 mg/dL — ABNORMAL HIGH (ref 70–99)
Glucose-Capillary: 367 mg/dL — ABNORMAL HIGH (ref 70–99)

## 2023-05-06 MED ORDER — ALPRAZOLAM 0.25 MG PO TABS: 0.2500 mg | ORAL_TABLET | Freq: Three times a day (TID) | ORAL | Status: DC | PRN

## 2023-05-06 MED ORDER — INSULIN ASPART 100 UNIT/ML IJ SOLN
4.0000 [IU] | Freq: Three times a day (TID) | INTRAMUSCULAR | Status: DC
  Administered 2023-05-07: 4 [IU] via SUBCUTANEOUS
  Filled 2023-05-06: qty 1

## 2023-05-06 MED ORDER — AMIODARONE HCL IN DEXTROSE 360-4.14 MG/200ML-% IV SOLN
30.0000 mg/h | INTRAVENOUS | Status: DC
  Administered 2023-05-07: 30 mg/h via INTRAVENOUS
  Filled 2023-05-06 (×2): qty 200

## 2023-05-06 MED ORDER — AMIODARONE IV BOLUS ONLY 150 MG/100ML
150.0000 mg | Freq: Once | INTRAVENOUS | Status: AC
  Administered 2023-05-06: 150 mg via INTRAVENOUS
  Filled 2023-05-06: qty 100

## 2023-05-06 MED ORDER — MAGNESIUM SULFATE 2 GM/50ML IV SOLN
2.0000 g | Freq: Once | INTRAVENOUS | Status: AC
  Administered 2023-05-06: 2 g via INTRAVENOUS
  Filled 2023-05-06: qty 50

## 2023-05-06 MED ORDER — LEVALBUTEROL HCL 1.25 MG/0.5ML IN NEBU
1.2500 mg | INHALATION_SOLUTION | Freq: Three times a day (TID) | RESPIRATORY_TRACT | Status: DC
  Administered 2023-05-06: 1.25 mg via RESPIRATORY_TRACT
  Filled 2023-05-06: qty 0.5

## 2023-05-06 MED ORDER — PREDNISONE 20 MG PO TABS
30.0000 mg | ORAL_TABLET | Freq: Every day | ORAL | Status: DC
  Administered 2023-05-06: 30 mg via ORAL
  Filled 2023-05-06: qty 1

## 2023-05-06 MED ORDER — LEVALBUTEROL HCL 1.25 MG/0.5ML IN NEBU
1.2500 mg | INHALATION_SOLUTION | Freq: Three times a day (TID) | RESPIRATORY_TRACT | Status: DC
  Administered 2023-05-06 – 2023-05-09 (×8): 1.25 mg via RESPIRATORY_TRACT
  Filled 2023-05-06 (×8): qty 0.5

## 2023-05-06 MED ORDER — PREDNISONE 20 MG PO TABS
20.0000 mg | ORAL_TABLET | Freq: Every day | ORAL | Status: DC
  Administered 2023-05-07 – 2023-05-09 (×3): 20 mg via ORAL
  Filled 2023-05-06 (×3): qty 1

## 2023-05-06 MED ORDER — INSULIN ASPART 100 UNIT/ML IJ SOLN
0.0000 [IU] | Freq: Every day | INTRAMUSCULAR | Status: DC
  Administered 2023-05-06: 5 [IU] via SUBCUTANEOUS
  Administered 2023-05-07: 3 [IU] via SUBCUTANEOUS
  Administered 2023-05-08: 2 [IU] via SUBCUTANEOUS
  Filled 2023-05-06 (×3): qty 1

## 2023-05-06 MED ORDER — AMIODARONE HCL IN DEXTROSE 360-4.14 MG/200ML-% IV SOLN
60.0000 mg/h | INTRAVENOUS | Status: AC
  Administered 2023-05-06 (×2): 60 mg/h via INTRAVENOUS
  Filled 2023-05-06: qty 200

## 2023-05-06 MED ORDER — INSULIN GLARGINE-YFGN 100 UNIT/ML ~~LOC~~ SOLN
10.0000 [IU] | Freq: Every day | SUBCUTANEOUS | Status: DC
  Administered 2023-05-06 – 2023-05-09 (×4): 10 [IU] via SUBCUTANEOUS
  Filled 2023-05-06 (×4): qty 0.1

## 2023-05-06 MED ORDER — DILTIAZEM HCL 25 MG/5ML IV SOLN
15.0000 mg | Freq: Once | INTRAVENOUS | Status: AC
  Administered 2023-05-06: 15 mg via INTRAVENOUS
  Filled 2023-05-06: qty 5

## 2023-05-06 MED ORDER — AMIODARONE HCL 200 MG PO TABS: 400.0000 mg | ORAL_TABLET | Freq: Two times a day (BID) | ORAL | Status: DC

## 2023-05-06 MED ORDER — LORAZEPAM 2 MG/ML IJ SOLN
0.5000 mg | Freq: Once | INTRAMUSCULAR | Status: AC
  Administered 2023-05-06: 0.5 mg via INTRAVENOUS
  Filled 2023-05-06: qty 1

## 2023-05-06 MED ORDER — INSULIN ASPART 100 UNIT/ML IJ SOLN
0.0000 [IU] | Freq: Three times a day (TID) | INTRAMUSCULAR | Status: DC
  Administered 2023-05-06 – 2023-05-07 (×4): 9 [IU] via SUBCUTANEOUS
  Administered 2023-05-07: 7 [IU] via SUBCUTANEOUS
  Administered 2023-05-08: 3 [IU] via SUBCUTANEOUS
  Administered 2023-05-08: 2 [IU] via SUBCUTANEOUS
  Administered 2023-05-08 – 2023-05-09 (×2): 1 [IU] via SUBCUTANEOUS
  Filled 2023-05-06 (×10): qty 1

## 2023-05-06 MED ORDER — INSULIN ASPART 100 UNIT/ML IJ SOLN
4.0000 [IU] | Freq: Once | INTRAMUSCULAR | Status: AC
  Administered 2023-05-06: 4 [IU] via SUBCUTANEOUS

## 2023-05-06 MED ORDER — AMIODARONE LOAD VIA INFUSION
150.0000 mg | Freq: Once | INTRAVENOUS | Status: AC
  Administered 2023-05-06: 150 mg via INTRAVENOUS
  Filled 2023-05-06: qty 83.34

## 2023-05-06 NOTE — Inpatient Diabetes Management (Signed)
Inpatient Diabetes Program Recommendations  AACE/ADA: New Consensus Statement on Inpatient Glycemic Control (2015)  Target Ranges:  Prepandial:   less than 140 mg/dL      Peak postprandial:   less than 180 mg/dL (1-2 hours)      Critically ill patients:  140 - 180 mg/dL   Lab Results  Component Value Date   GLUCAP 231 (H) 04/30/2023   HGBA1C 6.8 (H) 03/16/2021    Review of Glycemic Control  Latest Reference Range & Units 04/30/23 08:13  Glucose-Capillary 70 - 99 mg/dL 161 (H)    Latest Reference Range & Units 05/04/23 04:32 05/06/23 07:38  Glucose 70 - 99 mg/dL 096 (H) 045 (H)   Diabetes history: DM 2 Outpatient Diabetes medications: no meds at home Current orders for Inpatient glycemic control:  Semglee 10 units Novolog 0-9 units tid + hs  Solumedrol 80 mg x1 yesterday 2/3 Ensure Enlive bid between meals (40 grams of carbohydrates) PO prednisone 30 mg Daily (first dose on 2/4)  Inpatient Diabetes Program Recommendations:    -   Add Novolog 4 units tid meal/supplement coverage if eating >50% of meals  Thanks,  Christena Deem RN, MSN, BC-ADM Inpatient Diabetes Coordinator Team Pager [phone] (8a-5p)

## 2023-05-06 NOTE — Plan of Care (Signed)
  Problem: Education: Goal: Knowledge of General Education information will improve Description: Including pain rating scale, medication(s)/side effects and non-pharmacologic comfort measures Outcome: Progressing   Problem: Health Behavior/Discharge Planning: Goal: Ability to manage health-related needs will improve Outcome: Progressing   Problem: Clinical Measurements: Goal: Ability to maintain clinical measurements within normal limits will improve Outcome: Progressing Goal: Will remain free from infection Outcome: Progressing Goal: Diagnostic test results will improve Outcome: Progressing Goal: Respiratory complications will improve Outcome: Progressing Goal: Cardiovascular complication will be avoided Outcome: Progressing   Problem: Activity: Goal: Risk for activity intolerance will decrease Outcome: Progressing   Problem: Nutrition: Goal: Adequate nutrition will be maintained Outcome: Progressing   Problem: Coping: Goal: Level of anxiety will decrease Outcome: Progressing   Problem: Elimination: Goal: Will not experience complications related to bowel motility Outcome: Progressing Goal: Will not experience complications related to urinary retention Outcome: Progressing   Problem: Pain Managment: Goal: General experience of comfort will improve and/or be controlled Outcome: Progressing   Problem: Safety: Goal: Ability to remain free from injury will improve Outcome: Progressing   Problem: Skin Integrity: Goal: Risk for impaired skin integrity will decrease Outcome: Progressing   Problem: Education: Goal: Ability to demonstrate management of disease process will improve Outcome: Progressing Goal: Ability to verbalize understanding of medication therapies will improve Outcome: Progressing Goal: Individualized Educational Video(s) Outcome: Progressing   Problem: Activity: Goal: Capacity to carry out activities will improve Outcome: Progressing    Problem: Cardiac: Goal: Ability to achieve and maintain adequate cardiopulmonary perfusion will improve Outcome: Progressing   Problem: Education: Goal: Knowledge of disease or condition will improve Outcome: Progressing Goal: Knowledge of the prescribed therapeutic regimen will improve Outcome: Progressing Goal: Individualized Educational Video(s) Outcome: Progressing   Problem: Activity: Goal: Ability to tolerate increased activity will improve Outcome: Progressing Goal: Will verbalize the importance of balancing activity with adequate rest periods Outcome: Progressing   Problem: Respiratory: Goal: Ability to maintain a clear airway will improve Outcome: Progressing Goal: Levels of oxygenation will improve Outcome: Progressing Goal: Ability to maintain adequate ventilation will improve Outcome: Progressing   Problem: Activity: Goal: Ability to tolerate increased activity will improve Outcome: Progressing   Problem: Clinical Measurements: Goal: Ability to maintain a body temperature in the normal range will improve Outcome: Progressing   Problem: Respiratory: Goal: Ability to maintain adequate ventilation will improve Outcome: Progressing Goal: Ability to maintain a clear airway will improve Outcome: Progressing

## 2023-05-06 NOTE — TOC Progression Note (Signed)
 Transition of Care [facility]) - Progression Note    Patient Details  Name: Raymond Hays MRN: [id number] Date of Birth: 1946-01-06  [facility]) CM/SW Contact  Tomasa JAYSON [name], RN Phone Number: 05/06/2023, 10:04 AM  Clinical Narrative:    GEC form sent to [facility] @ [email or web address]   Expected Discharge Plan: Long Term Acute Care (LTAC) Barriers to Discharge: Continued Medical Work up  Expected Discharge Plan and Services   Discharge Planning Services: Other - See comment (LTC facility)   Living arrangements for the past 2 months: Skilled Nursing Facility ([facility] Term Care at [facility])                 DME Arranged: N/A DME Agency: NA       HH Arranged: NA HH Agency: NA         Social Determinants of Health (SDOH) Interventions SDOH Screenings   Food Insecurity: No Food Insecurity (05/01/2023)  Housing: Unknown (05/02/2023)  Transportation Needs: No Transportation Needs (05/02/2023)  Utilities: Not At Risk (05/01/2023)  Depression (PHQ2-9): Low Risk  (09/25/2022)  Financial Resource Strain: Low Risk  (05/02/2023)  Social Connections: Unknown (05/01/2023)  Tobacco Use: Medium Risk (04/29/2023)    Readmission Risk Interventions    03/12/2023    2:19 PM 01/12/2021    1:08 PM 10/20/2020    8:59 AM  Readmission Risk Prevention Plan  Transportation Screening  Complete Complete  PCP or Specialist Appt within 3-5 Days   Complete  HRI or Home Care Consult   Complete  Social Work Consult for Recovery Care Planning/Counseling     Palliative Care Screening   Not Applicable  Medication Review Oceanographer)  Complete Referral to Pharmacy  PCP or Specialist appointment within 3-5 days of discharge  Complete   HRI or Home Care Consult  Complete   SW Recovery Care/Counseling Consult  Complete   Palliative Care Screening  Not Applicable   Skilled Nursing Facility  Complete      Information is confidential and restricted. Go to Review  Flowsheets to unlock data.

## 2023-05-06 NOTE — Progress Notes (Signed)
 Progress Note   Patient: Raymond Hays FMW:[id number] DOB: 04-Jul-1945 DOA: 04/29/2023     7 DOS: the patient was seen and examined on 05/06/2023   Brief hospital course: 78 year old man past medical history of COPD on chronic 2 L of oxygen, hypertension, hyperlipidemia, diastolic congestive heart failure, suprapubic Foley catheter, neurogenic bladder, atrial fibrillation on Eliquis , spinal stenosis, wheelchair-bound, Parkinson's disease, hypertension, diet-controlled diabetes, hypothyroidism, gout, depression, anxiety, BPH, anemia who presented with altered mental status and shortness of breath.  He was placed on BiPAP given Lasix  and antibiotics and steroids.  Surgical consultation for distended abdomen likely secondary to constipation and/or ileus.  1/29.  Patient had altered mental status this morning MRI of the brain was done which showed no acute intracranial abnormality.  Patient off BiPAP and on oxygen upon reevaluation. 1/30.  Abdomen still distended.  Patient not having abdominal pain.  Surgeon did a rectal exam in the evening and disimpacted and had a lot of gas that came out.  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 1/31.  Abdomen less distended today.  Patient did have a bowel movement this morning.  Continue enemas in the evening will give a suppository.  Continue lactulose  and MiraLAX . 2/1.  Patient had multiple bowel movements.  Diet advanced to solid food. 2/2.  Patient asking when he can go back to his facility.  States he always has a wheeze. 2/3.  Patient asked me to call his sister.  Awaiting approval to go back to his facility.  Added amiodarone  for increased heart rate 2/4.  Heart rate increased again today.  Placed on amiodarone  drip this afternoon since I do not hear back about insurance authorization.  Assessment and Plan: * Paroxysmal atrial fibrillation with rapid ventricular response (HCC) Increased dose of Cardizem  CD to 360 mg daily.  This afternoon added amiodarone  drip since heart  rate still elevated.  Will give IV magnesium .  Patient very upset about still being in the hospital and getting himself worked up.  Likely part of the reason why his heart rate is up.  Acute on chronic respiratory failure with hypoxia (HCC) Came in with respiratory distress and tachypnea placed on BiPAP.  Chronically wears 2 L of oxygen.  Now on room air.    COPD exacerbation (HCC) Completed Solu-Medrol  continue inhalers.  Nebulizer switched over to Xopenex  with fast heart rate.  Restarted prednisone  at a lower dose and will taper.  Lungs are clear when he breathes through his nose and sits up.  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  Continue solid food.  Abdomen less distended than admission.  Continue MiraLAX  to twice daily.  Continue lactulose  daily.  Continue enema at night.  Surgical team does not think he would survive a surgical procedure.  Multifocal pneumonia Bilateral pneumonia seen on CT scan.  Completed antibiotics  Severe sepsis (HCC) Present on admission with leukocytosis tachycardia and tachypnea and multifocal pneumonia and acute on chronic respiratory failure.  Completed antibiotics  Acute on chronic diastolic CHF (congestive heart failure) (HCC) Changed to oral Lasix .  Appears euvolemic.  Last EF 55%.  Still has right lower extremity swelling worse than the left.  Acute metabolic encephalopathy MRI of the brain negative for stroke.  Mental status much improved from admission.  Essential hypertension Continue oral Cardizem   Hypothyroidism Continue levothyroxine   Parkinson disease (HCC) Continue Sinemet   HLD (hyperlipidemia) Continue Lipitor  Depression with anxiety Continue home medication  Obesity (BMI 30-39.9) BMI 32.80       Subjective: Patient upset that he is still here.  Wanted to go back to his facility.  Heart rate last 2 days have been creeping up.  Needed to increase Cardizem  and then added amiodarone .  This afternoon added amiodarone   drip.  Physical Exam: Vitals:   05/06/23 1330 05/06/23 1340 05/06/23 1600 05/06/23 1702  BP: 125/89 137/81 (!) 142/90   Pulse: (!) 127 (!) 121  (!) 120  Resp: 16 18  20   Temp:    98 F (36.7 C)  TempSrc:    Oral  SpO2: 98% 96% 97%   Weight:      Height:       Physical Exam HENT:     Head: Normocephalic.  Eyes:     General: Lids are normal.     Conjunctiva/sclera: Conjunctivae normal.  Cardiovascular:     Rate and Rhythm: Tachycardia present. Rhythm irregularly irregular.     Heart sounds: Normal heart sounds, S1 normal and S2 normal.  Pulmonary:     Breath sounds: Transmitted upper airway sounds present. Examination of the right-lower field reveals decreased breath sounds and wheezing. Examination of the left-lower field reveals decreased breath sounds and wheezing. Decreased breath sounds and wheezing present. No rhonchi or rales.  Abdominal:     Palpations: Abdomen is soft.     Tenderness: There is no abdominal tenderness.     Comments: Abdomen less distended today.  Musculoskeletal:     Right lower leg: No swelling.     Left lower leg: No swelling.  Skin:    General: Skin is warm.     Findings: No rash.  Neurological:     Mental Status: He is alert.     Data Reviewed: Sodium 133, creatinine 0.99, white blood count 14.9, hemoglobin 14.1, platelet count 301.  Family Communication: Spoke with Sister Almarie  Disposition: Status is: Inpatient Remains inpatient appropriate because: Patient working himself up and is very anxious about not being able to go back to his facility and his heart rate is up.  Started amiodarone  drip this afternoon because I did not hear anything back on insurance.  Planned Discharge Destination: Long-term care    Time spent: 28 minutes  Author: Charlie Patterson, MD 05/06/2023 5:13 PM  For on call review [email or web address].

## 2023-05-07 ENCOUNTER — Other Ambulatory Visit (HOSPITAL_COMMUNITY): Payer: Self-pay

## 2023-05-07 DIAGNOSIS — I480 Paroxysmal atrial fibrillation: Secondary | ICD-10-CM | POA: Diagnosis not present

## 2023-05-07 DIAGNOSIS — Z7189 Other specified counseling: Secondary | ICD-10-CM

## 2023-05-07 LAB — GLUCOSE, CAPILLARY
Glucose-Capillary: 327 mg/dL — ABNORMAL HIGH (ref 70–99)
Glucose-Capillary: 479 mg/dL — ABNORMAL HIGH (ref 70–99)
Glucose-Capillary: 381 mg/dL — ABNORMAL HIGH (ref 70–99)
Glucose-Capillary: 288 mg/dL — ABNORMAL HIGH (ref 70–99)

## 2023-05-07 MED ORDER — CARVEDILOL 12.5 MG PO TABS
12.5000 mg | ORAL_TABLET | Freq: Once | ORAL | Status: AC
  Administered 2023-05-07: 12.5 mg via ORAL
  Filled 2023-05-07: qty 1

## 2023-05-07 MED ORDER — METOPROLOL TARTRATE 5 MG/5ML IV SOLN: 5.0000 mg | Freq: Once | INTRAVENOUS | Status: DC | PRN

## 2023-05-07 MED ORDER — CARVEDILOL 25 MG PO TABS
25.0000 mg | ORAL_TABLET | Freq: Two times a day (BID) | ORAL | Status: DC
Start: 2023-05-08 — End: 2023-05-08
  Filled 2023-05-07: qty 1

## 2023-05-07 MED ORDER — ALUM & MAG HYDROXIDE-SIMETH 200-200-20 MG/5ML PO SUSP
30.0000 mL | Freq: Once | ORAL | Status: AC
Start: 2023-05-07 — End: 2023-05-07
  Administered 2023-05-07: 30 mL via ORAL
  Filled 2023-05-07: qty 30

## 2023-05-07 MED ORDER — INSULIN ASPART 100 UNIT/ML IJ SOLN
7.0000 [IU] | Freq: Three times a day (TID) | INTRAMUSCULAR | Status: DC
  Administered 2023-05-07 – 2023-05-09 (×7): 7 [IU] via SUBCUTANEOUS
  Filled 2023-05-07 (×8): qty 1

## 2023-05-07 MED ORDER — CARVEDILOL 12.5 MG PO TABS
12.5000 mg | ORAL_TABLET | Freq: Two times a day (BID) | ORAL | Status: DC
  Administered 2023-05-07: 12.5 mg via ORAL
  Filled 2023-05-07: qty 1

## 2023-05-07 MED ORDER — PANTOPRAZOLE SODIUM 40 MG PO TBEC
40.0000 mg | DELAYED_RELEASE_TABLET | Freq: Every day | ORAL | Status: DC
Start: 2023-05-07 — End: 2023-05-09
  Administered 2023-05-07 – 2023-05-09 (×3): 40 mg via ORAL
  Filled 2023-05-07 (×3): qty 1

## 2023-05-07 NOTE — Inpatient Diabetes Management (Signed)
 Inpatient Diabetes Program Recommendations  AACE/ADA: New Consensus Statement on Inpatient Glycemic Control (2015)  Target Ranges:  Prepandial:   less than 140 mg/dL      Peak postprandial:   less than 180 mg/dL (1-2 hours)      Critically ill patients:  140 - 180 mg/dL    Latest Reference Range & Units 04/30/23 08:13 05/06/23 12:59 05/06/23 17:00 05/06/23 21:21  Glucose-Capillary 70 - 99 mg/dL 768 (H) 592 (H)  9 units Novolog   10 units Semglee  @1038  475 (H)  13 units Novolog   367 (H)  5 units Novolog    (H): Data is abnormally high  Latest Reference Range & Units 05/07/23 08:44 05/07/23 11:47  Glucose-Capillary 70 - 99 mg/dL 672 (H)  11 units Novolog   10 units Semglee  479 (H)  (H): Data is abnormally high     Home DM Meds: None Listed  Current Orders: Semglee  10 units Daily     Novolog  Sensitive Correction Scale/ SSI (0-9 units) TID AC + HS     Novolog  7 units TID with meals    MD- Note pt getting Prednisone   Note Novolog  Meal Coverage dose increased today  If AM CBGs remain elevated, may also consider increasing the Semglee  insulin  to 15 units Daily     --Will follow patient during hospitalization--  Adina Rudolpho Arrow RN, MSN, CDCES Diabetes Coordinator Inpatient Glycemic Control Team Team Pager: [phone] (8a-5p)

## 2023-05-07 NOTE — Progress Notes (Signed)
 PROGRESS NOTE    Raymond Hays   FMW:[id number] DOB: [birth year]  DOA: 04/29/2023 Date of Service: 05/07/23 which is hospital day 8  PCP: Isadora Delon BRAVO, MD    Hospital course / significant events:   HPI: 78 year old man past medical history of COPD on chronic 2 L of oxygen, hypertension, hyperlipidemia, diastolic congestive heart failure, suprapubic Foley catheter, neurogenic bladder, atrial fibrillation on Eliquis , spinal stenosis, wheelchair-bound, Parkinson's disease, hypertension, diet-controlled diabetes, hypothyroidism, gout, depression, anxiety, BPH, anemia who presented to ED from SNF/LTC with altered mental status and shortness of breath.    01/28: to ED, He was placed on BiPAP given Lasix  and antibiotics and steroids.  Surgical consultation for distended abdomen likely secondary to constipation and/or ileus. Admitted to hospitalist for repsiratoryfailure, CHF, COPD, PNA, sepsis, ileus, Afib RVR, AMS.  01/29.  Patient had altered mental status this morning MRI of the brain was done which showed no acute intracranial abnormality.  Patient off BiPAP and on oxygen upon reevaluation.  01/30.  Abdomen still distended, no abdominal pain.  Surgeon did a rectal exam in the evening and disimpacted and had a lot of gas that came out.  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 01/31.  Abdomen less distended today.  Patient did have a bowel movement this morning.  Continue enemas in the evening will give a suppository.  Continue lactulose  and MiraLAX . 02/01.  Patient had multiple bowel movements.  Diet advanced to solid food. 02/02.  Patient asking when he can go back to his facility.  States he always has a wheeze 02/03.  Patient asked me to call his sister.  Awaiting approval to go back to his facility.  Added amiodarone  for increased heart rate 02/04.  Heart rate increased again today.  Placed on amiodarone  drip this afternoon since we did not hear back about insurance authorization. 02/05: on amio gtt  this morning. Started on po beta blocker goal to get off amio drip - pt would like discharge asap.      Consultants:  General surgery  Neurology   Procedures/Surgeries: none      ASSESSMENT & PLAN:   Paroxysmal atrial fibrillation with rapid ventricular response  Patient very upset about still being in the hospital and getting himself worked up.  Likely part of the reason why his heart rate is up. Increased dose of Cardizem  CD to 360 mg daily.   Added beta blocker today  Yesterday afternoon added amiodarone  drip since heart rate still elevated - goal to d/c this today and anticipate d/c tomorrow if HR remains stable    IV magnesium .     Acute on chronic respiratory failure with hypoxia  Came in with respiratory distress and tachypnea placed on BiPAP.   Chronically wears 2 L of oxygen.  Now on room air.   Supplemental O2 as needed    COPD exacerbation  Completed Solu-Medrol  continue inhalers.  Nebulizer switched over to Xopenex  with fast heart rate.  Restarted prednisone  at a lower dose and will taper.  Lungs are clear when he breathes through his nose and sits up.   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   Abdomen less distended than admission.  Transition to full liquids per patient/family request, ok for solid food if he decides he wants this. Maalox/Mylanta x1 today Adding PPI  Continue MiraLAX  to twice daily.   Continue lactulose  daily.   Continue enema at night.   Surgical team does not think he would survive a surgical procedure.   Severe sepsis present on admission  d/t Multifocal pneumonia Bilateral pneumonia seen on CT scan.   Completed antibiotics Reassess as needed   Acute on chronic diastolic CHF (congestive heart failure) Essential HTN  Appears euvolemic.  Last EF 55%.  Still has right lower extremity swelling worse than the left. Continue meds: statin, carvedilol  (added today), lasix ,  Diltiazem  not ideal but given issues w/ rate control  will leave on for now and titrate beta blocker  Ideally would be on ARB but will hold for now while titrating beta blocker and cardizem     Acute metabolic encephalopathy likely d/t sepsis - resolved  MRI of the brain negative for stroke.  Mental status much improved from admission. Reassess as needed    Hypothyroidism Continue levothyroxine    Parkinson disease Continue Sinemet    HLD (hyperlipidemia) Continue Lipitor   Depression with anxiety Continue home medication   Advanced care planning  Code Status: FULL CODE - discussed w/ patient today 05/07/23 re: resuscitation and life support, likelihood of survival on life support even if cardiac arrest w/ ROSC which is low given his multiple comorbids. He reiterates he'd like to be FULL CODE and requests CPR/intubation if needed however he would not want to be on life support more than 2-3 days if he is not getting better / weaning off in that time.  ACP documentation reviewed: MOST on file in [facility] was VOIDED d/t update as above   Class 1 obesity based on BMI: Body mass index is 32.49 kg/m.  Underweight - under 18  overweight - 25 to 29 obese - 30 or more Class 1 obesity: BMI of 30.0 to 34 Class 2 obesity: BMI of 35.0 to 39 Class 3 obesity: BMI of 40.0 to 49 Super Morbid Obesity: BMI 50-59 Super-super Morbid Obesity: BMI 60+ Significantly low or high BMI is associated with higher medical risk.  Weight management advised as adjunct to other disease management and risk reduction treatments    DVT prophylaxis: eliquis   IV fluids: no continuous IV fluids  Nutrition: full liquids per patient/son request but can have solids if he'd like  Central lines / invasive devices: none  Code Status: FULL CODE - discussed w/ patient today 05/07/23 see above ACP documentation reviewed: MOST on file in [facility] was VOIDED d/t update as above   TOC needs: none at this time  Barriers to dispo / significant pending items: rate control, anticipate  medically ready another 1-2 days              Subjective / Brief ROS:  Patient reports he wants to leave the hospital  Denies chest pain, palpitations Reports feels like he can't eat much d/t reflux problems  Son has concerns about patient's solid diet Pain controlled.  Denies new weakness.  Reports no concerns w/ urination/defecation.   Family Communication: spoke on bedside phone w/ son and daughter-in-law - pt was talking w/ son while I was rounding and I offered to chat w/ family as well     Objective Findings:  Vitals:   05/07/23 0802 05/07/23 0824 05/07/23 1426 05/07/23 1527  BP: (!) 127/97   (!) 145/90  Pulse: 86   100  Resp: 14   20  Temp: 97.7 F (36.5 C)   98.6 F (37 C)  TempSrc: Oral     SpO2: 98% 97% 99% 100%  Weight:      Height:        Intake/Output Summary (Last 24 hours) at 05/07/2023 1632 Last data filed at 05/07/2023 1534 Gross per 24  hour  Intake 976.97 ml  Output 1800 ml  Net -823.03 ml   Filed Weights   05/05/23 0518 05/06/23 0500 05/07/23 0500  Weight: 95.3 kg 95 kg 94.1 kg    Examination:  Physical Exam Constitutional:      General: He is not in acute distress.    Appearance: He is ill-appearing.  Cardiovascular:     Rate and Rhythm: Tachycardia present. Rhythm irregular.  Pulmonary:     Effort: Pulmonary effort is normal.     Breath sounds: Normal breath sounds.  Musculoskeletal:     Right lower leg: No edema.     Left lower leg: No edema.  Neurological:     Mental Status: He is alert and oriented to person, place, and time.  Psychiatric:        Mood and Affect: Mood normal.        Behavior: Behavior normal.          Scheduled Medications:   alum & mag hydroxide-simeth  30 mL Oral Once   apixaban   5 mg Oral BID   atorvastatin   20 mg Oral QHS   carbidopa -levodopa   1 tablet Oral TID AC   carvedilol   12.5 mg Oral BID WC   citalopram   10 mg Oral QHS   diltiazem   360 mg Oral Daily   famotidine   20 mg Oral Daily    feeding supplement  237 mL Oral BID BM   fluticasone  furoate-vilanterol  1 puff Inhalation Daily   And   umeclidinium bromide   1 puff Inhalation Daily   furosemide   40 mg Oral Daily   gabapentin   800 mg Oral QID   insulin  aspart  0-5 Units Subcutaneous QHS   insulin  aspart  0-9 Units Subcutaneous TID WC   insulin  aspart  7 Units Subcutaneous TID WC   insulin  glargine-yfgn  10 Units Subcutaneous Daily   lactulose   30 g Oral Daily   levalbuterol   1.25 mg Nebulization TID   levothyroxine   75 mcg Oral Q0600   mupirocin  ointment  1 Application Nasal BID   oxybutynin   5 mg Oral BID   pantoprazole   40 mg Oral Daily   polyethylene glycol  17 g Oral BID   predniSONE   20 mg Oral Q breakfast   senna-docusate  1 tablet Oral BID   sodium phosphate  1 enema Rectal QHS    Continuous Infusions:    PRN Medications:  acetaminophen , albuterol , ALPRAZolam , calcium  carbonate, dextromethorphan -guaiFENesin , hydrALAZINE , hydrocortisone  cream, hydrOXYzine , ondansetron  (ZOFRAN ) IV, mouth rinse, tiZANidine   Antimicrobials from admission:  Anti-infectives (From admission, onward)    Start     Dose/Rate Route Frequency Ordered Stop   05/04/23 1800  ceFEPIme  (MAXIPIME ) 2 g in sodium chloride  0.9 % 100 mL IVPB        2 g 200 mL/hr over 30 Minutes Intravenous Every 8 hours 05/04/23 0959 05/06/23 0846   05/01/23 0300  vancomycin  (VANCOREADY) IVPB 1250 mg/250 mL  Status:  Discontinued        1,250 mg 166.7 mL/hr over 90 Minutes Intravenous Every 24 hours 04/30/23 0238 04/30/23 1145   04/30/23 2300  vancomycin  (VANCOCIN ) IVPB 1000 mg/200 mL premix        1,000 mg 200 mL/hr over 60 Minutes Intravenous Daily 04/30/23 1145 05/04/23 2307   04/30/23 2200  ceFEPIme  (MAXIPIME ) 2 g in sodium chloride  0.9 % 100 mL IVPB  Status:  Discontinued        2 g 200 mL/hr over 30 Minutes Intravenous Every 12  hours 04/30/23 1138 05/04/23 0959   04/30/23 0245  ceFEPIme  (MAXIPIME ) 2 g in sodium chloride  0.9 % 100 mL IVPB   Status:  Discontinued        2 g 200 mL/hr over 30 Minutes Intravenous Every 8 hours 04/30/23 0232 04/30/23 1138   04/30/23 0230  vancomycin  (VANCOCIN ) IVPB 1000 mg/200 mL premix       Placed in Followed by Linked Group   1,000 mg 200 mL/hr over 60 Minutes Intravenous  Once 04/30/23 0227 04/30/23 0426   04/30/23 0230  vancomycin  (VANCOREADY) IVPB 1500 mg/300 mL       Placed in Followed by Linked Group   1,500 mg 150 mL/hr over 120 Minutes Intravenous  Once 04/30/23 0227 04/30/23 9367   04/29/23 1615  cefTRIAXone  (ROCEPHIN ) 2 g in sodium chloride  0.9 % 100 mL IVPB        2 g 200 mL/hr over 30 Minutes Intravenous Once 04/29/23 1611 04/29/23 1754   04/29/23 1615  azithromycin  (ZITHROMAX ) 500 mg in sodium chloride  0.9 % 250 mL IVPB        500 mg 250 mL/hr over 60 Minutes Intravenous  Once 04/29/23 1611 04/29/23 1942           Data Reviewed:  I have personally reviewed the following...  CBC: Recent Labs  Lab 05/01/23 0501 05/02/23 0540 05/03/23 0455 05/04/23 0432 05/06/23 0738  WBC 22.0* 12.9* 9.3 10.8* 14.9*  HGB 11.6* 11.6* 10.6* 11.8* 14.1  HCT 36.0* 35.3* 32.7* 35.4* 41.6  MCV 91.4 89.1 90.6 89.2 86.3  PLT 328 346 293 290 301   Basic Metabolic Panel: Recent Labs  Lab 05/01/23 0501 05/03/23 0455 05/04/23 0432 05/06/23 0738  NA 139 133* 132* 133*  K 3.7 4.5 4.4 3.8  CL 106 102 98 91*  CO2 22 22 26 29   GLUCOSE 199* 282* 322* 447*  BUN 53* 51* 39* 41*  CREATININE 1.29* 1.16 0.97 0.99  CALCIUM  9.1 9.0 9.1 8.9   GFR: Estimated Creatinine Clearance: 66.2 mL/min (by C-G formula based on SCr of 0.99 mg/dL). Liver Function Tests: No results for input(s): AST, ALT, ALKPHOS, BILITOT, PROT, ALBUMIN in the last 168 hours. No results for input(s): LIPASE, AMYLASE in the last 168 hours. Recent Labs  Lab 04/30/23 1724  AMMONIA 13   Coagulation Profile: Recent Labs  Lab 04/30/23 1724  INR 1.7*   Cardiac Enzymes: No results for input(s):  CKTOTAL, CKMB, CKMBINDEX, TROPONINI in the last 168 hours. BNP (last 3 results) No results for input(s): PROBNP in the last 8760 hours. HbA1C: Recent Labs    05/06/23 0738  HGBA1C 7.4*   CBG: Recent Labs  Lab 05/06/23 1700 05/06/23 2121 05/07/23 0844 05/07/23 1147 05/07/23 1529  GLUCAP 475* 367* 327* 479* 381*   Lipid Profile: No results for input(s): CHOL, HDL, LDLCALC, TRIG, CHOLHDL, LDLDIRECT in the last 72 hours. Thyroid Function Tests: No results for input(s): TSH, T4TOTAL, FREET4, T3FREE, THYROIDAB in the last 72 hours. Anemia Panel: No results for input(s): VITAMINB12, FOLATE, FERRITIN, TIBC, IRON, RETICCTPCT in the last 72 hours. Most Recent Urinalysis On File:     Component Value Date/Time   COLORURINE YELLOW (A) 09/14/2022 1228   APPEARANCEUR CLOUDY (A) 09/14/2022 1228   LABSPEC 1.017 09/14/2022 1228   PHURINE 8.0 09/14/2022 1228   GLUCOSEU NEGATIVE 09/14/2022 1228   HGBUR SMALL (A) 09/14/2022 1228   BILIRUBINUR NEGATIVE 09/14/2022 1228   KETONESUR NEGATIVE 09/14/2022 1228   PROTEINUR 100 (A) 09/14/2022 1228   NITRITE NEGATIVE  09/14/2022 1228   LEUKOCYTESUR LARGE (A) 09/14/2022 1228   Sepsis Labs: @LABRCNTIP (procalcitonin:4,lacticidven:4) Microbiology: Recent Results (from the past 240 hours)  Resp panel by RT-PCR (RSV, Flu A&B, Covid) Anterior Nasal Swab     Status: None   Collection Time: 04/29/23  3:28 PM   Specimen: Anterior Nasal Swab  Result Value Ref Range Status   SARS Coronavirus 2 by RT PCR NEGATIVE NEGATIVE Final    Comment: (NOTE) SARS-CoV-2 target nucleic acids are NOT DETECTED.  The SARS-CoV-2 RNA is generally detectable in upper respiratory specimens during the acute phase of infection. The lowest concentration of SARS-CoV-2 viral copies this assay can detect is 138 copies/mL. A negative result does not preclude SARS-Cov-2 infection and should not be used as the sole basis for treatment  or other patient management decisions. A negative result may occur with  improper specimen collection/handling, submission of specimen other than nasopharyngeal swab, presence of viral mutation(s) within the areas targeted by this assay, and inadequate number of viral copies(<138 copies/mL). A negative result must be combined with clinical observations, patient history, and epidemiological information. The expected result is Negative.  Fact Sheet for Patients:  bloggercourse.com  Fact Sheet for Healthcare Providers:  seriousbroker.it  This test is no t yet approved or cleared by the [place]  FDA and  has been authorized for detection and/or diagnosis of SARS-CoV-2 by FDA under an Emergency Use Authorization (EUA). This EUA will remain  in effect (meaning this test can be used) for the duration of the COVID-19 declaration under Section 564(b)(1) of the Act, 21 U.S.C.section 360bbb-3(b)(1), unless the authorization is terminated  or revoked sooner.       Influenza A by PCR NEGATIVE NEGATIVE Final   Influenza B by PCR NEGATIVE NEGATIVE Final    Comment: (NOTE) The Xpert Xpress SARS-CoV-2/FLU/RSV plus assay is intended as an aid in the diagnosis of influenza from Nasopharyngeal swab specimens and should not be used as a sole basis for treatment. Nasal washings and aspirates are unacceptable for Xpert Xpress SARS-CoV-2/FLU/RSV testing.  Fact Sheet for Patients: bloggercourse.com  Fact Sheet for Healthcare Providers: seriousbroker.it  This test is not yet approved or cleared by the [place]  FDA and has been authorized for detection and/or diagnosis of SARS-CoV-2 by FDA under an Emergency Use Authorization (EUA). This EUA will remain in effect (meaning this test can be used) for the duration of the COVID-19 declaration under Section 564(b)(1) of the Act, 21 U.S.C. section  360bbb-3(b)(1), unless the authorization is terminated or revoked.     Resp Syncytial Virus by PCR NEGATIVE NEGATIVE Final    Comment: (NOTE) Fact Sheet for Patients: bloggercourse.com  Fact Sheet for Healthcare Providers: seriousbroker.it  This test is not yet approved or cleared by the [place]  FDA and has been authorized for detection and/or diagnosis of SARS-CoV-2 by FDA under an Emergency Use Authorization (EUA). This EUA will remain in effect (meaning this test can be used) for the duration of the COVID-19 declaration under Section 564(b)(1) of the Act, 21 U.S.C. section 360bbb-3(b)(1), unless the authorization is terminated or revoked.  Performed at [facility], [address] [place], [place] [address]   Culture, blood (routine x 2)     Status: None   Collection Time: 04/29/23  4:38 PM   Specimen: BLOOD  Result Value Ref Range Status   Specimen Description BLOOD BLOOD RIGHT ARM  Final   Special Requests   Final    BOTTLES DRAWN AEROBIC AND ANAEROBIC Blood Culture adequate  volume   Culture   Final    NO GROWTH 5 DAYS Performed at [facility], [address] [place], [place] [address]    Report Status 05/04/2023 FINAL  Final  Culture, blood (routine x 2)     Status: None   Collection Time: 04/29/23  4:38 PM   Specimen: BLOOD  Result Value Ref Range Status   Specimen Description BLOOD BLOOD LEFT ARM  Final   Special Requests   Final    BOTTLES DRAWN AEROBIC AND ANAEROBIC Blood Culture adequate volume   Culture   Final    NO GROWTH 5 DAYS Performed at [facility], [address] [place], [place] [address]    Report Status 05/04/2023 FINAL  Final  Respiratory (~20 pathogens) panel by PCR     Status: None   Collection Time: 05/01/23  9:00 AM   Specimen: Nasopharyngeal Swab; Respiratory  Result Value Ref Range Status   Adenovirus NOT DETECTED NOT DETECTED Final   Coronavirus  229E NOT DETECTED NOT DETECTED Final    Comment: (NOTE) The Coronavirus on the Respiratory Panel, DOES NOT test for the novel  Coronavirus (2019 nCoV)    Coronavirus HKU1 NOT DETECTED NOT DETECTED Final   Coronavirus NL63 NOT DETECTED NOT DETECTED Final   Coronavirus OC43 NOT DETECTED NOT DETECTED Final   Metapneumovirus NOT DETECTED NOT DETECTED Final   Rhinovirus / Enterovirus NOT DETECTED NOT DETECTED Final   Influenza A NOT DETECTED NOT DETECTED Final   Influenza B NOT DETECTED NOT DETECTED Final   Parainfluenza Virus 1 NOT DETECTED NOT DETECTED Final   Parainfluenza Virus 2 NOT DETECTED NOT DETECTED Final   Parainfluenza Virus 3 NOT DETECTED NOT DETECTED Final   Parainfluenza Virus 4 NOT DETECTED NOT DETECTED Final   Respiratory Syncytial Virus NOT DETECTED NOT DETECTED Final   Bordetella pertussis NOT DETECTED NOT DETECTED Final   Bordetella Parapertussis NOT DETECTED NOT DETECTED Final   Chlamydophila pneumoniae NOT DETECTED NOT DETECTED Final   Mycoplasma pneumoniae NOT DETECTED NOT DETECTED Final    Comment: Performed at [facility], [address], [place], [place] [address]  MRSA Next Gen by PCR, Nasal     Status: Abnormal   Collection Time: 05/01/23  9:00 AM  Result Value Ref Range Status   MRSA by PCR Next Gen DETECTED (A) NOT DETECTED Final    Comment: RESULT CALLED TO, READ BACK BY AND VERIFIED WITH: MANUELITA KA RN 1212 05/01/23 HNM Performed at [facility], [address], [place], [place] [address]       Radiology Studies last 3 days: No results found.     Time spent: 50 min     Annaliah Rivenbark, DO [facility] 05/07/2023, 4:32 PM    Dictation software may have been used to generate the above note. Typos may occur and escape review in typed/dictated notes. Please contact Dr Marsa directly for clarity if needed.  Staff may message me via secure chat in Epic  but this may not receive an immediate response,  please page  me for urgent matters!  If 7PM-7AM, please contact night coverage [email or web address]

## 2023-05-07 NOTE — TOC Progression Note (Addendum)
 Transition of Care [facility]) - Progression Note    Patient Details  Name: Raymond Hays MRN: [id number] Date of Birth: 05-Oct-1945  Transition of Care [facility]) CM/SW Contact  Tomasa JAYSON [name], RN Phone Number: 05/07/2023, 12:57 PM  Clinical Narrative:    [name] SW @ [phone] ext [phone] at [facility] for update on auth. No answer. Left a message.   1:00pm Retrieved call back from [place] at [facility]. She is going to check to see if [place] auth was received. RNCM advised auth was sent to : [email or web address]   Expected Discharge Plan: Long Term Acute Care (LTAC) Barriers to Discharge: Continued Medical Work up  Expected Discharge Plan and Services   Discharge Planning Services: Other - See comment (LTC facility)   Living arrangements for the past 2 months: Skilled Nursing Facility ([facility] Term Care at [facility])                 DME Arranged: N/A DME Agency: NA       HH Arranged: NA HH Agency: NA         Social Determinants of Health (SDOH) Interventions SDOH Screenings   Food Insecurity: No Food Insecurity (05/01/2023)  Housing: Unknown (05/02/2023)  Transportation Needs: No Transportation Needs (05/02/2023)  Utilities: Not At Risk (05/01/2023)  Depression (PHQ2-9): Low Risk  (09/25/2022)  Financial Resource Strain: Low Risk  (05/02/2023)  Social Connections: Unknown (05/01/2023)  Tobacco Use: Medium Risk (04/29/2023)    Readmission Risk Interventions    03/12/2023    2:19 PM 01/12/2021    1:08 PM 10/20/2020    8:59 AM  Readmission Risk Prevention Plan  Transportation Screening  Complete Complete  PCP or Specialist Appt within 3-5 Days   Complete  HRI or Home Care Consult   Complete  Social Work Consult for Recovery Care Planning/Counseling     Palliative Care Screening   Not Applicable  Medication Review Oceanographer)  Complete Referral to Pharmacy  PCP or Specialist appointment within 3-5 days of discharge  Complete   HRI or  Home Care Consult  Complete   SW Recovery Care/Counseling Consult  Complete   Palliative Care Screening  Not Applicable   Skilled Nursing Facility  Complete      Information is confidential and restricted. Go to Review Flowsheets to unlock data.

## 2023-05-07 NOTE — Plan of Care (Signed)

## 2023-05-07 NOTE — Plan of Care (Signed)
  Problem: Education: Goal: Knowledge of General Education information will improve Description: Including pain rating scale, medication(s)/side effects and non-pharmacologic comfort measures Outcome: Progressing   Problem: Health Behavior/Discharge Planning: Goal: Ability to manage health-related needs will improve Outcome: Progressing   Problem: Clinical Measurements: Goal: Ability to maintain clinical measurements within normal limits will improve Outcome: Progressing Goal: Will remain free from infection Outcome: Progressing Goal: Diagnostic test results will improve Outcome: Progressing Goal: Respiratory complications will improve Outcome: Progressing Goal: Cardiovascular complication will be avoided Outcome: Progressing   Problem: Activity: Goal: Risk for activity intolerance will decrease Outcome: Progressing   Problem: Nutrition: Goal: Adequate nutrition will be maintained Outcome: Progressing   Problem: Coping: Goal: Level of anxiety will decrease Outcome: Progressing   Problem: Elimination: Goal: Will not experience complications related to bowel motility Outcome: Progressing Goal: Will not experience complications related to urinary retention Outcome: Progressing   Problem: Pain Managment: Goal: General experience of comfort will improve and/or be controlled Outcome: Progressing   Problem: Safety: Goal: Ability to remain free from injury will improve Outcome: Progressing   Problem: Skin Integrity: Goal: Risk for impaired skin integrity will decrease Outcome: Progressing   Problem: Education: Goal: Ability to demonstrate management of disease process will improve Outcome: Progressing Goal: Ability to verbalize understanding of medication therapies will improve Outcome: Progressing Goal: Individualized Educational Video(s) Outcome: Progressing   Problem: Activity: Goal: Capacity to carry out activities will improve Outcome: Progressing    Problem: Cardiac: Goal: Ability to achieve and maintain adequate cardiopulmonary perfusion will improve Outcome: Progressing   Problem: Education: Goal: Knowledge of disease or condition will improve Outcome: Progressing Goal: Knowledge of the prescribed therapeutic regimen will improve Outcome: Progressing Goal: Individualized Educational Video(s) Outcome: Progressing   Problem: Activity: Goal: Ability to tolerate increased activity will improve Outcome: Progressing Goal: Will verbalize the importance of balancing activity with adequate rest periods Outcome: Progressing   Problem: Respiratory: Goal: Ability to maintain a clear airway will improve Outcome: Progressing Goal: Levels of oxygenation will improve Outcome: Progressing Goal: Ability to maintain adequate ventilation will improve Outcome: Progressing   Problem: Activity: Goal: Ability to tolerate increased activity will improve Outcome: Progressing   Problem: Clinical Measurements: Goal: Ability to maintain a body temperature in the normal range will improve Outcome: Progressing   Problem: Respiratory: Goal: Ability to maintain adequate ventilation will improve Outcome: Progressing Goal: Ability to maintain a clear airway will improve Outcome: Progressing   Problem: Education: Goal: Ability to describe self-care measures that may prevent or decrease complications (Diabetes Survival Skills Education) will improve Outcome: Progressing Goal: Individualized Educational Video(s) Outcome: Progressing   Problem: Coping: Goal: Ability to adjust to condition or change in health will improve Outcome: Progressing   Problem: Fluid Volume: Goal: Ability to maintain a balanced intake and output will improve Outcome: Progressing   Problem: Health Behavior/Discharge Planning: Goal: Ability to identify and utilize available resources and services will improve Outcome: Progressing Goal: Ability to manage  health-related needs will improve Outcome: Progressing   Problem: Metabolic: Goal: Ability to maintain appropriate glucose levels will improve Outcome: Progressing   Problem: Nutritional: Goal: Maintenance of adequate nutrition will improve Outcome: Progressing Goal: Progress toward achieving an optimal weight will improve Outcome: Progressing   Problem: Skin Integrity: Goal: Risk for impaired skin integrity will decrease Outcome: Progressing   Problem: Tissue Perfusion: Goal: Adequacy of tissue perfusion will improve Outcome: Progressing

## 2023-05-08 DIAGNOSIS — I480 Paroxysmal atrial fibrillation: Secondary | ICD-10-CM | POA: Diagnosis not present

## 2023-05-08 LAB — RENAL FUNCTION PANEL
Albumin: 2.9 g/dL — ABNORMAL LOW (ref 3.5–5.0)
Anion gap: 10 (ref 5–15)
BUN: 45 mg/dL — ABNORMAL HIGH (ref 8–23)
CO2: 27 mmol/L (ref 22–32)
Calcium: 9.2 mg/dL (ref 8.9–10.3)
Chloride: 94 mmol/L — ABNORMAL LOW (ref 98–111)
Creatinine, Ser: 1.1 mg/dL (ref 0.61–1.24)
GFR, Estimated: >60 mL/min (ref >60)
Glucose, Bld: 173 mg/dL — ABNORMAL HIGH (ref 70–99)
Phosphorus: 2.7 mg/dL (ref 2.5–4.6)
Potassium: 6.2 mmol/L — ABNORMAL HIGH (ref 3.5–5.1)
Sodium: 131 mmol/L — ABNORMAL LOW (ref 135–145)

## 2023-05-08 LAB — GLUCOSE, CAPILLARY
Glucose-Capillary: 124 mg/dL — ABNORMAL HIGH (ref 70–99)
Glucose-Capillary: 160 mg/dL — ABNORMAL HIGH (ref 70–99)
Glucose-Capillary: 221 mg/dL — ABNORMAL HIGH (ref 70–99)
Glucose-Capillary: 216 mg/dL — ABNORMAL HIGH (ref 70–99)

## 2023-05-08 LAB — MAGNESIUM: Magnesium: 2.6 mg/dL — ABNORMAL HIGH (ref 1.7–2.4)

## 2023-05-08 MED ORDER — METOPROLOL TARTRATE 50 MG PO TABS
50.0000 mg | ORAL_TABLET | Freq: Two times a day (BID) | ORAL | Status: DC
  Administered 2023-05-08 – 2023-05-09 (×2): 50 mg via ORAL
  Filled 2023-05-08 (×2): qty 1

## 2023-05-08 MED ORDER — CARVEDILOL 12.5 MG PO TABS
12.5000 mg | ORAL_TABLET | Freq: Two times a day (BID) | ORAL | Status: DC
  Administered 2023-05-08: 12.5 mg via ORAL

## 2023-05-08 MED ORDER — CARVEDILOL 12.5 MG PO TABS: 12.5000 mg | ORAL_TABLET | Freq: Two times a day (BID) | ORAL | Status: DC

## 2023-05-08 MED ORDER — FLEET ENEMA RE ENEM: 1.0000 | ENEMA | Freq: Every day | RECTAL | Status: DC | PRN

## 2023-05-08 MED ORDER — DILTIAZEM HCL ER COATED BEADS 120 MG PO CP24
240.0000 mg | ORAL_CAPSULE | Freq: Every day | ORAL | Status: DC
  Administered 2023-05-09: 240 mg via ORAL
  Filled 2023-05-08: qty 2

## 2023-05-08 NOTE — TOC Progression Note (Addendum)
 Transition of Care [facility]) - Progression Note    Patient Details  Name: Raymond Hays MRN: [id number] Date of Birth: 1945/06/10  [facility] CM/SW Contact  Tomasa JAYSON [name], RN Phone Number: 05/08/2023, 9:46 AM  Clinical Narrative:    Attempt to reach [place] at [place] at  [phone] ext [phone]. No answer. Left a message.  Spoke with Nat at [facility] @ [phone]. She stated updates w for auth could be provided at [phone].Message left at number provided for update.     Expected Discharge Plan: Long Term Acute Care (LTAC) Barriers to Discharge: Continued Medical Work up  Expected Discharge Plan and Services   Discharge Planning Services: Other - See comment (LTC facility)   Living arrangements for the past 2 months: Skilled Nursing Facility ([facility] Term [facility] at [facility])                 DME Arranged: N/A DME Agency: NA       HH Arranged: NA HH Agency: NA         Social Determinants of Health (SDOH) Interventions SDOH Screenings   Food Insecurity: No Food Insecurity (05/01/2023)  Housing: Unknown (05/02/2023)  Transportation Needs: No Transportation Needs (05/02/2023)  Utilities: Not At Risk (05/01/2023)  Depression (PHQ2-9): Low Risk  (09/25/2022)  Financial Resource Strain: Low Risk  (05/02/2023)  Social Connections: Unknown (05/01/2023)  Tobacco Use: Medium Risk (04/29/2023)    Readmission Risk Interventions    03/12/2023    2:19 PM 01/12/2021    1:08 PM 10/20/2020    8:59 AM  Readmission Risk Prevention Plan  Transportation Screening  Complete Complete  PCP or Specialist Appt within 3-5 Days   Complete  HRI or Home Care Consult   Complete  Social Work Consult for Recovery Care Planning/Counseling     Palliative Care Screening   Not Applicable  Medication Review [employer])  Complete Referral to Pharmacy  PCP or Specialist appointment within 3-5 days of discharge  Complete   HRI or Home Care Consult  Complete   SW  Recovery Care/Counseling Consult  Complete   Palliative Care Screening  Not Applicable   Skilled Nursing Facility  Complete      Information is confidential and restricted. Go to Review Flowsheets to unlock data.

## 2023-05-08 NOTE — Progress Notes (Signed)
 PROGRESS NOTE    Raymond Hays   FMW:[id number] DOB: [birth year]  DOA: 04/29/2023 Date of Service: 05/08/23 which is hospital day 9  PCP: Isadora Delon BRAVO, MD    Hospital course / significant events:   HPI: 78 year old man past medical history of COPD on chronic 2 L of oxygen, hypertension, hyperlipidemia, diastolic congestive heart failure, suprapubic Foley catheter, neurogenic bladder, atrial fibrillation on Eliquis , spinal stenosis, wheelchair-bound, Parkinson's disease, hypertension, diet-controlled diabetes, hypothyroidism, gout, depression, anxiety, BPH, anemia who presented to ED from SNF/LTC with altered mental status and shortness of breath.    01/28: to ED, He was placed on BiPAP given Lasix  and antibiotics and steroids.  Surgical consultation for distended abdomen likely secondary to constipation and/or ileus. Admitted to hospitalist for repsiratoryfailure, CHF, COPD, PNA, sepsis, ileus, Afib RVR, AMS.  01/29.  Patient had altered mental status this morning MRI of the brain was done which showed no acute intracranial abnormality.  Patient off BiPAP and on oxygen upon reevaluation.  01/30.  Abdomen still distended, no abdominal pain.  Surgeon did a rectal exam in the evening and disimpacted and had a lot of gas that came out.  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 01/31.  Abdomen less distended today.  Patient did have a bowel movement this morning.  Continue enemas in the evening will give a suppository.  Continue lactulose  and MiraLAX . 02/01.  Patient had multiple bowel movements.  Diet advanced to solid food. 02/02.  Patient asking when he can go back to his facility.  States he always has a wheeze 02/03.  Patient asked me to call his sister.  Awaiting approval to go back to his facility.  Added amiodarone  for increased heart rate 02/04.  Heart rate increased again today.  Placed on amiodarone  drip this afternoon since we did not hear back about insurance authorization. 02/05: on amio gtt  this morning. Started on po beta blocker goal to get off amio drip - pt would like discharge asap.      Consultants:  General surgery  Neurology   Procedures/Surgeries: none      ASSESSMENT & PLAN:   Paroxysmal atrial fibrillation with rapid ventricular response  Patient very upset about still being in the hospital and getting himself worked up.  Likely part of the reason why his heart rate is up. Cardizem  dose lowered given soft BP, also if able to come off this / reduce dose given CHF in favor of beta blocker  Added carvedilol  as beta blocker yesterday, converted to metoprolol  today given lower BP   Goal rate control off amiodarone  gtt   Acute on chronic respiratory failure with hypoxia  Came in with respiratory distress and tachypnea placed on BiPAP.   Chronically wears 2 L of oxygen.  Now on room air.   Supplemental O2 as needed    COPD exacerbation improved/resovlved Completed Solu-Medrol  continue inhalers.  Nebulizer switched over to Xopenex  with fast heart rate.  Restarted prednisone  at a lower dose and will taper.  Lungs are clear when he breathes through his nose and sits up. 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   Abdomen less distended than admission.  Transition to full liquids per patient/family request, ok for solid food if he decides he wants this. Pt states feels better on liquid/bland diet  Maalox/Mylanta x1 yesterday, can repeat as needed Added PPI  Continue MiraLAX  to twice daily.   Continue lactulose  daily.   Fleet enema prn  Surgical team does not think he would  survive a surgical procedure.   Severe sepsis present on admission d/t Multifocal pneumonia Bilateral pneumonia seen on CT scan.   Completed antibiotics Reassess as needed   Chronic diastolic CHF (congestive heart failure) stable Essential HTN  Appears euvolemic.  Last EF 55%.   Continue meds: statin, beta blocker, lasix ,  Diltiazem  not ideal but given  issues w/ rate control will leave on for now and taper this while titrating beta blocker  Ideally would be on ARB but will hold for now while titrating beta blocker and cardizem     Acute metabolic encephalopathy likely d/t sepsis - resolved  MRI of the brain negative for stroke.   Mental status much improved from admission. Reassess as needed    Hypothyroidism Continue levothyroxine    Parkinson disease Continue Sinemet    HLD (hyperlipidemia) Continue Lipitor   Depression with anxiety Continue home medication   Advanced care planning  Code Status: FULL CODE - discussed w/ patient 05/07/23 re: resuscitation and life support, likelihood of survival on life support even if cardiac arrest w/ ROSC which is low given his multiple comorbids. He reiterates he'd like to be FULL CODE and requests CPR/intubation if needed however he would not want to be on life support more than 2-3 days if he is not getting better / weaning off in that time.  ACP documentation reviewed: MOST on file in [facility] was VOIDED d/t update as above   Class 1 obesity based on BMI: Body mass index is 32.49 kg/m.  Underweight - under 18  overweight - 25 to 29 obese - 30 or more Class 1 obesity: BMI of 30.0 to 34 Class 2 obesity: BMI of 35.0 to 39 Class 3 obesity: BMI of 40.0 to 49 Super Morbid Obesity: BMI 50-59 Super-super Morbid Obesity: BMI 60+ Significantly low or high BMI is associated with higher medical risk.  Weight management advised as adjunct to other disease management and risk reduction treatments    DVT prophylaxis: eliquis   IV fluids: no continuous IV fluids  Nutrition: full liquids per patient/son request but can have solids if he'd like  Central lines / invasive devices: none  Code Status: FULL CODE - discussed w/ patient 05/07/23 see above ACP documentation reviewed: MOST on file in [facility] was VOIDED d/t update as above   TOC needs: placement / auth  Barriers to dispo / significant pending  items: rate control has improved, plan was for discharge today but we are awaiting auth for SNF             Subjective / Brief ROS:  Patient reports he wants to leave the hospital  Denies chest pain, palpitations Reports feels like he can't eat much d/t reflux problems  Son has concerns about patient's solid diet Pain controlled.  Denies new weakness.  Reports no concerns w/ urination/defecation.   Family Communication: spoke on bedside phone w/ son and daughter-in-law - pt was talking w/ son while I was rounding and I offered to chat w/ family as well     Objective Findings:  Vitals:   05/08/23 0815 05/08/23 1139 05/08/23 1509 05/08/23 1624  BP: 95/78 (!) 89/68    Pulse:  86 74   Resp:   16   Temp: 98.1 F (36.7 C) 98.6 F (37 C)  98.5 F (36.9 C)  TempSrc: Oral Oral  Oral  SpO2:  (!) 88% 96%   Weight:      Height:        Intake/Output Summary (Last 24 hours) at  05/08/2023 1700 Last data filed at 05/08/2023 1134 Gross per 24 hour  Intake 600 ml  Output --  Net 600 ml   Filed Weights   05/06/23 0500 05/07/23 0500 05/08/23 0500  Weight: 95 kg 94.1 kg 90.1 kg    Examination:  Physical Exam Constitutional:      General: He is not in acute distress.    Appearance: He is not ill-appearing.  Cardiovascular:     Rate and Rhythm: Normal rate. Rhythm irregular.  Pulmonary:     Effort: Pulmonary effort is normal.     Breath sounds: Normal breath sounds.  Musculoskeletal:     Right lower leg: No edema.     Left lower leg: No edema.  Neurological:     Mental Status: He is alert and oriented to person, place, and time.  Psychiatric:        Mood and Affect: Mood normal.        Behavior: Behavior normal.          Scheduled Medications:   apixaban   5 mg Oral BID   atorvastatin   20 mg Oral QHS   carbidopa -levodopa   1 tablet Oral TID AC   citalopram   10 mg Oral QHS   [START ON 05/09/2023] diltiazem   240 mg Oral Daily   famotidine   20 mg Oral Daily    feeding supplement  237 mL Oral BID BM   fluticasone  furoate-vilanterol  1 puff Inhalation Daily   And   umeclidinium bromide   1 puff Inhalation Daily   furosemide   40 mg Oral Daily   gabapentin   800 mg Oral QID   insulin  aspart  0-5 Units Subcutaneous QHS   insulin  aspart  0-9 Units Subcutaneous TID WC   insulin  aspart  7 Units Subcutaneous TID WC   insulin  glargine-yfgn  10 Units Subcutaneous Daily   lactulose   30 g Oral Daily   levalbuterol   1.25 mg Nebulization TID   levothyroxine   75 mcg Oral Q0600   metoprolol  tartrate  50 mg Oral BID   oxybutynin   5 mg Oral BID   pantoprazole   40 mg Oral Daily   polyethylene glycol  17 g Oral BID   predniSONE   20 mg Oral Q breakfast   senna-docusate  1 tablet Oral BID    Continuous Infusions:    PRN Medications:  acetaminophen , albuterol , ALPRAZolam , calcium  carbonate, dextromethorphan -guaiFENesin , hydrALAZINE , hydrocortisone  cream, hydrOXYzine , metoprolol  tartrate, ondansetron  (ZOFRAN ) IV, mouth rinse, sodium phosphate, tiZANidine   Antimicrobials from admission:  Anti-infectives (From admission, onward)    Start     Dose/Rate Route Frequency Ordered Stop   05/04/23 1800  ceFEPIme  (MAXIPIME ) 2 g in sodium chloride  0.9 % 100 mL IVPB        2 g 200 mL/hr over 30 Minutes Intravenous Every 8 hours 05/04/23 0959 05/06/23 0846   05/01/23 0300  vancomycin  (VANCOREADY) IVPB 1250 mg/250 mL  Status:  Discontinued        1,250 mg 166.7 mL/hr over 90 Minutes Intravenous Every 24 hours 04/30/23 0238 04/30/23 1145   04/30/23 2300  vancomycin  (VANCOCIN ) IVPB 1000 mg/200 mL premix        1,000 mg 200 mL/hr over 60 Minutes Intravenous Daily 04/30/23 1145 05/04/23 2307   04/30/23 2200  ceFEPIme  (MAXIPIME ) 2 g in sodium chloride  0.9 % 100 mL IVPB  Status:  Discontinued        2 g 200 mL/hr over 30 Minutes Intravenous Every 12 hours 04/30/23 1138 05/04/23 0959   04/30/23 0245  ceFEPIme  (  MAXIPIME ) 2 g in sodium chloride  0.9 % 100 mL IVPB  Status:   Discontinued        2 g 200 mL/hr over 30 Minutes Intravenous Every 8 hours 04/30/23 0232 04/30/23 1138   04/30/23 0230  vancomycin  (VANCOCIN ) IVPB 1000 mg/200 mL premix       Placed in Followed by Linked Group   1,000 mg 200 mL/hr over 60 Minutes Intravenous  Once 04/30/23 0227 04/30/23 0426   04/30/23 0230  vancomycin  (VANCOREADY) IVPB 1500 mg/300 mL       Placed in Followed by Linked Group   1,500 mg 150 mL/hr over 120 Minutes Intravenous  Once 04/30/23 0227 04/30/23 9367   04/29/23 1615  cefTRIAXone  (ROCEPHIN ) 2 g in sodium chloride  0.9 % 100 mL IVPB        2 g 200 mL/hr over 30 Minutes Intravenous Once 04/29/23 1611 04/29/23 1754   04/29/23 1615  azithromycin  (ZITHROMAX ) 500 mg in sodium chloride  0.9 % 250 mL IVPB        500 mg 250 mL/hr over 60 Minutes Intravenous  Once 04/29/23 1611 04/29/23 1942           Data Reviewed:  I have personally reviewed the following...  CBC: Recent Labs  Lab 05/02/23 0540 05/03/23 0455 05/04/23 0432 05/06/23 0738  WBC 12.9* 9.3 10.8* 14.9*  HGB 11.6* 10.6* 11.8* 14.1  HCT 35.3* 32.7* 35.4* 41.6  MCV 89.1 90.6 89.2 86.3  PLT 346 293 290 301   Basic Metabolic Panel: Recent Labs  Lab 05/03/23 0455 05/04/23 0432 05/06/23 0738 05/08/23 0648  NA 133* 132* 133* 131*  K 4.5 4.4 3.8 6.2*  CL 102 98 91* 94*  CO2 22 26 29 27   GLUCOSE 282* 322* 447* 173*  BUN 51* 39* 41* 45*  CREATININE 1.16 0.97 0.99 1.10  CALCIUM  9.0 9.1 8.9 9.2  MG  --   --   --  2.6*  PHOS  --   --   --  2.7   GFR: Estimated Creatinine Clearance: 58.3 mL/min (by C-G formula based on SCr of 1.1 mg/dL). Liver Function Tests: Recent Labs  Lab 05/08/23 0648  ALBUMIN 2.9*   No results for input(s): LIPASE, AMYLASE in the last 168 hours. No results for input(s): AMMONIA in the last 168 hours.  Coagulation Profile: No results for input(s): INR, PROTIME in the last 168 hours.  Cardiac Enzymes: No results for input(s): CKTOTAL, CKMB,  CKMBINDEX, TROPONINI in the last 168 hours. BNP (last 3 results) No results for input(s): PROBNP in the last 8760 hours. HbA1C: Recent Labs    05/06/23 0738  HGBA1C 7.4*   CBG: Recent Labs  Lab 05/07/23 1529 05/07/23 2052 05/08/23 0909 05/08/23 1210 05/08/23 1626  GLUCAP 381* 288* 124* 160* 221*   Lipid Profile: No results for input(s): CHOL, HDL, LDLCALC, TRIG, CHOLHDL, LDLDIRECT in the last 72 hours. Thyroid Function Tests: No results for input(s): TSH, T4TOTAL, FREET4, T3FREE, THYROIDAB in the last 72 hours. Anemia Panel: No results for input(s): VITAMINB12, FOLATE, FERRITIN, TIBC, IRON, RETICCTPCT in the last 72 hours. Most Recent Urinalysis On File:     Component Value Date/Time   COLORURINE YELLOW (A) 09/14/2022 1228   APPEARANCEUR CLOUDY (A) 09/14/2022 1228   LABSPEC 1.017 09/14/2022 1228   PHURINE 8.0 09/14/2022 1228   GLUCOSEU NEGATIVE 09/14/2022 1228   HGBUR SMALL (A) 09/14/2022 1228   BILIRUBINUR NEGATIVE 09/14/2022 1228   KETONESUR NEGATIVE 09/14/2022 1228   PROTEINUR 100 (A) 09/14/2022 1228  NITRITE NEGATIVE 09/14/2022 1228   LEUKOCYTESUR LARGE (A) 09/14/2022 1228   Sepsis Labs: @LABRCNTIP (procalcitonin:4,lacticidven:4) Microbiology: Recent Results (from the past 240 hours)  Resp panel by RT-PCR (RSV, Flu A&B, Covid) Anterior Nasal Swab     Status: None   Collection Time: 04/29/23  3:28 PM   Specimen: Anterior Nasal Swab  Result Value Ref Range Status   SARS Coronavirus 2 by RT PCR NEGATIVE NEGATIVE Final    Comment: (NOTE) SARS-CoV-2 target nucleic acids are NOT DETECTED.  The SARS-CoV-2 RNA is generally detectable in upper respiratory specimens during the acute phase of infection. The lowest concentration of SARS-CoV-2 viral copies this assay can detect is 138 copies/mL. A negative result does not preclude SARS-Cov-2 infection and should not be used as the sole basis for treatment or other patient  management decisions. A negative result may occur with  improper specimen collection/handling, submission of specimen other than nasopharyngeal swab, presence of viral mutation(s) within the areas targeted by this assay, and inadequate number of viral copies(<138 copies/mL). A negative result must be combined with clinical observations, patient history, and epidemiological information. The expected result is Negative.  Fact Sheet for Patients:  bloggercourse.com  Fact Sheet for Healthcare Providers:  seriousbroker.it  This test is no t yet approved or cleared by the [place]  FDA and  has been authorized for detection and/or diagnosis of SARS-CoV-2 by FDA under an Emergency Use Authorization (EUA). This EUA will remain  in effect (meaning this test can be used) for the duration of the COVID-19 declaration under Section 564(b)(1) of the Act, 21 U.S.C.section 360bbb-3(b)(1), unless the authorization is terminated  or revoked sooner.       Influenza A by PCR NEGATIVE NEGATIVE Final   Influenza B by PCR NEGATIVE NEGATIVE Final    Comment: (NOTE) The Xpert Xpress SARS-CoV-2/FLU/RSV plus assay is intended as an aid in the diagnosis of influenza from Nasopharyngeal swab specimens and should not be used as a sole basis for treatment. Nasal washings and aspirates are unacceptable for Xpert Xpress SARS-CoV-2/FLU/RSV testing.  Fact Sheet for Patients: bloggercourse.com  Fact Sheet for Healthcare Providers: seriousbroker.it  This test is not yet approved or cleared by the [place]  FDA and has been authorized for detection and/or diagnosis of SARS-CoV-2 by FDA under an Emergency Use Authorization (EUA). This EUA will remain in effect (meaning this test can be used) for the duration of the COVID-19 declaration under Section 564(b)(1) of the Act, 21 U.S.C. section 360bbb-3(b)(1),  unless the authorization is terminated or revoked.     Resp Syncytial Virus by PCR NEGATIVE NEGATIVE Final    Comment: (NOTE) Fact Sheet for Patients: bloggercourse.com  Fact Sheet for Healthcare Providers: seriousbroker.it  This test is not yet approved or cleared by the [place]  FDA and has been authorized for detection and/or diagnosis of SARS-CoV-2 by FDA under an Emergency Use Authorization (EUA). This EUA will remain in effect (meaning this test can be used) for the duration of the COVID-19 declaration under Section 564(b)(1) of the Act, 21 U.S.C. section 360bbb-3(b)(1), unless the authorization is terminated or revoked.  Performed at [facility], [address] [place], [place] [address]   Culture, blood (routine x 2)     Status: None   Collection Time: 04/29/23  4:38 PM   Specimen: BLOOD  Result Value Ref Range Status   Specimen Description BLOOD BLOOD RIGHT ARM  Final   Special Requests   Final    BOTTLES DRAWN AEROBIC AND ANAEROBIC Blood  Culture adequate volume   Culture   Final    NO GROWTH 5 DAYS Performed at [facility], [address] [place], [place] [address]    Report Status 05/04/2023 FINAL  Final  Culture, blood (routine x 2)     Status: None   Collection Time: 04/29/23  4:38 PM   Specimen: BLOOD  Result Value Ref Range Status   Specimen Description BLOOD BLOOD LEFT ARM  Final   Special Requests   Final    BOTTLES DRAWN AEROBIC AND ANAEROBIC Blood Culture adequate volume   Culture   Final    NO GROWTH 5 DAYS Performed at [facility], [address] [place], [place] [address]    Report Status 05/04/2023 FINAL  Final  Respiratory (~20 pathogens) panel by PCR     Status: None   Collection Time: 05/01/23  9:00 AM   Specimen: Nasopharyngeal Swab; Respiratory  Result Value Ref Range Status   Adenovirus NOT DETECTED NOT DETECTED Final   Coronavirus 229E NOT  DETECTED NOT DETECTED Final    Comment: (NOTE) The Coronavirus on the Respiratory Panel, DOES NOT test for the novel  Coronavirus (2019 nCoV)    Coronavirus HKU1 NOT DETECTED NOT DETECTED Final   Coronavirus NL63 NOT DETECTED NOT DETECTED Final   Coronavirus OC43 NOT DETECTED NOT DETECTED Final   Metapneumovirus NOT DETECTED NOT DETECTED Final   Rhinovirus / Enterovirus NOT DETECTED NOT DETECTED Final   Influenza A NOT DETECTED NOT DETECTED Final   Influenza B NOT DETECTED NOT DETECTED Final   Parainfluenza Virus 1 NOT DETECTED NOT DETECTED Final   Parainfluenza Virus 2 NOT DETECTED NOT DETECTED Final   Parainfluenza Virus 3 NOT DETECTED NOT DETECTED Final   Parainfluenza Virus 4 NOT DETECTED NOT DETECTED Final   Respiratory Syncytial Virus NOT DETECTED NOT DETECTED Final   Bordetella pertussis NOT DETECTED NOT DETECTED Final   Bordetella Parapertussis NOT DETECTED NOT DETECTED Final   Chlamydophila pneumoniae NOT DETECTED NOT DETECTED Final   Mycoplasma pneumoniae NOT DETECTED NOT DETECTED Final    Comment: Performed at [facility], [address], [place], [place] [address]  MRSA Next Gen by PCR, Nasal     Status: Abnormal   Collection Time: 05/01/23  9:00 AM  Result Value Ref Range Status   MRSA by PCR Next Gen DETECTED (A) NOT DETECTED Final    Comment: RESULT CALLED TO, READ BACK BY AND VERIFIED WITH: MANUELITA KA RN 1212 05/01/23 HNM Performed at [facility], [address] [place], [place] [address]       Radiology Studies last 3 days: No results found.     Time spent: 50 min     Yerik Zeringue, DO [facility] 05/08/2023, 5:00 PM    Dictation software may have been used to generate the above note. Typos may occur and escape review in typed/dictated notes. Please contact Dr Marsa directly for clarity if needed.  Staff may message me via secure chat in Epic  but this may not receive an immediate response,  please page me for  urgent matters!  If 7PM-7AM, please contact night coverage [email or web address]

## 2023-05-08 NOTE — Plan of Care (Signed)

## 2023-05-09 DIAGNOSIS — I480 Paroxysmal atrial fibrillation: Secondary | ICD-10-CM | POA: Diagnosis not present

## 2023-05-09 LAB — BASIC METABOLIC PANEL
Anion gap: 9 (ref 5–15)
BUN: 53 mg/dL — ABNORMAL HIGH (ref 8–23)
CO2: 30 mmol/L (ref 22–32)
Calcium: 8.5 mg/dL — ABNORMAL LOW (ref 8.9–10.3)
Chloride: 92 mmol/L — ABNORMAL LOW (ref 98–111)
Creatinine, Ser: 1.17 mg/dL (ref 0.61–1.24)
GFR, Estimated: >60 mL/min (ref >60)
Glucose, Bld: 114 mg/dL — ABNORMAL HIGH (ref 70–99)
Potassium: 5.1 mmol/L (ref 3.5–5.1)
Sodium: 131 mmol/L — ABNORMAL LOW (ref 135–145)

## 2023-05-09 LAB — GLUCOSE, CAPILLARY
Glucose-Capillary: 82 mg/dL (ref 70–99)
Glucose-Capillary: 140 mg/dL — ABNORMAL HIGH (ref 70–99)
Glucose-Capillary: 103 mg/dL — ABNORMAL HIGH (ref 70–99)

## 2023-05-09 MED ORDER — PANTOPRAZOLE SODIUM 40 MG PO TBEC: 40.0000 mg | DELAYED_RELEASE_TABLET | Freq: Every day | ORAL | Status: AC

## 2023-05-09 MED ORDER — DM-GUAIFENESIN ER 30-600 MG PO TB12: 1.0000 | ORAL_TABLET | Freq: Two times a day (BID) | ORAL | Status: AC | PRN

## 2023-05-09 MED ORDER — INSULIN GLARGINE-YFGN 100 UNIT/ML ~~LOC~~ SOLN: 10.0000 [IU] | Freq: Every day | SUBCUTANEOUS | Status: AC

## 2023-05-09 MED ORDER — METOPROLOL TARTRATE 50 MG PO TABS: 50.0000 mg | ORAL_TABLET | Freq: Two times a day (BID) | ORAL | Status: DC

## 2023-05-09 MED ORDER — ENSURE ENLIVE PO LIQD: 237.0000 mL | Freq: Two times a day (BID) | ORAL | Status: AC

## 2023-05-09 MED ORDER — FUROSEMIDE 20 MG PO TABS: 40.0000 mg | ORAL_TABLET | Freq: Every day | ORAL | Status: AC

## 2023-05-09 MED ORDER — FLEET ENEMA RE ENEM: 1.0000 | ENEMA | Freq: Every day | RECTAL | Status: AC | PRN

## 2023-05-09 MED ORDER — ACETAMINOPHEN 325 MG PO TABS: 650.0000 mg | ORAL_TABLET | Freq: Four times a day (QID) | ORAL | Status: AC | PRN

## 2023-05-09 MED ORDER — LACTULOSE 10 GM/15ML PO SOLN: 30.0000 g | Freq: Every day | ORAL | Status: AC

## 2023-05-09 NOTE — TOC Transition Note (Signed)
 Transition of Care [facility]) - Discharge Note   Patient Details  Name: Raymond Hays MRN: [id number] Date of Birth: Aug 01, 1945  Transition of Care [facility]) CM/SW Contact:  Reise Gladney C Jennilee Demarco, RN Phone Number: 05/09/2023, 3:15 PM   Clinical Narrative:    [place] with [facility]  in admissions at [facility] Per facility patient admission confirmed for today. Patient assigned room #313-A  Nurse will call report to [phone] Face sheet and medical necessity forms printed to the floor to be added to the EMS pack EMS arranged He's number three. Discharge summary and SNF transfer report sent in HUB.  Nurse, and patient notified TOC signing off.    Final next level of care: Long Term Nursing Home Barriers to Discharge: Continued Medical Work up   Patient Goals and CMS Choice Patient states their goals for this hospitalization and ongoing recovery are:: To return to [facility], room 313          Discharge Placement                       Discharge Plan and Services Additional resources added to the After Visit Summary for     Discharge Planning Services: Other - See comment (LTC facility)            DME Arranged: N/A DME Agency: NA       HH Arranged: NA HH Agency: NA        Social Drivers of Health (SDOH) Interventions SDOH Screenings   Food Insecurity: No Food Insecurity (05/01/2023)  Housing: Unknown (05/02/2023)  Transportation Needs: No Transportation Needs (05/02/2023)  Utilities: Not At Risk (05/01/2023)  Depression (PHQ2-9): Low Risk  (09/25/2022)  Financial Resource Strain: Low Risk  (05/02/2023)  Social Connections: Unknown (05/01/2023)  Tobacco Use: Medium Risk (04/29/2023)     Readmission Risk Interventions    03/12/2023    2:19 PM 01/12/2021    1:08 PM 10/20/2020    8:59 AM  Readmission Risk Prevention Plan  Transportation Screening  Complete Complete  PCP or Specialist Appt within 3-5 Days   Complete  HRI or Home Care Consult   Complete  Social Work Consult for  Recovery Care Planning/Counseling     Palliative Care Screening   Not Applicable  Medication Review [facility])  Complete Referral to Pharmacy  PCP or Specialist appointment within 3-5 days of discharge  Complete   HRI or Home Care Consult  Complete   SW Recovery Care/Counseling Consult  Complete   Palliative Care Screening  Not Applicable   Skilled Nursing Facility  Complete      Information is confidential and restricted. Go to Review Flowsheets to unlock data.

## 2023-05-09 NOTE — TOC Progression Note (Addendum)
 Transition of Care [facility]) - Progression Note    Patient Details  Name: Raymond Hays MRN: [id number] Date of Birth: 09/05/1945  Transition of Care [facility]) CM/SW Contact  Tomasa JAYSON [name], RN Phone Number: 05/09/2023, 9:02 AM  Clinical Narrative:    Retrieved message left at 8:09am  from Abran Dross at [facility] @ [phone]. Abran stated there has not been a determination for [facility] auth.  9:03am Spoke with Abran. She stated all information has been received and shara is still being reviewed. Decision may be made today. She will contact this RNCM when an update is available.    1:57pm Retrieved call from Asberry Day at [facility]. Patient authorization is approved for INDEFINITE CONTRACT. Contacted Ellouise at [facility] to notify of discharge. MD notified.    Expected Discharge Plan: Long Term Acute Care (LTAC) Barriers to Discharge: Continued Medical Work up  Expected Discharge Plan and Services   Discharge Planning Services: Other - See comment (LTC facility)   Living arrangements for the past 2 months: Skilled Nursing Facility ([facility] at [facility])                 DME Arranged: N/A DME Agency: NA       HH Arranged: NA HH Agency: NA         Social Determinants of Health (SDOH) Interventions SDOH Screenings   Food Insecurity: No Food Insecurity (05/01/2023)  Housing: Unknown (05/02/2023)  Transportation Needs: No Transportation Needs (05/02/2023)  Utilities: Not At Risk (05/01/2023)  Depression (PHQ2-9): Low Risk  (09/25/2022)  Financial Resource Strain: Low Risk  (05/02/2023)  Social Connections: Unknown (05/01/2023)  Tobacco Use: Medium Risk (04/29/2023)    Readmission Risk Interventions    03/12/2023    2:19 PM 01/12/2021    1:08 PM 10/20/2020    8:59 AM  Readmission Risk Prevention Plan  Transportation Screening  Complete Complete  PCP or Specialist Appt within 3-5 Days   Complete  HRI or Home Care Consult   Complete  Social Work Consult for  Recovery Care Planning/Counseling     Palliative Care Screening   Not Applicable  Medication Review [employer])  Complete Referral to Pharmacy  PCP or Specialist appointment within 3-5 days of discharge  Complete   HRI or Home Care Consult  Complete   SW Recovery Care/Counseling Consult  Complete   Palliative Care Screening  Not Applicable   Skilled Nursing Facility  Complete      Information is confidential and restricted. Go to Review Flowsheets to unlock data.

## 2023-05-09 NOTE — Discharge Summary (Signed)
 Physician Discharge Summary   Patient: Raymond Hays MRN: [id number]  DOB: 1945/06/05   Admit:     Date of Admission: 04/29/2023 Admitted from: SNF long term care    Discharge: Date of discharge: 05/09/23 Disposition: Skilled nursing facility Condition at discharge: good  CODE STATUS: FULL CODE      Discharge Physician: Laneta Blunt, DO [facility]     PCP: Isadora Delon BRAVO, MD  Recommendations for Outpatient Follow-up:  Follow up with PCP Isadora Delon BRAVO, MD in 1-2 weeks Please obtain labs/tests: BMP, CBC in 1-2 weeks   Discharge Instructions     Diet - low sodium heart healthy   Complete by: As directed    Diet Carb Modified   Complete by: As directed    Diet general   Complete by: As directed    NOTE patient reports improvement in abdominal bloating with liquid diet - he may advance as tolerated   Increase activity slowly   Complete by: As directed          Discharge Diagnoses: Principal Problem:   Paroxysmal atrial fibrillation with rapid ventricular response (HCC) Active Problems:   COPD exacerbation (HCC)   Acute on chronic respiratory failure with hypoxia (HCC)   Abdominal distension   Severe sepsis (HCC)   Multifocal pneumonia   Acute on chronic diastolic CHF (congestive heart failure) (HCC)   Acute metabolic encephalopathy   Essential hypertension   Parkinson disease (HCC)   Hypothyroidism   HLD (hyperlipidemia)   Depression with anxiety   Obesity (BMI 30-39.9)   Advance care planning       Hospital course / significant events:   HPI: 78 year old man past medical history of COPD on chronic 2 L of oxygen, hypertension, hyperlipidemia, diastolic congestive heart failure, suprapubic Foley catheter, neurogenic bladder, atrial fibrillation on Eliquis , spinal stenosis, wheelchair-bound, Parkinson's disease, hypertension, diet-controlled diabetes, hypothyroidism, gout, depression, anxiety, BPH, anemia who presented to ED  from SNF/LTC with altered mental status and shortness of breath.    01/28: to ED, He was placed on BiPAP given Lasix  and antibiotics and steroids.  Surgical consultation for distended abdomen likely secondary to constipation and/or ileus. Admitted to hospitalist for repsiratoryfailure, CHF, COPD, PNA, sepsis, ileus, Afib RVR, AMS.  01/29.  Patient had altered mental status this morning MRI of the brain was done which showed no acute intracranial abnormality.  Patient off BiPAP and on oxygen upon reevaluation.  01/30.  Abdomen still distended, no abdominal pain.  Surgeon did a rectal exam in the evening and disimpacted and had a lot of gas that came out.  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 01/31.  Abdomen less distended today.  Patient did have a bowel movement this morning.  Continue enemas in the evening will give a suppository.  Continue lactulose  and MiraLAX . 02/01.  Patient had multiple bowel movements.  Diet advanced to solid food. 02/02.  Patient asking when he can go back to his facility.  States he always has a wheeze 02/03.  Patient asked me to call his sister.  Awaiting approval to go back to his facility.  Added amiodarone  for increased heart rate 02/04.  Heart rate increased again today.  Placed on amiodarone  drip this afternoon since we did not hear back about insurance authorization. 02/05: on amio gtt this morning. Started on po beta blocker goal to get off amio drip - pt would like discharge asap.  02/06: titration cardizem /beta blocker but remains rate controlled, stable for discharge  02/07 augh approved for  discharge today      Consultants:  General surgery  Neurology   Procedures/Surgeries: none      ASSESSMENT & PLAN:   Paroxysmal atrial fibrillation with rapid ventricular response  Patient very upset about still being in the hospital and getting himself worked up.  Likely part of the reason why his heart rate is up. Cardizem  dose lowered given soft BP, in favor of  beta blocker given CHF Continue metoprolol     Acute on chronic respiratory failure with hypoxia  Came in with respiratory distress and tachypnea placed on BiPAP.   Chronically wears 2 L of oxygen.  Now on room air.   Supplemental O2 as needed    COPD exacerbation improved/resovlved Completed Solu-Medrol  continue inhalers.  Nebulizer switched over to Xopenex  with fast heart rate.  Restarted prednisone  at a lower dose and will taper.  Lungs are clear when he breathes through his nose and sits up. 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   Abdomen less distended than admission.  Transition to full liquids per patient/family request, ok for solid food if he decides he wants this. Pt states feels better on liquid/bland diet  Added PPI  Bowel regimen as ordered Surgical team does not think he would survive a surgical procedure.   Severe sepsis present on admission d/t Multifocal pneumonia Bilateral pneumonia seen on CT scan.   Completed antibiotics Reassess as needed   Chronic diastolic CHF (congestive heart failure) stable Essential HTN  Appears euvolemic.  Last EF 55%.   Continue meds: statin, beta blocker, lasix ,  Diltiazem  not ideal but given issues w/ rate control will leave on for now and taper this while titrating beta blocker - continue outpatient  Ideally would be on ARB but will hold for now while titrating beta blocker and cardizem     Acute metabolic encephalopathy likely d/t sepsis - resolved  MRI of the brain negative for stroke.   Mental status much improved from admission. Reassess as needed    Hypothyroidism Continue levothyroxine    Parkinson disease Continue Sinemet    HLD (hyperlipidemia) Continue Lipitor   Depression with anxiety Continue home medication   Advanced care planning  Code Status: FULL CODE - discussed w/ patient 05/07/23 re: resuscitation and life support, likelihood of survival on life support even if cardiac  arrest w/ ROSC which is low given his multiple comorbids. He reiterates he'd like to be FULL CODE and requests CPR/intubation if needed however he would not want to be on life support more than 2-3 days if he is not getting better / weaning off in that time.  ACP documentation reviewed: MOST on file in [facility] was VOIDED d/t update as above   Class 1 obesity based on BMI: Body mass index is 32.49 kg/m.  Underweight - under 18  overweight - 25 to 29 obese - 30 or more Class 1 obesity: BMI of 30.0 to 34 Class 2 obesity: BMI of 35.0 to 39 Class 3 obesity: BMI of 40.0 to 49 Super Morbid Obesity: BMI 50-59 Super-super Morbid Obesity: BMI 60+ Significantly low or high BMI is associated with higher medical risk.  Weight management advised as adjunct to other disease management and risk reduction treatments            Discharge Instructions  Allergies as of 05/09/2023   No Known Allergies      Medication List     STOP taking these medications    acetic acid  0.25 % irrigation  doxycycline  100 MG capsule Commonly known as: VIBRAMYCIN    guaifenesin  100 MG/5ML syrup Commonly known as: ROBITUSSIN   Mucinex  600 MG 12 hr tablet Generic drug: guaiFENesin    nystatin 100000 UNIT/ML suspension Commonly known as: MYCOSTATIN   predniSONE  20 MG tablet Commonly known as: DELTASONE        TAKE these medications    acetaminophen  325 MG tablet Commonly known as: TYLENOL  Take 2 tablets (650 mg total) by mouth every 6 (six) hours as needed for mild pain (pain score 1-3), fever or moderate pain (pain score 4-6).   Acidophilus Probiotic Blend Caps Take 1 capsule by mouth daily.   Albuterol  Sulfate 108 (90 Base) MCG/ACT Aepb Commonly known as: PROAIR  RESPICLICK Inhale 2 puffs into the lungs every 4 (four) hours as needed (COPD).   allopurinol  100 MG tablet Commonly known as: ZYLOPRIM  Take 200 mg by mouth daily.   apixaban  5 MG Tabs tablet Commonly known as: ELIQUIS  Take 5 mg  by mouth 2 (two) times daily.   ascorbic acid  500 MG tablet Commonly known as: VITAMIN C  Take 500 mg by mouth daily.   atorvastatin  20 MG tablet Commonly known as: LIPITOR Take 20 mg by mouth at bedtime.   Biofreeze 4 % Gel Generic drug: Menthol  (Topical Analgesic) Apply 1 application  topically 3 (three) times daily. (Apply to neck)   bisacodyl  10 MG suppository Commonly known as: DULCOLAX Place 10 mg rectally every 3 (three) days. (HOLD if having loose stools)   calcium  carbonate 500 MG chewable tablet Commonly known as: TUMS - dosed in mg elemental calcium  Chew 2 tablets by mouth daily as needed for indigestion or heartburn.   carbidopa -levodopa  25-100 MG tablet Commonly known as: SINEMET  IR Take 1 tablet by mouth 3 (three) times daily before meals.   cetaphil cream Apply 1 Application topically 2 (two) times daily as needed.   cetirizine 5 MG tablet Commonly known as: ZYRTEC Take 5 mg by mouth at bedtime.   citalopram  10 MG tablet Commonly known as: CELEXA  Take 10 mg by mouth at bedtime.   dextromethorphan -guaiFENesin  30-600 MG 12hr tablet Commonly known as: MUCINEX  DM Take 1 tablet by mouth 2 (two) times daily as needed for cough.   diltiazem  240 MG 24 hr capsule Commonly known as: CARDIZEM  CD Take 1 capsule (240 mg total) by mouth daily.   docusate sodium  100 MG capsule Commonly known as: COLACE Take 100 mg by mouth daily.   famotidine  20 MG tablet Commonly known as: PEPCID  Take 20 mg by mouth at bedtime.   feeding supplement Liqd Take 237 mLs by mouth 2 (two) times daily between meals. Start taking on: May 10, 2023   ferrous sulfate  324 (65 Fe) MG Tbec Take 1 tablet by mouth daily.   fluticasone  50 MCG/ACT nasal spray Commonly known as: FLONASE  Place 2 sprays into both nostrils daily.   Fluticasone -Umeclidin-Vilant 100-62.5-25 MCG/ACT Aepb Inhale 1 puff into the lungs daily.   furosemide  20 MG tablet Commonly known as: LASIX  Take 2  tablets (40 mg total) by mouth daily. What changed:  how much to take when to take this reasons to take this   gabapentin  800 MG tablet Commonly known as: NEURONTIN  Take 800 mg by mouth 4 (four) times daily.   hydrocortisone  25 MG suppository Commonly known as: ANUSOL -HC Place 25 mg rectally every 4 (four) hours as needed for hemorrhoids or anal itching.   hydrOXYzine  25 MG tablet Commonly known as: ATARAX  Take 25 mg by mouth every 6 (six) hours  as needed for itching.   insulin  glargine-yfgn 100 UNIT/ML injection Commonly known as: SEMGLEE  Inject 0.1 mLs (10 Units total) into the skin daily. Start taking on: May 10, 2023   ipratropium 0.06 % nasal spray Commonly known as: ATROVENT  Place 2 sprays into both nostrils 3 (three) times daily.   ipratropium-albuterol  0.5-2.5 (3) MG/3ML Soln Commonly known as: DUONEB Take 3 mLs by nebulization every 6 (six) hours.   lactase 3000 units tablet Commonly known as: LACTAID Take 3,000 Units by mouth 3 (three) times daily with meals.   lactulose  10 GM/15ML solution Commonly known as: CHRONULAC  Take 45 mLs (30 g total) by mouth daily. Start taking on: May 10, 2023   levothyroxine  75 MCG tablet Commonly known as: SYNTHROID  Take 75 mcg by mouth daily before breakfast.   lidocaine  4 % Place 1 patch onto the skin daily as needed (pain). (Remove after 12 hours)   melatonin 5 MG Tabs Take 10 mg by mouth at bedtime.   Metamucil Smooth Texture 58.6 % powder Generic drug: psyllium Take 1 packet by mouth 3 (three) times daily.   Metamucil 4 in 1 Fiber 51.7 % Pack Generic drug: Psyllium Take 1 packet by mouth 3 (three) times daily.   methocarbamol  500 MG tablet Commonly known as: ROBAXIN  Take 1,000 mg by mouth every 12 (twelve) hours. At 1400   metoprolol  tartrate 50 MG tablet Commonly known as: LOPRESSOR  Take 1 tablet (50 mg total) by mouth 2 (two) times daily.   nortriptyline  10 MG capsule Commonly known as:  PAMELOR  Take 20 mg by mouth at bedtime.   OcuSoft Lid Scrub Original Pads Place 1 Application into both eyes daily. Wipe both eyelids once per day.   OcuSoft Lid Scrub Original Pads Apply 1 Pad topically every 12 (twelve) hours as needed.   oxybutynin  5 MG tablet Commonly known as: DITROPAN  Take 5 mg by mouth 2 (two) times daily.   pantoprazole  40 MG tablet Commonly known as: PROTONIX  Take 1 tablet (40 mg total) by mouth daily. Start taking on: May 10, 2023   polyethylene glycol 17 g packet Commonly known as: MIRALAX  / GLYCOLAX  Take 17 g by mouth daily.   pramoxine 1 % Lotn Commonly known as: SARNA SENSITIVE Apply 1 Application topically 4 (four) times daily as needed.   Propylene Glycol 0.6 % Soln Place 2 drops into both eyes in the morning, at noon, and at bedtime.   selenium sulfide 1 % Lotn Commonly known as: SELSUN Apply 1 Application topically 2 (two) times a week. (Tuesday and Friday)   senna 8.6 MG Tabs tablet Commonly known as: SENOKOT Take 1 tablet (8.6 mg total) by mouth daily as needed for mild constipation.   silver  sulfADIAZINE  1 % cream Commonly known as: SILVADENE  Apply 1 Application topically daily. (Apply to arms and face)   sodium chloride  0.65 % Soln nasal spray Commonly known as: OCEAN Place 2 sprays into both nostrils every 6 (six) hours as needed for congestion.   sodium phosphate Enem Place 133 mLs (1 enema total) rectally daily as needed for severe constipation.   tacrolimus  0.03 % ointment Commonly known as: PROTOPIC  Apply 1 application  topically daily. (Apply to eyelids)   terbinafine  1 % cream Commonly known as: LAMISIL  Apply 1 Application topically at bedtime. (Apply to feet)   tiZANidine  2 MG tablet Commonly known as: ZANAFLEX  Take 2 mg by mouth 2 (two) times daily as needed for muscle spasms.   triamcinolone  cream 0.1 % Commonly known as: KENALOG   Apply 1 Application topically daily as needed. Apply to arms, legs, and  back r/t rash   Vitamin D3 50 MCG (2000 UT) Tabs Take 1 tablet by mouth daily.          No Known Allergies   Subjective: pt reports brathing is good today, no chest pain or shortness of breath, no abd pain, persistent but stable abdominal bloating   Discharge Exam: BP 112/74 (BP Location: Right Arm)   Pulse 76   Temp 98.1 F (36.7 C) (Oral)   Resp 19   Ht 5' 7 (1.702 m)   Wt 91.3 kg   SpO2 97%   BMI 31.52 kg/m  General: Pt is alert, awake, not in acute distress Cardiovascular: reg rate / rhythm  Respiratory: coarse breath sounds bilaterally  Abdominal: Soft, NT, ND, bowel sounds +hyperactive  Extremities: no edema     The results of significant diagnostics from this hospitalization (including imaging, microbiology, ancillary and laboratory) are listed below for reference.     Microbiology: Recent Results (from the past 240 hours)  Resp panel by RT-PCR (RSV, Flu A&B, Covid) Anterior Nasal Swab     Status: None   Collection Time: 04/29/23  3:28 PM   Specimen: Anterior Nasal Swab  Result Value Ref Range Status   SARS Coronavirus 2 by RT PCR NEGATIVE NEGATIVE Final    Comment: (NOTE) SARS-CoV-2 target nucleic acids are NOT DETECTED.  The SARS-CoV-2 RNA is generally detectable in upper respiratory specimens during the acute phase of infection. The lowest concentration of SARS-CoV-2 viral copies this assay can detect is 138 copies/mL. A negative result does not preclude SARS-Cov-2 infection and should not be used as the sole basis for treatment or other patient management decisions. A negative result may occur with  improper specimen collection/handling, submission of specimen other than nasopharyngeal swab, presence of viral mutation(s) within the areas targeted by this assay, and inadequate number of viral copies(<138 copies/mL). A negative result must be combined with clinical observations, patient history, and epidemiological information. The expected  result is Negative.  Fact Sheet for Patients:  bloggercourse.com  Fact Sheet for Healthcare Providers:  seriousbroker.it  This test is no t yet approved or cleared by the [place]  FDA and  has been authorized for detection and/or diagnosis of SARS-CoV-2 by FDA under an Emergency Use Authorization (EUA). This EUA will remain  in effect (meaning this test can be used) for the duration of the COVID-19 declaration under Section 564(b)(1) of the Act, 21 U.S.C.section 360bbb-3(b)(1), unless the authorization is terminated  or revoked sooner.       Influenza A by PCR NEGATIVE NEGATIVE Final   Influenza B by PCR NEGATIVE NEGATIVE Final    Comment: (NOTE) The Xpert Xpress SARS-CoV-2/FLU/RSV plus assay is intended as an aid in the diagnosis of influenza from Nasopharyngeal swab specimens and should not be used as a sole basis for treatment. Nasal washings and aspirates are unacceptable for Xpert Xpress SARS-CoV-2/FLU/RSV testing.  Fact Sheet for Patients: bloggercourse.com  Fact Sheet for Healthcare Providers: seriousbroker.it  This test is not yet approved or cleared by the [place]  FDA and has been authorized for detection and/or diagnosis of SARS-CoV-2 by FDA under an Emergency Use Authorization (EUA). This EUA will remain in effect (meaning this test can be used) for the duration of the COVID-19 declaration under Section 564(b)(1) of the Act, 21 U.S.C. section 360bbb-3(b)(1), unless the authorization is terminated or revoked.     Resp Syncytial Virus by PCR NEGATIVE  NEGATIVE Final    Comment: (NOTE) Fact Sheet for Patients: bloggercourse.com  Fact Sheet for Healthcare Providers: seriousbroker.it  This test is not yet approved or cleared by the [place]  FDA and has been authorized for detection and/or diagnosis of  SARS-CoV-2 by FDA under an Emergency Use Authorization (EUA). This EUA will remain in effect (meaning this test can be used) for the duration of the COVID-19 declaration under Section 564(b)(1) of the Act, 21 U.S.C. section 360bbb-3(b)(1), unless the authorization is terminated or revoked.  Performed at [facility], [address] [place], [place] [address]   Culture, blood (routine x 2)     Status: None   Collection Time: 04/29/23  4:38 PM   Specimen: BLOOD  Result Value Ref Range Status   Specimen Description BLOOD BLOOD RIGHT ARM  Final   Special Requests   Final    BOTTLES DRAWN AEROBIC AND ANAEROBIC Blood Culture adequate volume   Culture   Final    NO GROWTH 5 DAYS Performed at [facility], [address] [place], [place] [address]    Report Status 05/04/2023 FINAL  Final  Culture, blood (routine x 2)     Status: None   Collection Time: 04/29/23  4:38 PM   Specimen: BLOOD  Result Value Ref Range Status   Specimen Description BLOOD BLOOD LEFT ARM  Final   Special Requests   Final    BOTTLES DRAWN AEROBIC AND ANAEROBIC Blood Culture adequate volume   Culture   Final    NO GROWTH 5 DAYS Performed at [facility], [address] [place], [place] [address]    Report Status 05/04/2023 FINAL  Final  Respiratory (~20 pathogens) panel by PCR     Status: None   Collection Time: 05/01/23  9:00 AM   Specimen: Nasopharyngeal Swab; Respiratory  Result Value Ref Range Status   Adenovirus NOT DETECTED NOT DETECTED Final   Coronavirus 229E NOT DETECTED NOT DETECTED Final    Comment: (NOTE) The Coronavirus on the Respiratory Panel, DOES NOT test for the novel  Coronavirus (2019 nCoV)    Coronavirus HKU1 NOT DETECTED NOT DETECTED Final   Coronavirus NL63 NOT DETECTED NOT DETECTED Final   Coronavirus OC43 NOT DETECTED NOT DETECTED Final   Metapneumovirus NOT DETECTED NOT DETECTED Final   Rhinovirus / Enterovirus NOT DETECTED NOT DETECTED Final    Influenza A NOT DETECTED NOT DETECTED Final   Influenza B NOT DETECTED NOT DETECTED Final   Parainfluenza Virus 1 NOT DETECTED NOT DETECTED Final   Parainfluenza Virus 2 NOT DETECTED NOT DETECTED Final   Parainfluenza Virus 3 NOT DETECTED NOT DETECTED Final   Parainfluenza Virus 4 NOT DETECTED NOT DETECTED Final   Respiratory Syncytial Virus NOT DETECTED NOT DETECTED Final   Bordetella pertussis NOT DETECTED NOT DETECTED Final   Bordetella Parapertussis NOT DETECTED NOT DETECTED Final   Chlamydophila pneumoniae NOT DETECTED NOT DETECTED Final   Mycoplasma pneumoniae NOT DETECTED NOT DETECTED Final    Comment: Performed at [facility], [address] [place], [place] [address]  MRSA Next Gen by PCR, Nasal     Status: Abnormal   Collection Time: 05/01/23  9:00 AM  Result Value Ref Range Status   MRSA by PCR Next Gen DETECTED (A) NOT DETECTED Final    Comment: RESULT CALLED TO, READ BACK BY AND VERIFIED WITH: MANUELITA KA RN 1212 05/01/23 HNM Performed at [facility], [address], [place], [place] [address]  Labs: BNP (last 3 results) Recent Labs    03/10/23 1841 03/13/23 2204 04/29/23 1528  BNP 167.1* 151.3* 152.3*   Basic Metabolic Panel: Recent Labs  Lab 05/03/23 0455 05/04/23 0432 05/06/23 0738 05/08/23 0648 05/09/23 1102  NA 133* 132* 133* 131* 131*  K 4.5 4.4 3.8 6.2* 5.1  CL 102 98 91* 94* 92*  CO2 22 26 29 27 30   GLUCOSE 282* 322* 447* 173* 114*  BUN 51* 39* 41* 45* 53*  CREATININE 1.16 0.97 0.99 1.10 1.17  CALCIUM  9.0 9.1 8.9 9.2 8.5*  MG  --   --   --  2.6*  --   PHOS  --   --   --  2.7  --    Liver Function Tests: Recent Labs  Lab 05/08/23 0648  ALBUMIN 2.9*   No results for input(s): LIPASE, AMYLASE in the last 168 hours. No results for input(s): AMMONIA in the last 168 hours. CBC: Recent Labs  Lab 05/03/23 0455 05/04/23 0432 05/06/23 0738  WBC 9.3 10.8* 14.9*  HGB 10.6* 11.8* 14.1  HCT 32.7* 35.4* 41.6  MCV  90.6 89.2 86.3  PLT 293 290 301   Cardiac Enzymes: No results for input(s): CKTOTAL, CKMB, CKMBINDEX, TROPONINI in the last 168 hours. BNP: Invalid input(s): POCBNP CBG: Recent Labs  Lab 05/08/23 1210 05/08/23 1626 05/08/23 2141 05/09/23 0814 05/09/23 1134  GLUCAP 160* 221* 216* 82 140*   D-Dimer No results for input(s): DDIMER in the last 72 hours. Hgb A1c No results for input(s): HGBA1C in the last 72 hours. Lipid Profile No results for input(s): CHOL, HDL, LDLCALC, TRIG, CHOLHDL, LDLDIRECT in the last 72 hours. Thyroid function studies No results for input(s): TSH, T4TOTAL, T3FREE, THYROIDAB in the last 72 hours.  Invalid input(s): FREET3 Anemia work up No results for input(s): VITAMINB12, FOLATE, FERRITIN, TIBC, IRON, RETICCTPCT in the last 72 hours. Urinalysis    Component Value Date/Time   COLORURINE YELLOW (A) 09/14/2022 1228   APPEARANCEUR CLOUDY (A) 09/14/2022 1228   LABSPEC 1.017 09/14/2022 1228   PHURINE 8.0 09/14/2022 1228   GLUCOSEU NEGATIVE 09/14/2022 1228   HGBUR SMALL (A) 09/14/2022 1228   BILIRUBINUR NEGATIVE 09/14/2022 1228   KETONESUR NEGATIVE 09/14/2022 1228   PROTEINUR 100 (A) 09/14/2022 1228   NITRITE NEGATIVE 09/14/2022 1228   LEUKOCYTESUR LARGE (A) 09/14/2022 1228   Sepsis Labs Recent Labs  Lab 05/03/23 0455 05/04/23 0432 05/06/23 0738  WBC 9.3 10.8* 14.9*   Microbiology Recent Results (from the past 240 hours)  Resp panel by RT-PCR (RSV, Flu A&B, Covid) Anterior Nasal Swab     Status: None   Collection Time: 04/29/23  3:28 PM   Specimen: Anterior Nasal Swab  Result Value Ref Range Status   SARS Coronavirus 2 by RT PCR NEGATIVE NEGATIVE Final    Comment: (NOTE) SARS-CoV-2 target nucleic acids are NOT DETECTED.  The SARS-CoV-2 RNA is generally detectable in upper respiratory specimens during the acute phase of infection. The lowest concentration of SARS-CoV-2 viral copies this assay  can detect is 138 copies/mL. A negative result does not preclude SARS-Cov-2 infection and should not be used as the sole basis for treatment or other patient management decisions. A negative result may occur with  improper specimen collection/handling, submission of specimen other than nasopharyngeal swab, presence of viral mutation(s) within the areas targeted by this assay, and inadequate number of viral copies(<138 copies/mL). A negative result must be combined with clinical observations, patient history, and epidemiological information. The expected result is  Negative.  Fact Sheet for Patients:  bloggercourse.com  Fact Sheet for Healthcare Providers:  seriousbroker.it  This test is no t yet approved or cleared by the [place]  FDA and  has been authorized for detection and/or diagnosis of SARS-CoV-2 by FDA under an Emergency Use Authorization (EUA). This EUA will remain  in effect (meaning this test can be used) for the duration of the COVID-19 declaration under Section 564(b)(1) of the Act, 21 U.S.C.section 360bbb-3(b)(1), unless the authorization is terminated  or revoked sooner.       Influenza A by PCR NEGATIVE NEGATIVE Final   Influenza B by PCR NEGATIVE NEGATIVE Final    Comment: (NOTE) The Xpert Xpress SARS-CoV-2/FLU/RSV plus assay is intended as an aid in the diagnosis of influenza from Nasopharyngeal swab specimens and should not be used as a sole basis for treatment. Nasal washings and aspirates are unacceptable for Xpert Xpress SARS-CoV-2/FLU/RSV testing.  Fact Sheet for Patients: bloggercourse.com  Fact Sheet for Healthcare Providers: seriousbroker.it  This test is not yet approved or cleared by the [place]  FDA and has been authorized for detection and/or diagnosis of SARS-CoV-2 by FDA under an Emergency Use Authorization (EUA). This EUA will  remain in effect (meaning this test can be used) for the duration of the COVID-19 declaration under Section 564(b)(1) of the Act, 21 U.S.C. section 360bbb-3(b)(1), unless the authorization is terminated or revoked.     Resp Syncytial Virus by PCR NEGATIVE NEGATIVE Final    Comment: (NOTE) Fact Sheet for Patients: bloggercourse.com  Fact Sheet for Healthcare Providers: seriousbroker.it  This test is not yet approved or cleared by the [place]  FDA and has been authorized for detection and/or diagnosis of SARS-CoV-2 by FDA under an Emergency Use Authorization (EUA). This EUA will remain in effect (meaning this test can be used) for the duration of the COVID-19 declaration under Section 564(b)(1) of the Act, 21 U.S.C. section 360bbb-3(b)(1), unless the authorization is terminated or revoked.  Performed at [facility], [address] [place], [place] [address]   Culture, blood (routine x 2)     Status: None   Collection Time: 04/29/23  4:38 PM   Specimen: BLOOD  Result Value Ref Range Status   Specimen Description BLOOD BLOOD RIGHT ARM  Final   Special Requests   Final    BOTTLES DRAWN AEROBIC AND ANAEROBIC Blood Culture adequate volume   Culture   Final    NO GROWTH 5 DAYS Performed at [facility], [address], [place], [place] [address]    Report Status 05/04/2023 FINAL  Final  Culture, blood (routine x 2)     Status: None   Collection Time: 04/29/23  4:38 PM   Specimen: BLOOD  Result Value Ref Range Status   Specimen Description BLOOD BLOOD LEFT ARM  Final   Special Requests   Final    BOTTLES DRAWN AEROBIC AND ANAEROBIC Blood Culture adequate volume   Culture   Final    NO GROWTH 5 DAYS Performed at [facility], [address] [place], [place] [address]    Report Status 05/04/2023 FINAL  Final  Respiratory (~20 pathogens) panel by PCR     Status: None   Collection Time:  05/01/23  9:00 AM   Specimen: Nasopharyngeal Swab; Respiratory  Result Value Ref Range Status   Adenovirus NOT DETECTED NOT DETECTED Final   Coronavirus 229E NOT DETECTED NOT DETECTED Final    Comment: (NOTE) The Coronavirus on the Respiratory Panel, DOES NOT  test for the novel  Coronavirus (2019 nCoV)    Coronavirus HKU1 NOT DETECTED NOT DETECTED Final   Coronavirus NL63 NOT DETECTED NOT DETECTED Final   Coronavirus OC43 NOT DETECTED NOT DETECTED Final   Metapneumovirus NOT DETECTED NOT DETECTED Final   Rhinovirus / Enterovirus NOT DETECTED NOT DETECTED Final   Influenza A NOT DETECTED NOT DETECTED Final   Influenza B NOT DETECTED NOT DETECTED Final   Parainfluenza Virus 1 NOT DETECTED NOT DETECTED Final   Parainfluenza Virus 2 NOT DETECTED NOT DETECTED Final   Parainfluenza Virus 3 NOT DETECTED NOT DETECTED Final   Parainfluenza Virus 4 NOT DETECTED NOT DETECTED Final   Respiratory Syncytial Virus NOT DETECTED NOT DETECTED Final   Bordetella pertussis NOT DETECTED NOT DETECTED Final   Bordetella Parapertussis NOT DETECTED NOT DETECTED Final   Chlamydophila pneumoniae NOT DETECTED NOT DETECTED Final   Mycoplasma pneumoniae NOT DETECTED NOT DETECTED Final    Comment: Performed at [facility], [address] [place], [place] [address]  MRSA Next Gen by PCR, Nasal     Status: Abnormal   Collection Time: 05/01/23  9:00 AM  Result Value Ref Range Status   MRSA by PCR Next Gen DETECTED (A) NOT DETECTED Final    Comment: RESULT CALLED TO, READ BACK BY AND VERIFIED WITHBETHA MANUELITA KA RN 1212 05/01/23 HNM Performed at [facility], [address] [place], [place] [address]    Imaging MR BRAIN WO CONTRAST Result Date: 04/30/2023 CLINICAL DATA:  Mental status change, unknown cause. EXAM: MRI HEAD WITHOUT CONTRAST TECHNIQUE: Multiplanar, multiecho pulse sequences of the brain and surrounding structures were obtained without intravenous contrast. COMPARISON:  Head CT  04/29/2023 FINDINGS: Brain: There is no evidence of an acute infarct, mass, midline shift, or extra-axial fluid collection. A single chronic microhemorrhage is noted in the right occipital lobe. There is a small chronic left frontal cortical infarct. T2 hyperintensities in the cerebral white matter bilaterally are nonspecific but compatible with mild chronic small vessel ischemic disease. There is a small chronic right cerebellar infarct. There is mild-to-moderate generalized cerebral atrophy. Vascular: Major intracranial vascular flow voids are preserved. Skull and upper cervical spine: No suspicious marrow lesion. Partially visualized extensive postsurgical changes in the cervical spine. 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 Sinuses/Orbits: Unremarkable orbits. Paranasal sinuses and mastoid air cells are clear. Other: None. IMPRESSION: 1. No acute intracranial abnormality. 2. Mild chronic small vessel ischemic disease. 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 3. 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 Electronically Signed   By: Dasie Hamburg M.D.   On: 04/30/2023 10:52   CT ABDOMEN PELVIS W CONTRAST Result Date: 04/30/2023 CLINICAL DATA:  Abdominal distention, delirium. EXAM: CT ABDOMEN AND PELVIS WITH CONTRAST TECHNIQUE: Multidetector CT imaging of the abdomen and pelvis was performed using the standard protocol following bolus administration of intravenous contrast. RADIATION DOSE REDUCTION: This exam was performed according to the departmental dose-optimization program which includes automated exposure control, adjustment of the mA and/or kV according to patient size and/or use of iterative reconstruction technique. CONTRAST:  OMNIPAQUE  IOHEXOL  300 MG/ML  SOLN COMPARISON:  CT abdomen and pelvis 09/29/2022.  FINDINGS: Lower chest: There is right lower lobe airspace consolidation minimal patchy airspace opacity in the left lower lobe and right middle lobe worrisome for multifocal pneumonia. Hepatobiliary: Small gallstone is present. There is no  biliary ductal dilatation. There is a 2.0 cm cyst in the right lobe of the liver, unchanged. Pancreas: Unremarkable. No pancreatic ductal dilatation or surrounding inflammatory changes. Spleen: Normal in size without focal abnormality. Adrenals/Urinary Tract: Bladder is not well evaluated secondary to streak artifact in the pelvis. There are bilateral renal cortical cysts and hypodensities which are too small to characterize measuring up 2 2.1 cm. There is no hydronephrosis or perinephric fluid. Adrenal glands are within normal limits. Suprapubic Foley catheter decompresses the bladder. Stomach/Bowel: The colon is diffusely dilated and predominantly air-filled to the level of the rectum. There is no twisting of mesentery, wall thickening or surrounding inflammation. There is a large amount of stool in the colon, particularly in the rectosigmoid colon. The appendix is not visualized. Additionally, there are dilated small bowel loops in the right lower quadrant measuring up to 4.2 cm. Small hiatal hernia is present. The stomach is nondilated. Proximal small bowel is nondilated. There is no pneumatosis or free air visualized. Vascular/Lymphatic: Aortic atherosclerosis. No enlarged abdominal or pelvic lymph nodes. Reproductive: Prostate is unremarkable. Other: There is subcutaneous edema in the lateral hips. There is no ascites. There is no focal abdominal wall hernia. Musculoskeletal: Severe degenerative changes affect the spine. Bilateral hip arthroplasties are present. IMPRESSION: 1. Diffuse dilatation of the colon to the level of the rectum with a large amount of stool in the rectosigmoid colon. Findings are favored as ileus secondary to constipation. 2. Dilated small bowel loops  in the right lower quadrant measuring up to 4.2 cm. Findings may be related to ileus or partial small bowel obstruction. 3. Cholelithiasis. 4. Bilateral renal cysts and hypodensities which are too small to characterize. No follow-up imaging recommended. 5. Suprapubic Foley catheter decompresses the bladder. 6. Bilateral lower lobe and right middle lobe airspace consolidation worrisome for multifocal pneumonia. 7. Aortic atherosclerosis. Aortic Atherosclerosis (ICD10-I70.0). Electronically Signed   By: Greig Pique M.D.   On: 04/30/2023 00:02   CT HEAD WO CONTRAST ( ) Result Date: 04/29/2023 CLINICAL DATA:  Delirium EXAM: CT HEAD WITHOUT CONTRAST TECHNIQUE: Contiguous axial images were obtained from the base of the skull through the vertex without intravenous contrast. RADIATION DOSE REDUCTION: This exam was performed according to the departmental dose-optimization program which includes automated exposure control, adjustment of the mA and/or kV according to patient size and/or use of iterative reconstruction technique. COMPARISON:  None Available. FINDINGS: Brain: No evidence of acute infarction, hemorrhage, hydrocephalus, extra-axial collection or mass lesion/mass effect. Remote left frontal infarct. Vascular: No hyperdense vessel.  Calcific atherosclerosis. Skull: No acute fracture. Sinuses/Orbits: Clear sinuses.  No acute orbital findings Other: Partially imaged severe craniocervical degenerative change including calcified pannus at the dens. Resulting potentially severe canal stenosis. IMPRESSION: 1. No evidence of acute intracranial abnormality. 2. Remote left frontal infarct. 3. Severe craniocervical degenerative change, partially imaged with potentially severe canal stenosis. An MRI of the cervical spine could better assess the clinically warranted. Electronically Signed   By: Gilmore GORMAN Molt M.D.   On: 04/29/2023 23:56   DG Chest Portable 1 View Result Date: 04/29/2023 CLINICAL DATA:  Shortness of  breath EXAM: PORTABLE CHEST 1 VIEW COMPARISON:  03/16/2023 FINDINGS: Stable cardiomegaly. Aortic atherosclerosis. Low lung volumes. Streaky right basilar opacity. No pleural effusion or pneumothorax. IMPRESSION: Low lung volumes with streaky right basilar opacity, atelectasis versus infiltrate. Electronically Signed   By: Mabel Converse D.O.   On: 04/29/2023 15:36      Time coordinating discharge: over 30 minutes  SIGNED:  Leron Stoffers  DO [facility]

## 2023-05-19 ENCOUNTER — Inpatient Hospital Stay
Admission: RE | Admit: 2023-05-19 | Discharge: 2023-05-19 | Disposition: A | Discharge status: Home / Self Care | Payer: Self-pay | Source: Ambulatory Visit | Attending: Neurosurgery | Admitting: Neurosurgery

## 2023-05-19 ENCOUNTER — Other Ambulatory Visit: Payer: Self-pay

## 2023-05-19 DIAGNOSIS — Z049 Encounter for examination and observation for unspecified reason: Secondary | ICD-10-CM

## 2023-06-06 ENCOUNTER — Emergency Department

## 2023-06-06 ENCOUNTER — Other Ambulatory Visit: Payer: Self-pay

## 2023-06-06 ENCOUNTER — Inpatient Hospital Stay

## 2023-06-06 ENCOUNTER — Inpatient Hospital Stay
Admission: EM | Admit: 2023-06-06 | Discharge: 2023-06-13 | DRG: 698 | Disposition: A | Discharge status: Skilled Nursing Facility | Source: Skilled Nursing Facility | Attending: Hospitalist | Admitting: Hospitalist

## 2023-06-06 DIAGNOSIS — N390 Urinary tract infection, site not specified: Secondary | ICD-10-CM | POA: Diagnosis present

## 2023-06-06 DIAGNOSIS — T83510A Infection and inflammatory reaction due to cystostomy catheter, initial encounter: Principal | ICD-10-CM | POA: Diagnosis present

## 2023-06-06 DIAGNOSIS — Z1624 Resistance to multiple antibiotics: Secondary | ICD-10-CM | POA: Diagnosis present

## 2023-06-06 DIAGNOSIS — T83030A Leakage of cystostomy catheter, initial encounter: Secondary | ICD-10-CM | POA: Diagnosis present

## 2023-06-06 DIAGNOSIS — Z6833 Body mass index (BMI) 33.0-33.9, adult: Secondary | ICD-10-CM | POA: Diagnosis not present

## 2023-06-06 DIAGNOSIS — I5033 Acute on chronic diastolic (congestive) heart failure: Secondary | ICD-10-CM | POA: Diagnosis present

## 2023-06-06 DIAGNOSIS — Z7901 Long term (current) use of anticoagulants: Secondary | ICD-10-CM

## 2023-06-06 DIAGNOSIS — Z96643 Presence of artificial hip joint, bilateral: Secondary | ICD-10-CM | POA: Diagnosis present

## 2023-06-06 DIAGNOSIS — Z9359 Other cystostomy status: Secondary | ICD-10-CM

## 2023-06-06 DIAGNOSIS — Z79890 Hormone replacement therapy (postmenopausal): Secondary | ICD-10-CM

## 2023-06-06 DIAGNOSIS — Y846 Urinary catheterization as the cause of abnormal reaction of the patient, or of later complication, without mention of misadventure at the time of the procedure: Secondary | ICD-10-CM | POA: Diagnosis present

## 2023-06-06 DIAGNOSIS — B965 Pseudomonas (aeruginosa) (mallei) (pseudomallei) as the cause of diseases classified elsewhere: Secondary | ICD-10-CM | POA: Diagnosis present

## 2023-06-06 DIAGNOSIS — G20B1 Parkinson's disease with dyskinesia, without mention of fluctuations: Secondary | ICD-10-CM

## 2023-06-06 DIAGNOSIS — N319 Neuromuscular dysfunction of bladder, unspecified: Secondary | ICD-10-CM | POA: Diagnosis present

## 2023-06-06 DIAGNOSIS — F418 Other specified anxiety disorders: Secondary | ICD-10-CM | POA: Diagnosis present

## 2023-06-06 DIAGNOSIS — Z794 Long term (current) use of insulin: Secondary | ICD-10-CM

## 2023-06-06 DIAGNOSIS — G20A1 Parkinson's disease without dyskinesia, without mention of fluctuations: Secondary | ICD-10-CM | POA: Diagnosis present

## 2023-06-06 DIAGNOSIS — F419 Anxiety disorder, unspecified: Secondary | ICD-10-CM | POA: Diagnosis present

## 2023-06-06 DIAGNOSIS — E669 Obesity, unspecified: Secondary | ICD-10-CM | POA: Diagnosis present

## 2023-06-06 DIAGNOSIS — J449 Chronic obstructive pulmonary disease, unspecified: Secondary | ICD-10-CM | POA: Diagnosis present

## 2023-06-06 DIAGNOSIS — I959 Hypotension, unspecified: Secondary | ICD-10-CM | POA: Diagnosis present

## 2023-06-06 DIAGNOSIS — Z66 Do not resuscitate: Secondary | ICD-10-CM | POA: Diagnosis present

## 2023-06-06 DIAGNOSIS — B961 Klebsiella pneumoniae [K. pneumoniae] as the cause of diseases classified elsewhere: Secondary | ICD-10-CM | POA: Diagnosis present

## 2023-06-06 DIAGNOSIS — J9621 Acute and chronic respiratory failure with hypoxia: Secondary | ICD-10-CM | POA: Diagnosis present

## 2023-06-06 DIAGNOSIS — R0902 Hypoxemia: Secondary | ICD-10-CM

## 2023-06-06 DIAGNOSIS — N400 Enlarged prostate without lower urinary tract symptoms: Secondary | ICD-10-CM | POA: Diagnosis present

## 2023-06-06 DIAGNOSIS — I480 Paroxysmal atrial fibrillation: Secondary | ICD-10-CM | POA: Diagnosis present

## 2023-06-06 DIAGNOSIS — E1142 Type 2 diabetes mellitus with diabetic polyneuropathy: Secondary | ICD-10-CM | POA: Diagnosis present

## 2023-06-06 DIAGNOSIS — Z79899 Other long term (current) drug therapy: Secondary | ICD-10-CM

## 2023-06-06 DIAGNOSIS — G9341 Metabolic encephalopathy: Secondary | ICD-10-CM | POA: Diagnosis present

## 2023-06-06 DIAGNOSIS — E785 Hyperlipidemia, unspecified: Secondary | ICD-10-CM | POA: Diagnosis present

## 2023-06-06 DIAGNOSIS — R0602 Shortness of breath: Secondary | ICD-10-CM | POA: Diagnosis present

## 2023-06-06 DIAGNOSIS — Z1152 Encounter for screening for COVID-19: Secondary | ICD-10-CM | POA: Diagnosis not present

## 2023-06-06 DIAGNOSIS — Z87891 Personal history of nicotine dependence: Secondary | ICD-10-CM

## 2023-06-06 DIAGNOSIS — I110 Hypertensive heart disease with heart failure: Secondary | ICD-10-CM | POA: Diagnosis present

## 2023-06-06 DIAGNOSIS — I10 Essential (primary) hypertension: Secondary | ICD-10-CM | POA: Diagnosis present

## 2023-06-06 DIAGNOSIS — E039 Hypothyroidism, unspecified: Secondary | ICD-10-CM | POA: Diagnosis present

## 2023-06-06 DIAGNOSIS — F32A Depression, unspecified: Secondary | ICD-10-CM | POA: Diagnosis present

## 2023-06-06 DIAGNOSIS — A419 Sepsis, unspecified organism: Secondary | ICD-10-CM | POA: Diagnosis present

## 2023-06-06 DIAGNOSIS — Z993 Dependence on wheelchair: Secondary | ICD-10-CM

## 2023-06-06 DIAGNOSIS — Z9981 Dependence on supplemental oxygen: Secondary | ICD-10-CM

## 2023-06-06 DIAGNOSIS — K5900 Constipation, unspecified: Secondary | ICD-10-CM | POA: Diagnosis present

## 2023-06-06 LAB — COMPREHENSIVE METABOLIC PANEL
ALT: 5 U/L (ref 0–44)
AST: 12 U/L — ABNORMAL LOW (ref 15–41)
Albumin: 2.7 g/dL — ABNORMAL LOW (ref 3.5–5.0)
Alkaline Phosphatase: 85 U/L (ref 38–126)
Anion gap: 9 (ref 5–15)
BUN: 28 mg/dL — ABNORMAL HIGH (ref 8–23)
CO2: 30 mmol/L (ref 22–32)
Calcium: 9 mg/dL (ref 8.9–10.3)
Chloride: 98 mmol/L (ref 98–111)
Creatinine, Ser: 1.23 mg/dL (ref 0.61–1.24)
GFR, Estimated: 60 mL/min — ABNORMAL LOW (ref >60)
Glucose, Bld: 147 mg/dL — ABNORMAL HIGH (ref 70–99)
Potassium: 4.2 mmol/L (ref 3.5–5.1)
Sodium: 137 mmol/L (ref 135–145)
Total Bilirubin: 1.2 mg/dL (ref 0.0–1.2)
Total Protein: 6.6 g/dL (ref 6.5–8.1)

## 2023-06-06 LAB — BLOOD GAS, ARTERIAL
Acid-Base Excess: 11.6 mmol/L — ABNORMAL HIGH (ref 0.0–2.0)
Bicarbonate: 35.9 mmol/L — ABNORMAL HIGH (ref 20.0–28.0)
FIO2: 0.44 %
O2 Content: 6 L/min
O2 Saturation: 97.6 %
Patient temperature: 37
pCO2 arterial: 44 mmHg (ref 32–48)
pH, Arterial: 7.52 — ABNORMAL HIGH (ref 7.35–7.45)
pO2, Arterial: 77 mmHg — ABNORMAL LOW (ref 83–108)

## 2023-06-06 LAB — URINALYSIS, W/ REFLEX TO CULTURE (INFECTION SUSPECTED)
Bilirubin Urine: NEGATIVE
Glucose, UA: NEGATIVE mg/dL
Ketones, ur: NEGATIVE mg/dL
Nitrite: NEGATIVE
Protein, ur: NEGATIVE mg/dL
RBC / HPF: >50 RBC/hpf (ref 0–5)
Specific Gravity, Urine: 1.012 (ref 1.005–1.030)
Squamous Epithelial / HPF: 0 /HPF (ref 0–5)
WBC, UA: >50 WBC/hpf (ref 0–5)
pH: 6 (ref 5.0–8.0)

## 2023-06-06 LAB — CBC WITH DIFFERENTIAL/PLATELET
Abs Immature Granulocytes: 0.28 10*3/uL — ABNORMAL HIGH (ref 0.00–0.07)
Basophils Absolute: 0.1 10*3/uL (ref 0.0–0.1)
Basophils Relative: 0 %
Eosinophils Absolute: 0 10*3/uL (ref 0.0–0.5)
Eosinophils Relative: 0 %
HCT: 37 % — ABNORMAL LOW (ref 39.0–52.0)
Hemoglobin: 12.1 g/dL — ABNORMAL LOW (ref 13.0–17.0)
Immature Granulocytes: 1 %
Lymphocytes Relative: 11 %
Lymphs Abs: 2.6 10*3/uL (ref 0.7–4.0)
MCH: 30.3 pg (ref 26.0–34.0)
MCHC: 32.7 g/dL (ref 30.0–36.0)
MCV: 92.7 fL (ref 80.0–100.0)
Monocytes Absolute: 1.6 10*3/uL — ABNORMAL HIGH (ref 0.1–1.0)
Monocytes Relative: 7 %
Neutro Abs: 19.2 10*3/uL — ABNORMAL HIGH (ref 1.7–7.7)
Neutrophils Relative %: 81 %
Platelets: 275 10*3/uL (ref 150–400)
RBC: 3.99 MIL/uL — ABNORMAL LOW (ref 4.22–5.81)
RDW: 18.7 % — ABNORMAL HIGH (ref 11.5–15.5)
WBC: 23.8 10*3/uL — ABNORMAL HIGH (ref 4.0–10.5)
nRBC: 0 % (ref 0.0–0.2)

## 2023-06-06 LAB — RESP PANEL BY RT-PCR (RSV, FLU A&B, COVID)  RVPGX2
Influenza A by PCR: NEGATIVE
Influenza B by PCR: NEGATIVE
Resp Syncytial Virus by PCR: NEGATIVE
SARS Coronavirus 2 by RT PCR: NEGATIVE

## 2023-06-06 LAB — BRAIN NATRIURETIC PEPTIDE: B Natriuretic Peptide: 320.8 pg/mL — ABNORMAL HIGH (ref 0.0–100.0)

## 2023-06-06 LAB — CBG MONITORING, ED: Glucose-Capillary: 139 mg/dL — ABNORMAL HIGH (ref 70–99)

## 2023-06-06 LAB — LACTIC ACID, PLASMA
Lactic Acid, Venous: 1 mmol/L (ref 0.5–1.9)
Lactic Acid, Venous: 1.4 mmol/L (ref 0.5–1.9)

## 2023-06-06 LAB — TROPONIN I (HIGH SENSITIVITY)
Troponin I (High Sensitivity): 15 ng/L (ref <18)
Troponin I (High Sensitivity): 17 ng/L (ref <18)

## 2023-06-06 MED ORDER — INSULIN GLARGINE-YFGN 100 UNIT/ML ~~LOC~~ SOLN
7.0000 [IU] | Freq: Every day | SUBCUTANEOUS | Status: DC
  Administered 2023-06-07 – 2023-06-13 (×7): 7 [IU] via SUBCUTANEOUS
  Filled 2023-06-06 (×7): qty 0.07

## 2023-06-06 MED ORDER — CITALOPRAM HYDROBROMIDE 20 MG PO TABS
10.0000 mg | ORAL_TABLET | Freq: Every day | ORAL | Status: DC
  Administered 2023-06-07 – 2023-06-12 (×6): 10 mg via ORAL
  Filled 2023-06-06 (×6): qty 1

## 2023-06-06 MED ORDER — FUROSEMIDE 10 MG/ML IJ SOLN
20.0000 mg | Freq: Two times a day (BID) | INTRAMUSCULAR | Status: DC
  Administered 2023-06-06: 20 mg via INTRAVENOUS
  Filled 2023-06-06: qty 4

## 2023-06-06 MED ORDER — OXYBUTYNIN CHLORIDE 5 MG PO TABS
5.0000 mg | ORAL_TABLET | Freq: Two times a day (BID) | ORAL | Status: DC
  Administered 2023-06-07 – 2023-06-13 (×13): 5 mg via ORAL
  Filled 2023-06-06 (×13): qty 1

## 2023-06-06 MED ORDER — INSULIN ASPART 100 UNIT/ML IJ SOLN
0.0000 [IU] | Freq: Three times a day (TID) | INTRAMUSCULAR | Status: DC
  Administered 2023-06-07 – 2023-06-08 (×2): 1 [IU] via SUBCUTANEOUS
  Administered 2023-06-09: 2 [IU] via SUBCUTANEOUS
  Administered 2023-06-09: 1 [IU] via SUBCUTANEOUS
  Administered 2023-06-10 (×2): 2 [IU] via SUBCUTANEOUS
  Administered 2023-06-12: 1 [IU] via SUBCUTANEOUS
  Administered 2023-06-12: 2 [IU] via SUBCUTANEOUS
  Administered 2023-06-13: 1 [IU] via SUBCUTANEOUS
  Filled 2023-06-06 (×10): qty 1

## 2023-06-06 MED ORDER — LEVOTHYROXINE SODIUM 50 MCG PO TABS
75.0000 ug | ORAL_TABLET | Freq: Every day | ORAL | Status: DC
  Administered 2023-06-07 – 2023-06-13 (×6): 75 ug via ORAL
  Filled 2023-06-06 (×4): qty 2
  Filled 2023-06-06: qty 1
  Filled 2023-06-06: qty 2

## 2023-06-06 MED ORDER — HYDRALAZINE HCL 20 MG/ML IJ SOLN
5.0000 mg | INTRAMUSCULAR | Status: DC | PRN
  Administered 2023-06-07: 5 mg via INTRAVENOUS
  Filled 2023-06-06: qty 1

## 2023-06-06 MED ORDER — ONDANSETRON HCL 4 MG/2ML IJ SOLN: 4.0000 mg | Freq: Three times a day (TID) | INTRAMUSCULAR | Status: DC | PRN

## 2023-06-06 MED ORDER — OCUSOFT LID SCRUB ORIGINAL EX PADS: 1.0000 | MEDICATED_PAD | Freq: Every day | CUTANEOUS | Status: DC

## 2023-06-06 MED ORDER — SODIUM CHLORIDE 0.9 % IV SOLN
2.0000 g | Freq: Once | INTRAVENOUS | Status: AC
  Administered 2023-06-06: 2 g via INTRAVENOUS
  Filled 2023-06-06: qty 20

## 2023-06-06 MED ORDER — ALLOPURINOL 100 MG PO TABS
200.0000 mg | ORAL_TABLET | Freq: Every day | ORAL | Status: DC
  Administered 2023-06-07 – 2023-06-13 (×7): 200 mg via ORAL
  Filled 2023-06-06 (×7): qty 2

## 2023-06-06 MED ORDER — PANTOPRAZOLE SODIUM 40 MG PO TBEC
40.0000 mg | DELAYED_RELEASE_TABLET | Freq: Every day | ORAL | Status: DC
  Administered 2023-06-07 – 2023-06-13 (×7): 40 mg via ORAL
  Filled 2023-06-06 (×7): qty 1

## 2023-06-06 MED ORDER — SODIUM CHLORIDE 0.9 % IV BOLUS
500.0000 mL | Freq: Once | INTRAVENOUS | Status: AC
  Administered 2023-06-06: 500 mL via INTRAVENOUS

## 2023-06-06 MED ORDER — SODIUM CHLORIDE 0.9 % IV SOLN
1.0000 g | Freq: Three times a day (TID) | INTRAVENOUS | Status: DC
  Administered 2023-06-06 – 2023-06-12 (×17): 1 g via INTRAVENOUS
  Filled 2023-06-06 (×18): qty 20

## 2023-06-06 MED ORDER — SODIUM CHLORIDE 0.9 % IV SOLN
500.0000 mg | Freq: Once | INTRAVENOUS | Status: AC
  Administered 2023-06-06: 500 mg via INTRAVENOUS
  Filled 2023-06-06: qty 5

## 2023-06-06 MED ORDER — NORTRIPTYLINE HCL 10 MG PO CAPS
20.0000 mg | ORAL_CAPSULE | Freq: Every day | ORAL | Status: DC
  Administered 2023-06-07 – 2023-06-12 (×6): 20 mg via ORAL
  Filled 2023-06-06 (×6): qty 2

## 2023-06-06 MED ORDER — IPRATROPIUM-ALBUTEROL 0.5-2.5 (3) MG/3ML IN SOLN
3.0000 mL | RESPIRATORY_TRACT | Status: DC
  Administered 2023-06-06 – 2023-06-08 (×8): 3 mL via RESPIRATORY_TRACT
  Filled 2023-06-06 (×11): qty 3

## 2023-06-06 MED ORDER — MIDODRINE HCL 5 MG PO TABS
10.0000 mg | ORAL_TABLET | Freq: Three times a day (TID) | ORAL | Status: DC
  Administered 2023-06-06 – 2023-06-09 (×8): 10 mg via ORAL
  Filled 2023-06-06 (×7): qty 2

## 2023-06-06 MED ORDER — APIXABAN 5 MG PO TABS
5.0000 mg | ORAL_TABLET | Freq: Two times a day (BID) | ORAL | Status: DC
  Administered 2023-06-07 – 2023-06-13 (×13): 5 mg via ORAL
  Filled 2023-06-06 (×13): qty 1

## 2023-06-06 MED ORDER — INSULIN ASPART 100 UNIT/ML IJ SOLN: 0.0000 [IU] | Freq: Every day | INTRAMUSCULAR | Status: DC

## 2023-06-06 MED ORDER — CARBIDOPA-LEVODOPA 25-100 MG PO TABS
1.0000 | ORAL_TABLET | Freq: Three times a day (TID) | ORAL | Status: DC
  Administered 2023-06-07 – 2023-06-13 (×20): 1 via ORAL
  Filled 2023-06-06 (×21): qty 1

## 2023-06-06 MED ORDER — LACTULOSE 10 GM/15ML PO SOLN
30.0000 g | Freq: Every day | ORAL | Status: DC
  Administered 2023-06-07 – 2023-06-08 (×2): 30 g via ORAL
  Filled 2023-06-06 (×3): qty 60

## 2023-06-06 MED ORDER — ATORVASTATIN CALCIUM 20 MG PO TABS
20.0000 mg | ORAL_TABLET | Freq: Every day | ORAL | Status: DC
  Administered 2023-06-07 – 2023-06-12 (×6): 20 mg via ORAL
  Filled 2023-06-06 (×6): qty 1

## 2023-06-06 MED ORDER — HYDROXYZINE HCL 25 MG PO TABS: 25.0000 mg | ORAL_TABLET | Freq: Four times a day (QID) | ORAL | Status: DC | PRN

## 2023-06-06 MED ORDER — ALBUTEROL SULFATE (2.5 MG/3ML) 0.083% IN NEBU
2.5000 mg | INHALATION_SOLUTION | RESPIRATORY_TRACT | Status: DC | PRN
  Administered 2023-06-09 – 2023-06-10 (×2): 2.5 mg via RESPIRATORY_TRACT
  Filled 2023-06-06 (×2): qty 3

## 2023-06-06 MED ORDER — METOPROLOL TARTRATE 5 MG/5ML IV SOLN: 2.5000 mg | INTRAVENOUS | Status: DC | PRN

## 2023-06-06 MED ORDER — ACETAMINOPHEN 325 MG PO TABS: 650.0000 mg | ORAL_TABLET | Freq: Four times a day (QID) | ORAL | Status: DC | PRN

## 2023-06-06 MED ORDER — GABAPENTIN 400 MG PO CAPS
800.0000 mg | ORAL_CAPSULE | Freq: Four times a day (QID) | ORAL | Status: DC
  Administered 2023-06-07 – 2023-06-13 (×24): 800 mg via ORAL
  Filled 2023-06-06 (×5): qty 2
  Filled 2023-06-06: qty 8
  Filled 2023-06-06 (×10): qty 2
  Filled 2023-06-06: qty 8
  Filled 2023-06-06 (×11): qty 2
  Filled 2023-06-06: qty 8

## 2023-06-06 MED ORDER — DM-GUAIFENESIN ER 30-600 MG PO TB12
1.0000 | ORAL_TABLET | Freq: Two times a day (BID) | ORAL | Status: DC | PRN
  Administered 2023-06-12: 1 via ORAL
  Filled 2023-06-06: qty 1

## 2023-06-06 MED ORDER — FERROUS SULFATE 325 (65 FE) MG PO TABS
325.0000 mg | ORAL_TABLET | Freq: Every day | ORAL | Status: DC
  Administered 2023-06-07 – 2023-06-12 (×6): 325 mg via ORAL
  Filled 2023-06-06 (×6): qty 1

## 2023-06-06 NOTE — Sepsis Progress Note (Signed)
 Sepsis protocol monitored by eLink ?

## 2023-06-06 NOTE — ED Notes (Signed)
 2 RNs at bedside - unable to obtain IV access. STAT IV team consult placed. Lab called for blood draw

## 2023-06-06 NOTE — ED Notes (Signed)
 IV team at bedside

## 2023-06-06 NOTE — ED Provider Notes (Signed)
 [facility] Provider Note    Event Date/Time   First MD Initiated Contact with Patient 06/06/23 1424     (approximate)   History   No chief complaint on file.   HPI  Raymond Hays is a 78 year old male with history of A-fib, COPD, HTN, T2DM, CHF presenting to the emergency department for evaluation of shortness of breath.  Per report, about an hour prior to presentation, patient had onset of difficulty breathing.  With EMS, patient was noted to be hypoxic with lower extremity edema, somnolent but responsive to verbal stimuli.  Initially hypoxic on 2 L, increased to 5 L with persistent hypoxia, so he was transition to nonrebreather.     Physical Exam   Triage Vital Signs: ED Triage Vitals  Encounter Vitals Group     BP 06/06/23 1430 100/70     Systolic BP Percentile --      Diastolic BP Percentile --      Pulse Rate 06/06/23 1430 86     Resp 06/06/23 1430 (!) 26     Temp 06/06/23 1431 99.1 F (37.3 C)     Temp Source 06/06/23 1431 Oral     SpO2 06/06/23 1430 97 %     Weight 06/06/23 1431 212 lb (96.2 kg)     Height 06/06/23 1431 5\' 7"  (1.702 m)     Head Circumference --      Peak Flow --      Pain Score 06/06/23 1431 0     Pain Loc --      Pain Education --      Exclude from Growth Chart --     Most recent vital signs: Vitals:   06/06/23 1500 06/06/23 1515  BP:    Pulse: 83 85  Resp: (!) 25 15  Temp:    SpO2: 100% 97%     General: Somnolent but arousable CV:  Regular rate, good peripheral perfusion.  Resp:  Tachypnea Somewhat labored respirations, coarse lung sounds throughout Abd:  Nondistended, soft, no appreciable tenderness Neuro:  No gross facial asymmetry, moving extremity spontaneously   ED Results / Procedures / Treatments   Labs (all labs ordered are listed, but only abnormal results are displayed) Labs Reviewed  COMPREHENSIVE METABOLIC PANEL - Abnormal; Notable for the following components:      Result Value    Glucose, Bld 147 (*)    BUN 28 (*)    Albumin 2.7 (*)    AST 12 (*)    GFR, Estimated 60 (*)    All other components within normal limits  CBC WITH DIFFERENTIAL/PLATELET - Abnormal; Notable for the following components:   WBC 23.8 (*)    RBC 3.99 (*)    Hemoglobin 12.1 (*)    HCT 37.0 (*)    RDW 18.7 (*)    Neutro Abs 19.2 (*)    Monocytes Absolute 1.6 (*)    Abs Immature Granulocytes 0.28 (*)    All other components within normal limits  BRAIN NATRIURETIC PEPTIDE - Abnormal; Notable for the following components:   B Natriuretic Peptide 320.8 (*)    All other components within normal limits  BLOOD GAS, ARTERIAL - Abnormal; Notable for the following components:   pH, Arterial 7.52 (*)    pO2, Arterial 77 (*)    Bicarbonate 35.9 (*)    Acid-Base Excess 11.6 (*)    All other components within normal limits  RESP PANEL BY RT-PCR (RSV, FLU A&B, COVID)  [id number]  CULTURE,  BLOOD (ROUTINE X 2) W REFLEX TO ID PANEL  CULTURE, BLOOD (ROUTINE X 2) W REFLEX TO ID PANEL  LACTIC ACID, PLASMA  LACTIC ACID, PLASMA  URINALYSIS, W/ REFLEX TO CULTURE (INFECTION SUSPECTED)  TROPONIN I (HIGH SENSITIVITY)     EKG EKG independently reviewed interpreted by myself (ER attending) demonstrates:  EKG demonstrate A-fib at a rate of 88, QRS 112, QTc 419, No acute ST changes  RADIOLOGY Imaging independently reviewed and interpreted by myself demonstrates:  CXR without obvious focal consolidation, scattered opacities noted, formal radiology read pending  PROCEDURES:  Critical Care performed: Yes, see critical care procedure note(s)  CRITICAL CARE Performed by: Trinna Post   Total critical care time: 32 minutes  Critical care time was exclusive of separately billable procedures and treating other patients.  Critical care was necessary to treat or prevent imminent or life-threatening deterioration.  Critical care was time spent personally by me on the following activities: development of treatment  plan with patient and/or surrogate as well as nursing, discussions with consultants, evaluation of patient's response to treatment, examination of patient, obtaining history from patient or surrogate, ordering and performing treatments and interventions, ordering and review of laboratory studies, ordering and review of radiographic studies, pulse oximetry and re-evaluation of patient's condition.   Procedures   MEDICATIONS ORDERED IN ED: Medications  sodium chloride 0.9 % bolus 500 mL (has no administration in time range)  cefTRIAXone (ROCEPHIN) 2 g in sodium chloride 0.9 % 100 mL IVPB (has no administration in time range)  azithromycin (ZITHROMAX) 500 mg in sodium chloride 0.9 % 250 mL IVPB (has no administration in time range)     IMPRESSION / MDM / ASSESSMENT AND PLAN / ED COURSE  I reviewed the triage vital signs and the nursing notes.  Differential diagnosis includes, but is not limited to, COPD exacerbation, CHF exacerbation, pneumonia, viral illness  Patient's presentation is most consistent with acute presentation with potential threat to life or bodily function.  51 male presenting with shortness of breath.  Tachypneic on presentation with somewhat soft blood pressure.  Somewhat broad initial differential, labs including lactate and blood cultures sent.  These did return with elevated white blood cell count at 23.8 meeting sepsis criteria, so patient was ordered for empiric Rocephin and azithromycin for suspected respiratory source.  BNP slightly elevated at 320.  Did have downtrend in blood pressure for which small fluid bolus was ordered.  Signed out to oncoming physician pending additional labs, likely admission in the setting of shortness of breath with acute on chronic hypoxia.     FINAL CLINICAL IMPRESSION(S) / ED DIAGNOSES   Final diagnoses:  Shortness of breath  Hypoxia     Rx / DC Orders   ED Discharge Orders     None        Note:  This document was  prepared using Dragon voice recognition software and may include unintentional dictation errors.   Trinna Post, MD 06/06/23 1556

## 2023-06-06 NOTE — ED Notes (Addendum)
 Spoke to pt wife Raymond Hays and she would like contacted with further updates [phone]

## 2023-06-06 NOTE — ED Provider Notes (Addendum)
  Physical Exam  BP 94/73   Pulse 75   Temp 99.1 F (37.3 C) (Oral)   Resp (!) 23   Ht 5\' 7"  (1.702 m)   Wt 96.2 kg   SpO2 99%   BMI 33.20 kg/m   Physical Exam  Procedures  SUPRAPUBIC TUBE PLACEMENT  Date/Time: 06/06/2023 6:52 PM  Performed by: Janith Lima, MD Authorized by: Janith Lima, MD   Consent:    Consent obtained:  Verbal   Consent given by:  Patient   Risks, benefits, and alternatives were discussed: yes     Risks discussed:  Bleeding and bowel perforation   Alternatives discussed:  No treatment Universal protocol:    Patient identity confirmed:  Verbally with patient Sedation:    Sedation type:  None Anesthesia:    Anesthesia method:  None Procedure details:    Complexity:  Simple   Catheter type:  Foley   Catheter size:  20 Fr   Ultrasound guidance: no     Number of attempts:  1   Urine characteristics:  Clear Post-procedure details:    Procedure completion:  Tolerated well, no immediate complications Comments:     Suprapubic catheter exchange   ED Course / MDM    Medical Decision Making Amount and/or Complexity of Data Reviewed Labs: ordered. Radiology: ordered.  Risk Decision regarding hospitalization.   Patient is a 78 year old male received in handoff.  Presented for worsening shortness of breath.  Reportedly hypoxic on his baseline 2 L and had to be increased to 5 L and eventually nonrebreather.  Noted to have leukocytosis at 23.8.  BNP elevated from his baseline.  Signed out pending results of x-rays and further workup.  Patient was negative for COVID, flu, RSV.  Chest x-ray with no obvious pneumonia but more concerning a potential CHF exacerbation.  He was able to be titrated back down to his baseline 2 L but notable rales throughout.  Separately, does look like he has a UTI and with his elevated white blood cell count, will continue empiric antibiotic treatment.  Will admit to hospitalist with patient meeting sepsis criteria for  ongoing care.   Janith Lima, MD 06/06/23 Rickey Primus  Separately, patient suprapubic catheter was dislodged at the bedside.  I replaced this at the bedside.   Janith Lima, MD 06/06/23 [phone]

## 2023-06-06 NOTE — ED Triage Notes (Signed)
 Pt to ED Via [facility] for breathing difficulty x 1 hour. Pt was give 1 albuterol treatment by [facility]. Baseline O2 2L [place].Hx of CHF. EMS reports pt having +2 edema to BLE. Alert to verbal stimuli. EMS reports pt has been lethargic since arrival.  EMS reports pt is having difficulty speaking due to pt coughing when attempting to talk. Unknown baseline mental status.   EMS vitals  100% NRB  70 NSR  BP 89/63 etCO2 25 RR 18

## 2023-06-06 NOTE — Consult Note (Signed)
 CODE SEPSIS - PHARMACY COMMUNICATION  **Broad-spectrum antimicrobials should be administered within one hour of sepsis diagnosis**  Time Code Sepsis call or page was received: 1556  Antibiotics ordered: Ceftriaxone, Azithromycin  Time of first antibiotic administration: 1602  Additional action taken by pharmacy: N/A  If necessary, name of provider/nurse contacted: N/A    Will M. Dareen Piano, PharmD Clinical Pharmacist 06/06/2023 3:53 PM

## 2023-06-06 NOTE — ED Notes (Signed)
 EDP and RN at bedside - 70fr suprapubic cath replaced by EDP. Pt tolerated well

## 2023-06-06 NOTE — H&P (Addendum)
 History and Physical    Raymond Hays ZOX:[id number] DOB: 1945-10-22 DOA: 06/06/2023  Referring MD/NP/PA:   PCP: Pearson Grippe, MD   Patient coming from:  The patient is coming from SNF.     Chief Complaint: SOB and AMS  HPI: Raymond Hays is a 78 y.o. 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  Patient has AMS,  and is unable to provide accurate medical history, therefore, most of the history is obtained by discussing the case with ED physician, per EMS report, and with the nursing staff.    Per report, pt was found to have SOB with hypoxia, initially required 5 L oxygen and NRB in ED, then wean off to home level 2 L oxygen with 99% of saturation when I saw patient in ED.  Patient has cough with little mucus production. Pt has  No chest pain.  No active nausea, vomiting, diarrhea noted.  Patient has asymmetric leg edema, the right leg is worse than the left.  Per report, patient's suprapubic catheter has dislodged which was changed by EDP.  Pt is confused when I saw pt in ED. He knows his own name, not orientated to the place that time.  He will extremities.  No facial droop.   Data reviewed independently and ED Course: pt was found to have BNP 320, positive urinalysis (cloudy appearance, large amount of leukocyte, few bacteria, WBC > 50), negative PCR for COVID, flu and RSV, GFR 60, troponin 15, lactic acid 11.4, temperature 99.1, softer blood pressure 90/69, heart rate 90, RR 29.  Chest x-ray showed cardiomegaly without infiltration.  Patient is admitted to telemetry bed as inpatient.   EKG: I have personally reviewed.  A-fib, QTc 419, and unstable baseline   Review of Systems: Could not be reviewed due to  altered mental status.     Allergy: No Known Allergies  Past Medical History:  Diagnosis Date   Allergic rhinitis    Anxiety    Arthritis    Atrial fibrillation (HCC)    Chronic indwelling Foley catheter    COPD (chronic obstructive pulmonary disease) (HCC)    Cough    Diabetes (HCC)    Essential (primary) hypertension    Hemorrhoid    Hyperlipidemia    PAF (paroxysmal atrial fibrillation) (HCC)    Primary osteoarthritis, unspecified shoulder    Retention of urine, unspecified    Spinal stenosis    UTI (urinary tract infection)     Past Surgical History:  Procedure Laterality Date   APPENDECTOMY     BACK SURGERY     COLONOSCOPY N/A 10/27/2020   Procedure: COLONOSCOPY;  Surgeon: Wyline Mood, MD;  Location: [facility] ENDOSCOPY;  Service: Gastroenterology;  Laterality: N/A;   ELBOW SURGERY     ESOPHAGOGASTRODUODENOSCOPY (EGD) WITH PROPOFOL N/A 10/27/2020   Procedure: ESOPHAGOGASTRODUODENOSCOPY (EGD) WITH PROPOFOL;  Surgeon: Wyline Mood, MD;  Location: [facility] ENDOSCOPY;  Service: Gastroenterology;  Laterality: N/A;   HERNIA REPAIR     KNEE SURGERY     neck     PARTIAL HIP ARTHROPLASTY Bilateral    TEE WITHOUT CARDIOVERSION N/A 10/19/2020   Procedure: TRANSESOPHAGEAL ECHOCARDIOGRAM (TEE);  Surgeon: Antonieta Iba, MD;  Location: [facility] ORS;  Service: Cardiovascular;  Laterality: N/A;   TONSILLECTOMY      Social History:  reports that he quit smoking about 19 years ago. His smoking use included cigarettes. He started smoking about 29 years ago. He has a 5 pack-year smoking history. He has never used smokeless tobacco. He reports that he does not currently use alcohol. He reports that he does not currently use drugs after having used the following drugs: "Crack" cocaine.  Family History:  Family History  Problem Relation Age of Onset   Healthy Son    Healthy Son      Prior to Admission medications   Medication Sig Start Date End Date Taking? Authorizing Provider  acetaminophen  (TYLENOL) 325 MG tablet Take 2 tablets (650 mg total) by mouth every 6 (six) hours as needed for mild pain (pain score 1-3), fever or moderate pain (pain score 4-6). 05/09/23   Sunnie Nielsen, DO  Albuterol Sulfate (PROAIR RESPICLICK) 108 (90 Base) MCG/ACT AEPB Inhale 2 puffs into the lungs every 4 (four) hours as needed (COPD).    [provider]  allopurinol (ZYLOPRIM) 100 MG tablet Take 200 mg by mouth daily.    [provider]  apixaban (ELIQUIS) 5 MG TABS tablet Take 5 mg by mouth 2 (two) times daily.     [provider]  ascorbic acid (VITAMIN C) 500 MG tablet Take 500 mg by mouth daily.    [provider]  atorvastatin (LIPITOR) 20 MG tablet Take 20 mg by mouth at bedtime.    [provider]  bisacodyl (DULCOLAX) 10 MG suppository Place 10 mg rectally every 3 (three) days. (HOLD if having loose stools)    [provider]  calcium carbonate (TUMS - DOSED IN MG ELEMENTAL CALCIUM) 500 MG chewable tablet Chew 2 tablets by mouth daily as needed for indigestion or heartburn. 01/15/23   [provider]  carbidopa-levodopa (SINEMET IR) 25-100 MG tablet Take 1 tablet by mouth 3 (three) times daily before meals.    [provider]  cetirizine (ZYRTEC) 5 MG tablet Take 5 mg by mouth at bedtime.    [provider]  Cholecalciferol (VITAMIN D3) 50 MCG (2000 UT) TABS Take 1 tablet by mouth daily. 04/22/23   [provider]  citalopram (CELEXA) 10 MG tablet Take 10 mg by mouth at bedtime.    [provider]  dextromethorphan-guaiFENesin (MUCINEX DM) 30-600 MG 12hr tablet Take 1 tablet by mouth 2 (two) times daily as needed for cough. 05/09/23   Sunnie Nielsen, DO  diltiazem (CARDIZEM CD) 240 MG 24 hr capsule Take 1 capsule (240 mg total) by mouth daily. 03/17/23   Shalhoub, Deno Lunger, MD  docusate sodium (COLACE) 100 MG capsule Take 100 mg by mouth daily.    [provider]  Emollient (CETAPHIL) cream  Apply 1 Application topically 2 (two) times daily as needed.    [provider]  Eyelid Cleansers (OCUSOFT LID SCRUB ORIGINAL) PADS Place 1 Application into both eyes daily. Wipe both eyelids once per day.    [provider]  Eyelid Cleansers (OCUSOFT LID SCRUB ORIGINAL) PADS Apply 1 Pad topically every 12 (twelve) hours as needed.    [provider]  famotidine (PEPCID) 20 MG tablet Take 20 mg by mouth at bedtime.    [provider]  feeding supplement (ENSURE ENLIVE / ENSURE PLUS) LIQD Take 237 mLs by mouth 2 (two) times daily between meals. 05/10/23   Sunnie Nielsen, DO  ferrous sulfate 324 (65 Fe) MG TBEC Take 1 tablet by mouth daily. 12/05/22   [provider]  fluticasone (  FLONASE) 50 MCG/ACT nasal spray Place 2 sprays into both nostrils daily.    [provider]  Fluticasone-Umeclidin-Vilant 100-62.5-25 MCG/ACT AEPB Inhale 1 puff into the lungs daily.    [provider]  furosemide (LASIX) 20 MG tablet Take 2 tablets (40 mg total) by mouth daily. 05/09/23   Sunnie Nielsen, DO  gabapentin (NEURONTIN) 800 MG tablet Take 800 mg by mouth 4 (four) times daily.    [provider]  hydrocortisone (ANUSOL-HC) 25 MG suppository Place 25 mg rectally every 4 (four) hours as needed for hemorrhoids or anal itching.    [provider]  hydrOXYzine (ATARAX/VISTARIL) 25 MG tablet Take 25 mg by mouth every 6 (six) hours as needed for itching. 02/17/20   [provider]  insulin glargine-yfgn (SEMGLEE) 100 UNIT/ML injection Inject 0.1 mLs (10 Units total) into the skin daily. 05/10/23   Sunnie Nielsen, DO  ipratropium (ATROVENT) 0.06 % nasal spray Place 2 sprays into both nostrils 3 (three) times daily. 11/04/22   [provider]  ipratropium-albuterol (DUONEB) 0.5-2.5 (3) MG/3ML SOLN Take 3 mLs by nebulization every 6 (six) hours.    [provider]  lactase (LACTAID) 3000 units tablet Take 3,000 Units  by mouth 3 (three) times daily with meals.    [provider]  lactulose (CHRONULAC) 10 GM/15ML solution Take 45 mLs (30 g total) by mouth daily. 05/10/23   Sunnie Nielsen, DO  levothyroxine (SYNTHROID) 75 MCG tablet Take 75 mcg by mouth daily before breakfast.    [provider]  lidocaine 4 % Place 1 patch onto the skin daily as needed (pain). (Remove after 12 hours)    [provider]  melatonin 5 MG TABS Take 10 mg by mouth at bedtime.    [provider]  Menthol, Topical Analgesic, (BIOFREEZE) 4 % GEL Apply 1 application  topically 3 (three) times daily. (Apply to neck)    [provider]  METAMUCIL 4 IN 1 FIBER 51.7 % PACK Take 1 packet by mouth 3 (three) times daily. 03/17/23   [provider]  methocarbamol (ROBAXIN) 500 MG tablet Take 1,000 mg by mouth every 12 (twelve) hours. At 1400    [provider]  metoprolol tartrate (LOPRESSOR) 50 MG tablet Take 1 tablet (50 mg total) by mouth 2 (two) times daily. 05/09/23   Sunnie Nielsen, DO  nortriptyline (PAMELOR) 10 MG capsule Take 20 mg by mouth at bedtime. 12/05/22   [provider]  oxybutynin (DITROPAN) 5 MG tablet Take 5 mg by mouth 2 (two) times daily.    [provider]  pantoprazole (PROTONIX) 40 MG tablet Take 1 tablet (40 mg total) by mouth daily. 05/10/23   Sunnie Nielsen, DO  polyethylene glycol (MIRALAX / GLYCOLAX) 17 g packet Take 17 g by mouth daily.    [provider]  pramoxine (SARNA SENSITIVE) 1 % LOTN Apply 1 Application topically 4 (four) times daily as needed.    [provider]  Probiotic Product (ACIDOPHILUS PROBIOTIC BLEND) CAPS Take 1 capsule by mouth daily. 12/05/22   [provider]  Propylene Glycol 0.6 % SOLN Place 2 drops into both eyes in the morning, at noon, and at bedtime.    [provider]  psyllium (METAMUCIL SMOOTH TEXTURE) 58.6 % powder Take 1 packet by mouth 3 (three) times daily. 09/29/22    Chesley Noon, MD  selenium sulfide (SELSUN) 1 % LOTN Apply 1 Application topically 2 (two) times a week. (Tuesday and Friday)    [provider]  senna (SENOKOT) 8.6 MG TABS tablet Take 1 tablet (8.6 mg total) by mouth daily as needed for mild constipation. 09/29/22   Chesley Noon, MD  silver sulfADIAZINE (SILVADENE) 1 % cream Apply 1 Application topically daily. (Apply to arms and face)    [provider]  sodium chloride (OCEAN) 0.65 % SOLN nasal spray Place 2 sprays into both nostrils every 6 (six) hours as needed for congestion.    [provider]  sodium phosphate (FLEET) ENEM Place 133 mLs (1 enema total) rectally daily as needed for severe constipation. 05/09/23   Sunnie Nielsen, DO  tacrolimus (PROTOPIC) 0.03 % ointment Apply 1 application  topically daily. (Apply to eyelids)    [provider]  terbinafine (LAMISIL) 1 % cream Apply 1 Application topically at bedtime. (Apply to feet)    [provider]  tiZANidine (ZANAFLEX) 2 MG tablet Take 2 mg by mouth 2 (two) times daily as needed for muscle spasms.    [provider]  triamcinolone cream (KENALOG) 0.1 % Apply 1 Application topically daily as needed. Apply to arms, legs, and back r/t rash    [provider]    Physical Exam: Vitals:   06/06/23 1730 06/06/23 1740 06/06/23 1900 06/06/23 1930  BP: 94/73  98/67 103/71  Pulse: 75 75 77 78  Resp: (!) 23 (!) 23    Temp:      TempSrc:      SpO2: 100% 99% 100% 100%  Weight:      Height:       General: Not in acute distress HEENT:       Eyes: PERRL, EOMI, no jaundice       ENT: No discharge from the ears and nose, no pharynx injection, no tonsillar enlargement.        Neck: Positive JVD, no bruit, no mass felt. Heme: No neck lymph node enlargement. Cardiac: S1/S2, irregularly irregular rhythm, no gallops or rubs. Respiratory: Has crackles bilaterally GI: Soft, nondistended, nontender, no organomegaly, BS  present. GU: No hematuria Ext: Has asymmetric leg edema, 2+ in right leg, 1+ in left leg,  musculoskeletal: No joint deformities, No joint redness or warmth, no limitation of ROM in spin. Skin: No rashes.  Neuro: Patient is confused, knows his own name, not orientated to the place that time.  Moves all extremities.  Psych: Patient is not psychotic, no suicidal or hemocidal ideation.  Labs on Admission: I have personally reviewed following labs and imaging studies  CBC: Recent Labs  Lab 06/06/23 1414  WBC 23.8*  NEUTROABS 19.2*  HGB 12.1*  HCT 37.0*  MCV 92.7  PLT 275   Basic Metabolic Panel: Recent Labs  Lab 06/06/23 1414  NA 137  K 4.2  CL 98  CO2 30  GLUCOSE 147*  BUN 28*  CREATININE 1.23  CALCIUM 9.0   GFR: Estimated Creatinine Clearance: 53.8 mL/min (by C-G formula based on SCr of 1.23 mg/dL). Liver Function Tests: Recent Labs  Lab 06/06/23 1414  AST 12*  ALT 5  ALKPHOS 85  BILITOT 1.2  PROT 6.6  ALBUMIN 2.7*   No results for input(s): "LIPASE", "AMYLASE" in the last 168 hours. No results for input(s): "AMMONIA" in the last 168 hours. Coagulation Profile: No results for input(s): "INR", "PROTIME" in the last 168 hours. Cardiac Enzymes: No results for input(s): "CKTOTAL", "CKMB", "CKMBINDEX", "TROPONINI" in the last 168 hours. BNP (last 3 results) No results for input(s): "PROBNP" in the last 8760 hours. HbA1C: No results  for input(s): "HGBA1C" in the last 72 hours. CBG: No results for input(s): "GLUCAP" in the last 168 hours. Lipid Profile: No results for input(s): "CHOL", "HDL", "LDLCALC", "TRIG", "CHOLHDL", "LDLDIRECT" in the last 72 hours. Thyroid Function Tests: No results for input(s): "TSH", "T4TOTAL", "FREET4", "T3FREE", "THYROIDAB" in the last 72 hours. Anemia Panel: No results for input(s): "VITAMINB12", "FOLATE", "FERRITIN", "TIBC", "IRON", "RETICCTPCT" in the last 72 hours. Urine analysis:    Component Value Date/Time   COLORURINE  YELLOW (A) 06/06/2023 1434   APPEARANCEUR CLOUDY (A) 06/06/2023 1434   LABSPEC 1.012 06/06/2023 1434   PHURINE 6.0 06/06/2023 1434   GLUCOSEU NEGATIVE 06/06/2023 1434   HGBUR MODERATE (A) 06/06/2023 1434   BILIRUBINUR NEGATIVE 06/06/2023 1434   KETONESUR NEGATIVE 06/06/2023 1434   PROTEINUR NEGATIVE 06/06/2023 1434   NITRITE NEGATIVE 06/06/2023 1434   LEUKOCYTESUR LARGE (A) 06/06/2023 1434   Sepsis Labs: @LABRCNTIP (procalcitonin:4,lacticidven:4) ) Recent Results (from the past 240 hours)  Resp panel by RT-PCR (RSV, Flu A&B, Covid) Anterior Nasal Swab     Status: None   Collection Time: 06/06/23  2:50 PM   Specimen: Anterior Nasal Swab  Result Value Ref Range Status   SARS Coronavirus 2 by RT PCR NEGATIVE NEGATIVE Final    Comment: (NOTE) SARS-CoV-2 target nucleic acids are NOT DETECTED.  The SARS-CoV-2 RNA is generally detectable in upper respiratory specimens during the acute phase of infection. The lowest concentration of SARS-CoV-2 viral copies this assay can detect is 138 copies/mL. A negative result does not preclude SARS-Cov-2 infection and should not be used as the sole basis for treatment or other patient management decisions. A negative result may occur with  improper specimen collection/handling, submission of specimen other than nasopharyngeal swab, presence of viral mutation(s) within the areas targeted by this assay, and inadequate number of viral copies(<138 copies/mL). A negative result must be combined with clinical observations, patient history, and epidemiological information. The expected result is Negative.  Fact Sheet for Patients:  BloggerCourse.com  Fact Sheet for Healthcare Providers:  SeriousBroker.it  This test is no t yet approved or cleared by the [place] FDA and  has been authorized for detection and/or diagnosis of SARS-CoV-2 by FDA under an Emergency Use Authorization (EUA). This EUA  will remain  in effect (meaning this test can be used) for the duration of the COVID-19 declaration under Section 564(b)(1) of the Act, 21 U.S.C.section 360bbb-3(b)(1), unless the authorization is terminated  or revoked sooner.       Influenza A by PCR NEGATIVE NEGATIVE Final   Influenza B by PCR NEGATIVE NEGATIVE Final    Comment: (NOTE) The Xpert Xpress SARS-CoV-2/FLU/RSV plus assay is intended as an aid in the diagnosis of influenza from Nasopharyngeal swab specimens and should not be used as a sole basis for treatment. Nasal washings and aspirates are unacceptable for Xpert Xpress SARS-CoV-2/FLU/RSV testing.  Fact Sheet for Patients: BloggerCourse.com  Fact Sheet for Healthcare Providers: SeriousBroker.it  This test is not yet approved or cleared by the [place] FDA and has been authorized for detection and/or diagnosis of SARS-CoV-2 by FDA under an Emergency Use Authorization (EUA). This EUA will remain in effect (meaning this test can be used) for the duration of the COVID-19 declaration under Section 564(b)(1) of the Act, 21 U.S.C. section 360bbb-3(b)(1), unless the authorization is terminated or revoked.     Resp Syncytial Virus by PCR NEGATIVE NEGATIVE Final    Comment: (NOTE) Fact Sheet for Patients: BloggerCourse.com  Fact Sheet for Healthcare Providers: SeriousBroker.it  This test is not yet approved or cleared by the [place] and has been authorized for detection and/or diagnosis of SARS-CoV-2 by FDA under an Emergency Use Authorization (EUA). This EUA will remain in effect (meaning this test can be used) for the duration of the COVID-19 declaration under Section 564(b)(1) of the Act, 21 U.S.C. section 360bbb-3(b)(1), unless the authorization is terminated or revoked.  Performed at [facility], [address] [place], [place]  [address]      Radiological Exams on Admission:   Assessment/Plan Principal Problem:   Acute on chronic diastolic CHF (congestive heart failure) (HCC) Active Problems:   Complicated UTI (urinary tract infection)   Suprapubic catheter (HCC)   Acute metabolic encephalopathy   AF (paroxysmal atrial fibrillation) (HCC)   Essential hypertension   Type 2 diabetes mellitus with peripheral neuropathy (HCC)   Parkinson disease (HCC)   Hypothyroidism   HLD (hyperlipidemia)   Depression with anxiety   Obesity (BMI 30-39.9)   Assessment and Plan:   Acute on chronic diastolic CHF (congestive heart failure) (HCC): pt has shortness breath, leg edema, elevated BNP 320, crackles on auscultation, positive JVD, clinically consistent with CHF exacerbation.  2D echo on 09/16/2022 showed EF of 55 to 60%.  Patient has soft blood pressure, will start low-dose of Lasix.  -Will admit to tele bed as inpatient -will give one dose of Lasix  20 mg IV, and monitoring Bp closely -Start midodrine 10 mg tid -Daily weights -strict I/O's -Low salt diet -Fluid restriction -As needed bronchodilators for shortness of breath -f/u LE Doppler to rule out a DVT due to asymmetric leg edema.  Complicated UTI (urinary tract infection) in the setting of suprapubic catheter placement: Suprapubic catheter is changed in ED. -Meropenem -Follow-up urine culture and blood culture  Acute metabolic encephalopathy: Likely due to multifactorial etiology, including UTI, hypoxia -Fall precaution -Frequent neurocheck -Follow-up CT of head since patient is on Eliquis  A fib: HR is 90 -Hold Cardizem and metoprolol due to softer blood pressure -Continue Eliquis -As needed IV metoprolol 2.5 mg every 2 hour for heart rate> 125  Essential hypertension -IV hydralazine as needed -Hold Cardizem and metoprolol due to soft blood pressure   Parkinson disease (HCC) -Sinemet -Fall precaution   Hypothyroidism -Synthroid   HLD  (hyperlipidemia) -Lipitor   Depression with anxiety -Continue home medications   Obesity (BMI 30-39.9) -When patient mental status improves, will need counseling about importance of losing weight, taking healthy diet, doing more exercise.   Type 2 diabetes mellitus with peripheral neuropathy [facility]): Recent A1c 7.4, poorly controlled.  Patient is taking glargine insulin 10 units daily. -Glargine insulin 7 units daily -Sliding scale insulin     DVT ppx: on Eliquis  Code Status: DNR (pt has DNR form with him)  Family Communication:     not done, no family member is at bed side.     Disposition Plan:  Anticipate discharge back to previous environment  Consults called:  none  Admission status and Level of care: Telemetry Cardiac:  as inpt        Dispo: The patient is from: SNF              Anticipated d/c is to: SNF              Anticipated d/c date is: 2 days              Patient currently is not medically stable to d/c.    Severity of  Illness:  The appropriate patient status for this patient is INPATIENT. Inpatient status is judged to be reasonable and necessary in order to provide the required intensity of service to ensure the patient's safety. The patient's presenting symptoms, physical exam findings, and initial radiographic and laboratory data in the context of their chronic comorbidities is felt to place them at high risk for further clinical deterioration. Furthermore, it is not anticipated that the patient will be medically stable for discharge from the hospital within 2 midnights of admission.   * I certify that at the point of admission it is my clinical judgment that the patient will require inpatient hospital care spanning beyond 2 midnights from the point of admission due to high intensity of service, high risk for further deterioration and high frequency of surveillance required.*       Date of Service 06/06/2023    Lorretta Harp [facility]   If  7PM-7AM, please contact night-coverage [email or web address] 06/06/2023, 9:43 PM

## 2023-06-06 NOTE — Consult Note (Signed)
 Pharmacy Antibiotic Note  Raymond Hays is a 78 y.o. male admitted on 06/06/2023 with  shortness of breath .  Pharmacy has been consulted for Meropenem dosing.  Plan: Meropenem 1g IV Q8 hours  Height: 5\' 7"  (170.2 cm) Weight: 96.2 kg (212 lb) IBW/kg (Calculated) : 66.1  Temp (24hrs), Avg:99.1 F (37.3 C), Min:99.1 F (37.3 C), Max:99.1 F (37.3 C)  Recent Labs  Lab 06/06/23 1414 06/06/23 1759  WBC 23.8*  --   CREATININE 1.23  --   LATICACIDVEN 1.0 1.4    Estimated Creatinine Clearance: 53.8 mL/min (by C-G formula based on SCr of 1.23 mg/dL).    No Known Allergies  Antimicrobials this admission: Meropenem 3/7 >>  Azithro/rocephin 3/7 x 1  Dose adjustments this admission: N/A  Microbiology results: 3/7 BCx: collected 3/7 UCx: collected    Thank you for allowing pharmacy to be a part of this patient's care.  Bettey Costa 06/06/2023 7:44 PM

## 2023-06-06 NOTE — ED Notes (Signed)
 CCMD Called to monitor pt.

## 2023-06-06 NOTE — ED Notes (Signed)
 RT called for ABG.

## 2023-06-06 NOTE — ED Notes (Signed)
 BP cuff repositioned and repeat BP 90/69 with MAP 77

## 2023-06-06 NOTE — ED Notes (Signed)
 Portable equipment called for urology cart

## 2023-06-06 NOTE — ED Notes (Signed)
 This RN and Vet RN at bedside to change pt. Suprapubic cath out of site and balloon not inflated. EDP made aware

## 2023-06-06 NOTE — ED Notes (Signed)
Labs at bedside

## 2023-06-07 ENCOUNTER — Inpatient Hospital Stay

## 2023-06-07 DIAGNOSIS — I5033 Acute on chronic diastolic (congestive) heart failure: Secondary | ICD-10-CM

## 2023-06-07 LAB — BASIC METABOLIC PANEL
Anion gap: 8 (ref 5–15)
BUN: 27 mg/dL — ABNORMAL HIGH (ref 8–23)
CO2: 29 mmol/L (ref 22–32)
Calcium: 8.5 mg/dL — ABNORMAL LOW (ref 8.9–10.3)
Chloride: 100 mmol/L (ref 98–111)
Creatinine, Ser: 1.25 mg/dL — ABNORMAL HIGH (ref 0.61–1.24)
GFR, Estimated: 59 mL/min — ABNORMAL LOW (ref >60)
Glucose, Bld: 113 mg/dL — ABNORMAL HIGH (ref 70–99)
Potassium: 3.4 mmol/L — ABNORMAL LOW (ref 3.5–5.1)
Sodium: 137 mmol/L (ref 135–145)

## 2023-06-07 LAB — CBG MONITORING, ED
Glucose-Capillary: 102 mg/dL — ABNORMAL HIGH (ref 70–99)
Glucose-Capillary: 115 mg/dL — ABNORMAL HIGH (ref 70–99)
Glucose-Capillary: 123 mg/dL — ABNORMAL HIGH (ref 70–99)
Glucose-Capillary: 142 mg/dL — ABNORMAL HIGH (ref 70–99)

## 2023-06-07 LAB — CBC
HCT: 31.4 % — ABNORMAL LOW (ref 39.0–52.0)
Hemoglobin: 10.1 g/dL — ABNORMAL LOW (ref 13.0–17.0)
MCH: 30.2 pg (ref 26.0–34.0)
MCHC: 32.2 g/dL (ref 30.0–36.0)
MCV: 94 fL (ref 80.0–100.0)
Platelets: 214 10*3/uL (ref 150–400)
RBC: 3.34 MIL/uL — ABNORMAL LOW (ref 4.22–5.81)
RDW: 18.6 % — ABNORMAL HIGH (ref 11.5–15.5)
WBC: 15.1 10*3/uL — ABNORMAL HIGH (ref 4.0–10.5)
nRBC: 0 % (ref 0.0–0.2)

## 2023-06-07 LAB — MAGNESIUM: Magnesium: 2 mg/dL (ref 1.7–2.4)

## 2023-06-07 MED ORDER — SODIUM CHLORIDE 0.9 % IV BOLUS
250.0000 mL | Freq: Once | INTRAVENOUS | Status: AC
  Administered 2023-06-07: 250 mL via INTRAVENOUS

## 2023-06-07 MED ORDER — POTASSIUM CHLORIDE CRYS ER 20 MEQ PO TBCR
40.0000 meq | EXTENDED_RELEASE_TABLET | Freq: Once | ORAL | Status: AC
  Administered 2023-06-07: 40 meq via ORAL
  Filled 2023-06-07: qty 2

## 2023-06-07 NOTE — Progress Notes (Addendum)
 Progress Note   Patient: Raymond Hays BJY:[id number] DOB: May 30, 1945 DOA: 06/06/2023     1 DOS: the patient was seen and examined on 06/07/2023   Brief hospital course:  HPI on admission 06/06/23: "Raymond Hays is a 78 y.o. 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 "... See H&P for full HPI on admission & ED course.  Pt was admitted for further evaluation and management as outlined below. Treated initially with IV Lasix 20 mg x 1 dose. Started on empiric IV antibiotic for complicated UTI pending culture results.  Further hospital course and management as outlined below.   Assessment and Plan:  Acute on chronic diastolic CHF (congestive heart failure) (HCC): pt has shortness breath, leg edema, elevated BNP 320, crackles on auscultation, positive JVD, clinically consistent with CHF exacerbation.  2D echo on 09/16/2022 showed EF of 55 to 60%.  Patient has soft blood pressure, will start low-dose of Lasix. U/S doppler of BLE's negative for DVT's Given single dose IV Lasix 20 mg on admission --Further diuresis PRN and as BP tolerates --Started on midodrine 10 mg tid, continue for & wean as BP tolerates --Daily weights and strict I/O's --Low sodium diet and fluid restriction --PRN bronchodilators    Complicated UTI (urinary tract infection) in the setting of suprapubic catheter placement: Suprapubic catheter is changed in ED. --Continue empiric Meropenem --Follow urine culture and blood cultures   Acute metabolic encephalopathy: Likely due to multifactorial etiology, including UTI, hypoxia.  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 --Fall precaution --Neuro checks   Paroxysmal A  fib: HR controlled --Holding Cardizem and metoprolol due to soft BP's - resume when appropriate --Continue Eliquis --As needed IV metoprolol for now   Essential hypertension -IV hydralazine as needed -Hold Cardizem and metoprolol due to soft blood pressure   Parkinson disease  --Sinemet --Fall precautions  Type 2 diabetes mellitus with peripheral neuropathy (HCC): Recent A1c 7.4, poorly controlled.  Patient is taking glargine insulin 10 units daily. --Glargine insulin 7 units daily --Sliding scale insulin   Hypothyroidism --Continue Synthroid   HLD (hyperlipidemia) --Continue Lipitor   Depression with anxiety --Continue home medications   Obesity (BMI 30-39.9) Body mass index is 33.2 kg/m. Complicates overall care and prognosis.  Recommend lifestyle modifications including physical activity and diet for weight loss and overall long-term health.          Subjective: Pt seen in the ED holding for a bed this AM. He denies specific complaints, feels slightly better.  No SOB or CP.  He reports his right leg is chronically more swollen that left side and this is unchanged recently.  He does not have his glasses and struggling with TV remote etc.   Physical Exam: Vitals:   06/07/23 0820 06/07/23 0900 06/07/23 1000 06/07/23 1100  BP: (!) 152/112 (!) 162/148 (!) 90/59 90/67  Pulse: (!) 101 88 88 86  Resp: 18 19 (!) 22 17  Temp: 97.8 F (36.6 C)   98 F (36.7 C)  TempSrc: Oral   Oral  SpO2: 100% 97% 99% 98%  Weight:      Height:       General exam: awake, alert, no acute distress HEENT: moist mucus membranes, hearing grossly normal  Respiratory system: CTAB, no wheezes, rales  or rhonchi, normal respiratory effort. Cardiovascular system: normal S1/S2, RRR Gastrointestinal system: soft, NT, ND, no HSM felt, +bowel sounds. Genitourinary system: suprapubic catheter in place Central nervous system: A&O x3. no gross focal neurologic deficits, normal speech Extremities:  R>L lower extremity edema and BLE mild erythema without any differential warmth on palpation Skin: dry, intact, normal temperature Psychiatry: normal mood, congruent affect, judgement and insight appear normal   Data Reviewed:  Notable labs --   BMP with K 3.4, Glucose 113, BUN 27, Cr 1.25, Ca 8.5 CBC with WBC improved 23.8 >> 15.1, Hbg 10.1  Family Communication: Updated sister by phone this afternoon.  Disposition: Status is: Inpatient Remains inpatient appropriate because: remains on IV antibiotic pending culture results   Planned Discharge Destination: return to prior facility     Time spent: 42 minutes  Author: Pennie Banter, DO 06/07/2023 12:40 PM  For on call review [email or web address].

## 2023-06-07 NOTE — TOC Initial Note (Addendum)
 Transition of Care [facility]) - Initial/Assessment Note    Patient Details  Name: Raymond Hays MRN: [id number] Date of Birth: 11-06-1945  [facility] CM/SW Contact:    Liliana Cline, LCSW Phone Number: 06/07/2023, 9:43 AM  Clinical Narrative:                 Patient is from [facility] long term care. CSW reached out to Gavin Pound at [facility] to confirm patient can return - and see if [place] [facility] [place] is needed as it was on previous admission. Awaiting response from [employer].  11:10- Gavin Pound from [facility] states patient would need a new [place] auth for long term care IF he is gone from [facility] for 72 hours. Patient left [facility] around 2 PM on 3/7.  Expected Discharge Plan: Skilled Nursing Facility Barriers to Discharge: Continued Medical Work up   Patient Goals and CMS Choice            Expected Discharge Plan and Services       Living arrangements for the past 2 months: Skilled Nursing Facility                                      Prior Living Arrangements/Services Living arrangements for the past 2 months: Skilled Nursing Facility Lives with:: Facility Resident                   Activities of Daily Living      Permission Sought/Granted                  Emotional Assessment              Admission diagnosis:  Acute on chronic diastolic CHF (congestive heart failure) (HCC) [I50.33] Patient Active Problem List   Diagnosis Date Noted   Advance care planning 05/07/2023   Multifocal pneumonia 04/30/2023   COPD exacerbation (HCC) 04/29/2023   Acute on chronic diastolic CHF (congestive heart failure) (HCC) 04/29/2023   Acute on chronic respiratory failure with hypoxia (HCC) 04/29/2023   HLD (hyperlipidemia) 04/29/2023   Abdominal distension 04/29/2023   Acute metabolic encephalopathy 04/29/2023   Pneumonia of right lower lobe due to infectious organism 03/12/2023   Ambulatory dysfunction 03/12/2023   Dehydration 12/07/2022    Paroxysmal atrial fibrillation with rapid ventricular response (HCC) 12/07/2022   Constipation 12/07/2022   Thoracic spondylosis with cord compression (T10) 09/25/2022   Hx of thoracic laminectomy (T11-12) 09/25/2022   Metabolic acidosis 09/15/2022   Aspiration pneumonia of right lower lobe (HCC) 09/15/2022   Complicated UTI (urinary tract infection) 09/14/2022   Hypothyroidism 09/14/2022   Depression with anxiety 09/14/2022   Obesity (BMI 30-39.9) 09/14/2022   Prolonged QT interval 09/14/2022   Suprapubic catheter (HCC) 09/14/2022   Abnormal MRI, thoracic spine (08/23/2022) 09/03/2022   DDD (degenerative disc disease), cervical 08/05/2022   Neurogenic incontinence 08/05/2022   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) 08/05/2022   Foraminal stenosis of thoracic region 08/05/2022   Thoracic Facet Arthropathy 08/05/2022   Lower extremity numbness (Bilateral) 08/05/2022   Weakness of lower extremity (Bilateral) 08/05/2022   Chronic midline low back pain with left-sided sciatica 08/05/2022   Left foot drop 08/05/2022   Lumbar postlaminectomy syndrome 08/05/2022   Poor memory 08/05/2022   Vitamin B12 deficiency 07/25/2022   Pharmacologic therapy 07/24/2022   Disorder of skeletal system 07/24/2022   Problems influencing health status  07/24/2022   Elevated sed rate 07/24/2022   Elevated C-reactive protein (CRP) 07/24/2022   Chronic anticoagulation (ELIQUIS) 07/24/2022   Chronic neck and back pain (1ry area of Pain) (Bilateral) (L>R) 07/24/2022   Abnormal MRI, cervical spine (08/23/2022) 07/24/2022   History of cervical spinal surgery 07/24/2022   History of lumbar surgery 07/24/2022   History of thoracic surgery 07/24/2022   Laryngopharyngeal reflux 06/17/2022   Throat clearing 06/17/2022   Acute respiratory failure (HCC) 04/03/2021   E. coli UTI    Herpes zoster conjunctivitis of eye (Left)    Severe sepsis (HCC) 03/16/2021   Obstipation 01/30/2021   Anxiety 01/30/2021    Iron deficiency anemia    Edema leg    Respiratory failure (HCC) 01/09/2021   Ileus (HCC)    Neck pain    AKI (acute kidney injury) (HCC)    Type 2 diabetes mellitus with peripheral neuropathy (HCC)    Parkinson disease (HCC)    Status post implantation of mitral valve leaflet clip 06/27/2020   Chronic respiratory failure with hypoxia (HCC) 01/10/2020   Impaired functional mobility, balance, gait, and endurance 09/15/2019   Catheter cystitis (HCC) 10/16/2018   Urinary retention 07/14/2018   COVID-19 virus detected 07/03/2018   Benign prostatic hyperplasia with lower urinary tract symptoms 08/06/2017   Osteoarthritis of right knee 06/19/2017   S/P orthopedic surgery, follow-up exam 10/25/2016   Tendon rupture of wrist, sequela 01/02/2016   Rotator cuff tear arthropathy of right shoulder 09/28/2015   Acute pain of right knee 08/03/2015   Chronic knee pain (Right) 08/03/2015   Calcific tendinitis of left shoulder 07/05/2015   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/05/2015   Primary osteoarthritis of left shoulder 07/05/2015   Gait difficulty 06/26/2015   Severe mitral regurgitation 06/05/2015   AF (paroxysmal atrial fibrillation) (HCC) 05/31/2015   Chronic pain syndrome 05/31/2015   COPD with acute exacerbation (HCC) 05/31/2015   Essential hypertension 05/31/2015   Atrial fibrillation (HCC) 05/31/2015   Chronic wrist pain (Right) 04/24/2015   Arthritis of left wrist 04/24/2015   Rupture of extensor tendon of left hand 04/24/2015   PCP:  Pearson Grippe, MD Pharmacy:   [facility] - [place], [place] - [address] [place] [address] Phone: [phone] Fax: [phone]  [facility] - [place], [place] - [address] [place] [address] Phone: [phone] Fax: [phone]     Social Drivers of Health (SDOH) Social History: SDOH Screenings    Food Insecurity: No Food Insecurity (05/01/2023)  Housing: Unknown (05/02/2023)  Transportation Needs: No Transportation Needs (05/02/2023)  Utilities: Not At Risk (05/01/2023)  Depression (PHQ2-9): Low Risk  (09/25/2022)  Financial Resource Strain: Low Risk  (05/02/2023)  Social Connections: Unknown (05/01/2023)  Tobacco Use: Medium Risk (06/06/2023)   SDOH Interventions:     Readmission Risk Interventions    03/12/2023    2:19 PM 01/12/2021    1:08 PM 10/20/2020    8:59 AM  Readmission Risk Prevention Plan  Transportation Screening Complete Complete Complete  PCP or Specialist Appt within 3-5 Days Complete  Complete  HRI or Home Care Consult   Complete  Social Work Consult for Recovery Care Planning/Counseling Complete    Palliative Care Screening Not Applicable  Not Applicable  Medication Review (RN Care Manager) Complete Complete Referral to Pharmacy  PCP or Specialist appointment within 3-5 days of discharge  Complete   HRI or Home Care Consult  Complete  SW Recovery Care/Counseling Consult  Complete   Palliative Care Screening  Not Applicable   Skilled Nursing Facility  Complete

## 2023-06-07 NOTE — ED Notes (Signed)
 This NT changed patient. Patient was wet. Patient has on clean gown, clean chux, clean brief and clean blankets.

## 2023-06-08 DIAGNOSIS — I5033 Acute on chronic diastolic (congestive) heart failure: Secondary | ICD-10-CM | POA: Diagnosis not present

## 2023-06-08 LAB — CBC
HCT: 33.8 % — ABNORMAL LOW (ref 39.0–52.0)
Hemoglobin: 11.2 g/dL — ABNORMAL LOW (ref 13.0–17.0)
MCH: 30 pg (ref 26.0–34.0)
MCHC: 33.1 g/dL (ref 30.0–36.0)
MCV: 90.6 fL (ref 80.0–100.0)
Platelets: 241 10*3/uL (ref 150–400)
RBC: 3.73 MIL/uL — ABNORMAL LOW (ref 4.22–5.81)
RDW: 18.6 % — ABNORMAL HIGH (ref 11.5–15.5)
WBC: 9.5 10*3/uL (ref 4.0–10.5)
nRBC: 0 % (ref 0.0–0.2)

## 2023-06-08 LAB — GLUCOSE, CAPILLARY
Glucose-Capillary: 112 mg/dL — ABNORMAL HIGH (ref 70–99)
Glucose-Capillary: 129 mg/dL — ABNORMAL HIGH (ref 70–99)

## 2023-06-08 LAB — BASIC METABOLIC PANEL
Anion gap: 7 (ref 5–15)
BUN: 23 mg/dL (ref 8–23)
CO2: 28 mmol/L (ref 22–32)
Calcium: 8.8 mg/dL — ABNORMAL LOW (ref 8.9–10.3)
Chloride: 101 mmol/L (ref 98–111)
Creatinine, Ser: 0.97 mg/dL (ref 0.61–1.24)
GFR, Estimated: >60 mL/min (ref >60)
Glucose, Bld: 111 mg/dL — ABNORMAL HIGH (ref 70–99)
Potassium: 3.4 mmol/L — ABNORMAL LOW (ref 3.5–5.1)
Sodium: 136 mmol/L (ref 135–145)

## 2023-06-08 LAB — CBG MONITORING, ED
Glucose-Capillary: 112 mg/dL — ABNORMAL HIGH (ref 70–99)
Glucose-Capillary: 129 mg/dL — ABNORMAL HIGH (ref 70–99)

## 2023-06-08 MED ORDER — POLYETHYLENE GLYCOL 3350 17 G PO PACK
17.0000 g | PACK | Freq: Every day | ORAL | Status: DC
  Administered 2023-06-08 – 2023-06-09 (×2): 17 g via ORAL
  Filled 2023-06-08 (×3): qty 1

## 2023-06-08 MED ORDER — DILTIAZEM HCL ER COATED BEADS 120 MG PO CP24
240.0000 mg | ORAL_CAPSULE | Freq: Every day | ORAL | Status: DC
  Administered 2023-06-08 – 2023-06-13 (×6): 240 mg via ORAL
  Filled 2023-06-08: qty 2
  Filled 2023-06-08: qty 1
  Filled 2023-06-08 (×4): qty 2

## 2023-06-08 MED ORDER — METOPROLOL TARTRATE 25 MG PO TABS
12.5000 mg | ORAL_TABLET | Freq: Two times a day (BID) | ORAL | Status: DC
  Administered 2023-06-08 – 2023-06-13 (×11): 12.5 mg via ORAL
  Filled 2023-06-08 (×11): qty 1

## 2023-06-08 MED ORDER — SENNOSIDES-DOCUSATE SODIUM 8.6-50 MG PO TABS
1.0000 | ORAL_TABLET | Freq: Two times a day (BID) | ORAL | Status: DC
  Administered 2023-06-08 – 2023-06-13 (×4): 1 via ORAL
  Filled 2023-06-08 (×8): qty 1

## 2023-06-08 MED ORDER — HYDROCORTISONE (PERIANAL) 2.5 % EX CREA
1.0000 | TOPICAL_CREAM | Freq: Four times a day (QID) | CUTANEOUS | Status: DC | PRN
  Filled 2023-06-08 (×4): qty 28.35

## 2023-06-08 MED ORDER — HYDROCORTISONE 1 % EX CREA
1.0000 | TOPICAL_CREAM | Freq: Three times a day (TID) | CUTANEOUS | Status: DC | PRN
  Administered 2023-06-08: 1 via TOPICAL
  Filled 2023-06-08: qty 28

## 2023-06-08 NOTE — ED Notes (Signed)
 Pt did not receive breakfast tray. This tech attempted to call dining services, was put on hold and then dining hung up.

## 2023-06-08 NOTE — Progress Notes (Signed)
 Patient reports that he is missing his dark framed glasses and Iphone with case. Notified charge nurse. Patient moved to his current location of ED 39 from ED 24. Both rooms checked and no belongings found. Notified patient advocate Lurena Joiner. She was unable to find them either. Patient states he is sure he brought them. I called [facility] and spoke with staff. They confirmed both items his glasses and his cell phone are in his room at the facility on his table. Patient has already left the ED. I will have floor staff notify patient.

## 2023-06-08 NOTE — Plan of Care (Signed)

## 2023-06-08 NOTE — Progress Notes (Signed)
 Progress Note   Patient: Raymond Hays MWU:[id number] DOB: 10/13/1945 DOA: 06/06/2023     2 DOS: the patient was seen and examined on 06/08/2023   Brief hospital course:  HPI on admission 06/06/23: "Coolidge Gossard is a 78 y.o. 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 "... See H&P for full HPI on admission & ED course.  Pt was admitted for further evaluation and management as outlined below. Treated initially with IV Lasix 20 mg x 1 dose. Started on empiric IV antibiotic for complicated UTI pending culture results.  Further hospital course and management as outlined below.   Assessment and Plan:  Acute on chronic diastolic CHF (congestive heart failure) (HCC): pt has shortness breath, leg edema, elevated BNP 320, crackles on auscultation, positive JVD, clinically consistent with CHF exacerbation.   Echo on 09/16/2022 showed EF of 55 to 60%.   U/S doppler of BLE's negative for DVT's Given single dose IV Lasix 20 mg on admission --Further diuresis PRN and as BP tolerates --Started on midodrine 10 mg tid, continue for & wean as BP tolerates --Daily weights and strict I/O's --Low sodium diet and fluid restriction --PRN bronchodilators    Complicated UTI (urinary tract infection) in the setting of suprapubic catheter placement: Suprapubic catheter is changed in ED. --Continue empiric Meropenem --Follow urine culture and blood cultures   Acute metabolic encephalopathy: Likely due to multifactorial etiology, including UTI, hypoxia.  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 --Fall precautions --Neuro checks   Paroxysmal A fib: HR controlled on admission 3/9 -- HR's elevated off  Cardizem and metop --Resume home Cardizem and metoprolol at reduced dose due to soft BP's --Continue Eliquis --As needed IV metoprolol for now   Essential hypertension -IV hydralazine as needed -Hold Cardizem and metoprolol due to soft blood pressure   Parkinson disease  --Sinemet --Fall precautions  Type 2 diabetes mellitus with peripheral neuropathy (HCC): Recent A1c 7.4, poorly controlled.  Patient is taking glargine insulin 10 units daily. --Glargine insulin 7 units daily --Sliding scale insulin   Hypothyroidism --Continue Synthroid   HLD (hyperlipidemia) --Continue Lipitor   Depression with anxiety --Continue home medications   Obesity (BMI 30-39.9) Body mass index is 33.2 kg/m. Complicates overall care and prognosis.  Recommend lifestyle modifications including physical activity and diet for weight loss and overall long-term health.          Subjective: Pt seen in the ED still holding for a bed this AM. He reports not knowing why he is here or what's going on.  Missing his glasses and his phone.  Denies acute medical complaints other than constipation.  Waiting for breakfast (nearly noon), unclear if it was brought earlier or not.  He reports some confusion and feeling disoriented without window in the room.   Physical Exam: Vitals:   06/08/23 0900 06/08/23 0942 06/08/23 1000 06/08/23 1100  BP: 109/70 123/73 100/73 109/70  Pulse: (!) 110 (!) 126 (!) 121 (!) 124  Resp: 20 17 12 19   Temp:  98.8 F (37.1 C)    TempSrc:  Oral    SpO2: 100% 100% 100% 98%  Weight:      Height:       General exam: awake, alert, no acute distress HEENT: moist mucus membranes, hearing  grossly normal  Respiratory system: CTAB, on 2 L O2 [place], normal respiratory effort. Cardiovascular system: normal S1/S2, RRR Gastrointestinal system: distended, non-tender, hypoactive bowel sounds Genitourinary system: suprapubic catheter in place Central nervous system: A&O x3. no gross focal  neurologic deficits, normal speech Extremities: stable R>L lower extremity edema and BLE mild erythema without any differential warmth on palpation Psychiatry: normal mood, congruent affect   Data Reviewed:  Notable labs --   BMP with K 3.4, Glucose 111, Cr 1.25 >> 0.97, Ca 8.8 CBC with WBC normalized 23.8 >> 15.1 >> 9.3, Hbg 10.1 >> 11.2 CBG's at goal  Family Communication: Updated sister by phone this afternoon.  Disposition: Status is: Inpatient Remains inpatient appropriate because: remains on IV antibiotic pending culture results   Planned Discharge Destination: return to prior facility     Time spent: 38 minutes  Author: Pennie Banter, DO 06/08/2023 11:54 AM  For on call review [email or web address].

## 2023-06-08 NOTE — ED Notes (Signed)
 This tech assisted pt to restroom with a wheelchair.

## 2023-06-09 ENCOUNTER — Other Ambulatory Visit: Payer: Self-pay | Admitting: Family

## 2023-06-09 DIAGNOSIS — I5033 Acute on chronic diastolic (congestive) heart failure: Secondary | ICD-10-CM | POA: Diagnosis not present

## 2023-06-09 DIAGNOSIS — I5032 Chronic diastolic (congestive) heart failure: Secondary | ICD-10-CM

## 2023-06-09 LAB — BASIC METABOLIC PANEL
Anion gap: 8 (ref 5–15)
BUN: 18 mg/dL (ref 8–23)
CO2: 29 mmol/L (ref 22–32)
Calcium: 8.9 mg/dL (ref 8.9–10.3)
Chloride: 99 mmol/L (ref 98–111)
Creatinine, Ser: 0.89 mg/dL (ref 0.61–1.24)
GFR, Estimated: >60 mL/min (ref >60)
Glucose, Bld: 112 mg/dL — ABNORMAL HIGH (ref 70–99)
Potassium: 3.4 mmol/L — ABNORMAL LOW (ref 3.5–5.1)
Sodium: 136 mmol/L (ref 135–145)

## 2023-06-09 LAB — URINE CULTURE: Culture: >=100000 — AB

## 2023-06-09 LAB — GLUCOSE, CAPILLARY
Glucose-Capillary: 91 mg/dL (ref 70–99)
Glucose-Capillary: 131 mg/dL — ABNORMAL HIGH (ref 70–99)
Glucose-Capillary: 165 mg/dL — ABNORMAL HIGH (ref 70–99)
Glucose-Capillary: 156 mg/dL — ABNORMAL HIGH (ref 70–99)

## 2023-06-09 LAB — MRSA NEXT GEN BY PCR, NASAL: MRSA by PCR Next Gen: NOT DETECTED

## 2023-06-09 LAB — MAGNESIUM: Magnesium: 2.1 mg/dL (ref 1.7–2.4)

## 2023-06-09 MED ORDER — RISAQUAD PO CAPS
1.0000 | ORAL_CAPSULE | Freq: Every day | ORAL | Status: DC
  Administered 2023-06-09 – 2023-06-13 (×5): 1 via ORAL
  Filled 2023-06-09 (×5): qty 1

## 2023-06-09 MED ORDER — CALCIUM CARBONATE ANTACID 500 MG PO CHEW: 2.0000 | CHEWABLE_TABLET | Freq: Every day | ORAL | Status: DC | PRN

## 2023-06-09 MED ORDER — POTASSIUM CHLORIDE CRYS ER 20 MEQ PO TBCR
40.0000 meq | EXTENDED_RELEASE_TABLET | Freq: Once | ORAL | Status: AC
  Administered 2023-06-09: 40 meq via ORAL
  Filled 2023-06-09: qty 2

## 2023-06-09 MED ORDER — CIPROFLOXACIN HCL 500 MG PO TABS
500.0000 mg | ORAL_TABLET | Freq: Two times a day (BID) | ORAL | Status: DC
  Filled 2023-06-09: qty 1

## 2023-06-09 MED ORDER — SALINE SPRAY 0.65 % NA SOLN: 2.0000 | Freq: Four times a day (QID) | NASAL | Status: DC | PRN

## 2023-06-09 MED ORDER — MIDODRINE HCL 5 MG PO TABS
5.0000 mg | ORAL_TABLET | Freq: Three times a day (TID) | ORAL | Status: DC
  Filled 2023-06-09: qty 1

## 2023-06-09 MED ORDER — LOPERAMIDE HCL 2 MG PO CAPS
2.0000 mg | ORAL_CAPSULE | ORAL | Status: DC | PRN
  Administered 2023-06-09: 2 mg via ORAL
  Filled 2023-06-09: qty 1

## 2023-06-09 MED ORDER — CAMPHOR-MENTHOL 0.5-0.5 % EX LOTN
1.0000 | TOPICAL_LOTION | Freq: Three times a day (TID) | CUTANEOUS | Status: DC
  Administered 2023-06-11 – 2023-06-13 (×7): 1 via TOPICAL
  Filled 2023-06-09: qty 222

## 2023-06-09 MED ORDER — VITAMIN C 500 MG PO TABS
500.0000 mg | ORAL_TABLET | Freq: Every day | ORAL | Status: DC
Start: 2023-06-09 — End: 2023-06-13
  Administered 2023-06-09 – 2023-06-13 (×5): 500 mg via ORAL
  Filled 2023-06-09 (×5): qty 1

## 2023-06-09 MED ORDER — PSYLLIUM 95 % PO PACK
1.0000 | PACK | Freq: Three times a day (TID) | ORAL | Status: DC
  Administered 2023-06-09 – 2023-06-13 (×8): 1 via ORAL
  Filled 2023-06-09 (×13): qty 1

## 2023-06-09 MED ORDER — FLUTICASONE PROPIONATE 50 MCG/ACT NA SUSP: 2.0000 | Freq: Every day | NASAL | Status: DC | PRN

## 2023-06-09 MED ORDER — TACROLIMUS 0.03 % EX OINT
1.0000 | TOPICAL_OINTMENT | Freq: Every day | CUTANEOUS | Status: DC
Start: 2023-06-09 — End: 2023-06-10

## 2023-06-09 MED ORDER — FLUTICASONE FUROATE-VILANTEROL 100-25 MCG/ACT IN AEPB
1.0000 | INHALATION_SPRAY | Freq: Every day | RESPIRATORY_TRACT | Status: DC
  Administered 2023-06-11 – 2023-06-13 (×3): 1 via RESPIRATORY_TRACT
  Filled 2023-06-09 (×2): qty 28

## 2023-06-09 MED ORDER — CIPROFLOXACIN HCL 500 MG PO TABS
500.0000 mg | ORAL_TABLET | Freq: Two times a day (BID) | ORAL | Status: AC
  Administered 2023-06-09 – 2023-06-12 (×6): 500 mg via ORAL
  Filled 2023-06-09 (×6): qty 1

## 2023-06-09 MED ORDER — IPRATROPIUM BROMIDE 0.06 % NA SOLN
2.0000 | Freq: Three times a day (TID) | NASAL | Status: DC
  Administered 2023-06-11: 2 via NASAL
  Filled 2023-06-09: qty 15

## 2023-06-09 MED ORDER — ENSURE ENLIVE PO LIQD
237.0000 mL | Freq: Two times a day (BID) | ORAL | Status: DC
  Administered 2023-06-09 – 2023-06-12 (×5): 237 mL via ORAL

## 2023-06-09 MED ORDER — POLYVINYL ALCOHOL 1.4 % OP SOLN
2.0000 [drp] | Freq: Three times a day (TID) | OPHTHALMIC | Status: DC
  Filled 2023-06-09: qty 15

## 2023-06-09 MED ORDER — HYDROCORTISONE 1 % EX CREA
TOPICAL_CREAM | Freq: Three times a day (TID) | CUTANEOUS | Status: DC
  Filled 2023-06-09: qty 28

## 2023-06-09 MED ORDER — CIPROFLOXACIN HCL 500 MG PO TABS
750.0000 mg | ORAL_TABLET | Freq: Two times a day (BID) | ORAL | Status: DC
  Administered 2023-06-09: 750 mg via ORAL

## 2023-06-09 MED ORDER — VITAMIN D 25 MCG (1000 UNIT) PO TABS
2000.0000 [IU] | ORAL_TABLET | Freq: Every day | ORAL | Status: DC
  Administered 2023-06-09 – 2023-06-13 (×5): 2000 [IU] via ORAL
  Filled 2023-06-09 (×5): qty 2

## 2023-06-09 MED ORDER — METHOCARBAMOL 500 MG PO TABS
1000.0000 mg | ORAL_TABLET | Freq: Two times a day (BID) | ORAL | Status: DC
Start: 2023-06-09 — End: 2023-06-13
  Administered 2023-06-09 – 2023-06-13 (×9): 1000 mg via ORAL
  Filled 2023-06-09 (×9): qty 2

## 2023-06-09 MED ORDER — UMECLIDINIUM BROMIDE 62.5 MCG/ACT IN AEPB
1.0000 | INHALATION_SPRAY | Freq: Every day | RESPIRATORY_TRACT | Status: DC
  Administered 2023-06-11 – 2023-06-13 (×3): 1 via RESPIRATORY_TRACT
  Filled 2023-06-09 (×2): qty 7

## 2023-06-09 MED ORDER — LACTASE 3000 UNITS PO TABS
3000.0000 [IU] | ORAL_TABLET | Freq: Three times a day (TID) | ORAL | Status: DC
  Administered 2023-06-09 – 2023-06-13 (×11): 3000 [IU] via ORAL
  Filled 2023-06-09 (×13): qty 1

## 2023-06-09 NOTE — Progress Notes (Signed)
 Dr. Denton Lank text paged regarding pt's suprapubic catheter leaking excessively throughout night shift. Page promptly returned and provider endorsed she would address it when evaluating pt today.

## 2023-06-09 NOTE — Progress Notes (Signed)
-  Urology-  Asked to help troubleshoot leaking around SP tube.  Apparently leaking around the catheter overnight and causing skin breakdown.  Reached out to daytime nurse, catheter is in fact draining.  I asked her to flush the catheter which she was able to do and placed a new dressing.  She will let us know if it continues to leak.  If it does, it is likely that he is just having bladder spasms and will treat with either anticholinergic or beta 3 agonist.  Vanna Scotland, MD

## 2023-06-09 NOTE — Plan of Care (Signed)

## 2023-06-09 NOTE — Progress Notes (Signed)
 Education Assessment and Provision:  Detailed education reviewed and instructions provided on heart failure disease management including the following with this patient:  Signs and symptoms of Heart Failure When to call the physician Importance of daily weights Low sodium diet Fluid restriction Medication management Anticipated future follow-up appointments-Patient is a [facility] patient and can't be scheduled for Heart Failure TOC without approval at this time.  Patient education given on each of the above topics.  Patient acknowledges understanding via teach back method and acceptance of all instructions.  Education Materials:  "Living Better With Heart Failure" Booklet, HF zone tool, & Daily Weight Tracker Tool.  Patient has scale at home: Patient is at a SNF Patient has pill box at home: Medications provided to patient at [facility]  Navigator will sign off at this time.  Roxy Horseman, RN, BSN [facility] Heart Failure Navigator Secure Chat Only

## 2023-06-09 NOTE — Progress Notes (Signed)
 OT Cancellation Note  Patient Details Name: Raymond Hays MRN: [id number] DOB: 30-Oct-1945   Cancelled Treatment:    Reason Eval/Treat Not Completed: OT screened, no needs identified, will sign off. Orders received, chart reviewed. This author spoke to pt and confirmed he is at his baseline (TOTAL assist for all ADLs except for self-feeding and requires +2 lift assist for transfers). Pt plans to return to long-term care facility at hospital discharge. No skilled OT needs, will complete orders. Please re-consult if new acute OT needs arise.   Arieona Swaggerty L. Meah Jiron, OTR/L  06/09/23, 11:01 AM

## 2023-06-09 NOTE — Consult Note (Signed)
 Pharmacy Antibiotic Note  Raymond Hays is a 78 y.o. male admitted on 06/06/2023 with  complicated UTI.  Pharmacy has been consulted for Meropenem and Cipro dosing.  -HX ESBL UTI, suprapubic Foley catheter, neurogenic bladder . From SNF. Suprapubic catheter was changed in ED  - Urine cx : + PsA:  R to imipenem                   + Kleb pneumo: ESBL, ONLY sens to imipenem, Zosyn(MIC 16)   Plan: - Day 3  Continue Meropenem 1g IV Q8 hours   Crcl 67 ml/min  -start Cipro 750 mg po BID (watch w/ Ferrous sulfate po) for Pseudomonas coverage (resistant to Imipenem)   -follow renal fxn, length of therapy, etc   Height: 5\' 7"  (170.2 cm) Weight: 93.6 kg (206 lb 5.6 oz) IBW/kg (Calculated) : 66.1  Temp (24hrs), Avg:98.4 F (36.9 C), Min:97.7 F (36.5 C), Max:98.8 F (37.1 C)  Recent Labs  Lab 06/06/23 1414 06/06/23 1759 06/07/23 0531 06/08/23 0834  WBC 23.8*  --  15.1* 9.5  CREATININE 1.23  --  1.25* 0.97  LATICACIDVEN 1.0 1.4  --   --     Estimated Creatinine Clearance: 67.3 mL/min (by C-G formula based on SCr of 0.97 mg/dL).    No Known Allergies  Antimicrobials this admission: Ciprofloxacin 3/10 >> Meropenem 3/7 (evening) >>  Azithro/rocephin 3/7 x 1  Dose adjustments this admission: N/A  Microbiology results: 3/7 BCx: NG x 2 d 3/7 UCx: + PsA:  R to imipenem                   + Kleb pneumo: ESBL, ONLY sens to imipenem, Zosyn(MIC 16) 3/10 MRSA PCR neg   Thank you for allowing pharmacy to be a part of this patient's care.  Bari Mantis PharmD Clinical Pharmacist 06/09/2023

## 2023-06-09 NOTE — Progress Notes (Signed)
 Progress Note   Patient: Raymond Hays ZOX:[id number] DOB: [birth year] DOA: 06/06/2023     3 DOS: the patient was seen and examined on 06/09/2023   Brief hospital course:  HPI on admission 06/06/23: "Raymond Hays is a 78 y.o. 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 "... See H&P for full HPI on admission & ED course.  Pt was admitted for further evaluation and management as outlined below. Treated initially with IV Lasix 20 mg x 1 dose. Started on empiric IV antibiotic for complicated UTI pending culture results.  Further hospital course and management as outlined below.   Assessment and Plan:  Acute on chronic diastolic CHF (congestive heart failure) (HCC): pt has shortness breath, leg edema, elevated BNP 320, crackles on auscultation, positive JVD, clinically consistent with CHF exacerbation.   Echo on 09/16/2022 showed EF of 55 to 60%.   U/S doppler of BLE's negative for DVT's Given single dose IV Lasix 20 mg on admission --Further diuresis PRN and as BP tolerates --Started on midodrine 10 mg TID on admission  -- Reduce midodrine to 5 mg TID & wean off as BP tolerates --Daily weights and strict I/O's --Low sodium diet and fluid restriction --PRN bronchodilators    Complicated UTI (urinary tract infection) in the setting of suprapubic catheter placement: Suprapubic catheter is changed in ED. Urine culture grew Pseudomonas and multi-resistant Klebsiella ? Colonlization, but clinically suspect true UTI contributed most to his presentation --Start PO Cipro to cover Pseudomonas --Continue Meropenem to cover multi-drug resistant Klebsiella --Follow urine culture and blood cultures   Acute metabolic  encephalopathy: Likely due to multifactorial etiology, including UTI, hypoxia.  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 --Fall precautions --Neuro checks   Paroxysmal A fib: HR controlled on admission 3/9 -- HR's elevated off Cardizem and metop 3/10 -- HR's controlled  --Continue home Cardizem and metoprolol at reduced dose due to soft BP's --Continue Eliquis --As needed IV metoprolol for now   Essential hypertension -IV hydralazine as needed -Hold Cardizem and metoprolol due to soft blood pressure   Parkinson disease  --Sinemet --Fall precautions --Resides at nursing facility  Type 2 diabetes mellitus with peripheral neuropathy [facility]): Recent A1c 7.4, poorly controlled.  Patient is taking glargine insulin 10 units daily. --Glargine insulin 7 units daily --Sliding scale insulin   Hypothyroidism --Continue Synthroid   HLD (hyperlipidemia) --Continue Lipitor   Depression with anxiety --Continue home medications   Obesity (BMI 30-39.9) Body mass index is 32.32 kg/m. Complicates overall care and prognosis.  Recommend lifestyle modifications including physical activity and diet for weight loss and overall long-term health.          Subjective: Pt seen awake resting in bed this AM.  Reports having diarrhea and asks for something to stop it.  He otherwise reports feeling better and denies complaints.     Physical Exam: Vitals:   06/08/23 2240 06/09/23 0444 06/09/23 0500 06/09/23 0924  BP: 129/86 106/71  110/69  Pulse: 100 94  96  Resp:  20  11  Temp:  98.8 F (37.1 C)  97.7 F (36.5 C)  TempSrc:  Oral  Oral  SpO2:  100%  100%  Weight:   93.6 kg   Height:       General  exam: awake, alert, no acute distress HEENT: moist mucus membranes, hearing grossly normal  Respiratory system: CTAB, on 2.5 L O2 [place], normal respiratory effort. Cardiovascular system: normal S1/S2, RRR Gastrointestinal system: distended, non-tender, hypoactive  bowel sounds Genitourinary system: suprapubic catheter in place Central nervous system: A&O x3. no gross focal neurologic deficits, normal speech Extremities: stable R>L lower extremity edema and BLE mild erythema without any differential warmth on palpation Skin: dry, intact, erythema and scaling of face, head, neck Psychiatry: normal mood, congruent affect   Data Reviewed:  Notable labs --   BMP with K 3.4, Glucose 112  CBC 3/9 with WBC normalized 23.8 >> 15.1 >> 9.3, Hbg 10.1 >> 11.2  CBG's at goal   Family Communication: Updated sister by phone 3/9. None present on rounds, will  attempt to call as time allows.   Disposition: Status is: Inpatient Remains inpatient appropriate because: remains on IV antibiotic due to multi-drug resistant UTI.      Planned Discharge Destination: return to prior facility     Time spent: 42 minutes  Author: Pennie Banter, DO 06/09/2023 1:42 PM  For on call review [email or web address].

## 2023-06-10 ENCOUNTER — Ambulatory Visit: Payer: No Typology Code available for payment source | Admitting: Neurosurgery

## 2023-06-10 DIAGNOSIS — I5033 Acute on chronic diastolic (congestive) heart failure: Secondary | ICD-10-CM | POA: Diagnosis not present

## 2023-06-10 LAB — BASIC METABOLIC PANEL
Anion gap: 10 (ref 5–15)
BUN: 18 mg/dL (ref 8–23)
CO2: 26 mmol/L (ref 22–32)
Calcium: 8.7 mg/dL — ABNORMAL LOW (ref 8.9–10.3)
Chloride: 102 mmol/L (ref 98–111)
Creatinine, Ser: 0.81 mg/dL (ref 0.61–1.24)
GFR, Estimated: >60 mL/min (ref >60)
Glucose, Bld: 108 mg/dL — ABNORMAL HIGH (ref 70–99)
Potassium: 3.8 mmol/L (ref 3.5–5.1)
Sodium: 138 mmol/L (ref 135–145)

## 2023-06-10 LAB — GLUCOSE, CAPILLARY
Glucose-Capillary: 108 mg/dL — ABNORMAL HIGH (ref 70–99)
Glucose-Capillary: 169 mg/dL — ABNORMAL HIGH (ref 70–99)
Glucose-Capillary: 158 mg/dL — ABNORMAL HIGH (ref 70–99)
Glucose-Capillary: 98 mg/dL (ref 70–99)

## 2023-06-10 MED ORDER — ZINC OXIDE 40 % EX OINT
TOPICAL_OINTMENT | Freq: Two times a day (BID) | CUTANEOUS | Status: DC
  Filled 2023-06-10: qty 113

## 2023-06-10 MED ORDER — MIDODRINE HCL 5 MG PO TABS
2.5000 mg | ORAL_TABLET | Freq: Three times a day (TID) | ORAL | Status: DC
  Administered 2023-06-10 – 2023-06-13 (×10): 2.5 mg via ORAL
  Filled 2023-06-10 (×7): qty 1

## 2023-06-10 NOTE — Consult Note (Signed)
 WOC Nurse Consult Note: urology has been consulted on this patient to address issues of leaking around SP catheter  Reason for Consult: skin breakdown around SP catheter  Wound type: partial thickess skin loss r/t ICD-10 CM Codes for Irritant Dermatitis L24B3 - Related to fecal or urinary stoma or fistula  Pressure Injury POA: NA  Measurement: see nursing flowsheet  Wound bed:  Drainage (amount, consistency, odor) urine  Periwound: Dressing procedure/placement/frequency: Cleanse around suprapubic catheter with Vashe wound cleanser [employer] [place] [phone]), do not rinse and allow to air dry.  Apply a thin layer of Desitin to irritated skin surrounding SP catheter.  May cut a piece of silver hydrofiber (Aquacel AG [employer] [place] [phone]) like a split gauze and put around [facility] catheter/under bumper.  If more absorption is required can try Drawtex (superabsorbent dressing) around SP catheter [employer] #[id number].   POC discussed with bedside nurse. WOC team will not follow. Re-consult if further needs arise.   Thank you,    Priscella Mann MSN, RN-BC, [employer] [phone]

## 2023-06-10 NOTE — Progress Notes (Signed)
 PT Cancellation Note  Patient Details Name: Raymond Hays MRN: [id number] DOB: [birth year]   Cancelled Treatment:    Reason Eval/Treat Not Completed: PT screened, no needs identified, will sign off PT orders received, chart reviewed. Per chart, pt requires total assist +2 & lift to transfer at baseline. Pt does not require acute PT services. PT to complete current orders at this time, please re-consult if new needs arise.  Aleda Grana, PT, DPT 06/10/23, 10:22 AM   Sandi Mariscal 06/10/2023, 10:22 AM

## 2023-06-10 NOTE — TOC Initial Note (Signed)
 Transition of Care [facility]) - Initial/Assessment Note    Patient Details  Name: Raymond Hays MRN: [id number] Date of Birth: 1945/04/10  Transition [facility]) CM/SW Contact:    Chapman Fitch, RN Phone Number: 06/10/2023, 3:51 PM  Clinical Narrative:                 Patient admitted from [facility]  Per Stanton Kidney at [facility] patient will require auth through [place].  Message sent to MD to determine when patient anticipated medically ready Will need Fl2  Expected Discharge Plan: Skilled Nursing Facility Barriers to Discharge: Continued Medical Work up   Patient Goals and CMS Choice            Expected Discharge Plan and Services       Living arrangements for the past 2 months: Skilled Nursing Facility                                      Prior Living Arrangements/Services Living arrangements for the past 2 months: Skilled Nursing Facility Lives with:: Facility Resident                   Activities of Daily Living   ADL Screening (condition at time of admission) Independently performs ADLs?: No Does the patient have a NEW difficulty with bathing/dressing/toileting/self-feeding that is expected to last >3 days?: Yes (Initiates electronic notice to provider for possible OT consult) Does the patient have a NEW difficulty with getting in/out of bed, walking, or climbing stairs that is expected to last >3 days?: Yes (Initiates electronic notice to provider for possible PT consult) Does the patient have a NEW difficulty with communication that is expected to last >3 days?: No Is the patient deaf or have difficulty hearing?: Yes Does the patient have difficulty seeing, even when wearing glasses/contacts?: Yes Does the patient have difficulty concentrating, remembering, or making decisions?: No  Permission Sought/Granted                  Emotional Assessment              Admission diagnosis:  Shortness of breath [R06.02] Hypoxia  [R09.02] Complicated UTI (urinary tract infection) [N39.0] Acute on chronic diastolic CHF (congestive heart failure) (HCC) [I50.33] Patient Active Problem List   Diagnosis Date Noted   Advance care planning 05/07/2023   Multifocal pneumonia 04/30/2023   COPD exacerbation (HCC) 04/29/2023   Acute on chronic diastolic CHF (congestive heart failure) (HCC) 04/29/2023   Acute on chronic respiratory failure with hypoxia (HCC) 04/29/2023   HLD (hyperlipidemia) 04/29/2023   Abdominal distension 04/29/2023   Acute metabolic encephalopathy 04/29/2023   Pneumonia of right lower lobe due to infectious organism 03/12/2023   Ambulatory dysfunction 03/12/2023   Dehydration 12/07/2022   Paroxysmal atrial fibrillation with rapid ventricular response (HCC) 12/07/2022   Constipation 12/07/2022   Thoracic spondylosis with cord compression (T10) 09/25/2022   Hx of thoracic laminectomy (T11-12) 09/25/2022   Metabolic acidosis 09/15/2022   Aspiration pneumonia of right lower lobe (HCC) 09/15/2022   Complicated UTI (urinary tract infection) 09/14/2022   Hypothyroidism 09/14/2022   Depression with anxiety 09/14/2022   Obesity (BMI 30-39.9) 09/14/2022   Prolonged QT interval 09/14/2022   Suprapubic catheter (HCC) 09/14/2022   Abnormal MRI, thoracic spine (08/23/2022) 09/03/2022   DDD (degenerative disc disease), cervical 08/05/2022   Neurogenic incontinence 08/05/2022   Thoracic central spinal stenosis (  SEVERE: T10-11) 08/05/2022   Foraminal stenosis of thoracic region 08/05/2022   Thoracic Facet Arthropathy 08/05/2022   Lower extremity numbness (Bilateral) 08/05/2022   Weakness of lower extremity (Bilateral) 08/05/2022   Chronic midline low back pain with left-sided sciatica 08/05/2022   Left foot drop 08/05/2022   Lumbar postlaminectomy syndrome 08/05/2022   Poor memory 08/05/2022   Vitamin B12 deficiency 07/25/2022   Pharmacologic therapy 07/24/2022   Disorder of skeletal system 07/24/2022    Problems influencing health status 07/24/2022   Elevated sed rate 07/24/2022   Elevated C-reactive protein (CRP) 07/24/2022   Chronic anticoagulation (ELIQUIS) 07/24/2022   Chronic neck and back pain (1ry area of Pain) (Bilateral) (L>R) 07/24/2022   Abnormal MRI, cervical spine (08/23/2022) 07/24/2022   History of cervical spinal surgery 07/24/2022   History of lumbar surgery 07/24/2022   History of thoracic surgery 07/24/2022   Laryngopharyngeal reflux 06/17/2022   Throat clearing 06/17/2022   Acute respiratory failure (HCC) 04/03/2021   E. 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   Chronic respiratory failure with hypoxia (HCC) 01/10/2020   Impaired functional mobility, balance, gait, and endurance 09/15/2019   Catheter cystitis (HCC) 10/16/2018   Urinary retention 07/14/2018   COVID-19 virus detected 07/03/2018   Benign prostatic hyperplasia with lower urinary tract symptoms 08/06/2017   Osteoarthritis of right knee 06/19/2017   S/P orthopedic surgery, follow-up exam 10/25/2016   Tendon rupture of wrist, sequela 01/02/2016   Rotator cuff tear arthropathy of right shoulder 09/28/2015   Acute pain of right knee 08/03/2015   Chronic knee pain (Right) 08/03/2015   Calcific tendinitis of left shoulder 07/05/2015   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/05/2015   Primary osteoarthritis of left shoulder 07/05/2015   Gait difficulty 06/26/2015   Severe mitral regurgitation 06/05/2015   AF (paroxysmal atrial fibrillation) (HCC) 05/31/2015   Chronic pain syndrome 05/31/2015   COPD with acute  exacerbation (HCC) 05/31/2015   Essential hypertension 05/31/2015   Atrial fibrillation (HCC) 05/31/2015   Chronic wrist pain (Right) 04/24/2015   Arthritis of left wrist 04/24/2015   Rupture of extensor tendon of left hand 04/24/2015   PCP:  Pearson Grippe, MD Pharmacy:   [facility] - [place], [place] - [address] [place] [address] Phone: [phone] Fax: [phone]  [facility] - [place], [place] - [address] [place] [address] Phone: [phone] Fax: [phone]     Social Drivers of Health (SDOH) Social History: SDOH Screenings   Food Insecurity: No Food Insecurity (06/08/2023)  Housing: Unknown (06/08/2023)  Transportation Needs: No Transportation Needs (06/08/2023)  Utilities: Not At Risk (06/08/2023)  Depression (PHQ2-9): Low Risk  (09/25/2022)  Financial Resource Strain: Low Risk  (05/02/2023)  Social Connections: Unknown (06/08/2023)  Tobacco Use: Medium Risk (06/06/2023)   SDOH Interventions:     Readmission Risk Interventions    03/12/2023    2:19 PM 01/12/2021    1:08 PM 10/20/2020    8:59 AM  Readmission Risk Prevention Plan  Transportation Screening  Complete Complete  PCP or Specialist Appt within 3-5 Days   Complete  HRI or Home Care Consult   Complete  Social Work Consult for Recovery Care Planning/Counseling     Palliative Care Screening   Not Applicable  Medication Review [employer])  Complete Referral to Pharmacy  PCP or Specialist appointment within 3-5 days of discharge  Complete   HRI or Home Care Consult  Complete   SW Recovery Care/Counseling Consult  Complete   Palliative Care Screening  Not Applicable   Skilled Nursing Facility  Complete      Information is confidential and restricted. Go to Review Flowsheets to unlock data.

## 2023-06-10 NOTE — Plan of Care (Signed)
  Problem: Coping: Goal: Ability to adjust to condition or change in health will improve Outcome: Progressing   Problem: Fluid Volume: Goal: Ability to maintain a balanced intake and output will improve Outcome: Progressing   Problem: Metabolic: Goal: Ability to maintain appropriate glucose levels will improve Outcome: Progressing   Problem: Nutritional: Goal: Maintenance of adequate nutrition will improve Outcome: Progressing Goal: Progress toward achieving an optimal weight will improve Outcome: Progressing

## 2023-06-10 NOTE — Progress Notes (Signed)
 Progress Note   Patient: Raymond Hays WUJ:[id number] DOB: [birth year] DOA: 06/06/2023     4 DOS: the patient was seen and examined on 06/10/2023   Brief hospital course:  HPI on admission 06/06/23: "Raymond Hays is a 78 y.o. 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 "... See H&P for full HPI on admission & ED course.  Pt was admitted for further evaluation and management as outlined below. Treated initially with IV Lasix 20 mg x 1 dose. Started on empiric IV antibiotic for complicated UTI pending culture results.  Further hospital course and management as outlined below.   Assessment and Plan:  Acute on chronic diastolic CHF (congestive heart failure) (HCC): pt has shortness breath, leg edema, elevated BNP 320, crackles on auscultation, positive JVD, clinically consistent with CHF exacerbation.   Echo on 09/16/2022 showed EF of 55 to 60%.   U/S doppler of BLE's negative for DVT's Given single dose IV Lasix 20 mg on admission --Further diuresis PRN and as BP tolerates --Started on midodrine 10 mg TID on admission  -- Reduce midodrine to 5 mg TID & wean off as BP tolerates --Daily weights and strict I/O's --Low sodium diet and fluid restriction --PRN bronchodilators    Complicated UTI (urinary tract infection) in the setting of suprapubic catheter placement: Suprapubic catheter is changed in ED. Urine culture grew Pseudomonas and multi-resistant Klebsiella ? Colonlization, but clinically suspect true UTI contributed most to his presentation --PO Cipro to cover Pseudomonas - day 2 --IV meropenem for MDR Klebsiella - day 5 --Follow blood cultures - neg to date   Acute metabolic encephalopathy: Likely due to  multifactorial etiology, including UTI, hypoxia.  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 --Fall precautions --Neuro checks   Paroxysmal A fib: HR controlled on admission 3/9 -- HR's elevated off Cardizem and metop 3/10 -- HR's controlled  --Continue home Cardizem and metoprolol at reduced dose due to soft BP's --Continue Eliquis --As needed IV metoprolol for now   Essential hypertension -IV hydralazine as needed -Hold Cardizem and metoprolol due to soft blood pressure   Parkinson disease  --Sinemet --Fall precautions --Resides at nursing facility  Type 2 diabetes mellitus with peripheral neuropathy [facility]): Recent A1c 7.4, poorly controlled.  Patient is taking glargine insulin 10 units daily. --Glargine insulin 7 units daily --Sliding scale insulin   Hypothyroidism --Continue Synthroid   HLD (hyperlipidemia) --Continue Lipitor   Depression with anxiety --Continue home medications   Obesity (BMI 30-39.9) Body mass index is 32.42 kg/m. Complicates overall care and prognosis.  Recommend lifestyle modifications including physical activity and diet for weight loss and overall long-term health.          Subjective: Pt seen awake resting in bed this AM.  Reports he feels better overall.  No acute complaints.     Physical Exam: Vitals:   06/09/23 2011 06/10/23 0357 06/10/23 0500 06/10/23 0738  BP: (!) 109/93 106/75  128/81  Pulse: 94 99  81  Resp: 18 20  20   Temp: 97.7 F (36.5 C) 98.5 F (36.9 C)  98.2 F (36.8 C)  TempSrc: Oral   Oral  SpO2: 99% 97%  100%  Weight:   93.9 kg   Height:       General exam: awake,  alert, no acute distress HEENT: moist mucus membranes, hearing grossly normal  Respiratory system: on room air, normal respiratory effort. Cardiovascular system: normal S1/S2, RRR Gastrointestinal system: distended, non-tender, hypoactive bowel sounds Genitourinary system: suprapubic catheter in place Central  nervous system: A&O x3. no gross focal neurologic deficits, normal speech Extremities: stable R>L lower extremity edema and BLE mild erythema without any differential warmth on palpation Psychiatry: normal mood, congruent affect   Data Reviewed:  Notable labs --   BMP with glucose 108, Ca 8.7 otherwise normal   CBC 3/9 with WBC normalized 23.8 >> 15.1 >> 9.3, Hbg 10.1 >> 11.2  CBG's at goal   Family Communication: Updated sister by phone 3/9. None present on rounds, will attempt to call as time allows.   Disposition: Status is: Inpatient Remains inpatient appropriate because: remains on IV antibiotic due to multi-drug resistant UTI.      Planned Discharge Destination: return to prior facility     Time spent: 36 minutes  Author: Pennie Banter, DO 06/10/2023 2:31 PM  For on call review [email or web address].

## 2023-06-11 DIAGNOSIS — I5033 Acute on chronic diastolic (congestive) heart failure: Secondary | ICD-10-CM | POA: Diagnosis not present

## 2023-06-11 LAB — GLUCOSE, CAPILLARY
Glucose-Capillary: 85 mg/dL (ref 70–99)
Glucose-Capillary: 96 mg/dL (ref 70–99)
Glucose-Capillary: 120 mg/dL — ABNORMAL HIGH (ref 70–99)
Glucose-Capillary: 153 mg/dL — ABNORMAL HIGH (ref 70–99)

## 2023-06-11 LAB — CULTURE, BLOOD (ROUTINE X 2)
Culture: NO GROWTH
Culture: NO GROWTH
Special Requests: ADEQUATE

## 2023-06-11 MED ORDER — ORAL CARE MOUTH RINSE: 15.0000 mL | OROMUCOSAL | Status: DC | PRN

## 2023-06-11 NOTE — TOC Progression Note (Signed)
 Transition of Care [facility]) - Progression Note    Patient Details  Name: Raymond Hays MRN: [id number] Date of Birth: [birth year]  Transition of Care [facility]) CM/SW Contact  Chapman Fitch, RN Phone Number: 06/11/2023, 3:27 PM  Clinical Narrative:     Per Leavy Cella at [facility] "I will ask the provider to enter a new consult. Once consult has been entered, we can provide authorization to the Facility. "  Stanton Kidney at [facility] also included in email chain.   Per MD anticipated dc tomorrow.  Jasmine at [facility] notified   Expected Discharge Plan: Skilled Nursing Facility Barriers to Discharge: Continued Medical Work up  Expected Discharge Plan and Services       Living arrangements for the past 2 months: Skilled Nursing Facility                                       Social Determinants of Health (SDOH) Interventions SDOH Screenings   Food Insecurity: No Food Insecurity (06/08/2023)  Housing: Unknown (06/08/2023)  Transportation Needs: No Transportation Needs (06/08/2023)  Utilities: Not At Risk (06/08/2023)  Depression (PHQ2-9): Low Risk  (09/25/2022)  Financial Resource Strain: Low Risk  (05/02/2023)  Social Connections: Unknown (06/08/2023)  Tobacco Use: Medium Risk (06/06/2023)    Readmission Risk Interventions    03/12/2023    2:19 PM 01/12/2021    1:08 PM 10/20/2020    8:59 AM  Readmission Risk Prevention Plan  Transportation Screening  Complete Complete  PCP or Specialist Appt within 3-5 Days   Complete  HRI or Home Care Consult   Complete  Social Work Consult for Recovery Care Planning/Counseling     Palliative Care Screening   Not Applicable  Medication Review [employer])  Complete Referral to Pharmacy  PCP or Specialist appointment within 3-5 days of discharge  Complete   HRI or Home Care Consult  Complete   SW Recovery Care/Counseling Consult  Complete   Palliative Care Screening  Not Applicable   Skilled Nursing Facility  Complete      Information is  confidential and restricted. Go to Review Flowsheets to unlock data.

## 2023-06-11 NOTE — Plan of Care (Signed)

## 2023-06-11 NOTE — Progress Notes (Signed)
 PROGRESS NOTE    Raymond Hays  WUX:[id number] DOB: May 30, 1945 DOA: 06/06/2023 PCP: Pearson Grippe, MD  220A/220A-AA  LOS: 5 days   Brief hospital course:   Assessment & Plan: HPI on admission 06/06/23: "Raymond Hays is a 78 y.o. 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 "... See H&P for full HPI on admission & ED course.   Pt was admitted for further evaluation and management as outlined below. Treated initially with IV Lasix 20 mg x 1 dose. Started on empiric IV antibiotic for complicated UTI pending culture results.   Further hospital course and management as outlined below.   Acute on chronic diastolic CHF (congestive heart failure) (HCC): pt has shortness breath, leg edema, elevated BNP 320, crackles on auscultation, positive JVD, clinically consistent with CHF exacerbation.   Echo on 09/16/2022 showed EF of 55 to 60%.   U/S doppler of BLE's negative for DVT's Given single dose IV Lasix 20 mg on admission --Further diuresis PRN and as BP tolerates  Hypotension --Started on midodrine 10 mg TID on admission  --wean midodrine as tolerated   Complicated UTI (urinary tract infection) in the setting of suprapubic catheter placement: Suprapubic catheter is changed in ED. Urine culture grew Pseudomonas and multi-resistant Klebsiella ? Colonlization, but clinically suspect true UTI contributed most to his presentation --PO Cipro to cover Pseudomonas  --IV meropenem for MDR Klebsiella --5 days tx   Acute metabolic encephalopathy: Likely due to multifactorial etiology, including UTI, hypoxia.  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 --mental status back to baseline    Paroxysmal A fib: HR controlled on admission 3/9 -- HR's elevated off Cardizem and metop 3/10 -- HR's controlled  --Continue home Cardizem and metoprolol at reduced dose due to soft BP's --cont Eliquis   Essential hypertension --cont cardizem and Lopressor  Parkinson disease  --Fall precautions --Resides at nursing facility --cont Sinemet   Type 2 diabetes mellitus with peripheral neuropathy [facility]): Recent A1c 7.4, poorly controlled.  Patient is taking glargine insulin 10 units daily. --Glargine insulin 7 units daily --ACHS and SSI   Hypothyroidism --Continue Synthroid   HLD (hyperlipidemia) --Continue Lipitor   Depression with anxiety --Continue home medications   Obesity (BMI 30-39.9) Body mass index is 32.42 kg/m. Complicates overall care and prognosis.  Recommend lifestyle modifications including physical activity and diet for weight loss and overall long-term health.    DVT prophylaxis: OZ:[id number] Code Status: DNR  Family Communication:  Level of care: Med-Surg Dispo:   The patient is from: SNF Anticipated d/c is to: SNF Anticipated d/c date is: medically ready tomorrow   Subjective and Interval History:  Pt complained of suprapubic cath leaking, which he said has been a problem for the past 2 years.   Objective: Vitals:   06/11/23 0421 06/11/23 0441 06/11/23 0821 06/11/23 1609  BP: 104/69  112/78 (!) 90/57  Pulse: 83  83 81  Resp: 20  16 16   Temp: 98.4 F (36.9 C)  98.1 F (36.7 C) 98.1 F (36.7 C)  TempSrc:   Oral Oral  SpO2: 100%  100% 98%  Weight:  96.1 kg    Height:        Intake/Output Summary (Last 24 hours) at 06/11/2023 1842 Last data filed at 06/11/2023 1839  Gross per 24 hour  Intake 1378.95 ml  Output 700 ml  Net 678.95 ml   Filed Weights   06/09/23 0500 06/10/23 0500 06/11/23 0441  Weight: 93.6 kg 93.9 kg 96.1 kg    Examination:   Constitutional: NAD, AAOx3 HEENT: conjunctivae and lids normal, EOMI CV: No cyanosis.    RESP: normal respiratory effort Neuro: II - XII grossly intact.   Psych: Normal mood and affect.  Appropriate judgement and reason Suprapubic cath present   Data Reviewed: I have personally reviewed labs and imaging studies  Time spent: 50 minutes  [name], MD [facility] If 7PM-7AM, please contact night-coverage 06/11/2023, 6:42 PM

## 2023-06-12 DIAGNOSIS — I5033 Acute on chronic diastolic (congestive) heart failure: Secondary | ICD-10-CM | POA: Diagnosis not present

## 2023-06-12 LAB — GLUCOSE, CAPILLARY
Glucose-Capillary: 102 mg/dL — ABNORMAL HIGH (ref 70–99)
Glucose-Capillary: 168 mg/dL — ABNORMAL HIGH (ref 70–99)
Glucose-Capillary: 137 mg/dL — ABNORMAL HIGH (ref 70–99)
Glucose-Capillary: 166 mg/dL — ABNORMAL HIGH (ref 70–99)

## 2023-06-12 LAB — CBC
HCT: 31.8 % — ABNORMAL LOW (ref 39.0–52.0)
Hemoglobin: 10.5 g/dL — ABNORMAL LOW (ref 13.0–17.0)
MCH: 30.3 pg (ref 26.0–34.0)
MCHC: 33 g/dL (ref 30.0–36.0)
MCV: 91.6 fL (ref 80.0–100.0)
Platelets: 223 10*3/uL (ref 150–400)
RBC: 3.47 MIL/uL — ABNORMAL LOW (ref 4.22–5.81)
RDW: 18.3 % — ABNORMAL HIGH (ref 11.5–15.5)
WBC: 7.8 10*3/uL (ref 4.0–10.5)
nRBC: 0 % (ref 0.0–0.2)

## 2023-06-12 LAB — BASIC METABOLIC PANEL
Anion gap: 6 (ref 5–15)
BUN: 16 mg/dL (ref 8–23)
CO2: 27 mmol/L (ref 22–32)
Calcium: 8.7 mg/dL — ABNORMAL LOW (ref 8.9–10.3)
Chloride: 104 mmol/L (ref 98–111)
Creatinine, Ser: 0.84 mg/dL (ref 0.61–1.24)
GFR, Estimated: >60 mL/min (ref >60)
Glucose, Bld: 110 mg/dL — ABNORMAL HIGH (ref 70–99)
Potassium: 4.3 mmol/L (ref 3.5–5.1)
Sodium: 137 mmol/L (ref 135–145)

## 2023-06-12 LAB — MAGNESIUM: Magnesium: 2.1 mg/dL (ref 1.7–2.4)

## 2023-06-12 MED ORDER — SODIUM CHLORIDE 3 % IN NEBU
4.0000 mL | INHALATION_SOLUTION | Freq: Two times a day (BID) | RESPIRATORY_TRACT | Status: DC
  Administered 2023-06-12 – 2023-06-13 (×3): 4 mL via RESPIRATORY_TRACT
  Filled 2023-06-12 (×4): qty 4

## 2023-06-12 MED ORDER — IPRATROPIUM-ALBUTEROL 0.5-2.5 (3) MG/3ML IN SOLN
3.0000 mL | Freq: Two times a day (BID) | RESPIRATORY_TRACT | Status: DC
  Administered 2023-06-12 – 2023-06-13 (×3): 3 mL via RESPIRATORY_TRACT
  Filled 2023-06-12 (×3): qty 3

## 2023-06-12 MED ORDER — POLYVINYL ALCOHOL 1.4 % OP SOLN: 2.0000 [drp] | Freq: Three times a day (TID) | OPHTHALMIC | Status: DC | PRN

## 2023-06-12 MED ORDER — IPRATROPIUM BROMIDE 0.06 % NA SOLN: 2.0000 | Freq: Three times a day (TID) | NASAL | Status: DC | PRN

## 2023-06-12 NOTE — Plan of Care (Signed)

## 2023-06-12 NOTE — Plan of Care (Signed)

## 2023-06-12 NOTE — Progress Notes (Signed)
 PROGRESS NOTE    Raymond Hays  ZOX:[id number] DOB: 14-Nov-1945 DOA: 06/06/2023 PCP: Pearson Grippe, MD  220A/220A-AA  LOS: 6 days   Brief hospital course:   Assessment & Plan: HPI on admission 06/06/23: "Raymond Hays is a 78 y.o. 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 "... See H&P for full HPI on admission & ED course.   Pt was admitted for further evaluation and management as outlined below. Treated initially with IV Lasix 20 mg x 1 dose. Started on empiric IV antibiotic for complicated UTI pending culture results.   Further hospital course and management as outlined below.   Acute on chronic hypoxemic respiratory failure 2L O2 at baseline --Per report, pt was found to have SOB with hypoxia, initially required 5 L oxygen and NRB in ED.  Presumed due to CHF as below.  Acute on chronic diastolic CHF (congestive heart failure) (HCC): pt has shortness breath, leg edema, elevated BNP 320, crackles on auscultation, positive JVD, clinically consistent with CHF exacerbation.   Echo on 09/16/2022 showed EF of 55 to 60%.   U/S doppler of BLE's negative for DVT's Given single dose IV Lasix 20 mg on admission  Hypotension --Started on midodrine 10 mg TID on admission  --cont midodrine and wean   Complicated UTI (urinary tract infection) in the setting of suprapubic catheter placement: Suprapubic catheter is changed in ED. Urine culture grew Pseudomonas and multi-resistant Klebsiella ? Colonlization, but clinically suspect true UTI contributed most to his presentation --s/p 4 days of PO Cipro to cover Pseudomonas  --s/p 5 days of IV meropenem for MDR Klebsiella   Acute metabolic encephalopathy: Likely due to  multifactorial etiology, including UTI, hypoxia.  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 --mental status back to baseline   Paroxysmal A fib: HR controlled on admission --Continue home Cardizem and metoprolol at reduced doses due to soft BP's --cont Eliquis   Essential hypertension --cont cardizem and metop at reduced doses due to soft BP  Parkinson disease  --Fall precautions --Resides at nursing facility --cont Sinemet   Type 2 diabetes mellitus with peripheral neuropathy [facility]): Recent A1c 7.4, poorly controlled.  Patient is taking glargine insulin 10 units daily. --Glargine insulin 7 units daily --ACHS and SSI   Hypothyroidism --Continue Synthroid   HLD (hyperlipidemia) --Continue Lipitor   Depression with anxiety --cont Celexa and nortriptyline   Obesity (BMI 30-39.9) Body mass index is 32.42 kg/m. Complicates overall care and prognosis.  Recommend lifestyle modifications including physical activity and diet for weight loss and overall long-term health.    DVT prophylaxis: [id number] Code Status: DNR  Family Communication:  Level of care: Med-Surg Dispo:   The patient is from: SNF Anticipated d/c is to: SNF Anticipated d/c date is: whenever bed available   Subjective and Interval History:  No new complaint today.  Pt said he is ready to leave the hospital.   Objective: Vitals:   06/12/23 0317 06/12/23 0716 06/12/23 1038 06/12/23 1431  BP: 105/76 112/77  108/78  Pulse: 82 84  90  Resp: 17 18  20   Temp: 98 F (36.7 C) 98.2 F (36.8 C)  98 F (36.7 C)  TempSrc: Oral Oral  Oral  SpO2: 99% 100% 95% 100%  Weight:  Height:        Intake/Output Summary (Last 24 hours) at 06/12/2023 1841 Last data filed at 06/12/2023 1537 Gross per 24 hour  Intake 820 ml  Output 500 ml  Net 320 ml   Filed Weights   06/10/23 0500 06/11/23 0441 06/12/23 0317  Weight: 93.9 kg 96.1 kg 94.7 kg    Examination:   Constitutional:  NAD, AAOx3 HEENT: conjunctivae and lids normal, EOMI CV: No cyanosis.   RESP: normal respiratory effort, on RA Neuro: II - XII grossly intact.   Psych: Normal mood and affect.  Appropriate judgement and reason   Data Reviewed: I have personally reviewed labs and imaging studies  Time spent: 35 minutes  [name], MD [facility] If 7PM-7AM, please contact night-coverage 06/12/2023, 6:41 PM

## 2023-06-13 DIAGNOSIS — I5033 Acute on chronic diastolic (congestive) heart failure: Secondary | ICD-10-CM | POA: Diagnosis not present

## 2023-06-13 LAB — GLUCOSE, CAPILLARY
Glucose-Capillary: 103 mg/dL — ABNORMAL HIGH (ref 70–99)
Glucose-Capillary: 135 mg/dL — ABNORMAL HIGH (ref 70–99)

## 2023-06-13 MED ORDER — METOPROLOL TARTRATE 25 MG PO TABS: 12.5000 mg | ORAL_TABLET | Freq: Two times a day (BID) | ORAL | Status: AC

## 2023-06-13 MED ORDER — MIDODRINE HCL 2.5 MG PO TABS: 2.5000 mg | ORAL_TABLET | Freq: Three times a day (TID) | ORAL | Status: AC

## 2023-06-13 MED ORDER — MIRABEGRON ER 25 MG PO TB24: 25.0000 mg | ORAL_TABLET | Freq: Every day | ORAL | Status: AC

## 2023-06-13 MED ORDER — ZINC OXIDE 40 % EX OINT: TOPICAL_OINTMENT | Freq: Two times a day (BID) | CUTANEOUS | Status: AC

## 2023-06-13 NOTE — [place] FL2 (Signed)
 [place] MEDICAID FL2 LEVEL OF CARE FORM     IDENTIFICATION  Patient Name: Raymond Hays Birthdate: May 04, 1945 Sex: male Admission Date (Current Location): 06/06/2023  [facility] and [place] Number:  Chiropodist and Address:  [facility], [address], [place], [place] [address]      Provider Number: [id number]  Attending Physician Name and Address:  [name], MD  Relative Name and Phone Number:       Current Level of Care: Hospital Recommended Level of Care: Skilled Nursing Facility Prior Approval Number:    Date Approved/Denied:   PASRR Number: [id number]  Discharge Plan:      Current Diagnoses: Patient Active Problem List   Diagnosis Date Noted   Advance care planning 05/07/2023   Multifocal pneumonia 04/30/2023   COPD exacerbation (HCC) 04/29/2023   Acute on chronic diastolic CHF (congestive heart failure) (HCC) 04/29/2023   Acute on chronic respiratory failure with hypoxia (HCC) 04/29/2023   HLD (hyperlipidemia) 04/29/2023   Abdominal distension 04/29/2023   Acute metabolic encephalopathy 04/29/2023   Pneumonia of right lower lobe due to infectious organism 03/12/2023   Ambulatory dysfunction 03/12/2023   Dehydration 12/07/2022   Paroxysmal atrial fibrillation with rapid ventricular response (HCC) 12/07/2022   Constipation 12/07/2022   Thoracic spondylosis with cord compression (T10) 09/25/2022   Hx of thoracic laminectomy (T11-12) 09/25/2022   Metabolic acidosis 09/15/2022   Aspiration pneumonia of right lower lobe (HCC) 09/15/2022   Complicated UTI (urinary tract infection) 09/14/2022   Hypothyroidism 09/14/2022   Depression with anxiety 09/14/2022   Obesity (BMI 30-39.9) 09/14/2022   Prolonged QT interval 09/14/2022   Suprapubic catheter (HCC) 09/14/2022   Abnormal MRI, thoracic spine (08/23/2022) 09/03/2022   DDD (degenerative disc disease), cervical 08/05/2022   Neurogenic incontinence 08/05/2022   Thoracic central  spinal stenosis (SEVERE: T10-11) 08/05/2022   Foraminal stenosis of thoracic region 08/05/2022   Thoracic Facet Arthropathy 08/05/2022   Lower extremity numbness (Bilateral) 08/05/2022   Weakness of lower extremity (Bilateral) 08/05/2022   Chronic midline low back pain with left-sided sciatica 08/05/2022   Left foot drop 08/05/2022   Lumbar postlaminectomy syndrome 08/05/2022   Poor memory 08/05/2022   Vitamin B12 deficiency 07/25/2022   Pharmacologic therapy 07/24/2022   Disorder of skeletal system 07/24/2022   Problems influencing health status 07/24/2022   Elevated sed rate 07/24/2022   Elevated C-reactive protein (CRP) 07/24/2022   Chronic anticoagulation (ELIQUIS) 07/24/2022   Chronic neck and back pain (1ry area of Pain) (Bilateral) (L>R) 07/24/2022   Abnormal MRI, cervical spine (08/23/2022) 07/24/2022   History of cervical spinal surgery 07/24/2022   History of lumbar surgery 07/24/2022   History of thoracic surgery 07/24/2022   Laryngopharyngeal reflux 06/17/2022   Throat clearing 06/17/2022   Acute respiratory failure (HCC) 04/03/2021   E. 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   Chronic respiratory failure with hypoxia (HCC) 01/10/2020   Impaired functional mobility, balance, gait, and endurance 09/15/2019   Catheter cystitis (HCC) 10/16/2018   Urinary retention 07/14/2018  COVID-19 virus detected 07/03/2018   Benign prostatic hyperplasia with lower urinary tract symptoms 08/06/2017   Osteoarthritis of right knee 06/19/2017   S/P orthopedic surgery, follow-up exam 10/25/2016    Tendon rupture of wrist, sequela 01/02/2016   Rotator cuff tear arthropathy of right shoulder 09/28/2015   Acute pain of right knee 08/03/2015   Chronic knee pain (Right) 08/03/2015   Calcific tendinitis of left shoulder 07/05/2015   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/05/2015   Primary osteoarthritis of left shoulder 07/05/2015   Gait difficulty 06/26/2015   Severe mitral regurgitation 06/05/2015   AF (paroxysmal atrial fibrillation) (HCC) 05/31/2015   Chronic pain syndrome 05/31/2015   COPD with acute exacerbation (HCC) 05/31/2015   Essential hypertension 05/31/2015   Atrial fibrillation (HCC) 05/31/2015   Chronic wrist pain (Right) 04/24/2015   Arthritis of left wrist 04/24/2015   Rupture of extensor tendon of left hand 04/24/2015    Orientation RESPIRATION BLADDER Height & Weight     Self, Time, Situation, Place  Normal Incontinent, External catheter Weight: 212 lb 1.3 oz (96.2 kg) Height:  5\' 7"  (170.2 cm)  BEHAVIORAL SYMPTOMS/MOOD NEUROLOGICAL BOWEL NUTRITION STATUS      Incontinent Diet (2 gram sodium)  AMBULATORY STATUS COMMUNICATION OF NEEDS Skin   Limited Assist Verbally Other (Comment) (stage 2 buttocks, stage 2 thigh, skin tear coccyx)                       Personal Care Assistance Level of Assistance  Bathing, Feeding, Dressing Bathing Assistance: Limited assistance Feeding assistance: Limited assistance Dressing Assistance: Limited assistance     Functional Limitations Info             SPECIAL CARE FACTORS FREQUENCY                       Contractures      Additional Factors Info  Code Status, Allergies Code Status Info: Limited: Do not attempt resuscitation (DNR) -DNR-LIMITED -Do Not Intubate/DNI Allergies Info: nka           Current Medications (06/13/2023):  This is the current hospital active medication list Current Facility-Administered Medications  Medication Dose Route Frequency Provider Last Rate Last Admin    acetaminophen (TYLENOL) tablet 650 mg  650 mg Oral Q6H PRN Lorretta Harp, MD       acidophilus (RISAQUAD) capsule 1 capsule  1 capsule Oral Daily Esaw Grandchild A, DO   1 capsule at 06/12/23 0815   albuterol (PROVENTIL) (2.5 MG/3ML) 0.083% nebulizer solution 2.5 mg  2.5 mg Nebulization Q4H PRN Lorretta Harp, MD   2.5 mg at 06/10/23 0944   allopurinol (ZYLOPRIM) tablet 200 mg  200 mg Oral Daily Lorretta Harp, MD   200 mg at 06/12/23 0818   apixaban (ELIQUIS) tablet 5 mg  5 mg Oral BID Lorretta Harp, MD   5 mg at 06/12/23 2223   ascorbic acid (VITAMIN C) tablet 500 mg  500 mg Oral Daily Esaw Grandchild A, DO   500 mg at 06/12/23 0815   atorvastatin (LIPITOR) tablet 20 mg  20 mg Oral QHS Lorretta Harp, MD   20 mg at 06/12/23 2223   calcium carbonate (TUMS - dosed in mg elemental calcium) chewable tablet 400 mg of elemental calcium  2 tablet Oral Daily PRN Esaw Grandchild A, DO       camphor-menthol (SARNA) lotion 1 Application  1 Application Topical TID Esaw Grandchild A, DO   1 Application at 06/12/23  2228   carbidopa-levodopa (SINEMET IR) 25-100 MG per tablet immediate release 1 tablet  1 tablet Oral TID Langley Gauss, MD   1 tablet at 06/13/23 0851   cholecalciferol (VITAMIN D3) 25 MCG (1000 UNIT) tablet 2,000 Units  2,000 Units Oral Daily Esaw Grandchild A, DO   2,000 Units at 06/12/23 1048   citalopram (CELEXA) tablet 10 mg  10 mg Oral QHS Lorretta Harp, MD   10 mg at 06/12/23 2222   dextromethorphan-guaiFENesin (MUCINEX DM) 30-600 MG per 12 hr tablet 1 tablet  1 tablet Oral BID PRN Lorretta Harp, MD   1 tablet at 06/12/23 1048   diltiazem (CARDIZEM CD) 24 hr capsule 240 mg  240 mg Oral Daily Esaw Grandchild A, DO   240 mg at 06/12/23 0817   feeding supplement (ENSURE ENLIVE / ENSURE PLUS) liquid 237 mL  237 mL Oral BID BM Esaw Grandchild A, DO   237 mL at 06/12/23 1506   ferrous sulfate tablet 325 mg  325 mg Oral Daily Lorretta Harp, MD   325 mg at 06/12/23 1506   fluticasone (FLONASE) 50 MCG/ACT nasal spray 2 spray  2  spray Each Nare Daily PRN Esaw Grandchild A, DO       fluticasone furoate-vilanterol (BREO ELLIPTA) 100-25 MCG/ACT 1 puff  1 puff Inhalation Daily Esaw Grandchild A, DO   1 puff at 06/12/23 4098   And   umeclidinium bromide (INCRUSE ELLIPTA) 62.5 MCG/ACT 1 puff  1 puff Inhalation Daily Esaw Grandchild A, DO   1 puff at 06/12/23 1191   gabapentin (NEURONTIN) capsule 800 mg  800 mg Oral QID Lorretta Harp, MD   800 mg at 06/12/23 2223   hydrALAZINE (APRESOLINE) injection 5 mg  5 mg Intravenous Q2H PRN Lorretta Harp, MD   5 mg at 06/07/23 2205   hydrocortisone (ANUSOL-HC) 2.5 % rectal cream 1 Application  1 Application Topical QID PRN Esaw Grandchild A, DO       hydrocortisone cream 1 % 1 Application  1 Application Topical TID PRN Esaw Grandchild A, DO   1 Application at 06/08/23 2253   hydrocortisone cream 1 %   Topical TID Esaw Grandchild A, DO   Given at 06/12/23 2226   hydrOXYzine (ATARAX) tablet 25 mg  25 mg Oral Q6H PRN Lorretta Harp, MD       insulin aspart (novoLOG) injection 0-5 Units  0-5 Units Subcutaneous QHS Lorretta Harp, MD       insulin aspart (novoLOG) injection 0-9 Units  0-9 Units Subcutaneous TID WC Lorretta Harp, MD   1 Units at 06/12/23 1846   insulin glargine-yfgn (SEMGLEE) injection 7 Units  7 Units Subcutaneous Daily Lorretta Harp, MD   7 Units at 06/12/23 0824   ipratropium (ATROVENT) 0.06 % nasal spray 2 spray  2 spray Each Nare TID PRN [name], MD       ipratropium-albuterol (DUONEB) 0.5-2.5 (3) MG/3ML nebulizer solution 3 mL  3 mL Nebulization BID [name], MD   3 mL at 06/13/23 0846   lactase (LACTAID) tablet 3,000 Units  3,000 Units Oral TID WC Esaw Grandchild A, DO   3,000 Units at 06/13/23 0851   levothyroxine (SYNTHROID) tablet 75 mcg  75 mcg Oral Q0600 Lorretta Harp, MD   75 mcg at 06/13/23 0529   liver oil-zinc oxide (DESITIN) 40 % ointment   Topical BID Esaw Grandchild A, DO   Given at 06/12/23 2227   loperamide (IMODIUM) capsule 2 mg  2 mg Oral PRN Denton Lank,  Kelly A, DO   2 mg at  06/09/23 1637   methocarbamol (ROBAXIN) tablet 1,000 mg  1,000 mg Oral Q12H Esaw Grandchild A, DO   1,000 mg at 06/12/23 2224   metoprolol tartrate (LOPRESSOR) injection 2.5 mg  2.5 mg Intravenous Q2H PRN Lorretta Harp, MD       metoprolol tartrate (LOPRESSOR) tablet 12.5 mg  12.5 mg Oral BID Esaw Grandchild A, DO   12.5 mg at 06/12/23 2223   midodrine (PROAMATINE) tablet 2.5 mg  2.5 mg Oral TID WC Esaw Grandchild A, DO   2.5 mg at 06/13/23 0851   nortriptyline (PAMELOR) capsule 20 mg  20 mg Oral QHS Lorretta Harp, MD   20 mg at 06/12/23 2225   ondansetron (ZOFRAN) injection 4 mg  4 mg Intravenous Q8H PRN Lorretta Harp, MD       Oral care mouth rinse  15 mL Mouth Rinse PRN Esaw Grandchild A, DO       oxybutynin (DITROPAN) tablet 5 mg  5 mg Oral BID Lorretta Harp, MD   5 mg at 06/12/23 2223   pantoprazole (PROTONIX) EC tablet 40 mg  40 mg Oral Daily Lorretta Harp, MD   40 mg at 06/12/23 0815   polyethylene glycol (MIRALAX / GLYCOLAX) packet 17 g  17 g Oral Daily Esaw Grandchild A, DO   17 g at 06/09/23 1010   polyvinyl alcohol (LIQUIFILM TEARS) 1.4 % ophthalmic solution 2 drop  2 drop Both Eyes TID PRN [name], MD       psyllium (HYDROCIL/METAMUCIL) 1 packet  1 packet Oral TID Esaw Grandchild A, DO   1 packet at 06/12/23 2225   senna-docusate (Senokot-S) tablet 1 tablet  1 tablet Oral BID Esaw Grandchild A, DO   1 tablet at 06/12/23 2222   sodium chloride (OCEAN) 0.65 % nasal spray 2 spray  2 spray Each Nare Q6H PRN Esaw Grandchild A, DO       sodium chloride HYPERTONIC 3 % nebulizer solution 4 mL  4 mL Nebulization BID [name], MD   4 mL at 06/13/23 0846     Discharge Medications: Please see discharge summary for a list of discharge medications.  Relevant Imaging Results:  Relevant Lab Results:   Additional Information SS#: [id number]  Hulon Ferron E Deepti Gunawan, LCSW

## 2023-06-13 NOTE — Discharge Summary (Addendum)
 Physician Discharge Summary   Raymond Hays  male DOB: 05-07-1945  ZOX:[id number]  PCP: Pearson Grippe, MD  Admit date: 06/06/2023 Discharge date: 06/13/2023  Admitted From: SNF  Disposition:  SNF CODE STATUS: DNR  Discharge Instructions     Discharge wound care:   Complete by: As directed    Every shift      Cleanse around suprapubic catheter with Vashe wound cleanser Hart [place] [phone]), do not rinse and allow to air dry.  Apply a thin layer of Desitin to irritated skin surrounding SP catheter.  May cut a piece of silver hydrofiber (Aquacel AG Hart Rochester [phone]) like a split gauze and put around [facility] catheter/under bumper.  If more absorption is required can try Drawtex (superabsorbent dressing) around SP catheter Hart Rochester #[id number], and towels. [facility] Course:  For full details, please see H&P, progress notes, consult notes and ancillary notes.  Briefly,  Raymond Hays is a 78 y.o. male with medical history significant of COPD on 2L O2, HTN, dCHF, suprapubic Foley catheter, neurogenic bladder, A-fib on Eliquis, ESBL UTI, spinal stenosis, wheelchair bound, Parkinson's disease, hypertension, diet controled DM, who presented with dyspnea and AMS.   Acute on chronic hypoxemic respiratory failure 2L O2 PRN at baseline --Per report, pt was found to have SOB with hypoxia, initially required 5 L oxygen and NRB in ED.  Presumed due to CHF as below.  Pt was sating well on room air prior to discharge.   Acute on chronic diastolic CHF (congestive heart failure) (HCC):  pt had shortness breath, leg edema, elevated BNP 320, crackles on auscultation, positive JVD, clinically consistent with CHF exacerbation.   Echo on 09/16/2022 showed EF of 55 to 60%.   U/S doppler of BLE's negative for DVT's Given single dose IV Lasix 20 mg on admission.  Further diuresis limited by hypotension. --resume home lasix 40 mg daily after discharge.   Hypotension --Started on midodrine 10 mg TID on admission,  titrated down to 2.5 mg TID prior to discharge.   Complicated UTI (urinary tract infection) due to chronic suprapubic catheter:  Suprapubic catheter was reportedly changed in ED. Urine culture grew Pseudomonas and multi-resistant Klebsiella Possible Colonization, but treated with 4 days of PO Cipro to cover Pseudomonas and 5 days of IV meropenem for MDR Klebsiella  Sepsis, ruled in --met criteria with tachypnea, leukocytosis, UTI.  Leaking around suprapubic catheter, chronic --per pt, this is a chronic problem for the past 2 years, since cath was first placed.  Discussed with oncall urology who had seen pt in the past.  Unfortunately, suprapubic cath often leak and upsizing cath may help temporarily, but the hole will get bigger and start leaking again.  Rec reducing bladder pressure to reduce leakage. --cont home oxybutynin --add mirabegron 25 mg daily --may titrate up both as needed --cath site care as listed above to prevent skin breakdown    Acute metabolic encephalopathy:  Likely due to multifactorial etiology, including UTI, hypoxia.  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 --mental status back to baseline   Paroxysmal A fib:  HR controlled on admission --Continue home Cardizem  --home Lopressor reduced from 50 mg BID to 12.5 mg BID due to low BP. --cont Eliquis   Essential hypertension, not currently active   Parkinson disease  --Fall precautions --Resides at nursing facility --cont Sinemet   Type 2 diabetes mellitus with peripheral neuropathy [facility]):  Recent A1c 7.4, poorly controlled.   --cont  home glargine insulin 10 units daily after discharge.   Hypothyroidism --Continue Synthroid   HLD (hyperlipidemia) --Continue Lipitor   Depression with anxiety --not taking Celexa and nortriptyline PTA   Obesity (BMI 30-39.9) Body mass index is 32.42 kg/m. Complicates overall care and prognosis.  Recommend lifestyle modifications  including physical activity and diet for weight loss and overall long-term health.   Unless noted above, medications under "STOP" list are ones pt was not taking PTA.  Discharge Diagnoses:  Principal Problem:   Acute on chronic diastolic CHF (congestive heart failure) (HCC) Active Problems:   Complicated UTI (urinary tract infection)   Suprapubic catheter (HCC)   Acute metabolic encephalopathy   AF (paroxysmal atrial fibrillation) (HCC)   Essential hypertension   Type 2 diabetes mellitus with peripheral neuropathy (HCC)   Parkinson disease (HCC)   Hypothyroidism   HLD (hyperlipidemia)   Depression with anxiety   Obesity (BMI 30-39.9)   30 Day Unplanned Readmission Risk Score    Flowsheet Row ED to Hosp-Admission (Current) from 06/06/2023 in [facility]  30 Day Unplanned Readmission Risk Score (%) 39.29 Filed at 06/13/2023 0801       This score is the patient's risk of an unplanned readmission within 30 days of being discharged (0 -100%). The score is based on dignosis, age, lab data, medications, orders, and past utilization.   Low:  0-14.9   Medium: 15-21.9   High: 22-29.9   Extreme: 30 and above         Discharge Instructions:  Allergies as of 06/13/2023   No Known Allergies      Medication List     STOP taking these medications    cetirizine 5 MG tablet Commonly known as: ZYRTEC   citalopram 10 MG tablet Commonly known as: CELEXA   famotidine 20 MG tablet Commonly known as: PEPCID   melatonin 5 MG Tabs   nortriptyline 10 MG capsule Commonly known as: PAMELOR   selenium sulfide 1 % Lotn Commonly known as: SELSUN   silver sulfADIAZINE 1 % cream Commonly known as: SILVADENE   tacrolimus 0.03 % ointment Commonly known as: PROTOPIC   terbinafine 1 % cream Commonly known as: LAMISIL       TAKE these medications    acetaminophen 325 MG tablet Commonly known as: TYLENOL Take 2 tablets (650 mg total) by  mouth every 6 (six) hours as needed for mild pain (pain score 1-3), fever or moderate pain (pain score 4-6).   Acidophilus Probiotic Blend Caps Take 1 capsule by mouth daily.   Albuterol Sulfate 108 (90 Base) MCG/ACT Aepb Commonly known as: PROAIR RESPICLICK Inhale 2 puffs into the lungs every 4 (four) hours as needed (COPD).   allopurinol 100 MG tablet Commonly known as: ZYLOPRIM Take 200 mg by mouth daily.   apixaban 5 MG Tabs tablet Commonly known as: ELIQUIS Take 5 mg by mouth 2 (two) times daily.   ascorbic acid 500 MG tablet Commonly known as: VITAMIN C Take 500 mg by mouth daily.   atorvastatin 20 MG tablet Commonly known as: LIPITOR Take 20 mg by mouth at bedtime.   Biofreeze 4 % Gel Generic drug: Menthol (Topical Analgesic) Apply 1 application  topically 3 (three) times daily. (Apply to neck)   bisacodyl 10 MG suppository Commonly known as: DULCOLAX Place 10 mg rectally every 3 (three) days. (HOLD if having loose stools)   calcium carbonate 500 MG chewable tablet Commonly known as: TUMS - dosed in mg elemental calcium  Chew 2 tablets by mouth daily as needed for indigestion or heartburn.   carbidopa-levodopa 25-100 MG tablet Commonly known as: SINEMET IR Take 1 tablet by mouth 3 (three) times daily before meals.   cetaphil cream Apply 1 Application topically 2 (two) times daily as needed.   dextromethorphan-guaiFENesin 30-600 MG 12hr tablet Commonly known as: MUCINEX DM Take 1 tablet by mouth 2 (two) times daily as needed for cough.   diltiazem 240 MG 24 hr capsule Commonly known as: CARDIZEM CD Take 1 capsule (240 mg total) by mouth daily.   docusate sodium 100 MG capsule Commonly known as: COLACE Take 100 mg by mouth daily.   feeding supplement Liqd Take 237 mLs by mouth 2 (two) times daily between meals.   ferrous sulfate 324 (65 Fe) MG Tbec Take 1 tablet by mouth daily.   fluticasone 50 MCG/ACT nasal spray Commonly known as: FLONASE Place 2  sprays into both nostrils daily.   Fluticasone-Umeclidin-Vilant 100-62.5-25 MCG/ACT Aepb Inhale 1 puff into the lungs daily.   furosemide 20 MG tablet Commonly known as: LASIX Take 2 tablets (40 mg total) by mouth daily.   gabapentin 800 MG tablet Commonly known as: NEURONTIN Take 800 mg by mouth 4 (four) times daily.   hydrocortisone 25 MG suppository Commonly known as: ANUSOL-HC Place 25 mg rectally every 4 (four) hours as needed for hemorrhoids or anal itching.   hydrOXYzine 25 MG tablet Commonly known as: ATARAX Take 25 mg by mouth every 6 (six) hours as needed for itching.   insulin glargine-yfgn 100 UNIT/ML injection Commonly known as: SEMGLEE Inject 0.1 mLs (10 Units total) into the skin daily.   ipratropium 0.06 % nasal spray Commonly known as: ATROVENT Place 2 sprays into both nostrils 3 (three) times daily.   ipratropium-albuterol 0.5-2.5 (3) MG/3ML Soln Commonly known as: DUONEB Take 3 mLs by nebulization every 6 (six) hours.   lactase 3000 units tablet Commonly known as: LACTAID Take 3,000 Units by mouth 3 (three) times daily with meals.   lactulose 10 GM/15ML solution Commonly known as: CHRONULAC Take 45 mLs (30 g total) by mouth daily.   levothyroxine 75 MCG tablet Commonly known as: SYNTHROID Take 75 mcg by mouth daily before breakfast.   lidocaine 4 % Place 1 patch onto the skin daily as needed (pain). (Remove after 12 hours)   liver oil-zinc oxide 40 % ointment Commonly known as: DESITIN Apply topically 2 (two) times daily. On skin around suprapubic cathether   Metamucil Smooth Texture 58.6 % powder Generic drug: psyllium Take 1 packet by mouth 3 (three) times daily. What changed: Another medication with the same name was removed. Continue taking this medication, and follow the directions you see here.   methocarbamol 500 MG tablet Commonly known as: ROBAXIN Take 1,000 mg by mouth every 12 (twelve) hours. At 1400   metoprolol tartrate 25  MG tablet Commonly known as: LOPRESSOR Take 0.5 tablets (12.5 mg total) by mouth 2 (two) times daily. Reduced from 50 mg. What changed:  medication strength how much to take additional instructions   midodrine 2.5 MG tablet Commonly known as: PROAMATINE Take 1 tablet (2.5 mg total) by mouth 3 (three) times daily with meals.   mirabegron ER 25 MG Tb24 tablet Commonly known as: MYRBETRIQ Take 1 tablet (25 mg total) by mouth daily.   OcuSoft Lid Scrub Original Pads Place 1 Application into both eyes daily. Wipe both eyelids once per day.   OcuSoft Lid Scrub Original Pads Apply 1 Pad topically  every 12 (twelve) hours as needed.   oxybutynin 5 MG tablet Commonly known as: DITROPAN Take 5 mg by mouth 2 (two) times daily.   pantoprazole 40 MG tablet Commonly known as: PROTONIX Take 1 tablet (40 mg total) by mouth daily.   polyethylene glycol 17 g packet Commonly known as: MIRALAX / GLYCOLAX Take 17 g by mouth daily.   pramoxine 1 % Lotn Commonly known as: SARNA SENSITIVE Apply 1 Application topically 4 (four) times daily as needed.   Propylene Glycol 0.6 % Soln Place 2 drops into both eyes in the morning, at noon, and at bedtime.   senna 8.6 MG Tabs tablet Commonly known as: SENOKOT Take 1 tablet (8.6 mg total) by mouth daily as needed for mild constipation.   sodium chloride 0.65 % Soln nasal spray Commonly known as: OCEAN Place 2 sprays into both nostrils every 6 (six) hours as needed for congestion.   sodium phosphate Enem Place 133 mLs (1 enema total) rectally daily as needed for severe constipation.   tiZANidine 2 MG tablet Commonly known as: ZANAFLEX Take 2 mg by mouth 2 (two) times daily as needed for muscle spasms.   triamcinolone cream 0.1 % Commonly known as: KENALOG Apply 1 Application topically daily as needed. Apply to arms, legs, and back r/t rash   Vitamin D3 50 MCG (2000 UT) Tabs Take 1 tablet by mouth daily.               Discharge Care  Instructions  (From admission, onward)           Start     Ordered   06/13/23 0000  Discharge wound care:       Comments: Every shift      Cleanse around suprapubic catheter with Vashe wound cleanser Hart [place] [phone]), do not rinse and allow to air dry.  Apply a thin layer of Desitin to irritated skin surrounding SP catheter.  May cut a piece of silver hydrofiber (Aquacel AG Hart [place] [phone]) like a split gauze and put around [facility] catheter/under bumper.  If more absorption is required can try Drawtex (superabsorbent dressing) around SP catheter Hart [place] #[id number], and towels. - -   06/13/23 1148             Follow-up Information     Pearson Grippe, MD Follow up on 06/30/2023.   Specialty: Internal Medicine Why: Go @ 8:00am. Contact information: [address] [place] [address] [phone]                 No Known Allergies   The results of significant diagnostics from this hospitalization (including imaging, microbiology, ancillary and laboratory) are listed below for reference.   Consultations:   Procedures/Studies: US Venous Img Lower Bilateral (DVT) Result Date: 06/07/2023 CLINICAL DATA:  Bilateral lower extremity edema. EXAM: BILATERAL LOWER EXTREMITY VENOUS DOPPLER ULTRASOUND TECHNIQUE: Gray-scale sonography with graded compression, as well as color Doppler and duplex ultrasound were performed to evaluate the lower extremity deep venous systems from the level of the common femoral vein and including the common femoral, femoral, profunda femoral, popliteal and calf veins including the posterior tibial, peroneal and gastrocnemius veins when visible. The superficial great saphenous vein was also interrogated. Spectral Doppler was utilized to evaluate flow at rest and with distal augmentation maneuvers in the common femoral, femoral and popliteal veins. COMPARISON:  02/02/2021 FINDINGS: RIGHT LOWER EXTREMITY Common Femoral Vein: No evidence of thrombus. Normal  compressibility, respiratory phasicity and response to augmentation. Saphenofemoral Junction: No evidence  of thrombus. Normal compressibility and flow on color Doppler imaging. Profunda Femoral Vein: No evidence of thrombus. Normal compressibility and flow on color Doppler imaging. Femoral Vein: No evidence of thrombus. Normal compressibility, respiratory phasicity and response to augmentation. Popliteal Vein: No evidence of thrombus. Normal compressibility, respiratory phasicity and response to augmentation. Calf Veins: No evidence of thrombus. Normal compressibility and flow on color Doppler imaging. Other Findings:  None. LEFT LOWER EXTREMITY Common Femoral Vein: No evidence of thrombus. Normal compressibility, respiratory phasicity and response to augmentation. Saphenofemoral Junction: No evidence of thrombus. Normal compressibility and flow on color Doppler imaging. Profunda Femoral Vein: No evidence of thrombus. Normal compressibility and flow on color Doppler imaging. Femoral Vein: No evidence of thrombus. Normal compressibility, respiratory phasicity and response to augmentation. Popliteal Vein: No evidence of thrombus. Normal compressibility, respiratory phasicity and response to augmentation. Calf Veins: No evidence of thrombus. Normal compressibility and flow on color Doppler imaging. Other Findings:  None. IMPRESSION: No evidence of deep venous thrombosis in either lower extremity. Electronically Signed   By: Kennith Center M.D.   On: 06/07/2023 07:51   CT HEAD WO CONTRAST ( ) Result Date: 06/06/2023 CLINICAL DATA:  Delirium EXAM: CT HEAD WITHOUT CONTRAST TECHNIQUE: Contiguous axial images were obtained from the base of the skull through the vertex without intravenous contrast. RADIATION DOSE REDUCTION: This exam was performed according to the departmental dose-optimization program which includes automated exposure control, adjustment of the mA and/or kV according to patient size and/or use of  iterative reconstruction technique. COMPARISON:  CT head 04/29/2023. FINDINGS: Brain: No evidence of acute infarction, hemorrhage, hydrocephalus, extra-axial collection or mass lesion/mass effect. Remote left frontal infarct. Small remote right cerebellar infarct. Vascular: No hyperdense vessel. Skull: No acute fracture. Sinuses/Orbits: Clear sinuses.  No acute orbital findings. Other: No mastoid effusions. IMPRESSION: 1. No evidence of acute intracranial abnormality. 2. Remote left frontal and right cerebellar infarcts. Electronically Signed   By: Feliberto Harts M.D.   On: 06/06/2023 21:34   DG Chest Port 1 View Result Date: 06/06/2023 CLINICAL DATA:  Questionable sepsis EXAM: PORTABLE CHEST 1 VIEW COMPARISON:  Chest x-ray 05/01/2023 FINDINGS: Cardiac silhouette is enlarged, unchanged. The lungs are clear. There is no pleural effusion or pneumothorax. No acute fractures are seen. Orthopedic hardware is seen overlying the mandible. IMPRESSION: 1. No active disease. 2. Stable enlargement of the cardiac silhouette. Electronically Signed   By: Darliss Cheney M.D.   On: 06/06/2023 17:57      Labs: BNP (last 3 results) Recent Labs    03/13/23 2204 04/29/23 1528 06/06/23 1414  BNP 151.3* 152.3* 320.8*   Basic Metabolic Panel: Recent Labs  Lab 06/07/23 0531 06/08/23 0834 06/09/23 0949 06/10/23 0458 06/12/23 0525  NA 137 136 136 138 137  K 3.4* 3.4* 3.4* 3.8 4.3  CL 100 101 99 102 104  CO2 29 28 29 26 27   GLUCOSE 113* 111* 112* 108* 110*  BUN 27* 23 18 18 16   CREATININE 1.25* 0.97 0.89 0.81 0.84  CALCIUM 8.5* 8.8* 8.9 8.7* 8.7*  MG 2.0  --  2.1  --  2.1   Liver Function Tests: Recent Labs  Lab 06/06/23 1414  AST 12*  ALT 5  ALKPHOS 85  BILITOT 1.2  PROT 6.6  ALBUMIN 2.7*   No results for input(s): "LIPASE", "AMYLASE" in the last 168 hours. No results for input(s): "AMMONIA" in the last 168 hours. CBC: Recent Labs  Lab 06/06/23 1414 06/07/23 0531 06/08/23 0834  06/12/23 0525  WBC  23.8* 15.1* 9.5 7.8  NEUTROABS 19.2*  --   --   --   HGB 12.1* 10.1* 11.2* 10.5*  HCT 37.0* 31.4* 33.8* 31.8*  MCV 92.7 94.0 90.6 91.6  PLT 275 214 241 223   Cardiac Enzymes: No results for input(s): "CKTOTAL", "CKMB", "CKMBINDEX", "TROPONINI" in the last 168 hours. BNP: Invalid input(s): "POCBNP" CBG: Recent Labs  Lab 06/12/23 0715 06/12/23 1140 06/12/23 1656 06/12/23 2126 06/13/23 0721  GLUCAP 102* 168* 137* 166* 103*   D-Dimer No results for input(s): "DDIMER" in the last 72 hours. Hgb A1c No results for input(s): "HGBA1C" in the last 72 hours. Lipid Profile No results for input(s): "CHOL", "HDL", "LDLCALC", "TRIG", "CHOLHDL", "LDLDIRECT" in the last 72 hours. Thyroid function studies No results for input(s): "TSH", "T4TOTAL", "T3FREE", "THYROIDAB" in the last 72 hours.  Invalid input(s): "FREET3" Anemia work up No results for input(s): "VITAMINB12", "FOLATE", "FERRITIN", "TIBC", "IRON", "RETICCTPCT" in the last 72 hours. Urinalysis    Component Value Date/Time   COLORURINE YELLOW (A) 06/06/2023 1434   APPEARANCEUR CLOUDY (A) 06/06/2023 1434   LABSPEC 1.012 06/06/2023 1434   PHURINE 6.0 06/06/2023 1434   GLUCOSEU NEGATIVE 06/06/2023 1434   HGBUR MODERATE (A) 06/06/2023 1434   BILIRUBINUR NEGATIVE 06/06/2023 1434   KETONESUR NEGATIVE 06/06/2023 1434   PROTEINUR NEGATIVE 06/06/2023 1434   NITRITE NEGATIVE 06/06/2023 1434   LEUKOCYTESUR LARGE (A) 06/06/2023 1434   Sepsis Labs Recent Labs  Lab 06/06/23 1414 06/07/23 0531 06/08/23 0834 06/12/23 0525  WBC 23.8* 15.1* 9.5 7.8   Microbiology Recent Results (from the past 240 hours)  Urine Culture     Status: Abnormal   Collection Time: 06/06/23  2:34 PM   Specimen: Urine, Random  Result Value Ref Range Status   Specimen Description   Final    URINE, RANDOM Performed at [facility], [address] [place], [place] [address]    Special Requests   Final    NONE Reflexed from  [id number] Performed at [facility], [address] [place], [place] [address]    Culture (A)  Final    >=100,000 COLONIES/mL KLEBSIELLA PNEUMONIAE >=100,000 COLONIES/mL PSEUDOMONAS AERUGINOSA Confirmed Extended Spectrum Beta-Lactamase Producer (ESBL).  In bloodstream infections from ESBL organisms, carbapenems are preferred over piperacillin/tazobactam. They are shown to have a lower risk of mortality.    Report Status 06/09/2023 FINAL  Final   Organism ID, Bacteria PSEUDOMONAS AERUGINOSA (A)  Final   Organism ID, Bacteria KLEBSIELLA PNEUMONIAE (A)  Final      Susceptibility   Klebsiella pneumoniae - MIC*    AMPICILLIN >=32 RESISTANT Resistant     CEFAZOLIN >=64 RESISTANT Resistant     CEFEPIME >=32 RESISTANT Resistant     CEFTRIAXONE >=64 RESISTANT Resistant     CIPROFLOXACIN 1 RESISTANT Resistant     GENTAMICIN >=16 RESISTANT Resistant     IMIPENEM <=0.25 SENSITIVE Sensitive     NITROFURANTOIN 64 INTERMEDIATE Intermediate     TRIMETH/SULFA >=320 RESISTANT Resistant     AMPICILLIN/SULBACTAM >=32 RESISTANT Resistant     PIP/TAZO 16 SENSITIVE Sensitive ug/mL    * >=100,000 COLONIES/mL KLEBSIELLA PNEUMONIAE   Pseudomonas aeruginosa - MIC*    CEFTAZIDIME 2 SENSITIVE Sensitive     CIPROFLOXACIN 0.5 SENSITIVE Sensitive     GENTAMICIN <=1 SENSITIVE Sensitive     IMIPENEM >=16 RESISTANT Resistant     PIP/TAZO 8 SENSITIVE Sensitive ug/mL    CEFEPIME 2 SENSITIVE Sensitive     * >=100,000 COLONIES/mL PSEUDOMONAS AERUGINOSA  Resp panel  by RT-PCR (RSV, Flu A&B, Covid) Anterior Nasal Swab     Status: None   Collection Time: 06/06/23  2:50 PM   Specimen: Anterior Nasal Swab  Result Value Ref Range Status   SARS Coronavirus 2 by RT PCR NEGATIVE NEGATIVE Final    Comment: (NOTE) SARS-CoV-2 target nucleic acids are NOT DETECTED.  The SARS-CoV-2 RNA is generally detectable in upper respiratory specimens during the acute phase of infection. The lowest concentration of SARS-CoV-2  viral copies this assay can detect is 138 copies/mL. A negative result does not preclude SARS-Cov-2 infection and should not be used as the sole basis for treatment or other patient management decisions. A negative result may occur with  improper specimen collection/handling, submission of specimen other than nasopharyngeal swab, presence of viral mutation(s) within the areas targeted by this assay, and inadequate number of viral copies(<138 copies/mL). A negative result must be combined with clinical observations, patient history, and epidemiological information. The expected result is Negative.  Fact Sheet for Patients:  BloggerCourse.com  Fact Sheet for Healthcare Providers:  SeriousBroker.it  This test is no t yet approved or cleared by the [place] FDA and  has been authorized for detection and/or diagnosis of SARS-CoV-2 by FDA under an Emergency Use Authorization (EUA). This EUA will remain  in effect (meaning this test can be used) for the duration of the COVID-19 declaration under Section 564(b)(1) of the Act, 21 U.S.C.section 360bbb-3(b)(1), unless the authorization is terminated  or revoked sooner.       Influenza A by PCR NEGATIVE NEGATIVE Final   Influenza B by PCR NEGATIVE NEGATIVE Final    Comment: (NOTE) The Xpert Xpress SARS-CoV-2/FLU/RSV plus assay is intended as an aid in the diagnosis of influenza from Nasopharyngeal swab specimens and should not be used as a sole basis for treatment. Nasal washings and aspirates are unacceptable for Xpert Xpress SARS-CoV-2/FLU/RSV testing.  Fact Sheet for Patients: BloggerCourse.com  Fact Sheet for Healthcare Providers: SeriousBroker.it  This test is not yet approved or cleared by the [place] FDA and has been authorized for detection and/or diagnosis of SARS-CoV-2 by FDA under an Emergency Use Authorization  (EUA). This EUA will remain in effect (meaning this test can be used) for the duration of the COVID-19 declaration under Section 564(b)(1) of the Act, 21 U.S.C. section 360bbb-3(b)(1), unless the authorization is terminated or revoked.     Resp Syncytial Virus by PCR NEGATIVE NEGATIVE Final    Comment: (NOTE) Fact Sheet for Patients: BloggerCourse.com  Fact Sheet for Healthcare Providers: SeriousBroker.it  This test is not yet approved or cleared by the [place] FDA and has been authorized for detection and/or diagnosis of SARS-CoV-2 by FDA under an Emergency Use Authorization (EUA). This EUA will remain in effect (meaning this test can be used) for the duration of the COVID-19 declaration under Section 564(b)(1) of the Act, 21 U.S.C. section 360bbb-3(b)(1), unless the authorization is terminated or revoked.  Performed at [facility], [address] [place], [place] [address]   Culture, blood (Routine X 2) w Reflex to ID Panel     Status: None   Collection Time: 06/06/23  4:00 PM   Specimen: BLOOD  Result Value Ref Range Status   Specimen Description BLOOD BLOOD RIGHT HAND  Final   Special Requests   Final    BOTTLES DRAWN AEROBIC AND ANAEROBIC Blood Culture results may not be optimal due to an inadequate volume of blood received in culture bottles   Culture  Final    NO GROWTH 5 DAYS Performed at [facility], [address] [place], [place] [address]    Report Status 06/11/2023 FINAL  Final  Culture, blood (Routine X 2) w Reflex to ID Panel     Status: None   Collection Time: 06/06/23  4:07 PM   Specimen: BLOOD  Result Value Ref Range Status   Specimen Description BLOOD BLOOD LEFT HAND  Final   Special Requests   Final    BOTTLES DRAWN AEROBIC AND ANAEROBIC Blood Culture adequate volume   Culture   Final    NO GROWTH 5 DAYS Performed at [facility], [address],  [place], [place] [address]    Report Status 06/11/2023 FINAL  Final  MRSA Next Gen by PCR, Nasal     Status: None   Collection Time: 06/09/23  4:30 AM   Specimen: Nasal Mucosa; Nasal Swab  Result Value Ref Range Status   MRSA by PCR Next Gen NOT DETECTED NOT DETECTED Final    Comment: (NOTE) The GeneXpert MRSA Assay (FDA approved for NASAL specimens only), is one component of a comprehensive MRSA colonization surveillance program. It is not intended to diagnose MRSA infection nor to guide or monitor treatment for MRSA infections. Test performance is not FDA approved in patients less than 23 years old. Performed at [facility], [address] [place], [place] [address]      Total time spend on discharging this patient, including the last patient exam, discussing the hospital stay, instructions for ongoing care as it relates to all pertinent caregivers, as well as preparing the medical discharge records, prescriptions, and/or referrals as applicable, is 45 minutes.    [name], MD  [facility] 06/13/2023, 11:48 AM

## 2023-06-13 NOTE — Plan of Care (Signed)

## 2023-06-13 NOTE — Plan of Care (Signed)
 Problem: Education: Goal: Ability to describe self-care measures that may prevent or decrease complications (Diabetes Survival Skills Education) will improve Outcome: Adequate for Discharge Goal: Individualized Educational Video(s) Outcome: Adequate for Discharge   Problem: Coping: Goal: Ability to adjust to condition or change in health will improve Outcome: Adequate for Discharge   Problem: Fluid Volume: Goal: Ability to maintain a balanced intake and output will improve Outcome: Adequate for Discharge   Problem: Health Behavior/Discharge Planning: Goal: Ability to identify and utilize available resources and services will improve Outcome: Adequate for Discharge Goal: Ability to manage health-related needs will improve Outcome: Adequate for Discharge   Problem: Metabolic: Goal: Ability to maintain appropriate glucose levels will improve Outcome: Adequate for Discharge   Problem: Nutritional: Goal: Maintenance of adequate nutrition will improve Outcome: Adequate for Discharge Goal: Progress toward achieving an optimal weight will improve Outcome: Adequate for Discharge   Problem: Skin Integrity: Goal: Risk for impaired skin integrity will decrease Outcome: Adequate for Discharge   Problem: Tissue Perfusion: Goal: Adequacy of tissue perfusion will improve Outcome: Adequate for Discharge   Problem: Education: Goal: Knowledge of General Education information will improve Description: Including pain rating scale, medication(s)/side effects and non-pharmacologic comfort measures Outcome: Adequate for Discharge   Problem: Health Behavior/Discharge Planning: Goal: Ability to manage health-related needs will improve Outcome: Adequate for Discharge   Problem: Clinical Measurements: Goal: Ability to maintain clinical measurements within normal limits will improve Outcome: Adequate for Discharge Goal: Will remain free from infection Outcome: Adequate for Discharge Goal:  Diagnostic test results will improve Outcome: Adequate for Discharge Goal: Respiratory complications will improve Outcome: Adequate for Discharge Goal: Cardiovascular complication will be avoided Outcome: Adequate for Discharge   Problem: Activity: Goal: Risk for activity intolerance will decrease Outcome: Adequate for Discharge   Problem: Nutrition: Goal: Adequate nutrition will be maintained Outcome: Adequate for Discharge   Problem: Coping: Goal: Level of anxiety will decrease Outcome: Adequate for Discharge   Problem: Elimination: Goal: Will not experience complications related to bowel motility Outcome: Adequate for Discharge Goal: Will not experience complications related to urinary retention Outcome: Adequate for Discharge   Problem: Pain Managment: Goal: General experience of comfort will improve and/or be controlled Outcome: Adequate for Discharge   Problem: Safety: Goal: Ability to remain free from injury will improve Outcome: Adequate for Discharge   Problem: Skin Integrity: Goal: Risk for impaired skin integrity will decrease Outcome: Adequate for Discharge   Problem: Education: Goal: Ability to demonstrate management of disease process will improve Outcome: Adequate for Discharge Goal: Ability to verbalize understanding of medication therapies will improve Outcome: Adequate for Discharge Goal: Individualized Educational Video(s) Outcome: Adequate for Discharge   Problem: Activity: Goal: Capacity to carry out activities will improve Outcome: Adequate for Discharge   Problem: Cardiac: Goal: Ability to achieve and maintain adequate cardiopulmonary perfusion will improve Outcome: Adequate for Discharge   Problem: Education: Goal: Knowledge of disease or condition will improve Outcome: Adequate for Discharge Goal: Knowledge of the prescribed therapeutic regimen will improve Outcome: Adequate for Discharge Goal: Individualized Educational  Video(s) Outcome: Adequate for Discharge   Problem: Activity: Goal: Ability to tolerate increased activity will improve Outcome: Adequate for Discharge Goal: Will verbalize the importance of balancing activity with adequate rest periods Outcome: Adequate for Discharge   Problem: Respiratory: Goal: Ability to maintain a clear airway will improve Outcome: Adequate for Discharge Goal: Levels of oxygenation will improve Outcome: Adequate for Discharge Goal: Ability to maintain adequate ventilation will improve Outcome: Adequate for Discharge

## 2023-06-13 NOTE — Progress Notes (Signed)
 Nursing Discharge Note  Name: Raymond Hays MRN: [id number] DOB: [birth year]  Admit Date: 06/06/2023 Discharge Date: 06/13/2023  Clyda Greener to be discharged to a Skilled Nursing Facility per MD order.  AVS completed, placed in discharge packet for facility review. Discharge packet compiled for facility. Non-emergency ambulance transport arranged. Report called to  Reggie LPN, [facility] at [phone].   Discharge Instructions   None      Allergies as of 06/13/2023   No Known Allergies      Medication List     STOP taking these medications    cetirizine 5 MG tablet Commonly known as: ZYRTEC   citalopram 10 MG tablet Commonly known as: CELEXA   famotidine 20 MG tablet Commonly known as: PEPCID   melatonin 5 MG Tabs   nortriptyline 10 MG capsule Commonly known as: PAMELOR   selenium sulfide 1 % Lotn Commonly known as: SELSUN   silver sulfADIAZINE 1 % cream Commonly known as: SILVADENE   tacrolimus 0.03 % ointment Commonly known as: PROTOPIC   terbinafine 1 % cream Commonly known as: LAMISIL       TAKE these medications    acetaminophen 325 MG tablet Commonly known as: TYLENOL Take 2 tablets (650 mg total) by mouth every 6 (six) hours as needed for mild pain (pain score 1-3), fever or moderate pain (pain score 4-6).   Acidophilus Probiotic Blend Caps Take 1 capsule by mouth daily.   Albuterol Sulfate 108 (90 Base) MCG/ACT Aepb Commonly known as: PROAIR RESPICLICK Inhale 2 puffs into the lungs every 4 (four) hours as needed (COPD).   allopurinol 100 MG tablet Commonly known as: ZYLOPRIM Take 200 mg by mouth daily.   apixaban 5 MG Tabs tablet Commonly known as: ELIQUIS Take 5 mg by mouth 2 (two) times daily.   ascorbic acid 500 MG tablet Commonly known as: VITAMIN C Take 500 mg by mouth daily.   atorvastatin 20 MG tablet Commonly known as: LIPITOR Take 20 mg by mouth at bedtime.   Biofreeze 4 % Gel Generic drug: Menthol (Topical  Analgesic) Apply 1 application  topically 3 (three) times daily. (Apply to neck)   bisacodyl 10 MG suppository Commonly known as: DULCOLAX Place 10 mg rectally every 3 (three) days. (HOLD if having loose stools)   calcium carbonate 500 MG chewable tablet Commonly known as: TUMS - dosed in mg elemental calcium Chew 2 tablets by mouth daily as needed for indigestion or heartburn.   carbidopa-levodopa 25-100 MG tablet Commonly known as: SINEMET IR Take 1 tablet by mouth 3 (three) times daily before meals.   cetaphil cream Apply 1 Application topically 2 (two) times daily as needed.   dextromethorphan-guaiFENesin 30-600 MG 12hr tablet Commonly known as: MUCINEX DM Take 1 tablet by mouth 2 (two) times daily as needed for cough.   diltiazem 240 MG 24 hr capsule Commonly known as: CARDIZEM CD Take 1 capsule (240 mg total) by mouth daily.   docusate sodium 100 MG capsule Commonly known as: COLACE Take 100 mg by mouth daily.   feeding supplement Liqd Take 237 mLs by mouth 2 (two) times daily between meals.   ferrous sulfate 324 (65 Fe) MG Tbec Take 1 tablet by mouth daily.   fluticasone 50 MCG/ACT nasal spray Commonly known as: FLONASE Place 2 sprays into both nostrils daily.   Fluticasone-Umeclidin-Vilant 100-62.5-25 MCG/ACT Aepb Inhale 1 puff into the lungs daily.   furosemide 20 MG tablet Commonly known as: LASIX Take 2 tablets (40 mg total) by  mouth daily.   gabapentin 800 MG tablet Commonly known as: NEURONTIN Take 800 mg by mouth 4 (four) times daily.   hydrocortisone 25 MG suppository Commonly known as: ANUSOL-HC Place 25 mg rectally every 4 (four) hours as needed for hemorrhoids or anal itching.   hydrOXYzine 25 MG tablet Commonly known as: ATARAX Take 25 mg by mouth every 6 (six) hours as needed for itching.   insulin glargine-yfgn 100 UNIT/ML injection Commonly known as: SEMGLEE Inject 0.1 mLs (10 Units total) into the skin daily.   ipratropium 0.06 %  nasal spray Commonly known as: ATROVENT Place 2 sprays into both nostrils 3 (three) times daily.   ipratropium-albuterol 0.5-2.5 (3) MG/3ML Soln Commonly known as: DUONEB Take 3 mLs by nebulization every 6 (six) hours.   lactase 3000 units tablet Commonly known as: LACTAID Take 3,000 Units by mouth 3 (three) times daily with meals.   lactulose 10 GM/15ML solution Commonly known as: CHRONULAC Take 45 mLs (30 g total) by mouth daily.   levothyroxine 75 MCG tablet Commonly known as: SYNTHROID Take 75 mcg by mouth daily before breakfast.   lidocaine 4 % Place 1 patch onto the skin daily as needed (pain). (Remove after 12 hours)   liver oil-zinc oxide 40 % ointment Commonly known as: DESITIN Apply topically 2 (two) times daily. On skin around suprapubic cathether   Metamucil Smooth Texture 58.6 % powder Generic drug: psyllium Take 1 packet by mouth 3 (three) times daily. What changed: Another medication with the same name was removed. Continue taking this medication, and follow the directions you see here.   methocarbamol 500 MG tablet Commonly known as: ROBAXIN Take 1,000 mg by mouth every 12 (twelve) hours. At 1400   metoprolol tartrate 25 MG tablet Commonly known as: LOPRESSOR Take 0.5 tablets (12.5 mg total) by mouth 2 (two) times daily. Reduced from 50 mg. What changed:  medication strength how much to take additional instructions   midodrine 2.5 MG tablet Commonly known as: PROAMATINE Take 1 tablet (2.5 mg total) by mouth 3 (three) times daily with meals.   mirabegron ER 25 MG Tb24 tablet Commonly known as: MYRBETRIQ Take 1 tablet (25 mg total) by mouth daily.   OcuSoft Lid Scrub Original Pads Place 1 Application into both eyes daily. Wipe both eyelids once per day.   OcuSoft Lid Scrub Original Pads Apply 1 Pad topically every 12 (twelve) hours as needed.   oxybutynin 5 MG tablet Commonly known as: DITROPAN Take 5 mg by mouth 2 (two) times daily.    pantoprazole 40 MG tablet Commonly known as: PROTONIX Take 1 tablet (40 mg total) by mouth daily.   polyethylene glycol 17 g packet Commonly known as: MIRALAX / GLYCOLAX Take 17 g by mouth daily.   pramoxine 1 % Lotn Commonly known as: SARNA SENSITIVE Apply 1 Application topically 4 (four) times daily as needed.   Propylene Glycol 0.6 % Soln Place 2 drops into both eyes in the morning, at noon, and at bedtime.   senna 8.6 MG Tabs tablet Commonly known as: SENOKOT Take 1 tablet (8.6 mg total) by mouth daily as needed for mild constipation.   sodium chloride 0.65 % Soln nasal spray Commonly known as: OCEAN Place 2 sprays into both nostrils every 6 (six) hours as needed for congestion.   sodium phosphate Enem Place 133 mLs (1 enema total) rectally daily as needed for severe constipation.   tiZANidine 2 MG tablet Commonly known as: ZANAFLEX Take 2 mg by mouth 2 (  two) times daily as needed for muscle spasms.   triamcinolone cream 0.1 % Commonly known as: KENALOG Apply 1 Application topically daily as needed. Apply to arms, legs, and back r/t rash   Vitamin D3 50 MCG (2000 UT) Tabs Take 1 tablet by mouth daily.               Discharge Care Instructions  (From admission, onward)           Start     Ordered   06/13/23 0000  Discharge wound care:       Comments: Every shift      Cleanse around suprapubic catheter with Vashe wound cleanser Hart Rochester [phone]), do not rinse and allow to air dry.  Apply a thin layer of Desitin to irritated skin surrounding SP catheter.  May cut a piece of silver hydrofiber (Aquacel AG Hart Rochester [phone]) like a split gauze and put around [facility] catheter/under bumper.  If more absorption is required can try Drawtex (superabsorbent dressing) around SP catheter Hart Rochester #[id number], and towels. - -   06/13/23 1148             Discharge Instructions     Discharge wound care:   Complete by: As directed    Every shift      Cleanse around  suprapubic catheter with Vashe wound cleanser Hart Rochester [phone]), do not rinse and allow to air dry.  Apply a thin layer of Desitin to irritated skin surrounding SP catheter.  May cut a piece of silver hydrofiber (Aquacel AG Hart Rochester [phone]) like a split gauze and put around [facility] catheter/under bumper.  If more absorption is required can try Drawtex (superabsorbent dressing) around SP catheter Hart Rochester #[id number], and towels. - -         [facility] to provide transportation to facility for patient. Non-emergency ambulance transport at bedside. Handoff completed with [facility] staff/EMTs.  Patient discharged from hospital unit via stretcher. Stable at time of discharge.

## 2023-06-13 NOTE — TOC Transition Note (Addendum)
 Transition of Care [facility]) - Discharge Note   Patient Details  Name: Raymond Hays MRN: [id number] Date of Birth: 01-May-1945  [facility] CM/SW Contact:  Liliana Cline, LCSW Phone Number: 06/13/2023, 10:13 AM   Clinical Narrative:    Discharge to [facility] today. Room 313. Confirmed with Admissions Worker Gavin Pound who also confirmed [facility]. Updated MD, RN, and patient. Patient declined CSW updating any family. Asked RN to call report. EMS paperwork completed. Will arrange EMS when patient is ready for transport.  12:55- [facility] transport arranged. Patient is next on the list.    Final next level of care: Skilled Nursing Facility Barriers to Discharge: Barriers Resolved   Patient Goals and CMS Choice Patient states their goals for this hospitalization and ongoing recovery are:: return to [email or web address].gov Compare Post Acute Care list provided to:: Patient Choice offered to / list presented to : Patient      Discharge Placement              Patient chooses bed at: [facility]   Name of family member notified: patient Patient and family notified of of transfer: 06/13/23  Discharge Plan and Services Additional resources added to the After Visit Summary for                                       Social Drivers of Health (SDOH) Interventions SDOH Screenings   Food Insecurity: No Food Insecurity (06/08/2023)  Housing: Low Risk  (06/11/2023)  Transportation Needs: No Transportation Needs (06/08/2023)  Utilities: Not At Risk (06/08/2023)  Depression (PHQ2-9): Low Risk  (09/25/2022)  Financial Resource Strain: Low Risk  (05/02/2023)  Social Connections: Socially Isolated (06/11/2023)  Tobacco Use: Medium Risk (06/06/2023)     Readmission Risk Interventions    03/12/2023    2:19 PM 01/12/2021    1:08 PM 10/20/2020    8:59 AM  Readmission Risk Prevention Plan  Transportation Screening  Complete Complete   PCP or Specialist Appt within 3-5 Days   Complete  HRI or Home Care Consult   Complete  Social Work Consult for Recovery Care Planning/Counseling     Palliative Care Screening   Not Applicable  Medication Review Oceanographer)  Complete Referral to Pharmacy  PCP or Specialist appointment within 3-5 days of discharge  Complete   HRI or Home Care Consult  Complete   SW Recovery Care/Counseling Consult  Complete   Palliative Care Screening  Not Applicable   Skilled Nursing Facility  Complete      Information is confidential and restricted. Go to Review Flowsheets to unlock data.

## 2023-06-16 NOTE — Progress Notes (Deleted)
 Referring Physician:  Pearson Grippe, MD [address] [place],  [place] [address]  Primary Physician:  Pearson Grippe, MD  History of Present Illness: 06/16/2023 [name] is here today with a chief complaint of ***  Neck pain with turning head to the left.    Duration: *** Location: *** Quality: *** Severity: ***  Precipitating: aggravated by *** Modifying factors: made better by *** Weakness: none Timing: *** Bowel/Bladder Dysfunction: none  Conservative measures:  Physical therapy: *** has not participated in PT Multimodal medical therapy including regular antiinflammatories: Tylenol, Gabapentin, Robaxin, Nortriptyline Injections: *** epidural steroid injections  Past Surgery: 1967 L4-5 Lumbar surgery C3-C7 ACDF  Raymond Hays has ***no symptoms of cervical myelopathy.  The symptoms are causing a significant impact on the patient's life.   I have utilized the care everywhere function in epic to review the outside records available from external health systems.  Review of Systems:  A 10 point review of systems is negative, except for the pertinent positives and negatives detailed in the HPI.  Past Medical History: Past Medical History:  Diagnosis Date   Allergic rhinitis    Anxiety    Arthritis    Atrial fibrillation (HCC)    Chronic indwelling Foley catheter    COPD (chronic obstructive pulmonary disease) (HCC)    Cough    Diabetes (HCC)    Essential (primary) hypertension    Hemorrhoid    Hyperlipidemia    PAF (paroxysmal atrial fibrillation) (HCC)    Primary osteoarthritis, unspecified shoulder    Retention of urine, unspecified    Spinal stenosis    UTI (urinary tract infection)     Past Surgical History: Past Surgical History:  Procedure Laterality Date   APPENDECTOMY     BACK SURGERY     COLONOSCOPY N/A 10/27/2020   Procedure: COLONOSCOPY;  Surgeon: Wyline Mood, MD;  Location: [facility] ENDOSCOPY;  Service: Gastroenterology;   Laterality: N/A;   ELBOW SURGERY     ESOPHAGOGASTRODUODENOSCOPY (EGD) WITH PROPOFOL N/A 10/27/2020   Procedure: ESOPHAGOGASTRODUODENOSCOPY (EGD) WITH PROPOFOL;  Surgeon: Wyline Mood, MD;  Location: [facility] ENDOSCOPY;  Service: Gastroenterology;  Laterality: N/A;   HERNIA REPAIR     KNEE SURGERY     neck     PARTIAL HIP ARTHROPLASTY Bilateral    TEE WITHOUT CARDIOVERSION N/A 10/19/2020   Procedure: TRANSESOPHAGEAL ECHOCARDIOGRAM (TEE);  Surgeon: Antonieta Iba, MD;  Location: [facility] ORS;  Service: Cardiovascular;  Laterality: N/A;   TONSILLECTOMY      Allergies: Allergies as of 06/17/2023   (No Known Allergies)    Medications:  Current Outpatient Medications:    acetaminophen (TYLENOL) 325 MG tablet, Take 2 tablets (650 mg total) by mouth every 6 (six) hours as needed for mild pain (pain score 1-3), fever or moderate pain (pain score 4-6)., Disp: , Rfl:    Albuterol Sulfate (PROAIR RESPICLICK) 108 (90 Base) MCG/ACT AEPB, Inhale 2 puffs into the lungs every 4 (four) hours as needed (COPD)., Disp: , Rfl:    allopurinol (ZYLOPRIM) 100 MG tablet, Take 200 mg by mouth daily., Disp: , Rfl:    apixaban (ELIQUIS) 5 MG TABS tablet, Take 5 mg by mouth 2 (two) times daily. , Disp: , Rfl:    ascorbic acid (VITAMIN C) 500 MG tablet, Take 500 mg by mouth daily., Disp: , Rfl:    atorvastatin (LIPITOR) 20 MG tablet, Take 20 mg by mouth at bedtime., Disp: , Rfl:    bisacodyl (DULCOLAX) 10 MG suppository, Place 10  mg rectally every 3 (three) days. (HOLD if having loose stools), Disp: , Rfl:    calcium carbonate (TUMS - DOSED IN MG ELEMENTAL CALCIUM) 500 MG chewable tablet, Chew 2 tablets by mouth daily as needed for indigestion or heartburn., Disp: , Rfl:    carbidopa-levodopa (SINEMET IR) 25-100 MG tablet, Take 1 tablet by mouth 3 (three) times daily before meals., Disp: , Rfl:    Cholecalciferol (VITAMIN D3) 50 MCG (2000 UT) TABS, Take 1 tablet by mouth daily., Disp: , Rfl:    dextromethorphan-guaiFENesin  (MUCINEX DM) 30-600 MG 12hr tablet, Take 1 tablet by mouth 2 (two) times daily as needed for cough., Disp: , Rfl:    diltiazem (CARDIZEM CD) 240 MG 24 hr capsule, Take 1 capsule (240 mg total) by mouth daily., Disp: , Rfl:    docusate sodium (COLACE) 100 MG capsule, Take 100 mg by mouth daily., Disp: , Rfl:    Emollient (CETAPHIL) cream, Apply 1 Application topically 2 (two) times daily as needed., Disp: , Rfl:    Eyelid Cleansers (OCUSOFT LID SCRUB ORIGINAL) PADS, Place 1 Application into both eyes daily. Wipe both eyelids once per day., Disp: , Rfl:    Eyelid Cleansers (OCUSOFT LID SCRUB ORIGINAL) PADS, Apply 1 Pad topically every 12 (twelve) hours as needed., Disp: , Rfl:    feeding supplement (ENSURE ENLIVE / ENSURE PLUS) LIQD, Take 237 mLs by mouth 2 (two) times daily between meals., Disp: , Rfl:    ferrous sulfate 324 (65 Fe) MG TBEC, Take 1 tablet by mouth daily., Disp: , Rfl:    fluticasone (FLONASE) 50 MCG/ACT nasal spray, Place 2 sprays into both nostrils daily., Disp: , Rfl:    Fluticasone-Umeclidin-Vilant 100-62.5-25 MCG/ACT AEPB, Inhale 1 puff into the lungs daily., Disp: , Rfl:    furosemide (LASIX) 20 MG tablet, Take 2 tablets (40 mg total) by mouth daily., Disp: , Rfl:    gabapentin (NEURONTIN) 800 MG tablet, Take 800 mg by mouth 4 (four) times daily., Disp: , Rfl:    hydrocortisone (ANUSOL-HC) 25 MG suppository, Place 25 mg rectally every 4 (four) hours as needed for hemorrhoids or anal itching., Disp: , Rfl:    hydrOXYzine (ATARAX/VISTARIL) 25 MG tablet, Take 25 mg by mouth every 6 (six) hours as needed for itching., Disp: , Rfl:    insulin glargine-yfgn (SEMGLEE) 100 UNIT/ML injection, Inject 0.1 mLs (10 Units total) into the skin daily., Disp: , Rfl:    ipratropium (ATROVENT) 0.06 % nasal spray, Place 2 sprays into both nostrils 3 (three) times daily., Disp: , Rfl:    ipratropium-albuterol (DUONEB) 0.5-2.5 (3) MG/3ML SOLN, Take 3 mLs by nebulization every 6 (six) hours., Disp: ,  Rfl:    lactase (LACTAID) 3000 units tablet, Take 3,000 Units by mouth 3 (three) times daily with meals., Disp: , Rfl:    lactulose (CHRONULAC) 10 GM/15ML solution, Take 45 mLs (30 g total) by mouth daily., Disp: , Rfl:    levothyroxine (SYNTHROID) 75 MCG tablet, Take 75 mcg by mouth daily before breakfast., Disp: , Rfl:    lidocaine 4 %, Place 1 patch onto the skin daily as needed (pain). (Remove after 12 hours), Disp: , Rfl:    liver oil-zinc oxide (DESITIN) 40 % ointment, Apply topically 2 (two) times daily. On skin around suprapubic cathether, Disp: , Rfl:    Menthol, Topical Analgesic, (BIOFREEZE) 4 % GEL, Apply 1 application  topically 3 (three) times daily. (Apply to neck), Disp: , Rfl:    methocarbamol (ROBAXIN) 500 MG  tablet, Take 1,000 mg by mouth every 12 (twelve) hours. At 1400, Disp: , Rfl:    metoprolol tartrate (LOPRESSOR) 25 MG tablet, Take 0.5 tablets (12.5 mg total) by mouth 2 (two) times daily. Reduced from 50 mg., Disp: , Rfl:    midodrine (PROAMATINE) 2.5 MG tablet, Take 1 tablet (2.5 mg total) by mouth 3 (three) times daily with meals., Disp: , Rfl:    mirabegron ER (MYRBETRIQ) 25 MG TB24 tablet, Take 1 tablet (25 mg total) by mouth daily., Disp: , Rfl:    oxybutynin (DITROPAN) 5 MG tablet, Take 5 mg by mouth 2 (two) times daily., Disp: , Rfl:    pantoprazole (PROTONIX) 40 MG tablet, Take 1 tablet (40 mg total) by mouth daily., Disp: , Rfl:    polyethylene glycol (MIRALAX / GLYCOLAX) 17 g packet, Take 17 g by mouth daily., Disp: , Rfl:    pramoxine (SARNA SENSITIVE) 1 % LOTN, Apply 1 Application topically 4 (four) times daily as needed., Disp: , Rfl:    Probiotic Product (ACIDOPHILUS PROBIOTIC BLEND) CAPS, Take 1 capsule by mouth daily., Disp: , Rfl:    Propylene Glycol 0.6 % SOLN, Place 2 drops into both eyes in the morning, at noon, and at bedtime., Disp: , Rfl:    psyllium (METAMUCIL SMOOTH TEXTURE) 58.6 % powder, Take 1 packet by mouth 3 (three) times daily., Disp: 283 g,  Rfl: 1   senna (SENOKOT) 8.6 MG TABS tablet, Take 1 tablet (8.6 mg total) by mouth daily as needed for mild constipation., Disp: 120 tablet, Rfl: 0   sodium chloride (OCEAN) 0.65 % SOLN nasal spray, Place 2 sprays into both nostrils every 6 (six) hours as needed for congestion., Disp: , Rfl:    sodium phosphate (FLEET) ENEM, Place 133 mLs (1 enema total) rectally daily as needed for severe constipation., Disp: , Rfl:    tiZANidine (ZANAFLEX) 2 MG tablet, Take 2 mg by mouth 2 (two) times daily as needed for muscle spasms., Disp: , Rfl:    triamcinolone cream (KENALOG) 0.1 %, Apply 1 Application topically daily as needed. Apply to arms, legs, and back r/t rash, Disp: , Rfl:   Social History: Social History   Tobacco Use   Smoking status: Former    Current packs/day: 0.00    Average packs/day: 0.5 packs/day for 10.0 years (5.0 ttl pk-yrs)    Types: Cigarettes    Start date: 43    Quit date: 2006    Years since quitting: 19.2   Smokeless tobacco: Never  Vaping Use   Vaping status: Never Used  Substance Use Topics   Alcohol use: Not Currently   Drug use: Not Currently    Types: "Crack" cocaine    Comment: quit "crack" 15 years ago--11/04/2019    Family Medical History: Family History  Problem Relation Age of Onset   Healthy Son    Healthy Son     Physical Examination: There were no vitals filed for this visit.  General: Patient is in no apparent distress. Attention to examination is appropriate.  Neck:   Supple.  Full range of motion.  Respiratory: Patient is breathing without any difficulty.   NEUROLOGICAL:     Awake, alert, oriented to person, place, and time.  Speech is clear and fluent.   Cranial Nerves: Pupils equal round and reactive to light.  Facial tone is symmetric.  Facial sensation is symmetric. Shoulder shrug is symmetric. Tongue protrusion is midline.  There is no pronator drift.  Strength: Side Biceps Triceps  Deltoid Interossei Grip Wrist Ext. Wrist Flex.   R 5 5 5 5 5 5 5   L 5 5 5 5 5 5 5    Side Iliopsoas Quads Hamstring PF DF EHL  R 5 5 5 5 5 5   L 5 5 5 5 5 5    Reflexes are ***2+ and symmetric at the biceps, triceps, brachioradialis, patella and Raymond.   Hoffman's is absent.   Bilateral upper and lower extremity sensation is intact to light touch.    No evidence of dysmetria noted.  Gait is normal.     Medical Decision Making  Imaging: ***  I have personally reviewed the images and agree with the above interpretation.  Assessment and Plan: [name] is a pleasant 78 y.o. male with ***    Thank you for involving me in the care of this patient.      Chester K. Myer Haff MD, [facility] Neurosurgery

## 2023-06-17 ENCOUNTER — Ambulatory Visit: Admitting: Neurosurgery

## 2023-06-24 ENCOUNTER — Encounter: Payer: Self-pay | Admitting: Neurosurgery

## 2023-06-24 ENCOUNTER — Ambulatory Visit (INDEPENDENT_AMBULATORY_CARE_PROVIDER_SITE_OTHER): Admitting: Neurosurgery

## 2023-06-24 VITALS — BP 118/62 | Ht 67.0 in | Wt 212.0 lb

## 2023-06-24 DIAGNOSIS — M4802 Spinal stenosis, cervical region: Secondary | ICD-10-CM

## 2023-06-24 DIAGNOSIS — M542 Cervicalgia: Secondary | ICD-10-CM | POA: Diagnosis not present

## 2023-06-24 DIAGNOSIS — G959 Disease of spinal cord, unspecified: Secondary | ICD-10-CM

## 2023-06-24 NOTE — Progress Notes (Signed)
 Referring Physician:  Pearson Grippe, MD [address] [place],  [place] [address]  Primary Physician:  Pearson Grippe, MD  History of Present Illness: 06/24/2023 [name] is here today with a chief complaint of neck pain.  He has had neck pain for many years.  He has difficulty turning his head to the left.  He has numbness, weakness, and tingling from the waist down.  He has not walked in years.  He has not had any significant change in his condition recently.  He has been to the hospital recently.  Conservative measures:  Physical therapy:  has not participated in PT Multimodal medical therapy including regular antiinflammatories: Tylenol, Gabapentin, Robaxin, Nortriptyline Injections:  epidural steroid injections  Past Surgery: 1967 L4-5 Lumbar surgery C3-C7 ACDF   Raymond Hays has symptoms of cervical myelopathy.  The symptoms are causing a significant impact on the patient's life.   I have utilized the care everywhere function in epic to review the outside records available from external health systems.  Review of Systems:  A 10 point review of systems is negative, except for the pertinent positives and negatives detailed in the HPI.  Past Medical History: Past Medical History:  Diagnosis Date   Allergic rhinitis    Anxiety    Arthritis    Atrial fibrillation (HCC)    Chronic indwelling Foley catheter    COPD (chronic obstructive pulmonary disease) (HCC)    Cough    Diabetes (HCC)    Essential (primary) hypertension    Hemorrhoid    Hyperlipidemia    PAF (paroxysmal atrial fibrillation) (HCC)    Primary osteoarthritis, unspecified shoulder    Retention of urine, unspecified    Spinal stenosis    UTI (urinary tract infection)     Past Surgical History: Past Surgical History:  Procedure Laterality Date   APPENDECTOMY     BACK SURGERY     COLONOSCOPY N/A 10/27/2020   Procedure: COLONOSCOPY;  Surgeon: Wyline Mood, MD;  Location: [facility]  ENDOSCOPY;  Service: Gastroenterology;  Laterality: N/A;   ELBOW SURGERY     ESOPHAGOGASTRODUODENOSCOPY (EGD) WITH PROPOFOL N/A 10/27/2020   Procedure: ESOPHAGOGASTRODUODENOSCOPY (EGD) WITH PROPOFOL;  Surgeon: Wyline Mood, MD;  Location: [facility] ENDOSCOPY;  Service: Gastroenterology;  Laterality: N/A;   HERNIA REPAIR     KNEE SURGERY     neck     PARTIAL HIP ARTHROPLASTY Bilateral    TEE WITHOUT CARDIOVERSION N/A 10/19/2020   Procedure: TRANSESOPHAGEAL ECHOCARDIOGRAM (TEE);  Surgeon: Antonieta Iba, MD;  Location: [facility] ORS;  Service: Cardiovascular;  Laterality: N/A;   TONSILLECTOMY      Allergies: Allergies as of 06/17/2023   (No Known Allergies)    Medications:  Current Outpatient Medications:    acetaminophen (TYLENOL) 325 MG tablet, Take 2 tablets (650 mg total) by mouth every 6 (six) hours as needed for mild pain (pain score 1-3), fever or moderate pain (pain score 4-6)., Disp: , Rfl:    Albuterol Sulfate (PROAIR RESPICLICK) 108 (90 Base) MCG/ACT AEPB, Inhale 2 puffs into the lungs every 4 (four) hours as needed (COPD)., Disp: , Rfl:    allopurinol (ZYLOPRIM) 100 MG tablet, Take 200 mg by mouth daily., Disp: , Rfl:    apixaban (ELIQUIS) 5 MG TABS tablet, Take 5 mg by mouth 2 (two) times daily. , Disp: , Rfl:    ascorbic acid (VITAMIN C) 500 MG tablet, Take 500 mg by mouth daily., Disp: , Rfl:    atorvastatin (LIPITOR) 20 MG tablet,  Take 20 mg by mouth at bedtime., Disp: , Rfl:    bisacodyl (DULCOLAX) 10 MG suppository, Place 10 mg rectally every 3 (three) days. (HOLD if having loose stools), Disp: , Rfl:    calcium carbonate (TUMS - DOSED IN MG ELEMENTAL CALCIUM) 500 MG chewable tablet, Chew 2 tablets by mouth daily as needed for indigestion or heartburn., Disp: , Rfl:    carbidopa-levodopa (SINEMET IR) 25-100 MG tablet, Take 1 tablet by mouth 3 (three) times daily before meals., Disp: , Rfl:    Cholecalciferol (VITAMIN D3) 50 MCG (2000 UT) TABS, Take 1 tablet by mouth daily., Disp:  , Rfl:    dextromethorphan-guaiFENesin (MUCINEX DM) 30-600 MG 12hr tablet, Take 1 tablet by mouth 2 (two) times daily as needed for cough., Disp: , Rfl:    diltiazem (CARDIZEM CD) 240 MG 24 hr capsule, Take 1 capsule (240 mg total) by mouth daily., Disp: , Rfl:    docusate sodium (COLACE) 100 MG capsule, Take 100 mg by mouth daily., Disp: , Rfl:    Emollient (CETAPHIL) cream, Apply 1 Application topically 2 (two) times daily as needed., Disp: , Rfl:    Eyelid Cleansers (OCUSOFT LID SCRUB ORIGINAL) PADS, Place 1 Application into both eyes daily. Wipe both eyelids once per day., Disp: , Rfl:    Eyelid Cleansers (OCUSOFT LID SCRUB ORIGINAL) PADS, Apply 1 Pad topically every 12 (twelve) hours as needed., Disp: , Rfl:    feeding supplement (ENSURE ENLIVE / ENSURE PLUS) LIQD, Take 237 mLs by mouth 2 (two) times daily between meals., Disp: , Rfl:    ferrous sulfate 324 (65 Fe) MG TBEC, Take 1 tablet by mouth daily., Disp: , Rfl:    fluticasone (FLONASE) 50 MCG/ACT nasal spray, Place 2 sprays into both nostrils daily., Disp: , Rfl:    Fluticasone-Umeclidin-Vilant 100-62.5-25 MCG/ACT AEPB, Inhale 1 puff into the lungs daily., Disp: , Rfl:    furosemide (LASIX) 20 MG tablet, Take 2 tablets (40 mg total) by mouth daily., Disp: , Rfl:    gabapentin (NEURONTIN) 800 MG tablet, Take 800 mg by mouth 4 (four) times daily., Disp: , Rfl:    hydrocortisone (ANUSOL-HC) 25 MG suppository, Place 25 mg rectally every 4 (four) hours as needed for hemorrhoids or anal itching., Disp: , Rfl:    hydrOXYzine (ATARAX/VISTARIL) 25 MG tablet, Take 25 mg by mouth every 6 (six) hours as needed for itching., Disp: , Rfl:    insulin glargine-yfgn (SEMGLEE) 100 UNIT/ML injection, Inject 0.1 mLs (10 Units total) into the skin daily., Disp: , Rfl:    ipratropium (ATROVENT) 0.06 % nasal spray, Place 2 sprays into both nostrils 3 (three) times daily., Disp: , Rfl:    ipratropium-albuterol (DUONEB) 0.5-2.5 (3) MG/3ML SOLN, Take 3 mLs by  nebulization every 6 (six) hours., Disp: , Rfl:    lactase (LACTAID) 3000 units tablet, Take 3,000 Units by mouth 3 (three) times daily with meals., Disp: , Rfl:    lactulose (CHRONULAC) 10 GM/15ML solution, Take 45 mLs (30 g total) by mouth daily., Disp: , Rfl:    levothyroxine (SYNTHROID) 75 MCG tablet, Take 75 mcg by mouth daily before breakfast., Disp: , Rfl:    lidocaine 4 %, Place 1 patch onto the skin daily as needed (pain). (Remove after 12 hours), Disp: , Rfl:    liver oil-zinc oxide (DESITIN) 40 % ointment, Apply topically 2 (two) times daily. On skin around suprapubic cathether, Disp: , Rfl:    Menthol, Topical Analgesic, (BIOFREEZE) 4 % GEL, Apply 1  application  topically 3 (three) times daily. (Apply to neck), Disp: , Rfl:    methocarbamol (ROBAXIN) 500 MG tablet, Take 1,000 mg by mouth every 12 (twelve) hours. At 1400, Disp: , Rfl:    metoprolol tartrate (LOPRESSOR) 25 MG tablet, Take 0.5 tablets (12.5 mg total) by mouth 2 (two) times daily. Reduced from 50 mg., Disp: , Rfl:    midodrine (PROAMATINE) 2.5 MG tablet, Take 1 tablet (2.5 mg total) by mouth 3 (three) times daily with meals., Disp: , Rfl:    mirabegron ER (MYRBETRIQ) 25 MG TB24 tablet, Take 1 tablet (25 mg total) by mouth daily., Disp: , Rfl:    oxybutynin (DITROPAN) 5 MG tablet, Take 5 mg by mouth 2 (two) times daily., Disp: , Rfl:    pantoprazole (PROTONIX) 40 MG tablet, Take 1 tablet (40 mg total) by mouth daily., Disp: , Rfl:    polyethylene glycol (MIRALAX / GLYCOLAX) 17 g packet, Take 17 g by mouth daily., Disp: , Rfl:    pramoxine (SARNA SENSITIVE) 1 % LOTN, Apply 1 Application topically 4 (four) times daily as needed., Disp: , Rfl:    Probiotic Product (ACIDOPHILUS PROBIOTIC BLEND) CAPS, Take 1 capsule by mouth daily., Disp: , Rfl:    Propylene Glycol 0.6 % SOLN, Place 2 drops into both eyes in the morning, at noon, and at bedtime., Disp: , Rfl:    psyllium (METAMUCIL SMOOTH TEXTURE) 58.6 % powder, Take 1 packet by  mouth 3 (three) times daily., Disp: 283 g, Rfl: 1   senna (SENOKOT) 8.6 MG TABS tablet, Take 1 tablet (8.6 mg total) by mouth daily as needed for mild constipation., Disp: 120 tablet, Rfl: 0   sodium chloride (OCEAN) 0.65 % SOLN nasal spray, Place 2 sprays into both nostrils every 6 (six) hours as needed for congestion., Disp: , Rfl:    sodium phosphate (FLEET) ENEM, Place 133 mLs (1 enema total) rectally daily as needed for severe constipation., Disp: , Rfl:    tiZANidine (ZANAFLEX) 2 MG tablet, Take 2 mg by mouth 2 (two) times daily as needed for muscle spasms., Disp: , Rfl:    triamcinolone cream (KENALOG) 0.1 %, Apply 1 Application topically daily as needed. Apply to arms, legs, and back r/t rash, Disp: , Rfl:   Social History: Social History   Tobacco Use   Smoking status: Former    Current packs/day: 0.00    Average packs/day: 0.5 packs/day for 10.0 years (5.0 ttl pk-yrs)    Types: Cigarettes    Start date: 79    Quit date: 2006    Years since quitting: 19.2   Smokeless tobacco: Never  Vaping Use   Vaping status: Never Used  Substance Use Topics   Alcohol use: Not Currently   Drug use: Not Currently    Types: "Crack" cocaine    Comment: quit "crack" 15 years ago--11/04/2019    Family Medical History: Family History  Problem Relation Age of Onset   Healthy Son    Healthy Son     Physical Examination: There were no vitals filed for this visit.  General: Patient is in no apparent distress. Attention to examination is appropriate.  Neck:   Supple.  Full range of motion.  Respiratory: Patient is breathing without any difficulty.   NEUROLOGICAL:     Awake, alert, oriented to person, place, and time.  Speech is clear and fluent.   Cranial Nerves: Pupils equal round and reactive to light.  Facial tone is symmetric.  Facial sensation is  symmetric. Shoulder shrug is symmetric. Tongue protrusion is midline.  There is no pronator drift.  Strength: Side Biceps Triceps  Deltoid Interossei Grip Wrist Ext. Wrist Flex.  R 5 5 4 5 5 5 5   L 5 5 4- 5 4 1 1    Side Iliopsoas Quads Hamstring PF DF EHL  R 2 3 3 3 2 2   L 2 3 4 3 2 2    Reflexes are 2+ and symmetric at the biceps, triceps, brachioradialis, patella and achilles.   Hoffman's is absent.   Bilateral upper and lower extremity sensation is intact to light touch.    No evidence of dysmetria noted.  Gait is untested-he cannot walk.     Medical Decision Making  Imaging: MRI cervical spine on April 26, 2023 Chronic degenerative changes at the craniovertebral junction with superior subluxation of the dens relative to the anterior C1 ring with articulation at the skull base with severe stenosis at the foramen magnum without signal abnormality in the brainstem or cervical spinal cord to specifically indicate edema or myelomalacia.  The appearance is worsened compared to the more distant prior study from 2015 and is similar in appearance compared to the head CT from 2021.  I have personally reviewed the images and agree with the above interpretation.  Assessment and Plan: Mr. Meddaugh is a pleasant 78 y.o. male with chronic myelopathy and inability to walk.  He has not walked in years.  He has multiple other medical issues which make him an extremely high risk surgical candidate. 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  His most pressing issue is neck pain.  I recommended referral to pain management.  I will go also give him some exercises to do for his neck.  I will see him back on an as-needed basis.  I spent a total of 30 minutes in this patient's care today. This time was spent reviewing pertinent records including imaging studies, obtaining and confirming history, performing a directed evaluation, formulating and discussing my recommendations, and documenting the visit within the medical record.    Thank  you for involving me in the care of this patient.      Sosha Shepherd K. Myer Haff MD, [facility] Neurosurgery

## 2023-07-03 ENCOUNTER — Other Ambulatory Visit: Payer: Self-pay

## 2023-07-03 ENCOUNTER — Emergency Department

## 2023-07-03 ENCOUNTER — Inpatient Hospital Stay
Admission: EM | Admit: 2023-07-03 | Discharge: 2023-07-05 | DRG: 871 | Discharge status: Other Acute Inpatient Hospital | Source: Skilled Nursing Facility | Attending: Internal Medicine | Admitting: Internal Medicine

## 2023-07-03 DIAGNOSIS — J440 Chronic obstructive pulmonary disease with acute lower respiratory infection: Secondary | ICD-10-CM | POA: Diagnosis present

## 2023-07-03 DIAGNOSIS — E785 Hyperlipidemia, unspecified: Secondary | ICD-10-CM | POA: Diagnosis present

## 2023-07-03 DIAGNOSIS — J189 Pneumonia, unspecified organism: Secondary | ICD-10-CM

## 2023-07-03 DIAGNOSIS — Z66 Do not resuscitate: Secondary | ICD-10-CM | POA: Diagnosis present

## 2023-07-03 DIAGNOSIS — Z515 Encounter for palliative care: Secondary | ICD-10-CM | POA: Diagnosis not present

## 2023-07-03 DIAGNOSIS — J9621 Acute and chronic respiratory failure with hypoxia: Secondary | ICD-10-CM | POA: Diagnosis present

## 2023-07-03 DIAGNOSIS — E1142 Type 2 diabetes mellitus with diabetic polyneuropathy: Secondary | ICD-10-CM | POA: Diagnosis present

## 2023-07-03 DIAGNOSIS — I5032 Chronic diastolic (congestive) heart failure: Secondary | ICD-10-CM | POA: Diagnosis present

## 2023-07-03 DIAGNOSIS — Z79899 Other long term (current) drug therapy: Secondary | ICD-10-CM | POA: Diagnosis not present

## 2023-07-03 DIAGNOSIS — E039 Hypothyroidism, unspecified: Secondary | ICD-10-CM | POA: Diagnosis present

## 2023-07-03 DIAGNOSIS — A419 Sepsis, unspecified organism: Secondary | ICD-10-CM | POA: Diagnosis present

## 2023-07-03 DIAGNOSIS — I480 Paroxysmal atrial fibrillation: Secondary | ICD-10-CM | POA: Diagnosis present

## 2023-07-03 DIAGNOSIS — Z87891 Personal history of nicotine dependence: Secondary | ICD-10-CM

## 2023-07-03 DIAGNOSIS — J121 Respiratory syncytial virus pneumonia: Secondary | ICD-10-CM | POA: Diagnosis present

## 2023-07-03 DIAGNOSIS — Z7401 Bed confinement status: Secondary | ICD-10-CM | POA: Diagnosis not present

## 2023-07-03 DIAGNOSIS — Z993 Dependence on wheelchair: Secondary | ICD-10-CM

## 2023-07-03 DIAGNOSIS — I110 Hypertensive heart disease with heart failure: Secondary | ICD-10-CM | POA: Diagnosis present

## 2023-07-03 DIAGNOSIS — I9589 Other hypotension: Secondary | ICD-10-CM | POA: Diagnosis present

## 2023-07-03 DIAGNOSIS — Z794 Long term (current) use of insulin: Secondary | ICD-10-CM | POA: Diagnosis not present

## 2023-07-03 DIAGNOSIS — Z1152 Encounter for screening for COVID-19: Secondary | ICD-10-CM | POA: Diagnosis not present

## 2023-07-03 DIAGNOSIS — Z96643 Presence of artificial hip joint, bilateral: Secondary | ICD-10-CM | POA: Diagnosis present

## 2023-07-03 DIAGNOSIS — Z7901 Long term (current) use of anticoagulants: Secondary | ICD-10-CM

## 2023-07-03 DIAGNOSIS — Z6832 Body mass index (BMI) 32.0-32.9, adult: Secondary | ICD-10-CM

## 2023-07-03 DIAGNOSIS — R6520 Severe sepsis without septic shock: Secondary | ICD-10-CM | POA: Diagnosis present

## 2023-07-03 DIAGNOSIS — G20A1 Parkinson's disease without dyskinesia, without mention of fluctuations: Secondary | ICD-10-CM | POA: Diagnosis present

## 2023-07-03 DIAGNOSIS — R54 Age-related physical debility: Secondary | ICD-10-CM | POA: Diagnosis present

## 2023-07-03 DIAGNOSIS — B338 Other specified viral diseases: Secondary | ICD-10-CM

## 2023-07-03 DIAGNOSIS — N319 Neuromuscular dysfunction of bladder, unspecified: Secondary | ICD-10-CM | POA: Diagnosis present

## 2023-07-03 DIAGNOSIS — E669 Obesity, unspecified: Secondary | ICD-10-CM | POA: Diagnosis present

## 2023-07-03 DIAGNOSIS — F418 Other specified anxiety disorders: Secondary | ICD-10-CM | POA: Diagnosis present

## 2023-07-03 DIAGNOSIS — Z79890 Hormone replacement therapy (postmenopausal): Secondary | ICD-10-CM

## 2023-07-03 DIAGNOSIS — Z7951 Long term (current) use of inhaled steroids: Secondary | ICD-10-CM

## 2023-07-03 DIAGNOSIS — A4189 Other specified sepsis: Principal | ICD-10-CM | POA: Diagnosis present

## 2023-07-03 LAB — CBC WITH DIFFERENTIAL/PLATELET
Abs Immature Granulocytes: 0.1 10*3/uL — ABNORMAL HIGH (ref 0.00–0.07)
Basophils Absolute: 0.1 10*3/uL (ref 0.0–0.1)
Basophils Relative: 0 %
Eosinophils Absolute: 0 10*3/uL (ref 0.0–0.5)
Eosinophils Relative: 0 %
HCT: 34.5 % — ABNORMAL LOW (ref 39.0–52.0)
Hemoglobin: 11.1 g/dL — ABNORMAL LOW (ref 13.0–17.0)
Immature Granulocytes: 1 %
Lymphocytes Relative: 13 %
Lymphs Abs: 1.9 10*3/uL (ref 0.7–4.0)
MCH: 31 pg (ref 26.0–34.0)
MCHC: 32.2 g/dL (ref 30.0–36.0)
MCV: 96.4 fL (ref 80.0–100.0)
Monocytes Absolute: 0.9 10*3/uL (ref 0.1–1.0)
Monocytes Relative: 7 %
Neutro Abs: 11.2 10*3/uL — ABNORMAL HIGH (ref 1.7–7.7)
Neutrophils Relative %: 79 %
Platelets: 241 10*3/uL (ref 150–400)
RBC: 3.58 MIL/uL — ABNORMAL LOW (ref 4.22–5.81)
RDW: 17.6 % — ABNORMAL HIGH (ref 11.5–15.5)
WBC: 14.3 10*3/uL — ABNORMAL HIGH (ref 4.0–10.5)
nRBC: 0 % (ref 0.0–0.2)

## 2023-07-03 LAB — URINALYSIS, W/ REFLEX TO CULTURE (INFECTION SUSPECTED)
Bilirubin Urine: NEGATIVE
Glucose, UA: NEGATIVE mg/dL
Ketones, ur: NEGATIVE mg/dL
Nitrite: NEGATIVE
Protein, ur: NEGATIVE mg/dL
Specific Gravity, Urine: 1.012 (ref 1.005–1.030)
pH: 7 (ref 5.0–8.0)

## 2023-07-03 LAB — COMPREHENSIVE METABOLIC PANEL WITH GFR
ALT: <5 U/L (ref 0–44)
AST: 18 U/L (ref 15–41)
Albumin: 2.9 g/dL — ABNORMAL LOW (ref 3.5–5.0)
Alkaline Phosphatase: 89 U/L (ref 38–126)
Anion gap: 11 (ref 5–15)
BUN: 22 mg/dL (ref 8–23)
CO2: 26 mmol/L (ref 22–32)
Calcium: 8.7 mg/dL — ABNORMAL LOW (ref 8.9–10.3)
Chloride: 100 mmol/L (ref 98–111)
Creatinine, Ser: 0.93 mg/dL (ref 0.61–1.24)
GFR, Estimated: >60 mL/min (ref >60)
Glucose, Bld: 164 mg/dL — ABNORMAL HIGH (ref 70–99)
Potassium: 4.2 mmol/L (ref 3.5–5.1)
Sodium: 137 mmol/L (ref 135–145)
Total Bilirubin: 1 mg/dL (ref 0.0–1.2)
Total Protein: 6.9 g/dL (ref 6.5–8.1)

## 2023-07-03 LAB — BLOOD GAS, VENOUS
Acid-Base Excess: 5.3 mmol/L — ABNORMAL HIGH (ref 0.0–2.0)
Bicarbonate: 31.9 mmol/L — ABNORMAL HIGH (ref 20.0–28.0)
O2 Saturation: 31.8 %
Patient temperature: 37
pCO2, Ven: 54 mmHg (ref 44–60)
pH, Ven: 7.38 (ref 7.25–7.43)

## 2023-07-03 LAB — RESP PANEL BY RT-PCR (RSV, FLU A&B, COVID)  RVPGX2
Influenza A by PCR: NEGATIVE
Influenza B by PCR: NEGATIVE
Resp Syncytial Virus by PCR: POSITIVE — AB
SARS Coronavirus 2 by RT PCR: NEGATIVE

## 2023-07-03 LAB — PROTIME-INR
INR: 2 — ABNORMAL HIGH (ref 0.8–1.2)
Prothrombin Time: 22.5 s — ABNORMAL HIGH (ref 11.4–15.2)

## 2023-07-03 LAB — TROPONIN I (HIGH SENSITIVITY)
Troponin I (High Sensitivity): 16 ng/L (ref <18)
Troponin I (High Sensitivity): 17 ng/L (ref <18)

## 2023-07-03 LAB — BRAIN NATRIURETIC PEPTIDE: B Natriuretic Peptide: 394.2 pg/mL — ABNORMAL HIGH (ref 0.0–100.0)

## 2023-07-03 LAB — LACTIC ACID, PLASMA: Lactic Acid, Venous: 1.9 mmol/L (ref 0.5–1.9)

## 2023-07-03 LAB — CBG MONITORING, ED: Glucose-Capillary: 223 mg/dL — ABNORMAL HIGH (ref 70–99)

## 2023-07-03 MED ORDER — ONDANSETRON HCL 4 MG/2ML IJ SOLN: 4.0000 mg | Freq: Four times a day (QID) | INTRAMUSCULAR | Status: DC | PRN

## 2023-07-03 MED ORDER — APIXABAN 5 MG PO TABS
5.0000 mg | ORAL_TABLET | Freq: Two times a day (BID) | ORAL | Status: DC
  Administered 2023-07-03 – 2023-07-05 (×4): 5 mg via ORAL
  Filled 2023-07-03 (×4): qty 1

## 2023-07-03 MED ORDER — UMECLIDINIUM BROMIDE 62.5 MCG/ACT IN AEPB
1.0000 | INHALATION_SPRAY | Freq: Every day | RESPIRATORY_TRACT | Status: DC
  Administered 2023-07-05: 1 via RESPIRATORY_TRACT
  Filled 2023-07-03 (×2): qty 7

## 2023-07-03 MED ORDER — ATORVASTATIN CALCIUM 20 MG PO TABS
20.0000 mg | ORAL_TABLET | Freq: Every day | ORAL | Status: DC
  Administered 2023-07-03 – 2023-07-04 (×2): 20 mg via ORAL
  Filled 2023-07-03 (×2): qty 1

## 2023-07-03 MED ORDER — DM-GUAIFENESIN ER 30-600 MG PO TB12
1.0000 | ORAL_TABLET | Freq: Two times a day (BID) | ORAL | Status: DC | PRN
  Administered 2023-07-04: 1 via ORAL
  Filled 2023-07-03: qty 1

## 2023-07-03 MED ORDER — METHOCARBAMOL 500 MG PO TABS
1000.0000 mg | ORAL_TABLET | Freq: Two times a day (BID) | ORAL | Status: DC
  Administered 2023-07-03 – 2023-07-05 (×4): 1000 mg via ORAL
  Filled 2023-07-03 (×4): qty 2

## 2023-07-03 MED ORDER — PANTOPRAZOLE SODIUM 40 MG PO TBEC
40.0000 mg | DELAYED_RELEASE_TABLET | Freq: Every day | ORAL | Status: DC
  Administered 2023-07-04 – 2023-07-05 (×2): 40 mg via ORAL
  Filled 2023-07-03 (×2): qty 1

## 2023-07-03 MED ORDER — TIZANIDINE HCL 2 MG PO TABS: 2.0000 mg | ORAL_TABLET | Freq: Two times a day (BID) | ORAL | Status: DC | PRN

## 2023-07-03 MED ORDER — DILTIAZEM HCL ER COATED BEADS 120 MG PO CP24
120.0000 mg | ORAL_CAPSULE | Freq: Every day | ORAL | Status: DC
  Administered 2023-07-04 – 2023-07-05 (×2): 120 mg via ORAL
  Filled 2023-07-03 (×2): qty 1

## 2023-07-03 MED ORDER — MIDODRINE HCL 5 MG PO TABS
2.5000 mg | ORAL_TABLET | Freq: Three times a day (TID) | ORAL | Status: DC
  Administered 2023-07-04 – 2023-07-05 (×4): 2.5 mg via ORAL
  Filled 2023-07-03 (×4): qty 1

## 2023-07-03 MED ORDER — LEVOTHYROXINE SODIUM 50 MCG PO TABS
75.0000 ug | ORAL_TABLET | Freq: Every day | ORAL | Status: DC
  Administered 2023-07-04 – 2023-07-05 (×2): 75 ug via ORAL
  Filled 2023-07-03: qty 1
  Filled 2023-07-03: qty 2

## 2023-07-03 MED ORDER — INSULIN ASPART 100 UNIT/ML IJ SOLN
0.0000 [IU] | Freq: Every day | INTRAMUSCULAR | Status: DC
  Administered 2023-07-03: 2 [IU] via SUBCUTANEOUS
  Filled 2023-07-03: qty 1

## 2023-07-03 MED ORDER — SALINE SPRAY 0.65 % NA SOLN: 2.0000 | Freq: Four times a day (QID) | NASAL | Status: DC | PRN

## 2023-07-03 MED ORDER — OXYBUTYNIN CHLORIDE 5 MG PO TABS
5.0000 mg | ORAL_TABLET | Freq: Two times a day (BID) | ORAL | Status: DC
  Administered 2023-07-03 – 2023-07-05 (×4): 5 mg via ORAL
  Filled 2023-07-03 (×4): qty 1

## 2023-07-03 MED ORDER — ALBUTEROL SULFATE (2.5 MG/3ML) 0.083% IN NEBU: 2.5000 mg | INHALATION_SOLUTION | RESPIRATORY_TRACT | Status: DC | PRN

## 2023-07-03 MED ORDER — SODIUM CHLORIDE 0.9 % IV SOLN
2.0000 g | Freq: Once | INTRAVENOUS | Status: AC
  Administered 2023-07-03: 2 g via INTRAVENOUS
  Filled 2023-07-03: qty 12.5

## 2023-07-03 MED ORDER — FERROUS SULFATE 325 (65 FE) MG PO TABS
325.0000 mg | ORAL_TABLET | Freq: Every day | ORAL | Status: DC
  Administered 2023-07-04 – 2023-07-05 (×2): 325 mg via ORAL
  Filled 2023-07-03 (×2): qty 1

## 2023-07-03 MED ORDER — CARBIDOPA-LEVODOPA 25-100 MG PO TABS
1.0000 | ORAL_TABLET | Freq: Three times a day (TID) | ORAL | Status: DC
  Administered 2023-07-04 – 2023-07-05 (×5): 1 via ORAL
  Filled 2023-07-03 (×7): qty 1

## 2023-07-03 MED ORDER — IPRATROPIUM-ALBUTEROL 0.5-2.5 (3) MG/3ML IN SOLN
6.0000 mL | Freq: Once | RESPIRATORY_TRACT | Status: AC
  Administered 2023-07-03: 6 mL via RESPIRATORY_TRACT
  Filled 2023-07-03: qty 6

## 2023-07-03 MED ORDER — IPRATROPIUM-ALBUTEROL 0.5-2.5 (3) MG/3ML IN SOLN
3.0000 mL | Freq: Four times a day (QID) | RESPIRATORY_TRACT | Status: DC
  Administered 2023-07-03 – 2023-07-04 (×3): 3 mL via RESPIRATORY_TRACT
  Filled 2023-07-03 (×3): qty 3

## 2023-07-03 MED ORDER — INSULIN ASPART 100 UNIT/ML IJ SOLN
0.0000 [IU] | Freq: Three times a day (TID) | INTRAMUSCULAR | Status: DC
  Administered 2023-07-04: 3 [IU] via SUBCUTANEOUS
  Administered 2023-07-04: 5 [IU] via SUBCUTANEOUS
  Administered 2023-07-04 – 2023-07-05 (×2): 2 [IU] via SUBCUTANEOUS
  Administered 2023-07-05: 5 [IU] via SUBCUTANEOUS
  Filled 2023-07-03 (×5): qty 1

## 2023-07-03 MED ORDER — ACETAMINOPHEN 325 MG RE SUPP: 650.0000 mg | Freq: Four times a day (QID) | RECTAL | Status: DC | PRN

## 2023-07-03 MED ORDER — METHYLPREDNISOLONE SODIUM SUCC 40 MG IJ SOLR
40.0000 mg | Freq: Every day | INTRAMUSCULAR | Status: DC
  Administered 2023-07-03 – 2023-07-05 (×3): 40 mg via INTRAVENOUS
  Filled 2023-07-03 (×3): qty 1

## 2023-07-03 MED ORDER — SODIUM CHLORIDE 0.9 % IV SOLN
100.0000 mg | Freq: Once | INTRAVENOUS | Status: AC
  Administered 2023-07-03: 100 mg via INTRAVENOUS
  Filled 2023-07-03: qty 100

## 2023-07-03 MED ORDER — FUROSEMIDE 10 MG/ML IJ SOLN
20.0000 mg | Freq: Once | INTRAMUSCULAR | Status: AC
  Administered 2023-07-03: 20 mg via INTRAVENOUS
  Filled 2023-07-03: qty 4

## 2023-07-03 MED ORDER — FLUTICASONE FUROATE-VILANTEROL 100-25 MCG/ACT IN AEPB
1.0000 | INHALATION_SPRAY | Freq: Every day | RESPIRATORY_TRACT | Status: DC
  Administered 2023-07-05: 1 via RESPIRATORY_TRACT
  Filled 2023-07-03 (×2): qty 28

## 2023-07-03 MED ORDER — ACETAMINOPHEN 325 MG PO TABS: 650.0000 mg | ORAL_TABLET | Freq: Four times a day (QID) | ORAL | Status: DC | PRN

## 2023-07-03 MED ORDER — METOPROLOL TARTRATE 25 MG PO TABS
12.5000 mg | ORAL_TABLET | Freq: Two times a day (BID) | ORAL | Status: DC
  Administered 2023-07-04 – 2023-07-05 (×3): 12.5 mg via ORAL
  Filled 2023-07-03 (×4): qty 1

## 2023-07-03 MED ORDER — HYDROXYZINE HCL 50 MG PO TABS: 25.0000 mg | ORAL_TABLET | Freq: Four times a day (QID) | ORAL | Status: DC | PRN

## 2023-07-03 MED ORDER — SODIUM CHLORIDE 0.9 % IV SOLN
2.0000 g | INTRAVENOUS | Status: DC
  Administered 2023-07-03 – 2023-07-04 (×2): 2 g via INTRAVENOUS
  Filled 2023-07-03 (×2): qty 20

## 2023-07-03 MED ORDER — VANCOMYCIN HCL IN DEXTROSE 1-5 GM/200ML-% IV SOLN
1000.0000 mg | Freq: Once | INTRAVENOUS | Status: AC
  Administered 2023-07-03: 1000 mg via INTRAVENOUS
  Filled 2023-07-03: qty 200

## 2023-07-03 MED ORDER — IPRATROPIUM BROMIDE 0.06 % NA SOLN
2.0000 | Freq: Three times a day (TID) | NASAL | Status: DC
  Administered 2023-07-04 – 2023-07-05 (×2): 2 via NASAL
  Filled 2023-07-03 (×2): qty 15

## 2023-07-03 MED ORDER — ALLOPURINOL 100 MG PO TABS
200.0000 mg | ORAL_TABLET | Freq: Every day | ORAL | Status: DC
  Administered 2023-07-04 – 2023-07-05 (×2): 200 mg via ORAL
  Filled 2023-07-03 (×2): qty 2

## 2023-07-03 MED ORDER — ONDANSETRON HCL 4 MG PO TABS: 4.0000 mg | ORAL_TABLET | Freq: Four times a day (QID) | ORAL | Status: DC | PRN

## 2023-07-03 MED ORDER — LACTATED RINGERS IV SOLN: 150.0000 mL/h | INTRAVENOUS | Status: DC

## 2023-07-03 MED ORDER — ACETAMINOPHEN 325 MG PO TABS
650.0000 mg | ORAL_TABLET | Freq: Once | ORAL | Status: AC
  Administered 2023-07-03: 650 mg via ORAL
  Filled 2023-07-03: qty 2

## 2023-07-03 MED ORDER — FLUTICASONE PROPIONATE 50 MCG/ACT NA SUSP
2.0000 | Freq: Every day | NASAL | Status: DC
  Filled 2023-07-03: qty 16

## 2023-07-03 MED ORDER — SODIUM CHLORIDE 0.9 % IV SOLN
500.0000 mg | INTRAVENOUS | Status: DC
  Administered 2023-07-03 – 2023-07-04 (×2): 500 mg via INTRAVENOUS
  Filled 2023-07-03 (×3): qty 5

## 2023-07-03 MED ORDER — MIRABEGRON ER 25 MG PO TB24
25.0000 mg | ORAL_TABLET | Freq: Every day | ORAL | Status: DC
  Administered 2023-07-04 – 2023-07-05 (×2): 25 mg via ORAL
  Filled 2023-07-03 (×2): qty 1

## 2023-07-03 MED ORDER — INSULIN GLARGINE-YFGN 100 UNIT/ML ~~LOC~~ SOLN
5.0000 [IU] | Freq: Every day | SUBCUTANEOUS | Status: DC
  Administered 2023-07-03 – 2023-07-04 (×2): 5 [IU] via SUBCUTANEOUS
  Filled 2023-07-03 (×3): qty 0.05

## 2023-07-03 MED ORDER — GABAPENTIN 400 MG PO CAPS
800.0000 mg | ORAL_CAPSULE | Freq: Four times a day (QID) | ORAL | Status: DC
  Administered 2023-07-03 – 2023-07-05 (×6): 800 mg via ORAL
  Filled 2023-07-03 (×6): qty 2
  Filled 2023-07-03 (×3): qty 8
  Filled 2023-07-03 (×2): qty 2

## 2023-07-03 MED ORDER — ENSURE ENLIVE PO LIQD
237.0000 mL | Freq: Two times a day (BID) | ORAL | Status: DC
  Administered 2023-07-04 (×2): 237 mL via ORAL

## 2023-07-03 NOTE — H&P (Addendum)
 History and Physical    Raymond Hays WGN:[id number] DOB: May 24, 1945 DOA: 07/03/2023  PCP: Pearson Grippe, MD (Confirm with patient/family/NH records and if not entered, this has to be entered at [facility] point of entry) Patient coming from: Long-term care  I have personally briefly reviewed patient's old medical records in [facility]  Chief Complaint: Fever, shortness of breath  HPI: Raymond Hays is a 78 y.o. male with medical history significant of COPD, chronic hypoxic respiratory failure on 2 L baseline, hypertension, diastolic congestive heart failure, chronic suprapubic Foley catheter, neurogenic bladder, atrial fibrillation on Eliquis, history of ESBL UTI, spinal stenosis, wheelchair-bound, Parkinson disease, hypertension who presents with shortness of breath and fever from his care home.  Per report patient was found to be short of breath.  Care home staff attempted DuoNeb.  Shortness of breath persisted and patient was transported to ER via EMS.  Initially brought in on BiPAP.  Arterial blood gas done in the emergency room look reassuring.  Patient was transitioned back to nasal cannula and currently saturating well on 3 L.  Laboratory vesication significant for RSV positivity.  Electrolytes overall stable.  White blood cell count elevated.  On my evaluation patient is a chronically ill-appearing male.  Lying in bed.  Answers all questions appropriately.  Mentation intact.  No conversational dyspnea noted.  Patient received DuoNebs, steroids, antibiotics in ED prior to my evaluation.  ED Course: Patient received bronchodilators, steroids, IV antibiotics in the ED.  Hospitalist contacted for admission.  Review of Systems: As per HPI otherwise 14 point review of systems negative.    Past Medical History:  Diagnosis Date   Allergic rhinitis    Anxiety    Arthritis    Atrial fibrillation (HCC)    Chronic indwelling Foley catheter    COPD (chronic obstructive pulmonary disease) (HCC)     Cough    Diabetes (HCC)    Essential (primary) hypertension    Hemorrhoid    Hyperlipidemia    PAF (paroxysmal atrial fibrillation) (HCC)    Primary osteoarthritis, unspecified shoulder    Retention of urine, unspecified    Spinal stenosis    UTI (urinary tract infection)     Past Surgical History:  Procedure Laterality Date   APPENDECTOMY     BACK SURGERY     COLONOSCOPY N/A 10/27/2020   Procedure: COLONOSCOPY;  Surgeon: Wyline Mood, MD;  Location: [facility] ENDOSCOPY;  Service: Gastroenterology;  Laterality: N/A;   ELBOW SURGERY     ESOPHAGOGASTRODUODENOSCOPY (EGD) WITH PROPOFOL N/A 10/27/2020   Procedure: ESOPHAGOGASTRODUODENOSCOPY (EGD) WITH PROPOFOL;  Surgeon: Wyline Mood, MD;  Location: [facility] ENDOSCOPY;  Service: Gastroenterology;  Laterality: N/A;   HERNIA REPAIR     KNEE SURGERY     neck     PARTIAL HIP ARTHROPLASTY Bilateral    TEE WITHOUT CARDIOVERSION N/A 10/19/2020   Procedure: TRANSESOPHAGEAL ECHOCARDIOGRAM (TEE);  Surgeon: Antonieta Iba, MD;  Location: [facility] ORS;  Service: Cardiovascular;  Laterality: N/A;   TONSILLECTOMY       reports that he quit smoking about 19 years ago. His smoking use included cigarettes. He started smoking about 29 years ago. He has a 5 pack-year smoking history. He has never used smokeless tobacco. He reports that he does not currently use alcohol. He reports that he does not currently use drugs after having used the following drugs: "Crack" cocaine.  No Known Allergies  Family History  Problem Relation Age of Onset   Healthy Son    Healthy Son  No known family history  Prior to Admission medications   Medication Sig Start Date End Date Taking? Authorizing Provider  acetaminophen (TYLENOL) 325 MG tablet Take 2 tablets (650 mg total) by mouth every 6 (six) hours as needed for mild pain (pain score 1-3), fever or moderate pain (pain score 4-6). 05/09/23   Sunnie Nielsen, DO  Albuterol Sulfate (PROAIR RESPICLICK) 108 (90 Base) MCG/ACT  AEPB Inhale 2 puffs into the lungs every 4 (four) hours as needed (COPD).    [provider]  allopurinol (ZYLOPRIM) 100 MG tablet Take 200 mg by mouth daily.    [provider]  apixaban (ELIQUIS) 5 MG TABS tablet Take 5 mg by mouth 2 (two) times daily.     [provider]  ascorbic acid (VITAMIN C) 500 MG tablet Take 500 mg by mouth daily.    [provider]  atorvastatin (LIPITOR) 20 MG tablet Take 20 mg by mouth at bedtime.    [provider]  bisacodyl (DULCOLAX) 10 MG suppository Place 10 mg rectally every 3 (three) days. (HOLD if having loose stools)    [provider]  calcium carbonate (TUMS - DOSED IN MG ELEMENTAL CALCIUM) 500 MG chewable tablet Chew 2 tablets by mouth daily as needed for indigestion or heartburn. 01/15/23   [provider]  carbidopa-levodopa (SINEMET IR) 25-100 MG tablet Take 1 tablet by mouth 3 (three) times daily before meals.    [provider]  Cholecalciferol (VITAMIN D3) 50 MCG (2000 UT) TABS Take 1 tablet by mouth daily. 04/22/23   [provider]  dextromethorphan-guaiFENesin (MUCINEX DM) 30-600 MG 12hr tablet Take 1 tablet by mouth 2 (two) times daily as needed for cough. 05/09/23   Sunnie Nielsen, DO  diltiazem (CARDIZEM CD) 240 MG 24 hr capsule Take 1 capsule (240 mg total) by mouth daily. 03/17/23   Shalhoub, Deno Lunger, MD  docusate sodium (COLACE) 100 MG capsule Take 100 mg by mouth daily.    [provider]  Emollient (CETAPHIL) cream Apply 1 Application topically 2 (two) times daily as needed.    [provider]  Eyelid Cleansers (OCUSOFT LID SCRUB ORIGINAL) PADS Place 1 Application into both eyes daily. Wipe both eyelids once per day.    [provider]  Eyelid Cleansers (OCUSOFT LID SCRUB ORIGINAL) PADS Apply 1 Pad topically every 12 (twelve) hours as needed.    [provider]  feeding supplement (ENSURE ENLIVE / ENSURE PLUS) LIQD Take 237  mLs by mouth 2 (two) times daily between meals. 05/10/23   Sunnie Nielsen, DO  ferrous sulfate 324 (65 Fe) MG TBEC Take 1 tablet by mouth daily. 12/05/22   [provider]  fluticasone (FLONASE) 50 MCG/ACT nasal spray Place 2 sprays into both nostrils daily.    [provider]  Fluticasone-Umeclidin-Vilant 100-62.5-25 MCG/ACT AEPB Inhale 1 puff into the lungs daily.    [provider]  furosemide (LASIX) 20 MG tablet Take 2 tablets (40 mg total) by mouth daily. 05/09/23   Sunnie Nielsen, DO  gabapentin (NEURONTIN) 800 MG tablet Take 800 mg by mouth 4 (four) times daily.    [provider]  hydrocortisone (ANUSOL-HC) 25 MG suppository Place 25 mg rectally every 4 (four) hours as needed for hemorrhoids or anal itching.    [provider]  hydrOXYzine (ATARAX/VISTARIL) 25 MG tablet Take 25 mg by mouth every 6 (six) hours as needed for itching. 02/17/20   [provider]  insulin glargine-yfgn (SEMGLEE) 100 UNIT/ML injection Inject  0.1 mLs (10 Units total) into the skin daily. 05/10/23   Sunnie Nielsen, DO  ipratropium (ATROVENT) 0.06 % nasal spray Place 2 sprays into both nostrils 3 (three) times daily. 11/04/22   [provider]  ipratropium-albuterol (DUONEB) 0.5-2.5 (3) MG/3ML SOLN Take 3 mLs by nebulization every 6 (six) hours.    [provider]  lactase (LACTAID) 3000 units tablet Take 3,000 Units by mouth 3 (three) times daily with meals.    [provider]  lactulose (CHRONULAC) 10 GM/15ML solution Take 45 mLs (30 g total) by mouth daily. 05/10/23   Sunnie Nielsen, DO  levothyroxine (SYNTHROID) 75 MCG tablet Take 75 mcg by mouth daily before breakfast.    [provider]  lidocaine 4 % Place 1 patch onto the skin daily as needed (pain). (Remove after 12 hours)    [provider]  liver oil-zinc oxide (DESITIN) 40 % ointment Apply topically 2 (two) times daily. On skin around suprapubic cathether  06/13/23   [name], MD  Menthol, Topical Analgesic, (BIOFREEZE) 4 % GEL Apply 1 application  topically 3 (three) times daily. (Apply to neck)    [provider]  methocarbamol (ROBAXIN) 500 MG tablet Take 1,000 mg by mouth every 12 (twelve) hours. At 1400    [provider]  metoprolol tartrate (LOPRESSOR) 25 MG tablet Take 0.5 tablets (12.5 mg total) by mouth 2 (two) times daily. Reduced from 50 mg. 06/13/23   [name], MD  midodrine (PROAMATINE) 2.5 MG tablet Take 1 tablet (2.5 mg total) by mouth 3 (three) times daily with meals. 06/13/23   [name], MD  mirabegron ER (MYRBETRIQ) 25 MG TB24 tablet Take 1 tablet (25 mg total) by mouth daily. 06/13/23   [name], MD  oxybutynin (DITROPAN) 5 MG tablet Take 5 mg by mouth 2 (two) times daily.    [provider]  pantoprazole (PROTONIX) 40 MG tablet Take 1 tablet (40 mg total) by mouth daily. 05/10/23   Sunnie Nielsen, DO  polyethylene glycol (MIRALAX / GLYCOLAX) 17 g packet Take 17 g by mouth daily.    [provider]  pramoxine (SARNA SENSITIVE) 1 % LOTN Apply 1 Application topically 4 (four) times daily as needed.    [provider]  Probiotic Product (ACIDOPHILUS PROBIOTIC BLEND) CAPS Take 1 capsule by mouth daily. 12/05/22   [provider]  Propylene Glycol 0.6 % SOLN Place 2 drops into both eyes in the morning, at noon, and at bedtime.    [provider]  psyllium (METAMUCIL SMOOTH TEXTURE) 58.6 % powder Take 1 packet by mouth 3 (three) times daily. 09/29/22   Chesley Noon, MD  senna (SENOKOT) 8.6 MG TABS tablet Take 1 tablet (8.6 mg total) by mouth daily as needed for mild constipation. 09/29/22   Chesley Noon, MD  sodium chloride (OCEAN) 0.65 % SOLN nasal spray Place 2 sprays into both nostrils every 6 (six) hours as needed for congestion.    [provider]  sodium phosphate (FLEET) ENEM Place 133 mLs (1 enema total) rectally daily as needed for severe constipation.  05/09/23   Sunnie Nielsen, DO  tiZANidine (ZANAFLEX) 2 MG tablet Take 2 mg by mouth 2 (two) times daily as needed for muscle spasms.    [provider]  triamcinolone cream (KENALOG) 0.1 % Apply 1 Application topically daily as needed. Apply to arms, legs, and back r/t rash    [provider]    Physical Exam: Vitals:   07/03/23 1602 07/03/23  1610 07/03/23 1625 07/03/23 1630  BP:  104/76  123/74  Pulse:    (!) 113  Resp:    (!) 28  Temp:   (!) 102.3 F (39.1 C) (!) 102.6 F (39.2 C)  TempSrc:   Rectal   SpO2:    100%  Weight: 97.8 kg     Height: 5\' 8"  (1.727 m)      General: Appears frail and chronically ill HEENT: Normocephalic, atraumatic, poor dentition Neck, supple, trachea midline, no tenderness Heart: Regular rate and rhythm, S1/S2 normal, no murmurs Lungs: Diminished breath sounds.  Scattered crackles.  Mild expiratory wheeze.  Normal work of breathing.  4 L Abdomen: Soft, nontender, nondistended, positive bowel sounds Extremities: Normal, atraumatic, no clubbing or cyanosis, decreased muscle tone bilateral lower extremities Skin: No rashes or lesions, normal color Neurologic: Cranial nerves grossly intact, sensation intact, alert and oriented x3 Psychiatric: Normal affect   Labs on Admission: I have personally reviewed following labs and imaging studies  CBC: Recent Labs  Lab 07/03/23 1559  WBC 14.3*  NEUTROABS 11.2*  HGB 11.1*  HCT 34.5*  MCV 96.4  PLT 241   Basic Metabolic Panel: Recent Labs  Lab 07/03/23 1559  NA 137  K 4.2  CL 100  CO2 26  GLUCOSE 164*  BUN 22  CREATININE 0.93  CALCIUM 8.7*   GFR: Estimated Creatinine Clearance: 73.1 mL/min (by C-G formula based on SCr of 0.93 mg/dL). Liver Function Tests: Recent Labs  Lab 07/03/23 1559  AST 18  ALT <5  ALKPHOS 89  BILITOT 1.0  PROT 6.9  ALBUMIN 2.9*   No results for input(s): "LIPASE", "AMYLASE" in the last 168 hours. No results for input(s): "AMMONIA" in the last  168 hours. Coagulation Profile: Recent Labs  Lab 07/03/23 1559  INR 2.0*   Cardiac Enzymes: No results for input(s): "CKTOTAL", "CKMB", "CKMBINDEX", "TROPONINI" in the last 168 hours. BNP (last 3 results) No results for input(s): "PROBNP" in the last 8760 hours. HbA1C: No results for input(s): "HGBA1C" in the last 72 hours. CBG: No results for input(s): "GLUCAP" in the last 168 hours. Lipid Profile: No results for input(s): "CHOL", "HDL", "LDLCALC", "TRIG", "CHOLHDL", "LDLDIRECT" in the last 72 hours. Thyroid Function Tests: No results for input(s): "TSH", "T4TOTAL", "FREET4", "T3FREE", "THYROIDAB" in the last 72 hours. Anemia Panel: No results for input(s): "VITAMINB12", "FOLATE", "FERRITIN", "TIBC", "IRON", "RETICCTPCT" in the last 72 hours. Urine analysis:    Component Value Date/Time   COLORURINE YELLOW (A) 07/03/2023 1558   APPEARANCEUR CLOUDY (A) 07/03/2023 1558   LABSPEC 1.012 07/03/2023 1558   PHURINE 7.0 07/03/2023 1558   GLUCOSEU NEGATIVE 07/03/2023 1558   HGBUR LARGE (A) 07/03/2023 1558   BILIRUBINUR NEGATIVE 07/03/2023 1558   KETONESUR NEGATIVE 07/03/2023 1558   PROTEINUR NEGATIVE 07/03/2023 1558   NITRITE NEGATIVE 07/03/2023 1558   LEUKOCYTESUR MODERATE (A) 07/03/2023 1558    Radiological Exams on Admission: DG Chest Port 1 View Result Date: 07/03/2023 CLINICAL DATA:  Shortness of breath. EXAM: PORTABLE CHEST 1 VIEW COMPARISON:  Chest radiograph dated 06/06/2023. FINDINGS: Low lung volumes. The heart size is mildly enlarged. Aortic atherosclerosis. Pulmonary vascular congestion with bilateral interstitial prominence. No sizeable pleural effusion or pneumothorax. Partially visualized lower cervical fixation hardware. No acute osseous abnormality. IMPRESSION: Low lung volumes with pulmonary vascular congestion. Bilateral interstitial prominence could reflect pulmonary edema or atypical infection. Electronically Signed   By: Hart Robinsons M.D.   On: 07/03/2023 16:54     EKG: Independently reviewed.  Sinus rhythm  Assessment/Plan Principal Problem:   RSV (respiratory syncytial virus pneumonia)  RSV pneumonia Sepsis secondary to above Sepsis criteria met with fever, leukocytosis, tachycardia.  No overt evidence of endorgan damage. Plan: Admit inpatient, progressive unit Avoid IV fluids for sepsis bundle given history of BuSpar failure Empiric Treatment Rocephin and azithromycin Droplet precautions Daily steroids Bronchodilators Oxygen, wean as tolerated  Acute on chronic hypoxemic respiratory failure 2L O2 at baseline Patient brought in via EMS on BiPAP.  Gas reviewed.  Reassuring.  Weaned to 4 L nasal cannula with baseline 2 L. Continue to wean as tolerated  Chronic diastolic congestive heart failure No evidence of acute exacerbation.  Avoid IV fluids.  Patient given 20 mg IV Lasix in ED.  Reassess tomorrow for diuretic need and tolerance  Chronic hypotension Resume home midodrine 2.5 mg 3 times daily  History of complicated UTI Check urine culture  Paroxysmal atrial fibrillation Heart rate controlled.  Holding home Cardizem for now.  Resume home metoprolol.  Continue Eliquis for anticoagulation.  Essential hypertension Cardizem currently held.  Metoprolol resumed  Parkinson disease  Resume Sinemet  Type II DM with peripheral neuropathy Home dose of glargine 10 units daily Will order glargine 5 units daily with sliding scale insulin and Accu-Cheks before meals and at bedtime  Hypothyroidism --Continue Synthroid   HLD (hyperlipidemia) --Continue Lipitor   Depression with anxiety --cont Celexa and nortriptyline   Obesity (BMI 30-39.9) Body mass index is 32.42 kg/m. Complicates overall care and prognosis.  Recommend lifestyle modifications including physical activity and diet for weight loss and overall long-term health.  Poor functional status Baseline bedbound Patient appears to have poor quality of life.  Will engage  palliative care for further GOC discussion.  Patient is DNR.  CRITICAL CARE Performed by: Tresa Moore   Total critical care time: 45 minutes  Critical care time was exclusive of separately billable procedures and treating other patients.  Critical care was necessary to treat or prevent imminent or life-threatening deterioration.  Critical care was time spent personally by me on the following activities: development of treatment plan with patient and/or surrogate as well as nursing, discussions with consultants, evaluation of patient's response to treatment, examination of patient, obtaining history from patient or surrogate, ordering and performing treatments and interventions, ordering and review of laboratory studies, ordering and review of radiographic studies, pulse oximetry and re-evaluation of patient's condition.   DVT prophylaxis: Eliquis Code Status: DNR limited Family Communication: None Disposition Plan: Anticipate return to skilled nursing facility Consults called: Palliative care Admission status: Inpatient, PCU   Tresa Moore MD [facility]   If 7PM-7AM, please contact night-coverage   07/03/2023, 5:46 PM

## 2023-07-03 NOTE — ED Notes (Signed)
 Pt alert at this time, states he is doing better than he was earlier. Pt repositioned in bed, continues to have fever.

## 2023-07-03 NOTE — ED Notes (Signed)
 O2 decreased to 4L humidified

## 2023-07-03 NOTE — ED Notes (Addendum)
 Pt alert offered dinner tray, but refused, gave diet sprite, took a few bites of applesauce. 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 No glasses or cell phone found in room.   Wife given update as well Raymond Hays [phone]

## 2023-07-03 NOTE — ED Notes (Signed)
CCMD notified

## 2023-07-03 NOTE — ED Notes (Signed)
Assumed care of pt from Melissa RN

## 2023-07-03 NOTE — ED Notes (Signed)
 Pt resting in bed, pt on 4L [place] at this time, Bipap no longer needed, receiving IV antibiotics.

## 2023-07-03 NOTE — ED Notes (Signed)
 Pt asking about belongings, spoke with EMS crew that brought him in states he did not have any personal items except the blanket.

## 2023-07-03 NOTE — Sepsis Progress Note (Signed)
 Code Sepsis protocol being monitored by eLink.

## 2023-07-03 NOTE — Consult Note (Signed)
 CODE SEPSIS - PHARMACY COMMUNICATION  **Broad Spectrum Antibiotics should be administered within 1 hour of Sepsis diagnosis**  Time Code Sepsis Called/Page Received: 0403  Antibiotics Ordered: cefepime + vancomycin  Time of 1st antibiotic administration: 1616  Additional action taken by pharmacy: None    Ronnald Ramp ,PharmD Clinical Pharmacist  07/03/2023  4:29 PM

## 2023-07-03 NOTE — ED Triage Notes (Signed)
 Patient from [facility] with complaints of increased shortness of breath over the past few days. Facility administered a duoneb with no improvement. Upon arrival patient's room air sat 80's.  125 solumedrol 1 Duoneb 2 Albuterol 2g Mag  18G LFA   100.7Ax 80/60 after 600cc of NS 116/74 CO2 18-26

## 2023-07-03 NOTE — ED Provider Notes (Signed)
 [facility] Provider Note    Event Date/Time   First MD Initiated Contact with Patient 07/03/23 1558     (approximate)   History   Shortness of Breath   HPI  Raymond Hays is a 78 year old male with history of COPD, HTN, CHF, neurogenic bladder with suprapubic catheter, A-fib on Eliquis, Parkinson's disease, DM presenting to the emergency department for evaluation of shortness of breath.  Per EMS report, patient has felt short of breath for the past few days.  Room air sat in the low 80s.  Was given DuoNeb, Solu-Medrol, albuterol, 2 g of mag prior to presentation.  Borderline febrile with temp of 100.7 with EMS.   Patient does arrive with a DNR as well as a MOST form recommending limited interventions, specific request to avoid intubation/intensive care.     Physical Exam   Triage Vital Signs: ED Triage Vitals  Encounter Vitals Group     BP --      Systolic BP Percentile --      Diastolic BP Percentile --      Pulse Rate 07/03/23 1556 (!) 114     Resp 07/03/23 1556 (!) 32     Temp --      Temp src --      SpO2 07/03/23 1556 100 %     Weight 07/03/23 1602 215 lb 9.6 oz (97.8 kg)     Height 07/03/23 1602 5\' 8"  (1.727 m)     Head Circumference --      Peak Flow --      Pain Score 07/03/23 1554 0     Pain Loc --      Pain Education --      Exclude from Growth Chart --     Most recent vital signs: Vitals:   07/03/23 1625 07/03/23 1630  BP:  123/74  Pulse:  (!) 113  Resp:  (!) 28  Temp: (!) 102.3 F (39.1 C) (!) 102.6 F (39.2 C)  SpO2:  100%     General: Awake, answers some basic questions CV:  Tachycardic, normal peripheral perfusion  Resp:  Tachypneic with labored respirations, transitioned from CPAP to BiPAP on presentation, diffuse coarse lung sounds, more diminished at the bases Abd:  Nondistended, no appreciable tenderness Neuro:  Symmetric facial movement, fluid speech   ED Results / Procedures / Treatments   Labs (all labs  ordered are listed, but only abnormal results are displayed) Labs Reviewed  RESP PANEL BY RT-PCR (RSV, FLU A&B, COVID)  [id number] - Abnormal; Notable for the following components:      Result Value   Resp Syncytial Virus by PCR POSITIVE (*)    All other components within normal limits  COMPREHENSIVE METABOLIC PANEL WITH GFR - Abnormal; Notable for the following components:   Glucose, Bld 164 (*)    Calcium 8.7 (*)    Albumin 2.9 (*)    All other components within normal limits  CBC WITH DIFFERENTIAL/PLATELET - Abnormal; Notable for the following components:   WBC 14.3 (*)    RBC 3.58 (*)    Hemoglobin 11.1 (*)    HCT 34.5 (*)    RDW 17.6 (*)    Neutro Abs 11.2 (*)    Abs Immature Granulocytes 0.10 (*)    All other components within normal limits  PROTIME-INR - Abnormal; Notable for the following components:   Prothrombin Time 22.5 (*)    INR 2.0 (*)    All other components within normal limits  URINALYSIS, W/ REFLEX TO CULTURE (INFECTION SUSPECTED) - Abnormal; Notable for the following components:   Color, Urine YELLOW (*)    APPearance CLOUDY (*)    Hgb urine dipstick LARGE (*)    Leukocytes,Ua MODERATE (*)    Bacteria, UA RARE (*)    All other components within normal limits  BRAIN NATRIURETIC PEPTIDE - Abnormal; Notable for the following components:   B Natriuretic Peptide 394.2 (*)    All other components within normal limits  BLOOD GAS, VENOUS - Abnormal; Notable for the following components:   Bicarbonate 31.9 (*)    Acid-Base Excess 5.3 (*)    All other components within normal limits  CULTURE, BLOOD (ROUTINE X 2)  CULTURE, BLOOD (ROUTINE X 2)  URINE CULTURE  LACTIC ACID, PLASMA  TROPONIN I (HIGH SENSITIVITY)     EKG EKG independently reviewed interpreted by myself (ER attending) demonstrates:  EKG demonstrates A-fib at a rate of 107, PR 119, QRS 471, no acute ST changes  RADIOLOGY Imaging independently reviewed and interpreted by myself demonstrates:  CXR  demonstrates bilateral interstitial prominence concerning for edema versus atypical infection  PROCEDURES:  Critical Care performed: Yes, see critical care procedure note(s)  CRITICAL CARE Performed by: Trinna Post   Total critical care time: 31 minutes  Critical care time was exclusive of separately billable procedures and treating other patients.  Critical care was necessary to treat or prevent imminent or life-threatening deterioration.  Critical care was time spent personally by me on the following activities: development of treatment plan with patient and/or surrogate as well as nursing, discussions with consultants, evaluation of patient's response to treatment, examination of patient, obtaining history from patient or surrogate, ordering and performing treatments and interventions, ordering and review of laboratory studies, ordering and review of radiographic studies, pulse oximetry and re-evaluation of patient's condition.   Procedures   MEDICATIONS ORDERED IN ED: Medications  vancomycin (VANCOCIN) IVPB 1000 mg/200 mL premix (1,000 mg Intravenous New Bag/Given 07/03/23 1637)  ipratropium-albuterol (DUONEB) 0.5-2.5 (3) MG/3ML nebulizer solution 6 mL (has no administration in time range)  furosemide (LASIX) injection 20 mg (has no administration in time range)  doxycycline (VIBRAMYCIN) 100 mg in sodium chloride 0.9 % 250 mL IVPB (has no administration in time range)  ceFEPIme (MAXIPIME) 2 g in sodium chloride 0.9 % 100 mL IVPB (0 g Intravenous Stopped 07/03/23 1646)  acetaminophen (TYLENOL) tablet 650 mg (650 mg Oral Given 07/03/23 1631)     IMPRESSION / MDM / ASSESSMENT AND PLAN / ED COURSE  I reviewed the triage vital signs and the nursing notes.  Differential diagnosis includes, but is not limited to, pneumonia, CHF exacerbation, COPD exacerbation, viral illness, pneumothorax  Patient's presentation is most consistent with acute presentation with potential threat to life or  bodily function.  78 year old male presenting with shortness of breath, febrile and tachycardic on presentation.  Initially placed on BiPAP, but reassuring VBG, was able to be transitioned to 4 L nasal cannula.  Sepsis orders initiated on presentation with empiric cefepime, vancomycin, doxycycline for suspected respiratory source with recent IV antibiotics.  Labs with leukocytosis though improved from recent admission at 14.3.  RSV test did return positive.  BNP slightly increased from prior at 394.  Clinically without significant lower extremity edema, but will trial small dose of Lasix.  Remains with coarse lung sounds, will also trial additional DuoNebs.  Received steroids with EMS.  Given acute on chronic hypoxic respiratory failure as well as concern for pneumonia meeting sepsis criteria,  do think patient is appropriate for admission.  Will reach out to hospitalist team.  Clinical Course as of 07/03/23 2330  Thu Jul 03, 2023  1739 Reviewed with hospitalist team.  They will evaluate for anticipated admission. [NR]    Clinical Course User Index [NR] Trinna Post, MD     FINAL CLINICAL IMPRESSION(S) / ED DIAGNOSES   Final diagnoses:  Acute on chronic hypoxic respiratory failure (HCC)  Pneumonia due to infectious organism, unspecified laterality, unspecified part of lung  Infection due to respiratory syncytial virus (RSV), unspecified infection type     Rx / DC Orders   ED Discharge Orders     None        Note:  This document was prepared using Dragon voice recognition software and may include unintentional dictation errors.   Trinna Post, MD 07/03/23 [phone]

## 2023-07-03 NOTE — ED Notes (Signed)
Patient taken off bipap and placed on 5L NC.

## 2023-07-04 DIAGNOSIS — J189 Pneumonia, unspecified organism: Secondary | ICD-10-CM

## 2023-07-04 DIAGNOSIS — A419 Sepsis, unspecified organism: Secondary | ICD-10-CM | POA: Diagnosis present

## 2023-07-04 DIAGNOSIS — J121 Respiratory syncytial virus pneumonia: Secondary | ICD-10-CM | POA: Diagnosis not present

## 2023-07-04 DIAGNOSIS — J9621 Acute and chronic respiratory failure with hypoxia: Secondary | ICD-10-CM

## 2023-07-04 DIAGNOSIS — Z515 Encounter for palliative care: Secondary | ICD-10-CM

## 2023-07-04 LAB — CBC
HCT: 33.2 % — ABNORMAL LOW (ref 39.0–52.0)
Hemoglobin: 10.7 g/dL — ABNORMAL LOW (ref 13.0–17.0)
MCH: 30 pg (ref 26.0–34.0)
MCHC: 32.2 g/dL (ref 30.0–36.0)
MCV: 93 fL (ref 80.0–100.0)
Platelets: 224 10*3/uL (ref 150–400)
RBC: 3.57 MIL/uL — ABNORMAL LOW (ref 4.22–5.81)
RDW: 17.5 % — ABNORMAL HIGH (ref 11.5–15.5)
WBC: 9.6 10*3/uL (ref 4.0–10.5)
nRBC: 0 % (ref 0.0–0.2)

## 2023-07-04 LAB — BASIC METABOLIC PANEL WITH GFR
Anion gap: 10 (ref 5–15)
BUN: 31 mg/dL — ABNORMAL HIGH (ref 8–23)
CO2: 25 mmol/L (ref 22–32)
Calcium: 8.9 mg/dL (ref 8.9–10.3)
Chloride: 101 mmol/L (ref 98–111)
Creatinine, Ser: 1.11 mg/dL (ref 0.61–1.24)
GFR, Estimated: >60 mL/min (ref >60)
Glucose, Bld: 192 mg/dL — ABNORMAL HIGH (ref 70–99)
Potassium: 4.1 mmol/L (ref 3.5–5.1)
Sodium: 136 mmol/L (ref 135–145)

## 2023-07-04 LAB — URINE CULTURE

## 2023-07-04 LAB — HEMOGLOBIN: Hemoglobin: 10.4 g/dL — ABNORMAL LOW (ref 13.0–17.0)

## 2023-07-04 LAB — GLUCOSE, CAPILLARY
Glucose-Capillary: 166 mg/dL — ABNORMAL HIGH (ref 70–99)
Glucose-Capillary: 169 mg/dL — ABNORMAL HIGH (ref 70–99)
Glucose-Capillary: 169 mg/dL — ABNORMAL HIGH (ref 70–99)

## 2023-07-04 LAB — CBG MONITORING, ED
Glucose-Capillary: 166 mg/dL — ABNORMAL HIGH (ref 70–99)
Glucose-Capillary: 248 mg/dL — ABNORMAL HIGH (ref 70–99)

## 2023-07-04 MED ORDER — IPRATROPIUM-ALBUTEROL 0.5-2.5 (3) MG/3ML IN SOLN
3.0000 mL | Freq: Four times a day (QID) | RESPIRATORY_TRACT | Status: DC
  Administered 2023-07-05: 3 mL via RESPIRATORY_TRACT
  Filled 2023-07-04: qty 3

## 2023-07-04 MED ORDER — BENZONATATE 100 MG PO CAPS: 100.0000 mg | ORAL_CAPSULE | Freq: Four times a day (QID) | ORAL | Status: DC | PRN

## 2023-07-04 MED ORDER — BENZONATATE 100 MG PO CAPS
100.0000 mg | ORAL_CAPSULE | Freq: Three times a day (TID) | ORAL | Status: DC | PRN
  Administered 2023-07-04 – 2023-07-05 (×2): 100 mg via ORAL
  Filled 2023-07-04 (×2): qty 1

## 2023-07-04 MED ORDER — GUAIFENESIN ER 600 MG PO TB12
600.0000 mg | ORAL_TABLET | Freq: Two times a day (BID) | ORAL | Status: DC
  Administered 2023-07-04 – 2023-07-05 (×2): 600 mg via ORAL
  Filled 2023-07-04 (×2): qty 1

## 2023-07-04 MED ORDER — FUROSEMIDE 20 MG PO TABS
20.0000 mg | ORAL_TABLET | Freq: Every day | ORAL | Status: DC
  Administered 2023-07-04 – 2023-07-05 (×2): 20 mg via ORAL
  Filled 2023-07-04 (×2): qty 1

## 2023-07-04 NOTE — NC FL2 (Signed)
 [place] MEDICAID FL2 LEVEL OF CARE FORM     IDENTIFICATION  Patient Name: Raymond Hays Birthdate: 1945-08-11 Sex: male Admission Date (Current Location): 07/03/2023  [facility] and [place] Number:  Chiropodist and Address:  [facility], [address] [place], [place] [address]      Provider Number: [id number]  Attending Physician Name and Address:  Tresa Moore, MD  Relative Name and Phone Number:       Current Level of Care: Hospital Recommended Level of Care: Skilled Nursing Facility Prior Approval Number:    Date Approved/Denied:   PASRR Number: [id number]  Discharge Plan: SNF    Current Diagnoses: Patient Active Problem List   Diagnosis Date Noted   Severe sepsis without septic shock (HCC) 07/04/2023   RSV (respiratory syncytial virus pneumonia) 07/03/2023   Advance care planning 05/07/2023   Multifocal pneumonia 04/30/2023   COPD exacerbation (HCC) 04/29/2023   Acute on chronic diastolic CHF (congestive heart failure) (HCC) 04/29/2023   Acute on chronic respiratory failure with hypoxia (HCC) 04/29/2023   HLD (hyperlipidemia) 04/29/2023   Abdominal distension 04/29/2023   Acute metabolic encephalopathy 04/29/2023   Pneumonia of right lower lobe due to infectious organism 03/12/2023   Ambulatory dysfunction 03/12/2023   Dehydration 12/07/2022   Paroxysmal atrial fibrillation with rapid ventricular response (HCC) 12/07/2022   Constipation 12/07/2022   Thoracic spondylosis with cord compression (T10) 09/25/2022   Hx of thoracic laminectomy (T11-12) 09/25/2022   Metabolic acidosis 09/15/2022   Aspiration pneumonia of right lower lobe (HCC) 09/15/2022   Complicated UTI (urinary tract infection) 09/14/2022   Hypothyroidism 09/14/2022   Depression with anxiety 09/14/2022   Obesity (BMI 30-39.9) 09/14/2022   Prolonged QT interval 09/14/2022   Suprapubic catheter (HCC) 09/14/2022   Abnormal MRI, thoracic spine (08/23/2022)  09/03/2022   DDD (degenerative disc disease), cervical 08/05/2022   Neurogenic incontinence 08/05/2022   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) 08/05/2022   Foraminal stenosis of thoracic region 08/05/2022   Thoracic Facet Arthropathy 08/05/2022   Lower extremity numbness (Bilateral) 08/05/2022   Weakness of lower extremity (Bilateral) 08/05/2022   Chronic midline low back pain with left-sided sciatica 08/05/2022   Left foot drop 08/05/2022   Lumbar postlaminectomy syndrome 08/05/2022   Poor memory 08/05/2022   Vitamin B12 deficiency 07/25/2022   Pharmacologic therapy 07/24/2022   Disorder of skeletal system 07/24/2022   Problems influencing health status 07/24/2022   Elevated sed rate 07/24/2022   Elevated C-reactive protein (CRP) 07/24/2022   Chronic anticoagulation (ELIQUIS) 07/24/2022   Chronic neck and back pain (1ry area of Pain) (Bilateral) (L>R) 07/24/2022   Abnormal MRI, cervical spine (08/23/2022) 07/24/2022   History of cervical spinal surgery 07/24/2022   History of lumbar surgery 07/24/2022   History of thoracic surgery 07/24/2022   Laryngopharyngeal reflux 06/17/2022   Throat clearing 06/17/2022   Acute respiratory failure (HCC) 04/03/2021   E. 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   Chronic respiratory failure with hypoxia (HCC) 01/10/2020   Impaired functional  mobility, balance, gait, and endurance 09/15/2019   Catheter cystitis (HCC) 10/16/2018   Urinary retention 07/14/2018   COVID-19 virus detected 07/03/2018   Benign prostatic hyperplasia with lower  urinary tract symptoms 08/06/2017   Osteoarthritis of right knee 06/19/2017   S/P orthopedic surgery, follow-up exam 10/25/2016   Tendon rupture of wrist, sequela 01/02/2016   Rotator cuff tear arthropathy of right shoulder 09/28/2015   Acute pain of right knee 08/03/2015   Chronic knee pain (Right) 08/03/2015   Calcific tendinitis of left shoulder 07/05/2015   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/05/2015   Primary osteoarthritis of left shoulder 07/05/2015   Gait difficulty 06/26/2015   Severe mitral regurgitation 06/05/2015   AF (paroxysmal atrial fibrillation) (HCC) 05/31/2015   Chronic pain syndrome 05/31/2015   COPD with acute exacerbation (HCC) 05/31/2015   Essential hypertension 05/31/2015   Atrial fibrillation (HCC) 05/31/2015   Chronic wrist pain (Right) 04/24/2015   Arthritis of left wrist 04/24/2015   Rupture of extensor tendon of left hand 04/24/2015    Orientation RESPIRATION BLADDER Height & Weight     Self, Time, Situation, Place  O2 (Nasal Cannula 3L. Per MD, no need for bipap when he returns to SNF.) Incontinent, Indwelling catheter (Suprapubic catheter) Weight: 215 lb 9.6 oz (97.8 kg) Height:  5\' 8"  (172.7 cm)  BEHAVIORAL SYMPTOMS/MOOD NEUROLOGICAL BOWEL NUTRITION STATUS   (None)  (None) Incontinent Diet (Carb modified)  AMBULATORY STATUS COMMUNICATION OF NEEDS Skin     Verbally Bruising, Other (Comment), PU Stage and Appropriate Care (Erythema/redness. Skin tear on mid coccyx: no dressing listed.)   PU Stage 2 Dressing:  (Left buttocks and both thighs: No dressing listed.)                   Personal Care Assistance Level of Assistance              Functional Limitations Info  Sight, Hearing, Speech Sight Info: Adequate Hearing Info: Adequate Speech Info: Adequate    SPECIAL CARE FACTORS FREQUENCY                       Contractures Contractures Info: Not present    Additional Factors Info  Code Status, Allergies, Isolation  Precautions Code Status Info: DNR Allergies Info: NKDA     Isolation Precautions Info: Droplet/Contact: ESBL, MRSA     Current Medications (07/04/2023):  This is the current hospital active medication list Current Facility-Administered Medications  Medication Dose Route Frequency Provider Last Rate Last Admin   acetaminophen (TYLENOL) tablet 650 mg  650 mg Oral Q6H PRN Sreenath, Sudheer B, MD       albuterol (PROVENTIL) (2.5 MG/3ML) 0.083% nebulizer solution 2.5 mg  2.5 mg Nebulization Q2H PRN Sreenath, Sudheer B, MD       allopurinol (ZYLOPRIM) tablet 200 mg  200 mg Oral Daily Georgeann Oppenheim, Sudheer B, MD   200 mg at 07/04/23 0945   apixaban (ELIQUIS) tablet 5 mg  5 mg Oral BID Lolita Patella B, MD   5 mg at 07/04/23 0932   atorvastatin (LIPITOR) tablet 20 mg  20 mg Oral QHS Sreenath, Sudheer B, MD   20 mg at 07/03/23 2254   azithromycin (ZITHROMAX) 500 mg in sodium chloride 0.9 % 250 mL IVPB  500 mg Intravenous Q24H Lolita Patella B, MD   Stopped at 07/03/23 2205   carbidopa-levodopa (SINEMET IR) 25-100 MG per tablet immediate release 1 tablet  1 tablet Oral TID AC Tresa Moore, MD  1 tablet at 07/04/23 0859   cefTRIAXone (ROCEPHIN) 2 g in sodium chloride 0.9 % 100 mL IVPB  2 g Intravenous Q24H Lolita Patella B, MD   Stopped at 07/03/23 2343   dextromethorphan-guaiFENesin (MUCINEX DM) 30-600 MG per 12 hr tablet 1 tablet  1 tablet Oral BID PRN Lolita Patella B, MD   1 tablet at 07/04/23 1007   diltiazem (CARDIZEM CD) 24 hr capsule 120 mg  120 mg Oral Daily Georgeann Oppenheim, Sudheer B, MD   120 mg at 07/04/23 1610   feeding supplement (ENSURE ENLIVE / ENSURE PLUS) liquid 237 mL  237 mL Oral BID BM Sreenath, Sudheer B, MD   237 mL at 07/04/23 0900   ferrous sulfate tablet 325 mg  325 mg Oral Daily Sreenath, Sudheer B, MD   325 mg at 07/04/23 0935   fluticasone (FLONASE) 50 MCG/ACT nasal spray 2 spray  2 spray Each Nare Daily Sreenath, Sudheer B, MD       fluticasone furoate-vilanterol  (BREO ELLIPTA) 100-25 MCG/ACT 1 puff  1 puff Inhalation Daily Sreenath, Sudheer B, MD       And   umeclidinium bromide (INCRUSE ELLIPTA) 62.5 MCG/ACT 1 puff  1 puff Inhalation Daily Sreenath, Sudheer B, MD       gabapentin (NEURONTIN) capsule 800 mg  800 mg Oral QID Georgeann Oppenheim, Sudheer B, MD   800 mg at 07/04/23 0932   hydrOXYzine (ATARAX) tablet 25 mg  25 mg Oral Q6H PRN Sreenath, Sudheer B, MD       insulin aspart (novoLOG) injection 0-15 Units  0-15 Units Subcutaneous TID WC Lolita Patella B, MD   3 Units at 07/04/23 0858   insulin aspart (novoLOG) injection 0-5 Units  0-5 Units Subcutaneous QHS Lolita Patella B, MD   2 Units at 07/03/23 2314   insulin glargine-yfgn (SEMGLEE) injection 5 Units  5 Units Subcutaneous QHS Sreenath, Sudheer B, MD   5 Units at 07/03/23 2315   ipratropium (ATROVENT) 0.06 % nasal spray 2 spray  2 spray Each Nare TID Lolita Patella B, MD       ipratropium-albuterol (DUONEB) 0.5-2.5 (3) MG/3ML nebulizer solution 3 mL  3 mL Nebulization Q6H Sreenath, Sudheer B, MD   3 mL at 07/04/23 0900   levothyroxine (SYNTHROID) tablet 75 mcg  75 mcg Oral Q0600 Lolita Patella B, MD   75 mcg at 07/04/23 0542   methocarbamol (ROBAXIN) tablet 1,000 mg  1,000 mg Oral Q12H Sreenath, Sudheer B, MD   1,000 mg at 07/04/23 1007   methylPREDNISolone sodium succinate (SOLU-MEDROL) 40 mg/mL injection 40 mg  40 mg Intravenous Daily Lolita Patella B, MD   40 mg at 07/04/23 0946   metoprolol tartrate (LOPRESSOR) tablet 12.5 mg  12.5 mg Oral BID Lolita Patella B, MD   12.5 mg at 07/04/23 0933   midodrine (PROAMATINE) tablet 2.5 mg  2.5 mg Oral TID WC Sreenath, Sudheer B, MD   2.5 mg at 07/04/23 0900   mirabegron ER (MYRBETRIQ) tablet 25 mg  25 mg Oral Daily Georgeann Oppenheim, Sudheer B, MD   25 mg at 07/04/23 0938   ondansetron (ZOFRAN) tablet 4 mg  4 mg Oral Q6H PRN Lolita Patella B, MD       Or   ondansetron (ZOFRAN) injection 4 mg  4 mg Intravenous Q6H PRN Sreenath, Sudheer B, MD        oxybutynin (DITROPAN) tablet 5 mg  5 mg Oral BID Lolita Patella B, MD   5 mg at 07/04/23 [phone]  pantoprazole (PROTONIX) EC tablet 40 mg  40 mg Oral Daily Lolita Patella B, MD   40 mg at 07/04/23 0935   sodium chloride (OCEAN) 0.65 % nasal spray 2 spray  2 spray Each Nare Q6H PRN Lolita Patella B, MD       tiZANidine (ZANAFLEX) tablet 2 mg  2 mg Oral BID PRN Tresa Moore, MD       Current Outpatient Medications  Medication Sig Dispense Refill   acetaminophen (TYLENOL) 325 MG tablet Take 2 tablets (650 mg total) by mouth every 6 (six) hours as needed for mild pain (pain score 1-3), fever or moderate pain (pain score 4-6).     Albuterol Sulfate (PROAIR RESPICLICK) 108 (90 Base) MCG/ACT AEPB Inhale 2 puffs into the lungs every 4 (four) hours as needed (COPD).     allopurinol (ZYLOPRIM) 100 MG tablet Take 200 mg by mouth daily.     apixaban (ELIQUIS) 5 MG TABS tablet Take 5 mg by mouth 2 (two) times daily.      ascorbic acid (VITAMIN C) 500 MG tablet Take 500 mg by mouth daily.     atorvastatin (LIPITOR) 20 MG tablet Take 20 mg by mouth at bedtime.     bisacodyl (DULCOLAX) 10 MG suppository Place 10 mg rectally every 3 (three) days. (HOLD if having loose stools)     calcium carbonate (TUMS - DOSED IN MG ELEMENTAL CALCIUM) 500 MG chewable tablet Chew 2 tablets by mouth daily as needed for indigestion or heartburn.     carbidopa-levodopa (SINEMET IR) 25-100 MG tablet Take 1 tablet by mouth 3 (three) times daily before meals.     Cholecalciferol (VITAMIN D3) 50 MCG (2000 UT) TABS Take 1 tablet by mouth daily.     dextromethorphan-guaiFENesin (MUCINEX DM) 30-600 MG 12hr tablet Take 1 tablet by mouth 2 (two) times daily as needed for cough.     diltiazem (CARDIZEM CD) 240 MG 24 hr capsule Take 1 capsule (240 mg total) by mouth daily.     docusate sodium (COLACE) 100 MG capsule Take 100 mg by mouth daily.     Emollient (CETAPHIL) cream Apply 1 Application topically 2 (two) times daily as  needed.     Eyelid Cleansers (OCUSOFT LID SCRUB ORIGINAL) PADS Place 1 Application into both eyes daily. Wipe both eyelids once per day.     Eyelid Cleansers (OCUSOFT LID SCRUB ORIGINAL) PADS Apply 1 Pad topically every 12 (twelve) hours as needed.     feeding supplement (ENSURE ENLIVE / ENSURE PLUS) LIQD Take 237 mLs by mouth 2 (two) times daily between meals.     ferrous sulfate 324 (65 Fe) MG TBEC Take 1 tablet by mouth daily.     fluticasone (FLONASE) 50 MCG/ACT nasal spray Place 2 sprays into both nostrils daily.     Fluticasone-Umeclidin-Vilant 100-62.5-25 MCG/ACT AEPB Inhale 1 puff into the lungs daily.     furosemide (LASIX) 20 MG tablet Take 2 tablets (40 mg total) by mouth daily.     gabapentin (NEURONTIN) 800 MG tablet Take 800 mg by mouth 4 (four) times daily.     hydrocortisone (ANUSOL-HC) 25 MG suppository Place 25 mg rectally every 4 (four) hours as needed for hemorrhoids or anal itching.     hydrOXYzine (ATARAX/VISTARIL) 25 MG tablet Take 25 mg by mouth every 6 (six) hours as needed for itching.     insulin glargine-yfgn (SEMGLEE) 100 UNIT/ML injection Inject 0.1 mLs (10 Units total) into the skin daily.     ipratropium (  ATROVENT) 0.06 % nasal spray Place 2 sprays into both nostrils 3 (three) times daily.     ipratropium-albuterol (DUONEB) 0.5-2.5 (3) MG/3ML SOLN Take 3 mLs by nebulization every 6 (six) hours.     lactase (LACTAID) 3000 units tablet Take 3,000 Units by mouth 3 (three) times daily with meals.     lactulose (CHRONULAC) 10 GM/15ML solution Take 45 mLs (30 g total) by mouth daily.     levothyroxine (SYNTHROID) 75 MCG tablet Take 75 mcg by mouth daily before breakfast.     lidocaine 4 % Place 1 patch onto the skin daily as needed (pain). (Remove after 12 hours)     liver oil-zinc oxide (DESITIN) 40 % ointment Apply topically 2 (two) times daily. On skin around suprapubic cathether     Menthol, Topical Analgesic, (BIOFREEZE) 4 % GEL Apply 1 application  topically 3  (three) times daily. (Apply to neck)     methocarbamol (ROBAXIN) 500 MG tablet Take 1,000 mg by mouth every 12 (twelve) hours. At 1400     metoprolol tartrate (LOPRESSOR) 25 MG tablet Take 0.5 tablets (12.5 mg total) by mouth 2 (two) times daily. Reduced from 50 mg.     midodrine (PROAMATINE) 2.5 MG tablet Take 1 tablet (2.5 mg total) by mouth 3 (three) times daily with meals.     mirabegron ER (MYRBETRIQ) 25 MG TB24 tablet Take 1 tablet (25 mg total) by mouth daily.     oxybutynin (DITROPAN) 5 MG tablet Take 5 mg by mouth 2 (two) times daily.     pantoprazole (PROTONIX) 40 MG tablet Take 1 tablet (40 mg total) by mouth daily.     polyethylene glycol (MIRALAX / GLYCOLAX) 17 g packet Take 17 g by mouth daily.     pramoxine (SARNA SENSITIVE) 1 % LOTN Apply 1 Application topically 4 (four) times daily as needed.     Probiotic Product (ACIDOPHILUS PROBIOTIC BLEND) CAPS Take 1 capsule by mouth daily.     Propylene Glycol 0.6 % SOLN Place 2 drops into both eyes in the morning, at noon, and at bedtime.     psyllium (METAMUCIL SMOOTH TEXTURE) 58.6 % powder Take 1 packet by mouth 3 (three) times daily. 283 g 1   senna (SENOKOT) 8.6 MG TABS tablet Take 1 tablet (8.6 mg total) by mouth daily as needed for mild constipation. 120 tablet 0   sodium chloride (OCEAN) 0.65 % SOLN nasal spray Place 2 sprays into both nostrils every 6 (six) hours as needed for congestion.     sodium phosphate (FLEET) ENEM Place 133 mLs (1 enema total) rectally daily as needed for severe constipation.     tiZANidine (ZANAFLEX) 2 MG tablet Take 2 mg by mouth 2 (two) times daily as needed for muscle spasms.     triamcinolone cream (KENALOG) 0.1 % Apply 1 Application topically daily as needed. Apply to arms, legs, and back r/t rash       Discharge Medications: Please see discharge summary for a list of discharge medications.  Relevant Imaging Results:  Relevant Lab Results:   Additional Information SS#: [id number]  Margarito Liner, LCSW

## 2023-07-04 NOTE — ED Notes (Signed)
 Attempted to help pt call sister- no answer at this time x2. Pt states he wants to try to nap for awhile, lights dimmed for comfort.

## 2023-07-04 NOTE — ED Notes (Signed)
 Transport requested

## 2023-07-04 NOTE — ED Notes (Signed)
 Patient refusing to take meds until he is ready

## 2023-07-04 NOTE — Progress Notes (Signed)
 PROGRESS NOTE    Raymond Hays  NGE:[id number] DOB: [birth year] DOA: 07/03/2023 PCP: Pearson Grippe, MD    Brief Narrative:   78 y.o. male with medical history significant of COPD, chronic hypoxic respiratory failure on 2 L baseline, hypertension, diastolic congestive heart failure, chronic suprapubic Foley catheter, neurogenic bladder, atrial fibrillation on Eliquis, history of ESBL UTI, spinal stenosis, wheelchair-bound, Parkinson disease, hypertension who presents with shortness of breath and fever from his care home.  Per report patient was found to be short of breath.  Care home staff attempted DuoNeb.  Shortness of breath persisted and patient was transported to ER via EMS.  Initially brought in on BiPAP.  Arterial blood gas done in the emergency room look reassuring.  Patient was transitioned back to nasal cannula and currently saturating well on 3 L.  Laboratory vesication significant for RSV positivity.  Electrolytes overall stable.  White blood cell count elevated.   On my evaluation patient is a chronically ill-appearing male.  Lying in bed.  Answers all questions appropriately.  Mentation intact.  No conversational dyspnea noted.  Patient received DuoNebs, steroids, antibiotics in ED prior to my evaluation.  4/4: Respiratory status improving.  Fevers broke.   Assessment & Plan:   Principal Problem:   RSV (respiratory syncytial virus pneumonia) Active Problems:   Severe sepsis without septic shock (HCC) RSV pneumonia Sepsis secondary to above Sepsis criteria met with fever, leukocytosis, tachycardia.  No overt evidence of endorgan damage.  Cannot totally rule out bacterial coinfection Plan: Downgrade to medical telemetry Continue empiric treatment with Rocephin and azithromycin Solu-Medrol 40 mg IV daily Bronchodilators Droplet precautions Oxygen as needed    Acute on chronic hypoxemic respiratory failure 2L O2 at baseline Patient brought in via EMS on BiPAP.   Gas  reviewed.  Reassuring.   Weaned to 4 L nasal cannula with baseline 2 L. Continue to wean as tolerated, currently 3 L   Chronic diastolic congestive heart failure No evidence of acute exacerbation.  Avoid IV fluids.   Patient given 20 mg IV Lasix in ED.   Start p.o. Lasix 20 mg daily/4   Chronic hypotension Resume home midodrine 2.5 mg 3 times daily   History of complicated UTI Check urine culture, no growth to date   Paroxysmal atrial fibrillation Heart rate controlled.   Holding home Cardizem for now.   Continue home metoprolol.   Continue Eliquis for anticoagulation.   Essential hypertension Cardizem currently held.   Metoprolol resumed   Parkinson disease  Resume Sinemet   Type II DM with peripheral neuropathy Home dose of glargine 10 units daily Glargine 5 units daily Sliding scale CBG before meals and at bedtime   Hypothyroidism --Continue Synthroid   HLD (hyperlipidemia) --Continue Lipitor   Depression with anxiety --cont Celexa and nortriptyline   Obesity (BMI 30-39.9) Body mass index is 32.42 kg/m. Complicates overall care and prognosis.  Recommend lifestyle modifications including physical activity and diet for weight loss and overall long-term health.   Poor functional status Baseline bedbound Patient appears to have poor quality of life.   Will engage palliative care for further GOC discussion.   Patient is DNR.   DVT prophylaxis: Eliquis Code Status: DNR Family Communication: None today Disposition Plan: Status is: Inpatient Remains inpatient appropriate because: Sepsis secondary to viral syndrome   Level of care: Telemetry Medical  Consultants:  Palliative  Procedures:  None  Antimicrobials: Ceftriaxone Azithromycin   Subjective: Seen and examined.  Reports improvement in respiratory status since admission.  Has defervesced.  Objective: Vitals:   07/04/23 0830 07/04/23 0933 07/04/23 1000 07/04/23 1115  BP: 128/85  97/60 (!)  81/66  Pulse: 82 88 80 94  Resp: (!) 24  18 (!) 21  Temp:    98.3 F (36.8 C)  TempSrc:    Oral  SpO2: 100%  95% 96%  Weight:      Height:        Intake/Output Summary (Last 24 hours) at 07/04/2023 1118 Last data filed at 07/03/2023 2103 Gross per 24 hour  Intake 450 ml  Output --  Net 450 ml   Filed Weights   07/03/23 1602  Weight: 97.8 kg    Examination:  General exam: NAD.  Appears chronically ill Respiratory system: Scattered crackles bilaterally.  Normal work of breathing.  3 L Cardiovascular system: S1-S2, regular rate, irregular rhythm, no murmurs, chronic pedal edema Gastrointestinal system: Soft, NT/ND, normal bowel sounds, suprapubic catheter Central nervous system: Alert and oriented. No focal neurological deficits. Extremities: 0 X5 power bilateral lower extremities Skin: No rashes, lesions or ulcers Psychiatry: Judgement and insight appear normal. Mood & affect appropriate.     Data Reviewed: I have personally reviewed following labs and imaging studies  CBC: Recent Labs  Lab 07/03/23 1559 07/04/23 0617  WBC 14.3* 9.6  NEUTROABS 11.2*  --   HGB 11.1* 10.7*  HCT 34.5* 33.2*  MCV 96.4 93.0  PLT 241 224   Basic Metabolic Panel: Recent Labs  Lab 07/03/23 1559 07/04/23 0617  NA 137 136  K 4.2 4.1  CL 100 101  CO2 26 25  GLUCOSE 164* 192*  BUN 22 31*  CREATININE 0.93 1.11  CALCIUM 8.7* 8.9   GFR: Estimated Creatinine Clearance: 61.2 mL/min (by C-G formula based on SCr of 1.11 mg/dL). Liver Function Tests: Recent Labs  Lab 07/03/23 1559  AST 18  ALT <5  ALKPHOS 89  BILITOT 1.0  PROT 6.9  ALBUMIN 2.9*   No results for input(s): "LIPASE", "AMYLASE" in the last 168 hours. No results for input(s): "AMMONIA" in the last 168 hours. Coagulation Profile: Recent Labs  Lab 07/03/23 1559  INR 2.0*   Cardiac Enzymes: No results for input(s): "CKTOTAL", "CKMB", "CKMBINDEX", "TROPONINI" in the last 168 hours. BNP (last 3 results) No results  for input(s): "PROBNP" in the last 8760 hours. HbA1C: No results for input(s): "HGBA1C" in the last 72 hours. CBG: Recent Labs  Lab 07/03/23 2310 07/04/23 0722  GLUCAP 223* 166*   Lipid Profile: No results for input(s): "CHOL", "HDL", "LDLCALC", "TRIG", "CHOLHDL", "LDLDIRECT" in the last 72 hours. Thyroid Function Tests: No results for input(s): "TSH", "T4TOTAL", "FREET4", "T3FREE", "THYROIDAB" in the last 72 hours. Anemia Panel: No results for input(s): "VITAMINB12", "FOLATE", "FERRITIN", "TIBC", "IRON", "RETICCTPCT" in the last 72 hours. Sepsis Labs: Recent Labs  Lab 07/03/23 1558  LATICACIDVEN 1.9    Recent Results (from the past 240 hours)  Blood Culture (routine x 2)     Status: None (Preliminary result)   Collection Time: 07/03/23  4:04 PM   Specimen: Right Antecubital; Blood  Result Value Ref Range Status   Specimen Description RIGHT ANTECUBITAL  Final   Special Requests   Final    BOTTLES DRAWN AEROBIC AND ANAEROBIC Blood Culture adequate volume   Culture   Final    NO GROWTH < 12 HOURS Performed at [facility], [address], [place], [place] [address]    Report Status PENDING  Incomplete  Blood Culture (routine x 2)  Status: None (Preliminary result)   Collection Time: 07/03/23  4:05 PM   Specimen: BLOOD  Result Value Ref Range Status   Specimen Description BLOOD RFA  Final   Special Requests   Final    BOTTLES DRAWN AEROBIC AND ANAEROBIC Blood Culture results may not be optimal due to an inadequate volume of blood received in culture bottles   Culture   Final    NO GROWTH < 24 HOURS Performed at [facility], [address] [place], [place] [address]    Report Status PENDING  Incomplete  Resp panel by RT-PCR (RSV, Flu A&B, Covid) Anterior Nasal Swab     Status: Abnormal   Collection Time: 07/03/23  4:06 PM   Specimen: Anterior Nasal Swab  Result Value Ref Range Status   SARS Coronavirus 2 by RT PCR NEGATIVE NEGATIVE Final     Comment: (NOTE) SARS-CoV-2 target nucleic acids are NOT DETECTED.  The SARS-CoV-2 RNA is generally detectable in upper respiratory specimens during the acute phase of infection. The lowest concentration of SARS-CoV-2 viral copies this assay can detect is 138 copies/mL. A negative result does not preclude SARS-Cov-2 infection and should not be used as the sole basis for treatment or other patient management decisions. A negative result may occur with  improper specimen collection/handling, submission of specimen other than nasopharyngeal swab, presence of viral mutation(s) within the areas targeted by this assay, and inadequate number of viral copies(<138 copies/mL). A negative result must be combined with clinical observations, patient history, and epidemiological information. The expected result is Negative.  Fact Sheet for Patients:  BloggerCourse.com  Fact Sheet for Healthcare Providers:  SeriousBroker.it  This test is no t yet approved or cleared by the [place] FDA and  has been authorized for detection and/or diagnosis of SARS-CoV-2 by FDA under an Emergency Use Authorization (EUA). This EUA will remain  in effect (meaning this test can be used) for the duration of the COVID-19 declaration under Section 564(b)(1) of the Act, 21 U.S.C.section 360bbb-3(b)(1), unless the authorization is terminated  or revoked sooner.       Influenza A by PCR NEGATIVE NEGATIVE Final   Influenza B by PCR NEGATIVE NEGATIVE Final    Comment: (NOTE) The Xpert Xpress SARS-CoV-2/FLU/RSV plus assay is intended as an aid in the diagnosis of influenza from Nasopharyngeal swab specimens and should not be used as a sole basis for treatment. Nasal washings and aspirates are unacceptable for Xpert Xpress SARS-CoV-2/FLU/RSV testing.  Fact Sheet for Patients: BloggerCourse.com  Fact Sheet for Healthcare  Providers: SeriousBroker.it  This test is not yet approved or cleared by the [place] FDA and has been authorized for detection and/or diagnosis of SARS-CoV-2 by FDA under an Emergency Use Authorization (EUA). This EUA will remain in effect (meaning this test can be used) for the duration of the COVID-19 declaration under Section 564(b)(1) of the Act, 21 U.S.C. section 360bbb-3(b)(1), unless the authorization is terminated or revoked.     Resp Syncytial Virus by PCR POSITIVE (A) NEGATIVE Final    Comment: (NOTE) Fact Sheet for Patients: BloggerCourse.com  Fact Sheet for Healthcare Providers: SeriousBroker.it  This test is not yet approved or cleared by the [place] FDA and has been authorized for detection and/or diagnosis of SARS-CoV-2 by FDA under an Emergency Use Authorization (EUA). This EUA will remain in effect (meaning this test can be used) for the duration of the COVID-19 declaration under Section 564(b)(1) of the Act, 21 U.S.C. section 360bbb-3(b)(1), unless the authorization  is terminated or revoked.  Performed at [facility] [address], [place], [place] [address]          Radiology Studies: [facility] Chest [place] 1 View Result Date: 07/03/2023 CLINICAL DATA:  Shortness of breath. EXAM: PORTABLE CHEST 1 VIEW COMPARISON:  Chest radiograph dated 06/06/2023. FINDINGS: Low lung volumes. The heart size is mildly enlarged. Aortic atherosclerosis. Pulmonary vascular congestion with bilateral interstitial prominence. No sizeable pleural effusion or pneumothorax. Partially visualized lower cervical fixation hardware. No acute osseous abnormality. IMPRESSION: Low lung volumes with pulmonary vascular congestion. Bilateral interstitial prominence could reflect pulmonary edema or atypical infection. Electronically Signed   By: Hart Robinsons M.D.   On: 07/03/2023 16:54         Scheduled Meds:  allopurinol  200 mg Oral Daily   apixaban  5 mg Oral BID   atorvastatin  20 mg Oral QHS   carbidopa-levodopa  1 tablet Oral TID AC   diltiazem  120 mg Oral Daily   feeding supplement  237 mL Oral BID BM   ferrous sulfate  325 mg Oral Daily   fluticasone  2 spray Each Nare Daily   fluticasone furoate-vilanterol  1 puff Inhalation Daily   And   umeclidinium bromide  1 puff Inhalation Daily   gabapentin  800 mg Oral QID   insulin aspart  0-15 Units Subcutaneous TID WC   insulin aspart  0-5 Units Subcutaneous QHS   insulin glargine-yfgn  5 Units Subcutaneous QHS   ipratropium  2 spray Each Nare TID   ipratropium-albuterol  3 mL Nebulization Q6H   levothyroxine  75 mcg Oral Q0600   methocarbamol  1,000 mg Oral Q12H   methylPREDNISolone (SOLU-MEDROL) injection  40 mg Intravenous Daily   metoprolol tartrate  12.5 mg Oral BID   midodrine  2.5 mg Oral TID WC   mirabegron ER  25 mg Oral Daily   oxybutynin  5 mg Oral BID   pantoprazole  40 mg Oral Daily   Continuous Infusions:  azithromycin Stopped (07/03/23 2205)   cefTRIAXone (ROCEPHIN)  IV Stopped (07/03/23 2343)     LOS: 1 day    Tresa Moore, MD [facility]   If 7PM-7AM, please contact night-coverage  07/04/2023, 11:18 AM

## 2023-07-04 NOTE — TOC Initial Note (Signed)
 Transition of Care [facility]) - Initial/Assessment Note    Patient Details  Name: Rudolf Blizard MRN: [id number] Date of Birth: April 28, 1945  [facility] CM/SW Contact:    Margarito Liner, LCSW Phone Number: 07/04/2023, 11:23 AM  Clinical Narrative:   CSW met with patient. No family at bedside. CSW introduced role and explained that discharge planning would be discussed. Patient confirmed he is a long-term resident from [facility] and plan is to return at discharge. Per MD, likely discharge tomorrow and he will not require IV abx or bipap. SNF admissions coordinator is aware and agreeable. No further concerns. CSW will continue to follow patient for support and facilitate return to SNF once medically stable.               Expected Discharge Plan: Skilled Nursing Facility Barriers to Discharge: Continued Medical Work up   Patient Goals and CMS Choice     Choice offered to / list presented to : Patient      Expected Discharge Plan and Services     Post Acute Care Choice: Resumption of Svcs/PTA Provider Living arrangements for the past 2 months: Skilled Nursing Facility                                      Prior Living Arrangements/Services Living arrangements for the past 2 months: Skilled Nursing Facility Lives with:: Facility Resident Patient language and need for interpreter reviewed:: Yes Do you feel safe going back to the place where you live?: Yes      Need for Family Participation in Patient Care: Yes (Comment) Care giver support system in place?: Yes (comment)   Criminal Activity/Legal Involvement Pertinent to Current Situation/Hospitalization: No - Comment as needed  Activities of Daily Living      Permission Sought/Granted Permission sought to share information with : Facility Industrial/product designer granted to share information with : Yes, Verbal Permission Granted     Permission granted to share info w AGENCY: [facility]        Emotional Assessment Appearance:: Appears stated age Attitude/Demeanor/Rapport: Engaged Affect (typically observed): Accepting, Irritable Orientation: : Oriented to Self, Oriented to Place, Oriented to  Time, Oriented to Situation Alcohol / Substance Use: Not Applicable Psych Involvement: No (comment)  Admission diagnosis:  RSV (respiratory syncytial virus pneumonia) [J12.1] Severe sepsis without septic shock (HCC) [A41.9, R65.20] Patient Active Problem List   Diagnosis Date Noted   Severe sepsis without septic shock (HCC) 07/04/2023   RSV (respiratory syncytial virus pneumonia) 07/03/2023   Advance care planning 05/07/2023   Multifocal pneumonia 04/30/2023   COPD exacerbation (HCC) 04/29/2023   Acute on chronic diastolic CHF (congestive heart failure) (HCC) 04/29/2023   Acute on chronic respiratory failure with hypoxia (HCC) 04/29/2023   HLD (hyperlipidemia) 04/29/2023   Abdominal distension 04/29/2023   Acute metabolic encephalopathy 04/29/2023   Pneumonia of right lower lobe due to infectious organism 03/12/2023   Ambulatory dysfunction 03/12/2023   Dehydration 12/07/2022   Paroxysmal atrial fibrillation with rapid ventricular response (HCC) 12/07/2022   Constipation 12/07/2022   Thoracic spondylosis with cord compression (T10) 09/25/2022   Hx of thoracic laminectomy (T11-12) 09/25/2022   Metabolic acidosis 09/15/2022   Aspiration pneumonia of right lower lobe (HCC) 09/15/2022   Complicated UTI (urinary tract infection) 09/14/2022   Hypothyroidism 09/14/2022   Depression with anxiety 09/14/2022   Obesity (BMI 30-39.9) 09/14/2022  Prolonged QT interval 09/14/2022   Suprapubic catheter (HCC) 09/14/2022   Abnormal MRI, thoracic spine (08/23/2022) 09/03/2022   DDD (degenerative disc disease), cervical 08/05/2022   Neurogenic incontinence 08/05/2022   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) 08/05/2022   Foraminal stenosis of thoracic region 08/05/2022    Thoracic Facet Arthropathy 08/05/2022   Lower extremity numbness (Bilateral) 08/05/2022   Weakness of lower extremity (Bilateral) 08/05/2022   Chronic midline low back pain with left-sided sciatica 08/05/2022   Left foot drop 08/05/2022   Lumbar postlaminectomy syndrome 08/05/2022   Poor memory 08/05/2022   Vitamin B12 deficiency 07/25/2022   Pharmacologic therapy 07/24/2022   Disorder of skeletal system 07/24/2022   Problems influencing health status 07/24/2022   Elevated sed rate 07/24/2022   Elevated C-reactive protein (CRP) 07/24/2022   Chronic anticoagulation (ELIQUIS) 07/24/2022   Chronic neck and back pain (1ry area of Pain) (Bilateral) (L>R) 07/24/2022   Abnormal MRI, cervical spine (08/23/2022) 07/24/2022   History of cervical spinal surgery 07/24/2022   History of lumbar surgery 07/24/2022   History of thoracic surgery 07/24/2022   Laryngopharyngeal reflux 06/17/2022   Throat clearing 06/17/2022   Acute respiratory failure (HCC) 04/03/2021   E. 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   Chronic respiratory failure with hypoxia (HCC) 01/10/2020   Impaired functional mobility, balance, gait, and endurance 09/15/2019   Catheter cystitis (HCC) 10/16/2018   Urinary retention 07/14/2018   COVID-19 virus detected 07/03/2018   Benign prostatic hyperplasia with lower urinary tract symptoms 08/06/2017   Osteoarthritis of right knee 06/19/2017   S/P orthopedic surgery, follow-up exam 10/25/2016   Tendon rupture of wrist, sequela 01/02/2016   Rotator cuff tear arthropathy of right  shoulder 09/28/2015   Acute pain of right knee 08/03/2015   Chronic knee pain (Right) 08/03/2015   Calcific tendinitis of left shoulder 07/05/2015   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/05/2015   Primary osteoarthritis of left shoulder 07/05/2015   Gait difficulty 06/26/2015   Severe mitral regurgitation 06/05/2015   AF (paroxysmal atrial fibrillation) (HCC) 05/31/2015   Chronic pain syndrome 05/31/2015   COPD with acute exacerbation (HCC) 05/31/2015   Essential hypertension 05/31/2015   Atrial fibrillation (HCC) 05/31/2015   Chronic wrist pain (Right) 04/24/2015   Arthritis of left wrist 04/24/2015   Rupture of extensor tendon of left hand 04/24/2015   PCP:  Pearson Grippe, MD Pharmacy:   [facility] [place], [place] - [address] [place] [address] Phone: [phone] Fax: [phone]  [facility] - [place], [place] - [address] [place] [address] Phone: [phone] Fax: [phone]     Social Drivers of Health (SDOH) Social History: SDOH Screenings   Food Insecurity: No Food Insecurity (06/08/2023)  Housing: Low Risk  (06/11/2023)  Transportation Needs: No Transportation Needs (06/08/2023)  Utilities: Not At Risk (06/08/2023)  Depression (PHQ2-9): Low Risk  (09/25/2022)  Financial Resource Strain: Low Risk  (05/02/2023)  Social Connections: Socially Isolated (  06/11/2023)  Tobacco Use: Medium Risk (07/03/2023)   SDOH Interventions:     Readmission Risk Interventions    07/04/2023   11:21 AM 03/12/2023    2:19 PM 01/12/2021    1:08 PM  Readmission Risk Prevention Plan  Transportation Screening Complete  Complete  PCP or Specialist Appt within 3-5 Days     Social Work Consult for Recovery Care Planning/Counseling     Palliative Care Screening     Medication Review Oceanographer) Complete  Complete  PCP or Specialist appointment  within 3-5 days of discharge Complete  Complete  HRI or Home Care Consult Not Complete  Complete  HRI or Home Care Consult Pt Refusal Comments SNF resident    SW Recovery Care/Counseling Consult Complete  Complete  Palliative Care Screening Not Applicable  Not Applicable  Skilled Nursing Facility Complete  Complete     Information is confidential and restricted. Go to Review Flowsheets to unlock data.

## 2023-07-04 NOTE — Consult Note (Signed)
 Consultation Note Date: 07/04/2023   Patient Name: Raymond Hays  DOB: 11/13/1945  MRN: [id number]  Age / Sex: 78 y.o., male  PCP: Pearson Grippe, MD Referring Physician: Tresa Moore, MD  Reason for Consultation: Establishing goals of care   HPI/Brief Hospital Course: 78 y.o. 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 Fib on Eliquis admitted from [facility] on 07/03/2023 with [facility] and hypoxia.  Met sepsis criteria in ED, found to be RSV (+) started on empiric antibiotic therapy, initially placed on bipap but weaned successfully to [place]   Noted 3 IP admits within 6 months  Palliative medicine was consulted for assisting with goals of care conversations  Subjective:  Extensive chart review has been completed prior to meeting patient including labs, vital signs, imaging, progress notes, orders, and available advanced directive documents from current and previous encounters.  Visited with Mr. Law at his bedside. He is awake, alert and able to engage in conversation.  Introduced myself as a Publishing rights manager as a member of the palliative care team. Explained palliative medicine is specialized medical care for people living with serious illness. It focuses on providing relief from the symptoms and stress of a serious illness. The goal is to improve quality of life for both the patient and the family.   Mr. Rohl provides a brief life review. He is originally from [place], worked various odd jobs. He is currently separated-not divorced and still has contact with his ex spouse. He has 2 sons but mostly involved connected with his son-Seyon. He also has two sisters that he is very close with.  He has been a resident of [facility] for about 4 years, prior he was living at home with  his son. He is able to transfer to wheelchair via sit to stand lift and spends the majority of his day in his room in [facility].  We discussed patient's current illness and what it means in the larger context of patient's on-going co-morbidities. Natural disease trajectory and expectations at EOL were discussed.   Mr. Sheldon confirms DNR/DNI status.  He shares he is unsure if he has completed Advanced Directives in the past. He wishes to appoint his sister-Elizabeth and his son-Lui as HCPOA. Discussed with Mr. Fussell without completed HCPOA-Gail his ex spouse but not divorced would be considered his next of kin. Encouraged completion of documentation if he wishes to appoint his sister and son.  We discussed quality versus quantity of life. Mr. Gallo shares he is "comfortable" with his life as it is currently, continues to fight for his grandchildren. He shares being tired of transferring back and forth to the hospital, he hopes to avoid this in the future but remains open to treating the treatable.  We discussed the role of outpatient palliative care for which he is agreeable to on his return to [facility] order placed.  I discussed importance of continued conversations with family/support persons and all members of their medical team regarding overall plan of care and treatment options ensuring decisions are in alignment with patients goals of care.  All questions/concerns addressed. Emotional support provided to patient/family/support persons. PMT will continue to follow and support patient as needed.  Objective: Primary Diagnoses: Present on Admission:  RSV (respiratory syncytial virus pneumonia)  Severe sepsis without septic shock (HCC)   Physical Exam Constitutional:  General: He is not in acute distress.    Appearance: He is ill-appearing.  Pulmonary:     Effort: Pulmonary effort is normal. No respiratory distress.  Skin:    General: Skin is warm and dry.  Neurological:     Mental Status:  He is alert and oriented to person, place, and time.     Motor: Weakness present.     Vital Signs: BP 102/65   Pulse (!) 103   Temp 98.2 F (36.8 C) (Oral)   Resp 14   Ht 5\' 8"  (1.727 m)   Wt 97.8 kg   SpO2 100%   BMI 32.78 kg/m  Pain Scale: 0-10   Pain Score: 0-No pain  IO: Intake/output summary:  Intake/Output Summary (Last 24 hours) at 07/04/2023 1622 Last data filed at 07/03/2023 2103 Gross per 24 hour  Intake 450 ml  Output --  Net 450 ml    LBM: Last BM Date : 07/04/23 Baseline Weight: Weight: 97.8 kg Most recent weight: Weight: 97.8 kg       Palliative Assessment/Data:40%   Assessment and Plan  SUMMARY OF RECOMMENDATIONS   Outpatient palliative care referral placed  Palliative Prophylaxis:   Bowel Regimen, Delirium Protocol and Frequent Pain Assessment  Thank you for this consult and allowing Palliative Medicine to participate in the care of Abriel Geesey. Palliative medicine will continue to follow and assist as needed.   Time Total: 75 minutes  Time spent includes: Detailed review of medical records (labs, imaging, vital signs), medically appropriate exam (mental status, respiratory, cardiac, skin), discussed with treatment team, counseling and educating patient, family and staff, documenting clinical information, medication management and coordination of care.   Signed by: Leeanne Deed, DNP, AGNP-C Palliative Medicine    Please contact Palliative Medicine Team phone at [phone] for questions and concerns.  For individual provider: See Loretha Stapler

## 2023-07-05 DIAGNOSIS — J121 Respiratory syncytial virus pneumonia: Secondary | ICD-10-CM | POA: Diagnosis not present

## 2023-07-05 LAB — GLUCOSE, CAPILLARY
Glucose-Capillary: 146 mg/dL — ABNORMAL HIGH (ref 70–99)
Glucose-Capillary: 156 mg/dL — ABNORMAL HIGH (ref 70–99)
Glucose-Capillary: 210 mg/dL — ABNORMAL HIGH (ref 70–99)

## 2023-07-05 MED ORDER — PREDNISONE 20 MG PO TABS: 40.0000 mg | ORAL_TABLET | Freq: Every day | ORAL | Status: AC

## 2023-07-05 MED ORDER — AMOXICILLIN-POT CLAVULANATE 875-125 MG PO TABS: 1.0000 | ORAL_TABLET | Freq: Two times a day (BID) | ORAL | Status: AC

## 2023-07-05 NOTE — TOC Transition Note (Signed)
 Transition of Care [facility]) - Discharge Note   Patient Details  Name: Raymond Hays MRN: [id number] Date of Birth: 1945-05-25  Transition of Care [facility]) CM/SW Contact:  Rodney Langton, RN Phone Number: 07/05/2023, 12:15 PM   Clinical Narrative:     Patient with discharge orders back to SNF at [facility].  Spoke with Lyda Jester at [facility], discharge orders and summary faxed to [phone].  Patient will go to room 313A, called son and left a message.  Medical Necessity and facesheet completed and printed, [facility] called for transportation, pick up time will be around 2pm. Bedside nurse is aware.   Final next level of care: Skilled Nursing Facility Barriers to Discharge: Barriers Resolved   Patient Goals and CMS Choice Patient states their goals for this hospitalization and ongoing recovery are:: Return to SNF   Choice offered to / list presented to : Patient      Discharge Placement              Patient chooses bed at: [facility] Patient to be transferred to facility by: [facility] Name of family member notified: Zaydin, son Patient and family notified of of transfer: 07/05/23  Discharge Plan and Services Additional resources added to the After Visit Summary for       Post Acute Care Choice: Resumption of Svcs/PTA Provider                               Social Drivers of Health (SDOH) Interventions SDOH Screenings   Food Insecurity: No Food Insecurity (07/04/2023)  Housing: Low Risk  (07/04/2023)  Transportation Needs: No Transportation Needs (07/04/2023)  Utilities: Not At Risk (07/04/2023)  Depression (PHQ2-9): Low Risk  (09/25/2022)  Financial Resource Strain: Low Risk  (05/02/2023)  Social Connections: Socially Isolated (07/04/2023)  Tobacco Use: Medium Risk (07/03/2023)     Readmission Risk Interventions    07/04/2023   11:21 AM 03/12/2023    2:19 PM 01/12/2021    1:08 PM  Readmission Risk Prevention Plan  Transportation Screening Complete  Complete   PCP or Specialist Appt within 3-5 Days     Social Work Consult for Recovery Care Planning/Counseling     Palliative Care Screening     Medication Review Oceanographer) Complete  Complete  PCP or Specialist appointment within 3-5 days of discharge Complete  Complete  HRI or Home Care Consult Not Complete  Complete  HRI or Home Care Consult Pt Refusal Comments SNF resident    SW Recovery Care/Counseling Consult Complete  Complete  Palliative Care Screening Not Applicable  Not Applicable  Skilled Nursing Facility Complete  Complete     Information is confidential and restricted. Go to Review Flowsheets to unlock data.

## 2023-07-05 NOTE — Progress Notes (Signed)
 Called and gave report to [employer] LPN at [facility], [place] [phone]

## 2023-07-05 NOTE — Progress Notes (Signed)
   07/05/23 1015  Spiritual Encounters  Type of Visit Initial  Referral source Other (comment) (Spiritual Consult)  Reason for visit Advance directives  OnCall Visit No   Chaplain responded to a spiritual consult request for advanced directive education.  I met with the patient, Raymond Hays who shared that he hoped to be going home today. He expressed frustration about his arrival and stay at the hospital. The facility that he resides at sent nothing, he could not see of talk to anyone because he had no phone and no glasses. Martez asked that I leave the documents with him and he would work on them when he goes home. I advised him to refer to the front page for, how to have the documents uploaded into the system as well as directions throughout the document to complete each step.   Valerie Roys [facility] [phone]

## 2023-07-05 NOTE — Discharge Summary (Signed)
 Physician Discharge Summary  Raymond Hays ZOX:[id number] DOB: [birth year] DOA: 07/03/2023  PCP: Pearson Grippe, MD  Admit date: 07/03/2023 Discharge date: 07/05/2023  Admitted From: LTC Disposition:  LTC ([facility])  Recommendations for Outpatient Follow-up:  Follow up with PCP in 1-2 weeks   Home Health:No  Equipment/Devices:Suprapubic catheter (chronic); Oxygen 2L via  (chronic)   Discharge Condition:Stable  CODE STATUS:DNR  Diet recommendation: Carb mod  Brief/Interim Summary:   78 y.o. male with medical history significant of COPD, chronic hypoxic respiratory failure on 2 L baseline, hypertension, diastolic congestive heart failure, chronic suprapubic Foley catheter, neurogenic bladder, atrial fibrillation on Eliquis, history of ESBL UTI, spinal stenosis, wheelchair-bound, Parkinson disease, hypertension who presents with shortness of breath and fever from his care home.  Per report patient was found to be short of breath.  Care home staff attempted DuoNeb.  Shortness of breath persisted and patient was transported to ER via EMS.  Initially brought in on BiPAP.  Arterial blood gas done in the emergency room look reassuring.  Patient was transitioned back to nasal cannula and currently saturating well on 3 L.  Laboratory vesication significant for RSV positivity.  Electrolytes overall stable.  White blood cell count elevated.   On my evaluation patient is a chronically ill-appearing male.  Lying in bed.  Answers all questions appropriately.  Mentation intact.  No conversational dyspnea noted.  Patient received DuoNebs, steroids, antibiotics in ED prior to my evaluation.   4/4: Respiratory status improving.  Fevers broke. 4/5; Respiratory status at baseline.  Appropriate for return to LTC    Discharge Diagnoses:  Principal Problem:   RSV (respiratory syncytial virus pneumonia) Active Problems:   Severe sepsis without septic shock (HCC)  RSV pneumonia Sepsis secondary to  above Sepsis criteria met with fever, leukocytosis, tachycardia.  No overt evidence of endorgan damage.  Cannot totally rule out bacterial coinfection Plan: Discharge back to LTC DC IV steroids Prednisone 40mg  every day x 4 days Augmentin 875/125mg  BID x 4 days At baseline oxygen demand Resume home BD regimen  Acute on chronic hypoxemic respiratory failure 2L O2 at baseline Patient brought in via EMS on BiPAP.   Gas reviewed.  Reassuring.   Weaned to baseline 2L   Chronic diastolic congestive heart failure No evidence of acute exacerbation. Can resume home diuretic regimen   Chronic hypotension Resume home midodrine 2.5 mg 3 times daily   History of complicated UTI Check urine culture, no growth   Paroxysmal atrial fibrillation Heart rate controlled.   Resume home regimen metop and cardizem   Essential hypertension See above   Parkinson disease  Resume Sinemet   Type II DM with peripheral neuropathy Resume home regimen   Hypothyroidism --Continue Synthroid   HLD (hyperlipidemia) --Continue Lipitor   Depression with anxiety --cont Celexa and nortriptyline   Obesity (BMI 30-39.9) Body mass index is 32.42 kg/m. Complicates overall care and prognosis.  Recommend lifestyle modifications including physical activity and diet for weight loss and overall long-term health.   Poor functional status Baseline bedbound Patient appears to have poor quality of life.   Appreciate palliative involvement Remains DNR  Discharge Instructions  Discharge Instructions     Diet - low sodium heart healthy   Complete by: As directed    Increase activity slowly   Complete by: As directed    No wound care   Complete by: As directed       Allergies as of 07/05/2023   No Known Allergies  Medication List     TAKE these medications    acetaminophen 325 MG tablet Commonly known as: TYLENOL Take 2 tablets (650 mg total) by mouth every 6 (six) hours as needed for mild  pain (pain score 1-3), fever or moderate pain (pain score 4-6).   Acidophilus Probiotic Blend Caps Take 1 capsule by mouth daily.   Albuterol Sulfate 108 (90 Base) MCG/ACT Aepb Commonly known as: PROAIR RESPICLICK Inhale 2 puffs into the lungs every 4 (four) hours as needed (COPD).   allopurinol 100 MG tablet Commonly known as: ZYLOPRIM Take 200 mg by mouth daily.   amoxicillin-clavulanate 875-125 MG tablet Commonly known as: AUGMENTIN Take 1 tablet by mouth 2 (two) times daily for 4 days.   apixaban 5 MG Tabs tablet Commonly known as: ELIQUIS Take 5 mg by mouth 2 (two) times daily.   ascorbic acid 500 MG tablet Commonly known as: VITAMIN C Take 500 mg by mouth daily.   atorvastatin 20 MG tablet Commonly known as: LIPITOR Take 20 mg by mouth at bedtime.   Biofreeze 4 % Gel Generic drug: Menthol (Topical Analgesic) Apply 1 application  topically 3 (three) times daily. (Apply to neck)   bisacodyl 10 MG suppository Commonly known as: DULCOLAX Place 10 mg rectally every 3 (three) days. (HOLD if having loose stools)   calcium carbonate 500 MG chewable tablet Commonly known as: TUMS - dosed in mg elemental calcium Chew 2 tablets by mouth daily as needed for indigestion or heartburn.   carbidopa-levodopa 25-100 MG tablet Commonly known as: SINEMET IR Take 1 tablet by mouth 3 (three) times daily before meals.   cetaphil cream Apply 1 Application topically 2 (two) times daily as needed.   dextromethorphan-guaiFENesin 30-600 MG 12hr tablet Commonly known as: MUCINEX DM Take 1 tablet by mouth 2 (two) times daily as needed for cough.   diltiazem 240 MG 24 hr capsule Commonly known as: CARDIZEM CD Take 1 capsule (240 mg total) by mouth daily.   docusate sodium 100 MG capsule Commonly known as: COLACE Take 100 mg by mouth daily.   feeding supplement Liqd Take 237 mLs by mouth 2 (two) times daily between meals.   ferrous sulfate 324 (65 Fe) MG Tbec Take 1 tablet by  mouth daily.   fluticasone 50 MCG/ACT nasal spray Commonly known as: FLONASE Place 2 sprays into both nostrils daily.   Fluticasone-Umeclidin-Vilant 100-62.5-25 MCG/ACT Aepb Inhale 1 puff into the lungs daily.   furosemide 20 MG tablet Commonly known as: LASIX Take 2 tablets (40 mg total) by mouth daily.   gabapentin 800 MG tablet Commonly known as: NEURONTIN Take 800 mg by mouth 4 (four) times daily.   hydrocortisone 25 MG suppository Commonly known as: ANUSOL-HC Place 25 mg rectally every 4 (four) hours as needed for hemorrhoids or anal itching.   hydrOXYzine 25 MG tablet Commonly known as: ATARAX Take 25 mg by mouth every 6 (six) hours as needed for itching.   insulin glargine-yfgn 100 UNIT/ML injection Commonly known as: SEMGLEE Inject 0.1 mLs (10 Units total) into the skin daily.   ipratropium 0.06 % nasal spray Commonly known as: ATROVENT Place 2 sprays into both nostrils 3 (three) times daily.   ipratropium-albuterol 0.5-2.5 (3) MG/3ML Soln Commonly known as: DUONEB Take 3 mLs by nebulization every 6 (six) hours.   lactase 3000 units tablet Commonly known as: LACTAID Take 3,000 Units by mouth 3 (three) times daily with meals.   lactulose 10 GM/15ML solution Commonly known as: CHRONULAC Take 45  mLs (30 g total) by mouth daily.   levothyroxine 75 MCG tablet Commonly known as: SYNTHROID Take 75 mcg by mouth daily before breakfast.   lidocaine 4 % Place 1 patch onto the skin daily as needed (pain). (Remove after 12 hours)   liver oil-zinc oxide 40 % ointment Commonly known as: DESITIN Apply topically 2 (two) times daily. On skin around suprapubic cathether   Metamucil Smooth Texture 58.6 % powder Generic drug: psyllium Take 1 packet by mouth 3 (three) times daily.   methocarbamol 500 MG tablet Commonly known as: ROBAXIN Take 1,000 mg by mouth every 12 (twelve) hours. At 1400   metoprolol tartrate 25 MG tablet Commonly known as: LOPRESSOR Take 0.5  tablets (12.5 mg total) by mouth 2 (two) times daily. Reduced from 50 mg.   midodrine 2.5 MG tablet Commonly known as: PROAMATINE Take 1 tablet (2.5 mg total) by mouth 3 (three) times daily with meals.   mirabegron ER 25 MG Tb24 tablet Commonly known as: MYRBETRIQ Take 1 tablet (25 mg total) by mouth daily.   OcuSoft Lid Scrub Original Pads Place 1 Application into both eyes daily. Wipe both eyelids once per day.   oxybutynin 5 MG tablet Commonly known as: DITROPAN Take 5 mg by mouth 2 (two) times daily.   pantoprazole 40 MG tablet Commonly known as: PROTONIX Take 1 tablet (40 mg total) by mouth daily.   polyethylene glycol 17 g packet Commonly known as: MIRALAX / GLYCOLAX Take 17 g by mouth daily.   pramoxine 1 % Lotn Commonly known as: SARNA SENSITIVE Apply 1 Application topically 4 (four) times daily as needed.   predniSONE 20 MG tablet Commonly known as: DELTASONE Take 2 tablets (40 mg total) by mouth daily with breakfast for 4 days.   Propylene Glycol 0.6 % Soln Place 2 drops into both eyes in the morning, at noon, and at bedtime.   senna 8.6 MG Tabs tablet Commonly known as: SENOKOT Take 1 tablet (8.6 mg total) by mouth daily as needed for mild constipation.   sodium chloride 0.65 % Soln nasal spray Commonly known as: OCEAN Place 2 sprays into both nostrils every 6 (six) hours as needed for congestion.   sodium phosphate Enem Place 133 mLs (1 enema total) rectally daily as needed for severe constipation.   tiZANidine 2 MG tablet Commonly known as: ZANAFLEX Take 2 mg by mouth 2 (two) times daily as needed for muscle spasms.   triamcinolone cream 0.1 % Commonly known as: KENALOG Apply 1 Application topically daily as needed. Apply to arms, legs, and back r/t rash   Vitamin D3 50 MCG (2000 UT) Tabs Take 1 tablet by mouth daily.        Contact information for after-discharge care     Destination     [facility] .   Service:  Skilled Nursing Contact information: [address] [place] [address] [phone]                    No Known Allergies  Consultations: Palliative care   Procedures/Studies: [facility] Chest Port 1 View Result Date: 07/03/2023 CLINICAL DATA:  Shortness of breath. EXAM: PORTABLE CHEST 1 VIEW COMPARISON:  Chest radiograph dated 06/06/2023. FINDINGS: Low lung volumes. The heart size is mildly enlarged. Aortic atherosclerosis. Pulmonary vascular congestion with bilateral interstitial prominence. No sizeable pleural effusion or pneumothorax. Partially visualized lower cervical fixation hardware. No acute osseous abnormality. IMPRESSION: Low lung volumes with pulmonary vascular congestion. Bilateral interstitial prominence  could reflect pulmonary edema or atypical infection. Electronically Signed   By: Hart Robinsons M.D.   On: 07/03/2023 16:54   US Venous Img Lower Bilateral (DVT) Result Date: 06/07/2023 CLINICAL DATA:  Bilateral lower extremity edema. EXAM: BILATERAL LOWER EXTREMITY VENOUS DOPPLER ULTRASOUND TECHNIQUE: Gray-scale sonography with graded compression, as well as color Doppler and duplex ultrasound were performed to evaluate the lower extremity deep venous systems from the level of the common femoral vein and including the common femoral, femoral, profunda femoral, popliteal and calf veins including the posterior tibial, peroneal and gastrocnemius veins when visible. The superficial great saphenous vein was also interrogated. Spectral Doppler was utilized to evaluate flow at rest and with distal augmentation maneuvers in the common femoral, femoral and popliteal veins. COMPARISON:  02/02/2021 FINDINGS: RIGHT LOWER EXTREMITY Common Femoral Vein: No evidence of thrombus. Normal compressibility, respiratory phasicity and response to augmentation. Saphenofemoral Junction: No evidence of thrombus. Normal compressibility and flow on color Doppler imaging. Profunda Femoral  Vein: No evidence of thrombus. Normal compressibility and flow on color Doppler imaging. Femoral Vein: No evidence of thrombus. Normal compressibility, respiratory phasicity and response to augmentation. Popliteal Vein: No evidence of thrombus. Normal compressibility, respiratory phasicity and response to augmentation. Calf Veins: No evidence of thrombus. Normal compressibility and flow on color Doppler imaging. Other Findings:  None. LEFT LOWER EXTREMITY Common Femoral Vein: No evidence of thrombus. Normal compressibility, respiratory phasicity and response to augmentation. Saphenofemoral Junction: No evidence of thrombus. Normal compressibility and flow on color Doppler imaging. Profunda Femoral Vein: No evidence of thrombus. Normal compressibility and flow on color Doppler imaging. Femoral Vein: No evidence of thrombus. Normal compressibility, respiratory phasicity and response to augmentation. Popliteal Vein: No evidence of thrombus. Normal compressibility, respiratory phasicity and response to augmentation. Calf Veins: No evidence of thrombus. Normal compressibility and flow on color Doppler imaging. Other Findings:  None. IMPRESSION: No evidence of deep venous thrombosis in either lower extremity. Electronically Signed   By: Kennith Center M.D.   On: 06/07/2023 07:51   CT HEAD WO CONTRAST ( ) Result Date: 06/06/2023 CLINICAL DATA:  Delirium EXAM: CT HEAD WITHOUT CONTRAST TECHNIQUE: Contiguous axial images were obtained from the base of the skull through the vertex without intravenous contrast. RADIATION DOSE REDUCTION: This exam was performed according to the departmental dose-optimization program which includes automated exposure control, adjustment of the mA and/or kV according to patient size and/or use of iterative reconstruction technique. COMPARISON:  CT head 04/29/2023. FINDINGS: Brain: No evidence of acute infarction, hemorrhage, hydrocephalus, extra-axial collection or mass lesion/mass effect.  Remote left frontal infarct. Small remote right cerebellar infarct. Vascular: No hyperdense vessel. Skull: No acute fracture. Sinuses/Orbits: Clear sinuses.  No acute orbital findings. Other: No mastoid effusions. IMPRESSION: 1. No evidence of acute intracranial abnormality. 2. Remote left frontal and right cerebellar infarcts. Electronically Signed   By: Feliberto Harts M.D.   On: 06/06/2023 21:34   DG Chest Port 1 View Result Date: 06/06/2023 CLINICAL DATA:  Questionable sepsis EXAM: PORTABLE CHEST 1 VIEW COMPARISON:  Chest x-ray 05/01/2023 FINDINGS: Cardiac silhouette is enlarged, unchanged. The lungs are clear. There is no pleural effusion or pneumothorax. No acute fractures are seen. Orthopedic hardware is seen overlying the mandible. IMPRESSION: 1. No active disease. 2. Stable enlargement of the cardiac silhouette. Electronically Signed   By: Darliss Cheney M.D.   On: 06/06/2023 17:57      Subjective: Seen and examined on day of discharge.  Stable, appropriate for return to LTC  Discharge Exam:  Vitals:   07/05/23 0747 07/05/23 0758  BP:  115/67  Pulse:  99  Resp:  14  Temp:  98.1 F (36.7 C)  SpO2: 94% 97%   Vitals:   07/04/23 2326 07/05/23 0703 07/05/23 0747 07/05/23 0758  BP: 113/70 107/74  115/67  Pulse: (!) 110 96  99  Resp:  20  14  Temp:  97.6 F (36.4 C)  98.1 F (36.7 C)  TempSrc:  Oral    SpO2: 96% 95% 94% 97%  Weight:      Height:        General: Pt is alert, awake, not in acute distress Cardiovascular: RRR, S1/S2 +, no rubs, no gallops Respiratory: CTA bilaterally, no wheezing, no rhonchi Abdominal: Soft, NT, ND, bowel sounds + Extremities: no edema, no cyanosis    The results of significant diagnostics from this hospitalization (including imaging, microbiology, ancillary and laboratory) are listed below for reference.     Microbiology: Recent Results (from the past 240 hours)  Urine Culture     Status: Abnormal   Collection Time: 07/03/23  3:58 PM    Specimen: Urine, Random  Result Value Ref Range Status   Specimen Description   Final    URINE, RANDOM Performed at [facility], [address] [place], [place] [address]    Special Requests   Final    NONE Reflexed from [id number] Performed at [facility], [address] [place], [place] [address]    Culture MULTIPLE SPECIES PRESENT, SUGGEST RECOLLECTION (A)  Final   Report Status 07/04/2023 FINAL  Final  Blood Culture (routine x 2)     Status: None (Preliminary result)   Collection Time: 07/03/23  4:04 PM   Specimen: Right Antecubital; Blood  Result Value Ref Range Status   Specimen Description RIGHT ANTECUBITAL  Final   Special Requests   Final    BOTTLES DRAWN AEROBIC AND ANAEROBIC Blood Culture adequate volume   Culture   Final    NO GROWTH 2 DAYS Performed at [facility], [address], [place], [place] [address]    Report Status PENDING  Incomplete  Blood Culture (routine x 2)     Status: None (Preliminary result)   Collection Time: 07/03/23  4:05 PM   Specimen: BLOOD  Result Value Ref Range Status   Specimen Description BLOOD RFA  Final   Special Requests   Final    BOTTLES DRAWN AEROBIC AND ANAEROBIC Blood Culture results may not be optimal due to an inadequate volume of blood received in culture bottles   Culture   Final    NO GROWTH 2 DAYS Performed at [facility], [address], [place], [place] [address]    Report Status PENDING  Incomplete  Resp panel by RT-PCR (RSV, Flu A&B, Covid) Anterior Nasal Swab     Status: Abnormal   Collection Time: 07/03/23  4:06 PM   Specimen: Anterior Nasal Swab  Result Value Ref Range Status   SARS Coronavirus 2 by RT PCR NEGATIVE NEGATIVE Final    Comment: (NOTE) SARS-CoV-2 target nucleic acids are NOT DETECTED.  The SARS-CoV-2 RNA is generally detectable in upper respiratory specimens during the acute phase of infection. The lowest concentration of SARS-CoV-2 viral copies this assay  can detect is 138 copies/mL. A negative result does not preclude SARS-Cov-2 infection and should not be used as the sole basis for treatment or other patient management decisions. A negative result may occur with  improper specimen collection/handling, submission of  specimen other than nasopharyngeal swab, presence of viral mutation(s) within the areas targeted by this assay, and inadequate number of viral copies(<138 copies/mL). A negative result must be combined with clinical observations, patient history, and epidemiological information. The expected result is Negative.  Fact Sheet for Patients:  BloggerCourse.com  Fact Sheet for Healthcare Providers:  SeriousBroker.it  This test is no t yet approved or cleared by the [place] FDA and  has been authorized for detection and/or diagnosis of SARS-CoV-2 by FDA under an Emergency Use Authorization (EUA). This EUA will remain  in effect (meaning this test can be used) for the duration of the COVID-19 declaration under Section 564(b)(1) of the Act, 21 U.S.C.section 360bbb-3(b)(1), unless the authorization is terminated  or revoked sooner.       Influenza A by PCR NEGATIVE NEGATIVE Final   Influenza B by PCR NEGATIVE NEGATIVE Final    Comment: (NOTE) The Xpert Xpress SARS-CoV-2/FLU/RSV plus assay is intended as an aid in the diagnosis of influenza from Nasopharyngeal swab specimens and should not be used as a sole basis for treatment. Nasal washings and aspirates are unacceptable for Xpert Xpress SARS-CoV-2/FLU/RSV testing.  Fact Sheet for Patients: BloggerCourse.com  Fact Sheet for Healthcare Providers: SeriousBroker.it  This test is not yet approved or cleared by the [place] FDA and has been authorized for detection and/or diagnosis of SARS-CoV-2 by FDA under an Emergency Use Authorization (EUA). This EUA will  remain in effect (meaning this test can be used) for the duration of the COVID-19 declaration under Section 564(b)(1) of the Act, 21 U.S.C. section 360bbb-3(b)(1), unless the authorization is terminated or revoked.     Resp Syncytial Virus by PCR POSITIVE (A) NEGATIVE Final    Comment: (NOTE) Fact Sheet for Patients: BloggerCourse.com  Fact Sheet for Healthcare Providers: SeriousBroker.it  This test is not yet approved or cleared by the [place] FDA and has been authorized for detection and/or diagnosis of SARS-CoV-2 by FDA under an Emergency Use Authorization (EUA). This EUA will remain in effect (meaning this test can be used) for the duration of the COVID-19 declaration under Section 564(b)(1) of the Act, 21 U.S.C. section 360bbb-3(b)(1), unless the authorization is terminated or revoked.  Performed at [facility], [address] [place], [place] [address]      Labs: BNP (last 3 results) Recent Labs    04/29/23 1528 06/06/23 1414 07/03/23 1559  BNP 152.3* 320.8* 394.2*   Basic Metabolic Panel: Recent Labs  Lab 07/03/23 1559 07/04/23 0617  NA 137 136  K 4.2 4.1  CL 100 101  CO2 26 25  GLUCOSE 164* 192*  BUN 22 31*  CREATININE 0.93 1.11  CALCIUM 8.7* 8.9   Liver Function Tests: Recent Labs  Lab 07/03/23 1559  AST 18  ALT <5  ALKPHOS 89  BILITOT 1.0  PROT 6.9  ALBUMIN 2.9*   No results for input(s): "LIPASE", "AMYLASE" in the last 168 hours. No results for input(s): "AMMONIA" in the last 168 hours. CBC: Recent Labs  Lab 07/03/23 1559 07/04/23 0617 07/04/23 1112  WBC 14.3* 9.6  --   NEUTROABS 11.2*  --   --   HGB 11.1* 10.7* 10.4*  HCT 34.5* 33.2*  --   MCV 96.4 93.0  --   PLT 241 224  --    Cardiac Enzymes: No results for input(s): "CKTOTAL", "CKMB", "CKMBINDEX", "TROPONINI" in the last 168 hours. BNP: Invalid input(s): "POCBNP" CBG: Recent Labs  Lab 07/04/23 1658  07/04/23 1705 07/04/23 2121  07/05/23 0659 07/05/23 0729  GLUCAP 169* 166* 169* 156* 146*   D-Dimer No results for input(s): "DDIMER" in the last 72 hours. Hgb A1c No results for input(s): "HGBA1C" in the last 72 hours. Lipid Profile No results for input(s): "CHOL", "HDL", "LDLCALC", "TRIG", "CHOLHDL", "LDLDIRECT" in the last 72 hours. Thyroid function studies No results for input(s): "TSH", "T4TOTAL", "T3FREE", "THYROIDAB" in the last 72 hours.  Invalid input(s): "FREET3" Anemia work up No results for input(s): "VITAMINB12", "FOLATE", "FERRITIN", "TIBC", "IRON", "RETICCTPCT" in the last 72 hours. Urinalysis    Component Value Date/Time   COLORURINE YELLOW (A) 07/03/2023 1558   APPEARANCEUR CLOUDY (A) 07/03/2023 1558   LABSPEC 1.012 07/03/2023 1558   PHURINE 7.0 07/03/2023 1558   GLUCOSEU NEGATIVE 07/03/2023 1558   HGBUR LARGE (A) 07/03/2023 1558   BILIRUBINUR NEGATIVE 07/03/2023 1558   KETONESUR NEGATIVE 07/03/2023 1558   PROTEINUR NEGATIVE 07/03/2023 1558   NITRITE NEGATIVE 07/03/2023 1558   LEUKOCYTESUR MODERATE (A) 07/03/2023 1558   Sepsis Labs Recent Labs  Lab 07/03/23 1559 07/04/23 0617  WBC 14.3* 9.6   Microbiology Recent Results (from the past 240 hours)  Urine Culture     Status: Abnormal   Collection Time: 07/03/23  3:58 PM   Specimen: Urine, Random  Result Value Ref Range Status   Specimen Description   Final    URINE, RANDOM Performed at [facility], [address] [place], [place] [address]    Special Requests   Final    NONE Reflexed from [id number] Performed at [facility], [address] [place], [place] [address]    Culture MULTIPLE SPECIES PRESENT, SUGGEST RECOLLECTION (A)  Final   Report Status 07/04/2023 FINAL  Final  Blood Culture (routine x 2)     Status: None (Preliminary result)   Collection Time: 07/03/23  4:04 PM   Specimen: Right Antecubital; Blood  Result Value Ref Range Status   Specimen Description RIGHT  ANTECUBITAL  Final   Special Requests   Final    BOTTLES DRAWN AEROBIC AND ANAEROBIC Blood Culture adequate volume   Culture   Final    NO GROWTH 2 DAYS Performed at [facility], [address] [place], [place] [address]    Report Status PENDING  Incomplete  Blood Culture (routine x 2)     Status: None (Preliminary result)   Collection Time: 07/03/23  4:05 PM   Specimen: BLOOD  Result Value Ref Range Status   Specimen Description BLOOD RFA  Final   Special Requests   Final    BOTTLES DRAWN AEROBIC AND ANAEROBIC Blood Culture results may not be optimal due to an inadequate volume of blood received in culture bottles   Culture   Final    NO GROWTH 2 DAYS Performed at [facility], [address] [place], [place] [address]    Report Status PENDING  Incomplete  Resp panel by RT-PCR (RSV, Flu A&B, Covid) Anterior Nasal Swab     Status: Abnormal   Collection Time: 07/03/23  4:06 PM   Specimen: Anterior Nasal Swab  Result Value Ref Range Status   SARS Coronavirus 2 by RT PCR NEGATIVE NEGATIVE Final    Comment: (NOTE) SARS-CoV-2 target nucleic acids are NOT DETECTED.  The SARS-CoV-2 RNA is generally detectable in upper respiratory specimens during the acute phase of infection. The lowest concentration of SARS-CoV-2 viral copies this assay can detect is 138 copies/mL. A negative result does not preclude SARS-Cov-2 infection and should not be used as  the sole basis for treatment or other patient management decisions. A negative result may occur with  improper specimen collection/handling, submission of specimen other than nasopharyngeal swab, presence of viral mutation(s) within the areas targeted by this assay, and inadequate number of viral copies(<138 copies/mL). A negative result must be combined with clinical observations, patient history, and epidemiological information. The expected result is Negative.  Fact Sheet for Patients:   BloggerCourse.com  Fact Sheet for Healthcare Providers:  SeriousBroker.it  This test is no t yet approved or cleared by the [place] FDA and  has been authorized for detection and/or diagnosis of SARS-CoV-2 by FDA under an Emergency Use Authorization (EUA). This EUA will remain  in effect (meaning this test can be used) for the duration of the COVID-19 declaration under Section 564(b)(1) of the Act, 21 U.S.C.section 360bbb-3(b)(1), unless the authorization is terminated  or revoked sooner.       Influenza A by PCR NEGATIVE NEGATIVE Final   Influenza B by PCR NEGATIVE NEGATIVE Final    Comment: (NOTE) The Xpert Xpress SARS-CoV-2/FLU/RSV plus assay is intended as an aid in the diagnosis of influenza from Nasopharyngeal swab specimens and should not be used as a sole basis for treatment. Nasal washings and aspirates are unacceptable for Xpert Xpress SARS-CoV-2/FLU/RSV testing.  Fact Sheet for Patients: BloggerCourse.com  Fact Sheet for Healthcare Providers: SeriousBroker.it  This test is not yet approved or cleared by the [place] FDA and has been authorized for detection and/or diagnosis of SARS-CoV-2 by FDA under an Emergency Use Authorization (EUA). This EUA will remain in effect (meaning this test can be used) for the duration of the COVID-19 declaration under Section 564(b)(1) of the Act, 21 U.S.C. section 360bbb-3(b)(1), unless the authorization is terminated or revoked.     Resp Syncytial Virus by PCR POSITIVE (A) NEGATIVE Final    Comment: (NOTE) Fact Sheet for Patients: BloggerCourse.com  Fact Sheet for Healthcare Providers: SeriousBroker.it  This test is not yet approved or cleared by the [place] FDA and has been authorized for detection and/or diagnosis of SARS-CoV-2 by FDA under an Emergency Use  Authorization (EUA). This EUA will remain in effect (meaning this test can be used) for the duration of the COVID-19 declaration under Section 564(b)(1) of the Act, 21 U.S.C. section 360bbb-3(b)(1), unless the authorization is terminated or revoked.  Performed at [facility], [address] [place], [place] [address]      Time coordinating discharge: Over 30 minutes  SIGNED:   Tresa Moore, MD  [facility] 07/05/2023, 8:58 AM Pager   If 7PM-7AM, please contact night-coverage

## 2023-07-08 LAB — CULTURE, BLOOD (ROUTINE X 2)
Culture: NO GROWTH
Culture: NO GROWTH
Special Requests: ADEQUATE

## 2023-09-12 ENCOUNTER — Emergency Department

## 2023-09-12 ENCOUNTER — Inpatient Hospital Stay
Admission: EM | Admit: 2023-09-12 | Discharge: 2023-09-17 | DRG: 193 | Disposition: A | Discharge status: Skilled Nursing Facility | Attending: Student in an Organized Health Care Education/Training Program | Admitting: Student in an Organized Health Care Education/Training Program

## 2023-09-12 ENCOUNTER — Other Ambulatory Visit: Payer: Self-pay

## 2023-09-12 DIAGNOSIS — R404 Transient alteration of awareness: Principal | ICD-10-CM

## 2023-09-12 DIAGNOSIS — G20A1 Parkinson's disease without dyskinesia, without mention of fluctuations: Secondary | ICD-10-CM | POA: Diagnosis present

## 2023-09-12 DIAGNOSIS — Z87891 Personal history of nicotine dependence: Secondary | ICD-10-CM

## 2023-09-12 DIAGNOSIS — E872 Acidosis, unspecified: Secondary | ICD-10-CM | POA: Diagnosis not present

## 2023-09-12 DIAGNOSIS — K219 Gastro-esophageal reflux disease without esophagitis: Secondary | ICD-10-CM | POA: Diagnosis present

## 2023-09-12 DIAGNOSIS — I480 Paroxysmal atrial fibrillation: Secondary | ICD-10-CM | POA: Diagnosis present

## 2023-09-12 DIAGNOSIS — Z6831 Body mass index (BMI) 31.0-31.9, adult: Secondary | ICD-10-CM

## 2023-09-12 DIAGNOSIS — K5900 Constipation, unspecified: Secondary | ICD-10-CM | POA: Diagnosis present

## 2023-09-12 DIAGNOSIS — J441 Chronic obstructive pulmonary disease with (acute) exacerbation: Secondary | ICD-10-CM | POA: Diagnosis present

## 2023-09-12 DIAGNOSIS — J189 Pneumonia, unspecified organism: Secondary | ICD-10-CM | POA: Diagnosis not present

## 2023-09-12 DIAGNOSIS — L89322 Pressure ulcer of left buttock, stage 2: Secondary | ICD-10-CM | POA: Diagnosis present

## 2023-09-12 DIAGNOSIS — E1140 Type 2 diabetes mellitus with diabetic neuropathy, unspecified: Secondary | ICD-10-CM | POA: Diagnosis present

## 2023-09-12 DIAGNOSIS — J9611 Chronic respiratory failure with hypoxia: Secondary | ICD-10-CM | POA: Diagnosis present

## 2023-09-12 DIAGNOSIS — N319 Neuromuscular dysfunction of bladder, unspecified: Secondary | ICD-10-CM | POA: Diagnosis present

## 2023-09-12 DIAGNOSIS — F32A Depression, unspecified: Secondary | ICD-10-CM | POA: Diagnosis present

## 2023-09-12 DIAGNOSIS — Z7901 Long term (current) use of anticoagulants: Secondary | ICD-10-CM

## 2023-09-12 DIAGNOSIS — R55 Syncope and collapse: Secondary | ICD-10-CM | POA: Diagnosis present

## 2023-09-12 DIAGNOSIS — I5033 Acute on chronic diastolic (congestive) heart failure: Secondary | ICD-10-CM | POA: Diagnosis present

## 2023-09-12 DIAGNOSIS — Z96643 Presence of artificial hip joint, bilateral: Secondary | ICD-10-CM | POA: Diagnosis present

## 2023-09-12 DIAGNOSIS — E039 Hypothyroidism, unspecified: Secondary | ICD-10-CM | POA: Diagnosis present

## 2023-09-12 DIAGNOSIS — Z7951 Long term (current) use of inhaled steroids: Secondary | ICD-10-CM

## 2023-09-12 DIAGNOSIS — Z9359 Other cystostomy status: Secondary | ICD-10-CM | POA: Diagnosis not present

## 2023-09-12 DIAGNOSIS — Z66 Do not resuscitate: Secondary | ICD-10-CM | POA: Diagnosis present

## 2023-09-12 DIAGNOSIS — J440 Chronic obstructive pulmonary disease with acute lower respiratory infection: Secondary | ICD-10-CM | POA: Diagnosis present

## 2023-09-12 DIAGNOSIS — E876 Hypokalemia: Secondary | ICD-10-CM | POA: Diagnosis not present

## 2023-09-12 DIAGNOSIS — J159 Unspecified bacterial pneumonia: Principal | ICD-10-CM | POA: Diagnosis present

## 2023-09-12 DIAGNOSIS — E785 Hyperlipidemia, unspecified: Secondary | ICD-10-CM | POA: Diagnosis present

## 2023-09-12 DIAGNOSIS — E66811 Obesity, class 1: Secondary | ICD-10-CM | POA: Diagnosis present

## 2023-09-12 DIAGNOSIS — I110 Hypertensive heart disease with heart failure: Secondary | ICD-10-CM | POA: Diagnosis present

## 2023-09-12 DIAGNOSIS — N179 Acute kidney failure, unspecified: Secondary | ICD-10-CM | POA: Diagnosis not present

## 2023-09-12 DIAGNOSIS — F419 Anxiety disorder, unspecified: Secondary | ICD-10-CM | POA: Diagnosis present

## 2023-09-12 DIAGNOSIS — Z79890 Hormone replacement therapy (postmenopausal): Secondary | ICD-10-CM

## 2023-09-12 DIAGNOSIS — L259 Unspecified contact dermatitis, unspecified cause: Secondary | ICD-10-CM | POA: Diagnosis present

## 2023-09-12 DIAGNOSIS — I959 Hypotension, unspecified: Secondary | ICD-10-CM | POA: Diagnosis present

## 2023-09-12 DIAGNOSIS — G9341 Metabolic encephalopathy: Secondary | ICD-10-CM | POA: Diagnosis present

## 2023-09-12 DIAGNOSIS — Z79899 Other long term (current) drug therapy: Secondary | ICD-10-CM

## 2023-09-12 DIAGNOSIS — Z993 Dependence on wheelchair: Secondary | ICD-10-CM

## 2023-09-12 DIAGNOSIS — Z794 Long term (current) use of insulin: Secondary | ICD-10-CM

## 2023-09-12 LAB — BLOOD GAS, VENOUS
Acid-Base Excess: 6.1 mmol/L — ABNORMAL HIGH (ref 0.0–2.0)
Bicarbonate: 31.8 mmol/L — ABNORMAL HIGH (ref 20.0–28.0)
O2 Saturation: 57.3 %
Patient temperature: 37
pCO2, Ven: 49 mmHg (ref 44–60)
pH, Ven: 7.42 (ref 7.25–7.43)
pO2, Ven: 34 mmHg (ref 32–45)

## 2023-09-12 LAB — CBC WITH DIFFERENTIAL/PLATELET
Abs Immature Granulocytes: 0.07 10*3/uL (ref 0.00–0.07)
Basophils Absolute: 0.1 10*3/uL (ref 0.0–0.1)
Basophils Relative: 1 %
Eosinophils Absolute: 0.4 10*3/uL (ref 0.0–0.5)
Eosinophils Relative: 3 %
HCT: 39.8 % (ref 39.0–52.0)
Hemoglobin: 13 g/dL (ref 13.0–17.0)
Immature Granulocytes: 1 %
Lymphocytes Relative: 10 %
Lymphs Abs: 1.4 10*3/uL (ref 0.7–4.0)
MCH: 30.7 pg (ref 26.0–34.0)
MCHC: 32.7 g/dL (ref 30.0–36.0)
MCV: 93.9 fL (ref 80.0–100.0)
Monocytes Absolute: 0.5 10*3/uL (ref 0.1–1.0)
Monocytes Relative: 4 %
Neutro Abs: 11.7 10*3/uL — ABNORMAL HIGH (ref 1.7–7.7)
Neutrophils Relative %: 81 %
Platelets: 268 10*3/uL (ref 150–400)
RBC: 4.24 MIL/uL (ref 4.22–5.81)
RDW: 15.8 % — ABNORMAL HIGH (ref 11.5–15.5)
WBC: 14.1 10*3/uL — ABNORMAL HIGH (ref 4.0–10.5)
nRBC: 0 % (ref 0.0–0.2)

## 2023-09-12 LAB — COMPREHENSIVE METABOLIC PANEL WITH GFR
ALT: 6 U/L (ref 0–44)
AST: 20 U/L (ref 15–41)
Albumin: 3.5 g/dL (ref 3.5–5.0)
Alkaline Phosphatase: 113 U/L (ref 38–126)
Anion gap: 11 (ref 5–15)
BUN: 36 mg/dL — ABNORMAL HIGH (ref 8–23)
CO2: 26 mmol/L (ref 22–32)
Calcium: 9.6 mg/dL (ref 8.9–10.3)
Chloride: 97 mmol/L — ABNORMAL LOW (ref 98–111)
Creatinine, Ser: 1.24 mg/dL (ref 0.61–1.24)
GFR, Estimated: 59 mL/min — ABNORMAL LOW (ref >60)
Glucose, Bld: 162 mg/dL — ABNORMAL HIGH (ref 70–99)
Potassium: 4 mmol/L (ref 3.5–5.1)
Sodium: 134 mmol/L — ABNORMAL LOW (ref 135–145)
Total Bilirubin: 1.1 mg/dL (ref 0.0–1.2)
Total Protein: 7.8 g/dL (ref 6.5–8.1)

## 2023-09-12 LAB — LACTIC ACID, PLASMA
Lactic Acid, Venous: 1.8 mmol/L (ref 0.5–1.9)
Lactic Acid, Venous: 5.9 mmol/L (ref 0.5–1.9)
Lactic Acid, Venous: 4.9 mmol/L (ref 0.5–1.9)

## 2023-09-12 LAB — TROPONIN I (HIGH SENSITIVITY)
Troponin I (High Sensitivity): 11 ng/L (ref <18)
Troponin I (High Sensitivity): 8 ng/L (ref <18)

## 2023-09-12 LAB — BRAIN NATRIURETIC PEPTIDE: B Natriuretic Peptide: 150.3 pg/mL — ABNORMAL HIGH (ref 0.0–100.0)

## 2023-09-12 MED ORDER — HYDROCORTISONE ACETATE 25 MG RE SUPP: 25.0000 mg | RECTAL | Status: DC | PRN

## 2023-09-12 MED ORDER — MIDODRINE HCL 5 MG PO TABS
2.5000 mg | ORAL_TABLET | Freq: Three times a day (TID) | ORAL | Status: DC
  Administered 2023-09-13 – 2023-09-17 (×14): 2.5 mg via ORAL
  Filled 2023-09-12 (×14): qty 1

## 2023-09-12 MED ORDER — LEVOTHYROXINE SODIUM 50 MCG PO TABS
75.0000 ug | ORAL_TABLET | Freq: Every day | ORAL | Status: DC
  Administered 2023-09-13 – 2023-09-17 (×5): 75 ug via ORAL
  Filled 2023-09-12 (×5): qty 2

## 2023-09-12 MED ORDER — CARBIDOPA-LEVODOPA 25-100 MG PO TABS
1.0000 | ORAL_TABLET | Freq: Three times a day (TID) | ORAL | Status: DC
  Administered 2023-09-13 – 2023-09-17 (×14): 1 via ORAL
  Filled 2023-09-12 (×14): qty 1

## 2023-09-12 MED ORDER — BUDESON-GLYCOPYRROL-FORMOTEROL 160-9-4.8 MCG/ACT IN AERO
2.0000 | INHALATION_SPRAY | Freq: Two times a day (BID) | RESPIRATORY_TRACT | Status: DC
  Administered 2023-09-12 – 2023-09-14 (×4): 2 via RESPIRATORY_TRACT
  Filled 2023-09-12: qty 5.9

## 2023-09-12 MED ORDER — POLYETHYLENE GLYCOL 3350 17 G PO PACK
17.0000 g | PACK | Freq: Every day | ORAL | Status: DC
  Administered 2023-09-13 – 2023-09-17 (×2): 17 g via ORAL
  Filled 2023-09-12 (×5): qty 1

## 2023-09-12 MED ORDER — BISACODYL 10 MG RE SUPP: 10.0000 mg | RECTAL | Status: DC

## 2023-09-12 MED ORDER — DOCUSATE SODIUM 100 MG PO CAPS
100.0000 mg | ORAL_CAPSULE | Freq: Every day | ORAL | Status: DC
  Administered 2023-09-13 – 2023-09-14 (×2): 100 mg via ORAL
  Filled 2023-09-12 (×4): qty 1

## 2023-09-12 MED ORDER — PANTOPRAZOLE SODIUM 40 MG PO TBEC
40.0000 mg | DELAYED_RELEASE_TABLET | Freq: Every day | ORAL | Status: DC
  Administered 2023-09-13: 40 mg via ORAL
  Filled 2023-09-12: qty 1

## 2023-09-12 MED ORDER — TIZANIDINE HCL 2 MG PO TABS: 2.0000 mg | ORAL_TABLET | Freq: Two times a day (BID) | ORAL | Status: DC | PRN

## 2023-09-12 MED ORDER — GABAPENTIN 400 MG PO CAPS
800.0000 mg | ORAL_CAPSULE | Freq: Four times a day (QID) | ORAL | Status: DC
  Administered 2023-09-12 – 2023-09-17 (×19): 800 mg via ORAL
  Filled 2023-09-12 (×19): qty 2

## 2023-09-12 MED ORDER — MIRABEGRON ER 25 MG PO TB24: 25.0000 mg | ORAL_TABLET | Freq: Every day | ORAL | Status: DC

## 2023-09-12 MED ORDER — DM-GUAIFENESIN ER 30-600 MG PO TB12
1.0000 | ORAL_TABLET | Freq: Two times a day (BID) | ORAL | Status: DC | PRN
  Administered 2023-09-13 – 2023-09-14 (×2): 1 via ORAL
  Filled 2023-09-12 (×2): qty 1

## 2023-09-12 MED ORDER — APIXABAN 5 MG PO TABS
5.0000 mg | ORAL_TABLET | Freq: Two times a day (BID) | ORAL | Status: DC
  Administered 2023-09-12 – 2023-09-17 (×10): 5 mg via ORAL
  Filled 2023-09-12 (×11): qty 1

## 2023-09-12 MED ORDER — IPRATROPIUM-ALBUTEROL 0.5-2.5 (3) MG/3ML IN SOLN
3.0000 mL | Freq: Four times a day (QID) | RESPIRATORY_TRACT | Status: DC
  Administered 2023-09-12: 3 mL via RESPIRATORY_TRACT
  Filled 2023-09-12 (×2): qty 3

## 2023-09-12 MED ORDER — SODIUM CHLORIDE 0.9 % IV SOLN: 1.0000 g | INTRAVENOUS | Status: DC

## 2023-09-12 MED ORDER — ATORVASTATIN CALCIUM 20 MG PO TABS
20.0000 mg | ORAL_TABLET | Freq: Every day | ORAL | Status: DC
  Administered 2023-09-12 – 2023-09-16 (×5): 20 mg via ORAL
  Filled 2023-09-12 (×5): qty 1

## 2023-09-12 MED ORDER — HYDROXYZINE HCL 25 MG PO TABS: 25.0000 mg | ORAL_TABLET | Freq: Four times a day (QID) | ORAL | Status: DC | PRN

## 2023-09-12 MED ORDER — PROMETHAZINE HCL 25 MG PO TABS: 12.5000 mg | ORAL_TABLET | Freq: Four times a day (QID) | ORAL | Status: DC | PRN

## 2023-09-12 MED ORDER — FUROSEMIDE 10 MG/ML IJ SOLN
20.0000 mg | Freq: Two times a day (BID) | INTRAMUSCULAR | Status: DC
  Administered 2023-09-13 – 2023-09-15 (×5): 20 mg via INTRAVENOUS
  Filled 2023-09-12 (×6): qty 4

## 2023-09-12 MED ORDER — SODIUM CHLORIDE 0.9 % IV SOLN
2.0000 g | Freq: Once | INTRAVENOUS | Status: AC
  Administered 2023-09-12: 2 g via INTRAVENOUS
  Filled 2023-09-12: qty 20

## 2023-09-12 MED ORDER — SODIUM CHLORIDE 0.9 % IV SOLN
2.0000 g | INTRAVENOUS | Status: DC
  Administered 2023-09-13 – 2023-09-17 (×5): 2 g via INTRAVENOUS
  Filled 2023-09-12 (×5): qty 20

## 2023-09-12 MED ORDER — INSULIN ASPART 100 UNIT/ML IJ SOLN
0.0000 [IU] | Freq: Three times a day (TID) | INTRAMUSCULAR | Status: DC
  Administered 2023-09-13: 8 [IU] via SUBCUTANEOUS
  Administered 2023-09-13 – 2023-09-14 (×3): 3 [IU] via SUBCUTANEOUS
  Administered 2023-09-14: 2 [IU] via SUBCUTANEOUS
  Administered 2023-09-14: 3 [IU] via SUBCUTANEOUS
  Administered 2023-09-15 (×2): 2 [IU] via SUBCUTANEOUS
  Administered 2023-09-15: 5 [IU] via SUBCUTANEOUS
  Administered 2023-09-16: 3 [IU] via SUBCUTANEOUS
  Administered 2023-09-16: 11 [IU] via SUBCUTANEOUS
  Administered 2023-09-17 (×2): 2 [IU] via SUBCUTANEOUS
  Filled 2023-09-12 (×12): qty 1

## 2023-09-12 MED ORDER — ACETAMINOPHEN 325 MG PO TABS
650.0000 mg | ORAL_TABLET | Freq: Four times a day (QID) | ORAL | Status: AC | PRN
Start: 2023-09-12 — End: ?

## 2023-09-12 MED ORDER — RISAQUAD PO CAPS
1.0000 | ORAL_CAPSULE | Freq: Every day | ORAL | Status: DC
  Administered 2023-09-13 – 2023-09-17 (×5): 1 via ORAL
  Filled 2023-09-12 (×5): qty 1

## 2023-09-12 MED ORDER — IPRATROPIUM-ALBUTEROL 0.5-2.5 (3) MG/3ML IN SOLN
3.0000 mL | Freq: Once | RESPIRATORY_TRACT | Status: AC
  Administered 2023-09-12: 3 mL via RESPIRATORY_TRACT
  Filled 2023-09-12: qty 3

## 2023-09-12 MED ORDER — METHOCARBAMOL 500 MG PO TABS
1000.0000 mg | ORAL_TABLET | Freq: Two times a day (BID) | ORAL | Status: DC
  Administered 2023-09-12 – 2023-09-17 (×10): 1000 mg via ORAL
  Filled 2023-09-12 (×10): qty 2

## 2023-09-12 MED ORDER — IPRATROPIUM BROMIDE 0.06 % NA SOLN
2.0000 | Freq: Three times a day (TID) | NASAL | Status: DC
  Administered 2023-09-12 – 2023-09-17 (×14): 2 via NASAL
  Filled 2023-09-12: qty 15

## 2023-09-12 MED ORDER — DILTIAZEM HCL ER COATED BEADS 240 MG PO CP24: 240.0000 mg | ORAL_CAPSULE | Freq: Every day | ORAL | Status: DC

## 2023-09-12 MED ORDER — ALLOPURINOL 100 MG PO TABS
200.0000 mg | ORAL_TABLET | Freq: Every day | ORAL | Status: DC
  Administered 2023-09-13 – 2023-09-17 (×5): 200 mg via ORAL
  Filled 2023-09-12 (×5): qty 2

## 2023-09-12 MED ORDER — ACETAMINOPHEN 650 MG RE SUPP: 650.0000 mg | Freq: Four times a day (QID) | RECTAL | Status: DC | PRN

## 2023-09-12 MED ORDER — ALBUTEROL SULFATE (2.5 MG/3ML) 0.083% IN NEBU
3.0000 mL | INHALATION_SOLUTION | RESPIRATORY_TRACT | Status: DC | PRN
  Administered 2023-09-13: 3 mL via RESPIRATORY_TRACT
  Filled 2023-09-12: qty 3

## 2023-09-12 MED ORDER — METOCLOPRAMIDE HCL 5 MG/ML IJ SOLN: 5.0000 mg | Freq: Three times a day (TID) | INTRAMUSCULAR | Status: DC | PRN

## 2023-09-12 MED ORDER — METHYLPREDNISOLONE SODIUM SUCC 125 MG IJ SOLR
125.0000 mg | Freq: Once | INTRAMUSCULAR | Status: AC
  Administered 2023-09-12: 125 mg via INTRAVENOUS
  Filled 2023-09-12: qty 2

## 2023-09-12 MED ORDER — SODIUM CHLORIDE 0.9 % IV SOLN
100.0000 mg | Freq: Two times a day (BID) | INTRAVENOUS | Status: AC
  Administered 2023-09-13 – 2023-09-14 (×3): 100 mg via INTRAVENOUS
  Filled 2023-09-12 (×3): qty 100

## 2023-09-12 MED ORDER — AZITHROMYCIN 500 MG IV SOLR
500.0000 mg | Freq: Once | INTRAVENOUS | Status: DC
  Filled 2023-09-12: qty 5

## 2023-09-12 MED ORDER — LACTASE 3000 UNITS PO TABS
3000.0000 [IU] | ORAL_TABLET | Freq: Three times a day (TID) | ORAL | Status: DC
  Administered 2023-09-13 – 2023-09-17 (×14): 3000 [IU] via ORAL
  Filled 2023-09-12 (×15): qty 1

## 2023-09-12 MED ORDER — OXYBUTYNIN CHLORIDE 5 MG PO TABS
5.0000 mg | ORAL_TABLET | Freq: Two times a day (BID) | ORAL | Status: DC
  Administered 2023-09-12 – 2023-09-16 (×9): 5 mg via ORAL
  Filled 2023-09-12 (×9): qty 1

## 2023-09-12 MED ORDER — FLUTICASONE PROPIONATE 50 MCG/ACT NA SUSP
2.0000 | Freq: Every day | NASAL | Status: DC
  Administered 2023-09-13 – 2023-09-17 (×4): 2 via NASAL
  Filled 2023-09-12: qty 16

## 2023-09-12 MED ORDER — PREDNISONE 20 MG PO TABS
40.0000 mg | ORAL_TABLET | Freq: Every day | ORAL | Status: AC
  Administered 2023-09-13 – 2023-09-16 (×4): 40 mg via ORAL
  Filled 2023-09-12 (×4): qty 2

## 2023-09-12 MED ORDER — METOPROLOL TARTRATE 25 MG PO TABS
12.5000 mg | ORAL_TABLET | Freq: Two times a day (BID) | ORAL | Status: DC
  Administered 2023-09-12 – 2023-09-16 (×8): 12.5 mg via ORAL
  Filled 2023-09-12 (×10): qty 1

## 2023-09-12 MED ORDER — LACTULOSE 10 GM/15ML PO SOLN
30.0000 g | Freq: Every day | ORAL | Status: DC
  Administered 2023-09-13 – 2023-09-17 (×3): 30 g via ORAL
  Filled 2023-09-12 (×5): qty 60

## 2023-09-12 MED ORDER — SENNA 8.6 MG PO TABS: 1.0000 | ORAL_TABLET | Freq: Every day | ORAL | Status: DC | PRN

## 2023-09-12 MED ORDER — METOPROLOL TARTRATE 5 MG/5ML IV SOLN: 2.5000 mg | INTRAVENOUS | Status: DC | PRN

## 2023-09-12 NOTE — ED Notes (Signed)
 Pt is a high fall risk, pt currently has personal shoes on, fall risk bracelet and bed alarm is on.

## 2023-09-12 NOTE — ED Provider Notes (Signed)
 [facility] Provider Note    Event Date/Time   First MD Initiated Contact with Patient 09/12/23 1337     (approximate)   History   No chief complaint on file.   HPI  Raymond Hays is a 78 y.o. male  COPD, chronic hypoxic respiratory failure on 2 L baseline, hypertension, diastolic congestive heart failure, chronic suprapubic Foley catheter, neurogenic bladder, atrial fibrillation on Eliquis , history of ESBL UTI, spinal stenosis, wheelchair-bound, Parkinson disease, hypertension who comes in for episode of unresponsiveness.  Patient reports that at baseline he is unable to ambulate secondary to prior spinal surgery.  He reports that he has felt a little bit more short of breath but he denies any significant chest pain.  His abdomen appears distended but he did really denies any discomfort of it.  He comes with DNR paperwork but he does want workup for this unresponsive episode.  He reports being compliant with his medications.  Denies any known fevers.   Physical Exam   Triage Vital Signs: ED Triage Vitals [09/12/23 1329]  Encounter Vitals Group     BP 100/78     Girls Systolic BP Percentile      Girls Diastolic BP Percentile      Boys Systolic BP Percentile      Boys Diastolic BP Percentile      Pulse Rate 86     Resp (!) 22     Temp (!) 97.5 F (36.4 C)     Temp Source Oral     SpO2 97 %     Weight 198 lb (89.8 kg)     Height 5' 7 (1.702 m)     Head Circumference      Peak Flow      Pain Score 0     Pain Loc      Pain Education      Exclude from Growth Chart     Most recent vital signs: Vitals:   09/12/23 1329  BP: 100/78  Pulse: 86  Resp: (!) 22  Temp: (!) 97.5 F (36.4 C)  SpO2: 97%     General: Awake, no distress.  CV:  Good peripheral perfusion.  Resp:   Some wheezing noted mild increased work of breathing Abd:  distention but reports being nontender Other:  Atrophy noted of his left leg baseline.  A little bit of swelling noted  in the right leg   ED Results / Procedures / Treatments   Labs (all labs ordered are listed, but only abnormal results are displayed) Labs Reviewed  CBC WITH DIFFERENTIAL/PLATELET - Abnormal; Notable for the following components:      Result Value   WBC 14.1 (*)    RDW 15.8 (*)    Neutro Abs 11.7 (*)    All other components within normal limits  COMPREHENSIVE METABOLIC PANEL WITH GFR - Abnormal; Notable for the following components:   Sodium 134 (*)    Chloride 97 (*)    Glucose, Bld 162 (*)    BUN 36 (*)    GFR, Estimated 59 (*)    All other components within normal limits  BRAIN NATRIURETIC PEPTIDE - Abnormal; Notable for the following components:   B Natriuretic Peptide 150.3 (*)    All other components within normal limits  BLOOD GAS, VENOUS - Abnormal; Notable for the following components:   Bicarbonate 31.8 (*)    Acid-Base Excess 6.1 (*)    All other components within normal limits  TROPONIN I (HIGH  SENSITIVITY)     EKG  My interpretation of EKG:  Atrial fibrillation with a rate of 87 without any ST elevation or T wave inversions except for maybe lead III, normal intervals  RADIOLOGY I have reviewed the xray personally and interpreted no obvious pneumonia   PROCEDURES:  Critical Care performed: No  .1-3 Lead EKG Interpretation  Performed by: Lubertha Rush, MD Authorized by: Lubertha Rush, MD     Interpretation: normal     ECG rate:  60   ECG rate assessment: normal     Ectopy: none     Conduction: normal      MEDICATIONS ORDERED IN ED: Medications - No data to display   IMPRESSION / MDM / ASSESSMENT AND PLAN / ED COURSE  I reviewed the triage vital signs and the nursing notes.   Patient's presentation is most consistent with acute presentation with potential threat to life or bodily function.   Patient comes in with unresponsive episode will get labs to evaluate for Electra abnormalities, AKI, UTI, ACS will keep on the cardiac monitor patient  is on blood thinner doubt PE.  Does sound somewhat wheezy with a little bit increased work of breathing will trial some DuoNebs, steroids.  Will get CT imaging evaluate for intercranial hemorrhage although no report of any falls or hitting his head and get CT abdomen to ensure no acute abdominal pathology just given he is very distended although he denies any tenderness.  Reassuring without any evidence of hypercapnia.  CBC shows elevated white count.  Will get urine evaluate for UTI.  CMP reassuring.  BNP reassuring  Patient added off to oncoming team pending the CT imaging and final disposition   The patient is on the cardiac monitor to evaluate for evidence of arrhythmia and/or significant heart rate changes.      FINAL CLINICAL IMPRESSION(S) / ED DIAGNOSES   Final diagnoses:  Unresponsive episode     Rx / DC Orders   ED Discharge Orders     None        Note:  This document was prepared using Dragon voice recognition software and may include unintentional dictation errors.   Lubertha Rush, MD 09/12/23 [phone]

## 2023-09-12 NOTE — H&P (Addendum)
 History and Physical    Raymond Hays ZOX:[id number] DOB: [birth year] DOA: 09/12/2023  PCP: Rogenia Clause, MD (Confirm with patient/family/NH records and if not entered, this has to be entered at [facility] point of entry) Patient coming from: SNF  I have personally briefly reviewed patient's old medical records in [facility]  Chief Complaint: Cough, wheezing, shortness of breath, passing out  HPI: Raymond Hays is a 78 y.o. male with medical history significant of COPD, chronic hypoxic respiratory failure on 2 L as needed, HTN, chronic HFpEF, severe mitral regurgitation status post MitraClip, chronic suprapubic Foley catheter, neurogenic bladder, PAF on Eliquis , wheelchair-bound, Parkinson's disease, HTN, IDDM, diabetic neuropathy, presented with multiple complaints including worsening of productive cough wheezing and shortness of breath and syncope episode.  Patient started to have a productive cough 1 week ago, with intermittent yellowish sputum production, started to have wheezing last 2 to 3 days despite using more including medications.  Increasingly feeling shortness of breath at rest.  This morning, while at breakfast table, patient  blanked out, slumped over, no fall.  SNF staff noticed patient became unresponsive responded to sternal rubs.  EMS arrived and found patient obtunded vital signs blood pressure 103/67 heart rate 65 glucose 181 O2 saturation 95% on room air.  ED Course: Afebrile, blood pressure 100/76 O2 saturation 93% on room air.  Chest x-ray and CT abdominal pelvis showed right lower lobe consolidation and infiltrates indicating for pneumonia.  Large stool burden.  ED physician able to manually extract stools and abdominal distention significant improved afterwards.  Blood work showed WBC 14 hemoglobin 13 BUN 36 creatinine 1.2 glucose 167.  VBG 7.42/49/34.  Patient was given ceftriaxone  and azithromycin  in the ED.  Review of Systems: As per HPI otherwise 14 point review of  systems negative.    Past Medical History:  Diagnosis Date   Allergic rhinitis    Anxiety    Arthritis    Atrial fibrillation (HCC)    Chronic indwelling Foley catheter    COPD (chronic obstructive pulmonary disease) (HCC)    Cough    Diabetes (HCC)    Essential (primary) hypertension    Hemorrhoid    Hyperlipidemia    PAF (paroxysmal atrial fibrillation) (HCC)    Primary osteoarthritis, unspecified shoulder    Retention of urine, unspecified    Spinal stenosis    UTI (urinary tract infection)     Past Surgical History:  Procedure Laterality Date   APPENDECTOMY     BACK SURGERY     COLONOSCOPY N/A 10/27/2020   Procedure: COLONOSCOPY;  Surgeon: Luke Salaam, MD;  Location: [facility] ENDOSCOPY;  Service: Gastroenterology;  Laterality: N/A;   ELBOW SURGERY     ESOPHAGOGASTRODUODENOSCOPY (EGD) WITH PROPOFOL  N/A 10/27/2020   Procedure: ESOPHAGOGASTRODUODENOSCOPY (EGD) WITH PROPOFOL ;  Surgeon: Luke Salaam, MD;  Location: [facility] ENDOSCOPY;  Service: Gastroenterology;  Laterality: N/A;   HERNIA REPAIR     KNEE SURGERY     neck     PARTIAL HIP ARTHROPLASTY Bilateral    TEE WITHOUT CARDIOVERSION N/A 10/19/2020   Procedure: TRANSESOPHAGEAL ECHOCARDIOGRAM (TEE);  Surgeon: Gollan, Timothy J, MD;  Location: [facility] ORS;  Service: Cardiovascular;  Laterality: N/A;   TONSILLECTOMY       reports that he quit smoking about 19 years ago. His smoking use included cigarettes. He started smoking about 29 years ago. He has a 5 pack-year smoking history. He has never used smokeless tobacco. He reports that he does not currently use alcohol . He reports that he does  not currently use drugs after having used the following drugs: Crack cocaine.  No Known Allergies  Family History  Problem Relation Age of Onset   Healthy Son    Healthy Son      Prior to Admission medications   Medication Sig Start Date End Date Taking? Authorizing Provider  acetaminophen  (TYLENOL ) 325 MG tablet Take 2 tablets (650 mg  total) by mouth every 6 (six) hours as needed for mild pain (pain score 1-3), fever or moderate pain (pain score 4-6). 05/09/23   Alexander, Natalie, DO  Albuterol  Sulfate (PROAIR  RESPICLICK) 108 (90 Base) MCG/ACT AEPB Inhale 2 puffs into the lungs every 4 (four) hours as needed (COPD).    [provider]  allopurinol  (ZYLOPRIM ) 100 MG tablet Take 200 mg by mouth daily.    [provider]  apixaban  (ELIQUIS ) 5 MG TABS tablet Take 5 mg by mouth 2 (two) times daily.     [provider]  ascorbic acid  (VITAMIN C ) 500 MG tablet Take 500 mg by mouth daily.    [provider]  atorvastatin  (LIPITOR) 20 MG tablet Take 20 mg by mouth at bedtime.    [provider]  bisacodyl  (DULCOLAX) 10 MG suppository Place 10 mg rectally every 3 (three) days. (HOLD if having loose stools)    [provider]  calcium  carbonate (TUMS - DOSED IN MG ELEMENTAL CALCIUM ) 500 MG chewable tablet Chew 2 tablets by mouth daily as needed for indigestion or heartburn. 01/15/23   [provider]  carbidopa -levodopa  (SINEMET  IR) 25-100 MG tablet Take 1 tablet by mouth 3 (three) times daily before meals.    [provider]  Cholecalciferol  (VITAMIN D3) 50 MCG (2000 UT) TABS Take 1 tablet by mouth daily. 04/22/23   [provider]  dextromethorphan -guaiFENesin  (MUCINEX  DM) 30-600 MG 12hr tablet Take 1 tablet by mouth 2 (two) times daily as needed for cough. 05/09/23   Alexander, Natalie, DO  diltiazem  (CARDIZEM  CD) 240 MG 24 hr capsule Take 1 capsule (240 mg total) by mouth daily. 03/17/23   Shalhoub, Merrill Abide, MD  docusate sodium  (COLACE) 100 MG capsule Take 100 mg by mouth daily.    [provider]  Emollient (CETAPHIL) cream Apply 1 Application topically 2 (two) times daily as needed.    [provider]  Eyelid Cleansers (OCUSOFT LID SCRUB ORIGINAL) PADS Place 1 Application into both eyes daily. Wipe both eyelids once per day.    [provider]  feeding supplement (ENSURE ENLIVE / ENSURE PLUS) LIQD Take 237 mLs by mouth 2 (two) times daily between meals. 05/10/23   Alexander, Natalie, DO  ferrous sulfate  324 (65 Fe) MG TBEC Take 1 tablet by mouth daily. 12/05/22   [provider]  fluticasone  (FLONASE ) 50 MCG/ACT nasal spray Place 2 sprays into both nostrils daily.    [provider]  Fluticasone -Umeclidin-Vilant 100-62.5-25 MCG/ACT AEPB Inhale 1 puff into the lungs daily.    [provider]  furosemide  (LASIX ) 20 MG tablet Take 2 tablets (40 mg total) by mouth daily. 05/09/23   Alexander, Natalie, DO  gabapentin  (NEURONTIN ) 800 MG tablet Take 800 mg by mouth 4 (four) times daily.    [provider]  hydrocortisone  (ANUSOL -HC) 25 MG suppository Place 25 mg rectally every 4 (four) hours as needed for hemorrhoids or anal itching.    [provider]  hydrOXYzine  (ATARAX /VISTARIL ) 25 MG tablet Take 25 mg by mouth every 6 (six) hours as needed for itching. 02/17/20   [provider]  insulin  glargine-yfgn (SEMGLEE ) 100 UNIT/ML injection Inject 0.1 mLs (10 Units total) into the skin daily. 05/10/23   Alexander, Natalie, DO  ipratropium (ATROVENT ) 0.06 % nasal spray Place 2 sprays into both nostrils 3 (three) times daily. 11/04/22   [provider]  ipratropium-albuterol  (DUONEB) 0.5-2.5 (3) MG/3ML SOLN Take 3 mLs by nebulization every 6 (six) hours.    [provider]  lactase (LACTAID) 3000 units tablet Take 3,000 Units by mouth 3 (three) times daily with meals.    [provider]  lactulose  (CHRONULAC ) 10 GM/15ML solution Take 45 mLs (30 g total) by mouth daily. 05/10/23   Alexander, Natalie, DO  levothyroxine  (SYNTHROID ) 75 MCG tablet Take 75 mcg by mouth daily before breakfast.    [provider]  lidocaine  4 % Place 1 patch onto the skin daily as needed (pain). (Remove after 12 hours)    [provider]  liver oil-zinc  oxide (DESITIN) 40 %  ointment Apply topically 2 (two) times daily. On skin around suprapubic cathether 06/13/23   Garrison Kanner, MD  Menthol , Topical Analgesic, (BIOFREEZE) 4 % GEL Apply 1 application  topically 3 (three) times daily. (Apply to neck)    [provider]  methocarbamol  (ROBAXIN ) 500 MG tablet Take 1,000 mg by mouth every 12 (twelve) hours. At 1400    [provider]  metoprolol  tartrate (LOPRESSOR ) 25 MG tablet Take 0.5 tablets (12.5 mg total) by mouth 2 (two) times daily. Reduced from 50 mg. 06/13/23   Garrison Kanner, MD  midodrine  (PROAMATINE ) 2.5 MG tablet Take 1 tablet (2.5 mg total) by mouth 3 (three) times daily with meals. 06/13/23   Garrison Kanner, MD  mirabegron  ER (MYRBETRIQ ) 25 MG TB24 tablet Take 1 tablet (25 mg total) by mouth daily. 06/13/23   Garrison Kanner, MD  oxybutynin  (DITROPAN ) 5 MG tablet Take 5 mg by mouth 2 (two) times daily.    [provider]  pantoprazole  (PROTONIX ) 40 MG tablet Take 1 tablet (40 mg total) by mouth daily. 05/10/23   Alexander, Natalie, DO  polyethylene glycol (MIRALAX  / GLYCOLAX ) 17 g packet Take 17 g by mouth daily.    [provider]  pramoxine (SARNA SENSITIVE) 1 % LOTN Apply 1 Application topically 4 (four) times daily as needed.    [provider]  Probiotic Product (ACIDOPHILUS PROBIOTIC BLEND) CAPS Take 1 capsule by mouth daily. 12/05/22   [provider]  Propylene Glycol 0.6 % SOLN Place 2 drops into both eyes in the morning, at noon, and at bedtime.    [provider]  psyllium (METAMUCIL SMOOTH TEXTURE) 58.6 % powder Take 1 packet by mouth 3 (three) times daily. 09/29/22   Twilla Galea, MD  senna (SENOKOT) 8.6 MG TABS tablet Take 1 tablet (8.6 mg total) by mouth daily as needed for mild constipation. 09/29/22   Twilla Galea, MD  sodium chloride  (OCEAN) 0.65 % SOLN nasal spray Place 2 sprays into both nostrils every 6 (six) hours as needed for congestion.    [provider]  sodium phosphate (FLEET) ENEM  Place 133 mLs (1 enema total) rectally daily as needed for severe constipation. 05/09/23   Alexander, Natalie, DO  tiZANidine  (ZANAFLEX ) 2 MG tablet Take 2 mg by mouth 2 (two) times daily as needed for muscle spasms.    [provider]  triamcinolone  cream (KENALOG ) 0.1 % Apply 1 Application topically daily as needed. Apply to arms, legs, and back r/t rash    [provider]  Physical Exam: Vitals:   09/12/23 1430 09/12/23 1500 09/12/23 1600 09/12/23 1615  BP: 109/77 102/75 103/76   Pulse: 80 72 78   Resp: 20 20 15    Temp:      TempSrc:      SpO2: 97% 100% 90% 93%  Weight:      Height:        Constitutional: NAD, calm, comfortable Vitals:   09/12/23 1430 09/12/23 1500 09/12/23 1600 09/12/23 1615  BP: 109/77 102/75 103/76   Pulse: 80 72 78   Resp: 20 20 15    Temp:      TempSrc:      SpO2: 97% 100% 90% 93%  Weight:      Height:       Eyes: PERRL, lids and conjunctivae normal ENMT: Mucous membranes are moist. Posterior pharynx clear of any exudate or lesions.Normal dentition.  Neck: normal, supple, no masses, no thyromegaly Respiratory: Diminished breathing sound bilaterally, diffused wheezing, increasing coarse crackle on right side, increasing respiratory effort. No accessory muscle use.  Cardiovascular: Regular rate and rhythm, no murmurs / rubs / gallops.  2+ extremity edema. 2+ pedal pulses. No carotid bruits.  Abdomen: no tenderness, no masses palpated. No hepatosplenomegaly. Bowel sounds positive.  Musculoskeletal: no clubbing / cyanosis. No joint deformity upper and lower extremities. Good ROM, no contractures. Normal muscle tone.  Skin: no rashes, lesions, ulcers. No induration Neurologic: CN 2-12 grossly intact. Sensation intact, DTR normal. Strength 5/5 in all 4.  Psychiatric: Normal judgment and insight. Alert and oriented x 3. Normal mood.     Labs on Admission: I have personally reviewed following labs and imaging studies  CBC: Recent Labs   Lab 09/12/23 1345  WBC 14.1*  NEUTROABS 11.7*  HGB 13.0  HCT 39.8  MCV 93.9  PLT 268   Basic Metabolic Panel: Recent Labs  Lab 09/12/23 1345  NA 134*  K 4.0  CL 97*  CO2 26  GLUCOSE 162*  BUN 36*  CREATININE 1.24  CALCIUM  9.6   GFR: Estimated Creatinine Clearance: 51.7 mL/min (by C-G formula based on SCr of 1.24 mg/dL). Liver Function Tests: Recent Labs  Lab 09/12/23 1345  AST 20  ALT 6  ALKPHOS 113  BILITOT 1.1  PROT 7.8  ALBUMIN 3.5   No results for input(s): LIPASE, AMYLASE in the last 168 hours. No results for input(s): AMMONIA in the last 168 hours. Coagulation Profile: No results for input(s): INR, PROTIME in the last 168 hours. Cardiac Enzymes: No results for input(s): CKTOTAL, CKMB, CKMBINDEX, TROPONINI in the last 168 hours. BNP (last 3 results) No results for input(s): PROBNP in the last 8760 hours. HbA1C: No results for input(s): HGBA1C in the last 72 hours. CBG: No results for input(s): GLUCAP in the last 168 hours. Lipid Profile: No results for input(s): CHOL, HDL, LDLCALC, TRIG, CHOLHDL, LDLDIRECT in the last 72 hours. Thyroid Function Tests: No results for input(s): TSH, T4TOTAL, FREET4, T3FREE, THYROIDAB in the last 72 hours. Anemia Panel: No results for input(s): VITAMINB12, FOLATE, FERRITIN, TIBC, IRON, RETICCTPCT in the last 72 hours. Urine analysis:    Component Value Date/Time   COLORURINE YELLOW (A) 07/03/2023 1558   APPEARANCEUR CLOUDY (A) 07/03/2023 1558   LABSPEC 1.012 07/03/2023 1558   PHURINE 7.0 07/03/2023 1558   GLUCOSEU NEGATIVE 07/03/2023 1558   HGBUR LARGE (A) 07/03/2023 1558   BILIRUBINUR NEGATIVE 07/03/2023 1558   KETONESUR NEGATIVE 07/03/2023 1558   PROTEINUR NEGATIVE 07/03/2023 1558   NITRITE NEGATIVE 07/03/2023 1558   LEUKOCYTESUR  MODERATE (A) 07/03/2023 1558    Radiological Exams on Admission: CT ABDOMEN PELVIS WO CONTRAST Addendum Date:  09/12/2023 ADDENDUM REPORT: 09/12/2023 16:31 ADDENDUM: Partial right lower lobe consolidation with bronchiectasis, likely a component of pulmonary scarring. Difficult to exclude recurrent infection or aspiration. Electronically Signed   By: Esmeralda Hedge M.D.   On: 09/12/2023 16:31   Result Date: 09/12/2023 CLINICAL DATA:  Unresponsive distended abdomen EXAM: CT ABDOMEN AND PELVIS WITHOUT CONTRAST TECHNIQUE: Multidetector CT imaging of the abdomen and pelvis was performed following the standard protocol without IV contrast. RADIATION DOSE REDUCTION: This exam was performed according to the departmental dose-optimization program which includes automated exposure control, adjustment of the mA and/or kV according to patient size and/or use of iterative reconstruction technique. COMPARISON:  Radiograph 05/01/2023, CT 04/29/2023, 09/29/2022, 04/16/2022 FINDINGS: Lower chest: Lung bases demonstrate partial right lower lobe consolidation with bronchiectasis. Coronary vascular calcification. Postsurgical changes at the mitral valve. Cardiomegaly. Hepatobiliary: Hepatic cyst. Small gallstone. No biliary dilatation Pancreas: Unremarkable. No pancreatic ductal dilatation or surrounding inflammatory changes. Spleen: Normal in size without focal abnormality. Adrenals/Urinary Tract: Stable adrenal glands. No hydronephrosis. Stable left renal cyst and subcentimeter renal lesions too small to further characterize, no specific imaging follow-up is recommended. Possible hyperdense exophytic lesion lower pole left kidney measuring 13 mm on series 2, image 52. Suprapubic Foley catheter Stomach/Bowel: Stomach nonenlarged. No dilated small bowel. Large stool burden throughout the colon with impacted appearing feces at the rectum. Marked sigmoid colon air distension, chronic finding. Vascular/Lymphatic: Aortic atherosclerosis. No enlarged abdominal or pelvic lymph nodes. Reproductive: Prostate is unremarkable. Other: Negative for pelvic  effusion or free air. Musculoskeletal: Bilateral hip replacements. Multilevel degenerative changes. Postsurgical changes of the lower lumbar spine. IMPRESSION: 1. Large stool burden throughout the colon with impacted appearing feces at the rectum suggesting constipation. Marked sigmoid colon air distension, chronic finding. 2. Cholelithiasis. 3. Possible hyperdense exophytic lesion lower pole left kidney measuring 13 mm. Recommend further evaluation with nonemergent renal ultrasound. 4. Partial right lower lobe consolidation with bronchiectasis, possible pneumonia. 5. Aortic atherosclerosis. Aortic Atherosclerosis (ICD10-I70.0). Electronically Signed: By: Esmeralda Hedge M.D. On: 09/12/2023 16:24   CT HEAD WO CONTRAST ( ) Result Date: 09/12/2023 CLINICAL DATA:  Head trauma. EXAM: CT HEAD WITHOUT CONTRAST TECHNIQUE: Contiguous axial images were obtained from the base of the skull through the vertex without intravenous contrast. RADIATION DOSE REDUCTION: This exam was performed according to the departmental dose-optimization program which includes automated exposure control, adjustment of the mA and/or kV according to patient size and/or use of iterative reconstruction technique. COMPARISON:  CT of the head dated June 06, 2023. FINDINGS: Brain: There is generalized cerebral and cerebellar volume loss present. There are focal encephalomalacia changes within the left frontal lobe related to remote cortical infarct. There is mild to moderate periventricular white matter disease. There is no evidence of acute intracranial injury. No evidence of hemorrhage, mass or hydrocephalus. Vascular: Moderate calcifications within the carotid siphons. Skull: The calvarium is intact and unremarkable. There is basilar invagination, as before. Sinuses/Orbits: The visualized paranasal sinuses and mastoid air cells are clear. The orbits are unremarkable. Other: None. IMPRESSION: 1. No evidence of acute intracranial injury. 2. 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 Electronically Signed   By: Maribeth Shivers M.D.   On: 09/12/2023 16:14   DG Chest Portable 1 View Result Date: 09/12/2023 CLINICAL DATA:  sob EXAM: PORTABLE CHEST - 1 VIEW COMPARISON:  July 03, 2023 FINDINGS: Low lung volumes.  No focal airspace consolidation, pleural effusion, or pneumothorax. Unchanged cardiomegaly. Mitral valve clips. Tortuous aorta with aortic atherosclerosis. no acute fracture or destructive lesion. Osteopenia. Multilevel thoracic osteophytosis. Diffuse gaseous distension of the transverse colon. IMPRESSION: 1. Cardiomegaly with low lung volumes. Bilateral perihilar interstitial opacities, likely bronchovascular crowding from low lung volumes. Alternatively, developing interstitial edema could have this appearance. 2. Gaseous distention of the transverse colon. Electronically Signed   By: Rance Burrows M.D.   On: 09/12/2023 15:26    EKG: Independently reviewed.  Rate controlled A-fib, no acute ST changes.  Assessment/Plan Principal Problem:   Syncope Active Problems:   PNA (pneumonia)   COPD with acute exacerbation (HCC)   Acute metabolic encephalopathy  (please populate well all problems here in Problem List. (For example, if patient is on BP meds at home and you resume or decide to hold them, it is a problem that needs to be her. Same for CAD, COPD, HLD and so on)  Syncope versus acute metabolic encephalopathy - Probably multifactorial, clinically suspect etiology is related to pneumonia and COPD exacerbation. - Cardiac arrhythmia cannot be ruled out, continue telemonitoring - Check orthostatic vital signs - Echocardiogram  RLL PNA, bacterial - Continue ceftriaxone  and change azithromycin  to doxycycline  due to EKG showing prolonged QTc - Breathing treatment - Denied any history of aspiration pneumonia.  Will consult speech evaluation.  Acute COPD exacerbation - P.o. steroid -  Antibiotics as above - Breathing treatment - Continue ICS and LABA - Incentive spirometry and flutter valve  Acute on chronic HFpEF decompensation - Significant fluid overload - Change p.o. Lasix  to IV Lasix  20 mg twice daily - Echocardiogram - Strict I&O's and daily weight  PAF - Rate controlled, plan to hold off Cardizem  due to active CHF decompensation and borderline low blood pressure - Continue metoprolol , add as needed Lopressor  - CT head negative for intracranial bleeding, continue Eliquis   IDDM -SSI for now  Diabetic neuropathy - Continue gabapentin   Hypothyroidism -Continue Synthroid   Parkinson's disease -Continue Sinemet   Anxiety/depression -Mentation at baseline.  Continue SSRI  Conditioning - PT evaluation  DVT prophylaxis: Eliquis  Code Status: DNR Family Communication: None at bedside Disposition Plan: Patient is sick with pneumonia hypoxia and COPD exacerbation as well as syncope requiring inpatient workup and IV antibiotics, expect more than 2 midnight hospital stay Consults called: None Admission status: Tele admit   [place] MD [facility] Pager [phone]  09/12/2023, 5:41 PM

## 2023-09-12 NOTE — ED Triage Notes (Signed)
 Pt [facility] via EMS from [facility] for [facility] with [facility]. They did a few sternal rubs and pt was found responsive by EMS.   EMS vitals: 181 CBG 65 HR 95% on RA 25 RR 103/67 BP

## 2023-09-12 NOTE — ED Provider Notes (Signed)
-----------------------------------------   5:09 PM on 09/12/2023 ----------------------------------------- Patient's CT has resulted showing significant constipation possible impaction.  I performed a rectal examination remove some stool although remaining stool is quite soft no concern for fecal impaction.  CT scan also shows right lower lobe consolidation possible pneumonia.  Will cover with antibiotics.  Patient satting in the 80s on room air placed on 2 L.  Suspect a degree of infection possible pneumonia causing COPD exacerbation.  Will admit to the hospital service for further workup and treatment.   Ruth Cove, MD 09/12/23 1710

## 2023-09-13 DIAGNOSIS — J441 Chronic obstructive pulmonary disease with (acute) exacerbation: Secondary | ICD-10-CM

## 2023-09-13 LAB — BASIC METABOLIC PANEL WITH GFR
Anion gap: 15 (ref 5–15)
BUN: 48 mg/dL — ABNORMAL HIGH (ref 8–23)
CO2: 24 mmol/L (ref 22–32)
Calcium: 9.7 mg/dL (ref 8.9–10.3)
Chloride: 96 mmol/L — ABNORMAL LOW (ref 98–111)
Creatinine, Ser: 1.58 mg/dL — ABNORMAL HIGH (ref 0.61–1.24)
GFR, Estimated: 44 mL/min — ABNORMAL LOW (ref >60)
Glucose, Bld: 231 mg/dL — ABNORMAL HIGH (ref 70–99)
Potassium: 4.2 mmol/L (ref 3.5–5.1)
Sodium: 135 mmol/L (ref 135–145)

## 2023-09-13 LAB — LACTIC ACID, PLASMA
Lactic Acid, Venous: 3.1 mmol/L (ref 0.5–1.9)
Lactic Acid, Venous: 4.1 mmol/L (ref 0.5–1.9)

## 2023-09-13 LAB — GLUCOSE, CAPILLARY
Glucose-Capillary: 190 mg/dL — ABNORMAL HIGH (ref 70–99)
Glucose-Capillary: 260 mg/dL — ABNORMAL HIGH (ref 70–99)
Glucose-Capillary: 198 mg/dL — ABNORMAL HIGH (ref 70–99)
Glucose-Capillary: 211 mg/dL — ABNORMAL HIGH (ref 70–99)

## 2023-09-13 LAB — CBC
HCT: 37.1 % — ABNORMAL LOW (ref 39.0–52.0)
Hemoglobin: 12.5 g/dL — ABNORMAL LOW (ref 13.0–17.0)
MCH: 30.9 pg (ref 26.0–34.0)
MCHC: 33.7 g/dL (ref 30.0–36.0)
MCV: 91.8 fL (ref 80.0–100.0)
Platelets: 270 10*3/uL (ref 150–400)
RBC: 4.04 MIL/uL — ABNORMAL LOW (ref 4.22–5.81)
RDW: 15.5 % (ref 11.5–15.5)
WBC: 10.3 10*3/uL (ref 4.0–10.5)
nRBC: 0 % (ref 0.0–0.2)

## 2023-09-13 MED ORDER — PANTOPRAZOLE SODIUM 40 MG PO TBEC
40.0000 mg | DELAYED_RELEASE_TABLET | Freq: Two times a day (BID) | ORAL | Status: DC
  Administered 2023-09-13 – 2023-09-17 (×8): 40 mg via ORAL
  Filled 2023-09-13 (×8): qty 1

## 2023-09-13 MED ORDER — LORATADINE 10 MG PO TABS
10.0000 mg | ORAL_TABLET | Freq: Every day | ORAL | Status: DC
  Administered 2023-09-13 – 2023-09-17 (×5): 10 mg via ORAL
  Filled 2023-09-13 (×5): qty 1

## 2023-09-13 NOTE — Hospital Course (Addendum)
 Hospital course / significant events:   HPI: Raymond Hays is a 78 y.o. male with medical history significant of COPD, chronic hypoxic respiratory failure on 2 L as needed, HTN, chronic HFpEF, severe mitral regurgitation status post MitraClip, chronic suprapubic Foley catheter, neurogenic bladder, PAF on Eliquis , wheelchair-bound, Parkinson's disease, HTN, IDDM, diabetic neuropathy. Resides at long term care SNF [facility] [place] Presented to ED 09/12/2023 - ENS was called for pt unresponsive to staff, EMS performed sternal rub and pt was found responsive. Brought to ED w/ concern for syncopal episode.   Patient reporting a productive cough x1 week, with intermittent yellowish sputum production, wheezing x2 to 3 days despite using more inhaler medications.  Increasingly SOB at rest.  This morning, while at breakfast table, patient  blanked out, slumped over, no fall.   06/13: to ED as above. Imaging (+)pneumonia. Pt also disimpacted and improved abd distension. WBC 14.1, initial elevated RR but then normal, not consistently meeting SIRS/sepsis. Admitted to hospitalist for syncope / pneumonia / CHF exacerbation. Abx, steroids, nebs, diuresis. Lactate increased then trending down again overnight.  06/14: pt intermittently refusing neb treatments, other therapies. Lung sounds (+)rhonchi/wheezing diffusely but stable on room air. Echo pending  06/15: good uop through yesterday/overnight. Was refusing PT/OT, echo and some meds today. Slight low K, improved Cr. Increased neb breathing tx, he is not using IS/flutter valve as directed.  06/16: pt reports improvement today, still some rales/edema but improved. Await echo read. Transition to po diuretics.  06/17: Echo EF 60-65, diastolic fxn unable to evaluate. Otherwise feeling much better today, now waiting on auth to go back to SNF/LTC     Consultants:  none  Procedures/Surgeries: none      ASSESSMENT & PLAN:   Syncope versus acute metabolic  encephalopathy Multifactorial, DDx pneumonia and COPD exacerbation, CHF/HFpEF exacerbation, less likely potential cardiac arrhythmia  Treat underlying cause(s) as noted  Continue midodrine     RLL PNA, bacterial Continue ceftriaxone    Doxycycline  due to EKG showing prolonged QTc no azithro Po ax on discharge    Acute COPD exacerbation P.o. steroid burst x5 days total  Antibiotics as above DuoNebs q6h Defer ICS d/t PNA Incentive spirometry and flutter valve encouraged O2 prn    Acute on chronic HFpEF decompensation Echo EF 60-65, diastolic fxn unable to evaluate Lasix  from IV to 40 mg po bid  Strict I&O's and daily weight Limited GDMT d/t hypotension   AKI - resolved   Monitor closely on lasix    Mild aspiration risk Reflux PPI bid  Dysphagia 3 diet  SLP following  Hypokalemia Replace as needed Monitor BMP  PAF Rate controlled Hold/dc Cardizem  due to active CHF decompensation and borderline low blood pressure metoprolol , + as needed Lopressor  for rapid rate  Eliquis    IDDM SSI    Diabetic neuropathy gabapentin    Hypothyroidism Synthroid    Parkinson's disease Sinemet    Anxiety/depression Mentation at baseline.   SSRI   Deconditioning PT evaluation  Itching/hives appears c/w contact dermatitis  2nd gen antihistamine scheduled  Po steroids for COPD may also help this  Monitor    Class 1 obesity based on BMI: Body mass index is 31.01 kg/m.[name] Significantly low or high BMI is associated with higher medical risk.  Underweight - under 18  overweight - 25 to 29 obese - 30 or more Class 1 obesity: BMI of 30.0 to 34 Class 2 obesity: BMI of 35.0 to 39 Class 3 obesity: BMI of 40.0 to 49 Super Morbid Obesity: BMI 50-59 Super-super  Morbid Obesity: BMI 60+ Healthy nutrition and physical activity advised as adjunct to other disease management and risk reduction treatments    DVT prophylaxis: Eliquis  IV fluids: no continuous IV fluids  Nutrition:  cardiac/carb diet  Central lines / other devices: chronic suprapubic cath  Code Status: DNR ACP documentation reviewed: has MOST on file in [facility] - (+)DNR and limitd interventions   TOC needs: awaiting auth to go back to LTC (waiting on the [place]) Medical barriers to dispo: none.

## 2023-09-13 NOTE — Progress Notes (Signed)
 PT Cancellation Note  Patient Details Name: Raymond Hays MRN: [id number] DOB: 20-Sep-1945   Cancelled Treatment:    Reason Eval/Treat Not Completed: Other (comment) (Pt refused due to increased stomach pain, vomiting after reports of eating too much at lunch and inability to tolerate sitting EOB. Nurse notified.) PT will re-attempt evaluation tomorrow.    Shabnam Ladd, SPT 09/13/23, 1:57 PM

## 2023-09-13 NOTE — Evaluation (Signed)
 Occupational Therapy Evaluation Patient Details Name: Keysean Savino MRN: [id number] DOB: 01/20/1946 Today's Date: 09/13/2023   History of Present Illness   per chart: Serjio Deupree is a 78 y.o. male with medical history significant of COPD, chronic hypoxic respiratory failure on 2 L as needed, HTN, chronic HFpEF, severe mitral regurgitation status post MitraClip, chronic suprapubic Foley catheter, neurogenic bladder, PAF on Eliquis , wheelchair-bound, Parkinson's disease, HTN, IDDM, diabetic neuropathy, presented with multiple complaints including worsening of productive cough wheezing and shortness of breath and syncope episode.     Patient started to have a productive cough 1 week ago, with intermittent yellowish sputum production, started to have wheezing last 2 to 3 days despite using more including medications.  Increasingly feeling shortness of breath at rest.  This morning, while at breakfast table, patient  blanked out, slumped over, no fall.  SNF staff noticed patient became unresponsive responded to sternal rubs.  EMS arrived and found patient obtunded vital signs blood pressure 103/67 heart rate 65 glucose 181 O2 saturation 95% on room air.     Clinical Impressions Pt seen for OT evaluation this date.  Pt denies any changes in his functional status with self care.  PTA, he is dependent for bathing and dressing skills and receives assistance from staff at [facility] to complete these daily tasks. He is able to feed himself,  perform basic grooming and can assist some with UB bathing and dressing.  He has a catheter and uses a bedpan.  He requires a sit to stand lift from the bed to the wheelchair and unable to propel himself except for very short distances.  He has a history of bilateral rotator cuff issues and limited with bilateral shoulder ROM.  He has functional use of his right hand basic self care tasks.  His left hand is non functional and has a history of torn ligaments and only has  partial active opening of the hand.  He declined supine to sit edge of bed, discussed with PT who will follow up to assess this skill in further detail. He appears to be at his baseline level of care otherwise.  He does not have any skilled OT needs at this time.  Please reconsult if needs arise.       If plan is discharge home, recommend the following:         Functional Status Assessment   Patient has not had a recent decline in their functional status     Equipment Recommendations         Recommendations for Other Services         Precautions/Restrictions   Precautions Precautions: Fall Restrictions Weight Bearing Restrictions Per Provider Order: No     Mobility Bed Mobility Overal bed mobility: Needs Assistance             General bed mobility comments: Pt declined OOB transfers to sit EOB this date, PT to follow up.    Transfers                          Balance                                           ADL either performed or assessed with clinical judgement   ADL Overall ADL's : At baseline  General ADL Comments: Pt appears to be at his baseline level of care, he is able to feed himself, perform basic grooming tasks.  He has a catheter all the time and uses a bedpain at baseline at [facility].  He reports the staff gets him up with a sit to stand lift and puts him in the wheelchair.  He has difficulty with manuevering the wheelchair due to his UE deficits.  He reports going short distances in the wheelchair and staff push him the rest of the time.     Vision         Perception         Praxis         Pertinent Vitals/Pain Pain Assessment Pain Assessment: No/denies pain     Extremity/Trunk Assessment Upper Extremity Assessment Upper Extremity Assessment: Right hand dominant;RUE deficits/detail;LUE deficits/detail RUE Deficits / Details: Pt with  rotator cuff issues BUE, limited ROM at shoulder.  Elbow, wrist and hand WFLs, fine motor coordination on the right intact LUE Deficits / Details: Pt with BUE rotator cuff issues and limited with active ROM and movement of left shoulder.  History of decreased use of left hand, able to partially open hand actively and has full passive motion.   Lower Extremity Assessment Lower Extremity Assessment: Defer to PT evaluation       Communication Communication Factors Affecting Communication: Difficulty expressing self   Cognition Arousal: Alert                 OT - Cognition Comments: Pt reports word finding problems in the last few days                         Cueing  General Comments          Exercises     Shoulder Instructions      Home Living Family/patient expects to be discharged to:: Skilled nursing facility                                 Additional Comments: Pt is resident at [facility].      Prior Functioning/Environment               Mobility Comments: Pt reports staff at facility transfers him using a lift from bed to wc. Non-ambulatory ADLs Comments: Pt is dependent for all ADLs except self-feeding (set up A). Staff give him a bath in shower 2x/wk, sponge bath other times. Incontinent of bowel & bladder (foley catheter, uses bed pan at facility).    OT Problem List:     OT Treatment/Interventions:        OT Goals(Current goals can be found in the care plan section)    Pt seen for evaluation only therefore no goals or frequency is indicated at this time.   OT Frequency:       Co-evaluation              AM-PAC OT 6 Clicks Daily Activity     Outcome Measure Help from another person eating meals?: None Help from another person taking care of personal grooming?: None Help from another person toileting, which includes using toliet, bedpan, or urinal?: Total Help from another person bathing (including  washing, rinsing, drying)?: A Lot Help from another person to put on and taking off regular upper body clothing?: A Lot Help from another person to put  on and taking off regular lower body clothing?: Total 6 Click Score: 14   End of Session    Activity Tolerance:   Patient left: in bed;with call bell/phone within reach;with bed alarm set  OT Visit Diagnosis: Muscle weakness (generalized) (M62.81)                Time: 2778-2423 OT Time Calculation (min): 16 min Charges:  OT General Charges $OT Visit: 1 Visit OT Evaluation $OT Eval Low Complexity: 1 Low  Keval Nam T Denzal Meir, OTR/L, CLT Makoa Satz 09/13/2023, 4:31 PM

## 2023-09-13 NOTE — Plan of Care (Signed)
   Problem: Coping: Goal: Ability to adjust to condition or change in health will improve Outcome: Progressing   Problem: Fluid Volume: Goal: Ability to maintain a balanced intake and output will improve Outcome: Progressing   Problem: Skin Integrity: Goal: Risk for impaired skin integrity will decrease Outcome: Progressing

## 2023-09-13 NOTE — Progress Notes (Signed)
 Received call from Arvin Bishop from lab regarding pt's lactic acid of 5.9. NP Elisabeth Guild made aware. No further order at this point. MD. Ordered repeat. Pt is asymptomatic. VSS. Will continue to monitor.

## 2023-09-13 NOTE — Evaluation (Signed)
 Clinical/Bedside Swallow Evaluation Patient Details  Name: Raymond Hays MRN: [id number] Date of Birth: Mar 28, 1946  Today's Date: 09/13/2023 Time: SLP Start Time (ACUTE ONLY): 1055 SLP Stop Time (ACUTE ONLY): 1145 SLP Time Calculation (min) (ACUTE ONLY): 50 min  Past Medical History:  Past Medical History:  Diagnosis Date   Allergic rhinitis    Anxiety    Arthritis    Atrial fibrillation (HCC)    Chronic indwelling Foley catheter    COPD (chronic obstructive pulmonary disease) (HCC)    Cough    Diabetes (HCC)    Essential (primary) hypertension    Hemorrhoid    Hyperlipidemia    PAF (paroxysmal atrial fibrillation) (HCC)    Primary osteoarthritis, unspecified shoulder    Retention of urine, unspecified    Spinal stenosis    UTI (urinary tract infection)    Past Surgical History:  Past Surgical History:  Procedure Laterality Date   APPENDECTOMY     BACK SURGERY     COLONOSCOPY N/A 10/27/2020   Procedure: COLONOSCOPY;  Surgeon: Luke Salaam, MD;  Location: [facility] ENDOSCOPY;  Service: Gastroenterology;  Laterality: N/A;   ELBOW SURGERY     ESOPHAGOGASTRODUODENOSCOPY (EGD) WITH PROPOFOL  N/A 10/27/2020   Procedure: ESOPHAGOGASTRODUODENOSCOPY (EGD) WITH PROPOFOL ;  Surgeon: Luke Salaam, MD;  Location: [facility] ENDOSCOPY;  Service: Gastroenterology;  Laterality: N/A;   HERNIA REPAIR     KNEE SURGERY     neck     PARTIAL HIP ARTHROPLASTY Bilateral    TEE WITHOUT CARDIOVERSION N/A 10/19/2020   Procedure: TRANSESOPHAGEAL ECHOCARDIOGRAM (TEE);  Surgeon: Devorah Fonder, MD;  Location: [facility] ORS;  Service: Cardiovascular;  Laterality: N/A;   TONSILLECTOMY     HPI:  Pt is a 78 y.o. male with medical history significant of COPD, chronic hypoxic respiratory failure on 2 L as needed, HTN, Obesity, Ileus, chronic HFpEF, severe mitral regurgitation status post MitraClip, chronic suprapubic Foley catheter, neurogenic bladder,  UTI, PAF on Eliquis , wheelchair-bound, Parkinson's disease, HTN, IDDM,  diabetic neuropathy, presented with multiple complaints including worsening of productive cough wheezing and shortness of breath and syncope episode.     Patient started to have a productive cough 1 week ago, with intermittent yellowish sputum production, started to have wheezing last 2 to 3 days despite using more including medications.  Increasingly feeling shortness of breath at rest.  While at breakfast table on day of admit, patient  blanked out, slumped over, no fall.   CT Imaging at admit: Lung bases demonstrate partial right lower lobe  consolidation with bronchiectasis. Coronary vascular calcification. Large stool burden throughout the colon with impacted appearing  feces at the rectum suggesting constipation.    Assessment / Plan / Recommendation  Clinical Impression   Pt seen for BSE today. Pt awake, verbal and followed basic directions w/ cue. Distracted but easily redirected attention w/ cue. Min WOB/SOB w/ ANY exertion including talking; frequently talked and had to be cued to pause to calm his breathing. Hack/congested cough at Baseline PRIOR TO any po's. Pt endorsed he vomited 3x this morning. NSG and MD aware.  On [place] O2 2L; afebrile. WBC WNL. Lactic Acid: 3.1.   OF NOTE: Pt endorses s/s of REFLUX; reports episodes Belching and having to spit up/out phlegm. Also stated he had N/V this morning after eating my breakfast quickly. Pt denied any specific trouble swallowing. He is Missing Many Dentition which impacts effective Mastication of solid foods.    Pt appears to present w/ functional oropharyngeal phase swallowing w/ No overt oropharyngeal phase  dysphagia appreciated during oral intake of trials in setting of Missing Many Dentition, which impacts effective mastication; No neuromuscular swallowing deficits appreciated. Pt appears at reduced risk for aspiration from an oropharyngeal phase standpoint following general aspiration precautions.  HOWEVER, pt has a baseline of REFLUX s/s,  Parkinson's Dis, per chart, and pulmonary decline currently. He is also bed/chair bound.  ANY Dysmotility or Regurgitation of Reflux material can increase risk for aspiration of the Reflux material during Retrograde flow thus impact Voicing and Pulmonary status. Pt described issues of globus, cough, and spitting of phlegm surrounding meals but this has not changed his eating habits.     Pt sat upright in bed(positioning support given) and consumed several trials of thin liquids Via Cup/Straw, purees, and soft solid foods w/ No overt clinical s/s of aspiration noted: clear vocal quality b/t trials, no decline in O2 sats(97%), no cough. Rest Breaks given b/t trials d/t pt's increased Pulmonary effort/RR w/ the exertion of po tasks- pt was also instructed on Not Talking during food/drink intake. Oral phase appeared [facility] for bolus management and timely A-P transfer/clearing of material. Mastication of increased textured foods was min increased in time/effort d/t Missing Dentition. W/ Time, he swallowed/cleared appropriately. OM exam was [facility] for oral clearing; lingual/labial movements. No unilateral weakness. Speech clear.    Recommend continue a fairly Regular diet per pt request(cut/chopped meats, moistened foods) w/ thin liquids. General aspiration precautions. Rest Breaks for conservation of energy during meals. Also choosing foods easy to eat/chew for conservation of energy. REFLUX PRECAUTIONS: Rest Breaks during meals/oral intake to allow for Esophageal clearing. Remain upright post meals for ~1 hour; HOB elevated at night when sleeping. Pills in Puree for safer swallowing. D/w MD re: a PPI BID for pt.   Recommend pt f/u w/ GI for assessment/management of REFLUX s/s and Education. Discussion and handouts given on REFLUX and aspiration precautions. No further skilled ST services indicated as pt appears at his Baseline. MD to reconsult ST services if any new needs while admitted. NSG updated. Pt appreciative  of Education information.   SLP Visit Diagnosis: Dysphagia, unspecified (R13.10) (Missing Dentition impacting mastication effectiveness; Pulmonary decline w/ WOB w/ ANY exertion; baseline Parkinson's Dis.)    Aspiration Risk  Mild aspiration risk;Risk for inadequate nutrition/hydration (reduced when following general aspiration precautions)    Diet Recommendation   Thin;Dysphagia 3 (mechanical soft);Age appropriate regular (more mech soft meats; moistened foods) = a fairly Regular diet per pt request(cut/chopped meats, moistened foods) w/ thin liquids. General aspiration precautions. Rest Breaks for conservation of energy during meals. Also choosing foods easy to eat/chew for conservation of energy. REFLUX PRECAUTIONS: Rest Breaks during meals/oral intake to allow for Esophageal clearing. Remain upright post meals for ~1 hour; HOB elevated at night when sleeping.  Medication Administration: Whole meds with puree (for safer swallowing)    Other  Recommendations Recommended Consults: Consider GI evaluation (Dietician) Oral Care Recommendations: Oral care BID;Oral care before and after PO;Staff/trained caregiver to provide oral care (support pt)     Assistance Recommended at Discharge  Intermittent for support and follow through w/ precautions at meals  Functional Status Assessment Patient has had a recent decline in their functional status and/or demonstrates limited ability to make significant improvements in function in a reasonable and predictable amount of time (missing Dentition)  Frequency and Duration  (n/a)   (n/a)       Prognosis Prognosis for improved oropharyngeal function: Fair (-Good) Barriers to Reach Goals: Time post onset;Severity of deficits;Behavior;Motivation  Barriers/Prognosis Comment: GERD, Cognitive insight; Missing Dentition impacting mastication effectiveness; Pulmonary decline w/ WOB w/ ANY exertion; baseline Parkinson's Dis.      Swallow Study   General Date of  Onset: 09/12/23 HPI: Pt is a 78 y.o. male with medical history significant of COPD, chronic hypoxic respiratory failure on 2 L as needed, HTN, Obesity, Ileus, chronic HFpEF, severe mitral regurgitation status post MitraClip, chronic suprapubic Foley catheter, neurogenic bladder,  UTI, PAF on Eliquis , wheelchair-bound, Parkinson's disease, HTN, IDDM, diabetic neuropathy, presented with multiple complaints including worsening of productive cough wheezing and shortness of breath and syncope episode.     Patient started to have a productive cough 1 week ago, with intermittent yellowish sputum production, started to have wheezing last 2 to 3 days despite using more including medications.  Increasingly feeling shortness of breath at rest.  While at breakfast table on day of admit, patient  blanked out, slumped over, no fall.   CT Imaging at admit: Lung bases demonstrate partial right lower lobe  consolidation with bronchiectasis. Coronary vascular calcification. Large stool burden throughout the colon with impacted appearing  feces at the rectum suggesting constipation. Type of Study: Bedside Swallow Evaluation Previous Swallow Assessment: 2024- more of a mech soft diet d/t Missing Dentition and ease of oral phase Diet Prior to this Study: Regular;Thin liquids (Level 0) Temperature Spikes Noted: No (wbc 10.3; Lactic acid 3.1) Respiratory Status: Nasal cannula (2L) History of Recent Intubation: No Behavior/Cognition: Alert;Cooperative;Pleasant mood;Distractible;Requires cueing Oral Cavity Assessment: Within Functional Limits Oral Care Completed by SLP: Recent completion by staff Oral Cavity - Dentition: Missing dentition;Poor condition (many missing) Vision: Functional for self-feeding Self-Feeding Abilities: Able to feed self;Needs assist;Needs set up Patient Positioning: Upright in bed (supported positioning) Baseline Vocal Quality: Normal Volitional Cough: Strong;Congested Volitional Swallow: Able to  elicit    Oral/Motor/Sensory Function Overall Oral Motor/Sensory Function: Within functional limits (no unilateral weakness)   Ice Chips Ice chips: Not tested   Thin Liquid Thin Liquid: Within functional limits Presentation: Cup;Self Fed;Straw (~4-5 ozs total) Other Comments: water, gingerale    Nectar Thick Nectar Thick Liquid: Not tested   Honey Thick Honey Thick Liquid: Not tested   Puree Puree: Within functional limits Presentation: Spoon;Self Fed (7-8 trials)   Solid     Solid: Impaired (mildly d/t lacking Dentition for effective masticaiton which increased effort/time) Presentation: Self Fed (3 trials) Oral Phase Impairments: Impaired mastication (dentition) Pharyngeal Phase Impairments:  (none)        Darla Edward, MS, CCC-SLP Speech Language Pathologist Rehab Services; [facility] [place] [phone] (ascom) Mallorie Norrod 09/13/2023,1:37 PM

## 2023-09-13 NOTE — Progress Notes (Signed)
 Patient refused orthostatic vitals. I notified provider.

## 2023-09-13 NOTE — Progress Notes (Signed)
 PT Cancellation Note  Patient Details Name: Raymond Hays MRN: [id number] DOB: Aug 28, 1945   Cancelled Treatment:    Reason Eval/Treat Not Completed: Other (comment).  PT consult received.  Chart reviewed.  Attempted to see pt at 0820 but pt declining therapy d/t just having a really big breakfast and wanting to rest.  2nd attempt (1026) nursing reporting just giving pt breathing treatments and recommending holding therapy at this time (pt not feeling well).  Will re-attempt PT evaluation at a later date/time.  Amador Junes, PT 09/13/23, 10:28 AM

## 2023-09-13 NOTE — Progress Notes (Signed)
 PROGRESS NOTE    Raymond Hays   ZOX:[id number] DOB: 05-07-1945  DOA: 09/12/2023 Date of Service: 09/13/23 which is hospital day 1  PCP: Raymond Clause, MD    Hospital course / significant events:   HPI: Raymond Hays is a 78 y.o. male with medical history significant of COPD, chronic hypoxic respiratory failure on 2 L as needed, HTN, chronic HFpEF, severe mitral regurgitation status post MitraClip, chronic suprapubic Foley catheter, neurogenic bladder, PAF on Eliquis , wheelchair-bound, Parkinson's disease, HTN, IDDM, diabetic neuropathy. Resides at long term care SNF [facility] Presented to ED 09/12/2023 - ENS was called for pt unresponsive to staff, EMS performed sternal rub and pt was found responsive. Brought to ED w/ concern for syncopal episode.   Patient reporting a productive cough x1 week, with intermittent yellowish sputum production, wheezing x2 to 3 days despite using more inhaler medications.  Increasingly SOB at rest.  This morning, while at breakfast table, patient  blanked out, slumped over, no fall.   06/13: to ED as above. Imaging (+)pneumonia. Pt also disimpacted and improved abd distension. WBC 14.1, initial elevated RR but then normal, not consistently meeting SIRS/sepsis. Admitted to hospitalist for syncope / pneumonia / CHF exacerbation. Abx, steroids, nebs, diuresis. Lactate increased then trending down again overnight.  06/14: pt intermittently refusing neb treatments, other therapies. Lung sounds (+)rhonchi/wheezing diffusely but stable on room air. Echo pending      Consultants:  none  Procedures/Surgeries: none      ASSESSMENT & PLAN:   Syncope versus acute metabolic encephalopathy Multifactorial, DDx pneumonia and COPD exacerbation, CHF/HFpEF exacerbation, potential cardiac arrhythmia  Telemonitoring orthostatic vital signs Echocardiogram   RLL PNA, bacterial Continue ceftriaxone   change azithromycin  to doxycycline  due to EKG showing  prolonged QTc Denied any history of aspiration   Acute COPD exacerbation P.o. steroid Antibiotics as above DuoNebs ICS and LABA Incentive spirometry and flutter valve   Acute on chronic HFpEF decompensation Lasix  20 mg twice daily Echocardiogram Strict I&O's and daily weight   AKI Monitor closely on lasix    Mild aspiration risk Reflux PPI bid  Dysphagia 3 diet  SLP following  PAF Rate controlled hold Cardizem  due to active CHF decompensation and borderline low blood pressure metoprolol , + as needed Lopressor  for rapid rate  CT head negative for intracranial bleeding, continue Eliquis    IDDM SSI    Diabetic neuropathy gabapentin    Hypothyroidism Synthroid    Parkinson's disease Sinemet    Anxiety/depression Mentation at baseline.   SSRI   Deconditioning PT evaluation  Itching/hives appears c/w contact dermatitis  2nd gen antihistamine scheduled  Po steroids for COPD may also help this  Monitor    Class 1 obesity based on BMI: Body mass index is 31.01 kg/m.Raymond Hays Significantly low or high BMI is associated with higher medical risk.  Underweight - under 18  overweight - 25 to 29 obese - 30 or more Class 1 obesity: BMI of 30.0 to 34 Class 2 obesity: BMI of 35.0 to 39 Class 3 obesity: BMI of 40.0 to 49 Super Morbid Obesity: BMI 50-59 Super-super Morbid Obesity: BMI 60+ Healthy nutrition and physical activity advised as adjunct to other disease management and risk reduction treatments    DVT prophylaxis: Eliquis  IV fluids: no continuous IV fluids  Nutrition: cardiac/carb diet  Central lines / other devices: chronic suprapubic cath  Code Status: DNR ACP documentation reviewed: has MOST on file in [facility] - (+)DNR and limitd interventions   TOC needs: TBD, back to SNF when stable, possible need  PT/OT therre Medical barriers to dispo: tx as above. Expected medical readiness for discharge 1-2 days / pending clinical course .               Subjective / Brief ROS:  Patient reports itching, SOB  Denies CP Pain controlled.  Denies new weakness.  Tolerating diet.  Reports no concerns w/ urination/defecation.  RN reports he is intermittently refusing treatments  Family Communication: none at this time    Objective Findings:  Vitals:   09/13/23 0526 09/13/23 0706 09/13/23 0740 09/13/23 1429  BP: 109/81  132/75 (!) 132/94  Pulse: 89  90 91  Resp: 18  20 19   Temp: 98.1 F (36.7 C)  98.6 F (37 C) 97.6 F (36.4 C)  TempSrc:      SpO2: 100% 98% 96% 96%  Weight:      Height:        Intake/Output Summary (Last 24 hours) at 09/13/2023 1513 Last data filed at 09/13/2023 1031 Gross per 24 hour  Intake 240 ml  Output 360 ml  Net -120 ml   Filed Weights   09/12/23 1329  Weight: 89.8 kg    Examination:  Physical Exam Constitutional:      General: He is not in acute distress.  Cardiovascular:     Rate and Rhythm: Normal rate and regular rhythm.  Pulmonary:     Effort: Pulmonary effort is normal. No respiratory distress.     Breath sounds: Wheezing, rhonchi and rales (mild) present.  Abdominal:     Palpations: Abdomen is soft.   Musculoskeletal:     Right lower leg: Edema present.     Left lower leg: Edema present.   Skin:    General: Skin is warm and dry.     Findings: Rash (hives over chest) present.   Neurological:     Mental Status: He is alert and oriented to person, place, and time. Mental status is at baseline.   Psychiatric:        Behavior: Behavior normal.          Scheduled Medications:   acidophilus  1 capsule Oral Daily   allopurinol   200 mg Oral Daily   apixaban   5 mg Oral BID   atorvastatin   20 mg Oral QHS   [START ON 09/15/2023] bisacodyl   10 mg Rectal Q3 days   budesonide -glycopyrrolate-formoterol  2 puff Inhalation BID   carbidopa -levodopa   1 tablet Oral TID AC   docusate sodium   100 mg Oral Daily   fluticasone   2 spray Each Nare Daily    furosemide   20 mg Intravenous BID   gabapentin   800 mg Oral QID   insulin  aspart  0-15 Units Subcutaneous TID WC   ipratropium  2 spray Each Nare TID   lactase  3,000 Units Oral TID WC   lactulose   30 g Oral Daily   levothyroxine   75 mcg Oral QAC breakfast   loratadine   10 mg Oral Daily   methocarbamol   1,000 mg Oral Q12H   metoprolol  tartrate  12.5 mg Oral BID   midodrine   2.5 mg Oral TID WC   oxybutynin   5 mg Oral BID   pantoprazole   40 mg Oral BID   polyethylene glycol  17 g Oral Daily   predniSONE   40 mg Oral Q breakfast    Continuous Infusions:  cefTRIAXone  (ROCEPHIN )  IV 2 g (09/13/23 1017)   doxycycline  (VIBRAMYCIN ) IV 100 mg (09/13/23 1133)    PRN Medications:  acetaminophen  **OR** acetaminophen , albuterol ,  dextromethorphan -guaiFENesin , hydrocortisone , hydrOXYzine , metoCLOPramide  (REGLAN ) injection, metoprolol  tartrate, promethazine , senna  Antimicrobials from admission:  Anti-infectives (From admission, onward)    Start     Dose/Rate Route Frequency Ordered Stop   09/13/23 1000  cefTRIAXone  (ROCEPHIN ) 1 g in sodium chloride  0.9 % 100 mL IVPB  Status:  Discontinued        1 g 200 mL/hr over 30 Minutes Intravenous Every 24 hours 09/12/23 1720 09/12/23 1720   09/13/23 1000  doxycycline  (VIBRAMYCIN ) 100 mg in sodium chloride  0.9 % 250 mL IVPB        100 mg 125 mL/hr over 120 Minutes Intravenous Every 12 hours 09/12/23 1720     09/13/23 1000  cefTRIAXone  (ROCEPHIN ) 2 g in sodium chloride  0.9 % 100 mL IVPB        2 g 200 mL/hr over 30 Minutes Intravenous Every 24 hours 09/12/23 1720     09/12/23 1715  cefTRIAXone  (ROCEPHIN ) 2 g in sodium chloride  0.9 % 100 mL IVPB        2 g 200 mL/hr over 30 Minutes Intravenous Once 09/12/23 1711 09/12/23 1824   09/12/23 1715  azithromycin  (ZITHROMAX ) 500 mg in sodium chloride  0.9 % 250 mL IVPB  Status:  Discontinued        500 mg 250 mL/hr over 60 Minutes Intravenous  Once 09/12/23 1711 09/13/23 1610           Data Reviewed:   I have personally reviewed the following...  CBC: Recent Labs  Lab 09/12/23 1345 09/13/23 0903  WBC 14.1* 10.3  NEUTROABS 11.7*  --   HGB 13.0 12.5*  HCT 39.8 37.1*  MCV 93.9 91.8  PLT 268 270   Basic Metabolic Panel: Recent Labs  Lab 09/12/23 1345 09/13/23 0903  NA 134* 135  K 4.0 4.2  CL 97* 96*  CO2 26 24  GLUCOSE 162* 231*  BUN 36* 48*  CREATININE 1.24 1.58*  CALCIUM  9.6 9.7   GFR: Estimated Creatinine Clearance: 40.5 mL/min (A) (by C-G formula based on SCr of 1.58 mg/dL (H)). Liver Function Tests: Recent Labs  Lab 09/12/23 1345  AST 20  ALT 6  ALKPHOS 113  BILITOT 1.1  PROT 7.8  ALBUMIN 3.5   No results for input(s): LIPASE, AMYLASE in the last 168 hours. No results for input(s): AMMONIA in the last 168 hours. Coagulation Profile: No results for input(s): INR, PROTIME in the last 168 hours. Cardiac Enzymes: No results for input(s): CKTOTAL, CKMB, CKMBINDEX, TROPONINI in the last 168 hours. BNP (last 3 results) No results for input(s): PROBNP in the last 8760 hours. HbA1C: No results for input(s): HGBA1C in the last 72 hours. CBG: Recent Labs  Lab 09/13/23 0741 09/13/23 1147  GLUCAP 190* 260*   Lipid Profile: No results for input(s): CHOL, HDL, LDLCALC, TRIG, CHOLHDL, LDLDIRECT in the last 72 hours. Thyroid Function Tests: No results for input(s): TSH, T4TOTAL, FREET4, T3FREE, THYROIDAB in the last 72 hours. Anemia Panel: No results for input(s): VITAMINB12, FOLATE, FERRITIN, TIBC, IRON, RETICCTPCT in the last 72 hours. Most Recent Urinalysis On File:     Component Value Date/Time   COLORURINE YELLOW (A) 07/03/2023 1558   APPEARANCEUR CLOUDY (A) 07/03/2023 1558   LABSPEC 1.012 07/03/2023 1558   PHURINE 7.0 07/03/2023 1558   GLUCOSEU NEGATIVE 07/03/2023 1558   HGBUR LARGE (A) 07/03/2023 1558   BILIRUBINUR NEGATIVE 07/03/2023 1558   KETONESUR NEGATIVE 07/03/2023 1558   PROTEINUR  NEGATIVE 07/03/2023 1558   NITRITE NEGATIVE 07/03/2023 1558   LEUKOCYTESUR  MODERATE (A) 07/03/2023 1558   Sepsis Labs: @LABRCNTIP (procalcitonin:4,lacticidven:4) Microbiology: Recent Results (from the past 240 hours)  Blood culture (routine x 2)     Status: None (Preliminary result)   Collection Time: 09/12/23  5:46 PM   Specimen: BLOOD  Result Value Ref Range Status   Specimen Description BLOOD BLOOD LEFT FOREARM  Final   Special Requests   Final    BOTTLES DRAWN AEROBIC AND ANAEROBIC Blood Culture results may not be optimal due to an inadequate volume of blood received in culture bottles   Culture   Final    NO GROWTH < 12 HOURS Performed at [facility], [address], [place], [place] [address]    Report Status PENDING  Incomplete  Blood culture (routine x 2)     Status: None (Preliminary result)   Collection Time: 09/12/23  5:46 PM   Specimen: BLOOD  Result Value Ref Range Status   Specimen Description BLOOD LEFT ANTECUBITAL  Final   Special Requests   Final    BOTTLES DRAWN AEROBIC AND ANAEROBIC Blood Culture results may not be optimal due to an inadequate volume of blood received in culture bottles   Culture   Final    NO GROWTH < 12 HOURS Performed at [facility], [address], [place], [place] [address]    Report Status PENDING  Incomplete      Radiology Studies last 3 days: CT ABDOMEN PELVIS WO CONTRAST Addendum Date: 09/12/2023 ADDENDUM REPORT: 09/12/2023 16:31 ADDENDUM: Partial right lower lobe consolidation with bronchiectasis, likely a component of pulmonary scarring. Difficult to exclude recurrent infection or aspiration. Electronically Signed   By: Esmeralda Hedge M.D.   On: 09/12/2023 16:31   Result Date: 09/12/2023 CLINICAL DATA:  Unresponsive distended abdomen EXAM: CT ABDOMEN AND PELVIS WITHOUT CONTRAST TECHNIQUE: Multidetector CT imaging of the abdomen and pelvis was performed following the standard protocol without IV contrast.  RADIATION DOSE REDUCTION: This exam was performed according to the departmental dose-optimization program which includes automated exposure control, adjustment of the mA and/or kV according to patient size and/or use of iterative reconstruction technique. COMPARISON:  Radiograph 05/01/2023, CT 04/29/2023, 09/29/2022, 04/16/2022 FINDINGS: Lower chest: Lung bases demonstrate partial right lower lobe consolidation with bronchiectasis. Coronary vascular calcification. Postsurgical changes at the mitral valve. Cardiomegaly. Hepatobiliary: Hepatic cyst. Small gallstone. No biliary dilatation Pancreas: Unremarkable. No pancreatic ductal dilatation or surrounding inflammatory changes. Spleen: Normal in size without focal abnormality. Adrenals/Urinary Tract: Stable adrenal glands. No hydronephrosis. Stable left renal cyst and subcentimeter renal lesions too small to further characterize, no specific imaging follow-up is recommended. Possible hyperdense exophytic lesion lower pole left kidney measuring 13 mm on series 2, image 52. Suprapubic Foley catheter Stomach/Bowel: Stomach nonenlarged. No dilated small bowel. Large stool burden throughout the colon with impacted appearing feces at the rectum. Marked sigmoid colon air distension, chronic finding. Vascular/Lymphatic: Aortic atherosclerosis. No enlarged abdominal or pelvic lymph nodes. Reproductive: Prostate is unremarkable. Other: Negative for pelvic effusion or free air. Musculoskeletal: Bilateral hip replacements. Multilevel degenerative changes. Postsurgical changes of the lower lumbar spine. IMPRESSION: 1. Large stool burden throughout the colon with impacted appearing feces at the rectum suggesting constipation. Marked sigmoid colon air distension, chronic finding. 2. Cholelithiasis. 3. Possible hyperdense exophytic lesion lower pole left kidney measuring 13 mm. Recommend further evaluation with nonemergent renal ultrasound. 4. Partial right lower lobe consolidation  with bronchiectasis, possible pneumonia. 5. Aortic atherosclerosis. Aortic Atherosclerosis (ICD10-I70.0). Electronically Signed: By: Esmeralda Hedge M.D. On: 09/12/2023 16:24  CT HEAD WO CONTRAST ( ) Result Date: 09/12/2023 CLINICAL DATA:  Head trauma. EXAM: CT HEAD WITHOUT CONTRAST TECHNIQUE: Contiguous axial images were obtained from the base of the skull through the vertex without intravenous contrast. RADIATION DOSE REDUCTION: This exam was performed according to the departmental dose-optimization program which includes automated exposure control, adjustment of the mA and/or kV according to patient size and/or use of iterative reconstruction technique. COMPARISON:  CT of the head dated June 06, 2023. FINDINGS: Brain: There is generalized cerebral and cerebellar volume loss present. There are focal encephalomalacia changes within the left frontal lobe related to remote cortical infarct. There is mild to moderate periventricular white matter disease. There is no evidence of acute intracranial injury. No evidence of hemorrhage, mass or hydrocephalus. Vascular: Moderate calcifications within the carotid siphons. Skull: The calvarium is intact and unremarkable. There is basilar invagination, as before. Sinuses/Orbits: The visualized paranasal sinuses and mastoid air cells are clear. The orbits are unremarkable. Other: None. IMPRESSION: 1. No evidence of acute intracranial injury. 2. 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 Electronically Signed   By: Maribeth Shivers M.D.   On: 09/12/2023 16:14   DG Chest Portable 1 View Result Date: 09/12/2023 CLINICAL DATA:  sob EXAM: PORTABLE CHEST - 1 VIEW COMPARISON:  July 03, 2023 FINDINGS: Low lung volumes. No focal airspace consolidation, pleural effusion, or pneumothorax. Unchanged cardiomegaly. Mitral valve clips. Tortuous aorta with aortic atherosclerosis. no acute fracture or destructive lesion. Osteopenia. Multilevel  thoracic osteophytosis. Diffuse gaseous distension of the transverse colon. IMPRESSION: 1. Cardiomegaly with low lung volumes. Bilateral perihilar interstitial opacities, likely bronchovascular crowding from low lung volumes. Alternatively, developing interstitial edema could have this appearance. 2. Gaseous distention of the transverse colon. Electronically Signed   By: Rance Burrows M.D.   On: 09/12/2023 15:26         Somalia Segler, DO [facility] 09/13/2023, 3:13 PM    Dictation software may have been used to generate the above note. Typos may occur and escape review in typed/dictated notes. Please contact Dr Authur Leghorn directly for clarity if needed.  Staff may message me via secure chat in Epic  but this may not receive an immediate response,  please page me for urgent matters!  If 7PM-7AM, please contact night coverage [email or web address]

## 2023-09-14 ENCOUNTER — Inpatient Hospital Stay
Admit: 2023-09-14 | Discharge: 2023-09-14 | Disposition: A | Discharge status: Home / Self Care | Attending: Internal Medicine | Admitting: Internal Medicine

## 2023-09-14 ENCOUNTER — Inpatient Hospital Stay: Admit: 2023-09-14

## 2023-09-14 LAB — GLUCOSE, CAPILLARY
Glucose-Capillary: 163 mg/dL — ABNORMAL HIGH (ref 70–99)
Glucose-Capillary: 140 mg/dL — ABNORMAL HIGH (ref 70–99)
Glucose-Capillary: 157 mg/dL — ABNORMAL HIGH (ref 70–99)
Glucose-Capillary: 195 mg/dL — ABNORMAL HIGH (ref 70–99)

## 2023-09-14 LAB — BASIC METABOLIC PANEL WITH GFR
Anion gap: 11 (ref 5–15)
BUN: 49 mg/dL — ABNORMAL HIGH (ref 8–23)
CO2: 25 mmol/L (ref 22–32)
Calcium: 9.3 mg/dL (ref 8.9–10.3)
Chloride: 99 mmol/L (ref 98–111)
Creatinine, Ser: 1.16 mg/dL (ref 0.61–1.24)
GFR, Estimated: >60 mL/min (ref >60)
Glucose, Bld: 161 mg/dL — ABNORMAL HIGH (ref 70–99)
Potassium: 3.3 mmol/L — ABNORMAL LOW (ref 3.5–5.1)
Sodium: 135 mmol/L (ref 135–145)

## 2023-09-14 MED ORDER — IPRATROPIUM-ALBUTEROL 0.5-2.5 (3) MG/3ML IN SOLN
3.0000 mL | Freq: Four times a day (QID) | RESPIRATORY_TRACT | Status: DC
  Administered 2023-09-14 – 2023-09-16 (×6): 3 mL via RESPIRATORY_TRACT
  Filled 2023-09-14 (×9): qty 3

## 2023-09-14 MED ORDER — TRAZODONE HCL 100 MG PO TABS
100.0000 mg | ORAL_TABLET | Freq: Every day | ORAL | Status: DC
  Administered 2023-09-14 – 2023-09-16 (×3): 100 mg via ORAL
  Filled 2023-09-14 (×3): qty 1

## 2023-09-14 MED ORDER — DOXYCYCLINE HYCLATE 100 MG PO TABS
100.0000 mg | ORAL_TABLET | Freq: Two times a day (BID) | ORAL | Status: DC
  Administered 2023-09-14 – 2023-09-17 (×6): 100 mg via ORAL
  Filled 2023-09-14 (×6): qty 1

## 2023-09-14 MED ORDER — POTASSIUM CHLORIDE CRYS ER 20 MEQ PO TBCR
40.0000 meq | EXTENDED_RELEASE_TABLET | Freq: Every day | ORAL | Status: DC
  Administered 2023-09-14 – 2023-09-17 (×4): 40 meq via ORAL
  Filled 2023-09-14 (×4): qty 2

## 2023-09-14 MED ORDER — DM-GUAIFENESIN ER 30-600 MG PO TB12
2.0000 | ORAL_TABLET | Freq: Two times a day (BID) | ORAL | Status: DC
  Administered 2023-09-14 – 2023-09-17 (×7): 2 via ORAL
  Filled 2023-09-14 (×4): qty 2
  Filled 2023-09-14: qty 1
  Filled 2023-09-14 (×2): qty 2

## 2023-09-14 NOTE — Progress Notes (Signed)
 Patient stated that he is not feeling well. He stated that his abdomen was so distended that he could not eat and that he needed to have a BM. He had his 1000 lactulose  still sitting on his bedside table which he was refusing to take. He also stated that he could not move. I mentioned that PT had come by earlier and he sent them away but that I could ask them to come back and he said that he did not want them to come.  I notified Dr. Authur Leghorn face to face about the mentioned concerns.

## 2023-09-14 NOTE — Progress Notes (Signed)
 Attempted Echocardiogram, patient declined because he is not feeling well, try again tomorrow.

## 2023-09-14 NOTE — Plan of Care (Signed)
   Problem: Coping: Goal: Ability to adjust to condition or change in health will improve Outcome: Progressing   Problem: Fluid Volume: Goal: Ability to maintain a balanced intake and output will improve Outcome: Progressing

## 2023-09-14 NOTE — Progress Notes (Signed)
 PT Cancellation Note  Patient Details Name: Raymond Hays MRN: [id number] DOB: [birth year]   Cancelled Treatment:    Reason Eval/Treat Not Completed:  (Pt refused due to increased stomach pain and vomiting, stated inability to tolerate sitting EOB. Nurse notified.) PT will re-attempt evaluation tomorrow.)   Raymond Hays, SPT 09/14/23, 11:37 AM

## 2023-09-14 NOTE — Progress Notes (Signed)
 PROGRESS NOTE    Raymond Hays   ZOX:[id number] DOB: [birth year]  DOA: 09/12/2023 Date of Service: 09/14/23 which is hospital day 2  PCP: Rogenia Clause, MD    Hospital course / significant events:   HPI: Raymond Hays is a 78 y.o. male with medical history significant of COPD, chronic hypoxic respiratory failure on 2 L as needed, HTN, chronic HFpEF, severe mitral regurgitation status post MitraClip, chronic suprapubic Foley catheter, neurogenic bladder, PAF on Eliquis , wheelchair-bound, Parkinson's disease, HTN, IDDM, diabetic neuropathy. Resides at long term care SNF [facility] [place] Presented to ED 09/12/2023 - ENS was called for pt unresponsive to staff, EMS performed sternal rub and pt was found responsive. Brought to ED w/ concern for syncopal episode.   Patient reporting a productive cough x1 week, with intermittent yellowish sputum production, wheezing x2 to 3 days despite using more inhaler medications.  Increasingly SOB at rest.  This morning, while at breakfast table, patient  blanked out, slumped over, no fall.   06/13: to ED as above. Imaging (+)pneumonia. Pt also disimpacted and improved abd distension. WBC 14.1, initial elevated RR but then normal, not consistently meeting SIRS/sepsis. Admitted to hospitalist for syncope / pneumonia / CHF exacerbation. Abx, steroids, nebs, diuresis. Lactate increased then trending down again overnight.  06/14: pt intermittently refusing neb treatments, other therapies. Lung sounds (+)rhonchi/wheezing diffusely but stable on room air. Echo pending  06/15: good uop through yesterday/overnight. Was refusing PT/OT, echo and some meds today. Slight low K, improved Cr. Increased neb breathing tx, he is not using IS/flutter valve as directed.      Consultants:  none  Procedures/Surgeries: none      ASSESSMENT & PLAN:   Syncope versus acute metabolic encephalopathy Multifactorial, DDx pneumonia and COPD exacerbation, CHF/HFpEF exacerbation,  potential cardiac arrhythmia  Telemonitoring orthostatic vital signs Echocardiogram - pt refused this today   RLL PNA, bacterial Continue ceftriaxone    Doxycycline  due to EKG showing prolonged QTc no azithro   Acute COPD exacerbation P.o. steroid burst Antibiotics as above DuoNebs q6h Defer ICS d/t PNA Incentive spirometry and flutter valve encouraged O2 prn    Acute on chronic HFpEF decompensation Lasix  20 mg twice daily Echocardiogram - pt refused this today  Strict I&O's and daily weight   AKI - improved  Monitor closely on lasix    Mild aspiration risk Reflux PPI bid  Dysphagia 3 diet  SLP following  Hypokalemia Replace as needed Monitor BMP  PAF Rate controlled hold Cardizem  due to active CHF decompensation and borderline low blood pressure metoprolol , + as needed Lopressor  for rapid rate  CT head negative for intracranial bleeding, continue Eliquis    IDDM SSI    Diabetic neuropathy gabapentin    Hypothyroidism Synthroid    Parkinson's disease Sinemet    Anxiety/depression Mentation at baseline.   SSRI   Deconditioning PT evaluation  Itching/hives appears c/w contact dermatitis  2nd gen antihistamine scheduled  Po steroids for COPD may also help this  Monitor    Class 1 obesity based on BMI: Body mass index is 31.01 kg/m.[name] Significantly low or high BMI is associated with higher medical risk.  Underweight - under 18  overweight - 25 to 29 obese - 30 or more Class 1 obesity: BMI of 30.0 to 34 Class 2 obesity: BMI of 35.0 to 39 Class 3 obesity: BMI of 40.0 to 49 Super Morbid Obesity: BMI 50-59 Super-super Morbid Obesity: BMI 60+ Healthy nutrition and physical activity advised as adjunct to other disease management and risk reduction treatments  DVT prophylaxis: Eliquis  IV fluids: no continuous IV fluids  Nutrition: cardiac/carb diet  Central lines / other devices: chronic suprapubic cath  Code Status: DNR ACP documentation  reviewed: has MOST on file in [facility] - (+)DNR and limitd interventions   TOC needs: TBD, back to SNF when stable, possible need PT/OT therre Medical barriers to dispo: tx as above. Expected medical readiness for discharge 1-2 days / pending clinical course .              Subjective / Brief ROS:  Patient reports I just feel bad today  Doesn't want to participate in therapies or move any today Denies CP Pain controlled.  Denies new weakness.  Tolerating diet.  RN reports he is intermittently refusing treatments  Family Communication: none at this time    Objective Findings:  Vitals:   09/13/23 1429 09/13/23 2018 09/14/23 0355 09/14/23 0754  BP: (!) 132/94 126/67 113/73 (!) 120/99  Pulse: 91 95 91 85  Resp: 19 18 17  (!) 24  Temp: 97.6 F (36.4 C) 98.4 F (36.9 C) 98.5 F (36.9 C) 98 F (36.7 C)  TempSrc:  Oral Oral Oral  SpO2: 96% 99% 97% 99%  Weight:      Height:        Intake/Output Summary (Last 24 hours) at 09/14/2023 1401 Last data filed at 09/14/2023 0500 Gross per 24 hour  Intake 600 ml  Output 1950 ml  Net -1350 ml   Filed Weights   09/12/23 1329  Weight: 89.8 kg    Examination:  Physical Exam Constitutional:      General: He is not in acute distress.  Cardiovascular:     Rate and Rhythm: Normal rate and regular rhythm.  Pulmonary:     Effort: Pulmonary effort is normal. No respiratory distress.     Breath sounds: Wheezing, rhonchi and rales (mild) present.  Abdominal:     Palpations: Abdomen is soft.   Musculoskeletal:     Right lower leg: Edema present.     Left lower leg: Edema present.   Skin:    General: Skin is warm and dry.     Findings: Rash (hives over chest) present.   Neurological:     Mental Status: He is alert and oriented to person, place, and time. Mental status is at baseline.   Psychiatric:        Behavior: Behavior normal.          Scheduled Medications:   acidophilus  1 capsule Oral Daily    allopurinol   200 mg Oral Daily   apixaban   5 mg Oral BID   atorvastatin   20 mg Oral QHS   [START ON 09/15/2023] bisacodyl   10 mg Rectal Q3 days   carbidopa -levodopa   1 tablet Oral TID AC   dextromethorphan -guaiFENesin   2 tablet Oral BID   docusate sodium   100 mg Oral Daily   doxycycline   100 mg Oral Q12H   fluticasone   2 spray Each Nare Daily   furosemide   20 mg Intravenous BID   gabapentin   800 mg Oral QID   insulin  aspart  0-15 Units Subcutaneous TID WC   ipratropium  2 spray Each Nare TID   ipratropium-albuterol   3 mL Nebulization Q6H   lactase  3,000 Units Oral TID WC   lactulose   30 g Oral Daily   levothyroxine   75 mcg Oral QAC breakfast   loratadine   10 mg Oral Daily   methocarbamol   1,000 mg Oral Q12H   metoprolol  tartrate  12.5 mg Oral BID   midodrine   2.5 mg Oral TID WC   oxybutynin   5 mg Oral BID   pantoprazole   40 mg Oral BID   polyethylene glycol  17 g Oral Daily   potassium chloride   40 mEq Oral Daily   predniSONE   40 mg Oral Q breakfast    Continuous Infusions:  cefTRIAXone  (ROCEPHIN )  IV 2 g (09/14/23 0859)    PRN Medications:  acetaminophen  **OR** acetaminophen , albuterol , hydrocortisone , hydrOXYzine , metoCLOPramide  (REGLAN ) injection, metoprolol  tartrate, promethazine , senna  Antimicrobials from admission:  Anti-infectives (From admission, onward)    Start     Dose/Rate Route Frequency Ordered Stop   09/14/23 2200  doxycycline  (VIBRA -TABS) tablet 100 mg        100 mg Oral Every 12 hours 09/14/23 0733     09/13/23 1000  cefTRIAXone  (ROCEPHIN ) 1 g in sodium chloride  0.9 % 100 mL IVPB  Status:  Discontinued        1 g 200 mL/hr over 30 Minutes Intravenous Every 24 hours 09/12/23 1720 09/12/23 1720   09/13/23 1000  doxycycline  (VIBRAMYCIN ) 100 mg in sodium chloride  0.9 % 250 mL IVPB        100 mg 125 mL/hr over 120 Minutes Intravenous Every 12 hours 09/12/23 1720 09/14/23 1251   09/13/23 1000  cefTRIAXone  (ROCEPHIN ) 2 g in sodium chloride  0.9 % 100 mL IVPB         2 g 200 mL/hr over 30 Minutes Intravenous Every 24 hours 09/12/23 1720     09/12/23 1715  cefTRIAXone  (ROCEPHIN ) 2 g in sodium chloride  0.9 % 100 mL IVPB        2 g 200 mL/hr over 30 Minutes Intravenous Once 09/12/23 1711 09/12/23 1824   09/12/23 1715  azithromycin  (ZITHROMAX ) 500 mg in sodium chloride  0.9 % 250 mL IVPB  Status:  Discontinued        500 mg 250 mL/hr over 60 Minutes Intravenous  Once 09/12/23 1711 09/13/23 1610           Data Reviewed:  I have personally reviewed the following...  CBC: Recent Labs  Lab 09/12/23 1345 09/13/23 0903  WBC 14.1* 10.3  NEUTROABS 11.7*  --   HGB 13.0 12.5*  HCT 39.8 37.1*  MCV 93.9 91.8  PLT 268 270   Basic Metabolic Panel: Recent Labs  Lab 09/12/23 1345 09/13/23 0903 09/14/23 0522  NA 134* 135 135  K 4.0 4.2 3.3*  CL 97* 96* 99  CO2 26 24 25   GLUCOSE 162* 231* 161*  BUN 36* 48* 49*  CREATININE 1.24 1.58* 1.16  CALCIUM  9.6 9.7 9.3   GFR: Estimated Creatinine Clearance: 55.2 mL/min (by C-G formula based on SCr of 1.16 mg/dL). Liver Function Tests: Recent Labs  Lab 09/12/23 1345  AST 20  ALT 6  ALKPHOS 113  BILITOT 1.1  PROT 7.8  ALBUMIN 3.5   No results for input(s): LIPASE, AMYLASE in the last 168 hours. No results for input(s): AMMONIA in the last 168 hours. Coagulation Profile: No results for input(s): INR, PROTIME in the last 168 hours. Cardiac Enzymes: No results for input(s): CKTOTAL, CKMB, CKMBINDEX, TROPONINI in the last 168 hours. BNP (last 3 results) No results for input(s): PROBNP in the last 8760 hours. HbA1C: No results for input(s): HGBA1C in the last 72 hours. CBG: Recent Labs  Lab 09/13/23 1147 09/13/23 1735 09/13/23 2021 09/14/23 0755 09/14/23 1147  GLUCAP 260* 198* 211* 163* 140*   Lipid Profile: No results for input(s): CHOL,  HDL, LDLCALC, TRIG, CHOLHDL, LDLDIRECT in the last 72 hours. Thyroid Function Tests: No results for input(s):  TSH, T4TOTAL, FREET4, T3FREE, THYROIDAB in the last 72 hours. Anemia Panel: No results for input(s): VITAMINB12, FOLATE, FERRITIN, TIBC, IRON, RETICCTPCT in the last 72 hours. Most Recent Urinalysis On File:     Component Value Date/Time   COLORURINE YELLOW (A) 07/03/2023 1558   APPEARANCEUR CLOUDY (A) 07/03/2023 1558   LABSPEC 1.012 07/03/2023 1558   PHURINE 7.0 07/03/2023 1558   GLUCOSEU NEGATIVE 07/03/2023 1558   HGBUR LARGE (A) 07/03/2023 1558   BILIRUBINUR NEGATIVE 07/03/2023 1558   KETONESUR NEGATIVE 07/03/2023 1558   PROTEINUR NEGATIVE 07/03/2023 1558   NITRITE NEGATIVE 07/03/2023 1558   LEUKOCYTESUR MODERATE (A) 07/03/2023 1558   Sepsis Labs: @LABRCNTIP (procalcitonin:4,lacticidven:4) Microbiology: Recent Results (from the past 240 hours)  Blood culture (routine x 2)     Status: None (Preliminary result)   Collection Time: 09/12/23  5:46 PM   Specimen: BLOOD  Result Value Ref Range Status   Specimen Description BLOOD BLOOD LEFT FOREARM  Final   Special Requests   Final    BOTTLES DRAWN AEROBIC AND ANAEROBIC Blood Culture results may not be optimal due to an inadequate volume of blood received in culture bottles   Culture   Final    NO GROWTH 2 DAYS Performed at [facility], [address] [place], [place] [address]    Report Status PENDING  Incomplete  Blood culture (routine x 2)     Status: None (Preliminary result)   Collection Time: 09/12/23  5:46 PM   Specimen: BLOOD  Result Value Ref Range Status   Specimen Description BLOOD LEFT ANTECUBITAL  Final   Special Requests   Final    BOTTLES DRAWN AEROBIC AND ANAEROBIC Blood Culture results may not be optimal due to an inadequate volume of blood received in culture bottles   Culture   Final    NO GROWTH 2 DAYS Performed at [facility], [address], [place], [place] [address]    Report Status PENDING  Incomplete      Radiology Studies last 3 days: CT ABDOMEN PELVIS  WO CONTRAST Addendum Date: 09/12/2023 ADDENDUM REPORT: 09/12/2023 16:31 ADDENDUM: Partial right lower lobe consolidation with bronchiectasis, likely a component of pulmonary scarring. Difficult to exclude recurrent infection or aspiration. Electronically Signed   By: Esmeralda Hedge M.D.   On: 09/12/2023 16:31   Result Date: 09/12/2023 CLINICAL DATA:  Unresponsive distended abdomen EXAM: CT ABDOMEN AND PELVIS WITHOUT CONTRAST TECHNIQUE: Multidetector CT imaging of the abdomen and pelvis was performed following the standard protocol without IV contrast. RADIATION DOSE REDUCTION: This exam was performed according to the departmental dose-optimization program which includes automated exposure control, adjustment of the mA and/or kV according to patient size and/or use of iterative reconstruction technique. COMPARISON:  Radiograph 05/01/2023, CT 04/29/2023, 09/29/2022, 04/16/2022 FINDINGS: Lower chest: Lung bases demonstrate partial right lower lobe consolidation with bronchiectasis. Coronary vascular calcification. Postsurgical changes at the mitral valve. Cardiomegaly. Hepatobiliary: Hepatic cyst. Small gallstone. No biliary dilatation Pancreas: Unremarkable. No pancreatic ductal dilatation or surrounding inflammatory changes. Spleen: Normal in size without focal abnormality. Adrenals/Urinary Tract: Stable adrenal glands. No hydronephrosis. Stable left renal cyst and subcentimeter renal lesions too small to further characterize, no specific imaging follow-up is recommended. Possible hyperdense exophytic lesion lower pole left kidney measuring 13 mm on series 2, image 52. Suprapubic Foley catheter Stomach/Bowel: Stomach nonenlarged. No dilated small bowel. Large stool burden throughout the colon with impacted appearing  feces at the rectum. Marked sigmoid colon air distension, chronic finding. Vascular/Lymphatic: Aortic atherosclerosis. No enlarged abdominal or pelvic lymph nodes. Reproductive: Prostate is  unremarkable. Other: Negative for pelvic effusion or free air. Musculoskeletal: Bilateral hip replacements. Multilevel degenerative changes. Postsurgical changes of the lower lumbar spine. IMPRESSION: 1. Large stool burden throughout the colon with impacted appearing feces at the rectum suggesting constipation. Marked sigmoid colon air distension, chronic finding. 2. Cholelithiasis. 3. Possible hyperdense exophytic lesion lower pole left kidney measuring 13 mm. Recommend further evaluation with nonemergent renal ultrasound. 4. Partial right lower lobe consolidation with bronchiectasis, possible pneumonia. 5. Aortic atherosclerosis. Aortic Atherosclerosis (ICD10-I70.0). Electronically Signed: By: Esmeralda Hedge M.D. On: 09/12/2023 16:24   CT HEAD WO CONTRAST ( ) Result Date: 09/12/2023 CLINICAL DATA:  Head trauma. EXAM: CT HEAD WITHOUT CONTRAST TECHNIQUE: Contiguous axial images were obtained from the base of the skull through the vertex without intravenous contrast. RADIATION DOSE REDUCTION: This exam was performed according to the departmental dose-optimization program which includes automated exposure control, adjustment of the mA and/or kV according to patient size and/or use of iterative reconstruction technique. COMPARISON:  CT of the head dated June 06, 2023. FINDINGS: Brain: There is generalized cerebral and cerebellar volume loss present. There are focal encephalomalacia changes within the left frontal lobe related to remote cortical infarct. There is mild to moderate periventricular white matter disease. There is no evidence of acute intracranial injury. No evidence of hemorrhage, mass or hydrocephalus. Vascular: Moderate calcifications within the carotid siphons. Skull: The calvarium is intact and unremarkable. There is basilar invagination, as before. Sinuses/Orbits: The visualized paranasal sinuses and mastoid air cells are clear. The orbits are unremarkable. Other: None. IMPRESSION: 1. No evidence  of acute intracranial injury. 2. 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 Electronically Signed   By: Maribeth Shivers M.D.   On: 09/12/2023 16:14   DG Chest Portable 1 View Result Date: 09/12/2023 CLINICAL DATA:  sob EXAM: PORTABLE CHEST - 1 VIEW COMPARISON:  July 03, 2023 FINDINGS: Low lung volumes. No focal airspace consolidation, pleural effusion, or pneumothorax. Unchanged cardiomegaly. Mitral valve clips. Tortuous aorta with aortic atherosclerosis. no acute fracture or destructive lesion. Osteopenia. Multilevel thoracic osteophytosis. Diffuse gaseous distension of the transverse colon. IMPRESSION: 1. Cardiomegaly with low lung volumes. Bilateral perihilar interstitial opacities, likely bronchovascular crowding from low lung volumes. Alternatively, developing interstitial edema could have this appearance. 2. Gaseous distention of the transverse colon. Electronically Signed   By: Rance Burrows M.D.   On: 09/12/2023 15:26         Shaelin Lalley, DO [facility] 09/14/2023, 2:01 PM    Dictation software may have been used to generate the above note. Typos may occur and escape review in typed/dictated notes. Please contact Dr Authur Leghorn directly for clarity if needed.  Staff may message me via secure chat in Epic  but this may not receive an immediate response,  please page me for urgent matters!  If 7PM-7AM, please contact night coverage [email or web address]

## 2023-09-14 NOTE — Plan of Care (Signed)

## 2023-09-15 ENCOUNTER — Inpatient Hospital Stay
Admit: 2023-09-15 | Discharge: 2023-09-15 | Disposition: A | Discharge status: Home / Self Care | Attending: Internal Medicine | Admitting: Internal Medicine

## 2023-09-15 LAB — BASIC METABOLIC PANEL WITH GFR
Anion gap: 9 (ref 5–15)
BUN: 44 mg/dL — ABNORMAL HIGH (ref 8–23)
CO2: 28 mmol/L (ref 22–32)
Calcium: 9.5 mg/dL (ref 8.9–10.3)
Chloride: 102 mmol/L (ref 98–111)
Creatinine, Ser: 1 mg/dL (ref 0.61–1.24)
GFR, Estimated: >60 mL/min (ref >60)
Glucose, Bld: 143 mg/dL — ABNORMAL HIGH (ref 70–99)
Potassium: 3.8 mmol/L (ref 3.5–5.1)
Sodium: 139 mmol/L (ref 135–145)

## 2023-09-15 LAB — GLUCOSE, CAPILLARY
Glucose-Capillary: 125 mg/dL — ABNORMAL HIGH (ref 70–99)
Glucose-Capillary: 131 mg/dL — ABNORMAL HIGH (ref 70–99)
Glucose-Capillary: 228 mg/dL — ABNORMAL HIGH (ref 70–99)
Glucose-Capillary: 140 mg/dL — ABNORMAL HIGH (ref 70–99)

## 2023-09-15 LAB — ECHOCARDIOGRAM COMPLETE
Height: 67 in
S' Lateral: 3.1 cm
Weight: 3168 [oz_av]

## 2023-09-15 MED ORDER — FUROSEMIDE 40 MG PO TABS
40.0000 mg | ORAL_TABLET | Freq: Two times a day (BID) | ORAL | Status: DC
  Administered 2023-09-16 – 2023-09-17 (×3): 40 mg via ORAL
  Filled 2023-09-15 (×3): qty 1

## 2023-09-15 NOTE — TOC Progression Note (Signed)
 Transition of Care [facility]) - Progression Note    Patient Details  Name: Raymond Hays MRN: [id number] Date of Birth: 07/12/1945  Transition of Care [facility]) CM/SW Contact  Loman Risk, RN Phone Number: 09/15/2023, 12:25 PM  Clinical Narrative:     Per Abran Abrahams patient will require [facility] auth.  Message sent to MD to determine if patient is medically appropriate for auth to be initiated         Expected Discharge Plan and Services                                               Social Determinants of Health (SDOH) Interventions SDOH Screenings   Food Insecurity: No Food Insecurity (09/12/2023)  Housing: Low Risk  (09/12/2023)  Transportation Needs: No Transportation Needs (09/12/2023)  Utilities: Not At Risk (09/12/2023)  Depression (PHQ2-9): Low Risk  (09/25/2022)  Financial Resource Strain: Low Risk  (05/02/2023)  Social Connections: Unknown (09/12/2023)  Recent Concern: Social Connections - Socially Isolated (07/04/2023)  Tobacco Use: Medium Risk (09/12/2023)    Readmission Risk Interventions    07/04/2023   11:21 AM 03/12/2023    2:19 PM 01/12/2021    1:08 PM  Readmission Risk Prevention Plan  Transportation Screening Complete  Complete  PCP or Specialist Appt within 3-5 Days     Social Work Consult for Recovery Care Planning/Counseling     Palliative Care Screening     Medication Review Oceanographer) Complete  Complete  PCP or Specialist appointment within 3-5 days of discharge Complete  Complete  HRI or Home Care Consult Not Complete  Complete  HRI or Home Care Consult Pt Refusal Comments SNF resident    SW Recovery Care/Counseling Consult Complete  Complete  Palliative Care Screening Not Applicable  Not Applicable  Skilled Nursing Facility Complete  Complete     Information is confidential and restricted. Go to Review Flowsheets to unlock data.

## 2023-09-15 NOTE — Care Management Important Message (Signed)
 Important Message  Patient Details  Name: Raymond Hays MRN: [id number] Date of Birth: September 17, 1945   Important Message Given:  Yes - Medicare IM     Anise Kerns 09/15/2023, 11:59 AM

## 2023-09-15 NOTE — Evaluation (Signed)
 Physical Therapy Evaluation Patient Details Name: Raymond Hays MRN: [id number] DOB: 27-Jan-1946 Today's Date: 09/15/2023  History of Present Illness  Pt is a 78 y/o male transported to the ED on 09/12/2023 due to syncopal episode, obtunded status, with a blood pressure of 103/67 and a heart rate 65. PMH significant for COPD on 2L O2, HTN, HLD, dCHF, suprapubic Foley catheter, neurogenic bladder, A-fib on Eliquis , ESBL UTI, spinal stenosis, PD, HTN, DM, hypothyroidism, gout, depression with anxiety, and anemia  Clinical Impression  Prior to recent medical concerns, pt reports having assistance with ADLs and reports being wheelchair bound ( does not self-propel); lives at long term care SNF [facility]).  Currently pt is 2+ mod A for bed mobility and can maintain seated balance EOB with 1x intermittent mod assist to maintain CoG over BOS. PT provided several perturbations (fwd, lateral, bwds) which pt was able to resist external PT force to remain upright. Pt is able to reach outside his BOS while seated EOB, during simulated boxing task (pt hitting a target in front of him). This is indicative of the ability to safely sit and weight shift in a w/c. Despite listed impairments, pt is currently at his baseline function per pt report. Due to current functional status PT is not picking up pt for further treatment at this time. Re-consultation  will be provided if needs arise. Upon hospital discharge, no further PT needs anticipated.       If plan is discharge home, recommend the following: A lot of help with walking and/or transfers;Two people to help with walking and/or transfers;Two people to help with bathing/dressing/bathroom;Direct supervision/assist for medications management;Help with stairs or ramp for entrance;Assist for transportation   Can travel by private vehicle        Equipment Recommendations None recommended by PT  Recommendations for Other Services       Functional Status Assessment  Patient has not had a recent decline in their functional status     Precautions / Restrictions Precautions Precautions: Fall Recall of Precautions/Restrictions: Impaired Restrictions Weight Bearing Restrictions Per Provider Order: No      Mobility  Bed Mobility Overal bed mobility: Needs Assistance Bed Mobility: Supine to Sit, Rolling Rolling: Mod assist, +2 for physical assistance (assist provided at trunk and hips to complete transfer)   Supine to sit: Mod assist, +2 for physical assistance (assist provided at trunk and hips to complete transfer) Sit to supine: Mod assist, +2 for physical assistance        Transfers                        Ambulation/Gait                  Stairs            Wheelchair Mobility     Tilt Bed    Modified Rankin (Stroke Patients Only)       Balance Overall balance assessment: Needs assistance Sitting-balance support: Single extremity supported Sitting balance-Leahy Scale: Fair Sitting balance - Comments: 1x intermittent mod assist to maintain CoG over BOS. PT provided perturbations (fwd, lateral, bwds) which pt was able to overcome and remain upright. Pt was able to reach outside his BOS while seated EOB, during simulated boxing task (pt hitting a target in front of him). Postural control: Right lateral lean  Pertinent Vitals/Pain Pain Assessment Pain Assessment: No/denies pain    Home Living Family/patient expects to be discharged to:: Skilled nursing facility                   Additional Comments: Pt is resident at [facility].    Prior Function Prior Level of Function : Needs assist       Physical Assist : Mobility (physical);ADLs (physical) Mobility (physical): Bed mobility;Transfers   Mobility Comments: Pt reports staff at facility transfers him using mechanical STS lift from bed to wc. Non-ambulatory       Extremity/Trunk  Assessment   Upper Extremity Assessment Upper Extremity Assessment: Defer to OT evaluation    Lower Extremity Assessment Lower Extremity Assessment: Generalized weakness;LLE deficits/detail;RLE deficits/detail RLE Deficits / Details: Hip flexion: 2+/5, Knee Flexion: 2/5, DF: 3/5 PF: 2+/5 LLE Deficits / Details: Atrophied compared to right, pt reported hx of surgery causing the atrophy, Hip flexion: 2-/5, Knee Flexion: 2-/5, DF: 2+/5 PF: 1/5    Cervical / Trunk Assessment Cervical / Trunk Assessment: Back Surgery  Communication   Communication Communication: No apparent difficulties    Cognition Arousal: Alert     PT - Cognitive impairments: Safety/Judgement                       PT - Cognition Comments: Pt reports having a foggy mind and impaired ability for recall. Following commands: Intact       Cueing Cueing Techniques: Gestural cues, Tactile cues, Verbal cues     General Comments General comments (skin integrity, edema, etc.): Pre session HR: 80 bpm, SpO2:95+ % on room air. Post treatment HR: 85 bpm, SpO2:96+ % on room air.    Exercises     Assessment/Plan    PT Assessment Patient does not need any further PT services  PT Problem List Decreased strength;Decreased range of motion;Decreased mobility;Decreased activity tolerance       PT Treatment Interventions      PT Goals (Current goals can be found in the Care Plan section)  Acute Rehab PT Goals Patient Stated Goal: Go home PT Goal Formulation: With patient Time For Goal Achievement: 09/29/23 Potential to Achieve Goals: Good    Frequency       Co-evaluation               AM-PAC PT 6 Clicks Mobility  Outcome Measure Help needed turning from your back to your side while in a flat bed without using bedrails?: A Lot Help needed moving from lying on your back to sitting on the side of a flat bed without using bedrails?: A Lot Help needed moving to and from a bed to a chair (including a  wheelchair)?: A Lot Help needed standing up from a chair using your arms (e.g., wheelchair or bedside chair)?: Total Help needed to walk in hospital room?: Total Help needed climbing 3-5 steps with a railing? : Total 6 Click Score: 9    End of Session   Activity Tolerance: Patient tolerated treatment well Patient left: in bed;with bed alarm set;with call bell/phone within reach Nurse Communication: Mobility status PT Visit Diagnosis: Muscle weakness (generalized) (M62.81)    Time: 1610-9604 PT Time Calculation (min) (ACUTE ONLY): 27 min   Charges:                Jayce Kainz, SPT 09/15/23, 1:30 PM

## 2023-09-15 NOTE — Progress Notes (Signed)
*  PRELIMINARY RESULTS* Echocardiogram 2D Echocardiogram has been performed.  Raymond Hays 09/15/2023, 7:46 AM

## 2023-09-15 NOTE — Progress Notes (Signed)
 PROGRESS NOTE    Raymond Hays   ZOX:[id number] DOB: Sep 27, 1945  DOA: 09/12/2023 Date of Service: 09/15/23 which is hospital day 3  PCP: Rogenia Clause, MD    Hospital course / significant events:   HPI: Raymond Hays is a 78 y.o. male with medical history significant of COPD, chronic hypoxic respiratory failure on 2 L as needed, HTN, chronic HFpEF, severe mitral regurgitation status post MitraClip, chronic suprapubic Foley catheter, neurogenic bladder, PAF on Eliquis , wheelchair-bound, Parkinson's disease, HTN, IDDM, diabetic neuropathy. Resides at long term care SNF [facility] Presented to ED 09/12/2023 - ENS was called for pt unresponsive to staff, EMS performed sternal rub and pt was found responsive. Brought to ED w/ concern for syncopal episode.   Patient reporting a productive cough x1 week, with intermittent yellowish sputum production, wheezing x2 to 3 days despite using more inhaler medications.  Increasingly SOB at rest.  This morning, while at breakfast table, patient  blanked out, slumped over, no fall.   06/13: to ED as above. Imaging (+)pneumonia. Pt also disimpacted and improved abd distension. WBC 14.1, initial elevated RR but then normal, not consistently meeting SIRS/sepsis. Admitted to hospitalist for syncope / pneumonia / CHF exacerbation. Abx, steroids, nebs, diuresis. Lactate increased then trending down again overnight.  06/14: pt intermittently refusing neb treatments, other therapies. Lung sounds (+)rhonchi/wheezing diffusely but stable on room air. Echo pending  06/15: good uop through yesterday/overnight. Was refusing PT/OT, echo and some meds today. Slight low K, improved Cr. Increased neb breathing tx, he is not using IS/flutter valve as directed.  06/16: pt reports improvement today, still some rales/edema but improved. Await echo read. Transition to po diuretics.      Consultants:  none  Procedures/Surgeries: none      ASSESSMENT & PLAN:    Syncope versus acute metabolic encephalopathy Multifactorial, DDx pneumonia and COPD exacerbation, CHF/HFpEF exacerbation, potential cardiac arrhythmia  Telemonitoring - ok to dc  orthostatic vital signs Echocardiogram - pending read    RLL PNA, bacterial Continue ceftriaxone    Doxycycline  due to EKG showing prolonged QTc no azithro   Acute COPD exacerbation P.o. steroid burst x5 days total  Antibiotics as above DuoNebs q6h Defer ICS d/t PNA Incentive spirometry and flutter valve encouraged O2 prn    Acute on chronic HFpEF decompensation Lasix  20 mg twice daily iv --> 40 mg po bid  Echocardiogram - read pending  Strict I&O's and daily weight   AKI - resolved   Monitor closely on lasix    Mild aspiration risk Reflux PPI bid  Dysphagia 3 diet  SLP following  Hypokalemia Replace as needed Monitor BMP  PAF Rate controlled Hold/dc Cardizem  due to active CHF decompensation and borderline low blood pressure metoprolol , + as needed Lopressor  for rapid rate  CT head negative for intracranial bleeding, continue Eliquis    IDDM SSI    Diabetic neuropathy gabapentin    Hypothyroidism Synthroid    Parkinson's disease Sinemet    Anxiety/depression Mentation at baseline.   SSRI   Deconditioning PT evaluation  Itching/hives appears c/w contact dermatitis  2nd gen antihistamine scheduled  Po steroids for COPD may also help this  Monitor    Class 1 obesity based on BMI: Body mass index is 31.01 kg/m.Raymond Hays Significantly low or high BMI is associated with higher medical risk.  Underweight - under 18  overweight - 25 to 29 obese - 30 or more Class 1 obesity: BMI of 30.0 to 34 Class 2 obesity: BMI of 35.0 to 39 Class 3 obesity:  BMI of 40.0 to 49 Super Morbid Obesity: BMI 50-59 Super-super Morbid Obesity: BMI 60+ Healthy nutrition and physical activity advised as adjunct to other disease management and risk reduction treatments    DVT prophylaxis: Eliquis  IV  fluids: no continuous IV fluids  Nutrition: cardiac/carb diet  Central lines / other devices: chronic suprapubic cath  Code Status: DNR ACP documentation reviewed: has MOST on file in [facility] - (+)DNR and limitd interventions   TOC needs: TBD, back to SNF when stable, possible need PT/OT therre Medical barriers to dispo: tx as above. Expected medical readiness for discharge 1-2 days / pending clinical course .              Subjective / Brief ROS:  Patient reports feeling much better today  Denies CP/SOB Reports cough is persisting  Pain controlled.  Denies new weakness.  Tolerating diet.  RN reports he is intermittently refusing treatments  Family Communication: none at this time    Objective Findings:  Vitals:   09/15/23 0345 09/15/23 0754 09/15/23 0800 09/15/23 1304  BP: (!) 137/92 (!) 133/91 (!) 133/91   Pulse: 84 100 100   Resp: 18 17 19    Temp: 97.8 F (36.6 C) 97.9 F (36.6 C) 97.7 F (36.5 C)   TempSrc:  Oral Oral   SpO2: 96% 92% 92% 93%  Weight:      Height:        Intake/Output Summary (Last 24 hours) at 09/15/2023 1425 Last data filed at 09/15/2023 0930 Gross per 24 hour  Intake 303.89 ml  Output 1900 ml  Net -1596.11 ml   Filed Weights   09/12/23 1329  Weight: 89.8 kg    Examination:  Physical Exam Constitutional:      General: He is not in acute distress.  Cardiovascular:     Rate and Rhythm: Normal rate and regular rhythm.  Pulmonary:     Effort: Pulmonary effort is normal. No respiratory distress.     Breath sounds: Wheezing, rhonchi and rales (mild/improved today) present.  Abdominal:     Palpations: Abdomen is soft.   Musculoskeletal:     Right lower leg: Edema present.     Left lower leg: Edema present.   Skin:    General: Skin is warm and dry.     Findings: Rash (hives over chest) present.   Neurological:     Mental Status: He is alert and oriented to person, place, and time. Mental status is at baseline.    Psychiatric:        Behavior: Behavior normal.          Scheduled Medications:   acidophilus  1 capsule Oral Daily   allopurinol   200 mg Oral Daily   apixaban   5 mg Oral BID   atorvastatin   20 mg Oral QHS   bisacodyl   10 mg Rectal Q3 days   carbidopa -levodopa   1 tablet Oral TID AC   dextromethorphan -guaiFENesin   2 tablet Oral BID   docusate sodium   100 mg Oral Daily   doxycycline   100 mg Oral Q12H   fluticasone   2 spray Each Nare Daily   [START ON 09/16/2023] furosemide   40 mg Oral BID   gabapentin   800 mg Oral QID   insulin  aspart  0-15 Units Subcutaneous TID WC   ipratropium  2 spray Each Nare TID   ipratropium-albuterol   3 mL Nebulization Q6H   lactase  3,000 Units Oral TID WC   lactulose   30 g Oral Daily   levothyroxine   75 mcg Oral QAC breakfast   loratadine   10 mg Oral Daily   methocarbamol   1,000 mg Oral Q12H   metoprolol  tartrate  12.5 mg Oral BID   midodrine   2.5 mg Oral TID WC   oxybutynin   5 mg Oral BID   pantoprazole   40 mg Oral BID   polyethylene glycol  17 g Oral Daily   potassium chloride   40 mEq Oral Daily   predniSONE   40 mg Oral Q breakfast   traZODone   100 mg Oral QHS    Continuous Infusions:  cefTRIAXone  (ROCEPHIN )  IV 2 g (09/15/23 0857)    PRN Medications:  acetaminophen  **OR** acetaminophen , albuterol , hydrocortisone , hydrOXYzine , metoCLOPramide  (REGLAN ) injection, metoprolol  tartrate, promethazine , senna  Antimicrobials from admission:  Anti-infectives (From admission, onward)    Start     Dose/Rate Route Frequency Ordered Stop   09/14/23 2200  doxycycline  (VIBRA -TABS) tablet 100 mg        100 mg Oral Every 12 hours 09/14/23 0733     09/13/23 1000  cefTRIAXone  (ROCEPHIN ) 1 g in sodium chloride  0.9 % 100 mL IVPB  Status:  Discontinued        1 g 200 mL/hr over 30 Minutes Intravenous Every 24 hours 09/12/23 1720 09/12/23 1720   09/13/23 1000  doxycycline  (VIBRAMYCIN ) 100 mg in sodium chloride  0.9 % 250 mL IVPB        100 mg 125 mL/hr  over 120 Minutes Intravenous Every 12 hours 09/12/23 1720 09/14/23 1253   09/13/23 1000  cefTRIAXone  (ROCEPHIN ) 2 g in sodium chloride  0.9 % 100 mL IVPB        2 g 200 mL/hr over 30 Minutes Intravenous Every 24 hours 09/12/23 1720     09/12/23 1715  cefTRIAXone  (ROCEPHIN ) 2 g in sodium chloride  0.9 % 100 mL IVPB        2 g 200 mL/hr over 30 Minutes Intravenous Once 09/12/23 1711 09/12/23 1824   09/12/23 1715  azithromycin  (ZITHROMAX ) 500 mg in sodium chloride  0.9 % 250 mL IVPB  Status:  Discontinued        500 mg 250 mL/hr over 60 Minutes Intravenous  Once 09/12/23 1711 09/13/23 0454           Data Reviewed:  I have personally reviewed the following...  CBC: Recent Labs  Lab 09/12/23 1345 09/13/23 0903  WBC 14.1* 10.3  NEUTROABS 11.7*  --   HGB 13.0 12.5*  HCT 39.8 37.1*  MCV 93.9 91.8  PLT 268 270   Basic Metabolic Panel: Recent Labs  Lab 09/12/23 1345 09/13/23 0903 09/14/23 0522 09/15/23 0347  NA 134* 135 135 139  K 4.0 4.2 3.3* 3.8  CL 97* 96* 99 102  CO2 26 24 25 28   GLUCOSE 162* 231* 161* 143*  BUN 36* 48* 49* 44*  CREATININE 1.24 1.58* 1.16 1.00  CALCIUM  9.6 9.7 9.3 9.5   GFR: Estimated Creatinine Clearance: 64.1 mL/min (by C-G formula based on SCr of 1 mg/dL). Liver Function Tests: Recent Labs  Lab 09/12/23 1345  AST 20  ALT 6  ALKPHOS 113  BILITOT 1.1  PROT 7.8  ALBUMIN 3.5   No results for input(s): LIPASE, AMYLASE in the last 168 hours. No results for input(s): AMMONIA in the last 168 hours. Coagulation Profile: No results for input(s): INR, PROTIME in the last 168 hours. Cardiac Enzymes: No results for input(s): CKTOTAL, CKMB, CKMBINDEX, TROPONINI in the last 168 hours. BNP (last 3 results) No results for input(s): PROBNP in the last  8760 hours. HbA1C: No results for input(s): HGBA1C in the last 72 hours. CBG: Recent Labs  Lab 09/14/23 1147 09/14/23 1551 09/14/23 2140 09/15/23 0755 09/15/23 1206  GLUCAP  140* 157* 195* 125* 131*   Lipid Profile: No results for input(s): CHOL, HDL, LDLCALC, TRIG, CHOLHDL, LDLDIRECT in the last 72 hours. Thyroid Function Tests: No results for input(s): TSH, T4TOTAL, FREET4, T3FREE, THYROIDAB in the last 72 hours. Anemia Panel: No results for input(s): VITAMINB12, FOLATE, FERRITIN, TIBC, IRON, RETICCTPCT in the last 72 hours. Most Recent Urinalysis On File:     Component Value Date/Time   COLORURINE YELLOW (A) 07/03/2023 1558   APPEARANCEUR CLOUDY (A) 07/03/2023 1558   LABSPEC 1.012 07/03/2023 1558   PHURINE 7.0 07/03/2023 1558   GLUCOSEU NEGATIVE 07/03/2023 1558   HGBUR LARGE (A) 07/03/2023 1558   BILIRUBINUR NEGATIVE 07/03/2023 1558   KETONESUR NEGATIVE 07/03/2023 1558   PROTEINUR NEGATIVE 07/03/2023 1558   NITRITE NEGATIVE 07/03/2023 1558   LEUKOCYTESUR MODERATE (A) 07/03/2023 1558   Sepsis Labs: @LABRCNTIP (procalcitonin:4,lacticidven:4) Microbiology: Recent Results (from the past 240 hours)  Blood culture (routine x 2)     Status: None (Preliminary result)   Collection Time: 09/12/23  5:46 PM   Specimen: BLOOD  Result Value Ref Range Status   Specimen Description BLOOD BLOOD LEFT FOREARM  Final   Special Requests   Final    BOTTLES DRAWN AEROBIC AND ANAEROBIC Blood Culture results may not be optimal due to an inadequate volume of blood received in culture bottles   Culture   Final    NO GROWTH 3 DAYS Performed at [facility], [address] [place], [place] [address]    Report Status PENDING  Incomplete  Blood culture (routine x 2)     Status: None (Preliminary result)   Collection Time: 09/12/23  5:46 PM   Specimen: BLOOD  Result Value Ref Range Status   Specimen Description BLOOD LEFT ANTECUBITAL  Final   Special Requests   Final    BOTTLES DRAWN AEROBIC AND ANAEROBIC Blood Culture results may not be optimal due to an inadequate volume of blood received in culture bottles   Culture   Final     NO GROWTH 3 DAYS Performed at [facility], [address] [place], [place] [address]    Report Status PENDING  Incomplete      Radiology Studies last 3 days: CT ABDOMEN PELVIS WO CONTRAST Addendum Date: 09/12/2023 ADDENDUM REPORT: 09/12/2023 16:31 ADDENDUM: Partial right lower lobe consolidation with bronchiectasis, likely a component of pulmonary scarring. Difficult to exclude recurrent infection or aspiration. Electronically Signed   By: Esmeralda Hedge M.D.   On: 09/12/2023 16:31   Result Date: 09/12/2023 CLINICAL DATA:  Unresponsive distended abdomen EXAM: CT ABDOMEN AND PELVIS WITHOUT CONTRAST TECHNIQUE: Multidetector CT imaging of the abdomen and pelvis was performed following the standard protocol without IV contrast. RADIATION DOSE REDUCTION: This exam was performed according to the departmental dose-optimization program which includes automated exposure control, adjustment of the mA and/or kV according to patient size and/or use of iterative reconstruction technique. COMPARISON:  Radiograph 05/01/2023, CT 04/29/2023, 09/29/2022, 04/16/2022 FINDINGS: Lower chest: Lung bases demonstrate partial right lower lobe consolidation with bronchiectasis. Coronary vascular calcification. Postsurgical changes at the mitral valve. Cardiomegaly. Hepatobiliary: Hepatic cyst. Small gallstone. No biliary dilatation Pancreas: Unremarkable. No pancreatic ductal dilatation or surrounding inflammatory changes. Spleen: Normal in size without focal abnormality. Adrenals/Urinary Tract: Stable adrenal glands. No hydronephrosis. Stable left renal cyst and subcentimeter renal lesions too small  to further characterize, no specific imaging follow-up is recommended. Possible hyperdense exophytic lesion lower pole left kidney measuring 13 mm on series 2, image 52. Suprapubic Foley catheter Stomach/Bowel: Stomach nonenlarged. No dilated small bowel. Large stool burden throughout the colon with impacted appearing  feces at the rectum. Marked sigmoid colon air distension, chronic finding. Vascular/Lymphatic: Aortic atherosclerosis. No enlarged abdominal or pelvic lymph nodes. Reproductive: Prostate is unremarkable. Other: Negative for pelvic effusion or free air. Musculoskeletal: Bilateral hip replacements. Multilevel degenerative changes. Postsurgical changes of the lower lumbar spine. IMPRESSION: 1. Large stool burden throughout the colon with impacted appearing feces at the rectum suggesting constipation. Marked sigmoid colon air distension, chronic finding. 2. Cholelithiasis. 3. Possible hyperdense exophytic lesion lower pole left kidney measuring 13 mm. Recommend further evaluation with nonemergent renal ultrasound. 4. Partial right lower lobe consolidation with bronchiectasis, possible pneumonia. 5. Aortic atherosclerosis. Aortic Atherosclerosis (ICD10-I70.0). Electronically Signed: By: Esmeralda Hedge M.D. On: 09/12/2023 16:24   CT HEAD WO CONTRAST ( ) Result Date: 09/12/2023 CLINICAL DATA:  Head trauma. EXAM: CT HEAD WITHOUT CONTRAST TECHNIQUE: Contiguous axial images were obtained from the base of the skull through the vertex without intravenous contrast. RADIATION DOSE REDUCTION: This exam was performed according to the departmental dose-optimization program which includes automated exposure control, adjustment of the mA and/or kV according to patient size and/or use of iterative reconstruction technique. COMPARISON:  CT of the head dated June 06, 2023. FINDINGS: Brain: There is generalized cerebral and cerebellar volume loss present. There are focal encephalomalacia changes within the left frontal lobe related to remote cortical infarct. There is mild to moderate periventricular white matter disease. There is no evidence of acute intracranial injury. No evidence of hemorrhage, mass or hydrocephalus. Vascular: Moderate calcifications within the carotid siphons. Skull: The calvarium is intact and unremarkable.  There is basilar invagination, as before. Sinuses/Orbits: The visualized paranasal sinuses and mastoid air cells are clear. The orbits are unremarkable. Other: None. IMPRESSION: 1. No evidence of acute intracranial injury. 2. 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 Electronically Signed   By: Maribeth Shivers M.D.   On: 09/12/2023 16:14   DG Chest Portable 1 View Result Date: 09/12/2023 CLINICAL DATA:  sob EXAM: PORTABLE CHEST - 1 VIEW COMPARISON:  July 03, 2023 FINDINGS: Low lung volumes. No focal airspace consolidation, pleural effusion, or pneumothorax. Unchanged cardiomegaly. Mitral valve clips. Tortuous aorta with aortic atherosclerosis. no acute fracture or destructive lesion. Osteopenia. Multilevel thoracic osteophytosis. Diffuse gaseous distension of the transverse colon. IMPRESSION: 1. Cardiomegaly with low lung volumes. Bilateral perihilar interstitial opacities, likely bronchovascular crowding from low lung volumes. Alternatively, developing interstitial edema could have this appearance. 2. Gaseous distention of the transverse colon. Electronically Signed   By: Rance Burrows M.D.   On: 09/12/2023 15:26         Melodi Sprung, DO [facility] 09/15/2023, 2:25 PM    Dictation software may have been used to generate the above note. Typos may occur and escape review in typed/dictated notes. Please contact Dr Authur Leghorn directly for clarity if needed.  Staff may message me via secure chat in Epic  but this may not receive an immediate response,  please page me for urgent matters!  If 7PM-7AM, please contact night coverage [email or web address]

## 2023-09-15 NOTE — TOC Initial Note (Signed)
 Transition of Care [facility]) - Initial/Assessment Note    Patient Details  Name: Raymond Hays MRN: [id number] Date of Birth: 27-Nov-1945  Transition of Care [facility]) CM/SW Contact:    Loman Risk, RN Phone Number: 09/15/2023, 3:55 PM  Clinical Narrative:                  Patient admitted from LTC at [facility] Patient confirms plan to return at discharge "[facility]" and clinical documentation faxed to [phone] in order to Obtain auth        Patient Goals and CMS Choice            Expected Discharge Plan and Services                                              Prior Living Arrangements/Services                       Activities of Daily Living   ADL Screening (condition at time of admission) Independently performs ADLs?: No Is the patient deaf or have difficulty hearing?: No Does the patient have difficulty seeing, even when wearing glasses/contacts?: No Does the patient have difficulty concentrating, remembering, or making decisions?: No  Permission Sought/Granted                  Emotional Assessment              Admission diagnosis:  Syncope [R55] Unresponsive episode [R40.4] Patient Active Problem List   Diagnosis Date Noted   Syncope 09/12/2023   Severe sepsis without septic shock (HCC) 07/04/2023   RSV (respiratory syncytial virus pneumonia) 07/03/2023   Advance care planning 05/07/2023   PNA (pneumonia) 04/30/2023   COPD exacerbation (HCC) 04/29/2023   Acute on chronic diastolic CHF (congestive heart failure) (HCC) 04/29/2023   Acute on chronic respiratory failure with hypoxia (HCC) 04/29/2023   HLD (hyperlipidemia) 04/29/2023   Abdominal distension 04/29/2023   Acute metabolic encephalopathy 04/29/2023   Pneumonia of right lower lobe due to infectious organism 03/12/2023   Ambulatory dysfunction 03/12/2023   Dehydration 12/07/2022   Paroxysmal atrial fibrillation with rapid  ventricular response (HCC) 12/07/2022   Constipation 12/07/2022   Thoracic spondylosis with cord compression (T10) 09/25/2022   Hx of thoracic laminectomy (T11-12) 09/25/2022   Metabolic acidosis 09/15/2022   Aspiration pneumonia of right lower lobe (HCC) 09/15/2022   Complicated UTI (urinary tract infection) 09/14/2022   Hypothyroidism 09/14/2022   Depression with anxiety 09/14/2022   Obesity (BMI 30-39.9) 09/14/2022   Prolonged QT interval 09/14/2022   Suprapubic catheter (HCC) 09/14/2022   Abnormal MRI, thoracic spine (08/23/2022) 09/03/2022   DDD (degenerative disc disease), cervical 08/05/2022   Neurogenic incontinence 08/05/2022   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) 08/05/2022   Foraminal stenosis of thoracic region 08/05/2022   Thoracic Facet Arthropathy 08/05/2022   Lower extremity numbness (Bilateral) 08/05/2022   Weakness of lower extremity (Bilateral) 08/05/2022   Chronic midline low back pain with left-sided sciatica 08/05/2022   Left foot drop 08/05/2022   Lumbar postlaminectomy syndrome 08/05/2022   Poor memory 08/05/2022   Vitamin B12 deficiency 07/25/2022   Pharmacologic therapy 07/24/2022   Disorder of skeletal system 07/24/2022   Problems influencing health status 07/24/2022   Elevated sed rate 07/24/2022   Elevated C-reactive protein (CRP) 07/24/2022   Chronic  anticoagulation (ELIQUIS ) 07/24/2022   Chronic neck and back pain (1ry area of Pain) (Bilateral) (L>R) 07/24/2022   Abnormal MRI, cervical spine (08/23/2022) 07/24/2022   History of cervical spinal surgery 07/24/2022   History of lumbar surgery 07/24/2022   History of thoracic surgery 07/24/2022   Laryngopharyngeal reflux 06/17/2022   Throat clearing 06/17/2022   Acute respiratory failure (HCC) 04/03/2021   E. 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/27/2020   Chronic respiratory failure with hypoxia (HCC) 01/10/2020   Impaired functional mobility, balance, gait, and endurance 09/15/2019   Catheter cystitis (HCC) 10/16/2018   Urinary retention 07/14/2018   COVID-19 virus detected 07/03/2018   Benign prostatic hyperplasia with lower urinary tract symptoms 08/06/2017   Osteoarthritis of right knee 06/19/2017   S/P orthopedic surgery, follow-up exam 10/25/2016   Tendon rupture of wrist, sequela 01/02/2016   Rotator cuff tear arthropathy of right shoulder 09/28/2015   Acute pain of right knee 08/03/2015   Chronic knee pain (Right) 08/03/2015   Calcific tendinitis of left shoulder 07/05/2015   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/05/2015   Primary osteoarthritis of left shoulder 07/05/2015   Gait difficulty 06/26/2015   Severe mitral regurgitation 06/05/2015   AF (paroxysmal atrial fibrillation) (HCC) 05/31/2015   Chronic pain syndrome 05/31/2015   COPD with acute exacerbation (HCC) 05/31/2015   Essential hypertension 05/31/2015   Atrial fibrillation (HCC) 05/31/2015   Chronic wrist pain (Right) 04/24/2015   Arthritis of left wrist 04/24/2015   Rupture of extensor tendon of left hand 04/24/2015   PCP:  Rogenia Clause, MD Pharmacy:   [facility] - [place], [place] - [address] [place] [address] Phone: [phone] Fax: [phone]  [facility] - [place], [place] - [address] [place] [address] Phone: [phone] Fax: [phone]     Social Drivers of Health (SDOH) Social History: SDOH Screenings   Food Insecurity: No Food Insecurity (09/12/2023)   Housing: Low Risk  (09/12/2023)  Transportation Needs: No Transportation Needs (09/12/2023)  Utilities: Not At Risk (09/12/2023)  Depression (PHQ2-9): Low Risk  (09/25/2022)  Financial Resource Strain: Low Risk  (05/02/2023)  Social Connections: Unknown (09/12/2023)  Recent Concern: Social Connections - Socially Isolated (07/04/2023)  Tobacco Use: Medium Risk (09/12/2023)   SDOH Interventions:     Readmission Risk Interventions    07/04/2023   11:21 AM 03/12/2023    2:19 PM 01/12/2021    1:08 PM  Readmission Risk Prevention Plan  Transportation Screening Complete  Complete  PCP or Specialist Appt within 3-5 Days     Social Work Consult for Recovery Care Planning/Counseling     Palliative Care Screening     Medication Review Oceanographer) Complete  Complete  PCP or Specialist appointment within 3-5 days of discharge Complete  Complete  HRI or Home Care Consult Not Complete  Complete  HRI or Home Care Consult Pt Refusal Comments SNF resident    SW Recovery Care/Counseling Consult Complete  Complete  Palliative Care Screening Not Applicable  Not Applicable  Skilled Nursing Facility Complete  Complete     Information is confidential and restricted. Go to Review Flowsheets to unlock data.

## 2023-09-15 NOTE — Plan of Care (Signed)
   Problem: Coping: Goal: Ability to adjust to condition or change in health will improve Outcome: Progressing   Problem: Fluid Volume: Goal: Ability to maintain a balanced intake and output will improve Outcome: Progressing

## 2023-09-16 LAB — GLUCOSE, CAPILLARY
Glucose-Capillary: 119 mg/dL — ABNORMAL HIGH (ref 70–99)
Glucose-Capillary: 132 mg/dL — ABNORMAL HIGH (ref 70–99)
Glucose-Capillary: 318 mg/dL — ABNORMAL HIGH (ref 70–99)
Glucose-Capillary: 244 mg/dL — ABNORMAL HIGH (ref 70–99)

## 2023-09-16 LAB — BASIC METABOLIC PANEL WITH GFR
Anion gap: 10 (ref 5–15)
BUN: 49 mg/dL — ABNORMAL HIGH (ref 8–23)
CO2: 26 mmol/L (ref 22–32)
Calcium: 9.2 mg/dL (ref 8.9–10.3)
Chloride: 103 mmol/L (ref 98–111)
Creatinine, Ser: 0.99 mg/dL (ref 0.61–1.24)
GFR, Estimated: >60 mL/min (ref >60)
Glucose, Bld: 146 mg/dL — ABNORMAL HIGH (ref 70–99)
Potassium: 3.8 mmol/L (ref 3.5–5.1)
Sodium: 139 mmol/L (ref 135–145)

## 2023-09-16 NOTE — NC FL2 (Signed)
 [place]  MEDICAID [id number] LEVEL OF CARE FORM     IDENTIFICATION  Patient Name: Raymond Hays Birthdate: 1945-08-18 Sex: male Admission Date (Current Location): 09/12/2023  [facility] and [place] Number:  Chiropodist and Address:  [facility], [address] [place], [place] [address]      Provider Number: [id number]  Attending Physician Name and Address:  Melodi Sprung, DO  Relative Name and Phone Number:  Clarance Cristal [phone]    Current Level of Care: Hospital Recommended Level of Care: Skilled Nursing Facility Prior Approval Number:    Date Approved/Denied:   PASRR Number: [id number]  Discharge Plan: SNF    Current Diagnoses: Patient Active Problem List   Diagnosis Date Noted   Syncope 09/12/2023   Severe sepsis without septic shock (HCC) 07/04/2023   RSV (respiratory syncytial virus pneumonia) 07/03/2023   Advance care planning 05/07/2023   PNA (pneumonia) 04/30/2023   COPD exacerbation (HCC) 04/29/2023   Acute on chronic diastolic CHF (congestive heart failure) (HCC) 04/29/2023   Acute on chronic respiratory failure with hypoxia (HCC) 04/29/2023   HLD (hyperlipidemia) 04/29/2023   Abdominal distension 04/29/2023   Acute metabolic encephalopathy 04/29/2023   Pneumonia of right lower lobe due to infectious organism 03/12/2023   Ambulatory dysfunction 03/12/2023   Dehydration 12/07/2022   Paroxysmal atrial fibrillation with rapid ventricular response (HCC) 12/07/2022   Constipation 12/07/2022   Thoracic spondylosis with cord compression (T10) 09/25/2022   Hx of thoracic laminectomy (T11-12) 09/25/2022   Metabolic acidosis 09/15/2022   Aspiration pneumonia of right lower lobe (HCC) 09/15/2022   Complicated UTI (urinary tract infection) 09/14/2022   Hypothyroidism 09/14/2022   Depression with anxiety 09/14/2022   Obesity (BMI 30-39.9) 09/14/2022   Prolonged QT interval 09/14/2022   Suprapubic catheter (HCC) 09/14/2022    Abnormal MRI, thoracic spine (08/23/2022) 09/03/2022   DDD (degenerative disc disease), cervical 08/05/2022   Neurogenic incontinence 08/05/2022   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) 08/05/2022   Foraminal stenosis of thoracic region 08/05/2022   Thoracic Facet Arthropathy 08/05/2022   Lower extremity numbness (Bilateral) 08/05/2022   Weakness of lower extremity (Bilateral) 08/05/2022   Chronic midline low back pain with left-sided sciatica 08/05/2022   Left foot drop 08/05/2022   Lumbar postlaminectomy syndrome 08/05/2022   Poor memory 08/05/2022   Vitamin B12 deficiency 07/25/2022   Pharmacologic therapy 07/24/2022   Disorder of skeletal system 07/24/2022   Problems influencing health status 07/24/2022   Elevated sed rate 07/24/2022   Elevated C-reactive protein (CRP) 07/24/2022   Chronic anticoagulation (ELIQUIS ) 07/24/2022   Chronic neck and back pain (1ry area of Pain) (Bilateral) (L>R) 07/24/2022   Abnormal MRI, cervical spine (08/23/2022) 07/24/2022   History of cervical spinal surgery 07/24/2022   History of lumbar surgery 07/24/2022   History of thoracic surgery 07/24/2022   Laryngopharyngeal reflux 06/17/2022   Throat clearing 06/17/2022   Acute respiratory failure (HCC) 04/03/2021   E. 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   Chronic respiratory failure with hypoxia (HCC) 01/10/2020  Impaired functional mobility, balance, gait, and endurance 09/15/2019   Catheter cystitis (HCC) 10/16/2018   Urinary retention 07/14/2018   COVID-19 virus detected 07/03/2018    Benign prostatic hyperplasia with lower urinary tract symptoms 08/06/2017   Osteoarthritis of right knee 06/19/2017   S/P orthopedic surgery, follow-up exam 10/25/2016   Tendon rupture of wrist, sequela 01/02/2016   Rotator cuff tear arthropathy of right shoulder 09/28/2015   Acute pain of right knee 08/03/2015   Chronic knee pain (Right) 08/03/2015   Calcific tendinitis of left shoulder 07/05/2015   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/05/2015   Primary osteoarthritis of left shoulder 07/05/2015   Gait difficulty 06/26/2015   Severe mitral regurgitation 06/05/2015   AF (paroxysmal atrial fibrillation) (HCC) 05/31/2015   Chronic pain syndrome 05/31/2015   COPD with acute exacerbation (HCC) 05/31/2015   Essential hypertension 05/31/2015   Atrial fibrillation (HCC) 05/31/2015   Chronic wrist pain (Right) 04/24/2015   Arthritis of left wrist 04/24/2015   Rupture of extensor tendon of left hand 04/24/2015    Orientation RESPIRATION BLADDER Height & Weight     Self, Time, Situation, Place    Indwelling catheter Weight: 89.8 kg Height:  5' 7 (170.2 cm)  BEHAVIORAL SYMPTOMS/MOOD NEUROLOGICAL BOWEL NUTRITION STATUS      Incontinent Diet (heart healthy carb modified)  AMBULATORY STATUS COMMUNICATION OF NEEDS Skin       PU Stage and Appropriate Care (Left buttock ulcer with lift foam dressing)                       Personal Care Assistance Level of Assistance              Functional Limitations Info             SPECIAL CARE FACTORS FREQUENCY                       Contractures      Additional Factors Info  Code Status, Allergies Code Status Info: DNR Allergies Info: NKDA           Current Medications (09/16/2023):  This is the current hospital active medication list Current Facility-Administered Medications  Medication Dose Route Frequency Provider Last Rate Last Admin   acetaminophen  (TYLENOL ) tablet 650 mg  650 mg Oral Q6H PRN Antoniette Batty T, MD        Or   acetaminophen  (TYLENOL ) suppository 650 mg  650 mg Rectal Q6H PRN Antoniette Batty T, MD       acidophilus (RISAQUAD) capsule 1 capsule  1 capsule Oral Daily Antoniette Batty T, MD   1 capsule at 09/16/23 0929   albuterol  (PROVENTIL ) (2.5 MG/3ML) 0.083% nebulizer solution 3 mL  3 mL Inhalation Q4H PRN Antoniette Batty T, MD   3 mL at 09/13/23 1021   allopurinol  (ZYLOPRIM ) tablet 200 mg  200 mg Oral Daily Antoniette Batty T, MD   200 mg at 09/16/23 1610   apixaban  (ELIQUIS ) tablet 5 mg  5 mg Oral BID Antoniette Batty T, MD   5 mg at 09/16/23 9604   atorvastatin  (LIPITOR) tablet 20 mg  20 mg Oral QHS Antoniette Batty T, MD   20 mg at 09/15/23 2232   bisacodyl  (DULCOLAX) suppository 10 mg  10 mg Rectal Q3 days [place], MD       carbidopa -levodopa  (SINEMET  IR) 25-100 MG per tablet immediate release 1 tablet  1 tablet Oral TID AC Zhang, Ping T, MD  1 tablet at 09/16/23 1309   cefTRIAXone  (ROCEPHIN ) 2 g in sodium chloride  0.9 % 100 mL IVPB  2 g Intravenous Q24H Antoniette Batty T, MD 200 mL/hr at 09/16/23 0937 2 g at 09/16/23 0454   dextromethorphan -guaiFENesin  (MUCINEX  DM) 30-600 MG per 12 hr tablet 2 tablet  2 tablet Oral BID Alexander, Natalie, DO   2 tablet at 09/16/23 0930   docusate sodium  (COLACE) capsule 100 mg  100 mg Oral Daily Zhang, Ping T, MD   100 mg at 09/14/23 0845   doxycycline  (VIBRA -TABS) tablet 100 mg  100 mg Oral Q12H Chappell, Alex B, RPH   100 mg at 09/16/23 0981   fluticasone  (FLONASE ) 50 MCG/ACT nasal spray 2 spray  2 spray Each Nare Daily Antoniette Batty T, MD   2 spray at 09/15/23 0900   furosemide  (LASIX ) tablet 40 mg  40 mg Oral BID Alexander, Natalie, DO   40 mg at 09/16/23 1914   gabapentin  (NEURONTIN ) capsule 800 mg  800 mg Oral QID Antoniette Batty T, MD   800 mg at 09/16/23 1309   hydrocortisone  (ANUSOL -HC) suppository 25 mg  25 mg Rectal Q4H PRN Antoniette Batty T, MD       hydrOXYzine  (ATARAX ) tablet 25 mg  25 mg Oral Q6H PRN Antoniette Batty T, MD       insulin  aspart (novoLOG ) injection 0-15 Units  0-15  Units Subcutaneous TID WC Antoniette Batty T, MD   3 Units at 09/16/23 1309   ipratropium (ATROVENT ) 0.06 % nasal spray 2 spray  2 spray Each Nare TID [place], MD   2 spray at 09/16/23 0932   ipratropium-albuterol  (DUONEB) 0.5-2.5 (3) MG/3ML nebulizer solution 3 mL  3 mL Nebulization Q6H Melodi Sprung, DO   3 mL at 09/16/23 0707   lactase (LACTAID) tablet 3,000 Units  3,000 Units Oral TID WC Antoniette Batty T, MD   3,000 Units at 09/16/23 1309   lactulose  (CHRONULAC ) 10 GM/15ML solution 30 g  30 g Oral Daily Antoniette Batty T, MD   30 g at 09/14/23 0848   levothyroxine  (SYNTHROID ) tablet 75 mcg  75 mcg Oral QAC breakfast Antoniette Batty T, MD   75 mcg at 09/16/23 7829   loratadine  (CLARITIN ) tablet 10 mg  10 mg Oral Daily Alexander, Natalie, DO   10 mg at 09/16/23 5621   methocarbamol  (ROBAXIN ) tablet 1,000 mg  1,000 mg Oral Q12H Antoniette Batty T, MD   1,000 mg at 09/16/23 3086   metoCLOPramide  (REGLAN ) injection 5 mg  5 mg Intravenous Q8H PRN Antoniette Batty T, MD       metoprolol  tartrate (LOPRESSOR ) injection 2.5 mg  2.5 mg Intravenous Q4H PRN Antoniette Batty T, MD       metoprolol  tartrate (LOPRESSOR ) tablet 12.5 mg  12.5 mg Oral BID Alexander, Natalie, DO   12.5 mg at 09/16/23 0930   midodrine  (PROAMATINE ) tablet 2.5 mg  2.5 mg Oral TID WC Zhang, Ping T, MD   2.5 mg at 09/16/23 1309   oxybutynin  (DITROPAN ) tablet 5 mg  5 mg Oral BID Antoniette Batty T, MD   5 mg at 09/16/23 0930   pantoprazole  (PROTONIX ) EC tablet 40 mg  40 mg Oral BID Alexander, Natalie, DO   40 mg at 09/16/23 5784   polyethylene glycol (MIRALAX  / GLYCOLAX ) packet 17 g  17 g Oral Daily Antoniette Batty T, MD   17 g at 09/13/23 0848   potassium chloride  SA (KLOR-CON  M) CR tablet 40 mEq  40 mEq  Oral Daily Alexander, Natalie, DO   40 mEq at 09/16/23 0865   predniSONE  (DELTASONE ) tablet 40 mg  40 mg Oral Q breakfast Alexander, Natalie, DO   40 mg at 09/16/23 7846   promethazine  (PHENERGAN ) tablet 12.5 mg  12.5 mg Oral Q6H PRN [place], MD       senna  [facility]) tablet 8.6 mg  1 tablet Oral Daily PRN Antoniette Batty T, MD       traZODone  (DESYREL ) tablet 100 mg  100 mg Oral QHS Alexander, Natalie, DO   100 mg at 09/15/23 2232     Discharge Medications: Please see discharge summary for a list of discharge medications.  Relevant Imaging Results:  Relevant Lab Results:   Additional Information SSN [id number]  Elsie Halo, RN

## 2023-09-16 NOTE — Progress Notes (Signed)
   09/16/23 1600  Assess: MEWS Score  Temp 98.2 F (36.8 C)  BP 107/66  MAP (mmHg) 79  Pulse Rate (!) 112  Resp 16  SpO2 98 %  O2 Device Room Air  Assess: MEWS Score  MEWS Temp 0  MEWS Systolic 0  MEWS Pulse 2  MEWS RR 0  MEWS LOC 0  MEWS Score 2  MEWS Score Color Yellow  Assess: if the MEWS score is Yellow or Red  Were vital signs accurate and taken at a resting state? Yes  MEWS guidelines implemented  No, previously yellow, continue vital signs every 4 hours  Notify: Charge Nurse/RN  Name of Charge Nurse/RN Notified Kristy  Assess: SIRS CRITERIA  SIRS Temperature  0  SIRS Respirations  0  SIRS Pulse 1  SIRS WBC 0  SIRS Score Sum  1

## 2023-09-16 NOTE — Progress Notes (Signed)
   09/16/23 0745  Assess: MEWS Score  Temp 98.5 F (36.9 C)  BP 94/82  Pulse Rate (!) 103  Resp 16  SpO2 99 %  O2 Device Room Air  Assess: MEWS Score  MEWS Temp 0  MEWS Systolic 1  MEWS Pulse 1  MEWS RR 0  MEWS LOC 0  MEWS Score 2  MEWS Score Color Yellow  Assess: if the MEWS score is Yellow or Red  Were vital signs accurate and taken at a resting state? Yes  MEWS guidelines implemented  No, previously yellow, continue vital signs every 4 hours  Assess: SIRS CRITERIA  SIRS Temperature  0  SIRS Respirations  0  SIRS Pulse 1  SIRS WBC 0  SIRS Score Sum  1

## 2023-09-16 NOTE — TOC Progression Note (Signed)
 Transition of Care [facility]) - Progression Note    Patient Details  Name: Raymond Hays MRN: [id number] Date of Birth: Aug 17, 1945  [facility]) CM/SW Contact  Loman Risk, RN Phone Number: 09/16/2023, 3:27 PM  Clinical Narrative:     Abran Abrahams at [facility] to check status of [facility] auth       Expected Discharge Plan and Services                                               Social Determinants of Health (SDOH) Interventions SDOH Screenings   Food Insecurity: No Food Insecurity (09/12/2023)  Housing: Low Risk  (09/12/2023)  Transportation Needs: No Transportation Needs (09/12/2023)  Utilities: Not At Risk (09/12/2023)  Depression (PHQ2-9): Low Risk  (09/25/2022)  Financial Resource Strain: Low Risk  (05/02/2023)  Social Connections: Unknown (09/12/2023)  Recent Concern: Social Connections - Socially Isolated (07/04/2023)  Tobacco Use: Medium Risk (09/12/2023)    Readmission Risk Interventions    07/04/2023   11:21 AM 03/12/2023    2:19 PM 01/12/2021    1:08 PM  Readmission Risk Prevention Plan  Transportation Screening Complete  Complete  PCP or Specialist Appt within 3-5 Days     Social Work Consult for Recovery Care Planning/Counseling     Palliative Care Screening     Medication Review Oceanographer) Complete  Complete  PCP or Specialist appointment within 3-5 days of discharge Complete  Complete  HRI or Home Care Consult Not Complete  Complete  HRI or Home Care Consult Pt Refusal Comments SNF resident    SW Recovery Care/Counseling Consult Complete  Complete  Palliative Care Screening Not Applicable  Not Applicable  Skilled Nursing Facility Complete  Complete     Information is confidential and restricted. Go to Review Flowsheets to unlock data.

## 2023-09-16 NOTE — Progress Notes (Signed)
 PROGRESS NOTE    Raymond Hays   ZOX:[id number] DOB: 06-11-1945  DOA: 09/12/2023 Date of Service: 09/16/23 which is hospital day 4  PCP: Rogenia Clause, MD    Hospital course / significant events:   HPI: Raymond Hays is a 78 y.o. male with medical history significant of COPD, chronic hypoxic respiratory failure on 2 L as needed, HTN, chronic HFpEF, severe mitral regurgitation status post MitraClip, chronic suprapubic Foley catheter, neurogenic bladder, PAF on Eliquis , wheelchair-bound, Parkinson's disease, HTN, IDDM, diabetic neuropathy. Resides at long term care SNF [facility]). Presented to ED 09/12/2023 - ENS was called for pt unresponsive to staff, EMS performed sternal rub and pt was found responsive. Brought to ED w/ concern for syncopal episode.   Patient reporting a productive cough x1 week, with intermittent yellowish sputum production, wheezing x2 to 3 days despite using more inhaler medications.  Increasingly SOB at rest.  This morning, while at breakfast table, patient  blanked out, slumped over, no fall.   06/13: to ED as above. Imaging (+)pneumonia. Pt also disimpacted and improved abd distension. WBC 14.1, initial elevated RR but then normal, not consistently meeting SIRS/sepsis. Admitted to hospitalist for syncope / pneumonia / CHF exacerbation. Abx, steroids, nebs, diuresis. Lactate increased then trending down again overnight.  06/14: pt intermittently refusing neb treatments, other therapies. Lung sounds (+)rhonchi/wheezing diffusely but stable on room air. Echo pending  06/15: good uop through yesterday/overnight. Was refusing PT/OT, echo and some meds today. Slight low K, improved Cr. Increased neb breathing tx, he is not using IS/flutter valve as directed.  06/16: pt reports improvement today, still some rales/edema but improved. Await echo read. Transition to po diuretics.  06/17: Echo EF 60-65, diastolic fxn unable to evaluate. Otherwise feeling much better today, now  waiting on auth to go back to SNF/LTC     Consultants:  none  Procedures/Surgeries: none      ASSESSMENT & PLAN:   Syncope versus acute metabolic encephalopathy Multifactorial, DDx pneumonia and COPD exacerbation, CHF/HFpEF exacerbation, less likely potential cardiac arrhythmia  Treat underlying cause(s) as noted  Continue midodrine     RLL PNA, bacterial Continue ceftriaxone    Doxycycline  due to EKG showing prolonged QTc no azithro Po ax on discharge    Acute COPD exacerbation P.o. steroid burst x5 days total  Antibiotics as above DuoNebs q6h Defer ICS d/t PNA Incentive spirometry and flutter valve encouraged O2 prn    Acute on chronic HFpEF decompensation Echo EF 60-65, diastolic fxn unable to evaluate Lasix  from IV to 40 mg po bid  Strict I&O's and daily weight Limited GDMT d/t hypotension   AKI - resolved   Monitor closely on lasix    Mild aspiration risk Reflux PPI bid  Dysphagia 3 diet  SLP following  Hypokalemia Replace as needed Monitor BMP  PAF Rate controlled Hold/dc Cardizem  due to active CHF decompensation and borderline low blood pressure metoprolol , + as needed Lopressor  for rapid rate  Eliquis    IDDM SSI    Diabetic neuropathy gabapentin    Hypothyroidism Synthroid    Parkinson's disease Sinemet    Anxiety/depression Mentation at baseline.   SSRI   Deconditioning PT evaluation  Itching/hives appears c/w contact dermatitis  2nd gen antihistamine scheduled  Po steroids for COPD may also help this  Monitor    Class 1 obesity based on BMI: Body mass index is 31.01 kg/m.[name] Significantly low or high BMI is associated with higher medical risk.  Underweight - under 18  overweight - 25 to 29 obese -  30 or more Class 1 obesity: BMI of 30.0 to 34 Class 2 obesity: BMI of 35.0 to 39 Class 3 obesity: BMI of 40.0 to 49 Super Morbid Obesity: BMI 50-59 Super-super Morbid Obesity: BMI 60+ Healthy nutrition and physical activity  advised as adjunct to other disease management and risk reduction treatments    DVT prophylaxis: Eliquis  IV fluids: no continuous IV fluids  Nutrition: cardiac/carb diet  Central lines / other devices: chronic suprapubic cath  Code Status: DNR ACP documentation reviewed: has MOST on file in [facility] - (+)DNR and limitd interventions   TOC needs: awaiting auth to go back to LTC (waiting on the [place]) Medical barriers to dispo: none.              Subjective / Brief ROS:  Patient reports feeling much better today  Denies CP/SOB Reports cough is persisting  Pain controlled.  Denies new weakness.    Family Communication: none at this time    Objective Findings:  Vitals:   09/16/23 0745 09/16/23 0932 09/16/23 1600 09/16/23 1709  BP: 94/82 119/85 107/66   Pulse: (!) 103 (!) 115 (!) 112   Resp: 16  16   Temp: 98.5 F (36.9 C)  98.2 F (36.8 C)   TempSrc: Oral     SpO2: 99%  98% 97%  Weight:      Height:        Intake/Output Summary (Last 24 hours) at 09/16/2023 1743 Last data filed at 09/16/2023 1654 Gross per 24 hour  Intake 820 ml  Output 700 ml  Net 120 ml   Filed Weights   09/12/23 1329  Weight: 89.8 kg    Examination:  Physical Exam Constitutional:      General: He is not in acute distress.  Cardiovascular:     Rate and Rhythm: Normal rate and regular rhythm.  Pulmonary:     Effort: Pulmonary effort is normal. No respiratory distress.     Breath sounds: Wheezing and rhonchi (improved from yesterday) present. No rales (mild/improved today).  Abdominal:     Palpations: Abdomen is soft.   Musculoskeletal:     Right lower leg: Edema present.     Left lower leg: Edema present.   Skin:    General: Skin is warm and dry.     Findings: Rash (hives over chest) present.   Neurological:     Mental Status: He is alert and oriented to person, place, and time. Mental status is at baseline.   Psychiatric:        Behavior: Behavior normal.           Scheduled Medications:   acidophilus  1 capsule Oral Daily   allopurinol   200 mg Oral Daily   apixaban   5 mg Oral BID   atorvastatin   20 mg Oral QHS   bisacodyl   10 mg Rectal Q3 days   carbidopa -levodopa   1 tablet Oral TID AC   dextromethorphan -guaiFENesin   2 tablet Oral BID   docusate sodium   100 mg Oral Daily   doxycycline   100 mg Oral Q12H   fluticasone   2 spray Each Nare Daily   furosemide   40 mg Oral BID   gabapentin   800 mg Oral QID   insulin  aspart  0-15 Units Subcutaneous TID WC   ipratropium  2 spray Each Nare TID   ipratropium-albuterol   3 mL Nebulization Q6H   lactase  3,000 Units Oral TID WC   lactulose   30 g Oral Daily   levothyroxine   75 mcg  Oral QAC breakfast   loratadine   10 mg Oral Daily   methocarbamol   1,000 mg Oral Q12H   metoprolol  tartrate  12.5 mg Oral BID   midodrine   2.5 mg Oral TID WC   oxybutynin   5 mg Oral BID   pantoprazole   40 mg Oral BID   polyethylene glycol  17 g Oral Daily   potassium chloride   40 mEq Oral Daily   predniSONE   40 mg Oral Q breakfast   traZODone   100 mg Oral QHS    Continuous Infusions:  cefTRIAXone  (ROCEPHIN )  IV 2 g (09/16/23 0937)    PRN Medications:  acetaminophen  **OR** acetaminophen , albuterol , hydrocortisone , hydrOXYzine , metoCLOPramide  (REGLAN ) injection, metoprolol  tartrate, promethazine , senna  Antimicrobials from admission:  Anti-infectives (From admission, onward)    Start     Dose/Rate Route Frequency Ordered Stop   09/14/23 2200  doxycycline  (VIBRA -TABS) tablet 100 mg        100 mg Oral Every 12 hours 09/14/23 0733     09/13/23 1000  cefTRIAXone  (ROCEPHIN ) 1 g in sodium chloride  0.9 % 100 mL IVPB  Status:  Discontinued        1 g 200 mL/hr over 30 Minutes Intravenous Every 24 hours 09/12/23 1720 09/12/23 1720   09/13/23 1000  doxycycline  (VIBRAMYCIN ) 100 mg in sodium chloride  0.9 % 250 mL IVPB        100 mg 125 mL/hr over 120 Minutes Intravenous Every 12 hours 09/12/23 1720 09/14/23 1253    09/13/23 1000  cefTRIAXone  (ROCEPHIN ) 2 g in sodium chloride  0.9 % 100 mL IVPB        2 g 200 mL/hr over 30 Minutes Intravenous Every 24 hours 09/12/23 1720     09/12/23 1715  cefTRIAXone  (ROCEPHIN ) 2 g in sodium chloride  0.9 % 100 mL IVPB        2 g 200 mL/hr over 30 Minutes Intravenous Once 09/12/23 1711 09/12/23 1824   09/12/23 1715  azithromycin  (ZITHROMAX ) 500 mg in sodium chloride  0.9 % 250 mL IVPB  Status:  Discontinued        500 mg 250 mL/hr over 60 Minutes Intravenous  Once 09/12/23 1711 09/13/23 1610           Data Reviewed:  I have personally reviewed the following...  CBC: Recent Labs  Lab 09/12/23 1345 09/13/23 0903  WBC 14.1* 10.3  NEUTROABS 11.7*  --   HGB 13.0 12.5*  HCT 39.8 37.1*  MCV 93.9 91.8  PLT 268 270   Basic Metabolic Panel: Recent Labs  Lab 09/12/23 1345 09/13/23 0903 09/14/23 0522 09/15/23 0347 09/16/23 0329  NA 134* 135 135 139 139  K 4.0 4.2 3.3* 3.8 3.8  CL 97* 96* 99 102 103  CO2 26 24 25 28 26   GLUCOSE 162* 231* 161* 143* 146*  BUN 36* 48* 49* 44* 49*  CREATININE 1.24 1.58* 1.16 1.00 0.99  CALCIUM  9.6 9.7 9.3 9.5 9.2   GFR: Estimated Creatinine Clearance: 64.7 mL/min (by C-G formula based on SCr of 0.99 mg/dL). Liver Function Tests: Recent Labs  Lab 09/12/23 1345  AST 20  ALT 6  ALKPHOS 113  BILITOT 1.1  PROT 7.8  ALBUMIN 3.5   No results for input(s): LIPASE, AMYLASE in the last 168 hours. No results for input(s): AMMONIA in the last 168 hours. Coagulation Profile: No results for input(s): INR, PROTIME in the last 168 hours. Cardiac Enzymes: No results for input(s): CKTOTAL, CKMB, CKMBINDEX, TROPONINI in the last 168 hours. BNP (last 3 results)  No results for input(s): PROBNP in the last 8760 hours. HbA1C: No results for input(s): HGBA1C in the last 72 hours. CBG: Recent Labs  Lab 09/15/23 1617 09/15/23 2107 09/16/23 0744 09/16/23 1140 09/16/23 1557  GLUCAP 228* 140* 119* 132*  318*   Lipid Profile: No results for input(s): CHOL, HDL, LDLCALC, TRIG, CHOLHDL, LDLDIRECT in the last 72 hours. Thyroid Function Tests: No results for input(s): TSH, T4TOTAL, FREET4, T3FREE, THYROIDAB in the last 72 hours. Anemia Panel: No results for input(s): VITAMINB12, FOLATE, FERRITIN, TIBC, IRON, RETICCTPCT in the last 72 hours. Most Recent Urinalysis On File:     Component Value Date/Time   COLORURINE YELLOW (A) 07/03/2023 1558   APPEARANCEUR CLOUDY (A) 07/03/2023 1558   LABSPEC 1.012 07/03/2023 1558   PHURINE 7.0 07/03/2023 1558   GLUCOSEU NEGATIVE 07/03/2023 1558   HGBUR LARGE (A) 07/03/2023 1558   BILIRUBINUR NEGATIVE 07/03/2023 1558   KETONESUR NEGATIVE 07/03/2023 1558   PROTEINUR NEGATIVE 07/03/2023 1558   NITRITE NEGATIVE 07/03/2023 1558   LEUKOCYTESUR MODERATE (A) 07/03/2023 1558   Sepsis Labs: @LABRCNTIP (procalcitonin:4,lacticidven:4) Microbiology: Recent Results (from the past 240 hours)  Blood culture (routine x 2)     Status: None (Preliminary result)   Collection Time: 09/12/23  5:46 PM   Specimen: BLOOD  Result Value Ref Range Status   Specimen Description BLOOD BLOOD LEFT FOREARM  Final   Special Requests   Final    BOTTLES DRAWN AEROBIC AND ANAEROBIC Blood Culture results may not be optimal due to an inadequate volume of blood received in culture bottles   Culture   Final    NO GROWTH 4 DAYS Performed at [facility], [address], [place], [place] [address]    Report Status PENDING  Incomplete  Blood culture (routine x 2)     Status: None (Preliminary result)   Collection Time: 09/12/23  5:46 PM   Specimen: BLOOD  Result Value Ref Range Status   Specimen Description BLOOD LEFT ANTECUBITAL  Final   Special Requests   Final    BOTTLES DRAWN AEROBIC AND ANAEROBIC Blood Culture results may not be optimal due to an inadequate volume of blood received in culture bottles   Culture   Final    NO GROWTH 4  DAYS Performed at [facility], [address], [place], [place] [address]    Report Status PENDING  Incomplete      Radiology Studies last 3 days: ECHOCARDIOGRAM COMPLETE Result Date: 09/15/2023    ECHOCARDIOGRAM REPORT   Patient Name:   MAXIMOS ZAYAS Date of Exam: 09/15/2023 Medical Rec #:  [id number]  Height:       67.0 in Accession #:    [id number] Weight:       198.0 lb Date of Birth:  12/24/1945  BSA:          2.014 m Patient Age:    77 years   BP:           137/92 mmHg Patient Gender: M          HR:           84 bpm. Exam Location:  [facility] Procedure: 2D Echo, Cardiac Doppler and Color Doppler (Both Spectral and Color            Flow Doppler were utilized during procedure). Indications:     Syncope R55  History:         Patient has prior history of Echocardiogram examinations, most  recent 09/16/2022. COPD, Arrythmias:Atrial Fibrillation; Risk                  Factors:Dyslipidemia.  Sonographer:     Broadus Canes Referring Phys:  [id number] [place] Diagnosing Phys: Antonette Batters MD  Sonographer Comments: Technically challenging study due to limited acoustic windows, no apical window and no subcostal window. Image acquisition challenging due to COPD. IMPRESSIONS  1. TDS.  2. Left ventricular ejection fraction, by estimation, is 60 to 65%. The left ventricle has normal function. The left ventricle has no regional wall motion abnormalities. There is moderate eccentric left ventricular hypertrophy of the septal segment. Left ventricular diastolic function could not be evaluated.  3. Right ventricular systolic function is low normal. The right ventricular size is mildly enlarged.  4. Left atrial size was moderately dilated.  5. Right atrial size was mildly dilated.  6. The mitral valve was not well visualized. Mild mitral valve regurgitation. Moderate mitral annular calcification.  7. The aortic valve was not well visualized. Aortic valve regurgitation is trivial. Aortic valve  sclerosis/calcification is present, without any evidence of aortic stenosis. Conclusion(s)/Recommendation(s): Poor windows for evaluation of left ventricular function by transthoracic echocardiography. Would recommend an alternative means of evaluation. FINDINGS  Left Ventricle: Left ventricular ejection fraction, by estimation, is 60 to 65%. The left ventricle has normal function. The left ventricle has no regional wall motion abnormalities. Strain was performed and the global longitudinal strain is indeterminate. Global longitudinal strain performed but not reported based on interpreter judgement due to suboptimal tracking. The left ventricular internal cavity size was normal in size. There is moderate eccentric left ventricular hypertrophy of the septal segment. Left ventricular diastolic function could not be evaluated. Right Ventricle: The right ventricular size is mildly enlarged. No increase in right ventricular wall thickness. Right ventricular systolic function is low normal. Left Atrium: Left atrial size was moderately dilated. Right Atrium: Right atrial size was mildly dilated. Pericardium: There is no evidence of pericardial effusion. Mitral Valve: The mitral valve was not well visualized. There is mild thickening of the mitral valve leaflet(s). There is moderate calcification of the mitral valve leaflet(s). Mildly decreased mobility of the mitral valve leaflets. Moderate mitral annular calcification. Mild mitral valve regurgitation. Tricuspid Valve: The tricuspid valve is not well visualized. Tricuspid valve regurgitation is mild. Aortic Valve: The aortic valve was not well visualized. Aortic valve regurgitation is trivial. Aortic valve sclerosis/calcification is present, without any evidence of aortic stenosis. Pulmonic Valve: The pulmonic valve was not assessed. Pulmonic valve regurgitation is mild. Aorta: The ascending aorta was not well visualized. IAS/Shunts: No atrial level shunt detected by color  flow Doppler. Additional Comments: TDS. 3D was performed not requiring image post processing on an independent workstation and was indeterminate.  LEFT VENTRICLE PLAX 2D LVIDd:         4.60 cm LVIDs:         3.10 cm LV PW:         1.00 cm LV IVS:        1.80 cm LVOT diam:     2.10 cm LVOT Area:     3.46 cm  LEFT ATRIUM         Index LA diam:    5.30 cm 2.63 cm/m   AORTA Ao Root diam: 3.70 cm  SHUNTS Systemic Diam: 2.10 cm Antonette Batters MD Electronically signed by Antonette Batters MD Signature Date/Time: 09/15/2023/5:55:45 PM    Final  Jeannemarie Sawaya, DO [facility] 09/16/2023, 5:43 PM    Dictation software may have been used to generate the above note. Typos may occur and escape review in typed/dictated notes. Please contact Dr Authur Leghorn directly for clarity if needed.  Staff may message me via secure chat in Epic  but this may not receive an immediate response,  please page me for urgent matters!  If 7PM-7AM, please contact night coverage [email or web address]

## 2023-09-16 NOTE — Plan of Care (Signed)
  Problem: Fluid Volume: Goal: Ability to maintain a balanced intake and output will improve Outcome: Progressing   Problem: Nutritional: Goal: Maintenance of adequate nutrition will improve Outcome: Progressing   Problem: Education: Goal: Knowledge of General Education information will improve Description: Including pain rating scale, medication(s)/side effects and non-pharmacologic comfort measures Outcome: Progressing

## 2023-09-17 DIAGNOSIS — J189 Pneumonia, unspecified organism: Secondary | ICD-10-CM

## 2023-09-17 LAB — GLUCOSE, CAPILLARY
Glucose-Capillary: 145 mg/dL — ABNORMAL HIGH (ref 70–99)
Glucose-Capillary: 131 mg/dL — ABNORMAL HIGH (ref 70–99)
Glucose-Capillary: 183 mg/dL — ABNORMAL HIGH (ref 70–99)

## 2023-09-17 LAB — CULTURE, BLOOD (ROUTINE X 2)
Culture: NO GROWTH
Culture: NO GROWTH

## 2023-09-17 MED ORDER — OXYBUTYNIN CHLORIDE 5 MG PO TABS
2.5000 mg | ORAL_TABLET | Freq: Two times a day (BID) | ORAL | Status: DC
  Administered 2023-09-17: 2.5 mg via ORAL
  Filled 2023-09-17: qty 1

## 2023-09-17 MED ORDER — OXYBUTYNIN CHLORIDE 5 MG PO TABS: 2.5000 mg | ORAL_TABLET | Freq: Two times a day (BID) | ORAL | Status: AC

## 2023-09-17 MED ORDER — IPRATROPIUM-ALBUTEROL 0.5-2.5 (3) MG/3ML IN SOLN: 3.0000 mL | Freq: Four times a day (QID) | RESPIRATORY_TRACT | Status: DC | PRN

## 2023-09-17 NOTE — TOC Progression Note (Addendum)
 Transition of Care [facility]) - Progression Note    Patient Details  Name: Raymond Hays MRN: [id number] Date of Birth: [birth year]  [facility] CM/SW Contact  Odilia Bennett, LCSW Phone Number: 09/17/2023, 10:25 AM  Clinical Narrative:  Raejean Bullock still pending. [facility] admissions coordinator is hoping to get it back this morning.   1:38 pm: [place] auth still pending. SNF has notified patient and said he plans to call the [place].  3:05 pm: [place] auth is approved. SNF can accept him back today if discharge summary is in by 4:00. Sent secure chat to MD and RN to notify.  Expected Discharge Plan and Services                                               Social Determinants of Health (SDOH) Interventions SDOH Screenings   Food Insecurity: No Food Insecurity (09/12/2023)  Housing: Low Risk  (09/12/2023)  Transportation Needs: No Transportation Needs (09/12/2023)  Utilities: Not At Risk (09/12/2023)  Depression (PHQ2-9): Low Risk  (09/25/2022)  Financial Resource Strain: Low Risk  (05/02/2023)  Social Connections: Unknown (09/12/2023)  Recent Concern: Social Connections - Socially Isolated (07/04/2023)  Tobacco Use: Medium Risk (09/12/2023)    Readmission Risk Interventions    07/04/2023   11:21 AM 03/12/2023    2:19 PM 01/12/2021    1:08 PM  Readmission Risk Prevention Plan  Transportation Screening Complete  Complete  PCP or Specialist Appt within 3-5 Days     Social Work Consult for Recovery Care Planning/Counseling     Palliative Care Screening     Medication Review Oceanographer) Complete  Complete  PCP or Specialist appointment within 3-5 days of discharge Complete  Complete  HRI or Home Care Consult Not Complete  Complete  HRI or Home Care Consult Pt Refusal Comments SNF resident    SW Recovery Care/Counseling Consult Complete  Complete  Palliative Care Screening Not Applicable  Not Applicable  Skilled Nursing Facility Complete  Complete     Information is  confidential and restricted. Go to Review Flowsheets to unlock data.

## 2023-09-17 NOTE — Progress Notes (Incomplete)
 PROGRESS NOTE  Raymond Hays    DOB: 04-05-1945, 78 y.o.  ZHY:[id number]    Code Status: Limited: Do not attempt resuscitation (DNR) -DNR-LIMITED -Do Not Intubate/DNI    DOA: 09/12/2023   LOS: 5   Brief hospital course  Raymond Hays is a 78 y.o. male with medical history significant of COPD, chronic hypoxic respiratory failure on 2 L as needed, HTN, chronic HFpEF, severe mitral regurgitation status post MitraClip, chronic suprapubic Foley catheter, neurogenic bladder, PAF on Eliquis , wheelchair-bound, Parkinson's disease, HTN, IDDM, diabetic neuropathy. Resides at long term care SNF [facility]). Presented to ED 09/12/2023 - ENS was called for pt unresponsive to staff, EMS performed sternal rub and pt was found responsive. Brought to ED w/ concern for syncopal episode.    Patient reporting a productive cough x1 week, with intermittent yellowish sputum production, wheezing x2 to 3 days despite using more inhaler medications.  Increasingly SOB at rest.  This morning, while at breakfast table, patient  blanked out, slumped over, no fall.    06/13: to ED as above. Imaging (+)pneumonia. Pt also disimpacted and improved abd distension. WBC 14.1, initial elevated RR but then normal, not consistently meeting SIRS/sepsis. Admitted to hospitalist for syncope / pneumonia / CHF exacerbation. Abx, steroids, nebs, diuresis. Lactate increased then trending down again overnight.  06/14: pt intermittently refusing neb treatments, other therapies. Lung sounds (+)rhonchi/wheezing diffusely but stable on room air. Echo pending  06/15: good uop through yesterday/overnight. Was refusing PT/OT, echo and some meds today. Slight low K, improved Cr. Increased neb breathing tx, he is not using IS/flutter valve as directed.  06/16: pt reports improvement today, still some rales/edema but improved. Await echo read. Transition to po diuretics.  06/17: Echo EF 60-65, diastolic fxn unable to evaluate. Otherwise feeling much better  today, now waiting on auth to go back to SNF/LTC  Patient was admitted to medicine service for further workup and management of *** as outlined in detail below.  09/17/23 -***  Assessment & Plan  Principal Problem:   Syncope Active Problems:   PNA (pneumonia)   COPD with acute exacerbation (HCC)   Acute metabolic encephalopathy    Syncope versus acute metabolic encephalopathy Multifactorial, DDx pneumonia and COPD exacerbation, CHF/HFpEF exacerbation, less likely potential cardiac arrhythmia  Treat underlying cause(s) as noted  Continue midodrine     RLL PNA, bacterial Continue ceftriaxone    Doxycycline  due to EKG showing prolonged QTc no azithro Po ax on discharge    Acute COPD exacerbation P.o. steroid burst x5 days total  Antibiotics as above DuoNebs q6h Defer ICS d/t PNA Incentive spirometry and flutter valve encouraged O2 prn    Acute on chronic HFpEF decompensation Echo EF 60-65, diastolic fxn unable to evaluate Lasix  from IV to 40 mg po bid  Strict I&O's and daily weight Limited GDMT d/t hypotension   AKI - resolved   Monitor closely on lasix     Mild aspiration risk Reflux PPI bid  Dysphagia 3 diet  SLP following   Hypokalemia Replace as needed Monitor BMP   PAF Rate controlled Hold/dc Cardizem  due to active CHF decompensation and borderline low blood pressure metoprolol , + as needed Lopressor  for rapid rate  Eliquis    IDDM SSI    Diabetic neuropathy gabapentin    Hypothyroidism Synthroid    Parkinson's disease Sinemet    Anxiety/depression Mentation at baseline.   SSRI   Deconditioning PT evaluation   Itching/hives appears c/w contact dermatitis  2nd gen antihistamine scheduled  Po steroids for COPD may also help  this  Monitor    *** -   *** -   *** -   Body mass index is 31.01 kg/m.  VTE ppx:  apixaban  (ELIQUIS ) tablet 5 mg   Diet:     Diet   Diet heart healthy/carb modified Room service appropriate? Yes; Fluid  consistency: Thin   Consultants: ***  Subjective 09/17/23    Pt reports ***   Objective  Blood pressure 110/80, pulse (!) 104, temperature 97.8 F (36.6 C), resp. rate 16, height 5' 7 (1.702 m), weight 89.8 kg, SpO2 100%.  Intake/Output Summary (Last 24 hours) at 09/17/2023 0727 Last data filed at 09/17/2023 0500 Gross per 24 hour  Intake 820 ml  Output 2400 ml  Net -1580 ml   Filed Weights   09/12/23 1329  Weight: 89.8 kg     Physical Exam: *** General: awake, alert, NAD HEENT: atraumatic, clear conjunctiva, anicteric sclera, MMM, hearing grossly normal Respiratory: normal respiratory effort. Cardiovascular: quick capillary refill, normal S1/S2, RRR, no JVD, murmurs Gastrointestinal: soft, NT, ND Nervous: A&O x3. no gross focal neurologic deficits, normal speech Extremities: moves all equally, no edema, normal tone Skin: dry, intact, normal temperature, normal color. No rashes, lesions or ulcers on exposed skin Psychiatry: normal mood, congruent affect  Labs   I have personally reviewed the following labs and imaging studies CBC    Component Value Date/Time   WBC 10.3 09/13/2023 0903   RBC 4.04 (L) 09/13/2023 0903   HGB 12.5 (L) 09/13/2023 0903   HGB 13.4 11/21/2022 1526   HCT 37.1 (L) 09/13/2023 0903   HCT 43.0 11/21/2022 1526   PLT 270 09/13/2023 0903   PLT 378 11/21/2022 1526   MCV 91.8 09/13/2023 0903   MCV 83 11/21/2022 1526   MCH 30.9 09/13/2023 0903   MCHC 33.7 09/13/2023 0903   RDW 15.5 09/13/2023 0903   RDW 15.2 11/21/2022 1526   LYMPHSABS 1.4 09/12/2023 1345   LYMPHSABS 1.7 11/21/2022 1526   MONOABS 0.5 09/12/2023 1345   EOSABS 0.4 09/12/2023 1345   EOSABS 0.3 11/21/2022 1526   BASOSABS 0.1 09/12/2023 1345   BASOSABS 0.1 11/21/2022 1526      Latest Ref Rng & Units 09/16/2023    3:29 AM 09/15/2023    3:47 AM 09/14/2023    5:22 AM  BMP  Glucose 70 - 99 mg/dL 161  096  045   BUN 8 - 23 mg/dL 49  44  49   Creatinine 0.61 - 1.24 mg/dL 4.09   8.11  9.14   Sodium 135 - 145 mmol/L 139  139  135   Potassium 3.5 - 5.1 mmol/L 3.8  3.8  3.3   Chloride 98 - 111 mmol/L 103  102  99   CO2 22 - 32 mmol/L 26  28  25    Calcium  8.9 - 10.3 mg/dL 9.2  9.5  9.3     ECHOCARDIOGRAM COMPLETE Result Date: 09/15/2023    ECHOCARDIOGRAM REPORT   Patient Name:   Raymond Hays Date of Exam: 09/15/2023 Medical Rec #:  [id number]  Height:       67.0 in Accession #:    [id number] Weight:       198.0 lb Date of Birth:  1945-10-01  BSA:          2.014 m Patient Age:    77 years   BP:           137/92 mmHg Patient Gender: M  HR:           84 bpm. Exam Location:  [facility] Procedure: 2D Echo, Cardiac Doppler and Color Doppler (Both Spectral and Color            Flow Doppler were utilized during procedure). Indications:     Syncope R55  History:         Patient has prior history of Echocardiogram examinations, most                  recent 09/16/2022. COPD, Arrythmias:Atrial Fibrillation; Risk                  Factors:Dyslipidemia.  Sonographer:     Broadus Canes Referring Phys:  [id number] [place] Diagnosing Phys: Antonette Batters MD  Sonographer Comments: Technically challenging study due to limited acoustic windows, no apical window and no subcostal window. Image acquisition challenging due to COPD. IMPRESSIONS  1. TDS.  2. Left ventricular ejection fraction, by estimation, is 60 to 65%. The left ventricle has normal function. The left ventricle has no regional wall motion abnormalities. There is moderate eccentric left ventricular hypertrophy of the septal segment. Left ventricular diastolic function could not be evaluated.  3. Right ventricular systolic function is low normal. The right ventricular size is mildly enlarged.  4. Left atrial size was moderately dilated.  5. Right atrial size was mildly dilated.  6. The mitral valve was not well visualized. Mild mitral valve regurgitation. Moderate mitral annular calcification.  7. The aortic valve was not well visualized. Aortic  valve regurgitation is trivial. Aortic valve sclerosis/calcification is present, without any evidence of aortic stenosis. Conclusion(s)/Recommendation(s): Poor windows for evaluation of left ventricular function by transthoracic echocardiography. Would recommend an alternative means of evaluation. FINDINGS  Left Ventricle: Left ventricular ejection fraction, by estimation, is 60 to 65%. The left ventricle has normal function. The left ventricle has no regional wall motion abnormalities. Strain was performed and the global longitudinal strain is indeterminate. Global longitudinal strain performed but not reported based on interpreter judgement due to suboptimal tracking. The left ventricular internal cavity size was normal in size. There is moderate eccentric left ventricular hypertrophy of the septal segment. Left ventricular diastolic function could not be evaluated. Right Ventricle: The right ventricular size is mildly enlarged. No increase in right ventricular wall thickness. Right ventricular systolic function is low normal. Left Atrium: Left atrial size was moderately dilated. Right Atrium: Right atrial size was mildly dilated. Pericardium: There is no evidence of pericardial effusion. Mitral Valve: The mitral valve was not well visualized. There is mild thickening of the mitral valve leaflet(s). There is moderate calcification of the mitral valve leaflet(s). Mildly decreased mobility of the mitral valve leaflets. Moderate mitral annular calcification. Mild mitral valve regurgitation. Tricuspid Valve: The tricuspid valve is not well visualized. Tricuspid valve regurgitation is mild. Aortic Valve: The aortic valve was not well visualized. Aortic valve regurgitation is trivial. Aortic valve sclerosis/calcification is present, without any evidence of aortic stenosis. Pulmonic Valve: The pulmonic valve was not assessed. Pulmonic valve regurgitation is mild. Aorta: The ascending aorta was not well visualized.  IAS/Shunts: No atrial level shunt detected by color flow Doppler. Additional Comments: TDS. 3D was performed not requiring image post processing on an independent workstation and was indeterminate.  LEFT VENTRICLE PLAX 2D LVIDd:         4.60 cm LVIDs:         3.10 cm LV PW:  1.00 cm LV IVS:        1.80 cm LVOT diam:     2.10 cm LVOT Area:     3.46 cm  LEFT ATRIUM         Index LA diam:    5.30 cm 2.63 cm/m   AORTA Ao Root diam: 3.70 cm  SHUNTS Systemic Diam: 2.10 cm Antonette Batters MD Electronically signed by Antonette Batters MD Signature Date/Time: 09/15/2023/5:55:45 PM    Final     Disposition Plan & Communication  Patient status: Inpatient  Admitted From: {From:23814} Planned disposition location: {PLAN; DISPOSITION:26386} Anticipated discharge date: *** pending ***  Family Communication: ***    Author: Ree Candy, DO [facility] 09/17/2023, 7:27 AM   Available by Epic secure chat 7AM-7PM. If 7PM-7AM, please contact night-coverage.  [facility] contact information found on [email or web address].

## 2023-09-17 NOTE — TOC Transition Note (Signed)
 Transition of Care [facility]) - Discharge Note   Patient Details  Name: Raymond Hays MRN: [id number] Date of Birth: [birth year]  [facility] CM/SW Contact:  Odilia Bennett, LCSW Phone Number: 09/17/2023, 4:30 PM   Clinical Narrative: Patient has orders to discharge to [facility] SNF today. RN has already called report. [facility] set up for 5:30. No further concerns. CSW signing off.    Final next level of care: Skilled Nursing Facility Barriers to Discharge: Barriers Resolved   Patient Goals and CMS Choice            Discharge Placement   Existing PASRR number confirmed : 09/16/23          Patient [facility] bed at: [facility] Patient to be transferred to facility by: [employer] Name of family member notified: Patient will call his family Patient and family notified of of transfer: 09/17/23  Discharge Plan and Services Additional resources added to the After Visit Summary for                                       Social Drivers of Health (SDOH) Interventions SDOH Screenings   Food Insecurity: No Food Insecurity (09/12/2023)  Housing: Low Risk  (09/12/2023)  Transportation Needs: No Transportation Needs (09/12/2023)  Utilities: Not At Risk (09/12/2023)  Depression (PHQ2-9): Low Risk  (09/25/2022)  Financial Resource Strain: Low Risk  (05/02/2023)  Social Connections: Unknown (09/12/2023)  Recent Concern: Social Connections - Socially Isolated (07/04/2023)  Tobacco Use: Medium Risk (09/12/2023)     Readmission Risk Interventions    07/04/2023   11:21 AM 03/12/2023    2:19 PM 01/12/2021    1:08 PM  Readmission Risk Prevention Plan  Transportation Screening Complete  Complete  PCP or Specialist Appt within 3-5 Days     Social Work Consult for Recovery Care Planning/Counseling     Palliative Care Screening     Medication Review Oceanographer) Complete  Complete  PCP or Specialist appointment  within 3-5 days of discharge Complete  Complete  HRI or Home Care Consult Not Complete  Complete  HRI or Home Care Consult Pt Refusal Comments SNF resident    SW Recovery Care/Counseling Consult Complete  Complete  Palliative Care Screening Not Applicable  Not Applicable  Skilled Nursing Facility Complete  Complete     Information is confidential and restricted. Go to Review Flowsheets to unlock data.

## 2023-09-17 NOTE — Discharge Summary (Signed)
 Physician Discharge Summary  Patient: Raymond Hays ZOX:[id number] DOB: June 11, 1945   Code Status: Limited: Do not attempt resuscitation (DNR) -DNR-LIMITED -Do Not Intubate/DNI  Admit date: 09/12/2023 Discharge date: 09/17/2023 Disposition: Skilled nursing facility, PT, OT, nurse aid, and RN PCP: Raymond Clause, MD  Recommendations for Outpatient Follow-up:  Follow up with PCP within 1-2 weeks Regarding general hospital follow up and preventative care Recommend evaluating for further discontinuation of polypharmacy concerns and oxybutynin  in particular.   Discharge Diagnoses:  Principal Problem:   Syncope Active Problems:   PNA (pneumonia)   COPD with acute exacerbation (HCC)   Acute metabolic encephalopathy  Brief Hospital Course Summary: Raymond Hays is a 78 y.o. male with medical history significant of COPD, chronic hypoxic respiratory failure on 2 L as needed, HTN, chronic HFpEF, severe mitral regurgitation status post MitraClip, chronic suprapubic Foley catheter, neurogenic bladder, PAF on Eliquis , wheelchair-bound, Parkinson's disease, HTN, IDDM, diabetic neuropathy. Resides at long term care SNF [facility] Presented to ED 09/12/2023 for unresponsiveness. Found to have positive PNA findings on chest imaging and endorsed a 1 week cough. He was treated with doxycycline  and ceftriaxone  for the PNA as well as received diuresis for possible CHF exacerbation contribution.  He completed his antibiotic course while awaiting insurance auth for his facility. He was stable ORA without complaints on day of discharge.  Home meds were continued as previously prescribed with the only exception is weaned down oxybutynin  with hopes to wean off completely due to poor side-effect profile and also on additional agent.   All other chronic conditions were treated with home medications.    Discharge Condition: Good, improved Recommended discharge diet: Regular healthy diet  Consultations: None    Procedures/Studies: Echo- EF 60-65%.   Allergies as of 09/17/2023   No Known Allergies      Medication List     STOP taking these medications    docusate sodium  100 MG capsule Commonly known as: COLACE       TAKE these medications    acetaminophen  325 MG tablet Commonly known as: TYLENOL  Take 2 tablets (650 mg total) by mouth every 6 (six) hours as needed for mild pain (pain score 1-3), fever or moderate pain (pain score 4-6).   Acidophilus Probiotic Blend Caps Take 1 capsule by mouth daily.   Albuterol  Sulfate 108 (90 Base) MCG/ACT Aepb Commonly known as: PROAIR  RESPICLICK Inhale 2 puffs into the lungs every 4 (four) hours as needed (COPD).   allopurinol  100 MG tablet Commonly known as: ZYLOPRIM  Take 200 mg by mouth daily.   apixaban  5 MG Tabs tablet Commonly known as: ELIQUIS  Take 5 mg by mouth 2 (two) times daily.   ascorbic acid  500 MG tablet Commonly known as: VITAMIN C  Take 500 mg by mouth daily.   atorvastatin  20 MG tablet Commonly known as: LIPITOR Take 20 mg by mouth at bedtime.   Biofreeze 4 % Gel Generic drug: Menthol  (Topical Analgesic) Apply 1 application  topically 3 (three) times daily. (Apply to neck)   bisacodyl  10 MG suppository Commonly known as: DULCOLAX Place 10 mg rectally every 3 (three) days. (HOLD if having loose stools)   calcium  carbonate 500 MG chewable tablet Commonly known as: TUMS - dosed in mg elemental calcium  Chew 2 tablets by mouth daily as needed for indigestion or heartburn.   carbidopa -levodopa  25-100 MG tablet Commonly known as: SINEMET  IR Take 1 tablet by mouth 3 (three) times daily before meals.   cetaphil cream Apply 1 Application topically  2 (two) times daily as needed.   dextromethorphan -guaiFENesin  30-600 MG 12hr tablet Commonly known as: MUCINEX  DM Take 1 tablet by mouth 2 (two) times daily as needed for cough.   diltiazem  240 MG 24 hr capsule Commonly known as: CARDIZEM  CD Take 1 capsule (240 mg  total) by mouth daily.   feeding supplement Liqd Take 237 mLs by mouth 2 (two) times daily between meals.   ferrous sulfate  324 (65 Fe) MG Tbec Take 1 tablet by mouth daily.   fluticasone  50 MCG/ACT nasal spray Commonly known as: FLONASE  Place 2 sprays into both nostrils daily.   Fluticasone -Umeclidin-Vilant 100-62.5-25 MCG/ACT Aepb Inhale 1 puff into the lungs daily.   furosemide  20 MG tablet Commonly known as: LASIX  Take 2 tablets (40 mg total) by mouth daily.   gabapentin  800 MG tablet Commonly known as: NEURONTIN  Take 800 mg by mouth 4 (four) times daily.   hydrocortisone  25 MG suppository Commonly known as: ANUSOL -HC Place 25 mg rectally every 4 (four) hours as needed for hemorrhoids or anal itching.   hydrOXYzine  25 MG tablet Commonly known as: ATARAX  Take 25 mg by mouth every 6 (six) hours as needed for itching.   insulin  glargine-yfgn 100 UNIT/ML injection Commonly known as: SEMGLEE  Inject 0.1 mLs (10 Units total) into the skin daily.   ipratropium 0.06 % nasal spray Commonly known as: ATROVENT  Place 2 sprays into both nostrils 3 (three) times daily.   ipratropium-albuterol  0.5-2.5 (3) MG/3ML Soln Commonly known as: DUONEB Take 3 mLs by nebulization every 6 (six) hours.   lactase 3000 units tablet Commonly known as: LACTAID Take 3,000 Units by mouth 3 (three) times daily with meals.   lactulose  10 GM/15ML solution Commonly known as: CHRONULAC  Take 45 mLs (30 g total) by mouth daily.   levothyroxine  75 MCG tablet Commonly known as: SYNTHROID  Take 75 mcg by mouth daily before breakfast.   lidocaine  4 % Place 1 patch onto the skin daily as needed (pain). (Remove after 12 hours)   liver oil-zinc  oxide 40 % ointment Commonly known as: DESITIN Apply topically 2 (two) times daily. On skin around suprapubic cathether   Metamucil Smooth Texture 58.6 % powder Generic drug: psyllium Take 1 packet by mouth 3 (three) times daily.   methocarbamol  500 MG  tablet Commonly known as: ROBAXIN  Take 1,000 mg by mouth every 12 (twelve) hours. At 1400   metoprolol  tartrate 25 MG tablet Commonly known as: LOPRESSOR  Take 0.5 tablets (12.5 mg total) by mouth 2 (two) times daily. Reduced from 50 mg.   midodrine  2.5 MG tablet Commonly known as: PROAMATINE  Take 1 tablet (2.5 mg total) by mouth 3 (three) times daily with meals.   mirabegron  ER 25 MG Tb24 tablet Commonly known as: MYRBETRIQ  Take 1 tablet (25 mg total) by mouth daily.   OcuSoft Lid Scrub Original Pads Place 1 Application into both eyes daily. Wipe both eyelids once per day.   oxybutynin  5 MG tablet Commonly known as: DITROPAN  Take 0.5 tablets (2.5 mg total) by mouth 2 (two) times daily. What changed: how much to take   pantoprazole  40 MG tablet Commonly known as: PROTONIX  Take 1 tablet (40 mg total) by mouth daily.   polyethylene glycol 17 g packet Commonly known as: MIRALAX  / GLYCOLAX  Take 17 g by mouth daily.   pramoxine 1 % Lotn Commonly known as: SARNA SENSITIVE Apply 1 Application topically 4 (four) times daily as needed.   Propylene Glycol 0.6 % Soln Place 2 drops into both eyes in the  morning, at noon, and at bedtime.   senna 8.6 MG Tabs tablet Commonly known as: SENOKOT Take 1 tablet (8.6 mg total) by mouth daily as needed for mild constipation.   sodium chloride  0.65 % Soln nasal spray Commonly known as: OCEAN Place 2 sprays into both nostrils every 6 (six) hours as needed for congestion.   sodium phosphate Enem Place 133 mLs (1 enema total) rectally daily as needed for severe constipation.   tiZANidine  2 MG tablet Commonly known as: ZANAFLEX  Take 2 mg by mouth 2 (two) times daily as needed for muscle spasms.   triamcinolone  cream 0.1 % Commonly known as: KENALOG  Apply 1 Application topically daily as needed. Apply to arms, legs, and back r/t rash   Vitamin D3 50 MCG (2000 UT) Tabs Take 1 tablet by mouth daily.        Contact information for  after-discharge care     Destination     [facility] .   Service: Skilled Nursing Contact information: [address] [place]  [phone]                    Subjective   Pt reports feeling well. Denies SOB. He wants to go back to his facility and is disgruntled at how long it takes to get there.   All questions and concerns were addressed at time of discharge.  Objective  Blood pressure 103/84, pulse (!) 120, temperature 97.9 F (36.6 C), temperature source Oral, resp. rate 18, height 5' 7 (1.702 m), weight 89.8 kg, SpO2 93%.   General: Pt is alert, awake, not in acute distress Cardiovascular: RRR, S1/S2 +, no rubs, no gallops Respiratory: CTA bilaterally, no wheezing, no rhonchi Abdominal: Soft, NT, ND, bowel sounds + Extremities: no edema, no cyanosis  The results of significant diagnostics from this hospitalization (including imaging, microbiology, ancillary and laboratory) are listed below for reference.   Imaging studies: ECHOCARDIOGRAM COMPLETE Result Date: 09/15/2023    ECHOCARDIOGRAM REPORT   Patient Name:   THURLOW GALLAGA Date of Exam: 09/15/2023 Medical Rec #:  [id number]  Height:       67.0 in Accession #:    [id number] Weight:       198.0 lb Date of Birth:  Oct 30, 1945  BSA:          2.014 m Patient Age:    77 years   BP:           137/92 mmHg Patient Gender: M          HR:           84 bpm. Exam Location:  [facility] Procedure: 2D Echo, Cardiac Doppler and Color Doppler (Both Spectral and Color            Flow Doppler were utilized during procedure). Indications:     Syncope R55  History:         Patient has prior history of Echocardiogram examinations, most                  recent 09/16/2022. COPD, Arrythmias:Atrial Fibrillation; Risk                  Factors:Dyslipidemia.  Sonographer:     Broadus Canes Referring Phys:  [id number] [place] Diagnosing Phys: Antonette Batters MD  Sonographer Comments: Technically challenging study due to  limited acoustic windows, no apical window and no subcostal window. Image acquisition challenging due to COPD. IMPRESSIONS  1. TDS.  2. Left ventricular ejection fraction, by estimation, is 60 to 65%. The left ventricle has normal function. The left ventricle has no regional wall motion abnormalities. There is moderate eccentric left ventricular hypertrophy of the septal segment. Left ventricular diastolic function could not be evaluated.  3. Right ventricular systolic function is low normal. The right ventricular size is mildly enlarged.  4. Left atrial size was moderately dilated.  5. Right atrial size was mildly dilated.  6. The mitral valve was not well visualized. Mild mitral valve regurgitation. Moderate mitral annular calcification.  7. The aortic valve was not well visualized. Aortic valve regurgitation is trivial. Aortic valve sclerosis/calcification is present, without any evidence of aortic stenosis. Conclusion(s)/Recommendation(s): Poor windows for evaluation of left ventricular function by transthoracic echocardiography. Would recommend an alternative means of evaluation. FINDINGS  Left Ventricle: Left ventricular ejection fraction, by estimation, is 60 to 65%. The left ventricle has normal function. The left ventricle has no regional wall motion abnormalities. Strain was performed and the global longitudinal strain is indeterminate. Global longitudinal strain performed but not reported based on interpreter judgement due to suboptimal tracking. The left ventricular internal cavity size was normal in size. There is moderate eccentric left ventricular hypertrophy of the septal segment. Left ventricular diastolic function could not be evaluated. Right Ventricle: The right ventricular size is mildly enlarged. No increase in right ventricular wall thickness. Right ventricular systolic function is low normal. Left Atrium: Left atrial size was moderately dilated. Right Atrium: Right atrial size was mildly  dilated. Pericardium: There is no evidence of pericardial effusion. Mitral Valve: The mitral valve was not well visualized. There is mild thickening of the mitral valve leaflet(s). There is moderate calcification of the mitral valve leaflet(s). Mildly decreased mobility of the mitral valve leaflets. Moderate mitral annular calcification. Mild mitral valve regurgitation. Tricuspid Valve: The tricuspid valve is not well visualized. Tricuspid valve regurgitation is mild. Aortic Valve: The aortic valve was not well visualized. Aortic valve regurgitation is trivial. Aortic valve sclerosis/calcification is present, without any evidence of aortic stenosis. Pulmonic Valve: The pulmonic valve was not assessed. Pulmonic valve regurgitation is mild. Aorta: The ascending aorta was not well visualized. IAS/Shunts: No atrial level shunt detected by color flow Doppler. Additional Comments: TDS. 3D was performed not requiring image post processing on an independent workstation and was indeterminate.  LEFT VENTRICLE PLAX 2D LVIDd:         4.60 cm LVIDs:         3.10 cm LV PW:         1.00 cm LV IVS:        1.80 cm LVOT diam:     2.10 cm LVOT Area:     3.46 cm  LEFT ATRIUM         Index LA diam:    5.30 cm 2.63 cm/m   AORTA Ao Root diam: 3.70 cm  SHUNTS Systemic Diam: 2.10 cm Antonette Batters MD Electronically signed by Antonette Batters MD Signature Date/Time: 09/15/2023/5:55:45 PM    Final    CT ABDOMEN PELVIS WO CONTRAST Addendum Date: 09/12/2023 ADDENDUM REPORT: 09/12/2023 16:31 ADDENDUM: Partial right lower lobe consolidation with bronchiectasis, likely a component of pulmonary scarring. Difficult to exclude recurrent infection or aspiration. Electronically Signed   By: Esmeralda Hedge M.D.   On: 09/12/2023 16:31   Result Date: 09/12/2023 CLINICAL DATA:  Unresponsive distended abdomen EXAM: CT ABDOMEN AND PELVIS WITHOUT CONTRAST TECHNIQUE: Multidetector CT imaging of the abdomen and pelvis was performed following  the  standard protocol without IV contrast. RADIATION DOSE REDUCTION: This exam was performed according to the departmental dose-optimization program which includes automated exposure control, adjustment of the mA and/or kV according to patient size and/or use of iterative reconstruction technique. COMPARISON:  Radiograph 05/01/2023, CT 04/29/2023, 09/29/2022, 04/16/2022 FINDINGS: Lower chest: Lung bases demonstrate partial right lower lobe consolidation with bronchiectasis. Coronary vascular calcification. Postsurgical changes at the mitral valve. Cardiomegaly. Hepatobiliary: Hepatic cyst. Small gallstone. No biliary dilatation Pancreas: Unremarkable. No pancreatic ductal dilatation or surrounding inflammatory changes. Spleen: Normal in size without focal abnormality. Adrenals/Urinary Tract: Stable adrenal glands. No hydronephrosis. Stable left renal cyst and subcentimeter renal lesions too small to further characterize, no specific imaging follow-up is recommended. Possible hyperdense exophytic lesion lower pole left kidney measuring 13 mm on series 2, image 52. Suprapubic Foley catheter Stomach/Bowel: Stomach nonenlarged. No dilated small bowel. Large stool burden throughout the colon with impacted appearing feces at the rectum. Marked sigmoid colon air distension, chronic finding. Vascular/Lymphatic: Aortic atherosclerosis. No enlarged abdominal or pelvic lymph nodes. Reproductive: Prostate is unremarkable. Other: Negative for pelvic effusion or free air. Musculoskeletal: Bilateral hip replacements. Multilevel degenerative changes. Postsurgical changes of the lower lumbar spine. IMPRESSION: 1. Large stool burden throughout the colon with impacted appearing feces at the rectum suggesting constipation. Marked sigmoid colon air distension, chronic finding. 2. Cholelithiasis. 3. Possible hyperdense exophytic lesion lower pole left kidney measuring 13 mm. Recommend further evaluation with nonemergent renal ultrasound. 4.  Partial right lower lobe consolidation with bronchiectasis, possible pneumonia. 5. Aortic atherosclerosis. Aortic Atherosclerosis (ICD10-I70.0). Electronically Signed: By: Esmeralda Hedge M.D. On: 09/12/2023 16:24   CT HEAD WO CONTRAST ( ) Result Date: 09/12/2023 CLINICAL DATA:  Head trauma. EXAM: CT HEAD WITHOUT CONTRAST TECHNIQUE: Contiguous axial images were obtained from the base of the skull through the vertex without intravenous contrast. RADIATION DOSE REDUCTION: This exam was performed according to the departmental dose-optimization program which includes automated exposure control, adjustment of the mA and/or kV according to patient size and/or use of iterative reconstruction technique. COMPARISON:  CT of the head dated June 06, 2023. FINDINGS: Brain: There is generalized cerebral and cerebellar volume loss present. There are focal encephalomalacia changes within the left frontal lobe related to remote cortical infarct. There is mild to moderate periventricular white matter disease. There is no evidence of acute intracranial injury. No evidence of hemorrhage, mass or hydrocephalus. Vascular: Moderate calcifications within the carotid siphons. Skull: The calvarium is intact and unremarkable. There is basilar invagination, as before. Sinuses/Orbits: The visualized paranasal sinuses and mastoid air cells are clear. The orbits are unremarkable. Other: None. IMPRESSION: 1. No evidence of acute intracranial injury. 2. 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 Electronically Signed   By: Maribeth Shivers M.D.   On: 09/12/2023 16:14   DG Chest Portable 1 View Result Date: 09/12/2023 CLINICAL DATA:  sob EXAM: PORTABLE CHEST - 1 VIEW COMPARISON:  July 03, 2023 FINDINGS: Low lung volumes. No focal airspace consolidation, pleural effusion, or pneumothorax. Unchanged cardiomegaly. Mitral valve clips. Tortuous aorta with aortic atherosclerosis. no acute fracture or  destructive lesion. Osteopenia. Multilevel thoracic osteophytosis. Diffuse gaseous distension of the transverse colon. IMPRESSION: 1. Cardiomegaly with low lung volumes. Bilateral perihilar interstitial opacities, likely bronchovascular crowding from low lung volumes. Alternatively, developing interstitial edema could have this appearance. 2. Gaseous distention of the transverse colon. Electronically Signed   By: Rance Burrows M.D.   On: 09/12/2023 15:26    Labs: Basic Metabolic  Panel: Recent Labs  Lab 09/12/23 1345 09/13/23 0903 09/14/23 0522 09/15/23 0347 09/16/23 0329  NA 134* 135 135 139 139  K 4.0 4.2 3.3* 3.8 3.8  CL 97* 96* 99 102 103  CO2 26 24 25 28 26   GLUCOSE 162* 231* 161* 143* 146*  BUN 36* 48* 49* 44* 49*  CREATININE 1.24 1.58* 1.16 1.00 0.99  CALCIUM  9.6 9.7 9.3 9.5 9.2   CBC: Recent Labs  Lab 09/12/23 1345 09/13/23 0903  WBC 14.1* 10.3  NEUTROABS 11.7*  --   HGB 13.0 12.5*  HCT 39.8 37.1*  MCV 93.9 91.8  PLT 268 270   Microbiology: Results for orders placed or performed during the hospital encounter of 09/12/23  Blood culture (routine x 2)     Status: None   Collection Time: 09/12/23  5:46 PM   Specimen: BLOOD  Result Value Ref Range Status   Specimen Description BLOOD BLOOD LEFT FOREARM  Final   Special Requests   Final    BOTTLES DRAWN AEROBIC AND ANAEROBIC Blood Culture results may not be optimal due to an inadequate volume of blood received in culture bottles   Culture   Final    NO GROWTH 5 DAYS Performed at [facility], [address] [place], [place] [address]    Report Status 09/17/2023 FINAL  Final  Blood culture (routine x 2)     Status: None   Collection Time: 09/12/23  5:46 PM   Specimen: BLOOD  Result Value Ref Range Status   Specimen Description BLOOD LEFT ANTECUBITAL  Final   Special Requests   Final    BOTTLES DRAWN AEROBIC AND ANAEROBIC Blood Culture results may not be optimal due to an inadequate volume of blood  received in culture bottles   Culture   Final    NO GROWTH 5 DAYS Performed at [facility], [address] [place], [place] [address]    Report Status 09/17/2023 FINAL  Final    Time coordinating discharge: Over 30 minutes  Ree Candy, MD  [facility] 09/17/2023, 3:19 PM
